# Patient Record
Sex: Male | Born: 1950
Health system: Southern US, Community
[De-identification: ages and names within clinical notes are randomized; demographics above are authoritative.]

## PROBLEM LIST (undated history)

## (undated) DIAGNOSIS — J9691 Respiratory failure, unspecified with hypoxia: Secondary | ICD-10-CM

## (undated) DIAGNOSIS — Z942 Lung transplant status: Secondary | ICD-10-CM

## (undated) DIAGNOSIS — IMO0001 Reserved for inherently not codable concepts without codable children: Secondary | ICD-10-CM

## (undated) DIAGNOSIS — E119 Type 2 diabetes mellitus without complications: Secondary | ICD-10-CM

## (undated) DIAGNOSIS — M1990 Unspecified osteoarthritis, unspecified site: Secondary | ICD-10-CM

## (undated) DIAGNOSIS — G4733 Obstructive sleep apnea (adult) (pediatric): Secondary | ICD-10-CM

## (undated) DIAGNOSIS — J189 Pneumonia, unspecified organism: Secondary | ICD-10-CM

## (undated) DIAGNOSIS — E785 Hyperlipidemia, unspecified: Secondary | ICD-10-CM

## (undated) DIAGNOSIS — K449 Diaphragmatic hernia without obstruction or gangrene: Secondary | ICD-10-CM

## (undated) DIAGNOSIS — N186 End stage renal disease: Secondary | ICD-10-CM

## (undated) DIAGNOSIS — I359 Nonrheumatic aortic valve disorder, unspecified: Secondary | ICD-10-CM

## (undated) DIAGNOSIS — I10 Essential (primary) hypertension: Secondary | ICD-10-CM

## (undated) DIAGNOSIS — K219 Gastro-esophageal reflux disease without esophagitis: Secondary | ICD-10-CM

## (undated) DIAGNOSIS — Z992 Dependence on renal dialysis: Secondary | ICD-10-CM

## (undated) DIAGNOSIS — IMO0002 Reserved for concepts with insufficient information to code with codable children: Secondary | ICD-10-CM

## (undated) DIAGNOSIS — N289 Disorder of kidney and ureter, unspecified: Secondary | ICD-10-CM

## (undated) DIAGNOSIS — J8410 Pulmonary fibrosis, unspecified: Secondary | ICD-10-CM

## (undated) DIAGNOSIS — D126 Benign neoplasm of colon, unspecified: Secondary | ICD-10-CM

## (undated) HISTORY — DX: Obstructive sleep apnea (adult) (pediatric): G47.33

## (undated) HISTORY — DX: Benign neoplasm of colon, unspecified: D12.6

## (undated) HISTORY — PX: LUNG TRANSPLANT, SINGLE: SHX705

## (undated) HISTORY — DX: Reserved for concepts with insufficient information to code with codable children: IMO0002

## (undated) HISTORY — DX: Diaphragmatic hernia without obstruction or gangrene: K44.9

## (undated) HISTORY — DX: Nonrheumatic aortic valve disorder, unspecified: I35.9

## (undated) HISTORY — DX: Unspecified osteoarthritis, unspecified site: M19.90

## (undated) HISTORY — DX: Essential (primary) hypertension: I10

## (undated) HISTORY — DX: Hyperlipidemia, unspecified: E78.5

## (undated) HISTORY — DX: Gastro-esophageal reflux disease without esophagitis: K21.9

---

## 2001-02-21 ENCOUNTER — Ambulatory Visit (HOSPITAL_BASED_OUTPATIENT_CLINIC_OR_DEPARTMENT_OTHER): Admission: RE | Admit: 2001-02-21 | Discharge: 2001-02-21 | Payer: Self-pay | Admitting: Family Medicine

## 2001-04-16 ENCOUNTER — Ambulatory Visit (HOSPITAL_BASED_OUTPATIENT_CLINIC_OR_DEPARTMENT_OTHER): Admission: RE | Admit: 2001-04-16 | Discharge: 2001-04-16 | Payer: Self-pay | Admitting: Family Medicine

## 2003-10-28 ENCOUNTER — Emergency Department (HOSPITAL_COMMUNITY): Admission: EM | Admit: 2003-10-28 | Discharge: 2003-10-28 | Payer: Self-pay

## 2005-08-19 ENCOUNTER — Encounter: Payer: Self-pay | Admitting: Pulmonary Disease

## 2008-06-06 HISTORY — PX: LUNG BIOPSY: SHX232

## 2008-09-11 ENCOUNTER — Encounter: Payer: Self-pay | Admitting: Pulmonary Disease

## 2008-09-17 ENCOUNTER — Encounter: Payer: Self-pay | Admitting: Pulmonary Disease

## 2008-09-22 ENCOUNTER — Encounter: Admission: RE | Admit: 2008-09-22 | Discharge: 2008-09-22 | Payer: Self-pay | Admitting: Family Medicine

## 2008-10-09 DIAGNOSIS — M1990 Unspecified osteoarthritis, unspecified site: Secondary | ICD-10-CM | POA: Insufficient documentation

## 2008-10-09 DIAGNOSIS — G4733 Obstructive sleep apnea (adult) (pediatric): Secondary | ICD-10-CM | POA: Insufficient documentation

## 2008-10-09 DIAGNOSIS — I10 Essential (primary) hypertension: Secondary | ICD-10-CM | POA: Insufficient documentation

## 2008-10-09 DIAGNOSIS — K219 Gastro-esophageal reflux disease without esophagitis: Secondary | ICD-10-CM | POA: Insufficient documentation

## 2008-10-09 DIAGNOSIS — D126 Benign neoplasm of colon, unspecified: Secondary | ICD-10-CM

## 2008-10-09 DIAGNOSIS — E785 Hyperlipidemia, unspecified: Secondary | ICD-10-CM

## 2008-10-09 DIAGNOSIS — IMO0002 Reserved for concepts with insufficient information to code with codable children: Secondary | ICD-10-CM | POA: Insufficient documentation

## 2008-10-09 DIAGNOSIS — K449 Diaphragmatic hernia without obstruction or gangrene: Secondary | ICD-10-CM | POA: Insufficient documentation

## 2008-10-09 DIAGNOSIS — I359 Nonrheumatic aortic valve disorder, unspecified: Secondary | ICD-10-CM | POA: Insufficient documentation

## 2008-10-10 ENCOUNTER — Ambulatory Visit: Payer: Self-pay | Admitting: Pulmonary Disease

## 2008-10-10 DIAGNOSIS — J8410 Pulmonary fibrosis, unspecified: Secondary | ICD-10-CM

## 2008-10-29 ENCOUNTER — Encounter: Payer: Self-pay | Admitting: Pulmonary Disease

## 2008-11-06 ENCOUNTER — Ambulatory Visit: Payer: Self-pay | Admitting: Pulmonary Disease

## 2009-02-18 ENCOUNTER — Telehealth: Payer: Self-pay | Admitting: Pulmonary Disease

## 2009-02-25 ENCOUNTER — Ambulatory Visit: Payer: Self-pay | Admitting: Pulmonary Disease

## 2009-02-26 ENCOUNTER — Encounter: Payer: Self-pay | Admitting: Pulmonary Disease

## 2009-03-11 ENCOUNTER — Ambulatory Visit: Payer: Self-pay | Admitting: Thoracic Surgery

## 2009-03-17 ENCOUNTER — Encounter: Admission: RE | Admit: 2009-03-17 | Discharge: 2009-03-17 | Payer: Self-pay | Admitting: Thoracic Surgery

## 2009-04-03 ENCOUNTER — Ambulatory Visit: Payer: Self-pay | Admitting: Thoracic Surgery

## 2009-04-07 ENCOUNTER — Encounter: Payer: Self-pay | Admitting: Thoracic Surgery

## 2009-04-07 ENCOUNTER — Ambulatory Visit: Payer: Self-pay | Admitting: Thoracic Surgery

## 2009-04-07 ENCOUNTER — Inpatient Hospital Stay (HOSPITAL_COMMUNITY): Admission: RE | Admit: 2009-04-07 | Discharge: 2009-04-11 | Payer: Self-pay | Admitting: Thoracic Surgery

## 2009-04-07 ENCOUNTER — Ambulatory Visit: Payer: Self-pay | Admitting: Emergency Medicine

## 2009-04-07 ENCOUNTER — Encounter: Payer: Self-pay | Admitting: Pulmonary Disease

## 2009-04-21 ENCOUNTER — Encounter: Admission: RE | Admit: 2009-04-21 | Discharge: 2009-04-21 | Payer: Self-pay | Admitting: Thoracic Surgery

## 2009-04-21 ENCOUNTER — Ambulatory Visit: Payer: Self-pay | Admitting: Thoracic Surgery

## 2009-04-28 ENCOUNTER — Ambulatory Visit: Payer: Self-pay | Admitting: Pulmonary Disease

## 2009-04-28 LAB — CONVERTED CEMR LAB
Anti Nuclear Antibody(ANA): NEGATIVE
Sed Rate: 43 mm/hr — ABNORMAL HIGH (ref 0–22)

## 2009-06-03 ENCOUNTER — Ambulatory Visit: Payer: Self-pay | Admitting: Thoracic Surgery

## 2009-06-03 ENCOUNTER — Encounter: Admission: RE | Admit: 2009-06-03 | Discharge: 2009-06-03 | Payer: Self-pay | Admitting: Thoracic Surgery

## 2009-10-26 ENCOUNTER — Ambulatory Visit: Payer: Self-pay | Admitting: Pulmonary Disease

## 2009-10-26 DIAGNOSIS — J8410 Pulmonary fibrosis, unspecified: Secondary | ICD-10-CM

## 2009-10-27 ENCOUNTER — Ambulatory Visit: Payer: Self-pay | Admitting: Pulmonary Disease

## 2009-11-06 ENCOUNTER — Telehealth: Payer: Self-pay | Admitting: Pulmonary Disease

## 2010-07-06 NOTE — Progress Notes (Signed)
Summary: pft results  Phone Note Call from Patient Call back at [phone]   Caller: Patient Call For: Latressa Harries Summary of Call: Returning Megan's call. Initial call taken by: Netta Neat,  November 06, 2009 9:18 AM  Follow-up for Phone Call        [facility]. [place] Bing CMA  November 06, 2009 9:34 AM  pt returned call from triage. Cooper Render, CNA  November 06, 2009 11:06 AM  pt advised of pft results per append. [place] Bing CMA  November 06, 2009 11:08 AM

## 2010-07-06 NOTE — Miscellaneous (Signed)
Summary: Orders Update pft charges  Clinical Lists Changes  Orders: Added new Service order of Carbon Monoxide diffusing w/capacity (94720) - Signed Added new Service order of Lung Volumes (94240) - Signed Added new Service order of Spirometry (Pre & Post) (94060) - Signed 

## 2010-07-06 NOTE — Assessment & Plan Note (Signed)
Summary: rov for pulmonary fibrosis   Copy to:  Arlyce Dice Primary Provider/Referring Provider:  Aura Dials  CC:  Pt is here for a 6 month f/u appt.  Pt states breathing is unchanged from last visit.  Pt states he will occ cough up scant amount of clear sputum.  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  He is doing very well, with no change in his exertional tolerance.  He is staying very active.  He has minimal dry cough, and no congestion.  He denies LE edema  Current Medications (verified): 1)  Omeprazole 40 Mg Cpdr (Omeprazole) .... Take 1 Tablet By Mouth Once A Day As Needed 2)  Exforge Hct 5-160-25 Mg Tabs (Amlodipine-Valsartan-Hctz) .... Take 1 Tablet By Mouth Once A Day 3)  Simvastatin 40 Mg Tabs (Simvastatin) .... Take 1 Tablet By Mouth Once A Day 4)  Aspirin Low Dose 81 Mg Tabs (Aspirin) .... Take 1 Tablet By Mouth Once A Day 5)  Multivitamins  Tabs (Multiple Vitamin) .... Take 1 Tablet By Mouth Once A Day 6)  Vitamin D 1000 Unit Tabs (Cholecalciferol) .... Take 1 Tablet By Mouth Once A Day  Allergies (verified): 1)  ! Levaquin  Review of Systems       The patient complains of shortness of breath with activity and non-productive cough.  The patient denies shortness of breath at rest, productive cough, coughing up blood, chest pain, irregular heartbeats, acid heartburn, indigestion, loss of appetite, weight change, abdominal pain, difficulty swallowing, sore throat, tooth/dental problems, headaches, nasal congestion/difficulty breathing through nose, sneezing, itching, ear ache, anxiety, depression, hand/feet swelling, joint stiffness or pain, rash, change in color of mucus, and fever.    Vital Signs:  Patient profile:   60 year old male Height:      63 inches Weight:      171 pounds BMI:     30.40 O2 Sat:      98 % on Room air Temp:     98.0 degrees F oral Pulse rate:   74 / minute BP sitting:   164 / 98   (left arm) Cuff size:   regular  Vitals Entered By: Matthew Folks LPN (May 23, 624THL 579FGE AM)  O2 Flow:  Room air CC: Pt is here for a 6 month f/u appt.  Pt states breathing is unchanged from last visit.  Pt states he will occ cough up scant amount of clear sputum.   Comments Medications reviewed with patient Matthew Folks LPN  May 23, 624THL 579FGE AM    Physical Exam  General:  wd male in nad Lungs:  crackles in bases, no wheezing Heart:  rrr, no mrg Extremities:  no edema or cyanosis Neurologic:  alert and oriented,moves all 4.   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  I have talked with him again about various treatment options, including surveillance, investigational studies, poorly effective treatment with prednisone/imuran, and transplant.  He is doing very well currently, and has not seen a decline in his exertional tolerance.  He would like to continue with surveillance for now, and will continue with yearly cxr/pfts to monitor for clinical worsening.  Medications Added to Medication List This Visit: 1)  Omeprazole 40 Mg Cpdr (Omeprazole) .... Take 1 tablet by mouth once a day as needed 2)  Vitamin D  1000 Unit Tabs (Cholecalciferol) .... Take 1 tablet by mouth once a day  Other Orders: Est. Patient Level III [id number]) T-2 View CXR ([id number]) Pulmonary Referral (Pulmonary)  Patient Instructions: 1)  will check cxr today, and let you know the results. 2)  will schedule for pfts at your convenience 3)  continue to stay active, let me know if symptoms worsen 4)  followup with me in 22mos  Appended Document: rov for pulmonary fibrosis megan, please let pt know there has been only a small decline in lung function from last year...will continue to follow.  Appended Document: rov for pulmonary fibrosis [id number]  Appended Document: rov for pulmonary  fibrosis pt advised.

## 2010-09-08 LAB — CBC
HCT: 40.9 % (ref 39.0–52.0)
HCT: 42.7 % (ref 39.0–52.0)
MCHC: 34.2 g/dL (ref 30.0–36.0)
MCV: 87.3 fL (ref 78.0–100.0)
MCV: 87.8 fL (ref 78.0–100.0)
Platelets: 267 10*3/uL (ref 150–400)
RBC: 4.69 MIL/uL (ref 4.22–5.81)
RDW: 14.6 % (ref 11.5–15.5)
WBC: 10.1 10*3/uL (ref 4.0–10.5)

## 2010-09-08 LAB — FUNGUS CULTURE W SMEAR

## 2010-09-08 LAB — BASIC METABOLIC PANEL
BUN: 12 mg/dL (ref 6–23)
Chloride: 101 mEq/L (ref 96–112)
Glucose, Bld: 183 mg/dL — ABNORMAL HIGH (ref 70–99)
Potassium: 3.4 mEq/L — ABNORMAL LOW (ref 3.5–5.1)

## 2010-09-08 LAB — COMPREHENSIVE METABOLIC PANEL
AST: 55 U/L — ABNORMAL HIGH (ref 0–37)
BUN: 9 mg/dL (ref 6–23)
CO2: 27 mEq/L (ref 19–32)
Calcium: 8.8 mg/dL (ref 8.4–10.5)
Creatinine, Ser: 1.16 mg/dL (ref 0.4–1.5)
GFR calc Af Amer: >60 mL/min (ref >60)
GFR calc non Af Amer: >60 mL/min (ref >60)
Glucose, Bld: 152 mg/dL — ABNORMAL HIGH (ref 70–99)

## 2010-09-08 LAB — BLOOD GAS, ARTERIAL
Acid-Base Excess: 0.5 mmol/L (ref 0.0–2.0)
FIO2: 0.21 %
Patient temperature: 98.6
TCO2: 26.1 mmol/L (ref 0–100)
pCO2 arterial: 41.3 mmHg (ref 35.0–45.0)
pH, Arterial: 7.397 (ref 7.350–7.450)

## 2010-09-08 LAB — AFB CULTURE WITH SMEAR (NOT AT ARMC): Acid Fast Smear: NONE SEEN

## 2010-09-09 LAB — TYPE AND SCREEN: Antibody Screen: NEGATIVE

## 2010-09-09 LAB — CBC
HCT: 47.2 % (ref 39.0–52.0)
Hemoglobin: 15.9 g/dL (ref 13.0–17.0)
MCV: 87.7 fL (ref 78.0–100.0)
Platelets: 283 10*3/uL (ref 150–400)
WBC: 7.3 10*3/uL (ref 4.0–10.5)

## 2010-09-09 LAB — APTT: aPTT: 38 seconds — ABNORMAL HIGH (ref 24–37)

## 2010-09-09 LAB — COMPREHENSIVE METABOLIC PANEL
Albumin: 3.8 g/dL (ref 3.5–5.2)
Alkaline Phosphatase: 78 U/L (ref 39–117)
BUN: 12 mg/dL (ref 6–23)
Chloride: 105 mEq/L (ref 96–112)
Creatinine, Ser: 1.12 mg/dL (ref 0.4–1.5)
GFR calc non Af Amer: >60 mL/min (ref >60)
Glucose, Bld: 97 mg/dL (ref 70–99)
Potassium: 4.3 mEq/L (ref 3.5–5.1)
Total Bilirubin: 0.8 mg/dL (ref 0.3–1.2)

## 2010-09-09 LAB — URINALYSIS, ROUTINE W REFLEX MICROSCOPIC
Bilirubin Urine: NEGATIVE
Hgb urine dipstick: NEGATIVE
Ketones, ur: NEGATIVE mg/dL
Nitrite: NEGATIVE
Protein, ur: NEGATIVE mg/dL
Specific Gravity, Urine: 1.018 (ref 1.005–1.030)
Urobilinogen, UA: 1 mg/dL (ref 0.0–1.0)

## 2010-09-09 LAB — PROTIME-INR
INR: 0.92 (ref 0.00–1.49)
Prothrombin Time: 12.3 seconds (ref 11.6–15.2)

## 2010-10-19 NOTE — Letter (Signed)
April 21, 2009   Kathee Delton, MD, FCCP  [address], [place] [address]   Re:  MIKIE, CAVENY             DOB:  1951-01-26   Dear Lanny Hurst,   I saw the patient back.  We removed his sutures.  His incisions are well  healed.  He is breathing much better than he was previously.  His blood  pressure was up 182/67, pulse was 78, respirations 18, sats were 93%.  Overall, he is was doing satisfactory.  We plan to return him on  April 24, 2009.  I did give him an appointment to see Dr. Gwenette Greet.  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  I did  explain to him that this carried with him a very poor prognosis and that  he would probably need to be referred to [facility] for consideration for  possible transplantation.   Nicanor Alcon, M.D.  Electronically Signed   DPB/MEDQ  D:  04/21/2009  T:  04/22/2009  Job:  [id number]

## 2010-10-19 NOTE — Letter (Signed)
March 11, 2009   Kathee Delton, MD,FCCP  [address], [place] [address]   Re:  [name], [name]             DOB:  [birth year]   Dear Lanny Hurst,   I appreciate the opportunity of seeing the patient.  This 60 year old  African American male who works for [employer] has had progressive shortness  of breath and had a CT scan done in April after abnormal chest x-ray  that showed evidence of interstitial lung disease, right greater than  left.  He was continuing to get shortness of breath with exertion.  Followup chest x-ray showed no major change since April, and he is  referred here for evaluation for evaluation for a VATS lung biopsy,  which we discussed in great detail.   PAST MEDICAL HISTORY:   MEDICATIONS:  Aspirin 81 mg daily, simvastatin 40 mg a day, omeprazole  20 mg daily, and Exforge/HCTZ 5-160-25 daily.   ALLERGIES:  He is allergic to Levaquin.   FAMILY HISTORY:  Noncontributory.  Although, he did have a father who  had some amount of scarring on his lungs.   SOCIAL HISTORY:  She is married, has 1 child.  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  Quit smoking few years ago.  He does not drink alcohol on  a regular basis.   REVIEW OF SYSTEMS:  CONSTITUTIONAL:  He is 170 pounds, he is 5 foot 3.  In general, his weight has been stable.  CARDIAC:  He has shortness of breath with exertion.  No angina or atrial  fibrillation.  PULMONARY:  No hemoptysis, bronchitis, asthma, or wheezing.  GI:  He has GERD and has a lot of some dysphagia.  GU:  No kidney disease, dysuria, or frequent urination.  VASCULAR:  No claudication, DVT, TIA's.  NEUROLOGIC:  No dizziness, headaches, blackouts, or seizures.  MUSCULOSKELETAL:  He has some low back pain and neck pain.  They was  told this was arthritis.  PSYCHIATRIC:  No depression or nervous.  EYE/ENT:  No change in his eyesight or hearing but has some ringing in  his ears.  HEMATOLOGIC:  No problems with bleeding, clotting disorders,  or anemia.   PHYSICAL EXAMINATION:  General:  His blood pressure is 200/100, pulse  76, respirations 18, sats were 94%.  Head, Eyes, Ears, Nose, and Throat:  Unremarkable.  Neck:  Supple without thyromegaly.  There is no  supraclavicular or axillary adenopathy.  Chest:  Clear to auscultation  or percussion.  Heart:  Regular sinus rhythm.  No murmurs.  Abdomen:  Soft.  There is no hepatosplenomegaly.  Extremities:  Pulses 2+.  There  is no clubbing or edema.  Neurologic:  He is oriented x3.  Sensory and  motor intact.  Cranial nerves intact.   I feel that he does have interstitial lung disease.  I will plan to  order another CT scan, so I can have a recent CT scan to decide on which  side to do this biopsies right now.  From his chest x-ray, I will  considering doing a right VATS and lung biopsy.  We will generally  schedule this for later in the month.  I discussed all the risk and  benefits of the biopsies and adequately they understand the risk and the  benefits.   I appreciate the opportunity of seeing the patient.    Sincerely,   Nicanor Alcon, M.D.  Electronically Signed   DPB/MEDQ  D:  03/11/2009  T:  03/12/2009  Job:  [id number]

## 2010-10-25 ENCOUNTER — Encounter: Payer: Self-pay | Admitting: Pulmonary Disease

## 2010-11-02 ENCOUNTER — Encounter: Payer: Self-pay | Admitting: Pulmonary Disease

## 2010-11-02 ENCOUNTER — Ambulatory Visit (INDEPENDENT_AMBULATORY_CARE_PROVIDER_SITE_OTHER): Payer: Managed Care, Other (non HMO) | Admitting: Pulmonary Disease

## 2010-11-02 DIAGNOSIS — J84111 Idiopathic interstitial pneumonia, not otherwise specified: Secondary | ICD-10-CM

## 2010-11-02 NOTE — Patient Instructions (Signed)
pick up a disc with your cxr and bring by for my review. Will setup for breathing studies, and will call you with results.  followup with me in 81mos.

## 2010-11-02 NOTE — Progress Notes (Signed)
  Subjective:    Patient ID: [name], male    DOB: November 16, 1950, 60 y.o.   MRN: [id number]  HPI The pt comes in today for f/u of his known PF.  He feels his exertional tolerance is near his usual baseline, and is trying to stay as active as possible.  He has a mild cough, worse at night, that sounds more due to PND than anything else.  I have asked him to be more consistent with his otc allergy meds.    Review of Systems  Constitutional: Negative for fever and unexpected weight change.  HENT: Positive for congestion, sore throat, sneezing and sinus pressure. Negative for ear pain, nosebleeds, rhinorrhea, trouble swallowing, dental problem and postnasal drip.   Eyes: Negative for redness and itching.  Respiratory: Positive for cough, chest tightness, shortness of breath and wheezing.   Cardiovascular: Positive for palpitations and leg swelling.  Gastrointestinal: Negative for nausea and vomiting.  Genitourinary: Negative for dysuria.  Musculoskeletal: Positive for joint swelling.  Skin: Negative for rash.  Neurological: Positive for headaches.  Hematological: Does not bruise/bleed easily.  Psychiatric/Behavioral: Negative for dysphoric mood. The patient is not nervous/anxious.        Objective:   Physical Exam Wd male in nad Chest with crackles in bases about 1/3 up bilat.  No wheezing noted.  Cor with rrr LE without edema, no cyanosis noted.  Alert, oriented, moves all 4        Assessment & Plan:

## 2010-11-02 NOTE — Assessment & Plan Note (Addendum)
The pt sounds fairly stable from his last visit at least subjectively.  He needs to have his f/u cxr and pfts to compare to last year. If he has further objective decline, would consider repeating his autoimmune panel, and referring to ASCEND trial if he qualifies.

## 2010-11-10 ENCOUNTER — Telehealth: Payer: Self-pay | Admitting: Pulmonary Disease

## 2010-11-10 ENCOUNTER — Encounter: Payer: Self-pay | Admitting: Pulmonary Disease

## 2010-11-10 ENCOUNTER — Ambulatory Visit (INDEPENDENT_AMBULATORY_CARE_PROVIDER_SITE_OTHER): Payer: Managed Care, Other (non HMO) | Admitting: Pulmonary Disease

## 2010-11-10 VITALS — BP 128/86 | HR 80 | Temp 98.3°F | Ht 63.0 in | Wt 176.8 lb

## 2010-11-10 DIAGNOSIS — R05 Cough: Secondary | ICD-10-CM

## 2010-11-10 DIAGNOSIS — R059 Cough, unspecified: Secondary | ICD-10-CM | POA: Insufficient documentation

## 2010-11-10 MED ORDER — LORATADINE 10 MG PO TABS: 10.0000 mg | ORAL_TABLET | Freq: Every day | ORAL | Status: DC

## 2010-11-10 MED ORDER — MONTELUKAST SODIUM 10 MG PO TABS: ORAL_TABLET | ORAL | Status: DC

## 2010-11-10 MED ORDER — FLUTICASONE PROPIONATE 50 MCG/ACT NA SUSP: 1.0000 | Freq: Every day | NASAL | Status: DC

## 2010-11-10 MED ORDER — DIPHENHYDRAMINE HCL 25 MG PO CAPS: ORAL_CAPSULE | ORAL | Status: DC

## 2010-11-10 NOTE — Progress Notes (Signed)
Subjective:    Patient ID: [name], male    DOB: 1951-05-11, 60 y.o.   MRN: [id number]  HPI 60 yo male with worsening cough and rhinitis in setting of pulmonary fibrosis.  He was change from claritin to zyrtec.  He sinuses and cough got worse.  He has post-nasal drip and globus sensation.  He has a cough with clear sputum.  He denies fever, wheeze, hemoptysis, ear pain, nose bleed, or reflux.  He has not had sick exposure.  He has been using nasal rinse and flonase.  He felt that claritin worked better.  CXR from April 2012 reviewed and showed no change in fibrosis.  Past Medical History  Diagnosis Date  . Benign neoplasm of colon   . Aortic valve disorders   . Osteoarthrosis, unspecified whether generalized or localized, unspecified site   . Degeneration of intervertebral disc, site unspecified   . Other and unspecified hyperlipidemia   . Unspecified essential hypertension   . Obstructive sleep apnea (adult) (pediatric)   . Esophageal reflux   . 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 Pancreatic cancer Brother   . Heart disease Father      History   Social History  . Marital Status: Married    Spouse Name: N/A    Number of Children: N/A  . Years of Education: N/A   Occupational History  . [employer]    Social History Main Topics  . Smoking status: Former Smoker -- 0.3 packs/day for 20 years    Types: Cigarettes    Quit date: 06/06/2006  . Smokeless tobacco: Never Used  . Alcohol Use: No  . Drug Use: Not on file  . Sexually Active: Not on file   Other Topics Concern  . Not on file   Social History Narrative  . No narrative on file     Allergies  Allergen Reactions  . Levofloxacin      Outpatient Prescriptions Prior to Visit  Medication Sig Dispense Refill  . amLODipine-valsartan (EXFORGE) 5-160 MG per tablet Take 1 tablet by mouth daily.        Marland Kitchen aspirin 81 MG tablet  Take 81 mg by mouth daily.        . Cholecalciferol (VITAMIN D) 1000 UNITS capsule Take 1,000 Units by mouth daily.        . Multiple Vitamin (MULTIVITAMIN) capsule Take 1 capsule by mouth daily.        Marland Kitchen omeprazole (PRILOSEC) 40 MG capsule Take 40 mg by mouth daily.        . simvastatin (ZOCOR) 40 MG tablet Take 40 mg by mouth at bedtime.         Review of Systems     Objective:   Physical Exam  BP 128/86  Pulse 80  Temp(Src) 98.3 F (36.8 C) (Oral)  Ht 5\' 3"  (1.6 m)  Wt 176 lb 12.8 oz (80.196 kg)  BMI 31.32 kg/m2  SpO2 97%  General - Obese HEENT - clear sinus drainage, no sinus tenderness, mild erythema posterior pharynx, no oral exudate, no LAN Cardiac - s1s2 regular, no murmur Chest - basilar crackles, no wheeze Abd - soft, nontender Ext - no edema Neuro - normal strength     Assessment & Plan:   Cough Much of this is likely related to his sinuses with post-nasal drip.  Will have him resume claritin, and use benadryl at night for  5 days.  He is to continue sinus rinse and flonase.  Will have him also use singulair for one week, then as needed.  I don't think he needs an additional chest xray, antibiotics, or prednisone at this time.  Advised him to keep his appointment for PFT.    Updated Medication List Outpatient Encounter Prescriptions as of 11/10/2010  Medication Sig Dispense Refill  . amLODipine-valsartan (EXFORGE) 5-160 MG per tablet Take 1 tablet by mouth daily.        Marland Kitchen aspirin 81 MG tablet Take 81 mg by mouth daily.        . Cholecalciferol (VITAMIN D) 1000 UNITS capsule Take 1,000 Units by mouth daily.        . Multiple Vitamin (MULTIVITAMIN) capsule Take 1 capsule by mouth daily.        Marland Kitchen omeprazole (PRILOSEC) 40 MG capsule Take 40 mg by mouth daily.        . simvastatin (ZOCOR) 40 MG tablet Take 40 mg by mouth at bedtime.        . diphenhydrAMINE (BENADRYL) 25 mg capsule One pill at night for 5 days, then as needed  24 capsule  2  . fluticasone (FLONASE)  50 MCG/ACT nasal spray Place 1 spray into the nose daily.  16 g  2  . loratadine (CLARITIN) 10 MG tablet Take 1 tablet (10 mg total) by mouth daily.  30 tablet  2  . montelukast (SINGULAIR) 10 MG tablet One pill at night for one week, then as needed  30 tablet  2

## 2010-11-10 NOTE — Patient Instructions (Signed)
Stop zyrtec Claritin 10 mg daily in morning Benadryl 25 mg at night for 5 days, then as needed Singulair 10 mg at night for 7 days, then as needed Continue using sinus rinse and flonase Follow up with Dr. Gwenette Greet as previously scheduled

## 2010-11-10 NOTE — Assessment & Plan Note (Signed)
Much of this is likely related to his sinuses with post-nasal drip.  Will have him resume claritin, and use benadryl at night for 5 days.  He is to continue sinus rinse and flonase.  Will have him also use singulair for one week, then as needed.  I don't think he needs an additional chest xray, antibiotics, or prednisone at this time.  Advised him to keep his appointment for PFT.

## 2010-11-10 NOTE — Telephone Encounter (Signed)
Called, spoke with pt.  States he started zyrtec on 5/29.  The next morning he noticed SOB with exertion.  States he thought this was coming from the zyrtec so he stopped it on 5/31.  Pt feels SOB is getting worse and not improving.  OV scheduled for today with Dr. Halford Chessman at 3pm -- pt aware and advised to call 911 or go to ED if sxs worsen prior to OV -- he verbalized understanding of this.

## 2010-11-15 ENCOUNTER — Ambulatory Visit (INDEPENDENT_AMBULATORY_CARE_PROVIDER_SITE_OTHER): Payer: Managed Care, Other (non HMO) | Admitting: Pulmonary Disease

## 2010-11-15 DIAGNOSIS — J84111 Idiopathic interstitial pneumonia, not otherwise specified: Secondary | ICD-10-CM

## 2010-11-15 LAB — PULMONARY FUNCTION TEST

## 2010-11-15 NOTE — Progress Notes (Signed)
PFT done today. 

## 2010-11-19 ENCOUNTER — Telehealth: Payer: Self-pay | Admitting: Pulmonary Disease

## 2010-11-19 NOTE — Telephone Encounter (Signed)
Called and spoke with pt's wife.  Informed her Happy out of office this week and will return next week.  Wife ok to wait for response from [facility] .

## 2010-11-23 ENCOUNTER — Telehealth: Payer: Self-pay | Admitting: Pulmonary Disease

## 2010-11-23 NOTE — Telephone Encounter (Signed)
See phone note dated 11/23/10

## 2010-11-23 NOTE — Telephone Encounter (Signed)
Spoke with pt's spouse. She is concerned pt has not received his cxr and pft results. I advised will forward msg to [facility] for these results. Pls advise, thanks!

## 2010-11-24 NOTE — Telephone Encounter (Signed)
Let pt know that I have been out of town. Tell him his cxr is completely stable, and his lung size/capacity is actually better than last year.  One test that looks at how the oxygen gets from air sac into bloodstream is a little worse from last year.  Overall, everything is stable.  As long as he is doing ok, will stick with plan. If he feels his breathing is worsening, needs to call me.

## 2010-11-24 NOTE — Telephone Encounter (Signed)
Spoke with pt and notified of results per Moroni. Pt verbalized understanding and denied any questions. Pt aware [facility] was out of town last wk.

## 2011-05-04 ENCOUNTER — Encounter: Payer: Self-pay | Admitting: Pulmonary Disease

## 2011-05-04 ENCOUNTER — Ambulatory Visit (INDEPENDENT_AMBULATORY_CARE_PROVIDER_SITE_OTHER): Payer: Managed Care, Other (non HMO) | Admitting: Pulmonary Disease

## 2011-05-04 DIAGNOSIS — J84111 Idiopathic interstitial pneumonia, not otherwise specified: Secondary | ICD-10-CM

## 2011-05-04 DIAGNOSIS — J019 Acute sinusitis, unspecified: Secondary | ICD-10-CM | POA: Insufficient documentation

## 2011-05-04 MED ORDER — CEFDINIR 300 MG PO CAPS: ORAL_CAPSULE | ORAL | Status: DC

## 2011-05-04 NOTE — Assessment & Plan Note (Signed)
The patient's wife has noticed increasing cough most recently, and the patient is describing a lot of postnasal drip with discolored mucus production.  He is also having a lot of sinus congestion and discharge from his nose.  I suspect he is developing an acute sinusitis, and therefore will treat him with a course of antibiotics and more aggressive nasal hygiene.

## 2011-05-04 NOTE — Patient Instructions (Signed)
Try chlorpheniramine 8mg  at bedtime for your postnasal drip in the place of benedryl and zyrtec. Use salt water spray before bedtime to clean out nose.  Can do this during the day as well Will treat with omnicef for possible sinusitis. Will see you back in 17mos with chest xray and breathing tests the same day.

## 2011-05-04 NOTE — Progress Notes (Signed)
  Subjective:    Patient ID: [name], male    DOB: [birth year], 60 y.o.   MRN: [id number]  HPI The patient comes in today for followup of his known interstitial disease, most likely due to idiopathic pulmonary fibrosis.  His breathing has been at a stable baseline, and his pulmonary function studies overall have been stable.  He denies any significant chest congestion or worsening shortness of breath outside of his usual range.  He has noted increasing cough with discolored mucus and increased postnasal drip.  He has had sinus congestion and blood from his nose with discolored mucus as well.   Review of Systems  Constitutional: Negative for fever and unexpected weight change.  HENT: Positive for nosebleeds, congestion, rhinorrhea, sneezing and sinus pressure. Negative for ear pain, sore throat, trouble swallowing, dental problem and postnasal drip.   Eyes: Positive for itching. Negative for redness.  Respiratory: Positive for cough and shortness of breath. Negative for chest tightness and wheezing.   Cardiovascular: Negative for palpitations and leg swelling.  Gastrointestinal: Negative for nausea and vomiting.  Genitourinary: Negative for dysuria.  Musculoskeletal: Negative for joint swelling.  Skin: Negative for rash.  Neurological: Negative for headaches.  Hematological: Does not bruise/bleed easily.  Psychiatric/Behavioral: Negative for dysphoric mood. The patient is not nervous/anxious.        Objective:   Physical Exam Well-developed male in no acute distress Nose with edematous and erythematous mucosa, bleeding and crusting in the left nostril Oropharynx clear without exudates Chest with crackles one third of the way up bilaterally, no wheezing Cardiac with regular rate and rhythm Lower extremities with no edema, no cyanosis noted Alert and oriented, moves all 4 extremities.       Assessment & Plan:

## 2011-05-04 NOTE — Assessment & Plan Note (Signed)
The pt is stable from a pulmonary standpoint, and will therefore continue to closely monitor.  Will see back in 89mos with cxr and pfts.  He is to call if breathing worsens in the interim.

## 2011-06-03 ENCOUNTER — Ambulatory Visit (INDEPENDENT_AMBULATORY_CARE_PROVIDER_SITE_OTHER): Payer: Managed Care, Other (non HMO)

## 2011-06-03 DIAGNOSIS — R509 Fever, unspecified: Secondary | ICD-10-CM

## 2011-11-01 ENCOUNTER — Other Ambulatory Visit: Payer: Self-pay | Admitting: Pulmonary Disease

## 2011-11-01 DIAGNOSIS — J849 Interstitial pulmonary disease, unspecified: Secondary | ICD-10-CM

## 2011-11-02 ENCOUNTER — Ambulatory Visit (INDEPENDENT_AMBULATORY_CARE_PROVIDER_SITE_OTHER)
Admission: RE | Admit: 2011-11-02 | Discharge: 2011-11-02 | Disposition: A | Discharge status: Home / Self Care | Payer: Managed Care, Other (non HMO) | Source: Ambulatory Visit | Attending: Pulmonary Disease | Admitting: Pulmonary Disease

## 2011-11-02 ENCOUNTER — Ambulatory Visit (INDEPENDENT_AMBULATORY_CARE_PROVIDER_SITE_OTHER): Payer: Managed Care, Other (non HMO) | Admitting: Pulmonary Disease

## 2011-11-02 ENCOUNTER — Encounter: Payer: Self-pay | Admitting: Pulmonary Disease

## 2011-11-02 ENCOUNTER — Other Ambulatory Visit (INDEPENDENT_AMBULATORY_CARE_PROVIDER_SITE_OTHER): Payer: Managed Care, Other (non HMO)

## 2011-11-02 VITALS — BP 114/76 | HR 76 | Temp 97.8°F | Ht 64.0 in | Wt 179.0 lb

## 2011-11-02 DIAGNOSIS — J8410 Pulmonary fibrosis, unspecified: Secondary | ICD-10-CM

## 2011-11-02 DIAGNOSIS — J84111 Idiopathic interstitial pneumonia, not otherwise specified: Secondary | ICD-10-CM

## 2011-11-02 DIAGNOSIS — J849 Interstitial pulmonary disease, unspecified: Secondary | ICD-10-CM

## 2011-11-02 DIAGNOSIS — R05 Cough: Secondary | ICD-10-CM

## 2011-11-02 LAB — SEDIMENTATION RATE: Sed Rate: 27 mm/hr — ABNORMAL HIGH (ref 0–22)

## 2011-11-02 LAB — PULMONARY FUNCTION TEST

## 2011-11-02 LAB — CK TOTAL AND CKMB (NOT AT ARMC)
CK, MB: 1.5 ng/mL (ref 0.3–4.0)
Total CK: 124 U/L (ref 7–232)

## 2011-11-02 NOTE — Progress Notes (Signed)
  Subjective:    Patient ID: [name], male    DOB: 02/05/1951, 61 y.o.   MRN: [id number]  HPI The patient comes in today for followup of his known interstitial lung disease, with a history of UIP on biopsy.  He also has a history of a negative autoimmune panel in 2010.  The patient has had followup PFTs today which showed moderate to severe restriction, and clearly has worsened from his last set of studies.  His diffusion capacity is also severely reduced, and has worsened as well.  I have reviewed the studies with him and his wife, and answered all of their questions.  The patient has also seen an increase in his shortness of breath since the last visit.  He was started on inhalers by his primary physician, but does not feel they have helped.  He has no history of airflow obstruction by PFTs.   Review of Systems  Constitutional: Positive for fatigue. Negative for fever and unexpected weight change.  HENT: Negative.  Negative for ear pain, nosebleeds, congestion, sore throat, rhinorrhea, sneezing, trouble swallowing, dental problem, postnasal drip and sinus pressure.   Eyes: Negative.  Negative for redness and itching.  Respiratory: Positive for cough and shortness of breath. Negative for chest tightness and wheezing.   Cardiovascular: Negative.  Negative for palpitations and leg swelling.  Gastrointestinal: Negative.  Negative for nausea and vomiting.  Genitourinary: Negative.  Negative for dysuria.  Musculoskeletal: Negative.  Negative for joint swelling.  Skin: Negative.  Negative for rash.  Neurological: Negative.  Negative for headaches.  Hematological: Negative.  Does not bruise/bleed easily.  Psychiatric/Behavioral: Negative.  Negative for dysphoric mood. The patient is not nervous/anxious.        Objective:   Physical Exam Well-developed male in no acute distress Nose without purulence or discharge noted Oropharynx clear Chest with crackles one half the way up  bilaterally, but no wheezes Cardiac exam with regular rate and rhythm Lower extremities with no significant edema, no cyanosis Alert and oriented, moves all 4 extremities.       Assessment & Plan:

## 2011-11-02 NOTE — Progress Notes (Signed)
PFT done today. 

## 2011-11-02 NOTE — Assessment & Plan Note (Addendum)
The patient has worsening interstitial lung disease by his chest x-ray, as well as worsening restriction and DLCO on his pulmonary function studies.  He also has seen some clinical worsening over the last one year.  I have discussed with him possible treatment options, including referral to [facility] for participation in investigational trials.  He will think about this.  In the meantime, I have stressed to him the importance of weight loss and also improving his physical condition, if he ever wishes to be considered for lung transplantation if his lung disease worsens.  The patient also had desaturation with exertion today, and would benefit from exertional oxygen.  Will also check overnight oximetry to see if he needs oxygen at night.  The patient's lung biopsy was consistent with UIP, and his autoimmune panel was negative except for a slightly elevated rheumatoid factor.  I think we ought to repeat this again since it has been 3 years.  If his rheumatoid factor is significantly more positive, would consider referral to rheumatology.

## 2011-11-02 NOTE — Assessment & Plan Note (Signed)
The patient's cough sounds more upper airway in origin than anything else, and probably related to postnasal drip.  However, I cannot exclude a component related to his interstitial lung disease.  He is also on inhalers but does not have air flow obstruction, and we'll therefore discontinued these since they can worsen cough.

## 2011-11-02 NOTE — Patient Instructions (Addendum)
Will check autoimmune bloodwork again today, and will call you with results. Will check cxr at end of visit, and will call you with results. Consider investigational studies at [facility] if you are interested. Try chlorpheniramine 8mg  at bedtime in the place of zyrtec to see if helps with postnasal drip Stop all inhalers.  You do not have airways disease.  followup with me in 61mos, but call if having worsening symptoms.  Will need to start you on oxygen with exertional activities, and will also check your level at night while sleeping.

## 2011-11-03 ENCOUNTER — Telehealth: Payer: Self-pay | Admitting: Pulmonary Disease

## 2011-11-03 LAB — ANA: Anti Nuclear Antibody(ANA): NEGATIVE

## 2011-11-03 NOTE — Telephone Encounter (Signed)
I spoke with pt and he states his oxygen was brought out to him yesterday but was not told anything about doing ONO. Also he states the tech that set him up with the oxygen advised him he needed to be on oxygen 24/7. I spoke with Janett Billow from [place] and was told his Joselyn Arrow is scheduled to be done one Friday and he will also be getting the smaller tanks. I spoke with pt again and made him aware of this. He voiced his understanding and needed nothing further

## 2011-11-10 ENCOUNTER — Telehealth: Payer: Self-pay | Admitting: Pulmonary Disease

## 2011-11-10 NOTE — Telephone Encounter (Signed)
Pt is aware of ONO results and understands that he does not need to wear the oxygen with sleep as long as he is wearing the cpap. He will still need to wear the oxygen with exertion during the day.

## 2011-11-10 NOTE — Telephone Encounter (Signed)
Pt called to obtain results from his "O2 testing" and his labwork.  Pt can be reached today at [phone].  Satira Anis

## 2011-11-10 NOTE — Telephone Encounter (Signed)
ONO on cpap but off oxygen shows low sat of 87%.  He only spent 49min less than 89%.    Cecille Rubin, please let pt know he does not need oxygen at night as long as he wears his cpap.

## 2011-11-15 ENCOUNTER — Encounter: Payer: Self-pay | Admitting: Pulmonary Disease

## 2011-11-15 ENCOUNTER — Other Ambulatory Visit: Payer: Self-pay | Admitting: Pulmonary Disease

## 2011-11-15 DIAGNOSIS — R0600 Dyspnea, unspecified: Secondary | ICD-10-CM

## 2011-11-16 ENCOUNTER — Encounter: Payer: Self-pay | Admitting: Pulmonary Disease

## 2012-01-06 ENCOUNTER — Encounter: Payer: Self-pay | Admitting: Pulmonary Disease

## 2012-01-12 ENCOUNTER — Other Ambulatory Visit: Payer: Self-pay | Admitting: Family Medicine

## 2012-01-12 NOTE — Telephone Encounter (Signed)
Deny, needs ov

## 2012-03-16 ENCOUNTER — Encounter: Payer: Self-pay | Admitting: *Deleted

## 2012-05-04 ENCOUNTER — Ambulatory Visit (INDEPENDENT_AMBULATORY_CARE_PROVIDER_SITE_OTHER): Payer: Managed Care, Other (non HMO) | Admitting: Pulmonary Disease

## 2012-05-04 ENCOUNTER — Encounter: Payer: Self-pay | Admitting: Pulmonary Disease

## 2012-05-04 VITALS — BP 140/88 | HR 65 | Temp 97.6°F | Ht 63.0 in | Wt 172.0 lb

## 2012-05-04 DIAGNOSIS — G4733 Obstructive sleep apnea (adult) (pediatric): Secondary | ICD-10-CM

## 2012-05-04 DIAGNOSIS — J84111 Idiopathic interstitial pneumonia, not otherwise specified: Secondary | ICD-10-CM

## 2012-05-04 NOTE — Assessment & Plan Note (Signed)
The patient feels that he is stable since his last visit, but continues to have significant dyspnea on exertion.  I think it is very important that he work aggressively on weight loss, and also participate in a pulmonary rehabilitation program.  I have reminded him this is key to be considered for lung transplantation in the future.  I've also asked him to stay on oxygen with his exertional activities.  I would like to see him back in 6 months where he will be due for his PFTs and chest x-ray, but he is to call if he has worsening symptoms.  I have also reminded him that we can send him to [facility] for consideration of investigational trials.  He would like to hold off on this currently

## 2012-05-04 NOTE — Progress Notes (Signed)
  Subjective:    Patient ID: [name], male    DOB: 1950-11-29, 61 y.o.   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  He has been trying to stay as active as possible, and has not seen a change in his exertional tolerance since last visit.  He is wearing oxygen as needed with exertional activities during the day, and has had very little dry cough.  He has had a lot of postnasal drip which results in a cough with mucus in the morning but clears after an hour or so.  I've asked him to try a more aggressive nasal hygiene regimen.  He is also having issues with his CPAP, and feels that he is smothering when he first puts the device on.  He is not using his ramp button.    Review of Systems  Constitutional: Negative for fever and unexpected weight change.  HENT: Positive for nosebleeds, congestion, rhinorrhea, sneezing, postnasal drip and sinus pressure. Negative for ear pain, sore throat, trouble swallowing and dental problem.   Eyes: Negative for redness and itching.  Respiratory: Positive for cough and shortness of breath. Negative for chest tightness and wheezing.   Cardiovascular: Negative for palpitations and leg swelling.  Gastrointestinal: Negative for nausea and vomiting.  Genitourinary: Negative for dysuria.  Musculoskeletal: Negative for joint swelling.  Skin: Negative for rash.  Neurological: Negative for headaches.  Hematological: Does not bruise/bleed easily.  Psychiatric/Behavioral: Negative for dysphoric mood. The patient is not nervous/anxious.        Objective:   Physical Exam Overweight male in no acute distress Nose without purulence or discharge noted Neck without lymphadenopathy or thyromegaly Chest with basilar crackles one third the way up bilaterally, no wheezing Cardiac exam is regular rate and rhythm Lower extremities without edema, no cyanosis Alert  and oriented, moves all 4 extremities.       Assessment & Plan:

## 2012-05-04 NOTE — Patient Instructions (Addendum)
Will refer to pulmonary rehab at [facility] Will send an order to [employer] to show you how to use the ramp button on your machine Work on weight loss and conditioning. followup with me in 65mos.

## 2012-08-01 ENCOUNTER — Encounter: Payer: Self-pay | Admitting: *Deleted

## 2012-09-04 ENCOUNTER — Telehealth: Payer: Self-pay | Admitting: Pulmonary Disease

## 2012-09-04 NOTE — Telephone Encounter (Signed)
Patient aware that paperwork is complete and ready to be picked up. Patient to come pick up in the morning 09/05/12.

## 2012-09-04 NOTE — Telephone Encounter (Signed)
Form has been placed in [name] folder to address.

## 2012-09-04 NOTE — Telephone Encounter (Signed)
done

## 2012-10-04 ENCOUNTER — Ambulatory Visit (INDEPENDENT_AMBULATORY_CARE_PROVIDER_SITE_OTHER): Payer: Managed Care, Other (non HMO) | Admitting: Pulmonary Disease

## 2012-10-04 ENCOUNTER — Encounter: Payer: Self-pay | Admitting: Pulmonary Disease

## 2012-10-04 ENCOUNTER — Ambulatory Visit (INDEPENDENT_AMBULATORY_CARE_PROVIDER_SITE_OTHER)
Admission: RE | Admit: 2012-10-04 | Discharge: 2012-10-04 | Disposition: A | Discharge status: Home / Self Care | Payer: Managed Care, Other (non HMO) | Source: Ambulatory Visit | Attending: Pulmonary Disease | Admitting: Pulmonary Disease

## 2012-10-04 VITALS — BP 142/80 | HR 93 | Temp 97.3°F | Ht 63.0 in | Wt 177.0 lb

## 2012-10-04 DIAGNOSIS — J84111 Idiopathic interstitial pneumonia, not otherwise specified: Secondary | ICD-10-CM

## 2012-10-04 DIAGNOSIS — G4733 Obstructive sleep apnea (adult) (pediatric): Secondary | ICD-10-CM

## 2012-10-04 NOTE — Assessment & Plan Note (Signed)
The patient has a history of sleep apnea, and his currently having issues with his mask fit and also dryness.  He is not using his heated humidifier appropriately, and I have instructed him on how to do this. He is also having significant mask leak, and I will refer him to the sleep center for better fitting.  I have also encouraged him to work aggressively on weight loss.

## 2012-10-04 NOTE — Progress Notes (Signed)
  Subjective:    Patient ID: [name], male    DOB: 1950-07-10, 62 y.o.   MRN: [id number]  HPI The patient comes in today for followup of his idiopathic pulmonary fibrosis, as well as his obstructive sleep apnea.  He is wearing CPAP compliantly, however most recently began to have issues with mask leaks and also nasal congestion.  He has updated his mask cushions, but this continues to be an issue.  He is not aware of how to adjust the humidity on his humidifier.  He continues to have a lot of nasal symptoms with postnasal drip, but states that chlorpheniramine really helps with this.  He also feels that his breathing is not doing as well from an exertional standpoint, however he is not wearing his oxygen compliantly as I instructed with activities.  He was referred to pulmonary rehabilitation at the last visit, however has not heard from them.  He is due today for his chest x-ray and followup PFTs.   Review of Systems  Constitutional: Negative for fever and unexpected weight change.  HENT: Positive for congestion, rhinorrhea, sneezing, postnasal drip and sinus pressure. Negative for ear pain, nosebleeds, sore throat, trouble swallowing and dental problem.   Eyes: Positive for pain ( burning), redness and itching.       Eyes watering  Respiratory: Positive for cough, chest tightness and shortness of breath. Negative for wheezing.   Cardiovascular: Positive for chest pain and palpitations. Negative for leg swelling.  Gastrointestinal: Negative for nausea and vomiting.  Genitourinary: Negative for dysuria.  Musculoskeletal: Negative for joint swelling.  Skin: Negative for rash.  Neurological: Negative for headaches.  Hematological: Does not bruise/bleed easily.  Psychiatric/Behavioral: Negative for dysphoric mood. The patient is nervous/anxious.        Objective:   Physical Exam Well-developed male in no acute distress Nose without purulence or discharge noted No skin breakdown or  pressure necrosis from the CPAP mask Neck without lymphadenopathy or thyromegaly Chest with crackles one third of the way up bilaterally, no wheezing Cardiac exam with regular rate and rhythm Lower extremities without edema, no cyanosis Alert, does not appear to be sleepy, moves all 4 extremities.       Assessment & Plan:

## 2012-10-04 NOTE — Patient Instructions (Addendum)
Will resend referral to pulmonary rehab.  If you do not hear from them next week, let me know. Turn up heat on your cpap humidifier to get more moisture. Will send you to sleep center during the day for a mask fitting. Can try mucinex dm extra strength one in am and pm for a few weeks to see if helps mucus. Will check cxr today, and call you with results. Will schedule for breathing tests in next few weeks, and call you with results Wear your oxygen more consistently with exertional activities. Will let you know if you qualify for the upcoming drug study we discussed if you are interested followup with me in 33mos.

## 2012-10-04 NOTE — Assessment & Plan Note (Signed)
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  He has seen some decline in his exertional tolerance, but is not wearing his oxygen compliantly as I have discussed with him on numerous occasions.  He never interpreted into pulmonary rehabilitation, and we will send a referral again.  He is due for his followup chest x-ray and pulmonary function studies.  I also discussed with him the possibility of entering into the pirfenidone trial.  He continues to have a cough this sounds mostly upper airway and related to postnasal drip, but I cannot exclude the possibility of his interstitial disease being responsible.

## 2012-10-10 ENCOUNTER — Ambulatory Visit (HOSPITAL_BASED_OUTPATIENT_CLINIC_OR_DEPARTMENT_OTHER): Payer: Managed Care, Other (non HMO) | Attending: Pulmonary Disease | Admitting: Radiology

## 2012-10-10 DIAGNOSIS — G4733 Obstructive sleep apnea (adult) (pediatric): Secondary | ICD-10-CM

## 2012-10-10 DIAGNOSIS — Z9989 Dependence on other enabling machines and devices: Secondary | ICD-10-CM

## 2012-10-11 ENCOUNTER — Telehealth: Payer: Self-pay | Admitting: Pulmonary Disease

## 2012-10-11 NOTE — Telephone Encounter (Signed)
This letter will be given to [facility] in the morning d/t him being out of office this afternoon. Patient aware.

## 2012-10-12 ENCOUNTER — Telehealth: Payer: Self-pay | Admitting: Pulmonary Disease

## 2012-10-12 ENCOUNTER — Telehealth: Payer: Self-pay | Admitting: *Deleted

## 2012-10-12 NOTE — Telephone Encounter (Signed)
Form is on your desk Dr Gwenette Greet. Pt needs back ASAP. Thanks.

## 2012-10-12 NOTE — Telephone Encounter (Signed)
See message dated 10/11/12 This message will be closed and will continue on other message

## 2012-10-12 NOTE — Telephone Encounter (Signed)
Patient needing [employer] paperwork filled out for upcoming trip. LMOM x 1 to [facility]  Need to know: 1- How many batteries that patient has for his machine---must have per form, atleast 2 batteries 2- Airline confirmation number (six digits) 3- Flight number(s) with date of travel 4- Type of machine that patient has-- make, model, liter dose, company

## 2012-10-12 NOTE — Telephone Encounter (Signed)
Patient called back and verified the information needed for the form. States that he has another form at home that he has his portion filled out already on and he plans to just copy it all over to the form that [place] signs. This has been given to [facility] to sign and patient to come pick up on Monday.

## 2012-10-16 ENCOUNTER — Other Ambulatory Visit: Payer: Self-pay | Admitting: Gastroenterology

## 2012-10-17 ENCOUNTER — Telehealth (HOSPITAL_COMMUNITY): Payer: Self-pay | Admitting: *Deleted

## 2012-10-17 NOTE — Telephone Encounter (Signed)
Telephone call placed to patient, message left on answering machine requesting patient to return our call. Leverne Humbles RN

## 2012-10-31 ENCOUNTER — Encounter: Payer: Self-pay | Admitting: Pulmonary Disease

## 2012-11-07 ENCOUNTER — Ambulatory Visit (INDEPENDENT_AMBULATORY_CARE_PROVIDER_SITE_OTHER): Payer: Managed Care, Other (non HMO) | Admitting: Pulmonary Disease

## 2012-11-07 DIAGNOSIS — J84111 Idiopathic interstitial pneumonia, not otherwise specified: Secondary | ICD-10-CM

## 2012-11-07 LAB — PULMONARY FUNCTION TEST

## 2012-11-07 NOTE — Progress Notes (Signed)
PFT done today. 

## 2012-11-11 ENCOUNTER — Encounter (HOSPITAL_COMMUNITY): Payer: Self-pay | Admitting: Emergency Medicine

## 2012-11-11 ENCOUNTER — Inpatient Hospital Stay (HOSPITAL_COMMUNITY)
Admission: EM | Admit: 2012-11-11 | Discharge: 2012-11-13 | DRG: 195 | Disposition: A | Discharge status: Home / Self Care | Payer: Managed Care, Other (non HMO) | Attending: Pulmonary Disease | Admitting: Pulmonary Disease

## 2012-11-11 ENCOUNTER — Emergency Department (HOSPITAL_COMMUNITY): Payer: Managed Care, Other (non HMO)

## 2012-11-11 ENCOUNTER — Inpatient Hospital Stay (HOSPITAL_COMMUNITY): Payer: Managed Care, Other (non HMO)

## 2012-11-11 DIAGNOSIS — J849 Interstitial pulmonary disease, unspecified: Secondary | ICD-10-CM

## 2012-11-11 DIAGNOSIS — K219 Gastro-esophageal reflux disease without esophagitis: Secondary | ICD-10-CM | POA: Diagnosis present

## 2012-11-11 DIAGNOSIS — IMO0002 Reserved for concepts with insufficient information to code with codable children: Secondary | ICD-10-CM | POA: Diagnosis present

## 2012-11-11 DIAGNOSIS — M1990 Unspecified osteoarthritis, unspecified site: Secondary | ICD-10-CM | POA: Diagnosis present

## 2012-11-11 DIAGNOSIS — Z87891 Personal history of nicotine dependence: Secondary | ICD-10-CM

## 2012-11-11 DIAGNOSIS — I10 Essential (primary) hypertension: Secondary | ICD-10-CM | POA: Diagnosis present

## 2012-11-11 DIAGNOSIS — E119 Type 2 diabetes mellitus without complications: Secondary | ICD-10-CM | POA: Diagnosis present

## 2012-11-11 DIAGNOSIS — R05 Cough: Secondary | ICD-10-CM

## 2012-11-11 DIAGNOSIS — G4733 Obstructive sleep apnea (adult) (pediatric): Secondary | ICD-10-CM | POA: Diagnosis present

## 2012-11-11 DIAGNOSIS — K449 Diaphragmatic hernia without obstruction or gangrene: Secondary | ICD-10-CM

## 2012-11-11 DIAGNOSIS — J84111 Idiopathic interstitial pneumonia, not otherwise specified: Secondary | ICD-10-CM

## 2012-11-11 DIAGNOSIS — J8410 Pulmonary fibrosis, unspecified: Secondary | ICD-10-CM

## 2012-11-11 DIAGNOSIS — Z79899 Other long term (current) drug therapy: Secondary | ICD-10-CM

## 2012-11-11 DIAGNOSIS — E785 Hyperlipidemia, unspecified: Secondary | ICD-10-CM | POA: Diagnosis present

## 2012-11-11 DIAGNOSIS — J189 Pneumonia, unspecified organism: Secondary | ICD-10-CM | POA: Diagnosis present

## 2012-11-11 DIAGNOSIS — R0600 Dyspnea, unspecified: Secondary | ICD-10-CM

## 2012-11-11 DIAGNOSIS — I359 Nonrheumatic aortic valve disorder, unspecified: Secondary | ICD-10-CM | POA: Diagnosis present

## 2012-11-11 HISTORY — DX: Pneumonia, unspecified organism: J18.9

## 2012-11-11 HISTORY — DX: Type 2 diabetes mellitus without complications: E11.9

## 2012-11-11 LAB — CBC WITH DIFFERENTIAL/PLATELET
Basophils Absolute: 0 10*3/uL (ref 0.0–0.1)
Basophils Relative: 0 % (ref 0–1)
Eosinophils Absolute: 0.4 10*3/uL (ref 0.0–0.7)
Eosinophils Relative: 4 % (ref 0–5)
HCT: 46.8 % (ref 39.0–52.0)
Hemoglobin: 16.2 g/dL (ref 13.0–17.0)
Lymphocytes Relative: 28 % (ref 12–46)
Lymphs Abs: 2.5 10*3/uL (ref 0.7–4.0)
MCH: 28.9 pg (ref 26.0–34.0)
MCHC: 34.6 g/dL (ref 30.0–36.0)
MCV: 83.4 fL (ref 78.0–100.0)
Monocytes Absolute: 0.8 10*3/uL (ref 0.1–1.0)
Monocytes Relative: 9 % (ref 3–12)
Neutro Abs: 5.3 10*3/uL (ref 1.7–7.7)
Neutrophils Relative %: 59 % (ref 43–77)
Platelets: 314 10*3/uL (ref 150–400)
RBC: 5.61 MIL/uL (ref 4.22–5.81)
RDW: 14.6 % (ref 11.5–15.5)
WBC: 8.9 10*3/uL (ref 4.0–10.5)

## 2012-11-11 LAB — TROPONIN I: Troponin I: <0.3 ng/mL (ref <0.30)

## 2012-11-11 LAB — BASIC METABOLIC PANEL
BUN: 14 mg/dL (ref 6–23)
CO2: 24 mEq/L (ref 19–32)
Calcium: 9.5 mg/dL (ref 8.4–10.5)
Chloride: 103 mEq/L (ref 96–112)
Creatinine, Ser: 1.3 mg/dL (ref 0.50–1.35)
GFR calc Af Amer: 66 mL/min — ABNORMAL LOW (ref >90)
GFR calc non Af Amer: 57 mL/min — ABNORMAL LOW (ref >90)
Glucose, Bld: 70 mg/dL (ref 70–99)
Potassium: 3.5 mEq/L (ref 3.5–5.1)
Sodium: 137 mEq/L (ref 135–145)

## 2012-11-11 MED ORDER — DM-GUAIFENESIN ER 30-600 MG PO TB12
1.0000 | ORAL_TABLET | Freq: Every day | ORAL | Status: DC | PRN
  Filled 2012-11-11: qty 1

## 2012-11-11 MED ORDER — ASPIRIN 81 MG PO CHEW
81.0000 mg | CHEWABLE_TABLET | Freq: Every day | ORAL | Status: DC
  Administered 2012-11-12 – 2012-11-13 (×2): 81 mg via ORAL
  Filled 2012-11-11 (×2): qty 1

## 2012-11-11 MED ORDER — SODIUM CHLORIDE 0.9 % IJ SOLN
3.0000 mL | Freq: Two times a day (BID) | INTRAMUSCULAR | Status: DC
  Administered 2012-11-12 – 2012-11-13 (×4): 3 mL via INTRAVENOUS

## 2012-11-11 MED ORDER — ADULT MULTIVITAMIN W/MINERALS CH
1.0000 | ORAL_TABLET | Freq: Every day | ORAL | Status: DC
  Administered 2012-11-12 – 2012-11-13 (×2): 1 via ORAL
  Filled 2012-11-11 (×2): qty 1

## 2012-11-11 MED ORDER — PREDNISONE 20 MG PO TABS
60.0000 mg | ORAL_TABLET | Freq: Once | ORAL | Status: AC
  Administered 2012-11-11: 60 mg via ORAL
  Filled 2012-11-11: qty 3

## 2012-11-11 MED ORDER — SODIUM CHLORIDE 0.9 % IJ SOLN: 3.0000 mL | INTRAMUSCULAR | Status: DC | PRN

## 2012-11-11 MED ORDER — ATORVASTATIN CALCIUM 20 MG PO TABS
20.0000 mg | ORAL_TABLET | Freq: Every day | ORAL | Status: DC
  Administered 2012-11-12 – 2012-11-13 (×2): 20 mg via ORAL
  Filled 2012-11-11 (×2): qty 1

## 2012-11-11 MED ORDER — ASPIRIN 81 MG PO CHEW
324.0000 mg | CHEWABLE_TABLET | Freq: Once | ORAL | Status: AC
  Administered 2012-11-11: 324 mg via ORAL
  Filled 2012-11-11: qty 4

## 2012-11-11 MED ORDER — VITAMIN D 1000 UNITS PO TABS
1000.0000 [IU] | ORAL_TABLET | Freq: Every day | ORAL | Status: DC
  Administered 2012-11-12 – 2012-11-13 (×2): 1000 [IU] via ORAL
  Filled 2012-11-11 (×2): qty 1

## 2012-11-11 MED ORDER — PREDNISONE 20 MG PO TABS: 40.0000 mg | ORAL_TABLET | Freq: Every day | ORAL | Status: DC

## 2012-11-11 MED ORDER — AMLODIPINE BESYLATE-VALSARTAN 5-160 MG PO TABS: 1.0000 | ORAL_TABLET | Freq: Every day | ORAL | Status: DC

## 2012-11-11 MED ORDER — ALBUTEROL SULFATE (5 MG/ML) 0.5% IN NEBU
5.0000 mg | INHALATION_SOLUTION | Freq: Once | RESPIRATORY_TRACT | Status: AC
  Administered 2012-11-11: 5 mg via RESPIRATORY_TRACT
  Filled 2012-11-11: qty 1

## 2012-11-11 MED ORDER — PREDNISONE 50 MG PO TABS
60.0000 mg | ORAL_TABLET | Freq: Every day | ORAL | Status: DC
  Administered 2012-11-12 – 2012-11-13 (×2): 60 mg via ORAL
  Filled 2012-11-11 (×3): qty 1

## 2012-11-11 MED ORDER — DEXTROSE 5 % IV SOLN
1.0000 g | INTRAVENOUS | Status: DC
  Administered 2012-11-12 (×2): 1 g via INTRAVENOUS
  Filled 2012-11-11 (×2): qty 10

## 2012-11-11 MED ORDER — AMLODIPINE BESYLATE 5 MG PO TABS
5.0000 mg | ORAL_TABLET | Freq: Every day | ORAL | Status: DC
  Administered 2012-11-12 – 2012-11-13 (×2): 5 mg via ORAL
  Filled 2012-11-11 (×2): qty 1

## 2012-11-11 MED ORDER — AZITHROMYCIN 250 MG PO TABS: 250.0000 mg | ORAL_TABLET | Freq: Every day | ORAL | Status: DC

## 2012-11-11 MED ORDER — MULTIVITAMINS PO CAPS: 1.0000 | ORAL_CAPSULE | Freq: Every day | ORAL | Status: DC

## 2012-11-11 MED ORDER — GLIPIZIDE ER 5 MG PO TB24
5.0000 mg | ORAL_TABLET | Freq: Every day | ORAL | Status: DC
  Administered 2012-11-12 – 2012-11-13 (×2): 5 mg via ORAL
  Filled 2012-11-11 (×2): qty 1

## 2012-11-11 MED ORDER — PREDNISONE 20 MG PO TABS: 60.0000 mg | ORAL_TABLET | Freq: Once | ORAL | Status: DC

## 2012-11-11 MED ORDER — IRBESARTAN 150 MG PO TABS
150.0000 mg | ORAL_TABLET | Freq: Every day | ORAL | Status: DC
  Administered 2012-11-12 – 2012-11-13 (×2): 150 mg via ORAL
  Filled 2012-11-11 (×2): qty 1

## 2012-11-11 MED ORDER — ENOXAPARIN SODIUM 80 MG/0.8ML ~~LOC~~ SOLN
80.0000 mg | Freq: Once | SUBCUTANEOUS | Status: AC
  Administered 2012-11-11: 80 mg via SUBCUTANEOUS
  Filled 2012-11-11: qty 1

## 2012-11-11 MED ORDER — HEPARIN SODIUM (PORCINE) 5000 UNIT/ML IJ SOLN
5000.0000 [IU] | Freq: Three times a day (TID) | INTRAMUSCULAR | Status: DC
  Administered 2012-11-12 – 2012-11-13 (×4): 5000 [IU] via SUBCUTANEOUS
  Filled 2012-11-11 (×7): qty 1

## 2012-11-11 MED ORDER — SODIUM CHLORIDE 0.9 % IV SOLN: 250.0000 mL | INTRAVENOUS | Status: DC | PRN

## 2012-11-11 MED ORDER — IOHEXOL 350 MG/ML SOLN
100.0000 mL | Freq: Once | INTRAVENOUS | Status: AC | PRN
  Administered 2012-11-11: 100 mL via INTRAVENOUS

## 2012-11-11 MED ORDER — IPRATROPIUM BROMIDE 0.02 % IN SOLN
0.5000 mg | Freq: Once | RESPIRATORY_TRACT | Status: AC
  Administered 2012-11-11: 0.5 mg via RESPIRATORY_TRACT
  Filled 2012-11-11: qty 2.5

## 2012-11-11 MED ORDER — PANTOPRAZOLE SODIUM 40 MG PO TBEC
40.0000 mg | DELAYED_RELEASE_TABLET | Freq: Every day | ORAL | Status: DC
  Administered 2012-11-12 – 2012-11-13 (×2): 40 mg via ORAL
  Filled 2012-11-11: qty 1

## 2012-11-11 MED ORDER — DEXTROSE 5 % IV SOLN
500.0000 mg | INTRAVENOUS | Status: DC
  Administered 2012-11-12: 500 mg via INTRAVENOUS
  Filled 2012-11-11: qty 500

## 2012-11-11 NOTE — ED Notes (Signed)
Ppt. Urinated via urinal

## 2012-11-11 NOTE — ED Notes (Signed)
Pt. Returned from CT.

## 2012-11-11 NOTE — ED Provider Notes (Signed)
Medical screening examination/treatment/procedure(s) were conducted as a shared visit with non-physician practitioner(s) and myself.  I personally evaluated the patient during the encounter.  61yM with dyspnea. Hx of idiopathic pulmonary fibrosis, but this is an acute change. Pt typically only uses oxygen PRN such when exerting self when doing yard work. Has been using for past week all the time. Pt ambulated in ED on 3L [place] and desaturated to 81%. Etiology not completely clear. Recent plane rides but it sounds like symptoms started on first part of travel home before had done much sitting. PE consideration though. Unfortunately initial CT not angio. Will repeat as PE protocol. Doubt infectious. Consider anginal euquivalent but doubt. Not anemic.  Pt sounds clear on my exam. Not sure how much may benefit with steroids, but dose ordered. Regardless of results of CT, will admit given degree of symptoms/hypoxia. Will discuss with pulmonology. Medicine admit if not.   Virgel Manifold, MD 11/11/12 2013

## 2012-11-11 NOTE — ED Provider Notes (Signed)
History     CSN: [id number]  Arrival date & time 11/11/12  4   First MD Initiated Contact with Patient 11/11/12 1310      Chief Complaint  Patient presents with  . Shortness of Breath    (Consider location/radiation/quality/duration/timing/severity/associated sxs/prior treatment) HPI Pt is 62yo male with hx of interstitial pneumonia and home oxygen use as needed presenting with worsening SOB since Monday.  Reports being an 14 day land and sea cruise in [place], returned to [place] on Monday.  Pt was seen by Dr. Gwenette Greet for previously scheduled pulmonary testing, still awaiting results.  Pt reports yellow phlegm with productive cough that also started Monday.  Denies fever, n/v/d.    Past Medical History  Diagnosis Date  . Benign neoplasm of colon   . Aortic valve disorders   . Osteoarthrosis, unspecified whether generalized or localized, unspecified site   . Degeneration of intervertebral disc, site unspecified   . Other and unspecified hyperlipidemia   . Unspecified essential hypertension   . Obstructive sleep apnea (adult) (pediatric)   . Esophageal reflux   . Diaphragmatic hernia without mention of obstruction or gangrene     History reviewed. No pertinent past surgical history.  Family History  Problem Relation Age of Onset  . Pancreatic cancer Brother   . Heart disease Father     History  Substance Use Topics  . Smoking status: Former Smoker -- 0.30 packs/day for 20 years    Types: Cigarettes    Quit date: 06/06/2006  . Smokeless tobacco: Never Used  . Alcohol Use: No      Review of Systems  Constitutional: Positive for diaphoresis and fatigue. Negative for fever and chills.  Respiratory: Positive for cough, chest tightness and shortness of breath. Negative for choking.   Cardiovascular: Positive for chest pain ( pressure).  Gastrointestinal: Negative for nausea, vomiting and diarrhea.  All other systems reviewed and are negative.    Allergies   Levofloxacin  Home Medications   Current Outpatient Rx  Name  Route  Sig  Dispense  Refill  . amLODipine-valsartan (EXFORGE) 5-160 MG per tablet   Oral   Take 1 tablet by mouth daily.          Marland Kitchen aspirin 81 MG tablet   Oral   Take 81 mg by mouth daily.          Marland Kitchen atorvastatin (LIPITOR) 20 MG tablet   Oral   Take 20 mg by mouth daily. 1 by mouth daily         . chlorpheniramine (CHLOR-TRIMETON) 4 MG tablet   Oral   Take 4 mg by mouth 2 (two) times daily as needed.         . Cholecalciferol (VITAMIN D) 1000 UNITS capsule   Oral   Take 1,000 Units by mouth daily.          Marland Kitchen dextromethorphan-guaiFENesin (MUCINEX DM) 30-600 MG per 12 hr tablet   Oral   Take 1 tablet by mouth daily as needed (for lung clearance).         Marland Kitchen GLIPIZIDE XL 5 MG 24 hr tablet   Oral   Take 5 mg by mouth daily. 1 by mouth daily         . Multiple Vitamin (MULTIVITAMIN) capsule   Oral   Take 1 capsule by mouth daily.          Marland Kitchen omeprazole (PRILOSEC) 40 MG capsule   Oral   Take 40 mg by mouth daily.  BP 106/85  Pulse 88  Temp(Src) 98.1 F (36.7 C) (Oral)  Resp 22  SpO2 92%  Physical Exam  Nursing note and vitals reviewed. Constitutional: He appears well-developed and well-nourished. No distress.  Pt lying in exam bed, taking rapid and shallow breathes.  Able to speak in broken sentences, appears very anxious and tearful with family and friends in the room.   HENT:  Head: Normocephalic and atraumatic.  Eyes: Conjunctivae are normal. No scleral icterus.  Neck: Normal range of motion.  Cardiovascular: Normal rate, regular rhythm and normal heart sounds.   Pulmonary/Chest: He is in respiratory distress ( mild). He has no wheezes. He has no rales. He exhibits no tenderness.  Pt takes rapid shallow breathes, able speak in broken sentences, insists on speaking when advised to take deep slow breaths   Abdominal: Soft. Bowel sounds are normal. He exhibits no  distension and no mass. There is no tenderness. There is no rebound and no guarding.  Neurological: He is alert.  Skin: Skin is warm. He is not diaphoretic.  Psychiatric: His mood appears anxious.    ED Course  Procedures (including critical care time)  Labs Reviewed  BASIC METABOLIC PANEL - Abnormal; Notable for the following:    GFR calc non Af Amer 57 (*)    GFR calc Af Amer 66 (*)    All other components within normal limits  CBC WITH DIFFERENTIAL  TROPONIN I   Dg Chest 2 View  11/11/2012   *RADIOLOGY REPORT*  Clinical Data: Shortness of breath.  CHEST - 2 VIEW  Comparison: Multiple priors, most recently 10/04/2012.  Findings: As with prior examinations, lung volumes are slightly low and there is a very coarse diffuse reticulonodular pattern throughout the lungs bilaterally, favored to represent a chronic interstitial lung disease.  The amount of apparent nodularity in this pattern does appear increased, however, particularly throughout the lung bases bilaterally.  The possibility of the malignant nodules from metastatic disease is not excluded.  No definite acute consolidative airspace disease.  No definite evidence of pulmonary edema.  Heart size appears upper limits of normal. The patient is rotated to the right on today's exam, resulting in distortion of the mediastinal contours and reduced diagnostic sensitivity and specificity for mediastinal pathology.  IMPRESSION: 1.  The appearance of the chest is strongly suggestive of an underlying interstitial lung disease, which appears worse than the prior examination.  In particular, there are increasing nodular opacities throughout the lower lungs bilaterally.  While this may simply be a manifestation of the underlying interstitial lung disease, superimposed nodularity from metastases or infection is difficult to entirely exclude.  For better characterization of these findings, a high-resolution noncontrast chest CT is suggested.   Original  Report Authenticated By: Vinnie Langton, M.D.   Ct Chest Wo Contrast  11/11/2012   *RADIOLOGY REPORT*  Clinical Data: Sudden onset short of breath.  No chest pain.  CT CHEST WITHOUT CONTRAST  Technique:  Multidetector CT imaging of the chest was performed following the standard protocol without IV contrast.  Comparison: Chest radiograph today.  Findings: Chronic interstitial lung disease is present.  The fibrosis is relatively uniform although there is some sparing of the left base.  Borderline prevascular lymph node measures 1 cm short axis.  Subcarinal lymph node measures 13 mm short axis. Coronary artery atherosclerosis is present. If office based assessment of coronary risk factors has not been performed, it is now recommended.  No pericardial or pleural effusion.  No definite superimposed  pneumonia.  There appear to be lungs staples in the right lower lobe, suggesting prior wedge resection or biopsy. Emphysematous changes are present at the lung apices.  Incidental imaging of the upper abdomen is within normal limits.  Thoracic spondylosis is present.  Sternomanubrial degenerative disease.  No aggressive osseous lesions.  Noncontrast appearance of the aorta is within normal limits.  Small hiatal hernia and patulous gastroesophageal junction.  IMPRESSION: Findings compatible with chronic interstitial lung disease.  The appearance is nonspecific.  Differential considerations include IPF/UIP, combined pulmonary fibrosis and emphysema (CPFE), fibrotic nonspecific interstitial pneumonia and pulmonary sarcoidosis. Borderline lymph nodes are probably reactive to chronic inflammatory changes.  No convincing evidence of bronchogenic carcinoma or metastatic disease to the chest.   Original Report Authenticated By: Dereck Ligas, M.D.    Date: 11/11/2012  Rate: 83  Rhythm: normal sinus rhythm  QRS Axis: normal  Intervals: normal  ST/T Wave abnormalities: normal  Conduction Disutrbances:none  Narrative  Interpretation:   Old EKG Reviewed: unchanged    1. Dyspnea   2. Interstitial lung disease       MDM  Concern for exacerbation of interstitial pneumonia, CAD, PE.  Will obtain CBC, BMP, CXR   CBC: WNL BMP: consistent with pt's previous labs, unremarkable.  Troponin neg.  CXR: findings consistent with interestitial lung disease, however appears worse than prior exam (no date given by radiologist of comparison).  Recommended high-resolution non-contrast CT CT chest: findings compatible w/ chronic interstitial lung disease.   Tx in ED: albuterol and atrovent neb, prednisone.  Pt improved siginficantly in appearance since initial arrival to ED. Pt no longer tachypneic, stating he would like some dinner now and ready to go home.  Pt is still on 2-3L nasal cannula with O2 hovering around 88-90%. Will obtain ambulatory O2 but likely need to admit pt for observation.  PE could not be completely r/o by non-contrast CT.  May need further evaluation upon admittance.  Dr. Wilson Singer consulted medicine team, Dr. Elsworth Soho, internal medicine, who agreed to come see pt and admit to tele bed.         Noland Fordyce, PA-C 11/11/12 1945

## 2012-11-11 NOTE — ED Notes (Signed)
92% on RA , and put on 3-4 L O2  Up to 97%.

## 2012-11-11 NOTE — ED Notes (Signed)
At 1750 pt ate 100% of his sandwich. Ordered a regular dinner tray

## 2012-11-11 NOTE — ED Notes (Signed)
EKG NSR per EMS. Pt. Stated, I feel exhausted.

## 2012-11-11 NOTE — ED Notes (Signed)
Pt. Stated, that feels better. I feel better breathing.

## 2012-11-11 NOTE — ED Notes (Signed)
Dr. Wilson Singer in with pt.

## 2012-11-11 NOTE — ED Notes (Signed)
Delay in EKG because pt needed to use restroom

## 2012-11-11 NOTE — H&P (Signed)
PULMONARY  / CRITICAL CARE MEDICINE  Name: [name] MRN: [id number] DOB: Jan 04, 1951    ADMISSION DATE:  11/11/2012  PRIMARY SERVICE: PCCM  CHIEF COMPLAINT:  SOB, cough, chills  BRIEF PATIENT DESCRIPTION:  62 years old male with known IPF. Presents with increased SOB, cough and chills.  SIGNIFICANT EVENTS / STUDIES:  -CT angiogram negative for PE.  LINES / TUBES: - Peripheral IV's  CULTURES: Sputum cultures ordered  ANTIBIOTICS: - Rocephin - Zithromax  HISTORY OF PRESENT ILLNESS:   62 years old with PMH relevant for HTN, GERD, OSA, DM and IPF on PRN oxygen at home. Presents with about a week of progressive SOB, increased new cough with sputum production and occasional chills. No fever documented. Of note. The patient traveled to [place] for a cruise and spent a significant amount of time on airplanes. His symptoms started on his way back from [place]. At the time of my exam the patient is awake, alert, oriented x 3, hemodynamically stable and feeling much better. He is saturating 94% on 2 L of O2 via [place].  PAST MEDICAL HISTORY :  Past Medical History  Diagnosis Date  . Benign neoplasm of colon   . Aortic valve disorders   . Osteoarthrosis, unspecified whether generalized or localized, unspecified site   . Degeneration of intervertebral disc, site unspecified   . Other and unspecified hyperlipidemia   . Unspecified essential hypertension   . Obstructive sleep apnea (adult) (pediatric)   . Esophageal reflux   . 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 Pneumonia     interstitial pneumonia  . Diabetes mellitus without complication    Past Surgical History  Procedure Laterality Date  . Lung biopsy  2010   Prior to Admission medications   Medication Sig Start Date End Date Taking? Authorizing Provider  amLODipine-valsartan (EXFORGE) 5-160 MG per tablet Take 1 tablet by mouth daily.    Yes Historical Provider, MD  aspirin 81 MG tablet  Take 81 mg by mouth daily.    Yes Historical Provider, MD  atorvastatin (LIPITOR) 20 MG tablet Take 20 mg by mouth daily. 1 by mouth daily 10/04/11  Yes Historical Provider, MD  chlorpheniramine (CHLOR-TRIMETON) 4 MG tablet Take 4 mg by mouth 2 (two) times daily as needed.   Yes Historical Provider, MD  Cholecalciferol (VITAMIN D) 1000 UNITS capsule Take 1,000 Units by mouth daily.    Yes Historical Provider, MD  dextromethorphan-guaiFENesin (MUCINEX DM) 30-600 MG per 12 hr tablet Take 1 tablet by mouth daily as needed (for lung clearance).   Yes Historical Provider, MD  GLIPIZIDE XL 5 MG 24 hr tablet Take 5 mg by mouth daily. 1 by mouth daily 09/19/11  Yes Historical Provider, MD  Multiple Vitamin (MULTIVITAMIN) capsule Take 1 capsule by mouth daily.    Yes Historical Provider, MD  omeprazole (PRILOSEC) 40 MG capsule Take 40 mg by mouth daily.    Yes Historical Provider, MD   Allergies  Allergen Reactions  . Levofloxacin Other (See Comments)    FAMILY HISTORY:  Family History  Problem Relation Age of Onset  . Pancreatic cancer Brother   . Heart disease Father    SOCIAL HISTORY:  reports that he quit smoking about 6 years ago. His smoking use included Cigarettes. He has a 6 pack-year smoking history. He has never used smokeless tobacco. He reports that he does not drink alcohol or use illicit drugs.  REVIEW OF SYSTEMS:   All systems reviewed and found  negative except for what I mentioned in the HPI.  SUBJECTIVE:   VITAL SIGNS: Temp:  [97.6 F (36.4 C)-98.1 F (36.7 C)] 97.6 F (36.4 C) (06/08 2214) Pulse Rate:  [63-88] 87 (06/08 2214) Resp:  [17-42] 22 (06/08 2214) BP: (88-146)/(59-113) 146/83 mmHg (06/08 2214) SpO2:  [86 %-96 %] 94 % (06/08 2214) Weight:  [168 lb 14.4 oz (76.613 kg)] 168 lb 14.4 oz (76.613 kg) (06/08 2214)     INTAKE / OUTPUT: Intake/Output     06/08 0701 - 06/09 0700   Urine (mL/kg/hr) 275   Total Output 275   Net -275         PHYSICAL  EXAMINATION: General: No apparent distress. Eyes: Anicteric sclerae. ENT: Oropharynx clear. Moist mucous membranes. No thrush Lymph: No cervical, supraclavicular, or axillary lymphadenopathy. Heart: Normal S1, S2. No murmurs, rubs, or gallops appreciated. No bruits, equal pulses. Lungs: Good air movement bilaterally, no wheezes. Bilateral diffuse fine crackles more pronounced to the bases. Normal upper airway sounds without evidence of stridor. Abdomen: Abdomen soft, non-tender and not distended, normoactive bowel sounds. No hepatosplenomegaly or masses. Musculoskeletal: No clubbing or synovitis. Skin: No rashes or lesions Neuro: No focal neurologic deficits.  LABS:  Recent Labs Lab 11/11/12 1402  HGB 16.2  WBC 8.9  PLT 314  NA 137  K 3.5  CL 103  CO2 24  GLUCOSE 70  BUN 14  CREATININE 1.30  CALCIUM 9.5  TROPONINI <0.30   No results found for this basename: GLUCAP,  in the last 168 hours  Ct angiogram: IMPRESSION:  No evidence of significant pulmonary embolus. Diffuse changes of  chronic interstitial lung disease. Suggestion of a acute  alveolitis in the lung bases. Prominent bilateral mediastinal and  hilar lymph nodes, likely reactive.   ASSESSMENT / PLAN:  PULMONARY A: 1) Community acquired pneumonia in the setting of underlying IPF. Other possibility includes progression of disease, but this is likely infectious since he had acute onset of productive cough and was likely at risk since he spent significant amounts of time in crowded places (cruise, airplanes).  P:   - Will admit to floor - Supplemental oxygen via [place] to keep O2 sat > 92% - Rocephin / Zithromax - Prednisone 60 mg daily  - Will consider pulsed steroids if no improvement with antibiotics - Duonebs q 6 hrs - Airway clearance - Sputum cultures ordered. - Will follow urine legionella, urine strep, respiratory viral panel  CARDIOVASCULAR A:  1) Hemodynamically stable 2) Hypertension P:  -  Continue home antihypertensives  RENAL A:   1) No issues   GASTROINTESTINAL A:   1) GERD P:   - PO protonix  HEMATOLOGIC A:   1) No issues   INFECTIOUS A:   1) No issues   ENDOCRINE A:   1) DM2 P:   - Continue Glipizide  NEUROLOGIC A:   1) No issues    I have personally obtained a history, examined the patient, evaluated laboratory and imaging results, formulated the assessment and plan and placed orders. Time devoted to patient care services described in this note is 45 minutes.   Waynetta Pean, MD Pulmonary and [place] Pager: [phone]  11/11/2012, 11:20 PM

## 2012-11-11 NOTE — ED Notes (Signed)
Patient transported to CT 

## 2012-11-11 NOTE — ED Notes (Signed)
Spoke with CT sts scan to be done in appx 15 minutes.

## 2012-11-11 NOTE — ED Notes (Signed)
EMS stated, pt had sudden onset of SOB while singing at church today.  Denies CP, or any other symptoms. Had 3 air flights within one week.  Pt.l had a breathing test on Tuesday for interstitial pneumonia.

## 2012-11-11 NOTE — ED Notes (Signed)
Pt. Up to walk with pulse ox with 3 L of oxygen around nurses station.  O2 sats stayed at 81%.  Dr. Wilson Singer aware of pt's O2 sats.  . Pt. To be admitted.

## 2012-11-11 NOTE — ED Notes (Signed)
Patient transported to X-ray 

## 2012-11-12 ENCOUNTER — Inpatient Hospital Stay (HOSPITAL_COMMUNITY): Admission: RE | Admit: 2012-11-12 | Payer: Managed Care, Other (non HMO) | Source: Ambulatory Visit

## 2012-11-12 DIAGNOSIS — R0989 Other specified symptoms and signs involving the circulatory and respiratory systems: Secondary | ICD-10-CM

## 2012-11-12 DIAGNOSIS — G4733 Obstructive sleep apnea (adult) (pediatric): Secondary | ICD-10-CM

## 2012-11-12 DIAGNOSIS — J189 Pneumonia, unspecified organism: Secondary | ICD-10-CM | POA: Diagnosis present

## 2012-11-12 DIAGNOSIS — J84111 Idiopathic interstitial pneumonia, not otherwise specified: Secondary | ICD-10-CM

## 2012-11-12 LAB — CBC
HCT: 45.6 % (ref 39.0–52.0)
Hemoglobin: 16 g/dL (ref 13.0–17.0)
MCV: 82.9 fL (ref 78.0–100.0)
Platelets: 302 10*3/uL (ref 150–400)
RBC: 5.5 MIL/uL (ref 4.22–5.81)
WBC: 5.9 10*3/uL (ref 4.0–10.5)

## 2012-11-12 LAB — BASIC METABOLIC PANEL
BUN: 14 mg/dL (ref 6–23)
CO2: 20 mEq/L (ref 19–32)
Chloride: 103 mEq/L (ref 96–112)
Creatinine, Ser: 1.05 mg/dL (ref 0.50–1.35)

## 2012-11-12 LAB — GLUCOSE, CAPILLARY
Glucose-Capillary: 135 mg/dL — ABNORMAL HIGH (ref 70–99)
Glucose-Capillary: 266 mg/dL — ABNORMAL HIGH (ref 70–99)

## 2012-11-12 LAB — HEMOGLOBIN A1C: Mean Plasma Glucose: 157 mg/dL — ABNORMAL HIGH (ref <117)

## 2012-11-12 LAB — LEGIONELLA ANTIGEN, URINE

## 2012-11-12 LAB — EXPECTORATED SPUTUM ASSESSMENT W GRAM STAIN, RFLX TO RESP C

## 2012-11-12 MED ORDER — INSULIN ASPART 100 UNIT/ML ~~LOC~~ SOLN
0.0000 [IU] | Freq: Three times a day (TID) | SUBCUTANEOUS | Status: DC
  Administered 2012-11-12 (×2): 2 [IU] via SUBCUTANEOUS
  Administered 2012-11-12: 1 [IU] via SUBCUTANEOUS

## 2012-11-12 MED ORDER — INSULIN ASPART 100 UNIT/ML ~~LOC~~ SOLN: 0.0000 [IU] | Freq: Every day | SUBCUTANEOUS | Status: DC

## 2012-11-12 MED ORDER — AZITHROMYCIN 500 MG PO TABS
500.0000 mg | ORAL_TABLET | ORAL | Status: DC
  Administered 2012-11-12: 500 mg via ORAL
  Filled 2012-11-12 (×2): qty 1

## 2012-11-12 NOTE — Progress Notes (Signed)
Patient says he feels better.  No SOB at rest. Eating breakfast.  SSI given for CBG of 145.

## 2012-11-12 NOTE — Progress Notes (Signed)
PULMONARY  / CRITICAL CARE MEDICINE  Name: [name] MRN: [id number] DOB: [birth year]    ADMISSION DATE:  11/11/2012  PRIMARY SERVICE: PCCM  CHIEF COMPLAINT:  SOB, cough, chills  BRIEF PATIENT DESCRIPTION:  62 years old male with known IPF. Presents with increased SOB, cough and chills.  SIGNIFICANT EVENTS / STUDIES:  CT angiogram 6/8 >> negative for PE, extensive ILD changes with superimposed GG pattern in the bases  LINES / TUBES: - Peripheral IV's  CULTURES: Sputum cultures 6/8 >>   ANTIBIOTICS: Rocephin 6/8 >>  Zithromax 6/8 >>   HISTORY OF PRESENT ILLNESS:   62 years old with PMH relevant for HTN, GERD, OSA, DM and IPF on PRN oxygen at home. Presents with about a week of progressive SOB, increased new cough with sputum production and occasional chills. No fever documented. Of note. The patient traveled to [place] for a cruise and spent a significant amount of time on airplanes. His symptoms started on his way back from [place]. At the time of my exam the patient is awake, alert, oriented x 3, hemodynamically stable and feeling much better. He is saturating 94% on 2 L of O2 via [place].  SUBJECTIVE:  Feels better, less phlegm, less cough  VITAL SIGNS: Temp:  [97.4 F (36.3 C)-98.1 F (36.7 C)] 97.4 F (36.3 C) (06/09 0535) Pulse Rate:  [63-88] 76 (06/09 0535) Resp:  [17-42] 20 (06/09 0535) BP: (88-146)/(59-113) 143/90 mmHg (06/09 0535) SpO2:  [86 %-96 %] 92 % (06/09 0535) Weight:  [76 kg (167 lb 8.8 oz)-76.613 kg (168 lb 14.4 oz)] 76 kg (167 lb 8.8 oz) (06/09 0535)     INTAKE / OUTPUT: Intake/Output     06/08 0701 - 06/09 0700 06/09 0701 - 06/10 0700   P.O.  600   Total Intake(mL/kg)  600 (7.9)   Urine (mL/kg/hr) 650    Total Output 650     Net -650 +600          PHYSICAL EXAMINATION: General: No apparent distress. Eyes: Anicteric sclerae. ENT: Oropharynx clear. Moist mucous membranes. No thrush Lymph: No cervical, supraclavicular, or axillary  lymphadenopathy. Heart: Normal S1, S2. No murmurs, rubs, or gallops appreciated. No bruits, equal pulses. Lungs: Good air movement bilaterally, no wheezes. Bilateral diffuse fine crackles more pronounced to the bases. Normal upper airway sounds without evidence of stridor. Abdomen: Abdomen soft, non-tender and not distended, normoactive bowel sounds. No hepatosplenomegaly or masses. Musculoskeletal: No clubbing or synovitis. Skin: No rashes or lesions Neuro: No focal neurologic deficits.  LABS:  Recent Labs Lab 11/11/12 1402 11/12/12 0520  HGB 16.2 16.0  WBC 8.9 5.9  PLT 314 302  NA 137 135  K 3.5 4.2  CL 103 103  CO2 24 20  GLUCOSE 70 174*  BUN 14 14  CREATININE 1.30 1.05  CALCIUM 9.5 9.3  TROPONINI <0.30  --     Recent Labs Lab 11/12/12 0042 11/12/12 0638  GLUCAP 266* 145*    Ct angiogram: IMPRESSION:  No evidence of significant pulmonary embolus. Diffuse changes of  chronic interstitial lung disease. Suggestion of a acute  alveolitis in the lung bases. Prominent bilateral mediastinal and  hilar lymph nodes, likely reactive.   ASSESSMENT / PLAN:  PULMONARY A: 1) Possible community acquired pneumonia vs viral pneumonitis in the setting of underlying IPF. Other possibility includes flare or progression of disease, but this is likely infectious since he had acute onset of productive cough and was likely at risk since he spent significant amounts  of time in crowded places (cruise, airplanes).  P:   - Supplemental oxygen via [place] to keep O2 sat > 92% - empiric Rocephin / Zithromax - Prednisone 60 mg daily, taper depending on clinical and radiographical progress - Duonebs q 6 hrs, change to prn on 6/10 - Airway clearance - Sputum cultures ordered. - Will follow urine legionella, urine strep, respiratory viral panel  CARDIOVASCULAR A:  1) Hemodynamically stable 2) Hypertension P:  - Continue home antihypertensives  RENAL A:   1) No  issues   GASTROINTESTINAL A:   1) GERD P:   - PO protonix  HEMATOLOGIC A:   1) No issues   INFECTIOUS A:   1) No issues   ENDOCRINE A:   1) DM2 P:   - Continue Glipizide  NEUROLOGIC A:   1) No issues   I have personally obtained a history, examined the patient, evaluated laboratory and imaging results, formulated the assessment and plan and placed orders.  Baltazar Apo, MD, PhD 11/12/2012, 11:17 AM [facility] [phone] or if no answer [phone]

## 2012-11-12 NOTE — Progress Notes (Signed)
Hyperglycemia   SSI ordered 

## 2012-11-12 NOTE — Progress Notes (Signed)
Utilization Review Completed.   Daryan Cagley, RN, BSN Nurse Case Manager  [phone]  

## 2012-11-13 DIAGNOSIS — J8410 Pulmonary fibrosis, unspecified: Secondary | ICD-10-CM

## 2012-11-13 LAB — RESPIRATORY VIRUS PANEL
Influenza A: NOT DETECTED
Influenza B: DETECTED — AB
Parainfluenza 1: NOT DETECTED
Parainfluenza 2: NOT DETECTED
Respiratory Syncytial Virus B: NOT DETECTED

## 2012-11-13 LAB — GLUCOSE, CAPILLARY
Glucose-Capillary: 70 mg/dL (ref 70–99)
Glucose-Capillary: 131 mg/dL — ABNORMAL HIGH (ref 70–99)

## 2012-11-13 LAB — BORDETELLA PERTUSSIS PCR: B pertussis, DNA: NOT DETECTED

## 2012-11-13 MED ORDER — AMOXICILLIN-POT CLAVULANATE 875-125 MG PO TABS: 1.0000 | ORAL_TABLET | Freq: Two times a day (BID) | ORAL | Status: DC

## 2012-11-13 MED ORDER — PREDNISONE 20 MG PO TABS: ORAL_TABLET | ORAL | Status: DC

## 2012-11-13 NOTE — Discharge Summary (Signed)
Pt seen and examined.  See my full note earlier in the day.  Agree with above.

## 2012-11-13 NOTE — Plan of Care (Signed)
Problem: Phase I Progression Outcomes Goal: OOB with assistance Outcome: Not Applicable Date Met:  [id number] Wrong careplan for pt  Problem: Phase II Progression Outcomes Goal: No evidence of ICD malfunction Outcome: Not Applicable Date Met:  [id number] Wrong care plan

## 2012-11-13 NOTE — Discharge Summary (Signed)
Physician Discharge Summary     Patient ID: [name] MRN: [id number] DOB/AGE: Nov 01, 1950 62 y.o.  Admit date: 11/11/2012 Discharge date: 11/13/2012  Admission Diagnoses: Cough, fever and chills: presumed CAP superimposed on underlying ILD   Discharge Diagnoses:  Active Problems:   Pneumonia   ILD  Significant Hospital tests/ studies/ interventions and procedures   SIGNIFICANT EVENTS / STUDIES:  CT angiogram 6/8 >> negative for PE, extensive ILD changes with superimposed GG pattern in the bases  Hospital Course/ discharge diagnoses:  62 years old with PMH relevant for HTN, GERD, OSA, DM and IPF on PRN oxygen at home. Presents with about a week of progressive SOB, increased new cough with sputum production and occasional chills. No fever documented. Of note. The patient traveled to [place] for a cruise and spent a significant amount of time on airplanes. His symptoms started on his way back from [place]. At the time of my exam the patient is awake, alert, oriented x 3, hemodynamically stable and feeling much better. He is saturating 94% on 2 L of O2 via [place].  Possible community acquired pneumonia in the setting of underlying IPF.  Other possibility includes flare or progression of disease, but this is likely infectious since he had acute onset of productive cough and was likely at risk since he spent significant amounts of time in crowded places (cruise, airplanes).  He is significantly improved since being treated with abx and prednisone. Suspect this is CAP. He feels he is nearly back to baseline, and oxygen requirements have greatly decreased.  plan -will discharge home today on prednisone 40mg  each day, as well as augmentin 875mg  bid for next 7days.  -continue usual home oxygen  -followup in 2 weeks.  Discharge Exam: BP 129/85  Pulse 70  Temp(Src) 98.3 F (36.8 C) (Oral)  Resp 20  Ht 5\' 4"  (1.626 m)  Wt 76.476 kg (168 lb 9.6 oz)  BMI 28.93 kg/m2  SpO2  100% PHYSICAL EXAMINATION:  Well developed male in nad  Nose without purulence or discharge noted.  Neck without LN or tmg  Chest with basilar crackles, right > left, no wheezing  Cor with rrr  LE without significant edema, no cyanosis  Alert and oriented, moves all 4.    Labs at discharge Lab Results  Component Value Date   CREATININE 1.05 11/12/2012   BUN 14 11/12/2012   NA 135 11/12/2012   K 4.2 11/12/2012   CL 103 11/12/2012   CO2 20 11/12/2012   Lab Results  Component Value Date   WBC 5.9 11/12/2012   HGB 16.0 11/12/2012   HCT 45.6 11/12/2012   MCV 82.9 11/12/2012   PLT 302 11/12/2012   Lab Results  Component Value Date   ALT 37 04/09/2009   AST 55* 04/09/2009   ALKPHOS 75 04/09/2009   BILITOT 0.9 04/09/2009   Lab Results  Component Value Date   INR 0.92 04/03/2009    Current radiology studies Dg Chest 2 View  11/11/2012   *RADIOLOGY REPORT*  Clinical Data: Shortness of breath.  CHEST - 2 VIEW  Comparison: Multiple priors, most recently 10/04/2012.  Findings: As with prior examinations, lung volumes are slightly low and there is a very coarse diffuse reticulonodular pattern throughout the lungs bilaterally, favored to represent a chronic interstitial lung disease.  The amount of apparent nodularity in this pattern does appear increased, however, particularly throughout the lung bases bilaterally.  The possibility of the malignant nodules from metastatic disease is not excluded.  No definite acute consolidative airspace disease.  No definite evidence of pulmonary edema.  Heart size appears upper limits of normal. The patient is rotated to the right on today's exam, resulting in distortion of the mediastinal contours and reduced diagnostic sensitivity and specificity for mediastinal pathology.  IMPRESSION: 1.  The appearance of the chest is strongly suggestive of an underlying interstitial lung disease, which appears worse than the prior examination.  In particular, there are increasing nodular  opacities throughout the lower lungs bilaterally.  While this may simply be a manifestation of the underlying interstitial lung disease, superimposed nodularity from metastases or infection is difficult to entirely exclude.  For better characterization of these findings, a high-resolution noncontrast chest CT is suggested.   Original Report Authenticated By: Vinnie Langton, M.D.   Ct Chest Wo Contrast  11/11/2012   *RADIOLOGY REPORT*  Clinical Data: Sudden onset short of breath.  No chest pain.  CT CHEST WITHOUT CONTRAST  Technique:  Multidetector CT imaging of the chest was performed following the standard protocol without IV contrast.  Comparison: Chest radiograph today.  Findings: Chronic interstitial lung disease is present.  The fibrosis is relatively uniform although there is some sparing of the left base.  Borderline prevascular lymph node measures 1 cm short axis.  Subcarinal lymph node measures 13 mm short axis. Coronary artery atherosclerosis is present. If office based assessment of coronary risk factors has not been performed, it is now recommended.  No pericardial or pleural effusion.  No definite superimposed pneumonia.  There appear to be lungs staples in the right lower lobe, suggesting prior wedge resection or biopsy. Emphysematous changes are present at the lung apices.  Incidental imaging of the upper abdomen is within normal limits.  Thoracic spondylosis is present.  Sternomanubrial degenerative disease.  No aggressive osseous lesions.  Noncontrast appearance of the aorta is within normal limits.  Small hiatal hernia and patulous gastroesophageal junction.  IMPRESSION: Findings compatible with chronic interstitial lung disease.  The appearance is nonspecific.  Differential considerations include IPF/UIP, combined pulmonary fibrosis and emphysema (CPFE), fibrotic nonspecific interstitial pneumonia and pulmonary sarcoidosis. Borderline lymph nodes are probably reactive to chronic inflammatory  changes.  No convincing evidence of bronchogenic carcinoma or metastatic disease to the chest.   Original Report Authenticated By: Dereck Ligas, M.D.   Ct Angio Chest W/cm &/or Wo Cm  11/11/2012   *RADIOLOGY REPORT*  Clinical Data: Hypoxia after recent airplane travel.  Shortness of breath.  Chest pressure.  Weakness.  History of interstitial pneumonia since 2010.  CT ANGIOGRAPHY CHEST  Technique:  Multidetector CT imaging of the chest using the standard protocol during bolus administration of intravenous contrast. Multiplanar reconstructed images including MIPs were obtained and reviewed to evaluate the vascular anatomy.  Contrast: 158mL OMNIPAQUE IOHEXOL 350 MG/ML SOLN  Comparison: 11/11/2012  Findings: Technically adequate study with good opacification of the central and segmental pulmonary arteries.  No focal filling defects are identified.  No evidence of significant pulmonary embolus.  Mild cardiac enlargement.  Normal caliber thoracic aorta without dissection.  Mild aortic calcification.  Bilateral mediastinal and hilar lymph nodes diffusely are enlarged, similar to previous study.  Aortopulmonic window lymph node measures 11 mm diameter. Changes are likely reactive.  Esophagus is decompressed.  No abnormal mediastinal fluid collection.  Visualization of the lungs is limited due to respiratory motion artifact but there is again demonstrated diffuse bilateral interstitial infiltration with focal bullous emphysematous changes and honeycombing change.  Changes are consistent with chronic interstitial lung disease  and may represent usual interstitial pneumonitis or interstitial fibrosis associated emphysema.  No significant bronchiectasis.  Since the previous study, there appears to be some interval development of mild ground-glass infiltration in the lung bases.  This could be due to respiratory motion artifact but acute alveolitis is not excluded.  Visualized portions of the upper abdominal organs are  grossly unremarkable.  Degenerative changes in the thoracic spine.  IMPRESSION: No evidence of significant pulmonary embolus.  Diffuse changes of chronic interstitial lung disease.  Suggestion of a acute alveolitis in the lung bases.  Prominent bilateral mediastinal and hilar lymph nodes, likely reactive.   Original Report Authenticated By: Lucienne Capers, M.D.    Disposition:  home       Discharge Orders   Future Appointments Provider Department Dept Phone   11/28/2012 1:30 PM Kathee Delton, MD [facility] [phone]   04/09/2013 10:00 AM Kathee Delton, MD [facility] [phone]   Future Orders Complete By Expires     Diet - low sodium heart healthy  As directed     For home use only DME oxygen  As directed     Questions:      Mode or (Route):  Nasal cannula    Liters per Minute:  2    Frequency:      Oxygen conserving device:      Increase activity slowly  As directed         Medication List    TAKE these medications       amLODipine-valsartan 5-160 MG per tablet  Commonly known as:  EXFORGE  Take 1 tablet by mouth daily.     amoxicillin-clavulanate 875-125 MG per tablet  Commonly known as:  AUGMENTIN  Take 1 tablet by mouth 2 (two) times daily.     aspirin 81 MG tablet  Take 81 mg by mouth daily.     atorvastatin 20 MG tablet  Commonly known as:  LIPITOR  Take 20 mg by mouth daily. 1 by mouth daily     chlorpheniramine 4 MG tablet  Commonly known as:  CHLOR-TRIMETON  Take 4 mg by mouth 2 (two) times daily as needed.     dextromethorphan-guaiFENesin 30-600 MG per 12 hr tablet  Commonly known as:  MUCINEX DM  Take 1 tablet by mouth daily as needed (for lung clearance).     GLIPIZIDE XL 5 MG 24 hr tablet  Generic drug:  glipiZIDE  Take 5 mg by mouth daily. 1 by mouth daily     multivitamin capsule  Take 1 capsule by mouth daily.     omeprazole 40 MG capsule  Commonly known as:  PRILOSEC  Take 40 mg by mouth daily.     predniSONE  20 MG tablet  Commonly known as:  DELTASONE  Take 2 tabs daily until instructed otherwise by Dr Gwenette Greet     Vitamin D 1000 UNITS capsule  Take 1,000 Units by mouth daily.       Follow-up Information   Follow up with Kathee Delton, MD On 11/28/2012. (130pm)    Contact information:   [place] [address] [phone]       Discharged Condition: good  Physician Statement:   The Patient was personally examined, the discharge assessment and plan has been personally reviewed and I agree with ACNP Donelle Baba's assessment and plan. > 30 minutes of time have been dedicated to discharge assessment, planning and discharge instructions.   Signed: Cambreigh Dearing,PETE 11/13/2012, 11:39 AM

## 2012-11-13 NOTE — Progress Notes (Signed)
PULMONARY  / CRITICAL CARE MEDICINE  Name: [name] MRN: [id number] DOB: August 27, 1950    ADMISSION DATE:  11/11/2012  PRIMARY SERVICE: PCCM  CHIEF COMPLAINT:  SOB, cough, chills  BRIEF PATIENT DESCRIPTION:  62 years old male with known IPF. Presents with increased SOB, cough and chills.  SIGNIFICANT EVENTS / STUDIES:  CT angiogram 6/8 >> negative for PE, extensive ILD changes with superimposed GG pattern in the bases  LINES / TUBES: - Peripheral IV's  CULTURES: Sputum cultures 6/8 >>   ANTIBIOTICS: Rocephin 6/8 >>  Zithromax 6/8 >>   HISTORY OF PRESENT ILLNESS:   62 years old with PMH relevant for HTN, GERD, OSA, DM and IPF on PRN oxygen at home. Presents with about a week of progressive SOB, increased new cough with sputum production and occasional chills. No fever documented. Of note. The patient traveled to [place] for a cruise and spent a significant amount of time on airplanes. His symptoms started on his way back from [place]. At the time of my exam the patient is awake, alert, oriented x 3, hemodynamically stable and feeling much better. He is saturating 94% on 2 L of O2 via [place].  SUBJECTIVE:  Feels much improved and wants to go home.  Decreased congestion and mucus.   VITAL SIGNS: Temp:  [97.3 F (36.3 C)-98.3 F (36.8 C)] 98.3 F (36.8 C) (06/10 0620) Pulse Rate:  [70-89] 70 (06/10 0620) Resp:  [20-21] 20 (06/10 0620) BP: (116-133)/(67-96) 129/85 mmHg (06/10 0620) SpO2:  [92 %-100 %] 100 % (06/10 0620) Weight:  [76.476 kg (168 lb 9.6 oz)] 76.476 kg (168 lb 9.6 oz) (06/10 0620)     INTAKE / OUTPUT: Intake/Output     06/09 0701 - 06/10 0700 06/10 0701 - 06/11 0700   P.O. 1200    I.V. (mL/kg) 3 (0)    Total Intake(mL/kg) 1203 (15.7)    Urine (mL/kg/hr) 1100 (0.6)    Total Output 1100     Net +103            PHYSICAL EXAMINATION: Well developed male in nad Nose without purulence or discharge noted. Neck without LN or tmg Chest with basilar  crackles, right > left, no wheezing Cor with rrr LE without significant edema, no cyanosis Alert and oriented, moves all 4.   LABS:  Recent Labs Lab 11/11/12 1402 11/12/12 0520  HGB 16.2 16.0  WBC 8.9 5.9  PLT 314 302  NA 137 135  K 3.5 4.2  CL 103 103  CO2 24 20  GLUCOSE 70 174*  BUN 14 14  CREATININE 1.30 1.05  CALCIUM 9.5 9.3  TROPONINI <0.30  --     Recent Labs Lab 11/12/12 0638 11/12/12 1104 11/12/12 1647 11/12/12 2134 11/13/12 0554  GLUCAP 145* 135* 171* 116* 70    Ct angiogram: IMPRESSION:  No evidence of significant pulmonary embolus. Diffuse changes of  chronic interstitial lung disease. Suggestion of a acute  alveolitis in the lung bases. Prominent bilateral mediastinal and  hilar lymph nodes, likely reactive.   ASSESSMENT / PLAN:  PULMONARY A: 1) Possible community acquired pneumonia in the setting of underlying IPF. Other possibility includes flare or progression of disease, but this is likely infectious since he had acute onset of productive cough and was likely at risk since he spent significant amounts of time in crowded places (cruise, airplanes).  He is significantly improved since being treated with abx and prednisone.  I suspect this is CAP.  He feels he is  nearly back to baseline, and oxygen requirements have greatly decreased. -will discharge home today on prednisone 40mg  each day, as well as augmentin 875mg  bid for next 7days. -continue usual home oxygen -followup with me in 2 weeks.

## 2012-11-14 LAB — CULTURE, RESPIRATORY W GRAM STAIN

## 2012-11-15 NOTE — ED Provider Notes (Signed)
Medical screening examination/treatment/procedure(s) were conducted as a shared visit with non-physician practitioner(s) and myself.  I personally evaluated the patient during the encounter  Virgel Manifold, MD 11/15/12 [phone]

## 2012-11-16 LAB — ADENOVIRUS ANTIBODIES: Adenovirus Antibody: <1:8 {titer}

## 2012-11-27 ENCOUNTER — Inpatient Hospital Stay: Payer: Managed Care, Other (non HMO) | Admitting: Adult Health

## 2012-11-28 ENCOUNTER — Ambulatory Visit (INDEPENDENT_AMBULATORY_CARE_PROVIDER_SITE_OTHER)
Admission: RE | Admit: 2012-11-28 | Discharge: 2012-11-28 | Disposition: A | Discharge status: Home / Self Care | Payer: Managed Care, Other (non HMO) | Source: Ambulatory Visit | Attending: Pulmonary Disease | Admitting: Pulmonary Disease

## 2012-11-28 ENCOUNTER — Ambulatory Visit (INDEPENDENT_AMBULATORY_CARE_PROVIDER_SITE_OTHER): Payer: Managed Care, Other (non HMO) | Admitting: Pulmonary Disease

## 2012-11-28 ENCOUNTER — Encounter: Payer: Self-pay | Admitting: Pulmonary Disease

## 2012-11-28 VITALS — BP 140/80 | HR 71 | Temp 96.8°F | Ht 63.0 in | Wt 174.6 lb

## 2012-11-28 DIAGNOSIS — J84111 Idiopathic interstitial pneumonia, not otherwise specified: Secondary | ICD-10-CM

## 2012-11-28 NOTE — Progress Notes (Signed)
  Subjective:    Patient ID: [name], male    DOB: March 22, 1951, 62 y.o.   MRN: [id number]  HPI Patient comes in today for followup of his idiopathic pulmonary fibrosis.  He was recently in the hospital with pulmonary infiltrates and worsening hypoxemia.  He just returned from a trip to [place], and his history was most consistent with either a viral or bacterial pneumonia.  However, we could not totally exclude a flare of his initial lung disease.  He was treated with antibiotics, as well as prednisone.  He comes in today where he is doing very well from a pulmonary standpoint, and feels that he is returned to his usual baseline.  He denies any significant cough or mucus production.  He had pulmonary function studies while ill which were worse than his usual baseline, however I do not think these represent his true function.   Review of Systems  Constitutional: Negative for fever and unexpected weight change.  HENT: Negative for ear pain, nosebleeds, congestion, sore throat, rhinorrhea, sneezing, trouble swallowing, dental problem, postnasal drip and sinus pressure.   Eyes: Negative for redness and itching.  Respiratory: Positive for shortness of breath. Negative for cough, chest tightness and wheezing.   Cardiovascular: Negative for palpitations and leg swelling.  Gastrointestinal: Negative for nausea and vomiting.  Genitourinary: Negative for dysuria.  Musculoskeletal: Negative for joint swelling.  Skin: Negative for rash.  Neurological: Negative for headaches.  Hematological: Does not bruise/bleed easily.  Psychiatric/Behavioral: Negative for dysphoric mood. The patient is not nervous/anxious.        Objective:   Physical Exam Well-developed male in no acute distress Nose without purulence or discharge noted Neck without lymphadenopathy or thyromegaly Chest with crackles one third the way up bilaterally, no wheezing Cardiac exam with regular rate and rhythm Lower extremities  without edema, cyanosis Alert and oriented, moves all 4 extremities.       Assessment & Plan:

## 2012-11-28 NOTE — Patient Instructions (Addendum)
Would like you to decrease your prednisone to 30mg  each day for one week, then 20mg  each day for one week, then 10mg  each day for one week, then stop. Please call pulmonary rehab, and arrange a time to start back. Will check cxr today, and will call you with results. Will reschedule you for pulmonary function tests to see if you qualify for the drug study we discussed.  Will get this in 2 weeks.

## 2012-11-28 NOTE — Assessment & Plan Note (Signed)
The patient is back to his usual baseline after a viral or bacterial pneumonia, although I cannot exclude a flare of his IPF.  He is currently on high-dose steroids, and I would like to get him off this medication.  He will also need to get back in pulmonary rehabilitation, and will need to have repeat pulmonary function studies to see if he qualifies for the pirfenidone trial

## 2012-12-03 ENCOUNTER — Encounter (HOSPITAL_COMMUNITY): Payer: Self-pay

## 2012-12-03 ENCOUNTER — Encounter (HOSPITAL_COMMUNITY)
Admission: RE | Admit: 2012-12-03 | Discharge: 2012-12-03 | Disposition: A | Discharge status: Home / Self Care | Payer: Managed Care, Other (non HMO) | Source: Ambulatory Visit | Attending: Pulmonary Disease | Admitting: Pulmonary Disease

## 2012-12-03 NOTE — Progress Notes (Addendum)
  Mr Esteves arrived today for orientation to Pulmonary Rehab.  He is accompanied by his wife and grandson.  He brought his portable oxygen, however he only uses when he feel the need.  He is very neatly dressed, very pleasant and talkative, skin warm and dry, heart rate regular. Oriented x4.  Very positive about his prognosis and anxious to start rehab so that he may do more yard work.  Nurse assessment done, review of QOL forms and medications.  Demonstration and practice of PLB using pulse oximeter.  Patient able to return demonstration satisfactorily.  Safety and hand hygiene in the exercise area reviewed with patient.  Patient voices understanding.  He will be here on 12/04/12 for walk test and plans to start exercise on 12/11/12.  WE look forward to assisting this very nice gentleman.  Philip Aspen RN  4:15 to4:55 pm.

## 2012-12-04 ENCOUNTER — Ambulatory Visit (HOSPITAL_COMMUNITY): Payer: Managed Care, Other (non HMO)

## 2012-12-06 ENCOUNTER — Ambulatory Visit (HOSPITAL_COMMUNITY)
Admission: RE | Admit: 2012-12-06 | Discharge: 2012-12-06 | Disposition: A | Discharge status: Home / Self Care | Payer: Managed Care, Other (non HMO) | Source: Ambulatory Visit | Attending: Pulmonary Disease | Admitting: Pulmonary Disease

## 2012-12-06 DIAGNOSIS — J84111 Idiopathic interstitial pneumonia, not otherwise specified: Secondary | ICD-10-CM

## 2012-12-06 DIAGNOSIS — R0602 Shortness of breath: Secondary | ICD-10-CM | POA: Insufficient documentation

## 2012-12-06 LAB — PULMONARY FUNCTION TEST

## 2012-12-06 MED ORDER — ALBUTEROL SULFATE (5 MG/ML) 0.5% IN NEBU
2.5000 mg | INHALATION_SOLUTION | Freq: Once | RESPIRATORY_TRACT | Status: AC
  Administered 2012-12-06: 2.5 mg via RESPIRATORY_TRACT

## 2012-12-11 ENCOUNTER — Encounter (HOSPITAL_COMMUNITY)
Admission: RE | Admit: 2012-12-11 | Discharge: 2012-12-11 | Disposition: A | Discharge status: Home / Self Care | Payer: Managed Care, Other (non HMO) | Source: Ambulatory Visit | Attending: Pulmonary Disease | Admitting: Pulmonary Disease

## 2012-12-11 DIAGNOSIS — I10 Essential (primary) hypertension: Secondary | ICD-10-CM | POA: Insufficient documentation

## 2012-12-11 DIAGNOSIS — G4733 Obstructive sleep apnea (adult) (pediatric): Secondary | ICD-10-CM | POA: Insufficient documentation

## 2012-12-11 DIAGNOSIS — I359 Nonrheumatic aortic valve disorder, unspecified: Secondary | ICD-10-CM | POA: Insufficient documentation

## 2012-12-11 DIAGNOSIS — Z5189 Encounter for other specified aftercare: Secondary | ICD-10-CM | POA: Insufficient documentation

## 2012-12-11 DIAGNOSIS — J8410 Pulmonary fibrosis, unspecified: Secondary | ICD-10-CM | POA: Insufficient documentation

## 2012-12-11 LAB — GLUCOSE, CAPILLARY: Glucose-Capillary: 131 mg/dL — ABNORMAL HIGH (ref 70–99)

## 2012-12-13 ENCOUNTER — Encounter (HOSPITAL_COMMUNITY)
Admission: RE | Admit: 2012-12-13 | Discharge: 2012-12-13 | Disposition: A | Discharge status: Home / Self Care | Payer: Managed Care, Other (non HMO) | Source: Ambulatory Visit | Attending: Pulmonary Disease | Admitting: Pulmonary Disease

## 2012-12-18 ENCOUNTER — Encounter (HOSPITAL_COMMUNITY): Payer: Managed Care, Other (non HMO)

## 2012-12-20 ENCOUNTER — Encounter (HOSPITAL_COMMUNITY)
Admission: RE | Admit: 2012-12-20 | Discharge: 2012-12-20 | Disposition: A | Discharge status: Home / Self Care | Payer: Managed Care, Other (non HMO) | Source: Ambulatory Visit | Attending: Pulmonary Disease | Admitting: Pulmonary Disease

## 2012-12-20 NOTE — Progress Notes (Signed)
[name] 62 y.o. male Nutrition Note Spoke with pt. Pt is overweight. Pt wants to lose wt and has been decreasing portions eaten to assist with wt loss. Pt eats 2 meals a day, which are generally "breakfast and dinner." Pt reports he "forgets to eat lunch" when he is busy. Techniques to eat more frequently discussed. Pt is making healthy food choices the majority of the time.  Pt reports he and his wife have been eating healthier over the last year since he was diagnosed with DM. Pt's Rate Your Plate results reviewed with pt.  Pt avoids many salty foods.  Pt does not add salt to food.  The role of sodium in lung disease reviewed with pt.  Pt is diabetic.  Pt checks fasting CBG's daily.  CBG's reportedly 109-119 mg/dL.  Pt's last A1c indicates blood glucose not optimally controlled. Hemoglobin A1c, action of DM medication, and hypoglycemia signs, symptoms and treatment discussed. Per pt, he saw an RD at his MD office 2 times after his dx of DM. Pt encouraged to attend DM class offered. Pt expressed understanding of the information reviewed.  Nutrition Diagnosis   Food-and nutrition-related knowledge deficit related to lack of exposure to information as related to diagnosis of pulmonary disease   Overweight related to excessive energy intake as evidenced by a BMI of 28.6 Nutrition Rx/Est. Daily Nutrition Needs for: ? wt loss 1250-1750 Kcal  80-105 gm protein   1500 mg or less sodium     150-175 gm CHO Nutrition Intervention   Pt's individual nutrition plan and goals reviewed with pt.   Benefits of adopting healthy eating habits discussed when pt's Rate Your Plate reviewed.   Pt to attend the Nutrition and Lung Disease class   Continual client-centered nutrition education by RD, as part of interdisciplinary care. Goal(s) 1. Identify food quantities necessary to achieve wt loss of  -2# per week to a goal wt of 65.8-74.0 kg (144-162 lb) at graduation from pulmonary rehab. 2. CBG's in the normal  range or as close to normal as is safely possible. Monitor and Evaluate progress toward nutrition goal with team.   Derek Mound, M.Ed, RD, LDN, CDE 12/20/2012 1:28 PM

## 2012-12-25 ENCOUNTER — Encounter (HOSPITAL_COMMUNITY)
Admission: RE | Admit: 2012-12-25 | Discharge: 2012-12-25 | Disposition: A | Discharge status: Home / Self Care | Payer: Managed Care, Other (non HMO) | Source: Ambulatory Visit | Attending: Pulmonary Disease | Admitting: Pulmonary Disease

## 2012-12-27 ENCOUNTER — Encounter (HOSPITAL_COMMUNITY)
Admission: RE | Admit: 2012-12-27 | Discharge: 2012-12-27 | Disposition: A | Discharge status: Home / Self Care | Payer: Managed Care, Other (non HMO) | Source: Ambulatory Visit | Attending: Pulmonary Disease | Admitting: Pulmonary Disease

## 2013-01-01 ENCOUNTER — Ambulatory Visit (INDEPENDENT_AMBULATORY_CARE_PROVIDER_SITE_OTHER): Payer: Managed Care, Other (non HMO) | Admitting: Internal Medicine

## 2013-01-01 ENCOUNTER — Encounter (HOSPITAL_COMMUNITY)
Admission: RE | Admit: 2013-01-01 | Discharge: 2013-01-01 | Disposition: A | Discharge status: Home / Self Care | Payer: Managed Care, Other (non HMO) | Source: Ambulatory Visit | Attending: Pulmonary Disease | Admitting: Pulmonary Disease

## 2013-01-01 ENCOUNTER — Encounter: Payer: Self-pay | Admitting: Internal Medicine

## 2013-01-01 VITALS — BP 104/70 | HR 77 | Temp 97.7°F | Ht 63.5 in | Wt 173.4 lb

## 2013-01-01 DIAGNOSIS — J84111 Idiopathic interstitial pneumonia, not otherwise specified: Secondary | ICD-10-CM

## 2013-01-01 MED ORDER — AMOXICILLIN-POT CLAVULANATE 875-125 MG PO TABS: 1.0000 | ORAL_TABLET | Freq: Two times a day (BID) | ORAL | Status: DC

## 2013-01-01 MED ORDER — PREDNISONE 10 MG PO TABS: ORAL_TABLET | ORAL | Status: DC

## 2013-01-01 NOTE — Patient Instructions (Addendum)
augmentin one twice daily x 10 days  mucinex dm 1200mg  every 12 hours as needed for cough  Prednisone 10 mg 2 daily until better, then 1 daily for a week, then one half daily

## 2013-01-01 NOTE — Assessment & Plan Note (Addendum)
Flared off prednisone with some green sputum raising the issue of assoc sinusitis or bronchiectasis  The goal with a chronic steroid dependent illness is always arriving at the lowest effective dose that controls the disease/symptoms and not accepting a set "formula" which is based on statistics or guidelines that don't always take into account patient  variability or the natural hx of the dz in every individual patient, which may well vary over time.  For now therefore I recommend the patient maintain  A ceiling of 20 mg per day and a floor of 5 mg daily until he regroups with Dr Gwenette Greet  In meantime rx empirically with augmentin x 10 days

## 2013-01-01 NOTE — Progress Notes (Signed)
Subjective:     Patient ID: [name], male   DOB: February 02, 1951 MRN: [id number]  HPI  73 yobm quit smoking around 2004 followed by [facility] for PF  01/01/2013 baseline = able to walk [employer] s 02 and back to baseline on 40 mg per day  And [facility] rec taper off by 10 mg per week effective 6/25 so off about a week prior to onset of acute symptoms  Chief Complaint  Patient presents with  . ACUTE OFFICE VISIT    [place] pt. Pt c/o increased sob, prod cough with green mucus, low grade fever, low O2 sats 74%-84% (sunday). Pt states that has not been taking anything OTC for these symptoms.  89% O2 upon arrival.   on prilosec 40 before bfast. Exp to sick child prior to gradually worsening doe and cough with some sinus and ear congestion not better with mucinex.  No obvious daytime variabilty or assoc   cp or chest tightness, subjective wheeze overt   hb symptoms. No unusual exp hx or h/o childhood pna/ asthma or knowledge of premature birth.    Sleeping ok without nocturnal  or early am exacerbation  of respiratory  c/o's or need for noct saba. Also denies any obvious fluctuation of symptoms with weather or environmental changes or other aggravating or alleviating factors except as outlined above   Current Medications, Allergies, Past Medical History, Past Surgical History, Family History, and Social History were reviewed in [employer] record.  ROS  The following are not active complaints unless bolded sore throat, dysphagia, dental problems, itching, sneezing,  nasal congestion or excess/ purulent secretions, ear ache,   fever, chills, sweats, unintended wt loss, pleuritic or exertional cp, hemoptysis,  orthopnea pnd or leg swelling, presyncope, palpitations, heartburn, abdominal pain, anorexia, nausea, vomiting, diarrhea  or change in bowel or urinary habits, change in stools or urine, dysuria,hematuria,  rash, arthralgias, visual complaints, headache, numbness weakness or ataxia or  problems with walking or coordination,  change in mood/affect or memory.      Review of Systems     Objective:   Physical Exam    pleasant amb bm nad Wt Readings from Last 3 Encounters:  01/01/13 173 lb 6.4 oz (78.654 kg)  11/28/12 174 lb 9.6 oz (79.198 kg)  11/13/12 168 lb 9.6 oz (76.476 kg)     HEENT: nl dentition, turbinates, and orophanx. Nl external ear canals without cough reflex   NECK :  without JVD/Nodes/TM/ nl carotid upstrokes bilaterally   LUNGS: no acc muscle use, clear to A and P bilaterally without cough on insp or exp maneuvers with bilateral insp crackles bases to 1/3 up posteriorly   CV:  RRR  no s3 or murmur or increase in P2, no edema   ABD:  soft and nontender with nl excursion in the supine position. No bruits or organomegaly, bowel sounds nl  MS:  warm without deformities, calf tenderness, cyanosis .  Mod clubbing  SKIN: warm and dry without lesions    NEURO:  alert, approp, no deficits    cxr 11/28/12 Chronic lung disease. No superimposed acute findings are  identified.    Assessment:

## 2013-01-01 NOTE — Progress Notes (Signed)
Pt arrived at pulmonary rehab c/o productive cough with green sputum, low grade fever 99degrees last night, oxygen desaturation down to 74% on Sunday evening with increased dyspnea on exertion.  Pt notes these symptoms have been progressively increasing over past few days.  Pt did not exercise.  appt scheduled for pt to be seen today by Dr. Melvyn Novas at 1:30pm. Written and verbal instructions given. Pt and wife verbalized understanding

## 2013-01-03 ENCOUNTER — Encounter (HOSPITAL_COMMUNITY)
Admission: RE | Admit: 2013-01-03 | Discharge: 2013-01-03 | Disposition: A | Discharge status: Home / Self Care | Payer: Managed Care, Other (non HMO) | Source: Ambulatory Visit | Attending: Pulmonary Disease | Admitting: Pulmonary Disease

## 2013-01-08 ENCOUNTER — Encounter (HOSPITAL_COMMUNITY)
Admission: RE | Admit: 2013-01-08 | Discharge: 2013-01-08 | Disposition: A | Discharge status: Home / Self Care | Payer: Managed Care, Other (non HMO) | Source: Ambulatory Visit | Attending: Pulmonary Disease | Admitting: Pulmonary Disease

## 2013-01-08 DIAGNOSIS — I10 Essential (primary) hypertension: Secondary | ICD-10-CM | POA: Insufficient documentation

## 2013-01-08 DIAGNOSIS — J8410 Pulmonary fibrosis, unspecified: Secondary | ICD-10-CM | POA: Insufficient documentation

## 2013-01-08 DIAGNOSIS — I359 Nonrheumatic aortic valve disorder, unspecified: Secondary | ICD-10-CM | POA: Insufficient documentation

## 2013-01-08 DIAGNOSIS — G4733 Obstructive sleep apnea (adult) (pediatric): Secondary | ICD-10-CM | POA: Insufficient documentation

## 2013-01-08 DIAGNOSIS — Z5189 Encounter for other specified aftercare: Secondary | ICD-10-CM | POA: Insufficient documentation

## 2013-01-10 ENCOUNTER — Encounter (HOSPITAL_COMMUNITY)
Admission: RE | Admit: 2013-01-10 | Discharge: 2013-01-10 | Disposition: A | Discharge status: Home / Self Care | Payer: Managed Care, Other (non HMO) | Source: Ambulatory Visit | Attending: Pulmonary Disease | Admitting: Pulmonary Disease

## 2013-01-15 ENCOUNTER — Encounter (HOSPITAL_COMMUNITY)
Admission: RE | Admit: 2013-01-15 | Discharge: 2013-01-15 | Disposition: A | Discharge status: Home / Self Care | Payer: Managed Care, Other (non HMO) | Source: Ambulatory Visit | Attending: Pulmonary Disease | Admitting: Pulmonary Disease

## 2013-01-17 ENCOUNTER — Encounter (HOSPITAL_COMMUNITY): Payer: Managed Care, Other (non HMO)

## 2013-01-21 ENCOUNTER — Ambulatory Visit (INDEPENDENT_AMBULATORY_CARE_PROVIDER_SITE_OTHER)
Admission: RE | Admit: 2013-01-21 | Discharge: 2013-01-21 | Disposition: A | Discharge status: Home / Self Care | Payer: Managed Care, Other (non HMO) | Source: Ambulatory Visit | Attending: Adult Health | Admitting: Adult Health

## 2013-01-21 ENCOUNTER — Ambulatory Visit (INDEPENDENT_AMBULATORY_CARE_PROVIDER_SITE_OTHER): Payer: Managed Care, Other (non HMO) | Admitting: Adult Health

## 2013-01-21 ENCOUNTER — Encounter: Payer: Self-pay | Admitting: Adult Health

## 2013-01-21 VITALS — BP 122/72 | HR 81 | Temp 97.5°F | Ht 63.5 in | Wt 172.0 lb

## 2013-01-21 DIAGNOSIS — J84111 Idiopathic interstitial pneumonia, not otherwise specified: Secondary | ICD-10-CM

## 2013-01-21 MED ORDER — PREDNISONE 10 MG PO TABS: ORAL_TABLET | ORAL | Status: DC

## 2013-01-21 NOTE — Patient Instructions (Addendum)
Prednisone 10 mg 2 daily 1 week  then 1 daily for a week, then one half daily for 1 week and stop .  Chest xray today .  Wear Oxygen 2 l/m with walking  Please contact office for sooner follow up if symptoms do not improve or worsen or seek emergency care  follow up Dr. Gwenette Greet in 4 weeks and As needed

## 2013-01-22 ENCOUNTER — Encounter (HOSPITAL_COMMUNITY): Payer: Self-pay

## 2013-01-22 ENCOUNTER — Encounter (HOSPITAL_COMMUNITY)
Admission: RE | Admit: 2013-01-22 | Discharge: 2013-01-22 | Disposition: A | Discharge status: Home / Self Care | Payer: Managed Care, Other (non HMO) | Source: Ambulatory Visit | Attending: Pulmonary Disease | Admitting: Pulmonary Disease

## 2013-01-22 NOTE — Progress Notes (Signed)
Mr. [name] was educated today on weather conditions and how it affects breathing.  He was absent from class on Thursday.   Emphasis on understanding how his breathing is affected and modifiying his activity on those days.  We also discussed his oxygen usage when he is doing activity.  He still is not compliant with use for exertional activity at home.  We will concentrate on this issue and provide information for him regarding the physiological aspects of low oxygen saturations.  Leverne Humbles RN

## 2013-01-22 NOTE — Assessment & Plan Note (Addendum)
Slow to resolve flare with recent bronchitis Check chest xray today   Plan  Prednisone 10 mg 2 daily 1 week  then 1 daily for a week, then one half daily for 1 week and stop .  Chest xray today .  Wear Oxygen 2 l/m with walking  Please contact office for sooner follow up if symptoms do not improve or worsen or seek emergency care  follow up Dr. Gwenette Greet in 4 weeks and As needed

## 2013-01-22 NOTE — Progress Notes (Signed)
I have reviewed a Home Exercise Program with the patient.  [name]'s main mode of exercise will be walking.  We reviewed exercise guidelines, target heart rate during exercise, oxygen use, weather, home pulse oximeter, endpoints for exercise, and goals.  Patient is already walking 3 days a week for 45 minutes to an hour with wife.  Patient was educatde on how to progress walking intensity while self monitoring.  Patient has not been doing bands at home and was given a packet of different stretches and band exercises that he should be doing 2-3 days a week.  Patient has stated that they understand and are encouraged to come to me with any questions.  Will follow up with patient to progress exercise.

## 2013-01-22 NOTE — Progress Notes (Signed)
  Subjective:    Patient ID: [name], male    DOB: July 15, 1950, 62 y.o.   MRN: [id number]  HPI 62 yo male with known hx of idiopathic pulmonary fibrosis.    01/21/13 Follow up  Seen 2 weeks with acute bronchitis , tx w/ augmentin and steroid taper. He is feeling better but when he stopped the prednisone cough and dypsnea are slowly returning . Misunderstood the instructions and is only  Took prednisone for few days at 20mg  daily.  Did not wear Oxgyen in office , admits to wearing unless he feels dypsnea. Advised he does desat with walking and should wear Oxygen with walking.  No fever, discolored mucus, chest pain , orthopnea, edema.    Review of Systems Constitutional:   No  weight loss, night sweats,  Fevers, chills, fatigue, or  lassitude.  HEENT:   No headaches,  Difficulty swallowing,  Tooth/dental problems, or  Sore throat,                No sneezing, itching, ear ache, nasal congestion, post nasal drip,   CV:  No chest pain,  Orthopnea, PND, swelling in lower extremities, anasarca, dizziness, palpitations, syncope.   GI  No heartburn, indigestion, abdominal pain, nausea, vomiting, diarrhea, change in bowel habits, loss of appetite, bloody stools.   Resp:  .  No wheezing.  No chest wall deformity  Skin: no rash or lesions.  GU: no dysuria, change in color of urine, no urgency or frequency.  No flank pain, no hematuria   MS:  No joint pain or swelling.  No decreased range of motion.  No back pain.  Psych:  No change in mood or affect. No depression or anxiety.  No memory loss.         Objective:   Physical Exam  GEN: A/Ox3; pleasant , NAD, elderly  HEENT:  [place]/AT,  EACs-clear, TMs-wnl, NOSE-clear, THROAT-clear, no lesions, no postnasal drip or exudate noted.   NECK:  Supple w/ fair ROM; no JVD; normal carotid impulses w/o bruits; no thyromegaly or nodules palpated; no lymphadenopathy.  RESP  Bibasilar crackles no accessory muscle use, no dullness to  percussion  CARD:  RRR, no m/r/g  ,tr  peripheral edema, pulses intact, no cyanosis or clubbing.  GI:   Soft & nt; nml bowel sounds; no organomegaly or masses detected.  Musco: Warm bil, no deformities or joint swelling noted.   Neuro: alert, no focal deficits noted.    Skin: Warm, no lesions or rashes        Assessment & Plan:

## 2013-01-22 NOTE — Progress Notes (Signed)
Ov reviewed, and agree with plan as outlined.  

## 2013-01-24 ENCOUNTER — Encounter (HOSPITAL_COMMUNITY)
Admission: RE | Admit: 2013-01-24 | Discharge: 2013-01-24 | Disposition: A | Discharge status: Home / Self Care | Payer: Managed Care, Other (non HMO) | Source: Ambulatory Visit | Attending: Pulmonary Disease | Admitting: Pulmonary Disease

## 2013-01-29 ENCOUNTER — Encounter (HOSPITAL_COMMUNITY)
Admission: RE | Admit: 2013-01-29 | Discharge: 2013-01-29 | Disposition: A | Discharge status: Home / Self Care | Payer: Managed Care, Other (non HMO) | Source: Ambulatory Visit | Attending: Pulmonary Disease | Admitting: Pulmonary Disease

## 2013-01-31 ENCOUNTER — Encounter (HOSPITAL_COMMUNITY): Payer: Managed Care, Other (non HMO)

## 2013-02-05 ENCOUNTER — Encounter (HOSPITAL_COMMUNITY)
Admission: RE | Admit: 2013-02-05 | Discharge: 2013-02-05 | Disposition: A | Discharge status: Home / Self Care | Payer: Managed Care, Other (non HMO) | Source: Ambulatory Visit | Attending: Pulmonary Disease | Admitting: Pulmonary Disease

## 2013-02-05 DIAGNOSIS — G4733 Obstructive sleep apnea (adult) (pediatric): Secondary | ICD-10-CM | POA: Insufficient documentation

## 2013-02-05 DIAGNOSIS — J8410 Pulmonary fibrosis, unspecified: Secondary | ICD-10-CM | POA: Insufficient documentation

## 2013-02-05 DIAGNOSIS — I359 Nonrheumatic aortic valve disorder, unspecified: Secondary | ICD-10-CM | POA: Insufficient documentation

## 2013-02-05 DIAGNOSIS — Z5189 Encounter for other specified aftercare: Secondary | ICD-10-CM | POA: Insufficient documentation

## 2013-02-05 DIAGNOSIS — I10 Essential (primary) hypertension: Secondary | ICD-10-CM | POA: Insufficient documentation

## 2013-02-05 NOTE — Progress Notes (Signed)
Patient's weight is up 2.8 kg, does exhibit mild leg edema, lungs with slight crackles in right base, otherwise clear.  Did not exhibit shortness of breath until walking on track.  Instructed to weigh daily at home and record.  If weight increases any more to contact his physician.  Stated he ate [place] food  2 days ago, but nothing else salty.

## 2013-02-07 ENCOUNTER — Encounter (HOSPITAL_COMMUNITY)
Admission: RE | Admit: 2013-02-07 | Discharge: 2013-02-07 | Disposition: A | Discharge status: Home / Self Care | Payer: Managed Care, Other (non HMO) | Source: Ambulatory Visit | Attending: Pulmonary Disease | Admitting: Pulmonary Disease

## 2013-02-12 ENCOUNTER — Encounter (HOSPITAL_COMMUNITY)
Admission: RE | Admit: 2013-02-12 | Discharge: 2013-02-12 | Disposition: A | Discharge status: Home / Self Care | Payer: Managed Care, Other (non HMO) | Source: Ambulatory Visit | Attending: Pulmonary Disease | Admitting: Pulmonary Disease

## 2013-02-14 ENCOUNTER — Encounter (HOSPITAL_COMMUNITY)
Admission: RE | Admit: 2013-02-14 | Discharge: 2013-02-14 | Disposition: A | Discharge status: Home / Self Care | Payer: Managed Care, Other (non HMO) | Source: Ambulatory Visit | Attending: Pulmonary Disease | Admitting: Pulmonary Disease

## 2013-02-19 ENCOUNTER — Encounter (HOSPITAL_COMMUNITY)
Admission: RE | Admit: 2013-02-19 | Discharge: 2013-02-19 | Disposition: A | Discharge status: Home / Self Care | Payer: Managed Care, Other (non HMO) | Source: Ambulatory Visit | Attending: Pulmonary Disease | Admitting: Pulmonary Disease

## 2013-02-20 ENCOUNTER — Encounter: Payer: Self-pay | Admitting: Pulmonary Disease

## 2013-02-20 ENCOUNTER — Ambulatory Visit (INDEPENDENT_AMBULATORY_CARE_PROVIDER_SITE_OTHER): Payer: Managed Care, Other (non HMO) | Admitting: Pulmonary Disease

## 2013-02-20 VITALS — BP 108/72 | HR 88 | Temp 97.0°F | Ht 63.5 in | Wt 174.0 lb

## 2013-02-20 DIAGNOSIS — J84111 Idiopathic interstitial pneumonia, not otherwise specified: Secondary | ICD-10-CM

## 2013-02-20 NOTE — Progress Notes (Signed)
  Subjective:    Patient ID: [name], male    DOB: January 19, 1951, 62 y.o.   MRN: [id number]  HPI The patient comes in today for followup of his known idiopathic pulmonary fibrosis.  I last saw him in June after a hospitalization for possible pneumonia and flare.  He was supposed to have followup PFTs to try and get him enrolled in the pirfenidone study, but he began to have increasing shortness of breath and was seen on 2 occasions for acute visits in the office by my partners.  He was felt to have possible acute bronchitis, and was also treated with a round of prednisone for a possible flare.  The patient states the antibiotic helped his cough and purulent mucus, but he does not think prednisone made a difference to his breathing.  Currently, he feels that his exertional tolerance is not near his usual baseline from the spring.  He did have his pulmonary function studies in July, but they were never sent to [place] either by mail or computer.   Review of Systems  Constitutional: Negative for fever and unexpected weight change.  HENT: Negative for ear pain, nosebleeds, congestion, sore throat, rhinorrhea, sneezing, trouble swallowing, dental problem, postnasal drip and sinus pressure.   Eyes: Negative for redness and itching.  Respiratory: Positive for shortness of breath. Negative for cough, chest tightness and wheezing.   Cardiovascular: Negative for palpitations and leg swelling.  Gastrointestinal: Negative for nausea and vomiting.  Genitourinary: Negative for dysuria.  Musculoskeletal: Negative for joint swelling.  Skin: Negative for rash.  Neurological: Negative for headaches.  Hematological: Does not bruise/bleed easily.  Psychiatric/Behavioral: Negative for dysphoric mood. The patient is not nervous/anxious.        Objective:   Physical Exam Overweight male in no acute distress Nose without purulence or discharge noted Neck without lymphadenopathy or thyromegaly Chest with  crackles 1/3-1/2 up the chest bilaterally, no wheezing Cardiac exam with regular rate and rhythm Lower extremities with mild edema, no cyanosis Alert and oriented, moves all 4 extremities.       Assessment & Plan:

## 2013-02-20 NOTE — Assessment & Plan Note (Signed)
The patient has had some worsening shortness of breath this summer, and continues to be an issue currently.  It primarily manifest itself with heavier exertional activities.  His chest x-ray has not shown any worsening of his fibrosis, and his PFTs from July actually show a slight increase in his total lung capacity.  More than likely this is not specifically from his underlying lung disease.  Some of this may be related to the high heat and humidity this summer, but I also think we need to keep in mind volume overload.  He has had an echocardiogram that shows moderate elevation of his pulmonary pressures, but it is unclear how much this is related to diastolic heart failure versus pulmonary hypertension from his fibrosis.  If he continues to have shortness of breath issues, would consider a right heart catheterization to better define his physiology.  Unfortunately he does not qualify for the pirfenidone to study given his decreased DLCO, but I would certainly treat him with the medication once it arrives on the open market.

## 2013-02-20 NOTE — Patient Instructions (Addendum)
Please contact your primary about adjusting your fluid medication. Continue to work in pulmonary rehab. Keep your apptm in November, but call if you think there is a change in your breathing.

## 2013-02-21 ENCOUNTER — Encounter (HOSPITAL_COMMUNITY)
Admission: RE | Admit: 2013-02-21 | Discharge: 2013-02-21 | Disposition: A | Discharge status: Home / Self Care | Payer: Managed Care, Other (non HMO) | Source: Ambulatory Visit | Attending: Pulmonary Disease | Admitting: Pulmonary Disease

## 2013-02-26 ENCOUNTER — Encounter (HOSPITAL_COMMUNITY)
Admission: RE | Admit: 2013-02-26 | Discharge: 2013-02-26 | Disposition: A | Discharge status: Home / Self Care | Payer: Managed Care, Other (non HMO) | Source: Ambulatory Visit | Attending: Pulmonary Disease | Admitting: Pulmonary Disease

## 2013-02-28 ENCOUNTER — Encounter: Payer: Self-pay | Admitting: Pulmonary Disease

## 2013-02-28 ENCOUNTER — Encounter (HOSPITAL_COMMUNITY): Admission: RE | Admit: 2013-02-28 | Payer: Managed Care, Other (non HMO) | Source: Ambulatory Visit

## 2013-03-01 MED ORDER — DOXYCYCLINE HYCLATE 100 MG PO TABS: ORAL_TABLET | ORAL | Status: DC

## 2013-03-01 NOTE — Telephone Encounter (Addendum)
Spoke with patient-- Pt c/o increased cough with yellow mucus, increased SOB, fatigue, body aches and "spotty fever".  Pt requesting something be called into the pharmacy for him or if he needs an appt.   Aware that [facility] out of office and that another physician will be addressing this message. Allergies  Allergen Reactions  . Levofloxacin Other (See Comments)  [facility]  [address]  Dr Annamaria Boots please advise thanks.

## 2013-03-01 NOTE — Telephone Encounter (Signed)
Per CY-give Doxycycline 100 mg #8 take 2 today then 1 daily until gone no refills.

## 2013-03-01 NOTE — Telephone Encounter (Signed)
I spoke with pt and is aware of recs. Nothing further needed 

## 2013-03-02 ENCOUNTER — Encounter: Payer: Self-pay | Admitting: Pulmonary Disease

## 2013-03-03 ENCOUNTER — Inpatient Hospital Stay (HOSPITAL_COMMUNITY)
Admission: EM | Admit: 2013-03-03 | Discharge: 2013-03-05 | DRG: 196 | Disposition: A | Discharge status: Home / Self Care | Payer: Managed Care, Other (non HMO) | Attending: Internal Medicine | Admitting: Internal Medicine

## 2013-03-03 ENCOUNTER — Encounter (HOSPITAL_COMMUNITY): Payer: Self-pay | Admitting: Emergency Medicine

## 2013-03-03 ENCOUNTER — Emergency Department (HOSPITAL_COMMUNITY): Payer: Managed Care, Other (non HMO)

## 2013-03-03 ENCOUNTER — Telehealth: Payer: Self-pay | Admitting: Pulmonary Disease

## 2013-03-03 DIAGNOSIS — J9610 Chronic respiratory failure, unspecified whether with hypoxia or hypercapnia: Secondary | ICD-10-CM | POA: Diagnosis present

## 2013-03-03 DIAGNOSIS — G4733 Obstructive sleep apnea (adult) (pediatric): Secondary | ICD-10-CM | POA: Diagnosis present

## 2013-03-03 DIAGNOSIS — Z79899 Other long term (current) drug therapy: Secondary | ICD-10-CM

## 2013-03-03 DIAGNOSIS — J84112 Idiopathic pulmonary fibrosis: Principal | ICD-10-CM | POA: Diagnosis present

## 2013-03-03 DIAGNOSIS — J329 Chronic sinusitis, unspecified: Secondary | ICD-10-CM | POA: Diagnosis present

## 2013-03-03 DIAGNOSIS — E785 Hyperlipidemia, unspecified: Secondary | ICD-10-CM | POA: Diagnosis present

## 2013-03-03 DIAGNOSIS — I519 Heart disease, unspecified: Secondary | ICD-10-CM | POA: Diagnosis present

## 2013-03-03 DIAGNOSIS — I359 Nonrheumatic aortic valve disorder, unspecified: Secondary | ICD-10-CM | POA: Diagnosis present

## 2013-03-03 DIAGNOSIS — K219 Gastro-esophageal reflux disease without esophagitis: Secondary | ICD-10-CM | POA: Diagnosis present

## 2013-03-03 DIAGNOSIS — Z23 Encounter for immunization: Secondary | ICD-10-CM

## 2013-03-03 DIAGNOSIS — J8410 Pulmonary fibrosis, unspecified: Secondary | ICD-10-CM | POA: Diagnosis present

## 2013-03-03 DIAGNOSIS — Z9981 Dependence on supplemental oxygen: Secondary | ICD-10-CM

## 2013-03-03 DIAGNOSIS — Z87891 Personal history of nicotine dependence: Secondary | ICD-10-CM

## 2013-03-03 DIAGNOSIS — R0602 Shortness of breath: Secondary | ICD-10-CM | POA: Diagnosis present

## 2013-03-03 DIAGNOSIS — E119 Type 2 diabetes mellitus without complications: Secondary | ICD-10-CM

## 2013-03-03 DIAGNOSIS — E118 Type 2 diabetes mellitus with unspecified complications: Secondary | ICD-10-CM | POA: Diagnosis present

## 2013-03-03 DIAGNOSIS — J45909 Unspecified asthma, uncomplicated: Secondary | ICD-10-CM | POA: Diagnosis present

## 2013-03-03 DIAGNOSIS — J9620 Acute and chronic respiratory failure, unspecified whether with hypoxia or hypercapnia: Secondary | ICD-10-CM | POA: Diagnosis present

## 2013-03-03 DIAGNOSIS — I10 Essential (primary) hypertension: Secondary | ICD-10-CM | POA: Diagnosis present

## 2013-03-03 DIAGNOSIS — R0902 Hypoxemia: Secondary | ICD-10-CM

## 2013-03-03 DIAGNOSIS — I2789 Other specified pulmonary heart diseases: Secondary | ICD-10-CM | POA: Diagnosis present

## 2013-03-03 LAB — COMPREHENSIVE METABOLIC PANEL
ALT: 13 U/L (ref 0–53)
AST: 46 U/L — ABNORMAL HIGH (ref 0–37)
Calcium: 8.8 mg/dL (ref 8.4–10.5)
Creatinine, Ser: 1.08 mg/dL (ref 0.50–1.35)
GFR calc non Af Amer: 71 mL/min — ABNORMAL LOW (ref >90)
Sodium: 134 mEq/L — ABNORMAL LOW (ref 135–145)
Total Protein: 7.5 g/dL (ref 6.0–8.3)

## 2013-03-03 LAB — CBC WITH DIFFERENTIAL/PLATELET
Basophils Absolute: 0.1 10*3/uL (ref 0.0–0.1)
Basophils Relative: 1 % (ref 0–1)
Eosinophils Absolute: 0.5 10*3/uL (ref 0.0–0.7)
Eosinophils Relative: 5 % (ref 0–5)
HCT: 45.5 % (ref 39.0–52.0)
MCH: 29.6 pg (ref 26.0–34.0)
MCHC: 35.2 g/dL (ref 30.0–36.0)
MCV: 84.3 fL (ref 78.0–100.0)
Monocytes Absolute: 1.1 10*3/uL — ABNORMAL HIGH (ref 0.1–1.0)
Platelets: 341 10*3/uL (ref 150–400)
RDW: 15.5 % (ref 11.5–15.5)
WBC: 10.2 10*3/uL (ref 4.0–10.5)

## 2013-03-03 MED ORDER — DIPHENHYDRAMINE HCL 25 MG PO TABS: 25.0000 mg | ORAL_TABLET | Freq: Four times a day (QID) | ORAL | Status: DC

## 2013-03-03 MED ORDER — VITAMIN D 1000 UNITS PO TABS
1000.0000 [IU] | ORAL_TABLET | Freq: Every day | ORAL | Status: DC
  Administered 2013-03-04: 1000 [IU] via ORAL
  Filled 2013-03-03 (×2): qty 1

## 2013-03-03 MED ORDER — DEXTROSE 5 % IV SOLN
500.0000 mg | INTRAVENOUS | Status: DC
  Administered 2013-03-04 – 2013-03-05 (×2): 500 mg via INTRAVENOUS
  Filled 2013-03-03 (×2): qty 500

## 2013-03-03 MED ORDER — PANTOPRAZOLE SODIUM 40 MG PO TBEC
40.0000 mg | DELAYED_RELEASE_TABLET | Freq: Every day | ORAL | Status: DC
  Administered 2013-03-04: 40 mg via ORAL
  Filled 2013-03-03 (×2): qty 1

## 2013-03-03 MED ORDER — ALBUTEROL SULFATE (5 MG/ML) 0.5% IN NEBU: 5.0000 mg | INHALATION_SOLUTION | Freq: Four times a day (QID) | RESPIRATORY_TRACT | Status: DC | PRN

## 2013-03-03 MED ORDER — DIPHENHYDRAMINE HCL 25 MG PO CAPS
25.0000 mg | ORAL_CAPSULE | Freq: Four times a day (QID) | ORAL | Status: DC
  Administered 2013-03-04 – 2013-03-05 (×6): 25 mg via ORAL
  Filled 2013-03-03 (×9): qty 1

## 2013-03-03 MED ORDER — DEXTROSE 5 % IV SOLN
1.0000 g | INTRAVENOUS | Status: DC
  Administered 2013-03-04 (×2): 1 g via INTRAVENOUS
  Filled 2013-03-03 (×2): qty 10

## 2013-03-03 MED ORDER — METHYLPREDNISOLONE SODIUM SUCC 125 MG IJ SOLR
80.0000 mg | Freq: Three times a day (TID) | INTRAMUSCULAR | Status: DC
  Administered 2013-03-04 – 2013-03-05 (×4): 80 mg via INTRAVENOUS
  Filled 2013-03-03 (×7): qty 1.28

## 2013-03-03 MED ORDER — HYDROCHLOROTHIAZIDE 12.5 MG PO CAPS
12.5000 mg | ORAL_CAPSULE | Freq: Every day | ORAL | Status: DC
  Filled 2013-03-03 (×2): qty 1

## 2013-03-03 MED ORDER — METHYLPREDNISOLONE SODIUM SUCC 125 MG IJ SOLR
250.0000 mg | Freq: Once | INTRAMUSCULAR | Status: AC
  Administered 2013-03-03: 125 mg via INTRAVENOUS
  Filled 2013-03-03: qty 4

## 2013-03-03 MED ORDER — MULTIVITAMINS PO CAPS: 1.0000 | ORAL_CAPSULE | Freq: Every day | ORAL | Status: DC

## 2013-03-03 MED ORDER — HEPARIN SODIUM (PORCINE) 5000 UNIT/ML IJ SOLN
5000.0000 [IU] | Freq: Three times a day (TID) | INTRAMUSCULAR | Status: DC
  Administered 2013-03-04 – 2013-03-05 (×5): 5000 [IU] via SUBCUTANEOUS
  Filled 2013-03-03 (×8): qty 1

## 2013-03-03 MED ORDER — ASPIRIN 81 MG PO CHEW
81.0000 mg | CHEWABLE_TABLET | Freq: Every day | ORAL | Status: DC
  Administered 2013-03-04: 81 mg via ORAL
  Filled 2013-03-03 (×2): qty 1

## 2013-03-03 MED ORDER — INSULIN ASPART 100 UNIT/ML ~~LOC~~ SOLN
0.0000 [IU] | Freq: Three times a day (TID) | SUBCUTANEOUS | Status: DC
  Administered 2013-03-04 (×2): 7 [IU] via SUBCUTANEOUS
  Administered 2013-03-04 – 2013-03-05 (×2): 11 [IU] via SUBCUTANEOUS

## 2013-03-03 MED ORDER — GLIPIZIDE ER 5 MG PO TB24
5.0000 mg | ORAL_TABLET | Freq: Every day | ORAL | Status: DC
  Administered 2013-03-04 – 2013-03-05 (×2): 5 mg via ORAL
  Filled 2013-03-03 (×3): qty 1

## 2013-03-03 MED ORDER — ATORVASTATIN CALCIUM 20 MG PO TABS
20.0000 mg | ORAL_TABLET | Freq: Every day | ORAL | Status: DC
  Administered 2013-03-04: 20 mg via ORAL
  Filled 2013-03-03 (×2): qty 1

## 2013-03-03 MED ORDER — AMLODIPINE-VALSARTAN-HCTZ 5-160-12.5 MG PO TABS: 1.0000 | ORAL_TABLET | Freq: Every day | ORAL | Status: DC

## 2013-03-03 MED ORDER — ADULT MULTIVITAMIN W/MINERALS CH
1.0000 | ORAL_TABLET | Freq: Every day | ORAL | Status: DC
  Administered 2013-03-04 – 2013-03-05 (×2): 1 via ORAL
  Filled 2013-03-03 (×3): qty 1

## 2013-03-03 MED ORDER — DEXAMETHASONE SODIUM PHOSPHATE 10 MG/ML IJ SOLN: 5.0000 mg | Freq: Once | INTRAMUSCULAR | Status: DC

## 2013-03-03 MED ORDER — ALBUTEROL SULFATE (5 MG/ML) 0.5% IN NEBU
5.0000 mg | INHALATION_SOLUTION | Freq: Once | RESPIRATORY_TRACT | Status: AC
  Administered 2013-03-03: 5 mg via RESPIRATORY_TRACT
  Filled 2013-03-03: qty 1

## 2013-03-03 MED ORDER — DM-GUAIFENESIN ER 30-600 MG PO TB12
1.0000 | ORAL_TABLET | Freq: Two times a day (BID) | ORAL | Status: DC
  Administered 2013-03-04: 1 via ORAL
  Filled 2013-03-03 (×3): qty 1

## 2013-03-03 MED ORDER — IRBESARTAN 150 MG PO TABS
150.0000 mg | ORAL_TABLET | Freq: Every day | ORAL | Status: DC
  Administered 2013-03-04: 150 mg via ORAL
  Filled 2013-03-03 (×2): qty 1

## 2013-03-03 MED ORDER — INSULIN ASPART 100 UNIT/ML ~~LOC~~ SOLN
0.0000 [IU] | Freq: Every day | SUBCUTANEOUS | Status: DC
  Administered 2013-03-04: 3 [IU] via SUBCUTANEOUS

## 2013-03-03 MED ORDER — IPRATROPIUM BROMIDE 0.02 % IN SOLN
0.5000 mg | Freq: Once | RESPIRATORY_TRACT | Status: AC
  Administered 2013-03-03: 0.5 mg via RESPIRATORY_TRACT
  Filled 2013-03-03: qty 2.5

## 2013-03-03 MED ORDER — AMLODIPINE BESYLATE 5 MG PO TABS
5.0000 mg | ORAL_TABLET | Freq: Every day | ORAL | Status: DC
  Administered 2013-03-04: 5 mg via ORAL
  Filled 2013-03-03 (×2): qty 1

## 2013-03-03 NOTE — H&P (Signed)
[facility] History and Physical  [name] [id number] DOB: 1950-07-24 DOA: 03/03/2013  Referring physician: Dr. Audie Pinto PCP: Wenda Low, MD  Specialists: Dr.Clance (pulmonologist)  Chief Complaint: Increase shortness of breath and hypoxia  HPI: [name] is a 62 y.o. male with past medical history significant for hypertension, hyperlipidemia, diabetes mellitus without complications (treatment with the use of glipizide), GERD, interstitial lung disease (with the use of chronic oxygen at home, 2 L at baseline) and history of osteoarthritis; came to the hospital secondary to worsening shortness of breath and cough. Patient reports that for the last 3 days or so he has been having worsening shortness of breath and associated productive cough. On 03/01/2013 patient call his pulmonologist with ongoing complaints and received prescription for doxycycline; patient reports that despite the use of antibiotics he continue to have worsening of his shortness of breath due to point oxygen saturation at home drop into the 70s, at that moment patient came to the major department for further evaluation and treatment. In the ED patient received high-dose Solu-Medrol, nebulizer treatment given his shortness of breath and nonrebreathing mask was applied; patient symptoms improved on [facility] has been called to admit the patient for further evaluation and treatment. Patient denies fever/chills, chest pain, abdominal pain, nausea/vomiting, dysuria or any other acute complaints.  Review of Systems:  Negative except as otherwise mentioned on history of present illness.  Past Medical History  Diagnosis Date  . Benign neoplasm of colon   . Aortic valve disorders   . Osteoarthrosis, unspecified whether generalized or localized, unspecified site   . Degeneration of intervertebral disc, site unspecified   . Other and unspecified hyperlipidemia   . Unspecified essential hypertension    . Obstructive sleep apnea (adult) (pediatric)   . Esophageal reflux   . 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 Pneumonia     interstitial pneumonia  . Diabetes mellitus without complication    Past Surgical History  Procedure Laterality Date  . Lung biopsy  2010   Social History:  reports that he quit smoking about 6 years ago. His smoking use included Cigarettes. He has a 6 pack-year smoking history. He has never used smokeless tobacco. He reports that he does not drink alcohol or use illicit drugs.  Allergies  Allergen Reactions  . Levofloxacin Other (See Comments)    Family History  Problem Relation Age of Onset  . Pancreatic cancer Brother   . Heart disease Father    Prior to Admission medications   Medication Sig Start Date End Date Taking? Authorizing Provider  aspirin 81 MG tablet Take 81 mg by mouth daily.    Yes Historical Provider, MD  atorvastatin (LIPITOR) 20 MG tablet Take 20 mg by mouth daily. 1 by mouth daily 10/04/11  Yes Historical Provider, MD  chlorpheniramine (CHLOR-TRIMETON) 4 MG tablet Take 4 mg by mouth 2 (two) times daily as needed.   Yes Historical Provider, MD  Cholecalciferol (VITAMIN D) 1000 UNITS capsule Take 1,000 Units by mouth daily.    Yes Historical Provider, MD  dextromethorphan-guaiFENesin (MUCINEX DM) 30-600 MG per 12 hr tablet Take 1 tablet by mouth daily as needed (for lung clearance).   Yes Historical Provider, MD  doxycycline (VIBRA-TABS) 100 MG tablet Take 2 today then 1 daily until gone 03/01/13  Yes Clinton D Young, MD  EXFORGE HCT 5-160-12.5 MG TABS Take 1 tablet by mouth daily. 01/04/13  Yes Historical Provider, MD  GLIPIZIDE XL 5 MG 24 hr  tablet Take 5 mg by mouth daily. 1 by mouth daily 09/19/11  Yes Historical Provider, MD  Multiple Vitamin (MULTIVITAMIN) capsule Take 1 capsule by mouth daily.    Yes Historical Provider, MD  omeprazole (PRILOSEC) 40 MG capsule Take 40 mg by mouth daily.    Yes Historical  Provider, MD  diphenhydrAMINE (BENADRYL) 25 MG tablet Take 1 tablet (25 mg total) by mouth every 6 (six) hours. 03/03/13   Dot Lanes, MD   Physical Exam: Filed Vitals:   03/03/13 2031  BP: 141/98  Pulse: 97  Temp: 97.5 F (36.4 C)  Resp: 30     General:  Afebrile, alert, awake and oriented x3, Tachypneic and unable to speak in full sentences secondary to shortness of breath.  Eyes: PERRLA, extraocular muscles intact, no icterus, no nystagmus  ENT: Good dentition, no erythema or exudates inside his mouth, no drainage out of his ears or nostrils  Neck: Supple, no thyromegaly, no bruits  Cardiovascular: S1 and S2, no rubs, no gallops, no murmurs  Respiratory: Good air movement, no wheezing and positive diffuse rhonchi  Abdomen: soft, nontender, nondistended, positive bowel sounds; no guarding  Skin: No rash, no petechiae  Musculoskeletal: no edema, no cyanosis or clubbing, full range of motion, no swelling or erythema of his joints.  Psychiatric: stable mood. No suicidal ideation, no hallucinations.  Neurologic: alert, awake and oriented x3, CN grossly intact, no focal motor or sensory deficit; muscle strength 5 out of 5 bilaterally and symmetrically.  Labs on Admission:  Basic Metabolic Panel:  Recent Labs Lab 03/03/13 2026  NA 134*  K 4.5  CL 99  CO2 19  GLUCOSE 203*  BUN 12  CREATININE 1.08  CALCIUM 8.8   Liver Function Tests:  Recent Labs Lab 03/03/13 2026  AST 46*  ALT 13  ALKPHOS 99  BILITOT 0.5  PROT 7.5  ALBUMIN 3.0*   CBC:  Recent Labs Lab 03/03/13 2026  WBC 10.2  NEUTROABS 6.1  HGB 16.0  HCT 45.5  MCV 84.3  PLT 341   BNP (last 3 results)  Recent Labs  03/03/13 2026  PROBNP 243.8*   Radiological Exams on Admission: Dg Chest 2 View  03/03/2013   CLINICAL DATA:  Increase shortness of breath. Recent diagnosis of pneumonia.  EXAM: CHEST  2 VIEW  COMPARISON:  01/21/2013  FINDINGS: Extensive interstitial fibrosis is stable. No  convincing acute infiltrate. No pleural effusion or pneumothorax.  The cardiac silhouette is mildly enlarged. The mediastinum is normal in contour.  The bony thorax is demineralized.  IMPRESSION: No convincing acute cardiopulmonary disease.  Extensive interstitial fibrosis, stable from the prior study.   Electronically Signed   By: Lajean Manes   On: 03/03/2013 21:12    Assessment/Plan 1-Acute-on-chronic respiratory failure with hypoxia: Secondary to exacerbation of his underlying interstitial lung disease and most likely community acquired pneumonia. -Will admit to step down -Start treatment with Zithromax and Rocephin -IV Solu Medrol -As needed nebulizer treatment -Mucinex twice a day and also the use of flutter valve -continue oxygen supplementation (now at 4 L) -pulmonologist has been consulted and will follow any further recommendations.  2-ACID REFLUX DISEASE: Continue PPI  3-Diabetes mellitus: Will check hemoglobin A1c. Will hold glipizide while the patient is in the hospital and use sliding scale insulin.  4-hypertension: Will continue amlodipine and by that time. Blood pressure stable  5-hyperlipidemia: Continue statins.  DVT: Lovenox   Pulmonologist (Dr. Oliver Pila)  Code Status: Full code Family Communication: Wife at bedside  Disposition Plan: Inpatient, step down, LOS > 2 midnights  Time spent: 50 minutes  Deidre Carino [facility] Pager [phone]  If 7PM-7AM, please contact night-coverage [email or web address] Password [facility] 03/03/2013, 10:53 PM

## 2013-03-03 NOTE — ED Notes (Signed)
Pt was recently dx with pneumonia. Last night he began feeling short of breath , which has gotten progressively worse.

## 2013-03-03 NOTE — Telephone Encounter (Signed)
Patient's wife called as patient is too short of breath. Reports no improvement on Doxycycline, cough, progressive dyspnea. Last SpO2 = 79-80 on 2 liters.  Will be going to [facility] ED for evaluation.

## 2013-03-04 ENCOUNTER — Encounter: Payer: Self-pay | Admitting: Pulmonary Disease

## 2013-03-04 DIAGNOSIS — R0602 Shortness of breath: Secondary | ICD-10-CM | POA: Diagnosis present

## 2013-03-04 LAB — CBC
HCT: 44.1 % (ref 39.0–52.0)
Hemoglobin: 15.2 g/dL (ref 13.0–17.0)
MCH: 29.2 pg (ref 26.0–34.0)
MCHC: 34.5 g/dL (ref 30.0–36.0)
MCV: 84.6 fL (ref 78.0–100.0)
RBC: 5.21 MIL/uL (ref 4.22–5.81)

## 2013-03-04 LAB — EXPECTORATED SPUTUM ASSESSMENT W GRAM STAIN, RFLX TO RESP C

## 2013-03-04 LAB — BASIC METABOLIC PANEL
BUN: 13 mg/dL (ref 6–23)
CO2: 21 mEq/L (ref 19–32)
GFR calc Af Amer: 89 mL/min — ABNORMAL LOW (ref >90)
GFR calc non Af Amer: 77 mL/min — ABNORMAL LOW (ref >90)
Glucose, Bld: 310 mg/dL — ABNORMAL HIGH (ref 70–99)
Potassium: 3.8 mEq/L (ref 3.5–5.1)
Sodium: 133 mEq/L — ABNORMAL LOW (ref 135–145)

## 2013-03-04 LAB — GLUCOSE, CAPILLARY
Glucose-Capillary: 163 mg/dL — ABNORMAL HIGH (ref 70–99)
Glucose-Capillary: 259 mg/dL — ABNORMAL HIGH (ref 70–99)
Glucose-Capillary: 178 mg/dL — ABNORMAL HIGH (ref 70–99)

## 2013-03-04 LAB — HEMOGLOBIN A1C: Hgb A1c MFr Bld: 8.4 % — ABNORMAL HIGH (ref <5.7)

## 2013-03-04 LAB — LEGIONELLA ANTIGEN, URINE

## 2013-03-04 LAB — MRSA PCR SCREENING: MRSA by PCR: NEGATIVE

## 2013-03-04 MED ORDER — VITAMIN D3 25 MCG (1000 UNIT) PO TABS
1000.0000 [IU] | ORAL_TABLET | Freq: Every day | ORAL | Status: DC
  Administered 2013-03-05: 1000 [IU] via ORAL
  Filled 2013-03-04: qty 1

## 2013-03-04 MED ORDER — HYDROCHLOROTHIAZIDE 12.5 MG PO CAPS
12.5000 mg | ORAL_CAPSULE | Freq: Every day | ORAL | Status: DC
  Administered 2013-03-04: 12.5 mg via ORAL
  Filled 2013-03-04 (×2): qty 1

## 2013-03-04 MED ORDER — PANTOPRAZOLE SODIUM 40 MG PO TBEC
40.0000 mg | DELAYED_RELEASE_TABLET | Freq: Every day | ORAL | Status: DC
  Administered 2013-03-05: 40 mg via ORAL
  Filled 2013-03-04: qty 1

## 2013-03-04 MED ORDER — FLUTICASONE PROPIONATE 50 MCG/ACT NA SUSP
2.0000 | Freq: Every day | NASAL | Status: DC
  Administered 2013-03-04 – 2013-03-05 (×2): 2 via NASAL
  Filled 2013-03-04: qty 16

## 2013-03-04 MED ORDER — INFLUENZA VAC SPLIT QUAD 0.5 ML IM SUSP
0.5000 mL | INTRAMUSCULAR | Status: AC
  Administered 2013-03-05: 0.5 mL via INTRAMUSCULAR
  Filled 2013-03-04 (×2): qty 0.5

## 2013-03-04 MED ORDER — AMLODIPINE BESYLATE 5 MG PO TABS
5.0000 mg | ORAL_TABLET | Freq: Every day | ORAL | Status: DC
  Administered 2013-03-05: 5 mg via ORAL
  Filled 2013-03-04: qty 1

## 2013-03-04 MED ORDER — ASPIRIN 81 MG PO CHEW
81.0000 mg | CHEWABLE_TABLET | Freq: Every day | ORAL | Status: DC
  Administered 2013-03-05: 81 mg via ORAL
  Filled 2013-03-04: qty 1

## 2013-03-04 MED ORDER — ATORVASTATIN CALCIUM 20 MG PO TABS
20.0000 mg | ORAL_TABLET | Freq: Every day | ORAL | Status: DC
Start: 2013-03-05 — End: 2013-03-05
  Administered 2013-03-05: 20 mg via ORAL
  Filled 2013-03-04: qty 1

## 2013-03-04 MED ORDER — IRBESARTAN 150 MG PO TABS
150.0000 mg | ORAL_TABLET | Freq: Every day | ORAL | Status: DC
  Administered 2013-03-05: 150 mg via ORAL
  Filled 2013-03-04: qty 1

## 2013-03-04 MED ORDER — SALINE SPRAY 0.65 % NA SOLN
2.0000 | Freq: Four times a day (QID) | NASAL | Status: DC
  Administered 2013-03-04 (×2): 2 via NASAL
  Filled 2013-03-04: qty 44

## 2013-03-04 MED ORDER — DM-GUAIFENESIN ER 30-600 MG PO TB12
1.0000 | ORAL_TABLET | Freq: Two times a day (BID) | ORAL | Status: DC
  Administered 2013-03-04 – 2013-03-05 (×2): 1 via ORAL
  Filled 2013-03-04 (×3): qty 1

## 2013-03-04 NOTE — Progress Notes (Signed)
Inpatient Diabetes Program Recommendations  AACE/ADA: New Consensus Statement on Inpatient Glycemic Control (2013)  Target Ranges:  Prepandial:   less than 140 mg/dL      Peak postprandial:   less than 180 mg/dL (1-2 hours)      Critically ill patients:  140 - 180 mg/dL   Reason for Assessment: Sub-optimal glycemic control on SoluMedrol  Inpatient Diabetes Program Recommendations Correction (SSI): Continue Novolog correction with resistant scale tid with meals and HS scale at bedtime. Insulin - Meal Coverage: Request MD consider starting Novolog 4 to 6 units tid with meals while on SoluMedrol 80 mg tid-- and taper down as steroids are tapered.  (In addition to correction scale) Oral Agents: Currently taking Glucotrol 5 mg daily as he takes at home.  Request MD consider stopping Glucotrol while in hospital and use insulin to treat diabetes-- less risk of hypoglycemia HgbA1C: Last Hgb A1C was 7.04 November 2012.  Request MD repeat Hgb A1C. Diet: Current diet order is Heart Healthy.  Request MD add CHO modified restriction to diet order.  Patient eating 100%.  Note:  Results for [name], [name] (MRN [id number]) as of 03/04/2013 14:09  Ref. Range 03/04/2013 00:43 03/04/2013 08:22 03/04/2013 12:35  Glucose-Capillary Latest Range: 70-99 mg/dL 163 (H) 259 (H) 230 (H)   Thank you.  [name] S. Marcelline Mates, RN, CNS, CDE Inpatient Diabetes Program, team pager [phone]

## 2013-03-04 NOTE — Consult Note (Addendum)
PULMONARY  / CRITICAL CARE MEDICINE  Name: [name] MRN: [id number] DOB: 19-Jun-1950    ADMISSION DATE:  03/03/2013 CONSULTATION DATE:  9/29  REFERRING MD :  Lysle Rubens  PRIMARY SERVICE:  same  CHIEF COMPLAINT:  Dyspnea   BRIEF PATIENT DESCRIPTION:  62 year old AAM w/ known h/o IPF last seen in our office 9/17 (a couple d after stopping pred), essentially at baseline. On 9/22 was noted to have some increased oxygen requirements at pulm rehab.  Admitted on 9/28 w/ ~ 1 week h/o progressive nasal d/c, worsening cough and myalgias which did not respond to doxy. PCCM asked to see as pulm consult 9/29   SIGNIFICANT EVENTS / STUDIES:    LINES / TUBES:   CULTURES: mrsa screen 9/28: neg   ANTIBIOTICS: azithro 9/28>>> Rocephin 9/28>>>  HISTORY OF PRESENT ILLNESS:   62 year old male f/b [place] w/ known IPF. Last seen in our office 9/17 for follow up IPF flare. OF note was hospitalized in June, but has returned to the office with acute visits both in July and August after pred taper completed. At time of his visit on 9/17 with Clance he endorsed some activity limitations but felt to be more chronic change. At this point he had just come off prednisone approx 2d prior. On the following week 9/22 he was told in rehab that his sats were lower than usual (80% on 2 liters). He was instructed to watch this at home and notify our office if it worsened or persisted. On 9/23 he began to notice generalized body aches, some increase in nasal congestion and nasal discharge with his sputum changing color to yellow. His dyspnea became more symptomatic and he was unable to tolerate pulm rehab on 9/25, the following day 9/26 he called our office. At that point he had been titrating his oxygen up to 5 liters and his cough was worse. He was started on Doxy for concern about sinusitis. He initially felt better, but then later in the day on 27th he could not longer do any activity and he then reported to the ER  PAST  MEDICAL HISTORY :  Past Medical History  Diagnosis Date  . Benign neoplasm of colon   . Aortic valve disorders   . Osteoarthrosis, unspecified whether generalized or localized, unspecified site   . Degeneration of intervertebral disc, site unspecified   . Other and unspecified hyperlipidemia   . Unspecified essential hypertension   . Obstructive sleep apnea (adult) (pediatric)   . Esophageal reflux   . 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 Pneumonia     interstitial pneumonia  . Diabetes mellitus without complication    Past Surgical History  Procedure Laterality Date  . Lung biopsy  2010   Prior to Admission medications   Medication Sig Start Date End Date Taking? Authorizing Provider  aspirin 81 MG tablet Take 81 mg by mouth daily.    Yes Historical Provider, MD  atorvastatin (LIPITOR) 20 MG tablet Take 20 mg by mouth daily. 1 by mouth daily 10/04/11  Yes Historical Provider, MD  chlorpheniramine (CHLOR-TRIMETON) 4 MG tablet Take 4 mg by mouth 2 (two) times daily as needed.   Yes Historical Provider, MD  Cholecalciferol (VITAMIN D) 1000 UNITS capsule Take 1,000 Units by mouth daily.    Yes Historical Provider, MD  dextromethorphan-guaiFENesin (MUCINEX DM) 30-600 MG per 12 hr tablet Take 1 tablet by mouth daily as needed (for lung clearance).   Yes  Historical Provider, MD  doxycycline (VIBRA-TABS) 100 MG tablet Take 2 today then 1 daily until gone 03/01/13  Yes Clinton D Young, MD  EXFORGE HCT 5-160-12.5 MG TABS Take 1 tablet by mouth daily. 01/04/13  Yes Historical Provider, MD  GLIPIZIDE XL 5 MG 24 hr tablet Take 5 mg by mouth daily. 1 by mouth daily 09/19/11  Yes Historical Provider, MD  Multiple Vitamin (MULTIVITAMIN) capsule Take 1 capsule by mouth daily.    Yes Historical Provider, MD  omeprazole (PRILOSEC) 40 MG capsule Take 40 mg by mouth daily.    Yes Historical Provider, MD  diphenhydrAMINE (BENADRYL) 25 MG tablet Take 1 tablet (25 mg total) by  mouth every 6 (six) hours. 03/03/13   Dot Lanes, MD   Allergies  Allergen Reactions  . Levofloxacin Other (See Comments)    FAMILY HISTORY:  Family History  Problem Relation Age of Onset  . Pancreatic cancer Brother   . Heart disease Father    SOCIAL HISTORY:  reports that he quit smoking about 6 years ago. His smoking use included Cigarettes. He has a 6 pack-year smoking history. He has never used smokeless tobacco. He reports that he does not drink alcohol or use illicit drugs.  REVIEW OF SYSTEMS bolds will be positive :   Constitutional: Negative for fever, chills, weight loss, malaise/fatigue, first noted 9/25, and diaphoresis.  HENT: Negative for hearing loss, ear pain, nosebleeds, congestion, sore throat, increased nasal discharge and cough first noted 02/28/24, neck pain, tinnitus and ear discharge.   Eyes: Negative for blurred vision, double vision, photophobia, pain, discharge and redness. Runny eyes started 9/25 Respiratory: Negative for cough, chronic, but had increased significantly in association with nasal discharge, noted it was thicker in nature and turned yellow.  hemoptysis, shortness of breath, actually noted asymptomatic hypoxia during pulmonary rehab on 9/22, sats as low as 80% on two liters, started to note cough worse 23rd and 24th, unable to participate in pulm rehab 25th, sats stayed in 80s, in spite of titration of oxygen. On 28th finally decided to come in as even on 8 liters could not tolerate activity   wheezing and stridor.   Cardiovascular: Negative for chest pain, palpitations, orthopnea, claudication, leg swelling and PND.  Gastrointestinal: Negative for heartburn, nausea, vomiting, abdominal pain, diarrhea, constipation, blood in stool and melena.  Genitourinary: Negative for dysuria, urgency, frequency, hematuria and flank pain.  Musculoskeletal: Negative for myalgias, back pain, joint pain and falls.  Skin: Negative for itching and rash.   Neurological: Negative for dizziness, tingling, tremors, sensory change, speech change, focal weakness, seizures, loss of consciousness, weakness and headaches.  Endo/Heme/Allergies: Negative for environmental allergies and polydipsia. Does not bruise/bleed easily.  SUBJECTIVE:  Feels better  VITAL SIGNS: Temp:  [97.4 F (36.3 C)-98.3 F (36.8 C)] 97.8 F (36.6 C) (09/29 0400) Pulse Rate:  [78-97] 86 (09/29 0800) Resp:  [10-35] 21 (09/29 0800) BP: (106-157)/(69-98) 133/90 mmHg (09/29 0800) SpO2:  [77 %-100 %] 93 % (09/29 0800) FiO2 (%):  [100 %] 100 % (09/28 2031) Weight:  [76.9 kg (169 lb 8.5 oz)-77.111 kg (170 lb)] 76.9 kg (169 lb 8.5 oz) (09/29 0000)  PHYSICAL EXAMINATION: General:  Well appearing 62 year old male, no acute distress  Neuro:  Awake, alert, no focal def  HEENT:  NO JVD, nasal congestion noted, white/ creamy exudate noted on posterior pharynx  Cardiovascular:  rrr Lungs:  Dry velcro crackles posterior Abdomen:  Soft, non-tender  Musculoskeletal:  Intact  Skin:  Intact  Recent Labs Lab 03/03/13 2026 03/04/13 0338  NA 134* 133*  K 4.5 3.8  CL 99 99  CO2 19 21  BUN 12 13  CREATININE 1.08 1.01  GLUCOSE 203* 310*    Recent Labs Lab 03/03/13 2026 03/04/13 0338  HGB 16.0 15.2  HCT 45.5 44.1  WBC 10.2 9.5  PLT 341 302   Dg Chest 2 View  03/03/2013   CLINICAL DATA:  Increase shortness of breath. Recent diagnosis of pneumonia.  EXAM: CHEST  2 VIEW  COMPARISON:  01/21/2013  FINDINGS: Extensive interstitial fibrosis is stable. No convincing acute infiltrate. No pleural effusion or pneumothorax.  The cardiac silhouette is mildly enlarged. The mediastinum is normal in contour.  The bony thorax is demineralized.  IMPRESSION: No convincing acute cardiopulmonary disease.  Extensive interstitial fibrosis, stable from the prior study.   Electronically Signed   By: Lajean Manes   On: 03/03/2013 21:12  agree don't see much change   ASSESSMENT / PLAN:  Acute on  Chronic resp failure: think that this is likely Flare of his IPF, +/- sinusitis/ acute bronchitis.  This is now his third event since June where he has had acute dyspnea re-occur not long after completing a pred taper. Interestingly his auto-immune work ups have been negative to date. Spoke w/ Dr Gwenette Greet re: his case. He also voiced concern about progressive IPF and possibly diastolic dysfxn playing a role.  DLCO quite low  - 28% , not a candidate for perfenidone PFTs do suggest some airway obstruction Recommendation -continue slow pred taper -complete abx course for sinusitis -add nasal hygiene regimen -get echo: need to re-screen pulm pressures and eval diastolic fxn -consider diuresis -we will consider f/u repeat auto-immune eval of steroids in future  -will need walking oximetry prior to d/c -he Does NOT need bronchodilators  -will set him up for f/u our office Monday Oct 6 at 1115 w/ Tammy & Tues Nov 4 at 10am w/ [facility] during the described time interval was provided by me and/or other providers on the critical care team.  I have reviewed this patient's available data, including medical history, events of note, physical examination and test results as part of my evaluation   [facility] Pager: [phone]   03/04/2013, 9:02 AM

## 2013-03-04 NOTE — Progress Notes (Signed)
Nutrition Brief Note  Patient identified on the Malnutrition Screening Tool (MST) Report. Pt denies poor appetite. Weight has appeared stable per chart review. Pt has intentionally tried to lose weight, per chart review; has been seen by Pulmonary Rehab RD this past summer.  Wt Readings from Last 15 Encounters:  03/04/13 169 lb 8.5 oz (76.9 kg)  02/20/13 174 lb (78.926 kg)  01/21/13 172 lb (78.019 kg)  01/01/13 173 lb 6.4 oz (78.654 kg)  11/28/12 174 lb 9.6 oz (79.198 kg)  11/13/12 168 lb 9.6 oz (76.476 kg)  10/04/12 177 lb (80.287 kg)  05/04/12 172 lb (78.019 kg)  11/02/11 179 lb (81.194 kg)  05/04/11 181 lb (82.101 kg)  11/10/10 176 lb 12.8 oz (80.196 kg)  11/02/10 175 lb 6.4 oz (79.561 kg)  10/26/09 171 lb (77.565 kg)  04/28/09 178 lb 4 oz (80.854 kg)  11/06/08 171 lb (77.565 kg)    Body mass index is 30.04 kg/(m^2). Patient meets criteria for Obese Class I based on current BMI.   Current diet order is Heart Healthy, patient is consuming approximately 100% of meals at this time. Labs and medications reviewed.   No nutrition interventions warranted at this time. If nutrition issues arise, please consult RD.   Inda Coke MS, RD, LDN Pager: [phone] After-hours pager: [phone]

## 2013-03-04 NOTE — Progress Notes (Signed)
Pt ambulated in hallway on [place] 4 LPM. After 20  feet, pt began to desat to low 80's. Pt rested standing in place for 1 minute and then resumed walking at a moderate pace with intermittent drops in o2sats to low 80s,. Mild increase noted in WOB. Jonasia Coiner, Beverly Gust, RN

## 2013-03-04 NOTE — Plan of Care (Signed)
Problem: ICU Phase Progression Outcomes Goal: Hemodynamically stable Outcome: Not Progressing Pt not able to maintain sats at baseline of 2-3L O2 with sats >90%

## 2013-03-04 NOTE — Progress Notes (Signed)
CARE MANAGEMENT NOTE 03/04/2013  Patient:  [name], [name]   Account Number:  [id number]  Date Initiated:  03/04/2013  Documentation initiated by:  Noriko Macari  Subjective/Objective Assessment:   pt with resp distress and failed outpt treatment.     Action/Plan:   home when stable   Anticipated DC Date:  03/07/2013   Anticipated DC Plan:  HOME/SELF CARE  In-house referral  NA      DC Planning Services  NA      [facility]  NA   Choice offered to / List presented to:  NA   DME arranged  NA      DME agency  NA     [place] arranged  NA      [place] agency  NA   Status of service:  In process, will continue to follow Medicare Important Message given?  NA - LOS <3 / Initial given by admissions (If response is "NO", the following Medicare IM given date fields will be blank) Date Medicare IM given:   Date Additional Medicare IM given:    Discharge Disposition:    Per UR Regulation:  Reviewed for med. necessity/level of care/duration of stay  If discussed at [place] of Stay Meetings, dates discussed:    Comments:  [id number] Eldridge Dace, [place], [place] [phone] Chart Reviewed for discharge and hospital needs. Discharge needs at time of review:  None Review of patient progress due on [id number].

## 2013-03-04 NOTE — Progress Notes (Signed)
Subjective: Breathing better Still with cough  Objective: Vital signs in last 24 hours: Temp:  [97.4 F (36.3 C)-98.3 F (36.8 C)] 97.8 F (36.6 C) (09/29 0400) Pulse Rate:  [78-97] 78 (09/29 0700) Resp:  [10-35] 21 (09/29 0700) BP: (106-157)/(69-98) 106/69 mmHg (09/29 0600) SpO2:  [77 %-100 %] 93 % (09/29 0700) FiO2 (%):  [100 %] 100 % (09/28 2031) Weight:  [76.9 kg (169 lb 8.5 oz)-77.111 kg (170 lb)] 76.9 kg (169 lb 8.5 oz) (09/29 0000) Weight change:     Intake/Output from previous day: 09/28 0701 - 09/29 0700 In: 540 [P.O.:240; IV Piggyback:300] Out: 700 [Urine:700] Intake/Output this shift:    General appearance: alert Resp: rales bibasilar and bilaterally Cardio: regular rate and rhythm  Lab Results:  Recent Labs  03/03/13 2026 03/04/13 0338  WBC 10.2 9.5  HGB 16.0 15.2  HCT 45.5 44.1  PLT 341 302   BMET  Recent Labs  03/03/13 2026 03/04/13 0338  NA 134* 133*  K 4.5 3.8  CL 99 99  CO2 19 21  GLUCOSE 203* 310*  BUN 12 13  CREATININE 1.08 1.01  CALCIUM 8.8 8.6    Studies/Results: Dg Chest 2 View  03/03/2013   CLINICAL DATA:  Increase shortness of breath. Recent diagnosis of pneumonia.  EXAM: CHEST  2 VIEW  COMPARISON:  01/21/2013  FINDINGS: Extensive interstitial fibrosis is stable. No convincing acute infiltrate. No pleural effusion or pneumothorax.  The cardiac silhouette is mildly enlarged. The mediastinum is normal in contour.  The bony thorax is demineralized.  IMPRESSION: No convincing acute cardiopulmonary disease.  Extensive interstitial fibrosis, stable from the prior study.   Electronically Signed   By: Lajean Manes   On: 03/03/2013 21:12    Medications: I have reviewed the patient's current medications.  Assessment/Plan:   Acute on chronic Respitory failure; ILD/ UIP- recent finish Doxy and prednisone taper with Pulmonary CXR- no PNA/ Chronic ILD changes- normal WBC- ? Need ABX Continue ABX and Solumedrol Abl Neb prn/O2- Pulmonary  f/u DM- continue Oral and SS insulin HTN- BP meds     LOS: 1 day   Terrall Bley 03/04/2013, 7:59 AM

## 2013-03-04 NOTE — Telephone Encounter (Signed)
Called and spoke with pt's wife. Pt has since been admitted since this message was sent to [place].

## 2013-03-05 ENCOUNTER — Encounter (HOSPITAL_COMMUNITY): Payer: Managed Care, Other (non HMO)

## 2013-03-05 MED ORDER — PREDNISONE 10 MG PO TABS
10.0000 mg | ORAL_TABLET | Freq: Every day | ORAL | Status: DC
Start: 2013-03-05 — End: 2013-03-05
  Administered 2013-03-05: 10 mg via ORAL
  Filled 2013-03-05 (×2): qty 1

## 2013-03-05 MED ORDER — POTASSIUM CHLORIDE CRYS ER 10 MEQ PO TBCR: 10.0000 meq | EXTENDED_RELEASE_TABLET | Freq: Every day | ORAL | Status: DC

## 2013-03-05 MED ORDER — AMOXICILLIN-POT CLAVULANATE 875-125 MG PO TABS
1.0000 | ORAL_TABLET | Freq: Two times a day (BID) | ORAL | Status: DC
  Administered 2013-03-05: 1 via ORAL
  Filled 2013-03-05 (×3): qty 1

## 2013-03-05 MED ORDER — FUROSEMIDE 20 MG PO TABS: 20.0000 mg | ORAL_TABLET | Freq: Every day | ORAL | Status: DC

## 2013-03-05 MED ORDER — AMOXICILLIN-POT CLAVULANATE 875-125 MG PO TABS: 1.0000 | ORAL_TABLET | Freq: Two times a day (BID) | ORAL | Status: DC

## 2013-03-05 MED ORDER — FUROSEMIDE 20 MG PO TABS
20.0000 mg | ORAL_TABLET | Freq: Every day | ORAL | Status: DC
  Administered 2013-03-05: 20 mg via ORAL
  Filled 2013-03-05: qty 1

## 2013-03-05 MED ORDER — PREDNISONE 10 MG PO TABS: 10.0000 mg | ORAL_TABLET | Freq: Every day | ORAL | Status: DC

## 2013-03-05 MED ORDER — POTASSIUM CHLORIDE CRYS ER 10 MEQ PO TBCR
10.0000 meq | EXTENDED_RELEASE_TABLET | Freq: Every day | ORAL | Status: DC
  Administered 2013-03-05: 10 meq via ORAL
  Filled 2013-03-05: qty 1

## 2013-03-05 MED ORDER — AMLODIPINE BESYLATE-VALSARTAN 5-160 MG PO TABS: 1.0000 | ORAL_TABLET | Freq: Every day | ORAL | Status: DC

## 2013-03-05 MED ORDER — FLUTICASONE PROPIONATE 50 MCG/ACT NA SUSP: 2.0000 | Freq: Every day | NASAL | Status: DC

## 2013-03-05 MED ORDER — BECLOMETHASONE DIPROPIONATE 80 MCG/ACT IN AERS: 2.0000 | INHALATION_SPRAY | Freq: Two times a day (BID) | RESPIRATORY_TRACT | Status: DC

## 2013-03-05 NOTE — Discharge Summary (Signed)
Physician Discharge Summary  Patient ID: [name] MRN: [id number] DOB/AGE: [birth year] 64 y.o.  Admit date: 03/03/2013 Discharge date: 03/05/2013  Admission Diagnoses:  Discharge Diagnoses:  Principal Problem:   Acute-on-chronic respiratory failure ILD Sinusitis Grade 1 diastolic dysfunction component of asthma/RAD Pulmonary hypertesioion Allergic rhinitis Active Problems:   ACID REFLUX DISEASE   Diabetes mellitus   SOB (shortness of breath) Hypertension    Discharged Condition: good  Hospital Course: 62 yea old male admitted With of breath.Uderlying history of ILD; recently treated for possible bronchitis, outpatient. Problem #1: shortness of breath: acute and chronic Respiratory problem: admitted to the step down Pulmonary consulted; chest x-ray showed- chrnic scarring with ILD- no evidence of pneumonia H was started on antibiotics and Solu-Medrol; within 24 hours he felt  better to his Baseline. Oxygen between 3-4 L nasal cannula. He had 2-D echo- showed grade 1 diastolic dysn as well as moderate  pulmonary hypertension. Also component of allergic Rhinitis, sinusitis His antibiotics change to Augmentin-For snusitis. Prednisone taper over 10 day. Started on Flonase and Qvar- ( possible asthma)  He had PFTs outpatient- did not show any component Obstructive lug disease He Has restrictive  lung disease. Problem #2: 2-D echo showed- grade 1 diastolic dysfunction; moderate pulmonary hypertension- statred on lasix 20 mg. Problem #3: Hypertension: medication  Changed- discontinue Exforge HCT;  Change to Exforge  With lasix  Problems #4: DM- continue on oral agents  Consults: pulmonary/intensive care  Significant Diagnostic Studies: labs: Normal WBC count with normal chemistries and radiology: CXR: Chronic ILD/ fibrosis  Treatments: antibiotics: ceftriaxone and steroids: solu-medrol  Discharge Exam: Blood pressure 150/97, pulse 79, temperature 97.5 F (36.4 C),  temperature source Oral, resp. rate 20, height 5\' 3"  (1.6 m), weight 78.6 kg (173 lb 4.5 oz), SpO2 97.00%. General appearance: alert Resp: rhonchi bibasilar Cardio: regular rate and rhythm Extremities: extremities normal, atraumatic, no cyanosis or edema  Disposition: 01-Home or Self Care  Discharge Orders   Future Appointments Provider Department Dept Phone   03/05/2013 10:30 AM [facility] [place] [phone]   03/07/2013 10:30 AM [facility] [place] [phone]   03/11/2013 11:15 AM Melvenia Needles, NP [facility] [phone]   03/12/2013 10:30 AM [facility] [place] [phone]   03/14/2013 10:30 AM [facility] [place] [phone]   03/19/2013 10:30 AM [facility] [place] [phone]   03/21/2013 10:30 AM [facility] [place] [phone]   03/26/2013 10:30 AM [facility] [place] [phone]   03/28/2013 10:30 AM [facility] [place] [phone]   04/09/2013 10:00 AM Kathee Delton, MD [facility] [phone]   09/12/2013 10:30 AM Casandra Doffing, MD [facility] [phone]   Future Orders Complete By Expires   Diet - low sodium heart healthy  As directed    Increase activity slowly  As directed        Medication List    STOP taking these medications       chlorpheniramine 4 MG tablet  Commonly known as:  CHLOR-TRIMETON     doxycycline 100 MG tablet  Commonly known as:  VIBRA-TABS     EXFORGE HCT 5-160-12.5 MG Tabs  Generic drug:  Amlodipine-Valsartan-HCTZ      TAKE these medications  amLODipine-valsartan 5-160 MG per  tablet  Commonly known as:  EXFORGE  Take 1 tablet by mouth daily.     amoxicillin-clavulanate 875-125 MG per tablet  Commonly known as:  AUGMENTIN  Take 1 tablet by mouth every 12 (twelve) hours.     aspirin 81 MG tablet  Take 81 mg by mouth daily.     atorvastatin 20 MG tablet  Commonly known as:  LIPITOR  Take 20 mg by mouth daily. 1 by mouth daily     beclomethasone 80 MCG/ACT inhaler  Commonly known as:  QVAR  Inhale 2 puffs into the lungs 2 (two) times daily.     dextromethorphan-guaiFENesin 30-600 MG per 12 hr tablet  Commonly known as:  MUCINEX DM  Take 1 tablet by mouth daily as needed (for lung clearance).     diphenhydrAMINE 25 MG tablet  Commonly known as:  BENADRYL  Take 1 tablet (25 mg total) by mouth every 6 (six) hours.     fluticasone 50 MCG/ACT nasal spray  Commonly known as:  FLONASE  Place 2 sprays into the nose daily.     furosemide 20 MG tablet  Commonly known as:  LASIX  Take 1 tablet (20 mg total) by mouth daily.     GLIPIZIDE XL 5 MG 24 hr tablet  Generic drug:  glipiZIDE  Take 5 mg by mouth daily. 1 by mouth daily     multivitamin capsule  Take 1 capsule by mouth daily.     omeprazole 40 MG capsule  Commonly known as:  PRILOSEC  Take 40 mg by mouth daily.     potassium chloride 10 MEQ tablet  Commonly known as:  K-DUR,KLOR-CON  Take 1 tablet (10 mEq total) by mouth daily.     predniSONE 10 MG tablet  Commonly known as:  DELTASONE  Take 1 tablet (10 mg total) by mouth daily with breakfast. 4 tablet for 2 days; 3 tab for 2 days, 2 tab for 2 days, 1 tab for 2 day, 1/2 tab for 2 days taper     Vitamin D 1000 UNITS capsule  Take 1,000 Units by mouth daily.           Follow-up Information   Follow up with [facility]. (If symptoms worsen)    Specialty:  Emergency Medicine   Contact information:   [id number] [place] [address] [phone]      Follow up with  [facility], NP On 03/11/2013. (at 1115)    Specialty:  Nurse Practitioner   Contact information:   [address] [place] [address] [phone]       Follow up with Kathee Delton, MD On 04/09/2013. (10am )    Specialty:  Pulmonary Disease   Contact information:   [address]  [place] [address] [phone]       Discharge time  Total 40 min. SignedWenda Low 03/05/2013, 8:23 AM

## 2013-03-05 NOTE — Progress Notes (Signed)
Subjective: Much improved with IV steroids.  Feels he can go home.   Objective: Vital signs in last 24 hours: Blood pressure 150/97, pulse 79, temperature 97.5 F (36.4 C), temperature source Oral, resp. rate 20, height 5\' 3"  (1.6 m), weight 78.6 kg (173 lb 4.5 oz), SpO2 97.00%.  Intake/Output from previous day: 09/29 0701 - 09/30 0700 In: 1380 [P.O.:1080; IV Piggyback:300] Out: [id number] [Urine:1775]   Physical Exam:   wd male in nad Nose without purulence or d/c noted Neck without LN or TMG Chest with crackles 2/3 up bilat, no wheezing Cor with rrr LE with no significant edema, no cyanosis Alert and oriented, moves all 4.     Lab Results:  Recent Labs  03/03/13 2026 03/04/13 0338  WBC 10.2 9.5  HGB 16.0 15.2  HCT 45.5 44.1  PLT 341 302   BMET  Recent Labs  03/03/13 2026 03/04/13 0338  NA 134* 133*  K 4.5 3.8  CL 99 99  CO2 19 21  GLUCOSE 203* 310*  BUN 12 13  CREATININE 1.08 1.01  CALCIUM 8.8 8.6    Studies/Results: Dg Chest 2 View  03/03/2013   CLINICAL DATA:  Increase shortness of breath. Recent diagnosis of pneumonia.  EXAM: CHEST  2 VIEW  COMPARISON:  01/21/2013  FINDINGS: Extensive interstitial fibrosis is stable. No convincing acute infiltrate. No pleural effusion or pneumothorax.  The cardiac silhouette is mildly enlarged. The mediastinum is normal in contour.  The bony thorax is demineralized.  IMPRESSION: No convincing acute cardiopulmonary disease.  Extensive interstitial fibrosis, stable from the prior study.   Electronically Signed   By: Lajean Manes   On: 03/03/2013 21:12    Assessment/Plan:  1) acute on chronic respiratory failure in a pt with known IPF Based on his repeated history of admission with rapid response to steroids, chronic sinus and allergy symptoms, airflow obstruction on pfts when ill but normal flows when well, I suspect he may have asthma.  I would like to keep him on ICS as outpt to see if this will help  -agree with d/c  home -taper prednisone over 8-10 days -qvar 80  2 inhalations am and pm -will check RAST as outpt. -followup with me in 1-2 weeks.  2) pulmonary htn Echo shows DD with PAP of 69.  Unclear how much is related to DD, and he did have edema at last visit with me.  On the otherhand, he probably has pulmonary ARTERIAL htn as well with his IPF.  His primary is changing his diuretics, and we will see how he does.   Kathee Delton, M.D. 03/05/2013, 8:27 AM

## 2013-03-06 LAB — CULTURE, RESPIRATORY W GRAM STAIN: Culture: NORMAL

## 2013-03-07 ENCOUNTER — Encounter (HOSPITAL_COMMUNITY): Payer: Managed Care, Other (non HMO)

## 2013-03-11 ENCOUNTER — Ambulatory Visit (INDEPENDENT_AMBULATORY_CARE_PROVIDER_SITE_OTHER): Payer: Managed Care, Other (non HMO) | Admitting: Adult Health

## 2013-03-11 ENCOUNTER — Encounter: Payer: Self-pay | Admitting: Adult Health

## 2013-03-11 VITALS — BP 130/80 | HR 77 | Temp 97.9°F | Ht 63.0 in | Wt 173.8 lb

## 2013-03-11 DIAGNOSIS — J84111 Idiopathic interstitial pneumonia, not otherwise specified: Secondary | ICD-10-CM

## 2013-03-11 DIAGNOSIS — G4733 Obstructive sleep apnea (adult) (pediatric): Secondary | ICD-10-CM

## 2013-03-11 NOTE — Patient Instructions (Addendum)
Finish Augmentin and Prednisone as directed.  Please Wear Oxygen 2 l/m with activity .  Restart CPAP At bedtime  .  Follow up Dr. Gwenette Greet in 4 weeks and As needed   .Please contact office for sooner follow up if symptoms do not improve or worsen or seek emergency care

## 2013-03-11 NOTE — Progress Notes (Signed)
  Subjective:    Patient ID: [name], male    DOB: 09/17/1950, 62 y.o.   MRN: [id number]  HPI  62 yo male with known hx of idiopathic pulmonary fibrosis.     03/11/2013 [facility] follow up  Patient returns for a post hospital followup. Patient was admitted September 28 through September 30 for acute on chronic respiratory failure complicated by sinusitis and decompensated diastolic dysfunction Patient was treated with empiric antibiotics, IV steroids. A 2-D echo showed grade 1 diastolic dysfunction, with moderate pulmonary hypertension. He was discharged on a prednisone taper, and Augmentin. Was started on Lasix 20 mg daily. Exforge was stopped.  Since discharge. Patient is feeling much better. Decreased dyspnea. Was able to sing in church on Sunday.  He has few days left of augmentin .  Has a few days left of prednisone. Flu shot is up-to-date. Has not restarted CPAP since discharge .  Did not wear O2 in today with walking, Sats ~88% on RA .  We discussed importance of O2 .  Patient denies any hemoptysis, orthopnea, PND, or leg swelling. Cough and congestion have improved.     Review of Systems  Constitutional:   No  weight loss, night sweats,  Fevers, chills,  +fatigue, or  lassitude.  HEENT:   No headaches,  Difficulty swallowing,  Tooth/dental problems, or  Sore throat,                No sneezing, itching, ear ache,  +nasal congestion, post nasal drip,   CV:  No chest pain,  Orthopnea, PND, swelling in lower extremities, anasarca, dizziness, palpitations, syncope.   GI  No heartburn, indigestion, abdominal pain, nausea, vomiting, diarrhea, change in bowel habits, loss of appetite, bloody stools.   Resp:  .  No wheezing.  No chest wall deformity  Skin: no rash or lesions.  GU: no dysuria, change in color of urine, no urgency or frequency.  No flank pain, no hematuria   MS:  No joint pain or swelling.  No decreased range of motion.  No back pain.  Psych:  No  change in mood or affect. No depression or anxiety.  No memory loss.         Objective:   Physical Exam   GEN: A/Ox3; pleasant , NAD, elderly  HEENT:  /AT,  EACs-clear, TMs-wnl, NOSE-clear, THROAT-clear, no lesions, no postnasal drip or exudate noted.   NECK:  Supple w/ fair ROM; no JVD; normal carotid impulses w/o bruits; no thyromegaly or nodules palpated; no lymphadenopathy.  RESP  Bibasilar crackles no accessory muscle use, no dullness to percussion  CARD:  RRR, no m/r/g  ,tr  peripheral edema, pulses intact, no cyanosis or clubbing.  GI:   Soft & nt; nml bowel sounds; no organomegaly or masses detected.  Musco: Warm bil, no deformities or joint swelling noted.   Neuro: alert, no focal deficits noted.    Skin: Warm, no lesions or rashes        Assessment & Plan:

## 2013-03-11 NOTE — Assessment & Plan Note (Addendum)
Recent exacerbation, associated with diastolic dysfunction, and sinusitis. Patient is improving on antibiotics, and steroid taper. Lower extremity edema. Is improved. The Lasix and discontinuation of Norvasc. Patient was counseled on the importance of oxygen with activity Also educated on importance of CPAP compliance  Plan  Finish Augmentin and Prednisone as directed.  Please Wear Oxygen 2 l/m with activity .  Restart CPAP At bedtime  .  Follow up Dr. Gwenette Greet in 4 weeks and As needed   .Please contact office for sooner follow up if symptoms do not improve or worsen or seek emergency care

## 2013-03-11 NOTE — Progress Notes (Signed)
Ov reviewed and agree with plan as outlined.  

## 2013-03-11 NOTE — Assessment & Plan Note (Signed)
Encouraged to restart CPAP at bedtime. Weight loss, is encouraged.

## 2013-03-12 ENCOUNTER — Encounter (HOSPITAL_COMMUNITY)
Admission: RE | Admit: 2013-03-12 | Discharge: 2013-03-12 | Disposition: A | Discharge status: Home / Self Care | Payer: Managed Care, Other (non HMO) | Source: Ambulatory Visit | Attending: Pulmonary Disease | Admitting: Pulmonary Disease

## 2013-03-12 DIAGNOSIS — Z5189 Encounter for other specified aftercare: Secondary | ICD-10-CM | POA: Insufficient documentation

## 2013-03-12 DIAGNOSIS — J8410 Pulmonary fibrosis, unspecified: Secondary | ICD-10-CM | POA: Insufficient documentation

## 2013-03-12 DIAGNOSIS — I359 Nonrheumatic aortic valve disorder, unspecified: Secondary | ICD-10-CM | POA: Insufficient documentation

## 2013-03-12 DIAGNOSIS — G4733 Obstructive sleep apnea (adult) (pediatric): Secondary | ICD-10-CM | POA: Insufficient documentation

## 2013-03-12 DIAGNOSIS — I10 Essential (primary) hypertension: Secondary | ICD-10-CM | POA: Insufficient documentation

## 2013-03-14 ENCOUNTER — Encounter (HOSPITAL_COMMUNITY)
Admission: RE | Admit: 2013-03-14 | Discharge: 2013-03-14 | Disposition: A | Discharge status: Home / Self Care | Payer: Managed Care, Other (non HMO) | Source: Ambulatory Visit | Attending: Pulmonary Disease | Admitting: Pulmonary Disease

## 2013-03-19 ENCOUNTER — Encounter (HOSPITAL_COMMUNITY)
Admission: RE | Admit: 2013-03-19 | Discharge: 2013-03-19 | Disposition: A | Discharge status: Home / Self Care | Payer: Managed Care, Other (non HMO) | Source: Ambulatory Visit | Attending: Pulmonary Disease | Admitting: Pulmonary Disease

## 2013-03-21 ENCOUNTER — Encounter (HOSPITAL_COMMUNITY)
Admission: RE | Admit: 2013-03-21 | Discharge: 2013-03-21 | Disposition: A | Discharge status: Home / Self Care | Payer: Managed Care, Other (non HMO) | Source: Ambulatory Visit | Attending: Pulmonary Disease | Admitting: Pulmonary Disease

## 2013-03-23 NOTE — ED Provider Notes (Signed)
CSN: [id number]     Arrival date & time 03/03/13  2011 History   First MD Initiated Contact with Patient 03/03/13 2021     Chief Complaint  Patient presents with  . Pneumonia    HPI Pt was recently dx with pneumonia. Last night he began feeling short of breath , which has gotten progressively worse.   Past Medical History  Diagnosis Date  . Benign neoplasm of colon   . Aortic valve disorders   . Osteoarthrosis, unspecified whether generalized or localized, unspecified site   . Degeneration of intervertebral disc, site unspecified   . Other and unspecified hyperlipidemia   . Unspecified essential hypertension   . Obstructive sleep apnea (adult) (pediatric)   . Esophageal reflux   . 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 Pneumonia     interstitial pneumonia  . Diabetes mellitus without complication    Past Surgical History  Procedure Laterality Date  . Lung biopsy  2010   Family History  Problem Relation Age of Onset  . Pancreatic cancer Brother   . Heart disease Father    History  Substance Use Topics  . Smoking status: Former Smoker -- 0.30 packs/day for 20 years    Types: Cigarettes    Quit date: 06/06/2006  . Smokeless tobacco: Never Used  . Alcohol Use: No    Review of Systems  All other systems reviewed and are negative Allergies  Levofloxacin  Home Medications   Current Outpatient Rx  Name  Route  Sig  Dispense  Refill  . aspirin 81 MG tablet   Oral   Take 81 mg by mouth daily.          Marland Kitchen atorvastatin (LIPITOR) 20 MG tablet   Oral   Take 20 mg by mouth daily. 1 by mouth daily         . Cholecalciferol (VITAMIN D) 1000 UNITS capsule   Oral   Take 1,000 Units by mouth daily.          Marland Kitchen dextromethorphan-guaiFENesin (MUCINEX DM) 30-600 MG per 12 hr tablet   Oral   Take 1 tablet by mouth daily as needed (for lung clearance).         Marland Kitchen GLIPIZIDE XL 5 MG 24 hr tablet   Oral   Take 5 mg by mouth daily. 1 by mouth  daily         . Multiple Vitamin (MULTIVITAMIN) capsule   Oral   Take 1 capsule by mouth daily.          Marland Kitchen omeprazole (PRILOSEC) 40 MG capsule   Oral   Take 40 mg by mouth daily.          Marland Kitchen amLODipine-valsartan (EXFORGE) 5-160 MG per tablet   Oral   Take 1 tablet by mouth daily.   30 tablet   6   . amoxicillin-clavulanate (AUGMENTIN) 875-125 MG per tablet   Oral   Take 1 tablet by mouth every 12 (twelve) hours.   20 tablet   0   . beclomethasone (QVAR) 80 MCG/ACT inhaler   Inhalation   Inhale 2 puffs into the lungs 2 (two) times daily.   1 Inhaler   12   . diphenhydrAMINE (BENADRYL) 25 MG tablet   Oral   Take 1 tablet (25 mg total) by mouth every 6 (six) hours.   20 tablet   0   . fluticasone (FLONASE) 50 MCG/ACT nasal spray   Nasal   Place 2  sprays into the nose daily.   30 g   2   . furosemide (LASIX) 20 MG tablet   Oral   Take 1 tablet (20 mg total) by mouth daily.   30 tablet   3   . potassium chloride (K-DUR,KLOR-CON) 10 MEQ tablet   Oral   Take 1 tablet (10 mEq total) by mouth daily.   30 tablet   3   . predniSONE (DELTASONE) 10 MG tablet   Oral   Take 1 tablet (10 mg total) by mouth daily with breakfast. 4 tablet for 2 days; 3 tab for 2 days, 2 tab for 2 days, 1 tab for 2 day, 1/2 tab for 2 days taper   22 tablet   0    BP 150/97  Pulse 79  Temp(Src) 96.3 F (35.7 C) (Axillary)  Resp 20  Ht 5\' 3"  (1.6 m)  Wt 173 lb 4.5 oz (78.6 kg)  BMI 30.7 kg/m2  SpO2 97% Physical Exam  Nursing note and vitals reviewed. Constitutional: He is oriented to person, place, and time. He appears well-developed and well-nourished. No distress.  HENT:  Head: Normocephalic and atraumatic.  Eyes: Pupils are equal, round, and reactive to light.  Neck: Normal range of motion.  Cardiovascular: Normal rate and intact distal pulses.   Pulmonary/Chest: Tachypnea noted. He is in respiratory distress. He has wheezes. He has no rales.  Abdominal: Normal  appearance. He exhibits no distension.  Musculoskeletal: Normal range of motion.  Neurological: He is alert and oriented to person, place, and time. No cranial nerve deficit.  Skin: Skin is warm and dry. No rash noted.  Psychiatric: He has a normal mood and affect. His behavior is normal.    ED Course  Procedures (including critical care time)  Medications  albuterol (PROVENTIL) (5 MG/ML) 0.5% nebulizer solution 5 mg (5 mg Nebulization Given 03/03/13 2043)  ipratropium (ATROVENT) nebulizer solution 0.5 mg (0.5 mg Nebulization Given 03/03/13 2042)  methylPREDNISolone sodium succinate (SOLU-MEDROL) 125 mg/2 mL injection 250 mg (125 mg Intravenous Given 03/03/13 2223)  influenza vac split quadrivalent PF (FLUARIX) injection 0.5 mL (0.5 mLs Intramuscular Given 03/05/13 0930)    Labs Review Labs Reviewed  CBC WITH DIFFERENTIAL - Abnormal; Notable for the following:    Monocytes Absolute 1.1 (*)    All other components within normal limits  COMPREHENSIVE METABOLIC PANEL - Abnormal; Notable for the following:    Sodium 134 (*)    Glucose, Bld 203 (*)    Albumin 3.0 (*)    AST 46 (*)    GFR calc non Af Amer 71 (*)    GFR calc Af Amer 82 (*)    All other components within normal limits  PRO B NATRIURETIC PEPTIDE - Abnormal; Notable for the following:    Pro B Natriuretic peptide (BNP) 243.8 (*)    All other components within normal limits  BASIC METABOLIC PANEL - Abnormal; Notable for the following:    Sodium 133 (*)    Glucose, Bld 310 (*)    GFR calc non Af Amer 77 (*)    GFR calc Af Amer 89 (*)    All other components within normal limits  HEMOGLOBIN A1C - Abnormal; Notable for the following:    Hemoglobin A1C 8.4 (*)    Mean Plasma Glucose 194 (*)    All other components within normal limits  GLUCOSE, CAPILLARY - Abnormal; Notable for the following:    Glucose-Capillary 163 (*)    All other components within  normal limits  GLUCOSE, CAPILLARY - Abnormal; Notable for the following:     Glucose-Capillary 259 (*)    All other components within normal limits  GLUCOSE, CAPILLARY - Abnormal; Notable for the following:    Glucose-Capillary 230 (*)    All other components within normal limits  GLUCOSE, CAPILLARY - Abnormal; Notable for the following:    Glucose-Capillary 178 (*)    All other components within normal limits  GLUCOSE, CAPILLARY - Abnormal; Notable for the following:    Glucose-Capillary 291 (*)    All other components within normal limits  GLUCOSE, CAPILLARY - Abnormal; Notable for the following:    Glucose-Capillary 261 (*)    All other components within normal limits  CULTURE, EXPECTORATED SPUTUM-ASSESSMENT  MRSA PCR SCREENING  CULTURE, RESPIRATORY (NON-EXPECTORATED)  LEGIONELLA ANTIGEN, URINE  STREP PNEUMONIAE URINARY ANTIGEN  CBC   Imaging Review No results found.      DG Chest 2 View (Final result)  Result time: 03/03/13 21:12:12    Final result by Rad Results In Interface (03/03/13 21:12:12)    Narrative:   CLINICAL DATA: Increase shortness of breath. Recent diagnosis of pneumonia.  EXAM: CHEST 2 VIEW  COMPARISON: 01/21/2013  FINDINGS: Extensive interstitial fibrosis is stable. No convincing acute infiltrate. No pleural effusion or pneumothorax.  The cardiac silhouette is mildly enlarged. The mediastinum is normal in contour.  The bony thorax is demineralized.  IMPRESSION: No convincing acute cardiopulmonary disease.  Extensive interstitial fibrosis, stable from the prior study.      MDM   1. Pulmonary fibrosis   2. Hypoxia   3. Acute-on-chronic respiratory failure   4. Diabetes mellitus   5. Esophageal reflux   6. HTN (hypertension)   7. HLD (hyperlipidemia)   8. SOB (shortness of breath)        Dot Lanes, MD 03/23/13 [phone]

## 2013-03-26 ENCOUNTER — Encounter (HOSPITAL_COMMUNITY)
Admission: RE | Admit: 2013-03-26 | Discharge: 2013-03-26 | Disposition: A | Discharge status: Home / Self Care | Payer: Managed Care, Other (non HMO) | Source: Ambulatory Visit | Attending: Pulmonary Disease | Admitting: Pulmonary Disease

## 2013-03-28 ENCOUNTER — Encounter (HOSPITAL_COMMUNITY)
Admission: RE | Admit: 2013-03-28 | Discharge: 2013-03-28 | Disposition: A | Discharge status: Home / Self Care | Payer: Managed Care, Other (non HMO) | Source: Ambulatory Visit | Attending: Pulmonary Disease | Admitting: Pulmonary Disease

## 2013-04-02 ENCOUNTER — Encounter (HOSPITAL_COMMUNITY)
Admission: RE | Admit: 2013-04-02 | Discharge: 2013-04-02 | Disposition: A | Discharge status: Home / Self Care | Payer: Managed Care, Other (non HMO) | Source: Ambulatory Visit | Attending: Pulmonary Disease | Admitting: Pulmonary Disease

## 2013-04-02 ENCOUNTER — Encounter (HOSPITAL_COMMUNITY): Payer: Self-pay

## 2013-04-02 NOTE — Progress Notes (Signed)
Patient completed a post 6 minute walk test today.  On 12/03/12 [name] walked 1050 feet in 6 minutes.  On 04/02/13 [name] walked 937 feet in 6 minutes.  Details of the walk test and outcomes of the program were faxed to Dr. Gwenette Greet.

## 2013-04-09 ENCOUNTER — Encounter: Payer: Self-pay | Admitting: Pulmonary Disease

## 2013-04-09 ENCOUNTER — Ambulatory Visit (INDEPENDENT_AMBULATORY_CARE_PROVIDER_SITE_OTHER): Payer: Managed Care, Other (non HMO)

## 2013-04-09 ENCOUNTER — Other Ambulatory Visit: Payer: Self-pay | Admitting: Pulmonary Disease

## 2013-04-09 ENCOUNTER — Ambulatory Visit (INDEPENDENT_AMBULATORY_CARE_PROVIDER_SITE_OTHER): Payer: Managed Care, Other (non HMO) | Admitting: Pulmonary Disease

## 2013-04-09 VITALS — BP 162/100 | HR 86 | Temp 97.7°F | Ht 63.5 in | Wt 178.8 lb

## 2013-04-09 DIAGNOSIS — J84111 Idiopathic interstitial pneumonia, not otherwise specified: Secondary | ICD-10-CM

## 2013-04-09 DIAGNOSIS — J8410 Pulmonary fibrosis, unspecified: Secondary | ICD-10-CM

## 2013-04-09 LAB — C-REACTIVE PROTEIN: CRP: 1.4 mg/dL (ref 0.5–20.0)

## 2013-04-09 MED ORDER — PREDNISONE 10 MG PO TABS: ORAL_TABLET | ORAL | Status: DC

## 2013-04-09 NOTE — Patient Instructions (Addendum)
Stop qvar Will start prednisone 40mg  a day for a week, then 30mg  a day for a week, then to 20mg  and stay there until next visit. Stay in pulmonary rehab unless you can do on your own at the gym. Stay on your fluid medication for your edema in your ankles. Will send autoimmune panel today to recheck.  Will check oxygen level overnight on room air to re-evaluate your oxygen needs at night.  followup with me in 4 weeks.

## 2013-04-09 NOTE — Assessment & Plan Note (Signed)
The patient is not doing as well since being off prednisone.  He does not feel that Qvar has helped him.  I suspect his current symptoms have nothing to do with asthma, and that his lung disease appears to be steroid responsive.  This can only occur under two circumstances, the first being that 12% of idiopathic pulmonary fibrosis patients can have a response to steroids.  The other being that he has unrecognized autoimmune disease that is responsible for his usual interstitial pneumonitis.  He has had an elevated rheumatoid factor, but an evaluation with rheumatology was not overly impressive.  Given his subjective decline, will restart on prednisone and tried to get him quickly to a maintenance dose of 10-20 mg a day.  We'll also resend his autoimmune panel for completeness.

## 2013-04-09 NOTE — Progress Notes (Signed)
  Subjective:    Patient ID: [name], male    DOB: [birth year], 62 y.o.   MRN: [id number]  HPI The patient comes in today for followup of his known interstitial lung disease, with UIP on biopsy.  The working diagnosis has been idiopathic pulmonary fibrosis, however he has had an elevated rheumatoid factor but a negative evaluation with rheumatology.  He has had recurrent episodes of worsening shortness of breath that appeared to be prednisone responsive, and the question has been raised whether he may have asthma although his PFTs typically showed no airflow obstruction.  He did have spirometry which showed mild obstruction when he was sick with an episode of acute bronchitis.  He has been maintained on Qvar since discharge from the hospital in September, but has really not seen a response to this.  He feels that his breathing has gotten worse since discharge, but denies any significant cough or mucus production.  He does have some postnasal drip noted.   Review of Systems  Constitutional: Negative for fever and unexpected weight change.  HENT: Negative for congestion, dental problem, ear pain, nosebleeds, postnasal drip, rhinorrhea, sinus pressure, sneezing, sore throat and trouble swallowing.   Eyes: Negative for redness and itching.  Respiratory: Positive for cough and shortness of breath. Negative for chest tightness and wheezing.   Cardiovascular: Negative for palpitations and leg swelling.  Gastrointestinal: Negative for nausea and vomiting.  Genitourinary: Negative for dysuria.  Musculoskeletal: Negative for joint swelling.  Skin: Negative for rash.  Neurological: Negative for headaches.  Hematological: Does not bruise/bleed easily.  Psychiatric/Behavioral: Negative for dysphoric mood. The patient is not nervous/anxious.        Objective:   Physical Exam Well-developed male in no acute distress Nose without purulence or discharge noted Oropharynx clear Neck without  lymphadenopathy or thyromegaly Chest with crackles one third the way up bilaterally, no wheezing Cardiac exam with regular rate and rhythm Lower extremities with mild ankle edema, no cyanosis Alert and oriented, moves all 4 extremities.       Assessment & Plan:

## 2013-04-10 LAB — CK TOTAL AND CKMB (NOT AT ARMC): Total CK: 85 U/L (ref 7–232)

## 2013-04-10 LAB — RHEUMATOID FACTOR: Rhuematoid fact SerPl-aCnc: 107 IU/mL — ABNORMAL HIGH (ref <=14)

## 2013-04-10 LAB — CYCLIC CITRUL PEPTIDE ANTIBODY, IGG: Cyclic Citrullin Peptide Ab: <2 U/mL (ref 0.0–5.0)

## 2013-04-10 LAB — ANA: Anti Nuclear Antibody(ANA): NEGATIVE

## 2013-04-10 LAB — ANTI-SCLERODERMA ANTIBODY: Scleroderma (Scl-70) (ENA) Antibody, IgG: 1 AU/mL (ref <30)

## 2013-04-13 LAB — RNP ANTIBODY: Ribonucleic Protein(ENA) Antibody, IgG: <1 AI

## 2013-04-15 ENCOUNTER — Telehealth: Payer: Self-pay | Admitting: Pulmonary Disease

## 2013-04-15 NOTE — Telephone Encounter (Signed)
Spoke with him about his very elevated RF.  Will see back in 3-4 weeks after a trial of prednisone.

## 2013-04-15 NOTE — Telephone Encounter (Signed)
Pt states he received a call from Dr. Gwenette Greet. He states this is the best number to reach him at today # [phone].  Bing, CMA

## 2013-05-08 ENCOUNTER — Ambulatory Visit (INDEPENDENT_AMBULATORY_CARE_PROVIDER_SITE_OTHER): Payer: Managed Care, Other (non HMO) | Admitting: Pulmonary Disease

## 2013-05-08 ENCOUNTER — Encounter: Payer: Self-pay | Admitting: Pulmonary Disease

## 2013-05-08 VITALS — BP 122/82 | HR 80 | Temp 97.4°F | Ht 63.0 in | Wt 177.2 lb

## 2013-05-08 DIAGNOSIS — J8410 Pulmonary fibrosis, unspecified: Secondary | ICD-10-CM

## 2013-05-08 MED ORDER — PREDNISONE 5 MG PO TABS: 5.0000 mg | ORAL_TABLET | ORAL | Status: DC

## 2013-05-08 NOTE — Patient Instructions (Addendum)
Stay on prednisone 20mg  each day for another 2 weeks, then decrease to 17.5mg  each day for 2 weeks, then 15mg  each day and stay there indefinitely.  Please call me if your breathing truly worsens. Will check oxygen level while sleeping, but you must be wearing your cpap as usual.  followup with me again in 4 weeks.

## 2013-05-08 NOTE — Progress Notes (Signed)
   Subjective:    Patient ID: [name], male    DOB: 1950/12/26, 62 y.o.   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 He was started on prednisone at the last visit, and has had a significant improvement in his symptoms. Currently he is down to 20 mg a day.   Review of Systems  Constitutional: Negative for fever and unexpected weight change.  HENT: Negative for congestion, dental problem, ear pain, nosebleeds, postnasal drip, rhinorrhea, sinus pressure, sneezing, sore throat and trouble swallowing.   Eyes: Negative for redness and itching.  Respiratory: Negative for cough, chest tightness, shortness of breath and wheezing.   Cardiovascular: Negative for palpitations and leg swelling.  Gastrointestinal: Negative for nausea and vomiting.  Genitourinary: Negative for dysuria.  Musculoskeletal: Negative for joint swelling.  Skin: Negative for rash.  Neurological: Negative for headaches.  Hematological: Does not bruise/bleed easily.  Psychiatric/Behavioral: Negative for dysphoric mood. The patient is not nervous/anxious.        Objective:   Physical Exam Overweight male in no acute distress Nose without purulence or discharge noted Neck without lymphadenopathy or thyromegaly Chest with crackles one third of the way up bilaterally, no wheezing. Cardiac exam with regular rate and rhythm Lower extremities with trace edema, no cyanosis Alert and oriented, moves all 4 extremities.       Assessment & Plan:

## 2013-05-08 NOTE — Assessment & Plan Note (Signed)
The patient has had improvement in his breathing with chronic prednisone, and we have been able to taper him to 20 mg a day. I will try and get him to 15 mg a day, but if he has worsening of his symptoms, we will need to add Imuran or CellCept to his prednisone. I have also stressed to the patient the importance of a conditioning program and weight loss.

## 2013-05-13 ENCOUNTER — Telehealth: Payer: Self-pay | Admitting: *Deleted

## 2013-05-13 DIAGNOSIS — J8410 Pulmonary fibrosis, unspecified: Secondary | ICD-10-CM

## 2013-05-13 NOTE — Telephone Encounter (Signed)
Let pt know that his overnight study shows that his oxygen level drops to 78%, and was below 88% enough that he should wear oxygen at night at 2 liters.  If he has concentrator, start using at night.  If he doesn't, please send order to DME for 2liters at hs. Thanks.

## 2013-05-13 NOTE — Telephone Encounter (Signed)
ONO results have been received and placed in [facility]sel[facility] folder for review. Please advise, thanks.   

## 2013-05-14 NOTE — Telephone Encounter (Signed)
LMOM x 1 

## 2013-05-17 NOTE — Telephone Encounter (Signed)
Results have been explained to patient, pt expressed understanding. Nothing further needed. Order placed.

## 2013-05-17 NOTE — Addendum Note (Signed)
Addended by: Virl Cagey on: 05/17/2013 10:39 AM   Modules accepted: Orders

## 2013-05-20 ENCOUNTER — Telehealth: Payer: Self-pay | Admitting: Pulmonary Disease

## 2013-05-20 DIAGNOSIS — G4733 Obstructive sleep apnea (adult) (pediatric): Secondary | ICD-10-CM

## 2013-05-20 NOTE — Telephone Encounter (Signed)
I spoke with carol. She reports they need clarification regarding O2 order they received. Pt has CPAP but they only received order for pt to use 2 liters at bedtime of O2. We need to send an order stating pt needs the O2 2 liters blended into CPAP if this is what [facility] is wanting. I advised will get clarification. Please advise [place] thanks

## 2013-05-20 NOTE — Telephone Encounter (Signed)
Order has been sent to [facility]. Nothing further needed.

## 2013-05-20 NOTE — Telephone Encounter (Signed)
It doesn't take a rocket scientist to know that if he is on oxygen at night, is on cpap for sleep apnea, then the 2 need to go together like PB and J.

## 2013-06-04 ENCOUNTER — Encounter: Payer: Self-pay | Admitting: Pulmonary Disease

## 2013-06-12 ENCOUNTER — Telehealth: Payer: Self-pay | Admitting: Pulmonary Disease

## 2013-06-12 ENCOUNTER — Ambulatory Visit (INDEPENDENT_AMBULATORY_CARE_PROVIDER_SITE_OTHER): Payer: BC Managed Care – PPO | Admitting: Pulmonary Disease

## 2013-06-12 ENCOUNTER — Encounter: Payer: Self-pay | Admitting: Pulmonary Disease

## 2013-06-12 VITALS — BP 132/78 | HR 86 | Temp 97.4°F | Ht 63.0 in | Wt 179.2 lb

## 2013-06-12 DIAGNOSIS — G4733 Obstructive sleep apnea (adult) (pediatric): Secondary | ICD-10-CM

## 2013-06-12 DIAGNOSIS — J8410 Pulmonary fibrosis, unspecified: Secondary | ICD-10-CM

## 2013-06-12 DIAGNOSIS — J9620 Acute and chronic respiratory failure, unspecified whether with hypoxia or hypercapnia: Secondary | ICD-10-CM

## 2013-06-12 MED ORDER — PREDNISONE 10 MG PO TABS
ORAL_TABLET | ORAL | Status: DC
Start: 2013-06-12 — End: 2013-08-15

## 2013-06-12 NOTE — Patient Instructions (Signed)
Increase prednisone to 40mg  a day for a week, then decrease to 30mg  a day for a week, then decrease to 25mg  a day and stay there. Will refer you back to rheumatology Will send order to [facility] to check into lightweight portable concentrators.  Will arrange followup once you see the rheumatologist.

## 2013-06-12 NOTE — Progress Notes (Signed)
   Subjective:    Patient ID: [name], male    DOB: 06-25-1950, 63 y.o.   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 This was initially felt to be secondary to idiopathic pulmonary fibrosis, but it became clear that he was a very significant steroid responder. Because of this, his autoimmune panel was repeated, and he has had a significant increase in his rheumatoid factor. The patient has been treated with high-dose prednisone with a significant clinical response, and at the last visit the plan was to taper him to a low dose between 5 and 15 mg a day. He comes in today where he has had worsening shortness of breath and falling oxygen saturations since his prednisone has been tapered. He has a mild cough that is dry, but no chest discomfort.   Review of Systems  Constitutional: Negative for fever and unexpected weight change.  HENT: Negative for congestion, dental problem, ear pain, nosebleeds, postnasal drip, rhinorrhea, sinus pressure, sneezing, sore throat and trouble swallowing.   Eyes: Negative for redness and itching.  Respiratory: Negative for cough, chest tightness, shortness of breath and wheezing.   Cardiovascular: Negative for palpitations and leg swelling.  Gastrointestinal: Negative for nausea and vomiting.  Genitourinary: Negative for dysuria.  Musculoskeletal: Negative for joint swelling.  Skin: Negative for rash.  Neurological: Negative for headaches.  Hematological: Does not bruise/bleed easily.  Psychiatric/Behavioral: Negative for dysphoric mood. The patient is not nervous/anxious.        Objective:   Physical Exam Well-developed male in no acute distress Nose without purulence or discharge noted Oropharynx clear Neck without JVD or lymphadenopathy Chest with crackles at least half the way up bilaterally, no wheezing Cardiac exam with regular rate and rhythm Lower extremities with minimal edema, no  cyanosis Alert and oriented, moves all 4 extremities.       Assessment & Plan:

## 2013-06-12 NOTE — Telephone Encounter (Signed)
Patient Instructions      Increase prednisone to 40mg  a day for a week, then decrease to 30mg  a day for a week, then decrease to 25mg  a day and stay there. Will refer you back to rheumatology Will send order to [facility] to check into lightweight portable concentrators.   Will arrange followup once you see the rheumatologist.  ---  I called and spoke with pt. He is needing a refill on his prednisone. I advised pt will refill this. Nothing further needed

## 2013-06-12 NOTE — Assessment & Plan Note (Signed)
The patient has had significant worsening in his breathing and oxygenation since being on a lower dose of prednisone. I think we need to increase his dose back to 40 mg a day, and should consider trying him on another immunosuppressive such as CellCept or Imuran in order to minimize his prednisone dose. I would like to get some input from rheumatology, since they have not seen him since his rheumatoid factor has increased significantly. I would like their opinion about a more aggressive immunosuppressive regimen.

## 2013-07-05 ENCOUNTER — Ambulatory Visit (INDEPENDENT_AMBULATORY_CARE_PROVIDER_SITE_OTHER): Payer: BC Managed Care – PPO | Admitting: Pulmonary Disease

## 2013-07-05 ENCOUNTER — Encounter: Payer: Self-pay | Admitting: Pulmonary Disease

## 2013-07-05 ENCOUNTER — Ambulatory Visit (HOSPITAL_COMMUNITY): Payer: BC Managed Care – PPO | Attending: Pulmonary Disease

## 2013-07-05 VITALS — BP 118/80 | HR 80 | Temp 97.9°F | Ht 63.5 in | Wt 179.2 lb

## 2013-07-05 DIAGNOSIS — J8410 Pulmonary fibrosis, unspecified: Secondary | ICD-10-CM

## 2013-07-05 DIAGNOSIS — R609 Edema, unspecified: Secondary | ICD-10-CM | POA: Insufficient documentation

## 2013-07-05 DIAGNOSIS — M7989 Other specified soft tissue disorders: Secondary | ICD-10-CM | POA: Insufficient documentation

## 2013-07-05 DIAGNOSIS — Z87891 Personal history of nicotine dependence: Secondary | ICD-10-CM | POA: Insufficient documentation

## 2013-07-05 DIAGNOSIS — E119 Type 2 diabetes mellitus without complications: Secondary | ICD-10-CM | POA: Insufficient documentation

## 2013-07-05 DIAGNOSIS — R0602 Shortness of breath: Secondary | ICD-10-CM | POA: Insufficient documentation

## 2013-07-05 MED ORDER — SULFAMETHOXAZOLE-TMP DS 800-160 MG PO TABS: 1.0000 | ORAL_TABLET | Freq: Every day | ORAL | Status: DC

## 2013-07-05 MED ORDER — SULFAMETHOXAZOLE-TMP DS 800-160 MG PO TABS: 1.0000 | ORAL_TABLET | Freq: Two times a day (BID) | ORAL | Status: DC

## 2013-07-05 MED ORDER — AZATHIOPRINE 50 MG PO TABS: 50.0000 mg | ORAL_TABLET | Freq: Every day | ORAL | Status: DC

## 2013-07-05 NOTE — Patient Instructions (Signed)
Will start on imuran 50mg , one each am on full stomach.  After one week, increase to 2 each am. Stay on prednisone 20mg  a day, and will discuss taper at next visit. Start bactrim DS one each am to help prevent infection You will need to come by the lab in 2 weeks to get BMET, CBC, and hepatic profile. Would like to see you back in 4 weeks.

## 2013-07-05 NOTE — Progress Notes (Signed)
   Subjective:    Patient ID: [name], male    DOB: 17-Sep-1950, 63 y.o.   MRN: [id number]  HPI The patient comes in today for followup of his pulmonary fibrosis. He has been responsive to high-dose prednisone, but has worsening below 20 mg a day. He has been found to have a very elevated rheumatoid factor, although the rheumatologist has not found any skeletal evidence for rheumatoid arthritis.  The patient is currently on 25 mg of prednisone a day, and feels that he has both good and bad days from a breathing standpoint. He is here today to discuss possible intensification of his immunosuppressive regimen for his lung disease.   Review of Systems  Constitutional: Negative for fever and unexpected weight change.  HENT: Negative for congestion, dental problem, ear pain, nosebleeds, postnasal drip, rhinorrhea, sinus pressure, sneezing, sore throat and trouble swallowing.   Eyes: Negative for redness and itching.  Respiratory: Positive for shortness of breath. Negative for cough, chest tightness and wheezing.   Cardiovascular: Positive for leg swelling (left ankle). Negative for palpitations.  Gastrointestinal: Negative for nausea and vomiting.  Genitourinary: Negative for dysuria.  Musculoskeletal: Negative for joint swelling.  Skin: Negative for rash.  Neurological: Negative for headaches.  Hematological: Does not bruise/bleed easily.  Psychiatric/Behavioral: Negative for dysphoric mood. The patient is not nervous/anxious.        Objective:   Physical Exam Well-developed male in no acute distress Nose without purulence or discharge noted Neck without lymphadenopathy or thyromegaly Chest with crackles one third the way up bilaterally, no wheezing Cardiac exam with regular rate and rhythm Lower extremities with left greater than right edema, no cyanosis Alert and oriented, moves all 4 extremities.       Assessment & Plan:

## 2013-07-05 NOTE — Assessment & Plan Note (Signed)
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 He has had a steadily rising rheumatoid factor which is now quite high, and has significant response to high-dose prednisone with worsening symptoms below 20 mg a day. It had a long discussion with him about trying more aggressive immunosuppressive therapy. We have discussed trying Imuran and CellCept, and we have decided to start with Imuran. Would hope that we will be able to taper his prednisone to very low doses. It had a long discussion with he and his wife about the side effects of Imuran, and that we will need to monitor his blood work closely for complications. They also understand that he is going to be at risk for opportunistic infections, and will add Bactrim for PCP prophylaxis.

## 2013-07-05 NOTE — Addendum Note (Signed)
Addended by: Virl Cagey on: 07/05/2013 11:39 AM   Modules accepted: Orders

## 2013-07-05 NOTE — Addendum Note (Signed)
Addended by: Virl Cagey on: 07/05/2013 02:02 PM   Modules accepted: Orders

## 2013-07-08 ENCOUNTER — Encounter (HOSPITAL_COMMUNITY): Payer: BC Managed Care – PPO

## 2013-07-10 ENCOUNTER — Encounter: Payer: Self-pay | Admitting: Pulmonary Disease

## 2013-07-11 NOTE — Telephone Encounter (Signed)
If the pt feels he is back to baseline, and his oxygen needs are back to his usual, would not change anything for right now.  Let us know if worsens.

## 2013-07-11 NOTE — Telephone Encounter (Signed)
Copied from [employer] message sent 2.4.15 @ 11:15pm: Message     experiencing increased breathing rate, gone from 3 liters to 5 or continuous flow. constant cough that are hard and dry which increases my breathing rate   Called spoke with patient who reported that he had excessively dry throat, burning in his chest, constant dry cough, increased SOB yesterday; had to increase his O2 from 3lpm to 5lpm.  This morning pt does feel better and is back to baseline >> he has decreased his O2 back down to 3lpm, cough is improved.  Denies any mucus production, wheezing, chest tightness.    Last ov was 1.30.15 w/ [facility]: Patient Instructions     Will start on imuran 50mg , one each am on full stomach. After one week, increase to 2 each am.  Stay on prednisone 20mg  a day, and will discuss taper at next visit.  Start bactrim DS one each am to help prevent infection  You will need to come by the lab in 2 weeks to get BMET, CBC, and hepatic profile.  Would like to see you back in 4 weeks.    Will forward to [facility] for any recommendations.

## 2013-07-29 ENCOUNTER — Other Ambulatory Visit (INDEPENDENT_AMBULATORY_CARE_PROVIDER_SITE_OTHER): Payer: BC Managed Care – PPO

## 2013-07-29 ENCOUNTER — Other Ambulatory Visit: Payer: BC Managed Care – PPO

## 2013-07-29 DIAGNOSIS — J8410 Pulmonary fibrosis, unspecified: Secondary | ICD-10-CM

## 2013-07-29 DIAGNOSIS — R609 Edema, unspecified: Secondary | ICD-10-CM

## 2013-07-29 LAB — HEPATIC FUNCTION PANEL
ALT: 21 U/L (ref 0–53)
AST: 19 U/L (ref 0–37)
Albumin: 3.4 g/dL — ABNORMAL LOW (ref 3.5–5.2)
Alkaline Phosphatase: 72 U/L (ref 39–117)
BILIRUBIN DIRECT: 0.1 mg/dL (ref 0.0–0.3)
BILIRUBIN TOTAL: 1.1 mg/dL (ref 0.3–1.2)
Total Protein: 6.6 g/dL (ref 6.0–8.3)

## 2013-07-29 LAB — CBC
HCT: 49.9 % (ref 39.0–52.0)
Hemoglobin: 16 g/dL (ref 13.0–17.0)
MCHC: 32.1 g/dL (ref 30.0–36.0)
MCV: 90.7 fl (ref 78.0–100.0)
PLATELETS: 238 10*3/uL (ref 150.0–400.0)
RBC: 5.5 Mil/uL (ref 4.22–5.81)
RDW: 16 % — AB (ref 11.5–14.6)
WBC: 11.4 10*3/uL — ABNORMAL HIGH (ref 4.5–10.5)

## 2013-07-29 LAB — BASIC METABOLIC PANEL
BUN: 15 mg/dL (ref 6–23)
CO2: 28 mEq/L (ref 19–32)
Calcium: 9.1 mg/dL (ref 8.4–10.5)
Chloride: 102 mEq/L (ref 96–112)
Creatinine, Ser: 1.3 mg/dL (ref 0.4–1.5)
GFR: 72.68 mL/min (ref >60.00)
Glucose, Bld: 284 mg/dL — ABNORMAL HIGH (ref 70–99)
POTASSIUM: 3.9 meq/L (ref 3.5–5.1)
SODIUM: 137 meq/L (ref 135–145)

## 2013-08-02 ENCOUNTER — Ambulatory Visit (INDEPENDENT_AMBULATORY_CARE_PROVIDER_SITE_OTHER)
Admission: RE | Admit: 2013-08-02 | Discharge: 2013-08-02 | Disposition: A | Discharge status: Home / Self Care | Payer: BC Managed Care – PPO | Source: Ambulatory Visit | Attending: Pulmonary Disease | Admitting: Pulmonary Disease

## 2013-08-02 ENCOUNTER — Ambulatory Visit (INDEPENDENT_AMBULATORY_CARE_PROVIDER_SITE_OTHER): Payer: BC Managed Care – PPO | Admitting: Pulmonary Disease

## 2013-08-02 ENCOUNTER — Encounter: Payer: Self-pay | Admitting: Pulmonary Disease

## 2013-08-02 VITALS — BP 116/82 | HR 80 | Temp 97.3°F | Ht 63.5 in | Wt 180.6 lb

## 2013-08-02 DIAGNOSIS — J8410 Pulmonary fibrosis, unspecified: Secondary | ICD-10-CM

## 2013-08-02 DIAGNOSIS — J9610 Chronic respiratory failure, unspecified whether with hypoxia or hypercapnia: Secondary | ICD-10-CM

## 2013-08-02 MED ORDER — AMOXICILLIN-POT CLAVULANATE 875-125 MG PO TABS: 1.0000 | ORAL_TABLET | Freq: Two times a day (BID) | ORAL | Status: DC

## 2013-08-02 NOTE — Assessment & Plan Note (Signed)
The patient was doing well on his current therapy, and the beginning of this week when he began to have worsening shortness of breath. He has had some increased cough, along with congestion and some yellow tinged mucus. He has also had worsening lower extremity edema, but his weight is similar to last visit. His chest x-ray shows increased density in the right hemithorax, and I have explained to him this could be fluid, worsening fibrosis, or infection. He has remained compliant on his Bactrim prophylaxis. At this point, I would like to increase his diuretic for a few days, and also treat with an antibiotic for possible infection. He is also due very soon for his blood work to monitor his Imuran therapy.

## 2013-08-02 NOTE — Progress Notes (Signed)
   Subjective:    Patient ID: [name], male    DOB: Feb 01, 1951, 63 y.o.   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 He was started on Imuran at the last visit, and we have been trying to slowly taper his prednisone. He feels that he has tolerated the medication well, and had no issues. This past weekend he went to [place] and was quite active, but did well until approximately 4 days ago. Then he began to notice increasing shortness of breath with some decrease in his oxygen level with exertion. He has noticed increased lower extremity edema, and he is bringing up some yellow tinged mucus with increased cough. He has not had any fever. His recent blood work was normal.   Review of Systems  Constitutional: Negative for fever and unexpected weight change.  HENT: Negative for congestion, dental problem, ear pain, nosebleeds, postnasal drip, rhinorrhea, sinus pressure, sneezing, sore throat and trouble swallowing.   Eyes: Negative for redness and itching.  Respiratory: Positive for chest tightness and shortness of breath. Negative for cough and wheezing.   Cardiovascular: Negative for palpitations and leg swelling.  Gastrointestinal: Negative for nausea and vomiting.  Genitourinary: Negative for dysuria.  Musculoskeletal: Negative for joint swelling.  Skin: Negative for rash.  Neurological: Negative for headaches.  Hematological: Does not bruise/bleed easily.  Psychiatric/Behavioral: Negative for dysphoric mood. The patient is not nervous/anxious.        Objective:   Physical Exam Overweight male in no acute distress Nose without purulence or discharge noted Neck without lymphadenopathy or thyromegaly Chest with crackles one half the way up bilaterally, no wheezing Cardiac exam with regular rate and rhythm Lower extremities with 1+ edema bilaterally, no cyanosis Alert and  oriented, moves all 4 extremities.       Assessment & Plan:

## 2013-08-02 NOTE — Addendum Note (Signed)
Addended by: Virl Cagey on: 08/02/2013 11:21 AM   Modules accepted: Orders

## 2013-08-02 NOTE — Patient Instructions (Signed)
Increase lasix to am AND pm dosing for next 4 days. Will treat with augmentin 875mg  one in am and pm for 7 days.  Take on full stomach and with large glass of water. Will add bubble bottle to your oxygen concentrator. Goal oxygen saturation is 90% or greater.  If you are at rest and have high numbers, decrease your oxygen accordingly.  Will find out why you are not eligible for a portable concentrator. Please call me middle of next week to give update on progress, but call if you have worsening symptoms.  followup with me again in 4 weeks.

## 2013-08-07 ENCOUNTER — Encounter: Payer: Self-pay | Admitting: Interventional Cardiology

## 2013-08-08 ENCOUNTER — Encounter: Payer: Self-pay | Admitting: Interventional Cardiology

## 2013-08-14 ENCOUNTER — Telehealth: Payer: Self-pay | Admitting: Pulmonary Disease

## 2013-08-14 ENCOUNTER — Other Ambulatory Visit (INDEPENDENT_AMBULATORY_CARE_PROVIDER_SITE_OTHER): Payer: BC Managed Care – PPO

## 2013-08-14 DIAGNOSIS — J9610 Chronic respiratory failure, unspecified whether with hypoxia or hypercapnia: Secondary | ICD-10-CM

## 2013-08-14 DIAGNOSIS — J8410 Pulmonary fibrosis, unspecified: Secondary | ICD-10-CM

## 2013-08-14 LAB — BASIC METABOLIC PANEL
BUN: 10 mg/dL (ref 6–23)
CO2: 26 meq/L (ref 19–32)
Calcium: 8.8 mg/dL (ref 8.4–10.5)
Chloride: 103 mEq/L (ref 96–112)
Creatinine, Ser: 1.2 mg/dL (ref 0.4–1.5)
GFR: 75.38 mL/min (ref >60.00)
GLUCOSE: 150 mg/dL — AB (ref 70–99)
Potassium: 3.9 mEq/L (ref 3.5–5.1)
SODIUM: 135 meq/L (ref 135–145)

## 2013-08-14 LAB — CBC
HCT: 43.9 % (ref 39.0–52.0)
HEMOGLOBIN: 14.4 g/dL (ref 13.0–17.0)
MCHC: 32.8 g/dL (ref 30.0–36.0)
MCV: 89.5 fl (ref 78.0–100.0)
PLATELETS: 252 10*3/uL (ref 150.0–400.0)
RBC: 4.9 Mil/uL (ref 4.22–5.81)
RDW: 16.2 % — ABNORMAL HIGH (ref 11.5–14.6)
WBC: 10.1 10*3/uL (ref 4.5–10.5)

## 2013-08-14 NOTE — Telephone Encounter (Signed)
Spoke with pt and gave Dr Gwenette Greet recommendations.  Pt scheduled with Dr Gwenette Greet 08/15/13 at 12:00

## 2013-08-14 NOTE — Telephone Encounter (Signed)
Called and spoke w/ pt. He reports he finished prednisone Sunday. Since he has noticed increase in SOB w/ rest/activity/talking. No wheezing, no chest tx. Just SOB getting worse. Sitting on RA he has been 92-95% 02 levels. W/ exertion on RA he has been dropping as low as 78% RA. But today he is having to use his O2 at 5 liters and at rest he is 92%. Pt was to call with an update. Any recs? Please advise [facility] thanks  Allergies  Allergen Reactions  . Levofloxacin     LOSS OF CONSCIOUSNESS

## 2013-08-14 NOTE — Telephone Encounter (Signed)
The only thing to do is to arrange for ov to be seen.

## 2013-08-15 ENCOUNTER — Ambulatory Visit (INDEPENDENT_AMBULATORY_CARE_PROVIDER_SITE_OTHER): Payer: BC Managed Care – PPO | Admitting: Pulmonary Disease

## 2013-08-15 ENCOUNTER — Encounter (HOSPITAL_COMMUNITY): Payer: Self-pay | Admitting: *Deleted

## 2013-08-15 ENCOUNTER — Encounter: Payer: Self-pay | Admitting: Pulmonary Disease

## 2013-08-15 ENCOUNTER — Inpatient Hospital Stay (HOSPITAL_COMMUNITY): Payer: BC Managed Care – PPO

## 2013-08-15 ENCOUNTER — Inpatient Hospital Stay (HOSPITAL_COMMUNITY)
Admission: AD | Admit: 2013-08-15 | Discharge: 2013-08-19 | DRG: 196 | Disposition: A | Discharge status: Home / Self Care | Payer: BC Managed Care – PPO | Source: Ambulatory Visit | Attending: Pulmonary Disease | Admitting: Pulmonary Disease

## 2013-08-15 VITALS — BP 108/70 | HR 74 | Temp 97.5°F | Ht 63.0 in | Wt 181.4 lb

## 2013-08-15 DIAGNOSIS — I10 Essential (primary) hypertension: Secondary | ICD-10-CM | POA: Diagnosis present

## 2013-08-15 DIAGNOSIS — J8410 Pulmonary fibrosis, unspecified: Secondary | ICD-10-CM

## 2013-08-15 DIAGNOSIS — Z800 Family history of malignant neoplasm of digestive organs: Secondary | ICD-10-CM

## 2013-08-15 DIAGNOSIS — R059 Cough, unspecified: Secondary | ICD-10-CM

## 2013-08-15 DIAGNOSIS — Z79899 Other long term (current) drug therapy: Secondary | ICD-10-CM

## 2013-08-15 DIAGNOSIS — J9621 Acute and chronic respiratory failure with hypoxia: Secondary | ICD-10-CM | POA: Diagnosis present

## 2013-08-15 DIAGNOSIS — I359 Nonrheumatic aortic valve disorder, unspecified: Secondary | ICD-10-CM | POA: Diagnosis present

## 2013-08-15 DIAGNOSIS — K449 Diaphragmatic hernia without obstruction or gangrene: Secondary | ICD-10-CM

## 2013-08-15 DIAGNOSIS — M1990 Unspecified osteoarthritis, unspecified site: Secondary | ICD-10-CM

## 2013-08-15 DIAGNOSIS — R05 Cough: Secondary | ICD-10-CM

## 2013-08-15 DIAGNOSIS — J9620 Acute and chronic respiratory failure, unspecified whether with hypoxia or hypercapnia: Secondary | ICD-10-CM | POA: Diagnosis present

## 2013-08-15 DIAGNOSIS — R609 Edema, unspecified: Secondary | ICD-10-CM

## 2013-08-15 DIAGNOSIS — K219 Gastro-esophageal reflux disease without esophagitis: Secondary | ICD-10-CM

## 2013-08-15 DIAGNOSIS — J9610 Chronic respiratory failure, unspecified whether with hypoxia or hypercapnia: Secondary | ICD-10-CM

## 2013-08-15 DIAGNOSIS — Z860100 Personal history of colon polyps, unspecified: Secondary | ICD-10-CM

## 2013-08-15 DIAGNOSIS — E785 Hyperlipidemia, unspecified: Secondary | ICD-10-CM | POA: Diagnosis present

## 2013-08-15 DIAGNOSIS — Z7982 Long term (current) use of aspirin: Secondary | ICD-10-CM

## 2013-08-15 DIAGNOSIS — Z9981 Dependence on supplemental oxygen: Secondary | ICD-10-CM

## 2013-08-15 DIAGNOSIS — Z87891 Personal history of nicotine dependence: Secondary | ICD-10-CM

## 2013-08-15 DIAGNOSIS — Z8249 Family history of ischemic heart disease and other diseases of the circulatory system: Secondary | ICD-10-CM

## 2013-08-15 DIAGNOSIS — J84112 Idiopathic pulmonary fibrosis: Principal | ICD-10-CM | POA: Diagnosis present

## 2013-08-15 DIAGNOSIS — G4733 Obstructive sleep apnea (adult) (pediatric): Secondary | ICD-10-CM

## 2013-08-15 DIAGNOSIS — E119 Type 2 diabetes mellitus without complications: Secondary | ICD-10-CM | POA: Diagnosis present

## 2013-08-15 LAB — CBC WITH DIFFERENTIAL/PLATELET
Basophils Absolute: 0 10*3/uL (ref 0.0–0.1)
Basophils Relative: 0 % (ref 0–1)
EOS PCT: 3 % (ref 0–5)
Eosinophils Absolute: 0.3 10*3/uL (ref 0.0–0.7)
HEMATOCRIT: 41 % (ref 39.0–52.0)
Hemoglobin: 13.9 g/dL (ref 13.0–17.0)
LYMPHS ABS: 1.8 10*3/uL (ref 0.7–4.0)
Lymphocytes Relative: 21 % (ref 12–46)
MCH: 29.7 pg (ref 26.0–34.0)
MCHC: 33.9 g/dL (ref 30.0–36.0)
MCV: 87.6 fL (ref 78.0–100.0)
MONOS PCT: 11 % (ref 3–12)
Monocytes Absolute: 1 10*3/uL (ref 0.1–1.0)
Neutro Abs: 5.5 10*3/uL (ref 1.7–7.7)
Neutrophils Relative %: 64 % (ref 43–77)
Platelets: 237 10*3/uL (ref 150–400)
RBC: 4.68 MIL/uL (ref 4.22–5.81)
RDW: 15.3 % (ref 11.5–15.5)
WBC: 8.6 10*3/uL (ref 4.0–10.5)

## 2013-08-15 LAB — MAGNESIUM: MAGNESIUM: 2 mg/dL (ref 1.5–2.5)

## 2013-08-15 LAB — URINALYSIS, ROUTINE W REFLEX MICROSCOPIC
BILIRUBIN URINE: NEGATIVE
GLUCOSE, UA: 100 mg/dL — AB
Hgb urine dipstick: NEGATIVE
Ketones, ur: NEGATIVE mg/dL
LEUKOCYTES UA: NEGATIVE
Nitrite: NEGATIVE
PH: 6.5 (ref 5.0–8.0)
Protein, ur: 30 mg/dL — AB
Specific Gravity, Urine: 1.018 (ref 1.005–1.030)
Urobilinogen, UA: 2 mg/dL — ABNORMAL HIGH (ref 0.0–1.0)

## 2013-08-15 LAB — COMPREHENSIVE METABOLIC PANEL
ALT: 20 U/L (ref 0–53)
AST: 23 U/L (ref 0–37)
Albumin: 3 g/dL — ABNORMAL LOW (ref 3.5–5.2)
Alkaline Phosphatase: 106 U/L (ref 39–117)
BILIRUBIN TOTAL: 0.9 mg/dL (ref 0.3–1.2)
BUN: 8 mg/dL (ref 6–23)
CO2: 25 meq/L (ref 19–32)
CREATININE: 1.09 mg/dL (ref 0.50–1.35)
Calcium: 9.1 mg/dL (ref 8.4–10.5)
Chloride: 99 mEq/L (ref 96–112)
GFR calc Af Amer: 81 mL/min — ABNORMAL LOW (ref >90)
GFR calc non Af Amer: 70 mL/min — ABNORMAL LOW (ref >90)
Glucose, Bld: 165 mg/dL — ABNORMAL HIGH (ref 70–99)
Potassium: 3.9 mEq/L (ref 3.7–5.3)
Sodium: 137 mEq/L (ref 137–147)
Total Protein: 6.6 g/dL (ref 6.0–8.3)

## 2013-08-15 LAB — D-DIMER, QUANTITATIVE: D-Dimer, Quant: 0.31 ug/mL-FEU (ref 0.00–0.48)

## 2013-08-15 LAB — PRO B NATRIURETIC PEPTIDE: Pro B Natriuretic peptide (BNP): 106.3 pg/mL (ref 0–125)

## 2013-08-15 LAB — MRSA PCR SCREENING: MRSA by PCR: NEGATIVE

## 2013-08-15 LAB — GLUCOSE, CAPILLARY: Glucose-Capillary: 210 mg/dL — ABNORMAL HIGH (ref 70–99)

## 2013-08-15 LAB — TROPONIN I: Troponin I: <0.3 ng/mL (ref <0.30)

## 2013-08-15 LAB — URINE MICROSCOPIC-ADD ON: URINE-OTHER: NONE SEEN

## 2013-08-15 LAB — PHOSPHORUS: PHOSPHORUS: 2.4 mg/dL (ref 2.3–4.6)

## 2013-08-15 LAB — LACTIC ACID, PLASMA: Lactic Acid, Venous: 1.1 mmol/L (ref 0.5–2.2)

## 2013-08-15 LAB — PROCALCITONIN: Procalcitonin: <0.1 ng/mL

## 2013-08-15 MED ORDER — INSULIN ASPART 100 UNIT/ML ~~LOC~~ SOLN
0.0000 [IU] | Freq: Three times a day (TID) | SUBCUTANEOUS | Status: DC
  Administered 2013-08-15: 5 [IU] via SUBCUTANEOUS
  Administered 2013-08-16: 15 [IU] via SUBCUTANEOUS
  Administered 2013-08-16: 8 [IU] via SUBCUTANEOUS
  Administered 2013-08-16 – 2013-08-17 (×2): 5 [IU] via SUBCUTANEOUS
  Administered 2013-08-17: 8 [IU] via SUBCUTANEOUS
  Administered 2013-08-17: 5 [IU] via SUBCUTANEOUS
  Administered 2013-08-18: 8 [IU] via SUBCUTANEOUS
  Administered 2013-08-18: 5 [IU] via SUBCUTANEOUS
  Administered 2013-08-18: 11 [IU] via SUBCUTANEOUS
  Administered 2013-08-19: 3 [IU] via SUBCUTANEOUS

## 2013-08-15 MED ORDER — AMLODIPINE BESYLATE 5 MG PO TABS
5.0000 mg | ORAL_TABLET | Freq: Every day | ORAL | Status: DC
  Administered 2013-08-15 – 2013-08-19 (×4): 5 mg via ORAL
  Filled 2013-08-15 (×5): qty 1

## 2013-08-15 MED ORDER — PANTOPRAZOLE SODIUM 40 MG PO TBEC
40.0000 mg | DELAYED_RELEASE_TABLET | Freq: Every day | ORAL | Status: DC
  Administered 2013-08-16 – 2013-08-19 (×4): 40 mg via ORAL
  Filled 2013-08-15 (×4): qty 1

## 2013-08-15 MED ORDER — SODIUM CHLORIDE 0.9 % IV SOLN: 250.0000 mL | INTRAVENOUS | Status: DC | PRN

## 2013-08-15 MED ORDER — SULFAMETHOXAZOLE-TMP DS 800-160 MG PO TABS
1.0000 | ORAL_TABLET | Freq: Every day | ORAL | Status: DC
  Administered 2013-08-16 – 2013-08-19 (×4): 1 via ORAL
  Filled 2013-08-15 (×4): qty 1

## 2013-08-15 MED ORDER — HEPARIN SODIUM (PORCINE) 5000 UNIT/ML IJ SOLN
5000.0000 [IU] | Freq: Three times a day (TID) | INTRAMUSCULAR | Status: DC
  Administered 2013-08-15 – 2013-08-18 (×10): 5000 [IU] via SUBCUTANEOUS
  Filled 2013-08-15 (×14): qty 1

## 2013-08-15 MED ORDER — ALBUTEROL SULFATE (2.5 MG/3ML) 0.083% IN NEBU: 2.5000 mg | INHALATION_SOLUTION | RESPIRATORY_TRACT | Status: DC | PRN

## 2013-08-15 MED ORDER — SODIUM CHLORIDE 0.9 % IJ SOLN
3.0000 mL | Freq: Two times a day (BID) | INTRAMUSCULAR | Status: DC
  Administered 2013-08-15 – 2013-08-18 (×7): 3 mL via INTRAVENOUS

## 2013-08-15 MED ORDER — AMLODIPINE BESYLATE-VALSARTAN 5-160 MG PO TABS: 1.0000 | ORAL_TABLET | Freq: Every day | ORAL | Status: DC

## 2013-08-15 MED ORDER — ASPIRIN 81 MG PO TABS: 81.0000 mg | ORAL_TABLET | Freq: Every day | ORAL | Status: DC

## 2013-08-15 MED ORDER — AZATHIOPRINE 50 MG PO TABS
100.0000 mg | ORAL_TABLET | Freq: Every day | ORAL | Status: DC
  Administered 2013-08-15 – 2013-08-19 (×5): 100 mg via ORAL
  Filled 2013-08-15 (×5): qty 2

## 2013-08-15 MED ORDER — FUROSEMIDE 20 MG PO TABS
20.0000 mg | ORAL_TABLET | Freq: Two times a day (BID) | ORAL | Status: DC
  Administered 2013-08-15 – 2013-08-19 (×8): 20 mg via ORAL
  Filled 2013-08-15 (×11): qty 1

## 2013-08-15 MED ORDER — SULFAMETHOXAZOLE-TRIMETHOPRIM 800-160 MG PO TABS: 1.0000 | ORAL_TABLET | Freq: Every day | ORAL | Status: DC

## 2013-08-15 MED ORDER — METHYLPREDNISOLONE SODIUM SUCC 125 MG IJ SOLR
60.0000 mg | Freq: Four times a day (QID) | INTRAMUSCULAR | Status: DC
  Administered 2013-08-15 – 2013-08-16 (×3): 60 mg via INTRAVENOUS
  Filled 2013-08-15 (×7): qty 0.96

## 2013-08-15 MED ORDER — ASPIRIN EC 81 MG PO TBEC
81.0000 mg | DELAYED_RELEASE_TABLET | Freq: Every day | ORAL | Status: DC
  Administered 2013-08-16 – 2013-08-19 (×4): 81 mg via ORAL
  Filled 2013-08-15 (×4): qty 1

## 2013-08-15 MED ORDER — FLUTICASONE PROPIONATE 50 MCG/ACT NA SUSP
2.0000 | Freq: Every day | NASAL | Status: DC
  Administered 2013-08-16 – 2013-08-19 (×4): 2 via NASAL
  Filled 2013-08-15: qty 16

## 2013-08-15 MED ORDER — SODIUM CHLORIDE 0.9 % IJ SOLN
3.0000 mL | INTRAMUSCULAR | Status: DC | PRN
  Administered 2013-08-16 (×2): 3 mL via INTRAVENOUS

## 2013-08-15 MED ORDER — IRBESARTAN 150 MG PO TABS
150.0000 mg | ORAL_TABLET | Freq: Every day | ORAL | Status: DC
  Administered 2013-08-15 – 2013-08-19 (×5): 150 mg via ORAL
  Filled 2013-08-15 (×5): qty 1

## 2013-08-15 MED ORDER — ATORVASTATIN CALCIUM 20 MG PO TABS
20.0000 mg | ORAL_TABLET | Freq: Every day | ORAL | Status: DC
  Administered 2013-08-16 – 2013-08-19 (×4): 20 mg via ORAL
  Filled 2013-08-15 (×4): qty 1

## 2013-08-15 NOTE — Assessment & Plan Note (Signed)
The patient has had an acute worsening of his symptoms that I suspect is related to a flare of his interstitial lung disease. He mistakenly discontinued his steroids, and I suspect this is the cause for his exacerbation. He is clearly in respiratory distress with increased work of breathing and worsening hypoxemia, and we'll therefore need admission to the hospital for further treatment. Hopefully he will respond to IV steroids, and will continue him on the Imuran. We'll also be looking carefully for any superimposed process such as heart failure, infection, or thromboembolic disease.

## 2013-08-15 NOTE — Patient Instructions (Signed)
You will need to be admitted to the hospital for high dose IV steroids Will continue imuran for now, along with your bactrim.

## 2013-08-15 NOTE — H&P (Signed)
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   SIGNIFICANT EVENTS / STUDIES:    LINES / TUBES:   CULTURES:   ANTIBIOTICS:   HISTORY OF PRESENT ILLNESS:   The patient is a 63 year old male who I've followed for pulmonary fibrosis with chronic respiratory failure. His lung biopsy is consistent with usual interstitial pneumonitis, however he has had a rising rheumatoid factor over the years. 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 We were unable to wean his prednisone below 20 mg a day, and therefore was started on Imuran as a steroid sparing agent. He has been on the medication for some time now, however he is taken away discontinued his prednisone. He has seen a significant worsening of his breathing, to the point that he desaturates into the 70s despite 5-6 L of oxygen with any type of activity. He is clearly breathless today even at rest with increased work of breathing and accessory muscle use. He will need to be admitted for intravenous steroid there appeared and close monitoring in the ICU stepdown setting.  PAST MEDICAL HISTORY :  Past Medical History  Diagnosis Date  . Benign neoplasm of colon   . Aortic valve disorders   . Osteoarthrosis, unspecified whether generalized or localized, unspecified site   . Degeneration of intervertebral disc, site unspecified   . Other and unspecified hyperlipidemia   . Unspecified essential hypertension   . Obstructive sleep apnea (adult) (pediatric)   . Esophageal reflux   . Diaphragmatic hernia without mention of obstruction  or gangrene   . Pneumonia     interstitial pneumonia  . Diabetes mellitus without complication    Past Surgical History  Procedure Laterality Date  . Lung biopsy  2010   Prior to Admission medications   Medication Sig Start Date End Date Taking? Authorizing Provider  amLODipine-valsartan (EXFORGE) 5-160 MG per tablet Take 1 tablet by mouth daily. 03/05/13   Wenda Low, MD  aspirin 81 MG tablet Take 81 mg by mouth daily.     Historical Provider, MD  atorvastatin (LIPITOR) 20 MG tablet Take 20 mg by mouth daily. 1 by mouth daily 10/04/11   Historical Provider, MD  azaTHIOprine (IMURAN) 50 MG tablet Take 1 tablet (50 mg total) by mouth daily. After one week, increase to 2 every morning. 07/05/13   Kathee Delton, MD  Cholecalciferol (VITAMIN D) 1000 UNITS capsule Take 1,000 Units by mouth daily.     Historical Provider, MD  dextromethorphan-guaiFENesin (MUCINEX DM) 30-600 MG per 12 hr tablet Take 1 tablet by mouth daily as needed (for lung clearance).    Historical Provider, MD  fluticasone (FLONASE) 50 MCG/ACT nasal spray Place 2 sprays into the nose daily. 03/05/13   Wenda Low, MD  furosemide (LASIX) 20 MG tablet Take 20 mg by mouth 2 (two) times daily.  03/05/13   Wenda Low, MD  GLIPIZIDE XL 5 MG 24 hr tablet Take 5 mg by mouth daily. 1 by mouth daily 09/19/11   Historical Provider, MD  Multiple Vitamin (MULTIVITAMIN) capsule Take 1 capsule by mouth daily.  Historical Provider, MD  omeprazole (PRILOSEC) 40 MG capsule Take 40 mg by mouth daily.     Historical Provider, MD  potassium chloride (K-DUR,KLOR-CON) 10 MEQ tablet Take 1 tablet (10 mEq total) by mouth daily. 03/05/13   Wenda Low, MD  sulfamethoxazole-trimethoprim (BACTRIM DS,SEPTRA DS) 800-160 MG per tablet Take 1 tablet by mouth daily.    Historical Provider, MD   Allergies  Allergen Reactions  . Levofloxacin     LOSS OF CONSCIOUSNESS    FAMILY HISTORY:  Family History  Problem Relation Age of Onset  . Pancreatic  cancer Brother   . Heart disease Father    SOCIAL HISTORY:  reports that he quit smoking about 7 years ago. His smoking use included Cigarettes. He has a 6 pack-year smoking history. He has never used smokeless tobacco. He reports that he does not drink alcohol or use illicit drugs.  REVIEW OF SYSTEMS:   Constitutional: Negative for fever and unexpected weight change.  HENT: Negative for congestion, dental problem, ear pain, nosebleeds, postnasal drip, rhinorrhea, sinus pressure, sneezing, sore throat and trouble swallowing.  Eyes: Negative for redness and itching.  Respiratory: Positive for cough, chest tightness and shortness of breath. Negative for wheezing.  Cardiovascular: Negative for palpitations and leg swelling.  Gastrointestinal: Negative for nausea and vomiting.  Genitourinary: Negative for dysuria.  Musculoskeletal: Negative for joint swelling.  Skin: Negative for rash.  Neurological: Negative for headaches.  Hematological: Does not bruise/bleed easily.  Psychiatric/Behavioral: Negative for dysphoric mood. The patient is nervous/anxious.    SUBJECTIVE:  Very short of breath  VITAL SIGNS: Temp:  [97.5 F (36.4 C)] 97.5 F (36.4 C) (03/12 1218) Pulse Rate:  [74-78] 74 (03/12 1219) BP: (108)/(70) 108/70 mmHg (03/12 1218) SpO2:  [79 %-95 %] 95 % (03/12 1219) Weight:  [181 lb 6.4 oz (82.283 kg)] 181 lb 6.4 oz (82.283 kg) (03/12 1218) HEMODYNAMICS:   VENTILATOR SETTINGS:   INTAKE / OUTPUT: Intake/Output   None     PHYSICAL EXAMINATION: Overweight male in moderate respiratory distress even at rest  Nose without purulence or discharge noted  PERRLA, extraocular most are intact  Oropharynx clear without thrush  Neck without lymphadenopathy or thyromegaly  Chest with crackles one half the way up bilaterally, no wheezing  Cardiac exam with mildly tachycardic but regular rhythm  Abdomen soft nontender nondistended bowel sound  Genital exam rectal exam breast exam was  not done not indicated  Lower extremities with mild edema, no cyanosis pulses are intact distally  Alert and oriented, moves all 4 extremities without obvious deficit.  LABS:  CBC  Recent Labs Lab 08/14/13 0738  WBC 10.1  HGB 14.4  HCT 43.9  PLT 252.0   Coag's No results found for this basename: APTT, INR,  in the last 168 hours BMET  Recent Labs Lab 08/14/13 0738  NA 135  K 3.9  CL 103  CO2 26  BUN 10  CREATININE 1.2  GLUCOSE 150*   Electrolytes  Recent Labs Lab 08/14/13 0738  CALCIUM 8.8   Sepsis Markers No results found for this basename: LATICACIDVEN, PROCALCITON, O2SATVEN,  in the last 168 hours ABG No results found for this basename: PHART, PCO2ART, PO2ART,  in the last 168 hours Liver Enzymes No results found for this basename: AST, ALT, ALKPHOS, BILITOT, ALBUMIN,  in the last 168 hours Cardiac Enzymes No results found for this basename: TROPONINI, PROBNP,  in the last 168 hours Glucose No results found for this basename: GLUCAP,  in the last  168 hours  Imaging No results found.   CXR:   ASSESSMENT / PLAN:  PULMONARY A: Acute on Chronic hypoxic resp failure in setting of  Pulmonary Fibrosis -UIP on bx  >The patient has had an acute worsening of his symptoms that I suspect is related to a flare of his interstitial lung disease. He mistakingly discontinued his steroids, and I suspect this is the cause for his exacerbation. He is clearly in respiratory distress with increased work of breathing and worsening hypoxemia, and we'll therefore need admission to the hospital for further treatment. Hopefully he will respond to IV steroids, and will continue him on the Imuran. We'll also be looking carefully for any superimposed process such as heart failure, infection, or thromboembolic disease. OSA on nocturnal CPAP   P:   Begin IV Solumedrol 80 q6  Check cxr  CPAP  Cont imuran and bactrim  Labs pending  Titrate O2 to keep  sat  >90  GASTROINTESTINAL A:  GERD  P:   PPI     ENDOCRINE A:  DM  P:   SSI  Hold glipizide   Best practices : DVT proph  I have personally obtained a history, examined the patient, evaluated laboratory and imaging results, formulated the assessment and plan and placed orders.   [facility] NP- C Pulmonary and [place] Pager: [phone]  08/15/2013, 12:55 PM

## 2013-08-15 NOTE — Progress Notes (Signed)
   Subjective:    Patient ID: [name], male    DOB: 21-Mar-1951, 63 y.o.   MRN: [id number]  HPI The patient is a 63 year old male who I've followed for pulmonary fibrosis with chronic respiratory failure. His lung biopsy is consistent with usual interstitial pneumonitis, however he has had a rising rheumatoid factor over the years. 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 We were unable to wean his prednisone below 20 mg a day, and therefore was started on Imuran as a steroid sparing agent. He has been on the medication for some time now, however he is taken away discontinued his prednisone. He has seen a significant worsening of his breathing, to the point that he desaturates into the 70s despite 5-6 L of oxygen with any type of activity. He is clearly breathless today even at rest with increased work of breathing and accessory muscle use. He will need to be admitted for intravenous steroid there appeared and close monitoring in the ICU stepdown setting.   Review of Systems  Constitutional: Negative for fever and unexpected weight change.  HENT: Negative for congestion, dental problem, ear pain, nosebleeds, postnasal drip, rhinorrhea, sinus pressure, sneezing, sore throat and trouble swallowing.   Eyes: Negative for redness and itching.  Respiratory: Positive for cough, chest tightness and shortness of breath. Negative for wheezing.   Cardiovascular: Negative for palpitations and leg swelling.  Gastrointestinal: Negative for nausea and vomiting.  Genitourinary: Negative for dysuria.  Musculoskeletal: Negative for joint swelling.  Skin: Negative for rash.  Neurological: Negative for headaches.  Hematological: Does not bruise/bleed easily.  Psychiatric/Behavioral: Negative for dysphoric mood. The patient is nervous/anxious.        Objective:   Physical Exam Overweight male in moderate  respiratory distress even at rest Nose without purulence or discharge noted PERRLA, extraocular most are intact Oropharynx clear without thrush Neck without lymphadenopathy or thyromegaly Chest with crackles one half the way up bilaterally, no wheezing Cardiac exam with mildly tachycardic but regular rhythm Abdomen soft nontender nondistended bowel sound Genital exam rectal exam breast exam was not done not indicated Lower extremities with mild edema, no cyanosis pulses are intact distally Alert and oriented, moves all 4 extremities without obvious deficit.        Assessment & Plan:

## 2013-08-16 DIAGNOSIS — E119 Type 2 diabetes mellitus without complications: Secondary | ICD-10-CM

## 2013-08-16 DIAGNOSIS — R0902 Hypoxemia: Secondary | ICD-10-CM

## 2013-08-16 DIAGNOSIS — G4733 Obstructive sleep apnea (adult) (pediatric): Secondary | ICD-10-CM

## 2013-08-16 LAB — GLUCOSE, CAPILLARY
GLUCOSE-CAPILLARY: 261 mg/dL — AB (ref 70–99)
GLUCOSE-CAPILLARY: 223 mg/dL — AB (ref 70–99)
GLUCOSE-CAPILLARY: 265 mg/dL — AB (ref 70–99)
Glucose-Capillary: 318 mg/dL — ABNORMAL HIGH (ref 70–99)

## 2013-08-16 LAB — BASIC METABOLIC PANEL
BUN: 14 mg/dL (ref 6–23)
CALCIUM: 9.5 mg/dL (ref 8.4–10.5)
CHLORIDE: 98 meq/L (ref 96–112)
CO2: 22 mEq/L (ref 19–32)
Creatinine, Ser: 1.09 mg/dL (ref 0.50–1.35)
GFR calc Af Amer: 81 mL/min — ABNORMAL LOW (ref >90)
GFR calc non Af Amer: 70 mL/min — ABNORMAL LOW (ref >90)
GLUCOSE: 352 mg/dL — AB (ref 70–99)
Potassium: 5 mEq/L (ref 3.7–5.3)
SODIUM: 134 meq/L — AB (ref 137–147)

## 2013-08-16 LAB — CBC
HCT: 41.7 % (ref 39.0–52.0)
HEMOGLOBIN: 14.1 g/dL (ref 13.0–17.0)
MCH: 29.5 pg (ref 26.0–34.0)
MCHC: 33.8 g/dL (ref 30.0–36.0)
MCV: 87.2 fL (ref 78.0–100.0)
Platelets: 269 10*3/uL (ref 150–400)
RBC: 4.78 MIL/uL (ref 4.22–5.81)
RDW: 14.9 % (ref 11.5–15.5)
WBC: 5.9 10*3/uL (ref 4.0–10.5)

## 2013-08-16 LAB — CORTISOL: Cortisol, Plasma: 15 ug/dL

## 2013-08-16 MED ORDER — METHYLPREDNISOLONE SODIUM SUCC 125 MG IJ SOLR
80.0000 mg | Freq: Four times a day (QID) | INTRAMUSCULAR | Status: DC
  Administered 2013-08-16 – 2013-08-19 (×13): 80 mg via INTRAVENOUS
  Filled 2013-08-16 (×16): qty 1.28

## 2013-08-16 MED ORDER — GLIPIZIDE ER 5 MG PO TB24
5.0000 mg | ORAL_TABLET | Freq: Every day | ORAL | Status: DC
  Administered 2013-08-16 – 2013-08-19 (×4): 5 mg via ORAL
  Filled 2013-08-16 (×7): qty 1

## 2013-08-16 MED ORDER — INSULIN GLARGINE 100 UNIT/ML ~~LOC~~ SOLN
15.0000 [IU] | Freq: Two times a day (BID) | SUBCUTANEOUS | Status: DC
  Administered 2013-08-16 – 2013-08-18 (×5): 15 [IU] via SUBCUTANEOUS
  Filled 2013-08-16 (×7): qty 0.15

## 2013-08-16 NOTE — Progress Notes (Signed)
CARE MANAGEMENT NOTE 08/16/2013  Patient:  [name], [name]   Account Number:  [id number]  Date Initiated:  08/16/2013  Documentation initiated by:  DAVIS,RHONDA  Subjective/Objective Assessment:   hx of severe pumonary fibrosis and admits to stopping po predinsone on his own, excerbation of copd.     Action/Plan:   home when stable/from home has been indep. in his adl's.   Anticipated DC Date:  08/17/2013   Anticipated DC Plan:  HOME/SELF CARE  In-house referral  NA      DC Planning Services  NA      [facility] Choice  NA   Choice offered to / List presented to:  NA   DME arranged  NA      DME agency  NA     [place] arranged  NA      [place] agency  NA   Status of service:  In process, will continue to follow Medicare Important Message given?  NA - LOS <3 / Initial given by admissions (If response is "NO", the following Medicare IM given date fields will be blank) Date Medicare IM given:   Date Additional Medicare IM given:    Discharge Disposition:    Per UR Regulation:  Reviewed for med. necessity/level of care/duration of stay  If discussed at [place] of Stay Meetings, dates discussed:    Comments:  [id number]/Rhonda Eldridge Dace, [place], [place] [phone] Chart Reviewed for discharge and hospital needs. Discharge needs at time of review:  None present will follow for needs. Patient scheduled to be transferred to med surg floor [id number]. Review of patient progress due on [id number].

## 2013-08-16 NOTE — Progress Notes (Signed)
Pt wife did not bring in his home CPAP machine today.  Pt is still refusing one of the hospital machines.  Pt states that a lot of times he doesn't even wear it at home.  Pt to call if he changes his mind about using one of the hospital provided machines.  RT to monitor and assess as needed.

## 2013-08-16 NOTE — Progress Notes (Signed)
Subjective: Feels better this am with more stable sats, but still requiring high flow oxygen.  Is staying in bed.  Minimal cough.  Objective: Vital signs in last 24 hours: Blood pressure 120/84, pulse 76, temperature 97.6 F (36.4 C), temperature source Axillary, resp. rate 19, height 5\' 3"  (1.6 m), weight 79.1 kg (174 lb 6.1 oz), SpO2 95.00%.  Intake/Output from previous day: 03/12 0701 - 03/13 0700 In: 363 [P.O.:360; I.V.:3] Out: 1275 [Urine:1275]   Physical Exam:   wd male in nad Nose without purulence or d/c noted OP clear, no thrush Neck without LN or TMG Chest with crackles 1/3 up bilat, no wheezing Cor with rrr LE without edema, no cyanosis Alert and oriented, moves all 4.    Lab Results:  Recent Labs  08/14/13 0738 08/15/13 1528 08/16/13 0315  WBC 10.1 8.6 5.9  HGB 14.4 13.9 14.1  HCT 43.9 41.0 41.7  PLT 252.0 237 269   BMET  Recent Labs  08/14/13 0738 08/15/13 1528 08/16/13 0315  NA 135 137 134*  K 3.9 3.9 5.0  CL 103 99 98  CO2 26 25 22   GLUCOSE 150* 165* 352*  BUN 10 8 14   CREATININE 1.2 1.09 1.09  CALCIUM 8.8 9.1 9.5    Studies/Results: Dg Chest Port 1 View  08/15/2013   CLINICAL DATA:  Dyspnea, hypoxia, fibrosis.  EXAM: PORTABLE CHEST - 1 VIEW  COMPARISON:  06/01/2014  FINDINGS: Fibrotic interstitial lung disease with basilar predominance. Lung volumes remain chronically low. There is new obscuration of the lateral right diaphragm. No cardiomegaly. Unchanged hilar adenopathy. No effusion or pneumothorax.  IMPRESSION: Increased density at the right base could reflect pneumonia, superimposed on the patient's severe pulmonary fibrosis.   Electronically Signed   By: Jorje Guild M.D.   On: 08/15/2013 15:30    Assessment/Plan:  PULMONARY  A: Acute on Chronic hypoxic resp failure in setting of Pulmonary Fibrosis -UIP on bx, increasing RF on labs.  Treating with imuran, and trying to keep prednisone dose as low as possible.  Pt stopped prednisone  at home on his own by mistake.  >The patient has had an acute worsening of his symptoms that I suspect is related to a flare of his interstitial lung disease. He mistakingly discontinued his steroids, and I suspect this is the cause for his exacerbation. His CXR shows a little more density in RLL, but he has nothing by history to support an infectious process.  He feels better overnight on IV steroids.  P:  -keep on IV steroids with current dose thru sat/sun.  Anticipate d/c home Monday if doing better. -keep on imuran. -transfer to floor bed, and ambulate  ENDOCRINE  A: DM  P:  SSI  Restart glipizide along with lantus insulin  OSA Continue cpap.     Kathee Delton, M.D. 08/16/2013, 8:42 AM

## 2013-08-16 NOTE — Progress Notes (Signed)
Inpatient Diabetes Program Recommendations  AACE/ADA: New Consensus Statement on Inpatient Glycemic Control (2013)  Target Ranges:  Prepandial:   less than 140 mg/dL      Peak postprandial:   less than 180 mg/dL (1-2 hours)      Critically ill patients:  140 - 180 mg/dL   Reason for Visit: Results for MILBURN, FREENEY (MRN [id number]) as of 08/16/2013 10:22  Ref. Range 08/15/2013 17:55 08/15/2013 22:13 08/16/2013 07:02  Glucose-Capillary Latest Range: 70-99 mg/dL 210 (H) 261 (H) 318 (H)    Diabetes history: type 2 DM Outpatient Diabetes medications: Glipizide XL 5 mg daily Current orders for Inpatient glycemic control: Lantus 15 units BID, Novolog MODERATE correction scale TID, Glipizide XL 5 mg daily   Note: Admitted with shortness of breath, pulmonary fibrosis.  Takes Glipizide 5 mg daily at home.  CBGs on admission greater than 180 mg/dl.  Fasting CBG today is 197 mg/dl.   May only need Lantus 15 units DAILY in hospital. Will continue to follow.  Harvel Ricks RN BSN CDE

## 2013-08-16 NOTE — Progress Notes (Signed)
Pt has order for CPAP but doesn't want to use one of the hospital machines.  Patients family will bring his machine in the am, pt wanting to use the O2 for tonight.  RT to monitor and assess as needed.

## 2013-08-16 NOTE — Plan of Care (Signed)
Problem: Food- and Nutrition-Related Knowledge Deficit (NB-1.1) Goal: Nutrition education Formal process to instruct or train a patient/client in a skill or to impart knowledge to help patients/clients voluntarily manage or modify food choices and eating behavior to maintain or improve health. Outcome: Completed/Met Date Met:  08/16/13 Nutrition Education Note  Dietetic intern consulted for nutrition education regarding diabetes diet re-education.   Dietetic intern provided "Carbohydrate Counting for People with Diabetes" from the Academy of Nutrition and Dietetics. Reviewed patient's dietary recall. Patient reported not drinking sugar sweetened beverages and avoiding concentrated sweets. Dietetic intern reviewed sources of carbohydrates and emphasized that carbohydrates are safe to consume and the focus should be on choosing nutrient dense options and watching portion sizes. Also, emphasized pairing carbohydrate with proteins during meals and snacks.   Dietetic intern discussed why it is important for patient to follow dietary recommendations and emphasized the role of carbohydrates on blood glucose control. Patient was receptive to diet education. Teach back method used.   Expect good compliance.  Body mass index is 30.9 kg/(m^2). Patient meets criteria for obese class I based on current BMI.     Lab Results  Component Value Date    HGBA1C 8.4* 03/03/2013   CBG (last 3)   Recent Labs   08/15/13 2213 08/16/13 0702 08/16/13 1158  GLUCAP 261* 318* 223*    Current diet order is heart healthy/carb modified. Patient is consuming approximately 100% of meals at this time. Labs and medications reviewed. No further nutrition interventions warranted at this time. If additional nutrition issues arise, please re-consult RD.  Claudell Kyle, Dietetic Intern Pager: [phone]

## 2013-08-16 NOTE — Plan of Care (Signed)
I agree with dietetic intern's education note.   Mikey College MS, [place], [place] Pager [phone] After Hours Pager

## 2013-08-17 LAB — GLUCOSE, CAPILLARY
GLUCOSE-CAPILLARY: 284 mg/dL — AB (ref 70–99)
Glucose-Capillary: 267 mg/dL — ABNORMAL HIGH (ref 70–99)
Glucose-Capillary: 342 mg/dL — ABNORMAL HIGH (ref 70–99)
Glucose-Capillary: 286 mg/dL — ABNORMAL HIGH (ref 70–99)
Glucose-Capillary: 265 mg/dL — ABNORMAL HIGH (ref 70–99)
Glucose-Capillary: 222 mg/dL — ABNORMAL HIGH (ref 70–99)
Glucose-Capillary: 248 mg/dL — ABNORMAL HIGH (ref 70–99)

## 2013-08-17 LAB — BASIC METABOLIC PANEL
BUN: 18 mg/dL (ref 6–23)
CO2: 23 mEq/L (ref 19–32)
Calcium: 9.5 mg/dL (ref 8.4–10.5)
Chloride: 98 mEq/L (ref 96–112)
Creatinine, Ser: 0.96 mg/dL (ref 0.50–1.35)
GFR calc Af Amer: >90 mL/min (ref >90)
GFR, EST NON AFRICAN AMERICAN: 86 mL/min — AB (ref >90)
Glucose, Bld: 337 mg/dL — ABNORMAL HIGH (ref 70–99)
Potassium: 4.2 mEq/L (ref 3.7–5.3)
SODIUM: 135 meq/L — AB (ref 137–147)

## 2013-08-17 NOTE — Progress Notes (Signed)
Patient has home CPAP machine at bedside. He uses water from the tap for his humidifier (as he does at home). Education provided. He understands the importance of using distilled/sterile water but states he has no plans to change his habit due to his son drinking his distilled water at home.

## 2013-08-18 ENCOUNTER — Inpatient Hospital Stay (HOSPITAL_COMMUNITY): Payer: BC Managed Care – PPO

## 2013-08-18 LAB — GLUCOSE, CAPILLARY
GLUCOSE-CAPILLARY: 216 mg/dL — AB (ref 70–99)
Glucose-Capillary: 239 mg/dL — ABNORMAL HIGH (ref 70–99)
Glucose-Capillary: 311 mg/dL — ABNORMAL HIGH (ref 70–99)
Glucose-Capillary: 257 mg/dL — ABNORMAL HIGH (ref 70–99)

## 2013-08-18 MED ORDER — INSULIN GLARGINE 100 UNIT/ML ~~LOC~~ SOLN
20.0000 [IU] | Freq: Every day | SUBCUTANEOUS | Status: DC
  Filled 2013-08-18: qty 0.2

## 2013-08-18 MED ORDER — INSULIN GLARGINE 100 UNIT/ML ~~LOC~~ SOLN
5.0000 [IU] | Freq: Once | SUBCUTANEOUS | Status: AC
  Administered 2013-08-18: 5 [IU] via SUBCUTANEOUS
  Filled 2013-08-18: qty 0.05

## 2013-08-18 MED ORDER — INSULIN GLARGINE 100 UNIT/ML ~~LOC~~ SOLN
20.0000 [IU] | Freq: Two times a day (BID) | SUBCUTANEOUS | Status: DC
  Administered 2013-08-18 – 2013-08-19 (×2): 20 [IU] via SUBCUTANEOUS
  Filled 2013-08-18 (×3): qty 0.2

## 2013-08-18 NOTE — Progress Notes (Signed)
77 yoM with IPF admitted for exacerbation of dyspnea after stopping steroids  Subjective: Feels nearly at baseline. Wants to ambulate.  Objective: Vital signs in last 24 hours: Blood pressure 99/62, pulse 71, temperature 97.6 F (36.4 C), temperature source Oral, resp. rate 16, height 5\' 3"  (1.6 m), weight 78.608 kg (173 lb 4.8 oz), SpO2 92.00%.  Intake/Output from previous day: 03/14 0701 - 03/15 0700 In: 843 [P.O.:840; I.V.:3] Out: 1025 [Urine:1025]   Physical Exam:   wd male in nad Nose clear w/ nasal prongs OP clear, no thrush Neck without LN or TMG Chest with crackles 1/3 up bilat, no wheezing, unlabored at rest. On 4L O2 (3 at home) Cor with rrr LE without edema, no cyanosis Alert and oriented, moves all 4.    Lab Results:  Recent Labs  08/15/13 1528 08/16/13 0315  WBC 8.6 5.9  HGB 13.9 14.1  HCT 41.0 41.7  PLT 237 269   BMET  Recent Labs  08/15/13 1528 08/16/13 0315 08/17/13 0345  NA 137 134* 135*  K 3.9 5.0 4.2  CL 99 98 98  CO2 25 22 23   GLUCOSE 165* 352* 337*  BUN 8 14 18   CREATININE 1.09 1.09 0.96  CALCIUM 9.1 9.5 9.5    Studies/Results: No results found.  Assessment/Plan:  PULMONARY  A: Acute on Chronic hypoxic resp failure in setting of Pulmonary Fibrosis -UIP on bx, increasing RF on labs.  Treating with imuran, and trying to keep prednisone dose as low as possible.  Pt stopped prednisone at home on his own by mistake.  >The patient has had an acute worsening of his symptoms that I suspect is related to a flare of his interstitial lung disease. He mistakingly discontinued his steroids, and I suspect this is the cause for his exacerbation. His CXR shows a little more density in RLL, but he has nothing by history to support an infectious process.  He feels better overnight on IV steroids.  P:  -keep on IV steroids with current dose thru sat/sun.  Anticipate d/c home Monday if doing better. -keep on imuran. - ambulate on O2 - downstairs  CXR  ENDOCRINE  A: DM  Glucose 3/15- 352, 337 P:  SSI Increase lantus insulin to 20  OSA Continue cpap.     CD Annamaria Boots, M.D. PCCM p [phone] Alt P [phone] 08/18/2013, 10:33 AM

## 2013-08-19 DIAGNOSIS — J9610 Chronic respiratory failure, unspecified whether with hypoxia or hypercapnia: Secondary | ICD-10-CM

## 2013-08-19 LAB — GLUCOSE, CAPILLARY: Glucose-Capillary: 161 mg/dL — ABNORMAL HIGH (ref 70–99)

## 2013-08-19 MED ORDER — PREDNISONE 20 MG PO TABS: ORAL_TABLET | ORAL | Status: DC

## 2013-08-19 NOTE — Care Management Note (Signed)
Cm spoke with patient prior to discharge. Patient uses home oxygen therapy supplied by [employer]. Pt resides with spouse. Pt offered home health services. Patient declines resource at this time. Pulmonologist Dr. Danton Sewer.   Venita Lick Khyren Hing,MSN,RN [phone]

## 2013-08-19 NOTE — Progress Notes (Signed)
Patient discharged home, all discharge medications and instructions reviewed and questions answered. Patient assisted to vehicle by wheelchair.

## 2013-08-19 NOTE — Progress Notes (Signed)
18 yoM with UIP secondary to RA admitted for exacerbation of dyspnea after stopping steroids  Subjective: Much improved on IV steroids, walking all over the hospital with oxygen  Objective: Vital signs in last 24 hours: Blood pressure 122/68, pulse 63, temperature 96.4 F (35.8 C), temperature source Axillary, resp. rate 20, height 5\' 3"  (1.6 m), weight 79.561 kg (175 lb 6.4 oz), SpO2 100.00%.  Intake/Output from previous day: 03/15 0701 - 03/16 0700 In: 1203 [P.O.:1200; I.V.:3] Out: 1200 [Urine:1200]   Physical Exam:   wd male in nad Nose without purulence or d/c OP clear, no thrush Neck without LN or TMG Chest with crackles 1/3 up bilat, no wheezing, Cor with rrr LE without edema, no cyanosis Alert and oriented, moves all 4.    Lab Results: No results found for this basename: WBC, HGB, HCT, PLT,  in the last 72 hours BMET  Recent Labs  08/17/13 0345  NA 135*  K 4.2  CL 98  CO2 23  GLUCOSE 337*  BUN 18  CREATININE 0.96  CALCIUM 9.5    Studies/Results: Dg Chest 2 View  08/18/2013   CLINICAL DATA:  Acute on chronic respiratory failure. Hypoxia. Pulmonary fibrosis.  EXAM: CHEST  2 VIEW  COMPARISON:  08/15/2013 and 03/03/2013  FINDINGS: Diffuse coarse interstitial infiltrate show no significant change, consistent with chronic interstitial fibrosis. No acute or superimposed areas of pulmonary opacity identified on today's study. No evidence of pleural effusion. Heart size remains stable and within normal limits.  IMPRESSION: Chronic pulmonary interstitial fibrosis. No acute findings identified.   Electronically Signed   By: Earle Gell M.D.   On: 08/18/2013 13:00    Assessment/Plan:  PULMONARY  A: Acute on Chronic hypoxic resp failure in setting of Pulmonary Fibrosis -UIP on bx, increasing RF on labs.  Treating with imuran, and trying to keep prednisone dose as low as possible.  Pt stopped prednisone at home on his own by mistake.  >The patient has had an acute  worsening of his symptoms that I suspect is related to a flare of his interstitial lung disease. He mistakingly discontinued his steroids, and I suspect this is the cause for his exacerbation. His CXR shows a little more density in RLL, but he has nothing by history to support an infectious process.  He feels better with IV steroids, with improvement in sats and doe.  He is stable to go home.   P:  -d/c home on prednisone 40mg  a day.  -keep on imuran with bactrim prophylaxis -f/u with me in 3 weeks.    ENDOCRINE  A: DM  Glucose 3/15- 352, 337 P:  -d/c home on usual diabetic medication.  He is to call his primary if BS remain elevated.  OSA Continue cpap.     CD Annamaria Boots, M.D. PCCM p [phone] Alt P [phone] 08/19/2013, 8:32 AM

## 2013-08-19 NOTE — Discharge Summary (Signed)
Physician Discharge Summary  Patient ID: [name] MRN: [id number] DOB/AGE: [birth year] 63 y.o.  Admit date: 08/15/2013 Discharge date: 08/19/2013    Discharge Diagnoses:  Acute on Chronic Hypoxic Respiratory Failure Pulmonary Fibrosis / UIP OSA Diabetes HTN HLD                                                                      DISCHARGE PLAN BY DIAGNOSIS    Acute on Chronic Hypoxic Respiratory Failure Pulmonary Fibrosis / UIP OSA  Acute on chronic hypoxic resp failure in setting of Pulmonary Fibrosis - UIP on bx, increasing RF on labs. Treating with imuran, and trying to keep prednisone dose as low as possible. Pt stopped prednisone at home on his own by mistake. On admission, he had an acute worsening of his symptoms thought related to a flare of his interstitial lung disease. He mistakingly discontinued his steroids, and this is likely the cause for his exacerbation. His CXR shows a little more density in RLL, but he has nothing by history to support an infectious process.  He clinically improved with IV steroids -both saturations and DOE.   Discharge Plan: -d/c home on prednisone 53m a day until seen by Dr. CGwenette Greet  -continue imuran with bactrim prophylaxis  -f/u with Dr. CGwenette Greet3 weeks.  -continue nocturnal CPAP  -resume mucinex PRN, fluticasone -PPI daily   DM  Hyperglycemia  Discharge Plan: -d/c home previous home regimen: glipixide -patient instructed to call PCP if blood glucose consistently > 300 for adjustment in regimen   HTN  HLD  Discharge Plan: -continue Exforge, ASA, Lipitor, Lasix                  DISCHARGE SUMMARY   Jonathan WEITZELis a 63y.o. y/o male with a PMH of pulmonary fibrosis & chronic 3L oxygen dependent respiratory failure (followed by Dr. CGwenette Greet.  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  However, he has had a rising rheumatoid factor over the years.  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  He has been unable to wean below 20 mg of Prednisone and was started on Imuran as a steroid sparring agent.   Prior to admission, he mistakenly discontinued his prednisone.  He presented to the pulmonary office on 3/12 with significant worsening of shortness of breath, noted desaturations into the 70's on 5-6L oxygen with activity.  In the office, he was breathless at rest with accessory muscle use and decision was made for direct admission for treatment. Patient was admitted to SDU and treated with IV steroids, CPAP & PRN nebulized bronchodilators.  Initial CXR had a right basilar increased density.  However, he did not have a clinical history to support infectious etiology.  CXR findings in R base cleared prior to discharge.  Patient made significant improvement in regards to DOE & oxygen saturations.  See above for discharge plan.     SIGNIFICANT DIAGNOSTIC STUDIES 3/12 - CXR >> increased density R base superimposed on severe pulmonanry fibrosis 3/15 - CXR >> chronic fibrosis changes, no acute findings   Discharge Exam: General: wdwn male in NAD Neuro: AAOx4, speech clear CV: s1s2  rrr, no m/r/g PULM: resp's even/non-labored, lungs with crackles 1/3 way up bilaterally, no wheeze GI: round/soft, bsx4 active, tol PO's Extremities: warm/dry, no edema or cyanosis   Filed Vitals:   08/18/13 0954 08/18/13 1335 08/18/13 2031 08/19/13 0523  BP: 99/62 107/61 101/71 122/68  Pulse:  76 71 63  Temp:  97.9 F (36.6 C) 97.5 F (36.4 C) 96.4 F (35.8 C)  TempSrc:  Oral Oral Axillary  Resp:  '18 22 20  ' Height:      Weight:    175 lb 6.4 oz (79.561 kg)  SpO2:  98% 99% 100%     Discharge Labs  BMET  Recent Labs Lab 08/14/13 0738 08/15/13 1528 08/16/13 0315 08/17/13 0345  NA 135 137 134* 135*  K 3.9 3.9 5.0 4.2  CL 103 99 98 98  CO2 '26 25 22 23  ' GLUCOSE 150* 165* 352* 337*  BUN '10 8 14 18   ' CREATININE 1.2 1.09 1.09 0.96  CALCIUM 8.8 9.1 9.5 9.5  MG  --  2.0  --   --   PHOS  --  2.4  --   --    CBC  Recent Labs Lab 08/14/13 0738 08/15/13 1528 08/16/13 0315  HGB 14.4 13.9 14.1  HCT 43.9 41.0 41.7  WBC 10.1 8.6 5.9  PLT 252.0 237 269   Anti-Coagulation No results found for this basename: INR,  in the last 168 hours      Discharge Orders   Future Appointments Provider Department Dept Phone   09/09/2013 2:30 PM Kathee Delton, MD [facility] [phone]   09/11/2013 10:15 AM Kathee Delton, MD [facility] [phone]   09/12/2013 10:30 AM Casandra Doffing, MD [facility] [phone]   Future Orders Complete By Expires   Call MD for:  difficulty breathing, headache or visual disturbances  As directed    Call MD for:  hives  As directed    Call MD for:  persistant dizziness or light-headedness  As directed    Call MD for:  temperature >100.4  As directed    Diet - low sodium heart healthy  As directed    Discharge instructions  As directed    Comments:     1. Stay on prednisone 40 mg daily until seen by Dr. Gwenette Greet 2. Monitor your blood sugars closely.  Call your PCP to be seen if consistently greater than 300 3. Oxygen at 3L / [place] continuously 4. Wear your CPAP at night & during daytime napping.   Increase activity slowly  As directed         Medication List         amLODipine-valsartan 5-160 MG per tablet  Commonly known as:  EXFORGE  Take 1 tablet by mouth daily.     aspirin 81 MG tablet  Take 81 mg by mouth daily.     atorvastatin 20 MG tablet  Commonly known as:  LIPITOR  Take 20 mg by mouth daily. 1 by mouth daily     azaTHIOprine 50 MG tablet  Commonly known as:  IMURAN  Take 1 tablet (50 mg total) by mouth daily. After one week, increase to 2 every morning.     dextromethorphan-guaiFENesin 30-600 MG per 12 hr tablet  Commonly known as:  MUCINEX DM  Take 1 tablet by mouth daily as needed (for lung  clearance).     fluticasone 50 MCG/ACT nasal spray  Commonly known as:  FLONASE  Place 2 sprays into  the nose daily.     furosemide 20 MG tablet  Commonly known as:  LASIX  Take 20 mg by mouth 2 (two) times daily.     GLIPIZIDE XL 5 MG 24 hr tablet  Generic drug:  glipiZIDE  Take 5 mg by mouth daily. 1 by mouth daily     multivitamin capsule  Take 1 capsule by mouth daily.     omeprazole 40 MG capsule  Commonly known as:  PRILOSEC  Take 40 mg by mouth daily.     potassium chloride 10 MEQ tablet  Commonly known as:  K-DUR,KLOR-CON  Take 1 tablet (10 mEq total) by mouth daily.     predniSONE 20 MG tablet  Commonly known as:  DELTASONE  2 tabs by mouth daily     sulfamethoxazole-trimethoprim 800-160 MG per tablet  Commonly known as:  BACTRIM DS,SEPTRA DS  Take 1 tablet by mouth daily.     Vitamin D 1000 UNITS capsule  Take 1,000 Units by mouth daily.        Disposition: Home.  Continue home oxygen at 3L continuously & nocturnal CPAP.   Discharged Condition: ARK AGRUSA has met maximum benefit of inpatient care and is medically stable and cleared for discharge.  Patient is pending follow up as above.      Time spent on disposition:  Greater than 35 minutes.   Signed: Noe Gens, NP-C [facility] Pgr: [phone] Office: [phone]

## 2013-08-19 NOTE — Discharge Summary (Signed)
Pt seen and examine this am.  Please see my note for detail.  Agree with documentation above per NP Ollis.

## 2013-08-30 ENCOUNTER — Ambulatory Visit: Payer: BC Managed Care – PPO | Admitting: Pulmonary Disease

## 2013-09-09 ENCOUNTER — Ambulatory Visit (INDEPENDENT_AMBULATORY_CARE_PROVIDER_SITE_OTHER): Payer: BC Managed Care – PPO | Admitting: Pulmonary Disease

## 2013-09-09 ENCOUNTER — Encounter: Payer: Self-pay | Admitting: Pulmonary Disease

## 2013-09-09 VITALS — BP 98/62 | HR 77 | Temp 97.5°F | Ht 63.0 in | Wt 177.6 lb

## 2013-09-09 DIAGNOSIS — J9610 Chronic respiratory failure, unspecified whether with hypoxia or hypercapnia: Secondary | ICD-10-CM

## 2013-09-09 DIAGNOSIS — J8410 Pulmonary fibrosis, unspecified: Secondary | ICD-10-CM

## 2013-09-09 NOTE — Assessment & Plan Note (Signed)
The patient is much better since being back on prednisone along with his Imuran. At this point, I would like to start tapering his prednisone slowly, with the goal of getting him less than 20 mg a day. We may have to increase his Imuran dose to 150 mg a day.  He will be due for his blood work in another 2 weeks.

## 2013-09-09 NOTE — Patient Instructions (Signed)
Decrease prednisone to 30mg  each day for 2 weeks, then decrease to 25mg  each day and stay there.   Will need to check bloodwork in 2 weeks.  Will call you with results. followup with me again in 4 weeks.

## 2013-09-09 NOTE — Progress Notes (Signed)
   Subjective:    Patient ID: [name], male    DOB: Jun 26, 1950, 63 y.o.   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 He is been maintained on Imuran and prednisone, with the hope of getting his prednisone down to 20 mg a day or less.  He recently had to be hospitalized for a flare, associated with the patient discontinuing his prednisone in error. Since being back on prednisone he has done much better, and overall feels he is doing well. He denies any significant cough or congestion, and his saturations today are much improved. He is currently on 40 mg a day.   Review of Systems  Constitutional: Positive for fatigue. Negative for fever and unexpected weight change.  HENT: Negative for congestion, dental problem, ear pain, nosebleeds, postnasal drip, rhinorrhea, sinus pressure, sneezing, sore throat and trouble swallowing.        Dry mouth/throat  Eyes: Negative for redness and itching.  Respiratory: Positive for cough ( with yellow mucus in AM) and shortness of breath (increased x 1 week). Negative for chest tightness and wheezing.   Cardiovascular: Negative for palpitations and leg swelling ( L ankle swelling).  Gastrointestinal: Negative for nausea and vomiting.  Genitourinary: Negative for dysuria.  Musculoskeletal: Negative for joint swelling.  Skin: Negative for rash.  Neurological: Negative for headaches.  Hematological: Does not bruise/bleed easily.  Psychiatric/Behavioral: Negative for dysphoric mood. The patient is not nervous/anxious.        Objective:   Physical Exam Well-developed male in no acute distress Nose without purulence or discharge noted Neck without lymphadenopathy or thyromegaly Chest with crackles one third of the way up bilaterally, but no wheezing Cardiac exam with regular rate and rhythm Lower extremities with 1+ edema, no cyanosis Alert and oriented,  moves all 4 extremities.       Assessment & Plan:

## 2013-09-11 ENCOUNTER — Inpatient Hospital Stay: Payer: BC Managed Care – PPO | Admitting: Pulmonary Disease

## 2013-09-12 ENCOUNTER — Ambulatory Visit: Payer: Managed Care, Other (non HMO) | Admitting: Interventional Cardiology

## 2013-09-23 ENCOUNTER — Other Ambulatory Visit (INDEPENDENT_AMBULATORY_CARE_PROVIDER_SITE_OTHER): Payer: BC Managed Care – PPO

## 2013-09-23 DIAGNOSIS — J8410 Pulmonary fibrosis, unspecified: Secondary | ICD-10-CM

## 2013-09-23 LAB — CBC WITH DIFFERENTIAL/PLATELET
Basophils Absolute: 0.2 10*3/uL — ABNORMAL HIGH (ref 0.0–0.1)
Basophils Relative: 2 % (ref 0.0–3.0)
EOS ABS: 0.1 10*3/uL (ref 0.0–0.7)
Eosinophils Relative: 1.3 % (ref 0.0–5.0)
HCT: 42.3 % (ref 39.0–52.0)
Hemoglobin: 14.2 g/dL (ref 13.0–17.0)
Lymphocytes Relative: 30.6 % (ref 12.0–46.0)
Lymphs Abs: 3.1 10*3/uL (ref 0.7–4.0)
MCHC: 33.7 g/dL (ref 30.0–36.0)
MCV: 89.6 fl (ref 78.0–100.0)
MONO ABS: 0.6 10*3/uL (ref 0.1–1.0)
Monocytes Relative: 5.8 % (ref 3.0–12.0)
NEUTROS ABS: 6 10*3/uL (ref 1.4–7.7)
NEUTROS PCT: 60.3 % (ref 43.0–77.0)
Platelets: 264 10*3/uL (ref 150.0–400.0)
RBC: 4.72 Mil/uL (ref 4.22–5.81)
RDW: 16.8 % — ABNORMAL HIGH (ref 11.5–14.6)
WBC: 10 10*3/uL (ref 4.5–10.5)

## 2013-09-23 LAB — HEPATIC FUNCTION PANEL
ALK PHOS: 64 U/L (ref 39–117)
ALT: 21 U/L (ref 0–53)
AST: 17 U/L (ref 0–37)
Albumin: 3.2 g/dL — ABNORMAL LOW (ref 3.5–5.2)
BILIRUBIN DIRECT: 0.1 mg/dL (ref 0.0–0.3)
Total Bilirubin: 0.8 mg/dL (ref 0.3–1.2)
Total Protein: 6.1 g/dL (ref 6.0–8.3)

## 2013-09-26 ENCOUNTER — Telehealth: Payer: Self-pay | Admitting: Pulmonary Disease

## 2013-09-26 NOTE — Telephone Encounter (Signed)
Does he have enough prednisone to go back on 40 mg daily through the weekend, then call office Monday for further instruction?

## 2013-09-26 NOTE — Telephone Encounter (Signed)
Spoke with the pt and notified of recs per CDY  He reports that he does have plenty of prednisone  He will take 40 mg daily through the w/e and then call on Monday 4/27 for further instructions  He will call sooner if needed

## 2013-09-26 NOTE — Telephone Encounter (Signed)
Spoke with patient about breathing. Pt having difficulty with SOB with tapering dose of Prednisone. Pt started on 40mg  x 2 weeks, then decrease to 30mg  and then down to 25mg .  Pt states that while on 30mg  his breathing was doing fine, but once he decreased his dose down to 25mg  his SOB returned and is worsening. Pt was seen 08/15/13 and was admitted to [facility] for severe SOB Seen 09/09/13 back in office by Dr Gwenette Greet for [facility] and was much improved. Pt states that he does not feel as though his breathing is as bad as it was when he was admitted to the hospital but he feels that he cannot catch his breath. Pt was very short winded on the phone and had a lot of gasping and difficulty talking. Stated he did not feel is was as bad as when he was admitted. Allergies  Allergen Reactions  . Levofloxacin     LOSS OF CONSCIOUSNESS  [facility]  Dr Annamaria Boots, please advise as Dr Gwenette Greet is out of the office this afternoon. Thanks.     Medication List       This list is accurate as of: 09/26/13  2:25 PM.  Always use your most recent med list.               amLODipine-valsartan 5-160 MG per tablet  Commonly known as:  EXFORGE  Take 1 tablet by mouth daily.     aspirin 81 MG tablet  Take 81 mg by mouth daily.     atorvastatin 20 MG tablet  Commonly known as:  LIPITOR  Take 20 mg by mouth daily. 1 by mouth daily     azaTHIOprine 50 MG tablet  Commonly known as:  IMURAN  Take 100 mg by mouth daily.     dextromethorphan-guaiFENesin 30-600 MG per 12 hr tablet  Commonly known as:  MUCINEX DM  Take 1 tablet by mouth daily as needed (for lung clearance).     fluticasone 50 MCG/ACT nasal spray  Commonly known as:  FLONASE  Place 2 sprays into the nose daily.     furosemide 20 MG tablet  Commonly known as:  LASIX  Take 20 mg by mouth 2 (two) times daily.     GLIPIZIDE XL 5 MG 24 hr tablet  Generic drug:  glipiZIDE  Take 5 mg by mouth daily. 1 by mouth daily     multivitamin capsule  Take 1  capsule by mouth daily.     omeprazole 40 MG capsule  Commonly known as:  PRILOSEC  Take 40 mg by mouth daily.     potassium chloride 10 MEQ tablet  Commonly known as:  K-DUR,KLOR-CON  Take 1 tablet (10 mEq total) by mouth daily.     predniSONE 20 MG tablet  Commonly known as:  DELTASONE  2 tabs by mouth daily     sulfamethoxazole-trimethoprim 800-160 MG per tablet  Commonly known as:  BACTRIM DS,SEPTRA DS  Take 1 tablet by mouth daily.     Vitamin D 1000 UNITS capsule  Take 1,000 Units by mouth daily.

## 2013-10-02 LAB — THIOPURINE METHYLTRANSFERASE (TPMT), RBC: Thiopurine Methyltransferase, RBC: 17 (ref >12)

## 2013-10-07 ENCOUNTER — Encounter: Payer: Self-pay | Admitting: Pulmonary Disease

## 2013-10-07 ENCOUNTER — Ambulatory Visit (INDEPENDENT_AMBULATORY_CARE_PROVIDER_SITE_OTHER): Payer: BC Managed Care – PPO | Admitting: Pulmonary Disease

## 2013-10-07 VITALS — BP 110/68 | HR 74 | Temp 96.9°F | Ht 63.5 in | Wt 176.8 lb

## 2013-10-07 DIAGNOSIS — G4733 Obstructive sleep apnea (adult) (pediatric): Secondary | ICD-10-CM

## 2013-10-07 DIAGNOSIS — J9610 Chronic respiratory failure, unspecified whether with hypoxia or hypercapnia: Secondary | ICD-10-CM

## 2013-10-07 DIAGNOSIS — J8410 Pulmonary fibrosis, unspecified: Secondary | ICD-10-CM

## 2013-10-07 MED ORDER — AZATHIOPRINE 100 MG PO TABS: ORAL_TABLET | ORAL | Status: DC

## 2013-10-07 NOTE — Progress Notes (Signed)
   Subjective:    Patient ID: [name], male    DOB: [birth year], 63 y.o.   MRN: [id number]  HPI The patient comes in today for followup of his known interstitial disease with chronic respiratory failure. 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 He has been on Imuran with prednisone since March, and we have been unable to get his prednisone dose below 20 mg. He recently had another flareup that required increasing his prednisone to 40 mg a day, however his Imuran dose is not up to an adequate level as of yet. The patient's other complaint is related to daytime sleepiness. He is wearing his CPAP compliantly, and does not think there is any issue with his device.   Review of Systems  Constitutional: Negative for fever and unexpected weight change.  HENT: Negative for congestion, dental problem, ear pain, nosebleeds, postnasal drip, rhinorrhea, sinus pressure, sneezing, sore throat and trouble swallowing.   Eyes: Negative for redness and itching.  Respiratory: Positive for shortness of breath. Negative for cough, chest tightness and wheezing.   Cardiovascular: Negative for palpitations and leg swelling.  Gastrointestinal: Negative for nausea and vomiting.  Genitourinary: Negative for dysuria.  Musculoskeletal: Negative for joint swelling.  Skin: Negative for rash.  Neurological: Negative for headaches.  Hematological: Does not bruise/bleed easily.  Psychiatric/Behavioral: Negative for dysphoric mood. The patient is not nervous/anxious.        Objective:   Physical Exam Overweight male in no acute distress Nose without purulence or discharge noted Neck without lymphadenopathy or thyromegaly Chest with crackles one third the way up bilaterally, no wheezing Cardiac exam with regular rate and rhythm Lower extremities with 1+ ankle and pedal edema, no cyanosis Alert and oriented, moves all 4 extremities.       Assessment & Plan:

## 2013-10-07 NOTE — Patient Instructions (Signed)
Increase imuran to 150mg  each day.  We can send in a new prescription. Don't forget blood work at the end of each month. (cbc with diff, hepatic profile) Decrease prednisone to 35mg  a day for one week, then 30mg  a day for 2 weeks, then 25mg  a day for 2 weeks, then to 20mg  and stay there.  Will get a download off your cpap machine in light of your increased sleepiness. followup with me again in 8 weeks

## 2013-10-07 NOTE — Assessment & Plan Note (Signed)
The patient is having issues with increased sleepiness during the day, but tells me that he is wearing CPAP compliantly.  I will get a download off his current device, and make sure that he is not having mask leaks or increased pressure needs.

## 2013-10-07 NOTE — Assessment & Plan Note (Signed)
The patient had increasing pulmonary symptoms that required increasing prednisone dose. He feels that he is almost back to baseline at 40 mg a day. The patient's TPMT level was adequate, and therefore will increase his Imuran to 150 mg a day to try and get his prednisone dose minimized. If he continues to require high-dose prednisone, will change over to CellCept.

## 2013-10-07 NOTE — Addendum Note (Signed)
Addended by: Lilli Few on: 10/07/2013 10:22 AM   Modules accepted: Orders

## 2013-11-04 ENCOUNTER — Other Ambulatory Visit (INDEPENDENT_AMBULATORY_CARE_PROVIDER_SITE_OTHER): Payer: BC Managed Care – PPO

## 2013-11-04 DIAGNOSIS — J8410 Pulmonary fibrosis, unspecified: Secondary | ICD-10-CM

## 2013-11-04 LAB — HEPATIC FUNCTION PANEL
ALT: 20 U/L (ref 0–53)
AST: 16 U/L (ref 0–37)
Albumin: 3.8 g/dL (ref 3.5–5.2)
Alkaline Phosphatase: 76 U/L (ref 39–117)
BILIRUBIN DIRECT: 0.1 mg/dL (ref 0.0–0.3)
Total Bilirubin: 0.9 mg/dL (ref 0.2–1.2)
Total Protein: 6.9 g/dL (ref 6.0–8.3)

## 2013-11-04 LAB — CBC WITH DIFFERENTIAL/PLATELET
Basophils Absolute: 0 10*3/uL (ref 0.0–0.1)
Basophils Relative: 0 % (ref 0.0–3.0)
EOS ABS: 0 10*3/uL (ref 0.0–0.7)
Eosinophils Relative: 0 % (ref 0.0–5.0)
HCT: 45.3 % (ref 39.0–52.0)
Hemoglobin: 14.9 g/dL (ref 13.0–17.0)
LYMPHS PCT: 8.3 % — AB (ref 12.0–46.0)
Lymphs Abs: 0.8 10*3/uL (ref 0.7–4.0)
MCHC: 32.8 g/dL (ref 30.0–36.0)
MCV: 94 fl (ref 78.0–100.0)
Monocytes Absolute: 0.2 10*3/uL (ref 0.1–1.0)
Monocytes Relative: 1.5 % — ABNORMAL LOW (ref 3.0–12.0)
Neutro Abs: 8.8 10*3/uL — ABNORMAL HIGH (ref 1.4–7.7)
Platelets: 342 10*3/uL (ref 150.0–400.0)
RBC: 4.82 Mil/uL (ref 4.22–5.81)
RDW: 18 % — AB (ref 11.5–15.5)
WBC: 9.8 10*3/uL (ref 4.0–10.5)

## 2013-11-26 ENCOUNTER — Other Ambulatory Visit: Payer: Self-pay | Admitting: Pulmonary Disease

## 2013-11-29 ENCOUNTER — Telehealth: Payer: Self-pay | Admitting: Pulmonary Disease

## 2013-11-29 MED ORDER — PREDNISONE 20 MG PO TABS: 20.0000 mg | ORAL_TABLET | Freq: Every day | ORAL | Status: DC

## 2013-11-29 NOTE — Telephone Encounter (Signed)
Spoke with the pt  He is requesting refill on pred  Rx was sent to pharm  Nothing further needed

## 2013-12-02 ENCOUNTER — Encounter: Payer: Self-pay | Admitting: Pulmonary Disease

## 2013-12-02 ENCOUNTER — Ambulatory Visit (INDEPENDENT_AMBULATORY_CARE_PROVIDER_SITE_OTHER): Payer: BC Managed Care – PPO | Admitting: Pulmonary Disease

## 2013-12-02 VITALS — BP 130/80 | HR 73 | Temp 96.9°F | Ht 63.5 in | Wt 180.6 lb

## 2013-12-02 DIAGNOSIS — G4733 Obstructive sleep apnea (adult) (pediatric): Secondary | ICD-10-CM

## 2013-12-02 DIAGNOSIS — J8410 Pulmonary fibrosis, unspecified: Secondary | ICD-10-CM

## 2013-12-02 MED ORDER — PREDNISONE 5 MG PO TABS: ORAL_TABLET | ORAL | Status: DC

## 2013-12-02 NOTE — Assessment & Plan Note (Signed)
The patient went a few weeks without prednisone, and I have told him again that he cannot do this. I stressed to him how dangerous this is, and he is to call us immediately if he misses even one day. I have asked him to continue on 20 mg a day of prednisone for now, along with his Imuran. He can decrease to 17.5 mg in the next few weeks if doing well. He will need to have his followup yearly PFTs done in the next month or so, and he needs to keep up with his monthly blood work.

## 2013-12-02 NOTE — Progress Notes (Signed)
   Subjective:    Patient ID: [name], male    DOB: 11-07-1950, 63 y.o.   MRN: [id number]  HPI The patient comes in today for followup of his known idiopathic pulmonary fibrosis with chronic respiratory failure. He also has obstructive sleep apnea for which he has been on CPAP, and his download shows excellent control. The patient has been on prednisone with increasing dose of Imuran, but unfortunately had 2 weeks this month where he did not take prednisone. His prescription ran out, and he did not call us for refills. He knows that his breathing definitely worsen during that time, but now he is back on his 20 mg a day and doing better. He denies any significant cough or mucus production. He has been getting monthly blood work since he is on Imuran, and continues on his Bactrim prophylaxis.   Review of Systems  Constitutional: Negative for fever and unexpected weight change.  HENT: Negative for congestion, dental problem, ear pain, nosebleeds, postnasal drip, rhinorrhea, sinus pressure, sneezing, sore throat and trouble swallowing.   Eyes: Negative for redness and itching.  Respiratory: Positive for shortness of breath. Negative for cough, chest tightness and wheezing.   Cardiovascular: Negative for palpitations and leg swelling.  Gastrointestinal: Negative for nausea and vomiting.  Genitourinary: Negative for dysuria.  Musculoskeletal: Negative for joint swelling.  Skin: Negative for rash.  Neurological: Negative for headaches.  Hematological: Does not bruise/bleed easily.  Psychiatric/Behavioral: Negative for dysphoric mood. The patient is not nervous/anxious.        Objective:   Physical Exam Developed male in no acute distress Nose without purulence or discharge noted Neck without lymphadenopathy or thyromegaly Chest with crackles one third the way up bilaterally, no wheezing Cardiac exam with regular rate and rhythm Lower extremities with 1+ edema, no cyanosis Alert and  oriented, moves all 4 extremities.       Assessment & Plan:

## 2013-12-02 NOTE — Patient Instructions (Signed)
Stay on prednisone 20mg  a day for 2 weeks, then try 17.5 mg a day thereafter. You will need to have breathing studies within next 1-2 mos at your convenience. Will send an order to [facility] to keep up with your oxygen deviveries. You are due for your blood work next 1-2 weeks. followup with me again in 70mos.

## 2013-12-02 NOTE — Assessment & Plan Note (Signed)
The patient is doing well with CPAP by his download, and I have asked him to continue on this.

## 2013-12-17 ENCOUNTER — Other Ambulatory Visit (INDEPENDENT_AMBULATORY_CARE_PROVIDER_SITE_OTHER): Payer: BC Managed Care – PPO

## 2013-12-17 ENCOUNTER — Other Ambulatory Visit: Payer: Self-pay | Admitting: Pulmonary Disease

## 2013-12-17 DIAGNOSIS — J8410 Pulmonary fibrosis, unspecified: Secondary | ICD-10-CM

## 2013-12-17 LAB — CBC WITH DIFFERENTIAL/PLATELET
BASOS ABS: 0.1 10*3/uL (ref 0.0–0.1)
Basophils Relative: 1.1 % (ref 0.0–3.0)
Eosinophils Absolute: 0.5 10*3/uL (ref 0.0–0.7)
Eosinophils Relative: 5.7 % — ABNORMAL HIGH (ref 0.0–5.0)
HEMATOCRIT: 41.6 % (ref 39.0–52.0)
HEMOGLOBIN: 13.7 g/dL (ref 13.0–17.0)
LYMPHS ABS: 1.8 10*3/uL (ref 0.7–4.0)
Lymphocytes Relative: 21.9 % (ref 12.0–46.0)
MCHC: 33 g/dL (ref 30.0–36.0)
MCV: 95.9 fl (ref 78.0–100.0)
Monocytes Absolute: 0.8 10*3/uL (ref 0.1–1.0)
Monocytes Relative: 9.6 % (ref 3.0–12.0)
Neutro Abs: 5.1 10*3/uL (ref 1.4–7.7)
Neutrophils Relative %: 61.7 % (ref 43.0–77.0)
Platelets: 250 10*3/uL (ref 150.0–400.0)
RBC: 4.34 Mil/uL (ref 4.22–5.81)
RDW: 16.6 % — ABNORMAL HIGH (ref 11.5–15.5)
WBC: 8.3 10*3/uL (ref 4.0–10.5)

## 2013-12-17 LAB — HEPATIC FUNCTION PANEL
ALBUMIN: 3.7 g/dL (ref 3.5–5.2)
ALT: 14 U/L (ref 0–53)
AST: 17 U/L (ref 0–37)
Alkaline Phosphatase: 72 U/L (ref 39–117)
Bilirubin, Direct: 0.1 mg/dL (ref 0.0–0.3)
Total Bilirubin: 0.6 mg/dL (ref 0.2–1.2)
Total Protein: 6.4 g/dL (ref 6.0–8.3)

## 2013-12-18 ENCOUNTER — Telehealth: Payer: Self-pay | Admitting: Pulmonary Disease

## 2013-12-18 NOTE — Telephone Encounter (Signed)
Result Note     Please let pt know that his bloodwork all looks good  --  I spoke with patient about results and he verbalized understanding and had no questions

## 2014-01-15 ENCOUNTER — Ambulatory Visit (INDEPENDENT_AMBULATORY_CARE_PROVIDER_SITE_OTHER): Payer: BC Managed Care – PPO | Admitting: Pulmonary Disease

## 2014-01-15 DIAGNOSIS — J8410 Pulmonary fibrosis, unspecified: Secondary | ICD-10-CM

## 2014-01-15 NOTE — Progress Notes (Signed)
PFT done today. 

## 2014-01-16 LAB — PULMONARY FUNCTION TEST
DL/VA % pred: 68 %
DL/VA: 2.82 ml/min/mmHg/L
DLCO UNC: 6.71 ml/min/mmHg
DLCO unc % pred: 28 %
FEF 25-75 Post: 1.97 L/sec
FEF 25-75 Pre: 1.69 L/sec
FEF2575-%Change-Post: 16 %
FEF2575-%Pred-Post: 92 %
FEF2575-%Pred-Pre: 79 %
FEV1-%Change-Post: 1 %
FEV1-%Pred-Post: 65 %
FEV1-%Pred-Pre: 64 %
FEV1-POST: 1.49 L
FEV1-Pre: 1.47 L
FEV1FVC-%CHANGE-POST: 3 %
FEV1FVC-%Pred-Pre: 107 %
FEV6-%Change-Post: -3 %
FEV6-%PRED-PRE: 62 %
FEV6-%Pred-Post: 60 %
FEV6-PRE: 1.77 L
FEV6-Post: 1.72 L
FEV6FVC-%Change-Post: -1 %
FEV6FVC-%PRED-POST: 103 %
FEV6FVC-%PRED-PRE: 105 %
FVC-%Change-Post: -2 %
FVC-%PRED-POST: 57 %
FVC-%Pred-Pre: 59 %
FVC-PRE: 1.77 L
FVC-Post: 1.74 L
POST FEV6/FVC RATIO: 99 %
PRE FEV6/FVC RATIO: 100 %
Post FEV1/FVC ratio: 86 %
Pre FEV1/FVC ratio: 83 %
RV % PRED: 51 %
RV: 1.02 L
TLC % PRED: 50 %
TLC: 2.9 L

## 2014-01-20 ENCOUNTER — Telehealth: Payer: Self-pay | Admitting: Pulmonary Disease

## 2014-01-20 NOTE — Telephone Encounter (Signed)
Notes Recorded by Kathee Delton, MD on 01/17/2014 at 11:45 AM Let pt know that his breathing studies are completely stable. His total lung capacity is almost identical to last year, and his diffusion capacity is a tiny bit better. It is really good that he has not lost ground in one year. Very positive, and the best that we can hope for. Will discuss further at his Ov next month. ----  I spoke with patient about results and he verbalized understanding and had no questions

## 2014-02-02 ENCOUNTER — Encounter: Payer: Self-pay | Admitting: *Deleted

## 2014-03-04 ENCOUNTER — Ambulatory Visit (INDEPENDENT_AMBULATORY_CARE_PROVIDER_SITE_OTHER): Payer: BC Managed Care – PPO | Admitting: Pulmonary Disease

## 2014-03-04 ENCOUNTER — Encounter: Payer: Self-pay | Admitting: Pulmonary Disease

## 2014-03-04 VITALS — BP 130/74 | HR 75 | Ht 63.5 in | Wt 176.0 lb

## 2014-03-04 DIAGNOSIS — J8410 Pulmonary fibrosis, unspecified: Secondary | ICD-10-CM

## 2014-03-04 NOTE — Assessment & Plan Note (Addendum)
VATS bx 2010:  UIP (sent to [facility] for review) Autoimmune 2010: ESR 43, DSDNA < 1, ANA neg, RF 24 (minimally elevated) PFT's 10/2009:  No obstruction, TLC 3.21 (61%), DLCO 9.3 (44%) PFT"s 11/2010:  No obstruction, TLC 3.36 (64%), DLCO 35% pred.  Pt chose watchful waiting for treatment.  PFT"s 2013:  No obstruction, TLC 2.65 (51%), DLCO 29% pred. CXR 2013:  Worsening ISLD. RF now higher than 2010 at 44. Echo 2013: nl LV fxn, impaired relaxation, moderate elevation RVSP estimated. Referred to Rheum for increased RF:  Not felt to have RA or other autoimmune disease.  Referred to pulmonary rehab 04/2012 PFT"s 12/2012:  FVC 1.92 (63%), No obstruction, TLC 2.91 (51%), DLCO 26%.   Autoimmune 2014:  RF 107!!, others negative. Prednisone started 04/2013:  ++response, but could not get prednisone below 79m/day (and pt has DM).  Imuran started 06/2013 TPMT:  Adequate metabolizer.  PFT's 01/2014:  FVC 1.77 (59%), TLC 2.90 (50%), DLCO 6.71 (28%)  The patient has known usual interstitial pneumonitis on thoracoscopic biopsy, and this is felt more likely due to to autoimmune disease than IPF. He has clearly been steroid responsive in the past, and has an abnormal rheumatoid factor that has increased significantly over time. He has been on Imuran and prednisone since January of this year, and his PFTs this summer were actually stable from last year.  He has noticed over the last 4 weeks a decline in his exertional tolerance, and his prednisone had been decreased from 20 mg a day to 17.5 mg at the last visit. There is nothing by his history to suggest an acute exacerbation, although he has had some increase in his oxygen requirement. I will increase his prednisone back to 20 mg a day, and we'll monitor him closely going forward.  I [facility] with [facility], and he is willing to do this.

## 2014-03-04 NOTE — Progress Notes (Signed)
   Subjective:    Patient ID: [name], male    DOB: 1950-07-31, 63 y.o.   MRN: [id number]  HPI The patient comes in today for followup of his known pulmonary fibrosis, possibly secondary to occult rheumatoid arthritis. He has been treated with moderate doses of prednisone as well as Imuran, and his chest x-ray and pulmonary function studies this summer were stable from one year ago. At the last visit, I asked him to decrease his prednisone to 17.5 mg, and he did well with this until about 4 weeks ago when he began to notice increasing dyspnea on exertion. It is not overly significant, but he has had to increase his exertional oxygen by 1 L per minute. He has a cough with minimally purulent mucus first thing in the morning, but then clears and has no issues the rest of the day.  He continues on Bactrim prophylaxis since he is on Imuran, and we have been monitoring his blood work consistently.   Review of Systems  Constitutional: Negative for fever and unexpected weight change.  HENT: Negative for congestion, dental problem, ear pain, nosebleeds, postnasal drip, rhinorrhea, sinus pressure, sneezing, sore throat and trouble swallowing.   Eyes: Negative for redness and itching.  Respiratory: Positive for cough, chest tightness and shortness of breath. Negative for wheezing.   Cardiovascular: Negative for palpitations and leg swelling.  Gastrointestinal: Negative for nausea and vomiting.  Genitourinary: Negative for dysuria.  Musculoskeletal: Negative for joint swelling.  Skin: Negative for rash.  Neurological: Negative for headaches.  Hematological: Does not bruise/bleed easily.  Psychiatric/Behavioral: Negative for dysphoric mood. The patient is not nervous/anxious.        Objective:   Physical Exam Well-developed male in no acute distress Nose without purulence or discharge noted Neck without lymphadenopathy or thyromegaly Chest with crackles three quarters of the way up  bilaterally, no wheezing Cardiac exam with regular rate and rhythm Lower extremities without edema, no cyanosis Alert and oriented, moves all 4 extremities.       Assessment & Plan:

## 2014-03-04 NOTE — Patient Instructions (Signed)
Increase prednisone to 20mg  a day for 2 weeks, then try to get back to 17.5mg  a day. Will send an order to your home care company to get you a new flow regulator if possible. Will need bloodwork next month. Will refer to [facility] for transplant evaluation. Think about going back to pulmonary rehab and let me know. followup with me again in 64mos.

## 2014-03-06 ENCOUNTER — Telehealth: Payer: Self-pay | Admitting: Pulmonary Disease

## 2014-03-06 MED ORDER — PREDNISONE 20 MG PO TABS: 20.0000 mg | ORAL_TABLET | Freq: Every day | ORAL | Status: DC

## 2014-03-06 NOTE — Telephone Encounter (Signed)
Spoke with pt and advised that rx for Prednisone 20mg  was sent to pharmacy.

## 2014-04-03 ENCOUNTER — Telehealth: Payer: Self-pay | Admitting: Pulmonary Disease

## 2014-04-03 NOTE — Telephone Encounter (Signed)
[id number] x1 

## 2014-04-04 NOTE — Telephone Encounter (Signed)
Pt wanted to let [facility] know that he has not had great improvement after completing therapy-prednisone. Pt aware that [facility] will address this once back in the office on Monday-no acute issues at the time of my call to patient.

## 2014-04-07 NOTE — Telephone Encounter (Signed)
Called and discussed treatment with him.  He is not having a decline in his breathing, but rather describes the usual up and down days often associated with lung disease.  He is willing to stay on his 17.5mg , and has an appt with [facility] transplant on 11/13 this month.  He will call if he has deterioration .

## 2014-04-07 NOTE — Telephone Encounter (Signed)
So is he saying increasing his prednisone to 20mg  a day did not have any difference??

## 2014-04-07 NOTE — Telephone Encounter (Signed)
Called pt. He reports he did try pred 20 mg x 2 weeks then go back 17.5 mg and did not see any difference. He went back to pred 17.5 now and was just letting us know that increase to 20 mg did not help. Please advise thanks

## 2014-04-18 DIAGNOSIS — Z7682 Awaiting organ transplant status: Secondary | ICD-10-CM | POA: Diagnosis not present

## 2014-04-18 DIAGNOSIS — J849 Interstitial pulmonary disease, unspecified: Secondary | ICD-10-CM | POA: Diagnosis not present

## 2014-04-18 DIAGNOSIS — K449 Diaphragmatic hernia without obstruction or gangrene: Secondary | ICD-10-CM | POA: Diagnosis not present

## 2014-04-18 DIAGNOSIS — R918 Other nonspecific abnormal finding of lung field: Secondary | ICD-10-CM | POA: Diagnosis not present

## 2014-04-18 DIAGNOSIS — E099 Drug or chemical induced diabetes mellitus without complications: Secondary | ICD-10-CM | POA: Diagnosis not present

## 2014-04-18 DIAGNOSIS — R911 Solitary pulmonary nodule: Secondary | ICD-10-CM | POA: Diagnosis not present

## 2014-04-18 DIAGNOSIS — R0600 Dyspnea, unspecified: Secondary | ICD-10-CM | POA: Diagnosis not present

## 2014-04-18 DIAGNOSIS — K219 Gastro-esophageal reflux disease without esophagitis: Secondary | ICD-10-CM | POA: Diagnosis not present

## 2014-04-18 DIAGNOSIS — Z01818 Encounter for other preprocedural examination: Secondary | ICD-10-CM | POA: Diagnosis not present

## 2014-04-18 LAB — PULMONARY FUNCTION TEST

## 2014-04-21 ENCOUNTER — Telehealth: Payer: Self-pay | Admitting: Pulmonary Disease

## 2014-04-21 NOTE — Telephone Encounter (Signed)
No message needed °

## 2014-04-22 ENCOUNTER — Ambulatory Visit (INDEPENDENT_AMBULATORY_CARE_PROVIDER_SITE_OTHER): Payer: Medicare Other | Admitting: Pulmonary Disease

## 2014-04-22 ENCOUNTER — Encounter: Payer: Self-pay | Admitting: Pulmonary Disease

## 2014-04-22 VITALS — BP 110/60 | HR 72 | Temp 97.0°F | Ht 63.5 in | Wt 178.0 lb

## 2014-04-22 DIAGNOSIS — J8410 Pulmonary fibrosis, unspecified: Secondary | ICD-10-CM

## 2014-04-22 DIAGNOSIS — J9611 Chronic respiratory failure with hypoxia: Secondary | ICD-10-CM | POA: Diagnosis not present

## 2014-04-22 NOTE — Patient Instructions (Addendum)
[facility] Continue on your current medications. Would like to see you back in 54mos, and can check bloodwork at that time.  Please call us if you hear from [facility].  We are usually the last to know.

## 2014-04-22 NOTE — Progress Notes (Signed)
   Subjective:    Patient ID: [name], male    DOB: 1950-11-26, 63 y.o.   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 He is currently on Imuran with moderate dose prednisone, and feels that he has been maintaining a stable baseline. He has been evaluated at [facility] transplant, and they have recommended enrollment in pulmonary rehabilitation and weight reduction. Further evaluation is pending review at their transplant conference. He was felt to have an acute bronchitis during their visit, and they treated him with antibiotics.   Review of Systems  Constitutional: Negative for fever and unexpected weight change.  HENT: Positive for congestion and postnasal drip. Negative for dental problem, ear pain, nosebleeds, rhinorrhea, sinus pressure, sneezing, sore throat and trouble swallowing.   Eyes: Negative for redness and itching.  Respiratory: Positive for shortness of breath. Negative for cough, chest tightness and wheezing.   Cardiovascular: Negative for palpitations and leg swelling.  Gastrointestinal: Negative for nausea and vomiting.  Genitourinary: Negative for dysuria.  Musculoskeletal: Negative for joint swelling.  Skin: Negative for rash.  Neurological: Negative for headaches.  Hematological: Does not bruise/bleed easily.  Psychiatric/Behavioral: Negative for dysphoric mood. The patient is not nervous/anxious.        Objective:   Physical Exam Overweight male in no acute distress Nose without purulence or discharge noted Neck without lymphadenopathy or thyromegaly Chest with crackles one half the way up bilaterally, no wheezing Cardiac exam with regular rate and rhythm Lower extremities with minimal edema, no cyanosis Alert and oriented, moves all 4 extremities.       Assessment &  Plan:

## 2014-04-22 NOTE — Assessment & Plan Note (Signed)
The patient continues on Imuran and moderate dose prednisone for his usual interstitial pneumonitis that is felt secondary to some type of autoimmune process rather than IPF. He has been referred to do for a transplant evaluation, and the initial visit has been completed. They have asked him to work on a 10 pound weight loss, and also to enroll in pulmonary rehabilitation. The patient feels that he is at a stable baseline currently, and therefore no changes to his regimen will be made. The goal will be to improve his conditioning and also get his weight down so that he will be a good candidate for possible transplant.

## 2014-04-24 DIAGNOSIS — H1013 Acute atopic conjunctivitis, bilateral: Secondary | ICD-10-CM | POA: Diagnosis not present

## 2014-04-24 DIAGNOSIS — H2513 Age-related nuclear cataract, bilateral: Secondary | ICD-10-CM | POA: Diagnosis not present

## 2014-04-24 DIAGNOSIS — E119 Type 2 diabetes mellitus without complications: Secondary | ICD-10-CM | POA: Diagnosis not present

## 2014-04-24 DIAGNOSIS — H25013 Cortical age-related cataract, bilateral: Secondary | ICD-10-CM | POA: Diagnosis not present

## 2014-04-25 ENCOUNTER — Telehealth (HOSPITAL_COMMUNITY): Payer: Self-pay

## 2014-04-25 NOTE — Telephone Encounter (Signed)
Called patient regarding entrance to Pulmonary Rehab.  Patient states that they are interested in attending the program.  [name] is going to verify insurance coverage and follow up.

## 2014-05-05 ENCOUNTER — Encounter: Payer: Self-pay | Admitting: Pulmonary Disease

## 2014-05-05 ENCOUNTER — Telehealth: Payer: Self-pay | Admitting: Pulmonary Disease

## 2014-05-05 ENCOUNTER — Encounter: Payer: Self-pay | Admitting: *Deleted

## 2014-05-05 NOTE — Telephone Encounter (Signed)
I spoke with the pt and he states that [employer] advised him that they need some information from [place] but I could not understand what was needed so I advised I will call [employer]. I called [facility] and was advised that they need oxygen saturation information. They state the records they have do not show this. The pt states that he had a 98mw at [facility] and this will have the info needed. I copied and pasted this from care everywhere and faxed it to [facility]. Nothing further needed. Lahaina Bing, CMA

## 2014-05-09 ENCOUNTER — Encounter (HOSPITAL_COMMUNITY): Payer: Self-pay

## 2014-05-09 ENCOUNTER — Encounter (HOSPITAL_COMMUNITY)
Admission: RE | Admit: 2014-05-09 | Discharge: 2014-05-09 | Disposition: A | Discharge status: Home / Self Care | Payer: Medicare Other | Source: Ambulatory Visit | Attending: Pulmonary Disease | Admitting: Pulmonary Disease

## 2014-05-09 VITALS — BP 106/68 | HR 81 | Resp 20 | Ht 64.0 in | Wt 176.6 lb

## 2014-05-09 DIAGNOSIS — J8410 Pulmonary fibrosis, unspecified: Secondary | ICD-10-CM | POA: Diagnosis not present

## 2014-05-09 DIAGNOSIS — E119 Type 2 diabetes mellitus without complications: Secondary | ICD-10-CM | POA: Diagnosis not present

## 2014-05-09 DIAGNOSIS — J849 Interstitial pulmonary disease, unspecified: Secondary | ICD-10-CM | POA: Insufficient documentation

## 2014-05-09 DIAGNOSIS — I10 Essential (primary) hypertension: Secondary | ICD-10-CM | POA: Insufficient documentation

## 2014-05-09 DIAGNOSIS — Z5189 Encounter for other specified aftercare: Secondary | ICD-10-CM | POA: Diagnosis not present

## 2014-05-09 DIAGNOSIS — R768 Other specified abnormal immunological findings in serum: Secondary | ICD-10-CM | POA: Insufficient documentation

## 2014-05-09 DIAGNOSIS — G4733 Obstructive sleep apnea (adult) (pediatric): Secondary | ICD-10-CM | POA: Insufficient documentation

## 2014-05-09 NOTE — Progress Notes (Signed)
[name] 63 y.o. male Pulmonary Rehab Orientation Note Patient arrived today in Cardiac and Pulmonary Rehab for orientation to Pulmonary Rehab. He was transported from [employer] via wheel chair. He does carry portable oxygen. Per pt, he uses oxygen continuously; 3 liters continuous at rest and 6 liters pulse with activity. Color good, skin warm and dry. Patient is oriented to time and place. Patient's medical history and medications reviewed. Heart rate is normal, breath sounds clear to auscultation upper lobes and course crackles in the bases. Grip strength equal, strong. Distal pulses palpable. Patient reports he does take medications as prescribed. Patient states he follows a Low Sodium, diabetic diet. The patient has been trying to loose weight through healthy dieting, but has instead gained a couple of pounds. He is being evaluated by [facility] for lung transplant and he weight has to be down to 165 to qualify. He was 178 today. He currently weights He states that he weighs himself daily and his weight gain has been gradual and nothing indicates signs of heart failure. Patient's weight will be monitored closely. Demonstration and practice of PLB using pulse oximeter. Patient able to return demonstration satisfactorily. Safety and hand hygiene in the exercise area reviewed with patient. Patient voices understanding of the information reviewed. Department expectations discussed with patient and achievable goals were set. The patient shows enthusiasm about attending the program and we look forward to working with this nice gentleman. The patient is scheduled for a 6 min walk test on Tuesday 12/8 t 3:30 and to begin exercise on Thursday 12/10 at 10:30.   45 minutes was spent on a variety of activities such as assessment of the patient, obtaining baseline data including height, weight, BMI, and grip strength, verifying medical history, allergies, and current medications, and teaching patient strategies  for performing tasks with less respiratory effort with emphasis on pursed lip breathing.

## 2014-05-13 ENCOUNTER — Encounter (HOSPITAL_COMMUNITY)
Admission: RE | Admit: 2014-05-13 | Discharge: 2014-05-13 | Disposition: A | Discharge status: Home / Self Care | Payer: Medicare Other | Source: Ambulatory Visit | Attending: Pulmonary Disease | Admitting: Pulmonary Disease

## 2014-05-13 ENCOUNTER — Telehealth: Payer: Self-pay | Admitting: Pulmonary Disease

## 2014-05-13 DIAGNOSIS — Z5189 Encounter for other specified aftercare: Secondary | ICD-10-CM | POA: Diagnosis not present

## 2014-05-13 DIAGNOSIS — E119 Type 2 diabetes mellitus without complications: Secondary | ICD-10-CM | POA: Diagnosis not present

## 2014-05-13 DIAGNOSIS — J9611 Chronic respiratory failure with hypoxia: Secondary | ICD-10-CM

## 2014-05-13 DIAGNOSIS — J8410 Pulmonary fibrosis, unspecified: Secondary | ICD-10-CM | POA: Diagnosis not present

## 2014-05-13 DIAGNOSIS — J849 Interstitial pulmonary disease, unspecified: Secondary | ICD-10-CM

## 2014-05-13 NOTE — Telephone Encounter (Signed)
Ok with me 

## 2014-05-13 NOTE — Telephone Encounter (Signed)
LM for Molly to return call. 

## 2014-05-13 NOTE — Progress Notes (Signed)
[name] completed a Six-Minute Walk Test on 05/13/14 . [name] walked 960 feet with 0 breaks.  The patient's lowest oxygen saturation was 86% , highest heart rate was 97 , and highest blood pressure was 140/64. The patient was on 8 liters of oxygen with a nasal cannula. [name] stated that nothing hindered his walk test.

## 2014-05-13 NOTE — Telephone Encounter (Signed)
Molly at Pulmonary Rehab called to let us know that patient was there for 6MW test and started out on 6L/M and they had to increase him to 8L/M due to drop in sats (went to 86%); pt c/o of irritation in nasal passages and Cloyde Reams is wondering if [facility] will give order and we provide oximizer pendant to help with this. We do have some here in office through [facility] if you would like patient to have one.   Molly requests we contact patient with response.   [place] please advise. Thanks.

## 2014-05-14 NOTE — Telephone Encounter (Signed)
Pt came by office and was shown how to wear pendant. Rx and signed form placed in [facility] folder. Nothing more needed at this time.

## 2014-05-14 NOTE — Telephone Encounter (Signed)
Spoke with patient-he is aware that [facility] is okay with him having an oximizer pendant; patient will come by this afternoon to pick one up and sign form for [facility]. Pt will ask for Triage so a nurse can sign papers with patient and show him how to use pendant.   Pendant and paper in triage with patients name on it.

## 2014-05-15 ENCOUNTER — Encounter (HOSPITAL_COMMUNITY)
Admission: RE | Admit: 2014-05-15 | Discharge: 2014-05-15 | Disposition: A | Discharge status: Home / Self Care | Payer: Medicare Other | Source: Ambulatory Visit | Attending: Pulmonary Disease | Admitting: Pulmonary Disease

## 2014-05-15 ENCOUNTER — Telehealth: Payer: Self-pay | Admitting: Pulmonary Disease

## 2014-05-15 DIAGNOSIS — J8410 Pulmonary fibrosis, unspecified: Secondary | ICD-10-CM | POA: Diagnosis not present

## 2014-05-15 DIAGNOSIS — E119 Type 2 diabetes mellitus without complications: Secondary | ICD-10-CM | POA: Diagnosis not present

## 2014-05-15 DIAGNOSIS — Z5189 Encounter for other specified aftercare: Secondary | ICD-10-CM | POA: Diagnosis not present

## 2014-05-15 NOTE — Progress Notes (Signed)
Today, Aviraj exercised at [employer]. Service time was from 10:30am to 12:20pm.  The patient exercised for more than 31 minutes performing aerobic, strengthening, and stretching exercises. Oxygen saturation, heart rate, blood pressure, rate of perceived exertion, and shortness of breath were all monitored before, during, and after exercise. Shadd presented with no problems at today's exercise session. The patient attended education with Jeanella Craze on Advanced Directives.  There was no workload change during today's exercise session.  Pre-exercise vitals: . Weight kg: 80.2 . Liters of O2: 8 . SpO2: 99 . HR: 75 . BP: 96/54 . CBG: 147  Exercise vitals: . Highest heartrate:  85 . Lowest oxygen saturation: 86% increased to 90% with pursed lip breathing instruction . Highest blood pressure: 138/80 . Liters of 02: 8  Post-exercise vitals: . SpO2: 96 . HR: 81 . BP: 126/60 . Liters of O2: 6 . CBG: 118  Dr. Brand Males, Medical Director Dr. Coralyn Pear is immediately available during today's Pulmonary Rehab session for CARDIN NITSCHKE on 05/15/14 at 10:30am class time.

## 2014-05-15 NOTE — Telephone Encounter (Signed)
Returning a call again [phone] [facility]

## 2014-05-15 NOTE — Telephone Encounter (Signed)
Called and spoke to [place]. Cloyde Reams stated the pt desat into 80's with 8lpm on 05/13/14, pt came and got oximizer on 05/14/14 from our office. Cloyde Reams stated the pt is doing better since having oximizer. Cloyde Reams stated they will work on pacing and pursed lip breathing to help keep pt's sats above 90%. Pt is to call if he needs another oximizer. Nothing further needed at this time.

## 2014-05-15 NOTE — Telephone Encounter (Signed)
LMTC x 1 for [facility]

## 2014-05-20 ENCOUNTER — Encounter (HOSPITAL_COMMUNITY)
Admission: RE | Admit: 2014-05-20 | Discharge: 2014-05-20 | Disposition: A | Discharge status: Home / Self Care | Payer: Medicare Other | Source: Ambulatory Visit | Attending: Pulmonary Disease | Admitting: Pulmonary Disease

## 2014-05-20 DIAGNOSIS — J8410 Pulmonary fibrosis, unspecified: Secondary | ICD-10-CM | POA: Diagnosis not present

## 2014-05-20 DIAGNOSIS — E119 Type 2 diabetes mellitus without complications: Secondary | ICD-10-CM | POA: Diagnosis not present

## 2014-05-20 DIAGNOSIS — Z5189 Encounter for other specified aftercare: Secondary | ICD-10-CM | POA: Diagnosis not present

## 2014-05-20 NOTE — Progress Notes (Signed)
Today, [name] exercised at [employer]. Service time was from 10:30am to 12:15pm.  The patient exercised for more than 31 minutes performing aerobic, strengthening, and stretching exercises. Oxygen saturation, heart rate, blood pressure, rate of perceived exertion, and shortness of breath were all monitored before, during, and after exercise. [name] presented with no problems at today's exercise session.   There was no workload change during today's exercise session.  Pre-exercise vitals: . Weight kg: 80.4 . Liters of O2: 8 . SpO2: 92 . HR: 84 . BP: 126/66 . CBG: 128  Exercise vitals: . Highest heartrate:  86 . Lowest oxygen saturation: 94 . Highest blood pressure: 120/70 . Liters of 02: 8  Post-exercise vitals: . SpO2: 99 . HR: 83 . BP: 100/62 . Liters of O2: 8 . CBG: 113  Dr. Brand Males, Medical Director Dr. Coralyn Pear is immediately available during today's Pulmonary Rehab session for [name] on 05/20/14 at 10:30am class time.

## 2014-05-22 ENCOUNTER — Encounter (HOSPITAL_COMMUNITY)
Admission: RE | Admit: 2014-05-22 | Discharge: 2014-05-22 | Disposition: A | Discharge status: Home / Self Care | Payer: Medicare Other | Source: Ambulatory Visit | Attending: Pulmonary Disease | Admitting: Pulmonary Disease

## 2014-05-22 NOTE — Progress Notes (Signed)
[name] did not exercise in pulmonary rehab today due to CBG of 71 on arrival to pulmonary rehab.  CBG increased to 83 15 minutes after drinking 12 ounces of ginger ale.  Then he was given another ginger ale 8 ounces and graham cracker and peanut butter snack.  His CBG increased to 117.  At this time he was allowed to go home.  Counselled on eating a higher protein breakfast before coming to exercise.  He voiced verbal understanding.  Upon leaving blood pressure 112/60, heart rate 73, spo2 96%.

## 2014-05-24 ENCOUNTER — Other Ambulatory Visit: Payer: Self-pay | Admitting: Pulmonary Disease

## 2014-05-26 ENCOUNTER — Telehealth: Payer: Self-pay | Admitting: Pulmonary Disease

## 2014-05-26 MED ORDER — SULFAMETHOXAZOLE-TRIMETHOPRIM 800-160 MG PO TABS: 1.0000 | ORAL_TABLET | Freq: Every day | ORAL | Status: DC

## 2014-05-26 NOTE — Telephone Encounter (Signed)
Spoke with the pt  Rx for bactrim was sent to pharm  Nothing further needed

## 2014-05-27 ENCOUNTER — Encounter (HOSPITAL_COMMUNITY)
Admission: RE | Admit: 2014-05-27 | Discharge: 2014-05-27 | Disposition: A | Discharge status: Home / Self Care | Payer: Medicare Other | Source: Ambulatory Visit | Attending: Pulmonary Disease | Admitting: Pulmonary Disease

## 2014-05-27 DIAGNOSIS — E119 Type 2 diabetes mellitus without complications: Secondary | ICD-10-CM | POA: Diagnosis not present

## 2014-05-27 DIAGNOSIS — J8410 Pulmonary fibrosis, unspecified: Secondary | ICD-10-CM | POA: Diagnosis not present

## 2014-05-27 DIAGNOSIS — Z5189 Encounter for other specified aftercare: Secondary | ICD-10-CM | POA: Diagnosis not present

## 2014-05-27 LAB — GLUCOSE, CAPILLARY
Glucose-Capillary: 185 mg/dL — ABNORMAL HIGH (ref 70–99)
Glucose-Capillary: 112 mg/dL — ABNORMAL HIGH (ref 70–99)

## 2014-05-27 NOTE — Progress Notes (Signed)
Today, [name] exercised at [employer]. Service time was from 1030 to 1200.  The patient exercised for more than 31 minutes performing aerobic, strengthening, and stretching exercises. Oxygen saturation, heart rate, blood pressure, rate of perceived exertion, and shortness of breath were all monitored before, during, and after exercise. [name] presented with no problems at today's exercise session.   There was no workload change during today's exercise session.  Pre-exercise vitals: . Weight kg: 81.7 . Liters of O2: 8 . SpO2: 93 . HR: 94 . BP: 114/66 . CBG: 185  Exercise vitals: . Highest heartrate:  96 . Lowest oxygen saturation: 88 which increased to 92 with purse lip breathing . Highest blood pressure: 130/74 . Liters of 02: 8  Post-exercise vitals: . SpO2: 96 . HR: 78 . BP: 110/56 . Liters of O2: 8 . CBG: 112 Dr. Brand Males, Medical Director Dr. Tana Coast is immediately available during today's Pulmonary Rehab session for [name] on 05/27/2014 at 1030 class time.  Marland Kitchen

## 2014-05-28 DIAGNOSIS — I10 Essential (primary) hypertension: Secondary | ICD-10-CM | POA: Diagnosis not present

## 2014-05-28 DIAGNOSIS — K219 Gastro-esophageal reflux disease without esophagitis: Secondary | ICD-10-CM | POA: Diagnosis not present

## 2014-05-28 DIAGNOSIS — J849 Interstitial pulmonary disease, unspecified: Secondary | ICD-10-CM | POA: Diagnosis not present

## 2014-05-28 DIAGNOSIS — E1122 Type 2 diabetes mellitus with diabetic chronic kidney disease: Secondary | ICD-10-CM | POA: Diagnosis not present

## 2014-05-28 DIAGNOSIS — Z7952 Long term (current) use of systemic steroids: Secondary | ICD-10-CM | POA: Diagnosis not present

## 2014-05-28 DIAGNOSIS — Z23 Encounter for immunization: Secondary | ICD-10-CM | POA: Diagnosis not present

## 2014-05-28 DIAGNOSIS — E782 Mixed hyperlipidemia: Secondary | ICD-10-CM | POA: Diagnosis not present

## 2014-05-29 ENCOUNTER — Encounter (HOSPITAL_COMMUNITY)
Admission: RE | Admit: 2014-05-29 | Discharge: 2014-05-29 | Disposition: A | Discharge status: Home / Self Care | Payer: Medicare Other | Source: Ambulatory Visit | Attending: Pulmonary Disease | Admitting: Pulmonary Disease

## 2014-05-29 DIAGNOSIS — J8410 Pulmonary fibrosis, unspecified: Secondary | ICD-10-CM | POA: Diagnosis not present

## 2014-05-29 DIAGNOSIS — Z5189 Encounter for other specified aftercare: Secondary | ICD-10-CM | POA: Diagnosis not present

## 2014-05-29 DIAGNOSIS — E119 Type 2 diabetes mellitus without complications: Secondary | ICD-10-CM | POA: Diagnosis not present

## 2014-05-29 NOTE — Progress Notes (Addendum)
Today, [name] exercised at [employer]. Service time was from 1030 to 1230.  The patient exercised for more than 31 minutes performing aerobic, strengthening, and stretching exercises. Oxygen saturation, heart rate, blood pressure, rate of perceived exertion, and shortness of breath were all monitored before, during, and after exercise. [name] presented with no problems at today's exercise session, however his CBG was 77 upon discharge. He was given gingerale and peanut butter crackers and had a recheck of 77. He was then given tang and after 15 min his CBG increased to 82. Clint remained asymptomatic. He and his wife were going to eat lunch immediately post discharge.  There was a workload change during today's exercise session.  Pre-exercise vitals: . Weight kg: 80.6 . Liters of O2: 8L . SpO2: 94 . HR: 81 . BP: 114/60 . CBG: 134  Exercise vitals: . Highest heartrate:  90 . Lowest oxygen saturation: 86 increased to 91 with rest break and PLB . Highest blood pressure: 126/74 . Liters of 02: 8L  Post-exercise vitals: . SpO2: 95 . HR: 80 . BP: 120/64 . Liters of O2: 8L . CBG: 77 see note above  Dr. Brand Males, Medical Director Dr. Candiss Norse is immediately available during today's Pulmonary session for [name] on 05/29/14 at 1030 am class time.

## 2014-06-03 ENCOUNTER — Encounter (HOSPITAL_COMMUNITY)
Admission: RE | Admit: 2014-06-03 | Discharge: 2014-06-03 | Disposition: A | Discharge status: Home / Self Care | Payer: Medicare Other | Source: Ambulatory Visit | Attending: Pulmonary Disease | Admitting: Pulmonary Disease

## 2014-06-03 ENCOUNTER — Ambulatory Visit: Payer: BC Managed Care – PPO | Admitting: Pulmonary Disease

## 2014-06-03 DIAGNOSIS — Z5189 Encounter for other specified aftercare: Secondary | ICD-10-CM | POA: Diagnosis not present

## 2014-06-03 DIAGNOSIS — J8410 Pulmonary fibrosis, unspecified: Secondary | ICD-10-CM | POA: Diagnosis not present

## 2014-06-03 DIAGNOSIS — E119 Type 2 diabetes mellitus without complications: Secondary | ICD-10-CM | POA: Diagnosis not present

## 2014-06-03 NOTE — Progress Notes (Signed)
Today, [name] exercised at [employer]. Service time was from 1030 to 1150.  The patient exercised for more than 31 minutes performing aerobic, strengthening, and stretching exercises. Oxygen saturation, heart rate, blood pressure, rate of perceived exertion, and shortness of breath were all monitored before, during, and after exercise. [name] presented with no problems at today's exercise session.   There was one workload change during today's exercise session.  Pre-exercise vitals: . Weight kg: 78.9 . Liters of O2: 8 . SpO2: 98 . HR: 88 . BP: 132/62 . CBG: 247  Exercise vitals: . Highest heartrate:  96 . Lowest oxygen saturation: 91 . Highest blood pressure: 120/58 . Liters of 02: 8  Post-exercise vitals: . SpO2: 93 . HR: 90 . BP: 124/62 . Liters of O2: 8 . CBG: 131 Dr. Brand Males, Medical Director Dr. Candiss Norse is immediately available during today's Pulmonary Rehab session for [name] on 06/03/2014 at 1030 class time.  Marland Kitchen

## 2014-06-05 ENCOUNTER — Encounter (HOSPITAL_COMMUNITY)
Admission: RE | Admit: 2014-06-05 | Discharge: 2014-06-05 | Disposition: A | Discharge status: Home / Self Care | Payer: Medicare Other | Source: Ambulatory Visit | Attending: Pulmonary Disease | Admitting: Pulmonary Disease

## 2014-06-05 DIAGNOSIS — Z5189 Encounter for other specified aftercare: Secondary | ICD-10-CM | POA: Diagnosis not present

## 2014-06-05 DIAGNOSIS — J8410 Pulmonary fibrosis, unspecified: Secondary | ICD-10-CM | POA: Diagnosis not present

## 2014-06-05 DIAGNOSIS — E119 Type 2 diabetes mellitus without complications: Secondary | ICD-10-CM | POA: Diagnosis not present

## 2014-06-05 NOTE — Progress Notes (Signed)
Today, [name] exercised at [name]. Service time was from 1030 to 1230.  The patient exercised for more than 31 minutes performing aerobic, strengthening, and stretching exercises. Oxygen saturation, heart rate, blood pressure, rate of perceived exertion, and shortness of breath were all monitored before, during, and after exercise. [name] presented with no problems at today's exercise session. [name] also attended an education session on warning s/s of pulmonary and cardiac illnesses.  There was no workload change during today's exercise session.  Pre-exercise vitals: . Weight kg: 79.7 . Liters of O2: 8L . SpO2: 100 . HR: 80 . BP: 106/58 . CBG: 143  Exercise vitals: . Highest heartrate:  110 . Lowest oxygen saturation: 93 . Highest blood pressure: 100/50 . Liters of 02: 8L  Post-exercise vitals: . SpO2: 97 . HR: 68 . BP: 108/60 . Liters of O2: 8L . CBG: 117  Dr. Brand Males, Medical Director Dr. Candiss Norse is immediately available during today's Pulmonary Rehab session for [name] on 06/05/2014 at 1030 class time.

## 2014-06-09 DIAGNOSIS — Z01810 Encounter for preprocedural cardiovascular examination: Secondary | ICD-10-CM | POA: Diagnosis not present

## 2014-06-09 DIAGNOSIS — I700 Atherosclerosis of aorta: Secondary | ICD-10-CM | POA: Diagnosis not present

## 2014-06-09 DIAGNOSIS — J432 Centrilobular emphysema: Secondary | ICD-10-CM | POA: Diagnosis not present

## 2014-06-09 DIAGNOSIS — E119 Type 2 diabetes mellitus without complications: Secondary | ICD-10-CM | POA: Diagnosis not present

## 2014-06-09 DIAGNOSIS — I509 Heart failure, unspecified: Secondary | ICD-10-CM | POA: Diagnosis not present

## 2014-06-09 DIAGNOSIS — Z7682 Awaiting organ transplant status: Secondary | ICD-10-CM | POA: Diagnosis not present

## 2014-06-10 ENCOUNTER — Encounter (HOSPITAL_COMMUNITY): Payer: Medicare Other

## 2014-06-12 ENCOUNTER — Encounter (HOSPITAL_COMMUNITY): Payer: Medicare Other

## 2014-06-12 DIAGNOSIS — Z7682 Awaiting organ transplant status: Secondary | ICD-10-CM | POA: Diagnosis not present

## 2014-06-17 ENCOUNTER — Encounter (HOSPITAL_COMMUNITY)
Admission: RE | Admit: 2014-06-17 | Discharge: 2014-06-17 | Disposition: A | Discharge status: Home / Self Care | Payer: Medicare Other | Source: Ambulatory Visit | Attending: Pulmonary Disease | Admitting: Pulmonary Disease

## 2014-06-17 DIAGNOSIS — J8410 Pulmonary fibrosis, unspecified: Secondary | ICD-10-CM | POA: Insufficient documentation

## 2014-06-17 DIAGNOSIS — Z5189 Encounter for other specified aftercare: Secondary | ICD-10-CM | POA: Insufficient documentation

## 2014-06-17 DIAGNOSIS — E119 Type 2 diabetes mellitus without complications: Secondary | ICD-10-CM | POA: Insufficient documentation

## 2014-06-17 NOTE — Progress Notes (Signed)
Today, [name] exercised at [employer]. Service time was from 1030 to 1155.  The patient exercised for more than 31 minutes performing aerobic, strengthening, and stretching exercises. Oxygen saturation, heart rate, blood pressure, rate of perceived exertion, and shortness of breath were all monitored before, during, and after exercise. [name] presented with no problems at today's exercise session.   There was one workload change during today's exercise session.  Pre-exercise vitals: . Weight kg: 79.4 . Liters of O2: 8 . SpO2: 99 . HR: 79 . BP: 132/74 . CBG: 224  Exercise vitals: . Highest heartrate:  89 . Lowest oxygen saturation: 90 . Highest blood pressure: 100/60 . Liters of 02: 8  Post-exercise vitals: . SpO2: 98 . HR: 74 . BP: 104/72 . Liters of O2: 8 . CBG: 117 Dr. Brand Males, Medical Director Dr. Coralyn Pear is immediately available during today's Pulmonary Rehab session for [name] on 06/17/2014 at 1030 class time.  Marland Kitchen

## 2014-06-19 ENCOUNTER — Encounter (HOSPITAL_COMMUNITY)
Admission: RE | Admit: 2014-06-19 | Discharge: 2014-06-19 | Disposition: A | Discharge status: Home / Self Care | Payer: Medicare Other | Source: Ambulatory Visit | Attending: Pulmonary Disease | Admitting: Pulmonary Disease

## 2014-06-19 DIAGNOSIS — Z5189 Encounter for other specified aftercare: Secondary | ICD-10-CM | POA: Diagnosis not present

## 2014-06-19 NOTE — Progress Notes (Signed)
Today, [name] exercised at [employer]. Service time was from 11:00am to 1:00pm.  The patient exercised for more than 31 minutes performing aerobic, strengthening, and stretching exercises. Oxygen saturation, heart rate, blood pressure, rate of perceived exertion, and shortness of breath were all monitored before, during, and after exercise. [name] presented with no problems at today's exercise session. The patient attended education class today with Dr. Nelda Marseille.  There was no workload change during today's exercise session.  Pre-exercise vitals: . Weight kg: 80.0 . Liters of O2: 8 . SpO2: 99 . HR: 82 . BP: 126/62 . CBG: 250  Exercise vitals: . Highest heartrate:  104 . Lowest oxygen saturation: 88% . Highest blood pressure: 104/60 . Liters of 02: 8  Post-exercise vitals: . SpO2: 96 . HR: 75 . BP: 126/82 . Liters of O2: 8 . CBG: 131  Dr. Brand Males, Medical Director Dr. Algis Liming is immediately available during today's Pulmonary Rehab session for FINTAN GRATER on 06/19/14 at 10:30am class time.

## 2014-06-24 ENCOUNTER — Telehealth: Payer: Self-pay | Admitting: Pulmonary Disease

## 2014-06-24 ENCOUNTER — Encounter (HOSPITAL_COMMUNITY)
Admission: RE | Admit: 2014-06-24 | Discharge: 2014-06-24 | Disposition: A | Discharge status: Home / Self Care | Payer: Medicare Other | Source: Ambulatory Visit | Attending: Pulmonary Disease | Admitting: Pulmonary Disease

## 2014-06-24 DIAGNOSIS — J8410 Pulmonary fibrosis, unspecified: Secondary | ICD-10-CM

## 2014-06-24 DIAGNOSIS — J9611 Chronic respiratory failure with hypoxia: Secondary | ICD-10-CM

## 2014-06-24 DIAGNOSIS — Z5189 Encounter for other specified aftercare: Secondary | ICD-10-CM | POA: Diagnosis not present

## 2014-06-24 DIAGNOSIS — Z7952 Long term (current) use of systemic steroids: Secondary | ICD-10-CM | POA: Diagnosis not present

## 2014-06-24 NOTE — Telephone Encounter (Signed)
pulm rehab now closed. WCB in AM

## 2014-06-24 NOTE — Progress Notes (Signed)
I have reviewed a Home Exercise Prescription with [name] . [name] is currently exercising by utilizing his resistance bands and performing his stretches at home.  The patient was advised to walk 3 days a week for 30-45 minutes.  Nox and I discussed how to progress their exercise prescription.  The patient stated that their goals were to get a lunch transplant, watch his grandson graduate from high school, and be able to sing without getting so short of breath.  The patient stated that they understand the exercise prescription.  We reviewed exercise guidelines, target heart rate during exercise, oxygen use, weather, home pulse oximeter, endpoints for exercise, and goals.  Patient is encouraged to come to me with any questions. I will continue to follow up with the patient to assist them with progression and safety.

## 2014-06-24 NOTE — Progress Notes (Signed)
Today, [name] exercised at [employer]. Service time was from 1030 to 1140.  The patient exercised for more than 31 minutes performing aerobic, strengthening, and stretching exercises. Oxygen saturation, heart rate, blood pressure, rate of perceived exertion, and shortness of breath were all monitored before, during, and after exercise. [name] presented with no problems at today's exercise session.   There was no workload change during today's exercise session.  Pre-exercise vitals: . Weight kg: 79.8 . Liters of O2: 8 . SpO2: 99 . HR: 81 . BP: 106/60 . CBG: 267  Exercise vitals: . Highest heartrate:  99 . Lowest oxygen saturation: 84 . Highest blood pressure: 122/70 . Liters of 02: 10  Post-exercise vitals: . SpO2: 100 . HR: 91 . BP: 122/76 . Liters of O2: 8 . CBG: 197 Dr. Brand Males, Medical Director Dr. Algis Liming is immediately available during today's Pulmonary Rehab session for RAYMOND BHARDWAJ on 06/24/2014 at 1030 class time.  Marland Kitchen

## 2014-06-25 NOTE — Telephone Encounter (Signed)
Called to speak to [facility] and was told that she does not work on Wednesdays. Will have to call back tomorrow.

## 2014-06-26 ENCOUNTER — Encounter (HOSPITAL_COMMUNITY)
Admission: RE | Admit: 2014-06-26 | Discharge: 2014-06-26 | Disposition: A | Discharge status: Home / Self Care | Payer: Medicare Other | Source: Ambulatory Visit | Attending: Pulmonary Disease | Admitting: Pulmonary Disease

## 2014-06-26 DIAGNOSIS — Z5189 Encounter for other specified aftercare: Secondary | ICD-10-CM | POA: Diagnosis not present

## 2014-06-26 NOTE — Progress Notes (Signed)
Today, [name] exercised at [employer] [facility]. Service time was from 10:30am to 12:30pm.  The patient exercised for more than 31 minutes performing aerobic, strengthening, and stretching exercises. Oxygen saturation, heart rate, blood pressure, rate of perceived exertion, and shortness of breath were all monitored before, during, and after exercise. Clee presented with no problems at today's exercise session. The patient attended education today with Trish Fountain on "Oxygen Use and Safety."  There was no workload change during today's exercise session.  Pre-exercise vitals: . Weight kg: 79.9 . Liters of O2: 10 . SpO2: 98 . HR: 79 . BP: 116/52 . CBG: 287  Exercise vitals: . Highest heartrate:  99 . Lowest oxygen saturation: 90 . Highest blood pressure: 118/64 . Liters of 02: 10  Post-exercise vitals: . SpO2: 100 . HR: 79 . BP: 112/60 . Liters of O2: 8 . CBG: 163  Dr. Brand Males, Medical Director Dr. Daleen Bo is immediately available during today's Pulmonary Rehab session for [name] on 06/26/14 at 10:30am class time.

## 2014-06-26 NOTE — Telephone Encounter (Signed)
Spoke with [name] at [employer].  Pt has been approved for transplant at [facility] but they want him to lose 20 lbs before putting him on the list.  Pt has comes to rehab 2 x  A week and sats are dropping to 84% on 8L while walking the track.  They bumped him to 10L with a pendant and sats came up to 86% while walking.  The portable tank pt uses at home only goes up to 6L.  If he needs to walk to lose the weight he needs a portable tank at home that will go up to 10L continuous and also the pendant is uncomfortable and they thought a mustache may work better for him.  Please advise if ok to order through DME

## 2014-06-30 ENCOUNTER — Telehealth: Payer: Self-pay | Admitting: Pulmonary Disease

## 2014-06-30 DIAGNOSIS — J8410 Pulmonary fibrosis, unspecified: Secondary | ICD-10-CM

## 2014-06-30 NOTE — Telephone Encounter (Signed)
Spoke with Arbie Cookey at [facility] to make her aware.  Will put order in for "mustache" oximizer.  She states that she will explain the portable 02 dilemma to the patient.  Also notes that with the "mustache" oximizer, sometimes the liter flow is brought down, which might make a difference in the patient's portable 02 use.  The respiratory therapists will test patient with the oximizer at patient's home.  Placing order for oximizer.  Nothing further needed at this time.

## 2014-06-30 NOTE — Telephone Encounter (Signed)
I see no reason in doing that.  I think we have to make due the best we can with the tank he currently has.  Explain the situation to him.  We can order the oximizer that goes under his nose rather than a pendant if he thinks will be more comfortable.

## 2014-06-30 NOTE — Telephone Encounter (Signed)
All of that is ok, although they may not have a portable tank that goes to 10 liters.

## 2014-06-30 NOTE — Telephone Encounter (Signed)
[name] is aware and order placed. Nothing further needed

## 2014-06-30 NOTE — Telephone Encounter (Signed)
Spoke with Arbie Cookey at [facility], states that they can accomodate portable 02 order for pt but it would have to be an eTank that could accomodate up to 15 lpm and this would only last him 10-20 minutes.  Pt also states that his "pendant is uncomfortable", but Arbie Cookey states that they do not provide patient with oximizer and wants to make sure that he receives that through [place].    Dr. Gwenette Greet please advise.  Thank you!

## 2014-07-01 ENCOUNTER — Encounter (HOSPITAL_COMMUNITY)
Admission: RE | Admit: 2014-07-01 | Discharge: 2014-07-01 | Disposition: A | Discharge status: Home / Self Care | Payer: Medicare Other | Source: Ambulatory Visit | Attending: Pulmonary Disease | Admitting: Pulmonary Disease

## 2014-07-01 DIAGNOSIS — Z5189 Encounter for other specified aftercare: Secondary | ICD-10-CM | POA: Diagnosis not present

## 2014-07-01 NOTE — Progress Notes (Signed)
Today, [name] exercised at [employer]. Service time was from 10:30am to 12:00pm.  The patient exercised for more than 31 minutes performing aerobic, strengthening, and stretching exercises. Oxygen saturation, heart rate, blood pressure, rate of perceived exertion, and shortness of breath were all monitored before, during, and after exercise. [name] presented with no problems at today's exercise session.   There was no workload change during today's exercise session.  Pre-exercise vitals: . Weight kg: 78.8 . Liters of O2: 10 . SpO2: 100 . HR: 88 . BP: 104/60 . CBG: 200  Exercise vitals: . Highest heartrate:  104 . Lowest oxygen saturation: 85% . Highest blood pressure: 110/60 . Liters of 02: 10  Post-exercise vitals: . SpO2: 100 . HR: 86 . BP: 126/68 . Liters of O2: 10 . CBG: 157  Dr. Brand Males, Medical Director Dr. Tana Coast is immediately available during today's Pulmonary Rehab session for [name] on 07/01/14 at 10:30a class time.

## 2014-07-03 ENCOUNTER — Encounter (HOSPITAL_COMMUNITY)
Admission: RE | Admit: 2014-07-03 | Discharge: 2014-07-03 | Disposition: A | Discharge status: Home / Self Care | Payer: Medicare Other | Source: Ambulatory Visit | Attending: Pulmonary Disease | Admitting: Pulmonary Disease

## 2014-07-03 DIAGNOSIS — Z5189 Encounter for other specified aftercare: Secondary | ICD-10-CM | POA: Diagnosis not present

## 2014-07-03 NOTE — Progress Notes (Signed)
Today, Steel exercised at [employer]. Service time was from 1030 to 1230.  The patient exercised for more than 31 minutes performing aerobic, strengthening, and stretching exercises. Oxygen saturation, heart rate, blood pressure, rate of perceived exertion, and shortness of breath were all monitored before, during, and after exercise. [name] presented with no problems at today's exercise session. Patient attended the advance directives class today.    There was no workload change during today's exercise session.  Pre-exercise vitals: . Weight kg: 79.6 . Liters of O2: 10 . SpO2: 99 . HR: 88 . BP: 106/64 . CBG: 215  Exercise vitals: . Highest heartrate:  92 . Lowest oxygen saturation: 91 . Highest blood pressure: 114/64 . Liters of 02: 10  Post-exercise vitals: . SpO2: 100 . HR: 79 . BP: 102/54 . Liters of O2: 10 . CBG: 169 Dr. Brand Males, Medical Director Dr. Tana Coast is immediately available during today's Pulmonary Rehab session for [name] on 07/03/2014 at 1030 class time.  Marland Kitchen

## 2014-07-04 DIAGNOSIS — Z23 Encounter for immunization: Secondary | ICD-10-CM | POA: Diagnosis not present

## 2014-07-08 ENCOUNTER — Encounter (HOSPITAL_COMMUNITY)
Admission: RE | Admit: 2014-07-08 | Discharge: 2014-07-08 | Disposition: A | Discharge status: Home / Self Care | Payer: Medicare Other | Source: Ambulatory Visit | Attending: Pulmonary Disease | Admitting: Pulmonary Disease

## 2014-07-08 DIAGNOSIS — E119 Type 2 diabetes mellitus without complications: Secondary | ICD-10-CM | POA: Insufficient documentation

## 2014-07-08 DIAGNOSIS — Z5189 Encounter for other specified aftercare: Secondary | ICD-10-CM | POA: Diagnosis not present

## 2014-07-08 DIAGNOSIS — J8410 Pulmonary fibrosis, unspecified: Secondary | ICD-10-CM | POA: Insufficient documentation

## 2014-07-08 NOTE — Progress Notes (Signed)
Today, [name] exercised at [employer]. Service time was from 1030 to 1200.  The patient exercised for more than 31 minutes performing aerobic, strengthening, and stretching exercises. Oxygen saturation, heart rate, blood pressure, rate of perceived exertion, and shortness of breath were all monitored before, during, and after exercise. [name] presented with no problems at today's exercise session.   There was no workload change during today's exercise session.  Pre-exercise vitals: . Weight kg: 78.8 . Liters of O2: 10L . SpO2: 99 . HR: 90 . BP: 110/64 . CBG: 271  Exercise vitals: . Highest heartrate:  105 . Lowest oxygen saturation: 85 increased to 90 with PLB . Highest blood pressure: 130/62 . Liters of 02: 15L  Post-exercise vitals: . SpO2: 100 . HR: 90 . BP: 90/58, 88/60 recheck, 110/63 with Gatorade  . Liters of O2: 10L . CBG: 230  Dr. Brand Males, Medical Director Dr. Tana Coast is immediately available during today's Pulmonary Rehab session for [name] on 07/08/2014 at 1330 class time.

## 2014-07-10 ENCOUNTER — Encounter (HOSPITAL_COMMUNITY)
Admission: RE | Admit: 2014-07-10 | Discharge: 2014-07-10 | Disposition: A | Discharge status: Home / Self Care | Payer: Medicare Other | Source: Ambulatory Visit | Attending: Pulmonary Disease | Admitting: Pulmonary Disease

## 2014-07-10 DIAGNOSIS — J8410 Pulmonary fibrosis, unspecified: Secondary | ICD-10-CM | POA: Diagnosis not present

## 2014-07-10 DIAGNOSIS — E119 Type 2 diabetes mellitus without complications: Secondary | ICD-10-CM | POA: Diagnosis not present

## 2014-07-10 DIAGNOSIS — Z5189 Encounter for other specified aftercare: Secondary | ICD-10-CM | POA: Diagnosis not present

## 2014-07-10 NOTE — Progress Notes (Signed)
Today, [name] exercised at [employer]. Service time was from 1030 to 1215no.  The patient exercised for more than 31 minutes performing aerobic, strengthening, and stretching exercises. Oxygen saturation, heart rate, blood pressure, rate of perceived exertion, and shortness of breath were all monitored before, during, and after exercise. [name] presented with no problems at today's exercise session. Patient attended the respiratory therapy class today.  There was no workload change during today's exercise session.  Pre-exercise vitals: . Weight kg: 80.2 . Liters of O2: 10 . SpO2: 95 . HR: 79 . BP: 104/56 . CBG: 217  Exercise vitals: . Highest heartrate:  114/64 . Lowest oxygen saturation: 85 which increased to 92 after resting 2-3 minutes. . Highest blood pressure: 114/64 . Liters of 02: 10  Post-exercise vitals: . SpO2: 100 . HR: 76 . BP: 110/70 . Liters of O2: 10 . CBG: 117 Dr. Brand Males, Medical Director Dr. Wyline Copas is immediately available during today's Pulmonary Rehab session for [name] on 07/10/2014 at 1030 class time.  Marland Kitchen

## 2014-07-14 ENCOUNTER — Telehealth: Payer: Self-pay | Admitting: Pulmonary Disease

## 2014-07-14 NOTE — Telephone Encounter (Signed)
LMTCB

## 2014-07-14 NOTE — Telephone Encounter (Signed)
Pt is needing a different meds [phone]

## 2014-07-14 NOTE — Telephone Encounter (Signed)
Called and spoke to pt. Pt stated the imuran is not covered by insurance. Pt stated he would call insurance for covered alternatives and call back tomorrow morning, 07/15/14.

## 2014-07-15 ENCOUNTER — Encounter (HOSPITAL_COMMUNITY)
Admission: RE | Admit: 2014-07-15 | Discharge: 2014-07-15 | Disposition: A | Discharge status: Home / Self Care | Payer: Medicare Other | Source: Ambulatory Visit | Attending: Pulmonary Disease | Admitting: Pulmonary Disease

## 2014-07-15 DIAGNOSIS — Z5189 Encounter for other specified aftercare: Secondary | ICD-10-CM | POA: Diagnosis not present

## 2014-07-15 DIAGNOSIS — E119 Type 2 diabetes mellitus without complications: Secondary | ICD-10-CM | POA: Diagnosis not present

## 2014-07-15 DIAGNOSIS — J8410 Pulmonary fibrosis, unspecified: Secondary | ICD-10-CM | POA: Diagnosis not present

## 2014-07-15 NOTE — Telephone Encounter (Signed)
[employer] needs dx for the imuran  [phone] [facility]

## 2014-07-15 NOTE — Progress Notes (Signed)
Today, [name] exercised at [employer]. Service time was from 10:30a to 12:10p.  The patient exercised for more than 31 minutes performing aerobic, strengthening, and stretching exercises. Oxygen saturation, heart rate, blood pressure, rate of perceived exertion, and shortness of breath were all monitored before, during, and after exercise. [name] presented with no problems at today's exercise session.   There was no workload change during today's exercise session.  Pre-exercise vitals: . Weight kg: 80.7 . Liters of O2: 10 . SpO2: 97 . HR: 90 . BP: 110/60 . CBG: 287  Exercise vitals: . Highest heartrate:  99 . Lowest oxygen saturation: 85% . Highest blood pressure: 112/66 . Liters of 02: 10  Post-exercise vitals: . SpO2: 98 . HR: 81 . BP: 108/72 . Liters of O2: 10 . CBG: 251  Dr. Brand Males, Medical Director Dr. Wyline Copas is immediately available during today's Pulmonary Rehab session for [name] on 07/15/14 at 10:30am class time.

## 2014-07-15 NOTE — Telephone Encounter (Signed)
Spoke with Colletta [place] to give the ICD-10 code for pt's imuran.

## 2014-07-17 ENCOUNTER — Encounter (HOSPITAL_COMMUNITY)
Admission: RE | Admit: 2014-07-17 | Discharge: 2014-07-17 | Disposition: A | Discharge status: Home / Self Care | Payer: Medicare Other | Source: Ambulatory Visit | Attending: Pulmonary Disease | Admitting: Pulmonary Disease

## 2014-07-17 DIAGNOSIS — E119 Type 2 diabetes mellitus without complications: Secondary | ICD-10-CM | POA: Diagnosis not present

## 2014-07-17 DIAGNOSIS — Z5189 Encounter for other specified aftercare: Secondary | ICD-10-CM | POA: Diagnosis not present

## 2014-07-17 DIAGNOSIS — J8410 Pulmonary fibrosis, unspecified: Secondary | ICD-10-CM | POA: Diagnosis not present

## 2014-07-17 NOTE — Progress Notes (Signed)
Today, [name] exercised at [employer]. Service time was from 1030 to 1230.  The patient exercised for more than 31 minutes performing aerobic, strengthening, and stretching exercises. Oxygen saturation, heart rate, blood pressure, rate of perceived exertion, and shortness of breath were all monitored before, during, and after exercise. [name] presented with no problems at today's exercise session. Patient attended the [facility] inservice today.  There was one workload change during today's exercise session.  Pre-exercise vitals: . Weight kg: 80.4 . Liters of O2: 6 . SpO2: 99 . HR: 85 . BP: 108/60 . CBG: 229  Exercise vitals: . Highest heartrate:  99 . Lowest oxygen saturation: 84 . Highest blood pressure: 120/80 . Liters of 02: 10  Post-exercise vitals: . SpO2: 100 . HR: 89 . BP: 120/74 . Liters of O2: 10 . CBG: 179 Dr. Brand Males, Medical Director Dr. Candiss Norse is immediately available during today's Pulmonary Rehab session for [name] on 07/17/2014 at 1030 class time.  Marland Kitchen

## 2014-07-22 ENCOUNTER — Ambulatory Visit (INDEPENDENT_AMBULATORY_CARE_PROVIDER_SITE_OTHER): Payer: Medicare Other | Admitting: Pulmonary Disease

## 2014-07-22 ENCOUNTER — Encounter: Payer: Self-pay | Admitting: Pulmonary Disease

## 2014-07-22 ENCOUNTER — Encounter (HOSPITAL_COMMUNITY)
Admission: RE | Admit: 2014-07-22 | Discharge: 2014-07-22 | Disposition: A | Discharge status: Home / Self Care | Payer: Medicare Other | Source: Ambulatory Visit | Attending: Pulmonary Disease | Admitting: Pulmonary Disease

## 2014-07-22 VITALS — BP 126/78 | HR 76 | Temp 97.0°F | Ht 63.5 in | Wt 174.8 lb

## 2014-07-22 DIAGNOSIS — Z5189 Encounter for other specified aftercare: Secondary | ICD-10-CM | POA: Diagnosis not present

## 2014-07-22 DIAGNOSIS — J9611 Chronic respiratory failure with hypoxia: Secondary | ICD-10-CM | POA: Diagnosis not present

## 2014-07-22 DIAGNOSIS — E119 Type 2 diabetes mellitus without complications: Secondary | ICD-10-CM | POA: Diagnosis not present

## 2014-07-22 DIAGNOSIS — J8410 Pulmonary fibrosis, unspecified: Secondary | ICD-10-CM | POA: Diagnosis not present

## 2014-07-22 NOTE — Patient Instructions (Signed)
Continue with rehab program thru [facility] No change in medications for now followup with me in 27mos.

## 2014-07-22 NOTE — Assessment & Plan Note (Signed)
The patient continues to stay at his usual baseline with both good and bad days. He is still working very hard in pulmonary rehabilitation, and apparently has completed his transplant evaluation. He has an appointment with [facility] today, and is enrolling in their rehabilitation program. I have asked him to continue on his current medications since he seems to be maintaining a reasonable baseline. Hopefully, he will be able to get his transplant soon.

## 2014-07-22 NOTE — Progress Notes (Signed)
[name] completed a Six-Minute Walk Test on 07/22/14 . [name] walked 891 feet with 0 breaks.  The patient's lowest oxygen saturation was 87% , highest heart rate was 93 , and highest blood pressure was 160/80. The patient was on 10 liters of oxygen with a nasal cannula. [name] stated that nothing hindered their walk test.

## 2014-07-22 NOTE — Progress Notes (Signed)
   Subjective:    Patient ID: [name], male    DOB: 30-Sep-1950, 64 y.o.   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 He has maintained on Imuran with low-dose prednisone in light of his very elevated rheumatoid factor that has increased over the years. He appears to have responded to this with a period of stability from a symptom standpoint, although he has continued to have progressive disease by PFTs. He is still participating in pulmonary rehabilitation, and has done well. He is using high flow oxygen with his exercise program, and requiring 3-5 L at rest and sleep. He has minimal cough primarily in the morning, but no chest congestion. He does not feel that he has a chest cold at this time. He has both good and bad days as expected. He has an appointment today with [facility], and tells me that he has completed the transplant evaluation with a large number of studies. They are asking him to enroll in the rehabilitation program at [facility], and the patient is planning on doing this.   Review of Systems  Constitutional: Negative for fever and unexpected weight change.  HENT: Negative for congestion, dental problem, ear pain, nosebleeds, postnasal drip, rhinorrhea, sinus pressure, sneezing, sore throat and trouble swallowing.   Eyes: Negative for redness and itching.  Respiratory: Positive for cough and shortness of breath. Negative for chest tightness and wheezing.   Cardiovascular: Negative for palpitations and leg swelling.  Gastrointestinal: Negative for nausea and vomiting.  Genitourinary: Negative for dysuria.  Musculoskeletal: Negative for joint swelling.  Skin: Negative for rash.  Neurological: Negative for headaches.  Hematological: Does not bruise/bleed easily.  Psychiatric/Behavioral: Negative for dysphoric mood. The patient is not nervous/anxious.          Objective:   Physical Exam Well-developed male in no acute distress Nose without purulence or discharge noted Neck without lymphadenopathy or thyromegaly Chest with crackles half the way up bilaterally, no wheezing Cardiac exam with regular rate and rhythm Lower extremities without edema, no cyanosis Alert and oriented, moves all 4 extremities.       Assessment & Plan:

## 2014-07-24 ENCOUNTER — Encounter (HOSPITAL_COMMUNITY): Admission: RE | Admit: 2014-07-24 | Payer: Medicare Other | Source: Ambulatory Visit

## 2014-07-29 ENCOUNTER — Encounter (HOSPITAL_COMMUNITY): Payer: Medicare Other

## 2014-07-29 NOTE — Progress Notes (Signed)
Pulmonary Rehabilitation Discharge Note: [name] has been discharged from pulmonary rehab after successfully completing 18 exercise/education sessions. [name] had to be discharged from the program early because of his relocation to the [facility] pulmonary rehab program. [name] is pursuing a lung transplant and is required to attend the transplanting centers rehab. This was one of [name]'s personal goals that he successfully met while here in pulmonary rehab. [name] increased his stamina and strength as evidenced by his tolerance of work load increases, however he was unable to increase his footage walk on a 6 min walk test. He actually decreased his footage walked by 69 feet as compared to his entry walk test. [name] oxygen had to be increased from 8 liters to 10 liters while exercising and over the last week in the program, he began to desaturate to the low 80s with walking.

## 2014-07-31 ENCOUNTER — Encounter (HOSPITAL_COMMUNITY): Payer: Medicare Other

## 2014-08-05 ENCOUNTER — Encounter (HOSPITAL_COMMUNITY): Payer: Medicare Other

## 2014-08-05 DIAGNOSIS — J84112 Idiopathic pulmonary fibrosis: Secondary | ICD-10-CM | POA: Diagnosis not present

## 2014-08-05 DIAGNOSIS — Z7682 Awaiting organ transplant status: Secondary | ICD-10-CM | POA: Diagnosis not present

## 2014-08-05 DIAGNOSIS — J849 Interstitial pulmonary disease, unspecified: Secondary | ICD-10-CM | POA: Diagnosis not present

## 2014-08-07 ENCOUNTER — Encounter (HOSPITAL_COMMUNITY): Payer: Medicare Other

## 2014-08-07 DIAGNOSIS — Z7682 Awaiting organ transplant status: Secondary | ICD-10-CM | POA: Diagnosis not present

## 2014-08-07 DIAGNOSIS — R262 Difficulty in walking, not elsewhere classified: Secondary | ICD-10-CM | POA: Diagnosis not present

## 2014-08-12 ENCOUNTER — Telehealth: Payer: Self-pay | Admitting: Pulmonary Disease

## 2014-08-12 ENCOUNTER — Encounter (HOSPITAL_COMMUNITY): Payer: Medicare Other

## 2014-08-12 DIAGNOSIS — E1165 Type 2 diabetes mellitus with hyperglycemia: Secondary | ICD-10-CM | POA: Diagnosis not present

## 2014-08-12 DIAGNOSIS — G4733 Obstructive sleep apnea (adult) (pediatric): Secondary | ICD-10-CM

## 2014-08-12 NOTE — Telephone Encounter (Signed)
Called and spoke with pt and he stated that [facility] stated that we need to send order over for the pt to continue to get his cpap supplies through them.  They need this documented per medicare guidelines.  [place] please advise. thanks

## 2014-08-12 NOTE — Telephone Encounter (Signed)
Ok with me 

## 2014-08-13 NOTE — Telephone Encounter (Signed)
Order has been placed. Pt has been made aware by message. Advised him to call if he had any questions.

## 2014-08-14 ENCOUNTER — Encounter (HOSPITAL_COMMUNITY): Payer: Medicare Other

## 2014-08-19 ENCOUNTER — Encounter (HOSPITAL_COMMUNITY): Payer: Medicare Other

## 2014-08-21 ENCOUNTER — Encounter (HOSPITAL_COMMUNITY): Payer: Medicare Other

## 2014-08-25 ENCOUNTER — Encounter: Payer: Self-pay | Admitting: Critical Care Medicine

## 2014-08-26 ENCOUNTER — Encounter (HOSPITAL_COMMUNITY): Payer: Medicare Other

## 2014-09-08 DIAGNOSIS — R262 Difficulty in walking, not elsewhere classified: Secondary | ICD-10-CM | POA: Diagnosis not present

## 2014-09-09 DIAGNOSIS — R262 Difficulty in walking, not elsewhere classified: Secondary | ICD-10-CM | POA: Diagnosis not present

## 2014-09-10 DIAGNOSIS — R262 Difficulty in walking, not elsewhere classified: Secondary | ICD-10-CM | POA: Diagnosis not present

## 2014-09-11 DIAGNOSIS — R262 Difficulty in walking, not elsewhere classified: Secondary | ICD-10-CM | POA: Diagnosis not present

## 2014-09-15 DIAGNOSIS — J849 Interstitial pulmonary disease, unspecified: Secondary | ICD-10-CM | POA: Diagnosis not present

## 2014-09-15 DIAGNOSIS — Z7682 Awaiting organ transplant status: Secondary | ICD-10-CM | POA: Diagnosis not present

## 2014-09-16 DIAGNOSIS — R262 Difficulty in walking, not elsewhere classified: Secondary | ICD-10-CM | POA: Diagnosis not present

## 2014-09-17 DIAGNOSIS — R262 Difficulty in walking, not elsewhere classified: Secondary | ICD-10-CM | POA: Diagnosis not present

## 2014-09-22 DIAGNOSIS — R262 Difficulty in walking, not elsewhere classified: Secondary | ICD-10-CM | POA: Diagnosis not present

## 2014-09-23 DIAGNOSIS — R262 Difficulty in walking, not elsewhere classified: Secondary | ICD-10-CM | POA: Diagnosis not present

## 2014-09-24 DIAGNOSIS — R262 Difficulty in walking, not elsewhere classified: Secondary | ICD-10-CM | POA: Diagnosis not present

## 2014-09-25 DIAGNOSIS — R262 Difficulty in walking, not elsewhere classified: Secondary | ICD-10-CM | POA: Diagnosis not present

## 2014-09-26 DIAGNOSIS — R262 Difficulty in walking, not elsewhere classified: Secondary | ICD-10-CM | POA: Diagnosis not present

## 2014-09-29 DIAGNOSIS — R262 Difficulty in walking, not elsewhere classified: Secondary | ICD-10-CM | POA: Diagnosis not present

## 2014-09-30 DIAGNOSIS — Z7952 Long term (current) use of systemic steroids: Secondary | ICD-10-CM | POA: Diagnosis not present

## 2014-09-30 DIAGNOSIS — R262 Difficulty in walking, not elsewhere classified: Secondary | ICD-10-CM | POA: Diagnosis not present

## 2014-09-30 DIAGNOSIS — E1165 Type 2 diabetes mellitus with hyperglycemia: Secondary | ICD-10-CM | POA: Diagnosis not present

## 2014-10-01 DIAGNOSIS — R911 Solitary pulmonary nodule: Secondary | ICD-10-CM | POA: Diagnosis not present

## 2014-10-01 DIAGNOSIS — Z7682 Awaiting organ transplant status: Secondary | ICD-10-CM | POA: Diagnosis not present

## 2014-10-01 DIAGNOSIS — J849 Interstitial pulmonary disease, unspecified: Secondary | ICD-10-CM | POA: Diagnosis not present

## 2014-10-01 DIAGNOSIS — Z008 Encounter for other general examination: Secondary | ICD-10-CM | POA: Diagnosis not present

## 2014-10-02 DIAGNOSIS — R262 Difficulty in walking, not elsewhere classified: Secondary | ICD-10-CM | POA: Diagnosis not present

## 2014-10-03 DIAGNOSIS — R262 Difficulty in walking, not elsewhere classified: Secondary | ICD-10-CM | POA: Diagnosis not present

## 2014-10-06 DIAGNOSIS — Z1389 Encounter for screening for other disorder: Secondary | ICD-10-CM | POA: Diagnosis not present

## 2014-10-06 DIAGNOSIS — E1122 Type 2 diabetes mellitus with diabetic chronic kidney disease: Secondary | ICD-10-CM | POA: Diagnosis not present

## 2014-10-06 DIAGNOSIS — I5032 Chronic diastolic (congestive) heart failure: Secondary | ICD-10-CM | POA: Diagnosis not present

## 2014-10-06 DIAGNOSIS — I739 Peripheral vascular disease, unspecified: Secondary | ICD-10-CM | POA: Diagnosis not present

## 2014-10-06 DIAGNOSIS — G4733 Obstructive sleep apnea (adult) (pediatric): Secondary | ICD-10-CM | POA: Diagnosis not present

## 2014-10-06 DIAGNOSIS — J9611 Chronic respiratory failure with hypoxia: Secondary | ICD-10-CM | POA: Diagnosis not present

## 2014-10-06 DIAGNOSIS — R262 Difficulty in walking, not elsewhere classified: Secondary | ICD-10-CM | POA: Diagnosis not present

## 2014-10-06 DIAGNOSIS — I270 Primary pulmonary hypertension: Secondary | ICD-10-CM | POA: Diagnosis not present

## 2014-10-06 DIAGNOSIS — J849 Interstitial pulmonary disease, unspecified: Secondary | ICD-10-CM | POA: Diagnosis not present

## 2014-10-06 DIAGNOSIS — Z0000 Encounter for general adult medical examination without abnormal findings: Secondary | ICD-10-CM | POA: Diagnosis not present

## 2014-10-06 DIAGNOSIS — E782 Mixed hyperlipidemia: Secondary | ICD-10-CM | POA: Diagnosis not present

## 2014-10-06 DIAGNOSIS — I10 Essential (primary) hypertension: Secondary | ICD-10-CM | POA: Diagnosis not present

## 2014-10-07 DIAGNOSIS — R262 Difficulty in walking, not elsewhere classified: Secondary | ICD-10-CM | POA: Diagnosis not present

## 2014-10-08 DIAGNOSIS — R262 Difficulty in walking, not elsewhere classified: Secondary | ICD-10-CM | POA: Diagnosis not present

## 2014-10-09 DIAGNOSIS — R262 Difficulty in walking, not elsewhere classified: Secondary | ICD-10-CM | POA: Diagnosis not present

## 2014-10-10 DIAGNOSIS — R262 Difficulty in walking, not elsewhere classified: Secondary | ICD-10-CM | POA: Diagnosis not present

## 2014-10-13 DIAGNOSIS — R262 Difficulty in walking, not elsewhere classified: Secondary | ICD-10-CM | POA: Diagnosis not present

## 2014-10-14 DIAGNOSIS — R262 Difficulty in walking, not elsewhere classified: Secondary | ICD-10-CM | POA: Diagnosis not present

## 2014-10-15 ENCOUNTER — Telehealth: Payer: Self-pay | Admitting: Pulmonary Disease

## 2014-10-15 DIAGNOSIS — R262 Difficulty in walking, not elsewhere classified: Secondary | ICD-10-CM | POA: Diagnosis not present

## 2014-10-15 NOTE — Telephone Encounter (Signed)
[name], please address this asap and tell them to get the pt his supplies.  This is ridiculous.  There are many notes over the last one year that do address his sleep apnea and should be more than enough.  If they aren't willing to help a very sick pt, will get him to someone who will.

## 2014-10-15 NOTE — Telephone Encounter (Signed)
DME [facility].- They have sent a cpap supplies request 3x and they have not heard anything. He needs this expedited.  [phone]

## 2014-10-15 NOTE — Telephone Encounter (Signed)
lmtcb for pt. Do not see where anyone attempted to call him.  Perhaps calling to confirm 5/16 appt with [facility]?

## 2014-10-15 NOTE — Telephone Encounter (Signed)
Called and spoke to pt. Pt stated he is still waiting on CPAP supplies since 07/2014. Called [employer] and spoke to a rep that stated pt will need the most recent face to face addressing pt is compliant with his CPAP. Last OV with [place] was on 07/22/14 but pt's OSA was not addressed. Pt has an upcoming appt on 5/16 for 3 month ROV.   Will forward to [facility] as FYI.

## 2014-10-16 DIAGNOSIS — R262 Difficulty in walking, not elsewhere classified: Secondary | ICD-10-CM | POA: Diagnosis not present

## 2014-10-16 NOTE — Telephone Encounter (Signed)
[place], has this been taken care of?  (SEE KC's note)

## 2014-10-16 NOTE — Telephone Encounter (Signed)
Spoke to [email or web address]  and faxed over last ov noter pt aware and will be getting cpap supplies soon Joellen [place]

## 2014-10-17 DIAGNOSIS — R262 Difficulty in walking, not elsewhere classified: Secondary | ICD-10-CM | POA: Diagnosis not present

## 2014-10-20 ENCOUNTER — Encounter: Payer: Self-pay | Admitting: Pulmonary Disease

## 2014-10-20 ENCOUNTER — Ambulatory Visit (INDEPENDENT_AMBULATORY_CARE_PROVIDER_SITE_OTHER): Payer: Medicare Other | Admitting: Pulmonary Disease

## 2014-10-20 VITALS — BP 118/70 | HR 67 | Temp 97.8°F | Ht 63.5 in | Wt 171.6 lb

## 2014-10-20 DIAGNOSIS — G4733 Obstructive sleep apnea (adult) (pediatric): Secondary | ICD-10-CM | POA: Diagnosis not present

## 2014-10-20 DIAGNOSIS — J8410 Pulmonary fibrosis, unspecified: Secondary | ICD-10-CM | POA: Diagnosis not present

## 2014-10-20 NOTE — Assessment & Plan Note (Signed)
Continues on cpap, and is doing well. He reports no issues with his mask fit or pressure, but is overdue for new supplies.

## 2014-10-20 NOTE — Progress Notes (Signed)
   Subjective:    Patient ID: [name], male    DOB: 08-29-1950, 64 y.o.   MRN: [id number]  HPI The patient comes in today for follow-up of his known interstitial lung disease with chronic respiratory failure. This was initially felt to be idiopathic pulmonary fibrosis, and then he was noted to be extremely sensitive to prednisone with a rapidly escalating rheumatoid factor. He has been on Imuran and prednisone along with PCP prophylaxis, and this seems to have resulted in stability. He has been working aggressively in pulmonary rehabilitation, and is in the transplant program at [facility]. He apparently is getting very close to being listed for transplantation. He also has a history of obstructive sleep apnea for which he is staying on C Pap compliantly. He is overdue for supplies.   Review of Systems  Constitutional: Negative for fever and unexpected weight change.  HENT: Positive for postnasal drip. Negative for congestion, dental problem, ear pain, nosebleeds, rhinorrhea, sinus pressure, sneezing, sore throat and trouble swallowing.   Eyes: Negative for redness and itching.  Respiratory: Positive for shortness of breath. Negative for cough, chest tightness and wheezing.   Cardiovascular: Positive for leg swelling. Negative for palpitations.  Gastrointestinal: Negative for nausea and vomiting.  Genitourinary: Negative for dysuria.  Musculoskeletal: Negative for joint swelling.  Skin: Negative for rash.  Neurological: Negative for headaches.  Hematological: Does not bruise/bleed easily.  Psychiatric/Behavioral: Negative for dysphoric mood. The patient is not nervous/anxious.        Objective:   Physical Exam Well-developed male in no acute distress Nose without purulence or discharge noted Neck without lymphadenopathy or thyromegaly Chest with crackles half the way up bilaterally, no wheezing Cardiac exam with regular rate and rhythm Lower extremities without significant edema,  no cyanosis Alert and oriented, moves all 4 extremities.       Assessment & Plan:

## 2014-10-20 NOTE — Patient Instructions (Signed)
Will resolve the issue with your cpap supplies No change in medications Keep up with your bloodwork at [facility]. followup with Dr. Lake Bells in 49mo, but call if you are having worsening symptoms.

## 2014-10-20 NOTE — Assessment & Plan Note (Signed)
The patient is maintaining stability from the last visit, and continues with his pulmonary rehabilitation and ongoing transplant evaluation. He apparently is very close to being accepted. I have asked him to continue on his Imuran and prednisone with his PCP prophylaxis. He is having frequent blood work through [employer], and this will serve to monitor for toxicity of his Imuran.

## 2014-10-21 DIAGNOSIS — R262 Difficulty in walking, not elsewhere classified: Secondary | ICD-10-CM | POA: Diagnosis not present

## 2014-10-22 DIAGNOSIS — R262 Difficulty in walking, not elsewhere classified: Secondary | ICD-10-CM | POA: Diagnosis not present

## 2014-10-23 DIAGNOSIS — Z7682 Awaiting organ transplant status: Secondary | ICD-10-CM | POA: Diagnosis not present

## 2014-10-23 DIAGNOSIS — R262 Difficulty in walking, not elsewhere classified: Secondary | ICD-10-CM | POA: Diagnosis not present

## 2014-10-24 DIAGNOSIS — R262 Difficulty in walking, not elsewhere classified: Secondary | ICD-10-CM | POA: Diagnosis not present

## 2014-10-27 ENCOUNTER — Telehealth: Payer: Self-pay | Admitting: Pulmonary Disease

## 2014-10-28 NOTE — Telephone Encounter (Signed)
Dr. Gwenette Greet, please advise if you are okay with refilling pt prednisone? thanks

## 2014-10-29 DIAGNOSIS — J8489 Other specified interstitial pulmonary diseases: Secondary | ICD-10-CM | POA: Diagnosis not present

## 2014-10-29 DIAGNOSIS — R740 Nonspecific elevation of levels of transaminase and lactic acid dehydrogenase [LDH]: Secondary | ICD-10-CM | POA: Diagnosis not present

## 2014-10-29 DIAGNOSIS — K567 Ileus, unspecified: Secondary | ICD-10-CM | POA: Diagnosis not present

## 2014-10-29 DIAGNOSIS — R34 Anuria and oliguria: Secondary | ICD-10-CM | POA: Diagnosis not present

## 2014-10-29 DIAGNOSIS — K317 Polyp of stomach and duodenum: Secondary | ICD-10-CM | POA: Diagnosis not present

## 2014-10-29 DIAGNOSIS — Z942 Lung transplant status: Secondary | ICD-10-CM | POA: Diagnosis not present

## 2014-10-29 DIAGNOSIS — Z9281 Personal history of extracorporeal membrane oxygenation (ECMO): Secondary | ICD-10-CM | POA: Diagnosis not present

## 2014-10-29 DIAGNOSIS — J948 Other specified pleural conditions: Secondary | ICD-10-CM | POA: Diagnosis not present

## 2014-10-29 DIAGNOSIS — R42 Dizziness and giddiness: Secondary | ICD-10-CM | POA: Diagnosis not present

## 2014-10-29 DIAGNOSIS — Z4824 Encounter for aftercare following lung transplant: Secondary | ICD-10-CM | POA: Diagnosis not present

## 2014-10-29 DIAGNOSIS — Z452 Encounter for adjustment and management of vascular access device: Secondary | ICD-10-CM | POA: Diagnosis not present

## 2014-10-29 DIAGNOSIS — J984 Other disorders of lung: Secondary | ICD-10-CM | POA: Diagnosis not present

## 2014-10-29 DIAGNOSIS — R253 Fasciculation: Secondary | ICD-10-CM | POA: Diagnosis not present

## 2014-10-29 DIAGNOSIS — N170 Acute kidney failure with tubular necrosis: Secondary | ICD-10-CM | POA: Diagnosis not present

## 2014-10-29 DIAGNOSIS — N186 End stage renal disease: Secondary | ICD-10-CM | POA: Diagnosis not present

## 2014-10-29 DIAGNOSIS — G4733 Obstructive sleep apnea (adult) (pediatric): Secondary | ICD-10-CM | POA: Diagnosis present

## 2014-10-29 DIAGNOSIS — Z4682 Encounter for fitting and adjustment of non-vascular catheter: Secondary | ICD-10-CM | POA: Diagnosis not present

## 2014-10-29 DIAGNOSIS — E1165 Type 2 diabetes mellitus with hyperglycemia: Secondary | ICD-10-CM | POA: Diagnosis not present

## 2014-10-29 DIAGNOSIS — J95830 Postprocedural hemorrhage and hematoma of a respiratory system organ or structure following a respiratory system procedure: Secondary | ICD-10-CM | POA: Diagnosis not present

## 2014-10-29 DIAGNOSIS — R509 Fever, unspecified: Secondary | ICD-10-CM | POA: Diagnosis not present

## 2014-10-29 DIAGNOSIS — Z48298 Encounter for aftercare following other organ transplant: Secondary | ICD-10-CM | POA: Diagnosis not present

## 2014-10-29 DIAGNOSIS — J810 Acute pulmonary edema: Secondary | ICD-10-CM | POA: Diagnosis not present

## 2014-10-29 DIAGNOSIS — R262 Difficulty in walking, not elsewhere classified: Secondary | ICD-10-CM | POA: Diagnosis not present

## 2014-10-29 DIAGNOSIS — K264 Chronic or unspecified duodenal ulcer with hemorrhage: Secondary | ICD-10-CM | POA: Diagnosis not present

## 2014-10-29 DIAGNOSIS — G252 Other specified forms of tremor: Secondary | ICD-10-CM | POA: Diagnosis not present

## 2014-10-29 DIAGNOSIS — J9620 Acute and chronic respiratory failure, unspecified whether with hypoxia or hypercapnia: Secondary | ICD-10-CM | POA: Diagnosis not present

## 2014-10-29 DIAGNOSIS — I4891 Unspecified atrial fibrillation: Secondary | ICD-10-CM | POA: Diagnosis present

## 2014-10-29 DIAGNOSIS — I82C11 Acute embolism and thrombosis of right internal jugular vein: Secondary | ICD-10-CM | POA: Diagnosis not present

## 2014-10-29 DIAGNOSIS — Z789 Other specified health status: Secondary | ICD-10-CM | POA: Diagnosis not present

## 2014-10-29 DIAGNOSIS — F329 Major depressive disorder, single episode, unspecified: Secondary | ICD-10-CM | POA: Diagnosis present

## 2014-10-29 DIAGNOSIS — K922 Gastrointestinal hemorrhage, unspecified: Secondary | ICD-10-CM | POA: Diagnosis not present

## 2014-10-29 DIAGNOSIS — R600 Localized edema: Secondary | ICD-10-CM | POA: Diagnosis not present

## 2014-10-29 DIAGNOSIS — R2990 Unspecified symptoms and signs involving the nervous system: Secondary | ICD-10-CM | POA: Diagnosis not present

## 2014-10-29 DIAGNOSIS — Z9911 Dependence on respirator [ventilator] status: Secondary | ICD-10-CM | POA: Diagnosis not present

## 2014-10-29 DIAGNOSIS — T814XXA Infection following a procedure, initial encounter: Secondary | ICD-10-CM | POA: Diagnosis not present

## 2014-10-29 DIAGNOSIS — K921 Melena: Secondary | ICD-10-CM | POA: Diagnosis not present

## 2014-10-29 DIAGNOSIS — J849 Interstitial pulmonary disease, unspecified: Secondary | ICD-10-CM | POA: Diagnosis not present

## 2014-10-29 DIAGNOSIS — J9622 Acute and chronic respiratory failure with hypercapnia: Secondary | ICD-10-CM | POA: Diagnosis not present

## 2014-10-29 DIAGNOSIS — J189 Pneumonia, unspecified organism: Secondary | ICD-10-CM | POA: Diagnosis not present

## 2014-10-29 DIAGNOSIS — D899 Disorder involving the immune mechanism, unspecified: Secondary | ICD-10-CM | POA: Diagnosis not present

## 2014-10-29 DIAGNOSIS — J9601 Acute respiratory failure with hypoxia: Secondary | ICD-10-CM | POA: Diagnosis not present

## 2014-10-29 DIAGNOSIS — R404 Transient alteration of awareness: Secondary | ICD-10-CM | POA: Diagnosis not present

## 2014-10-29 DIAGNOSIS — T86818 Other complications of lung transplant: Secondary | ICD-10-CM | POA: Diagnosis not present

## 2014-10-29 DIAGNOSIS — J939 Pneumothorax, unspecified: Secondary | ICD-10-CM | POA: Diagnosis not present

## 2014-10-29 DIAGNOSIS — K269 Duodenal ulcer, unspecified as acute or chronic, without hemorrhage or perforation: Secondary | ICD-10-CM | POA: Diagnosis not present

## 2014-10-29 DIAGNOSIS — R109 Unspecified abdominal pain: Secondary | ICD-10-CM | POA: Diagnosis not present

## 2014-10-29 DIAGNOSIS — A491 Streptococcal infection, unspecified site: Secondary | ICD-10-CM | POA: Diagnosis not present

## 2014-10-29 DIAGNOSIS — R55 Syncope and collapse: Secondary | ICD-10-CM | POA: Diagnosis not present

## 2014-10-29 DIAGNOSIS — R569 Unspecified convulsions: Secondary | ICD-10-CM | POA: Diagnosis not present

## 2014-10-29 DIAGNOSIS — D62 Acute posthemorrhagic anemia: Secondary | ICD-10-CM | POA: Diagnosis not present

## 2014-10-29 DIAGNOSIS — I120 Hypertensive chronic kidney disease with stage 5 chronic kidney disease or end stage renal disease: Secondary | ICD-10-CM | POA: Diagnosis not present

## 2014-10-29 DIAGNOSIS — F4323 Adjustment disorder with mixed anxiety and depressed mood: Secondary | ICD-10-CM | POA: Diagnosis not present

## 2014-10-29 DIAGNOSIS — Z119 Encounter for screening for infectious and parasitic diseases, unspecified: Secondary | ICD-10-CM | POA: Diagnosis not present

## 2014-10-29 DIAGNOSIS — Z794 Long term (current) use of insulin: Secondary | ICD-10-CM | POA: Diagnosis not present

## 2014-10-29 DIAGNOSIS — Z87891 Personal history of nicotine dependence: Secondary | ICD-10-CM | POA: Diagnosis not present

## 2014-10-29 DIAGNOSIS — T86810 Lung transplant rejection: Secondary | ICD-10-CM | POA: Diagnosis not present

## 2014-10-29 DIAGNOSIS — T86812 Lung transplant infection: Secondary | ICD-10-CM | POA: Diagnosis not present

## 2014-10-29 DIAGNOSIS — R918 Other nonspecific abnormal finding of lung field: Secondary | ICD-10-CM | POA: Diagnosis not present

## 2014-10-29 DIAGNOSIS — R0902 Hypoxemia: Secondary | ICD-10-CM | POA: Diagnosis not present

## 2014-10-29 DIAGNOSIS — J9612 Chronic respiratory failure with hypercapnia: Secondary | ICD-10-CM | POA: Diagnosis not present

## 2014-10-29 DIAGNOSIS — R739 Hyperglycemia, unspecified: Secondary | ICD-10-CM | POA: Diagnosis not present

## 2014-10-29 DIAGNOSIS — R609 Edema, unspecified: Secondary | ICD-10-CM | POA: Diagnosis not present

## 2014-10-29 DIAGNOSIS — I951 Orthostatic hypotension: Secondary | ICD-10-CM | POA: Diagnosis not present

## 2014-10-29 DIAGNOSIS — J9611 Chronic respiratory failure with hypoxia: Secondary | ICD-10-CM | POA: Diagnosis not present

## 2014-10-29 DIAGNOSIS — R491 Aphonia: Secondary | ICD-10-CM | POA: Diagnosis not present

## 2014-10-29 DIAGNOSIS — J95821 Acute postprocedural respiratory failure: Secondary | ICD-10-CM | POA: Diagnosis not present

## 2014-10-29 DIAGNOSIS — T86819 Unspecified complication of lung transplant: Secondary | ICD-10-CM | POA: Diagnosis not present

## 2014-10-29 DIAGNOSIS — K3189 Other diseases of stomach and duodenum: Secondary | ICD-10-CM | POA: Diagnosis not present

## 2014-10-29 DIAGNOSIS — Z5181 Encounter for therapeutic drug level monitoring: Secondary | ICD-10-CM | POA: Diagnosis not present

## 2014-10-29 DIAGNOSIS — Z23 Encounter for immunization: Secondary | ICD-10-CM | POA: Diagnosis not present

## 2014-10-29 DIAGNOSIS — Z931 Gastrostomy status: Secondary | ICD-10-CM | POA: Diagnosis not present

## 2014-10-29 DIAGNOSIS — Z79899 Other long term (current) drug therapy: Secondary | ICD-10-CM | POA: Diagnosis not present

## 2014-10-29 DIAGNOSIS — R58 Hemorrhage, not elsewhere classified: Secondary | ICD-10-CM | POA: Diagnosis not present

## 2014-10-29 DIAGNOSIS — Z4901 Encounter for fitting and adjustment of extracorporeal dialysis catheter: Secondary | ICD-10-CM | POA: Diagnosis not present

## 2014-10-29 DIAGNOSIS — K219 Gastro-esophageal reflux disease without esophagitis: Secondary | ICD-10-CM | POA: Diagnosis present

## 2014-10-29 DIAGNOSIS — G8918 Other acute postprocedural pain: Secondary | ICD-10-CM | POA: Diagnosis not present

## 2014-10-29 DIAGNOSIS — R1312 Dysphagia, oropharyngeal phase: Secondary | ICD-10-CM | POA: Diagnosis not present

## 2014-10-29 DIAGNOSIS — I2510 Atherosclerotic heart disease of native coronary artery without angina pectoris: Secondary | ICD-10-CM | POA: Diagnosis not present

## 2014-10-29 DIAGNOSIS — N183 Chronic kidney disease, stage 3 (moderate): Secondary | ICD-10-CM | POA: Diagnosis not present

## 2014-10-29 DIAGNOSIS — T86811 Lung transplant failure: Secondary | ICD-10-CM | POA: Diagnosis not present

## 2014-10-29 DIAGNOSIS — I82621 Acute embolism and thrombosis of deep veins of right upper extremity: Secondary | ICD-10-CM | POA: Diagnosis not present

## 2014-10-29 DIAGNOSIS — Z792 Long term (current) use of antibiotics: Secondary | ICD-10-CM | POA: Diagnosis not present

## 2014-10-29 DIAGNOSIS — T797XXA Traumatic subcutaneous emphysema, initial encounter: Secondary | ICD-10-CM | POA: Diagnosis not present

## 2014-10-29 DIAGNOSIS — K5730 Diverticulosis of large intestine without perforation or abscess without bleeding: Secondary | ICD-10-CM | POA: Diagnosis not present

## 2014-10-29 DIAGNOSIS — R938 Abnormal findings on diagnostic imaging of other specified body structures: Secondary | ICD-10-CM | POA: Diagnosis not present

## 2014-10-29 DIAGNOSIS — S20211D Contusion of right front wall of thorax, subsequent encounter: Secondary | ICD-10-CM | POA: Diagnosis not present

## 2014-10-29 DIAGNOSIS — J982 Interstitial emphysema: Secondary | ICD-10-CM | POA: Diagnosis not present

## 2014-10-29 DIAGNOSIS — K6389 Other specified diseases of intestine: Secondary | ICD-10-CM | POA: Diagnosis not present

## 2014-10-29 DIAGNOSIS — J9610 Chronic respiratory failure, unspecified whether with hypoxia or hypercapnia: Secondary | ICD-10-CM | POA: Diagnosis present

## 2014-10-29 DIAGNOSIS — L03313 Cellulitis of chest wall: Secondary | ICD-10-CM | POA: Diagnosis not present

## 2014-10-29 DIAGNOSIS — T183XXS Foreign body in small intestine, sequela: Secondary | ICD-10-CM | POA: Diagnosis not present

## 2014-10-29 DIAGNOSIS — E46 Unspecified protein-calorie malnutrition: Secondary | ICD-10-CM | POA: Diagnosis not present

## 2014-10-29 DIAGNOSIS — R633 Feeding difficulties: Secondary | ICD-10-CM | POA: Diagnosis not present

## 2014-10-29 DIAGNOSIS — J811 Chronic pulmonary edema: Secondary | ICD-10-CM | POA: Diagnosis not present

## 2014-10-29 DIAGNOSIS — R531 Weakness: Secondary | ICD-10-CM | POA: Diagnosis not present

## 2014-10-29 DIAGNOSIS — D72829 Elevated white blood cell count, unspecified: Secondary | ICD-10-CM | POA: Diagnosis not present

## 2014-10-29 DIAGNOSIS — T148 Other injury of unspecified body region: Secondary | ICD-10-CM | POA: Diagnosis not present

## 2014-10-29 DIAGNOSIS — J439 Emphysema, unspecified: Secondary | ICD-10-CM | POA: Diagnosis not present

## 2014-10-29 DIAGNOSIS — E785 Hyperlipidemia, unspecified: Secondary | ICD-10-CM | POA: Diagnosis present

## 2014-10-29 DIAGNOSIS — J942 Hemothorax: Secondary | ICD-10-CM | POA: Diagnosis not present

## 2014-10-29 DIAGNOSIS — E162 Hypoglycemia, unspecified: Secondary | ICD-10-CM | POA: Diagnosis not present

## 2014-10-29 DIAGNOSIS — Z9889 Other specified postprocedural states: Secondary | ICD-10-CM | POA: Diagnosis not present

## 2014-10-29 DIAGNOSIS — J9692 Respiratory failure, unspecified with hypercapnia: Secondary | ICD-10-CM | POA: Diagnosis not present

## 2014-10-29 DIAGNOSIS — I70202 Unspecified atherosclerosis of native arteries of extremities, left leg: Secondary | ICD-10-CM | POA: Diagnosis not present

## 2014-10-29 DIAGNOSIS — J9691 Respiratory failure, unspecified with hypoxia: Secondary | ICD-10-CM | POA: Diagnosis not present

## 2014-10-29 DIAGNOSIS — J9811 Atelectasis: Secondary | ICD-10-CM | POA: Diagnosis not present

## 2014-10-29 DIAGNOSIS — N179 Acute kidney failure, unspecified: Secondary | ICD-10-CM | POA: Diagnosis not present

## 2014-10-29 DIAGNOSIS — J84112 Idiopathic pulmonary fibrosis: Secondary | ICD-10-CM | POA: Diagnosis not present

## 2014-10-29 DIAGNOSIS — R579 Shock, unspecified: Secondary | ICD-10-CM | POA: Diagnosis not present

## 2014-10-29 DIAGNOSIS — E118 Type 2 diabetes mellitus with unspecified complications: Secondary | ICD-10-CM | POA: Diagnosis not present

## 2014-10-29 DIAGNOSIS — M79669 Pain in unspecified lower leg: Secondary | ICD-10-CM | POA: Diagnosis not present

## 2014-10-29 DIAGNOSIS — K259 Gastric ulcer, unspecified as acute or chronic, without hemorrhage or perforation: Secondary | ICD-10-CM | POA: Diagnosis not present

## 2014-10-29 DIAGNOSIS — J9621 Acute and chronic respiratory failure with hypoxia: Secondary | ICD-10-CM | POA: Diagnosis not present

## 2014-10-29 DIAGNOSIS — E099 Drug or chemical induced diabetes mellitus without complications: Secondary | ICD-10-CM | POA: Diagnosis not present

## 2014-10-29 DIAGNOSIS — E875 Hyperkalemia: Secondary | ICD-10-CM | POA: Diagnosis not present

## 2014-10-29 DIAGNOSIS — J90 Pleural effusion, not elsewhere classified: Secondary | ICD-10-CM | POA: Diagnosis not present

## 2014-10-29 DIAGNOSIS — E872 Acidosis: Secondary | ICD-10-CM | POA: Diagnosis not present

## 2014-10-29 DIAGNOSIS — J8410 Pulmonary fibrosis, unspecified: Secondary | ICD-10-CM | POA: Diagnosis not present

## 2014-10-29 DIAGNOSIS — D696 Thrombocytopenia, unspecified: Secondary | ICD-10-CM | POA: Diagnosis not present

## 2014-10-29 DIAGNOSIS — Z992 Dependence on renal dialysis: Secondary | ICD-10-CM | POA: Diagnosis not present

## 2014-10-29 DIAGNOSIS — R140 Abdominal distension (gaseous): Secondary | ICD-10-CM | POA: Diagnosis not present

## 2014-10-29 NOTE — Telephone Encounter (Signed)
absolutely

## 2014-11-04 ENCOUNTER — Telehealth: Payer: Self-pay | Admitting: Pulmonary Disease

## 2014-11-04 NOTE — Telephone Encounter (Signed)
Noted and will forward to [facility] as an [place]

## 2014-11-19 DIAGNOSIS — J942 Hemothorax: Secondary | ICD-10-CM | POA: Diagnosis not present

## 2014-12-05 DIAGNOSIS — J95830 Postprocedural hemorrhage and hematoma of a respiratory system organ or structure following a respiratory system procedure: Secondary | ICD-10-CM | POA: Diagnosis not present

## 2014-12-05 DIAGNOSIS — E46 Unspecified protein-calorie malnutrition: Secondary | ICD-10-CM | POA: Diagnosis not present

## 2014-12-05 DIAGNOSIS — Z942 Lung transplant status: Secondary | ICD-10-CM | POA: Diagnosis not present

## 2014-12-05 DIAGNOSIS — J942 Hemothorax: Secondary | ICD-10-CM | POA: Diagnosis not present

## 2015-03-07 DIAGNOSIS — N186 End stage renal disease: Secondary | ICD-10-CM | POA: Diagnosis not present

## 2015-03-10 DIAGNOSIS — R262 Difficulty in walking, not elsewhere classified: Secondary | ICD-10-CM | POA: Diagnosis not present

## 2015-03-11 DIAGNOSIS — I120 Hypertensive chronic kidney disease with stage 5 chronic kidney disease or end stage renal disease: Secondary | ICD-10-CM | POA: Diagnosis not present

## 2015-03-11 DIAGNOSIS — D631 Anemia in chronic kidney disease: Secondary | ICD-10-CM | POA: Diagnosis not present

## 2015-03-11 DIAGNOSIS — Z992 Dependence on renal dialysis: Secondary | ICD-10-CM | POA: Diagnosis not present

## 2015-03-11 DIAGNOSIS — Z79899 Other long term (current) drug therapy: Secondary | ICD-10-CM | POA: Diagnosis not present

## 2015-03-11 DIAGNOSIS — Z5181 Encounter for therapeutic drug level monitoring: Secondary | ICD-10-CM | POA: Diagnosis not present

## 2015-03-11 DIAGNOSIS — N186 End stage renal disease: Secondary | ICD-10-CM | POA: Diagnosis not present

## 2015-03-11 DIAGNOSIS — E1122 Type 2 diabetes mellitus with diabetic chronic kidney disease: Secondary | ICD-10-CM | POA: Diagnosis not present

## 2015-03-12 DIAGNOSIS — D899 Disorder involving the immune mechanism, unspecified: Secondary | ICD-10-CM | POA: Diagnosis not present

## 2015-03-12 DIAGNOSIS — Z79899 Other long term (current) drug therapy: Secondary | ICD-10-CM | POA: Diagnosis not present

## 2015-03-12 DIAGNOSIS — I120 Hypertensive chronic kidney disease with stage 5 chronic kidney disease or end stage renal disease: Secondary | ICD-10-CM | POA: Diagnosis not present

## 2015-03-12 DIAGNOSIS — I951 Orthostatic hypotension: Secondary | ICD-10-CM | POA: Diagnosis not present

## 2015-03-12 DIAGNOSIS — Z4824 Encounter for aftercare following lung transplant: Secondary | ICD-10-CM | POA: Diagnosis not present

## 2015-03-12 DIAGNOSIS — Z95828 Presence of other vascular implants and grafts: Secondary | ICD-10-CM | POA: Diagnosis not present

## 2015-03-12 DIAGNOSIS — N186 End stage renal disease: Secondary | ICD-10-CM | POA: Diagnosis not present

## 2015-03-12 DIAGNOSIS — Z452 Encounter for adjustment and management of vascular access device: Secondary | ICD-10-CM | POA: Diagnosis not present

## 2015-03-12 DIAGNOSIS — J9811 Atelectasis: Secondary | ICD-10-CM | POA: Diagnosis not present

## 2015-03-12 DIAGNOSIS — Z4682 Encounter for fitting and adjustment of non-vascular catheter: Secondary | ICD-10-CM | POA: Diagnosis not present

## 2015-03-12 DIAGNOSIS — E119 Type 2 diabetes mellitus without complications: Secondary | ICD-10-CM | POA: Diagnosis not present

## 2015-03-12 DIAGNOSIS — Z942 Lung transplant status: Secondary | ICD-10-CM | POA: Diagnosis not present

## 2015-03-12 DIAGNOSIS — Z794 Long term (current) use of insulin: Secondary | ICD-10-CM | POA: Diagnosis not present

## 2015-03-12 DIAGNOSIS — R918 Other nonspecific abnormal finding of lung field: Secondary | ICD-10-CM | POA: Diagnosis not present

## 2015-03-13 DIAGNOSIS — D631 Anemia in chronic kidney disease: Secondary | ICD-10-CM | POA: Diagnosis not present

## 2015-03-13 DIAGNOSIS — Z79899 Other long term (current) drug therapy: Secondary | ICD-10-CM | POA: Diagnosis not present

## 2015-03-13 DIAGNOSIS — E1122 Type 2 diabetes mellitus with diabetic chronic kidney disease: Secondary | ICD-10-CM | POA: Diagnosis not present

## 2015-03-13 DIAGNOSIS — N186 End stage renal disease: Secondary | ICD-10-CM | POA: Diagnosis not present

## 2015-03-13 DIAGNOSIS — Z992 Dependence on renal dialysis: Secondary | ICD-10-CM | POA: Diagnosis not present

## 2015-03-13 DIAGNOSIS — R262 Difficulty in walking, not elsewhere classified: Secondary | ICD-10-CM | POA: Diagnosis not present

## 2015-03-13 DIAGNOSIS — I120 Hypertensive chronic kidney disease with stage 5 chronic kidney disease or end stage renal disease: Secondary | ICD-10-CM | POA: Diagnosis not present

## 2015-03-16 DIAGNOSIS — N186 End stage renal disease: Secondary | ICD-10-CM | POA: Diagnosis not present

## 2015-03-16 DIAGNOSIS — Z992 Dependence on renal dialysis: Secondary | ICD-10-CM | POA: Diagnosis not present

## 2015-03-16 DIAGNOSIS — I120 Hypertensive chronic kidney disease with stage 5 chronic kidney disease or end stage renal disease: Secondary | ICD-10-CM | POA: Diagnosis not present

## 2015-03-16 DIAGNOSIS — D631 Anemia in chronic kidney disease: Secondary | ICD-10-CM | POA: Diagnosis not present

## 2015-03-16 DIAGNOSIS — E1122 Type 2 diabetes mellitus with diabetic chronic kidney disease: Secondary | ICD-10-CM | POA: Diagnosis not present

## 2015-03-16 DIAGNOSIS — Z79899 Other long term (current) drug therapy: Secondary | ICD-10-CM | POA: Diagnosis not present

## 2015-03-17 DIAGNOSIS — R262 Difficulty in walking, not elsewhere classified: Secondary | ICD-10-CM | POA: Diagnosis not present

## 2015-03-18 DIAGNOSIS — E1122 Type 2 diabetes mellitus with diabetic chronic kidney disease: Secondary | ICD-10-CM | POA: Diagnosis not present

## 2015-03-18 DIAGNOSIS — Z992 Dependence on renal dialysis: Secondary | ICD-10-CM | POA: Diagnosis not present

## 2015-03-18 DIAGNOSIS — D631 Anemia in chronic kidney disease: Secondary | ICD-10-CM | POA: Diagnosis not present

## 2015-03-18 DIAGNOSIS — I120 Hypertensive chronic kidney disease with stage 5 chronic kidney disease or end stage renal disease: Secondary | ICD-10-CM | POA: Diagnosis not present

## 2015-03-18 DIAGNOSIS — Z79899 Other long term (current) drug therapy: Secondary | ICD-10-CM | POA: Diagnosis not present

## 2015-03-18 DIAGNOSIS — N186 End stage renal disease: Secondary | ICD-10-CM | POA: Diagnosis not present

## 2015-03-19 DIAGNOSIS — Z942 Lung transplant status: Secondary | ICD-10-CM | POA: Diagnosis not present

## 2015-03-19 DIAGNOSIS — Z79899 Other long term (current) drug therapy: Secondary | ICD-10-CM | POA: Diagnosis not present

## 2015-03-19 DIAGNOSIS — Z008 Encounter for other general examination: Secondary | ICD-10-CM | POA: Diagnosis not present

## 2015-03-19 DIAGNOSIS — R918 Other nonspecific abnormal finding of lung field: Secondary | ICD-10-CM | POA: Diagnosis not present

## 2015-03-19 DIAGNOSIS — N186 End stage renal disease: Secondary | ICD-10-CM | POA: Diagnosis not present

## 2015-03-20 DIAGNOSIS — E1122 Type 2 diabetes mellitus with diabetic chronic kidney disease: Secondary | ICD-10-CM | POA: Diagnosis not present

## 2015-03-20 DIAGNOSIS — I120 Hypertensive chronic kidney disease with stage 5 chronic kidney disease or end stage renal disease: Secondary | ICD-10-CM | POA: Diagnosis not present

## 2015-03-20 DIAGNOSIS — N186 End stage renal disease: Secondary | ICD-10-CM | POA: Diagnosis not present

## 2015-03-20 DIAGNOSIS — D631 Anemia in chronic kidney disease: Secondary | ICD-10-CM | POA: Diagnosis not present

## 2015-03-20 DIAGNOSIS — Z79899 Other long term (current) drug therapy: Secondary | ICD-10-CM | POA: Diagnosis not present

## 2015-03-20 DIAGNOSIS — Z992 Dependence on renal dialysis: Secondary | ICD-10-CM | POA: Diagnosis not present

## 2015-03-20 DIAGNOSIS — R262 Difficulty in walking, not elsewhere classified: Secondary | ICD-10-CM | POA: Diagnosis not present

## 2015-03-23 DIAGNOSIS — R262 Difficulty in walking, not elsewhere classified: Secondary | ICD-10-CM | POA: Diagnosis not present

## 2015-03-23 DIAGNOSIS — E1122 Type 2 diabetes mellitus with diabetic chronic kidney disease: Secondary | ICD-10-CM | POA: Diagnosis not present

## 2015-03-23 DIAGNOSIS — D631 Anemia in chronic kidney disease: Secondary | ICD-10-CM | POA: Diagnosis not present

## 2015-03-23 DIAGNOSIS — Z992 Dependence on renal dialysis: Secondary | ICD-10-CM | POA: Diagnosis not present

## 2015-03-23 DIAGNOSIS — Z79899 Other long term (current) drug therapy: Secondary | ICD-10-CM | POA: Diagnosis not present

## 2015-03-23 DIAGNOSIS — I120 Hypertensive chronic kidney disease with stage 5 chronic kidney disease or end stage renal disease: Secondary | ICD-10-CM | POA: Diagnosis not present

## 2015-03-23 DIAGNOSIS — N186 End stage renal disease: Secondary | ICD-10-CM | POA: Diagnosis not present

## 2015-03-24 DIAGNOSIS — R262 Difficulty in walking, not elsewhere classified: Secondary | ICD-10-CM | POA: Diagnosis not present

## 2015-03-24 DIAGNOSIS — Z794 Long term (current) use of insulin: Secondary | ICD-10-CM | POA: Diagnosis not present

## 2015-03-24 DIAGNOSIS — E0822 Diabetes mellitus due to underlying condition with diabetic chronic kidney disease: Secondary | ICD-10-CM | POA: Diagnosis not present

## 2015-03-25 DIAGNOSIS — Z79899 Other long term (current) drug therapy: Secondary | ICD-10-CM | POA: Diagnosis not present

## 2015-03-25 DIAGNOSIS — E1122 Type 2 diabetes mellitus with diabetic chronic kidney disease: Secondary | ICD-10-CM | POA: Diagnosis not present

## 2015-03-25 DIAGNOSIS — N186 End stage renal disease: Secondary | ICD-10-CM | POA: Diagnosis not present

## 2015-03-25 DIAGNOSIS — Z992 Dependence on renal dialysis: Secondary | ICD-10-CM | POA: Diagnosis not present

## 2015-03-25 DIAGNOSIS — D631 Anemia in chronic kidney disease: Secondary | ICD-10-CM | POA: Diagnosis not present

## 2015-03-25 DIAGNOSIS — I120 Hypertensive chronic kidney disease with stage 5 chronic kidney disease or end stage renal disease: Secondary | ICD-10-CM | POA: Diagnosis not present

## 2015-03-26 DIAGNOSIS — R918 Other nonspecific abnormal finding of lung field: Secondary | ICD-10-CM | POA: Diagnosis not present

## 2015-03-26 DIAGNOSIS — N186 End stage renal disease: Secondary | ICD-10-CM | POA: Diagnosis not present

## 2015-03-26 DIAGNOSIS — E119 Type 2 diabetes mellitus without complications: Secondary | ICD-10-CM | POA: Diagnosis not present

## 2015-03-26 DIAGNOSIS — I120 Hypertensive chronic kidney disease with stage 5 chronic kidney disease or end stage renal disease: Secondary | ICD-10-CM | POA: Diagnosis not present

## 2015-03-26 DIAGNOSIS — Z4824 Encounter for aftercare following lung transplant: Secondary | ICD-10-CM | POA: Diagnosis not present

## 2015-03-26 DIAGNOSIS — J8410 Pulmonary fibrosis, unspecified: Secondary | ICD-10-CM | POA: Diagnosis not present

## 2015-03-26 DIAGNOSIS — D899 Disorder involving the immune mechanism, unspecified: Secondary | ICD-10-CM | POA: Diagnosis not present

## 2015-03-26 DIAGNOSIS — I951 Orthostatic hypotension: Secondary | ICD-10-CM | POA: Diagnosis not present

## 2015-03-26 DIAGNOSIS — Z942 Lung transplant status: Secondary | ICD-10-CM | POA: Diagnosis not present

## 2015-03-27 DIAGNOSIS — Z79899 Other long term (current) drug therapy: Secondary | ICD-10-CM | POA: Diagnosis not present

## 2015-03-27 DIAGNOSIS — I120 Hypertensive chronic kidney disease with stage 5 chronic kidney disease or end stage renal disease: Secondary | ICD-10-CM | POA: Diagnosis not present

## 2015-03-27 DIAGNOSIS — E1122 Type 2 diabetes mellitus with diabetic chronic kidney disease: Secondary | ICD-10-CM | POA: Diagnosis not present

## 2015-03-27 DIAGNOSIS — Z934 Other artificial openings of gastrointestinal tract status: Secondary | ICD-10-CM | POA: Diagnosis not present

## 2015-03-27 DIAGNOSIS — Z931 Gastrostomy status: Secondary | ICD-10-CM | POA: Diagnosis not present

## 2015-03-27 DIAGNOSIS — D631 Anemia in chronic kidney disease: Secondary | ICD-10-CM | POA: Diagnosis not present

## 2015-03-27 DIAGNOSIS — N186 End stage renal disease: Secondary | ICD-10-CM | POA: Diagnosis not present

## 2015-03-27 DIAGNOSIS — Z992 Dependence on renal dialysis: Secondary | ICD-10-CM | POA: Diagnosis not present

## 2015-03-27 DIAGNOSIS — R262 Difficulty in walking, not elsewhere classified: Secondary | ICD-10-CM | POA: Diagnosis not present

## 2015-03-30 DIAGNOSIS — R262 Difficulty in walking, not elsewhere classified: Secondary | ICD-10-CM | POA: Diagnosis not present

## 2015-03-30 DIAGNOSIS — I120 Hypertensive chronic kidney disease with stage 5 chronic kidney disease or end stage renal disease: Secondary | ICD-10-CM | POA: Diagnosis not present

## 2015-03-30 DIAGNOSIS — Z79899 Other long term (current) drug therapy: Secondary | ICD-10-CM | POA: Diagnosis not present

## 2015-03-30 DIAGNOSIS — E1122 Type 2 diabetes mellitus with diabetic chronic kidney disease: Secondary | ICD-10-CM | POA: Diagnosis not present

## 2015-03-30 DIAGNOSIS — Z992 Dependence on renal dialysis: Secondary | ICD-10-CM | POA: Diagnosis not present

## 2015-03-30 DIAGNOSIS — D631 Anemia in chronic kidney disease: Secondary | ICD-10-CM | POA: Diagnosis not present

## 2015-03-30 DIAGNOSIS — N186 End stage renal disease: Secondary | ICD-10-CM | POA: Diagnosis not present

## 2015-03-31 DIAGNOSIS — R262 Difficulty in walking, not elsewhere classified: Secondary | ICD-10-CM | POA: Diagnosis not present

## 2015-04-01 DIAGNOSIS — I120 Hypertensive chronic kidney disease with stage 5 chronic kidney disease or end stage renal disease: Secondary | ICD-10-CM | POA: Diagnosis not present

## 2015-04-01 DIAGNOSIS — Z79899 Other long term (current) drug therapy: Secondary | ICD-10-CM | POA: Diagnosis not present

## 2015-04-01 DIAGNOSIS — E1122 Type 2 diabetes mellitus with diabetic chronic kidney disease: Secondary | ICD-10-CM | POA: Diagnosis not present

## 2015-04-01 DIAGNOSIS — D631 Anemia in chronic kidney disease: Secondary | ICD-10-CM | POA: Diagnosis not present

## 2015-04-01 DIAGNOSIS — Z992 Dependence on renal dialysis: Secondary | ICD-10-CM | POA: Diagnosis not present

## 2015-04-01 DIAGNOSIS — N186 End stage renal disease: Secondary | ICD-10-CM | POA: Diagnosis not present

## 2015-04-02 DIAGNOSIS — I120 Hypertensive chronic kidney disease with stage 5 chronic kidney disease or end stage renal disease: Secondary | ICD-10-CM | POA: Diagnosis not present

## 2015-04-02 DIAGNOSIS — E785 Hyperlipidemia, unspecified: Secondary | ICD-10-CM | POA: Diagnosis not present

## 2015-04-02 DIAGNOSIS — Z79899 Other long term (current) drug therapy: Secondary | ICD-10-CM | POA: Diagnosis not present

## 2015-04-02 DIAGNOSIS — Z942 Lung transplant status: Secondary | ICD-10-CM | POA: Diagnosis not present

## 2015-04-02 DIAGNOSIS — Z7982 Long term (current) use of aspirin: Secondary | ICD-10-CM | POA: Diagnosis not present

## 2015-04-02 DIAGNOSIS — G4730 Sleep apnea, unspecified: Secondary | ICD-10-CM | POA: Diagnosis not present

## 2015-04-02 DIAGNOSIS — Z87891 Personal history of nicotine dependence: Secondary | ICD-10-CM | POA: Diagnosis not present

## 2015-04-02 DIAGNOSIS — N186 End stage renal disease: Secondary | ICD-10-CM | POA: Diagnosis not present

## 2015-04-02 DIAGNOSIS — E1122 Type 2 diabetes mellitus with diabetic chronic kidney disease: Secondary | ICD-10-CM | POA: Diagnosis not present

## 2015-04-02 DIAGNOSIS — Z992 Dependence on renal dialysis: Secondary | ICD-10-CM | POA: Diagnosis not present

## 2015-04-02 DIAGNOSIS — K219 Gastro-esophageal reflux disease without esophagitis: Secondary | ICD-10-CM | POA: Diagnosis not present

## 2015-04-02 DIAGNOSIS — Z794 Long term (current) use of insulin: Secondary | ICD-10-CM | POA: Diagnosis not present

## 2015-04-02 DIAGNOSIS — R918 Other nonspecific abnormal finding of lung field: Secondary | ICD-10-CM | POA: Diagnosis not present

## 2015-04-02 DIAGNOSIS — G4733 Obstructive sleep apnea (adult) (pediatric): Secondary | ICD-10-CM | POA: Diagnosis not present

## 2015-04-02 DIAGNOSIS — Z431 Encounter for attention to gastrostomy: Secondary | ICD-10-CM | POA: Diagnosis not present

## 2015-04-03 DIAGNOSIS — D631 Anemia in chronic kidney disease: Secondary | ICD-10-CM | POA: Diagnosis not present

## 2015-04-03 DIAGNOSIS — Z992 Dependence on renal dialysis: Secondary | ICD-10-CM | POA: Diagnosis not present

## 2015-04-03 DIAGNOSIS — E1122 Type 2 diabetes mellitus with diabetic chronic kidney disease: Secondary | ICD-10-CM | POA: Diagnosis not present

## 2015-04-03 DIAGNOSIS — Z79899 Other long term (current) drug therapy: Secondary | ICD-10-CM | POA: Diagnosis not present

## 2015-04-03 DIAGNOSIS — N186 End stage renal disease: Secondary | ICD-10-CM | POA: Diagnosis not present

## 2015-04-03 DIAGNOSIS — I120 Hypertensive chronic kidney disease with stage 5 chronic kidney disease or end stage renal disease: Secondary | ICD-10-CM | POA: Diagnosis not present

## 2015-04-03 DIAGNOSIS — R262 Difficulty in walking, not elsewhere classified: Secondary | ICD-10-CM | POA: Diagnosis not present

## 2015-04-04 DIAGNOSIS — R262 Difficulty in walking, not elsewhere classified: Secondary | ICD-10-CM | POA: Diagnosis not present

## 2015-04-06 DIAGNOSIS — D631 Anemia in chronic kidney disease: Secondary | ICD-10-CM | POA: Diagnosis not present

## 2015-04-06 DIAGNOSIS — Z79899 Other long term (current) drug therapy: Secondary | ICD-10-CM | POA: Diagnosis not present

## 2015-04-06 DIAGNOSIS — E1122 Type 2 diabetes mellitus with diabetic chronic kidney disease: Secondary | ICD-10-CM | POA: Diagnosis not present

## 2015-04-06 DIAGNOSIS — Z942 Lung transplant status: Secondary | ICD-10-CM | POA: Diagnosis not present

## 2015-04-06 DIAGNOSIS — I120 Hypertensive chronic kidney disease with stage 5 chronic kidney disease or end stage renal disease: Secondary | ICD-10-CM | POA: Diagnosis not present

## 2015-04-06 DIAGNOSIS — Z992 Dependence on renal dialysis: Secondary | ICD-10-CM | POA: Diagnosis not present

## 2015-04-06 DIAGNOSIS — R262 Difficulty in walking, not elsewhere classified: Secondary | ICD-10-CM | POA: Diagnosis not present

## 2015-04-06 DIAGNOSIS — N186 End stage renal disease: Secondary | ICD-10-CM | POA: Diagnosis not present

## 2015-04-07 DIAGNOSIS — R262 Difficulty in walking, not elsewhere classified: Secondary | ICD-10-CM | POA: Diagnosis not present

## 2015-04-07 DIAGNOSIS — N186 End stage renal disease: Secondary | ICD-10-CM | POA: Diagnosis not present

## 2015-04-07 DIAGNOSIS — Z942 Lung transplant status: Secondary | ICD-10-CM | POA: Diagnosis not present

## 2015-04-08 DIAGNOSIS — Z992 Dependence on renal dialysis: Secondary | ICD-10-CM | POA: Diagnosis not present

## 2015-04-08 DIAGNOSIS — R600 Localized edema: Secondary | ICD-10-CM | POA: Diagnosis not present

## 2015-04-08 DIAGNOSIS — Z942 Lung transplant status: Secondary | ICD-10-CM | POA: Diagnosis not present

## 2015-04-08 DIAGNOSIS — Z1159 Encounter for screening for other viral diseases: Secondary | ICD-10-CM | POA: Diagnosis not present

## 2015-04-08 DIAGNOSIS — I951 Orthostatic hypotension: Secondary | ICD-10-CM | POA: Diagnosis not present

## 2015-04-08 DIAGNOSIS — I120 Hypertensive chronic kidney disease with stage 5 chronic kidney disease or end stage renal disease: Secondary | ICD-10-CM | POA: Diagnosis not present

## 2015-04-08 DIAGNOSIS — Z79899 Other long term (current) drug therapy: Secondary | ICD-10-CM | POA: Diagnosis not present

## 2015-04-08 DIAGNOSIS — N186 End stage renal disease: Secondary | ICD-10-CM | POA: Diagnosis not present

## 2015-04-08 DIAGNOSIS — Z5181 Encounter for therapeutic drug level monitoring: Secondary | ICD-10-CM | POA: Diagnosis not present

## 2015-04-08 DIAGNOSIS — D631 Anemia in chronic kidney disease: Secondary | ICD-10-CM | POA: Diagnosis not present

## 2015-04-08 DIAGNOSIS — E1122 Type 2 diabetes mellitus with diabetic chronic kidney disease: Secondary | ICD-10-CM | POA: Diagnosis not present

## 2015-04-09 DIAGNOSIS — Z4824 Encounter for aftercare following lung transplant: Secondary | ICD-10-CM | POA: Diagnosis not present

## 2015-04-09 DIAGNOSIS — R918 Other nonspecific abnormal finding of lung field: Secondary | ICD-10-CM | POA: Diagnosis not present

## 2015-04-09 DIAGNOSIS — N186 End stage renal disease: Secondary | ICD-10-CM | POA: Diagnosis not present

## 2015-04-09 DIAGNOSIS — J9811 Atelectasis: Secondary | ICD-10-CM | POA: Diagnosis not present

## 2015-04-09 DIAGNOSIS — E1122 Type 2 diabetes mellitus with diabetic chronic kidney disease: Secondary | ICD-10-CM | POA: Diagnosis not present

## 2015-04-09 DIAGNOSIS — Z794 Long term (current) use of insulin: Secondary | ICD-10-CM | POA: Diagnosis not present

## 2015-04-09 DIAGNOSIS — D899 Disorder involving the immune mechanism, unspecified: Secondary | ICD-10-CM | POA: Diagnosis not present

## 2015-04-09 DIAGNOSIS — J90 Pleural effusion, not elsewhere classified: Secondary | ICD-10-CM | POA: Diagnosis not present

## 2015-04-09 DIAGNOSIS — Z79899 Other long term (current) drug therapy: Secondary | ICD-10-CM | POA: Diagnosis not present

## 2015-04-09 DIAGNOSIS — Z942 Lung transplant status: Secondary | ICD-10-CM | POA: Diagnosis not present

## 2015-04-09 DIAGNOSIS — Z992 Dependence on renal dialysis: Secondary | ICD-10-CM | POA: Diagnosis not present

## 2015-04-10 DIAGNOSIS — Z992 Dependence on renal dialysis: Secondary | ICD-10-CM | POA: Diagnosis not present

## 2015-04-10 DIAGNOSIS — D631 Anemia in chronic kidney disease: Secondary | ICD-10-CM | POA: Diagnosis not present

## 2015-04-10 DIAGNOSIS — R600 Localized edema: Secondary | ICD-10-CM | POA: Diagnosis not present

## 2015-04-10 DIAGNOSIS — I120 Hypertensive chronic kidney disease with stage 5 chronic kidney disease or end stage renal disease: Secondary | ICD-10-CM | POA: Diagnosis not present

## 2015-04-10 DIAGNOSIS — E1122 Type 2 diabetes mellitus with diabetic chronic kidney disease: Secondary | ICD-10-CM | POA: Diagnosis not present

## 2015-04-10 DIAGNOSIS — N186 End stage renal disease: Secondary | ICD-10-CM | POA: Diagnosis not present

## 2015-04-13 DIAGNOSIS — Z992 Dependence on renal dialysis: Secondary | ICD-10-CM | POA: Diagnosis not present

## 2015-04-13 DIAGNOSIS — R262 Difficulty in walking, not elsewhere classified: Secondary | ICD-10-CM | POA: Diagnosis not present

## 2015-04-13 DIAGNOSIS — R600 Localized edema: Secondary | ICD-10-CM | POA: Diagnosis not present

## 2015-04-13 DIAGNOSIS — E1122 Type 2 diabetes mellitus with diabetic chronic kidney disease: Secondary | ICD-10-CM | POA: Diagnosis not present

## 2015-04-13 DIAGNOSIS — I120 Hypertensive chronic kidney disease with stage 5 chronic kidney disease or end stage renal disease: Secondary | ICD-10-CM | POA: Diagnosis not present

## 2015-04-13 DIAGNOSIS — D631 Anemia in chronic kidney disease: Secondary | ICD-10-CM | POA: Diagnosis not present

## 2015-04-13 DIAGNOSIS — N186 End stage renal disease: Secondary | ICD-10-CM | POA: Diagnosis not present

## 2015-04-14 DIAGNOSIS — R262 Difficulty in walking, not elsewhere classified: Secondary | ICD-10-CM | POA: Diagnosis not present

## 2015-04-15 DIAGNOSIS — E1122 Type 2 diabetes mellitus with diabetic chronic kidney disease: Secondary | ICD-10-CM | POA: Diagnosis not present

## 2015-04-15 DIAGNOSIS — Z5181 Encounter for therapeutic drug level monitoring: Secondary | ICD-10-CM | POA: Diagnosis not present

## 2015-04-15 DIAGNOSIS — J9811 Atelectasis: Secondary | ICD-10-CM | POA: Diagnosis not present

## 2015-04-15 DIAGNOSIS — Z992 Dependence on renal dialysis: Secondary | ICD-10-CM | POA: Diagnosis not present

## 2015-04-15 DIAGNOSIS — Z79899 Other long term (current) drug therapy: Secondary | ICD-10-CM | POA: Diagnosis not present

## 2015-04-15 DIAGNOSIS — I120 Hypertensive chronic kidney disease with stage 5 chronic kidney disease or end stage renal disease: Secondary | ICD-10-CM | POA: Diagnosis not present

## 2015-04-15 DIAGNOSIS — N186 End stage renal disease: Secondary | ICD-10-CM | POA: Diagnosis not present

## 2015-04-15 DIAGNOSIS — T86818 Other complications of lung transplant: Secondary | ICD-10-CM | POA: Diagnosis not present

## 2015-04-15 DIAGNOSIS — D631 Anemia in chronic kidney disease: Secondary | ICD-10-CM | POA: Diagnosis not present

## 2015-04-15 DIAGNOSIS — R600 Localized edema: Secondary | ICD-10-CM | POA: Diagnosis not present

## 2015-04-16 DIAGNOSIS — Z942 Lung transplant status: Secondary | ICD-10-CM | POA: Diagnosis not present

## 2015-04-16 DIAGNOSIS — Z4824 Encounter for aftercare following lung transplant: Secondary | ICD-10-CM | POA: Diagnosis not present

## 2015-04-16 DIAGNOSIS — T17808A Unspecified foreign body in other parts of respiratory tract causing other injury, initial encounter: Secondary | ICD-10-CM | POA: Diagnosis not present

## 2015-04-16 DIAGNOSIS — T17308A Unspecified foreign body in larynx causing other injury, initial encounter: Secondary | ICD-10-CM | POA: Diagnosis not present

## 2015-04-17 DIAGNOSIS — N186 End stage renal disease: Secondary | ICD-10-CM | POA: Diagnosis not present

## 2015-04-17 DIAGNOSIS — E1122 Type 2 diabetes mellitus with diabetic chronic kidney disease: Secondary | ICD-10-CM | POA: Diagnosis not present

## 2015-04-17 DIAGNOSIS — I120 Hypertensive chronic kidney disease with stage 5 chronic kidney disease or end stage renal disease: Secondary | ICD-10-CM | POA: Diagnosis not present

## 2015-04-17 DIAGNOSIS — Z992 Dependence on renal dialysis: Secondary | ICD-10-CM | POA: Diagnosis not present

## 2015-04-17 DIAGNOSIS — R600 Localized edema: Secondary | ICD-10-CM | POA: Diagnosis not present

## 2015-04-17 DIAGNOSIS — D631 Anemia in chronic kidney disease: Secondary | ICD-10-CM | POA: Diagnosis not present

## 2015-04-18 ENCOUNTER — Emergency Department (HOSPITAL_COMMUNITY): Payer: Medicare Other

## 2015-04-18 ENCOUNTER — Inpatient Hospital Stay (HOSPITAL_COMMUNITY)
Admission: EM | Admit: 2015-04-18 | Discharge: 2015-04-19 | DRG: 871 | Disposition: A | Discharge status: Other Acute Inpatient Hospital | Payer: Medicare Other | Attending: Pulmonary Disease | Admitting: Pulmonary Disease

## 2015-04-18 ENCOUNTER — Encounter (HOSPITAL_COMMUNITY): Payer: Self-pay | Admitting: Emergency Medicine

## 2015-04-18 DIAGNOSIS — D72819 Decreased white blood cell count, unspecified: Secondary | ICD-10-CM | POA: Diagnosis present

## 2015-04-18 DIAGNOSIS — N186 End stage renal disease: Secondary | ICD-10-CM

## 2015-04-18 DIAGNOSIS — G4733 Obstructive sleep apnea (adult) (pediatric): Secondary | ICD-10-CM | POA: Diagnosis present

## 2015-04-18 DIAGNOSIS — J9621 Acute and chronic respiratory failure with hypoxia: Secondary | ICD-10-CM | POA: Diagnosis not present

## 2015-04-18 DIAGNOSIS — E785 Hyperlipidemia, unspecified: Secondary | ICD-10-CM | POA: Diagnosis present

## 2015-04-18 DIAGNOSIS — J189 Pneumonia, unspecified organism: Secondary | ICD-10-CM | POA: Diagnosis not present

## 2015-04-18 DIAGNOSIS — J9600 Acute respiratory failure, unspecified whether with hypoxia or hypercapnia: Secondary | ICD-10-CM | POA: Diagnosis present

## 2015-04-18 DIAGNOSIS — I120 Hypertensive chronic kidney disease with stage 5 chronic kidney disease or end stage renal disease: Secondary | ICD-10-CM | POA: Diagnosis present

## 2015-04-18 DIAGNOSIS — Z9289 Personal history of other medical treatment: Secondary | ICD-10-CM

## 2015-04-18 DIAGNOSIS — K631 Perforation of intestine (nontraumatic): Secondary | ICD-10-CM

## 2015-04-18 DIAGNOSIS — Z7952 Long term (current) use of systemic steroids: Secondary | ICD-10-CM | POA: Diagnosis not present

## 2015-04-18 DIAGNOSIS — E1122 Type 2 diabetes mellitus with diabetic chronic kidney disease: Secondary | ICD-10-CM | POA: Diagnosis present

## 2015-04-18 DIAGNOSIS — Z87891 Personal history of nicotine dependence: Secondary | ICD-10-CM

## 2015-04-18 DIAGNOSIS — A419 Sepsis, unspecified organism: Secondary | ICD-10-CM | POA: Diagnosis not present

## 2015-04-18 DIAGNOSIS — M1990 Unspecified osteoarthritis, unspecified site: Secondary | ICD-10-CM | POA: Diagnosis present

## 2015-04-18 DIAGNOSIS — Z7982 Long term (current) use of aspirin: Secondary | ICD-10-CM | POA: Diagnosis not present

## 2015-04-18 DIAGNOSIS — R404 Transient alteration of awareness: Secondary | ICD-10-CM | POA: Diagnosis not present

## 2015-04-18 DIAGNOSIS — R6520 Severe sepsis without septic shock: Secondary | ICD-10-CM | POA: Diagnosis not present

## 2015-04-18 DIAGNOSIS — J8410 Pulmonary fibrosis, unspecified: Secondary | ICD-10-CM | POA: Diagnosis present

## 2015-04-18 DIAGNOSIS — Z942 Lung transplant status: Secondary | ICD-10-CM

## 2015-04-18 DIAGNOSIS — I951 Orthostatic hypotension: Secondary | ICD-10-CM | POA: Diagnosis not present

## 2015-04-18 DIAGNOSIS — Y95 Nosocomial condition: Secondary | ICD-10-CM | POA: Diagnosis present

## 2015-04-18 DIAGNOSIS — R918 Other nonspecific abnormal finding of lung field: Secondary | ICD-10-CM | POA: Diagnosis not present

## 2015-04-18 DIAGNOSIS — K219 Gastro-esophageal reflux disease without esophagitis: Secondary | ICD-10-CM | POA: Diagnosis present

## 2015-04-18 DIAGNOSIS — I359 Nonrheumatic aortic valve disorder, unspecified: Secondary | ICD-10-CM | POA: Diagnosis present

## 2015-04-18 DIAGNOSIS — R6521 Severe sepsis with septic shock: Secondary | ICD-10-CM

## 2015-04-18 DIAGNOSIS — J984 Other disorders of lung: Secondary | ICD-10-CM | POA: Diagnosis not present

## 2015-04-18 DIAGNOSIS — G9340 Encephalopathy, unspecified: Secondary | ICD-10-CM

## 2015-04-18 DIAGNOSIS — J9690 Respiratory failure, unspecified, unspecified whether with hypoxia or hypercapnia: Secondary | ICD-10-CM

## 2015-04-18 DIAGNOSIS — E875 Hyperkalemia: Secondary | ICD-10-CM | POA: Diagnosis present

## 2015-04-18 DIAGNOSIS — R531 Weakness: Secondary | ICD-10-CM | POA: Diagnosis present

## 2015-04-18 DIAGNOSIS — E872 Acidosis: Secondary | ICD-10-CM | POA: Diagnosis not present

## 2015-04-18 DIAGNOSIS — Z452 Encounter for adjustment and management of vascular access device: Secondary | ICD-10-CM | POA: Diagnosis not present

## 2015-04-18 DIAGNOSIS — J811 Chronic pulmonary edema: Secondary | ICD-10-CM | POA: Diagnosis not present

## 2015-04-18 DIAGNOSIS — I469 Cardiac arrest, cause unspecified: Secondary | ICD-10-CM | POA: Diagnosis not present

## 2015-04-18 DIAGNOSIS — Z992 Dependence on renal dialysis: Secondary | ICD-10-CM

## 2015-04-18 DIAGNOSIS — R000 Tachycardia, unspecified: Secondary | ICD-10-CM | POA: Diagnosis not present

## 2015-04-18 DIAGNOSIS — T86812 Lung transplant infection: Secondary | ICD-10-CM | POA: Diagnosis not present

## 2015-04-18 DIAGNOSIS — J9811 Atelectasis: Secondary | ICD-10-CM | POA: Diagnosis not present

## 2015-04-18 DIAGNOSIS — Z4682 Encounter for fitting and adjustment of non-vascular catheter: Secondary | ICD-10-CM | POA: Diagnosis not present

## 2015-04-18 DIAGNOSIS — I959 Hypotension, unspecified: Secondary | ICD-10-CM | POA: Diagnosis not present

## 2015-04-18 DIAGNOSIS — J9601 Acute respiratory failure with hypoxia: Secondary | ICD-10-CM | POA: Diagnosis not present

## 2015-04-18 HISTORY — DX: Lung transplant status: Z94.2

## 2015-04-18 HISTORY — DX: Disorder of kidney and ureter, unspecified: N28.9

## 2015-04-18 LAB — COMPREHENSIVE METABOLIC PANEL
ALBUMIN: 3.3 g/dL — AB (ref 3.5–5.0)
ALBUMIN: 2.8 g/dL — AB (ref 3.5–5.0)
ALK PHOS: 60 U/L (ref 38–126)
ALK PHOS: 55 U/L (ref 38–126)
ALT: 14 U/L — ABNORMAL LOW (ref 17–63)
ALT: 20 U/L (ref 17–63)
ANION GAP: 14 (ref 5–15)
ANION GAP: 16 — AB (ref 5–15)
AST: 31 U/L (ref 15–41)
AST: 45 U/L — ABNORMAL HIGH (ref 15–41)
BILIRUBIN TOTAL: 0.5 mg/dL (ref 0.3–1.2)
BUN: 36 mg/dL — ABNORMAL HIGH (ref 6–20)
BUN: 38 mg/dL — ABNORMAL HIGH (ref 6–20)
CALCIUM: 10.5 mg/dL — AB (ref 8.9–10.3)
CALCIUM: 9.4 mg/dL (ref 8.9–10.3)
CHLORIDE: 103 mmol/L (ref 101–111)
CO2: 21 mmol/L — ABNORMAL LOW (ref 22–32)
CO2: 21 mmol/L — AB (ref 22–32)
Chloride: 104 mmol/L (ref 101–111)
Creatinine, Ser: 5.68 mg/dL — ABNORMAL HIGH (ref 0.61–1.24)
Creatinine, Ser: 5.78 mg/dL — ABNORMAL HIGH (ref 0.61–1.24)
GFR calc non Af Amer: 9 mL/min — ABNORMAL LOW (ref >60)
GFR calc non Af Amer: 9 mL/min — ABNORMAL LOW (ref >60)
GFR, EST AFRICAN AMERICAN: 11 mL/min — AB (ref >60)
GFR, EST AFRICAN AMERICAN: 11 mL/min — AB (ref >60)
GLUCOSE: 210 mg/dL — AB (ref 65–99)
Glucose, Bld: 137 mg/dL — ABNORMAL HIGH (ref 65–99)
POTASSIUM: 3.5 mmol/L (ref 3.5–5.1)
POTASSIUM: 3.5 mmol/L (ref 3.5–5.1)
Sodium: 139 mmol/L (ref 135–145)
Sodium: 140 mmol/L (ref 135–145)
TOTAL PROTEIN: 5.5 g/dL — AB (ref 6.5–8.1)
Total Bilirubin: 1 mg/dL (ref 0.3–1.2)
Total Protein: 4.7 g/dL — ABNORMAL LOW (ref 6.5–8.1)

## 2015-04-18 LAB — I-STAT CG4 LACTIC ACID, ED
LACTIC ACID, VENOUS: 6.44 mmol/L — AB (ref 0.5–2.0)
LACTIC ACID, VENOUS: 7.24 mmol/L — AB (ref 0.5–2.0)

## 2015-04-18 LAB — I-STAT ARTERIAL BLOOD GAS, ED
ACID-BASE DEFICIT: 6 mmol/L — AB (ref 0.0–2.0)
ACID-BASE DEFICIT: 6 mmol/L — AB (ref 0.0–2.0)
Acid-base deficit: 6 mmol/L — ABNORMAL HIGH (ref 0.0–2.0)
Bicarbonate: 18.8 mEq/L — ABNORMAL LOW (ref 20.0–24.0)
Bicarbonate: 22.5 mEq/L (ref 20.0–24.0)
Bicarbonate: 19.8 mEq/L — ABNORMAL LOW (ref 20.0–24.0)
O2 SAT: 94 %
O2 SAT: 71 %
O2 SAT: 98 %
PCO2 ART: 35.9 mmHg (ref 35.0–45.0)
PH ART: 7.337 — AB (ref 7.350–7.450)
PH ART: 7.178 — AB (ref 7.350–7.450)
PH ART: 7.265 — AB (ref 7.350–7.450)
Patient temperature: 102.8
Patient temperature: 102.7
TCO2: 20 mmol/L (ref 0–100)
TCO2: 24 mmol/L (ref 0–100)
TCO2: 21 mmol/L (ref 0–100)
pCO2 arterial: 62.5 mmHg (ref 35.0–45.0)
pCO2 arterial: 44.6 mmHg (ref 35.0–45.0)
pO2, Arterial: 85 mmHg (ref 80.0–100.0)
pO2, Arterial: 54 mmHg — ABNORMAL LOW (ref 80.0–100.0)
pO2, Arterial: 133 mmHg — ABNORMAL HIGH (ref 80.0–100.0)

## 2015-04-18 LAB — CBC WITH DIFFERENTIAL/PLATELET
BASOS ABS: 0 10*3/uL (ref 0.0–0.1)
BASOS PCT: 1 %
Basophils Absolute: 0 10*3/uL (ref 0.0–0.1)
Basophils Relative: 0 %
EOS ABS: 0 10*3/uL (ref 0.0–0.7)
EOS PCT: 0 %
Eosinophils Absolute: 0 10*3/uL (ref 0.0–0.7)
Eosinophils Relative: 0 %
HCT: 38 % — ABNORMAL LOW (ref 39.0–52.0)
HEMATOCRIT: 35.6 % — AB (ref 39.0–52.0)
HEMOGLOBIN: 11.9 g/dL — AB (ref 13.0–17.0)
Hemoglobin: 11 g/dL — ABNORMAL LOW (ref 13.0–17.0)
LYMPHS ABS: 0.4 10*3/uL — AB (ref 0.7–4.0)
LYMPHS PCT: 16 %
Lymphocytes Relative: 9 %
Lymphs Abs: 0.4 10*3/uL — ABNORMAL LOW (ref 0.7–4.0)
MCH: 31.3 pg (ref 26.0–34.0)
MCH: 31.2 pg (ref 26.0–34.0)
MCHC: 31.3 g/dL (ref 30.0–36.0)
MCHC: 30.9 g/dL (ref 30.0–36.0)
MCV: 100 fL (ref 78.0–100.0)
MCV: 100.8 fL — AB (ref 78.0–100.0)
MONO ABS: 0.3 10*3/uL (ref 0.1–1.0)
MONOS PCT: 10 %
Monocytes Absolute: 0.3 10*3/uL (ref 0.1–1.0)
Monocytes Relative: 7 %
NEUTROS PCT: 74 %
Neutro Abs: 2 10*3/uL (ref 1.7–7.7)
Neutro Abs: 3.6 10*3/uL (ref 1.7–7.7)
Neutrophils Relative %: 83 %
PLATELETS: 201 10*3/uL (ref 150–400)
Platelets: 214 10*3/uL (ref 150–400)
RBC: 3.8 MIL/uL — AB (ref 4.22–5.81)
RBC: 3.53 MIL/uL — ABNORMAL LOW (ref 4.22–5.81)
RDW: 15.7 % — ABNORMAL HIGH (ref 11.5–15.5)
RDW: 15.9 % — AB (ref 11.5–15.5)
WBC: 2.7 10*3/uL — AB (ref 4.0–10.5)
WBC: 4.3 10*3/uL (ref 4.0–10.5)

## 2015-04-18 LAB — PHOSPHORUS: PHOSPHORUS: 2 mg/dL — AB (ref 2.5–4.6)

## 2015-04-18 LAB — APTT: aPTT: 27 seconds (ref 24–37)

## 2015-04-18 LAB — I-STAT CHEM 8, ED
BUN: 55 mg/dL — ABNORMAL HIGH (ref 6–20)
CALCIUM ION: 1.31 mmol/L — AB (ref 1.13–1.30)
CHLORIDE: 104 mmol/L (ref 101–111)
Creatinine, Ser: 5.1 mg/dL — ABNORMAL HIGH (ref 0.61–1.24)
Glucose, Bld: 122 mg/dL — ABNORMAL HIGH (ref 65–99)
HEMATOCRIT: 40 % (ref 39.0–52.0)
Hemoglobin: 13.6 g/dL (ref 13.0–17.0)
Potassium: 4 mmol/L (ref 3.5–5.1)
SODIUM: 138 mmol/L (ref 135–145)
TCO2: 23 mmol/L (ref 0–100)

## 2015-04-18 LAB — PROCALCITONIN: PROCALCITONIN: 78.8 ng/mL

## 2015-04-18 LAB — CBG MONITORING, ED
GLUCOSE-CAPILLARY: 199 mg/dL — AB (ref 65–99)
GLUCOSE-CAPILLARY: 243 mg/dL — AB (ref 65–99)
GLUCOSE-CAPILLARY: 152 mg/dL — AB (ref 65–99)

## 2015-04-18 LAB — MAGNESIUM: MAGNESIUM: 1.4 mg/dL — AB (ref 1.7–2.4)

## 2015-04-18 LAB — CORTISOL: CORTISOL PLASMA: 57.8 ug/dL

## 2015-04-18 LAB — PROTIME-INR
INR: 1.19 (ref 0.00–1.49)
PROTHROMBIN TIME: 15.3 s — AB (ref 11.6–15.2)

## 2015-04-18 MED ORDER — ETOMIDATE 2 MG/ML IV SOLN
INTRAVENOUS | Status: AC | PRN
  Administered 2015-04-18 (×2): 10 mg via INTRAVENOUS

## 2015-04-18 MED ORDER — HYDROCORTISONE NA SUCCINATE PF 100 MG IJ SOLR
50.0000 mg | Freq: Four times a day (QID) | INTRAMUSCULAR | Status: DC
  Administered 2015-04-18 – 2015-04-19 (×3): 50 mg via INTRAVENOUS
  Filled 2015-04-18 (×3): qty 1
  Filled 2015-04-18 (×2): qty 2
  Filled 2015-04-18: qty 1

## 2015-04-18 MED ORDER — SODIUM CHLORIDE 0.9 % IV SOLN
INTRAVENOUS | Status: DC
  Administered 2015-04-18: 23:00:00 via INTRAVENOUS

## 2015-04-18 MED ORDER — NOREPINEPHRINE BITARTRATE 1 MG/ML IV SOLN: 2.0000 ug/min | INTRAVENOUS | Status: DC

## 2015-04-18 MED ORDER — ANTISEPTIC ORAL RINSE SOLUTION (CORINZ)
7.0000 mL | Freq: Four times a day (QID) | OROMUCOSAL | Status: DC
  Administered 2015-04-19 (×2): 7 mL via OROMUCOSAL

## 2015-04-18 MED ORDER — HYDROCORTISONE NA SUCCINATE PF 100 MG IJ SOLR: 50.0000 mg | Freq: Four times a day (QID) | INTRAMUSCULAR | Status: DC

## 2015-04-18 MED ORDER — AZITHROMYCIN 500 MG IV SOLR: 500.0000 mg | INTRAVENOUS | Status: DC

## 2015-04-18 MED ORDER — ETOMIDATE 2 MG/ML IV SOLN
INTRAVENOUS | Status: AC | PRN
  Administered 2015-04-18: 20 mg via INTRAVENOUS

## 2015-04-18 MED ORDER — LIDOCAINE HCL (CARDIAC) 20 MG/ML IV SOLN
INTRAVENOUS | Status: AC
  Filled 2015-04-18: qty 5

## 2015-04-18 MED ORDER — SODIUM CHLORIDE 0.9 % IV BOLUS (SEPSIS): 500.0000 mL | INTRAVENOUS | Status: DC

## 2015-04-18 MED ORDER — DEXTROSE 5 % IV SOLN
500.0000 mg | INTRAVENOUS | Status: DC
  Administered 2015-04-18 – 2015-04-19 (×2): 500 mg via INTRAVENOUS
  Filled 2015-04-18 (×2): qty 500

## 2015-04-18 MED ORDER — NOREPINEPHRINE BITARTRATE 1 MG/ML IV SOLN
0.0000 ug/min | Freq: Once | INTRAVENOUS | Status: AC
  Administered 2015-04-18: 2 ug/min via INTRAVENOUS
  Filled 2015-04-18: qty 4

## 2015-04-18 MED ORDER — INSULIN ASPART 100 UNIT/ML ~~LOC~~ SOLN
2.0000 [IU] | SUBCUTANEOUS | Status: DC
  Administered 2015-04-18: 6 [IU] via SUBCUTANEOUS
  Administered 2015-04-18 (×3): 4 [IU] via SUBCUTANEOUS
  Filled 2015-04-18 (×3): qty 1

## 2015-04-18 MED ORDER — FENTANYL CITRATE (PF) 100 MCG/2ML IJ SOLN
INTRAMUSCULAR | Status: AC | PRN
  Administered 2015-04-18: 50 ug via INTRAVENOUS

## 2015-04-18 MED ORDER — CHLORHEXIDINE GLUCONATE 0.12% ORAL RINSE (MEDLINE KIT)
15.0000 mL | Freq: Two times a day (BID) | OROMUCOSAL | Status: DC
  Administered 2015-04-18 – 2015-04-19 (×2): 15 mL via OROMUCOSAL

## 2015-04-18 MED ORDER — SODIUM CHLORIDE 0.9 % IV SOLN
100.0000 mg | INTRAVENOUS | Status: DC
  Administered 2015-04-19: 100 mg via INTRAVENOUS
  Filled 2015-04-18: qty 100

## 2015-04-18 MED ORDER — VASOPRESSIN 20 UNIT/ML IV SOLN: 0.0300 [IU]/min | INTRAVENOUS | Status: DC

## 2015-04-18 MED ORDER — PIPERACILLIN-TAZOBACTAM 3.375 G IVPB 30 MIN
3.3750 g | Freq: Three times a day (TID) | INTRAVENOUS | Status: DC
  Administered 2015-04-18: 3.375 g via INTRAVENOUS
  Filled 2015-04-18: qty 50

## 2015-04-18 MED ORDER — ROCURONIUM BROMIDE 50 MG/5ML IV SOLN
INTRAVENOUS | Status: AC | PRN
  Administered 2015-04-18: 50 mg via INTRAVENOUS

## 2015-04-18 MED ORDER — SUCCINYLCHOLINE CHLORIDE 20 MG/ML IJ SOLN
INTRAMUSCULAR | Status: AC
  Filled 2015-04-18: qty 1

## 2015-04-18 MED ORDER — HYDROCORTISONE NA SUCCINATE PF 100 MG IJ SOLR
50.0000 mg | Freq: Four times a day (QID) | INTRAMUSCULAR | Status: DC
  Administered 2015-04-18 (×2): 50 mg via INTRAVENOUS
  Filled 2015-04-18 (×2): qty 2

## 2015-04-18 MED ORDER — FENTANYL CITRATE (PF) 2500 MCG/50ML IJ SOLN: INTRAMUSCULAR | Status: DC

## 2015-04-18 MED ORDER — ETOMIDATE 2 MG/ML IV SOLN
INTRAVENOUS | Status: AC
  Filled 2015-04-18: qty 20

## 2015-04-18 MED ORDER — SODIUM BICARBONATE 8.4 % IV SOLN
INTRAVENOUS | Status: AC | PRN
  Administered 2015-04-18: 50 meq via INTRAVENOUS

## 2015-04-18 MED ORDER — SODIUM CHLORIDE 0.9 % IV SOLN: 100.0000 mg | INTRAVENOUS | Status: DC

## 2015-04-18 MED ORDER — ACETAMINOPHEN 160 MG/5ML PO SOLN: 650.0000 mg | Freq: Four times a day (QID) | ORAL | Status: DC | PRN

## 2015-04-18 MED ORDER — VANCOMYCIN HCL 500 MG IV SOLR
500.0000 mg | Freq: Once | INTRAVENOUS | Status: AC
  Administered 2015-04-18: 500 mg via INTRAVENOUS
  Filled 2015-04-18: qty 500

## 2015-04-18 MED ORDER — MIDAZOLAM HCL 2 MG/2ML IJ SOLN: 1.0000 mg | INTRAMUSCULAR | Status: DC | PRN

## 2015-04-18 MED ORDER — MIDAZOLAM HCL 5 MG/5ML IJ SOLN
INTRAMUSCULAR | Status: AC | PRN
  Administered 2015-04-18: 5 mg via INTRAVENOUS

## 2015-04-18 MED ORDER — SODIUM CHLORIDE 0.9 % IV BOLUS (SEPSIS): 1000.0000 mL | INTRAVENOUS | Status: DC

## 2015-04-18 MED ORDER — SODIUM CHLORIDE 0.9 % IV BOLUS (SEPSIS)
1000.0000 mL | INTRAVENOUS | Status: DC
  Administered 2015-04-18: 1000 mL via INTRAVENOUS

## 2015-04-18 MED ORDER — VASOPRESSIN 20 UNIT/ML IV SOLN
0.0300 [IU]/min | INTRAVENOUS | Status: DC
  Administered 2015-04-18: 0.03 [IU]/min via INTRAVENOUS
  Filled 2015-04-18 (×2): qty 2

## 2015-04-18 MED ORDER — NOREPINEPHRINE BITARTRATE 1 MG/ML IV SOLN
2.0000 ug/min | INTRAVENOUS | Status: DC
  Administered 2015-04-18: 30 ug/min via INTRAVENOUS
  Administered 2015-04-19: 20 ug/min via INTRAVENOUS
  Filled 2015-04-18 (×3): qty 16

## 2015-04-18 MED ORDER — VANCOMYCIN HCL IN DEXTROSE 1-5 GM/200ML-% IV SOLN
1000.0000 mg | Freq: Once | INTRAVENOUS | Status: AC
  Administered 2015-04-18: 1000 mg via INTRAVENOUS
  Filled 2015-04-18: qty 200

## 2015-04-18 MED ORDER — NOREPINEPHRINE BITARTRATE 1 MG/ML IV SOLN
2.0000 ug/min | INTRAVENOUS | Status: DC
  Administered 2015-04-18: 25 ug/min via INTRAVENOUS
  Filled 2015-04-18 (×2): qty 4

## 2015-04-18 MED ORDER — HEPARIN SODIUM (PORCINE) 5000 UNIT/ML IJ SOLN: 5000.0000 [IU] | Freq: Three times a day (TID) | INTRAMUSCULAR | Status: DC

## 2015-04-18 MED ORDER — HEPARIN SODIUM (PORCINE) 5000 UNIT/ML IJ SOLN
5000.0000 [IU] | Freq: Three times a day (TID) | INTRAMUSCULAR | Status: DC
  Administered 2015-04-18 – 2015-04-19 (×4): 5000 [IU] via SUBCUTANEOUS
  Filled 2015-04-18 (×6): qty 1

## 2015-04-18 MED ORDER — MIDAZOLAM HCL 2 MG/2ML IJ SOLN
INTRAMUSCULAR | Status: AC
  Filled 2015-04-18: qty 2

## 2015-04-18 MED ORDER — ACETAMINOPHEN 650 MG RE SUPP
975.0000 mg | Freq: Once | RECTAL | Status: AC
  Administered 2015-04-18: 975 mg via RECTAL
  Filled 2015-04-18 (×2): qty 1

## 2015-04-18 MED ORDER — INSULIN ASPART 100 UNIT/ML ~~LOC~~ SOLN: 2.0000 [IU] | SUBCUTANEOUS | Status: DC

## 2015-04-18 MED ORDER — SODIUM CHLORIDE 0.9 % IV SOLN: 50.0000 mg | INTRAVENOUS | Status: DC

## 2015-04-18 MED ORDER — SODIUM CHLORIDE 0.9 % IV SOLN
200.0000 mg | Freq: Once | INTRAVENOUS | Status: AC
  Administered 2015-04-18: 200 mg via INTRAVENOUS
  Filled 2015-04-18: qty 200

## 2015-04-18 MED ORDER — EPINEPHRINE HCL 0.1 MG/ML IJ SOSY
PREFILLED_SYRINGE | INTRAMUSCULAR | Status: AC | PRN
  Administered 2015-04-18: 1 via INTRAVENOUS

## 2015-04-18 MED ORDER — ROCURONIUM BROMIDE 50 MG/5ML IV SOLN
INTRAVENOUS | Status: AC
  Filled 2015-04-18: qty 2

## 2015-04-18 MED ORDER — FENTANYL CITRATE (PF) 2500 MCG/50ML IJ SOLN
25.0000 ug/h | INTRAMUSCULAR | Status: DC
  Administered 2015-04-18: 25 ug/h via INTRAVENOUS
  Filled 2015-04-18: qty 50

## 2015-04-18 MED ORDER — PIPERACILLIN-TAZOBACTAM IN DEX 2-0.25 GM/50ML IV SOLN
2.2500 g | Freq: Two times a day (BID) | INTRAVENOUS | Status: DC
  Administered 2015-04-18 – 2015-04-19 (×2): 2.25 g via INTRAVENOUS
  Filled 2015-04-18 (×3): qty 50

## 2015-04-18 MED ORDER — PIPERACILLIN-TAZOBACTAM IN DEX 2-0.25 GM/50ML IV SOLN: 2.2500 g | Freq: Two times a day (BID) | INTRAVENOUS | Status: DC

## 2015-04-18 MED ORDER — PANTOPRAZOLE SODIUM 40 MG IV SOLR
40.0000 mg | INTRAVENOUS | Status: DC
  Administered 2015-04-18 – 2015-04-19 (×2): 40 mg via INTRAVENOUS
  Filled 2015-04-18 (×2): qty 40

## 2015-04-18 MED ORDER — ACETAMINOPHEN 650 MG RE SUPP: 650.0000 mg | Freq: Once | RECTAL | Status: DC

## 2015-04-18 MED ORDER — PANTOPRAZOLE SODIUM 40 MG IV SOLR: 40.0000 mg | INTRAVENOUS | Status: DC

## 2015-04-18 MED ORDER — FENTANYL CITRATE (PF) 100 MCG/2ML IJ SOLN
INTRAMUSCULAR | Status: AC
  Filled 2015-04-18: qty 2

## 2015-04-18 NOTE — H&P (Signed)
PULMONARY / CRITICAL CARE MEDICINE   Name: [name] MRN: [id number] DOB: 12/28/1950    ADMISSION DATE:  04/18/2015 CONSULTATION DATE:  04/18/2015  REFERRING MD :  EDP  CHIEF COMPLAINT:  Septic shock and respiratory failure, RLL PNA  INITIAL PRESENTATION: 64 year old male s/p bilateral lung transplant in [facility] earlier this year who was discharged in august 2016.  Also history of ESRD on HD.  Was in normal state of health until 11/11 when he noticed some SOB and fatigue.  He came to the ED where he was diagnosed with RLL PNA associated with septic shock and respiratory failure.  History from wife as patient is altered.  Denies cough but reports fever and chill.  STUDIES:  CXR 11/11 RLL infiltrate.  SIGNIFICANT EVENTS: 11/11 acute respiratory failure requiring intubation.   HISTORY OF PRESENT ILLNESS:  64 year old male s/p bilateral lung transplant in [facility] earlier this year who was discharged in august 2016.  Also history of ESRD on HD.  Was in normal state of health until 11/11 when he noticed some SOB and fatigue.  He came to the ED where he was diagnosed with RLL PNA associated with septic shock and respiratory failure.  History from wife as patient is altered.  Denies cough but reports fever and chill.  PAST MEDICAL HISTORY :   has a past medical history of Benign neoplasm of colon; Aortic valve disorders; Osteoarthrosis, unspecified whether generalized or localized, unspecified site; Degeneration of intervertebral disc, site unspecified; Other and unspecified hyperlipidemia; Unspecified essential hypertension; Obstructive sleep apnea (adult) (pediatric); Esophageal reflux; Diaphragmatic hernia without mention of obstruction or gangrene; Pneumonia; Diabetes mellitus without complication ([place]); and Renal disorder.  has past surgical history that includes Lung biopsy (2010) and Lung transplant, single (Right). Prior to Admission medications   Medication Sig Start Date End Date  Taking? Authorizing Provider  cycloSPORINE modified (NEORAL) 100 MG capsule Take 1 capsule by mouth daily. 03/09/15 04/08/16 Yes Historical Provider, MD  insulin regular (NOVOLIN R,HUMULIN R) 100 units/mL injection Inject 8 Units into the skin 3 (three) times daily before meals. 8 units injected prior to breakfast 6 units injected prior to lunch 10 units injected prior to dinner 03/09/15  Yes Historical Provider, MD  amLODipine-valsartan (EXFORGE) 5-160 MG per tablet Take 1 tablet by mouth daily. 03/05/13   Wenda Low, MD  aspirin 81 MG tablet Take 81 mg by mouth daily.     Historical Provider, MD  azaTHIOprine (IMURAN) 50 MG tablet take 3 tablets by mouth once daily 11/26/13   Kathee Delton, MD  Calcium Carbonate-Vit D-Min (CALTRATE 600+D PLUS PO) Take by mouth daily.    Historical Provider, MD  fluticasone (FLONASE) 50 MCG/ACT nasal spray Place 2 sprays into the nose daily. Patient taking differently: Place 2 sprays into the nose as needed.  03/05/13   Wenda Low, MD  Insulin Detemir (LEVEMIR FLEXPEN [place]) Inject 25-35 units per day per instructions 07/22/14   Historical Provider, MD  Multiple Vitamin (MULTIVITAMIN) capsule Take 1 capsule by mouth daily.     Historical Provider, MD  potassium chloride (K-DUR,KLOR-CON) 10 MEQ tablet Take 1 tablet (10 mEq total) by mouth daily. 03/05/13   Wenda Low, MD  predniSONE (DELTASONE) 20 MG tablet take 1 tablet by mouth once daily 10/29/14   Kathee Delton, MD  predniSONE (DELTASONE) 5 MG tablet Take 3.5 tablets daily as directed Patient taking differently: 17 mg daily 12/02/13   Kathee Delton, MD  sulfamethoxazole-trimethoprim (BACTRIM DS,SEPTRA  DS) 800-160 MG per tablet Take 1 tablet by mouth daily. 05/26/14   Kathee Delton, MD  valGANciclovir (VALCYTE) 450 MG tablet Take 1 tablet by mouth every Monday. Patient takes one tablet every Monday and Friday after dialysis 03/26/15   Historical Provider, MD   Allergies  Allergen Reactions  . Levofloxacin      LOSS OF CONSCIOUSNESS  . Nsaids     Patient instructed not to take NSAID's after his lung transplant    FAMILY HISTORY:  has no family status information on file.  SOCIAL HISTORY:  reports that he quit smoking about 12 years ago. His smoking use included Cigarettes. He has a 6 pack-year smoking history. He has never used smokeless tobacco. He reports that he does not drink alcohol or use illicit drugs.  REVIEW OF SYSTEMS:  Unattainable, patient is altered.  SUBJECTIVE:   VITAL SIGNS: Temp:  [102.8 F (39.3 C)] 102.8 F (39.3 C) (11/12 1021) Pulse Rate:  [36-141] 117 (11/12 1215) Resp:  [23-42] 27 (11/12 1215) BP: (54-83)/(40-61) 73/61 mmHg (11/12 1215) SpO2:  [70 %-100 %] 100 % (11/12 1215) Weight:  [77.837 kg (171 lb 9.6 oz)] 77.837 kg (171 lb 9.6 oz) (11/12 1025) HEMODYNAMICS:   VENTILATOR SETTINGS:   INTAKE / OUTPUT:  Intake/Output Summary (Last 24 hours) at 04/18/15 1225 Last data filed at 04/18/15 1119  Gross per 24 hour  Intake   1250 ml  Output      0 ml  Net   1250 ml    PHYSICAL EXAMINATION: General:  Acute on chronically appearing AAM in significant respiratory distress. Neuro:  Arousable and following commands on all 4 ext. HEENT:  [place]/AT, PERRL, EOM-I and DMM. Cardiovascular:  RRR, Nl S1/S2, -M/R/G.. Lungs:  Diffuse crackles. Abdomen:  Soft, NT, ND and +BS. Musculoskeletal:  -edema and -tenderness. Skin:  Multiple scars on the right upper neck from previous access.  Tunneled R IJ HD catheter.  LABS:  CBC  Recent Labs Lab 04/18/15 1030 04/18/15 1149  WBC 2.7*  --   HGB 11.9* 13.6  HCT 38.0* 40.0  PLT 201  --    Coag's No results for input(s): APTT, INR in the last 168 hours. BMET  Recent Labs Lab 04/18/15 1030 04/18/15 1149  NA 139 138  K 3.5 4.0  CL 104 104  CO2 21*  --   BUN 36* 55*  CREATININE 5.68* 5.10*  GLUCOSE 137* 122*   Electrolytes  Recent Labs Lab 04/18/15 1030  CALCIUM 10.5*   Sepsis Markers  Recent Labs Lab  04/18/15 1044  LATICACIDVEN 6.44*   ABG  Recent Labs Lab 04/18/15 1043  PHART 7.337*  PCO2ART 35.9  PO2ART 85.0   Liver Enzymes  Recent Labs Lab 04/18/15 1030  AST 31  ALT 14*  ALKPHOS 60  BILITOT 0.5  ALBUMIN 3.3*   Cardiac Enzymes No results for input(s): TROPONINI, PROBNP in the last 168 hours. Glucose No results for input(s): GLUCAP in the last 168 hours.  Imaging Dg Chest Port 1 View  04/18/2015  CLINICAL DATA:  Per [facility] patient dialyzed yesterday and woke up this morning feeling weak. On EMS arrival to patient's home respirations in 40's, hypotensive, tachycardia. Patient arrived to ED on NRB, patient is alert and oriented x4. Patient is shivering. EXAM: PORTABLE CHEST 1 VIEW COMPARISON:  08/18/2013 FINDINGS: Right-sided dialysis catheter tip overlies the level of the lower superior vena cava. The heart is normal in size. Again noted are fibrotic changes within the lungs. More  focal opacity at the right lateral lung base is consistent with consolidation and pleural effusion. IMPRESSION: 1. Pulmonary fibrosis. 2. New right lower lobe consolidation and pleural effusion. Electronically Signed   By: Nolon Nations M.D.   On: 04/18/2015 10:42     ASSESSMENT / PLAN:  PULMONARY OETT 11/12>>> A: RLL PNA Bilateral lung transplant patient. P:   - Intubate. - Full vent support. - F/U ABG and CXR. - Abx as below. - Transfer to [facility]. - If staying here will need a bronch with BAL from RLLL.  CARDIOVASCULAR CVL L IJ TLC 11/12>>> A: Septic shock due to RLL pneumonia. P:  - Check CVP - Check BNP. - Levophed for shock. - Stress dose steroids. - Check 2d echo. - Vasopressin.  RENAL A:  ESRD-HD P:   - Renal consult. - KVO IVF. - IVF only if CVP is low. - BMET in AM. - Replace electrolytes as indicated.  GASTROINTESTINAL A:  No active issues. P:   - OGT. - Consult nutrition for TF in AM.  HEMATOLOGIC A:  Leukopenia P:  - CBC in  AM. - No need for reverse isolation at this time. - Check diff for absolute neutropenic time.  INFECTIOUS A:  RLL PNA. P:   BCx2 11/12>>> UC 11/12>>> Sputum 11/12>>>  Abx:  Vancomycin 11/12>>> Zosyn 11/12>>> Zithromax 11/12>>> Eraxis 11/12>>>  ENDOCRINE A:  DM   P:   - Cortisol level. - Stress dose steroids. - ISS. - CBG.  NEUROLOGIC A:  Lethargic due to sepsis. P:   RASS goal: 0 - Fentanyl drip - Versed PRN   FAMILY  - Updates: Wife updated bedside.  Full code status.  The patient is critically ill with multiple organ systems failure and requires high complexity decision making for assessment and support, frequent evaluation and titration of therapies, application of advanced monitoring technologies and extensive interpretation of multiple databases.   Critical Care Time devoted to patient care services described in this note is  45  Minutes. This time reflects time of care of this signee Dr Jennet Maduro. This critical care time does not reflect procedure time, or teaching time or supervisory time of PA/NP/Med student/Med Resident etc but could involve care discussion time.  Rush Farmer, M.D. [facility] Pulmonary/Critical Care Medicine. Pager: [phone]. After hours pager: [phone].  04/18/2015, 12:25 PM

## 2015-04-18 NOTE — ED Notes (Signed)
When In and out cathing this pt, the catheter was met with resistance. Unable to get sample. Marlise Eves RN was at bedside as well

## 2015-04-18 NOTE — Procedures (Signed)
Cardiac Pulmonary Resuscitation  Patient suffered a cardiac arrest during intubation, very difficult airway.  CPR x1 minute and one of epi with ROSC.  Rush Farmer, M.D. [facility] Pulmonary/Critical Care Medicine. Pager: [phone]. After hours pager: [phone].

## 2015-04-18 NOTE — ED Notes (Signed)
Spoke with critical care regarding call from [facility]. [facility] currently unable to accept patient because of maintenance issue which has limited their bed numbers. CCM stated that he will review orders and plan for admission vs transfer.

## 2015-04-18 NOTE — Code Documentation (Signed)
Patient noted to be in PEA.  No pulses present.  CPR initiated.

## 2015-04-18 NOTE — ED Notes (Signed)
Patient is barely shivering now.  Still alert and oriented x4

## 2015-04-18 NOTE — ED Notes (Signed)
Central line ready for use per CCM NP Salvadore Dom.

## 2015-04-18 NOTE — ED Notes (Signed)
Attempted to call report to receiving hospital.  [facility] is unable to accept the patient at this time, but will call back in "a few hours" when they expect that they will have a bed available.  The accepting physician is Georgia Lopes, the patient was going to [phone].  The phone number to the department is [phone].

## 2015-04-18 NOTE — Progress Notes (Addendum)
ANTIBIOTIC CONSULT NOTE - INITIAL  Pharmacy Consult for vancomycin and Zosyn Indication: rule out pneumonia  Allergies  Allergen Reactions  . Levofloxacin     LOSS OF CONSCIOUSNESS  . Nsaids     Patient instructed not to take NSAID's after his lung transplant    Patient Measurements: Weight: 171 lb 9.6 oz (77.837 kg)   Vital Signs: Temp: 102.8 F (39.3 C) (11/12 1021) Temp Source: Rectal (11/12 1021) BP: 66/40 mmHg (11/12 1014) Pulse Rate: 132 (11/12 1025) Intake/Output from previous day:   Intake/Output from this shift:    Labs: No results for input(s): WBC, HGB, PLT, LABCREA, CREATININE in the last 72 hours. CrCl cannot be calculated (Unknown ideal weight.). No results for input(s): VANCOTROUGH, VANCOPEAK, VANCORANDOM, GENTTROUGH, GENTPEAK, GENTRANDOM, TOBRATROUGH, TOBRAPEAK, TOBRARND, AMIKACINPEAK, AMIKACINTROU, AMIKACIN in the last 72 hours.   Microbiology: No results found for this or any previous visit (from the past 720 hour(s)).  Medical History: Past Medical History  Diagnosis Date  . Benign neoplasm of colon   . Aortic valve disorders   . Osteoarthrosis, unspecified whether generalized or localized, unspecified site   . Degeneration of intervertebral disc, site unspecified   . Other and unspecified hyperlipidemia   . Unspecified essential hypertension   . Obstructive sleep apnea (adult) (pediatric)   . Esophageal reflux   . 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 Pneumonia     interstitial pneumonia  . Diabetes mellitus without complication ([place])   . Renal disorder     Medications:   (Not in a hospital admission) Assessment: 64 yo man admitted 04/18/2015 for sepsis possibly d/t pna. Presented with weakness & shivering, RR 40s started on NRB in ED. Tachycardic to 130s, BP 66/40. Of note ESRD pt, last HD session on 11/11.  PMH OA, HLD, HTN, OSA, DM, lung transplant (03/2015), ESRD on HD MWF  ID Day #1 abx for  pna/sepsis Temp 102.8, ST 130s, RR 37. WBC 2.7, Lactate 6.44. Cr  5.68   11/12 vanc >> received 1g load 11/12 Zosyn >>  11/12 blood >> 11/12 urine >>  Goal of Therapy:  Vancomycin trough level 15-20 mcg/ml  Plan:  Give additional vancomycin 500 mg IV to complete 1500 mg load Vancomycin random level prior to next HD session  Zosyn 2.25g IV q12h Measure antibiotic drug levels at steady state Follow up culture results Follow up duration of therapy, clinical s/sx improvement   Heloise Ochoa, [place].D. PGY2 Pharmacy Resident Pager: [phone] 04/18/2015,10:51 AM

## 2015-04-18 NOTE — Progress Notes (Signed)
Pt transported to 2M14, pt was transported on a FiO2 of 100% uneventful transport. RN at bedside. Report given to unit  RT prior to transport.

## 2015-04-18 NOTE — Procedures (Signed)
Central Venous Catheter Insertion Procedure Note [name] [id number] June 24, 1950  Procedure: Insertion of Central Venous Catheter Indications: Assessment of intravascular volume, Drug and/or fluid administration and Frequent blood sampling  Procedure Details Consent: Risks of procedure as well as the alternatives and risks of each were explained to the (patient/caregiver).  Consent for procedure obtained. Time Out: Verified patient identification, verified procedure, site/side was marked, verified correct patient position, special equipment/implants available, medications/allergies/relevent history reviewed, required imaging and test results available.  Performed  Maximum sterile technique was used including antiseptics, cap, gloves, gown, hand hygiene, mask and sheet. Skin prep: Chlorhexidine; local anesthetic administered A antimicrobial bonded/coated triple lumen catheter was placed in the left internal jugular vein using the Seldinger technique.  Evaluation Blood flow good Complications: No apparent complications Patient did tolerate procedure well. Chest X-ray ordered to verify placement.  CXR: pending.  U/S used in placement.Marland Kitchen  YACOUB,WESAM 04/18/2015, 1:11 PM

## 2015-04-18 NOTE — Code Documentation (Signed)
No pulse detected.

## 2015-04-18 NOTE — Discharge Summary (Signed)
Physician Discharge Summary       Patient ID: [name] MRN: [id number] DOB/AGE: 1950/06/18 64 y.o.  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 Also history of ESRD on HD. Was in normal state of health until 11/11 when he noticed some SOB and fatigue. He came to the ED where he was diagnosed with RLL PNA associated with septic shock and respiratory failure w/ O2 sats 70% on room air and SBP in 60s. History from wife as patient is altered. Denied cough but reports fever and chill. [facility] was called w/ plan to transport but clinically unstable w/out secured airway and pressor support. Fluid bolus was limited to 1 liter per request of transplant team, levophed gtt was started and empiric vanc, zosyn and azithro were started.   The decision was made to intubate. Course complicated by brief esophageal intubation w/ subsequent hypoxia lasting no more than 2 minutes, but was further complicated by brief PEA arrest. He underwent 1 cycle of ACLS w/ ROSC. He was then intubated w/ a #7.5 ETT. Following intubation hew underwent FOB of RLL from which cultures, AFB, fungal, pneumocystis and legionella cultures were sent. CXR and decub abd were obtained. ETT was in good position. CVL was  Was re-directed cephalad to to mid subclavian, but ok to use for now; could consider pulling this back. At this point he is critically ill but given his transplant history he will be transferred back to [facility] for further care.   He was held at [facility] for the following 24 hrs while awaiting a bed at [facility]. Over this time his FIO2 requirements have improved. He is awake and  follows commands on the vent. We have been able to wean some of the vasoactive gtts. Unfortunately his potassium continued to rise so this forced [place] to initiate CRRT. [facility] was notified again as an update. We continued supportive care including broad spec abx, pressor support, antifungals, PRN sedation and CRRT up until the bed was available. He remains critically ill but we feel he is ready for transport to [facility] where he can be cared for by his transplant team.    Discharge Plan by active problems  PULMONARY OETT 11/12>>> A:  Acute hypoxic respiratory failure in setting of RLL PNA Bilateral lung transplant patient. P:  - Full vent support. - F/U ABG and CXR - Abx as below. - Transfer to [facility]. - f/u BAL results   CARDIOVASCULAR CVL L IJ TLC 11/12>>> A: Septic shock due to RLL pneumonia.      Brief PEA arrest  P:  - Continue levophed and vasopressin  - Stress dose steroids -->to continue  - f/u 2d echo. - keep even volume status   RENAL A: ESRD-HD P:  - will need renal consult on arrival to [facility] and would consider resuming CRRT on arrival  - [facility] IVF. - BMET in AM. - Replace electrolytes as indicated.  GASTROINTESTINAL A: No active issues. P:  - OGT. - Consult nutrition for TF in AM.  HEMATOLOGIC A: Leukopenia P:  - CBC in AM. - No need for reverse isolation at this time.  INFECTIOUS A: RLL PNA. P:  BCx2 11/12>>>  UC 11/12>>> Sputum 11/12>>>  Abx:  Vancomycin 11/12>>> Zosyn 11/12>>> Zithromax 11/12>>> Eraxis 11/12>>> Defer immunosuppression to [facility]  ENDOCRINE A: DM  P:  - Stress dose steroids. - ISS. - CBG.  NEUROLOGIC A: Lethargic due to sepsis. P:  RASS goal: 0 - Fentanyl drip - Versed PRN -serial neuro exam  FAMILY  - Updates: Wife updated bedside. Full code status.  Significant Hospital tests/ studies  Consults: Renal  See above   Discharge Exam: BP 105/74 mmHg  Pulse 101  Temp(Src) 98.7 F  (37.1 C) (Oral)  Resp 35  Ht '5\' 6"'$  (1.676 m)  Wt 65.5 kg (144 lb 6.4 oz)  BMI 23.32 kg/m2  SpO2 97%  General: Acute on chronically appearing AAM in significant respiratory distress. Neuro: Arousable and following commands on all 4 ext. HEENT: [place]/AT, PERRL, EOM-I and DMM. Cardiovascular: RRR, Nl S1/S2, -M/R/G.. Lungs: Diffuse crackles. Abdomen: Soft, NT, ND and +BS. Musculoskeletal: -edema and -tenderness. Skin: Multiple scars on the right upper neck from previous access. Tunneled R IJ HD catheter  Labs at discharge Lab Results  Component Value Date   CREATININE 6.74* 04/19/2015   BUN 51* 04/19/2015   NA 134* 04/19/2015   K 5.9* 04/19/2015   CL 100* 04/19/2015   CO2 21* 04/19/2015   Lab Results  Component Value Date   WBC 5.5 04/19/2015   HGB 11.0* 04/19/2015   HCT 36.1* 04/19/2015   MCV 99.2 04/19/2015   PLT 157 04/19/2015   Lab Results  Component Value Date   ALT 20 04/18/2015   AST 45* 04/18/2015   ALKPHOS 55 04/18/2015   BILITOT 1.0 04/18/2015   Lab Results  Component Value Date   INR 1.19 04/18/2015   INR 0.92 04/03/2009    Current radiology studies Dg Chest Port 1 View  04/19/2015  CLINICAL DATA:  Respiratory failure.  Shock. EXAM: PORTABLE CHEST 1 VIEW COMPARISON:  Yesterday FINDINGS: Endotracheal tube tip in good position between the clavicular heads and carina. Unchanged positioning of left IJ catheter, tip ascending in the right brachiocephalic vein. Tunneled catheter on the right is in good position with tip at the SVC level. Orogastric tube tip at least reaches the stomach. Partial clearing of right lung opacity, suspected pneumonia given history septic shock. There is chronic blunting of the right lateral costophrenic sulcus with possible superimposed small effusion. The right lung is more lucent than on previous imaging, presumably hyperinflation of the remaining lung given there are postoperative changes at the right hilum. Diffuse  interstitial coarsening of the left lung in this patient of pulmonary fibrosis. Normal heart size. No visible pneumothorax. IMPRESSION: 1. Unchanged positioning of tubes and lines, including left IJ catheter with tip at the right brachiocephalic vein. 2. Partial clearing of right basilar opacity, suspected pneumonia. 3. Pulmonary fibrosis and postoperative changes on the right. Electronically Signed   By: Monte Fantasia M.D.   On: 04/19/2015 06:02   Dg Chest Port 1 View  04/18/2015  ADDENDUM REPORT: 04/18/2015 14:03 ADDENDUM: Critical Value/emergent results were discussed with the patient's provider Jennet Maduro on 04/18/2015 at time 2:03 pm. Electronically Signed   By: Nolon Nations M.D.   On: 04/18/2015 14:03  04/18/2015  CLINICAL DATA:  Pt intubated w/ central line AND OG tube EXAM: PORTABLE CHEST 1 VIEW COMPARISON:  04/18/2015 FINDINGS: Endotracheal tube is in place with tip 3.6 cm above carina. Right dialysis catheter tips overlie the lower pole of the lower superior vena cava. Nasogastric tube tip is off the film  and beyond the gastroesophageal junction. Left IJ central line tip is redirected cephalad likely in the proximal right subclavian vein. Heart is not normal in size. There patchy densities within the lungs. More confluent opacity is noted at the left lung base compared prior study. There is no pneumothorax. IMPRESSION: 1. Interval placement of endotracheal tube, nasogastric tube, and left IJ central line. 2. IJ line crosses midline and courses cephalad.  See above. 3. Increased infiltrate at the left lung base. Critical Value/emergent results will be telephoned to the patient's provider Jennet Maduro at the time of interpretation on date 04/18/2015 at time 1:56 pm. Electronically Signed: By: Nolon Nations M.D. On: 04/18/2015 13:56   Dg Chest Port 1 View  04/18/2015  CLINICAL DATA:  Per [facility] patient dialyzed yesterday and woke up this morning feeling weak. On EMS arrival to patient's  home respirations in 40's, hypotensive, tachycardia. Patient arrived to ED on NRB, patient is alert and oriented x4. Patient is shivering. EXAM: PORTABLE CHEST 1 VIEW COMPARISON:  08/18/2013 FINDINGS: Right-sided dialysis catheter tip overlies the level of the lower superior vena cava. The heart is normal in size. Again noted are fibrotic changes within the lungs. More focal opacity at the right lateral lung base is consistent with consolidation and pleural effusion. IMPRESSION: 1. Pulmonary fibrosis. 2. New right lower lobe consolidation and pleural effusion. Electronically Signed   By: Nolon Nations M.D.   On: 04/18/2015 10:42   Dg Abd Portable 1v  04/18/2015  CLINICAL DATA:  Bowel perforation. EXAM: PORTABLE ABDOMEN - 1 VIEW COMPARISON:  Chest x-ray same day. FINDINGS: There appears to be an enteric tube with tip overlying the stomach. Right-sided decubitus film demonstrates no evidence of free peritoneal air. No dilated small bowel loops. Few air-fluid levels over the left upper quadrant and left abdomen. Remainder of the exam is unchanged. IMPRESSION: Nonobstructive bowel gas pattern.  No free peritoneal air. Electronically Signed   By: Marin Olp M.D.   On: 04/18/2015 14:00    Disposition:  01-Home or Self Care     Medication List    STOP taking these medications        acetaminophen 500 MG tablet  Commonly known as:  TYLENOL  Replaced by:  acetaminophen 160 MG/5ML solution     amLODipine-valsartan 5-160 MG tablet  Commonly known as:  EXFORGE     aspirin 81 MG tablet     calcium acetate 667 MG capsule  Commonly known as:  PHOSLO     CALTRATE 600+D PLUS PO     CITRACAL/VITAMIN D PO     clonazePAM 0.5 MG tablet  Commonly known as:  KLONOPIN     fluticasone 50 MCG/ACT nasal spray  Commonly known as:  FLONASE     gabapentin 100 MG capsule  Commonly known as:  NEURONTIN     insulin regular 100 units/mL injection  Commonly known as:  NOVOLIN R,HUMULIN R     LEVEMIR  FLEXPEN [place]     midodrine 5 MG tablet  Commonly known as:  PROAMATINE     multivitamin capsule     mycophenolate 250 MG capsule  Commonly known as:  CELLCEPT     NEORAL 100 MG capsule  Generic drug:  cycloSPORINE modified     omeprazole 20 MG capsule  Commonly known as:  PRILOSEC     ondansetron 4 MG tablet  Commonly known as:  ZOFRAN     potassium chloride 10 MEQ tablet  Commonly known as:  K-DUR,KLOR-CON  pravastatin 20 MG tablet  Commonly known as:  PRAVACHOL     predniSONE 20 MG tablet  Commonly known as:  DELTASONE     predniSONE 5 MG tablet  Commonly known as:  DELTASONE     sertraline 50 MG tablet  Commonly known as:  ZOLOFT     sulfamethoxazole-trimethoprim 800-160 MG tablet  Commonly known as:  BACTRIM DS,SEPTRA DS      TAKE these medications        acetaminophen 160 MG/5ML solution  Commonly known as:  TYLENOL  Place 20.3 mLs (650 mg total) into feeding tube every 6 (six) hours as needed for mild pain, headache or fever.     anidulafungin 100 mg in sodium chloride 0.9 % 100 mL  Inject 100 mg into the vein daily.     azaTHIOprine 50 MG tablet  Commonly known as:  IMURAN  take 3 tablets by mouth once daily     azithromycin 500 mg in dextrose 5 % 250 mL  Inject 500 mg into the vein daily.     chlorhexidine gluconate 0.12 % solution  Commonly known as:  PERIDEX  15 mLs by Mouth Rinse route 2 (two) times daily.     fentaNYL 2,500 mcg in sodium chloride 0.9 % 200 mL  Titrate as needed     heparin 5000 UNIT/ML injection  Inject 1 mL (5,000 Units total) into the skin every 8 (eight) hours.     hydrocortisone sodium succinate 100 MG Solr injection  Commonly known as:  SOLU-CORTEF  Inject 1 mL (50 mg total) into the vein every 6 (six) hours.     insulin aspart 100 UNIT/ML injection  Commonly known as:  novoLOG  Inject 2-6 Units into the skin every 4 (four) hours.     midazolam 2 MG/2ML Soln injection  Commonly known as:  VERSED  Inject 1-2  mLs (1-2 mg total) into the vein every 2 (two) hours as needed for agitation.     norepinephrine 4 mg in dextrose 5 % 250 mL  Inject 2-50 mcg/min into the vein continuous.     norepinephrine 16 mg in dextrose 5 % 250 mL  Titrate for MAP > 65     pantoprazole 40 MG injection  Commonly known as:  PROTONIX  Inject 40 mg into the vein daily.     piperacillin-tazobactam 2-0.25 GM/50ML IVPB  Commonly known as:  ZOSYN  Inject 50 mLs (2.25 g total) into the vein every 12 (twelve) hours.     piperacillin-tazobactam 3-0.375 GM/50ML IVPB  Commonly known as:  ZOSYN  Inject 50 mLs (3.375 g total) into the vein every 6 (six) hours.     sodium chloride 0.9 %  Inject 1,000 mLs into the vein every hour.     sodium chloride 0.9 % infusion  kvo ivfs     valGANciclovir 450 MG tablet  Commonly known as:  VALCYTE  Take 1 tablet by mouth every Monday. Patient takes one tablet every Monday and Friday after dialysis     Vancomycin 750 MG/150ML Soln  Commonly known as:  VANCOCIN  Inject 150 mLs (750 mg total) into the vein daily.  Start taking on:  04/20/2015     vasopressin 40 Units in sodium chloride 0.9 % 250 mL  Inject 0.03 Units/min into the vein continuous.         Discharged Condition: critical  Physician Statement:   The Patient was personally examined, the discharge assessment and plan has been personally reviewed and  I agree with ACNP Babcock's assessment and plan. > 30 minutes of time have been dedicated to discharge assessment, planning and discharge instructions.   Signed: Clementeen Graham 04/19/2015, 2:07 PM  Patient seen and examined, agree with above note.  I dictated the care and orders written for this patient under my direction.  Rush Farmer, MD [phone]

## 2015-04-18 NOTE — Procedures (Signed)
Intubation Procedure Note TAJI BARRETTO [id number] 1950/10/13  Procedure: Intubation Indications: Respiratory insufficiency  Procedure Details Consent: Risks of procedure as well as the alternatives and risks of each were explained to the (patient/caregiver).  Consent for procedure obtained. Time Out: Verified patient identification, verified procedure, site/side was marked, verified correct patient position, special equipment/implants available, medications/allergies/relevent history reviewed, required imaging and test results available.  Performed  Maximum sterile technique was used including antiseptics, gloves, hand hygiene and mask.  MAC    Evaluation Hemodynamic Status: BP stable throughout; O2 sats: stable throughout Patient's Current Condition: stable Complications: No apparent complications Patient did tolerate procedure well. Chest X-ray ordered to verify placement.  CXR: pending.   YACOUB,WESAM 04/18/2015

## 2015-04-18 NOTE — ED Notes (Signed)
Patient's lung sounds are unchanged from when patient arrived.

## 2015-04-18 NOTE — ED Provider Notes (Signed)
CSN: [id number]     Arrival date & time 04/18/15  0102 History   First MD Initiated Contact with Patient 04/18/15 1001     Chief Complaint  Patient presents with  . Code Sepsis     The history is provided by the patient, the spouse and the EMS personnel. No language interpreter was used.   [name] has a history of lung transplant and end-stage renal failure on dialysis. He presents today for evaluation of generalized weakness. He was in his routine state of health yesterday. This morning he developed shaking chills and gradually collapsed down to the ground with the assistance of his wife. He denies any complaints. Per wife he had his routine dialysis session yesterday without any difficulties. She states he had a long walk a few days ago without any complications. He is followed at [facility] for his lung transplant. Symptoms are severe, constant, worsening.  Past Medical History  Diagnosis Date  . Benign neoplasm of colon   . Aortic valve disorders   . Osteoarthrosis, unspecified whether generalized or localized, unspecified site   . Degeneration of intervertebral disc, site unspecified   . Other and unspecified hyperlipidemia   . Unspecified essential hypertension   . Obstructive sleep apnea (adult) (pediatric)   . Esophageal reflux   . 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 Pneumonia     interstitial pneumonia  . Diabetes mellitus without complication ([place])   . Renal disorder    Past Surgical History  Procedure Laterality Date  . Lung biopsy  2010  . Lung transplant, single Right    Family History  Problem Relation Age of Onset  . Pancreatic cancer Brother   . Heart disease Father    Social History  Substance Use Topics  . Smoking status: Former Smoker -- 0.30 packs/day for 20 years    Types: Cigarettes    Quit date: 06/06/2002  . Smokeless tobacco: Never Used  . Alcohol Use: No    Review of Systems  All other systems reviewed and are  negative.     Allergies  Levofloxacin and Nsaids  Home Medications   Prior to Admission medications   Medication Sig Start Date End Date Taking? Authorizing Provider  amLODipine-valsartan (EXFORGE) 5-160 MG per tablet Take 1 tablet by mouth daily. 03/05/13   Wenda Low, MD  aspirin 81 MG tablet Take 81 mg by mouth daily.     Historical Provider, MD  atorvastatin (LIPITOR) 20 MG tablet Take 20 mg by mouth daily. 1 by mouth daily 10/04/11   Historical Provider, MD  azaTHIOprine (IMURAN) 50 MG tablet take 3 tablets by mouth once daily 11/26/13   Kathee Delton, MD  Calcium Carbonate-Vit D-Min (CALTRATE 600+D PLUS PO) Take by mouth daily.    Historical Provider, MD  dextromethorphan-guaiFENesin (MUCINEX DM) 30-600 MG per 12 hr tablet Take 1 tablet by mouth daily as needed (for lung clearance).    Historical Provider, MD  fluticasone (FLONASE) 50 MCG/ACT nasal spray Place 2 sprays into the nose daily. Patient taking differently: Place 2 sprays into the nose as needed.  03/05/13   Wenda Low, MD  furosemide (LASIX) 20 MG tablet Take 20 mg by mouth daily.  03/05/13   Wenda Low, MD  GLIPIZIDE XL 5 MG 24 hr tablet Take 5 mg by mouth daily. 1 by mouth daily 09/19/11   Historical Provider, MD  Insulin Detemir (LEVEMIR FLEXPEN ) Inject 25-35 units per day per instructions 07/22/14  Historical Provider, MD  Multiple Vitamin (MULTIVITAMIN) capsule Take 1 capsule by mouth daily.     Historical Provider, MD  omeprazole (PRILOSEC) 40 MG capsule Take 40 mg by mouth daily.     Historical Provider, MD  potassium chloride (K-DUR,KLOR-CON) 10 MEQ tablet Take 1 tablet (10 mEq total) by mouth daily. 03/05/13   Wenda Low, MD  predniSONE (DELTASONE) 20 MG tablet take 1 tablet by mouth once daily 10/29/14   Kathee Delton, MD  predniSONE (DELTASONE) 5 MG tablet Take 3.5 tablets daily as directed Patient taking differently: 17 mg daily 12/02/13   Kathee Delton, MD  sulfamethoxazole-trimethoprim (BACTRIM  DS,SEPTRA DS) 800-160 MG per tablet Take 1 tablet by mouth daily. 05/26/14   Kathee Delton, MD   BP 68/50 mmHg  Pulse 118  Temp(Src) 102.8 F (39.3 C) (Rectal)  Resp 34  Wt 171 lb 9.6 oz (77.837 kg)  SpO2 100% Physical Exam  Constitutional: He is oriented to person, place, and time. He appears distressed.  HENT:  Head: Normocephalic and atraumatic.  Cardiovascular: Regular rhythm.   No murmur heard. Tachycardic  Pulmonary/Chest:  Tachypnea with diffuse rhonchi throughout the right lung, scattered rhonchi on the left. Vas-Cath in the right anterior chest wall with dressing intact, clean and dry  Abdominal: Soft. There is no tenderness. There is no rebound and no guarding.  Musculoskeletal: He exhibits no edema or tenderness.  Neurological: He is alert and oriented to person, place, and time.  Generalized weakness  Skin: Skin is warm and dry.  Psychiatric: He has a normal mood and affect. His behavior is normal.  Nursing note and vitals reviewed.   ED Course  Procedures (including critical care time) Labs Review Labs Reviewed  COMPREHENSIVE METABOLIC PANEL - Abnormal; Notable for the following:    CO2 21 (*)    Glucose, Bld 137 (*)    BUN 36 (*)    Creatinine, Ser 5.68 (*)    Calcium 10.5 (*)    Total Protein 5.5 (*)    Albumin 3.3 (*)    ALT 14 (*)    GFR calc non Af Amer 9 (*)    GFR calc Af Amer 11 (*)    All other components within normal limits  CBC WITH DIFFERENTIAL/PLATELET - Abnormal; Notable for the following:    WBC 2.7 (*)    RBC 3.80 (*)    Hemoglobin 11.9 (*)    HCT 38.0 (*)    RDW 15.7 (*)    All other components within normal limits  I-STAT CG4 LACTIC ACID, ED - Abnormal; Notable for the following:    Lactic Acid, Venous 6.44 (*)    All other components within normal limits  I-STAT ARTERIAL BLOOD GAS, ED - Abnormal; Notable for the following:    pH, Arterial 7.337 (*)    Bicarbonate 18.8 (*)    Acid-base deficit 6.0 (*)    All other components  within normal limits  I-STAT CHEM 8, ED - Abnormal; Notable for the following:    BUN 55 (*)    Creatinine, Ser 5.10 (*)    Glucose, Bld 122 (*)    Calcium, Ion 1.31 (*)    All other components within normal limits  CULTURE, BLOOD (ROUTINE X 2)  CULTURE, BLOOD (ROUTINE X 2)  URINE CULTURE  URINALYSIS, ROUTINE W REFLEX MICROSCOPIC (NOT AT [facility])    Imaging Review Dg Chest Port 1 View  04/18/2015  CLINICAL DATA:  Per [facility] patient dialyzed yesterday and woke  up this morning feeling weak. On EMS arrival to patient's home respirations in 40's, hypotensive, tachycardia. Patient arrived to ED on NRB, patient is alert and oriented x4. Patient is shivering. EXAM: PORTABLE CHEST 1 VIEW COMPARISON:  08/18/2013 FINDINGS: Right-sided dialysis catheter tip overlies the level of the lower superior vena cava. The heart is normal in size. Again noted are fibrotic changes within the lungs. More focal opacity at the right lateral lung base is consistent with consolidation and pleural effusion. IMPRESSION: 1. Pulmonary fibrosis. 2. New right lower lobe consolidation and pleural effusion. Electronically Signed   By: Nolon Nations M.D.   On: 04/18/2015 10:42   I have personally reviewed and evaluated these images and lab results as part of my medical decision-making.   EKG Interpretation   Date/Time:  Saturday April 18 2015 10:08:30 EST Ventricular Rate:  142 PR Interval:    QRS Duration: 75 QT Interval:  325 QTC Calculation: 499 R Axis:   64 Text Interpretation:  Sinus tachycardia Ventricular premature complex  Abnormal R-wave progression, early transition Borderline repolarization  abnormality Borderline prolonged QT interval Confirmed by Hazle Coca  [phone]) on 04/18/2015 10:33:19 AM      MDM   Final diagnoses:  HCAP (healthcare-associated pneumonia)   Patient is here with generalized weakness and shaking chills, in septic shock on ED arrival with suspected source of pneumonia. Treating  for HCAP. Patient initially responded to normal saline bolus with improvement in blood pressure to the mid 40H systolic. Discussed with critical care physician at [facility] given concern that patient is too unstable for transfer. Patient care was taken over by critical care unit.  Discussed with Dr. Whitman Hero at [facility] - recommend stabilization, stress dose steroids with 50 mg Solu-Cortef every 6 hours. Recommend holding CellCept, continuing cyclosporine 100 mg twice a day and checking a trough. Recommend continuing single strength Septra Monday Wednesday Friday for PCP prophylaxis. Number for transfer center at [facility] is [place], MD 04/18/15 1721

## 2015-04-18 NOTE — ED Notes (Signed)
While this RN in room with pt, pt opened his eyes, and was looking at respiratory therapist. The RT told the pt that he was going to the suction the pt, and that the pt would cough, and the pt nodded his head like he understood.

## 2015-04-18 NOTE — ED Notes (Signed)
Per [facility] patient dialyzed yesterday and woke up this morning feeling weak.  On EMS arrival to patient's home respirations in 40's, hypotensive, tachycardia.  Patient arrived to ED on NRB, patient is alert and oriented x4.  Patient is shivering.

## 2015-04-18 NOTE — Progress Notes (Signed)
RT called to assess patient and vent settings/alarms. Upon arrival patient initial vent settings 550/28/100/12. Peak pressures from 48-52, plateau pressures from 43-45. Patent airway, no secretions, Clear BBS, pt was not biting ETT, pt calm at the time. Pt was measured at 5'6". 8cc=532ms. Before calling [facility], I dropped VT to 510, inc RR to 30, there was no improvement with PP/Plateau. I called [facility] and suggest to Dr. SHalford Chessmanto drop patient to 6CC=380, increase RR 35. Following this change Peak Pressures dropped to 34, Plateau dropped to 32. Pt responded well to all changes and continues to rest comfortably. RT expressed concern with patient being recent lung transplant and vent settings with larger volumes. Dr. SHalford Chessmanagreed. RT will draw ABG @ 0000 to monitor change. RT will monitor.

## 2015-04-18 NOTE — ED Notes (Signed)
[facility] contacted this RN to notify them that they would not be able to accept the pt due to unexpected plumbing issues.

## 2015-04-19 ENCOUNTER — Encounter (HOSPITAL_COMMUNITY): Payer: Self-pay | Admitting: *Deleted

## 2015-04-19 ENCOUNTER — Inpatient Hospital Stay (HOSPITAL_COMMUNITY): Payer: Medicare Other

## 2015-04-19 DIAGNOSIS — J189 Pneumonia, unspecified organism: Secondary | ICD-10-CM | POA: Diagnosis present

## 2015-04-19 DIAGNOSIS — M1990 Unspecified osteoarthritis, unspecified site: Secondary | ICD-10-CM | POA: Diagnosis present

## 2015-04-19 DIAGNOSIS — N186 End stage renal disease: Secondary | ICD-10-CM | POA: Diagnosis not present

## 2015-04-19 DIAGNOSIS — J90 Pleural effusion, not elsewhere classified: Secondary | ICD-10-CM | POA: Diagnosis not present

## 2015-04-19 DIAGNOSIS — Z794 Long term (current) use of insulin: Secondary | ICD-10-CM | POA: Diagnosis not present

## 2015-04-19 DIAGNOSIS — J8410 Pulmonary fibrosis, unspecified: Secondary | ICD-10-CM | POA: Diagnosis not present

## 2015-04-19 DIAGNOSIS — Z942 Lung transplant status: Secondary | ICD-10-CM | POA: Diagnosis not present

## 2015-04-19 DIAGNOSIS — T86812 Lung transplant infection: Secondary | ICD-10-CM | POA: Diagnosis not present

## 2015-04-19 DIAGNOSIS — Z992 Dependence on renal dialysis: Secondary | ICD-10-CM

## 2015-04-19 DIAGNOSIS — J9622 Acute and chronic respiratory failure with hypercapnia: Secondary | ICD-10-CM | POA: Diagnosis present

## 2015-04-19 DIAGNOSIS — J9601 Acute respiratory failure with hypoxia: Secondary | ICD-10-CM | POA: Diagnosis not present

## 2015-04-19 DIAGNOSIS — J9602 Acute respiratory failure with hypercapnia: Secondary | ICD-10-CM | POA: Diagnosis not present

## 2015-04-19 DIAGNOSIS — F339 Major depressive disorder, recurrent, unspecified: Secondary | ICD-10-CM | POA: Diagnosis not present

## 2015-04-19 DIAGNOSIS — J8489 Other specified interstitial pulmonary diseases: Secondary | ICD-10-CM | POA: Diagnosis present

## 2015-04-19 DIAGNOSIS — Z87891 Personal history of nicotine dependence: Secondary | ICD-10-CM | POA: Diagnosis not present

## 2015-04-19 DIAGNOSIS — E0822 Diabetes mellitus due to underlying condition with diabetic chronic kidney disease: Secondary | ICD-10-CM | POA: Diagnosis not present

## 2015-04-19 DIAGNOSIS — R579 Shock, unspecified: Secondary | ICD-10-CM | POA: Diagnosis not present

## 2015-04-19 DIAGNOSIS — Z8711 Personal history of peptic ulcer disease: Secondary | ICD-10-CM | POA: Diagnosis not present

## 2015-04-19 DIAGNOSIS — I4891 Unspecified atrial fibrillation: Secondary | ICD-10-CM | POA: Diagnosis present

## 2015-04-19 DIAGNOSIS — J984 Other disorders of lung: Secondary | ICD-10-CM | POA: Diagnosis not present

## 2015-04-19 DIAGNOSIS — J209 Acute bronchitis, unspecified: Secondary | ICD-10-CM | POA: Diagnosis not present

## 2015-04-19 DIAGNOSIS — A419 Sepsis, unspecified organism: Secondary | ICD-10-CM | POA: Diagnosis not present

## 2015-04-19 DIAGNOSIS — J849 Interstitial pulmonary disease, unspecified: Secondary | ICD-10-CM | POA: Diagnosis not present

## 2015-04-19 DIAGNOSIS — R471 Dysarthria and anarthria: Secondary | ICD-10-CM | POA: Diagnosis not present

## 2015-04-19 DIAGNOSIS — E785 Hyperlipidemia, unspecified: Secondary | ICD-10-CM | POA: Diagnosis present

## 2015-04-19 DIAGNOSIS — J9621 Acute and chronic respiratory failure with hypoxia: Secondary | ICD-10-CM | POA: Diagnosis not present

## 2015-04-19 DIAGNOSIS — Z452 Encounter for adjustment and management of vascular access device: Secondary | ICD-10-CM | POA: Diagnosis not present

## 2015-04-19 DIAGNOSIS — D899 Disorder involving the immune mechanism, unspecified: Secondary | ICD-10-CM | POA: Diagnosis not present

## 2015-04-19 DIAGNOSIS — J9811 Atelectasis: Secondary | ICD-10-CM | POA: Diagnosis not present

## 2015-04-19 DIAGNOSIS — J811 Chronic pulmonary edema: Secondary | ICD-10-CM | POA: Diagnosis not present

## 2015-04-19 DIAGNOSIS — R918 Other nonspecific abnormal finding of lung field: Secondary | ICD-10-CM | POA: Diagnosis not present

## 2015-04-19 DIAGNOSIS — Z4682 Encounter for fitting and adjustment of non-vascular catheter: Secondary | ICD-10-CM | POA: Diagnosis not present

## 2015-04-19 DIAGNOSIS — Z79899 Other long term (current) drug therapy: Secondary | ICD-10-CM | POA: Diagnosis not present

## 2015-04-19 DIAGNOSIS — R0902 Hypoxemia: Secondary | ICD-10-CM | POA: Diagnosis not present

## 2015-04-19 DIAGNOSIS — R6520 Severe sepsis without septic shock: Secondary | ICD-10-CM | POA: Diagnosis not present

## 2015-04-19 DIAGNOSIS — F329 Major depressive disorder, single episode, unspecified: Secondary | ICD-10-CM | POA: Diagnosis present

## 2015-04-19 DIAGNOSIS — D631 Anemia in chronic kidney disease: Secondary | ICD-10-CM | POA: Diagnosis not present

## 2015-04-19 DIAGNOSIS — E1122 Type 2 diabetes mellitus with diabetic chronic kidney disease: Secondary | ICD-10-CM | POA: Diagnosis not present

## 2015-04-19 DIAGNOSIS — J9611 Chronic respiratory failure with hypoxia: Secondary | ICD-10-CM | POA: Diagnosis not present

## 2015-04-19 DIAGNOSIS — K219 Gastro-esophageal reflux disease without esophagitis: Secondary | ICD-10-CM | POA: Diagnosis present

## 2015-04-19 DIAGNOSIS — I120 Hypertensive chronic kidney disease with stage 5 chronic kidney disease or end stage renal disease: Secondary | ICD-10-CM | POA: Diagnosis present

## 2015-04-19 DIAGNOSIS — G4733 Obstructive sleep apnea (adult) (pediatric): Secondary | ICD-10-CM | POA: Diagnosis present

## 2015-04-19 DIAGNOSIS — Z8674 Personal history of sudden cardiac arrest: Secondary | ICD-10-CM | POA: Diagnosis not present

## 2015-04-19 DIAGNOSIS — E872 Acidosis: Secondary | ICD-10-CM | POA: Diagnosis present

## 2015-04-19 DIAGNOSIS — E875 Hyperkalemia: Secondary | ICD-10-CM | POA: Diagnosis present

## 2015-04-19 LAB — POCT I-STAT 3, ART BLOOD GAS (G3+)
Acid-base deficit: 4 mmol/L — ABNORMAL HIGH (ref 0.0–2.0)
Bicarbonate: 21.4 meq/L (ref 20.0–24.0)
O2 Saturation: 95 %
Patient temperature: 99.3
TCO2: 23 mmol/L (ref 0–100)
pCO2 arterial: 41.6 mmHg (ref 35.0–45.0)
pH, Arterial: 7.32 — ABNORMAL LOW (ref 7.350–7.450)
pO2, Arterial: 84 mmHg (ref 80.0–100.0)

## 2015-04-19 LAB — MRSA PCR SCREENING: MRSA by PCR: NEGATIVE

## 2015-04-19 LAB — BLOOD GAS, ARTERIAL
Acid-base deficit: 6 mmol/L — ABNORMAL HIGH (ref 0.0–2.0)
Bicarbonate: 19.3 mEq/L — ABNORMAL LOW (ref 20.0–24.0)
Drawn by: 36496
FIO2: 0.6
PATIENT TEMPERATURE: 98.4
PCO2 ART: 40.5 mmHg (ref 35.0–45.0)
PEEP: 12 cmH2O
PO2 ART: 99.3 mmHg (ref 80.0–100.0)
RATE: 35 resp/min
TCO2: 20.5 mmol/L (ref 0–100)
VT: 380 mL
pH, Arterial: 7.299 — ABNORMAL LOW (ref 7.350–7.450)

## 2015-04-19 LAB — RENAL FUNCTION PANEL
Albumin: 2.5 g/dL — ABNORMAL LOW (ref 3.5–5.0)
Anion gap: 13 (ref 5–15)
BUN: 51 mg/dL — ABNORMAL HIGH (ref 6–20)
CALCIUM: 8.5 mg/dL — AB (ref 8.9–10.3)
CO2: 21 mmol/L — ABNORMAL LOW (ref 22–32)
CREATININE: 6.74 mg/dL — AB (ref 0.61–1.24)
Chloride: 100 mmol/L — ABNORMAL LOW (ref 101–111)
GFR, EST AFRICAN AMERICAN: 9 mL/min — AB (ref >60)
GFR, EST NON AFRICAN AMERICAN: 8 mL/min — AB (ref >60)
Glucose, Bld: 94 mg/dL (ref 65–99)
Phosphorus: 4.4 mg/dL (ref 2.5–4.6)
Potassium: 5.9 mmol/L — ABNORMAL HIGH (ref 3.5–5.1)
SODIUM: 134 mmol/L — AB (ref 135–145)

## 2015-04-19 LAB — GLUCOSE, CAPILLARY
GLUCOSE-CAPILLARY: 88 mg/dL (ref 65–99)
GLUCOSE-CAPILLARY: 90 mg/dL (ref 65–99)
Glucose-Capillary: 161 mg/dL — ABNORMAL HIGH (ref 65–99)
Glucose-Capillary: 158 mg/dL — ABNORMAL HIGH (ref 65–99)
Glucose-Capillary: 96 mg/dL (ref 65–99)

## 2015-04-19 LAB — BASIC METABOLIC PANEL
ANION GAP: 13 (ref 5–15)
BUN: 48 mg/dL — AB (ref 6–20)
CHLORIDE: 99 mmol/L — AB (ref 101–111)
CO2: 22 mmol/L (ref 22–32)
Calcium: 8.7 mg/dL — ABNORMAL LOW (ref 8.9–10.3)
Creatinine, Ser: 6.32 mg/dL — ABNORMAL HIGH (ref 0.61–1.24)
GFR, EST AFRICAN AMERICAN: 10 mL/min — AB (ref >60)
GFR, EST NON AFRICAN AMERICAN: 8 mL/min — AB (ref >60)
Glucose, Bld: 100 mg/dL — ABNORMAL HIGH (ref 65–99)
POTASSIUM: 5.4 mmol/L — AB (ref 3.5–5.1)
SODIUM: 134 mmol/L — AB (ref 135–145)

## 2015-04-19 LAB — CBC
HCT: 36.1 % — ABNORMAL LOW (ref 39.0–52.0)
HEMOGLOBIN: 11 g/dL — AB (ref 13.0–17.0)
MCH: 30.2 pg (ref 26.0–34.0)
MCHC: 30.5 g/dL (ref 30.0–36.0)
MCV: 99.2 fL (ref 78.0–100.0)
PLATELETS: 157 10*3/uL (ref 150–400)
RBC: 3.64 MIL/uL — AB (ref 4.22–5.81)
RDW: 15.9 % — ABNORMAL HIGH (ref 11.5–15.5)
WBC: 5.5 10*3/uL (ref 4.0–10.5)

## 2015-04-19 LAB — PHOSPHORUS: PHOSPHORUS: 3.7 mg/dL (ref 2.5–4.6)

## 2015-04-19 LAB — PNEUMOCYSTIS JIROVECI SMEAR BY DFA: PNEUMOCYSTIS JIROVECI AG: NEGATIVE

## 2015-04-19 LAB — MAGNESIUM: MAGNESIUM: 1.2 mg/dL — AB (ref 1.7–2.4)

## 2015-04-19 LAB — PROCALCITONIN

## 2015-04-19 MED ORDER — CHLORHEXIDINE GLUCONATE 0.12% ORAL RINSE (MEDLINE KIT): 15.0000 mL | Freq: Two times a day (BID) | OROMUCOSAL | Status: DC

## 2015-04-19 MED ORDER — SODIUM BICARBONATE 8.4 % IV SOLN
50.0000 meq | Freq: Once | INTRAVENOUS | Status: AC
  Administered 2015-04-19: 50 meq via INTRAVENOUS

## 2015-04-19 MED ORDER — VANCOMYCIN HCL IN DEXTROSE 750-5 MG/150ML-% IV SOLN
750.0000 mg | INTRAVENOUS | Status: DC
  Filled 2015-04-19: qty 150

## 2015-04-19 MED ORDER — STERILE WATER FOR INJECTION IV SOLN
INTRAVENOUS | Status: DC
  Filled 2015-04-19 (×3): qty 150

## 2015-04-19 MED ORDER — INSULIN ASPART 100 UNIT/ML ~~LOC~~ SOLN
10.0000 [IU] | Freq: Once | SUBCUTANEOUS | Status: AC
  Administered 2015-04-19: 10 [IU] via INTRAVENOUS

## 2015-04-19 MED ORDER — HEPARIN (PORCINE) 2000 UNITS/L FOR CRRT
INTRAVENOUS_CENTRAL | Status: DC | PRN
  Filled 2015-04-19: qty 1000

## 2015-04-19 MED ORDER — INSULIN ASPART 100 UNIT/ML ~~LOC~~ SOLN: 10.0000 [IU] | Freq: Once | SUBCUTANEOUS | Status: DC

## 2015-04-19 MED ORDER — PIPERACILLIN-TAZOBACTAM 3.375 G IVPB 30 MIN: 3.3750 g | Freq: Four times a day (QID) | INTRAVENOUS | Status: DC

## 2015-04-19 MED ORDER — PIPERACILLIN-TAZOBACTAM 3.375 G IVPB 30 MIN
3.3750 g | Freq: Four times a day (QID) | INTRAVENOUS | Status: DC
  Filled 2015-04-19 (×4): qty 50

## 2015-04-19 MED ORDER — ACETAMINOPHEN 160 MG/5ML PO SOLN: 650.0000 mg | Freq: Four times a day (QID) | ORAL | Status: DC | PRN

## 2015-04-19 MED ORDER — DEXTROSE 50 % IV SOLN
1.0000 | Freq: Once | INTRAVENOUS | Status: AC
  Administered 2015-04-19: 50 mL via INTRAVENOUS

## 2015-04-19 MED ORDER — HEPARIN SODIUM (PORCINE) 1000 UNIT/ML DIALYSIS
1000.0000 [IU] | INTRAMUSCULAR | Status: DC | PRN
  Filled 2015-04-19: qty 3
  Filled 2015-04-19: qty 6
  Filled 2015-04-19: qty 1

## 2015-04-19 MED ORDER — NOREPINEPHRINE BITARTRATE 1 MG/ML IV SOLN: INTRAVENOUS | Status: DC

## 2015-04-19 MED ORDER — CALCIUM CHLORIDE 10 % IV SOLN
1.0000 g | Freq: Once | INTRAVENOUS | Status: AC
  Administered 2015-04-19: 1 g via INTRAVENOUS

## 2015-04-19 MED ORDER — SODIUM BICARBONATE 8.4 % IV SOLN
INTRAVENOUS | Status: AC
  Filled 2015-04-19: qty 50

## 2015-04-19 MED ORDER — VANCOMYCIN HCL IN DEXTROSE 750-5 MG/150ML-% IV SOLN: 750.0000 mg | INTRAVENOUS | Status: DC

## 2015-04-19 MED ORDER — PRISMASOL BGK 4/2.5 32-4-2.5 MEQ/L IV SOLN
INTRAVENOUS | Status: DC
  Filled 2015-04-19 (×7): qty 5000

## 2015-04-19 MED ORDER — PRISMASOL BGK 4/2.5 32-4-2.5 MEQ/L IV SOLN
INTRAVENOUS | Status: DC
  Filled 2015-04-19 (×2): qty 5000

## 2015-04-19 MED ORDER — SODIUM CHLORIDE 0.9 % IV SOLN: INTRAVENOUS | Status: DC

## 2015-04-19 MED ORDER — DEXTROSE 50 % IV SOLN
INTRAVENOUS | Status: AC
  Filled 2015-04-19: qty 50

## 2015-04-19 NOTE — Progress Notes (Signed)
Report called to RN at [place] RN received report. Provided her my phone number in case of any questions once patient arrives at [facility].

## 2015-04-19 NOTE — Consult Note (Signed)
[facility] Renal Consultation Note  Requesting MD: Youcoub Indication for Consultation: ESRD pt from [place]- s/p bilateral lung tx  HPI:  [name] is a 64 y.o. 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  Patient was in [place], was in [place] for the weekend- developed SOB on 11/11- presented to the ER with respiratory failure and sepsis associated with PNA.  He has required intubation and pressors- also had a brief cardiac arrest.  Plans had been for him to transfer to [facility] however his potassium is up this AM- originally thought was hemolyzed and not true but repeat is 5.9.  [facility] is unable to give [place] a timetable on transfer so feel we need to initiate CRRT to get K down prior to transfer.  Wife is at bedside and pt is alert on vent  CREATININE, SER  Date/Time Value Ref Range Status  04/19/2015 08:48 AM 6.74* 0.61 - 1.24 mg/dL Final  04/19/2015 04:26 AM 6.32* 0.61 - 1.24 mg/dL Final  04/18/2015 01:56 PM 5.78* 0.61 - 1.24 mg/dL Final  04/18/2015 11:49 AM 5.10* 0.61 - 1.24 mg/dL Final  04/18/2015 10:30 AM 5.68* 0.61 - 1.24 mg/dL Final  08/17/2013 03:45 AM 0.96 0.50 - 1.35 mg/dL Final  08/16/2013 03:15 AM 1.09 0.50 - 1.35 mg/dL Final  08/15/2013 03:28 PM 1.09 0.50 - 1.35 mg/dL Final  08/14/2013 07:38 AM 1.2 0.4 - 1.5 mg/dL Final  07/29/2013 10:16 AM 1.3 0.4 - 1.5 mg/dL Final  03/04/2013 03:38 AM 1.01 0.50 - 1.35 mg/dL Final  03/03/2013 08:26 PM 1.08 0.50 - 1.35 mg/dL Final  11/12/2012 05:20 AM 1.05 0.50 - 1.35 mg/dL Final  11/11/2012 02:02 PM 1.30 0.50 - 1.35 mg/dL Final  04/09/2009 04:13 AM 1.16 0.4 - 1.5 mg/dL Final  04/08/2009 03:30 AM 1.28 0.4 - 1.5 mg/dL Final  04/03/2009 11:27 AM 1.12 0.4 - 1.5 mg/dL Final     PMHx:    Past Medical History  Diagnosis Date  . Benign neoplasm of colon   . Aortic valve disorders   . Osteoarthrosis, unspecified whether generalized or localized, unspecified site   . Degeneration of intervertebral disc, site unspecified   . Other and unspecified hyperlipidemia   . Unspecified essential hypertension   . Obstructive sleep apnea (adult) (pediatric)   . Esophageal reflux   . 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 Pneumonia     interstitial pneumonia  . Diabetes mellitus without complication ([place])   . Renal disorder   . Transplanted, lung [facility])     Past Surgical History  Procedure Laterality Date  . Lung biopsy  2010  . Lung transplant, single Right     Family Hx:  Family History  Problem Relation Age of Onset  . Pancreatic cancer Brother   . Heart disease Father     Social History:  reports that he quit smoking about 12 years ago. His smoking use included Cigarettes. He has a 6 pack-year smoking history. He has never used smokeless tobacco. He reports that he does not drink alcohol or use illicit drugs.  Allergies:  Allergies  Allergen Reactions  . Levofloxacin     LOSS OF CONSCIOUSNESS  . Nsaids     Patient  instructed not to take NSAID's after his lung transplant    Medications: Prior to Admission medications   Medication Sig Start Date End Date Taking? Authorizing Provider  acetaminophen (TYLENOL) 500 MG tablet Take 500 mg by mouth every 6 (six) hours as needed for fever or headache.   Yes Historical Provider, MD  aspirin 81 MG tablet Take 81 mg by mouth daily.    Yes Historical Provider, MD  calcium acetate (PHOSLO) 667 MG capsule Take 667 mg by mouth 3 (three) times daily with meals.   Yes Historical Provider, MD  Calcium Carbonate-Vit D-Min (CALTRATE 600+D PLUS PO) Take by mouth daily.   Yes Historical Provider, MD  Calcium Citrate-Vitamin D (CITRACAL/VITAMIN D PO) Take 400 mg by mouth 2 (two) times daily.   Yes Historical  Provider, MD  clonazePAM (KLONOPIN) 0.5 MG tablet Take 0.25 mg by mouth as needed for anxiety (shaking).   Yes Historical Provider, MD  cycloSPORINE modified (NEORAL) 100 MG capsule Take 1 capsule by mouth daily. 03/09/15 04/08/16 Yes Historical Provider, MD  fluticasone (FLONASE) 50 MCG/ACT nasal spray Place 2 sprays into the nose daily. Patient taking differently: Place 2 sprays into the nose as needed.  03/05/13  Yes Wenda Low, MD  gabapentin (NEURONTIN) 100 MG capsule Take 100 mg by mouth 2 (two) times daily.   Yes Historical Provider, MD  insulin regular (NOVOLIN R,HUMULIN R) 100 units/mL injection Inject 8 Units into the skin 3 (three) times daily before meals. 8 units injected prior to breakfast 6 units injected prior to lunch 10 units injected prior to dinner 03/09/15  Yes Historical Provider, MD  midodrine (PROAMATINE) 5 MG tablet Take 5 mg by mouth 2 (two) times daily.   Yes Historical Provider, MD  Multiple Vitamin (MULTIVITAMIN) capsule Take 1 capsule by mouth daily.    Yes Historical Provider, MD  mycophenolate (CELLCEPT) 250 MG capsule Take 1,000 mg by mouth 2 (two) times daily.   Yes Historical Provider, MD  omeprazole (PRILOSEC) 20 MG capsule Take 20 mg by mouth daily.   Yes Historical Provider, MD  ondansetron (ZOFRAN) 4 MG tablet Take 4 mg by mouth every 8 (eight) hours as needed for nausea or vomiting.   Yes Historical Provider, MD  pravastatin (PRAVACHOL) 20 MG tablet Take 20 mg by mouth daily.   Yes Historical Provider, MD  predniSONE (DELTASONE) 5 MG tablet Take 3.5 tablets daily as directed Patient taking differently: 15 mg. 3 tablets by mouth daily 12/02/13  Yes Kathee Delton, MD  sertraline (ZOLOFT) 50 MG tablet Take 50 mg by mouth daily.   Yes Historical Provider, MD  sulfamethoxazole-trimethoprim (BACTRIM DS,SEPTRA DS) 800-160 MG per tablet Take 1 tablet by mouth daily. Patient taking differently: Take 1 tablet by mouth daily. Take one tablet by mouth on mondays,  wednesdays and fridays 05/26/14  Yes Kathee Delton, MD  valGANciclovir (VALCYTE) 450 MG tablet Take 1 tablet by mouth every Monday. Patient takes one tablet every Monday and Friday after dialysis 03/26/15  Yes Historical Provider, MD  amLODipine-valsartan (EXFORGE) 5-160 MG per tablet Take 1 tablet by mouth daily. 03/05/13   Wenda Low, MD  anidulafungin 100 mg in sodium chloride 0.9 % 100 mL Inject 100 mg into the vein daily. 04/19/15   Erick Colace, NP  azaTHIOprine (IMURAN) 50 MG tablet take 3 tablets by mouth once daily 11/26/13   Kathee Delton, MD  azithromycin 500 mg in dextrose 5 % 250 mL Inject 500 mg into the vein daily. 04/18/15  Erick Colace, NP  fentaNYL 2,500 mcg in sodium chloride 0.9 % 200 mL Titrate as needed 04/18/15   Erick Colace, NP  heparin 5000 UNIT/ML injection Inject 1 mL (5,000 Units total) into the skin every 8 (eight) hours. 04/18/15   Erick Colace, NP  hydrocortisone sodium succinate (SOLU-CORTEF) 100 MG SOLR injection Inject 1 mL (50 mg total) into the vein every 6 (six) hours. 04/18/15   Erick Colace, NP  insulin aspart (NOVOLOG) 100 UNIT/ML injection Inject 2-6 Units into the skin every 4 (four) hours. 04/18/15   Erick Colace, NP  Insulin Detemir (LEVEMIR FLEXPEN [place]) Inject 25-35 units per day per instructions 07/22/14   Historical Provider, MD  midazolam (VERSED) 2 MG/2ML SOLN injection Inject 1-2 mLs (1-2 mg total) into the vein every 2 (two) hours as needed for agitation. 04/18/15   Erick Colace, NP  norepinephrine 4 mg in dextrose 5 % 250 mL Inject 2-50 mcg/min into the vein continuous. 04/18/15   Erick Colace, NP  pantoprazole (PROTONIX) 40 MG injection Inject 40 mg into the vein daily. 04/18/15   Erick Colace, NP  piperacillin-tazobactam (ZOSYN) 2-0.25 GM/50ML IVPB Inject 50 mLs (2.25 g total) into the vein every 12 (twelve) hours. 04/18/15   Erick Colace, NP  potassium chloride (K-DUR,KLOR-CON) 10 MEQ tablet Take 1 tablet (10 mEq  total) by mouth daily. 03/05/13   Wenda Low, MD  predniSONE (DELTASONE) 20 MG tablet take 1 tablet by mouth once daily 10/29/14   Kathee Delton, MD  sodium chloride 0.9 % Inject 1,000 mLs into the vein every hour. 04/18/15   Erick Colace, NP  vasopressin 40 Units in sodium chloride 0.9 % 250 mL Inject 0.03 Units/min into the vein continuous. 04/18/15   Erick Colace, NP    I have reviewed the patient's current medications.  Labs:  Results for orders placed or performed during the hospital encounter of 04/18/15 (from the past 48 hour(s))  Comprehensive metabolic panel     Status: Abnormal   Collection Time: 04/18/15 10:30 AM  Result Value Ref Range   Sodium 139 135 - 145 mmol/L   Potassium 3.5 3.5 - 5.1 mmol/L   Chloride 104 101 - 111 mmol/L   CO2 21 (L) 22 - 32 mmol/L   Glucose, Bld 137 (H) 65 - 99 mg/dL   BUN 36 (H) 6 - 20 mg/dL   Creatinine, Ser 5.68 (H) 0.61 - 1.24 mg/dL   Calcium 10.5 (H) 8.9 - 10.3 mg/dL   Total Protein 5.5 (L) 6.5 - 8.1 g/dL   Albumin 3.3 (L) 3.5 - 5.0 g/dL   AST 31 15 - 41 U/L   ALT 14 (L) 17 - 63 U/L   Alkaline Phosphatase 60 38 - 126 U/L   Total Bilirubin 0.5 0.3 - 1.2 mg/dL   GFR calc non Af Amer 9 (L) >60 mL/min   GFR calc Af Amer 11 (L) >60 mL/min    Comment: (NOTE) The eGFR has been calculated using the CKD EPI equation. This calculation has not been validated in all clinical situations. eGFR's persistently <60 mL/min signify possible Chronic Kidney Disease.    Anion gap 14 5 - 15  CBC WITH DIFFERENTIAL     Status: Abnormal   Collection Time: 04/18/15 10:30 AM  Result Value Ref Range   WBC 2.7 (L) 4.0 - 10.5 K/uL   RBC 3.80 (L) 4.22 - 5.81 MIL/uL   Hemoglobin 11.9 (L) 13.0 - 17.0  g/dL   HCT 38.0 (L) 39.0 - 52.0 %   MCV 100.0 78.0 - 100.0 fL   MCH 31.3 26.0 - 34.0 pg   MCHC 31.3 30.0 - 36.0 g/dL   RDW 15.7 (H) 11.5 - 15.5 %   Platelets 201 150 - 400 K/uL   Neutrophils Relative % 74 %   Lymphocytes Relative 16 %   Monocytes Relative  10 %   Eosinophils Relative 0 %   Basophils Relative 0 %   Neutro Abs 2.0 1.7 - 7.7 K/uL   Lymphs Abs 0.4 (L) 0.7 - 4.0 K/uL   Monocytes Absolute 0.3 0.1 - 1.0 K/uL   Eosinophils Absolute 0.0 0.0 - 0.7 K/uL   Basophils Absolute 0.0 0.0 - 0.1 K/uL   WBC Morphology ATYPICAL LYMPHOCYTES    Smear Review LARGE PLATELETS PRESENT   I-Stat arterial blood gas, ED ([facility], [facility])     Status: Abnormal   Collection Time: 04/18/15 10:43 AM  Result Value Ref Range   pH, Arterial 7.337 (L) 7.350 - 7.450   pCO2 arterial 35.9 35.0 - 45.0 mmHg   pO2, Arterial 85.0 80.0 - 100.0 mmHg   Bicarbonate 18.8 (L) 20.0 - 24.0 mEq/L   TCO2 20 0 - 100 mmol/L   O2 Saturation 94.0 %   Acid-base deficit 6.0 (H) 0.0 - 2.0 mmol/L   Patient temperature 102.8 F    Collection site RADIAL, ALLEN'S TEST ACCEPTABLE    Drawn by Operator    Sample type ARTERIAL   I-Stat CG4 Lactic Acid, ED  (not at  [facility])     Status: Abnormal   Collection Time: 04/18/15 10:44 AM  Result Value Ref Range   Lactic Acid, Venous 6.44 (HH) 0.5 - 2.0 mmol/L   Comment NOTIFIED PHYSICIAN   I-stat Chem 8, ED     Status: Abnormal   Collection Time: 04/18/15 11:49 AM  Result Value Ref Range   Sodium 138 135 - 145 mmol/L   Potassium 4.0 3.5 - 5.1 mmol/L   Chloride 104 101 - 111 mmol/L   BUN 55 (H) 6 - 20 mg/dL   Creatinine, Ser 5.10 (H) 0.61 - 1.24 mg/dL   Glucose, Bld 122 (H) 65 - 99 mg/dL   Calcium, Ion 1.31 (H) 1.13 - 1.30 mmol/L   TCO2 23 0 - 100 mmol/L   Hemoglobin 13.6 13.0 - 17.0 g/dL   HCT 40.0 39.0 - 52.0 %  CBC with Differential/Platelet     Status: Abnormal   Collection Time: 04/18/15  1:56 PM  Result Value Ref Range   WBC 4.3 4.0 - 10.5 K/uL   RBC 3.53 (L) 4.22 - 5.81 MIL/uL   Hemoglobin 11.0 (L) 13.0 - 17.0 g/dL   HCT 35.6 (L) 39.0 - 52.0 %   MCV 100.8 (H) 78.0 - 100.0 fL   MCH 31.2 26.0 - 34.0 pg   MCHC 30.9 30.0 - 36.0 g/dL   RDW 15.9 (H) 11.5 - 15.5 %   Platelets 214 150 - 400 K/uL   Neutrophils Relative % 83 %    Lymphocytes Relative 9 %   Monocytes Relative 7 %   Eosinophils Relative 0 %   Basophils Relative 1 %   Neutro Abs 3.6 1.7 - 7.7 K/uL   Lymphs Abs 0.4 (L) 0.7 - 4.0 K/uL   Monocytes Absolute 0.3 0.1 - 1.0 K/uL   Eosinophils Absolute 0.0 0.0 - 0.7 K/uL   Basophils Absolute 0.0 0.0 - 0.1 K/uL   WBC Morphology ATYPICAL LYMPHOCYTES  Comment: MILD LEFT SHIFT (1-5% METAS, OCC MYELO, OCC BANDS)   Smear Review LARGE PLATELETS PRESENT   Comprehensive metabolic panel     Status: Abnormal   Collection Time: 04/18/15  1:56 PM  Result Value Ref Range   Sodium 140 135 - 145 mmol/L   Potassium 3.5 3.5 - 5.1 mmol/L   Chloride 103 101 - 111 mmol/L   CO2 21 (L) 22 - 32 mmol/L   Glucose, Bld 210 (H) 65 - 99 mg/dL   BUN 38 (H) 6 - 20 mg/dL   Creatinine, Ser 5.78 (H) 0.61 - 1.24 mg/dL   Calcium 9.4 8.9 - 10.3 mg/dL   Total Protein 4.7 (L) 6.5 - 8.1 g/dL   Albumin 2.8 (L) 3.5 - 5.0 g/dL   AST 45 (H) 15 - 41 U/L   ALT 20 17 - 63 U/L   Alkaline Phosphatase 55 38 - 126 U/L   Total Bilirubin 1.0 0.3 - 1.2 mg/dL   GFR calc non Af Amer 9 (L) >60 mL/min   GFR calc Af Amer 11 (L) >60 mL/min    Comment: (NOTE) The eGFR has been calculated using the CKD EPI equation. This calculation has not been validated in all clinical situations. eGFR's persistently <60 mL/min signify possible Chronic Kidney Disease.    Anion gap 16 (H) 5 - 15  APTT     Status: None   Collection Time: 04/18/15  1:56 PM  Result Value Ref Range   aPTT 27 24 - 37 seconds  Protime-INR     Status: Abnormal   Collection Time: 04/18/15  1:56 PM  Result Value Ref Range   Prothrombin Time 15.3 (H) 11.6 - 15.2 seconds   INR 1.19 0.00 - 1.49  Cortisol     Status: None   Collection Time: 04/18/15  1:56 PM  Result Value Ref Range   Cortisol, Plasma 57.8 ug/dL    Comment: (NOTE) AM    6.7 - 22.6 ug/dL PM   <10.0       ug/dL   Magnesium     Status: Abnormal   Collection Time: 04/18/15  1:56 PM  Result Value Ref Range   Magnesium  1.4 (L) 1.7 - 2.4 mg/dL  Phosphorus     Status: Abnormal   Collection Time: 04/18/15  1:56 PM  Result Value Ref Range   Phosphorus 2.0 (L) 2.5 - 4.6 mg/dL  Procalcitonin - Baseline     Status: None   Collection Time: 04/18/15  1:56 PM  Result Value Ref Range   Procalcitonin 78.80 ng/mL    Comment:        Interpretation: PCT >= 10 ng/mL: Important systemic inflammatory response, almost exclusively due to severe bacterial sepsis or septic shock. (NOTE)         ICU PCT Algorithm               Non ICU PCT Algorithm    ----------------------------     ------------------------------         PCT < 0.25 ng/mL                 PCT < 0.1 ng/mL     Stopping of antibiotics            Stopping of antibiotics       strongly encouraged.               strongly encouraged.    ----------------------------     ------------------------------       PCT  level decrease by               PCT < 0.25 ng/mL       >= 80% from peak PCT       OR PCT 0.25 - 0.5 ng/mL          Stopping of antibiotics                                             encouraged.     Stopping of antibiotics           encouraged.    ----------------------------     ------------------------------       PCT level decrease by              PCT >= 0.25 ng/mL       < 80% from peak PCT        AND PCT >= 0.5 ng/mL             Continuing antibiotics                                              encouraged.       Continuing antibiotics            encouraged.    ----------------------------     ------------------------------     PCT level increase compared          PCT > 0.5 ng/mL         with peak PCT AND          PCT >= 0.5 ng/mL             Escalation of antibiotics                                          strongly encouraged.      Escalation of antibiotics        strongly encouraged.   CBG monitoring, ED     Status: Abnormal   Collection Time: 04/18/15  2:29 PM  Result Value Ref Range   Glucose-Capillary 199 (H) 65 - 99 mg/dL  I-Stat  arterial blood gas, ED     Status: Abnormal   Collection Time: 04/18/15  2:49 PM  Result Value Ref Range   pH, Arterial 7.178 (LL) 7.350 - 7.450   pCO2 arterial 62.5 (HH) 35.0 - 45.0 mmHg   pO2, Arterial 54.0 (L) 80.0 - 100.0 mmHg   Bicarbonate 22.5 20.0 - 24.0 mEq/L   TCO2 24 0 - 100 mmol/L   O2 Saturation 71.0 %   Acid-base deficit 6.0 (H) 0.0 - 2.0 mmol/L   Patient temperature 102.8 F    Collection site BRACHIAL ARTERY    Drawn by Operator    Sample type ARTERIAL    Comment NOTIFIED PHYSICIAN   Pneumocystis smear by DFA     Status: None   Collection Time: 04/18/15  3:25 PM  Result Value Ref Range   Specimen [place] BRONCHIAL ALVEOLAR LAVAGE     Comment: CORRECTED ON 11/13 AT 1005: PREVIOUSLY REPORTED AS NONE   Pneumocystis jiroveci Ag NEGATIVE     Comment: PERFORMED AT [facility]  I-Stat CG4 Lactic Acid, ED  (not at  [facility])     Status: Abnormal   Collection Time: 04/18/15  3:32 PM  Result Value Ref Range   Lactic Acid, Venous 7.24 (HH) 0.5 - 2.0 mmol/L   Comment NOTIFIED PHYSICIAN   CBG monitoring, ED     Status: Abnormal   Collection Time: 04/18/15  3:55 PM  Result Value Ref Range   Glucose-Capillary 243 (H) 65 - 99 mg/dL  I-Stat arterial blood gas, ED     Status: Abnormal   Collection Time: 04/18/15  6:40 PM  Result Value Ref Range   pH, Arterial 7.265 (L) 7.350 - 7.450   pCO2 arterial 44.6 35.0 - 45.0 mmHg   pO2, Arterial 133.0 (H) 80.0 - 100.0 mmHg   Bicarbonate 19.8 (L) 20.0 - 24.0 mEq/L   TCO2 21 0 - 100 mmol/L   O2 Saturation 98.0 %   Acid-base deficit 6.0 (H) 0.0 - 2.0 mmol/L   Patient temperature 102.7 F    Collection site RADIAL, ALLEN'S TEST ACCEPTABLE    Drawn by Operator    Sample type ARTERIAL   CBG monitoring, ED     Status: Abnormal   Collection Time: 04/18/15  8:47 PM  Result Value Ref Range   Glucose-Capillary 152 (H) 65 - 99 mg/dL  Glucose, capillary     Status: Abnormal   Collection Time: 04/18/15 10:12 PM  Result Value Ref Range    Glucose-Capillary 161 (H) 65 - 99 mg/dL   Comment 1 Notify RN   MRSA PCR Screening     Status: None   Collection Time: 04/18/15 10:19 PM  Result Value Ref Range   MRSA by PCR NEGATIVE NEGATIVE    Comment:        The GeneXpert MRSA Assay (FDA approved for NASAL specimens only), is one component of a comprehensive MRSA colonization surveillance program. It is not intended to diagnose MRSA infection nor to guide or monitor treatment for MRSA infections.   Glucose, capillary     Status: Abnormal   Collection Time: 04/18/15 11:24 PM  Result Value Ref Range   Glucose-Capillary 158 (H) 65 - 99 mg/dL  I-STAT 3, arterial blood gas (G3+)     Status: Abnormal   Collection Time: 04/19/15  1:01 AM  Result Value Ref Range   pH, Arterial 7.320 (L) 7.350 - 7.450   pCO2 arterial 41.6 35.0 - 45.0 mmHg   pO2, Arterial 84.0 80.0 - 100.0 mmHg   Bicarbonate 21.4 20.0 - 24.0 mEq/L   TCO2 23 0 - 100 mmol/L   O2 Saturation 95.0 %   Acid-base deficit 4.0 (H) 0.0 - 2.0 mmol/L   Patient temperature 99.3 F    Collection site RADIAL, ALLEN'S TEST ACCEPTABLE    Drawn by RT    Sample type ARTERIAL   Glucose, capillary     Status: None   Collection Time: 04/19/15  4:24 AM  Result Value Ref Range   Glucose-Capillary 96 65 - 99 mg/dL  CBC     Status: Abnormal   Collection Time: 04/19/15  4:26 AM  Result Value Ref Range   WBC 5.5 4.0 - 10.5 K/uL   RBC 3.64 (L) 4.22 - 5.81 MIL/uL   Hemoglobin 11.0 (L) 13.0 - 17.0 g/dL   HCT 36.1 (L) 39.0 - 52.0 %   MCV 99.2 78.0 - 100.0 fL   MCH 30.2 26.0 - 34.0 pg   MCHC 30.5 30.0 - 36.0 g/dL   RDW 15.9 (H) 11.5 -  15.5 %   Platelets 157 150 - 400 K/uL  Basic metabolic panel     Status: Abnormal   Collection Time: 04/19/15  4:26 AM  Result Value Ref Range   Sodium 134 (L) 135 - 145 mmol/L   Potassium 5.4 (H) 3.5 - 5.1 mmol/L    Comment: DELTA CHECK NOTED NO VISIBLE HEMOLYSIS    Chloride 99 (L) 101 - 111 mmol/L   CO2 22 22 - 32 mmol/L   Glucose, Bld 100 (H) 65  - 99 mg/dL   BUN 48 (H) 6 - 20 mg/dL   Creatinine, Ser 6.32 (H) 0.61 - 1.24 mg/dL   Calcium 8.7 (L) 8.9 - 10.3 mg/dL   GFR calc non Af Amer 8 (L) >60 mL/min   GFR calc Af Amer 10 (L) >60 mL/min    Comment: (NOTE) The eGFR has been calculated using the CKD EPI equation. This calculation has not been validated in all clinical situations. eGFR's persistently <60 mL/min signify possible Chronic Kidney Disease.    Anion gap 13 5 - 15  Magnesium     Status: Abnormal   Collection Time: 04/19/15  4:26 AM  Result Value Ref Range   Magnesium 1.2 (L) 1.7 - 2.4 mg/dL  Phosphorus     Status: None   Collection Time: 04/19/15  4:26 AM  Result Value Ref Range   Phosphorus 3.7 2.5 - 4.6 mg/dL  Procalcitonin     Status: None   Collection Time: 04/19/15  4:26 AM  Result Value Ref Range   Procalcitonin >200.00 ng/mL    Comment:        Interpretation: PCT >= 10 ng/mL: Important systemic inflammatory response, almost exclusively due to severe bacterial sepsis or septic shock. (NOTE)         ICU PCT Algorithm               Non ICU PCT Algorithm    ----------------------------     ------------------------------         PCT < 0.25 ng/mL                 PCT < 0.1 ng/mL     Stopping of antibiotics            Stopping of antibiotics       strongly encouraged.               strongly encouraged.    ----------------------------     ------------------------------       PCT level decrease by               PCT < 0.25 ng/mL       >= 80% from peak PCT       OR PCT 0.25 - 0.5 ng/mL          Stopping of antibiotics                                             encouraged.     Stopping of antibiotics           encouraged.    ----------------------------     ------------------------------       PCT level decrease by              PCT >= 0.25 ng/mL       < 80% from peak PCT  AND PCT >= 0.5 ng/mL             Continuing antibiotics                                              encouraged.       Continuing  antibiotics            encouraged.    ----------------------------     ------------------------------     PCT level increase compared          PCT > 0.5 ng/mL         with peak PCT AND          PCT >= 0.5 ng/mL             Escalation of antibiotics                                          strongly encouraged.      Escalation of antibiotics        strongly encouraged.   Blood gas, arterial     Status: Abnormal   Collection Time: 04/19/15  4:50 AM  Result Value Ref Range   FIO2 0.60    Delivery systems VENTILATOR    Mode PRESSURE REGULATED VOLUME CONTROL    VT 380 mL   LHR 35 resp/min   Peep/cpap 12.0 cm H20   pH, Arterial 7.299 (L) 7.350 - 7.450   pCO2 arterial 40.5 35.0 - 45.0 mmHg   pO2, Arterial 99.3 80.0 - 100.0 mmHg   Bicarbonate 19.3 (L) 20.0 - 24.0 mEq/L   TCO2 20.5 0 - 100 mmol/L   Acid-base deficit 6.0 (H) 0.0 - 2.0 mmol/L   Patient temperature 98.4    Collection site RIGHT RADIAL    Drawn by [phone]    Sample type ARTERIAL DRAW    Allens test (pass/fail) PASS PASS  Glucose, capillary     Status: None   Collection Time: 04/19/15  8:14 AM  Result Value Ref Range   Glucose-Capillary 88 65 - 99 mg/dL  Renal function panel     Status: Abnormal   Collection Time: 04/19/15  8:48 AM  Result Value Ref Range   Sodium 134 (L) 135 - 145 mmol/L   Potassium 5.9 (H) 3.5 - 5.1 mmol/L   Chloride 100 (L) 101 - 111 mmol/L   CO2 21 (L) 22 - 32 mmol/L   Glucose, Bld 94 65 - 99 mg/dL   BUN 51 (H) 6 - 20 mg/dL   Creatinine, Ser 6.74 (H) 0.61 - 1.24 mg/dL   Calcium 8.5 (L) 8.9 - 10.3 mg/dL   Phosphorus 4.4 2.5 - 4.6 mg/dL   Albumin 2.5 (L) 3.5 - 5.0 g/dL   GFR calc non Af Amer 8 (L) >60 mL/min   GFR calc Af Amer 9 (L) >60 mL/min    Comment: (NOTE) The eGFR has been calculated using the CKD EPI equation. This calculation has not been validated in all clinical situations. eGFR's persistently <60 mL/min signify possible Chronic Kidney Disease.    Anion gap 13 5 - 15      ROS:  Review of systems not obtained due to patient factors.  Physical Exam: Filed Vitals:   04/19/15 1045  BP: 105/74  Pulse: 101  Temp:   Resp: 35     General: alert on vent HEENT: PERRLA, EOMI, mucous membranes moist Neck: no JVD Heart: tachy Lungs: CBS bilat Abdomen: soft, non tender Extremities: no edema- no perm access Skin: warm and dry Neuro: alert   Assessment/Plan:64 year old BM with many medical issues including s/p bilateral lung transplants and dialysis dependent renal failure who is now admitted for sepsis and respiratory failure with PNA 1.Renal- has been dialysis dependent since June- HD q MWF at [facility], last done Friday.  Now with hyperkalemia and hemodynamic instability so need to initiate CRRT for support.  Would likely need to run overnight to get K down and be ready for transfer to [place] on 11/14 2. Hypertension/volume  - hypotensive on pressors.  Does not seem volume overloaded- run even with CRRT - could consider giving him some volume ?  3. S/p lung tx - at home on cyclosporine and cellcept- these will need to be restarted.  Vent per CCM 4. ID- PNA- on brad spectrum abx with multiple cultures pending.  Would need to consider PC related sepsis as well even though catheter looks OK at this time 5. Hyperkalemia- intiating CRRT with post filter bicarb- should bring k down fairly quickly- likely will need to change to all 4K dialysate after tomorrows labs   Carlus Stay A 04/19/2015, 11:11 AM

## 2015-04-19 NOTE — Progress Notes (Signed)
Patient handed off to [facility] for transport. Report provided. Family left with pleasant disposition, very appreciative of the care received today.

## 2015-04-19 NOTE — Progress Notes (Signed)
PULMONARY / CRITICAL CARE MEDICINE   Name: [name] MRN: [id number] DOB: Sep 10, 1950    ADMISSION DATE:  04/18/2015 CONSULTATION DATE:  04/18/2015  REFERRING MD :  EDP  CHIEF COMPLAINT:  Septic shock and respiratory failure, RLL PNA  INITIAL PRESENTATION: 64 year old male s/p bilateral lung transplant in [facility] earlier this year who was discharged in august 2016.  Also history of ESRD on HD.  Was in normal state of health until 11/11 when he noticed some SOB and fatigue.  He came to the ED where he was diagnosed with RLL PNA associated with septic shock and respiratory failure.  History from wife as patient is altered.  Denies cough but reports fever and chill.  STUDIES:  CXR 11/11 RLL infiltrate.  SIGNIFICANT EVENTS: 11/11 acute respiratory failure requiring intubation.   HISTORY OF PRESENT ILLNESS:  64 year old male s/p bilateral lung transplant in [facility] earlier this year who was discharged in august 2016.  Also history of ESRD on HD.  Was in normal state of health until 11/11 when he noticed some SOB and fatigue.  He came to the ED where he was diagnosed with RLL PNA associated with septic shock and respiratory failure.  History from wife as patient is altered.  Denies cough but reports fever and chill.  PAST MEDICAL HISTORY :   has a past medical history of Benign neoplasm of colon; Aortic valve disorders; Osteoarthrosis, unspecified whether generalized or localized, unspecified site; Degeneration of intervertebral disc, site unspecified; Other and unspecified hyperlipidemia; Unspecified essential hypertension; Obstructive sleep apnea (adult) (pediatric); Esophageal reflux; Diaphragmatic hernia without mention of obstruction or gangrene; Pneumonia; Diabetes mellitus without complication ([place]); Renal disorder; and Transplanted, lung ([place]).  has past surgical history that includes Lung biopsy (2010) and Lung transplant, single (Right). Prior to Admission medications   Medication  Sig Start Date End Date Taking? Authorizing Provider  cycloSPORINE modified (NEORAL) 100 MG capsule Take 1 capsule by mouth daily. 03/09/15 04/08/16 Yes Historical Provider, MD  insulin regular (NOVOLIN R,HUMULIN R) 100 units/mL injection Inject 8 Units into the skin 3 (three) times daily before meals. 8 units injected prior to breakfast 6 units injected prior to lunch 10 units injected prior to dinner 03/09/15  Yes Historical Provider, MD  amLODipine-valsartan (EXFORGE) 5-160 MG per tablet Take 1 tablet by mouth daily. 03/05/13   Wenda Low, MD  aspirin 81 MG tablet Take 81 mg by mouth daily.     Historical Provider, MD  azaTHIOprine (IMURAN) 50 MG tablet take 3 tablets by mouth once daily 11/26/13   Kathee Delton, MD  Calcium Carbonate-Vit D-Min (CALTRATE 600+D PLUS PO) Take by mouth daily.    Historical Provider, MD  fluticasone (FLONASE) 50 MCG/ACT nasal spray Place 2 sprays into the nose daily. Patient taking differently: Place 2 sprays into the nose as needed.  03/05/13   Wenda Low, MD  Insulin Detemir (LEVEMIR FLEXPEN [place]) Inject 25-35 units per day per instructions 07/22/14   Historical Provider, MD  Multiple Vitamin (MULTIVITAMIN) capsule Take 1 capsule by mouth daily.     Historical Provider, MD  potassium chloride (K-DUR,KLOR-CON) 10 MEQ tablet Take 1 tablet (10 mEq total) by mouth daily. 03/05/13   Wenda Low, MD  predniSONE (DELTASONE) 20 MG tablet take 1 tablet by mouth once daily 10/29/14   Kathee Delton, MD  predniSONE (DELTASONE) 5 MG tablet Take 3.5 tablets daily as directed Patient taking differently: 17 mg daily 12/02/13   Kathee Delton, MD  sulfamethoxazole-trimethoprim (BACTRIM DS,SEPTRA DS) 800-160 MG per tablet Take 1 tablet by mouth daily. 05/26/14   Kathee Delton, MD  valGANciclovir (VALCYTE) 450 MG tablet Take 1 tablet by mouth every Monday. Patient takes one tablet every Monday and Friday after dialysis 03/26/15   Historical Provider, MD   Allergies  Allergen  Reactions  . Levofloxacin     LOSS OF CONSCIOUSNESS  . Nsaids     Patient instructed not to take NSAID's after his lung transplant    FAMILY HISTORY:  has no family status information on file.  SOCIAL HISTORY:  reports that he quit smoking about 12 years ago. His smoking use included Cigarettes. He has a 6 pack-year smoking history. He has never used smokeless tobacco. He reports that he does not drink alcohol or use illicit drugs.  REVIEW OF SYSTEMS:  Unattainable, patient is altered.  SUBJECTIVE:   VITAL SIGNS: Temp:  [98.4 F (36.9 C)-99.3 F (37.4 C)] 98.7 F (37.1 C) (11/13 1235) Pulse Rate:  [57-128] 101 (11/13 1045) Resp:  [16-42] 35 (11/13 1045) BP: (57-147)/(40-97) 105/74 mmHg (11/13 1045) SpO2:  [93 %-100 %] 97 % (11/13 1045) FiO2 (%):  [50 %-100 %] 50 % (11/13 1246) Weight:  [65.5 kg (144 lb 6.4 oz)] 65.5 kg (144 lb 6.4 oz) (11/12 2300) HEMODYNAMICS: CVP:  [10 mmHg-14 mmHg] 10 mmHg VENTILATOR SETTINGS: Vent Mode:  [-] PRVC FiO2 (%):  [50 %-100 %] 50 % Set Rate:  [24 bmp-35 bmp] 35 bmp Vt Set:  [380 mL-600 mL] 380 mL PEEP:  [10 [id number] cmH20] 10 cmH20 Plateau Pressure:  [29 JYN82-95 cmH20] 32 cmH20 INTAKE / OUTPUT:  Intake/Output Summary (Last 24 hours) at 04/19/15 1313 Last data filed at 04/19/15 0800  Gross per 24 hour  Intake 1634.03 ml  Output      0 ml  Net 1634.03 ml    PHYSICAL EXAMINATION: General:  Acute on chronically appearing AAM sedated on vent. Awakens and f/c  Neuro:  Arousable and following commands on all 4 ext. Follows commands  HEENT:  [place]/AT, PERRL, EOM-I and DMM. Cardiovascular:  RRR, Nl S1/S2, -M/R/G.. Lungs:  Diffuse crackles. No accessory muscle use  Abdomen:  Soft, NT, ND and +BS. Musculoskeletal:  -edema and -tenderness. Skin:  Multiple scars on the right upper neck from previous access.  Tunneled R IJ HD catheter.  LABS:  CBC  Recent Labs Lab 04/18/15 1030 04/18/15 1149 04/18/15 1356 04/19/15 0426  WBC 2.7*  --   4.3 5.5  HGB 11.9* 13.6 11.0* 11.0*  HCT 38.0* 40.0 35.6* 36.1*  PLT 201  --  214 157   Coag's  Recent Labs Lab 04/18/15 1356  APTT 27  INR 1.19   BMET  Recent Labs Lab 04/18/15 1356 04/19/15 0426 04/19/15 0848  NA 140 134* 134*  K 3.5 5.4* 5.9*  CL 103 99* 100*  CO2 21* 22 21*  BUN 38* 48* 51*  CREATININE 5.78* 6.32* 6.74*  GLUCOSE 210* 100* 94   Electrolytes  Recent Labs Lab 04/18/15 1356 04/19/15 0426 04/19/15 0848  CALCIUM 9.4 8.7* 8.5*  MG 1.4* 1.2*  --   PHOS 2.0* 3.7 4.4   Sepsis Markers  Recent Labs Lab 04/18/15 1044 04/18/15 1356 04/18/15 1532 04/19/15 0426  LATICACIDVEN 6.44*  --  7.24*  --   PROCALCITON  --  78.80  --  >200.00   ABG  Recent Labs Lab 04/18/15 1840 04/19/15 0101 04/19/15 0450  PHART 7.265* 7.320* 7.299*  PCO2ART 44.6 41.6 40.5  PO2ART 133.0* 84.0 99.3   Liver Enzymes  Recent Labs Lab 04/18/15 1030 04/18/15 1356 04/19/15 0848  AST 31 45*  --   ALT 14* 20  --   ALKPHOS 60 55  --   BILITOT 0.5 1.0  --   ALBUMIN 3.3* 2.8* 2.5*   Cardiac Enzymes No results for input(s): TROPONINI, PROBNP in the last 168 hours. Glucose  Recent Labs Lab 04/18/15 2047 04/18/15 2212 04/18/15 2324 04/19/15 0424 04/19/15 0814 04/19/15 1237  GLUCAP 152* 161* 158* 96 88 90    Imaging Dg Chest Port 1 View  04/19/2015  CLINICAL DATA:  Respiratory failure.  Shock. EXAM: PORTABLE CHEST 1 VIEW COMPARISON:  Yesterday FINDINGS: Endotracheal tube tip in good position between the clavicular heads and carina. Unchanged positioning of left IJ catheter, tip ascending in the right brachiocephalic vein. Tunneled catheter on the right is in good position with tip at the SVC level. Orogastric tube tip at least reaches the stomach. Partial clearing of right lung opacity, suspected pneumonia given history septic shock. There is chronic blunting of the right lateral costophrenic sulcus with possible superimposed small effusion. The right lung is  more lucent than on previous imaging, presumably hyperinflation of the remaining lung given there are postoperative changes at the right hilum. Diffuse interstitial coarsening of the left lung in this patient of pulmonary fibrosis. Normal heart size. No visible pneumothorax. IMPRESSION: 1. Unchanged positioning of tubes and lines, including left IJ catheter with tip at the right brachiocephalic vein. 2. Partial clearing of right basilar opacity, suspected pneumonia. 3. Pulmonary fibrosis and postoperative changes on the right. Electronically Signed   By: Monte Fantasia M.D.   On: 04/19/2015 06:02   Dg Chest Port 1 View  04/18/2015  ADDENDUM REPORT: 04/18/2015 14:03 ADDENDUM: Critical Value/emergent results were discussed with the patient's provider Jennet Maduro on 04/18/2015 at time 2:03 pm. Electronically Signed   By: Nolon Nations M.D.   On: 04/18/2015 14:03  04/18/2015  CLINICAL DATA:  Pt intubated w/ central line AND OG tube EXAM: PORTABLE CHEST 1 VIEW COMPARISON:  04/18/2015 FINDINGS: Endotracheal tube is in place with tip 3.6 cm above carina. Right dialysis catheter tips overlie the lower pole of the lower superior vena cava. Nasogastric tube tip is off the film and beyond the gastroesophageal junction. Left IJ central line tip is redirected cephalad likely in the proximal right subclavian vein. Heart is not normal in size. There patchy densities within the lungs. More confluent opacity is noted at the left lung base compared prior study. There is no pneumothorax. IMPRESSION: 1. Interval placement of endotracheal tube, nasogastric tube, and left IJ central line. 2. IJ line crosses midline and courses cephalad.  See above. 3. Increased infiltrate at the left lung base. Critical Value/emergent results will be telephoned to the patient's provider Jennet Maduro at the time of interpretation on date 04/18/2015 at time 1:56 pm. Electronically Signed: By: Nolon Nations M.D. On: 04/18/2015 13:56   Dg Abd  Portable 1v  04/18/2015  CLINICAL DATA:  Bowel perforation. EXAM: PORTABLE ABDOMEN - 1 VIEW COMPARISON:  Chest x-ray same day. FINDINGS: There appears to be an enteric tube with tip overlying the stomach. Right-sided decubitus film demonstrates no evidence of free peritoneal air. No dilated small bowel loops. Few air-fluid levels over the left upper quadrant and left abdomen. Remainder of the exam is unchanged. IMPRESSION: Nonobstructive bowel gas pattern.  No free peritoneal air. Electronically Signed   By: Marin Olp M.D.  On: 04/18/2015 14:00     ASSESSMENT / PLAN:  PULMONARY OETT 11/12>>> A: RLL PNA Bilateral lung transplant patient. P:   - Full vent support. - F/U ABG and CXR. - Abx as below. - Transfer to [facility]. - f/u FOB  CARDIOVASCULAR CVL L IJ TLC 11/12>>> A: Septic shock due to RLL pneumonia. P:  - Levophed and vasopressin for shock  - Stress dose steroids. - f/u 2d echo.  RENAL A:  ESRD-HD       Hyperkalemia  P:    - KVO IVF. - CRRT - Insulin IV  - BMET in AM. - Replace electrolytes as indicated.  GASTROINTESTINAL A:  No active issues. P:   - OGT. - Consult nutrition for TF in AM.  HEMATOLOGIC A:  Leukopenia P:  - CBC in AM. - Check diff for absolute neutropenic time.  INFECTIOUS A:  RLL PNA. P:   BCx2 11/12>>> UC 11/12>>> Sputum 11/12>>>  Abx:  Vancomycin 11/12>>> Zosyn 11/12>>> Zithromax 11/12>>> Eraxis 11/12>>>  ENDOCRINE A:   DM Chronic steroids  P:   . - Stress dose steroids. - ISS. - CBG.  NEUROLOGIC A:  Lethargic due to sepsis. P:   RASS goal: 0 - Fentanyl drip - Versed PRN   FAMILY  - Updates: Wife updated bedside.  Full code status.  Will go to [facility] today.   Erick Colace ACNP-BC [place] Pager # [phone] OR # [phone] if no answer  Attending Note:  64 year old male s/p bilateral lung transplant with ESRD-HD presenting with VDRF due to RLL infiltrate.   Developed septic shock.  With support this AM patient was more stable but K was rising.  CRRT started and patient given the medical therapy for hyperkalemia.  Communicated with [facility] transplant fellow, they are aware that patient has a high K and will start CRRT there.  Continue titration for levophed.  Upon arrival of transport will transport to [place].  The patient is critically ill with multiple organ systems failure and requires high complexity decision making for assessment and support, frequent evaluation and titration of therapies, application of advanced monitoring technologies and extensive interpretation of multiple databases.   Critical Care Time devoted to patient care services described in this note is  35  Minutes. This time reflects time of care of this signee Dr Jennet Maduro. This critical care time does not reflect procedure time, or teaching time or supervisory time of PA/NP/Med student/Med Resident etc but could involve care discussion time.  Rush Farmer, M.D. [facility] Pulmonary/Critical Care Medicine. Pager: [phone]. After hours pager: [phone].  04/19/2015, 1:13 PM

## 2015-04-19 NOTE — Progress Notes (Signed)
I went out to the waiting room and had a lengthy discussion with patient's wife. She expressed deep concern and frustration with her husband's care. At this time we are waiting for a bed to become available at [facility].  I attempted to empathize, console and provide emotional support to her.  I assured her that her husband would receive the best care possible and that as soon as a bed is available at [facility] I would ensure a speedy transfer process.  She seemed relieved at this assurance I provided.  She expressed anger at this time concerning the care and support she has received thus far at [facility]. I again re-assured her that the care her husband is receiving is, and will remain the best I can offer.  Will advise MD of family's deep concern about transferring to [facility].

## 2015-04-19 NOTE — Progress Notes (Signed)
ANTIBIOTIC CONSULT NOTE - INITIAL  Pharmacy Consult for vancomycin and Zosyn Indication: rule out pneumonia  Allergies  Allergen Reactions  . Levofloxacin     LOSS OF CONSCIOUSNESS  . Nsaids     Patient instructed not to take NSAID's after his lung transplant    Patient Measurements: Height: '5\' 6"'$  (167.6 cm) Weight: 144 lb 6.4 oz (65.5 kg) IBW/kg (Calculated) : 63.8   Vital Signs: Temp: 98.7 F (37.1 C) (11/13 1235) Temp Source: Oral (11/13 1235) BP: 105/74 mmHg (11/13 1045) Pulse Rate: 101 (11/13 1045) Intake/Output from previous day: 11/12 0701 - 11/13 0700 In: 2834.3 [I.V.:2784.3; IV Piggyback:50] Out: -  Intake/Output from this shift: Total I/O In: 49.7 [I.V.:49.7] Out: -   Labs:  Recent Labs  04/18/15 1030 04/18/15 1149 04/18/15 1356 04/19/15 0426 04/19/15 0848  WBC 2.7*  --  4.3 5.5  --   HGB 11.9* 13.6 11.0* 11.0*  --   PLT 201  --  214 157  --   CREATININE 5.68* 5.10* 5.78* 6.32* 6.74*   Estimated Creatinine Clearance: 9.7 mL/min (by C-G formula based on Cr of 6.74). No results for input(s): VANCOTROUGH, VANCOPEAK, VANCORANDOM, GENTTROUGH, GENTPEAK, GENTRANDOM, TOBRATROUGH, TOBRAPEAK, TOBRARND, AMIKACINPEAK, AMIKACINTROU, AMIKACIN in the last 72 hours.   Microbiology: Recent Results (from the past 720 hour(s))  Blood Culture (routine x 2)     Status: None (Preliminary result)   Collection Time: 04/18/15 10:30 AM  Result Value Ref Range Status   Specimen Description BLOOD RIGHT HAND  Final   Special Requests BOTTLES DRAWN AEROBIC AND ANAEROBIC 5CC  Final   Culture NO GROWTH 1 DAY  Final   Report Status PENDING  Incomplete  Blood Culture (routine x 2)     Status: None (Preliminary result)   Collection Time: 04/18/15 10:30 AM  Result Value Ref Range Status   Specimen Description BLOOD RIGHT ANTECUBITAL  Final   Special Requests BOTTLES DRAWN AEROBIC AND ANAEROBIC 10CC  Final   Culture NO GROWTH 1 DAY  Final   Report Status PENDING  Incomplete   Pneumocystis smear by DFA     Status: None   Collection Time: 04/18/15  3:25 PM  Result Value Ref Range Status   Specimen Source-PJSRC BRONCHIAL ALVEOLAR LAVAGE  Corrected    Comment: CORRECTED ON 11/13 AT 1005: PREVIOUSLY REPORTED AS NONE   Pneumocystis jiroveci Ag NEGATIVE  Final    Comment: PERFORMED AT [facility]  MRSA PCR Screening     Status: None   Collection Time: 04/18/15 10:19 PM  Result Value Ref Range Status   MRSA by PCR NEGATIVE NEGATIVE Final    Comment:        The GeneXpert MRSA Assay (FDA approved for NASAL specimens only), is one component of a comprehensive MRSA colonization surveillance program. It is not intended to diagnose MRSA infection nor to guide or monitor treatment for MRSA infections.     Medical History: Past Medical History  Diagnosis Date  . Benign neoplasm of colon   . Aortic valve disorders   . Osteoarthrosis, unspecified whether generalized or localized, unspecified site   . Degeneration of intervertebral disc, site unspecified   . Other and unspecified hyperlipidemia   . Unspecified essential hypertension   . Obstructive sleep apnea (adult) (pediatric)   . Esophageal reflux   . 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 Pneumonia     interstitial pneumonia  . Diabetes mellitus without complication ([place])   . Renal disorder   .  Transplanted, lung ([place])     Medications:  Prescriptions prior to admission  Medication Sig Dispense Refill Last Dose  . acetaminophen (TYLENOL) 500 MG tablet Take 500 mg by mouth every 6 (six) hours as needed for fever or headache.   04/17/2015 at Unknown time  . aspirin 81 MG tablet Take 81 mg by mouth daily.    04/17/2015 at Unknown time  . calcium acetate (PHOSLO) 667 MG capsule Take 667 mg by mouth 3 (three) times daily with meals.   04/17/2015 at Unknown time  . Calcium Carbonate-Vit D-Min (CALTRATE 600+D PLUS PO) Take by mouth daily.   04/17/2015 at Unknown time  .  Calcium Citrate-Vitamin D (CITRACAL/VITAMIN D PO) Take 400 mg by mouth 2 (two) times daily.   04/17/2015 at Unknown time  . clonazePAM (KLONOPIN) 0.5 MG tablet Take 0.25 mg by mouth as needed for anxiety (shaking).   04/17/2015 at Unknown time  . cycloSPORINE modified (NEORAL) 100 MG capsule Take 1 capsule by mouth daily.   04/17/2015 at Unknown time  . fluticasone (FLONASE) 50 MCG/ACT nasal spray Place 2 sprays into the nose daily. (Patient taking differently: Place 2 sprays into the nose as needed. ) 30 g 2 Taking  . gabapentin (NEURONTIN) 100 MG capsule Take 100 mg by mouth 2 (two) times daily.   04/17/2015 at Unknown time  . insulin regular (NOVOLIN R,HUMULIN R) 100 units/mL injection Inject 8 Units into the skin 3 (three) times daily before meals. 8 units injected prior to breakfast 6 units injected prior to lunch 10 units injected prior to dinner   04/17/2015 at Unknown time  . midodrine (PROAMATINE) 5 MG tablet Take 5 mg by mouth 2 (two) times daily.   04/17/2015 at Unknown time  . Multiple Vitamin (MULTIVITAMIN) capsule Take 1 capsule by mouth daily.    04/17/2015 at Unknown time  . mycophenolate (CELLCEPT) 250 MG capsule Take 1,000 mg by mouth 2 (two) times daily.   04/17/2015 at Unknown time  . omeprazole (PRILOSEC) 20 MG capsule Take 20 mg by mouth daily.   04/17/2015 at Unknown time  . ondansetron (ZOFRAN) 4 MG tablet Take 4 mg by mouth every 8 (eight) hours as needed for nausea or vomiting.   04/17/2015 at Unknown time  . pravastatin (PRAVACHOL) 20 MG tablet Take 20 mg by mouth daily.   04/17/2015 at Unknown time  . predniSONE (DELTASONE) 5 MG tablet Take 3.5 tablets daily as directed (Patient taking differently: 15 mg. 3 tablets by mouth daily) 90 tablet 0 04/17/2015 at Unknown time  . sertraline (ZOLOFT) 50 MG tablet Take 50 mg by mouth daily.   04/17/2015 at Unknown time  . sulfamethoxazole-trimethoprim (BACTRIM DS,SEPTRA DS) 800-160 MG per tablet Take 1 tablet by mouth daily. (Patient  taking differently: Take 1 tablet by mouth daily. Take one tablet by mouth on mondays, wednesdays and fridays) 30 tablet 5 04/17/2015 at Unknown time  . valGANciclovir (VALCYTE) 450 MG tablet Take 1 tablet by mouth every Monday. Patient takes one tablet every Monday and Friday after dialysis   04/17/2015 at Unknown time  . amLODipine-valsartan (EXFORGE) 5-160 MG per tablet Take 1 tablet by mouth daily. 30 tablet 6 Taking  . azaTHIOprine (IMURAN) 50 MG tablet take 3 tablets by mouth once daily 90 tablet 6 Taking  . Insulin Detemir (LEVEMIR FLEXPEN [place]) Inject 25-35 units per day per instructions   Taking  . potassium chloride (K-DUR,KLOR-CON) 10 MEQ tablet Take 1 tablet (10 mEq total) by mouth daily. 30 tablet  3 Taking  . predniSONE (DELTASONE) 20 MG tablet take 1 tablet by mouth once daily 30 tablet 3    Assessment: 64 yo man admitted 04/18/2015 for sepsis possibly d/t pna. Presented with weakness & shivering, RR 40s started on NRB in ED. Tachycardic to 130s, BP 66/40. Of note ESRD pt, last HD session on 11/11. The plan was to tx to [facility] for further management but that has put on hold for now. CRRT is to be used here to address hemodialysis needs. Going to change vanc/zosyn dose  PMH OA, HLD, HTN, OSA, DM, lung transplant (03/2015), ESRD on HD MWF  ID Day #2 abx for pna/sepsis   11/12 vanc >>  11/12 Zosyn >>  11/12 blood >> 11/12 urine >>  Goal of Therapy:  Vancomycin trough level 15-20 mcg/ml  Plan:   Change vanc to '750mg'$  IV q24 start late tonight Change zosyn to 3.375g IV q6 F/u with CRRT for further dosing  Onnie Boer, PharmD Pager: [phone] 04/19/2015 1:07 PM

## 2015-04-19 NOTE — Progress Notes (Signed)
CRRT therapy began, ran for 10 mins and stopped for transport as per Dr. Nelda Marseille.

## 2015-04-19 NOTE — Progress Notes (Signed)
Wakeup assessment deferred due to patient instability and pending transfer to [facility]. Will continue to monitor very closely.

## 2015-04-20 DIAGNOSIS — J209 Acute bronchitis, unspecified: Secondary | ICD-10-CM | POA: Diagnosis not present

## 2015-04-20 LAB — GLUCOSE, CAPILLARY
Glucose-Capillary: 175 mg/dL — ABNORMAL HIGH (ref 65–99)
Glucose-Capillary: 172 mg/dL — ABNORMAL HIGH (ref 65–99)

## 2015-04-22 LAB — CULTURE, BAL-QUANTITATIVE

## 2015-04-22 LAB — CULTURE, BAL-QUANTITATIVE W GRAM STAIN

## 2015-04-23 LAB — CULTURE, BLOOD (ROUTINE X 2)
CULTURE: NO GROWTH
Culture: NO GROWTH

## 2015-04-29 DIAGNOSIS — Z992 Dependence on renal dialysis: Secondary | ICD-10-CM | POA: Diagnosis not present

## 2015-04-29 DIAGNOSIS — R600 Localized edema: Secondary | ICD-10-CM | POA: Diagnosis not present

## 2015-04-29 DIAGNOSIS — D631 Anemia in chronic kidney disease: Secondary | ICD-10-CM | POA: Diagnosis not present

## 2015-04-29 DIAGNOSIS — E1122 Type 2 diabetes mellitus with diabetic chronic kidney disease: Secondary | ICD-10-CM | POA: Diagnosis not present

## 2015-04-29 DIAGNOSIS — I120 Hypertensive chronic kidney disease with stage 5 chronic kidney disease or end stage renal disease: Secondary | ICD-10-CM | POA: Diagnosis not present

## 2015-04-29 DIAGNOSIS — N186 End stage renal disease: Secondary | ICD-10-CM | POA: Diagnosis not present

## 2015-05-01 DIAGNOSIS — R600 Localized edema: Secondary | ICD-10-CM | POA: Diagnosis not present

## 2015-05-01 DIAGNOSIS — Z992 Dependence on renal dialysis: Secondary | ICD-10-CM | POA: Diagnosis not present

## 2015-05-01 DIAGNOSIS — I120 Hypertensive chronic kidney disease with stage 5 chronic kidney disease or end stage renal disease: Secondary | ICD-10-CM | POA: Diagnosis not present

## 2015-05-01 DIAGNOSIS — E1122 Type 2 diabetes mellitus with diabetic chronic kidney disease: Secondary | ICD-10-CM | POA: Diagnosis not present

## 2015-05-01 DIAGNOSIS — N186 End stage renal disease: Secondary | ICD-10-CM | POA: Diagnosis not present

## 2015-05-01 DIAGNOSIS — D631 Anemia in chronic kidney disease: Secondary | ICD-10-CM | POA: Diagnosis not present

## 2015-05-04 DIAGNOSIS — R600 Localized edema: Secondary | ICD-10-CM | POA: Diagnosis not present

## 2015-05-04 DIAGNOSIS — Z992 Dependence on renal dialysis: Secondary | ICD-10-CM | POA: Diagnosis not present

## 2015-05-04 DIAGNOSIS — E1122 Type 2 diabetes mellitus with diabetic chronic kidney disease: Secondary | ICD-10-CM | POA: Diagnosis not present

## 2015-05-04 DIAGNOSIS — N186 End stage renal disease: Secondary | ICD-10-CM | POA: Diagnosis not present

## 2015-05-04 DIAGNOSIS — D631 Anemia in chronic kidney disease: Secondary | ICD-10-CM | POA: Diagnosis not present

## 2015-05-04 DIAGNOSIS — I120 Hypertensive chronic kidney disease with stage 5 chronic kidney disease or end stage renal disease: Secondary | ICD-10-CM | POA: Diagnosis not present

## 2015-05-05 DIAGNOSIS — R262 Difficulty in walking, not elsewhere classified: Secondary | ICD-10-CM | POA: Diagnosis not present

## 2015-05-06 DIAGNOSIS — D631 Anemia in chronic kidney disease: Secondary | ICD-10-CM | POA: Diagnosis not present

## 2015-05-06 DIAGNOSIS — N186 End stage renal disease: Secondary | ICD-10-CM | POA: Diagnosis not present

## 2015-05-06 DIAGNOSIS — I120 Hypertensive chronic kidney disease with stage 5 chronic kidney disease or end stage renal disease: Secondary | ICD-10-CM | POA: Diagnosis not present

## 2015-05-06 DIAGNOSIS — R600 Localized edema: Secondary | ICD-10-CM | POA: Diagnosis not present

## 2015-05-06 DIAGNOSIS — E1122 Type 2 diabetes mellitus with diabetic chronic kidney disease: Secondary | ICD-10-CM | POA: Diagnosis not present

## 2015-05-06 DIAGNOSIS — Z992 Dependence on renal dialysis: Secondary | ICD-10-CM | POA: Diagnosis not present

## 2015-05-06 LAB — LEGIONELLA CULTURE

## 2015-05-07 DIAGNOSIS — N186 End stage renal disease: Secondary | ICD-10-CM | POA: Diagnosis not present

## 2015-05-07 DIAGNOSIS — D899 Disorder involving the immune mechanism, unspecified: Secondary | ICD-10-CM | POA: Diagnosis not present

## 2015-05-07 DIAGNOSIS — Z4824 Encounter for aftercare following lung transplant: Secondary | ICD-10-CM | POA: Diagnosis not present

## 2015-05-07 DIAGNOSIS — I951 Orthostatic hypotension: Secondary | ICD-10-CM | POA: Diagnosis not present

## 2015-05-07 DIAGNOSIS — E1122 Type 2 diabetes mellitus with diabetic chronic kidney disease: Secondary | ICD-10-CM | POA: Diagnosis not present

## 2015-05-07 DIAGNOSIS — Z992 Dependence on renal dialysis: Secondary | ICD-10-CM | POA: Diagnosis not present

## 2015-05-07 DIAGNOSIS — J8410 Pulmonary fibrosis, unspecified: Secondary | ICD-10-CM | POA: Diagnosis not present

## 2015-05-07 DIAGNOSIS — K219 Gastro-esophageal reflux disease without esophagitis: Secondary | ICD-10-CM | POA: Diagnosis not present

## 2015-05-07 DIAGNOSIS — Z942 Lung transplant status: Secondary | ICD-10-CM | POA: Diagnosis not present

## 2015-05-08 DIAGNOSIS — I951 Orthostatic hypotension: Secondary | ICD-10-CM | POA: Diagnosis not present

## 2015-05-08 DIAGNOSIS — J156 Pneumonia due to other aerobic Gram-negative bacteria: Secondary | ICD-10-CM | POA: Diagnosis not present

## 2015-05-08 DIAGNOSIS — Z992 Dependence on renal dialysis: Secondary | ICD-10-CM | POA: Diagnosis not present

## 2015-05-08 DIAGNOSIS — R262 Difficulty in walking, not elsewhere classified: Secondary | ICD-10-CM | POA: Diagnosis not present

## 2015-05-08 DIAGNOSIS — Z79899 Other long term (current) drug therapy: Secondary | ICD-10-CM | POA: Diagnosis not present

## 2015-05-08 DIAGNOSIS — N186 End stage renal disease: Secondary | ICD-10-CM | POA: Diagnosis not present

## 2015-05-08 DIAGNOSIS — D631 Anemia in chronic kidney disease: Secondary | ICD-10-CM | POA: Diagnosis not present

## 2015-05-09 DIAGNOSIS — R262 Difficulty in walking, not elsewhere classified: Secondary | ICD-10-CM | POA: Diagnosis not present

## 2015-05-11 DIAGNOSIS — I951 Orthostatic hypotension: Secondary | ICD-10-CM | POA: Diagnosis not present

## 2015-05-11 DIAGNOSIS — Z992 Dependence on renal dialysis: Secondary | ICD-10-CM | POA: Diagnosis not present

## 2015-05-11 DIAGNOSIS — Z79899 Other long term (current) drug therapy: Secondary | ICD-10-CM | POA: Diagnosis not present

## 2015-05-11 DIAGNOSIS — D631 Anemia in chronic kidney disease: Secondary | ICD-10-CM | POA: Diagnosis not present

## 2015-05-11 DIAGNOSIS — J156 Pneumonia due to other aerobic Gram-negative bacteria: Secondary | ICD-10-CM | POA: Diagnosis not present

## 2015-05-11 DIAGNOSIS — N186 End stage renal disease: Secondary | ICD-10-CM | POA: Diagnosis not present

## 2015-05-12 DIAGNOSIS — R262 Difficulty in walking, not elsewhere classified: Secondary | ICD-10-CM | POA: Diagnosis not present

## 2015-05-13 DIAGNOSIS — Z79899 Other long term (current) drug therapy: Secondary | ICD-10-CM | POA: Diagnosis not present

## 2015-05-13 DIAGNOSIS — Z992 Dependence on renal dialysis: Secondary | ICD-10-CM | POA: Diagnosis not present

## 2015-05-13 DIAGNOSIS — R262 Difficulty in walking, not elsewhere classified: Secondary | ICD-10-CM | POA: Diagnosis not present

## 2015-05-13 DIAGNOSIS — I951 Orthostatic hypotension: Secondary | ICD-10-CM | POA: Diagnosis not present

## 2015-05-13 DIAGNOSIS — J156 Pneumonia due to other aerobic Gram-negative bacteria: Secondary | ICD-10-CM | POA: Diagnosis not present

## 2015-05-13 DIAGNOSIS — N186 End stage renal disease: Secondary | ICD-10-CM | POA: Diagnosis not present

## 2015-05-13 DIAGNOSIS — D631 Anemia in chronic kidney disease: Secondary | ICD-10-CM | POA: Diagnosis not present

## 2015-05-15 DIAGNOSIS — J156 Pneumonia due to other aerobic Gram-negative bacteria: Secondary | ICD-10-CM | POA: Diagnosis not present

## 2015-05-15 DIAGNOSIS — Z79899 Other long term (current) drug therapy: Secondary | ICD-10-CM | POA: Diagnosis not present

## 2015-05-15 DIAGNOSIS — I951 Orthostatic hypotension: Secondary | ICD-10-CM | POA: Diagnosis not present

## 2015-05-15 DIAGNOSIS — D631 Anemia in chronic kidney disease: Secondary | ICD-10-CM | POA: Diagnosis not present

## 2015-05-15 DIAGNOSIS — Z992 Dependence on renal dialysis: Secondary | ICD-10-CM | POA: Diagnosis not present

## 2015-05-15 DIAGNOSIS — N186 End stage renal disease: Secondary | ICD-10-CM | POA: Diagnosis not present

## 2015-05-15 DIAGNOSIS — R262 Difficulty in walking, not elsewhere classified: Secondary | ICD-10-CM | POA: Diagnosis not present

## 2015-05-16 DIAGNOSIS — R262 Difficulty in walking, not elsewhere classified: Secondary | ICD-10-CM | POA: Diagnosis not present

## 2015-05-16 LAB — FUNGUS CULTURE W SMEAR: FUNGAL SMEAR: NONE SEEN

## 2015-05-18 DIAGNOSIS — R262 Difficulty in walking, not elsewhere classified: Secondary | ICD-10-CM | POA: Diagnosis not present

## 2015-05-18 DIAGNOSIS — N186 End stage renal disease: Secondary | ICD-10-CM | POA: Diagnosis not present

## 2015-05-18 DIAGNOSIS — J156 Pneumonia due to other aerobic Gram-negative bacteria: Secondary | ICD-10-CM | POA: Diagnosis not present

## 2015-05-18 DIAGNOSIS — Z79899 Other long term (current) drug therapy: Secondary | ICD-10-CM | POA: Diagnosis not present

## 2015-05-18 DIAGNOSIS — D631 Anemia in chronic kidney disease: Secondary | ICD-10-CM | POA: Diagnosis not present

## 2015-05-18 DIAGNOSIS — I951 Orthostatic hypotension: Secondary | ICD-10-CM | POA: Diagnosis not present

## 2015-05-18 DIAGNOSIS — Z992 Dependence on renal dialysis: Secondary | ICD-10-CM | POA: Diagnosis not present

## 2015-05-19 DIAGNOSIS — R739 Hyperglycemia, unspecified: Secondary | ICD-10-CM | POA: Diagnosis not present

## 2015-05-19 DIAGNOSIS — T380X5D Adverse effect of glucocorticoids and synthetic analogues, subsequent encounter: Secondary | ICD-10-CM | POA: Diagnosis not present

## 2015-05-19 DIAGNOSIS — R5382 Chronic fatigue, unspecified: Secondary | ICD-10-CM | POA: Diagnosis not present

## 2015-05-19 DIAGNOSIS — D899 Disorder involving the immune mechanism, unspecified: Secondary | ICD-10-CM | POA: Diagnosis not present

## 2015-05-19 DIAGNOSIS — Z4824 Encounter for aftercare following lung transplant: Secondary | ICD-10-CM | POA: Diagnosis not present

## 2015-05-19 DIAGNOSIS — R251 Tremor, unspecified: Secondary | ICD-10-CM | POA: Diagnosis not present

## 2015-05-19 DIAGNOSIS — Z79899 Other long term (current) drug therapy: Secondary | ICD-10-CM | POA: Diagnosis not present

## 2015-05-19 DIAGNOSIS — Z8679 Personal history of other diseases of the circulatory system: Secondary | ICD-10-CM | POA: Diagnosis not present

## 2015-05-19 DIAGNOSIS — I951 Orthostatic hypotension: Secondary | ICD-10-CM | POA: Diagnosis not present

## 2015-05-19 DIAGNOSIS — J90 Pleural effusion, not elsewhere classified: Secondary | ICD-10-CM | POA: Diagnosis not present

## 2015-05-19 DIAGNOSIS — Z7952 Long term (current) use of systemic steroids: Secondary | ICD-10-CM | POA: Diagnosis not present

## 2015-05-19 DIAGNOSIS — J9811 Atelectasis: Secondary | ICD-10-CM | POA: Diagnosis not present

## 2015-05-19 DIAGNOSIS — R918 Other nonspecific abnormal finding of lung field: Secondary | ICD-10-CM | POA: Diagnosis not present

## 2015-05-19 DIAGNOSIS — N185 Chronic kidney disease, stage 5: Secondary | ICD-10-CM | POA: Diagnosis not present

## 2015-05-19 DIAGNOSIS — J988 Other specified respiratory disorders: Secondary | ICD-10-CM | POA: Diagnosis not present

## 2015-05-19 DIAGNOSIS — Z942 Lung transplant status: Secondary | ICD-10-CM | POA: Diagnosis not present

## 2015-05-20 DIAGNOSIS — I951 Orthostatic hypotension: Secondary | ICD-10-CM | POA: Diagnosis not present

## 2015-05-20 DIAGNOSIS — Z992 Dependence on renal dialysis: Secondary | ICD-10-CM | POA: Diagnosis not present

## 2015-05-20 DIAGNOSIS — N186 End stage renal disease: Secondary | ICD-10-CM | POA: Diagnosis not present

## 2015-05-20 DIAGNOSIS — D631 Anemia in chronic kidney disease: Secondary | ICD-10-CM | POA: Diagnosis not present

## 2015-05-20 DIAGNOSIS — R262 Difficulty in walking, not elsewhere classified: Secondary | ICD-10-CM | POA: Diagnosis not present

## 2015-05-20 DIAGNOSIS — Z79899 Other long term (current) drug therapy: Secondary | ICD-10-CM | POA: Diagnosis not present

## 2015-05-20 DIAGNOSIS — J156 Pneumonia due to other aerobic Gram-negative bacteria: Secondary | ICD-10-CM | POA: Diagnosis not present

## 2015-05-21 DIAGNOSIS — N186 End stage renal disease: Secondary | ICD-10-CM | POA: Diagnosis not present

## 2015-05-21 DIAGNOSIS — R262 Difficulty in walking, not elsewhere classified: Secondary | ICD-10-CM | POA: Diagnosis not present

## 2015-05-21 DIAGNOSIS — Z87891 Personal history of nicotine dependence: Secondary | ICD-10-CM | POA: Diagnosis not present

## 2015-05-21 DIAGNOSIS — Z942 Lung transplant status: Secondary | ICD-10-CM | POA: Diagnosis not present

## 2015-05-21 DIAGNOSIS — N189 Chronic kidney disease, unspecified: Secondary | ICD-10-CM | POA: Diagnosis not present

## 2015-05-21 DIAGNOSIS — Z992 Dependence on renal dialysis: Secondary | ICD-10-CM | POA: Diagnosis not present

## 2015-05-21 DIAGNOSIS — E1122 Type 2 diabetes mellitus with diabetic chronic kidney disease: Secondary | ICD-10-CM | POA: Diagnosis not present

## 2015-05-21 DIAGNOSIS — I120 Hypertensive chronic kidney disease with stage 5 chronic kidney disease or end stage renal disease: Secondary | ICD-10-CM | POA: Diagnosis not present

## 2015-05-22 DIAGNOSIS — J156 Pneumonia due to other aerobic Gram-negative bacteria: Secondary | ICD-10-CM | POA: Diagnosis not present

## 2015-05-22 DIAGNOSIS — R262 Difficulty in walking, not elsewhere classified: Secondary | ICD-10-CM | POA: Diagnosis not present

## 2015-05-22 DIAGNOSIS — I951 Orthostatic hypotension: Secondary | ICD-10-CM | POA: Diagnosis not present

## 2015-05-22 DIAGNOSIS — Z992 Dependence on renal dialysis: Secondary | ICD-10-CM | POA: Diagnosis not present

## 2015-05-22 DIAGNOSIS — N186 End stage renal disease: Secondary | ICD-10-CM | POA: Diagnosis not present

## 2015-05-22 DIAGNOSIS — D631 Anemia in chronic kidney disease: Secondary | ICD-10-CM | POA: Diagnosis not present

## 2015-05-22 DIAGNOSIS — Z79899 Other long term (current) drug therapy: Secondary | ICD-10-CM | POA: Diagnosis not present

## 2015-05-23 DIAGNOSIS — R262 Difficulty in walking, not elsewhere classified: Secondary | ICD-10-CM | POA: Diagnosis not present

## 2015-05-25 DIAGNOSIS — Z79899 Other long term (current) drug therapy: Secondary | ICD-10-CM | POA: Diagnosis not present

## 2015-05-25 DIAGNOSIS — D631 Anemia in chronic kidney disease: Secondary | ICD-10-CM | POA: Diagnosis not present

## 2015-05-25 DIAGNOSIS — J156 Pneumonia due to other aerobic Gram-negative bacteria: Secondary | ICD-10-CM | POA: Diagnosis not present

## 2015-05-25 DIAGNOSIS — Z992 Dependence on renal dialysis: Secondary | ICD-10-CM | POA: Diagnosis not present

## 2015-05-25 DIAGNOSIS — I951 Orthostatic hypotension: Secondary | ICD-10-CM | POA: Diagnosis not present

## 2015-05-25 DIAGNOSIS — N186 End stage renal disease: Secondary | ICD-10-CM | POA: Diagnosis not present

## 2015-05-25 DIAGNOSIS — R262 Difficulty in walking, not elsewhere classified: Secondary | ICD-10-CM | POA: Diagnosis not present

## 2015-05-27 DIAGNOSIS — N186 End stage renal disease: Secondary | ICD-10-CM | POA: Diagnosis not present

## 2015-05-27 DIAGNOSIS — I951 Orthostatic hypotension: Secondary | ICD-10-CM | POA: Diagnosis not present

## 2015-05-27 DIAGNOSIS — D631 Anemia in chronic kidney disease: Secondary | ICD-10-CM | POA: Diagnosis not present

## 2015-05-27 DIAGNOSIS — Z992 Dependence on renal dialysis: Secondary | ICD-10-CM | POA: Diagnosis not present

## 2015-05-27 DIAGNOSIS — J156 Pneumonia due to other aerobic Gram-negative bacteria: Secondary | ICD-10-CM | POA: Diagnosis not present

## 2015-05-27 DIAGNOSIS — Z79899 Other long term (current) drug therapy: Secondary | ICD-10-CM | POA: Diagnosis not present

## 2015-05-27 DIAGNOSIS — R262 Difficulty in walking, not elsewhere classified: Secondary | ICD-10-CM | POA: Diagnosis not present

## 2015-05-28 DIAGNOSIS — R262 Difficulty in walking, not elsewhere classified: Secondary | ICD-10-CM | POA: Diagnosis not present

## 2015-05-29 DIAGNOSIS — Z79899 Other long term (current) drug therapy: Secondary | ICD-10-CM | POA: Diagnosis not present

## 2015-05-29 DIAGNOSIS — N186 End stage renal disease: Secondary | ICD-10-CM | POA: Diagnosis not present

## 2015-05-29 DIAGNOSIS — I951 Orthostatic hypotension: Secondary | ICD-10-CM | POA: Diagnosis not present

## 2015-05-29 DIAGNOSIS — Z992 Dependence on renal dialysis: Secondary | ICD-10-CM | POA: Diagnosis not present

## 2015-05-29 DIAGNOSIS — D631 Anemia in chronic kidney disease: Secondary | ICD-10-CM | POA: Diagnosis not present

## 2015-05-29 DIAGNOSIS — J156 Pneumonia due to other aerobic Gram-negative bacteria: Secondary | ICD-10-CM | POA: Diagnosis not present

## 2015-06-01 DIAGNOSIS — Z79899 Other long term (current) drug therapy: Secondary | ICD-10-CM | POA: Diagnosis not present

## 2015-06-01 DIAGNOSIS — I951 Orthostatic hypotension: Secondary | ICD-10-CM | POA: Diagnosis not present

## 2015-06-01 DIAGNOSIS — Z992 Dependence on renal dialysis: Secondary | ICD-10-CM | POA: Diagnosis not present

## 2015-06-01 DIAGNOSIS — N186 End stage renal disease: Secondary | ICD-10-CM | POA: Diagnosis not present

## 2015-06-01 DIAGNOSIS — J156 Pneumonia due to other aerobic Gram-negative bacteria: Secondary | ICD-10-CM | POA: Diagnosis not present

## 2015-06-01 DIAGNOSIS — D631 Anemia in chronic kidney disease: Secondary | ICD-10-CM | POA: Diagnosis not present

## 2015-06-01 LAB — AFB CULTURE WITH SMEAR (NOT AT ARMC): ACID FAST SMEAR: NONE SEEN

## 2015-06-02 DIAGNOSIS — R262 Difficulty in walking, not elsewhere classified: Secondary | ICD-10-CM | POA: Diagnosis not present

## 2015-06-03 DIAGNOSIS — J156 Pneumonia due to other aerobic Gram-negative bacteria: Secondary | ICD-10-CM | POA: Diagnosis not present

## 2015-06-03 DIAGNOSIS — D631 Anemia in chronic kidney disease: Secondary | ICD-10-CM | POA: Diagnosis not present

## 2015-06-03 DIAGNOSIS — I951 Orthostatic hypotension: Secondary | ICD-10-CM | POA: Diagnosis not present

## 2015-06-03 DIAGNOSIS — Z992 Dependence on renal dialysis: Secondary | ICD-10-CM | POA: Diagnosis not present

## 2015-06-03 DIAGNOSIS — N186 End stage renal disease: Secondary | ICD-10-CM | POA: Diagnosis not present

## 2015-06-03 DIAGNOSIS — R262 Difficulty in walking, not elsewhere classified: Secondary | ICD-10-CM | POA: Diagnosis not present

## 2015-06-03 DIAGNOSIS — Z79899 Other long term (current) drug therapy: Secondary | ICD-10-CM | POA: Diagnosis not present

## 2015-06-04 DIAGNOSIS — D899 Disorder involving the immune mechanism, unspecified: Secondary | ICD-10-CM | POA: Diagnosis not present

## 2015-06-04 DIAGNOSIS — Z942 Lung transplant status: Secondary | ICD-10-CM | POA: Diagnosis not present

## 2015-06-04 DIAGNOSIS — R5382 Chronic fatigue, unspecified: Secondary | ICD-10-CM | POA: Diagnosis not present

## 2015-06-04 DIAGNOSIS — Z4824 Encounter for aftercare following lung transplant: Secondary | ICD-10-CM | POA: Diagnosis not present

## 2015-06-04 DIAGNOSIS — N186 End stage renal disease: Secondary | ICD-10-CM | POA: Diagnosis not present

## 2015-06-04 DIAGNOSIS — N185 Chronic kidney disease, stage 5: Secondary | ICD-10-CM | POA: Diagnosis not present

## 2015-06-04 DIAGNOSIS — R918 Other nonspecific abnormal finding of lung field: Secondary | ICD-10-CM | POA: Diagnosis not present

## 2015-06-04 DIAGNOSIS — Z7952 Long term (current) use of systemic steroids: Secondary | ICD-10-CM | POA: Diagnosis not present

## 2015-06-04 DIAGNOSIS — Z79899 Other long term (current) drug therapy: Secondary | ICD-10-CM | POA: Diagnosis not present

## 2015-06-04 DIAGNOSIS — I951 Orthostatic hypotension: Secondary | ICD-10-CM | POA: Diagnosis not present

## 2015-06-05 DIAGNOSIS — N186 End stage renal disease: Secondary | ICD-10-CM | POA: Diagnosis not present

## 2015-06-05 DIAGNOSIS — J156 Pneumonia due to other aerobic Gram-negative bacteria: Secondary | ICD-10-CM | POA: Diagnosis not present

## 2015-06-05 DIAGNOSIS — Z992 Dependence on renal dialysis: Secondary | ICD-10-CM | POA: Diagnosis not present

## 2015-06-05 DIAGNOSIS — D631 Anemia in chronic kidney disease: Secondary | ICD-10-CM | POA: Diagnosis not present

## 2015-06-05 DIAGNOSIS — I951 Orthostatic hypotension: Secondary | ICD-10-CM | POA: Diagnosis not present

## 2015-06-05 DIAGNOSIS — R262 Difficulty in walking, not elsewhere classified: Secondary | ICD-10-CM | POA: Diagnosis not present

## 2015-06-05 DIAGNOSIS — Z79899 Other long term (current) drug therapy: Secondary | ICD-10-CM | POA: Diagnosis not present

## 2015-06-07 DIAGNOSIS — N186 End stage renal disease: Secondary | ICD-10-CM | POA: Diagnosis not present

## 2015-06-08 DIAGNOSIS — D631 Anemia in chronic kidney disease: Secondary | ICD-10-CM | POA: Diagnosis not present

## 2015-06-08 DIAGNOSIS — J156 Pneumonia due to other aerobic Gram-negative bacteria: Secondary | ICD-10-CM | POA: Diagnosis not present

## 2015-06-08 DIAGNOSIS — E1122 Type 2 diabetes mellitus with diabetic chronic kidney disease: Secondary | ICD-10-CM | POA: Diagnosis not present

## 2015-06-08 DIAGNOSIS — Z79899 Other long term (current) drug therapy: Secondary | ICD-10-CM | POA: Diagnosis not present

## 2015-06-08 DIAGNOSIS — Z992 Dependence on renal dialysis: Secondary | ICD-10-CM | POA: Diagnosis not present

## 2015-06-08 DIAGNOSIS — Z1159 Encounter for screening for other viral diseases: Secondary | ICD-10-CM | POA: Diagnosis not present

## 2015-06-08 DIAGNOSIS — I120 Hypertensive chronic kidney disease with stage 5 chronic kidney disease or end stage renal disease: Secondary | ICD-10-CM | POA: Diagnosis not present

## 2015-06-08 DIAGNOSIS — N186 End stage renal disease: Secondary | ICD-10-CM | POA: Diagnosis not present

## 2015-06-08 DIAGNOSIS — I951 Orthostatic hypotension: Secondary | ICD-10-CM | POA: Diagnosis not present

## 2015-06-08 DIAGNOSIS — Z5181 Encounter for therapeutic drug level monitoring: Secondary | ICD-10-CM | POA: Diagnosis not present

## 2015-06-08 DIAGNOSIS — R600 Localized edema: Secondary | ICD-10-CM | POA: Diagnosis not present

## 2015-06-09 DIAGNOSIS — R1030 Lower abdominal pain, unspecified: Secondary | ICD-10-CM | POA: Diagnosis not present

## 2015-06-09 DIAGNOSIS — R262 Difficulty in walking, not elsewhere classified: Secondary | ICD-10-CM | POA: Diagnosis not present

## 2015-06-09 DIAGNOSIS — M79662 Pain in left lower leg: Secondary | ICD-10-CM | POA: Diagnosis not present

## 2015-06-10 DIAGNOSIS — I120 Hypertensive chronic kidney disease with stage 5 chronic kidney disease or end stage renal disease: Secondary | ICD-10-CM | POA: Diagnosis not present

## 2015-06-10 DIAGNOSIS — Z992 Dependence on renal dialysis: Secondary | ICD-10-CM | POA: Diagnosis not present

## 2015-06-10 DIAGNOSIS — D631 Anemia in chronic kidney disease: Secondary | ICD-10-CM | POA: Diagnosis not present

## 2015-06-10 DIAGNOSIS — J156 Pneumonia due to other aerobic Gram-negative bacteria: Secondary | ICD-10-CM | POA: Diagnosis not present

## 2015-06-10 DIAGNOSIS — R262 Difficulty in walking, not elsewhere classified: Secondary | ICD-10-CM | POA: Diagnosis not present

## 2015-06-10 DIAGNOSIS — N186 End stage renal disease: Secondary | ICD-10-CM | POA: Diagnosis not present

## 2015-06-10 DIAGNOSIS — R1030 Lower abdominal pain, unspecified: Secondary | ICD-10-CM | POA: Diagnosis not present

## 2015-06-10 DIAGNOSIS — E1122 Type 2 diabetes mellitus with diabetic chronic kidney disease: Secondary | ICD-10-CM | POA: Diagnosis not present

## 2015-06-10 DIAGNOSIS — M79662 Pain in left lower leg: Secondary | ICD-10-CM | POA: Diagnosis not present

## 2015-06-11 DIAGNOSIS — R262 Difficulty in walking, not elsewhere classified: Secondary | ICD-10-CM | POA: Diagnosis not present

## 2015-06-11 DIAGNOSIS — M79662 Pain in left lower leg: Secondary | ICD-10-CM | POA: Diagnosis not present

## 2015-06-11 DIAGNOSIS — R1030 Lower abdominal pain, unspecified: Secondary | ICD-10-CM | POA: Diagnosis not present

## 2015-06-12 DIAGNOSIS — M79662 Pain in left lower leg: Secondary | ICD-10-CM | POA: Diagnosis not present

## 2015-06-12 DIAGNOSIS — E1122 Type 2 diabetes mellitus with diabetic chronic kidney disease: Secondary | ICD-10-CM | POA: Diagnosis not present

## 2015-06-12 DIAGNOSIS — Z992 Dependence on renal dialysis: Secondary | ICD-10-CM | POA: Diagnosis not present

## 2015-06-12 DIAGNOSIS — R262 Difficulty in walking, not elsewhere classified: Secondary | ICD-10-CM | POA: Diagnosis not present

## 2015-06-12 DIAGNOSIS — I120 Hypertensive chronic kidney disease with stage 5 chronic kidney disease or end stage renal disease: Secondary | ICD-10-CM | POA: Diagnosis not present

## 2015-06-12 DIAGNOSIS — R1030 Lower abdominal pain, unspecified: Secondary | ICD-10-CM | POA: Diagnosis not present

## 2015-06-12 DIAGNOSIS — J156 Pneumonia due to other aerobic Gram-negative bacteria: Secondary | ICD-10-CM | POA: Diagnosis not present

## 2015-06-12 DIAGNOSIS — D631 Anemia in chronic kidney disease: Secondary | ICD-10-CM | POA: Diagnosis not present

## 2015-06-12 DIAGNOSIS — N186 End stage renal disease: Secondary | ICD-10-CM | POA: Diagnosis not present

## 2015-06-15 DIAGNOSIS — N186 End stage renal disease: Secondary | ICD-10-CM | POA: Diagnosis not present

## 2015-06-15 DIAGNOSIS — E1122 Type 2 diabetes mellitus with diabetic chronic kidney disease: Secondary | ICD-10-CM | POA: Diagnosis not present

## 2015-06-15 DIAGNOSIS — J156 Pneumonia due to other aerobic Gram-negative bacteria: Secondary | ICD-10-CM | POA: Diagnosis not present

## 2015-06-15 DIAGNOSIS — D631 Anemia in chronic kidney disease: Secondary | ICD-10-CM | POA: Diagnosis not present

## 2015-06-15 DIAGNOSIS — Z992 Dependence on renal dialysis: Secondary | ICD-10-CM | POA: Diagnosis not present

## 2015-06-15 DIAGNOSIS — I120 Hypertensive chronic kidney disease with stage 5 chronic kidney disease or end stage renal disease: Secondary | ICD-10-CM | POA: Diagnosis not present

## 2015-06-16 DIAGNOSIS — Z7952 Long term (current) use of systemic steroids: Secondary | ICD-10-CM | POA: Diagnosis not present

## 2015-06-16 DIAGNOSIS — E876 Hypokalemia: Secondary | ICD-10-CM | POA: Diagnosis not present

## 2015-06-16 DIAGNOSIS — I150 Renovascular hypertension: Secondary | ICD-10-CM | POA: Diagnosis not present

## 2015-06-16 DIAGNOSIS — G4733 Obstructive sleep apnea (adult) (pediatric): Secondary | ICD-10-CM | POA: Diagnosis not present

## 2015-06-16 DIAGNOSIS — Z01818 Encounter for other preprocedural examination: Secondary | ICD-10-CM | POA: Diagnosis not present

## 2015-06-16 DIAGNOSIS — Z942 Lung transplant status: Secondary | ICD-10-CM | POA: Diagnosis not present

## 2015-06-16 DIAGNOSIS — E0822 Diabetes mellitus due to underlying condition with diabetic chronic kidney disease: Secondary | ICD-10-CM | POA: Diagnosis not present

## 2015-06-16 DIAGNOSIS — K269 Duodenal ulcer, unspecified as acute or chronic, without hemorrhage or perforation: Secondary | ICD-10-CM | POA: Diagnosis not present

## 2015-06-16 DIAGNOSIS — D631 Anemia in chronic kidney disease: Secondary | ICD-10-CM | POA: Diagnosis not present

## 2015-06-16 DIAGNOSIS — R5381 Other malaise: Secondary | ICD-10-CM | POA: Diagnosis not present

## 2015-06-16 DIAGNOSIS — F339 Major depressive disorder, recurrent, unspecified: Secondary | ICD-10-CM | POA: Diagnosis not present

## 2015-06-16 DIAGNOSIS — Z794 Long term (current) use of insulin: Secondary | ICD-10-CM | POA: Diagnosis not present

## 2015-06-16 DIAGNOSIS — I951 Orthostatic hypotension: Secondary | ICD-10-CM | POA: Diagnosis not present

## 2015-06-16 DIAGNOSIS — N186 End stage renal disease: Secondary | ICD-10-CM | POA: Diagnosis not present

## 2015-06-16 DIAGNOSIS — Z79899 Other long term (current) drug therapy: Secondary | ICD-10-CM | POA: Diagnosis not present

## 2015-06-16 DIAGNOSIS — K219 Gastro-esophageal reflux disease without esophagitis: Secondary | ICD-10-CM | POA: Diagnosis not present

## 2015-06-16 DIAGNOSIS — E785 Hyperlipidemia, unspecified: Secondary | ICD-10-CM | POA: Diagnosis not present

## 2015-06-17 DIAGNOSIS — N186 End stage renal disease: Secondary | ICD-10-CM | POA: Diagnosis not present

## 2015-06-17 DIAGNOSIS — R262 Difficulty in walking, not elsewhere classified: Secondary | ICD-10-CM | POA: Diagnosis not present

## 2015-06-17 DIAGNOSIS — M79662 Pain in left lower leg: Secondary | ICD-10-CM | POA: Diagnosis not present

## 2015-06-17 DIAGNOSIS — Z992 Dependence on renal dialysis: Secondary | ICD-10-CM | POA: Diagnosis not present

## 2015-06-17 DIAGNOSIS — D631 Anemia in chronic kidney disease: Secondary | ICD-10-CM | POA: Diagnosis not present

## 2015-06-17 DIAGNOSIS — J156 Pneumonia due to other aerobic Gram-negative bacteria: Secondary | ICD-10-CM | POA: Diagnosis not present

## 2015-06-17 DIAGNOSIS — I120 Hypertensive chronic kidney disease with stage 5 chronic kidney disease or end stage renal disease: Secondary | ICD-10-CM | POA: Diagnosis not present

## 2015-06-17 DIAGNOSIS — R1030 Lower abdominal pain, unspecified: Secondary | ICD-10-CM | POA: Diagnosis not present

## 2015-06-17 DIAGNOSIS — E1122 Type 2 diabetes mellitus with diabetic chronic kidney disease: Secondary | ICD-10-CM | POA: Diagnosis not present

## 2015-06-18 DIAGNOSIS — M79662 Pain in left lower leg: Secondary | ICD-10-CM | POA: Diagnosis not present

## 2015-06-18 DIAGNOSIS — R262 Difficulty in walking, not elsewhere classified: Secondary | ICD-10-CM | POA: Diagnosis not present

## 2015-06-18 DIAGNOSIS — R1030 Lower abdominal pain, unspecified: Secondary | ICD-10-CM | POA: Diagnosis not present

## 2015-06-19 DIAGNOSIS — N186 End stage renal disease: Secondary | ICD-10-CM | POA: Diagnosis not present

## 2015-06-19 DIAGNOSIS — R262 Difficulty in walking, not elsewhere classified: Secondary | ICD-10-CM | POA: Diagnosis not present

## 2015-06-19 DIAGNOSIS — R1030 Lower abdominal pain, unspecified: Secondary | ICD-10-CM | POA: Diagnosis not present

## 2015-06-19 DIAGNOSIS — M79662 Pain in left lower leg: Secondary | ICD-10-CM | POA: Diagnosis not present

## 2015-06-19 DIAGNOSIS — Z992 Dependence on renal dialysis: Secondary | ICD-10-CM | POA: Diagnosis not present

## 2015-06-19 DIAGNOSIS — D631 Anemia in chronic kidney disease: Secondary | ICD-10-CM | POA: Diagnosis not present

## 2015-06-19 DIAGNOSIS — E1122 Type 2 diabetes mellitus with diabetic chronic kidney disease: Secondary | ICD-10-CM | POA: Diagnosis not present

## 2015-06-19 DIAGNOSIS — I120 Hypertensive chronic kidney disease with stage 5 chronic kidney disease or end stage renal disease: Secondary | ICD-10-CM | POA: Diagnosis not present

## 2015-06-19 DIAGNOSIS — J156 Pneumonia due to other aerobic Gram-negative bacteria: Secondary | ICD-10-CM | POA: Diagnosis not present

## 2015-06-22 DIAGNOSIS — J156 Pneumonia due to other aerobic Gram-negative bacteria: Secondary | ICD-10-CM | POA: Diagnosis not present

## 2015-06-22 DIAGNOSIS — I120 Hypertensive chronic kidney disease with stage 5 chronic kidney disease or end stage renal disease: Secondary | ICD-10-CM | POA: Diagnosis not present

## 2015-06-22 DIAGNOSIS — Z992 Dependence on renal dialysis: Secondary | ICD-10-CM | POA: Diagnosis not present

## 2015-06-22 DIAGNOSIS — N186 End stage renal disease: Secondary | ICD-10-CM | POA: Diagnosis not present

## 2015-06-22 DIAGNOSIS — D631 Anemia in chronic kidney disease: Secondary | ICD-10-CM | POA: Diagnosis not present

## 2015-06-22 DIAGNOSIS — E1122 Type 2 diabetes mellitus with diabetic chronic kidney disease: Secondary | ICD-10-CM | POA: Diagnosis not present

## 2015-06-23 DIAGNOSIS — R918 Other nonspecific abnormal finding of lung field: Secondary | ICD-10-CM | POA: Diagnosis not present

## 2015-06-23 DIAGNOSIS — Z4824 Encounter for aftercare following lung transplant: Secondary | ICD-10-CM | POA: Diagnosis not present

## 2015-06-23 DIAGNOSIS — Z942 Lung transplant status: Secondary | ICD-10-CM | POA: Diagnosis not present

## 2015-06-23 DIAGNOSIS — E0822 Diabetes mellitus due to underlying condition with diabetic chronic kidney disease: Secondary | ICD-10-CM | POA: Diagnosis not present

## 2015-06-23 DIAGNOSIS — I951 Orthostatic hypotension: Secondary | ICD-10-CM | POA: Diagnosis not present

## 2015-06-23 DIAGNOSIS — F4321 Adjustment disorder with depressed mood: Secondary | ICD-10-CM | POA: Diagnosis not present

## 2015-06-23 DIAGNOSIS — R5383 Other fatigue: Secondary | ICD-10-CM | POA: Diagnosis not present

## 2015-06-23 DIAGNOSIS — Z794 Long term (current) use of insulin: Secondary | ICD-10-CM | POA: Diagnosis not present

## 2015-06-23 DIAGNOSIS — D899 Disorder involving the immune mechanism, unspecified: Secondary | ICD-10-CM | POA: Diagnosis not present

## 2015-06-23 DIAGNOSIS — N185 Chronic kidney disease, stage 5: Secondary | ICD-10-CM | POA: Diagnosis not present

## 2015-06-24 DIAGNOSIS — Z992 Dependence on renal dialysis: Secondary | ICD-10-CM | POA: Diagnosis not present

## 2015-06-24 DIAGNOSIS — J156 Pneumonia due to other aerobic Gram-negative bacteria: Secondary | ICD-10-CM | POA: Diagnosis not present

## 2015-06-24 DIAGNOSIS — D631 Anemia in chronic kidney disease: Secondary | ICD-10-CM | POA: Diagnosis not present

## 2015-06-24 DIAGNOSIS — M79662 Pain in left lower leg: Secondary | ICD-10-CM | POA: Diagnosis not present

## 2015-06-24 DIAGNOSIS — I120 Hypertensive chronic kidney disease with stage 5 chronic kidney disease or end stage renal disease: Secondary | ICD-10-CM | POA: Diagnosis not present

## 2015-06-24 DIAGNOSIS — R262 Difficulty in walking, not elsewhere classified: Secondary | ICD-10-CM | POA: Diagnosis not present

## 2015-06-24 DIAGNOSIS — R1030 Lower abdominal pain, unspecified: Secondary | ICD-10-CM | POA: Diagnosis not present

## 2015-06-24 DIAGNOSIS — E1122 Type 2 diabetes mellitus with diabetic chronic kidney disease: Secondary | ICD-10-CM | POA: Diagnosis not present

## 2015-06-24 DIAGNOSIS — N186 End stage renal disease: Secondary | ICD-10-CM | POA: Diagnosis not present

## 2015-06-25 DIAGNOSIS — Z942 Lung transplant status: Secondary | ICD-10-CM | POA: Diagnosis not present

## 2015-06-25 DIAGNOSIS — Z7951 Long term (current) use of inhaled steroids: Secondary | ICD-10-CM | POA: Diagnosis not present

## 2015-06-25 DIAGNOSIS — Z794 Long term (current) use of insulin: Secondary | ICD-10-CM | POA: Diagnosis not present

## 2015-06-25 DIAGNOSIS — E1122 Type 2 diabetes mellitus with diabetic chronic kidney disease: Secondary | ICD-10-CM | POA: Diagnosis not present

## 2015-06-25 DIAGNOSIS — Z7982 Long term (current) use of aspirin: Secondary | ICD-10-CM | POA: Diagnosis not present

## 2015-06-25 DIAGNOSIS — N186 End stage renal disease: Secondary | ICD-10-CM | POA: Diagnosis not present

## 2015-06-25 DIAGNOSIS — G4733 Obstructive sleep apnea (adult) (pediatric): Secondary | ICD-10-CM | POA: Diagnosis not present

## 2015-06-25 DIAGNOSIS — I120 Hypertensive chronic kidney disease with stage 5 chronic kidney disease or end stage renal disease: Secondary | ICD-10-CM | POA: Diagnosis not present

## 2015-06-25 DIAGNOSIS — Z7952 Long term (current) use of systemic steroids: Secondary | ICD-10-CM | POA: Diagnosis not present

## 2015-06-25 DIAGNOSIS — Z79899 Other long term (current) drug therapy: Secondary | ICD-10-CM | POA: Diagnosis not present

## 2015-06-25 DIAGNOSIS — Z87891 Personal history of nicotine dependence: Secondary | ICD-10-CM | POA: Diagnosis not present

## 2015-06-25 DIAGNOSIS — Z992 Dependence on renal dialysis: Secondary | ICD-10-CM | POA: Diagnosis not present

## 2015-06-26 DIAGNOSIS — I120 Hypertensive chronic kidney disease with stage 5 chronic kidney disease or end stage renal disease: Secondary | ICD-10-CM | POA: Diagnosis not present

## 2015-06-26 DIAGNOSIS — E1122 Type 2 diabetes mellitus with diabetic chronic kidney disease: Secondary | ICD-10-CM | POA: Diagnosis not present

## 2015-06-26 DIAGNOSIS — D631 Anemia in chronic kidney disease: Secondary | ICD-10-CM | POA: Diagnosis not present

## 2015-06-26 DIAGNOSIS — Z992 Dependence on renal dialysis: Secondary | ICD-10-CM | POA: Diagnosis not present

## 2015-06-26 DIAGNOSIS — N186 End stage renal disease: Secondary | ICD-10-CM | POA: Diagnosis not present

## 2015-06-26 DIAGNOSIS — R262 Difficulty in walking, not elsewhere classified: Secondary | ICD-10-CM | POA: Diagnosis not present

## 2015-06-26 DIAGNOSIS — J156 Pneumonia due to other aerobic Gram-negative bacteria: Secondary | ICD-10-CM | POA: Diagnosis not present

## 2015-06-29 DIAGNOSIS — I120 Hypertensive chronic kidney disease with stage 5 chronic kidney disease or end stage renal disease: Secondary | ICD-10-CM | POA: Diagnosis not present

## 2015-06-29 DIAGNOSIS — Z992 Dependence on renal dialysis: Secondary | ICD-10-CM | POA: Diagnosis not present

## 2015-06-29 DIAGNOSIS — E1122 Type 2 diabetes mellitus with diabetic chronic kidney disease: Secondary | ICD-10-CM | POA: Diagnosis not present

## 2015-06-29 DIAGNOSIS — J156 Pneumonia due to other aerobic Gram-negative bacteria: Secondary | ICD-10-CM | POA: Diagnosis not present

## 2015-06-29 DIAGNOSIS — N186 End stage renal disease: Secondary | ICD-10-CM | POA: Diagnosis not present

## 2015-06-29 DIAGNOSIS — D631 Anemia in chronic kidney disease: Secondary | ICD-10-CM | POA: Diagnosis not present

## 2015-06-30 DIAGNOSIS — Z8679 Personal history of other diseases of the circulatory system: Secondary | ICD-10-CM | POA: Diagnosis not present

## 2015-06-30 DIAGNOSIS — N9989 Other postprocedural complications and disorders of genitourinary system: Secondary | ICD-10-CM | POA: Diagnosis not present

## 2015-06-30 DIAGNOSIS — R1030 Lower abdominal pain, unspecified: Secondary | ICD-10-CM | POA: Diagnosis not present

## 2015-06-30 DIAGNOSIS — R262 Difficulty in walking, not elsewhere classified: Secondary | ICD-10-CM | POA: Diagnosis not present

## 2015-06-30 DIAGNOSIS — I951 Orthostatic hypotension: Secondary | ICD-10-CM | POA: Diagnosis not present

## 2015-06-30 DIAGNOSIS — N186 End stage renal disease: Secondary | ICD-10-CM | POA: Diagnosis not present

## 2015-06-30 DIAGNOSIS — Z942 Lung transplant status: Secondary | ICD-10-CM | POA: Diagnosis not present

## 2015-06-30 DIAGNOSIS — Z87898 Personal history of other specified conditions: Secondary | ICD-10-CM | POA: Diagnosis not present

## 2015-06-30 DIAGNOSIS — M79662 Pain in left lower leg: Secondary | ICD-10-CM | POA: Diagnosis not present

## 2015-06-30 DIAGNOSIS — J849 Interstitial pulmonary disease, unspecified: Secondary | ICD-10-CM | POA: Diagnosis not present

## 2015-06-30 DIAGNOSIS — I517 Cardiomegaly: Secondary | ICD-10-CM | POA: Diagnosis not present

## 2015-07-01 DIAGNOSIS — Z452 Encounter for adjustment and management of vascular access device: Secondary | ICD-10-CM | POA: Diagnosis not present

## 2015-07-01 DIAGNOSIS — I120 Hypertensive chronic kidney disease with stage 5 chronic kidney disease or end stage renal disease: Secondary | ICD-10-CM | POA: Diagnosis not present

## 2015-07-01 DIAGNOSIS — D631 Anemia in chronic kidney disease: Secondary | ICD-10-CM | POA: Diagnosis not present

## 2015-07-01 DIAGNOSIS — J9811 Atelectasis: Secondary | ICD-10-CM | POA: Diagnosis not present

## 2015-07-01 DIAGNOSIS — N186 End stage renal disease: Secondary | ICD-10-CM | POA: Diagnosis not present

## 2015-07-01 DIAGNOSIS — J156 Pneumonia due to other aerobic Gram-negative bacteria: Secondary | ICD-10-CM | POA: Diagnosis not present

## 2015-07-01 DIAGNOSIS — Z4901 Encounter for fitting and adjustment of extracorporeal dialysis catheter: Secondary | ICD-10-CM | POA: Diagnosis not present

## 2015-07-01 DIAGNOSIS — Z794 Long term (current) use of insulin: Secondary | ICD-10-CM | POA: Diagnosis not present

## 2015-07-01 DIAGNOSIS — T8242XA Displacement of vascular dialysis catheter, initial encounter: Secondary | ICD-10-CM | POA: Diagnosis not present

## 2015-07-01 DIAGNOSIS — Z992 Dependence on renal dialysis: Secondary | ICD-10-CM | POA: Diagnosis not present

## 2015-07-01 DIAGNOSIS — Z87891 Personal history of nicotine dependence: Secondary | ICD-10-CM | POA: Diagnosis not present

## 2015-07-01 DIAGNOSIS — E1122 Type 2 diabetes mellitus with diabetic chronic kidney disease: Secondary | ICD-10-CM | POA: Diagnosis not present

## 2015-07-01 DIAGNOSIS — Z942 Lung transplant status: Secondary | ICD-10-CM | POA: Diagnosis not present

## 2015-07-01 DIAGNOSIS — I82C11 Acute embolism and thrombosis of right internal jugular vein: Secondary | ICD-10-CM | POA: Diagnosis not present

## 2015-07-02 DIAGNOSIS — M79662 Pain in left lower leg: Secondary | ICD-10-CM | POA: Diagnosis not present

## 2015-07-02 DIAGNOSIS — R1030 Lower abdominal pain, unspecified: Secondary | ICD-10-CM | POA: Diagnosis not present

## 2015-07-02 DIAGNOSIS — R262 Difficulty in walking, not elsewhere classified: Secondary | ICD-10-CM | POA: Diagnosis not present

## 2015-07-03 DIAGNOSIS — D631 Anemia in chronic kidney disease: Secondary | ICD-10-CM | POA: Diagnosis not present

## 2015-07-03 DIAGNOSIS — J156 Pneumonia due to other aerobic Gram-negative bacteria: Secondary | ICD-10-CM | POA: Diagnosis not present

## 2015-07-03 DIAGNOSIS — N186 End stage renal disease: Secondary | ICD-10-CM | POA: Diagnosis not present

## 2015-07-03 DIAGNOSIS — M79662 Pain in left lower leg: Secondary | ICD-10-CM | POA: Diagnosis not present

## 2015-07-03 DIAGNOSIS — R1030 Lower abdominal pain, unspecified: Secondary | ICD-10-CM | POA: Diagnosis not present

## 2015-07-03 DIAGNOSIS — Z992 Dependence on renal dialysis: Secondary | ICD-10-CM | POA: Diagnosis not present

## 2015-07-03 DIAGNOSIS — R262 Difficulty in walking, not elsewhere classified: Secondary | ICD-10-CM | POA: Diagnosis not present

## 2015-07-03 DIAGNOSIS — I120 Hypertensive chronic kidney disease with stage 5 chronic kidney disease or end stage renal disease: Secondary | ICD-10-CM | POA: Diagnosis not present

## 2015-07-03 DIAGNOSIS — E1122 Type 2 diabetes mellitus with diabetic chronic kidney disease: Secondary | ICD-10-CM | POA: Diagnosis not present

## 2015-07-06 DIAGNOSIS — Z992 Dependence on renal dialysis: Secondary | ICD-10-CM | POA: Diagnosis not present

## 2015-07-06 DIAGNOSIS — D631 Anemia in chronic kidney disease: Secondary | ICD-10-CM | POA: Diagnosis not present

## 2015-07-06 DIAGNOSIS — R1030 Lower abdominal pain, unspecified: Secondary | ICD-10-CM | POA: Diagnosis not present

## 2015-07-06 DIAGNOSIS — J156 Pneumonia due to other aerobic Gram-negative bacteria: Secondary | ICD-10-CM | POA: Diagnosis not present

## 2015-07-06 DIAGNOSIS — R262 Difficulty in walking, not elsewhere classified: Secondary | ICD-10-CM | POA: Diagnosis not present

## 2015-07-06 DIAGNOSIS — E1122 Type 2 diabetes mellitus with diabetic chronic kidney disease: Secondary | ICD-10-CM | POA: Diagnosis not present

## 2015-07-06 DIAGNOSIS — N186 End stage renal disease: Secondary | ICD-10-CM | POA: Diagnosis not present

## 2015-07-06 DIAGNOSIS — I120 Hypertensive chronic kidney disease with stage 5 chronic kidney disease or end stage renal disease: Secondary | ICD-10-CM | POA: Diagnosis not present

## 2015-07-06 DIAGNOSIS — M79662 Pain in left lower leg: Secondary | ICD-10-CM | POA: Diagnosis not present

## 2015-07-07 DIAGNOSIS — Z942 Lung transplant status: Secondary | ICD-10-CM | POA: Diagnosis not present

## 2015-07-07 DIAGNOSIS — R6881 Early satiety: Secondary | ICD-10-CM | POA: Diagnosis not present

## 2015-07-08 DIAGNOSIS — Z992 Dependence on renal dialysis: Secondary | ICD-10-CM | POA: Diagnosis not present

## 2015-07-08 DIAGNOSIS — D899 Disorder involving the immune mechanism, unspecified: Secondary | ICD-10-CM | POA: Diagnosis not present

## 2015-07-08 DIAGNOSIS — R6881 Early satiety: Secondary | ICD-10-CM | POA: Diagnosis not present

## 2015-07-08 DIAGNOSIS — E1122 Type 2 diabetes mellitus with diabetic chronic kidney disease: Secondary | ICD-10-CM | POA: Diagnosis not present

## 2015-07-08 DIAGNOSIS — T380X5A Adverse effect of glucocorticoids and synthetic analogues, initial encounter: Secondary | ICD-10-CM | POA: Diagnosis not present

## 2015-07-08 DIAGNOSIS — R600 Localized edema: Secondary | ICD-10-CM | POA: Diagnosis not present

## 2015-07-08 DIAGNOSIS — I120 Hypertensive chronic kidney disease with stage 5 chronic kidney disease or end stage renal disease: Secondary | ICD-10-CM | POA: Diagnosis not present

## 2015-07-08 DIAGNOSIS — Z5181 Encounter for therapeutic drug level monitoring: Secondary | ICD-10-CM | POA: Diagnosis not present

## 2015-07-08 DIAGNOSIS — N185 Chronic kidney disease, stage 5: Secondary | ICD-10-CM | POA: Diagnosis not present

## 2015-07-08 DIAGNOSIS — R0689 Other abnormalities of breathing: Secondary | ICD-10-CM | POA: Diagnosis not present

## 2015-07-08 DIAGNOSIS — D631 Anemia in chronic kidney disease: Secondary | ICD-10-CM | POA: Diagnosis not present

## 2015-07-08 DIAGNOSIS — E0965 Drug or chemical induced diabetes mellitus with hyperglycemia: Secondary | ICD-10-CM | POA: Diagnosis not present

## 2015-07-08 DIAGNOSIS — I951 Orthostatic hypotension: Secondary | ICD-10-CM | POA: Diagnosis not present

## 2015-07-08 DIAGNOSIS — Z4824 Encounter for aftercare following lung transplant: Secondary | ICD-10-CM | POA: Diagnosis not present

## 2015-07-08 DIAGNOSIS — R918 Other nonspecific abnormal finding of lung field: Secondary | ICD-10-CM | POA: Diagnosis not present

## 2015-07-08 DIAGNOSIS — J156 Pneumonia due to other aerobic Gram-negative bacteria: Secondary | ICD-10-CM | POA: Diagnosis not present

## 2015-07-08 DIAGNOSIS — Z79899 Other long term (current) drug therapy: Secondary | ICD-10-CM | POA: Diagnosis not present

## 2015-07-08 DIAGNOSIS — N186 End stage renal disease: Secondary | ICD-10-CM | POA: Diagnosis not present

## 2015-07-08 DIAGNOSIS — J8410 Pulmonary fibrosis, unspecified: Secondary | ICD-10-CM | POA: Diagnosis not present

## 2015-07-08 DIAGNOSIS — Z942 Lung transplant status: Secondary | ICD-10-CM | POA: Diagnosis not present

## 2015-07-08 DIAGNOSIS — G250 Essential tremor: Secondary | ICD-10-CM | POA: Diagnosis not present

## 2015-07-09 DIAGNOSIS — H35032 Hypertensive retinopathy, left eye: Secondary | ICD-10-CM | POA: Diagnosis not present

## 2015-07-09 DIAGNOSIS — E119 Type 2 diabetes mellitus without complications: Secondary | ICD-10-CM | POA: Diagnosis not present

## 2015-07-09 DIAGNOSIS — H40013 Open angle with borderline findings, low risk, bilateral: Secondary | ICD-10-CM | POA: Diagnosis not present

## 2015-07-09 DIAGNOSIS — H25043 Posterior subcapsular polar age-related cataract, bilateral: Secondary | ICD-10-CM | POA: Diagnosis not present

## 2015-07-09 DIAGNOSIS — H35031 Hypertensive retinopathy, right eye: Secondary | ICD-10-CM | POA: Diagnosis not present

## 2015-07-09 DIAGNOSIS — H04123 Dry eye syndrome of bilateral lacrimal glands: Secondary | ICD-10-CM | POA: Diagnosis not present

## 2015-07-10 DIAGNOSIS — I120 Hypertensive chronic kidney disease with stage 5 chronic kidney disease or end stage renal disease: Secondary | ICD-10-CM | POA: Diagnosis not present

## 2015-07-10 DIAGNOSIS — Z992 Dependence on renal dialysis: Secondary | ICD-10-CM | POA: Diagnosis not present

## 2015-07-10 DIAGNOSIS — N186 End stage renal disease: Secondary | ICD-10-CM | POA: Diagnosis not present

## 2015-07-10 DIAGNOSIS — Z4824 Encounter for aftercare following lung transplant: Secondary | ICD-10-CM | POA: Diagnosis not present

## 2015-07-10 DIAGNOSIS — J156 Pneumonia due to other aerobic Gram-negative bacteria: Secondary | ICD-10-CM | POA: Diagnosis not present

## 2015-07-10 DIAGNOSIS — E1122 Type 2 diabetes mellitus with diabetic chronic kidney disease: Secondary | ICD-10-CM | POA: Diagnosis not present

## 2015-07-10 DIAGNOSIS — R262 Difficulty in walking, not elsewhere classified: Secondary | ICD-10-CM | POA: Diagnosis not present

## 2015-07-10 DIAGNOSIS — D631 Anemia in chronic kidney disease: Secondary | ICD-10-CM | POA: Diagnosis not present

## 2015-07-13 DIAGNOSIS — Z4824 Encounter for aftercare following lung transplant: Secondary | ICD-10-CM | POA: Diagnosis not present

## 2015-07-13 DIAGNOSIS — D631 Anemia in chronic kidney disease: Secondary | ICD-10-CM | POA: Diagnosis not present

## 2015-07-13 DIAGNOSIS — N186 End stage renal disease: Secondary | ICD-10-CM | POA: Diagnosis not present

## 2015-07-13 DIAGNOSIS — E1122 Type 2 diabetes mellitus with diabetic chronic kidney disease: Secondary | ICD-10-CM | POA: Diagnosis not present

## 2015-07-13 DIAGNOSIS — R262 Difficulty in walking, not elsewhere classified: Secondary | ICD-10-CM | POA: Diagnosis not present

## 2015-07-13 DIAGNOSIS — I120 Hypertensive chronic kidney disease with stage 5 chronic kidney disease or end stage renal disease: Secondary | ICD-10-CM | POA: Diagnosis not present

## 2015-07-13 DIAGNOSIS — J156 Pneumonia due to other aerobic Gram-negative bacteria: Secondary | ICD-10-CM | POA: Diagnosis not present

## 2015-07-13 DIAGNOSIS — Z992 Dependence on renal dialysis: Secondary | ICD-10-CM | POA: Diagnosis not present

## 2015-07-14 DIAGNOSIS — Z4824 Encounter for aftercare following lung transplant: Secondary | ICD-10-CM | POA: Diagnosis not present

## 2015-07-14 DIAGNOSIS — R262 Difficulty in walking, not elsewhere classified: Secondary | ICD-10-CM | POA: Diagnosis not present

## 2015-07-15 DIAGNOSIS — J156 Pneumonia due to other aerobic Gram-negative bacteria: Secondary | ICD-10-CM | POA: Diagnosis not present

## 2015-07-15 DIAGNOSIS — R262 Difficulty in walking, not elsewhere classified: Secondary | ICD-10-CM | POA: Diagnosis not present

## 2015-07-15 DIAGNOSIS — Z4824 Encounter for aftercare following lung transplant: Secondary | ICD-10-CM | POA: Diagnosis not present

## 2015-07-15 DIAGNOSIS — Z992 Dependence on renal dialysis: Secondary | ICD-10-CM | POA: Diagnosis not present

## 2015-07-15 DIAGNOSIS — E1122 Type 2 diabetes mellitus with diabetic chronic kidney disease: Secondary | ICD-10-CM | POA: Diagnosis not present

## 2015-07-15 DIAGNOSIS — D631 Anemia in chronic kidney disease: Secondary | ICD-10-CM | POA: Diagnosis not present

## 2015-07-15 DIAGNOSIS — I120 Hypertensive chronic kidney disease with stage 5 chronic kidney disease or end stage renal disease: Secondary | ICD-10-CM | POA: Diagnosis not present

## 2015-07-15 DIAGNOSIS — N186 End stage renal disease: Secondary | ICD-10-CM | POA: Diagnosis not present

## 2015-07-16 DIAGNOSIS — N186 End stage renal disease: Secondary | ICD-10-CM | POA: Diagnosis not present

## 2015-07-16 DIAGNOSIS — R262 Difficulty in walking, not elsewhere classified: Secondary | ICD-10-CM | POA: Diagnosis not present

## 2015-07-16 DIAGNOSIS — Z4824 Encounter for aftercare following lung transplant: Secondary | ICD-10-CM | POA: Diagnosis not present

## 2015-07-17 DIAGNOSIS — N186 End stage renal disease: Secondary | ICD-10-CM | POA: Diagnosis not present

## 2015-07-17 DIAGNOSIS — Z992 Dependence on renal dialysis: Secondary | ICD-10-CM | POA: Diagnosis not present

## 2015-07-17 DIAGNOSIS — J156 Pneumonia due to other aerobic Gram-negative bacteria: Secondary | ICD-10-CM | POA: Diagnosis not present

## 2015-07-17 DIAGNOSIS — R262 Difficulty in walking, not elsewhere classified: Secondary | ICD-10-CM | POA: Diagnosis not present

## 2015-07-17 DIAGNOSIS — D631 Anemia in chronic kidney disease: Secondary | ICD-10-CM | POA: Diagnosis not present

## 2015-07-17 DIAGNOSIS — E1122 Type 2 diabetes mellitus with diabetic chronic kidney disease: Secondary | ICD-10-CM | POA: Diagnosis not present

## 2015-07-17 DIAGNOSIS — Z4824 Encounter for aftercare following lung transplant: Secondary | ICD-10-CM | POA: Diagnosis not present

## 2015-07-17 DIAGNOSIS — I120 Hypertensive chronic kidney disease with stage 5 chronic kidney disease or end stage renal disease: Secondary | ICD-10-CM | POA: Diagnosis not present

## 2015-07-20 DIAGNOSIS — N186 End stage renal disease: Secondary | ICD-10-CM | POA: Diagnosis not present

## 2015-07-20 DIAGNOSIS — I120 Hypertensive chronic kidney disease with stage 5 chronic kidney disease or end stage renal disease: Secondary | ICD-10-CM | POA: Diagnosis not present

## 2015-07-20 DIAGNOSIS — D631 Anemia in chronic kidney disease: Secondary | ICD-10-CM | POA: Diagnosis not present

## 2015-07-20 DIAGNOSIS — E1122 Type 2 diabetes mellitus with diabetic chronic kidney disease: Secondary | ICD-10-CM | POA: Diagnosis not present

## 2015-07-20 DIAGNOSIS — Z992 Dependence on renal dialysis: Secondary | ICD-10-CM | POA: Diagnosis not present

## 2015-07-20 DIAGNOSIS — J156 Pneumonia due to other aerobic Gram-negative bacteria: Secondary | ICD-10-CM | POA: Diagnosis not present

## 2015-07-21 DIAGNOSIS — Z942 Lung transplant status: Secondary | ICD-10-CM | POA: Diagnosis not present

## 2015-07-21 DIAGNOSIS — Z136 Encounter for screening for cardiovascular disorders: Secondary | ICD-10-CM | POA: Diagnosis not present

## 2015-07-22 DIAGNOSIS — N186 End stage renal disease: Secondary | ICD-10-CM | POA: Diagnosis not present

## 2015-07-22 DIAGNOSIS — J156 Pneumonia due to other aerobic Gram-negative bacteria: Secondary | ICD-10-CM | POA: Diagnosis not present

## 2015-07-22 DIAGNOSIS — E1122 Type 2 diabetes mellitus with diabetic chronic kidney disease: Secondary | ICD-10-CM | POA: Diagnosis not present

## 2015-07-22 DIAGNOSIS — D631 Anemia in chronic kidney disease: Secondary | ICD-10-CM | POA: Diagnosis not present

## 2015-07-22 DIAGNOSIS — I120 Hypertensive chronic kidney disease with stage 5 chronic kidney disease or end stage renal disease: Secondary | ICD-10-CM | POA: Diagnosis not present

## 2015-07-22 DIAGNOSIS — Z992 Dependence on renal dialysis: Secondary | ICD-10-CM | POA: Diagnosis not present

## 2015-07-24 DIAGNOSIS — I120 Hypertensive chronic kidney disease with stage 5 chronic kidney disease or end stage renal disease: Secondary | ICD-10-CM | POA: Diagnosis not present

## 2015-07-24 DIAGNOSIS — D631 Anemia in chronic kidney disease: Secondary | ICD-10-CM | POA: Diagnosis not present

## 2015-07-24 DIAGNOSIS — E1122 Type 2 diabetes mellitus with diabetic chronic kidney disease: Secondary | ICD-10-CM | POA: Diagnosis not present

## 2015-07-24 DIAGNOSIS — J156 Pneumonia due to other aerobic Gram-negative bacteria: Secondary | ICD-10-CM | POA: Diagnosis not present

## 2015-07-24 DIAGNOSIS — Z992 Dependence on renal dialysis: Secondary | ICD-10-CM | POA: Diagnosis not present

## 2015-07-24 DIAGNOSIS — N186 End stage renal disease: Secondary | ICD-10-CM | POA: Diagnosis not present

## 2015-07-27 DIAGNOSIS — J156 Pneumonia due to other aerobic Gram-negative bacteria: Secondary | ICD-10-CM | POA: Diagnosis not present

## 2015-07-27 DIAGNOSIS — I120 Hypertensive chronic kidney disease with stage 5 chronic kidney disease or end stage renal disease: Secondary | ICD-10-CM | POA: Diagnosis not present

## 2015-07-27 DIAGNOSIS — E1122 Type 2 diabetes mellitus with diabetic chronic kidney disease: Secondary | ICD-10-CM | POA: Diagnosis not present

## 2015-07-27 DIAGNOSIS — N186 End stage renal disease: Secondary | ICD-10-CM | POA: Diagnosis not present

## 2015-07-27 DIAGNOSIS — Z992 Dependence on renal dialysis: Secondary | ICD-10-CM | POA: Diagnosis not present

## 2015-07-27 DIAGNOSIS — D631 Anemia in chronic kidney disease: Secondary | ICD-10-CM | POA: Diagnosis not present

## 2015-07-29 DIAGNOSIS — J156 Pneumonia due to other aerobic Gram-negative bacteria: Secondary | ICD-10-CM | POA: Diagnosis not present

## 2015-07-29 DIAGNOSIS — K219 Gastro-esophageal reflux disease without esophagitis: Secondary | ICD-10-CM | POA: Diagnosis not present

## 2015-07-29 DIAGNOSIS — Z992 Dependence on renal dialysis: Secondary | ICD-10-CM | POA: Diagnosis not present

## 2015-07-29 DIAGNOSIS — N185 Chronic kidney disease, stage 5: Secondary | ICD-10-CM | POA: Diagnosis not present

## 2015-07-29 DIAGNOSIS — Z942 Lung transplant status: Secondary | ICD-10-CM | POA: Diagnosis not present

## 2015-07-29 DIAGNOSIS — D631 Anemia in chronic kidney disease: Secondary | ICD-10-CM | POA: Diagnosis not present

## 2015-07-29 DIAGNOSIS — N186 End stage renal disease: Secondary | ICD-10-CM | POA: Diagnosis not present

## 2015-07-29 DIAGNOSIS — D899 Disorder involving the immune mechanism, unspecified: Secondary | ICD-10-CM | POA: Diagnosis not present

## 2015-07-29 DIAGNOSIS — I120 Hypertensive chronic kidney disease with stage 5 chronic kidney disease or end stage renal disease: Secondary | ICD-10-CM | POA: Diagnosis not present

## 2015-07-29 DIAGNOSIS — E1122 Type 2 diabetes mellitus with diabetic chronic kidney disease: Secondary | ICD-10-CM | POA: Diagnosis not present

## 2015-07-29 DIAGNOSIS — Z4824 Encounter for aftercare following lung transplant: Secondary | ICD-10-CM | POA: Diagnosis not present

## 2015-07-29 DIAGNOSIS — R739 Hyperglycemia, unspecified: Secondary | ICD-10-CM | POA: Diagnosis not present

## 2015-07-30 DIAGNOSIS — Z79899 Other long term (current) drug therapy: Secondary | ICD-10-CM | POA: Diagnosis not present

## 2015-07-30 DIAGNOSIS — Z9889 Other specified postprocedural states: Secondary | ICD-10-CM | POA: Diagnosis not present

## 2015-07-30 DIAGNOSIS — E1122 Type 2 diabetes mellitus with diabetic chronic kidney disease: Secondary | ICD-10-CM | POA: Diagnosis not present

## 2015-07-30 DIAGNOSIS — Z4824 Encounter for aftercare following lung transplant: Secondary | ICD-10-CM | POA: Diagnosis not present

## 2015-07-30 DIAGNOSIS — D631 Anemia in chronic kidney disease: Secondary | ICD-10-CM | POA: Diagnosis not present

## 2015-07-30 DIAGNOSIS — Z7982 Long term (current) use of aspirin: Secondary | ICD-10-CM | POA: Diagnosis not present

## 2015-07-30 DIAGNOSIS — Z881 Allergy status to other antibiotic agents status: Secondary | ICD-10-CM | POA: Diagnosis not present

## 2015-07-30 DIAGNOSIS — E785 Hyperlipidemia, unspecified: Secondary | ICD-10-CM | POA: Diagnosis not present

## 2015-07-30 DIAGNOSIS — T86818 Other complications of lung transplant: Secondary | ICD-10-CM | POA: Diagnosis not present

## 2015-07-30 DIAGNOSIS — R0989 Other specified symptoms and signs involving the circulatory and respiratory systems: Secondary | ICD-10-CM | POA: Diagnosis not present

## 2015-07-30 DIAGNOSIS — G4733 Obstructive sleep apnea (adult) (pediatric): Secondary | ICD-10-CM | POA: Diagnosis not present

## 2015-07-30 DIAGNOSIS — Z8709 Personal history of other diseases of the respiratory system: Secondary | ICD-10-CM | POA: Diagnosis not present

## 2015-07-30 DIAGNOSIS — J9809 Other diseases of bronchus, not elsewhere classified: Secondary | ICD-10-CM | POA: Diagnosis not present

## 2015-07-30 DIAGNOSIS — K219 Gastro-esophageal reflux disease without esophagitis: Secondary | ICD-10-CM | POA: Diagnosis not present

## 2015-07-30 DIAGNOSIS — Z7952 Long term (current) use of systemic steroids: Secondary | ICD-10-CM | POA: Diagnosis not present

## 2015-07-30 DIAGNOSIS — Z886 Allergy status to analgesic agent status: Secondary | ICD-10-CM | POA: Diagnosis not present

## 2015-07-30 DIAGNOSIS — Z792 Long term (current) use of antibiotics: Secondary | ICD-10-CM | POA: Diagnosis not present

## 2015-07-30 DIAGNOSIS — N186 End stage renal disease: Secondary | ICD-10-CM | POA: Diagnosis not present

## 2015-07-30 DIAGNOSIS — Z942 Lung transplant status: Secondary | ICD-10-CM | POA: Diagnosis not present

## 2015-07-30 DIAGNOSIS — I120 Hypertensive chronic kidney disease with stage 5 chronic kidney disease or end stage renal disease: Secondary | ICD-10-CM | POA: Diagnosis not present

## 2015-07-30 DIAGNOSIS — Z794 Long term (current) use of insulin: Secondary | ICD-10-CM | POA: Diagnosis not present

## 2015-07-31 DIAGNOSIS — J156 Pneumonia due to other aerobic Gram-negative bacteria: Secondary | ICD-10-CM | POA: Diagnosis not present

## 2015-07-31 DIAGNOSIS — Z992 Dependence on renal dialysis: Secondary | ICD-10-CM | POA: Diagnosis not present

## 2015-07-31 DIAGNOSIS — N186 End stage renal disease: Secondary | ICD-10-CM | POA: Diagnosis not present

## 2015-07-31 DIAGNOSIS — I120 Hypertensive chronic kidney disease with stage 5 chronic kidney disease or end stage renal disease: Secondary | ICD-10-CM | POA: Diagnosis not present

## 2015-07-31 DIAGNOSIS — D631 Anemia in chronic kidney disease: Secondary | ICD-10-CM | POA: Diagnosis not present

## 2015-07-31 DIAGNOSIS — E1122 Type 2 diabetes mellitus with diabetic chronic kidney disease: Secondary | ICD-10-CM | POA: Diagnosis not present

## 2015-08-03 DIAGNOSIS — E1122 Type 2 diabetes mellitus with diabetic chronic kidney disease: Secondary | ICD-10-CM | POA: Diagnosis not present

## 2015-08-03 DIAGNOSIS — Z992 Dependence on renal dialysis: Secondary | ICD-10-CM | POA: Diagnosis not present

## 2015-08-03 DIAGNOSIS — N186 End stage renal disease: Secondary | ICD-10-CM | POA: Diagnosis not present

## 2015-08-03 DIAGNOSIS — J156 Pneumonia due to other aerobic Gram-negative bacteria: Secondary | ICD-10-CM | POA: Diagnosis not present

## 2015-08-03 DIAGNOSIS — D631 Anemia in chronic kidney disease: Secondary | ICD-10-CM | POA: Diagnosis not present

## 2015-08-03 DIAGNOSIS — I120 Hypertensive chronic kidney disease with stage 5 chronic kidney disease or end stage renal disease: Secondary | ICD-10-CM | POA: Diagnosis not present

## 2015-08-05 DIAGNOSIS — N186 End stage renal disease: Secondary | ICD-10-CM | POA: Diagnosis not present

## 2015-08-05 DIAGNOSIS — D631 Anemia in chronic kidney disease: Secondary | ICD-10-CM | POA: Diagnosis not present

## 2015-08-05 DIAGNOSIS — I951 Orthostatic hypotension: Secondary | ICD-10-CM | POA: Diagnosis not present

## 2015-08-05 DIAGNOSIS — J156 Pneumonia due to other aerobic Gram-negative bacteria: Secondary | ICD-10-CM | POA: Diagnosis not present

## 2015-08-06 DIAGNOSIS — H4063X1 Glaucoma secondary to drugs, bilateral, mild stage: Secondary | ICD-10-CM | POA: Diagnosis not present

## 2015-08-06 DIAGNOSIS — H40043 Steroid responder, bilateral: Secondary | ICD-10-CM | POA: Diagnosis not present

## 2015-08-07 DIAGNOSIS — J156 Pneumonia due to other aerobic Gram-negative bacteria: Secondary | ICD-10-CM | POA: Diagnosis not present

## 2015-08-07 DIAGNOSIS — D631 Anemia in chronic kidney disease: Secondary | ICD-10-CM | POA: Diagnosis not present

## 2015-08-07 DIAGNOSIS — N186 End stage renal disease: Secondary | ICD-10-CM | POA: Diagnosis not present

## 2015-08-07 DIAGNOSIS — I951 Orthostatic hypotension: Secondary | ICD-10-CM | POA: Diagnosis not present

## 2015-08-10 DIAGNOSIS — D631 Anemia in chronic kidney disease: Secondary | ICD-10-CM | POA: Diagnosis not present

## 2015-08-10 DIAGNOSIS — D509 Iron deficiency anemia, unspecified: Secondary | ICD-10-CM | POA: Diagnosis not present

## 2015-08-10 DIAGNOSIS — N2581 Secondary hyperparathyroidism of renal origin: Secondary | ICD-10-CM | POA: Diagnosis not present

## 2015-08-10 DIAGNOSIS — N186 End stage renal disease: Secondary | ICD-10-CM | POA: Diagnosis not present

## 2015-08-10 DIAGNOSIS — R6883 Chills (without fever): Secondary | ICD-10-CM | POA: Diagnosis not present

## 2015-08-12 DIAGNOSIS — N2581 Secondary hyperparathyroidism of renal origin: Secondary | ICD-10-CM | POA: Diagnosis not present

## 2015-08-12 DIAGNOSIS — R6883 Chills (without fever): Secondary | ICD-10-CM | POA: Diagnosis not present

## 2015-08-12 DIAGNOSIS — N186 End stage renal disease: Secondary | ICD-10-CM | POA: Diagnosis not present

## 2015-08-12 DIAGNOSIS — D509 Iron deficiency anemia, unspecified: Secondary | ICD-10-CM | POA: Diagnosis not present

## 2015-08-12 DIAGNOSIS — D631 Anemia in chronic kidney disease: Secondary | ICD-10-CM | POA: Diagnosis not present

## 2015-08-13 DIAGNOSIS — N186 End stage renal disease: Secondary | ICD-10-CM | POA: Diagnosis not present

## 2015-08-14 DIAGNOSIS — N2581 Secondary hyperparathyroidism of renal origin: Secondary | ICD-10-CM | POA: Diagnosis not present

## 2015-08-14 DIAGNOSIS — D509 Iron deficiency anemia, unspecified: Secondary | ICD-10-CM | POA: Diagnosis not present

## 2015-08-14 DIAGNOSIS — D631 Anemia in chronic kidney disease: Secondary | ICD-10-CM | POA: Diagnosis not present

## 2015-08-14 DIAGNOSIS — R6883 Chills (without fever): Secondary | ICD-10-CM | POA: Diagnosis not present

## 2015-08-14 DIAGNOSIS — N186 End stage renal disease: Secondary | ICD-10-CM | POA: Diagnosis not present

## 2015-08-17 DIAGNOSIS — N186 End stage renal disease: Secondary | ICD-10-CM | POA: Diagnosis not present

## 2015-08-17 DIAGNOSIS — D631 Anemia in chronic kidney disease: Secondary | ICD-10-CM | POA: Diagnosis not present

## 2015-08-17 DIAGNOSIS — R6883 Chills (without fever): Secondary | ICD-10-CM | POA: Diagnosis not present

## 2015-08-17 DIAGNOSIS — D509 Iron deficiency anemia, unspecified: Secondary | ICD-10-CM | POA: Diagnosis not present

## 2015-08-17 DIAGNOSIS — N2581 Secondary hyperparathyroidism of renal origin: Secondary | ICD-10-CM | POA: Diagnosis not present

## 2015-08-19 DIAGNOSIS — N2581 Secondary hyperparathyroidism of renal origin: Secondary | ICD-10-CM | POA: Diagnosis not present

## 2015-08-19 DIAGNOSIS — N186 End stage renal disease: Secondary | ICD-10-CM | POA: Diagnosis not present

## 2015-08-19 DIAGNOSIS — D509 Iron deficiency anemia, unspecified: Secondary | ICD-10-CM | POA: Diagnosis not present

## 2015-08-19 DIAGNOSIS — D631 Anemia in chronic kidney disease: Secondary | ICD-10-CM | POA: Diagnosis not present

## 2015-08-19 DIAGNOSIS — R6883 Chills (without fever): Secondary | ICD-10-CM | POA: Diagnosis not present

## 2015-08-20 DIAGNOSIS — N186 End stage renal disease: Secondary | ICD-10-CM | POA: Diagnosis not present

## 2015-08-20 DIAGNOSIS — Z992 Dependence on renal dialysis: Secondary | ICD-10-CM | POA: Diagnosis not present

## 2015-08-20 DIAGNOSIS — T86819 Unspecified complication of lung transplant: Secondary | ICD-10-CM | POA: Diagnosis not present

## 2015-08-21 ENCOUNTER — Telehealth: Payer: Self-pay | Admitting: *Deleted

## 2015-08-21 ENCOUNTER — Ambulatory Visit (INDEPENDENT_AMBULATORY_CARE_PROVIDER_SITE_OTHER): Payer: Medicare Other

## 2015-08-21 ENCOUNTER — Ambulatory Visit (INDEPENDENT_AMBULATORY_CARE_PROVIDER_SITE_OTHER): Payer: Medicare Other | Admitting: Internal Medicine

## 2015-08-21 VITALS — BP 118/76 | HR 89 | Temp 98.0°F | Resp 17

## 2015-08-21 DIAGNOSIS — J3489 Other specified disorders of nose and nasal sinuses: Secondary | ICD-10-CM | POA: Diagnosis not present

## 2015-08-21 DIAGNOSIS — R6883 Chills (without fever): Secondary | ICD-10-CM | POA: Diagnosis not present

## 2015-08-21 DIAGNOSIS — D631 Anemia in chronic kidney disease: Secondary | ICD-10-CM | POA: Diagnosis not present

## 2015-08-21 DIAGNOSIS — N186 End stage renal disease: Secondary | ICD-10-CM | POA: Diagnosis not present

## 2015-08-21 DIAGNOSIS — N2581 Secondary hyperparathyroidism of renal origin: Secondary | ICD-10-CM | POA: Diagnosis not present

## 2015-08-21 DIAGNOSIS — D509 Iron deficiency anemia, unspecified: Secondary | ICD-10-CM | POA: Diagnosis not present

## 2015-08-21 DIAGNOSIS — R918 Other nonspecific abnormal finding of lung field: Secondary | ICD-10-CM | POA: Diagnosis not present

## 2015-08-21 LAB — POCT INFLUENZA A/B
Influenza A, POC: NEGATIVE
Influenza B, POC: NEGATIVE

## 2015-08-21 NOTE — Patient Instructions (Signed)
FINDINGS: Normal heart size, mediastinal contours and pulmonary vascularity.  Chronic interstitial changes throughout LEFT lung consistent with fibrosis.  Well expanded RIGHT lung with minimal residual RIGHT basilar atelectasis versus scarring. xray= Improved RIGHT basilar infiltrate.  Question tiny RIGHT pleural effusion.  No pneumothorax.  Bones demineralized.  New RIGHT and RIGHT diaphragmatic dual-lumen catheter with distal tip projecting over superior cavoatrial junction  IMPRESSION: Improved RIGHT basilar infiltrate since previous exam with residual RIGHT basilar atelectasis and questionable small pleural effusion.  Chronic interstitial lung disease LEFT lung with prior RIGHT lung transplant.

## 2015-08-21 NOTE — Progress Notes (Signed)
Subjective:  By signing my name below, I, Raven Small, attest that this documentation has been prepared under the direction and in the presence of Tami Lin, MD.  Electronically Signed: Thea Alken, ED Scribe. 08/21/2015. 5:38 PM.   Patient ID: [name], male    DOB: 05-Jun-1951, 65 y.o.   MRN: [id number]  HPIcalled to see urgently because of ref from dialysis center and pulm complaint plus possible sepsis 1st [place]  Chief Complaint  Patient presents with  . Chills during dialysis today  . Shortness of Breath    HPI Comments: [name] is a 65 y.o. male with who presents to the [facility] complaining of chills. Pt has hx of right lung transplant and is followed by [facility]. 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   Also on D 3x wk ren fail. During dialysis today he was experiencing shivers/chills. His temperature at that time was 98.7 which his wife states was higher than what it was this morning which was 96-97. Pt states he usually feels cold after dialysis but this worse. Has been happening last 6-7 times at dialysis.  His coordinator would like pt to be swabbed for flu and obtain a CXR to rule out pneumonia. Sends  todays info plus past [facility] notes but no notes re exam or vitals today and no CBC on todays,s labs.  Pt reports nasal drainage started this morning  But no other change in pulm condition  Pt also feels a pulling sensation in right chest 3d- Pt states he may have strained a muscle while carrying heavy objects.   Patient Active Problem List   Diagnosis Date Noted  . Acute respiratory failure with hypoxemia ([place]) 04/18/2015  . Septic shock ([place]) 04/18/2015  . Acute encephalopathy 04/18/2015  . Acute respiratory failure ([place]) 04/18/2015  . Cardiac arrest ([place])   . HCAP (healthcare-associated pneumonia)   . Elevated rheumatoid  factor 05/09/2014  . Unspecified essential hypertension 05/09/2014  . ILD (interstitial lung disease) ([place]) 05/09/2014  . Obstructive apnea 05/09/2014  . Lung nodule, solitary 04/18/2014  . Awaiting organ transplant 04/18/2014  . Acute on chronic respiratory failure with hypoxia ([place]) 08/15/2013  . Edema 07/05/2013  . Chronic respiratory failure ([place]) 03/03/2013  . Diabetes mellitus ([place]) 03/03/2013  . Acute sinusitis 05/04/2011  . Cough 11/10/2010  . 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 Obstructive sleep apnea 10/09/2008  . ACID REFLUX DISEASE 10/09/2008  . HIATAL HERNIA 10/09/2008  . OSTEOARTHRITIS 10/09/2008   Past Surgical History  Procedure Laterality Date  . Lung biopsy  2010  . Lung transplant, single Right    Past Medical History  Diagnosis Date  . Benign neoplasm of colon   . Aortic valve disorders   . Osteoarthrosis, unspecified whether generalized or localized, unspecified site   . Degeneration of intervertebral disc, site unspecified   . Other and unspecified hyperlipidemia   . Unspecified essential hypertension   . Obstructive sleep apnea (adult) (pediatric)   . Esophageal reflux   . 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 Pneumonia     interstitial pneumonia  . Diabetes mellitus without complication ([place])   . Renal disorder   . Transplanted, lung ([place])    Prior to Admission medications   Medication Sig Start Date End Date Taking? Authorizing Provider  acetaminophen (TYLENOL) 160 MG/5ML solution Place  20.3 mLs (650 mg total) into feeding tube every 6 (six) hours as needed for mild pain, headache or fever. 04/19/15  Yes Erick Colace, NP  acetaminophen (TYLENOL) 500 MG tablet Take 500 mg by mouth every 6 (six) hours as needed for fever or headache.   Yes Historical Provider, MD  aspirin 81 MG tablet Take 81 mg by mouth daily.    Yes Historical Provider, MD  azaTHIOprine (IMURAN) 50 MG tablet take 3 tablets by  mouth once daily 11/26/13  Yes Kathee Delton, MD  chlorhexidine gluconate (PERIDEX) 0.12 % solution 15 mLs by Mouth Rinse route 2 (two) times daily. 04/19/15  Yes Erick Colace, NP  cycloSPORINE modified (NEORAL) 100 MG capsule Take 1 capsule by mouth daily. 03/09/15 04/08/16 Yes Historical Provider, MD  fentaNYL 2,500 mcg in sodium chloride 0.9 % 200 mL Titrate as needed 04/18/15  Yes Erick Colace, NP  fluticasone [facility]) 50 MCG/ACT nasal spray Place 2 sprays into the nose daily. Patient taking differently: Place 2 sprays into the nose as needed.  03/05/13  Yes Wenda Low, MD  insulin aspart (NOVOLOG) 100 UNIT/ML injection Inject 2-6 Units into the skin every 4 (four) hours. 04/18/15  Yes Erick Colace, NP  Multiple Vitamin (MULTIVITAMIN) capsule Take 1 capsule by mouth daily.    Yes Historical Provider, MD  omeprazole (PRILOSEC) 20 MG capsule Take 20 mg by mouth daily.   Yes Historical Provider, MD  pravastatin (PRAVACHOL) 20 MG tablet Take 20 mg by mouth daily.   Yes Historical Provider, MD  predniSONE (DELTASONE) 5 MG tablet Take 3.5 tablets daily as directed Patient taking differently: 15 mg. 3 tablets by mouth daily 12/02/13  Yes Kathee Delton, MD  sertraline (ZOLOFT) 50 MG tablet Take 50 mg by mouth daily.   Yes Historical Provider, MD  sulfamethoxazole-trimethoprim (BACTRIM DS,SEPTRA DS) 800-160 MG per tablet Take 1 tablet by mouth daily. Patient taking differently: Take 1 tablet by mouth daily. Take one tablet by mouth on mondays, wednesdays and fridays 05/26/14  Yes Kathee Delton, MD  valGANciclovir (VALCYTE) 450 MG tablet Take 1 tablet by mouth every Monday. Patient takes one tablet every Monday and Friday after dialysis 03/26/15  Yes Historical Provider, MD  anidulafungin 100 mg in sodium chloride 0.9 % 100 mL Inject 100 mg into the vein daily. Patient not taking: Reported on 08/21/2015 04/19/15   Erick Colace, NP  azithromycin 500 mg in dextrose 5 % 250 mL Inject 500 mg  into the vein daily. Patient not taking: Reported on 08/21/2015 04/18/15   Erick Colace, NP  heparin 5000 UNIT/ML injection Inject 1 mL (5,000 Units total) into the skin every 8 (eight) hours. Patient not taking: Reported on 08/21/2015 04/18/15   Erick Colace, NP  Insulin Detemir (LEVEMIR FLEXPEN [place]) Reported on 08/21/2015 07/22/14   Historical Provider, MD  insulin regular (NOVOLIN R,HUMULIN R) 100 units/mL injection Inject 8 Units into the skin 3 (three) times daily before meals. Reported on 08/21/2015 03/09/15   Historical Provider, MD  midazolam (VERSED) 2 MG/2ML SOLN injection Inject 1-2 mLs (1-2 mg total) into the vein every 2 (two) hours as needed for agitation. Patient not taking: Reported on 08/21/2015 04/18/15   Erick Colace, NP  norepinephrine 16 mg in dextrose 5 % 250 mL Titrate for MAP > 65 Patient not taking: Reported on 08/21/2015 04/19/15   Erick Colace, NP  norepinephrine 4 mg in dextrose 5 % 250 mL Inject 2-50 mcg/min  into the vein continuous. Patient not taking: Reported on 08/21/2015 04/18/15   Erick Colace, NP  pantoprazole (PROTONIX) 40 MG injection Inject 40 mg into the vein daily. Patient not taking: Reported on 08/21/2015 04/18/15   Erick Colace, NP  piperacillin-tazobactam (ZOSYN) 2-0.25 GM/50ML IVPB Inject 50 mLs (2.25 g total) into the vein every 12 (twelve) hours. Patient not taking: Reported on 08/21/2015 04/18/15   Erick Colace, NP  piperacillin-tazobactam (ZOSYN) 3-0.375 GM/50ML IVPB Inject 50 mLs (3.375 g total) into the vein every 6 (six) hours. Patient not taking: Reported on 08/21/2015 04/19/15   Erick Colace, NP  sodium chloride 0.9 % infusion kvo ivfs Patient not taking: Reported on 08/21/2015 04/19/15   Erick Colace, NP  sodium chloride 0.9 % Inject 1,000 mLs into the vein every hour. Patient not taking: Reported on 08/21/2015 04/18/15   Erick Colace, NP  Vancomycin (VANCOCIN) 750 MG/150ML SOLN Inject 150 mLs (750 mg total) into the vein  daily. Patient not taking: Reported on 08/21/2015 04/20/15   Erick Colace, NP  vasopressin 40 Units in sodium chloride 0.9 % 250 mL Inject 0.03 Units/min into the vein continuous. Patient not taking: Reported on 08/21/2015 04/18/15   Erick Colace, NP    Review of Systems  Constitutional: Positive for chills. Negative for fever.  HENT: Positive for rhinorrhea.   Musculoskeletal: Positive for myalgias.       Objective:   Physical Exam  Constitutional: He is oriented to person, place, and time. He appears well-developed and well-nourished. No distress.  HENT:  Head: Normocephalic and atraumatic.  Nose: Nose normal.  Mouth/Throat: Oropharynx is clear and moist. No oropharyngeal exudate.  Eyes: Conjunctivae and EOM are normal. Pupils are equal, round, and reactive to light.  Neck: Neck supple. No thyromegaly present.  Cardiovascular: Normal rate, regular rhythm, normal heart sounds and intact distal pulses.   No murmur heard. Pulmonary/Chest: Effort normal and breath sounds normal. No respiratory distress.  Moves air on both sides but has very distant breath sounds.  Abdominal: Soft. There is no tenderness.  Genitourinary:  No cellulitis at vascular access point groin  Musculoskeletal: Normal range of motion. He exhibits no edema.  Lymphadenopathy:    He has no cervical adenopathy.  Neurological: He is alert and oriented to person, place, and time. No cranial nerve deficit.  Skin: Skin is warm and dry.  Psychiatric: He has a normal mood and affect. His behavior is normal. Thought content normal.  Nursing note and vitals reviewed.  Filed Vitals:   08/21/15 1650  BP: 118/76  Pulse: 89  Temp: 98 F (36.7 C)  TempSrc: Oral  Resp: 17  SpO2: 94%   Results for orders placed or performed in visit on 08/21/15  POCT Influenza A/B  Result Value Ref Range   Influenza A, POC Negative Negative   Influenza B, POC Negative Negative   [facility] reading (PRIMARY) by Dr. Laney Pastor No  active pneumonia on right side only chronic changes   Dg Chest 2 View  08/21/2015  CLINICAL DATA:  Chills following dialysis treatment, happens with a each treatment and last 1-2 days; history end-stage renal disease, prior single lung transplant, diabetes mellitus, essential hypertension EXAM: CHEST  2 VIEW COMPARISON:  04/19/2015 FINDINGS: Normal heart size, mediastinal contours and pulmonary vascularity. Chronic interstitial changes throughout LEFT lung consistent with fibrosis. Well expanded RIGHT lung with minimal residual RIGHT basilar atelectasis versus scarring. Improved RIGHT basilar infiltrate. Question tiny RIGHT pleural effusion. No pneumothorax.  Bones demineralized. New RIGHT and RIGHT diaphragmatic dual-lumen catheter with distal tip projecting over superior cavoatrial junction IMPRESSION: Improved RIGHT basilar infiltrate since previous exam with residual RIGHT basilar atelectasis and questionable small pleural effusion. Chronic interstitial lung disease LEFT lung with prior RIGHT lung transplant. Tip of RIGHT infra diaphragmatic line projects over the superior cavoatrial junction ; this would need withdrawn 6 cm if positioning of the tip at the inferior cardiac margin was desired. These results will be called to the ordering clinician or representative by the Radiologist Assistant, and communication documented in the PACS or zVision Dashboard. Electronically Signed   By: Lavonia Dana M.D.   On: 08/21/2015 17:34  NO REAL CHANGE!  Assessment & Plan:    1. Chills during dialysis  2. Other chr probs as noted   No evidence for bacteremia/underlying infection that might warrant usual protocol of Vancomycin IV after blood cults(which were done at daily today) so will refrain from any intervention other ythan observation for fever/other sxt  Seems than environmental cause like temp of machine or room is possible considering number of occurreces. He has recent nl TSH. Anemia not  pronounced. Orders Placed This Encounter  Procedures  . DG Chest 2 View    Standing Status: Future     Number of Occurrences: 1     Standing Expiration Date: 08/20/2016    Order Specific Question:  Reason for Exam (SYMPTOM  OR DIAGNOSIS REQUIRED)    Answer:  chills--s/p lung transplant    Order Specific Question:  Preferred imaging location?    Answer:  External  . POCT Influenza A/B    I have completed the patient encounter in its entirety as documented by the scribe, with editing by me where necessary. Domnic Vantol P. Laney Pastor, M.D.

## 2015-08-21 NOTE — Telephone Encounter (Signed)
Received 20 pages from [facility] today

## 2015-08-24 ENCOUNTER — Telehealth: Payer: Self-pay

## 2015-08-24 DIAGNOSIS — N2581 Secondary hyperparathyroidism of renal origin: Secondary | ICD-10-CM | POA: Diagnosis not present

## 2015-08-24 DIAGNOSIS — R6883 Chills (without fever): Secondary | ICD-10-CM | POA: Diagnosis not present

## 2015-08-24 DIAGNOSIS — D509 Iron deficiency anemia, unspecified: Secondary | ICD-10-CM | POA: Diagnosis not present

## 2015-08-24 DIAGNOSIS — D631 Anemia in chronic kidney disease: Secondary | ICD-10-CM | POA: Diagnosis not present

## 2015-08-24 DIAGNOSIS — N186 End stage renal disease: Secondary | ICD-10-CM | POA: Diagnosis not present

## 2015-08-24 NOTE — Telephone Encounter (Signed)
We received 21 pages of records from [facility]. I have labeled them and sent them to the scan center on 08/24/15

## 2015-08-26 DIAGNOSIS — R6883 Chills (without fever): Secondary | ICD-10-CM | POA: Diagnosis not present

## 2015-08-26 DIAGNOSIS — D509 Iron deficiency anemia, unspecified: Secondary | ICD-10-CM | POA: Diagnosis not present

## 2015-08-26 DIAGNOSIS — N186 End stage renal disease: Secondary | ICD-10-CM | POA: Diagnosis not present

## 2015-08-26 DIAGNOSIS — D631 Anemia in chronic kidney disease: Secondary | ICD-10-CM | POA: Diagnosis not present

## 2015-08-26 DIAGNOSIS — N2581 Secondary hyperparathyroidism of renal origin: Secondary | ICD-10-CM | POA: Diagnosis not present

## 2015-08-28 DIAGNOSIS — D509 Iron deficiency anemia, unspecified: Secondary | ICD-10-CM | POA: Diagnosis not present

## 2015-08-28 DIAGNOSIS — R6883 Chills (without fever): Secondary | ICD-10-CM | POA: Diagnosis not present

## 2015-08-28 DIAGNOSIS — D631 Anemia in chronic kidney disease: Secondary | ICD-10-CM | POA: Diagnosis not present

## 2015-08-28 DIAGNOSIS — N2581 Secondary hyperparathyroidism of renal origin: Secondary | ICD-10-CM | POA: Diagnosis not present

## 2015-08-28 DIAGNOSIS — N186 End stage renal disease: Secondary | ICD-10-CM | POA: Diagnosis not present

## 2015-08-31 ENCOUNTER — Encounter (HOSPITAL_COMMUNITY): Payer: Self-pay | Admitting: *Deleted

## 2015-08-31 DIAGNOSIS — Z8701 Personal history of pneumonia (recurrent): Secondary | ICD-10-CM | POA: Diagnosis not present

## 2015-08-31 DIAGNOSIS — R51 Headache: Secondary | ICD-10-CM | POA: Diagnosis not present

## 2015-08-31 DIAGNOSIS — M549 Dorsalgia, unspecified: Secondary | ICD-10-CM | POA: Diagnosis not present

## 2015-08-31 DIAGNOSIS — R110 Nausea: Secondary | ICD-10-CM | POA: Diagnosis present

## 2015-08-31 DIAGNOSIS — Z992 Dependence on renal dialysis: Secondary | ICD-10-CM | POA: Diagnosis not present

## 2015-08-31 DIAGNOSIS — M1990 Unspecified osteoarthritis, unspecified site: Secondary | ICD-10-CM | POA: Diagnosis not present

## 2015-08-31 DIAGNOSIS — E785 Hyperlipidemia, unspecified: Secondary | ICD-10-CM | POA: Diagnosis not present

## 2015-08-31 DIAGNOSIS — M791 Myalgia: Secondary | ICD-10-CM | POA: Diagnosis not present

## 2015-08-31 DIAGNOSIS — Z87442 Personal history of urinary calculi: Secondary | ICD-10-CM | POA: Diagnosis not present

## 2015-08-31 DIAGNOSIS — K219 Gastro-esophageal reflux disease without esophagitis: Secondary | ICD-10-CM | POA: Insufficient documentation

## 2015-08-31 DIAGNOSIS — Z7982 Long term (current) use of aspirin: Secondary | ICD-10-CM | POA: Insufficient documentation

## 2015-08-31 DIAGNOSIS — I10 Essential (primary) hypertension: Secondary | ICD-10-CM | POA: Insufficient documentation

## 2015-08-31 DIAGNOSIS — R6883 Chills (without fever): Secondary | ICD-10-CM | POA: Diagnosis not present

## 2015-08-31 DIAGNOSIS — N2581 Secondary hyperparathyroidism of renal origin: Secondary | ICD-10-CM | POA: Diagnosis not present

## 2015-08-31 DIAGNOSIS — D631 Anemia in chronic kidney disease: Secondary | ICD-10-CM | POA: Diagnosis not present

## 2015-08-31 DIAGNOSIS — Z792 Long term (current) use of antibiotics: Secondary | ICD-10-CM | POA: Insufficient documentation

## 2015-08-31 DIAGNOSIS — N186 End stage renal disease: Secondary | ICD-10-CM | POA: Diagnosis not present

## 2015-08-31 DIAGNOSIS — E119 Type 2 diabetes mellitus without complications: Secondary | ICD-10-CM | POA: Diagnosis not present

## 2015-08-31 DIAGNOSIS — Z7952 Long term (current) use of systemic steroids: Secondary | ICD-10-CM | POA: Insufficient documentation

## 2015-08-31 DIAGNOSIS — Z87891 Personal history of nicotine dependence: Secondary | ICD-10-CM | POA: Insufficient documentation

## 2015-08-31 DIAGNOSIS — Z86018 Personal history of other benign neoplasm: Secondary | ICD-10-CM | POA: Insufficient documentation

## 2015-08-31 DIAGNOSIS — Z8669 Personal history of other diseases of the nervous system and sense organs: Secondary | ICD-10-CM | POA: Diagnosis not present

## 2015-08-31 DIAGNOSIS — Z794 Long term (current) use of insulin: Secondary | ICD-10-CM | POA: Insufficient documentation

## 2015-08-31 DIAGNOSIS — Z942 Lung transplant status: Secondary | ICD-10-CM | POA: Insufficient documentation

## 2015-08-31 DIAGNOSIS — Z79899 Other long term (current) drug therapy: Secondary | ICD-10-CM | POA: Insufficient documentation

## 2015-08-31 DIAGNOSIS — D509 Iron deficiency anemia, unspecified: Secondary | ICD-10-CM | POA: Diagnosis not present

## 2015-08-31 LAB — COMPREHENSIVE METABOLIC PANEL
ALK PHOS: 88 U/L (ref 38–126)
ALT: 14 U/L — AB (ref 17–63)
AST: 25 U/L (ref 15–41)
Albumin: 3 g/dL — ABNORMAL LOW (ref 3.5–5.0)
Anion gap: 15 (ref 5–15)
BUN: 18 mg/dL (ref 6–20)
CALCIUM: 8.8 mg/dL — AB (ref 8.9–10.3)
CO2: 25 mmol/L (ref 22–32)
CREATININE: 4.8 mg/dL — AB (ref 0.61–1.24)
Chloride: 95 mmol/L — ABNORMAL LOW (ref 101–111)
GFR, EST AFRICAN AMERICAN: 13 mL/min — AB (ref >60)
GFR, EST NON AFRICAN AMERICAN: 11 mL/min — AB (ref >60)
Glucose, Bld: 97 mg/dL (ref 65–99)
Potassium: 3.6 mmol/L (ref 3.5–5.1)
Sodium: 135 mmol/L (ref 135–145)
Total Bilirubin: 1 mg/dL (ref 0.3–1.2)
Total Protein: 6.6 g/dL (ref 6.5–8.1)

## 2015-08-31 LAB — CBC
HCT: 29.5 % — ABNORMAL LOW (ref 39.0–52.0)
Hemoglobin: 9.6 g/dL — ABNORMAL LOW (ref 13.0–17.0)
MCH: 32.5 pg (ref 26.0–34.0)
MCHC: 32.5 g/dL (ref 30.0–36.0)
MCV: 100 fL (ref 78.0–100.0)
PLATELETS: 517 10*3/uL — AB (ref 150–400)
RBC: 2.95 MIL/uL — AB (ref 4.22–5.81)
RDW: 15.6 % — AB (ref 11.5–15.5)
WBC: 6 10*3/uL (ref 4.0–10.5)

## 2015-08-31 LAB — LIPASE, BLOOD: Lipase: 26 U/L (ref 11–51)

## 2015-08-31 LAB — CBG MONITORING, ED: GLUCOSE-CAPILLARY: 94 mg/dL (ref 65–99)

## 2015-08-31 MED ORDER — ONDANSETRON 4 MG PO TBDP
4.0000 mg | ORAL_TABLET | Freq: Once | ORAL | Status: AC | PRN
  Administered 2015-08-31: 4 mg via ORAL

## 2015-08-31 MED ORDER — ONDANSETRON 4 MG PO TBDP
ORAL_TABLET | ORAL | Status: AC
  Filled 2015-08-31: qty 1

## 2015-08-31 NOTE — ED Notes (Signed)
Pt c/o chills that started around 1700 today during his dialysis treatment. Pt had full treatment done. No fever noted at dialysis or in ED. Pt reports some nausea, denies v/d.

## 2015-08-31 NOTE — ED Notes (Signed)
CBG 98  

## 2015-08-31 NOTE — ED Notes (Signed)
C/O being SOB, chaecked Sats level, Sats 96%. Placed patient on O2 @ 2LPM.

## 2015-09-01 ENCOUNTER — Emergency Department (HOSPITAL_COMMUNITY)
Admission: EM | Admit: 2015-09-01 | Discharge: 2015-09-01 | Disposition: A | Discharge status: Home / Self Care | Payer: Medicare Other | Attending: Physician Assistant | Admitting: Physician Assistant

## 2015-09-01 DIAGNOSIS — E0822 Diabetes mellitus due to underlying condition with diabetic chronic kidney disease: Secondary | ICD-10-CM | POA: Diagnosis not present

## 2015-09-01 DIAGNOSIS — J849 Interstitial pulmonary disease, unspecified: Secondary | ICD-10-CM | POA: Diagnosis not present

## 2015-09-01 DIAGNOSIS — M791 Myalgia, unspecified site: Secondary | ICD-10-CM

## 2015-09-01 DIAGNOSIS — J811 Chronic pulmonary edema: Secondary | ICD-10-CM | POA: Diagnosis not present

## 2015-09-01 DIAGNOSIS — Z942 Lung transplant status: Secondary | ICD-10-CM | POA: Diagnosis not present

## 2015-09-01 DIAGNOSIS — N186 End stage renal disease: Secondary | ICD-10-CM | POA: Diagnosis not present

## 2015-09-01 DIAGNOSIS — Z794 Long term (current) use of insulin: Secondary | ICD-10-CM | POA: Diagnosis not present

## 2015-09-01 DIAGNOSIS — J9811 Atelectasis: Secondary | ICD-10-CM | POA: Diagnosis not present

## 2015-09-01 DIAGNOSIS — D631 Anemia in chronic kidney disease: Secondary | ICD-10-CM | POA: Diagnosis not present

## 2015-09-01 DIAGNOSIS — M549 Dorsalgia, unspecified: Secondary | ICD-10-CM | POA: Diagnosis not present

## 2015-09-01 DIAGNOSIS — R918 Other nonspecific abnormal finding of lung field: Secondary | ICD-10-CM | POA: Diagnosis not present

## 2015-09-01 DIAGNOSIS — J90 Pleural effusion, not elsewhere classified: Secondary | ICD-10-CM | POA: Diagnosis not present

## 2015-09-01 LAB — CBG MONITORING, ED: GLUCOSE-CAPILLARY: 100 mg/dL — AB (ref 65–99)

## 2015-09-01 MED ORDER — ACETAMINOPHEN 325 MG PO TABS
650.0000 mg | ORAL_TABLET | Freq: Once | ORAL | Status: AC
  Administered 2015-09-01: 650 mg via ORAL
  Filled 2015-09-01: qty 2

## 2015-09-01 NOTE — ED Notes (Signed)
CBG 100, Rolene Arbour Aware.

## 2015-09-01 NOTE — ED Notes (Signed)
MD at bedside. 

## 2015-09-01 NOTE — Discharge Instructions (Signed)
Please follow-up if you have a fever, cough, shortness of breath, chest pain, nausea vomiting or diarrhea. Musculoskeletal Pain Musculoskeletal pain is muscle and boney aches and pains. These pains can occur in any part of the body. Your caregiver may treat you without knowing the cause of the pain. They may treat you if blood or urine tests, X-rays, and other tests were normal.  CAUSES There is often not a definite cause or reason for these pains. These pains may be caused by a type of germ (virus). The discomfort may also come from overuse. Overuse includes working out too hard when your body is not fit. Boney aches also come from weather changes. Bone is sensitive to atmospheric pressure changes. HOME CARE INSTRUCTIONS   Ask when your test results will be ready. Make sure you get your test results.  Only take over-the-counter or prescription medicines for pain, discomfort, or fever as directed by your caregiver. If you were given medications for your condition, do not drive, operate machinery or power tools, or sign legal documents for 24 hours. Do not drink alcohol. Do not take sleeping pills or other medications that may interfere with treatment.  Continue all activities unless the activities cause more pain. When the pain lessens, slowly resume normal activities. Gradually increase the intensity and duration of the activities or exercise.  During periods of severe pain, bed rest may be helpful. Lay or sit in any position that is comfortable.  Putting ice on the injured area.  Put ice in a bag.  Place a towel between your skin and the bag.  Leave the ice on for 15 to 20 minutes, 3 to 4 times a day.  Follow up with your caregiver for continued problems and no reason can be found for the pain. If the pain becomes worse or does not go away, it may be necessary to repeat tests or do additional testing. Your caregiver may need to look further for a possible cause. SEEK IMMEDIATE MEDICAL CARE  IF:  You have pain that is getting worse and is not relieved by medications.  You develop chest pain that is associated with shortness or breath, sweating, feeling sick to your stomach (nauseous), or throw up (vomit).  Your pain becomes localized to the abdomen.  You develop any new symptoms that seem different or that concern you. MAKE SURE YOU:   Understand these instructions.  Will watch your condition.  Will get help right away if you are not doing well or get worse.   This information is not intended to replace advice given to you by your health care provider. Make sure you discuss any questions you have with your health care provider.   Document Released: 05/23/2005 Document Revised: 08/15/2011 Document Reviewed: 01/25/2013 Elsevier Interactive Patient Education [employer].

## 2015-09-01 NOTE — ED Provider Notes (Signed)
CSN: [id number]     Arrival date & time 08/31/15  2005 History  By signing my name below, I, Helane Gunther, attest that this documentation has been prepared under the direction and in the presence of Lexxus Underhill Julio Alm, MD. Electronically Signed: Helane Gunther, ED Scribe. 09/01/2015. 2:28 AM.    Chief Complaint  Patient presents with  . Chills  . Nausea   The history is provided by the patient and the spouse. No language interpreter was used.   HPI Comments: [name] is a 65 y.o. male former smoker with a PMHx of renal disorder, DM, HTN, and aortic valve disorders who presents to the Emergency Department complaining of chills and nausea onset immediatly after having dialysis this afternoon. He reports associated headache and generalized myalgias. He reports some SOB earlier today, which has since resolved. 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 Per wife, pt has an appointment at [facility] this morning. Pt denies cough and vomiting.   He also c/o back pain, for which his PCP has given him a heating pad. Per wife, pt is only able to take tylenol.   Past Medical History  Diagnosis Date  . Benign neoplasm of colon   . Aortic valve disorders   . Osteoarthrosis, unspecified whether generalized or localized, unspecified site   . Degeneration of intervertebral disc, site unspecified   . Other and unspecified hyperlipidemia   . Unspecified essential hypertension   . Obstructive sleep apnea (adult) (pediatric)   . Esophageal reflux   . 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 Pneumonia     interstitial pneumonia  . Diabetes mellitus without complication ([place])   . Renal disorder   . Transplanted, lung [facility])    Past Surgical History  Procedure Laterality Date  . Lung biopsy  2010  . Lung transplant, single Right    Family History  Problem Relation Age of Onset  . Pancreatic cancer Brother   . Heart disease Father     Social History  Substance Use Topics  . Smoking status: Former Smoker -- 0.30 packs/day for 20 years    Types: Cigarettes    Quit date: 06/06/2002  . Smokeless tobacco: Never Used  . Alcohol Use: No    Review of Systems  Constitutional: Positive for chills. Negative for fever.  Respiratory: Negative for cough.   Gastrointestinal: Positive for nausea. Negative for vomiting.  Musculoskeletal: Positive for myalgias and back pain.  Neurological: Positive for headaches.  All other systems reviewed and are negative.   Allergies  Levofloxacin and Nsaids  Home Medications   Prior to Admission medications   Medication Sig Start Date End Date Taking? Authorizing Provider  acetaminophen (TYLENOL) 500 MG tablet Take 500 mg by mouth every 6 (six) hours as needed for fever or headache.   Yes Historical Provider, MD  aspirin 81 MG tablet Take 81 mg by mouth daily.    Yes Historical Provider, MD  azaTHIOprine (IMURAN) 50 MG tablet take 3 tablets by mouth once daily 11/26/13  Yes Kathee Delton, MD  cycloSPORINE modified (NEORAL) 100 MG capsule Take 1 capsule by mouth at bedtime.  03/09/15 04/08/16 Yes Historical Provider, MD  cycloSPORINE modified (NEORAL) 25 MG capsule Take 75 mg by mouth every morning.   Yes Historical Provider, MD  insulin aspart (NOVOLOG) 100 UNIT/ML injection Inject 2-6 Units into the skin every 4 (four) hours. 04/18/15  Yes Erick Colace, NP  Multiple  Vitamin (MULTIVITAMIN WITH MINERALS) TABS tablet Take 1 tablet by mouth daily.   Yes Historical Provider, MD  omeprazole (PRILOSEC) 20 MG capsule Take 20 mg by mouth daily.   Yes Historical Provider, MD  pravastatin (PRAVACHOL) 20 MG tablet Take 20 mg by mouth daily.   Yes Historical Provider, MD  predniSONE (DELTASONE) 5 MG tablet Take 3.5 tablets daily as directed Patient taking differently: Take 5 mg by mouth daily with breakfast.  12/02/13  Yes Kathee Delton, MD  sertraline (ZOLOFT) 50 MG tablet Take 50 mg by mouth  daily.   Yes Historical Provider, MD  sulfamethoxazole-trimethoprim (BACTRIM DS,SEPTRA DS) 800-160 MG per tablet Take 1 tablet by mouth daily. Patient taking differently: Take 1 tablet by mouth daily. Take one tablet by mouth on mondays, wednesdays and fridays 05/26/14  Yes Kathee Delton, MD  valGANciclovir (VALCYTE) 450 MG tablet Take 1 tablet by mouth every Monday. Patient takes one tablet every Monday and Friday after dialysis 03/26/15  Yes Historical Provider, MD  acetaminophen (TYLENOL) 160 MG/5ML solution Place 20.3 mLs (650 mg total) into feeding tube every 6 (six) hours as needed for mild pain, headache or fever. Patient not taking: Reported on 09/01/2015 04/19/15   Erick Colace, NP  anidulafungin 100 mg in sodium chloride 0.9 % 100 mL Inject 100 mg into the vein daily. Patient not taking: Reported on 08/21/2015 04/19/15   Erick Colace, NP  azithromycin 500 mg in dextrose 5 % 250 mL Inject 500 mg into the vein daily. Patient not taking: Reported on 08/21/2015 04/18/15   Erick Colace, NP  chlorhexidine gluconate (PERIDEX) 0.12 % solution 15 mLs by Mouth Rinse route 2 (two) times daily. Patient not taking: Reported on 09/01/2015 04/19/15   Erick Colace, NP  fentaNYL 2,500 mcg in sodium chloride 0.9 % 200 mL Titrate as needed Patient not taking: Reported on 09/01/2015 04/18/15   Erick Colace, NP  fluticasone [facility]) 50 MCG/ACT nasal spray Place 2 sprays into the nose daily. Patient not taking: Reported on 09/01/2015 03/05/13   Wenda Low, MD  heparin 5000 UNIT/ML injection Inject 1 mL (5,000 Units total) into the skin every 8 (eight) hours. Patient not taking: Reported on 09/01/2015 04/18/15   Erick Colace, NP  midazolam (VERSED) 2 MG/2ML SOLN injection Inject 1-2 mLs (1-2 mg total) into the vein every 2 (two) hours as needed for agitation. Patient not taking: Reported on 08/21/2015 04/18/15   Erick Colace, NP  norepinephrine 16 mg in dextrose 5 % 250 mL Titrate for MAP >  65 Patient not taking: Reported on 08/21/2015 04/19/15   Erick Colace, NP  norepinephrine 4 mg in dextrose 5 % 250 mL Inject 2-50 mcg/min into the vein continuous. Patient not taking: Reported on 08/21/2015 04/18/15   Erick Colace, NP  pantoprazole (PROTONIX) 40 MG injection Inject 40 mg into the vein daily. Patient not taking: Reported on 08/21/2015 04/18/15   Erick Colace, NP  piperacillin-tazobactam (ZOSYN) 2-0.25 GM/50ML IVPB Inject 50 mLs (2.25 g total) into the vein every 12 (twelve) hours. Patient not taking: Reported on 08/21/2015 04/18/15   Erick Colace, NP  piperacillin-tazobactam (ZOSYN) 3-0.375 GM/50ML IVPB Inject 50 mLs (3.375 g total) into the vein every 6 (six) hours. Patient not taking: Reported on 08/21/2015 04/19/15   Erick Colace, NP  sodium chloride 0.9 % infusion kvo ivfs Patient not taking: Reported on 08/21/2015 04/19/15   Erick Colace, NP  sodium chloride 0.9 %  Inject 1,000 mLs into the vein every hour. Patient not taking: Reported on 08/21/2015 04/18/15   Erick Colace, NP  Vancomycin (VANCOCIN) 750 MG/150ML SOLN Inject 150 mLs (750 mg total) into the vein daily. Patient not taking: Reported on 08/21/2015 04/20/15   Erick Colace, NP  vasopressin 40 Units in sodium chloride 0.9 % 250 mL Inject 0.03 Units/min into the vein continuous. Patient not taking: Reported on 08/21/2015 04/18/15   Erick Colace, NP   BP 116/78 mmHg  Pulse 107  Temp(Src) 99.5 F (37.5 C) (Oral)  Resp 12  SpO2 100% Physical Exam  Constitutional: He is oriented to person, place, and time. He appears well-developed and well-nourished.  HENT:  Head: Normocephalic and atraumatic.  Eyes: Conjunctivae are normal. Right eye exhibits no discharge. Left eye exhibits no discharge.  Pulmonary/Chest: Effort normal and breath sounds normal. No respiratory distress. He has no wheezes.  Abdominal: Soft. Bowel sounds are normal. There is no tenderness.  Neurological: He is alert and  oriented to person, place, and time. Coordination normal.  Skin: Skin is warm and dry. No rash noted. He is not diaphoretic. No erythema.  Psychiatric: He has a normal mood and affect.  Nursing note and vitals reviewed.   ED Course  Procedures  DIAGNOSTIC STUDIES: Oxygen Saturation is 100% on RA, normal by my interpretation.    COORDINATION OF CARE: 2:27 AM - Discussed probable viral infection and plans to order a dose of tylenol. Advised pt to return to the ED if he develops a fever. Pt advised of plan for treatment and pt agrees.  Labs Review Labs Reviewed  COMPREHENSIVE METABOLIC PANEL - Abnormal; Notable for the following:    Chloride 95 (*)    Creatinine, Ser 4.80 (*)    Calcium 8.8 (*)    Albumin 3.0 (*)    ALT 14 (*)    GFR calc non Af Amer 11 (*)    GFR calc Af Amer 13 (*)    All other components within normal limits  CBC - Abnormal; Notable for the following:    RBC 2.95 (*)    Hemoglobin 9.6 (*)    HCT 29.5 (*)    RDW 15.6 (*)    Platelets 517 (*)    All other components within normal limits  CBG MONITORING, ED - Abnormal; Notable for the following:    Glucose-Capillary 100 (*)    All other components within normal limits  LIPASE, BLOOD  CBG MONITORING, ED    Imaging Review No results found. I have personally reviewed and evaluated these images and lab results as part of my medical decision-making.   EKG Interpretation None      MDM   Final diagnoses:  None    Patient is a pleasant 65 year old male presenting with chills and nausea that has since resolved. Patient felt chills and a little bit of nausea during dialysis this evening. He and wife report that he's been feeling better since he's been waiting to be seen by the physician. Patient denies any cough or shortness of breath currently. We offered chest x-ray but patient does not want at this time. He was 100% on room air. Patient asking for Tylenol and heat pack for his back pain. Patient like to  follow up with his [place] tomorrow morning at 9 AM. Currently given his normal labs and physical exam I would not pursue any advanced imaging or other testing at this time.  We discussed this could be early  signs of flu however patient has no symptoms right now so we will have him follow-up if the symptoms return or if he develops a fever.   I personally performed the services described in this documentation, which was scribed in my presence. The recorded information has been reviewed and is accurate.     Gargi Berch Julio Alm, MD 09/01/15 [phone]

## 2015-09-02 ENCOUNTER — Emergency Department (HOSPITAL_COMMUNITY): Payer: Medicare Other

## 2015-09-02 ENCOUNTER — Emergency Department (HOSPITAL_COMMUNITY)
Admission: EM | Admit: 2015-09-02 | Discharge: 2015-09-03 | Disposition: A | Discharge status: Other Acute Inpatient Hospital | Payer: Medicare Other | Attending: Emergency Medicine | Admitting: Emergency Medicine

## 2015-09-02 ENCOUNTER — Encounter (HOSPITAL_COMMUNITY): Payer: Self-pay | Admitting: Emergency Medicine

## 2015-09-02 DIAGNOSIS — M1990 Unspecified osteoarthritis, unspecified site: Secondary | ICD-10-CM | POA: Insufficient documentation

## 2015-09-02 DIAGNOSIS — E785 Hyperlipidemia, unspecified: Secondary | ICD-10-CM | POA: Insufficient documentation

## 2015-09-02 DIAGNOSIS — Z794 Long term (current) use of insulin: Secondary | ICD-10-CM | POA: Insufficient documentation

## 2015-09-02 DIAGNOSIS — I120 Hypertensive chronic kidney disease with stage 5 chronic kidney disease or end stage renal disease: Secondary | ICD-10-CM | POA: Diagnosis not present

## 2015-09-02 DIAGNOSIS — I959 Hypotension, unspecified: Secondary | ICD-10-CM | POA: Diagnosis not present

## 2015-09-02 DIAGNOSIS — Z7982 Long term (current) use of aspirin: Secondary | ICD-10-CM | POA: Diagnosis not present

## 2015-09-02 DIAGNOSIS — Z87891 Personal history of nicotine dependence: Secondary | ICD-10-CM | POA: Insufficient documentation

## 2015-09-02 DIAGNOSIS — R51 Headache: Secondary | ICD-10-CM | POA: Diagnosis not present

## 2015-09-02 DIAGNOSIS — A419 Sepsis, unspecified organism: Secondary | ICD-10-CM | POA: Insufficient documentation

## 2015-09-02 DIAGNOSIS — R0682 Tachypnea, not elsewhere classified: Secondary | ICD-10-CM | POA: Insufficient documentation

## 2015-09-02 DIAGNOSIS — Z992 Dependence on renal dialysis: Secondary | ICD-10-CM | POA: Diagnosis not present

## 2015-09-02 DIAGNOSIS — Z79899 Other long term (current) drug therapy: Secondary | ICD-10-CM | POA: Diagnosis not present

## 2015-09-02 DIAGNOSIS — E119 Type 2 diabetes mellitus without complications: Secondary | ICD-10-CM | POA: Insufficient documentation

## 2015-09-02 DIAGNOSIS — Z85038 Personal history of other malignant neoplasm of large intestine: Secondary | ICD-10-CM | POA: Insufficient documentation

## 2015-09-02 DIAGNOSIS — M545 Low back pain: Secondary | ICD-10-CM | POA: Insufficient documentation

## 2015-09-02 DIAGNOSIS — R509 Fever, unspecified: Secondary | ICD-10-CM | POA: Diagnosis not present

## 2015-09-02 DIAGNOSIS — N186 End stage renal disease: Secondary | ICD-10-CM | POA: Diagnosis not present

## 2015-09-02 DIAGNOSIS — K219 Gastro-esophageal reflux disease without esophagitis: Secondary | ICD-10-CM | POA: Insufficient documentation

## 2015-09-02 DIAGNOSIS — R0989 Other specified symptoms and signs involving the circulatory and respiratory systems: Secondary | ICD-10-CM | POA: Insufficient documentation

## 2015-09-02 DIAGNOSIS — N2581 Secondary hyperparathyroidism of renal origin: Secondary | ICD-10-CM | POA: Diagnosis not present

## 2015-09-02 DIAGNOSIS — D631 Anemia in chronic kidney disease: Secondary | ICD-10-CM | POA: Diagnosis not present

## 2015-09-02 DIAGNOSIS — Z8701 Personal history of pneumonia (recurrent): Secondary | ICD-10-CM | POA: Diagnosis not present

## 2015-09-02 DIAGNOSIS — D509 Iron deficiency anemia, unspecified: Secondary | ICD-10-CM | POA: Diagnosis not present

## 2015-09-02 DIAGNOSIS — R6883 Chills (without fever): Secondary | ICD-10-CM | POA: Diagnosis not present

## 2015-09-02 DIAGNOSIS — K5730 Diverticulosis of large intestine without perforation or abscess without bleeding: Secondary | ICD-10-CM | POA: Diagnosis not present

## 2015-09-02 DIAGNOSIS — J90 Pleural effusion, not elsewhere classified: Secondary | ICD-10-CM | POA: Diagnosis not present

## 2015-09-02 DIAGNOSIS — R109 Unspecified abdominal pain: Secondary | ICD-10-CM | POA: Insufficient documentation

## 2015-09-02 LAB — COMPREHENSIVE METABOLIC PANEL
ALK PHOS: 93 U/L (ref 38–126)
ALT: 16 U/L — AB (ref 17–63)
ANION GAP: 14 (ref 5–15)
AST: 38 U/L (ref 15–41)
Albumin: 2.6 g/dL — ABNORMAL LOW (ref 3.5–5.0)
BILIRUBIN TOTAL: 1 mg/dL (ref 0.3–1.2)
BUN: 13 mg/dL (ref 6–20)
CALCIUM: 8.2 mg/dL — AB (ref 8.9–10.3)
CO2: 24 mmol/L (ref 22–32)
CREATININE: 3.49 mg/dL — AB (ref 0.61–1.24)
Chloride: 99 mmol/L — ABNORMAL LOW (ref 101–111)
GFR, EST AFRICAN AMERICAN: 20 mL/min — AB (ref >60)
GFR, EST NON AFRICAN AMERICAN: 17 mL/min — AB (ref >60)
Glucose, Bld: 120 mg/dL — ABNORMAL HIGH (ref 65–99)
Potassium: 3.1 mmol/L — ABNORMAL LOW (ref 3.5–5.1)
SODIUM: 137 mmol/L (ref 135–145)
TOTAL PROTEIN: 5.8 g/dL — AB (ref 6.5–8.1)

## 2015-09-02 LAB — CBC WITH DIFFERENTIAL/PLATELET
BASOS ABS: 0 10*3/uL (ref 0.0–0.1)
BASOS PCT: 0 %
EOS ABS: 0 10*3/uL (ref 0.0–0.7)
Eosinophils Relative: 0 %
HCT: 29.6 % — ABNORMAL LOW (ref 39.0–52.0)
Hemoglobin: 9.6 g/dL — ABNORMAL LOW (ref 13.0–17.0)
LYMPHS PCT: 4 %
Lymphs Abs: 0.2 10*3/uL — ABNORMAL LOW (ref 0.7–4.0)
MCH: 32.3 pg (ref 26.0–34.0)
MCHC: 32.4 g/dL (ref 30.0–36.0)
MCV: 99.7 fL (ref 78.0–100.0)
MONO ABS: 0.8 10*3/uL (ref 0.1–1.0)
Monocytes Relative: 16 %
NEUTROS PCT: 80 %
Neutro Abs: 3.8 10*3/uL (ref 1.7–7.7)
PLATELETS: 388 10*3/uL (ref 150–400)
RBC: 2.97 MIL/uL — AB (ref 4.22–5.81)
RDW: 16 % — AB (ref 11.5–15.5)
WBC MORPHOLOGY: INCREASED
WBC: 4.8 10*3/uL (ref 4.0–10.5)

## 2015-09-02 LAB — LIPASE, BLOOD: Lipase: 22 U/L (ref 11–51)

## 2015-09-02 LAB — I-STAT CG4 LACTIC ACID, ED: Lactic Acid, Venous: 1.87 mmol/L (ref 0.5–2.0)

## 2015-09-02 LAB — CBG MONITORING, ED: GLUCOSE-CAPILLARY: 108 mg/dL — AB (ref 65–99)

## 2015-09-02 MED ORDER — SODIUM CHLORIDE 0.9 % IV BOLUS (SEPSIS)
500.0000 mL | Freq: Once | INTRAVENOUS | Status: AC
  Administered 2015-09-02: 500 mL via INTRAVENOUS

## 2015-09-02 MED ORDER — SODIUM CHLORIDE 0.9 % IV BOLUS (SEPSIS)
1000.0000 mL | INTRAVENOUS | Status: AC
  Administered 2015-09-02 (×2): 1000 mL via INTRAVENOUS

## 2015-09-02 MED ORDER — ACETAMINOPHEN 500 MG PO TABS
1000.0000 mg | ORAL_TABLET | Freq: Once | ORAL | Status: AC
  Administered 2015-09-02: 1000 mg via ORAL
  Filled 2015-09-02: qty 2

## 2015-09-02 MED ORDER — DEXTROSE 5 % IV SOLN
500.0000 mg | Freq: Once | INTRAVENOUS | Status: AC
  Administered 2015-09-02: 500 mg via INTRAVENOUS
  Filled 2015-09-02: qty 500

## 2015-09-02 MED ORDER — PIPERACILLIN-TAZOBACTAM 3.375 G IVPB 30 MIN
3.3750 g | Freq: Once | INTRAVENOUS | Status: AC
  Administered 2015-09-02: 3.375 g via INTRAVENOUS
  Filled 2015-09-02: qty 50

## 2015-09-02 NOTE — ED Notes (Signed)
PO challenge started

## 2015-09-02 NOTE — ED Notes (Signed)
Per pt he doesn't make any urine, pt is a HD M, W, F.

## 2015-09-02 NOTE — Progress Notes (Addendum)
Pharmacy Antibiotic Note  [name] is a 65 y.o. male w/ ESRD admitted on 09/02/2015 with sepsis.  Pharmacy has been consulted for Vancomycin dosing. Pt also receiving azithromycin and zosyn. Last HD session 3/29. Patient states he has not been receiving any antibiotics during outpatient HD.   Plan:  Vancomycin '1500mg'$  IV x1 in the ED F/U HD plans and future vanc dosing  F/U c/s, LOT, HD plans, vanc levels prn   Height: 5' 3.5" (161.3 cm) Weight: 144 lb (65.318 kg) IBW/kg (Calculated) : 58.05  Temp (24hrs), Avg:99.8 F (37.7 C), Min:99.8 F (37.7 C), Max:99.8 F (37.7 C)   Recent Labs Lab 08/31/15 2147  WBC 6.0  CREATININE 4.80*    Estimated Creatinine Clearance: 12.4 mL/min (by C-G formula based on Cr of 4.8).    Allergies  Allergen Reactions  . Levofloxacin     LOSS OF CONSCIOUSNESS  . Nsaids     Patient instructed not to take NSAID's after his lung transplant    Antimicrobials this admission: 3/29 Vanc>> 3/29 Zosyn>> 3/29 Azithromycin>>  Thank you for allowing pharmacy to be a part of this patient's care.  Conrad Whitewater 09/02/2015 8:20 PM  Pharmacy Code Sepsis Protocol  Time of code sepsis page: 1954 '[x]'$  Antibiotics delivered at 2020    Were antibiotics ordered at the time of the code sepsis page? Yes Was it required to contact the physician? '[x]'$  Physician not contacted '[]'$  Physician contacted to order antibiotics for code sepsis '[]'$  Physician contacted to recommend changing antibiotics  Pharmacy consulted for: Vancomycin   Anti-infectives    Start     Dose/Rate Route Frequency Ordered Stop   09/02/15 2030  piperacillin-tazobactam (ZOSYN) IVPB 3.375 g     3.375 g 100 mL/hr over 30 Minutes Intravenous  Once 09/02/15 2015     09/02/15 2030  azithromycin (ZITHROMAX) 500 mg in dextrose 5 % 250 mL IVPB     500 mg 250 mL/hr over 60 Minutes Intravenous  Once 09/02/15 2015          Nurse education provided: '[x]'$  Minutes left to administer  antibiotics to achieve 1 hour goal '[x]'$  Correct order of antibiotic administration '[x]'$  Antibiotic Y-site compatibilities     Alyaan Budzynski C. Lennox Grumbles, PharmD Pharmacy Resident  Pager: [phone] 09/02/2015 8:39 PM

## 2015-09-02 NOTE — ED Notes (Signed)
Patient transported to CT 

## 2015-09-02 NOTE — ED Provider Notes (Signed)
CSN: [id number]     Arrival date & time 09/02/15  1927 History   First MD Initiated Contact with Patient 09/02/15 1934     Chief Complaint  Patient presents with  . Hypotension     (Consider location/radiation/quality/duration/timing/severity/associated sxs/prior Treatment) HPI  65 year old male with a history of ESRD on dialysis and lung transplant presents with fever and hypotension from dialysis. EMS reports a temp of 101, patient states his temperature was 100. Blood pressure dropped to 60 systolic. Patient states typically his blood pressure does not drop in dialysis. Started feeling bad 2 days ago. Has had chills, myalgias, nausea without vomiting, headache, and low back pain. Denies chest pain, cough, or shortness of breath. Saw his lung transplant team yesterday and had a CT scan performed. 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 Has not started on antifungals due to insurance issues. [facility] transplant team is planning on doing a bronch tomorrow.  Past Medical History  Diagnosis Date  . Benign neoplasm of colon   . Aortic valve disorders   . Osteoarthrosis, unspecified whether generalized or localized, unspecified site   . Degeneration of intervertebral disc, site unspecified   . Other and unspecified hyperlipidemia   . Unspecified essential hypertension   . Obstructive sleep apnea (adult) (pediatric)   . Esophageal reflux   . 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 Pneumonia     interstitial pneumonia  . Diabetes mellitus without complication ([place])   . Renal disorder   . Transplanted, lung [facility])    Past Surgical History  Procedure Laterality Date  . Lung biopsy  2010  . Lung transplant, single Right    Family History  Problem Relation Age of Onset  . Pancreatic cancer Brother   . Heart disease Father    Social History  Substance Use Topics  . Smoking status: Former Smoker -- 0.30 packs/day for 20 years     Types: Cigarettes    Quit date: 06/06/2002  . Smokeless tobacco: Never Used  . Alcohol Use: No    Review of Systems  Constitutional: Positive for fever and chills.  Respiratory: Negative for cough.   Cardiovascular: Negative for chest pain.  Gastrointestinal: Positive for abdominal pain. Negative for vomiting.  Genitourinary:       Does not urinate  Musculoskeletal: Positive for myalgias and back pain.  Neurological: Positive for headaches.  All other systems reviewed and are negative.     Allergies  Levofloxacin and Nsaids  Home Medications   Prior to Admission medications   Medication Sig Start Date End Date Taking? Authorizing Provider  acetaminophen (TYLENOL) 160 MG/5ML solution Place 20.3 mLs (650 mg total) into feeding tube every 6 (six) hours as needed for mild pain, headache or fever. Patient not taking: Reported on 09/01/2015 04/19/15   Erick Colace, NP  acetaminophen (TYLENOL) 500 MG tablet Take 500 mg by mouth every 6 (six) hours as needed for fever or headache.    Historical Provider, MD  anidulafungin 100 mg in sodium chloride 0.9 % 100 mL Inject 100 mg into the vein daily. Patient not taking: Reported on 08/21/2015 04/19/15   Erick Colace, NP  aspirin 81 MG tablet Take 81 mg by mouth daily.     Historical Provider, MD  azaTHIOprine (IMURAN) 50 MG tablet take 3 tablets by mouth once daily 11/26/13   Kathee Delton, MD  azithromycin 500 mg in dextrose 5 % 250 mL  Inject 500 mg into the vein daily. Patient not taking: Reported on 08/21/2015 04/18/15   Erick Colace, NP  chlorhexidine gluconate (PERIDEX) 0.12 % solution 15 mLs by Mouth Rinse route 2 (two) times daily. Patient not taking: Reported on 09/01/2015 04/19/15   Erick Colace, NP  cycloSPORINE modified (NEORAL) 100 MG capsule Take 1 capsule by mouth at bedtime.  03/09/15 04/08/16  Historical Provider, MD  cycloSPORINE modified (NEORAL) 25 MG capsule Take 75 mg by mouth every morning.    Historical  Provider, MD  fentaNYL 2,500 mcg in sodium chloride 0.9 % 200 mL Titrate as needed Patient not taking: Reported on 09/01/2015 04/18/15   Erick Colace, NP  fluticasone [facility]) 50 MCG/ACT nasal spray Place 2 sprays into the nose daily. Patient not taking: Reported on 09/01/2015 03/05/13   Wenda Low, MD  heparin 5000 UNIT/ML injection Inject 1 mL (5,000 Units total) into the skin every 8 (eight) hours. Patient not taking: Reported on 09/01/2015 04/18/15   Erick Colace, NP  insulin aspart (NOVOLOG) 100 UNIT/ML injection Inject 2-6 Units into the skin every 4 (four) hours. 04/18/15   Erick Colace, NP  midazolam (VERSED) 2 MG/2ML SOLN injection Inject 1-2 mLs (1-2 mg total) into the vein every 2 (two) hours as needed for agitation. Patient not taking: Reported on 08/21/2015 04/18/15   Erick Colace, NP  Multiple Vitamin (MULTIVITAMIN WITH MINERALS) TABS tablet Take 1 tablet by mouth daily.    Historical Provider, MD  norepinephrine 16 mg in dextrose 5 % 250 mL Titrate for MAP > 65 Patient not taking: Reported on 08/21/2015 04/19/15   Erick Colace, NP  norepinephrine 4 mg in dextrose 5 % 250 mL Inject 2-50 mcg/min into the vein continuous. Patient not taking: Reported on 08/21/2015 04/18/15   Erick Colace, NP  omeprazole (PRILOSEC) 20 MG capsule Take 20 mg by mouth daily.    Historical Provider, MD  pantoprazole (PROTONIX) 40 MG injection Inject 40 mg into the vein daily. Patient not taking: Reported on 08/21/2015 04/18/15   Erick Colace, NP  piperacillin-tazobactam (ZOSYN) 2-0.25 GM/50ML IVPB Inject 50 mLs (2.25 g total) into the vein every 12 (twelve) hours. Patient not taking: Reported on 08/21/2015 04/18/15   Erick Colace, NP  piperacillin-tazobactam (ZOSYN) 3-0.375 GM/50ML IVPB Inject 50 mLs (3.375 g total) into the vein every 6 (six) hours. Patient not taking: Reported on 08/21/2015 04/19/15   Erick Colace, NP  pravastatin (PRAVACHOL) 20 MG tablet Take 20 mg by mouth daily.     Historical Provider, MD  predniSONE (DELTASONE) 5 MG tablet Take 3.5 tablets daily as directed Patient taking differently: Take 5 mg by mouth daily with breakfast.  12/02/13   Kathee Delton, MD  sertraline (ZOLOFT) 50 MG tablet Take 50 mg by mouth daily.    Historical Provider, MD  sodium chloride 0.9 % infusion kvo ivfs Patient not taking: Reported on 08/21/2015 04/19/15   Erick Colace, NP  sodium chloride 0.9 % Inject 1,000 mLs into the vein every hour. Patient not taking: Reported on 08/21/2015 04/18/15   Erick Colace, NP  sulfamethoxazole-trimethoprim (BACTRIM DS,SEPTRA DS) 800-160 MG per tablet Take 1 tablet by mouth daily. Patient taking differently: Take 1 tablet by mouth daily. Take one tablet by mouth on mondays, wednesdays and fridays 05/26/14   Kathee Delton, MD  valGANciclovir (VALCYTE) 450 MG tablet Take 1 tablet by mouth every Monday. Patient takes one tablet every Monday and Friday  after dialysis 03/26/15   Historical Provider, MD  Vancomycin (VANCOCIN) 750 MG/150ML SOLN Inject 150 mLs (750 mg total) into the vein daily. Patient not taking: Reported on 08/21/2015 04/20/15   Erick Colace, NP  vasopressin 40 Units in sodium chloride 0.9 % 250 mL Inject 0.03 Units/min into the vein continuous. Patient not taking: Reported on 08/21/2015 04/18/15   Erick Colace, NP   BP 83/40 mmHg  Pulse 75  Temp(Src) 99.8 F (37.7 C) (Oral)  Resp 18  Ht 5' 3.5" (1.613 m)  Wt 144 lb (65.318 kg)  BMI 25.11 kg/m2  SpO2 96% Physical Exam  Constitutional: He is oriented to person, place, and time. He appears well-developed and well-nourished.  HENT:  Head: Normocephalic and atraumatic.  Right Ear: External ear normal.  Left Ear: External ear normal.  Nose: Nose normal.  Eyes: Right eye exhibits no discharge. Left eye exhibits no discharge.  Neck: Neck supple.  Cardiovascular: Normal rate, regular rhythm, normal heart sounds and intact distal pulses.   Pulmonary/Chest: Effort  normal. Tachypnea noted. He has rales (left sided).  Abdominal: Soft. There is tenderness (lower).  Musculoskeletal: He exhibits no edema.  Neurological: He is alert and oriented to person, place, and time.  Skin: Skin is warm and dry. He is not diaphoretic.  Nursing note and vitals reviewed.   ED Course  Procedures (including critical care time) Labs Review Labs Reviewed  COMPREHENSIVE METABOLIC PANEL - Abnormal; Notable for the following:    Potassium 3.1 (*)    Chloride 99 (*)    Glucose, Bld 120 (*)    Creatinine, Ser 3.49 (*)    Calcium 8.2 (*)    Total Protein 5.8 (*)    Albumin 2.6 (*)    ALT 16 (*)    GFR calc non Af Amer 17 (*)    GFR calc Af Amer 20 (*)    All other components within normal limits  CBC WITH DIFFERENTIAL/PLATELET - Abnormal; Notable for the following:    RBC 2.97 (*)    Hemoglobin 9.6 (*)    HCT 29.6 (*)    RDW 16.0 (*)    Lymphs Abs 0.2 (*)    All other components within normal limits  CULTURE, BLOOD (ROUTINE X 2)  CULTURE, BLOOD (ROUTINE X 2)  LIPASE, BLOOD  INFLUENZA PANEL BY PCR (TYPE A & B, H1N1)  I-STAT CG4 LACTIC ACID, ED  I-STAT CG4 LACTIC ACID, ED    Imaging Review Ct Abdomen Pelvis Wo Contrast  09/02/2015  CLINICAL DATA:  Acute onset of generalized abdominal pain during dialysis. Nausea, vomiting, headache and sepsis. Initial encounter. EXAM: CT ABDOMEN AND PELVIS WITHOUT CONTRAST TECHNIQUE: Multidetector CT imaging of the abdomen and pelvis was performed following the standard protocol without IV contrast. COMPARISON:  Abdominal radiograph performed 04/18/2015, and CTA of the chest performed 11/11/2012 FINDINGS: Underlying diffuse interstitial changes and fibrotic change are noted at the left lung base, reflecting the patient's known chronic interstitial lung disease. Mild scarring is also noted at the right lung base. Diffuse coronary artery calcifications are seen. The patient's right femoral central line is noted ending within the right  atrium. The liver and spleen are unremarkable in appearance. The gallbladder is within normal limits. The pancreas and adrenal glands are unremarkable. Scattered nonobstructing bilateral renal stones are seen, measuring up to 9 mm in size. There is no evidence of hydronephrosis. No obstructing ureteral stones are seen. No significant perinephric stranding is appreciated. No free fluid is identified. The  small bowel is unremarkable in appearance. The stomach is within normal limits. No acute vascular abnormalities are seen. Mild scattered calcification is noted along the abdominal aorta and its branches. The appendix is normal in caliber, without evidence of appendicitis. Scattered diverticulosis is noted along the distal descending colon, without evidence of diverticulitis. The bladder is decompressed. Mild apparent bladder wall thickening may reflect cystitis or chronic inflammation. Would correlate with the patient's symptoms. The prostate remains normal in size. No inguinal lymphadenopathy is seen. No acute osseous abnormalities are identified. IMPRESSION: 1. No acute abnormality seen to explain the patient's symptoms. 2. Mild apparent bladder wall thickening may reflect chronic inflammation or possibly cystitis. Would correlate with the patient's symptoms. 3. Scattered diverticulosis along the distal descending colon, without evidence of diverticulitis. 4. Chronic interstitial lung disease again noted, with underlying fibrosis. Mild scarring at the right lung base. 5. Diffuse coronary artery calcifications seen. 6. Right femoral central line noted ending at the right atrium. 7. Scattered bilateral nonobstructing renal stones, measuring up to 9 mm in size. 8. Mild scattered calcification along the abdominal aorta and its branches. Electronically Signed   By: Garald Balding M.D.   On: 09/02/2015 22:44   Dg Chest Port 1 View  09/02/2015  CLINICAL DATA:  Sepsis. EXAM: PORTABLE CHEST 1 VIEW COMPARISON:  08/21/2015  chest radiograph. FINDINGS: Inferior approach central venous catheter terminates near the upper cavoatrial junction. Median sternotomy wire appears intact. Surgical clips overlie the lower midchest. Stable cardiomediastinal silhouette with normal heart size. No pneumothorax. Small right pleural effusion. No left pleural effusion. Volume loss and advanced interstitial lung disease in the native left lung. Mild scarring versus atelectasis at the right lung base. No acute consolidative airspace disease. IMPRESSION: 1. Small right pleural effusion. 2. Mild scarring versus atelectasis at the transplant right lung base. No acute consolidative airspace disease. 3. Stable advanced interstitial lung disease and volume loss in the native left lung. Electronically Signed   By: Ilona Sorrel M.D.   On: 09/02/2015 20:11   I have personally reviewed and evaluated these images and lab results as part of my medical decision-making.   EKG Interpretation None      MDM   Final diagnoses:  Sepsis, due to unspecified organism [facility])    Concern for sepsis given fever and hypotension. Given fluids per sepsis protocol. D/w Dr. 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 Recommends vanc, zosyn, and azithromycin. Does not recommend fungal coverage at this time. They want patient transferred to [facility]. Last BP prior to transfer 100/53. Feels well. Does not make urine. Given abd pain a CT obtained but is unremarkable. Flu swab pending   Sherwood Gambler, MD 09/02/15 [phone]

## 2015-09-02 NOTE — ED Notes (Signed)
Unable to get SPO2 from the monitor, pt is in a portable monitor reading 98% on 2 L [place].

## 2015-09-02 NOTE — ED Notes (Signed)
Pt brought to ED by [facility] after HD for pt been Hypotensive during HD today, HD completed, BP 98/54 fever on HD 101.0, 650 mg Tylenol given during HD, 600 ml fluid bolus given by [facility] 4 mg Zofran, HR 98, CBG 89. Pt denies any pain or discomfort at this time.

## 2015-09-03 DIAGNOSIS — G8929 Other chronic pain: Secondary | ICD-10-CM | POA: Diagnosis present

## 2015-09-03 DIAGNOSIS — I959 Hypotension, unspecified: Secondary | ICD-10-CM | POA: Diagnosis present

## 2015-09-03 DIAGNOSIS — J9621 Acute and chronic respiratory failure with hypoxia: Secondary | ICD-10-CM | POA: Diagnosis not present

## 2015-09-03 DIAGNOSIS — F329 Major depressive disorder, single episode, unspecified: Secondary | ICD-10-CM | POA: Diagnosis present

## 2015-09-03 DIAGNOSIS — B449 Aspergillosis, unspecified: Secondary | ICD-10-CM | POA: Diagnosis not present

## 2015-09-03 DIAGNOSIS — R509 Fever, unspecified: Secondary | ICD-10-CM | POA: Diagnosis not present

## 2015-09-03 DIAGNOSIS — M545 Low back pain: Secondary | ICD-10-CM | POA: Diagnosis present

## 2015-09-03 DIAGNOSIS — T80211A Bloodstream infection due to central venous catheter, initial encounter: Secondary | ICD-10-CM | POA: Diagnosis present

## 2015-09-03 DIAGNOSIS — T86819 Unspecified complication of lung transplant: Secondary | ICD-10-CM | POA: Diagnosis not present

## 2015-09-03 DIAGNOSIS — Z992 Dependence on renal dialysis: Secondary | ICD-10-CM | POA: Diagnosis not present

## 2015-09-03 DIAGNOSIS — Z794 Long term (current) use of insulin: Secondary | ICD-10-CM | POA: Diagnosis not present

## 2015-09-03 DIAGNOSIS — N186 End stage renal disease: Secondary | ICD-10-CM | POA: Diagnosis present

## 2015-09-03 DIAGNOSIS — E1122 Type 2 diabetes mellitus with diabetic chronic kidney disease: Secondary | ICD-10-CM | POA: Diagnosis present

## 2015-09-03 DIAGNOSIS — J9811 Atelectasis: Secondary | ICD-10-CM | POA: Diagnosis not present

## 2015-09-03 DIAGNOSIS — Z7952 Long term (current) use of systemic steroids: Secondary | ICD-10-CM | POA: Diagnosis not present

## 2015-09-03 DIAGNOSIS — D631 Anemia in chronic kidney disease: Secondary | ICD-10-CM | POA: Diagnosis present

## 2015-09-03 DIAGNOSIS — F419 Anxiety disorder, unspecified: Secondary | ICD-10-CM | POA: Diagnosis present

## 2015-09-03 DIAGNOSIS — J849 Interstitial pulmonary disease, unspecified: Secondary | ICD-10-CM | POA: Diagnosis not present

## 2015-09-03 DIAGNOSIS — I120 Hypertensive chronic kidney disease with stage 5 chronic kidney disease or end stage renal disease: Secondary | ICD-10-CM | POA: Diagnosis present

## 2015-09-03 DIAGNOSIS — K219 Gastro-esophageal reflux disease without esophagitis: Secondary | ICD-10-CM | POA: Diagnosis present

## 2015-09-03 DIAGNOSIS — J8489 Other specified interstitial pulmonary diseases: Secondary | ICD-10-CM | POA: Diagnosis present

## 2015-09-03 DIAGNOSIS — E0801 Diabetes mellitus due to underlying condition with hyperosmolarity with coma: Secondary | ICD-10-CM | POA: Diagnosis not present

## 2015-09-03 DIAGNOSIS — D899 Disorder involving the immune mechanism, unspecified: Secondary | ICD-10-CM | POA: Diagnosis not present

## 2015-09-03 DIAGNOSIS — Z79899 Other long term (current) drug therapy: Secondary | ICD-10-CM | POA: Diagnosis not present

## 2015-09-03 DIAGNOSIS — I951 Orthostatic hypotension: Secondary | ICD-10-CM | POA: Diagnosis not present

## 2015-09-03 DIAGNOSIS — Z87891 Personal history of nicotine dependence: Secondary | ICD-10-CM | POA: Diagnosis not present

## 2015-09-03 DIAGNOSIS — E11649 Type 2 diabetes mellitus with hypoglycemia without coma: Secondary | ICD-10-CM | POA: Diagnosis not present

## 2015-09-03 DIAGNOSIS — Z942 Lung transplant status: Secondary | ICD-10-CM | POA: Diagnosis not present

## 2015-09-03 DIAGNOSIS — Z136 Encounter for screening for cardiovascular disorders: Secondary | ICD-10-CM | POA: Diagnosis not present

## 2015-09-03 DIAGNOSIS — E8770 Fluid overload, unspecified: Secondary | ICD-10-CM | POA: Diagnosis not present

## 2015-09-03 DIAGNOSIS — T86818 Other complications of lung transplant: Secondary | ICD-10-CM | POA: Diagnosis not present

## 2015-09-03 DIAGNOSIS — Z8711 Personal history of peptic ulcer disease: Secondary | ICD-10-CM | POA: Diagnosis not present

## 2015-09-03 DIAGNOSIS — E785 Hyperlipidemia, unspecified: Secondary | ICD-10-CM | POA: Diagnosis present

## 2015-09-03 DIAGNOSIS — M79605 Pain in left leg: Secondary | ICD-10-CM | POA: Diagnosis not present

## 2015-09-03 DIAGNOSIS — J90 Pleural effusion, not elsewhere classified: Secondary | ICD-10-CM | POA: Diagnosis not present

## 2015-09-03 DIAGNOSIS — B441 Other pulmonary aspergillosis: Secondary | ICD-10-CM | POA: Diagnosis present

## 2015-09-03 DIAGNOSIS — G4733 Obstructive sleep apnea (adult) (pediatric): Secondary | ICD-10-CM | POA: Diagnosis present

## 2015-09-03 DIAGNOSIS — Z86718 Personal history of other venous thrombosis and embolism: Secondary | ICD-10-CM | POA: Diagnosis not present

## 2015-09-03 DIAGNOSIS — E0822 Diabetes mellitus due to underlying condition with diabetic chronic kidney disease: Secondary | ICD-10-CM | POA: Diagnosis not present

## 2015-09-03 LAB — INFLUENZA PANEL BY PCR (TYPE A & B)
H1N1FLUPCR: NOT DETECTED
INFLAPCR: NEGATIVE
Influenza B By PCR: NEGATIVE

## 2015-09-07 DIAGNOSIS — T8579XA Infection and inflammatory reaction due to other internal prosthetic devices, implants and grafts, initial encounter: Secondary | ICD-10-CM | POA: Diagnosis not present

## 2015-09-07 DIAGNOSIS — E1129 Type 2 diabetes mellitus with other diabetic kidney complication: Secondary | ICD-10-CM | POA: Diagnosis not present

## 2015-09-07 DIAGNOSIS — N2581 Secondary hyperparathyroidism of renal origin: Secondary | ICD-10-CM | POA: Diagnosis not present

## 2015-09-07 DIAGNOSIS — D631 Anemia in chronic kidney disease: Secondary | ICD-10-CM | POA: Diagnosis not present

## 2015-09-07 DIAGNOSIS — D509 Iron deficiency anemia, unspecified: Secondary | ICD-10-CM | POA: Diagnosis not present

## 2015-09-07 DIAGNOSIS — R6883 Chills (without fever): Secondary | ICD-10-CM | POA: Diagnosis not present

## 2015-09-07 DIAGNOSIS — N186 End stage renal disease: Secondary | ICD-10-CM | POA: Diagnosis not present

## 2015-09-07 LAB — CULTURE, BLOOD (ROUTINE X 2)
CULTURE: NO GROWTH
Culture: NO GROWTH

## 2015-09-09 DIAGNOSIS — D509 Iron deficiency anemia, unspecified: Secondary | ICD-10-CM | POA: Diagnosis not present

## 2015-09-09 DIAGNOSIS — N2581 Secondary hyperparathyroidism of renal origin: Secondary | ICD-10-CM | POA: Diagnosis not present

## 2015-09-09 DIAGNOSIS — R6883 Chills (without fever): Secondary | ICD-10-CM | POA: Diagnosis not present

## 2015-09-09 DIAGNOSIS — E1129 Type 2 diabetes mellitus with other diabetic kidney complication: Secondary | ICD-10-CM | POA: Diagnosis not present

## 2015-09-09 DIAGNOSIS — T8579XA Infection and inflammatory reaction due to other internal prosthetic devices, implants and grafts, initial encounter: Secondary | ICD-10-CM | POA: Diagnosis not present

## 2015-09-09 DIAGNOSIS — N186 End stage renal disease: Secondary | ICD-10-CM | POA: Diagnosis not present

## 2015-09-11 DIAGNOSIS — N2581 Secondary hyperparathyroidism of renal origin: Secondary | ICD-10-CM | POA: Diagnosis not present

## 2015-09-11 DIAGNOSIS — D509 Iron deficiency anemia, unspecified: Secondary | ICD-10-CM | POA: Diagnosis not present

## 2015-09-11 DIAGNOSIS — T8579XA Infection and inflammatory reaction due to other internal prosthetic devices, implants and grafts, initial encounter: Secondary | ICD-10-CM | POA: Diagnosis not present

## 2015-09-11 DIAGNOSIS — R6883 Chills (without fever): Secondary | ICD-10-CM | POA: Diagnosis not present

## 2015-09-11 DIAGNOSIS — E1129 Type 2 diabetes mellitus with other diabetic kidney complication: Secondary | ICD-10-CM | POA: Diagnosis not present

## 2015-09-11 DIAGNOSIS — N186 End stage renal disease: Secondary | ICD-10-CM | POA: Diagnosis not present

## 2015-09-14 DIAGNOSIS — N186 End stage renal disease: Secondary | ICD-10-CM | POA: Diagnosis not present

## 2015-09-14 DIAGNOSIS — N2581 Secondary hyperparathyroidism of renal origin: Secondary | ICD-10-CM | POA: Diagnosis not present

## 2015-09-14 DIAGNOSIS — R6883 Chills (without fever): Secondary | ICD-10-CM | POA: Diagnosis not present

## 2015-09-14 DIAGNOSIS — E1129 Type 2 diabetes mellitus with other diabetic kidney complication: Secondary | ICD-10-CM | POA: Diagnosis not present

## 2015-09-14 DIAGNOSIS — D509 Iron deficiency anemia, unspecified: Secondary | ICD-10-CM | POA: Diagnosis not present

## 2015-09-14 DIAGNOSIS — T8579XA Infection and inflammatory reaction due to other internal prosthetic devices, implants and grafts, initial encounter: Secondary | ICD-10-CM | POA: Diagnosis not present

## 2015-09-15 DIAGNOSIS — J90 Pleural effusion, not elsewhere classified: Secondary | ICD-10-CM | POA: Diagnosis not present

## 2015-09-15 DIAGNOSIS — G4700 Insomnia, unspecified: Secondary | ICD-10-CM | POA: Diagnosis not present

## 2015-09-15 DIAGNOSIS — F339 Major depressive disorder, recurrent, unspecified: Secondary | ICD-10-CM | POA: Diagnosis not present

## 2015-09-15 DIAGNOSIS — R918 Other nonspecific abnormal finding of lung field: Secondary | ICD-10-CM | POA: Diagnosis not present

## 2015-09-15 DIAGNOSIS — R5381 Other malaise: Secondary | ICD-10-CM | POA: Diagnosis not present

## 2015-09-15 DIAGNOSIS — Z942 Lung transplant status: Secondary | ICD-10-CM | POA: Diagnosis not present

## 2015-09-15 DIAGNOSIS — D899 Disorder involving the immune mechanism, unspecified: Secondary | ICD-10-CM | POA: Diagnosis not present

## 2015-09-15 DIAGNOSIS — Z992 Dependence on renal dialysis: Secondary | ICD-10-CM | POA: Diagnosis not present

## 2015-09-15 DIAGNOSIS — R251 Tremor, unspecified: Secondary | ICD-10-CM | POA: Diagnosis not present

## 2015-09-15 DIAGNOSIS — N186 End stage renal disease: Secondary | ICD-10-CM | POA: Diagnosis not present

## 2015-09-15 DIAGNOSIS — Z79899 Other long term (current) drug therapy: Secondary | ICD-10-CM | POA: Diagnosis not present

## 2015-09-15 DIAGNOSIS — Z4824 Encounter for aftercare following lung transplant: Secondary | ICD-10-CM | POA: Diagnosis not present

## 2015-09-16 DIAGNOSIS — D509 Iron deficiency anemia, unspecified: Secondary | ICD-10-CM | POA: Diagnosis not present

## 2015-09-16 DIAGNOSIS — N186 End stage renal disease: Secondary | ICD-10-CM | POA: Diagnosis not present

## 2015-09-16 DIAGNOSIS — N2581 Secondary hyperparathyroidism of renal origin: Secondary | ICD-10-CM | POA: Diagnosis not present

## 2015-09-16 DIAGNOSIS — T8579XA Infection and inflammatory reaction due to other internal prosthetic devices, implants and grafts, initial encounter: Secondary | ICD-10-CM | POA: Diagnosis not present

## 2015-09-16 DIAGNOSIS — R6883 Chills (without fever): Secondary | ICD-10-CM | POA: Diagnosis not present

## 2015-09-16 DIAGNOSIS — E1129 Type 2 diabetes mellitus with other diabetic kidney complication: Secondary | ICD-10-CM | POA: Diagnosis not present

## 2015-09-18 DIAGNOSIS — N2581 Secondary hyperparathyroidism of renal origin: Secondary | ICD-10-CM | POA: Diagnosis not present

## 2015-09-18 DIAGNOSIS — E1129 Type 2 diabetes mellitus with other diabetic kidney complication: Secondary | ICD-10-CM | POA: Diagnosis not present

## 2015-09-18 DIAGNOSIS — D509 Iron deficiency anemia, unspecified: Secondary | ICD-10-CM | POA: Diagnosis not present

## 2015-09-18 DIAGNOSIS — T8579XA Infection and inflammatory reaction due to other internal prosthetic devices, implants and grafts, initial encounter: Secondary | ICD-10-CM | POA: Diagnosis not present

## 2015-09-18 DIAGNOSIS — N186 End stage renal disease: Secondary | ICD-10-CM | POA: Diagnosis not present

## 2015-09-18 DIAGNOSIS — R6883 Chills (without fever): Secondary | ICD-10-CM | POA: Diagnosis not present

## 2015-09-21 DIAGNOSIS — E1129 Type 2 diabetes mellitus with other diabetic kidney complication: Secondary | ICD-10-CM | POA: Diagnosis not present

## 2015-09-21 DIAGNOSIS — N2581 Secondary hyperparathyroidism of renal origin: Secondary | ICD-10-CM | POA: Diagnosis not present

## 2015-09-21 DIAGNOSIS — T8579XA Infection and inflammatory reaction due to other internal prosthetic devices, implants and grafts, initial encounter: Secondary | ICD-10-CM | POA: Diagnosis not present

## 2015-09-21 DIAGNOSIS — R6883 Chills (without fever): Secondary | ICD-10-CM | POA: Diagnosis not present

## 2015-09-21 DIAGNOSIS — N186 End stage renal disease: Secondary | ICD-10-CM | POA: Diagnosis not present

## 2015-09-21 DIAGNOSIS — D509 Iron deficiency anemia, unspecified: Secondary | ICD-10-CM | POA: Diagnosis not present

## 2015-09-23 DIAGNOSIS — D509 Iron deficiency anemia, unspecified: Secondary | ICD-10-CM | POA: Diagnosis not present

## 2015-09-23 DIAGNOSIS — T8579XA Infection and inflammatory reaction due to other internal prosthetic devices, implants and grafts, initial encounter: Secondary | ICD-10-CM | POA: Diagnosis not present

## 2015-09-23 DIAGNOSIS — N2581 Secondary hyperparathyroidism of renal origin: Secondary | ICD-10-CM | POA: Diagnosis not present

## 2015-09-23 DIAGNOSIS — E1129 Type 2 diabetes mellitus with other diabetic kidney complication: Secondary | ICD-10-CM | POA: Diagnosis not present

## 2015-09-23 DIAGNOSIS — R6883 Chills (without fever): Secondary | ICD-10-CM | POA: Diagnosis not present

## 2015-09-23 DIAGNOSIS — N186 End stage renal disease: Secondary | ICD-10-CM | POA: Diagnosis not present

## 2015-09-25 DIAGNOSIS — D509 Iron deficiency anemia, unspecified: Secondary | ICD-10-CM | POA: Diagnosis not present

## 2015-09-25 DIAGNOSIS — N2581 Secondary hyperparathyroidism of renal origin: Secondary | ICD-10-CM | POA: Diagnosis not present

## 2015-09-25 DIAGNOSIS — E1129 Type 2 diabetes mellitus with other diabetic kidney complication: Secondary | ICD-10-CM | POA: Diagnosis not present

## 2015-09-25 DIAGNOSIS — T8579XA Infection and inflammatory reaction due to other internal prosthetic devices, implants and grafts, initial encounter: Secondary | ICD-10-CM | POA: Diagnosis not present

## 2015-09-25 DIAGNOSIS — N186 End stage renal disease: Secondary | ICD-10-CM | POA: Diagnosis not present

## 2015-09-25 DIAGNOSIS — R6883 Chills (without fever): Secondary | ICD-10-CM | POA: Diagnosis not present

## 2015-09-28 DIAGNOSIS — N2581 Secondary hyperparathyroidism of renal origin: Secondary | ICD-10-CM | POA: Diagnosis not present

## 2015-09-28 DIAGNOSIS — N186 End stage renal disease: Secondary | ICD-10-CM | POA: Diagnosis not present

## 2015-09-28 DIAGNOSIS — D509 Iron deficiency anemia, unspecified: Secondary | ICD-10-CM | POA: Diagnosis not present

## 2015-09-28 DIAGNOSIS — T8579XA Infection and inflammatory reaction due to other internal prosthetic devices, implants and grafts, initial encounter: Secondary | ICD-10-CM | POA: Diagnosis not present

## 2015-09-28 DIAGNOSIS — R6883 Chills (without fever): Secondary | ICD-10-CM | POA: Diagnosis not present

## 2015-09-28 DIAGNOSIS — E1129 Type 2 diabetes mellitus with other diabetic kidney complication: Secondary | ICD-10-CM | POA: Diagnosis not present

## 2015-09-30 DIAGNOSIS — T8579XA Infection and inflammatory reaction due to other internal prosthetic devices, implants and grafts, initial encounter: Secondary | ICD-10-CM | POA: Diagnosis not present

## 2015-09-30 DIAGNOSIS — N2581 Secondary hyperparathyroidism of renal origin: Secondary | ICD-10-CM | POA: Diagnosis not present

## 2015-09-30 DIAGNOSIS — N186 End stage renal disease: Secondary | ICD-10-CM | POA: Diagnosis not present

## 2015-09-30 DIAGNOSIS — E1129 Type 2 diabetes mellitus with other diabetic kidney complication: Secondary | ICD-10-CM | POA: Diagnosis not present

## 2015-09-30 DIAGNOSIS — R6883 Chills (without fever): Secondary | ICD-10-CM | POA: Diagnosis not present

## 2015-09-30 DIAGNOSIS — Z4824 Encounter for aftercare following lung transplant: Secondary | ICD-10-CM | POA: Diagnosis not present

## 2015-09-30 DIAGNOSIS — D509 Iron deficiency anemia, unspecified: Secondary | ICD-10-CM | POA: Diagnosis not present

## 2015-10-02 DIAGNOSIS — E1129 Type 2 diabetes mellitus with other diabetic kidney complication: Secondary | ICD-10-CM | POA: Diagnosis not present

## 2015-10-02 DIAGNOSIS — N186 End stage renal disease: Secondary | ICD-10-CM | POA: Diagnosis not present

## 2015-10-02 DIAGNOSIS — T8579XA Infection and inflammatory reaction due to other internal prosthetic devices, implants and grafts, initial encounter: Secondary | ICD-10-CM | POA: Diagnosis not present

## 2015-10-02 DIAGNOSIS — N2581 Secondary hyperparathyroidism of renal origin: Secondary | ICD-10-CM | POA: Diagnosis not present

## 2015-10-02 DIAGNOSIS — R6883 Chills (without fever): Secondary | ICD-10-CM | POA: Diagnosis not present

## 2015-10-02 DIAGNOSIS — D509 Iron deficiency anemia, unspecified: Secondary | ICD-10-CM | POA: Diagnosis not present

## 2015-10-04 DIAGNOSIS — Z992 Dependence on renal dialysis: Secondary | ICD-10-CM | POA: Diagnosis not present

## 2015-10-04 DIAGNOSIS — N186 End stage renal disease: Secondary | ICD-10-CM | POA: Diagnosis not present

## 2015-10-04 DIAGNOSIS — T86819 Unspecified complication of lung transplant: Secondary | ICD-10-CM | POA: Diagnosis not present

## 2015-10-05 DIAGNOSIS — D631 Anemia in chronic kidney disease: Secondary | ICD-10-CM | POA: Diagnosis not present

## 2015-10-05 DIAGNOSIS — E1129 Type 2 diabetes mellitus with other diabetic kidney complication: Secondary | ICD-10-CM | POA: Diagnosis not present

## 2015-10-05 DIAGNOSIS — N2581 Secondary hyperparathyroidism of renal origin: Secondary | ICD-10-CM | POA: Diagnosis not present

## 2015-10-05 DIAGNOSIS — N390 Urinary tract infection, site not specified: Secondary | ICD-10-CM | POA: Diagnosis not present

## 2015-10-05 DIAGNOSIS — Z23 Encounter for immunization: Secondary | ICD-10-CM | POA: Diagnosis not present

## 2015-10-05 DIAGNOSIS — N186 End stage renal disease: Secondary | ICD-10-CM | POA: Diagnosis not present

## 2015-10-05 DIAGNOSIS — T8579XA Infection and inflammatory reaction due to other internal prosthetic devices, implants and grafts, initial encounter: Secondary | ICD-10-CM | POA: Diagnosis not present

## 2015-10-05 DIAGNOSIS — D509 Iron deficiency anemia, unspecified: Secondary | ICD-10-CM | POA: Diagnosis not present

## 2015-10-07 DIAGNOSIS — T8579XA Infection and inflammatory reaction due to other internal prosthetic devices, implants and grafts, initial encounter: Secondary | ICD-10-CM | POA: Diagnosis not present

## 2015-10-07 DIAGNOSIS — N390 Urinary tract infection, site not specified: Secondary | ICD-10-CM | POA: Diagnosis not present

## 2015-10-07 DIAGNOSIS — D509 Iron deficiency anemia, unspecified: Secondary | ICD-10-CM | POA: Diagnosis not present

## 2015-10-07 DIAGNOSIS — N186 End stage renal disease: Secondary | ICD-10-CM | POA: Diagnosis not present

## 2015-10-07 DIAGNOSIS — Z23 Encounter for immunization: Secondary | ICD-10-CM | POA: Diagnosis not present

## 2015-10-07 DIAGNOSIS — N2581 Secondary hyperparathyroidism of renal origin: Secondary | ICD-10-CM | POA: Diagnosis not present

## 2015-10-08 DIAGNOSIS — Z942 Lung transplant status: Secondary | ICD-10-CM | POA: Diagnosis not present

## 2015-10-08 DIAGNOSIS — Z4824 Encounter for aftercare following lung transplant: Secondary | ICD-10-CM | POA: Diagnosis not present

## 2015-10-08 DIAGNOSIS — N185 Chronic kidney disease, stage 5: Secondary | ICD-10-CM | POA: Diagnosis not present

## 2015-10-08 DIAGNOSIS — T380X5A Adverse effect of glucocorticoids and synthetic analogues, initial encounter: Secondary | ICD-10-CM | POA: Diagnosis not present

## 2015-10-08 DIAGNOSIS — R739 Hyperglycemia, unspecified: Secondary | ICD-10-CM | POA: Diagnosis not present

## 2015-10-08 DIAGNOSIS — E782 Mixed hyperlipidemia: Secondary | ICD-10-CM | POA: Diagnosis not present

## 2015-10-08 DIAGNOSIS — R6881 Early satiety: Secondary | ICD-10-CM | POA: Diagnosis not present

## 2015-10-08 DIAGNOSIS — Z8719 Personal history of other diseases of the digestive system: Secondary | ICD-10-CM | POA: Diagnosis not present

## 2015-10-08 DIAGNOSIS — J9611 Chronic respiratory failure with hypoxia: Secondary | ICD-10-CM | POA: Diagnosis not present

## 2015-10-08 DIAGNOSIS — G4700 Insomnia, unspecified: Secondary | ICD-10-CM | POA: Diagnosis not present

## 2015-10-08 DIAGNOSIS — T86818 Other complications of lung transplant: Secondary | ICD-10-CM | POA: Diagnosis not present

## 2015-10-08 DIAGNOSIS — D899 Disorder involving the immune mechanism, unspecified: Secondary | ICD-10-CM | POA: Diagnosis not present

## 2015-10-09 DIAGNOSIS — N390 Urinary tract infection, site not specified: Secondary | ICD-10-CM | POA: Diagnosis not present

## 2015-10-09 DIAGNOSIS — Z23 Encounter for immunization: Secondary | ICD-10-CM | POA: Diagnosis not present

## 2015-10-09 DIAGNOSIS — N2581 Secondary hyperparathyroidism of renal origin: Secondary | ICD-10-CM | POA: Diagnosis not present

## 2015-10-09 DIAGNOSIS — T8579XA Infection and inflammatory reaction due to other internal prosthetic devices, implants and grafts, initial encounter: Secondary | ICD-10-CM | POA: Diagnosis not present

## 2015-10-09 DIAGNOSIS — N186 End stage renal disease: Secondary | ICD-10-CM | POA: Diagnosis not present

## 2015-10-09 DIAGNOSIS — D509 Iron deficiency anemia, unspecified: Secondary | ICD-10-CM | POA: Diagnosis not present

## 2015-10-12 DIAGNOSIS — N390 Urinary tract infection, site not specified: Secondary | ICD-10-CM | POA: Diagnosis not present

## 2015-10-12 DIAGNOSIS — T8579XA Infection and inflammatory reaction due to other internal prosthetic devices, implants and grafts, initial encounter: Secondary | ICD-10-CM | POA: Diagnosis not present

## 2015-10-12 DIAGNOSIS — D509 Iron deficiency anemia, unspecified: Secondary | ICD-10-CM | POA: Diagnosis not present

## 2015-10-12 DIAGNOSIS — N2581 Secondary hyperparathyroidism of renal origin: Secondary | ICD-10-CM | POA: Diagnosis not present

## 2015-10-12 DIAGNOSIS — N186 End stage renal disease: Secondary | ICD-10-CM | POA: Diagnosis not present

## 2015-10-12 DIAGNOSIS — Z23 Encounter for immunization: Secondary | ICD-10-CM | POA: Diagnosis not present

## 2015-10-14 DIAGNOSIS — N186 End stage renal disease: Secondary | ICD-10-CM | POA: Diagnosis not present

## 2015-10-14 DIAGNOSIS — D509 Iron deficiency anemia, unspecified: Secondary | ICD-10-CM | POA: Diagnosis not present

## 2015-10-14 DIAGNOSIS — T8579XA Infection and inflammatory reaction due to other internal prosthetic devices, implants and grafts, initial encounter: Secondary | ICD-10-CM | POA: Diagnosis not present

## 2015-10-14 DIAGNOSIS — Z23 Encounter for immunization: Secondary | ICD-10-CM | POA: Diagnosis not present

## 2015-10-14 DIAGNOSIS — N390 Urinary tract infection, site not specified: Secondary | ICD-10-CM | POA: Diagnosis not present

## 2015-10-14 DIAGNOSIS — N2581 Secondary hyperparathyroidism of renal origin: Secondary | ICD-10-CM | POA: Diagnosis not present

## 2015-10-16 DIAGNOSIS — D509 Iron deficiency anemia, unspecified: Secondary | ICD-10-CM | POA: Diagnosis not present

## 2015-10-16 DIAGNOSIS — N2581 Secondary hyperparathyroidism of renal origin: Secondary | ICD-10-CM | POA: Diagnosis not present

## 2015-10-16 DIAGNOSIS — T8579XA Infection and inflammatory reaction due to other internal prosthetic devices, implants and grafts, initial encounter: Secondary | ICD-10-CM | POA: Diagnosis not present

## 2015-10-16 DIAGNOSIS — Z23 Encounter for immunization: Secondary | ICD-10-CM | POA: Diagnosis not present

## 2015-10-16 DIAGNOSIS — N186 End stage renal disease: Secondary | ICD-10-CM | POA: Diagnosis not present

## 2015-10-16 DIAGNOSIS — N390 Urinary tract infection, site not specified: Secondary | ICD-10-CM | POA: Diagnosis not present

## 2015-10-19 DIAGNOSIS — D509 Iron deficiency anemia, unspecified: Secondary | ICD-10-CM | POA: Diagnosis not present

## 2015-10-19 DIAGNOSIS — Z23 Encounter for immunization: Secondary | ICD-10-CM | POA: Diagnosis not present

## 2015-10-19 DIAGNOSIS — N390 Urinary tract infection, site not specified: Secondary | ICD-10-CM | POA: Diagnosis not present

## 2015-10-19 DIAGNOSIS — N2581 Secondary hyperparathyroidism of renal origin: Secondary | ICD-10-CM | POA: Diagnosis not present

## 2015-10-19 DIAGNOSIS — N186 End stage renal disease: Secondary | ICD-10-CM | POA: Diagnosis not present

## 2015-10-19 DIAGNOSIS — T8579XA Infection and inflammatory reaction due to other internal prosthetic devices, implants and grafts, initial encounter: Secondary | ICD-10-CM | POA: Diagnosis not present

## 2015-10-21 DIAGNOSIS — N390 Urinary tract infection, site not specified: Secondary | ICD-10-CM | POA: Diagnosis not present

## 2015-10-21 DIAGNOSIS — D509 Iron deficiency anemia, unspecified: Secondary | ICD-10-CM | POA: Diagnosis not present

## 2015-10-21 DIAGNOSIS — N2581 Secondary hyperparathyroidism of renal origin: Secondary | ICD-10-CM | POA: Diagnosis not present

## 2015-10-21 DIAGNOSIS — T8579XA Infection and inflammatory reaction due to other internal prosthetic devices, implants and grafts, initial encounter: Secondary | ICD-10-CM | POA: Diagnosis not present

## 2015-10-21 DIAGNOSIS — Z23 Encounter for immunization: Secondary | ICD-10-CM | POA: Diagnosis not present

## 2015-10-21 DIAGNOSIS — N186 End stage renal disease: Secondary | ICD-10-CM | POA: Diagnosis not present

## 2015-10-23 DIAGNOSIS — N186 End stage renal disease: Secondary | ICD-10-CM | POA: Diagnosis not present

## 2015-10-23 DIAGNOSIS — N2581 Secondary hyperparathyroidism of renal origin: Secondary | ICD-10-CM | POA: Diagnosis not present

## 2015-10-23 DIAGNOSIS — T8579XA Infection and inflammatory reaction due to other internal prosthetic devices, implants and grafts, initial encounter: Secondary | ICD-10-CM | POA: Diagnosis not present

## 2015-10-23 DIAGNOSIS — D509 Iron deficiency anemia, unspecified: Secondary | ICD-10-CM | POA: Diagnosis not present

## 2015-10-23 DIAGNOSIS — Z23 Encounter for immunization: Secondary | ICD-10-CM | POA: Diagnosis not present

## 2015-10-23 DIAGNOSIS — N390 Urinary tract infection, site not specified: Secondary | ICD-10-CM | POA: Diagnosis not present

## 2015-10-26 DIAGNOSIS — T8579XA Infection and inflammatory reaction due to other internal prosthetic devices, implants and grafts, initial encounter: Secondary | ICD-10-CM | POA: Diagnosis not present

## 2015-10-26 DIAGNOSIS — Z23 Encounter for immunization: Secondary | ICD-10-CM | POA: Diagnosis not present

## 2015-10-26 DIAGNOSIS — D509 Iron deficiency anemia, unspecified: Secondary | ICD-10-CM | POA: Diagnosis not present

## 2015-10-26 DIAGNOSIS — N186 End stage renal disease: Secondary | ICD-10-CM | POA: Diagnosis not present

## 2015-10-26 DIAGNOSIS — N390 Urinary tract infection, site not specified: Secondary | ICD-10-CM | POA: Diagnosis not present

## 2015-10-26 DIAGNOSIS — N2581 Secondary hyperparathyroidism of renal origin: Secondary | ICD-10-CM | POA: Diagnosis not present

## 2015-10-28 DIAGNOSIS — N390 Urinary tract infection, site not specified: Secondary | ICD-10-CM | POA: Diagnosis not present

## 2015-10-28 DIAGNOSIS — T8579XA Infection and inflammatory reaction due to other internal prosthetic devices, implants and grafts, initial encounter: Secondary | ICD-10-CM | POA: Diagnosis not present

## 2015-10-28 DIAGNOSIS — N186 End stage renal disease: Secondary | ICD-10-CM | POA: Diagnosis not present

## 2015-10-28 DIAGNOSIS — Z4824 Encounter for aftercare following lung transplant: Secondary | ICD-10-CM | POA: Diagnosis not present

## 2015-10-28 DIAGNOSIS — Z23 Encounter for immunization: Secondary | ICD-10-CM | POA: Diagnosis not present

## 2015-10-28 DIAGNOSIS — D509 Iron deficiency anemia, unspecified: Secondary | ICD-10-CM | POA: Diagnosis not present

## 2015-10-28 DIAGNOSIS — N2581 Secondary hyperparathyroidism of renal origin: Secondary | ICD-10-CM | POA: Diagnosis not present

## 2015-10-30 DIAGNOSIS — T8579XA Infection and inflammatory reaction due to other internal prosthetic devices, implants and grafts, initial encounter: Secondary | ICD-10-CM | POA: Diagnosis not present

## 2015-10-30 DIAGNOSIS — N186 End stage renal disease: Secondary | ICD-10-CM | POA: Diagnosis not present

## 2015-10-30 DIAGNOSIS — D509 Iron deficiency anemia, unspecified: Secondary | ICD-10-CM | POA: Diagnosis not present

## 2015-10-30 DIAGNOSIS — N2581 Secondary hyperparathyroidism of renal origin: Secondary | ICD-10-CM | POA: Diagnosis not present

## 2015-10-30 DIAGNOSIS — N390 Urinary tract infection, site not specified: Secondary | ICD-10-CM | POA: Diagnosis not present

## 2015-10-30 DIAGNOSIS — Z23 Encounter for immunization: Secondary | ICD-10-CM | POA: Diagnosis not present

## 2015-11-02 DIAGNOSIS — T8579XA Infection and inflammatory reaction due to other internal prosthetic devices, implants and grafts, initial encounter: Secondary | ICD-10-CM | POA: Diagnosis not present

## 2015-11-02 DIAGNOSIS — N2581 Secondary hyperparathyroidism of renal origin: Secondary | ICD-10-CM | POA: Diagnosis not present

## 2015-11-02 DIAGNOSIS — N390 Urinary tract infection, site not specified: Secondary | ICD-10-CM | POA: Diagnosis not present

## 2015-11-02 DIAGNOSIS — Z23 Encounter for immunization: Secondary | ICD-10-CM | POA: Diagnosis not present

## 2015-11-02 DIAGNOSIS — N186 End stage renal disease: Secondary | ICD-10-CM | POA: Diagnosis not present

## 2015-11-02 DIAGNOSIS — D509 Iron deficiency anemia, unspecified: Secondary | ICD-10-CM | POA: Diagnosis not present

## 2015-11-03 DIAGNOSIS — D509 Iron deficiency anemia, unspecified: Secondary | ICD-10-CM | POA: Diagnosis not present

## 2015-11-03 DIAGNOSIS — T8579XA Infection and inflammatory reaction due to other internal prosthetic devices, implants and grafts, initial encounter: Secondary | ICD-10-CM | POA: Diagnosis not present

## 2015-11-03 DIAGNOSIS — N186 End stage renal disease: Secondary | ICD-10-CM | POA: Diagnosis not present

## 2015-11-03 DIAGNOSIS — Z23 Encounter for immunization: Secondary | ICD-10-CM | POA: Diagnosis not present

## 2015-11-03 DIAGNOSIS — N2581 Secondary hyperparathyroidism of renal origin: Secondary | ICD-10-CM | POA: Diagnosis not present

## 2015-11-03 DIAGNOSIS — N390 Urinary tract infection, site not specified: Secondary | ICD-10-CM | POA: Diagnosis not present

## 2015-11-04 DIAGNOSIS — N186 End stage renal disease: Secondary | ICD-10-CM | POA: Diagnosis not present

## 2015-11-04 DIAGNOSIS — Z992 Dependence on renal dialysis: Secondary | ICD-10-CM | POA: Diagnosis not present

## 2015-11-04 DIAGNOSIS — T86819 Unspecified complication of lung transplant: Secondary | ICD-10-CM | POA: Diagnosis not present

## 2015-11-06 DIAGNOSIS — N186 End stage renal disease: Secondary | ICD-10-CM | POA: Diagnosis not present

## 2015-11-06 DIAGNOSIS — I159 Secondary hypertension, unspecified: Secondary | ICD-10-CM | POA: Diagnosis not present

## 2015-11-09 DIAGNOSIS — E1129 Type 2 diabetes mellitus with other diabetic kidney complication: Secondary | ICD-10-CM | POA: Diagnosis not present

## 2015-11-09 DIAGNOSIS — D509 Iron deficiency anemia, unspecified: Secondary | ICD-10-CM | POA: Diagnosis not present

## 2015-11-09 DIAGNOSIS — N2581 Secondary hyperparathyroidism of renal origin: Secondary | ICD-10-CM | POA: Diagnosis not present

## 2015-11-09 DIAGNOSIS — T8579XA Infection and inflammatory reaction due to other internal prosthetic devices, implants and grafts, initial encounter: Secondary | ICD-10-CM | POA: Diagnosis not present

## 2015-11-09 DIAGNOSIS — Z23 Encounter for immunization: Secondary | ICD-10-CM | POA: Diagnosis not present

## 2015-11-09 DIAGNOSIS — N186 End stage renal disease: Secondary | ICD-10-CM | POA: Diagnosis not present

## 2015-11-11 DIAGNOSIS — E1129 Type 2 diabetes mellitus with other diabetic kidney complication: Secondary | ICD-10-CM | POA: Diagnosis not present

## 2015-11-11 DIAGNOSIS — D509 Iron deficiency anemia, unspecified: Secondary | ICD-10-CM | POA: Diagnosis not present

## 2015-11-11 DIAGNOSIS — T8579XA Infection and inflammatory reaction due to other internal prosthetic devices, implants and grafts, initial encounter: Secondary | ICD-10-CM | POA: Diagnosis not present

## 2015-11-11 DIAGNOSIS — N186 End stage renal disease: Secondary | ICD-10-CM | POA: Diagnosis not present

## 2015-11-11 DIAGNOSIS — Z23 Encounter for immunization: Secondary | ICD-10-CM | POA: Diagnosis not present

## 2015-11-11 DIAGNOSIS — N2581 Secondary hyperparathyroidism of renal origin: Secondary | ICD-10-CM | POA: Diagnosis not present

## 2015-11-13 DIAGNOSIS — T8579XA Infection and inflammatory reaction due to other internal prosthetic devices, implants and grafts, initial encounter: Secondary | ICD-10-CM | POA: Diagnosis not present

## 2015-11-13 DIAGNOSIS — N2581 Secondary hyperparathyroidism of renal origin: Secondary | ICD-10-CM | POA: Diagnosis not present

## 2015-11-13 DIAGNOSIS — N186 End stage renal disease: Secondary | ICD-10-CM | POA: Diagnosis not present

## 2015-11-13 DIAGNOSIS — Z23 Encounter for immunization: Secondary | ICD-10-CM | POA: Diagnosis not present

## 2015-11-13 DIAGNOSIS — E1129 Type 2 diabetes mellitus with other diabetic kidney complication: Secondary | ICD-10-CM | POA: Diagnosis not present

## 2015-11-13 DIAGNOSIS — D509 Iron deficiency anemia, unspecified: Secondary | ICD-10-CM | POA: Diagnosis not present

## 2015-11-16 DIAGNOSIS — D509 Iron deficiency anemia, unspecified: Secondary | ICD-10-CM | POA: Diagnosis not present

## 2015-11-16 DIAGNOSIS — N186 End stage renal disease: Secondary | ICD-10-CM | POA: Diagnosis not present

## 2015-11-16 DIAGNOSIS — E1129 Type 2 diabetes mellitus with other diabetic kidney complication: Secondary | ICD-10-CM | POA: Diagnosis not present

## 2015-11-16 DIAGNOSIS — N2581 Secondary hyperparathyroidism of renal origin: Secondary | ICD-10-CM | POA: Diagnosis not present

## 2015-11-16 DIAGNOSIS — T8579XA Infection and inflammatory reaction due to other internal prosthetic devices, implants and grafts, initial encounter: Secondary | ICD-10-CM | POA: Diagnosis not present

## 2015-11-16 DIAGNOSIS — Z23 Encounter for immunization: Secondary | ICD-10-CM | POA: Diagnosis not present

## 2015-11-18 DIAGNOSIS — Z23 Encounter for immunization: Secondary | ICD-10-CM | POA: Diagnosis not present

## 2015-11-18 DIAGNOSIS — N186 End stage renal disease: Secondary | ICD-10-CM | POA: Diagnosis not present

## 2015-11-18 DIAGNOSIS — N2581 Secondary hyperparathyroidism of renal origin: Secondary | ICD-10-CM | POA: Diagnosis not present

## 2015-11-18 DIAGNOSIS — E1129 Type 2 diabetes mellitus with other diabetic kidney complication: Secondary | ICD-10-CM | POA: Diagnosis not present

## 2015-11-18 DIAGNOSIS — D509 Iron deficiency anemia, unspecified: Secondary | ICD-10-CM | POA: Diagnosis not present

## 2015-11-18 DIAGNOSIS — T8579XA Infection and inflammatory reaction due to other internal prosthetic devices, implants and grafts, initial encounter: Secondary | ICD-10-CM | POA: Diagnosis not present

## 2015-11-19 DIAGNOSIS — Z992 Dependence on renal dialysis: Secondary | ICD-10-CM | POA: Diagnosis not present

## 2015-11-19 DIAGNOSIS — Z942 Lung transplant status: Secondary | ICD-10-CM | POA: Diagnosis not present

## 2015-11-19 DIAGNOSIS — Z4824 Encounter for aftercare following lung transplant: Secondary | ICD-10-CM | POA: Diagnosis not present

## 2015-11-19 DIAGNOSIS — Z794 Long term (current) use of insulin: Secondary | ICD-10-CM | POA: Diagnosis not present

## 2015-11-19 DIAGNOSIS — G4700 Insomnia, unspecified: Secondary | ICD-10-CM | POA: Diagnosis not present

## 2015-11-19 DIAGNOSIS — R739 Hyperglycemia, unspecified: Secondary | ICD-10-CM | POA: Diagnosis not present

## 2015-11-19 DIAGNOSIS — R6881 Early satiety: Secondary | ICD-10-CM | POA: Diagnosis not present

## 2015-11-19 DIAGNOSIS — N186 End stage renal disease: Secondary | ICD-10-CM | POA: Diagnosis not present

## 2015-11-19 DIAGNOSIS — E785 Hyperlipidemia, unspecified: Secondary | ICD-10-CM | POA: Diagnosis not present

## 2015-11-19 DIAGNOSIS — T380X5D Adverse effect of glucocorticoids and synthetic analogues, subsequent encounter: Secondary | ICD-10-CM | POA: Diagnosis not present

## 2015-11-19 DIAGNOSIS — R319 Hematuria, unspecified: Secondary | ICD-10-CM | POA: Diagnosis not present

## 2015-11-19 DIAGNOSIS — Z87440 Personal history of urinary (tract) infections: Secondary | ICD-10-CM | POA: Diagnosis not present

## 2015-11-19 DIAGNOSIS — R251 Tremor, unspecified: Secondary | ICD-10-CM | POA: Diagnosis not present

## 2015-11-19 DIAGNOSIS — R918 Other nonspecific abnormal finding of lung field: Secondary | ICD-10-CM | POA: Diagnosis not present

## 2015-11-19 DIAGNOSIS — J90 Pleural effusion, not elsewhere classified: Secondary | ICD-10-CM | POA: Diagnosis not present

## 2015-11-20 DIAGNOSIS — N2581 Secondary hyperparathyroidism of renal origin: Secondary | ICD-10-CM | POA: Diagnosis not present

## 2015-11-20 DIAGNOSIS — D509 Iron deficiency anemia, unspecified: Secondary | ICD-10-CM | POA: Diagnosis not present

## 2015-11-20 DIAGNOSIS — E1129 Type 2 diabetes mellitus with other diabetic kidney complication: Secondary | ICD-10-CM | POA: Diagnosis not present

## 2015-11-20 DIAGNOSIS — N186 End stage renal disease: Secondary | ICD-10-CM | POA: Diagnosis not present

## 2015-11-20 DIAGNOSIS — Z23 Encounter for immunization: Secondary | ICD-10-CM | POA: Diagnosis not present

## 2015-11-20 DIAGNOSIS — T8579XA Infection and inflammatory reaction due to other internal prosthetic devices, implants and grafts, initial encounter: Secondary | ICD-10-CM | POA: Diagnosis not present

## 2015-11-23 DIAGNOSIS — N2581 Secondary hyperparathyroidism of renal origin: Secondary | ICD-10-CM | POA: Diagnosis not present

## 2015-11-23 DIAGNOSIS — Z23 Encounter for immunization: Secondary | ICD-10-CM | POA: Diagnosis not present

## 2015-11-23 DIAGNOSIS — E1129 Type 2 diabetes mellitus with other diabetic kidney complication: Secondary | ICD-10-CM | POA: Diagnosis not present

## 2015-11-23 DIAGNOSIS — D509 Iron deficiency anemia, unspecified: Secondary | ICD-10-CM | POA: Diagnosis not present

## 2015-11-23 DIAGNOSIS — T8579XA Infection and inflammatory reaction due to other internal prosthetic devices, implants and grafts, initial encounter: Secondary | ICD-10-CM | POA: Diagnosis not present

## 2015-11-23 DIAGNOSIS — N186 End stage renal disease: Secondary | ICD-10-CM | POA: Diagnosis not present

## 2015-11-25 DIAGNOSIS — T8579XA Infection and inflammatory reaction due to other internal prosthetic devices, implants and grafts, initial encounter: Secondary | ICD-10-CM | POA: Diagnosis not present

## 2015-11-25 DIAGNOSIS — E1129 Type 2 diabetes mellitus with other diabetic kidney complication: Secondary | ICD-10-CM | POA: Diagnosis not present

## 2015-11-25 DIAGNOSIS — D509 Iron deficiency anemia, unspecified: Secondary | ICD-10-CM | POA: Diagnosis not present

## 2015-11-25 DIAGNOSIS — N186 End stage renal disease: Secondary | ICD-10-CM | POA: Diagnosis not present

## 2015-11-25 DIAGNOSIS — Z23 Encounter for immunization: Secondary | ICD-10-CM | POA: Diagnosis not present

## 2015-11-25 DIAGNOSIS — N2581 Secondary hyperparathyroidism of renal origin: Secondary | ICD-10-CM | POA: Diagnosis not present

## 2015-11-28 DIAGNOSIS — E1129 Type 2 diabetes mellitus with other diabetic kidney complication: Secondary | ICD-10-CM | POA: Diagnosis not present

## 2015-11-28 DIAGNOSIS — N2581 Secondary hyperparathyroidism of renal origin: Secondary | ICD-10-CM | POA: Diagnosis not present

## 2015-11-28 DIAGNOSIS — N186 End stage renal disease: Secondary | ICD-10-CM | POA: Diagnosis not present

## 2015-11-28 DIAGNOSIS — D509 Iron deficiency anemia, unspecified: Secondary | ICD-10-CM | POA: Diagnosis not present

## 2015-11-28 DIAGNOSIS — T8579XA Infection and inflammatory reaction due to other internal prosthetic devices, implants and grafts, initial encounter: Secondary | ICD-10-CM | POA: Diagnosis not present

## 2015-11-28 DIAGNOSIS — Z23 Encounter for immunization: Secondary | ICD-10-CM | POA: Diagnosis not present

## 2015-11-30 DIAGNOSIS — D509 Iron deficiency anemia, unspecified: Secondary | ICD-10-CM | POA: Diagnosis not present

## 2015-11-30 DIAGNOSIS — E1129 Type 2 diabetes mellitus with other diabetic kidney complication: Secondary | ICD-10-CM | POA: Diagnosis not present

## 2015-11-30 DIAGNOSIS — N2581 Secondary hyperparathyroidism of renal origin: Secondary | ICD-10-CM | POA: Diagnosis not present

## 2015-11-30 DIAGNOSIS — N186 End stage renal disease: Secondary | ICD-10-CM | POA: Diagnosis not present

## 2015-11-30 DIAGNOSIS — T8579XA Infection and inflammatory reaction due to other internal prosthetic devices, implants and grafts, initial encounter: Secondary | ICD-10-CM | POA: Diagnosis not present

## 2015-11-30 DIAGNOSIS — Z23 Encounter for immunization: Secondary | ICD-10-CM | POA: Diagnosis not present

## 2015-12-01 DIAGNOSIS — E0822 Diabetes mellitus due to underlying condition with diabetic chronic kidney disease: Secondary | ICD-10-CM | POA: Diagnosis not present

## 2015-12-01 DIAGNOSIS — Z794 Long term (current) use of insulin: Secondary | ICD-10-CM | POA: Diagnosis not present

## 2015-12-02 DIAGNOSIS — N186 End stage renal disease: Secondary | ICD-10-CM | POA: Diagnosis not present

## 2015-12-02 DIAGNOSIS — N2581 Secondary hyperparathyroidism of renal origin: Secondary | ICD-10-CM | POA: Diagnosis not present

## 2015-12-02 DIAGNOSIS — E1129 Type 2 diabetes mellitus with other diabetic kidney complication: Secondary | ICD-10-CM | POA: Diagnosis not present

## 2015-12-02 DIAGNOSIS — Z23 Encounter for immunization: Secondary | ICD-10-CM | POA: Diagnosis not present

## 2015-12-02 DIAGNOSIS — Z4824 Encounter for aftercare following lung transplant: Secondary | ICD-10-CM | POA: Diagnosis not present

## 2015-12-02 DIAGNOSIS — T8579XA Infection and inflammatory reaction due to other internal prosthetic devices, implants and grafts, initial encounter: Secondary | ICD-10-CM | POA: Diagnosis not present

## 2015-12-02 DIAGNOSIS — D509 Iron deficiency anemia, unspecified: Secondary | ICD-10-CM | POA: Diagnosis not present

## 2015-12-04 DIAGNOSIS — N186 End stage renal disease: Secondary | ICD-10-CM | POA: Diagnosis not present

## 2015-12-04 DIAGNOSIS — T86819 Unspecified complication of lung transplant: Secondary | ICD-10-CM | POA: Diagnosis not present

## 2015-12-04 DIAGNOSIS — Z992 Dependence on renal dialysis: Secondary | ICD-10-CM | POA: Diagnosis not present

## 2015-12-05 DIAGNOSIS — J9691 Respiratory failure, unspecified with hypoxia: Secondary | ICD-10-CM

## 2015-12-05 DIAGNOSIS — D631 Anemia in chronic kidney disease: Secondary | ICD-10-CM | POA: Diagnosis not present

## 2015-12-05 DIAGNOSIS — N2581 Secondary hyperparathyroidism of renal origin: Secondary | ICD-10-CM | POA: Diagnosis not present

## 2015-12-05 DIAGNOSIS — N186 End stage renal disease: Secondary | ICD-10-CM | POA: Diagnosis not present

## 2015-12-05 DIAGNOSIS — T8579XA Infection and inflammatory reaction due to other internal prosthetic devices, implants and grafts, initial encounter: Secondary | ICD-10-CM | POA: Diagnosis not present

## 2015-12-05 DIAGNOSIS — N390 Urinary tract infection, site not specified: Secondary | ICD-10-CM | POA: Diagnosis not present

## 2015-12-05 HISTORY — DX: Respiratory failure, unspecified with hypoxia: J96.91

## 2015-12-07 DIAGNOSIS — T8579XA Infection and inflammatory reaction due to other internal prosthetic devices, implants and grafts, initial encounter: Secondary | ICD-10-CM | POA: Diagnosis not present

## 2015-12-07 DIAGNOSIS — D631 Anemia in chronic kidney disease: Secondary | ICD-10-CM | POA: Diagnosis not present

## 2015-12-07 DIAGNOSIS — N2581 Secondary hyperparathyroidism of renal origin: Secondary | ICD-10-CM | POA: Diagnosis not present

## 2015-12-07 DIAGNOSIS — N186 End stage renal disease: Secondary | ICD-10-CM | POA: Diagnosis not present

## 2015-12-07 DIAGNOSIS — N390 Urinary tract infection, site not specified: Secondary | ICD-10-CM | POA: Diagnosis not present

## 2015-12-09 DIAGNOSIS — N2581 Secondary hyperparathyroidism of renal origin: Secondary | ICD-10-CM | POA: Diagnosis not present

## 2015-12-09 DIAGNOSIS — D899 Disorder involving the immune mechanism, unspecified: Secondary | ICD-10-CM | POA: Diagnosis not present

## 2015-12-09 DIAGNOSIS — N186 End stage renal disease: Secondary | ICD-10-CM | POA: Diagnosis not present

## 2015-12-09 DIAGNOSIS — D631 Anemia in chronic kidney disease: Secondary | ICD-10-CM | POA: Diagnosis not present

## 2015-12-09 DIAGNOSIS — Z942 Lung transplant status: Secondary | ICD-10-CM | POA: Diagnosis not present

## 2015-12-09 DIAGNOSIS — T86819 Unspecified complication of lung transplant: Secondary | ICD-10-CM | POA: Diagnosis not present

## 2015-12-09 DIAGNOSIS — Z79899 Other long term (current) drug therapy: Secondary | ICD-10-CM | POA: Diagnosis not present

## 2015-12-09 DIAGNOSIS — T8579XA Infection and inflammatory reaction due to other internal prosthetic devices, implants and grafts, initial encounter: Secondary | ICD-10-CM | POA: Diagnosis not present

## 2015-12-09 DIAGNOSIS — N390 Urinary tract infection, site not specified: Secondary | ICD-10-CM | POA: Diagnosis not present

## 2015-12-11 DIAGNOSIS — H4063X1 Glaucoma secondary to drugs, bilateral, mild stage: Secondary | ICD-10-CM | POA: Diagnosis not present

## 2015-12-11 DIAGNOSIS — T8579XA Infection and inflammatory reaction due to other internal prosthetic devices, implants and grafts, initial encounter: Secondary | ICD-10-CM | POA: Diagnosis not present

## 2015-12-11 DIAGNOSIS — D631 Anemia in chronic kidney disease: Secondary | ICD-10-CM | POA: Diagnosis not present

## 2015-12-11 DIAGNOSIS — N390 Urinary tract infection, site not specified: Secondary | ICD-10-CM | POA: Diagnosis not present

## 2015-12-11 DIAGNOSIS — N2581 Secondary hyperparathyroidism of renal origin: Secondary | ICD-10-CM | POA: Diagnosis not present

## 2015-12-11 DIAGNOSIS — N186 End stage renal disease: Secondary | ICD-10-CM | POA: Diagnosis not present

## 2015-12-11 DIAGNOSIS — H40043 Steroid responder, bilateral: Secondary | ICD-10-CM | POA: Diagnosis not present

## 2015-12-14 DIAGNOSIS — N390 Urinary tract infection, site not specified: Secondary | ICD-10-CM | POA: Diagnosis not present

## 2015-12-14 DIAGNOSIS — D631 Anemia in chronic kidney disease: Secondary | ICD-10-CM | POA: Diagnosis not present

## 2015-12-14 DIAGNOSIS — N186 End stage renal disease: Secondary | ICD-10-CM | POA: Diagnosis not present

## 2015-12-14 DIAGNOSIS — N2581 Secondary hyperparathyroidism of renal origin: Secondary | ICD-10-CM | POA: Diagnosis not present

## 2015-12-14 DIAGNOSIS — T8579XA Infection and inflammatory reaction due to other internal prosthetic devices, implants and grafts, initial encounter: Secondary | ICD-10-CM | POA: Diagnosis not present

## 2015-12-16 DIAGNOSIS — D631 Anemia in chronic kidney disease: Secondary | ICD-10-CM | POA: Diagnosis not present

## 2015-12-16 DIAGNOSIS — N186 End stage renal disease: Secondary | ICD-10-CM | POA: Diagnosis not present

## 2015-12-16 DIAGNOSIS — N390 Urinary tract infection, site not specified: Secondary | ICD-10-CM | POA: Diagnosis not present

## 2015-12-16 DIAGNOSIS — N2581 Secondary hyperparathyroidism of renal origin: Secondary | ICD-10-CM | POA: Diagnosis not present

## 2015-12-16 DIAGNOSIS — T8579XA Infection and inflammatory reaction due to other internal prosthetic devices, implants and grafts, initial encounter: Secondary | ICD-10-CM | POA: Diagnosis not present

## 2015-12-18 DIAGNOSIS — T8579XA Infection and inflammatory reaction due to other internal prosthetic devices, implants and grafts, initial encounter: Secondary | ICD-10-CM | POA: Diagnosis not present

## 2015-12-18 DIAGNOSIS — N2581 Secondary hyperparathyroidism of renal origin: Secondary | ICD-10-CM | POA: Diagnosis not present

## 2015-12-18 DIAGNOSIS — D631 Anemia in chronic kidney disease: Secondary | ICD-10-CM | POA: Diagnosis not present

## 2015-12-18 DIAGNOSIS — N186 End stage renal disease: Secondary | ICD-10-CM | POA: Diagnosis not present

## 2015-12-18 DIAGNOSIS — N390 Urinary tract infection, site not specified: Secondary | ICD-10-CM | POA: Diagnosis not present

## 2015-12-21 DIAGNOSIS — D631 Anemia in chronic kidney disease: Secondary | ICD-10-CM | POA: Diagnosis not present

## 2015-12-21 DIAGNOSIS — N186 End stage renal disease: Secondary | ICD-10-CM | POA: Diagnosis not present

## 2015-12-21 DIAGNOSIS — N2581 Secondary hyperparathyroidism of renal origin: Secondary | ICD-10-CM | POA: Diagnosis not present

## 2015-12-23 DIAGNOSIS — N2581 Secondary hyperparathyroidism of renal origin: Secondary | ICD-10-CM | POA: Diagnosis not present

## 2015-12-23 DIAGNOSIS — N186 End stage renal disease: Secondary | ICD-10-CM | POA: Diagnosis not present

## 2015-12-23 DIAGNOSIS — D631 Anemia in chronic kidney disease: Secondary | ICD-10-CM | POA: Diagnosis not present

## 2015-12-25 DIAGNOSIS — N2581 Secondary hyperparathyroidism of renal origin: Secondary | ICD-10-CM | POA: Diagnosis not present

## 2015-12-25 DIAGNOSIS — T86819 Unspecified complication of lung transplant: Secondary | ICD-10-CM | POA: Diagnosis not present

## 2015-12-25 DIAGNOSIS — N390 Urinary tract infection, site not specified: Secondary | ICD-10-CM | POA: Diagnosis not present

## 2015-12-25 DIAGNOSIS — T8579XA Infection and inflammatory reaction due to other internal prosthetic devices, implants and grafts, initial encounter: Secondary | ICD-10-CM | POA: Diagnosis not present

## 2015-12-25 DIAGNOSIS — Z942 Lung transplant status: Secondary | ICD-10-CM | POA: Diagnosis not present

## 2015-12-25 DIAGNOSIS — D631 Anemia in chronic kidney disease: Secondary | ICD-10-CM | POA: Diagnosis not present

## 2015-12-25 DIAGNOSIS — D899 Disorder involving the immune mechanism, unspecified: Secondary | ICD-10-CM | POA: Diagnosis not present

## 2015-12-25 DIAGNOSIS — Z79899 Other long term (current) drug therapy: Secondary | ICD-10-CM | POA: Diagnosis not present

## 2015-12-25 DIAGNOSIS — N186 End stage renal disease: Secondary | ICD-10-CM | POA: Diagnosis not present

## 2015-12-28 ENCOUNTER — Emergency Department (HOSPITAL_COMMUNITY)
Admission: EM | Admit: 2015-12-28 | Discharge: 2015-12-29 | Disposition: A | Discharge status: Home / Self Care | Payer: Medicare Other | Source: Home / Self Care | Attending: Emergency Medicine | Admitting: Emergency Medicine

## 2015-12-28 ENCOUNTER — Emergency Department (HOSPITAL_COMMUNITY): Payer: Medicare Other

## 2015-12-28 ENCOUNTER — Encounter (HOSPITAL_COMMUNITY): Payer: Self-pay | Admitting: Emergency Medicine

## 2015-12-28 DIAGNOSIS — I120 Hypertensive chronic kidney disease with stage 5 chronic kidney disease or end stage renal disease: Secondary | ICD-10-CM | POA: Insufficient documentation

## 2015-12-28 DIAGNOSIS — R05 Cough: Secondary | ICD-10-CM | POA: Diagnosis not present

## 2015-12-28 DIAGNOSIS — J9600 Acute respiratory failure, unspecified whether with hypoxia or hypercapnia: Secondary | ICD-10-CM | POA: Diagnosis not present

## 2015-12-28 DIAGNOSIS — Z87891 Personal history of nicotine dependence: Secondary | ICD-10-CM

## 2015-12-28 DIAGNOSIS — N186 End stage renal disease: Secondary | ICD-10-CM

## 2015-12-28 DIAGNOSIS — R112 Nausea with vomiting, unspecified: Secondary | ICD-10-CM | POA: Insufficient documentation

## 2015-12-28 DIAGNOSIS — R531 Weakness: Secondary | ICD-10-CM | POA: Diagnosis not present

## 2015-12-28 DIAGNOSIS — Z942 Lung transplant status: Secondary | ICD-10-CM | POA: Diagnosis not present

## 2015-12-28 DIAGNOSIS — Z7901 Long term (current) use of anticoagulants: Secondary | ICD-10-CM

## 2015-12-28 DIAGNOSIS — E119 Type 2 diabetes mellitus without complications: Secondary | ICD-10-CM

## 2015-12-28 DIAGNOSIS — R509 Fever, unspecified: Secondary | ICD-10-CM | POA: Diagnosis not present

## 2015-12-28 DIAGNOSIS — Z992 Dependence on renal dialysis: Secondary | ICD-10-CM

## 2015-12-28 DIAGNOSIS — Z7982 Long term (current) use of aspirin: Secondary | ICD-10-CM

## 2015-12-28 DIAGNOSIS — A419 Sepsis, unspecified organism: Secondary | ICD-10-CM | POA: Diagnosis not present

## 2015-12-28 DIAGNOSIS — Z794 Long term (current) use of insulin: Secondary | ICD-10-CM

## 2015-12-28 DIAGNOSIS — Z86018 Personal history of other benign neoplasm: Secondary | ICD-10-CM | POA: Insufficient documentation

## 2015-12-28 LAB — COMPREHENSIVE METABOLIC PANEL
ALK PHOS: 123 U/L (ref 38–126)
ALT: 41 U/L (ref 17–63)
ANION GAP: 15 (ref 5–15)
AST: 60 U/L — AB (ref 15–41)
Albumin: 3.4 g/dL — ABNORMAL LOW (ref 3.5–5.0)
BILIRUBIN TOTAL: 1.1 mg/dL (ref 0.3–1.2)
BUN: 66 mg/dL — AB (ref 6–20)
CHLORIDE: 97 mmol/L — AB (ref 101–111)
CO2: 24 mmol/L (ref 22–32)
Calcium: 9.2 mg/dL (ref 8.9–10.3)
Creatinine, Ser: 8.07 mg/dL — ABNORMAL HIGH (ref 0.61–1.24)
GFR calc Af Amer: 7 mL/min — ABNORMAL LOW (ref >60)
GFR, EST NON AFRICAN AMERICAN: 6 mL/min — AB (ref >60)
GLUCOSE: 149 mg/dL — AB (ref 65–99)
POTASSIUM: 4.6 mmol/L (ref 3.5–5.1)
Sodium: 136 mmol/L (ref 135–145)
Total Protein: 6.5 g/dL (ref 6.5–8.1)

## 2015-12-28 LAB — CBC WITH DIFFERENTIAL/PLATELET
BASOS ABS: 0 10*3/uL (ref 0.0–0.1)
Basophils Relative: 1 %
EOS ABS: 0 10*3/uL (ref 0.0–0.7)
Eosinophils Relative: 1 %
HCT: 37.8 % — ABNORMAL LOW (ref 39.0–52.0)
HEMOGLOBIN: 12.5 g/dL — AB (ref 13.0–17.0)
LYMPHS ABS: 0.7 10*3/uL (ref 0.7–4.0)
Lymphocytes Relative: 14 %
MCH: 34.8 pg — AB (ref 26.0–34.0)
MCHC: 33.1 g/dL (ref 30.0–36.0)
MCV: 105.3 fL — ABNORMAL HIGH (ref 78.0–100.0)
Monocytes Absolute: 0.4 10*3/uL (ref 0.1–1.0)
Monocytes Relative: 8 %
NEUTROS PCT: 76 %
Neutro Abs: 3.8 10*3/uL (ref 1.7–7.7)
PLATELETS: 204 10*3/uL (ref 150–400)
RBC: 3.59 MIL/uL — AB (ref 4.22–5.81)
RDW: 17.8 % — ABNORMAL HIGH (ref 11.5–15.5)
WBC: 5 10*3/uL (ref 4.0–10.5)

## 2015-12-28 LAB — I-STAT BETA HCG BLOOD, ED (MC, WL, AP ONLY): HCG, QUANTITATIVE: 6.4 m[IU]/mL — AB (ref <5)

## 2015-12-28 LAB — I-STAT CG4 LACTIC ACID, ED: LACTIC ACID, VENOUS: 1.3 mmol/L (ref 0.5–1.9)

## 2015-12-28 MED ORDER — SODIUM CHLORIDE 0.9 % IV BOLUS (SEPSIS)
500.0000 mL | Freq: Once | INTRAVENOUS | Status: AC
  Administered 2015-12-29: 500 mL via INTRAVENOUS

## 2015-12-28 MED ORDER — ONDANSETRON HCL 4 MG/2ML IJ SOLN
4.0000 mg | Freq: Once | INTRAMUSCULAR | Status: AC
  Administered 2015-12-29: 4 mg via INTRAVENOUS
  Filled 2015-12-28: qty 2

## 2015-12-28 NOTE — ED Triage Notes (Addendum)
Lung transplant pt in 20 16 staRTED ON Friday WITH CHILLS   NAUSEA VOMITING  Shaking   H/a  dialyis is M W F missed today because he felt bad

## 2015-12-28 NOTE — ED Provider Notes (Signed)
[facility] DEPT Provider Note   CSN: [id number] Arrival date & time: 12/28/15  1751  First Provider Contact:  11:14 PM      By signing my name below, I, [name], attest that this documentation has been prepared under the direction and in the presence of [name], MD. Electronically Signed: Altamease [name], ED Scribe. 12/28/15. 11:47 PM  History   Chief Complaint Chief Complaint  Patient presents with  . Chills  . Weakness  . Vomiting    HPI  The history is provided by the patient. No language interpreter was used.   [name] is a 65 y.o. male with past medical history of ESRD on M/W/F dialysis and lung transplant  who presents to the Emergency Department complaining of nausea and vomiting with onset 3 days ago. He has had 5 episodes of emesis today. Associated symptoms include chills,  generalized weakness and muscle aches. Pt states that he missed dialysis today because he felt poorly but has has an appointment in just over 12 hours. Pt denies fever,sweating, diarrhea, and abdominal pain.   Past Medical History:  Diagnosis Date  . Aortic valve disorders   . Benign neoplasm of colon   . Degeneration of intervertebral disc, site unspecified   . Diabetes mellitus without complication ([place])   . 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 Esophageal reflux   . Obstructive sleep apnea (adult) (pediatric)   . Osteoarthrosis, unspecified whether generalized or localized, unspecified site   . Other and unspecified hyperlipidemia   . Pneumonia    interstitial pneumonia  . Renal disorder   . Transplanted, lung ([place])   . Unspecified essential hypertension     Patient Active Problem List   Diagnosis Date Noted  . Acute respiratory failure with hypoxemia ([place]) 04/18/2015  . Septic shock ([place]) 04/18/2015  . Acute encephalopathy 04/18/2015  . Acute respiratory failure ([place]) 04/18/2015  . Cardiac arrest ([place])   . HCAP  (healthcare-associated pneumonia)   . Elevated rheumatoid factor 05/09/2014  . Unspecified essential hypertension 05/09/2014  . ILD (interstitial lung disease) ([place]) 05/09/2014  . Obstructive apnea 05/09/2014  . Lung nodule, solitary 04/18/2014  . Awaiting organ transplant 04/18/2014  . Acute on chronic respiratory failure with hypoxia ([place]) 08/15/2013  . Edema 07/05/2013  . Chronic respiratory failure ([place]) 03/03/2013  . Diabetes mellitus ([place]) 03/03/2013  . Acute sinusitis 05/04/2011  . Cough 11/10/2010  . 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 Obstructive sleep apnea 10/09/2008  . ACID REFLUX DISEASE 10/09/2008  . HIATAL HERNIA 10/09/2008  . OSTEOARTHRITIS 10/09/2008    Past Surgical History:  Procedure Laterality Date  . LUNG BIOPSY  2010  . LUNG TRANSPLANT, SINGLE Right        Home Medications    Prior to Admission medications   Medication Sig Start Date End Date Taking? Authorizing Provider  acetaminophen (TYLENOL) 160 MG/5ML solution Place 20.3 mLs (650 mg total) into feeding tube every 6 (six) hours as needed for mild pain, headache or fever. 04/19/15   Erick Colace, NP  acetaminophen (TYLENOL) 500 MG tablet Take 500 mg by mouth every 6 (six) hours as needed for fever or headache.    Historical Provider, MD  anidulafungin 100 mg in sodium chloride 0.9 % 100 mL Inject 100 mg into the vein daily. Patient not taking: Reported on 08/21/2015 04/19/15   Erick Colace, NP  aspirin 81 MG tablet Take 81 mg by mouth daily.  Historical Provider, MD  azaTHIOprine (IMURAN) 50 MG tablet take 3 tablets by mouth once daily Patient taking differently: take 1 tablet by mouth once daily 11/26/13   Kathee Delton, MD  azithromycin 500 mg in dextrose 5 % 250 mL Inject 500 mg into the vein daily. Patient not taking: Reported on 08/21/2015 04/18/15   Erick Colace, NP  chlorhexidine gluconate (PERIDEX) 0.12 % solution 15 mLs by Mouth Rinse route 2 (two) times  daily. Patient not taking: Reported on 09/01/2015 04/19/15   Erick Colace, NP  cycloSPORINE modified (NEORAL) 100 MG capsule Take 1 capsule by mouth at bedtime.  03/09/15 04/08/16  Historical Provider, MD  cycloSPORINE modified (NEORAL) 25 MG capsule Take 75 mg by mouth every morning.    Historical Provider, MD  fentaNYL 2,500 mcg in sodium chloride 0.9 % 200 mL Titrate as needed Patient not taking: Reported on 09/01/2015 04/18/15   Erick Colace, NP  fluticasone [facility]) 50 MCG/ACT nasal spray Place 2 sprays into the nose daily. Patient taking differently: Place 2 sprays into the nose daily as needed for allergies.  03/05/13   Wenda Low, MD  heparin 5000 UNIT/ML injection Inject 1 mL (5,000 Units total) into the skin every 8 (eight) hours. Patient not taking: Reported on 09/01/2015 04/18/15   Erick Colace, NP  insulin aspart (NOVOLOG) 100 UNIT/ML injection Inject 2-6 Units into the skin every 4 (four) hours. 04/18/15   Erick Colace, NP  midazolam (VERSED) 2 MG/2ML SOLN injection Inject 1-2 mLs (1-2 mg total) into the vein every 2 (two) hours as needed for agitation. Patient not taking: Reported on 08/21/2015 04/18/15   Erick Colace, NP  Multiple Vitamin (MULTIVITAMIN WITH MINERALS) TABS tablet Take 1 tablet by mouth daily.    Historical Provider, MD  norepinephrine 16 mg in dextrose 5 % 250 mL Titrate for MAP > 65 Patient not taking: Reported on 08/21/2015 04/19/15   Erick Colace, NP  norepinephrine 4 mg in dextrose 5 % 250 mL Inject 2-50 mcg/min into the vein continuous. Patient not taking: Reported on 08/21/2015 04/18/15   Erick Colace, NP  omeprazole (PRILOSEC) 20 MG capsule Take 20 mg by mouth daily.    Historical Provider, MD  pantoprazole (PROTONIX) 40 MG injection Inject 40 mg into the vein daily. Patient not taking: Reported on 08/21/2015 04/18/15   Erick Colace, NP  piperacillin-tazobactam (ZOSYN) 2-0.25 GM/50ML IVPB Inject 50 mLs (2.25 g total) into the vein every 12  (twelve) hours. Patient not taking: Reported on 08/21/2015 04/18/15   Erick Colace, NP  piperacillin-tazobactam (ZOSYN) 3-0.375 GM/50ML IVPB Inject 50 mLs (3.375 g total) into the vein every 6 (six) hours. Patient not taking: Reported on 08/21/2015 04/19/15   Erick Colace, NP  pravastatin (PRAVACHOL) 20 MG tablet Take 20 mg by mouth every evening.     Historical Provider, MD  predniSONE (DELTASONE) 5 MG tablet Take 3.5 tablets daily as directed Patient taking differently: Take 5 mg by mouth daily with breakfast.  12/02/13   Kathee Delton, MD  sertraline (ZOLOFT) 50 MG tablet Take 50 mg by mouth daily.    Historical Provider, MD  sodium chloride 0.9 % infusion kvo ivfs Patient not taking: Reported on 08/21/2015 04/19/15   Erick Colace, NP  sodium chloride 0.9 % Inject 1,000 mLs into the vein every hour. Patient not taking: Reported on 08/21/2015 04/18/15   Erick Colace, NP  sulfamethoxazole-trimethoprim (BACTRIM DS,SEPTRA DS) 800-160 MG per  tablet Take 1 tablet by mouth daily. Patient taking differently: Take 1 tablet by mouth daily. Take one tablet by mouth on mondays, wednesdays and fridays 05/26/14   Kathee Delton, MD  valGANciclovir (VALCYTE) 450 MG tablet Take 1 tablet by mouth See admin instructions. Patient takes one tablet every Monday and Friday after dialysis 03/26/15   Historical Provider, MD  Vancomycin (VANCOCIN) 750 MG/150ML SOLN Inject 150 mLs (750 mg total) into the vein daily. Patient not taking: Reported on 08/21/2015 04/20/15   Erick Colace, NP  vasopressin 40 Units in sodium chloride 0.9 % 250 mL Inject 0.03 Units/min into the vein continuous. Patient not taking: Reported on 08/21/2015 04/18/15   Erick Colace, NP    Family History Family History  Problem Relation Age of Onset  . Pancreatic cancer Brother   . Heart disease Father     Social History Social History  Substance Use Topics  . Smoking status: Former Smoker    Packs/day: 0.30    Years: 20.00      Types: Cigarettes    Quit date: 06/06/2002  . Smokeless tobacco: Never Used  . Alcohol use No     Allergies   Levofloxacin and Nsaids   Review of Systems Review of Systems  Constitutional: Positive for chills. Negative for diaphoresis and fever.  Cardiovascular: Negative for chest pain and leg swelling.  Gastrointestinal: Positive for nausea and vomiting. Negative for abdominal pain and diarrhea.  Musculoskeletal: Positive for myalgias.  Neurological: Positive for weakness.  All other systems reviewed and are negative.    Physical Exam Updated Vital Signs BP (!) 198/112 (BP Location: Right Arm)   Pulse 81   Temp 98.4 F (36.9 C) (Oral)   Resp 26   Ht 5' 3.5" (1.613 m)   Wt 124 lb (56.2 kg)   SpO2 95%   BMI 21.62 kg/m   Physical Exam  Constitutional: He is oriented to person, place, and time. He appears well-developed and well-nourished.  HENT:  Head: Normocephalic and atraumatic.  Eyes: EOM are normal. Pupils are equal, round, and reactive to light.  Neck: Normal range of motion. Neck supple. No JVD present.  Cardiovascular: Normal rate, regular rhythm, normal heart sounds and intact distal pulses.   Pulmonary/Chest: Effort normal and breath sounds normal. He has no wheezes. He has no rales. He exhibits no tenderness.  Abdominal: Soft. He exhibits no distension and no mass. There is no tenderness.  Bowel sounds decreased   Musculoskeletal: Normal range of motion. He exhibits no edema.  AV fistula in left upper arm with thrill present No edema   Lymphadenopathy:    He has no cervical adenopathy.  Neurological: He is alert and oriented to person, place, and time. No cranial nerve deficit. He exhibits normal muscle tone. Coordination normal.  Skin: Skin is warm and dry. No rash noted.  Psychiatric: He has a normal mood and affect. His behavior is normal. Judgment and thought content normal.  Nursing note and vitals reviewed.    ED Treatments / Results   Labs (all labs ordered are listed, but only abnormal results are displayed) Labs Reviewed  COMPREHENSIVE METABOLIC PANEL - Abnormal; Notable for the following:       Result Value   Chloride 97 (*)    Glucose, Bld 149 (*)    BUN 66 (*)    Creatinine, Ser 8.07 (*)    Albumin 3.4 (*)    AST 60 (*)    GFR calc non Af Wyvonnia Lora  6 (*)    GFR calc Af Amer 7 (*)    All other components within normal limits  CBC WITH DIFFERENTIAL/PLATELET - Abnormal; Notable for the following:    RBC 3.59 (*)    Hemoglobin 12.5 (*)    HCT 37.8 (*)    MCV 105.3 (*)    MCH 34.8 (*)    RDW 17.8 (*)    All other components within normal limits  I-STAT BETA HCG BLOOD, ED ([facility], [facility], [facility] ONLY) - Abnormal; Notable for the following:    I-stat hCG, quantitative 6.4 (*)    All other components within normal limits  CULTURE, BLOOD (ROUTINE X 2)  CULTURE, BLOOD (ROUTINE X 2)  URINE CULTURE  URINALYSIS, ROUTINE W REFLEX MICROSCOPIC (NOT AT [facility])  I-STAT CG4 LACTIC ACID, ED  I-STAT CG4 LACTIC ACID, ED    EKG  EKG Interpretation None       Radiology Dg Chest 2 View  Result Date: 12/28/2015 CLINICAL DATA:  Fever, nausea, vomiting and chills today. EXAM: CHEST  2 VIEW COMPARISON:  09/02/2015 FINDINGS: The cardiac silhouette, mediastinal and hilar contours are within normal limits and stable. There is moderate tortuosity and calcification of the thoracic aorta. Stable surgical changes related to a right-sided lung transplant. Right basilar pleural thickening and parenchymal scarring changes appears stable. Stable hypoaeration of the left line with fairly significant interstitial lung disease. No superimposed process. IMPRESSION: Surgical changes from a right lung transplant with chronic right basilar scarring and pleural thickening. Hypoaeration of the left lung and chronic interstitial lung disease/ pulmonary fibrosis. Electronically Signed   By: Marijo Sanes M.D.   On: 12/28/2015 19:27   Procedures Procedures  (including critical care time)  Medications Ordered in ED Medications - No data to display   Initial Impression / Assessment and Plan / ED Course  I have reviewed the triage vital signs and the nursing notes.    COORDINATION OF CARE: 11:20 PM Discussed treatment plan which includes lab work, CXR, Zofran, and IVF  with pt at bedside and pt agreed to plan.   Pertinent labs & imaging results that were available during my care of the patient were reviewed by me and considered in my medical decision making (see chart for details).  Clinical Course    65 year old male who is status post lung transplant presents with nausea and vomiting and chills. No red flags to suggest serious illness. WBC is normal. He does not appear overtly dehydrated but is clearly uncomfortable. Old records are reviewed confirming lung transplant in 2016 and he is [facility] at [facility] for that. He is on dialysis every Monday/Wednesday/Friday, but shows no sign of fluid overload and is not showing any evidence of hyperkalemia. He will be given gentle IV hydration and ondansetron for nausea. Once nausea is adequately controlled, he will be discharged so that he can have dialysis tomorrow.  Following above noted treatment, he is feeling much better. He has ondansetron at home, and is advised to take it as needed. He is to go for his dialysis later today , as scheduled.  Final Clinical Impressions(s) / ED Diagnoses   Final diagnoses:  Non-intractable vomiting with nausea, vomiting of unspecified type  End-stage renal disease on hemodialysis [facility])    New Prescriptions New Prescriptions   No medications on file  I personally performed the services described in this documentation, which was scribed in my presence. The recorded information has been reviewed and is accurate.  [name], MD 79/72/82 [id number]

## 2015-12-29 DIAGNOSIS — N2581 Secondary hyperparathyroidism of renal origin: Secondary | ICD-10-CM | POA: Diagnosis not present

## 2015-12-29 DIAGNOSIS — D631 Anemia in chronic kidney disease: Secondary | ICD-10-CM | POA: Diagnosis not present

## 2015-12-29 DIAGNOSIS — N186 End stage renal disease: Secondary | ICD-10-CM | POA: Diagnosis not present

## 2015-12-29 DIAGNOSIS — N390 Urinary tract infection, site not specified: Secondary | ICD-10-CM | POA: Diagnosis not present

## 2015-12-29 DIAGNOSIS — T8579XA Infection and inflammatory reaction due to other internal prosthetic devices, implants and grafts, initial encounter: Secondary | ICD-10-CM | POA: Diagnosis not present

## 2015-12-29 NOTE — Discharge Instructions (Signed)
Go for your dialysis today as scheduled. Take your ondansetron (Zofran) every six hours as needed for nausea.

## 2015-12-29 NOTE — ED Notes (Signed)
Pt was again made aware of need for urine specimen. Family advised that we "probably won't be able to get a urine sample on him since he only urinates about one time a day and he already did today".

## 2015-12-29 NOTE — ED Notes (Signed)
Pt stated he was unable to void

## 2015-12-30 ENCOUNTER — Inpatient Hospital Stay (HOSPITAL_COMMUNITY)
Admission: EM | Admit: 2015-12-30 | Discharge: 2016-01-01 | DRG: 853 | Disposition: A | Discharge status: Other Acute Inpatient Hospital | Payer: Medicare Other | Attending: Pulmonary Disease | Admitting: Pulmonary Disease

## 2015-12-30 ENCOUNTER — Encounter (HOSPITAL_COMMUNITY): Payer: Self-pay

## 2015-12-30 ENCOUNTER — Emergency Department (HOSPITAL_COMMUNITY): Payer: Medicare Other

## 2015-12-30 ENCOUNTER — Inpatient Hospital Stay (HOSPITAL_COMMUNITY): Payer: Medicare Other

## 2015-12-30 DIAGNOSIS — R404 Transient alteration of awareness: Secondary | ICD-10-CM | POA: Diagnosis not present

## 2015-12-30 DIAGNOSIS — D638 Anemia in other chronic diseases classified elsewhere: Secondary | ICD-10-CM | POA: Diagnosis present

## 2015-12-30 DIAGNOSIS — Z87891 Personal history of nicotine dependence: Secondary | ICD-10-CM

## 2015-12-30 DIAGNOSIS — I120 Hypertensive chronic kidney disease with stage 5 chronic kidney disease or end stage renal disease: Secondary | ICD-10-CM | POA: Diagnosis present

## 2015-12-30 DIAGNOSIS — Z9981 Dependence on supplemental oxygen: Secondary | ICD-10-CM | POA: Diagnosis not present

## 2015-12-30 DIAGNOSIS — E785 Hyperlipidemia, unspecified: Secondary | ICD-10-CM | POA: Diagnosis present

## 2015-12-30 DIAGNOSIS — B488 Other specified mycoses: Secondary | ICD-10-CM | POA: Diagnosis not present

## 2015-12-30 DIAGNOSIS — G9341 Metabolic encephalopathy: Secondary | ICD-10-CM | POA: Diagnosis present

## 2015-12-30 DIAGNOSIS — K219 Gastro-esophageal reflux disease without esophagitis: Secondary | ICD-10-CM | POA: Diagnosis present

## 2015-12-30 DIAGNOSIS — J84112 Idiopathic pulmonary fibrosis: Secondary | ICD-10-CM | POA: Diagnosis present

## 2015-12-30 DIAGNOSIS — Z01818 Encounter for other preprocedural examination: Secondary | ICD-10-CM

## 2015-12-30 DIAGNOSIS — J189 Pneumonia, unspecified organism: Secondary | ICD-10-CM | POA: Diagnosis not present

## 2015-12-30 DIAGNOSIS — A419 Sepsis, unspecified organism: Secondary | ICD-10-CM | POA: Diagnosis not present

## 2015-12-30 DIAGNOSIS — J9621 Acute and chronic respiratory failure with hypoxia: Secondary | ICD-10-CM | POA: Diagnosis present

## 2015-12-30 DIAGNOSIS — R531 Weakness: Secondary | ICD-10-CM | POA: Diagnosis not present

## 2015-12-30 DIAGNOSIS — I10 Essential (primary) hypertension: Secondary | ICD-10-CM | POA: Diagnosis present

## 2015-12-30 DIAGNOSIS — Y95 Nosocomial condition: Secondary | ICD-10-CM | POA: Diagnosis not present

## 2015-12-30 DIAGNOSIS — I950 Idiopathic hypotension: Secondary | ICD-10-CM | POA: Diagnosis not present

## 2015-12-30 DIAGNOSIS — Z8249 Family history of ischemic heart disease and other diseases of the circulatory system: Secondary | ICD-10-CM | POA: Diagnosis not present

## 2015-12-30 DIAGNOSIS — J9600 Acute respiratory failure, unspecified whether with hypoxia or hypercapnia: Secondary | ICD-10-CM

## 2015-12-30 DIAGNOSIS — Z4682 Encounter for fitting and adjustment of non-vascular catheter: Secondary | ICD-10-CM | POA: Diagnosis not present

## 2015-12-30 DIAGNOSIS — Z794 Long term (current) use of insulin: Secondary | ICD-10-CM | POA: Diagnosis not present

## 2015-12-30 DIAGNOSIS — N185 Chronic kidney disease, stage 5: Secondary | ICD-10-CM | POA: Diagnosis not present

## 2015-12-30 DIAGNOSIS — Z886 Allergy status to analgesic agent status: Secondary | ICD-10-CM

## 2015-12-30 DIAGNOSIS — N2581 Secondary hyperparathyroidism of renal origin: Secondary | ICD-10-CM | POA: Diagnosis present

## 2015-12-30 DIAGNOSIS — J9601 Acute respiratory failure with hypoxia: Secondary | ICD-10-CM | POA: Diagnosis not present

## 2015-12-30 DIAGNOSIS — N186 End stage renal disease: Secondary | ICD-10-CM | POA: Diagnosis not present

## 2015-12-30 DIAGNOSIS — G4733 Obstructive sleep apnea (adult) (pediatric): Secondary | ICD-10-CM | POA: Diagnosis present

## 2015-12-30 DIAGNOSIS — E1022 Type 1 diabetes mellitus with diabetic chronic kidney disease: Secondary | ICD-10-CM | POA: Diagnosis present

## 2015-12-30 DIAGNOSIS — Z7982 Long term (current) use of aspirin: Secondary | ICD-10-CM | POA: Diagnosis not present

## 2015-12-30 DIAGNOSIS — Z7952 Long term (current) use of systemic steroids: Secondary | ICD-10-CM

## 2015-12-30 DIAGNOSIS — T86812 Lung transplant infection: Secondary | ICD-10-CM | POA: Diagnosis not present

## 2015-12-30 DIAGNOSIS — E10649 Type 1 diabetes mellitus with hypoglycemia without coma: Secondary | ICD-10-CM | POA: Diagnosis present

## 2015-12-30 DIAGNOSIS — I359 Nonrheumatic aortic valve disorder, unspecified: Secondary | ICD-10-CM | POA: Diagnosis present

## 2015-12-30 DIAGNOSIS — R0600 Dyspnea, unspecified: Secondary | ICD-10-CM

## 2015-12-30 DIAGNOSIS — J8410 Pulmonary fibrosis, unspecified: Secondary | ICD-10-CM | POA: Diagnosis present

## 2015-12-30 DIAGNOSIS — J849 Interstitial pulmonary disease, unspecified: Secondary | ICD-10-CM | POA: Diagnosis not present

## 2015-12-30 DIAGNOSIS — Z942 Lung transplant status: Secondary | ICD-10-CM | POA: Diagnosis not present

## 2015-12-30 DIAGNOSIS — Z992 Dependence on renal dialysis: Secondary | ICD-10-CM | POA: Diagnosis not present

## 2015-12-30 DIAGNOSIS — Z79899 Other long term (current) drug therapy: Secondary | ICD-10-CM

## 2015-12-30 DIAGNOSIS — B259 Cytomegaloviral disease, unspecified: Secondary | ICD-10-CM | POA: Diagnosis not present

## 2015-12-30 DIAGNOSIS — J80 Acute respiratory distress syndrome: Secondary | ICD-10-CM | POA: Diagnosis not present

## 2015-12-30 DIAGNOSIS — Z4659 Encounter for fitting and adjustment of other gastrointestinal appliance and device: Secondary | ICD-10-CM

## 2015-12-30 DIAGNOSIS — Z881 Allergy status to other antibiotic agents status: Secondary | ICD-10-CM

## 2015-12-30 DIAGNOSIS — E118 Type 2 diabetes mellitus with unspecified complications: Secondary | ICD-10-CM | POA: Diagnosis not present

## 2015-12-30 DIAGNOSIS — N170 Acute kidney failure with tubular necrosis: Secondary | ICD-10-CM | POA: Diagnosis not present

## 2015-12-30 DIAGNOSIS — R079 Chest pain, unspecified: Secondary | ICD-10-CM | POA: Diagnosis not present

## 2015-12-30 DIAGNOSIS — N189 Chronic kidney disease, unspecified: Secondary | ICD-10-CM

## 2015-12-30 DIAGNOSIS — R05 Cough: Secondary | ICD-10-CM | POA: Diagnosis not present

## 2015-12-30 HISTORY — DX: End stage renal disease: N18.6

## 2015-12-30 HISTORY — DX: Essential (primary) hypertension: I10

## 2015-12-30 HISTORY — DX: Pulmonary fibrosis, unspecified: J84.10

## 2015-12-30 HISTORY — DX: Respiratory failure, unspecified with hypoxia: J96.91

## 2015-12-30 HISTORY — DX: Dependence on renal dialysis: Z99.2

## 2015-12-30 HISTORY — DX: Reserved for inherently not codable concepts without codable children: IMO0001

## 2015-12-30 LAB — I-STAT CG4 LACTIC ACID, ED
Lactic Acid, Venous: 2.09 mmol/L (ref 0.5–1.9)
Lactic Acid, Venous: 1.67 mmol/L (ref 0.5–1.9)

## 2015-12-30 LAB — CREATININE, SERUM
CREATININE: 5.57 mg/dL — AB (ref 0.61–1.24)
GFR, EST AFRICAN AMERICAN: 11 mL/min — AB (ref >60)
GFR, EST NON AFRICAN AMERICAN: 10 mL/min — AB (ref >60)

## 2015-12-30 LAB — CBC WITH DIFFERENTIAL/PLATELET
Basophils Absolute: 0.1 10*3/uL (ref 0.0–0.1)
Basophils Relative: 1 %
Eosinophils Absolute: 0.1 10*3/uL (ref 0.0–0.7)
Eosinophils Relative: 2 %
HCT: 39.6 % (ref 39.0–52.0)
Hemoglobin: 12.8 g/dL — ABNORMAL LOW (ref 13.0–17.0)
Lymphocytes Relative: 11 %
Lymphs Abs: 0.7 10*3/uL (ref 0.7–4.0)
MCH: 34.7 pg — ABNORMAL HIGH (ref 26.0–34.0)
MCHC: 32.3 g/dL (ref 30.0–36.0)
MCV: 107.3 fL — ABNORMAL HIGH (ref 78.0–100.0)
Monocytes Absolute: 0.5 10*3/uL (ref 0.1–1.0)
Monocytes Relative: 9 %
Neutro Abs: 4.6 10*3/uL (ref 1.7–7.7)
Neutrophils Relative %: 77 %
Platelets: 134 10*3/uL — ABNORMAL LOW (ref 150–400)
RBC: 3.69 MIL/uL — ABNORMAL LOW (ref 4.22–5.81)
RDW: 17.7 % — ABNORMAL HIGH (ref 11.5–15.5)
WBC: 6 10*3/uL (ref 4.0–10.5)

## 2015-12-30 LAB — COMPREHENSIVE METABOLIC PANEL WITH GFR
ALT: 31 U/L (ref 17–63)
AST: 51 U/L — ABNORMAL HIGH (ref 15–41)
Albumin: 3.1 g/dL — ABNORMAL LOW (ref 3.5–5.0)
Alkaline Phosphatase: 116 U/L (ref 38–126)
Anion gap: 16 — ABNORMAL HIGH (ref 5–15)
BUN: 32 mg/dL — ABNORMAL HIGH (ref 6–20)
CO2: 22 mmol/L (ref 22–32)
Calcium: 8.2 mg/dL — ABNORMAL LOW (ref 8.9–10.3)
Chloride: 98 mmol/L — ABNORMAL LOW (ref 101–111)
Creatinine, Ser: 5.19 mg/dL — ABNORMAL HIGH (ref 0.61–1.24)
GFR calc Af Amer: 12 mL/min — ABNORMAL LOW
GFR calc non Af Amer: 10 mL/min — ABNORMAL LOW
Glucose, Bld: 74 mg/dL (ref 65–99)
Potassium: 5 mmol/L (ref 3.5–5.1)
Sodium: 136 mmol/L (ref 135–145)
Total Bilirubin: 1.3 mg/dL — ABNORMAL HIGH (ref 0.3–1.2)
Total Protein: 6 g/dL — ABNORMAL LOW (ref 6.5–8.1)

## 2015-12-30 LAB — BLOOD CULTURE ID PANEL (REFLEXED)
ACINETOBACTER BAUMANNII: NOT DETECTED
CANDIDA ALBICANS: NOT DETECTED
CANDIDA GLABRATA: NOT DETECTED
CANDIDA TROPICALIS: NOT DETECTED
Candida krusei: NOT DETECTED
Candida parapsilosis: NOT DETECTED
Carbapenem resistance: NOT DETECTED
ENTEROBACTER CLOACAE COMPLEX: NOT DETECTED
ENTEROCOCCUS SPECIES: NOT DETECTED
ESCHERICHIA COLI: NOT DETECTED
Enterobacteriaceae species: NOT DETECTED
HAEMOPHILUS INFLUENZAE: NOT DETECTED
Klebsiella oxytoca: NOT DETECTED
Klebsiella pneumoniae: NOT DETECTED
LISTERIA MONOCYTOGENES: NOT DETECTED
METHICILLIN RESISTANCE: NOT DETECTED
NEISSERIA MENINGITIDIS: NOT DETECTED
PROTEUS SPECIES: NOT DETECTED
Pseudomonas aeruginosa: NOT DETECTED
STAPHYLOCOCCUS AUREUS BCID: NOT DETECTED
STREPTOCOCCUS PNEUMONIAE: NOT DETECTED
STREPTOCOCCUS PYOGENES: NOT DETECTED
STREPTOCOCCUS SPECIES: NOT DETECTED
Serratia marcescens: NOT DETECTED
Staphylococcus species: NOT DETECTED
Streptococcus agalactiae: NOT DETECTED
VANCOMYCIN RESISTANCE: NOT DETECTED

## 2015-12-30 LAB — GLUCOSE, CAPILLARY
GLUCOSE-CAPILLARY: 75 mg/dL (ref 65–99)
GLUCOSE-CAPILLARY: 129 mg/dL — AB (ref 65–99)
GLUCOSE-CAPILLARY: 95 mg/dL (ref 65–99)
Glucose-Capillary: 83 mg/dL (ref 65–99)

## 2015-12-30 LAB — I-STAT ARTERIAL BLOOD GAS, ED
Acid-base deficit: 2 mmol/L (ref 0.0–2.0)
Bicarbonate: 22.7 mEq/L (ref 20.0–24.0)
O2 Saturation: 91 %
PCO2 ART: 39.6 mmHg (ref 35.0–45.0)
TCO2: 24 mmol/L (ref 0–100)
pH, Arterial: 7.368 (ref 7.350–7.450)
pO2, Arterial: 65 mmHg — ABNORMAL LOW (ref 80.0–100.0)

## 2015-12-30 LAB — PHOSPHORUS: Phosphorus: 5.5 mg/dL — ABNORMAL HIGH (ref 2.5–4.6)

## 2015-12-30 LAB — PROTIME-INR
INR: 1.04
Prothrombin Time: 13.8 s (ref 11.4–15.2)

## 2015-12-30 MED ORDER — INSULIN ASPART 100 UNIT/ML ~~LOC~~ SOLN: 0.0000 [IU] | Freq: Every day | SUBCUTANEOUS | Status: DC

## 2015-12-30 MED ORDER — PIPERACILLIN-TAZOBACTAM 3.375 G IVPB 30 MIN
3.3750 g | Freq: Once | INTRAVENOUS | Status: AC
  Administered 2015-12-30: 3.375 g via INTRAVENOUS
  Filled 2015-12-30: qty 50

## 2015-12-30 MED ORDER — SODIUM CHLORIDE 0.9% FLUSH: 3.0000 mL | INTRAVENOUS | Status: DC | PRN

## 2015-12-30 MED ORDER — SODIUM CHLORIDE 0.9 % IV SOLN: INTRAVENOUS | Status: DC

## 2015-12-30 MED ORDER — VANCOMYCIN HCL IN DEXTROSE 500-5 MG/100ML-% IV SOLN
INTRAVENOUS | Status: AC
  Filled 2015-12-30: qty 100

## 2015-12-30 MED ORDER — VANCOMYCIN HCL IN DEXTROSE 1-5 GM/200ML-% IV SOLN
1000.0000 mg | Freq: Once | INTRAVENOUS | Status: AC
  Administered 2015-12-30: 1000 mg via INTRAVENOUS
  Filled 2015-12-30: qty 200

## 2015-12-30 MED ORDER — HYDRALAZINE HCL 20 MG/ML IJ SOLN: 5.0000 mg | INTRAMUSCULAR | Status: DC | PRN

## 2015-12-30 MED ORDER — CYCLOSPORINE MODIFIED (NEORAL) 25 MG PO CAPS
75.0000 mg | ORAL_CAPSULE | Freq: Every morning | ORAL | Status: DC
  Administered 2015-12-30 – 2016-01-01 (×3): 75 mg via ORAL
  Filled 2015-12-30 (×3): qty 3

## 2015-12-30 MED ORDER — OXYCODONE-ACETAMINOPHEN 5-325 MG PO TABS
ORAL_TABLET | ORAL | Status: AC
  Filled 2015-12-30: qty 1

## 2015-12-30 MED ORDER — HEPARIN SODIUM (PORCINE) 5000 UNIT/ML IJ SOLN
5000.0000 [IU] | Freq: Three times a day (TID) | INTRAMUSCULAR | Status: DC
  Administered 2015-12-30 – 2016-01-01 (×8): 5000 [IU] via SUBCUTANEOUS
  Filled 2015-12-30 (×8): qty 1

## 2015-12-30 MED ORDER — HEPARIN SODIUM (PORCINE) 1000 UNIT/ML DIALYSIS
1800.0000 [IU] | INTRAMUSCULAR | Status: DC | PRN
  Filled 2015-12-30: qty 2

## 2015-12-30 MED ORDER — DEXTROSE 5 % IV SOLN
2.0000 g | Freq: Once | INTRAVENOUS | Status: AC
  Administered 2015-12-30: 2 g via INTRAVENOUS
  Filled 2015-12-30: qty 2

## 2015-12-30 MED ORDER — DEXTROSE 5 % IV SOLN: 2.0000 g | INTRAVENOUS | Status: DC

## 2015-12-30 MED ORDER — CYCLOSPORINE MODIFIED (NEORAL) 100 MG PO CAPS
100.0000 mg | ORAL_CAPSULE | Freq: Every day | ORAL | Status: DC
  Administered 2015-12-30 – 2015-12-31 (×2): 100 mg via ORAL
  Filled 2015-12-30 (×3): qty 1

## 2015-12-30 MED ORDER — SODIUM CHLORIDE 0.9 % IV SOLN: 250.0000 mL | INTRAVENOUS | Status: DC | PRN

## 2015-12-30 MED ORDER — SODIUM CHLORIDE 0.9 % IV BOLUS (SEPSIS)
1000.0000 mL | Freq: Once | INTRAVENOUS | Status: AC
  Administered 2015-12-30: 1000 mL via INTRAVENOUS

## 2015-12-30 MED ORDER — LIDOCAINE HCL (PF) 1 % IJ SOLN
5.0000 mL | INTRAMUSCULAR | Status: DC | PRN
  Filled 2015-12-30: qty 5

## 2015-12-30 MED ORDER — OXYCODONE-ACETAMINOPHEN 5-325 MG PO TABS
1.0000 | ORAL_TABLET | Freq: Once | ORAL | Status: AC
  Administered 2015-12-30: 1 via ORAL

## 2015-12-30 MED ORDER — DEXTROSE 5 % IV SOLN
2.0000 g | INTRAVENOUS | Status: DC
  Administered 2015-12-30: 2 g via INTRAVENOUS
  Filled 2015-12-30 (×2): qty 2

## 2015-12-30 MED ORDER — FAMOTIDINE IN NACL 20-0.9 MG/50ML-% IV SOLN
20.0000 mg | INTRAVENOUS | Status: DC
  Administered 2015-12-31: 20 mg via INTRAVENOUS
  Filled 2015-12-30: qty 50

## 2015-12-30 MED ORDER — SODIUM CHLORIDE 0.9 % IV SOLN: 100.0000 mL | INTRAVENOUS | Status: DC | PRN

## 2015-12-30 MED ORDER — ACETAMINOPHEN 325 MG PO TABS
650.0000 mg | ORAL_TABLET | Freq: Four times a day (QID) | ORAL | Status: DC | PRN
Start: 2015-12-30 — End: 2016-01-02
  Administered 2015-12-30 – 2016-01-01 (×3): 650 mg via ORAL
  Filled 2015-12-30: qty 2

## 2015-12-30 MED ORDER — INSULIN ASPART 100 UNIT/ML ~~LOC~~ SOLN: 0.0000 [IU] | Freq: Three times a day (TID) | SUBCUTANEOUS | Status: DC

## 2015-12-30 MED ORDER — LIDOCAINE-PRILOCAINE 2.5-2.5 % EX CREA
1.0000 "application " | TOPICAL_CREAM | CUTANEOUS | Status: DC | PRN
  Filled 2015-12-30: qty 5

## 2015-12-30 MED ORDER — HEPARIN SODIUM (PORCINE) 1000 UNIT/ML DIALYSIS
1000.0000 [IU] | INTRAMUSCULAR | Status: DC | PRN
  Filled 2015-12-30: qty 1

## 2015-12-30 MED ORDER — ONDANSETRON HCL 4 MG PO TABS: 4.0000 mg | ORAL_TABLET | Freq: Four times a day (QID) | ORAL | Status: DC | PRN

## 2015-12-30 MED ORDER — SODIUM CHLORIDE 0.9% FLUSH
3.0000 mL | Freq: Two times a day (BID) | INTRAVENOUS | Status: DC
  Administered 2015-12-30 – 2016-01-01 (×5): 3 mL via INTRAVENOUS

## 2015-12-30 MED ORDER — FAMOTIDINE IN NACL 20-0.9 MG/50ML-% IV SOLN
20.0000 mg | Freq: Two times a day (BID) | INTRAVENOUS | Status: DC
  Administered 2015-12-30: 20 mg via INTRAVENOUS
  Filled 2015-12-30 (×2): qty 50

## 2015-12-30 MED ORDER — ONDANSETRON HCL 4 MG/2ML IJ SOLN
4.0000 mg | Freq: Four times a day (QID) | INTRAMUSCULAR | Status: DC | PRN
  Administered 2015-12-30: 4 mg via INTRAVENOUS
  Filled 2015-12-30: qty 2

## 2015-12-30 MED ORDER — SODIUM CHLORIDE 0.9% FLUSH: 3.0000 mL | Freq: Two times a day (BID) | INTRAVENOUS | Status: DC

## 2015-12-30 MED ORDER — ACETAMINOPHEN 325 MG PO TABS
ORAL_TABLET | ORAL | Status: AC
  Filled 2015-12-30: qty 2

## 2015-12-30 MED ORDER — VANCOMYCIN HCL 500 MG IV SOLR
500.0000 mg | INTRAVENOUS | Status: DC
  Filled 2015-12-30: qty 500

## 2015-12-30 MED ORDER — ACETAMINOPHEN 650 MG RE SUPP: 650.0000 mg | Freq: Four times a day (QID) | RECTAL | Status: DC | PRN

## 2015-12-30 MED ORDER — ALTEPLASE 2 MG IJ SOLR: 2.0000 mg | Freq: Once | INTRAMUSCULAR | Status: DC | PRN

## 2015-12-30 MED ORDER — PENTAFLUOROPROP-TETRAFLUOROETH EX AERO: 1.0000 "application " | INHALATION_SPRAY | CUTANEOUS | Status: DC | PRN

## 2015-12-30 MED ORDER — VANCOMYCIN HCL 500 MG IV SOLR: 500.0000 mg | INTRAVENOUS | Status: DC

## 2015-12-30 MED ORDER — VANCOMYCIN HCL 500 MG IV SOLR
500.0000 mg | INTRAVENOUS | Status: DC
  Administered 2015-12-30: 500 mg via INTRAVENOUS
  Filled 2015-12-30: qty 500

## 2015-12-30 MED ORDER — PIPERACILLIN-TAZOBACTAM IN DEX 2-0.25 GM/50ML IV SOLN
2.2500 g | Freq: Three times a day (TID) | INTRAVENOUS | Status: DC
  Filled 2015-12-30 (×2): qty 50

## 2015-12-30 NOTE — Care Management Note (Signed)
Case Management Note  Patient Details  Name: [name] MRN: [id number] Date of Birth: 1950/09/01  Subjective/Objective:  65 y.o. M admitted 12/30/2015 for fever and chills. Pt is s/p Lung Transplant 2016. BCx2. Hx DM2 and ESRD. Primarily managed at [facility] but diverted to [facility] as census at [facility]. Receiving IV Vanc and Zosyn.                   Action/Plan: Awaiting Transfer to [facility]. Lives at home with Wife.    Expected Discharge Date:                  Expected Discharge Plan:     In-House Referral:     Discharge planning Services  CM Consult  Post Acute Care Choice:    Choice offered to:     DME Arranged:    DME Agency:     HH Arranged:    HH Agency:     Status of Service:     If discussed at [employer], dates discussed:    Additional Comments:  Delrae Sawyers, RN 12/30/2015, 2:45 PM

## 2015-12-30 NOTE — ED Triage Notes (Signed)
Patient comes from home after having continuous weakness.  Patient had dialysis on 12-29-15.  COMPLETED ALL OF IT.  Patient says he's fighting a fungal infection in the right lung.  Patient A&Ox4

## 2015-12-30 NOTE — Progress Notes (Signed)
Patient trasfered from ED to [phone] via stretcher; alert and oriented x 4; no complaints of pain; IV saline locked in RAC and ; skin intact. Orient patient to room and unit;  gave patient care guide; instructed how to use the call bell and  fall risk precautions. Will continue to monitor the patient.

## 2015-12-30 NOTE — Procedures (Signed)
  I was present at this dialysis session, have reviewed the session itself and made  appropriate changes Kelly Splinter MD [place] pager [phone]    cell [phone] 12/30/2015, 3:34 PM

## 2015-12-30 NOTE — ED Notes (Signed)
Call pt's wife for updates/questions. Lovett Coffin [phone]

## 2015-12-30 NOTE — H&P (Addendum)
History and Physical    [name] QAS:[id number] DOB: Jan 06, 1951 DOA: 12/30/2015  PCP: Wenda Low, MD Patient coming from: *home  Chief Complaint: sob  HPI: [name] is a 65 y.o. male with medical history significant of DM, ESRD on dialysis, lung transplant, HTN, presenting w/ vague complaints. Level V caveat applies as patient is very sleepy and speaks in short sentences and only able to give brief answers at this point time. Patient quickly falls asleep during initial exam and is minimally participatory. Patient endorses symptoms of weakness and overall feeling tired. This started on Friday. Symptoms are fairly constant and getting worse. Only other complaint is intermittent right-sided headache. Last dialysis was on 12/28/2015. Denies fevers, cough, shortness of breath above baseline, nausea, vomiting, abdominal pain, dysuria, frequency, rash, neck stiffness, chest pain, palpitations.   ED Course: At time of initial presentation patient had initial lab work and was labeled as code sepsis. Sepsis protocol initiated patient was started on vancomycin and Zosyn was given a 1 L normal saline bolus. Attempts are made for admission to [place] as this is where patient had his lung transplant receives a predominance of his medical care. [facility] was unable to accept patient at this time due to high census and [facility]  Review of Systems: As per HPI otherwise 10 point review of systems negative.   Ambulatory Status: slow but no restrictions  Past Medical History:  Diagnosis Date  . Aortic valve disorders   . Benign neoplasm of colon   . Degeneration of intervertebral disc, site unspecified   . Diabetes mellitus without complication ([place])   . 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 Esophageal reflux   . ESRD (end stage renal disease) on dialysis ([place])   . Obstructive sleep apnea (adult) (pediatric)   . Osteoarthrosis, unspecified  whether generalized or localized, unspecified site   . Other and unspecified hyperlipidemia   . Pneumonia    interstitial pneumonia  . Renal disorder   . Transplanted, lung ([place])   . Unspecified essential hypertension     Past Surgical History:  Procedure Laterality Date  . LUNG BIOPSY  2010  . LUNG TRANSPLANT, SINGLE Right     Social History   Social History  . Marital status: Married    Spouse name: N/A  . Number of children: N/A  . Years of education: N/A   Occupational History  . [employer]    Social History Main Topics  . Smoking status: Former Smoker    Packs/day: 0.30    Years: 20.00    Types: Cigarettes    Quit date: 06/06/2002  . Smokeless tobacco: Never Used  . Alcohol use No  . Drug use: No  . Sexual activity: Not on file   Other Topics Concern  . Not on file   Social History Narrative  . No narrative on file    Allergies  Allergen Reactions  . Levofloxacin Other (See Comments)    LOSS OF CONSCIOUSNESS  . Nsaids Other (See Comments)    Patient instructed not to take NSAID's after his lung transplant    Family History  Problem Relation Age of Onset  . Pancreatic cancer Brother   . Heart disease Father     Prior to Admission medications   Medication Sig Start Date End Date Taking? Authorizing Provider  acetaminophen (TYLENOL) 160 MG/5ML solution Place 20.3 mLs (650 mg total) into feeding tube every 6 (six) hours as needed for mild  pain, headache or fever. 04/19/15   Erick Colace, NP  acetaminophen (TYLENOL) 500 MG tablet Take 500 mg by mouth every 6 (six) hours as needed for fever or headache.    Historical Provider, MD  aspirin 81 MG tablet Take 81 mg by mouth daily.     Historical Provider, MD  azaTHIOprine (IMURAN) 50 MG tablet take 3 tablets by mouth once daily Patient taking differently: take 1 tablet by mouth once daily 11/26/13   Kathee Delton, MD  azithromycin 500 mg in dextrose 5 % 250 mL Inject 500 mg into the vein daily. Patient not  taking: Reported on 08/21/2015 04/18/15   Erick Colace, NP  cycloSPORINE modified (NEORAL) 100 MG capsule Take 1 capsule by mouth at bedtime.  03/09/15 04/08/16  Historical Provider, MD  cycloSPORINE modified (NEORAL) 25 MG capsule Take 75 mg by mouth every morning.    Historical Provider, MD  fluticasone (FLONASE) 50 MCG/ACT nasal spray Place 2 sprays into the nose daily. Patient taking differently: Place 2 sprays into the nose daily as needed for allergies.  03/05/13   Wenda Low, MD  insulin aspart (NOVOLOG) 100 UNIT/ML injection Inject 2-6 Units into the skin every 4 (four) hours. 04/18/15   Erick Colace, NP  Multiple Vitamin (MULTIVITAMIN WITH MINERALS) TABS tablet Take 1 tablet by mouth daily.    Historical Provider, MD  omeprazole (PRILOSEC) 20 MG capsule Take 20 mg by mouth daily.    Historical Provider, MD  pravastatin (PRAVACHOL) 20 MG tablet Take 20 mg by mouth every evening.     Historical Provider, MD  predniSONE (DELTASONE) 5 MG tablet Take 3.5 tablets daily as directed Patient taking differently: Take 5 mg by mouth daily with breakfast.  12/02/13   Kathee Delton, MD  sertraline (ZOLOFT) 50 MG tablet Take 50 mg by mouth daily.    Historical Provider, MD  sulfamethoxazole-trimethoprim (BACTRIM DS,SEPTRA DS) 800-160 MG per tablet Take 1 tablet by mouth daily. Patient taking differently: Take 1 tablet by mouth daily. Take one tablet by mouth on mondays, wednesdays and fridays 05/26/14   Kathee Delton, MD  valGANciclovir (VALCYTE) 450 MG tablet Take 1 tablet by mouth See admin instructions. Patient takes one tablet every Monday and Friday after dialysis 03/26/15   Historical Provider, MD    Physical Exam: Vitals:   12/30/15 0630 12/30/15 0642 12/30/15 0645 12/30/15 0700  BP: 155/93  152/88 154/89  Pulse: 86  84 84  Resp: '18  11 20  '$ Temp:  99.8 F (37.7 C)    TempSrc:  Oral    SpO2: 95%  95% 96%  Weight:      Height:          General: Ill-appearing resting in  bed Eyes:  PERRL, EOMI, ENT: Dry mucous membranes, grossly normal hearing, Neck:  no LAD, masses or thyromegaly Cardiovascular:  RRR, III/VI systolic murmur. No LE edema.  Respiratory: Diminished breath sounds in the bases. Speaks in short sentences. Abdomen:  soft, ntnd, NABS Skin:  no rash or induration seen on limited exam Musculoskeletal:  grossly normal tone BUE/BLE, good ROM, no bony abnormality Psychiatric: Participatory in exam. Normal affect. Answers questions appropriately. Neurologic:  CN 2-12 grossly intact, moves all extremities in coordinated fashion, sensation intact  Labs on Admission: I have personally reviewed following labs and imaging studies  CBC:  Recent Labs Lab 12/28/15 1813 12/30/15 0435  WBC 5.0 6.0  NEUTROABS 3.8 4.6  HGB 12.5* 12.8*  HCT 37.8* 39.6  MCV 105.3* 107.3*  PLT 204 263*   Basic Metabolic Panel:  Recent Labs Lab 12/28/15 1813 12/30/15 0435  NA 136 136  K 4.6 5.0  CL 97* 98*  CO2 24 22  GLUCOSE 149* 74  BUN 66* 32*  CREATININE 8.07* 5.19*  CALCIUM 9.2 8.2*   GFR: Estimated Creatinine Clearance: 11.1 mL/min (by C-G formula based on SCr of 5.19 mg/dL). Liver Function Tests:  Recent Labs Lab 12/28/15 1813 12/30/15 0435  AST 60* 51*  ALT 41 31  ALKPHOS 123 116  BILITOT 1.1 1.3*  PROT 6.5 6.0*  ALBUMIN 3.4* 3.1*   No results for input(s): LIPASE, AMYLASE in the last 168 hours. No results for input(s): AMMONIA in the last 168 hours. Coagulation Profile:  Recent Labs Lab 12/30/15 0652  INR 1.04   Cardiac Enzymes: No results for input(s): CKTOTAL, CKMB, CKMBINDEX, TROPONINI in the last 168 hours. BNP (last 3 results) No results for input(s): PROBNP in the last 8760 hours. HbA1C: No results for input(s): HGBA1C in the last 72 hours. CBG: No results for input(s): GLUCAP in the last 168 hours. Lipid Profile: No results for input(s): CHOL, HDL, LDLCALC, TRIG, CHOLHDL, LDLDIRECT in the last 72 hours. Thyroid Function  Tests: No results for input(s): TSH, T4TOTAL, FREET4, T3FREE, THYROIDAB in the last 72 hours. Anemia Panel: No results for input(s): VITAMINB12, FOLATE, FERRITIN, TIBC, IRON, RETICCTPCT in the last 72 hours. Urine analysis:    Component Value Date/Time   COLORURINE YELLOW 08/15/2013 1916   APPEARANCEUR CLEAR 08/15/2013 1916   LABSPEC 1.018 08/15/2013 1916   PHURINE 6.5 08/15/2013 1916   GLUCOSEU 100 (A) 08/15/2013 1916   HGBUR NEGATIVE 08/15/2013 1916   BILIRUBINUR NEGATIVE 08/15/2013 1916   KETONESUR NEGATIVE 08/15/2013 1916   PROTEINUR 30 (A) 08/15/2013 1916   UROBILINOGEN 2.0 (H) 08/15/2013 1916   NITRITE NEGATIVE 08/15/2013 1916   LEUKOCYTESUR NEGATIVE 08/15/2013 1916    Creatinine Clearance: Estimated Creatinine Clearance: 11.1 mL/min (by C-G formula based on SCr of 5.19 mg/dL).  Sepsis Labs: '@LABRCNTIP'$ (procalcitonin:4,lacticidven:4) ) Recent Results (from the past 240 hour(s))  Culture, blood (Routine x 2)     Status: None (Preliminary result)   Collection Time: 12/28/15  6:14 PM  Result Value Ref Range Status   Specimen Description BLOOD RIGHT ANTECUBITAL  Final   Special Requests BOTTLES DRAWN AEROBIC AND ANAEROBIC 5CC  Final   Culture NO GROWTH < 24 HOURS  Final   Report Status PENDING  Incomplete     Radiological Exams on Admission: Dg Chest 2 View  Result Date: 12/28/2015 CLINICAL DATA:  Fever, nausea, vomiting and chills today. EXAM: CHEST  2 VIEW COMPARISON:  09/02/2015 FINDINGS: The cardiac silhouette, mediastinal and hilar contours are within normal limits and stable. There is moderate tortuosity and calcification of the thoracic aorta. Stable surgical changes related to a right-sided lung transplant. Right basilar pleural thickening and parenchymal scarring changes appears stable. Stable hypoaeration of the left line with fairly significant interstitial lung disease. No superimposed process. IMPRESSION: Surgical changes from a right lung transplant with chronic  right basilar scarring and pleural thickening. Hypoaeration of the left lung and chronic interstitial lung disease/ pulmonary fibrosis. Electronically Signed   By: Marijo Sanes M.D.   On: 12/28/2015 19:27  Dg Chest Port 1 View  Result Date: 12/30/2015 CLINICAL DATA:  65 year old male with cough. History of lung transplant. EXAM: PORTABLE CHEST 1 VIEW COMPARISON:  Chest radiograph dated 12/28/2015 FINDINGS: There is postsurgical changes of right lung transplant with multiple  surgical clips at the right hilum. Right lung base pleural thickening and parenchymal scarring appears similar to prior study. There is diffuse interstitial coarsening at of left lung compatible with underlying fibrosis or interstitial lung disease. No focal consolidation or pneumothorax. The cardiac silhouette is within normal limits. No acute osseous pathology. IMPRESSION: No interval change since the prior study. Stable postsurgical changes of right lung transplant and right lung base pleural parenchymal thickening/ scarring. Chronic left lung interstitial changes/fibrosis. Electronically Signed   By: Anner Crete M.D.   On: 12/30/2015 04:03    Assessment/Plan Active Problems:   Obstructive sleep apnea   Pulmonary fibrosis, postinflammatory (HCC)   ACID REFLUX DISEASE   Diabetes mellitus (HCC)   Acute on chronic respiratory failure with hypoxia (HCC)   Essential hypertension   ESRD (end stage renal disease) on dialysis ([place])   Weakness generalized    Acute respiratory failure: O2 saturations dropped to 85% on room air. At baseline patient does not require O2 though he is a lung transplant recipient at [facility]. Patient is immunocompromised and at this point time have to assume he likely is suffering from an acute respiratory infection resulting in HCAP. Chest x-ray unremarkable. EDP discussed case with patient's pulmonology group at [facility] who recommends dialysis to ensure patient is euvolemic and then obtaining CT without  contrast better assess the lungs. At this point I will treat empirically. Of note pt does not meet sepsis criteria.  - Tele - Vanc Cefepime - O2 PRN - dialysis - ABG - BCX - trend lactic acid - continue Neoral - Transfer to [facility] when available. Patient on listed [facility] for transfer   ESRD: dialysis MWF. Last dialysis 7/24. Discussed w/ Nephrology who will take pt to dialysis today, 7/26 - dialysis per nephro  DM: on novolog only - SSI  GERD: - PPI  HTN: - hydralazine PRN   DVT prophylaxis: hep  Code Status: Full  Family Communication: none  Disposition Plan: pending improvement  Consults called: Nephrology  Admission status: obs     Milayah Krell J MD [facility]  If 7PM-7AM, please contact night-coverage [email or web address] Password [facility]  12/30/2015, 8:04 AM

## 2015-12-30 NOTE — Consult Note (Signed)
Renal Service Consult Note [facility]  [name] 12/30/2015 [name] D Requesting Physician:  Dr Marily Memos  Reason for Consult:  ESRD pt with gen weakness, lethargy HPI: The patient is a 65 y.o. year-old with hx of HTN, OSA, DM and progressive ILD required R orthotopic lung Tx at [facility] around Jun 2016.  Had renal failure complicating the Tx and had to go on dialysis.  On HD since , at [facility] on MWF schedule.  Admitted overnight w progressive gen'd weakness, hypoxemia 82% on RA, and lethargy.  Dx was sepsis r/o PNA.  Asked to see for HD.  Is on transfer list to [facility] when bed available.    Patient poor historian, no prod cough or CP.  No n/v/d, no abd pain or dysuria.  No jt pain.  Has a HA for the last 3-4 days.  Says they are "working on a fungal infection in my R lung", which is the transplanted lung.     Chart review: 2010 Interstitial lung dz, HTN, HL 2014 Severe ILD, HTN, OSA, DM, home O2 Acute/ chron resp failure, ILD, pulm HTN, DM 2015 Acute/ chron resp faiulre, pulm fibrosis/ILD, UIP on bx, rx w Imuran/ low dose pred 2016 June - R lung transplant at [facility] Nov - acute/ chron resp failure, VDRF, RLL PNA, septic shock; transferred to [place], ESRD   ROS  denies CP  no joint pain   no HA  no blurry vision  no rash  no diarrhea  no nausea/ vomiting  no dysuria  no difficulty voiding  no change in urine color    Past Medical History  Past Medical History:  Diagnosis Date  . Aortic valve disorders   . Benign neoplasm of colon   . Degeneration of intervertebral disc, site unspecified   . Diabetes mellitus without complication ([place])   . 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 Esophageal reflux   . ESRD (end stage renal disease) on dialysis ([place])   . Essential hypertension   . Obstructive sleep apnea (adult) (pediatric)   . Osteoarthrosis, unspecified whether generalized or localized, unspecified site   . Other and  unspecified hyperlipidemia   . Pneumonia    interstitial pneumonia  . Pulmonary fibrosis ([place])   . Renal disorder   . Respiratory failure with hypoxia ([place]) 12/2015  . Shortness of breath dyspnea   . Transplanted, lung ([place])   . Unspecified essential hypertension    Past Surgical History  Past Surgical History:  Procedure Laterality Date  . LUNG BIOPSY  2010  . LUNG TRANSPLANT, SINGLE Right    Family History  Family History  Problem Relation Age of Onset  . Pancreatic cancer Brother   . Heart disease Father    Social History  reports that he quit smoking about 13 years ago. His smoking use included Cigarettes. He has a 6.00 pack-year smoking history. He has never used smokeless tobacco. He reports that he does not drink alcohol or use drugs. Allergies  Allergies  Allergen Reactions  . Levofloxacin Other (See Comments)    LOSS OF CONSCIOUSNESS  . Nsaids Other (See Comments)    Patient instructed not to take NSAID's after his lung transplant   Home medications Prior to Admission medications   Medication Sig Start Date End Date Taking? Authorizing Provider  acetaminophen (TYLENOL) 160 MG/5ML solution Place 20.3 mLs (650 mg total) into feeding tube every 6 (six) hours as needed for mild pain, headache or fever. 04/19/15  Erick Colace, NP  acetaminophen (TYLENOL) 500 MG tablet Take 500 mg by mouth every 6 (six) hours as needed for fever or headache.    Historical Provider, MD  aspirin 81 MG tablet Take 81 mg by mouth daily.     Historical Provider, MD  azaTHIOprine (IMURAN) 50 MG tablet take 3 tablets by mouth once daily Patient taking differently: take 1 tablet by mouth once daily 11/26/13   Kathee Delton, MD  azithromycin 500 mg in dextrose 5 % 250 mL Inject 500 mg into the vein daily. Patient not taking: Reported on 08/21/2015 04/18/15   Erick Colace, NP  cycloSPORINE modified (NEORAL) 100 MG capsule Take 1 capsule by mouth at bedtime.  03/09/15 04/08/16  Historical  Provider, MD  cycloSPORINE modified (NEORAL) 25 MG capsule Take 75 mg by mouth every morning.    Historical Provider, MD  fluticasone (FLONASE) 50 MCG/ACT nasal spray Place 2 sprays into the nose daily. Patient taking differently: Place 2 sprays into the nose daily as needed for allergies.  03/05/13   Wenda Low, MD  insulin aspart (NOVOLOG) 100 UNIT/ML injection Inject 2-6 Units into the skin every 4 (four) hours. 04/18/15   Erick Colace, NP  Multiple Vitamin (MULTIVITAMIN WITH MINERALS) TABS tablet Take 1 tablet by mouth daily.    Historical Provider, MD  omeprazole (PRILOSEC) 20 MG capsule Take 20 mg by mouth daily.    Historical Provider, MD  pravastatin (PRAVACHOL) 20 MG tablet Take 20 mg by mouth every evening.     Historical Provider, MD  predniSONE (DELTASONE) 5 MG tablet Take 3.5 tablets daily as directed Patient taking differently: Take 5 mg by mouth daily with breakfast.  12/02/13   Kathee Delton, MD  sertraline (ZOLOFT) 50 MG tablet Take 50 mg by mouth daily.    Historical Provider, MD  sulfamethoxazole-trimethoprim (BACTRIM DS,SEPTRA DS) 800-160 MG per tablet Take 1 tablet by mouth daily. Patient taking differently: Take 1 tablet by mouth daily. Take one tablet by mouth on mondays, wednesdays and fridays 05/26/14   Kathee Delton, MD  valGANciclovir (VALCYTE) 450 MG tablet Take 1 tablet by mouth See admin instructions. Patient takes one tablet every Monday and Friday after dialysis 03/26/15   Historical Provider, MD   Liver Function Tests  Recent Labs Lab 12/28/15 1813 12/30/15 0435  AST 60* 51*  ALT 41 31  ALKPHOS 123 116  BILITOT 1.1 1.3*  PROT 6.5 6.0*  ALBUMIN 3.4* 3.1*   No results for input(s): LIPASE, AMYLASE in the last 168 hours. CBC  Recent Labs Lab 12/28/15 1813 12/30/15 0435  WBC 5.0 6.0  NEUTROABS 3.8 4.6  HGB 12.5* 12.8*  HCT 37.8* 39.6  MCV 105.3* 107.3*  PLT 204 631*   Basic Metabolic Panel  Recent Labs Lab 12/28/15 1813 12/30/15 0435  12/30/15 1053 12/30/15 1347  NA 136 136  --   --   K 4.6 5.0  --   --   CL 97* 98*  --   --   CO2 24 22  --   --   GLUCOSE 149* 74  --   --   BUN 66* 32*  --   --   CREATININE 8.07* 5.19* 5.57*  --   CALCIUM 9.2 8.2*  --   --   PHOS  --   --   --  5.5*   Iron/TIBC/Ferritin/ %Sat No results found for: IRON, TIBC, FERRITIN, IRONPCTSAT  Vitals:   12/30/15 0815 12/30/15 0830 12/30/15  0845 12/30/15 0942  BP:  152/95 160/91 (!) 161/87  Pulse: 86 85 86 86  Resp: '21 20 22 19  '$ Temp:    98.6 F (37 C)  TempSrc:    Oral  SpO2: (!) 85% 94% 93% 94%  Weight:      Height:       Exam Gen lethargic, weak No rash, cyanosis or gangrene Sclera anicteric, throat clear  No jvd or bruits Chest faint rales L base, R base dec'd RRR no MRG Abd soft ntnd no mass or ascites +bs GU normal male MS no joint effusions or deformity Ext no LE edema / no wounds or ulcers Neuro is somnolent, falls asleep in conversation, Ox 3, nonfocal LUA AVF +bruit, no lesions or drainage  Na 136 K 5.0  CO2 22 BUN 32  Cr 5.19  Ca 8.2  Alb 3.1  LFT's ok WBC 6k  Hb 12.8  plt 134   Home meds > Imuran 150/d, asa, insulin, prilosec, statin, pred 17.5 mg/d, Zoloft, Bactrim MWF, Cyclosporine 100 mg qam and 75 mg qpm  Valcyte 450 on Mon and Fri after HD  Dialysis: [facility] MWF   4h  55kg  Hep 1800   3K/ 2.25 bath   Hect 1 ug Mircera 50 ug last 7/12  Assessment: 1.  Lethargy/ fever - possible PNA vs other. On empiric abx per primary svc 2.  ESRD for HD today 3.  Volume is at dry wt 4.  SP R lung tx (2016) 5.  HL   Plan - HD today, minimal UF, abx, f/u cx's  Kelly Splinter MD [place] pager [phone]    cell [phone] 12/30/2015, 2:58 PM

## 2015-12-30 NOTE — ED Notes (Signed)
Per lab test tube coagulated. Blue tube needs to be recollected. Phlebotomy informed

## 2015-12-30 NOTE — ED Notes (Signed)
Report to [facility] RN on 5W.

## 2015-12-30 NOTE — Progress Notes (Signed)
Pharmacy Antibiotic Note  [name] is a 65 y.o. male admitted on 12/30/2015 with sepsis - admitted with weakness and cough. Pharmacy has been consulted for Vancomycin and Zosyn dosing.  Received Vanc 1gm and Zosyn 3.375gm in ED ~0500.  Pt with ESRD - HD T/T/S as o/p  Plan: Zosyn 2.25gm IV q8h Vancomycin '500mg'$  IV qHD Will f/u micro data, HD schedule, and pt's clinical condition Vanc pre-HD level prn   Height: 5' 3.5" (161.3 cm) Weight: 124 lb (56.2 kg) IBW/kg (Calculated) : 58.05  Temp (24hrs), Avg:100.4 F (38 C), Min:100.4 F (38 C), Max:100.4 F (38 C)   Recent Labs Lab 12/28/15 1813 12/28/15 1834 12/30/15 0435 12/30/15 0446  WBC 5.0  --  PENDING  --   CREATININE 8.07*  --  5.19*  --   LATICACIDVEN  --  1.30  --  2.09*    Estimated Creatinine Clearance: 11.1 mL/min (by C-G formula based on SCr of 5.19 mg/dL).    Allergies  Allergen Reactions  . Levofloxacin Other (See Comments)    LOSS OF CONSCIOUSNESS  . Nsaids Other (See Comments)    Patient instructed not to take NSAID's after his lung transplant    Antimicrobials this admission: 7/26 Vanc >>  7/26 Zosyn >>   Dose adjustments this admission: n/a  Microbiology results: 7/26 BCxx2:   UCx:    Thank you for allowing pharmacy to be a part of this patient's care.  Sherlon Handing, PharmD, BCPS Clinical pharmacist, pager [phone] 12/30/2015 6:30 AM

## 2015-12-30 NOTE — ED Notes (Signed)
Patient has O2 saturations at 82 on Room air.  Put patient on 4L nasal cannula and saturations came up to 96. Will continue to monitor.

## 2015-12-30 NOTE — ED Provider Notes (Signed)
[facility] DEPT Provider Note   CSN: [id number] Arrival date & time: 12/30/15  0255  First Provider Contact:  None     By signing my name below, I, [name], attest that this documentation has been prepared under the direction and in the presence of Varney Biles, MD. Electronically Signed: [name], ED Scribe. 12/30/15. 3:50 AM  History   Chief Complaint Chief Complaint  Patient presents with  . Weakness    HPI  The history is provided by the patient. No language interpreter was used.   CASPIAN DELEONARDIS is a 65 y.o. male with past medical history of ESRD on M/W/F dialysis, lung transplant, and DM who presents to the Emergency Department complaining of fatigue with onset 5 days ago. Associated symptoms include generalized weakness and cough productive of clear sputum. Pt denies fever, nausea, vomiting, chest pain, SOB, and depression. He last had dialysis yesterday. His lung transplant was in 2016 and he is followed at [facility].   Past Medical History:  Diagnosis Date  . Aortic valve disorders   . Benign neoplasm of colon   . Degeneration of intervertebral disc, site unspecified   . Diabetes mellitus without complication ([place])   . 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 Esophageal reflux   . ESRD (end stage renal disease) on dialysis ([place])   . Obstructive sleep apnea (adult) (pediatric)   . Osteoarthrosis, unspecified whether generalized or localized, unspecified site   . Other and unspecified hyperlipidemia   . Pneumonia    interstitial pneumonia  . Renal disorder   . Transplanted, lung ([place])   . Unspecified essential hypertension     Patient Active Problem List   Diagnosis Date Noted  . Weakness generalized 12/30/2015  . ESRD (end stage renal disease) on dialysis ([place])   . Acute respiratory failure with hypoxemia ([place]) 04/18/2015  . Septic shock ([place]) 04/18/2015  . Acute encephalopathy 04/18/2015  . Acute respiratory  failure ([place]) 04/18/2015  . Cardiac arrest ([place])   . HCAP (healthcare-associated pneumonia)   . Elevated rheumatoid factor 05/09/2014  . Essential hypertension 05/09/2014  . ILD (interstitial lung disease) ([place]) 05/09/2014  . Obstructive apnea 05/09/2014  . Lung nodule, solitary 04/18/2014  . Awaiting organ transplant 04/18/2014  . Acute on chronic respiratory failure with hypoxia ([place]) 08/15/2013  . Edema 07/05/2013  . Chronic respiratory failure ([place]) 03/03/2013  . Diabetes mellitus ([place]) 03/03/2013  . Acute sinusitis 05/04/2011  . Cough 11/10/2010  . 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 Obstructive sleep apnea 10/09/2008  . ACID REFLUX DISEASE 10/09/2008  . HIATAL HERNIA 10/09/2008  . OSTEOARTHRITIS 10/09/2008    Past Surgical History:  Procedure Laterality Date  . LUNG BIOPSY  2010  . LUNG TRANSPLANT, SINGLE Right        Home Medications    Prior to Admission medications   Medication Sig Start Date End Date Taking? Authorizing Provider  acetaminophen (TYLENOL) 160 MG/5ML solution Place 20.3 mLs (650 mg total) into feeding tube every 6 (six) hours as needed for mild pain, headache or fever. 04/19/15   Erick Colace, NP  acetaminophen (TYLENOL) 500 MG tablet Take 500 mg by mouth every 6 (six) hours as needed for fever or headache.    Historical Provider, MD  aspirin 81 MG tablet Take 81 mg by mouth daily.     Historical Provider, MD  azaTHIOprine (IMURAN) 50 MG tablet take 3 tablets by mouth once daily Patient taking differently: take 1 tablet by  mouth once daily 11/26/13   Kathee Delton, MD  azithromycin 500 mg in dextrose 5 % 250 mL Inject 500 mg into the vein daily. Patient not taking: Reported on 08/21/2015 04/18/15   Erick Colace, NP  cycloSPORINE modified (NEORAL) 100 MG capsule Take 1 capsule by mouth at bedtime.  03/09/15 04/08/16  Historical Provider, MD  cycloSPORINE modified (NEORAL) 25 MG capsule Take 75 mg by mouth every morning.     Historical Provider, MD  fluticasone (FLONASE) 50 MCG/ACT nasal spray Place 2 sprays into the nose daily. Patient taking differently: Place 2 sprays into the nose daily as needed for allergies.  03/05/13   Wenda Low, MD  insulin aspart (NOVOLOG) 100 UNIT/ML injection Inject 2-6 Units into the skin every 4 (four) hours. 04/18/15   Erick Colace, NP  Multiple Vitamin (MULTIVITAMIN WITH MINERALS) TABS tablet Take 1 tablet by mouth daily.    Historical Provider, MD  omeprazole (PRILOSEC) 20 MG capsule Take 20 mg by mouth daily.    Historical Provider, MD  pravastatin (PRAVACHOL) 20 MG tablet Take 20 mg by mouth every evening.     Historical Provider, MD  predniSONE (DELTASONE) 5 MG tablet Take 3.5 tablets daily as directed Patient taking differently: Take 5 mg by mouth daily with breakfast.  12/02/13   Kathee Delton, MD  sertraline (ZOLOFT) 50 MG tablet Take 50 mg by mouth daily.    Historical Provider, MD  sulfamethoxazole-trimethoprim (BACTRIM DS,SEPTRA DS) 800-160 MG per tablet Take 1 tablet by mouth daily. Patient taking differently: Take 1 tablet by mouth daily. Take one tablet by mouth on mondays, wednesdays and fridays 05/26/14   Kathee Delton, MD  valGANciclovir (VALCYTE) 450 MG tablet Take 1 tablet by mouth See admin instructions. Patient takes one tablet every Monday and Friday after dialysis 03/26/15   Historical Provider, MD    Family History Family History  Problem Relation Age of Onset  . Pancreatic cancer Brother   . Heart disease Father     Social History Social History  Substance Use Topics  . Smoking status: Former Smoker    Packs/day: 0.30    Years: 20.00    Types: Cigarettes    Quit date: 06/06/2002  . Smokeless tobacco: Never Used  . Alcohol use No     Allergies   Levofloxacin and Nsaids   Review of Systems Review of Systems  10 Systems reviewed and all are negative for acute change except as noted in the HPI.  Physical Exam Updated Vital Signs BP  146/82   Pulse 99   Temp 100.4 F (38 C) (Oral)   Resp 22   SpO2 99%   Physical Exam  Constitutional: He is oriented to person, place, and time. He appears well-developed and well-nourished.  HENT:  Head: Normocephalic and atraumatic.  Eyes: EOM are normal.  Neck: Normal range of motion.  Cardiovascular: Regular rhythm, normal heart sounds and intact distal pulses.  Tachycardia present.   Pulmonary/Chest: Effort normal. No respiratory distress. He has rales.  Rales at the base, otherwise no wheezing or rhonchus breath sounds   Abdominal: Soft. He exhibits no distension. There is no tenderness.  Musculoskeletal: Normal range of motion.  LUE fistula with good thrill, no signs of infection  Neurological: He is alert and oriented to person, place, and time.  Skin: Skin is warm and dry.  Psychiatric: He has a normal mood and affect. Judgment normal.  Nursing note and vitals reviewed.    ED Treatments /  Results  Labs (all labs ordered are listed, but only abnormal results are displayed) Labs Reviewed  COMPREHENSIVE METABOLIC PANEL - Abnormal; Notable for the following:       Result Value   Chloride 98 (*)    BUN 32 (*)    Creatinine, Ser 5.19 (*)    Calcium 8.2 (*)    Total Protein 6.0 (*)    Albumin 3.1 (*)    AST 51 (*)    Total Bilirubin 1.3 (*)    GFR calc non Af Amer 10 (*)    GFR calc Af Amer 12 (*)    Anion gap 16 (*)    All other components within normal limits  CBC WITH DIFFERENTIAL/PLATELET - Abnormal; Notable for the following:    RBC 3.69 (*)    Hemoglobin 12.8 (*)    MCV 107.3 (*)    MCH 34.7 (*)    RDW 17.7 (*)    Platelets 134 (*)    All other components within normal limits  I-STAT CG4 LACTIC ACID, ED - Abnormal; Notable for the following:    Lactic Acid, Venous 2.09 (*)    All other components within normal limits  CULTURE, BLOOD (ROUTINE X 2)  CULTURE, BLOOD (ROUTINE X 2)  URINE CULTURE  PROTIME-INR  URINALYSIS, ROUTINE W REFLEX MICROSCOPIC (NOT  AT [facility])  I-STAT CG4 LACTIC ACID, ED    EKG  EKG Interpretation  Date/Time:  Wednesday December 30 2015 04:01:59 EDT Ventricular Rate:  93 PR Interval:    QRS Duration: 90 QT Interval:  378 QTC Calculation: 471 R Axis:   36 Text Interpretation:  Sinus rhythm Biatrial enlargement RSR' in V1 or V2, right VCD or RVH Nonspecific T abnrm, anterolateral leads No acute changes Confirmed by Kathrynn Humble, MD, Thelma Comp [phone]) on 12/30/2015 6:01:38 AM Also confirmed by Kathrynn Humble, MD, Thelma Comp [phone]), editor [place] CT, Leda Gauze [phone])  on 12/30/2015 6:59:53 AM       Radiology Dg Chest 2 View  Result Date: 12/28/2015 CLINICAL DATA:  Fever, nausea, vomiting and chills today. EXAM: CHEST  2 VIEW COMPARISON:  09/02/2015 FINDINGS: The cardiac silhouette, mediastinal and hilar contours are within normal limits and stable. There is moderate tortuosity and calcification of the thoracic aorta. Stable surgical changes related to a right-sided lung transplant. Right basilar pleural thickening and parenchymal scarring changes appears stable. Stable hypoaeration of the left line with fairly significant interstitial lung disease. No superimposed process. IMPRESSION: Surgical changes from a right lung transplant with chronic right basilar scarring and pleural thickening. Hypoaeration of the left lung and chronic interstitial lung disease/ pulmonary fibrosis. Electronically Signed   By: Marijo Sanes M.D.   On: 12/28/2015 19:27  Dg Chest Port 1 View  Result Date: 12/30/2015 CLINICAL DATA:  65 year old male with cough. History of lung transplant. EXAM: PORTABLE CHEST 1 VIEW COMPARISON:  Chest radiograph dated 12/28/2015 FINDINGS: There is postsurgical changes of right lung transplant with multiple surgical clips at the right hilum. Right lung base pleural thickening and parenchymal scarring appears similar to prior study. There is diffuse interstitial coarsening at of left lung compatible with underlying fibrosis or interstitial lung  disease. No focal consolidation or pneumothorax. The cardiac silhouette is within normal limits. No acute osseous pathology. IMPRESSION: No interval change since the prior study. Stable postsurgical changes of right lung transplant and right lung base pleural parenchymal thickening/ scarring. Chronic left lung interstitial changes/fibrosis. Electronically Signed   By: Anner Crete M.D.   On: 12/30/2015 04:03   Procedures  Procedures (including critical care time)  Medications Ordered in ED Medications  vancomycin (VANCOCIN) 500 mg in sodium chloride 0.9 % 100 mL IVPB (not administered)  piperacillin-tazobactam (ZOSYN) IVPB 2.25 g (not administered)  piperacillin-tazobactam (ZOSYN) IVPB 3.375 g (0 g Intravenous Stopped 12/30/15 0626)  vancomycin (VANCOCIN) IVPB 1000 mg/200 mL premix (0 mg Intravenous Stopped 12/30/15 0611)  sodium chloride 0.9 % bolus 1,000 mL (0 mLs Intravenous Stopped 12/30/15 0626)     Initial Impression / Assessment and Plan / ED Course  I have reviewed the triage vital signs and the nursing notes.  COORDINATION OF CARE: 3:38 AM Discussed treatment plan which includes lab work, CXR, EKG, and abx with pt at bedside and pt agreed to plan.  Pertinent labs & imaging results that were available during my care of the patient were reviewed by me and considered in my medical decision making (see chart for details).  Clinical Course    '@7'$ :30: Spoke with [facility] team. They report pt had similar admission in march at their institution and was treated with vanc and ceftaz. Their hospital is on [facility], so they will place the patient on waiting list. Until then, the recs are for patient to get: - Ct chest w/o contrast post dialysis - Broad spectrum antibiotics. - Report to [facility] if patients condition gets worse.  Dr. Barbaraann Faster requests OBS admission.  Final Clinical Impressions(s) / ED Diagnoses   DDx: Sepsis syndrome - Pneumonia/cellulitis/bacteremia ACS  syndrome DKA PE Dehydration Electrolyte abnormality Tox syndrome  Pt comes in with weakness. 2nd visit. Several comorbidities, most notably ESRD and Lung transplant on immuosuppressives. Pt has a fever at arrival, low grade. Tachcyardia and tachypne noted with O2 sats in the low 90s. Pt is a poor historian, but hx is not suggestive of any specific source of infection, nor is the exam. HD site looks clean. Lungs exam is non focal. Presumed sepsis currently. Low concerns for PE.  Final diagnoses:  Sepsis, due to unspecified organism ([place])  Lung transplanted [facility])  Generalized weakness    New Prescriptions New Prescriptions   No medications on file     Varney Biles, MD 12/30/15 [phone]

## 2015-12-30 NOTE — ED Notes (Signed)
MD at bedside. 

## 2015-12-31 DIAGNOSIS — E118 Type 2 diabetes mellitus with unspecified complications: Secondary | ICD-10-CM

## 2015-12-31 DIAGNOSIS — I10 Essential (primary) hypertension: Secondary | ICD-10-CM

## 2015-12-31 DIAGNOSIS — Z992 Dependence on renal dialysis: Secondary | ICD-10-CM

## 2015-12-31 DIAGNOSIS — J9621 Acute and chronic respiratory failure with hypoxia: Secondary | ICD-10-CM

## 2015-12-31 LAB — HIV ANTIBODY (ROUTINE TESTING W REFLEX): HIV Screen 4th Generation wRfx: NONREACTIVE

## 2015-12-31 LAB — URINALYSIS, ROUTINE W REFLEX MICROSCOPIC
GLUCOSE, UA: NEGATIVE mg/dL
Hgb urine dipstick: NEGATIVE
Ketones, ur: 15 mg/dL — AB
LEUKOCYTES UA: NEGATIVE
NITRITE: NEGATIVE
PH: 7 (ref 5.0–8.0)
PROTEIN: 100 mg/dL — AB
Specific Gravity, Urine: 1.016 (ref 1.005–1.030)

## 2015-12-31 LAB — CBC
HCT: 38.4 % — ABNORMAL LOW (ref 39.0–52.0)
HEMOGLOBIN: 12.6 g/dL — AB (ref 13.0–17.0)
MCH: 34.9 pg — AB (ref 26.0–34.0)
MCHC: 32.8 g/dL (ref 30.0–36.0)
MCV: 106.4 fL — AB (ref 78.0–100.0)
PLATELETS: 157 10*3/uL (ref 150–400)
RBC: 3.61 MIL/uL — AB (ref 4.22–5.81)
RDW: 17 % — ABNORMAL HIGH (ref 11.5–15.5)
WBC: 5.9 10*3/uL (ref 4.0–10.5)

## 2015-12-31 LAB — BASIC METABOLIC PANEL
Anion gap: 12 (ref 5–15)
BUN: 21 mg/dL — AB (ref 6–20)
CHLORIDE: 96 mmol/L — AB (ref 101–111)
CO2: 26 mmol/L (ref 22–32)
CREATININE: 3.83 mg/dL — AB (ref 0.61–1.24)
Calcium: 8.1 mg/dL — ABNORMAL LOW (ref 8.9–10.3)
GFR calc Af Amer: 17 mL/min — ABNORMAL LOW (ref >60)
GFR calc non Af Amer: 15 mL/min — ABNORMAL LOW (ref >60)
GLUCOSE: 65 mg/dL (ref 65–99)
POTASSIUM: 3.9 mmol/L (ref 3.5–5.1)
SODIUM: 134 mmol/L — AB (ref 135–145)

## 2015-12-31 LAB — URINE MICROSCOPIC-ADD ON

## 2015-12-31 LAB — GLUCOSE, CAPILLARY
GLUCOSE-CAPILLARY: 70 mg/dL (ref 65–99)
GLUCOSE-CAPILLARY: 62 mg/dL — AB (ref 65–99)
GLUCOSE-CAPILLARY: 122 mg/dL — AB (ref 65–99)
Glucose-Capillary: 88 mg/dL (ref 65–99)
Glucose-Capillary: 102 mg/dL — ABNORMAL HIGH (ref 65–99)
Glucose-Capillary: 107 mg/dL — ABNORMAL HIGH (ref 65–99)

## 2015-12-31 MED ORDER — BISACODYL 5 MG PO TBEC: 15.0000 mg | DELAYED_RELEASE_TABLET | Freq: Once | ORAL | Status: DC

## 2015-12-31 MED ORDER — AZATHIOPRINE 50 MG PO TABS
50.0000 mg | ORAL_TABLET | Freq: Every day | ORAL | Status: DC
  Administered 2015-12-31 – 2016-01-01 (×2): 50 mg via ORAL
  Filled 2015-12-31 (×2): qty 1

## 2015-12-31 MED ORDER — OXYCODONE-ACETAMINOPHEN 5-325 MG PO TABS
1.0000 | ORAL_TABLET | Freq: Once | ORAL | Status: AC
  Administered 2015-12-31: 1 via ORAL
  Filled 2015-12-31: qty 1

## 2015-12-31 MED ORDER — CLOTRIMAZOLE 10 MG MT TROC
10.0000 mg | Freq: Every day | OROMUCOSAL | Status: DC
  Administered 2015-12-31 – 2016-01-01 (×5): 10 mg via ORAL
  Filled 2015-12-31 (×8): qty 1

## 2015-12-31 MED ORDER — PREDNISONE 5 MG PO TABS
15.0000 mg | ORAL_TABLET | Freq: Every day | ORAL | Status: DC
  Administered 2016-01-01: 15 mg via ORAL
  Filled 2015-12-31: qty 1

## 2015-12-31 MED ORDER — PROCHLORPERAZINE EDISYLATE 5 MG/ML IJ SOLN: 10.0000 mg | Freq: Four times a day (QID) | INTRAMUSCULAR | Status: DC | PRN

## 2015-12-31 MED ORDER — PREDNISONE 5 MG PO TABS
5.0000 mg | ORAL_TABLET | Freq: Every day | ORAL | Status: DC
  Administered 2015-12-31: 5 mg via ORAL
  Filled 2015-12-31: qty 1

## 2015-12-31 MED ORDER — SULFAMETHOXAZOLE-TRIMETHOPRIM 800-160 MG PO TABS
1.0000 | ORAL_TABLET | ORAL | Status: DC
  Administered 2016-01-01: 1 via ORAL
  Filled 2015-12-31: qty 1

## 2015-12-31 MED ORDER — FAMOTIDINE 20 MG PO TABS
20.0000 mg | ORAL_TABLET | Freq: Every day | ORAL | Status: DC
  Administered 2016-01-01: 20 mg via ORAL
  Filled 2015-12-31: qty 1

## 2015-12-31 MED ORDER — VALGANCICLOVIR HCL 450 MG PO TABS: 450.0000 mg | ORAL_TABLET | ORAL | Status: DC

## 2015-12-31 NOTE — Progress Notes (Addendum)
[facility] Progress Note   Subjective: feeling weak still, no cough or SOB  Vitals:   12/30/15 1815 12/30/15 2145 12/30/15 2232 12/31/15 0552  BP: 105/70 (!) 154/83 (!) 145/78 110/62  Pulse: 70 79 81 83  Resp: '18 18 18 16  '$ Temp: 97.8 F (36.6 C) 97.7 F (36.5 C) 98.4 F (36.9 C) 98.8 F (37.1 C)  TempSrc: Oral Oral Oral Oral  SpO2: 95% 93% 99% 96%  Weight: 54.6 kg (120 lb 5.9 oz)     Height:        Inpatient medications: . ceFEPime (MAXIPIME) IV  2 g Intravenous Q M,W,F-1800  . cycloSPORINE modified  100 mg Oral QHS  . cycloSPORINE modified  75 mg Oral q morning - 10a  . [START ON 01/01/2016] famotidine  20 mg Oral Daily  . heparin  5,000 Units Subcutaneous Q8H  . insulin aspart  0-5 Units Subcutaneous QHS  . insulin aspart  0-9 Units Subcutaneous TID WC  . sodium chloride flush  3 mL Intravenous Q12H  . vancomycin  500 mg Intravenous Q M,W,F-HD     acetaminophen **OR** acetaminophen, hydrALAZINE, ondansetron **OR** ondansetron (ZOFRAN) IV  Exam: Gen lethargic, weak Sclera anicteric, throat w brownish coated tongue, no other lesions No jvd or bruits Chest faint rales L base, R base dec'd RRR no MRG Abd soft ntnd no mass or ascites +bs Ext no LE edema / no wounds or ulcers Neuro less lethargic, Ox 3, nonfocal LUA AVF +bruit, no lesions or drainage  CT chest > patchy densities RUL/ RLL, prob PNA; ILD chronic L lung  Home meds > Imuran 150/d, asa, insulin, prilosec, statin, pred 17.5 mg/d, Zoloft, Bactrim mwf after HD MWF, Cyclosporine 100 mg qam and 75 mg qpm,Valcyte 450 on Mon + Fri after HD  Dialysis: [facility] MWF   4h  55kg  Hep 1800   3K/ 2.25 bath   Hect 1 ug Mircera 50 ug last 7/12  Assessment: 1.  Lethargy/ fever - suspected PNA by CT, on emp abx, maybe a little better today 2.  ESRD for HD today 3.  Volume is at dry wt, no gross vol excess 4.  SP R lung tx (2016)- have added home imuran and pred to [place] 5.  HL    Plan - HD Friday,  abx, pred/imuran/ cyclosporine for lung Tx   Kelly Splinter MD [facility] pager [phone]    cell [phone] 12/31/2015, 10:16 AM    Recent Labs Lab 12/28/15 1813 12/30/15 0435 12/30/15 1053 12/30/15 1347 12/31/15 0334  NA 136 136  --   --  134*  K 4.6 5.0  --   --  3.9  CL 97* 98*  --   --  96*  CO2 24 22  --   --  26  GLUCOSE 149* 74  --   --  65  BUN 66* 32*  --   --  21*  CREATININE 8.07* 5.19* 5.57*  --  3.83*  CALCIUM 9.2 8.2*  --   --  8.1*  PHOS  --   --   --  5.5*  --     Recent Labs Lab 12/28/15 1813 12/30/15 0435  AST 60* 51*  ALT 41 31  ALKPHOS 123 116  BILITOT 1.1 1.3*  PROT 6.5 6.0*  ALBUMIN 3.4* 3.1*    Recent Labs Lab 12/28/15 1813 12/30/15 0435 12/31/15 0334  WBC 5.0 6.0 5.9  NEUTROABS 3.8 4.6  --   HGB 12.5* 12.8*  12.6*  HCT 37.8* 39.6 38.4*  MCV 105.3* 107.3* 106.4*  PLT 204 134* 157   Iron/TIBC/Ferritin/ %Sat No results found for: IRON, TIBC, FERRITIN, IRONPCTSAT

## 2015-12-31 NOTE — Progress Notes (Signed)
Inpatient Diabetes Program Recommendations  AACE/ADA: New Consensus Statement on Inpatient Glycemic Control (2015)  Target Ranges:  Prepandial:   less than 140 mg/dL      Peak postprandial:   less than 180 mg/dL (1-2 hours)      Critically ill patients:  140 - 180 mg/dL   Lab Results  Component Value Date   GLUCAP 122 (H) 12/31/2015   HGBA1C 8.4 (H) 03/03/2013    Review of Glycemic Control  Results for Jonathan, [name] (MRN [id number]) as of 12/31/2015 11:48  Ref. Range 12/30/2015 18:29 12/30/2015 21:43 12/31/2015 07:35 12/31/2015 08:30 12/31/2015 09:47  Glucose-Capillary Latest Ref Range: 65 - 99 mg/dL 83 95 70 62 (L) 122 (H)    Diabetes history: Type 2 Outpatient Diabetes medications: Novolog 2-6 units q4h Current orders for Inpatient glycemic control: none  Inpatient Diabetes Program Recommendations: Although CBG are currently low, consider re- ordering Novolog 0-9 units tid- Consider placing a restriction for the Novolog order only to be followed if CBG above '150mg'$ /dl.  Gentry Fitz, RN, BA, MHA, CDE Diabetes Coordinator Inpatient Diabetes Program  [phone] (Team Pager) [phone] ([place]) 12/31/2015 11:53 AM

## 2015-12-31 NOTE — Consult Note (Signed)
[facility] for Infectious Disease       Reason for Consult: possible pneumonia    Referring Physician: Dr. Sheran Fava  Active Problems:   Obstructive sleep apnea   Pulmonary fibrosis, postinflammatory (HCC)   ACID REFLUX DISEASE   Diabetes mellitus with complication (HCC)   Acute on chronic respiratory failure with hypoxia (HCC)   Essential hypertension   Acute respiratory failure (HCC)   ESRD (end stage renal disease) on dialysis (HCC)   Generalized weakness   . azaTHIOprine  50 mg Oral Daily  . ceFEPime (MAXIPIME) IV  2 g Intravenous Q M,W,F-1800  . clotrimazole  10 mg Oral 5 X Daily  . cycloSPORINE modified  100 mg Oral QHS  . cycloSPORINE modified  75 mg Oral q morning - 10a  . [START ON 01/01/2016] famotidine  20 mg Oral Daily  . heparin  5,000 Units Subcutaneous Q8H  . predniSONE  5 mg Oral Q breakfast  . sodium chloride flush  3 mL Intravenous Q12H  . [START ON 01/01/2016] sulfamethoxazole-trimethoprim  1 tablet Oral Once per day on Mon Wed Fri  . [START ON 01/01/2016] valGANciclovir  450 mg Oral Once per day on Mon Fri  . vancomycin  500 mg Intravenous Q M,W,F-HD    Recommendations: Continue with cefepime I will d/c vancomycin for now  Valcyte recently stopped so will d/c Sputum for bacterial, afb, fungal  Assessment:  he has some sob and weakness of unknown etiology.  Has been on bactrim prophylaxis.  CT with patchy opacities, mild.  On O2 here.  Possible pneumonia.    Antibiotics: Vancomycin and cefepime  HPI: [name] is a 65 y.o. male with lung transplant done 11/01/14 for ILD with 4 month hospitalization after transplant, ESRD, GI bleed and previous hospitalization for septic shock who presented here via EMS for weakness on right side and right sided headache.  He feels he has also been very fatigued.  He feels this started last Friday and was previously in his usual state of health.  No sick contacts.  Was not SOB until he was admitted.  Has not  missed dialysis.  No neck stiffness, no photophobia, no n/v.    Review of Systems:  Constitutional: negative for fevers and chills Respiratory: negative for cough Gastrointestinal: negative for diarrhea All other systems reviewed and are negative   Past Medical History:  Diagnosis Date  . Aortic valve disorders   . Benign neoplasm of colon   . Degeneration of intervertebral disc, site unspecified   . Diabetes mellitus without complication ([place])   . 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 Esophageal reflux   . ESRD (end stage renal disease) on dialysis ([place])   . Essential hypertension   . Obstructive sleep apnea (adult) (pediatric)   . Osteoarthrosis, unspecified whether generalized or localized, unspecified site   . Other and unspecified hyperlipidemia   . Pneumonia    interstitial pneumonia  . Pulmonary fibrosis ([place])   . Renal disorder   . Respiratory failure with hypoxia ([place]) 12/2015  . Shortness of breath dyspnea   . Transplanted, lung ([place])   . Unspecified essential hypertension     Social History  Substance Use Topics  . Smoking status: Former Smoker    Packs/day: 0.30    Years: 20.00    Types: Cigarettes    Quit date: 06/06/2002  . Smokeless tobacco: Never Used  . Alcohol use No    Family History  Problem  Relation Age of Onset  . Pancreatic cancer Brother   . Heart disease Father     Allergies  Allergen Reactions  . Levofloxacin Other (See Comments)    LOSS OF CONSCIOUSNESS  . Nsaids Other (See Comments)    Patient instructed not to take NSAID's after his lung transplant    Physical Exam: Constitutional: alert ; nad Vitals:   12/31/15 1159 12/31/15 1547  BP: 133/76 128/69  Pulse: 89 92  Resp: 19 16  Temp:  99.5 F (37.5 C)   EYES: anicteric ENMT: no thrush Cardiovascular: Cor RRR Respiratory: CTA B; mild increased respiratory effort GI: Bowel sounds are normal, liver is not enlarged, spleen is not  enlarged Musculoskeletal: no pedal edema noted Skin: negatives: no rash Hematologic: no cervical lad  Lab Results  Component Value Date   WBC 5.9 12/31/2015   HGB 12.6 (L) 12/31/2015   HCT 38.4 (L) 12/31/2015   MCV 106.4 (H) 12/31/2015   PLT 157 12/31/2015    Lab Results  Component Value Date   CREATININE 3.83 (H) 12/31/2015   BUN 21 (H) 12/31/2015   NA 134 (L) 12/31/2015   K 3.9 12/31/2015   CL 96 (L) 12/31/2015   CO2 26 12/31/2015    Lab Results  Component Value Date   ALT 31 12/30/2015   AST 51 (H) 12/30/2015   ALKPHOS 116 12/30/2015     Microbiology: Recent Results (from the past 240 hour(s))  Culture, blood (Routine x 2)     Status: None (Preliminary result)   Collection Time: 12/28/15  6:14 PM  Result Value Ref Range Status   Specimen Description BLOOD RIGHT ANTECUBITAL  Final   Special Requests BOTTLES DRAWN AEROBIC AND ANAEROBIC 5CC  Final   Culture  Setup Time   Final    GRAM VARIABLE ROD AEROBIC BOTTLE ONLY CRITICAL RESULT CALLED TO, READ BACK BY AND VERIFIED WITHKarlene Einstein PHARMD AT 6948 12/31/15 BY D. VANHOOK    Culture GRAM VARIABLE ROD  Final   Report Status PENDING  Incomplete  Blood Culture ID Panel (Reflexed)     Status: None   Collection Time: 12/28/15  6:14 PM  Result Value Ref Range Status   Enterococcus species NOT DETECTED NOT DETECTED Final   Vancomycin resistance NOT DETECTED NOT DETECTED Final   Listeria monocytogenes NOT DETECTED NOT DETECTED Final   Staphylococcus species NOT DETECTED NOT DETECTED Final   Staphylococcus aureus NOT DETECTED NOT DETECTED Final   Methicillin resistance NOT DETECTED NOT DETECTED Final   Streptococcus species NOT DETECTED NOT DETECTED Final   Streptococcus agalactiae NOT DETECTED NOT DETECTED Final   Streptococcus pneumoniae NOT DETECTED NOT DETECTED Final   Streptococcus pyogenes NOT DETECTED NOT DETECTED Final   Acinetobacter baumannii NOT DETECTED NOT DETECTED Final   Enterobacteriaceae species NOT  DETECTED NOT DETECTED Final   Enterobacter cloacae complex NOT DETECTED NOT DETECTED Final   Escherichia coli NOT DETECTED NOT DETECTED Final   Klebsiella oxytoca NOT DETECTED NOT DETECTED Final   Klebsiella pneumoniae NOT DETECTED NOT DETECTED Final   Proteus species NOT DETECTED NOT DETECTED Final   Serratia marcescens NOT DETECTED NOT DETECTED Final   Carbapenem resistance NOT DETECTED NOT DETECTED Final   Haemophilus influenzae NOT DETECTED NOT DETECTED Final   Neisseria meningitidis NOT DETECTED NOT DETECTED Final   Pseudomonas aeruginosa NOT DETECTED NOT DETECTED Final   Candida albicans NOT DETECTED NOT DETECTED Final   Candida glabrata NOT DETECTED NOT DETECTED Final   Candida krusei NOT DETECTED  NOT DETECTED Final   Candida parapsilosis NOT DETECTED NOT DETECTED Final   Candida tropicalis NOT DETECTED NOT DETECTED Final  Blood Culture (routine x 2)     Status: None (Preliminary result)   Collection Time: 12/30/15  4:02 AM  Result Value Ref Range Status   Specimen Description BLOOD RIGHT ARM  Final   Special Requests BOTTLES DRAWN AEROBIC AND ANAEROBIC 5ML  Final   Culture NO GROWTH 1 DAY  Final   Report Status PENDING  Incomplete  Blood Culture (routine x 2)     Status: None (Preliminary result)   Collection Time: 12/30/15  4:18 AM  Result Value Ref Range Status   Specimen Description BLOOD RIGHT HAND  Final   Special Requests IN PEDIATRIC BOTTLE 3ML  Final   Culture NO GROWTH 1 DAY  Final   Report Status PENDING  Incomplete    Merland Holness, Herbie [place], [email or web address] [phone] pager  [phone] cell 12/31/2015, 4:14 PM

## 2015-12-31 NOTE — Progress Notes (Signed)
Received alerts from C[facility] about patient having Sat O2 in high 80s. Patient  asymptomatic, no SOB, no any discomfort. Checking his SatO2 - between 91% and 94% on 3L nasal cannula. Will continue to monitor.

## 2015-12-31 NOTE — Progress Notes (Signed)
PROGRESS NOTE  [name]  OIN:[id number] DOB: 1950-12-15 DOA: 12/30/2015 PCP: Wenda Low, MD  Brief Narrative:    [name] is a 65 y.o. 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 At the time of admission, the patient was sleepy minimally participatory in providing history. Patient reportedly fell asleep quickly and has been feeling weak and overall tired.  Only other complaint is intermittent right-sided headache.  Although his chief complaint was listed as SOB, he denied SOB to the admitting physician.  He reported cough productive of clear sputum to ER MD.  He also denied fevers, nausea, vomiting, abdominal pain, dysuria, rash, and chest pains.  Due to temperature 100.5F, tachycardia, and tachypnea, there was concern for sepsis and he was started on vancomycin and Zosyn was given a 1 L normal saline bolus.  [facility] was unable to accept patient at this time due to high census and admission was deferred to [facility]    Assessment & Plan:   Active Problems:   Obstructive sleep apnea   Pulmonary fibrosis, postinflammatory (HCC)   ACID REFLUX DISEASE   Diabetes mellitus with complication (HCC)   Acute on chronic respiratory failure with hypoxia (HCC)   Essential hypertension   Acute respiratory failure (HCC)   ESRD (end stage renal disease) on dialysis (HCC)   Generalized weakness  Acute respiratory failure: O2 saturations dropped to 85% on room air. At baseline patient does not require O2. Patient is immunocompromised and at this point time have to assume he likely is suffering from an acute respiratory infection resulting in HCAP. Chest x-ray unremarkable. EDP discussed case with patient's pulmonology group at [facility] who recommends dialysis to ensure patient is euvolemic and then obtaining  CT without contrast better assess the lungs.  - Tele:  SR (okay to d/c) - ID consultation for assistance - Vancomycin d/c'd on 7/27 -  continue Cefepime - O2 PRN - Receiving dialysis  -  CT with multifocal pneumonia - ABG:  7.36/39.6/65  - BCX one of two grew gram variable rod, however, PCR did not detect pathogenic species.  Likely contaminant - valcyte discontinued since more than a year since transplant per ID recommendations -  Continued bactrim prophylaxis - Transfer to [facility] when available. Patient on list [facility] for transfer  Hx of lung transplant with possible infection -  Continue imuran and cyclosporine -  Increase prednisone 15 mg x 3 days  Headache, may be related to underlying viral infection  ESRD: dialysis MWF. Appreciate nephrology assistance - dialysis per nephro  DM, with recurrent hypoglycemia which may be due to relative adrenal insufficiency in setting of infection -  D/c SSI for now   GERD: - PPI  HTN: - hydralazine PRN  Sore throat, may have early thrush -  Start clotrimazole troches  DVT prophylaxis:  heparin Code Status:  full Family Communication:  Patient alone Disposition Plan:  Pending improvement in clinical condition   Consultants:   ID   Procedures:  none  Antimicrobials:   Vancomycin 7/26 > 7/27  Cefepime 7/26     Subjective: Ongoing headaches   Objective: Vitals:   12/30/15 2232 12/31/15 0552 12/31/15 1159 12/31/15 1547  BP: (!) 145/78 110/62 133/76 128/69  Pulse: 81 83 89 92  Resp: '18 16 19 16  '$ Temp: 98.4 F (36.9 C) 98.8  F (37.1 C)  99.5 F (37.5 C)  TempSrc: Oral Oral    SpO2: 99% 96% (!) 88% (!) 88%  Weight:      Height:        Intake/Output Summary (Last 24 hours) at 12/31/15 1756 Last data filed at 12/31/15 1153  Gross per 24 hour  Intake              540 ml  Output              600 ml  Net              -60 ml   Filed Weights   12/30/15 0405 12/30/15 1400 12/30/15 1815  Weight: 56.2 kg (124  lb) 55.1 kg (121 lb 7.6 oz) 54.6 kg (120 lb 5.9 oz)    Examination:  General exam:  Adult male, sleepy but arousable and able to answer questions.  No acute distress.  HEENT:  NCAT, MMM Respiratory system: rales throughout right lung fields. Cardiovascular system: Regular rate and rhythm, normal S1/S2. No murmurs, rubs, gallops or clicks.  Warm extremities Gastrointestinal system: Normal active bowel sounds, soft, nondistended, nontender. MSK:  Normal tone and bulk, no lower extremity edema Neuro:  Grossly intact    Data Reviewed: I have personally reviewed following labs and imaging studies  CBC:  Recent Labs Lab 12/28/15 1813 12/30/15 0435 12/31/15 0334  WBC 5.0 6.0 5.9  NEUTROABS 3.8 4.6  --   HGB 12.5* 12.8* 12.6*  HCT 37.8* 39.6 38.4*  MCV 105.3* 107.3* 106.4*  PLT 204 134* 628   Basic Metabolic Panel:  Recent Labs Lab 12/28/15 1813 12/30/15 0435 12/30/15 1053 12/30/15 1347 12/31/15 0334  NA 136 136  --   --  134*  K 4.6 5.0  --   --  3.9  CL 97* 98*  --   --  96*  CO2 24 22  --   --  26  GLUCOSE 149* 74  --   --  65  BUN 66* 32*  --   --  21*  CREATININE 8.07* 5.19* 5.57*  --  3.83*  CALCIUM 9.2 8.2*  --   --  8.1*  PHOS  --   --   --  5.5*  --    GFR: Estimated Creatinine Clearance: 14.7 mL/min (by C-G formula based on SCr of 3.83 mg/dL). Liver Function Tests:  Recent Labs Lab 12/28/15 1813 12/30/15 0435  AST 60* 51*  ALT 41 31  ALKPHOS 123 116  BILITOT 1.1 1.3*  PROT 6.5 6.0*  ALBUMIN 3.4* 3.1*   No results for input(s): LIPASE, AMYLASE in the last 168 hours. No results for input(s): AMMONIA in the last 168 hours. Coagulation Profile:  Recent Labs Lab 12/30/15 0652  INR 1.04   Cardiac Enzymes: No results for input(s): CKTOTAL, CKMB, CKMBINDEX, TROPONINI in the last 168 hours. BNP (last 3 results) No results for input(s): PROBNP in the last 8760 hours. HbA1C: No results for input(s): HGBA1C in the last 72 hours. CBG:  Recent  Labs Lab 12/31/15 0735 12/31/15 0830 12/31/15 0947 12/31/15 1157 12/31/15 1724  GLUCAP 70 62* 122* 88 102*   Lipid Profile: No results for input(s): CHOL, HDL, LDLCALC, TRIG, CHOLHDL, LDLDIRECT in the last 72 hours. Thyroid Function Tests: No results for input(s): TSH, T4TOTAL, FREET4, T3FREE, THYROIDAB in the last 72 hours. Anemia Panel: No results for input(s): VITAMINB12, FOLATE, FERRITIN, TIBC, IRON, RETICCTPCT in the last 72 hours. Urine analysis:    Component  Value Date/Time   COLORURINE AMBER (A) 12/31/2015 0515   APPEARANCEUR CLOUDY (A) 12/31/2015 0515   LABSPEC 1.016 12/31/2015 0515   PHURINE 7.0 12/31/2015 0515   GLUCOSEU NEGATIVE 12/31/2015 0515   HGBUR NEGATIVE 12/31/2015 0515   BILIRUBINUR SMALL (A) 12/31/2015 0515   KETONESUR 15 (A) 12/31/2015 0515   PROTEINUR 100 (A) 12/31/2015 0515   UROBILINOGEN 2.0 (H) 08/15/2013 1916   NITRITE NEGATIVE 12/31/2015 0515   LEUKOCYTESUR NEGATIVE 12/31/2015 0515   Sepsis Labs: '@LABRCNTIP'$ (procalcitonin:4,lacticidven:4)  ) Recent Results (from the past 240 hour(s))  Culture, blood (Routine x 2)     Status: None (Preliminary result)   Collection Time: 12/28/15  6:14 PM  Result Value Ref Range Status   Specimen Description BLOOD RIGHT ANTECUBITAL  Final   Special Requests BOTTLES DRAWN AEROBIC AND ANAEROBIC 5CC  Final   Culture  Setup Time   Final    GRAM VARIABLE ROD AEROBIC BOTTLE ONLY CRITICAL RESULT CALLED TO, READ BACK BY AND VERIFIED WITHKarlene Einstein PHARMD AT 9476 12/31/15 BY D. VANHOOK    Culture GRAM VARIABLE ROD  Final   Report Status PENDING  Incomplete  Blood Culture ID Panel (Reflexed)     Status: None   Collection Time: 12/28/15  6:14 PM  Result Value Ref Range Status   Enterococcus species NOT DETECTED NOT DETECTED Final   Vancomycin resistance NOT DETECTED NOT DETECTED Final   Listeria monocytogenes NOT DETECTED NOT DETECTED Final   Staphylococcus species NOT DETECTED NOT DETECTED Final    Staphylococcus aureus NOT DETECTED NOT DETECTED Final   Methicillin resistance NOT DETECTED NOT DETECTED Final   Streptococcus species NOT DETECTED NOT DETECTED Final   Streptococcus agalactiae NOT DETECTED NOT DETECTED Final   Streptococcus pneumoniae NOT DETECTED NOT DETECTED Final   Streptococcus pyogenes NOT DETECTED NOT DETECTED Final   Acinetobacter baumannii NOT DETECTED NOT DETECTED Final   Enterobacteriaceae species NOT DETECTED NOT DETECTED Final   Enterobacter cloacae complex NOT DETECTED NOT DETECTED Final   Escherichia coli NOT DETECTED NOT DETECTED Final   Klebsiella oxytoca NOT DETECTED NOT DETECTED Final   Klebsiella pneumoniae NOT DETECTED NOT DETECTED Final   Proteus species NOT DETECTED NOT DETECTED Final   Serratia marcescens NOT DETECTED NOT DETECTED Final   Carbapenem resistance NOT DETECTED NOT DETECTED Final   Haemophilus influenzae NOT DETECTED NOT DETECTED Final   Neisseria meningitidis NOT DETECTED NOT DETECTED Final   Pseudomonas aeruginosa NOT DETECTED NOT DETECTED Final   Candida albicans NOT DETECTED NOT DETECTED Final   Candida glabrata NOT DETECTED NOT DETECTED Final   Candida krusei NOT DETECTED NOT DETECTED Final   Candida parapsilosis NOT DETECTED NOT DETECTED Final   Candida tropicalis NOT DETECTED NOT DETECTED Final  Blood Culture (routine x 2)     Status: None (Preliminary result)   Collection Time: 12/30/15  4:02 AM  Result Value Ref Range Status   Specimen Description BLOOD RIGHT ARM  Final   Special Requests BOTTLES DRAWN AEROBIC AND ANAEROBIC 5ML  Final   Culture NO GROWTH 1 DAY  Final   Report Status PENDING  Incomplete  Blood Culture (routine x 2)     Status: None (Preliminary result)   Collection Time: 12/30/15  4:18 AM  Result Value Ref Range Status   Specimen Description BLOOD RIGHT HAND  Final   Special Requests IN PEDIATRIC BOTTLE 3ML  Final   Culture NO GROWTH 1 DAY  Final   Report Status PENDING  Incomplete  Radiology  Studies: Ct Chest Wo Contrast  Result Date: 12/30/2015 CLINICAL DATA:  Status post right lung transplant, lethargy, sepsis, acute respiratory failure, evaluate for pneumonia EXAM: CT CHEST WITHOUT CONTRAST TECHNIQUE: Multidetector CT imaging of the chest was performed following the standard protocol without IV contrast. COMPARISON:  CTA chest dated 11/11/2012 FINDINGS: Cardiovascular: Heart is normal in size. No pericardial effusion. Leftward cardiomediastinal shift. Three vessel coronary atherosclerosis. Atherosclerotic calcifications of the aortic arch. Mediastinum/Nodes: Small mediastinal lymph nodes which do not meet pathologic CT size criteria. Visualized thyroid is unremarkable. Lungs/Pleura: Chronic interstitial lung disease/ fibrosis with volume loss in the left lung. Status post right lung transplant. Mild patchy opacities in the right lower lobe and to a lesser extent the posterior right upper lobe, suspicious for pneumonia. Small right pleural effusion. No pneumothorax. Upper Abdomen: Visualized upper abdomen is notable for two nonobstructing upper pole calculi measuring up to 5 mm. No hydronephrosis. Musculoskeletal: Mild degenerative changes of the visualized thoracolumbar spine. IMPRESSION: Status post right lung transplant. Mild patchy opacities in the posterior right upper lobe and right lower lobe, suspicious for pneumonia. Small right pleural effusion. Chronic interstitial lung disease/fibrosis with volume loss in the left lung. Electronically Signed   By: Julian Hy M.D.   On: 12/30/2015 20:03  Dg Chest Port 1 View  Result Date: 12/30/2015 CLINICAL DATA:  65 year old male with cough. History of lung transplant. EXAM: PORTABLE CHEST 1 VIEW COMPARISON:  Chest radiograph dated 12/28/2015 FINDINGS: There is postsurgical changes of right lung transplant with multiple surgical clips at the right hilum. Right lung base pleural thickening and parenchymal scarring appears similar to prior  study. There is diffuse interstitial coarsening at of left lung compatible with underlying fibrosis or interstitial lung disease. No focal consolidation or pneumothorax. The cardiac silhouette is within normal limits. No acute osseous pathology. IMPRESSION: No interval change since the prior study. Stable postsurgical changes of right lung transplant and right lung base pleural parenchymal thickening/ scarring. Chronic left lung interstitial changes/fibrosis. Electronically Signed   By: Anner Crete M.D.   On: 12/30/2015 04:03    Scheduled Meds: . azaTHIOprine  50 mg Oral Daily  . ceFEPime (MAXIPIME) IV  2 g Intravenous Q M,W,F-1800  . clotrimazole  10 mg Oral 5 X Daily  . cycloSPORINE modified  100 mg Oral QHS  . cycloSPORINE modified  75 mg Oral q morning - 10a  . [START ON 01/01/2016] famotidine  20 mg Oral Daily  . heparin  5,000 Units Subcutaneous Q8H  . predniSONE  5 mg Oral Q breakfast  . sodium chloride flush  3 mL Intravenous Q12H  . [START ON 01/01/2016] sulfamethoxazole-trimethoprim  1 tablet Oral Once per day on Mon Wed Fri   Continuous Infusions:    LOS: 1 day    Time spent: 30 min    Janece Canterbury, MD [facility] Pager [phone]  If 7PM-7AM, please contact night-coverage [email or web address] Password [facility] 12/31/2015, 5:56 PM

## 2015-12-31 NOTE — Progress Notes (Signed)
PHARMACY - PHYSICIAN COMMUNICATION CRITICAL VALUE ALERT - BLOOD CULTURE IDENTIFICATION (BCID)  Results for orders placed or performed during the hospital encounter of 12/28/15  Blood Culture ID Panel (Reflexed) (Collected: 12/28/2015  6:14 PM)  Result Value Ref Range   Enterococcus species NOT DETECTED NOT DETECTED   Vancomycin resistance NOT DETECTED NOT DETECTED   Listeria monocytogenes NOT DETECTED NOT DETECTED   Staphylococcus species NOT DETECTED NOT DETECTED   Staphylococcus aureus NOT DETECTED NOT DETECTED   Methicillin resistance NOT DETECTED NOT DETECTED   Streptococcus species NOT DETECTED NOT DETECTED   Streptococcus agalactiae NOT DETECTED NOT DETECTED   Streptococcus pneumoniae NOT DETECTED NOT DETECTED   Streptococcus pyogenes NOT DETECTED NOT DETECTED   Acinetobacter baumannii NOT DETECTED NOT DETECTED   Enterobacteriaceae species NOT DETECTED NOT DETECTED   Enterobacter cloacae complex NOT DETECTED NOT DETECTED   Escherichia coli NOT DETECTED NOT DETECTED   Klebsiella oxytoca NOT DETECTED NOT DETECTED   Klebsiella pneumoniae NOT DETECTED NOT DETECTED   Proteus species NOT DETECTED NOT DETECTED   Serratia marcescens NOT DETECTED NOT DETECTED   Carbapenem resistance NOT DETECTED NOT DETECTED   Haemophilus influenzae NOT DETECTED NOT DETECTED   Neisseria meningitidis NOT DETECTED NOT DETECTED   Pseudomonas aeruginosa NOT DETECTED NOT DETECTED   Candida albicans NOT DETECTED NOT DETECTED   Candida glabrata NOT DETECTED NOT DETECTED   Candida krusei NOT DETECTED NOT DETECTED   Candida parapsilosis NOT DETECTED NOT DETECTED   Candida tropicalis NOT DETECTED NOT DETECTED    Name of physician (or Provider) Contacted: M. Short  GVRs in 1/2 aerobic bottles. Could be possible contamination. Currently on vancomycin and cefepime for possible HAP. After discussion with MD, will continue current abx for treatment of HAP.   Reginia Naas 12/31/2015  10:03 AM

## 2016-01-01 ENCOUNTER — Inpatient Hospital Stay (HOSPITAL_COMMUNITY): Payer: Medicare Other

## 2016-01-01 DIAGNOSIS — N186 End stage renal disease: Secondary | ICD-10-CM

## 2016-01-01 DIAGNOSIS — N185 Chronic kidney disease, stage 5: Secondary | ICD-10-CM

## 2016-01-01 DIAGNOSIS — R4189 Other symptoms and signs involving cognitive functions and awareness: Secondary | ICD-10-CM | POA: Diagnosis not present

## 2016-01-01 DIAGNOSIS — D631 Anemia in chronic kidney disease: Secondary | ICD-10-CM | POA: Diagnosis present

## 2016-01-01 DIAGNOSIS — Z79899 Other long term (current) drug therapy: Secondary | ICD-10-CM | POA: Diagnosis not present

## 2016-01-01 DIAGNOSIS — T86819 Unspecified complication of lung transplant: Secondary | ICD-10-CM | POA: Diagnosis not present

## 2016-01-01 DIAGNOSIS — Z4682 Encounter for fitting and adjustment of non-vascular catheter: Secondary | ICD-10-CM | POA: Diagnosis not present

## 2016-01-01 DIAGNOSIS — E2740 Unspecified adrenocortical insufficiency: Secondary | ICD-10-CM | POA: Diagnosis not present

## 2016-01-01 DIAGNOSIS — I10 Essential (primary) hypertension: Secondary | ICD-10-CM | POA: Diagnosis not present

## 2016-01-01 DIAGNOSIS — N170 Acute kidney failure with tubular necrosis: Secondary | ICD-10-CM | POA: Diagnosis present

## 2016-01-01 DIAGNOSIS — Z992 Dependence on renal dialysis: Secondary | ICD-10-CM | POA: Diagnosis not present

## 2016-01-01 DIAGNOSIS — Z8711 Personal history of peptic ulcer disease: Secondary | ICD-10-CM | POA: Diagnosis not present

## 2016-01-01 DIAGNOSIS — E785 Hyperlipidemia, unspecified: Secondary | ICD-10-CM | POA: Diagnosis present

## 2016-01-01 DIAGNOSIS — B259 Cytomegaloviral disease, unspecified: Secondary | ICD-10-CM

## 2016-01-01 DIAGNOSIS — R918 Other nonspecific abnormal finding of lung field: Secondary | ICD-10-CM | POA: Diagnosis not present

## 2016-01-01 DIAGNOSIS — B488 Other specified mycoses: Secondary | ICD-10-CM

## 2016-01-01 DIAGNOSIS — E8770 Fluid overload, unspecified: Secondary | ICD-10-CM | POA: Diagnosis present

## 2016-01-01 DIAGNOSIS — Z794 Long term (current) use of insulin: Secondary | ICD-10-CM | POA: Diagnosis not present

## 2016-01-01 DIAGNOSIS — Z4824 Encounter for aftercare following lung transplant: Secondary | ICD-10-CM | POA: Diagnosis not present

## 2016-01-01 DIAGNOSIS — T86812 Lung transplant infection: Secondary | ICD-10-CM | POA: Diagnosis present

## 2016-01-01 DIAGNOSIS — T86810 Lung transplant rejection: Secondary | ICD-10-CM | POA: Diagnosis not present

## 2016-01-01 DIAGNOSIS — J849 Interstitial pulmonary disease, unspecified: Secondary | ICD-10-CM | POA: Diagnosis not present

## 2016-01-01 DIAGNOSIS — E1122 Type 2 diabetes mellitus with diabetic chronic kidney disease: Secondary | ICD-10-CM | POA: Diagnosis present

## 2016-01-01 DIAGNOSIS — J189 Pneumonia, unspecified organism: Secondary | ICD-10-CM

## 2016-01-01 DIAGNOSIS — I517 Cardiomegaly: Secondary | ICD-10-CM | POA: Diagnosis not present

## 2016-01-01 DIAGNOSIS — N179 Acute kidney failure, unspecified: Secondary | ICD-10-CM | POA: Diagnosis not present

## 2016-01-01 DIAGNOSIS — J80 Acute respiratory distress syndrome: Secondary | ICD-10-CM

## 2016-01-01 DIAGNOSIS — R0902 Hypoxemia: Secondary | ICD-10-CM | POA: Diagnosis not present

## 2016-01-01 DIAGNOSIS — J9601 Acute respiratory failure with hypoxia: Secondary | ICD-10-CM | POA: Diagnosis not present

## 2016-01-01 DIAGNOSIS — Z942 Lung transplant status: Secondary | ICD-10-CM | POA: Diagnosis not present

## 2016-01-01 DIAGNOSIS — E119 Type 2 diabetes mellitus without complications: Secondary | ICD-10-CM | POA: Diagnosis not present

## 2016-01-01 DIAGNOSIS — G4733 Obstructive sleep apnea (adult) (pediatric): Secondary | ICD-10-CM | POA: Diagnosis not present

## 2016-01-01 DIAGNOSIS — R1312 Dysphagia, oropharyngeal phase: Secondary | ICD-10-CM | POA: Diagnosis present

## 2016-01-01 DIAGNOSIS — D899 Disorder involving the immune mechanism, unspecified: Secondary | ICD-10-CM | POA: Diagnosis not present

## 2016-01-01 DIAGNOSIS — A419 Sepsis, unspecified organism: Principal | ICD-10-CM

## 2016-01-01 DIAGNOSIS — N189 Chronic kidney disease, unspecified: Secondary | ICD-10-CM

## 2016-01-01 DIAGNOSIS — K219 Gastro-esophageal reflux disease without esophagitis: Secondary | ICD-10-CM | POA: Diagnosis present

## 2016-01-01 DIAGNOSIS — J90 Pleural effusion, not elsewhere classified: Secondary | ICD-10-CM | POA: Diagnosis not present

## 2016-01-01 DIAGNOSIS — I120 Hypertensive chronic kidney disease with stage 5 chronic kidney disease or end stage renal disease: Secondary | ICD-10-CM | POA: Diagnosis present

## 2016-01-01 LAB — CBC WITH DIFFERENTIAL/PLATELET
BASOS PCT: 1 %
Basophils Absolute: 0 10*3/uL (ref 0.0–0.1)
Eosinophils Absolute: 0.1 10*3/uL (ref 0.0–0.7)
Eosinophils Relative: 1 %
HEMATOCRIT: 36.9 % — AB (ref 39.0–52.0)
HEMOGLOBIN: 12.4 g/dL — AB (ref 13.0–17.0)
LYMPHS ABS: 0.8 10*3/uL (ref 0.7–4.0)
Lymphocytes Relative: 11 %
MCH: 35.4 pg — AB (ref 26.0–34.0)
MCHC: 33.6 g/dL (ref 30.0–36.0)
MCV: 105.4 fL — ABNORMAL HIGH (ref 78.0–100.0)
MONO ABS: 0.4 10*3/uL (ref 0.1–1.0)
MONOS PCT: 6 %
NEUTROS ABS: 6.3 10*3/uL (ref 1.7–7.7)
NEUTROS PCT: 81 %
Platelets: 188 10*3/uL (ref 150–400)
RBC: 3.5 MIL/uL — ABNORMAL LOW (ref 4.22–5.81)
RDW: 17.1 % — AB (ref 11.5–15.5)
WBC: 7.7 10*3/uL (ref 4.0–10.5)

## 2016-01-01 LAB — CULTURE, BLOOD (ROUTINE X 2)

## 2016-01-01 LAB — POCT I-STAT 3, ART BLOOD GAS (G3+)
Acid-Base Excess: 2 mmol/L (ref 0.0–2.0)
BICARBONATE: 27.1 meq/L — AB (ref 20.0–24.0)
BICARBONATE: 24.7 meq/L — AB (ref 20.0–24.0)
O2 SAT: 94 %
O2 Saturation: 100 %
PH ART: 7.381 (ref 7.350–7.450)
TCO2: 28 mmol/L (ref 0–100)
TCO2: 26 mmol/L (ref 0–100)
pCO2 arterial: 45.2 mmHg — ABNORMAL HIGH (ref 35.0–45.0)
pCO2 arterial: 42.4 mmHg (ref 35.0–45.0)
pH, Arterial: 7.393 (ref 7.350–7.450)
pO2, Arterial: 215 mmHg — ABNORMAL HIGH (ref 80.0–100.0)
pO2, Arterial: 78 mmHg — ABNORMAL LOW (ref 80.0–100.0)

## 2016-01-01 LAB — CBC
HCT: 36.6 % — ABNORMAL LOW (ref 39.0–52.0)
Hemoglobin: 12.5 g/dL — ABNORMAL LOW (ref 13.0–17.0)
MCH: 35.5 pg — AB (ref 26.0–34.0)
MCHC: 34.2 g/dL (ref 30.0–36.0)
MCV: 104 fL — AB (ref 78.0–100.0)
PLATELETS: 186 10*3/uL (ref 150–400)
RBC: 3.52 MIL/uL — AB (ref 4.22–5.81)
RDW: 17.2 % — ABNORMAL HIGH (ref 11.5–15.5)
WBC: 6.9 10*3/uL (ref 4.0–10.5)

## 2016-01-01 LAB — LACTIC ACID, PLASMA
Lactic Acid, Venous: 1.4 mmol/L (ref 0.5–1.9)
Lactic Acid, Venous: 1.4 mmol/L (ref 0.5–1.9)

## 2016-01-01 LAB — HEPATITIS B SURFACE ANTIGEN: HEP B S AG: NEGATIVE

## 2016-01-01 LAB — BLOOD GAS, ARTERIAL
Acid-Base Excess: 2.4 mmol/L — ABNORMAL HIGH (ref 0.0–2.0)
Bicarbonate: 26 mEq/L — ABNORMAL HIGH (ref 20.0–24.0)
Drawn by: 280981
FIO2: 1
O2 Saturation: 91.1 %
Patient temperature: 98.6
TCO2: 27.1 mmol/L (ref 0–100)
pCO2 arterial: 37.2 mmHg (ref 35.0–45.0)
pH, Arterial: 7.458 — ABNORMAL HIGH (ref 7.350–7.450)
pO2, Arterial: 62.4 mmHg — ABNORMAL LOW (ref 80.0–100.0)

## 2016-01-01 LAB — BASIC METABOLIC PANEL
Anion gap: 14 (ref 5–15)
BUN: 41 mg/dL — ABNORMAL HIGH (ref 6–20)
CHLORIDE: 94 mmol/L — AB (ref 101–111)
CO2: 25 mmol/L (ref 22–32)
CREATININE: 5.98 mg/dL — AB (ref 0.61–1.24)
Calcium: 8.2 mg/dL — ABNORMAL LOW (ref 8.9–10.3)
GFR calc non Af Amer: 9 mL/min — ABNORMAL LOW (ref >60)
GFR, EST AFRICAN AMERICAN: 10 mL/min — AB (ref >60)
Glucose, Bld: 80 mg/dL (ref 65–99)
Potassium: 4.4 mmol/L (ref 3.5–5.1)
Sodium: 133 mmol/L — ABNORMAL LOW (ref 135–145)

## 2016-01-01 LAB — GLUCOSE, CAPILLARY
GLUCOSE-CAPILLARY: 82 mg/dL (ref 65–99)
Glucose-Capillary: 82 mg/dL (ref 65–99)
Glucose-Capillary: 83 mg/dL (ref 65–99)
Glucose-Capillary: 122 mg/dL — ABNORMAL HIGH (ref 65–99)

## 2016-01-01 LAB — TROPONIN I
Troponin I: 0.04 ng/mL (ref <0.03)
Troponin I: <0.03 ng/mL (ref <0.03)

## 2016-01-01 LAB — MRSA PCR SCREENING: MRSA by PCR: NEGATIVE

## 2016-01-01 LAB — CRYPTOCOCCAL ANTIGEN: Crypto Ag: NEGATIVE

## 2016-01-01 MED ORDER — SULFAMETHOXAZOLE-TRIMETHOPRIM 400-80 MG/5ML IV SOLN
7.5000 mg/kg/d | INTRAVENOUS | Status: DC
  Filled 2016-01-01 (×2): qty 26

## 2016-01-01 MED ORDER — LIDOCAINE HCL (PF) 1 % IJ SOLN
5.0000 mL | INTRAMUSCULAR | Status: DC | PRN
  Filled 2016-01-01: qty 5

## 2016-01-01 MED ORDER — MIDAZOLAM HCL 2 MG/2ML IJ SOLN
INTRAMUSCULAR | Status: AC
  Administered 2016-01-01: 2 mg
  Filled 2016-01-01: qty 2

## 2016-01-01 MED ORDER — CHLORHEXIDINE GLUCONATE 0.12% ORAL RINSE (MEDLINE KIT)
15.0000 mL | Freq: Two times a day (BID) | OROMUCOSAL | Status: DC
  Administered 2016-01-01: 15 mL via OROMUCOSAL

## 2016-01-01 MED ORDER — SODIUM CHLORIDE 0.9 % IV SOLN
25.0000 ug/h | INTRAVENOUS | Status: DC
  Filled 2016-01-01: qty 50

## 2016-01-01 MED ORDER — ANTISEPTIC ORAL RINSE SOLUTION (CORINZ)
7.0000 mL | Freq: Four times a day (QID) | OROMUCOSAL | Status: DC
  Administered 2016-01-01: 7 mL via OROMUCOSAL

## 2016-01-01 MED ORDER — CALCIUM ACETATE (PHOS BINDER) 667 MG PO CAPS
667.0000 mg | ORAL_CAPSULE | Freq: Three times a day (TID) | ORAL | Status: DC
  Administered 2016-01-01: 667 mg via ORAL
  Filled 2016-01-01 (×3): qty 1

## 2016-01-01 MED ORDER — VANCOMYCIN HCL 500 MG IV SOLR
500.0000 mg | Freq: Once | INTRAVENOUS | Status: AC
  Administered 2016-01-01: 500 mg via INTRAVENOUS
  Filled 2016-01-01: qty 500

## 2016-01-01 MED ORDER — DEXTROSE 5 % IV SOLN
7.5000 mg/kg/d | INTRAVENOUS | Status: DC
  Administered 2016-01-01: 416 mg via INTRAVENOUS
  Filled 2016-01-01: qty 26

## 2016-01-01 MED ORDER — HEPARIN SODIUM (PORCINE) 1000 UNIT/ML DIALYSIS
1000.0000 [IU] | INTRAMUSCULAR | Status: DC | PRN
  Filled 2016-01-01: qty 1

## 2016-01-01 MED ORDER — RENA-VITE PO TABS
1.0000 | ORAL_TABLET | Freq: Every day | ORAL | Status: DC
  Filled 2016-01-01: qty 1

## 2016-01-01 MED ORDER — ALTEPLASE 2 MG IJ SOLR: 2.0000 mg | Freq: Once | INTRAMUSCULAR | Status: DC | PRN

## 2016-01-01 MED ORDER — SODIUM CHLORIDE 0.9 % IV SOLN: 100.0000 mL | INTRAVENOUS | Status: DC | PRN

## 2016-01-01 MED ORDER — ACETAMINOPHEN 325 MG PO TABS
ORAL_TABLET | ORAL | Status: AC
  Filled 2016-01-01: qty 1

## 2016-01-01 MED ORDER — HEPARIN SODIUM (PORCINE) 1000 UNIT/ML DIALYSIS: 1800.0000 [IU] | Freq: Once | INTRAMUSCULAR | Status: DC

## 2016-01-01 MED ORDER — SODIUM CHLORIDE 0.9 % IV SOLN
6.0000 mg/kg | Freq: Two times a day (BID) | INTRAVENOUS | Status: DC
  Administered 2016-01-01: 330 mg via INTRAVENOUS
  Filled 2016-01-01 (×2): qty 330

## 2016-01-01 MED ORDER — VANCOMYCIN HCL 500 MG IV SOLR: 500.0000 mg | INTRAVENOUS | Status: DC

## 2016-01-01 MED ORDER — ACETAMINOPHEN 325 MG PO TABS
ORAL_TABLET | ORAL | Status: AC
  Filled 2016-01-01: qty 2

## 2016-01-01 MED ORDER — MIDAZOLAM HCL 2 MG/2ML IJ SOLN: 1.0000 mg | INTRAMUSCULAR | Status: DC | PRN

## 2016-01-01 MED ORDER — MIDAZOLAM HCL 2 MG/2ML IJ SOLN
1.0000 mg | INTRAMUSCULAR | Status: DC | PRN
  Administered 2016-01-01: 1 mg via INTRAVENOUS
  Filled 2016-01-01: qty 2

## 2016-01-01 MED ORDER — CYCLOSPORINE MODIFIED (NEORAL) 25 MG PO CAPS
25.0000 mg | ORAL_CAPSULE | Freq: Every day | ORAL | Status: DC
  Filled 2016-01-01: qty 1

## 2016-01-01 MED ORDER — VANCOMYCIN HCL 500 MG IV SOLR
500.0000 mg | Freq: Once | INTRAVENOUS | Status: DC
  Filled 2016-01-01 (×2): qty 500

## 2016-01-01 MED ORDER — FENTANYL CITRATE (PF) 100 MCG/2ML IJ SOLN: 50.0000 ug | Freq: Once | INTRAMUSCULAR | Status: AC

## 2016-01-01 MED ORDER — METHYLPREDNISOLONE SODIUM SUCC 125 MG IJ SOLR
60.0000 mg | Freq: Two times a day (BID) | INTRAMUSCULAR | Status: DC
  Administered 2016-01-01 (×2): 60 mg via INTRAVENOUS
  Filled 2016-01-01 (×2): qty 2

## 2016-01-01 MED ORDER — VORICONAZOLE 200 MG IV SOLR: 4.0000 mg/kg | Freq: Two times a day (BID) | INTRAVENOUS | Status: DC

## 2016-01-01 MED ORDER — PENTAFLUOROPROP-TETRAFLUOROETH EX AERO: 1.0000 "application " | INHALATION_SPRAY | CUTANEOUS | Status: DC | PRN

## 2016-01-01 MED ORDER — CYCLOSPORINE MODIFIED (NEORAL) 25 MG PO CAPS: 50.0000 mg | ORAL_CAPSULE | Freq: Every morning | ORAL | Status: DC

## 2016-01-01 MED ORDER — SODIUM CHLORIDE 0.9 % IV SOLN: 300.0000 mg | INTRAVENOUS | Status: DC

## 2016-01-01 MED ORDER — FENTANYL BOLUS VIA INFUSION
25.0000 ug | INTRAVENOUS | Status: DC | PRN
  Filled 2016-01-01: qty 25

## 2016-01-01 MED ORDER — DOXERCALCIFEROL 4 MCG/2ML IV SOLN
INTRAVENOUS | Status: AC
  Filled 2016-01-01: qty 2

## 2016-01-01 MED ORDER — PHENYLEPHRINE HCL 10 MG/ML IJ SOLN
0.0000 ug/min | INTRAVENOUS | Status: DC
  Filled 2016-01-01: qty 1

## 2016-01-01 MED ORDER — DOXERCALCIFEROL 4 MCG/2ML IV SOLN
1.0000 ug | INTRAVENOUS | Status: DC
  Administered 2016-01-01: 1 ug via INTRAVENOUS

## 2016-01-01 MED ORDER — SODIUM CHLORIDE 0.9 % IV SOLN
250.0000 mg | Freq: Two times a day (BID) | INTRAVENOUS | Status: DC
  Administered 2016-01-01: 250 mg via INTRAVENOUS
  Filled 2016-01-01 (×2): qty 250

## 2016-01-01 MED ORDER — CYCLOSPORINE MODIFIED (NEORAL) 25 MG PO CAPS: 25.0000 mg | ORAL_CAPSULE | Freq: Every morning | ORAL | Status: DC

## 2016-01-01 MED ORDER — VORICONAZOLE 200 MG IV SOLR
6.0000 mg/kg | Freq: Two times a day (BID) | INTRAVENOUS | Status: DC
  Filled 2016-01-01 (×2): qty 330

## 2016-01-01 MED ORDER — LIDOCAINE-PRILOCAINE 2.5-2.5 % EX CREA
1.0000 "application " | TOPICAL_CREAM | CUTANEOUS | Status: DC | PRN
  Filled 2016-01-01: qty 5

## 2016-01-01 MED ORDER — FENTANYL CITRATE (PF) 100 MCG/2ML IJ SOLN
INTRAMUSCULAR | Status: AC
  Administered 2016-01-01: 50 ug
  Filled 2016-01-01: qty 2

## 2016-01-01 MED ORDER — DEXTROSE 5 % IV SOLN: 500.0000 mg | INTRAVENOUS | Status: DC

## 2016-01-01 MED ORDER — AZITHROMYCIN 500 MG PO TABS
500.0000 mg | ORAL_TABLET | Freq: Every day | ORAL | Status: DC
  Administered 2016-01-01: 500 mg via ORAL
  Filled 2016-01-01: qty 1

## 2016-01-01 NOTE — Care Management Important Message (Signed)
Important Message  Patient Details  Name: [name] MRN: [id number] Date of Birth: [birth year]   Medicare Important Message Given:  Yes    Loann Quill 01/01/2016, 10:57 AM

## 2016-01-01 NOTE — Progress Notes (Signed)
[facility] for Infectious Disease   Reason for visit: Follow up on hypoxia  Interval History: currently has been dypsneic, on NRB; wife at bedside   Physical Exam: Constitutional:  Vitals:   01/01/16 1418 01/01/16 1420  BP: (!) 152/73 139/81  Pulse: (!) 114 (!) 111  Resp: (!) 34 (!) 30  Temp:     patient appears in respiratory distress Eyes: anicteric HENT: no thrush Respiratory: respiratory distress Cardiovascular: Tachy RR GI: soft, nt, nd  Review of Systems: Constitutional: negative for fevers and chills Respiratory: positive for dyspnea on exertion, negative for cough Gastrointestinal: negative for diarrhea  Lab Results  Component Value Date   WBC 6.9 01/01/2016   HGB 12.5 (L) 01/01/2016   HCT 36.6 (L) 01/01/2016   MCV 104.0 (H) 01/01/2016   PLT 186 01/01/2016    Lab Results  Component Value Date   CREATININE 5.98 (H) 01/01/2016   BUN 41 (H) 01/01/2016   NA 133 (L) 01/01/2016   K 4.4 01/01/2016   CL 94 (L) 01/01/2016   CO2 25 01/01/2016    Lab Results  Component Value Date   ALT 31 12/30/2015   AST 51 (H) 12/30/2015   ALKPHOS 116 12/30/2015     Microbiology: Recent Results (from the past 240 hour(s))  Culture, blood (Routine x 2)     Status: Abnormal   Collection Time: 12/28/15  6:14 PM  Result Value Ref Range Status   Specimen Description BLOOD RIGHT ANTECUBITAL  Final   Special Requests BOTTLES DRAWN AEROBIC AND ANAEROBIC 5CC  Final   Culture  Setup Time   Final    GRAM VARIABLE ROD AEROBIC BOTTLE ONLY CRITICAL RESULT CALLED TO, READ BACK BY AND VERIFIED WITH: Karlene Einstein PHARMD AT 7902 12/31/15 BY D. VANHOOK    Culture (A)  Final    DIPHTHEROIDS(CORYNEBACTERIUM SPECIES) Standardized susceptibility testing for this organism is not available.    Report Status 01/01/2016 FINAL  Final  Blood Culture ID Panel (Reflexed)     Status: None   Collection Time: 12/28/15  6:14 PM  Result Value Ref Range Status   Enterococcus species NOT  DETECTED NOT DETECTED Final   Vancomycin resistance NOT DETECTED NOT DETECTED Final   Listeria monocytogenes NOT DETECTED NOT DETECTED Final   Staphylococcus species NOT DETECTED NOT DETECTED Final   Staphylococcus aureus NOT DETECTED NOT DETECTED Final   Methicillin resistance NOT DETECTED NOT DETECTED Final   Streptococcus species NOT DETECTED NOT DETECTED Final   Streptococcus agalactiae NOT DETECTED NOT DETECTED Final   Streptococcus pneumoniae NOT DETECTED NOT DETECTED Final   Streptococcus pyogenes NOT DETECTED NOT DETECTED Final   Acinetobacter baumannii NOT DETECTED NOT DETECTED Final   Enterobacteriaceae species NOT DETECTED NOT DETECTED Final   Enterobacter cloacae complex NOT DETECTED NOT DETECTED Final   Escherichia coli NOT DETECTED NOT DETECTED Final   Klebsiella oxytoca NOT DETECTED NOT DETECTED Final   Klebsiella pneumoniae NOT DETECTED NOT DETECTED Final   Proteus species NOT DETECTED NOT DETECTED Final   Serratia marcescens NOT DETECTED NOT DETECTED Final   Carbapenem resistance NOT DETECTED NOT DETECTED Final   Haemophilus influenzae NOT DETECTED NOT DETECTED Final   Neisseria meningitidis NOT DETECTED NOT DETECTED Final   Pseudomonas aeruginosa NOT DETECTED NOT DETECTED Final   Candida albicans NOT DETECTED NOT DETECTED Final   Candida glabrata NOT DETECTED NOT DETECTED Final   Candida krusei NOT DETECTED NOT DETECTED Final   Candida parapsilosis NOT DETECTED NOT DETECTED Final  Candida tropicalis NOT DETECTED NOT DETECTED Final  Blood Culture (routine x 2)     Status: None (Preliminary result)   Collection Time: 12/30/15  4:02 AM  Result Value Ref Range Status   Specimen Description BLOOD RIGHT ARM  Final   Special Requests BOTTLES DRAWN AEROBIC AND ANAEROBIC 5ML  Final   Culture NO GROWTH 1 DAY  Final   Report Status PENDING  Incomplete  Blood Culture (routine x 2)     Status: None (Preliminary result)   Collection Time: 12/30/15  4:18 AM  Result Value Ref  Range Status   Specimen Description BLOOD RIGHT HAND  Final   Special Requests IN PEDIATRIC BOTTLE 3ML  Final   Culture NO GROWTH 1 DAY  Final   Report Status PENDING  Incomplete  MRSA PCR Screening     Status: None   Collection Time: 01/01/16 11:49 AM  Result Value Ref Range Status   MRSA by PCR NEGATIVE NEGATIVE Final    Comment:        The GeneXpert MRSA Assay (FDA approved for NASAL specimens only), is one component of a comprehensive MRSA colonization surveillance program. It is not intended to diagnose MRSA infection nor to guide or monitor treatment for MRSA infections.     Impression/Plan:  1. Respiratory distress - unknown etiology.  Concern for infection.  Azithromycin started for possible atypicals.  Possible PCP pneumonia, though was on prophylaxis.  Will start Bactrim.   Broaden to imipenem May need BAL if possible for fungal, AFB, bacterial, nocardia, PCP, cmv, aspergillus.   Respiratory panel Sputums ordered but not obtained  2. ESRD - on dialysis.    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  Will defer this for now  4.  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  Will continue it here.

## 2016-01-01 NOTE — Progress Notes (Signed)
[facility] Progress Note  Assessment/Plan: 1. Lethargy fever- Tmax 99.3 multifocal PNA per CT -1/2 BC gram var rods - nothing on PCR; now on bactrim, azithro and Maxipime fever curve improving, WBC stable wnl 2. ESRD - MWF - HD today K 4.4 - changed to 2 K bath 3. Anemia - no ESA hgb >12 4. Secondary hyperparathyroidism - continue hectorol P 5.5 - resume phoslo 1 ac 5. HTN/volume - controlled- BP lower here than outpt - has been leaving a little below EDW in outpt unit- unable to UF volume today due to BP drop; at EDW by bed weight; Net UF Wed was 500 with post weight 54.6 6. Nutrition - renal diet/ added vitamins 7. S/p right lung transplant - awaiting transfer to [facility] - no beds available yet (home Tx meds are imuran 50/ d, CyA 100am and 75 pm and pred 5/d per pulm Tx visit on Jun 15/17 at [facility]) 8. Volume - is at dry wt  Myriam Jacobson, PA-C [place] 01/01/2016,9:52 AM  LOS: 2 days   Pt seen, examined and agree w A/P as above.  Kelly Splinter MD [facility] pager [phone]    cell [phone] 01/01/2016, 12:18 PM    Subjective:   Vomited breakfast yesterday but kept down some lunch  Objective Vitals:   01/01/16 0825 01/01/16 0830 01/01/16 0856 01/01/16 0925  BP: 116/74 137/77 120/68 118/77  Pulse: 80 82 83 85  Resp: 18 (!) '22 18 19  '$ Temp:      TempSrc:      SpO2: 91% 91% 92% 92%  Weight:      Height:       Physical Exam pre HD wt 55.2 General: ill appearing, denies SOB but seems sob when talking on HD, feet elevated Heart: RRR Lungs: dim BS on left, right soft scattered short exp wheeze Abdomen: soft NTND + BS Extremities:no LE edema Dialysis Access: left upper AVF  Dialysis Orders: [facility] MWF 4h 55kg Hep 1800 3K/ 2.25 bath  Hect 1 ug Mircera 50 ug last 7/12 Home meds > Imuran 150/d, asa, insulin, prilosec, statin, pred 17.5 mg/d, Zoloft, Bactrim mwf after HD MWF, Cyclosporine 100 mg qam and 75 mg  qpm,Valcyte 450 on Mon + Fri after HD  Additional Objective Labs: Basic Metabolic Panel:  Recent Labs Lab 12/30/15 0435 12/30/15 1053 12/30/15 1347 12/31/15 0334 01/01/16 0525  NA 136  --   --  134* 133*  K 5.0  --   --  3.9 4.4  CL 98*  --   --  96* 94*  CO2 22  --   --  26 25  GLUCOSE 74  --   --  65 80  BUN 32*  --   --  21* 41*  CREATININE 5.19* 5.57*  --  3.83* 5.98*  CALCIUM 8.2*  --   --  8.1* 8.2*  PHOS  --   --  5.5*  --   --    Liver Function Tests:  Recent Labs Lab 12/28/15 1813 12/30/15 0435  AST 60* 51*  ALT 41 31  ALKPHOS 123 116  BILITOT 1.1 1.3*  PROT 6.5 6.0*  ALBUMIN 3.4* 3.1*   CBC:  Recent Labs Lab 12/28/15 1813 12/30/15 0435 12/31/15 0334 01/01/16 0525  WBC 5.0 6.0 5.9 6.9  NEUTROABS 3.8 4.6  --   --   HGB 12.5* 12.8* 12.6* 12.5*  HCT 37.8* 39.6 38.4* 36.6*  MCV 105.3* 107.3* 106.4* 104.0*  PLT 204 134* 157 186   Blood Culture    Component Value Date/Time   SDES BLOOD RIGHT HAND 12/30/2015 0418   SPECREQUEST IN PEDIATRIC BOTTLE 3ML 12/30/2015 0418   CULT NO GROWTH 1 DAY 12/30/2015 0418   REPTSTATUS PENDING 12/30/2015 0418    CBG:  Recent Labs Lab 12/31/15 0830 12/31/15 0947 12/31/15 1157 12/31/15 1724 12/31/15 2319  GLUCAP 62* 122* 88 102* 107*   Iron Studies: No results for input(s): IRON, TIBC, TRANSFERRIN, FERRITIN in the last 72 hours. Lab Results  Component Value Date   INR 1.04 12/30/2015   INR 1.19 04/18/2015   INR 0.92 04/03/2009   Studies/Results: Ct Chest Wo Contrast  Result Date: 12/30/2015 CLINICAL DATA:  Status post right lung transplant, lethargy, sepsis, acute respiratory failure, evaluate for pneumonia EXAM: CT CHEST WITHOUT CONTRAST TECHNIQUE: Multidetector CT imaging of the chest was performed following the standard protocol without IV contrast. COMPARISON:  CTA chest dated 11/11/2012 FINDINGS: Cardiovascular: Heart is normal in size. No pericardial effusion. Leftward cardiomediastinal shift. Three  vessel coronary atherosclerosis. Atherosclerotic calcifications of the aortic arch. Mediastinum/Nodes: Small mediastinal lymph nodes which do not meet pathologic CT size criteria. Visualized thyroid is unremarkable. Lungs/Pleura: Chronic interstitial lung disease/ fibrosis with volume loss in the left lung. Status post right lung transplant. Mild patchy opacities in the right lower lobe and to a lesser extent the posterior right upper lobe, suspicious for pneumonia. Small right pleural effusion. No pneumothorax. Upper Abdomen: Visualized upper abdomen is notable for two nonobstructing upper pole calculi measuring up to 5 mm. No hydronephrosis. Musculoskeletal: Mild degenerative changes of the visualized thoracolumbar spine. IMPRESSION: Status post right lung transplant. Mild patchy opacities in the posterior right upper lobe and right lower lobe, suspicious for pneumonia. Small right pleural effusion. Chronic interstitial lung disease/fibrosis with volume loss in the left lung. Electronically Signed   By: Julian Hy M.D.   On: 12/30/2015 20:03  Medications:   . acetaminophen      . azaTHIOprine  50 mg Oral Daily  . azithromycin  500 mg Oral Daily  . ceFEPime (MAXIPIME) IV  2 g Intravenous Q M,W,F-1800  . clotrimazole  10 mg Oral 5 X Daily  . cycloSPORINE modified  100 mg Oral QHS  . cycloSPORINE modified  75 mg Oral q morning - 10a  . famotidine  20 mg Oral Daily  . [START ON 01/02/2016] heparin  1,800 Units Dialysis Once in dialysis  . heparin  5,000 Units Subcutaneous Q8H  . predniSONE  15 mg Oral Q breakfast  . sodium chloride flush  3 mL Intravenous Q12H  . sulfamethoxazole-trimethoprim  1 tablet Oral Once per day on Mon Wed Fri

## 2016-01-01 NOTE — Consult Note (Signed)
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 At the time of admission, the patient was sleepy minimally participatory in providing history. Patient reportedly fell asleep quickly and has been feeling weak and overall tired.  Only other complaint is intermittent right-sided headache.   He reported cough productive of clear sputum to ER MD.  He also denied fevers, nausea, vomiting, abdominal pain, dysuria, rash, and chest pains.  Due to temperature 100.45F, tachycardia, and tachypnea, there was concern for sepsis and he was started on vancomycin and Zosyn was given a 1 L normal saline bolus.  [facility] was unable to accept patient at this time due to high census and admission was deferred to [facility]. He was admitted to the medical ward, treated w/ abx, ID and nephro were consulted for supportive care. Since admit his O2 requirements have been progressively increased and we were operating on the working dx of presumed pulmonary infection in an immunocompromised host. On 7/28 he completed HD, already requiring escalating O2 during the course of the day. PCCM was consulted on 7/28 for progressive hypoxic respiratory failure.    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  PAST SURGICAL HISTORY: He  has a past surgical history that includes Lung biopsy (2010) and Lung transplant, single (Right).  Allergies  Allergen Reactions  . Levofloxacin Other (See Comments)    LOSS OF CONSCIOUSNESS  . Nsaids Other (See Comments)    Patient instructed not to take NSAID's after his lung transplant    No current facility-administered medications on file prior to encounter.    Current Outpatient Prescriptions on File Prior to Encounter  Medication Sig  . aspirin 81 MG tablet Take 81 mg by mouth daily.   Marland Kitchen azaTHIOprine (IMURAN) 50 MG tablet take 3 tablets by mouth once daily (Patient taking differently: Take 50 mg by mouth once daily)  . cycloSPORINE modified (NEORAL) 25 MG capsule Take 25-50 mg by mouth See admin instructions. 50 mg in the morning and 25 mg at bedtime  . fluticasone (FLONASE) 50 MCG/ACT nasal spray Place 2 sprays into the nose daily. (Patient taking differently: Place 2 sprays into the nose daily as needed for allergies. )  . Multiple Vitamin (MULTIVITAMIN WITH MINERALS) TABS tablet Take 1 tablet by mouth daily.  Marland Kitchen omeprazole (PRILOSEC) 20 MG capsule Take 20 mg by mouth daily.  . pravastatin (PRAVACHOL) 20 MG tablet Take 20 mg by mouth every evening.   . predniSONE (DELTASONE) 5 MG tablet Take 3.5 tablets daily as directed (Patient taking differently: Take 5 mg by mouth daily  with breakfast. )  . sertraline (ZOLOFT) 50 MG tablet Take 50 mg by mouth daily.  Marland Kitchen sulfamethoxazole-trimethoprim (BACTRIM DS,SEPTRA DS) 800-160 MG per tablet Take 1 tablet by mouth daily. (Patient  taking differently: Take 1 tablet by mouth every Monday, Wednesday, and Friday. )  . acetaminophen (TYLENOL) 160 MG/5ML solution Place 20.3 mLs (650 mg total) into feeding tube every 6 (six) hours as needed for mild pain, headache or fever. (Patient not taking: Reported on 12/31/2015)  . azithromycin 500 mg in dextrose 5 % 250 mL Inject 500 mg into the vein daily. (Patient not taking: Reported on 08/21/2015)  . insulin aspart (NOVOLOG) 100 UNIT/ML injection Inject 2-6 Units into the skin every 4 (four) hours. (Patient not taking: Reported on 12/31/2015)  . valGANciclovir (VALCYTE) 450 MG tablet Take 1 tablet by mouth See admin instructions. Every Monday and Friday AFTER DIALYSIS    FAMILY HISTORY:  His indicated that the status of his father is unknown. He indicated that the status of his brother is unknown.    SOCIAL HISTORY: He  reports that he quit smoking about 13 years ago. His smoking use included Cigarettes. He has a 6.00 pack-year smoking history. He has never used smokeless tobacco. He reports that he does not drink alcohol or use drugs.  REVIEW OF SYSTEMS:   Unable   SUBJECTIVE:  Lethargic and hypoxic   VITAL SIGNS: BP 139/81   Pulse (!) 111   Temp 97.6 F (36.4 C) (Oral)   Resp (!) 30   Ht 5' 3.5" (1.613 m)   Wt 122 lb 2.2 oz (55.4 kg)   SpO2 100%   BMI 21.30 kg/m   HEMODYNAMICS:    VENTILATOR SETTINGS:    INTAKE / OUTPUT: I/O last 3 completed shifts: In: 540 [P.O.:540] Out: 100 [Urine:100]  PHYSICAL EXAMINATION: General:  Chronically ill appearing AAM, currently on 100% NRB + accessory use  Neuro:  Opens eyes to speech, oriented x 2, moves all extremities  HEENT:  NCAT, no JVD.  Cardiovascular:  RRR, no MRG  Lungs:  Diffuse rhonchi, + tactile frem. + accessory use  Abdomen:  Soft, not tender + bowel sounds  Musculoskeletal:  Equal st and bulk Skin:   Warm and dry brisk CR   LABS:  BMET  Recent Labs Lab 12/30/15 0435 12/30/15 1053 12/31/15 0334  01/01/16 0525  NA 136  --  134* 133*  K 5.0  --  3.9 4.4  CL 98*  --  96* 94*  CO2 22  --  26 25  BUN 32*  --  21* 41*  CREATININE 5.19* 5.57* 3.83* 5.98*  GLUCOSE 74  --  65 80    Electrolytes  Recent Labs Lab 12/30/15 0435 12/30/15 1347 12/31/15 0334 01/01/16 0525  CALCIUM 8.2*  --  8.1* 8.2*  PHOS  --  5.5*  --   --     CBC  Recent Labs Lab 12/30/15 0435 12/31/15 0334 01/01/16 0525  WBC 6.0 5.9 6.9  HGB 12.8* 12.6* 12.5*  HCT 39.6 38.4* 36.6*  PLT 134* 157 186    Coag's  Recent Labs Lab 12/30/15 0652  INR 1.04    Sepsis Markers  Recent Labs Lab 12/28/15 1834 12/30/15 0446 12/30/15 0642  LATICACIDVEN 1.30 2.09* 1.67    ABG  Recent Labs Lab 12/30/15 0841 01/01/16 1428  PHART 7.368 7.458*  PCO2ART 39.6 37.2  PO2ART 65.0* 62.4*    Liver Enzymes  Recent Labs Lab 12/28/15 1813 12/30/15 0435  AST 60*  51*  ALT 41 31  ALKPHOS 123 116  BILITOT 1.1 1.3*  ALBUMIN 3.4* 3.1*    Cardiac Enzymes No results for input(s): TROPONINI, PROBNP in the last 168 hours.  Glucose  Recent Labs Lab 12/31/15 0947 12/31/15 1157 12/31/15 1724 12/31/15 2319 01/01/16 1139 01/01/16 1401  GLUCAP 122* 88 102* 107* 82 83    Imaging No results found.   STUDIES:  CT chest 7/26: Status post right lung transplant. Mild patchy opacities in the posterior right upper lobe and right lower lobe, suspicious for pneumonia. Small right pleural effusion. Chronic interstitial lung disease/fibrosis with volume loss in the left lung. ECHO 7/28>>>  CULTURES: mrsa 7/28: negative  BCX2 7/26>>> BAL 7/28>>> Fungal 7/28>>> pcp 7/28>>> Cytology 7/28>>> afb 7/28>>> Histo 7/28>>> aspergillous 7/28>>> CMV 7/28>>>   ANTIBIOTICS: azithro (chronic) Imipenem 7/28>>> Bactrim 7/26 vanc 7/25>>> VFEND 7/28>>>  SIGNIFICANT EVENTS:   LINES/TUBES:   DISCUSSION: 65 year old male w/ prior lung transplant 2016 for ILD. Co-morbids complicated by ESRD and DM.  Admitted 7/26 by IM service w/ working dx of presumed PNA in an immunosuppressed host. PCCM asked to see on 7/28 when he developed progressive respiratory failure and worsening pulmonary infiltrates. He was transferred to the ICU where the plan is now to intubate and get FOB, also r/o the potential for cardiac etiology as a potential reason for his clinical decline.   ASSESSMENT / PLAN:  PULMONARY A: Acute Hypoxic Respiratory failure in setting of bilateral pulmonary infiltrates-->likely infectious  Prior lung transplant for ILD  P:   Intubate/ventilate Needs FOB See ID section  Start PAD protocol  Cont solumedrol '60mg'$  bid  CARDIOVASCULAR A:  At risk for decompensation   P:  Tele Cycle CEs ECHo Ck lactic acid   RENAL A:   ESRD (MWF) P:   Renal following.   GASTROINTESTINAL A:   GERD P:   Cont PPI  HEMATOLOGIC A:   Anemia of chronic illness  P:  [place] heparin  Serial cbc Transfuse per protocol   INFECTIOUS A:   Chronic Immunosuppression  Possible opportunistic infection vs PNA   P:   ID following; abx per above Will need bronch   ENDOCRINE A:   secondary hyperparathyroidism  Chronic steroids  P:   Trend chemistry  Steroids as above   NEUROLOGIC A:   Acute Encephalopathy-->? Hypoxia vs infection vs metabolic  P:   RASS goal: -1 to -2 PAD protocol    FAMILY  - Updates:   - Inter-disciplinary family meet or Palliative Care meeting due by: 8/4    Erick Colace ACNP-BC [place] Pager # [phone] OR # [phone] if no answer   01/01/2016, 2:52 PM  ATTENDING NOTE / ATTESTATION NOTE :   I have discussed the case with the resident/APP  Salvadore Dom  I agree with the resident/APP's  history, physical examination, assessment, and plans.    I have edited the above note and modified it according to our agreed history, physical examination, assessment and plan.   Briefly, pt is a lung transplant pt in 10/2014 (R lung) at  [facility] for ILD  Had complicated course post transplant, although no rejection has been documented. Had AKI/CKD post transplant.  Was admitted for 4 mos then eventually discharged.  Since that time, he has been admitted 2x for infection related to temporary HD cath.  Current admission is related to fever, letahrgy, feeling unwell. Pts chest ct scan with possible new RML and RLL infiltrate (new from previous at  [facility] I think).  ID service is involved. TH has been in touch with [facility] Dr. Sheffield Slider re: the case and transfer but [facility] has been on diversion.   Pt was seen in resp distress, with accessory muscle use. BP 140 sys, 120, rr30, 100% on 110%. Able to lay flat. (-) NVD. (-) oral thrush. Wheeze on BLF, more on the right, Rhonchi R mid-lower LF. Tachycardic. (-) edema.   Main consideration is PNA in Max.  Less likely volume overload casuing SOB.  Differential is rejection but not the main consideration now.   Abx have been broadened to include atypicals and PCP. 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 Cont medrol at current dose. Cont other meds. Cont HD.   I discussed the case with [place] ICU attending today. He was OK with current management.  [facility] is on diversion and may have an ICU bed in am. Told our RN to follow up on beds at [facility].    Bronchoscopy done (pls see sep report) with possible mass in R mainstem. Not clear. May need a rpt bronch in am.   I have spent 30  minutes of critical care time with this patient today.  Family : Wife updated at bedside.   Monica Becton, MD 01/01/2016, 7:28 PM [facility] Pager [phone] After 3 pm or if no answer, call [phone]

## 2016-01-01 NOTE — Progress Notes (Addendum)
PROGRESS NOTE  [name]  OVZ:[id number] DOB: 1950-12-19 DOA: 12/30/2015 PCP: Wenda Low, MD  Brief Narrative:    [name] is a 65 y.o. 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 At the time of admission, the patient was sleepy minimally participatory in providing history. Patient reportedly fell asleep quickly and has been feeling weak and overall tired.  Only other complaint is intermittent right-sided headache.  Although his chief complaint was listed as SOB, he denied SOB to the admitting physician.  He reported cough productive of clear sputum to ER MD.  He also denied fevers, nausea, vomiting, abdominal pain, dysuria, rash, and chest pains.  Due to temperature 100.10F, tachycardia, and tachypnea, there was concern for sepsis and he was started on vancomycin and Zosyn was given a 1 L normal saline bolus.  [facility] was unable to accept patient at this time due to high census and admission was deferred to [facility].    7/28:  Rapid decline and hypoxia post HD.  Transfer to ICU for further care.    Assessment & Plan:   Active Problems:   Obstructive sleep apnea   Pulmonary fibrosis, postinflammatory (HCC)   ACID REFLUX DISEASE   Diabetes mellitus with complication (HCC)   Acute on chronic respiratory failure with hypoxia (HCC)   Essential hypertension   Acute respiratory failure (HCC)   ESRD (end stage renal disease) on dialysis (HCC)   Generalized weakness  Acute respiratory failure likely due to HCAP of the right lung.  CT of the chest demonstrated a stable small right pleural effusion and a multifocal pneumonia of the right lung which was new compared to prior.  He has been on prophylactic Bactrim, Valcyte, and treatment doses of posicanazole prior to admission.  At baseline patient  does not require O2, but he required 2-3L on 7/27 and was increased to NRB acutely after hemodialysis on 7/28.  PCCM was consulted.  DDX includes resistant bacterial infection, PCP despite compliance with bactrim, pulmonary edema due to weight loss without change in dry weight.  Screening for CMV infection.  Consider work up for ischemia, PE.  May need bronchoscopy and biopsy for tissue diagnosis.    - ID and PCCM assistance apprecaited - Vancomycin resumed per PCCM request on 7/28 -  D/c Cefepime and start primaxin on 7/28 -  D/c oral bactrim and start IV treatment dose of bactrim on 7/28 -  Start IV posiconazole 7/28 -  CMV PCR ordered on 7/28 -  Azithromycin started on 7/28 per [facility] request -  Unable to remove volume at dialysis due to hypotension -  BCX 1 of 2 on admission contaminated, others no growth to date -  Transfer to ICU on 7/28 -  Patient is on wait list at [facility] for transfer to transplant team when bed available:  [phone]  Hx of lung transplant with possible infection -  Continue imuran and cyclosporine -  Cyclosporine dose on 7/28 reflects HIGH dose of cyclosporine and would anticipate level will be somewhat elevated.  Dose has already been reduced to home dose of '50mg'$  qAM and '25mg'$  qPM per pharmacy -  D/c oral prednisone and start IV solumedrol for now  Headache, may be related to underlying viral infection  ESRD: dialysis MWF. Appreciate nephrology assistance - dialysis per nephro  DM, with  recurrent hypoglycemia which may be due to relative adrenal insufficiency in setting of infection -  D/c SSI due to hypoglycemia -  Increased steroids  GERD, stable.  PPI  HTN:  hydralazine PRN  Sore throat, may have early thrush.  D/c troches since resuming systemic antifungal  DVT prophylaxis:  heparin Code Status:  full Family Communication:  Patient alone Disposition Plan:  Pending improvement in clinical condition   Consultants:   ID   PCCM  Procedures:    none  Antimicrobials:   Vancomycin 7/26 > 7/27, 7/28 >  Cefepime 7/26 > 7/28  Primaxin 7/28 >  IV bactrim 7/28 >   IV posiconazole 7/28 >    Subjective: Ongoing headaches   Objective: Vitals:   01/01/16 1416 01/01/16 1417 01/01/16 1418 01/01/16 1420  BP:   (!) 152/73 139/81  Pulse:   (!) 114 (!) 111  Resp:   (!) 34 (!) 30  Temp:      TempSrc:      SpO2: (!) 82% (!) 78% 96% 100%  Weight:      Height:        Intake/Output Summary (Last 24 hours) at 01/01/16 1457 Last data filed at 01/01/16 1030  Gross per 24 hour  Intake                0 ml  Output             -394 ml  Net              394 ml   Filed Weights   12/30/15 1815 01/01/16 0650 01/01/16 1030  Weight: 54.6 kg (120 lb 5.9 oz) 55.2 kg (121 lb 11.1 oz) 55.4 kg (122 lb 2.2 oz)    Examination:  General exam:  Adult male, sleepier than yesterday, tachypneic HEENT:  NCAT, MMM Respiratory system: rales throughout right lung fields, wheezes throughout, no rhonchi Cardiovascular system:  Tachycardic, regular rhythm, no rubs, gallops or clicks.  Warm extremities Gastrointestinal system:  Hyperactive bowel sounds, soft, nondistended, nontender. MSK:  Normal tone and bulk, no lower extremity edema Neuro:  Grossly moves all extremities, occasional twitching of extremities    Data Reviewed: I have personally reviewed following labs and imaging studies  CBC:  Recent Labs Lab 12/28/15 1813 12/30/15 0435 12/31/15 0334 01/01/16 0525  WBC 5.0 6.0 5.9 6.9  NEUTROABS 3.8 4.6  --   --   HGB 12.5* 12.8* 12.6* 12.5*  HCT 37.8* 39.6 38.4* 36.6*  MCV 105.3* 107.3* 106.4* 104.0*  PLT 204 134* 157 297   Basic Metabolic Panel:  Recent Labs Lab 12/28/15 1813 12/30/15 0435 12/30/15 1053 12/30/15 1347 12/31/15 0334 01/01/16 0525  NA 136 136  --   --  134* 133*  K 4.6 5.0  --   --  3.9 4.4  CL 97* 98*  --   --  96* 94*  CO2 24 22  --   --  26 25  GLUCOSE 149* 74  --   --  65 80  BUN 66* 32*  --   --  21*  41*  CREATININE 8.07* 5.19* 5.57*  --  3.83* 5.98*  CALCIUM 9.2 8.2*  --   --  8.1* 8.2*  PHOS  --   --   --  5.5*  --   --    GFR: Estimated Creatinine Clearance: 9.5 mL/min (by C-G formula based on SCr of 5.98 mg/dL). Liver Function Tests:  Recent Labs Lab 12/28/15 1813 12/30/15 0435  AST 60* 51*  ALT 41 31  ALKPHOS 123 116  BILITOT 1.1 1.3*  PROT 6.5 6.0*  ALBUMIN 3.4* 3.1*   No results for input(s): LIPASE, AMYLASE in the last 168 hours. No results for input(s): AMMONIA in the last 168 hours. Coagulation Profile:  Recent Labs Lab 12/30/15 0652  INR 1.04   Cardiac Enzymes: No results for input(s): CKTOTAL, CKMB, CKMBINDEX, TROPONINI in the last 168 hours. BNP (last 3 results) No results for input(s): PROBNP in the last 8760 hours. HbA1C: No results for input(s): HGBA1C in the last 72 hours. CBG:  Recent Labs Lab 12/31/15 1157 12/31/15 1724 12/31/15 2319 01/01/16 1139 01/01/16 1401  GLUCAP 88 102* 107* 82 83   Lipid Profile: No results for input(s): CHOL, HDL, LDLCALC, TRIG, CHOLHDL, LDLDIRECT in the last 72 hours. Thyroid Function Tests: No results for input(s): TSH, T4TOTAL, FREET4, T3FREE, THYROIDAB in the last 72 hours. Anemia Panel: No results for input(s): VITAMINB12, FOLATE, FERRITIN, TIBC, IRON, RETICCTPCT in the last 72 hours. Urine analysis:    Component Value Date/Time   COLORURINE AMBER (A) 12/31/2015 0515   APPEARANCEUR CLOUDY (A) 12/31/2015 0515   LABSPEC 1.016 12/31/2015 0515   PHURINE 7.0 12/31/2015 0515   GLUCOSEU NEGATIVE 12/31/2015 0515   HGBUR NEGATIVE 12/31/2015 0515   BILIRUBINUR SMALL (A) 12/31/2015 0515   KETONESUR 15 (A) 12/31/2015 0515   PROTEINUR 100 (A) 12/31/2015 0515   UROBILINOGEN 2.0 (H) 08/15/2013 1916   NITRITE NEGATIVE 12/31/2015 0515   LEUKOCYTESUR NEGATIVE 12/31/2015 0515   Sepsis Labs: '@LABRCNTIP'$ (procalcitonin:4,lacticidven:4)  ) Recent Results (from the past 240 hour(s))  Culture, blood (Routine x 2)      Status: Abnormal   Collection Time: 12/28/15  6:14 PM  Result Value Ref Range Status   Specimen Description BLOOD RIGHT ANTECUBITAL  Final   Special Requests BOTTLES DRAWN AEROBIC AND ANAEROBIC 5CC  Final   Culture  Setup Time   Final    GRAM VARIABLE ROD AEROBIC BOTTLE ONLY CRITICAL RESULT CALLED TO, READ BACK BY AND VERIFIED WITH: Karlene Einstein PHARMD AT 9562 12/31/15 BY D. VANHOOK    Culture (A)  Final    DIPHTHEROIDS(CORYNEBACTERIUM SPECIES) Standardized susceptibility testing for this organism is not available.    Report Status 01/01/2016 FINAL  Final  Blood Culture ID Panel (Reflexed)     Status: None   Collection Time: 12/28/15  6:14 PM  Result Value Ref Range Status   Enterococcus species NOT DETECTED NOT DETECTED Final   Vancomycin resistance NOT DETECTED NOT DETECTED Final   Listeria monocytogenes NOT DETECTED NOT DETECTED Final   Staphylococcus species NOT DETECTED NOT DETECTED Final   Staphylococcus aureus NOT DETECTED NOT DETECTED Final   Methicillin resistance NOT DETECTED NOT DETECTED Final   Streptococcus species NOT DETECTED NOT DETECTED Final   Streptococcus agalactiae NOT DETECTED NOT DETECTED Final   Streptococcus pneumoniae NOT DETECTED NOT DETECTED Final   Streptococcus pyogenes NOT DETECTED NOT DETECTED Final   Acinetobacter baumannii NOT DETECTED NOT DETECTED Final   Enterobacteriaceae species NOT DETECTED NOT DETECTED Final   Enterobacter cloacae complex NOT DETECTED NOT DETECTED Final   Escherichia coli NOT DETECTED NOT DETECTED Final   Klebsiella oxytoca NOT DETECTED NOT DETECTED Final   Klebsiella pneumoniae NOT DETECTED NOT DETECTED Final   Proteus species NOT DETECTED NOT DETECTED Final   Serratia marcescens NOT DETECTED NOT DETECTED Final   Carbapenem resistance NOT DETECTED NOT DETECTED Final   Haemophilus influenzae NOT DETECTED NOT DETECTED Final   Neisseria meningitidis  NOT DETECTED NOT DETECTED Final   Pseudomonas aeruginosa NOT DETECTED NOT  DETECTED Final   Candida albicans NOT DETECTED NOT DETECTED Final   Candida glabrata NOT DETECTED NOT DETECTED Final   Candida krusei NOT DETECTED NOT DETECTED Final   Candida parapsilosis NOT DETECTED NOT DETECTED Final   Candida tropicalis NOT DETECTED NOT DETECTED Final  Blood Culture (routine x 2)     Status: None (Preliminary result)   Collection Time: 12/30/15  4:02 AM  Result Value Ref Range Status   Specimen Description BLOOD RIGHT ARM  Final   Special Requests BOTTLES DRAWN AEROBIC AND ANAEROBIC 5ML  Final   Culture NO GROWTH 1 DAY  Final   Report Status PENDING  Incomplete  Blood Culture (routine x 2)     Status: None (Preliminary result)   Collection Time: 12/30/15  4:18 AM  Result Value Ref Range Status   Specimen Description BLOOD RIGHT HAND  Final   Special Requests IN PEDIATRIC BOTTLE 3ML  Final   Culture NO GROWTH 1 DAY  Final   Report Status PENDING  Incomplete  MRSA PCR Screening     Status: None   Collection Time: 01/01/16 11:49 AM  Result Value Ref Range Status   MRSA by PCR NEGATIVE NEGATIVE Final    Comment:        The GeneXpert MRSA Assay (FDA approved for NASAL specimens only), is one component of a comprehensive MRSA colonization surveillance program. It is not intended to diagnose MRSA infection nor to guide or monitor treatment for MRSA infections.       Radiology Studies: Ct Chest Wo Contrast  Result Date: 12/30/2015 CLINICAL DATA:  Status post right lung transplant, lethargy, sepsis, acute respiratory failure, evaluate for pneumonia EXAM: CT CHEST WITHOUT CONTRAST TECHNIQUE: Multidetector CT imaging of the chest was performed following the standard protocol without IV contrast. COMPARISON:  CTA chest dated 11/11/2012 FINDINGS: Cardiovascular: Heart is normal in size. No pericardial effusion. Leftward cardiomediastinal shift. Three vessel coronary atherosclerosis. Atherosclerotic calcifications of the aortic arch. Mediastinum/Nodes: Small  mediastinal lymph nodes which do not meet pathologic CT size criteria. Visualized thyroid is unremarkable. Lungs/Pleura: Chronic interstitial lung disease/ fibrosis with volume loss in the left lung. Status post right lung transplant. Mild patchy opacities in the right lower lobe and to a lesser extent the posterior right upper lobe, suspicious for pneumonia. Small right pleural effusion. No pneumothorax. Upper Abdomen: Visualized upper abdomen is notable for two nonobstructing upper pole calculi measuring up to 5 mm. No hydronephrosis. Musculoskeletal: Mild degenerative changes of the visualized thoracolumbar spine. IMPRESSION: Status post right lung transplant. Mild patchy opacities in the posterior right upper lobe and right lower lobe, suspicious for pneumonia. Small right pleural effusion. Chronic interstitial lung disease/fibrosis with volume loss in the left lung. Electronically Signed   By: Julian Hy M.D.   On: 12/30/2015 20:03    Scheduled Meds: . acetaminophen      . azaTHIOprine  50 mg Oral Daily  . [START ON 01/02/2016] azithromycin  500 mg Intravenous Q24H  . calcium acetate  667 mg Oral TID WC  . cycloSPORINE modified  25 mg Oral QHS  . [START ON 01/02/2016] cycloSPORINE modified  50 mg Oral q morning - 10a  . doxercalciferol  1 mcg Intravenous Q M,W,F-HD  . heparin  5,000 Units Subcutaneous Q8H  . imipenem-cilastatin  250 mg Intravenous Q12H  . methylPREDNISolone (SOLU-MEDROL) injection  60 mg Intravenous BID  . multivitamin  1 tablet Oral QHS  .  posaconazole (NOXAFIL) IV  300 mg Intravenous Q24H  . sodium chloride flush  3 mL Intravenous Q12H  . sulfamethoxazole-trimethoprim  7.5 mg/kg/day Intravenous Q24H  . vancomycin  500 mg Intravenous Once  . [START ON 01/04/2016] vancomycin  500 mg Intravenous Q M,W,F-HD   Continuous Infusions:    LOS: 2 days    Time spent: 30 min    Janece Canterbury, MD [facility] Pager [phone]  If 7PM-7AM, please contact  night-coverage [email or web address] Password [facility] 01/01/2016, 2:57 PM

## 2016-01-01 NOTE — Progress Notes (Addendum)
Pt transferred via [facility] to [facility].  Vitals and assessment stable at transfer.  [facility] notified.  Jannette Spanner, RN  at [facility] receiving report.  Pts wife, Molli Posey, notified

## 2016-01-01 NOTE — Progress Notes (Signed)
Infectious Diseases Pharmacy Note:  Posaconazole not kept in stock so called ID and will change to voriconazole.  Recommendation is to also decrease the cyclosporine dose by 50% when using voriconazole. I decreased the morning dose from 50 mg to 25 mg in anticipation of drug interaction.  Pt is also ESRD on HD - not much data on using voriconazole in these patients.  It is recommended to switch to oral as soon as possible.  We will follow.  Plan: - Voriconazole 330 mg IV q12h x 2 - Then voriconazole 220 mg IV q12h - Change to oral as soon as clinically possible - Decrease AM cyclosporine dose to 25 mg daily  Cassie L. Nicole Kindred, PharmD Infectious Diseases Clinical Pharmacist Pager: [phone] 01/01/2016 3:18 PM

## 2016-01-01 NOTE — Progress Notes (Signed)
CRITICAL VALUE ALERT  Critical value received:  Trop 0.04  Date of notification:  01/01/16  Time of notification:  6184  Critical value read back:Yes.    Nurse who received alert:  Nettie Elm, RN   MD notified (1st page):  Dr. Larkin Ina  Time of first page:  53  MD notified (2nd page):  Time of second page:  Responding MD:  Dr. Larkin Ina  Time MD responded:  [phone]

## 2016-01-01 NOTE — Progress Notes (Signed)
Patient showed rapid decline in level of consciousness around 1:45. Vitals showed tachypnea and tachycardia. Patients oxygenation was 82 with 4L of O2. Patient continued to destat even after being place on 6L of O2 and a venturi mask. Short MD made aware. Patient placed on non-rebreather and finally got up 100%. Orders for transfer placed. RN called report to [facility] in 67M. Patient and wife transferred to 67M 05

## 2016-01-01 NOTE — Procedures (Signed)
Bronchoscopy Procedure Note [name] [id number] 1951-04-07  Procedure: Bronchoscopy Indications: Diagnostic evaluation of the airways and Obtain specimens for culture and/or other diagnostic studies  Procedure Details Consent: Risks of procedure as well as the alternatives and risks of each were explained to the (patient/caregiver).  Consent for procedure obtained. Time Out: Verified patient identification, verified procedure, site/side was marked, verified correct patient position, special equipment/implants available, medications/allergies/relevent history reviewed, required imaging and test results available.  Performed  In preparation for procedure, patient was given 100% FiO2 and bronchoscope lubricated. Sedation: Benzodiazepines 1 mg Versed,  100 mcg Fentanyl, 20 mg Etomidate, 50 mg Succinylcholine > these meds were given prior to intubation.   Airway entered and the following bronchi were examined: RUL, LUL, LLL and Bronchi.   On examining the R mainstem, I saw the wire/staple at 2 oclock position (S/P R LT). He had purulent secretions in R mainstem which I tried to suction.  There is a fleshy mass (?) at the R mainstem which was erythematous and a bit friable.  It was obstructing 80% of the airway. I tried passing through the mass but it was difficult and he started to bleed. I did not pursue, I then did BAL in R mainstem.  Left lung was examined. LUL/lingula looked N. Erythematous mucosa and some purulent secretions in LLL. Secretions were suctioned out. BAL done in LLL.   Procedures performed: BAL R mainstem and LLL Bronchoscope removed.  , Patient placed back on 100% FiO2 at conclusion of procedure.    Evaluation Hemodynamic Status: Transient hypotension treated with fluid; O2 sats: stable throughout Patient's Current Condition: stable Specimens:  Sent serosanguinous fluid Complications: No apparent complications Patient did tolerate procedure well.   Eudora 01/01/2016

## 2016-01-01 NOTE — Progress Notes (Signed)
Pharmacy Antibiotic Note  [name] is a 65 y.o. male admitted on 12/30/2015 with pneumonia.  Pharmacy has been consulted for primaxin and bactrim dosing.  Plan: Primaxin 250 mg q12h Bactrim 7.5 mg/kg q24h Vancomycin 500 mg iv once today, then 500 mg wHD Posaconazole per MD  Height: 5' 3.5" (161.3 cm) Weight: 122 lb 2.2 oz (55.4 kg) IBW/kg (Calculated) : 58.05  Temp (24hrs), Avg:98.6 F (37 C), Min:97.6 F (36.4 C), Max:99.5 F (37.5 C)   Recent Labs Lab 12/28/15 1813 12/28/15 1834 12/30/15 0435 12/30/15 0446 12/30/15 0642 12/30/15 1053 12/31/15 0334 01/01/16 0525  WBC 5.0  --  6.0  --   --   --  5.9 6.9  CREATININE 8.07*  --  5.19*  --   --  5.57* 3.83* 5.98*  LATICACIDVEN  --  1.30  --  2.09* 1.67  --   --   --     Estimated Creatinine Clearance: 9.5 mL/min (by C-G formula based on SCr of 5.98 mg/dL).    Allergies  Allergen Reactions  . Levofloxacin Other (See Comments)    LOSS OF CONSCIOUSNESS  . Nsaids Other (See Comments)    Patient instructed not to take NSAID's after his lung transplant    Antimicrobials this admission: Cefepime 7/26>>7/28 Vanc 7/26>> 7/27; 7/28 *loaded with 1g in ED, received 500 after HD 7/26 Zosyn 7/26 x1 Primaxin 7/28>> Bactrim 7/28>> Posaconazole 7/28>>  Levester Fresh, PharmD, BCPS, [facility] Clinical Pharmacist Pager [phone] 01/01/2016 2:49 PM

## 2016-01-01 NOTE — Discharge Summary (Signed)
Name: [name] Date of birth: 1950-07-22 Medical record number: [id number]  Admission date: 12/30/2015 Discharge date:   Admitting Diagnoses: Acute on chronic respiratory failure with hypoxia Acute metabolic encephalopathy Sepsis HCAP Diabetes mellitus End stage renal disease on hemodialysis Hypertension Pulmonary fibrosis S/p right lung transplant 2016 Gastroesophageal reflux disease  Discharge Diagnoses: Acute on chronic respiratory failure with hypoxia Acute metabolic encephalopathy Sepsis HCAP Diabetes mellitus End stage renal disease on hemodialysis Secondary hyperparathyroidism Anemia of chronic disease Hypertension Pulmonary fibrosis S/p right lung transplant 2016 Gastroesophageal reflux disease Thrush  Consults: Renal - Dr. Roney Jaffe Infectious disease - Dr. Scharlene Gloss  Procedures: Bronchoscopy 01/01/16  Past medical history: He  has a past medical history of Aortic valve disorders; Benign neoplasm of colon; Degeneration of intervertebral disc, site unspecified; Diabetes mellitus without complication ([place]); Diaphragmatic hernia without mention of obstruction or gangrene; Esophageal reflux; ESRD (end stage renal disease) on dialysis ([place]); Essential hypertension; Obstructive sleep apnea (adult) (pediatric); Osteoarthrosis, unspecified whether generalized or localized, unspecified site; Other and unspecified hyperlipidemia; Pneumonia; Pulmonary fibrosis ([place]); Renal disorder; Respiratory failure with hypoxia ([place]) (12/2015); Shortness of breath dyspnea; Transplanted, lung ([place]); and Unspecified essential hypertension.  Past surgical history: He  has a past surgical history that includes Lung biopsy (2010) and Lung transplant, single (Right).  Family history: His family history includes Heart disease in his father; Pancreatic cancer in his brother.  Social history: He  reports that he quit smoking about 13 years ago. His smoking use included  Cigarettes. He has a 6.00 pack-year smoking history. He has never used smokeless tobacco. He reports that he does not drink alcohol or use drugs.    Allergies  Allergen Reactions  . Levofloxacin Other (See Comments)    LOSS OF CONSCIOUSNESS  . Nsaids Other (See Comments)    Patient instructed not to take NSAID's after his lung transplant    Hospital course: 65 yr old male presented to ER with somnolence, headache, weakness, cough with clear sputum and fatigue for one week.  He had hypoxia.  There was concern for sepsis with pneumonia, and he was started on antibiotics.  Nephrology was consulted.  He received dialysis on 12/30/15.  He was also seen by infectious disease.  He had progressive dyspnea and hypoxia.  He was transferred to ICU on 01/01/16 and required intubation.  He had bronchoscopy performed.  Vital signs: BP 91/69   Pulse 97   Temp 97.6 F (36.4 C) (Oral)   Resp (!) 23   Ht 5' 3.5" (1.613 m)   Wt 55.4 kg (122 lb 2.2 oz)   SpO2 97%   BMI 21.30 kg/m   Physical exam: General:  Chronically ill appearing AAM, currently on 100% NRB + accessory use  Neuro:  Opens eyes to speech, oriented x 2, moves all extremities  HEENT:  NCAT, no JVD.  Cardiovascular:  RRR, no MRG  Lungs:  Diffuse rhonchi, + tactile frem. + accessory use  Abdomen:  Soft, not tender + bowel sounds  Musculoskeletal:  Equal st and bulk Skin:   Warm and dry brisk CR     Current Facility-Administered Medications:  .  acetaminophen (TYLENOL) tablet 650 mg, 650 mg, Oral, Q6H PRN, 650 mg at 01/01/16 0703 **OR** acetaminophen (TYLENOL) suppository 650 mg, 650 mg, Rectal, Q6H PRN, Waldemar Dickens, MD .  antiseptic oral rinse solution (CORINZ), 7 mL, Mouth Rinse, QID, Erick Colace, NP, 7 mL at 01/01/16 1755 .  azaTHIOprine (IMURAN) tablet 50 mg, 50 mg, Oral,  Daily, Roney Jaffe, MD, 50 mg at 01/01/16 1142 .  [START ON 01/02/2016] azithromycin (ZITHROMAX) 500 mg in dextrose 5 % 250 mL IVPB, 500 mg,  Intravenous, Q24H, Janece Canterbury, MD .  calcium acetate (PHOSLO) capsule 667 mg, 667 mg, Oral, TID WC, Alric Seton, PA-C, 667 mg at 01/01/16 1204 .  chlorhexidine gluconate (SAGE KIT) (PERIDEX) 0.12 % solution 15 mL, 15 mL, Mouth Rinse, BID, Erick Colace, NP, 15 mL at 01/01/16 2006 .  cycloSPORINE modified (NEORAL) capsule 25 mg, 25 mg, Oral, QHS, Janece Canterbury, MD .  Derrill Memo ON 01/02/2016] cycloSPORINE modified (NEORAL) capsule 25 mg, 25 mg, Oral, q morning - 10a, Thayer Headings, MD .  doxercalciferol (HECTOROL) injection 1 mcg, 1 mcg, Intravenous, Q M,W,F-HD, Alric Seton, PA-C, 1 mcg at 01/01/16 1003 .  fentaNYL (SUBLIMAZE) 2,500 mcg in sodium chloride 0.9 % 250 mL (10 mcg/mL) infusion, 25-400 mcg/hr, Intravenous, Continuous, Erick Colace, NP .  fentaNYL (SUBLIMAZE) bolus via infusion 25 mcg, 25 mcg, Intravenous, Q1H PRN, Erick Colace, NP .  heparin injection 5,000 Units, 5,000 Units, Subcutaneous, Q8H, Waldemar Dickens, MD, 5,000 Units at 01/01/16 1340 .  hydrALAZINE (APRESOLINE) injection 5-10 mg, 5-10 mg, Intravenous, Q4H PRN, Waldemar Dickens, MD .  imipenem-cilastatin (PRIMAXIN) 250 mg in sodium chloride 0.9 % 100 mL IVPB, 250 mg, Intravenous, Q12H, Wynell Balloon, RPH, 250 mg at 01/01/16 1833 .  methylPREDNISolone sodium succinate (SOLU-MEDROL) 125 mg/2 mL injection 60 mg, 60 mg, Intravenous, BID, Janece Canterbury, MD, 60 mg at 01/01/16 1708 .  midazolam (VERSED) injection 1 mg, 1 mg, Intravenous, Q15 min PRN, Erick Colace, NP, 1 mg at 01/01/16 2006 .  midazolam (VERSED) injection 1 mg, 1 mg, Intravenous, Q2H PRN, Erick Colace, NP .  multivitamin (RENA-VIT) tablet 1 tablet, 1 tablet, Oral, QHS, Alric Seton, PA-C .  ondansetron (ZOFRAN) tablet 4 mg, 4 mg, Oral, Q6H PRN **OR** ondansetron (ZOFRAN) injection 4 mg, 4 mg, Intravenous, Q6H PRN, Waldemar Dickens, MD, 4 mg at 12/30/15 2338 .  phenylephrine (NEO-SYNEPHRINE) 10 mg in dextrose 5 % 250 mL (0.04 mg/mL) infusion,  0-400 mcg/min, Intravenous, Titrated, Jose Angelo A [place], MD .  sodium chloride flush (NS) 0.9 % injection 3 mL, 3 mL, Intravenous, Q12H, Waldemar Dickens, MD, 3 mL at 12/31/15 2302 .  sulfamethoxazole-trimethoprim (BACTRIM) 416 mg in dextrose 5 % 500 mL IVPB, 7.5 mg/kg/day, Intravenous, Q24H, Jose Angelo A [place], MD .  Derrill Memo ON 01/04/2016] vancomycin (VANCOCIN) 500 mg in sodium chloride 0.9 % 100 mL IVPB, 500 mg, Intravenous, Q M,W,F-HD, Wynell Balloon, RPH .  vancomycin (VANCOCIN) 500 mg in sodium chloride 0.9 % 100 mL IVPB, 500 mg, Intravenous, Once, [employer], MD, 500 mg at 01/01/16 2030 .  voriconazole (VFEND) 330 mg in sodium chloride 0.9 % 100 mL IVPB, 6 mg/kg, Intravenous, Q12H, 330 mg at 01/01/16 1708 **FOLLOWED BY** [START ON 01/02/2016] voriconazole (VFEND) 220 mg in sodium chloride 0.9 % 100 mL IVPB, 4 mg/kg, Intravenous, Q12H, Janece Canterbury, MD   CMP Latest Ref Rng & Units 01/01/2016 12/31/2015 12/30/2015  Glucose 65 - 99 mg/dL 80 65 -  BUN 6 - 20 mg/dL 41(H) 21(H) -  Creatinine 0.61 - 1.24 mg/dL 5.98(H) 3.83(H) 5.57(H)  Sodium 135 - 145 mmol/L 133(L) 134(L) -  Potassium 3.5 - 5.1 mmol/L 4.4 3.9 -  Chloride 101 - 111 mmol/L 94(L) 96(L) -  CO2 22 - 32 mmol/L 25 26 -  Calcium  8.9 - 10.3 mg/dL 8.2(L) 8.1(L) -  Total Protein 6.5 - 8.1 g/dL - - -  Total Bilirubin 0.3 - 1.2 mg/dL - - -  Alkaline Phos 38 - 126 U/L - - -  AST 15 - 41 U/L - - -  ALT 17 - 63 U/L - - -   CBC Latest Ref Rng & Units 01/01/2016 01/01/2016 12/31/2015  WBC 4.0 - 10.5 K/uL 7.7 6.9 5.9  Hemoglobin 13.0 - 17.0 g/dL 12.4(L) 12.5(L) 12.6(L)  Hematocrit 39.0 - 52.0 % 36.9(L) 36.6(L) 38.4(L)  Platelets 150 - 400 K/uL 188 186 157    ABG    Component Value Date/Time   PHART 7.381 01/01/2016 1843   PCO2ART 42.4 01/01/2016 1843   PO2ART 78.0 (L) 01/01/2016 1843   HCO3 24.7 (H) 01/01/2016 1843   TCO2 26 01/01/2016 1843   ACIDBASEDEF 2.0 12/30/2015 0841   O2SAT 94.0 01/01/2016 1843    Dg Chest  Port 1 View  Result Date: 01/01/2016 CLINICAL DATA:  Intubation EXAM: PORTABLE CHEST 1 VIEW COMPARISON:  01/01/2016 at 1441 hours FINDINGS: Endotracheal tube terminates 3 cm above the carina. Otherwise unchanged. Pulmonary fibrosis in the left lung with volume loss. Status post right lung transplant. Small right pleural effusion. Mild right lower lobe opacity, better visualized on CT. IMPRESSION: Endotracheal tube terminates 3 cm above the carina. Otherwise unchanged. Electronically Signed   By: Julian Hy M.D.   On: 01/01/2016 17:41  Dg Chest Port 1 View  Result Date: 01/01/2016 CLINICAL DATA:  Dyspnea, chest pain, pulmonary fibrosis, status post lung transplant EXAM: PORTABLE CHEST 1 VIEW COMPARISON:  CT chest dated 12/30/2015 FINDINGS: Status post right lung transplant. Chronic interstitial markings/ fibrosis with volume loss in the left lung. Small right pleural effusion. Mild right lower lobe opacity, atelectasis versus pneumonia. Mild interstitial edema is possible. The heart is normal in size.  Leftward cardiomediastinal shift. IMPRESSION: Status post right lung transplant. Pulmonary fibrosis in the left lung. Mild interstitial edema is possible.  Small right pleural effusion. Mild right lower lobe opacity, atelectasis versus pneumonia. Electronically Signed   By: Julian Hy M.D.   On: 01/01/2016 15:35  Dg Abd Portable 1v  Result Date: 01/01/2016 CLINICAL DATA:  OG tube placement EXAM: PORTABLE ABDOMEN - 1 VIEW COMPARISON:  CT 09/02/2015 FINDINGS: OG tube tip is in the proximal to mid stomach. Nonobstructive bowel gas pattern. No organomegaly or visible free air. IMPRESSION: OG tube tip in the proximal to mid stomach. Electronically Signed   By: Rolm Baptise M.D.   On: 01/01/2016 19:04

## 2016-01-01 NOTE — Progress Notes (Signed)
Hemodialysis- Treatment completed. Total UF +394 cc d/t bp drop and pt remaining symptomatic for greater than 15 minutes. PA aware. Pt currently has no complaints other than "Im cold." Given warm blankets. BP improved, now 152/78, HR 88, T 97.6 orally. Report called to primary RN on 5W.

## 2016-01-02 ENCOUNTER — Telehealth (HOSPITAL_BASED_OUTPATIENT_CLINIC_OR_DEPARTMENT_OTHER): Payer: Self-pay

## 2016-01-02 LAB — RESPIRATORY PANEL BY PCR
Adenovirus: NOT DETECTED
BORDETELLA PERTUSSIS-RVPCR: NOT DETECTED
CORONAVIRUS HKU1-RVPPCR: NOT DETECTED
CORONAVIRUS NL63-RVPPCR: NOT DETECTED
Chlamydophila pneumoniae: NOT DETECTED
Coronavirus 229E: NOT DETECTED
Coronavirus OC43: NOT DETECTED
INFLUENZA A H1 2009-RVPPR: NOT DETECTED
INFLUENZA A H1-RVPPCR: NOT DETECTED
INFLUENZA A H3-RVPPCR: NOT DETECTED
INFLUENZA A-RVPPCR: NOT DETECTED
INFLUENZA B-RVPPCR: NOT DETECTED
METAPNEUMOVIRUS-RVPPCR: NOT DETECTED
MYCOPLASMA PNEUMONIAE-RVPPCR: NOT DETECTED
PARAINFLUENZA VIRUS 2-RVPPCR: NOT DETECTED
PARAINFLUENZA VIRUS 3-RVPPCR: NOT DETECTED
Parainfluenza Virus 1: NOT DETECTED
Parainfluenza Virus 4: NOT DETECTED
Respiratory Syncytial Virus: NOT DETECTED
Rhinovirus / Enterovirus: NOT DETECTED

## 2016-01-02 NOTE — Telephone Encounter (Signed)
Pt with + blood culture transferred to [facility] on board spectrum abx.

## 2016-01-03 LAB — CYCLOSPORINE: CYCLOSPORINE, LABCORP: 402 ng/mL — AB (ref 100–400)

## 2016-01-04 DIAGNOSIS — T86819 Unspecified complication of lung transplant: Secondary | ICD-10-CM | POA: Diagnosis not present

## 2016-01-04 DIAGNOSIS — Z992 Dependence on renal dialysis: Secondary | ICD-10-CM | POA: Diagnosis not present

## 2016-01-04 DIAGNOSIS — N186 End stage renal disease: Secondary | ICD-10-CM | POA: Diagnosis not present

## 2016-01-04 LAB — CULTURE, RESPIRATORY W GRAM STAIN: Culture: NORMAL

## 2016-01-04 LAB — CULTURE, RESPIRATORY: CULTURE: NORMAL

## 2016-01-04 LAB — ACID FAST SMEAR (AFB, MYCOBACTERIA)
Acid Fast Smear: NEGATIVE
Acid Fast Smear: NEGATIVE

## 2016-01-04 LAB — PNEUMOCYSTIS JIROVECI SMEAR BY DFA: Pneumocystis jiroveci Ag: NEGATIVE

## 2016-01-04 LAB — HISTOPLASMA ANTIBODIES: Histoplasma Ab Yeast CF: <1:8 {titer}

## 2016-01-04 LAB — ACID FAST SMEAR (AFB)

## 2016-01-04 LAB — CULTURE, BLOOD (ROUTINE X 2)
CULTURE: NO GROWTH
Culture: NO GROWTH

## 2016-01-05 LAB — CMV DNA, QUANTITATIVE, PCR
CMV DNA Quant: 9450 IU/mL
LOG10 CMV QN DNA PL: 3.975 {Log_IU}/mL

## 2016-01-05 LAB — ASPERGILLUS ANTIGEN, BAL/SERUM: Aspergillus Ag, BAL/Serum: 0.18 Index (ref 0.00–0.49)

## 2016-01-06 ENCOUNTER — Telehealth: Payer: Self-pay | Admitting: Pulmonary Disease

## 2016-01-06 NOTE — Telephone Encounter (Signed)
   I saw this pt as a consult last Friday.  He is S/P lung transplant and went into resp fx.  He was transferred to [facility] ICU. Results of bronchoscopy CMV DNA PCR came back elevated (results in EPIC).  Can we fax results to [facility] ICU ?  Or maybe send/fax results to Dr. Lacey Jensen, his regular lung transplant pulmonologist.  Bottom line, [facility] needs to know results of cmv are (+).   Thanks.   Monica Becton, MD 01/06/2016, 1:18 AM [facility] Pager [phone] After 3 pm or if no answer, call [phone]

## 2016-01-06 NOTE — Telephone Encounter (Signed)
Please advise where these docs are in the chart? Thanks

## 2016-01-07 NOTE — Telephone Encounter (Signed)
   I took care of telling [facility] re: CMV results being positive. I spoke with the nurse taking care of the pt.  Monica Becton, MD 01/07/2016, 9:08 AM [facility] Pager [phone] After 3 pm or if no answer, call [phone]

## 2016-01-08 LAB — FUNGUS CULTURE, BLOOD: Culture: NO GROWTH

## 2016-01-12 LAB — CYTOMEGALOVIRUS (CMV) CULTURE - CMVCUL

## 2016-01-14 ENCOUNTER — Ambulatory Visit: Payer: 59 | Admitting: Neurology

## 2016-01-16 DIAGNOSIS — B258 Other cytomegaloviral diseases: Secondary | ICD-10-CM | POA: Diagnosis not present

## 2016-01-16 DIAGNOSIS — M1990 Unspecified osteoarthritis, unspecified site: Secondary | ICD-10-CM | POA: Diagnosis not present

## 2016-01-16 DIAGNOSIS — J849 Interstitial pulmonary disease, unspecified: Secondary | ICD-10-CM | POA: Diagnosis not present

## 2016-01-16 DIAGNOSIS — N186 End stage renal disease: Secondary | ICD-10-CM | POA: Diagnosis not present

## 2016-01-16 DIAGNOSIS — I129 Hypertensive chronic kidney disease with stage 1 through stage 4 chronic kidney disease, or unspecified chronic kidney disease: Secondary | ICD-10-CM | POA: Diagnosis not present

## 2016-01-16 DIAGNOSIS — D631 Anemia in chronic kidney disease: Secondary | ICD-10-CM | POA: Diagnosis not present

## 2016-01-16 DIAGNOSIS — M059 Rheumatoid arthritis with rheumatoid factor, unspecified: Secondary | ICD-10-CM | POA: Diagnosis not present

## 2016-01-16 DIAGNOSIS — E1122 Type 2 diabetes mellitus with diabetic chronic kidney disease: Secondary | ICD-10-CM | POA: Diagnosis not present

## 2016-01-17 DIAGNOSIS — J849 Interstitial pulmonary disease, unspecified: Secondary | ICD-10-CM | POA: Diagnosis not present

## 2016-01-17 DIAGNOSIS — B258 Other cytomegaloviral diseases: Secondary | ICD-10-CM | POA: Diagnosis not present

## 2016-01-18 DIAGNOSIS — J9601 Acute respiratory failure with hypoxia: Secondary | ICD-10-CM | POA: Diagnosis not present

## 2016-01-18 DIAGNOSIS — B258 Other cytomegaloviral diseases: Secondary | ICD-10-CM | POA: Diagnosis not present

## 2016-01-18 DIAGNOSIS — E1129 Type 2 diabetes mellitus with other diabetic kidney complication: Secondary | ICD-10-CM | POA: Diagnosis not present

## 2016-01-18 DIAGNOSIS — D631 Anemia in chronic kidney disease: Secondary | ICD-10-CM | POA: Diagnosis not present

## 2016-01-18 DIAGNOSIS — N186 End stage renal disease: Secondary | ICD-10-CM | POA: Diagnosis not present

## 2016-01-18 DIAGNOSIS — J849 Interstitial pulmonary disease, unspecified: Secondary | ICD-10-CM | POA: Diagnosis not present

## 2016-01-18 DIAGNOSIS — N2581 Secondary hyperparathyroidism of renal origin: Secondary | ICD-10-CM | POA: Diagnosis not present

## 2016-01-18 DIAGNOSIS — T8579XA Infection and inflammatory reaction due to other internal prosthetic devices, implants and grafts, initial encounter: Secondary | ICD-10-CM | POA: Diagnosis not present

## 2016-01-19 DIAGNOSIS — J849 Interstitial pulmonary disease, unspecified: Secondary | ICD-10-CM | POA: Diagnosis not present

## 2016-01-19 DIAGNOSIS — B258 Other cytomegaloviral diseases: Secondary | ICD-10-CM | POA: Diagnosis not present

## 2016-01-19 DIAGNOSIS — T86810 Lung transplant rejection: Secondary | ICD-10-CM | POA: Diagnosis not present

## 2016-01-20 DIAGNOSIS — E1129 Type 2 diabetes mellitus with other diabetic kidney complication: Secondary | ICD-10-CM | POA: Diagnosis not present

## 2016-01-20 DIAGNOSIS — T8579XA Infection and inflammatory reaction due to other internal prosthetic devices, implants and grafts, initial encounter: Secondary | ICD-10-CM | POA: Diagnosis not present

## 2016-01-20 DIAGNOSIS — D631 Anemia in chronic kidney disease: Secondary | ICD-10-CM | POA: Diagnosis not present

## 2016-01-20 DIAGNOSIS — N2581 Secondary hyperparathyroidism of renal origin: Secondary | ICD-10-CM | POA: Diagnosis not present

## 2016-01-20 DIAGNOSIS — N186 End stage renal disease: Secondary | ICD-10-CM | POA: Diagnosis not present

## 2016-01-21 DIAGNOSIS — B258 Other cytomegaloviral diseases: Secondary | ICD-10-CM | POA: Diagnosis not present

## 2016-01-21 DIAGNOSIS — J849 Interstitial pulmonary disease, unspecified: Secondary | ICD-10-CM | POA: Diagnosis not present

## 2016-01-22 DIAGNOSIS — D631 Anemia in chronic kidney disease: Secondary | ICD-10-CM | POA: Diagnosis not present

## 2016-01-22 DIAGNOSIS — N2581 Secondary hyperparathyroidism of renal origin: Secondary | ICD-10-CM | POA: Diagnosis not present

## 2016-01-22 DIAGNOSIS — N186 End stage renal disease: Secondary | ICD-10-CM | POA: Diagnosis not present

## 2016-01-22 DIAGNOSIS — T8579XA Infection and inflammatory reaction due to other internal prosthetic devices, implants and grafts, initial encounter: Secondary | ICD-10-CM | POA: Diagnosis not present

## 2016-01-22 DIAGNOSIS — E1129 Type 2 diabetes mellitus with other diabetic kidney complication: Secondary | ICD-10-CM | POA: Diagnosis not present

## 2016-01-25 DIAGNOSIS — D631 Anemia in chronic kidney disease: Secondary | ICD-10-CM | POA: Diagnosis not present

## 2016-01-25 DIAGNOSIS — T8579XA Infection and inflammatory reaction due to other internal prosthetic devices, implants and grafts, initial encounter: Secondary | ICD-10-CM | POA: Diagnosis not present

## 2016-01-25 DIAGNOSIS — N186 End stage renal disease: Secondary | ICD-10-CM | POA: Diagnosis not present

## 2016-01-25 DIAGNOSIS — E1129 Type 2 diabetes mellitus with other diabetic kidney complication: Secondary | ICD-10-CM | POA: Diagnosis not present

## 2016-01-25 DIAGNOSIS — J849 Interstitial pulmonary disease, unspecified: Secondary | ICD-10-CM | POA: Diagnosis not present

## 2016-01-25 DIAGNOSIS — B258 Other cytomegaloviral diseases: Secondary | ICD-10-CM | POA: Diagnosis not present

## 2016-01-25 DIAGNOSIS — N2581 Secondary hyperparathyroidism of renal origin: Secondary | ICD-10-CM | POA: Diagnosis not present

## 2016-01-26 DIAGNOSIS — J849 Interstitial pulmonary disease, unspecified: Secondary | ICD-10-CM | POA: Diagnosis not present

## 2016-01-26 DIAGNOSIS — T86810 Lung transplant rejection: Secondary | ICD-10-CM | POA: Diagnosis not present

## 2016-01-26 DIAGNOSIS — Z79899 Other long term (current) drug therapy: Secondary | ICD-10-CM | POA: Diagnosis not present

## 2016-01-26 DIAGNOSIS — B258 Other cytomegaloviral diseases: Secondary | ICD-10-CM | POA: Diagnosis not present

## 2016-01-27 DIAGNOSIS — T8579XA Infection and inflammatory reaction due to other internal prosthetic devices, implants and grafts, initial encounter: Secondary | ICD-10-CM | POA: Diagnosis not present

## 2016-01-27 DIAGNOSIS — N186 End stage renal disease: Secondary | ICD-10-CM | POA: Diagnosis not present

## 2016-01-27 DIAGNOSIS — E1129 Type 2 diabetes mellitus with other diabetic kidney complication: Secondary | ICD-10-CM | POA: Diagnosis not present

## 2016-01-27 DIAGNOSIS — D631 Anemia in chronic kidney disease: Secondary | ICD-10-CM | POA: Diagnosis not present

## 2016-01-27 DIAGNOSIS — N2581 Secondary hyperparathyroidism of renal origin: Secondary | ICD-10-CM | POA: Diagnosis not present

## 2016-01-28 DIAGNOSIS — J849 Interstitial pulmonary disease, unspecified: Secondary | ICD-10-CM | POA: Diagnosis not present

## 2016-01-28 DIAGNOSIS — B258 Other cytomegaloviral diseases: Secondary | ICD-10-CM | POA: Diagnosis not present

## 2016-01-29 DIAGNOSIS — D631 Anemia in chronic kidney disease: Secondary | ICD-10-CM | POA: Diagnosis not present

## 2016-01-29 DIAGNOSIS — N2581 Secondary hyperparathyroidism of renal origin: Secondary | ICD-10-CM | POA: Diagnosis not present

## 2016-01-29 DIAGNOSIS — N186 End stage renal disease: Secondary | ICD-10-CM | POA: Diagnosis not present

## 2016-01-29 DIAGNOSIS — T8579XA Infection and inflammatory reaction due to other internal prosthetic devices, implants and grafts, initial encounter: Secondary | ICD-10-CM | POA: Diagnosis not present

## 2016-01-29 DIAGNOSIS — E1129 Type 2 diabetes mellitus with other diabetic kidney complication: Secondary | ICD-10-CM | POA: Diagnosis not present

## 2016-01-31 DIAGNOSIS — A419 Sepsis, unspecified organism: Secondary | ICD-10-CM

## 2016-02-01 DIAGNOSIS — Z992 Dependence on renal dialysis: Secondary | ICD-10-CM | POA: Diagnosis not present

## 2016-02-01 DIAGNOSIS — B258 Other cytomegaloviral diseases: Secondary | ICD-10-CM | POA: Diagnosis not present

## 2016-02-01 DIAGNOSIS — T8579XA Infection and inflammatory reaction due to other internal prosthetic devices, implants and grafts, initial encounter: Secondary | ICD-10-CM | POA: Diagnosis not present

## 2016-02-01 DIAGNOSIS — E1129 Type 2 diabetes mellitus with other diabetic kidney complication: Secondary | ICD-10-CM | POA: Diagnosis not present

## 2016-02-01 DIAGNOSIS — I871 Compression of vein: Secondary | ICD-10-CM | POA: Diagnosis not present

## 2016-02-01 DIAGNOSIS — I771 Stricture of artery: Secondary | ICD-10-CM | POA: Diagnosis not present

## 2016-02-01 DIAGNOSIS — N186 End stage renal disease: Secondary | ICD-10-CM | POA: Diagnosis not present

## 2016-02-01 DIAGNOSIS — J849 Interstitial pulmonary disease, unspecified: Secondary | ICD-10-CM | POA: Diagnosis not present

## 2016-02-01 DIAGNOSIS — T82858A Stenosis of vascular prosthetic devices, implants and grafts, initial encounter: Secondary | ICD-10-CM | POA: Diagnosis not present

## 2016-02-01 DIAGNOSIS — D631 Anemia in chronic kidney disease: Secondary | ICD-10-CM | POA: Diagnosis not present

## 2016-02-01 DIAGNOSIS — N2581 Secondary hyperparathyroidism of renal origin: Secondary | ICD-10-CM | POA: Diagnosis not present

## 2016-02-02 DIAGNOSIS — Z4824 Encounter for aftercare following lung transplant: Secondary | ICD-10-CM | POA: Diagnosis not present

## 2016-02-02 LAB — FUNGAL ORGANISM REFLEX

## 2016-02-02 LAB — FUNGUS CULTURE WITH STAIN

## 2016-02-02 LAB — FUNGUS CULTURE RESULT

## 2016-02-03 DIAGNOSIS — N186 End stage renal disease: Secondary | ICD-10-CM | POA: Diagnosis not present

## 2016-02-03 DIAGNOSIS — B258 Other cytomegaloviral diseases: Secondary | ICD-10-CM | POA: Diagnosis not present

## 2016-02-03 DIAGNOSIS — D631 Anemia in chronic kidney disease: Secondary | ICD-10-CM | POA: Diagnosis not present

## 2016-02-03 DIAGNOSIS — E1129 Type 2 diabetes mellitus with other diabetic kidney complication: Secondary | ICD-10-CM | POA: Diagnosis not present

## 2016-02-03 DIAGNOSIS — N2581 Secondary hyperparathyroidism of renal origin: Secondary | ICD-10-CM | POA: Diagnosis not present

## 2016-02-03 DIAGNOSIS — J849 Interstitial pulmonary disease, unspecified: Secondary | ICD-10-CM | POA: Diagnosis not present

## 2016-02-03 DIAGNOSIS — T8579XA Infection and inflammatory reaction due to other internal prosthetic devices, implants and grafts, initial encounter: Secondary | ICD-10-CM | POA: Diagnosis not present

## 2016-02-04 DIAGNOSIS — T86819 Unspecified complication of lung transplant: Secondary | ICD-10-CM | POA: Diagnosis not present

## 2016-02-04 DIAGNOSIS — B258 Other cytomegaloviral diseases: Secondary | ICD-10-CM | POA: Diagnosis not present

## 2016-02-04 DIAGNOSIS — Z992 Dependence on renal dialysis: Secondary | ICD-10-CM | POA: Diagnosis not present

## 2016-02-04 DIAGNOSIS — J849 Interstitial pulmonary disease, unspecified: Secondary | ICD-10-CM | POA: Diagnosis not present

## 2016-02-04 DIAGNOSIS — N186 End stage renal disease: Secondary | ICD-10-CM | POA: Diagnosis not present

## 2016-02-05 DIAGNOSIS — D631 Anemia in chronic kidney disease: Secondary | ICD-10-CM | POA: Diagnosis not present

## 2016-02-05 DIAGNOSIS — N2581 Secondary hyperparathyroidism of renal origin: Secondary | ICD-10-CM | POA: Diagnosis not present

## 2016-02-05 DIAGNOSIS — E1129 Type 2 diabetes mellitus with other diabetic kidney complication: Secondary | ICD-10-CM | POA: Diagnosis not present

## 2016-02-05 DIAGNOSIS — T8579XA Infection and inflammatory reaction due to other internal prosthetic devices, implants and grafts, initial encounter: Secondary | ICD-10-CM | POA: Diagnosis not present

## 2016-02-05 DIAGNOSIS — N186 End stage renal disease: Secondary | ICD-10-CM | POA: Diagnosis not present

## 2016-02-08 DIAGNOSIS — I129 Hypertensive chronic kidney disease with stage 1 through stage 4 chronic kidney disease, or unspecified chronic kidney disease: Secondary | ICD-10-CM | POA: Diagnosis not present

## 2016-02-08 DIAGNOSIS — J849 Interstitial pulmonary disease, unspecified: Secondary | ICD-10-CM | POA: Diagnosis not present

## 2016-02-08 DIAGNOSIS — B258 Other cytomegaloviral diseases: Secondary | ICD-10-CM | POA: Diagnosis not present

## 2016-02-09 DIAGNOSIS — Z79899 Other long term (current) drug therapy: Secondary | ICD-10-CM | POA: Diagnosis not present

## 2016-02-09 DIAGNOSIS — Z7982 Long term (current) use of aspirin: Secondary | ICD-10-CM | POA: Diagnosis not present

## 2016-02-09 DIAGNOSIS — T86810 Lung transplant rejection: Secondary | ICD-10-CM | POA: Diagnosis not present

## 2016-02-09 DIAGNOSIS — D899 Disorder involving the immune mechanism, unspecified: Secondary | ICD-10-CM | POA: Diagnosis not present

## 2016-02-09 LAB — FUNGUS CULTURE RESULT

## 2016-02-09 LAB — FUNGUS CULTURE WITH STAIN

## 2016-02-09 LAB — FUNGAL ORGANISM REFLEX

## 2016-02-11 ENCOUNTER — Other Ambulatory Visit: Payer: Self-pay

## 2016-02-11 DIAGNOSIS — B258 Other cytomegaloviral diseases: Secondary | ICD-10-CM | POA: Diagnosis not present

## 2016-02-11 DIAGNOSIS — J849 Interstitial pulmonary disease, unspecified: Secondary | ICD-10-CM | POA: Diagnosis not present

## 2016-02-15 DIAGNOSIS — E1122 Type 2 diabetes mellitus with diabetic chronic kidney disease: Secondary | ICD-10-CM | POA: Diagnosis not present

## 2016-02-15 DIAGNOSIS — N186 End stage renal disease: Secondary | ICD-10-CM | POA: Diagnosis not present

## 2016-02-15 DIAGNOSIS — B258 Other cytomegaloviral diseases: Secondary | ICD-10-CM | POA: Diagnosis not present

## 2016-02-15 DIAGNOSIS — D631 Anemia in chronic kidney disease: Secondary | ICD-10-CM | POA: Diagnosis not present

## 2016-02-15 DIAGNOSIS — I129 Hypertensive chronic kidney disease with stage 1 through stage 4 chronic kidney disease, or unspecified chronic kidney disease: Secondary | ICD-10-CM | POA: Diagnosis not present

## 2016-02-15 DIAGNOSIS — J849 Interstitial pulmonary disease, unspecified: Secondary | ICD-10-CM | POA: Diagnosis not present

## 2016-02-16 DIAGNOSIS — Z4824 Encounter for aftercare following lung transplant: Secondary | ICD-10-CM | POA: Diagnosis not present

## 2016-02-16 LAB — ACID FAST CULTURE WITH REFLEXED SENSITIVITIES: ACID FAST CULTURE - AFSCU3: NEGATIVE

## 2016-02-16 LAB — ACID FAST CULTURE WITH REFLEXED SENSITIVITIES (MYCOBACTERIA): Acid Fast Culture: NEGATIVE

## 2016-02-18 DIAGNOSIS — B258 Other cytomegaloviral diseases: Secondary | ICD-10-CM | POA: Diagnosis not present

## 2016-02-18 DIAGNOSIS — J849 Interstitial pulmonary disease, unspecified: Secondary | ICD-10-CM | POA: Diagnosis not present

## 2016-02-22 DIAGNOSIS — B258 Other cytomegaloviral diseases: Secondary | ICD-10-CM | POA: Diagnosis not present

## 2016-02-22 DIAGNOSIS — D631 Anemia in chronic kidney disease: Secondary | ICD-10-CM | POA: Diagnosis not present

## 2016-02-22 DIAGNOSIS — N186 End stage renal disease: Secondary | ICD-10-CM | POA: Diagnosis not present

## 2016-02-22 DIAGNOSIS — E1122 Type 2 diabetes mellitus with diabetic chronic kidney disease: Secondary | ICD-10-CM | POA: Diagnosis not present

## 2016-02-22 DIAGNOSIS — I129 Hypertensive chronic kidney disease with stage 1 through stage 4 chronic kidney disease, or unspecified chronic kidney disease: Secondary | ICD-10-CM | POA: Diagnosis not present

## 2016-02-22 DIAGNOSIS — J849 Interstitial pulmonary disease, unspecified: Secondary | ICD-10-CM | POA: Diagnosis not present

## 2016-02-23 DIAGNOSIS — Z942 Lung transplant status: Secondary | ICD-10-CM | POA: Diagnosis not present

## 2016-02-23 DIAGNOSIS — R319 Hematuria, unspecified: Secondary | ICD-10-CM | POA: Diagnosis not present

## 2016-02-23 DIAGNOSIS — Z79899 Other long term (current) drug therapy: Secondary | ICD-10-CM | POA: Diagnosis not present

## 2016-02-23 DIAGNOSIS — D899 Disorder involving the immune mechanism, unspecified: Secondary | ICD-10-CM | POA: Diagnosis not present

## 2016-02-23 DIAGNOSIS — R251 Tremor, unspecified: Secondary | ICD-10-CM | POA: Diagnosis not present

## 2016-02-23 DIAGNOSIS — B349 Viral infection, unspecified: Secondary | ICD-10-CM | POA: Diagnosis not present

## 2016-02-23 DIAGNOSIS — Z4824 Encounter for aftercare following lung transplant: Secondary | ICD-10-CM | POA: Diagnosis not present

## 2016-02-23 DIAGNOSIS — Z7189 Other specified counseling: Secondary | ICD-10-CM | POA: Diagnosis not present

## 2016-02-23 DIAGNOSIS — Z86718 Personal history of other venous thrombosis and embolism: Secondary | ICD-10-CM | POA: Diagnosis not present

## 2016-02-23 DIAGNOSIS — R1013 Epigastric pain: Secondary | ICD-10-CM | POA: Diagnosis not present

## 2016-02-23 DIAGNOSIS — N186 End stage renal disease: Secondary | ICD-10-CM | POA: Diagnosis not present

## 2016-02-23 DIAGNOSIS — Z23 Encounter for immunization: Secondary | ICD-10-CM | POA: Diagnosis not present

## 2016-02-23 DIAGNOSIS — Z8709 Personal history of other diseases of the respiratory system: Secondary | ICD-10-CM | POA: Diagnosis not present

## 2016-02-23 DIAGNOSIS — T380X5D Adverse effect of glucocorticoids and synthetic analogues, subsequent encounter: Secondary | ICD-10-CM | POA: Diagnosis not present

## 2016-02-23 DIAGNOSIS — Z9889 Other specified postprocedural states: Secondary | ICD-10-CM | POA: Diagnosis not present

## 2016-02-23 DIAGNOSIS — Z7982 Long term (current) use of aspirin: Secondary | ICD-10-CM | POA: Diagnosis not present

## 2016-02-23 DIAGNOSIS — Z992 Dependence on renal dialysis: Secondary | ICD-10-CM | POA: Diagnosis not present

## 2016-02-23 DIAGNOSIS — R739 Hyperglycemia, unspecified: Secondary | ICD-10-CM | POA: Diagnosis not present

## 2016-02-23 DIAGNOSIS — K228 Other specified diseases of esophagus: Secondary | ICD-10-CM | POA: Diagnosis not present

## 2016-02-23 DIAGNOSIS — G4700 Insomnia, unspecified: Secondary | ICD-10-CM | POA: Diagnosis not present

## 2016-02-23 DIAGNOSIS — R6881 Early satiety: Secondary | ICD-10-CM | POA: Diagnosis not present

## 2016-02-23 DIAGNOSIS — I120 Hypertensive chronic kidney disease with stage 5 chronic kidney disease or end stage renal disease: Secondary | ICD-10-CM | POA: Diagnosis not present

## 2016-02-23 DIAGNOSIS — Z5309 Procedure and treatment not carried out because of other contraindication: Secondary | ICD-10-CM | POA: Diagnosis not present

## 2016-02-23 DIAGNOSIS — Z794 Long term (current) use of insulin: Secondary | ICD-10-CM | POA: Diagnosis not present

## 2016-02-23 DIAGNOSIS — Z7952 Long term (current) use of systemic steroids: Secondary | ICD-10-CM | POA: Diagnosis not present

## 2016-02-23 DIAGNOSIS — I151 Hypertension secondary to other renal disorders: Secondary | ICD-10-CM | POA: Diagnosis not present

## 2016-02-23 DIAGNOSIS — Z792 Long term (current) use of antibiotics: Secondary | ICD-10-CM | POA: Diagnosis not present

## 2016-02-29 DIAGNOSIS — I129 Hypertensive chronic kidney disease with stage 1 through stage 4 chronic kidney disease, or unspecified chronic kidney disease: Secondary | ICD-10-CM | POA: Diagnosis not present

## 2016-02-29 DIAGNOSIS — D631 Anemia in chronic kidney disease: Secondary | ICD-10-CM | POA: Diagnosis not present

## 2016-02-29 DIAGNOSIS — E1122 Type 2 diabetes mellitus with diabetic chronic kidney disease: Secondary | ICD-10-CM | POA: Diagnosis not present

## 2016-02-29 DIAGNOSIS — B258 Other cytomegaloviral diseases: Secondary | ICD-10-CM | POA: Diagnosis not present

## 2016-02-29 DIAGNOSIS — J849 Interstitial pulmonary disease, unspecified: Secondary | ICD-10-CM | POA: Diagnosis not present

## 2016-02-29 DIAGNOSIS — N186 End stage renal disease: Secondary | ICD-10-CM | POA: Diagnosis not present

## 2016-03-01 DIAGNOSIS — I120 Hypertensive chronic kidney disease with stage 5 chronic kidney disease or end stage renal disease: Secondary | ICD-10-CM | POA: Diagnosis not present

## 2016-03-01 DIAGNOSIS — N186 End stage renal disease: Secondary | ICD-10-CM | POA: Diagnosis not present

## 2016-03-01 DIAGNOSIS — Z992 Dependence on renal dialysis: Secondary | ICD-10-CM | POA: Diagnosis not present

## 2016-03-01 DIAGNOSIS — T86819 Unspecified complication of lung transplant: Secondary | ICD-10-CM | POA: Diagnosis not present

## 2016-03-01 DIAGNOSIS — Z794 Long term (current) use of insulin: Secondary | ICD-10-CM | POA: Diagnosis not present

## 2016-03-01 DIAGNOSIS — T86810 Lung transplant rejection: Secondary | ICD-10-CM | POA: Diagnosis not present

## 2016-03-01 DIAGNOSIS — Z942 Lung transplant status: Secondary | ICD-10-CM | POA: Diagnosis not present

## 2016-03-01 DIAGNOSIS — E1122 Type 2 diabetes mellitus with diabetic chronic kidney disease: Secondary | ICD-10-CM | POA: Diagnosis not present

## 2016-03-01 DIAGNOSIS — R942 Abnormal results of pulmonary function studies: Secondary | ICD-10-CM | POA: Diagnosis not present

## 2016-03-05 DIAGNOSIS — N186 End stage renal disease: Secondary | ICD-10-CM | POA: Diagnosis not present

## 2016-03-05 DIAGNOSIS — T86819 Unspecified complication of lung transplant: Secondary | ICD-10-CM | POA: Diagnosis not present

## 2016-03-05 DIAGNOSIS — Z992 Dependence on renal dialysis: Secondary | ICD-10-CM | POA: Diagnosis not present

## 2016-03-07 DIAGNOSIS — J849 Interstitial pulmonary disease, unspecified: Secondary | ICD-10-CM | POA: Diagnosis not present

## 2016-03-07 DIAGNOSIS — B258 Other cytomegaloviral diseases: Secondary | ICD-10-CM | POA: Diagnosis not present

## 2016-03-07 DIAGNOSIS — N2581 Secondary hyperparathyroidism of renal origin: Secondary | ICD-10-CM | POA: Diagnosis not present

## 2016-03-07 DIAGNOSIS — I129 Hypertensive chronic kidney disease with stage 1 through stage 4 chronic kidney disease, or unspecified chronic kidney disease: Secondary | ICD-10-CM | POA: Diagnosis not present

## 2016-03-07 DIAGNOSIS — N186 End stage renal disease: Secondary | ICD-10-CM | POA: Diagnosis not present

## 2016-03-07 DIAGNOSIS — D631 Anemia in chronic kidney disease: Secondary | ICD-10-CM | POA: Diagnosis not present

## 2016-03-07 DIAGNOSIS — T8579XA Infection and inflammatory reaction due to other internal prosthetic devices, implants and grafts, initial encounter: Secondary | ICD-10-CM | POA: Diagnosis not present

## 2016-03-07 DIAGNOSIS — E1122 Type 2 diabetes mellitus with diabetic chronic kidney disease: Secondary | ICD-10-CM | POA: Diagnosis not present

## 2016-03-07 DIAGNOSIS — R6883 Chills (without fever): Secondary | ICD-10-CM | POA: Diagnosis not present

## 2016-03-08 DIAGNOSIS — Z794 Long term (current) use of insulin: Secondary | ICD-10-CM | POA: Diagnosis not present

## 2016-03-08 DIAGNOSIS — E0822 Diabetes mellitus due to underlying condition with diabetic chronic kidney disease: Secondary | ICD-10-CM | POA: Diagnosis not present

## 2016-03-09 DIAGNOSIS — N186 End stage renal disease: Secondary | ICD-10-CM | POA: Diagnosis not present

## 2016-03-09 DIAGNOSIS — R6883 Chills (without fever): Secondary | ICD-10-CM | POA: Diagnosis not present

## 2016-03-09 DIAGNOSIS — N2581 Secondary hyperparathyroidism of renal origin: Secondary | ICD-10-CM | POA: Diagnosis not present

## 2016-03-09 DIAGNOSIS — T8579XA Infection and inflammatory reaction due to other internal prosthetic devices, implants and grafts, initial encounter: Secondary | ICD-10-CM | POA: Diagnosis not present

## 2016-03-11 DIAGNOSIS — T8579XA Infection and inflammatory reaction due to other internal prosthetic devices, implants and grafts, initial encounter: Secondary | ICD-10-CM | POA: Diagnosis not present

## 2016-03-11 DIAGNOSIS — R6883 Chills (without fever): Secondary | ICD-10-CM | POA: Diagnosis not present

## 2016-03-11 DIAGNOSIS — N186 End stage renal disease: Secondary | ICD-10-CM | POA: Diagnosis not present

## 2016-03-11 DIAGNOSIS — N2581 Secondary hyperparathyroidism of renal origin: Secondary | ICD-10-CM | POA: Diagnosis not present

## 2016-03-15 DIAGNOSIS — T8579XA Infection and inflammatory reaction due to other internal prosthetic devices, implants and grafts, initial encounter: Secondary | ICD-10-CM | POA: Diagnosis not present

## 2016-03-15 DIAGNOSIS — N186 End stage renal disease: Secondary | ICD-10-CM | POA: Diagnosis not present

## 2016-03-15 DIAGNOSIS — R6883 Chills (without fever): Secondary | ICD-10-CM | POA: Diagnosis not present

## 2016-03-15 DIAGNOSIS — N2581 Secondary hyperparathyroidism of renal origin: Secondary | ICD-10-CM | POA: Diagnosis not present

## 2016-03-15 DIAGNOSIS — B258 Other cytomegaloviral diseases: Secondary | ICD-10-CM | POA: Diagnosis not present

## 2016-03-15 DIAGNOSIS — J849 Interstitial pulmonary disease, unspecified: Secondary | ICD-10-CM | POA: Diagnosis not present

## 2016-03-16 DIAGNOSIS — B258 Other cytomegaloviral diseases: Secondary | ICD-10-CM | POA: Diagnosis not present

## 2016-03-16 DIAGNOSIS — T8579XA Infection and inflammatory reaction due to other internal prosthetic devices, implants and grafts, initial encounter: Secondary | ICD-10-CM | POA: Diagnosis not present

## 2016-03-16 DIAGNOSIS — J849 Interstitial pulmonary disease, unspecified: Secondary | ICD-10-CM | POA: Diagnosis not present

## 2016-03-16 DIAGNOSIS — D631 Anemia in chronic kidney disease: Secondary | ICD-10-CM | POA: Diagnosis not present

## 2016-03-16 DIAGNOSIS — N2581 Secondary hyperparathyroidism of renal origin: Secondary | ICD-10-CM | POA: Diagnosis not present

## 2016-03-16 DIAGNOSIS — N186 End stage renal disease: Secondary | ICD-10-CM | POA: Diagnosis not present

## 2016-03-16 DIAGNOSIS — E1122 Type 2 diabetes mellitus with diabetic chronic kidney disease: Secondary | ICD-10-CM | POA: Diagnosis not present

## 2016-03-16 DIAGNOSIS — I129 Hypertensive chronic kidney disease with stage 1 through stage 4 chronic kidney disease, or unspecified chronic kidney disease: Secondary | ICD-10-CM | POA: Diagnosis not present

## 2016-03-16 DIAGNOSIS — R6883 Chills (without fever): Secondary | ICD-10-CM | POA: Diagnosis not present

## 2016-03-16 DIAGNOSIS — M059 Rheumatoid arthritis with rheumatoid factor, unspecified: Secondary | ICD-10-CM | POA: Diagnosis not present

## 2016-03-16 DIAGNOSIS — M1991 Primary osteoarthritis, unspecified site: Secondary | ICD-10-CM | POA: Diagnosis not present

## 2016-03-17 DIAGNOSIS — B259 Cytomegaloviral disease, unspecified: Secondary | ICD-10-CM | POA: Diagnosis not present

## 2016-03-17 DIAGNOSIS — T86810 Lung transplant rejection: Secondary | ICD-10-CM | POA: Diagnosis not present

## 2016-03-17 DIAGNOSIS — Z79899 Other long term (current) drug therapy: Secondary | ICD-10-CM | POA: Diagnosis not present

## 2016-03-18 DIAGNOSIS — T8579XA Infection and inflammatory reaction due to other internal prosthetic devices, implants and grafts, initial encounter: Secondary | ICD-10-CM | POA: Diagnosis not present

## 2016-03-18 DIAGNOSIS — N186 End stage renal disease: Secondary | ICD-10-CM | POA: Diagnosis not present

## 2016-03-18 DIAGNOSIS — R6883 Chills (without fever): Secondary | ICD-10-CM | POA: Diagnosis not present

## 2016-03-18 DIAGNOSIS — N2581 Secondary hyperparathyroidism of renal origin: Secondary | ICD-10-CM | POA: Diagnosis not present

## 2016-03-21 ENCOUNTER — Emergency Department (HOSPITAL_COMMUNITY)
Admission: EM | Admit: 2016-03-21 | Discharge: 2016-03-21 | Disposition: A | Discharge status: Home / Self Care | Payer: Medicare Other | Attending: Emergency Medicine | Admitting: Emergency Medicine

## 2016-03-21 ENCOUNTER — Encounter (HOSPITAL_COMMUNITY): Payer: Self-pay

## 2016-03-21 ENCOUNTER — Emergency Department (HOSPITAL_COMMUNITY): Payer: Medicare Other

## 2016-03-21 DIAGNOSIS — Z87891 Personal history of nicotine dependence: Secondary | ICD-10-CM | POA: Insufficient documentation

## 2016-03-21 DIAGNOSIS — R55 Syncope and collapse: Secondary | ICD-10-CM | POA: Diagnosis not present

## 2016-03-21 DIAGNOSIS — N186 End stage renal disease: Secondary | ICD-10-CM | POA: Diagnosis not present

## 2016-03-21 DIAGNOSIS — E1122 Type 2 diabetes mellitus with diabetic chronic kidney disease: Secondary | ICD-10-CM | POA: Diagnosis not present

## 2016-03-21 DIAGNOSIS — I120 Hypertensive chronic kidney disease with stage 5 chronic kidney disease or end stage renal disease: Secondary | ICD-10-CM | POA: Insufficient documentation

## 2016-03-21 DIAGNOSIS — R6883 Chills (without fever): Secondary | ICD-10-CM | POA: Diagnosis not present

## 2016-03-21 DIAGNOSIS — Z992 Dependence on renal dialysis: Secondary | ICD-10-CM | POA: Insufficient documentation

## 2016-03-21 DIAGNOSIS — T8579XA Infection and inflammatory reaction due to other internal prosthetic devices, implants and grafts, initial encounter: Secondary | ICD-10-CM | POA: Diagnosis not present

## 2016-03-21 DIAGNOSIS — Z86018 Personal history of other benign neoplasm: Secondary | ICD-10-CM | POA: Diagnosis not present

## 2016-03-21 DIAGNOSIS — N2581 Secondary hyperparathyroidism of renal origin: Secondary | ICD-10-CM | POA: Diagnosis not present

## 2016-03-21 DIAGNOSIS — B258 Other cytomegaloviral diseases: Secondary | ICD-10-CM | POA: Diagnosis not present

## 2016-03-21 DIAGNOSIS — Z942 Lung transplant status: Secondary | ICD-10-CM | POA: Diagnosis not present

## 2016-03-21 DIAGNOSIS — J849 Interstitial pulmonary disease, unspecified: Secondary | ICD-10-CM | POA: Diagnosis not present

## 2016-03-21 DIAGNOSIS — I129 Hypertensive chronic kidney disease with stage 1 through stage 4 chronic kidney disease, or unspecified chronic kidney disease: Secondary | ICD-10-CM | POA: Diagnosis not present

## 2016-03-21 DIAGNOSIS — R031 Nonspecific low blood-pressure reading: Secondary | ICD-10-CM | POA: Diagnosis not present

## 2016-03-21 DIAGNOSIS — R197 Diarrhea, unspecified: Secondary | ICD-10-CM | POA: Diagnosis not present

## 2016-03-21 DIAGNOSIS — R079 Chest pain, unspecified: Secondary | ICD-10-CM | POA: Diagnosis not present

## 2016-03-21 LAB — LIPASE, BLOOD: LIPASE: 22 U/L (ref 11–51)

## 2016-03-21 LAB — COMPREHENSIVE METABOLIC PANEL
ALK PHOS: 188 U/L — AB (ref 38–126)
ALT: 63 U/L (ref 17–63)
AST: 76 U/L — AB (ref 15–41)
Albumin: 2.8 g/dL — ABNORMAL LOW (ref 3.5–5.0)
Anion gap: 14 (ref 5–15)
BILIRUBIN TOTAL: 1.1 mg/dL (ref 0.3–1.2)
BUN: 21 mg/dL — AB (ref 6–20)
CALCIUM: 8.6 mg/dL — AB (ref 8.9–10.3)
CO2: 26 mmol/L (ref 22–32)
Chloride: 94 mmol/L — ABNORMAL LOW (ref 101–111)
Creatinine, Ser: 3.96 mg/dL — ABNORMAL HIGH (ref 0.61–1.24)
GFR calc Af Amer: 17 mL/min — ABNORMAL LOW (ref >60)
GFR, EST NON AFRICAN AMERICAN: 14 mL/min — AB (ref >60)
GLUCOSE: 87 mg/dL (ref 65–99)
POTASSIUM: 3.4 mmol/L — AB (ref 3.5–5.1)
Sodium: 134 mmol/L — ABNORMAL LOW (ref 135–145)
TOTAL PROTEIN: 7.5 g/dL (ref 6.5–8.1)

## 2016-03-21 LAB — CBC WITH DIFFERENTIAL/PLATELET
Basophils Absolute: 0 10*3/uL (ref 0.0–0.1)
Basophils Relative: 0 %
EOS PCT: 0 %
Eosinophils Absolute: 0 10*3/uL (ref 0.0–0.7)
HEMATOCRIT: 37.1 % — AB (ref 39.0–52.0)
HEMOGLOBIN: 13.2 g/dL (ref 13.0–17.0)
LYMPHS PCT: 6 %
Lymphs Abs: 0.6 10*3/uL — ABNORMAL LOW (ref 0.7–4.0)
MCH: 35.8 pg — ABNORMAL HIGH (ref 26.0–34.0)
MCHC: 35.6 g/dL (ref 30.0–36.0)
MCV: 100.5 fL — AB (ref 78.0–100.0)
MONOS PCT: 12 %
Monocytes Absolute: 1.2 10*3/uL — ABNORMAL HIGH (ref 0.1–1.0)
Neutro Abs: 8.3 10*3/uL — ABNORMAL HIGH (ref 1.7–7.7)
Neutrophils Relative %: 82 %
Platelets: 184 10*3/uL (ref 150–400)
RBC: 3.69 MIL/uL — AB (ref 4.22–5.81)
RDW: 16.3 % — ABNORMAL HIGH (ref 11.5–15.5)
WBC: 10.1 10*3/uL (ref 4.0–10.5)

## 2016-03-21 LAB — I-STAT CG4 LACTIC ACID, ED: Lactic Acid, Venous: 1.49 mmol/L (ref 0.5–1.9)

## 2016-03-21 LAB — I-STAT TROPONIN, ED: TROPONIN I, POC: 0.01 ng/mL (ref 0.00–0.08)

## 2016-03-21 LAB — PROTIME-INR
INR: 1
Prothrombin Time: 13.2 seconds (ref 11.4–15.2)

## 2016-03-21 MED ORDER — GELATIN ABSORBABLE 12-7 MM EX MISC
1.0000 | Freq: Once | CUTANEOUS | Status: DC
Start: 2016-03-21 — End: 2016-03-22
  Filled 2016-03-21: qty 1

## 2016-03-21 NOTE — ED Notes (Signed)
Pt requesting update on plan of care. Dr. Laverta Baltimore at bedside

## 2016-03-21 NOTE — Discharge Instructions (Signed)

## 2016-03-21 NOTE — ED Provider Notes (Signed)
Emergency Department Provider Note   I have reviewed the triage vital signs and the nursing notes.   HISTORY  Chief Complaint Near Syncope   HPI [name] is a 65 y.o. male with PMH of aortic valve disorder, DM, ESRD on HD, pulmonary fibrosis s/p lung transplant presents to the emergency department for evaluation of near syncope and hypotension toward the end of dialysis today. Patient states that the dialysis had gone well and toward the end he felt acutely lightheaded and slightly warm. He does not believe he lost consciousness completely but did feel acutely bad. He states he's had some mild symptoms similar to this toward the end of other dialysis periods. He notes a PICC line in the right arm since July.    Past Medical History:  Diagnosis Date  . Aortic valve disorders   . Benign neoplasm of colon   . Degeneration of intervertebral disc, site unspecified   . Diabetes mellitus without complication ([place])   . 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 Esophageal reflux   . ESRD (end stage renal disease) on dialysis ([place])   . Essential hypertension   . Obstructive sleep apnea (adult) (pediatric)   . Osteoarthrosis, unspecified whether generalized or localized, unspecified site   . Other and unspecified hyperlipidemia   . Pneumonia    interstitial pneumonia  . Pulmonary fibrosis ([place])   . Renal disorder   . Respiratory failure with hypoxia ([place]) 12/2015  . Shortness of breath dyspnea   . Transplanted, lung ([place])   . Unspecified essential hypertension     Patient Active Problem List   Diagnosis Date Noted  . Sepsis ([place])   . CKD (chronic kidney disease)   . Generalized weakness 12/30/2015  . ESRD (end stage renal disease) on dialysis ([place])   . 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[place])   . Acute respiratory failure with hypoxemia ([place]) 04/18/2015  . Septic shock ([place]) 04/18/2015  . Acute encephalopathy 04/18/2015  . Acute respiratory failure ([place])  04/18/2015  . Cardiac arrest ([place])   . HCAP (healthcare-associated pneumonia)   . Elevated rheumatoid factor 05/09/2014  . Essential hypertension 05/09/2014  . ILD (interstitial lung disease) ([place]) 05/09/2014  . Obstructive apnea 05/09/2014  . Lung nodule, solitary 04/18/2014  . Awaiting organ transplant 04/18/2014  . Acute on chronic respiratory failure with hypoxia ([place]) 08/15/2013  . Edema 07/05/2013  . Chronic respiratory failure ([place]) 03/03/2013  . Diabetes mellitus with complication ([place]) 93/71/6967  . Acute sinusitis 05/04/2011  . Cough 11/10/2010  . Pulmonary fibrosis, postinflammatory () 10/26/2009  . Obstructive sleep apnea 10/09/2008  . ACID REFLUX DISEASE 10/09/2008  . HIATAL HERNIA 10/09/2008  . OSTEOARTHRITIS 10/09/2008    Past Surgical History:  Procedure Laterality Date  . LUNG BIOPSY  2010  . LUNG TRANSPLANT, SINGLE Right     Current Outpatient Rx  . Order #: [id number] Class: Historical Med  . Order #: [id number] Class: Normal  . Order #: [id number] Class: Historical Med  . Order #: [id number] Class: Historical Med  . Order #: [id number] Class: Normal  . Order #: [id number] Class: No Print  . Order #: [id number] Class: Historical Med  . Order #: [id number] Class: Historical Med  . Order #: [id number] Class: Historical Med  . Order #: [id number] Class: Print  . Order #: [id number] Class: Historical Med  . Order #: [id number] Class: No Print  . Order #: [id number] Class: Historical Med  . Order #: [id number] Class: Historical Med  . Order #: [id number] Class: Historical Med  . Order #: [id number] Class: Historical Med  .  Order #: [id number] Class: Historical Med  . Order #: [id number] Class: Historical Med  . Order #: [id number] Class: Historical Med  . Order #: [id number] Class: Historical Med  . Order #: [id number] Class: Historical Med  . Order #: [id number] Class: Normal  . Order #: [id number] Class: Historical Med  . Order #: [id number] Class: Normal  . Order #: [id number] Class: Historical  Med    Allergies Levofloxacin and Nsaids  Family History  Problem Relation Age of Onset  . Pancreatic cancer Brother   . Heart disease Father     Social History Social History  Substance Use Topics  . Smoking status: Former Smoker    Packs/day: 0.30    Years: 20.00    Types: Cigarettes    Quit date: 06/06/2002  . Smokeless tobacco: Never Used  . Alcohol use No    Review of Systems  Constitutional: No fever/chills Eyes: No visual changes. ENT: No sore throat. Cardiovascular: Denies chest pain. Positive near-syncope.  Respiratory: Denies shortness of breath. Gastrointestinal: No abdominal pain.  No nausea, no vomiting.  No diarrhea.  No constipation. Genitourinary: Negative for dysuria. Musculoskeletal: Negative for back pain. Skin: Negative for rash. Neurological: Negative for headaches, focal weakness or numbness.  10-point ROS otherwise negative.  ____________________________________________   PHYSICAL EXAM:  VITAL SIGNS: ED Triage Vitals [03/21/16 1810]  Enc Vitals Group     BP 132/74     Pulse Rate 78     Resp 19     Temp 98.4 F (36.9 C)     Temp src      SpO2 99 %   Constitutional: Alert and oriented. Well appearing and in no acute distress. Eyes: Conjunctivae are normal.  Head: Atraumatic. Nose: No congestion/rhinnorhea. Mouth/Throat: Mucous membranes are moist.  Oropharynx non-erythematous. Neck: No stridor.  Cardiovascular: Normal rate, regular rhythm. Good peripheral circulation. Grossly normal heart sounds. PICC line in left arm. Accessed fistula on the left upper arm.  Respiratory: Normal respiratory effort.  No retractions. Lungs CTAB. Gastrointestinal: Soft with mild diffuse tenderness. No rebound or guarding. No distention.  Musculoskeletal: No lower extremity tenderness nor edema. No gross deformities of extremities. Neurologic:  Normal speech and language. No gross focal neurologic deficits are appreciated.  Skin:  Skin is warm, dry and  intact. No rash noted. Psychiatric: Mood and affect are normal. Speech and behavior are normal  ____________________________________________   LABS (all labs ordered are listed, but only abnormal results are displayed)  Labs Reviewed  COMPREHENSIVE METABOLIC PANEL - Abnormal; Notable for the following:       Result Value   Sodium 134 (*)    Potassium 3.4 (*)    Chloride 94 (*)    BUN 21 (*)    Creatinine, Ser 3.96 (*)    Calcium 8.6 (*)    Albumin 2.8 (*)    AST 76 (*)    Alkaline Phosphatase 188 (*)    GFR calc non Af Amer 14 (*)    GFR calc Af Amer 17 (*)    All other components within normal limits  CBC WITH DIFFERENTIAL/PLATELET - Abnormal; Notable for the following:    RBC 3.69 (*)    HCT 37.1 (*)    MCV 100.5 (*)    MCH 35.8 (*)    RDW 16.3 (*)    Neutro Abs 8.3 (*)    Lymphs Abs 0.6 (*)    Monocytes Absolute 1.2 (*)    All other components within normal limits  CULTURE, BLOOD (ROUTINE X 2)  CULTURE, BLOOD (ROUTINE X  2)  LIPASE, BLOOD  PROTIME-INR  I-STAT TROPOININ, ED  I-STAT CG4 LACTIC ACID, ED   ____________________________________________  EKG   EKG Interpretation  Date/Time:  Monday March 21 2016 19:23:40 EDT Ventricular Rate:  75 PR Interval:    QRS Duration: 86 QT Interval:  418 QTC Calculation: 467 R Axis:   44 Text Interpretation:  Sinus rhythm Left atrial enlargement RSR' in V1 or V2, right VCD or RVH Abnrm T, consider ischemia, anterolateral lds No STEMI. Unchanged from prior.  Confirmed by LONG MD, JOSHUA [phone]) on 03/21/2016 7:27:09 PM Also confirmed by LONG MD, JOSHUA [phone]), editor [place] CT, Leda Gauze [phone])  on 03/22/2016 9:18:08 AM       ____________________________________________  RADIOLOGY  Dg Chest Portable 1 View  Result Date: 03/21/2016 CLINICAL DATA:  Syncope and chest pain.  Post dialysis. EXAM: PORTABLE CHEST 1 VIEW COMPARISON:  01/01/2016 FINDINGS: Right arm PICC tip in the lower SVC. Hyperinflation of the right lung  with scarring in the bases. Negative for edema or infiltrate on the right. There is blunting of the right costophrenic angle which appears chronic Prominent reticular markings in the left lung were present on prior studies consistent with scarring. No effusion on the left. Surgical clips in the right hilum.  No mass lesion. IMPRESSION: Chronic lung disease. Increased reticular markings in the left lung similar to prior studies. No acute infiltrate. Electronically Signed   By: Franchot Gallo M.D.   On: 03/21/2016 19:17    ____________________________________________   PROCEDURES  Procedure(s) performed:   Procedures  None ____________________________________________   INITIAL IMPRESSION / ASSESSMENT AND PLAN / ED COURSE  Pertinent labs & imaging results that were available during my care of the patient were reviewed by me and considered in my medical decision making (see chart for details).  Patient with past medical history of pulmonary fibrosis status post lung transplant and end-stage renal disease presents to the emergency department for evaluation of syncope after dialysis. The patient was 8 minutes until the end of his session when he suddenly felt lightheaded and had reported low blood pressures in the 80s. No complete syncope. More mild episodes like this in the recent past. No fevers or obvious source for underlying infectious process. No associated chest pain. The patient does have an indwelling line and a recent urinary tract infection. Plan for blood cultures and broad-spectrum lab work including lactate and troponin. Will also obtain chest x-ray. Patient blood pressure is normalized in the emergency department.  Labs, imaging, EKG, and vital signs reviewed. Patient unable to provide urine sample in the ED. No UTI symptoms currently. Suspect that hypotension is 2/2 HD complication rather than underlying infection or cardiogenic etiology. Discussed my impression with the patient and  family. I offered observational admission for further monitoring but the patient and family at bedside would prefer to return home and call their PCP and [facility] team in the AM. Given results today and ED clinical course I feel that this time also reasonable. Discussed return precautions in detail.  ____________________________________________  FINAL CLINICAL IMPRESSION(S) / ED DIAGNOSES  Final diagnoses:  Near syncope     MEDICATIONS GIVEN DURING THIS VISIT:  None  NEW OUTPATIENT MEDICATIONS STARTED DURING THIS VISIT:  None   Note:  This document was prepared using Dragon voice recognition software and may include unintentional dictation errors.  Nanda Quinton, MD Emergency Medicine   Margette Fast, MD 03/22/16 1011

## 2016-03-21 NOTE — Progress Notes (Signed)
Asked to deaccess LT upper arm fistula.   Both HD needles intact.  I removed both needles and held pressure for 24 minutes due to bleeding.  He let me know they have a hard time getting him stopped bleeding after HDl   This was normal for him.   Pressure drsgs applied after hemostasis achieved.

## 2016-03-21 NOTE — ED Notes (Signed)
Pt attempted to provide urine sample without success.

## 2016-03-21 NOTE — ED Notes (Signed)
Pt fistula still accessed upon arrival to ED, paged IV team and placed order for deaccessing of fistula.

## 2016-03-21 NOTE — ED Triage Notes (Signed)
Per EMS - pt from dialysis, received all except 8 minutes of treatment. Pt became hypotensive (SBP 80), vomited 1x, denies LOC. Did report chills and diaphoresis. BP 133/86 with EMS. A&O x 4, VSS, NAD. Pt had right lung transplant last year and has been taking fungal meds for the last 3 months, wears [place] O2 as needed.

## 2016-03-23 DIAGNOSIS — N186 End stage renal disease: Secondary | ICD-10-CM | POA: Diagnosis not present

## 2016-03-23 DIAGNOSIS — N2581 Secondary hyperparathyroidism of renal origin: Secondary | ICD-10-CM | POA: Diagnosis not present

## 2016-03-23 DIAGNOSIS — R6883 Chills (without fever): Secondary | ICD-10-CM | POA: Diagnosis not present

## 2016-03-23 DIAGNOSIS — T8579XA Infection and inflammatory reaction due to other internal prosthetic devices, implants and grafts, initial encounter: Secondary | ICD-10-CM | POA: Diagnosis not present

## 2016-03-24 DIAGNOSIS — Z942 Lung transplant status: Secondary | ICD-10-CM | POA: Diagnosis not present

## 2016-03-24 DIAGNOSIS — B259 Cytomegaloviral disease, unspecified: Secondary | ICD-10-CM | POA: Diagnosis not present

## 2016-03-25 DIAGNOSIS — T8579XA Infection and inflammatory reaction due to other internal prosthetic devices, implants and grafts, initial encounter: Secondary | ICD-10-CM | POA: Diagnosis not present

## 2016-03-25 DIAGNOSIS — N186 End stage renal disease: Secondary | ICD-10-CM | POA: Diagnosis not present

## 2016-03-25 DIAGNOSIS — N2581 Secondary hyperparathyroidism of renal origin: Secondary | ICD-10-CM | POA: Diagnosis not present

## 2016-03-25 DIAGNOSIS — R6883 Chills (without fever): Secondary | ICD-10-CM | POA: Diagnosis not present

## 2016-03-26 LAB — CULTURE, BLOOD (ROUTINE X 2)
CULTURE: NO GROWTH
Culture: NO GROWTH

## 2016-03-28 DIAGNOSIS — J849 Interstitial pulmonary disease, unspecified: Secondary | ICD-10-CM | POA: Diagnosis not present

## 2016-03-28 DIAGNOSIS — B259 Cytomegaloviral disease, unspecified: Secondary | ICD-10-CM | POA: Diagnosis not present

## 2016-03-28 DIAGNOSIS — B258 Other cytomegaloviral diseases: Secondary | ICD-10-CM | POA: Diagnosis not present

## 2016-03-28 DIAGNOSIS — R6883 Chills (without fever): Secondary | ICD-10-CM | POA: Diagnosis not present

## 2016-03-28 DIAGNOSIS — T8579XA Infection and inflammatory reaction due to other internal prosthetic devices, implants and grafts, initial encounter: Secondary | ICD-10-CM | POA: Diagnosis not present

## 2016-03-28 DIAGNOSIS — I129 Hypertensive chronic kidney disease with stage 1 through stage 4 chronic kidney disease, or unspecified chronic kidney disease: Secondary | ICD-10-CM | POA: Diagnosis not present

## 2016-03-28 DIAGNOSIS — N186 End stage renal disease: Secondary | ICD-10-CM | POA: Diagnosis not present

## 2016-03-28 DIAGNOSIS — N2581 Secondary hyperparathyroidism of renal origin: Secondary | ICD-10-CM | POA: Diagnosis not present

## 2016-03-30 DIAGNOSIS — R6883 Chills (without fever): Secondary | ICD-10-CM | POA: Diagnosis not present

## 2016-03-30 DIAGNOSIS — T8579XA Infection and inflammatory reaction due to other internal prosthetic devices, implants and grafts, initial encounter: Secondary | ICD-10-CM | POA: Diagnosis not present

## 2016-03-30 DIAGNOSIS — N2581 Secondary hyperparathyroidism of renal origin: Secondary | ICD-10-CM | POA: Diagnosis not present

## 2016-03-30 DIAGNOSIS — N186 End stage renal disease: Secondary | ICD-10-CM | POA: Diagnosis not present

## 2016-04-01 DIAGNOSIS — R6883 Chills (without fever): Secondary | ICD-10-CM | POA: Diagnosis not present

## 2016-04-01 DIAGNOSIS — N186 End stage renal disease: Secondary | ICD-10-CM | POA: Diagnosis not present

## 2016-04-01 DIAGNOSIS — N2581 Secondary hyperparathyroidism of renal origin: Secondary | ICD-10-CM | POA: Diagnosis not present

## 2016-04-01 DIAGNOSIS — T8579XA Infection and inflammatory reaction due to other internal prosthetic devices, implants and grafts, initial encounter: Secondary | ICD-10-CM | POA: Diagnosis not present

## 2016-04-04 DIAGNOSIS — R6883 Chills (without fever): Secondary | ICD-10-CM | POA: Diagnosis not present

## 2016-04-04 DIAGNOSIS — T8579XA Infection and inflammatory reaction due to other internal prosthetic devices, implants and grafts, initial encounter: Secondary | ICD-10-CM | POA: Diagnosis not present

## 2016-04-04 DIAGNOSIS — I129 Hypertensive chronic kidney disease with stage 1 through stage 4 chronic kidney disease, or unspecified chronic kidney disease: Secondary | ICD-10-CM | POA: Diagnosis not present

## 2016-04-04 DIAGNOSIS — N2581 Secondary hyperparathyroidism of renal origin: Secondary | ICD-10-CM | POA: Diagnosis not present

## 2016-04-04 DIAGNOSIS — J849 Interstitial pulmonary disease, unspecified: Secondary | ICD-10-CM | POA: Diagnosis not present

## 2016-04-04 DIAGNOSIS — R258 Other abnormal involuntary movements: Secondary | ICD-10-CM | POA: Diagnosis not present

## 2016-04-04 DIAGNOSIS — B258 Other cytomegaloviral diseases: Secondary | ICD-10-CM | POA: Diagnosis not present

## 2016-04-04 DIAGNOSIS — N186 End stage renal disease: Secondary | ICD-10-CM | POA: Diagnosis not present

## 2016-04-05 DIAGNOSIS — E0922 Drug or chemical induced diabetes mellitus with diabetic chronic kidney disease: Secondary | ICD-10-CM | POA: Diagnosis not present

## 2016-04-05 DIAGNOSIS — T86819 Unspecified complication of lung transplant: Secondary | ICD-10-CM | POA: Diagnosis not present

## 2016-04-05 DIAGNOSIS — N185 Chronic kidney disease, stage 5: Secondary | ICD-10-CM | POA: Diagnosis not present

## 2016-04-05 DIAGNOSIS — Z992 Dependence on renal dialysis: Secondary | ICD-10-CM | POA: Diagnosis not present

## 2016-04-05 DIAGNOSIS — I151 Hypertension secondary to other renal disorders: Secondary | ICD-10-CM | POA: Diagnosis not present

## 2016-04-05 DIAGNOSIS — E0801 Diabetes mellitus due to underlying condition with hyperosmolarity with coma: Secondary | ICD-10-CM | POA: Diagnosis not present

## 2016-04-05 DIAGNOSIS — Z79899 Other long term (current) drug therapy: Secondary | ICD-10-CM | POA: Diagnosis not present

## 2016-04-05 DIAGNOSIS — T86810 Lung transplant rejection: Secondary | ICD-10-CM | POA: Diagnosis not present

## 2016-04-05 DIAGNOSIS — Z942 Lung transplant status: Secondary | ICD-10-CM | POA: Diagnosis not present

## 2016-04-05 DIAGNOSIS — B259 Cytomegaloviral disease, unspecified: Secondary | ICD-10-CM | POA: Diagnosis not present

## 2016-04-05 DIAGNOSIS — I120 Hypertensive chronic kidney disease with stage 5 chronic kidney disease or end stage renal disease: Secondary | ICD-10-CM | POA: Diagnosis not present

## 2016-04-05 DIAGNOSIS — D899 Disorder involving the immune mechanism, unspecified: Secondary | ICD-10-CM | POA: Diagnosis not present

## 2016-04-05 DIAGNOSIS — J8410 Pulmonary fibrosis, unspecified: Secondary | ICD-10-CM | POA: Diagnosis not present

## 2016-04-05 DIAGNOSIS — K279 Peptic ulcer, site unspecified, unspecified as acute or chronic, without hemorrhage or perforation: Secondary | ICD-10-CM | POA: Diagnosis not present

## 2016-04-05 DIAGNOSIS — N2889 Other specified disorders of kidney and ureter: Secondary | ICD-10-CM | POA: Diagnosis not present

## 2016-04-05 DIAGNOSIS — G4700 Insomnia, unspecified: Secondary | ICD-10-CM | POA: Diagnosis not present

## 2016-04-05 DIAGNOSIS — N186 End stage renal disease: Secondary | ICD-10-CM | POA: Diagnosis not present

## 2016-04-05 DIAGNOSIS — Z794 Long term (current) use of insulin: Secondary | ICD-10-CM | POA: Diagnosis not present

## 2016-04-05 DIAGNOSIS — T380X5A Adverse effect of glucocorticoids and synthetic analogues, initial encounter: Secondary | ICD-10-CM | POA: Diagnosis not present

## 2016-04-06 DIAGNOSIS — D631 Anemia in chronic kidney disease: Secondary | ICD-10-CM | POA: Diagnosis not present

## 2016-04-06 DIAGNOSIS — N186 End stage renal disease: Secondary | ICD-10-CM | POA: Diagnosis not present

## 2016-04-06 DIAGNOSIS — Z23 Encounter for immunization: Secondary | ICD-10-CM | POA: Diagnosis not present

## 2016-04-06 DIAGNOSIS — T8579XA Infection and inflammatory reaction due to other internal prosthetic devices, implants and grafts, initial encounter: Secondary | ICD-10-CM | POA: Diagnosis not present

## 2016-04-06 DIAGNOSIS — E1129 Type 2 diabetes mellitus with other diabetic kidney complication: Secondary | ICD-10-CM | POA: Diagnosis not present

## 2016-04-06 DIAGNOSIS — N2581 Secondary hyperparathyroidism of renal origin: Secondary | ICD-10-CM | POA: Diagnosis not present

## 2016-04-08 DIAGNOSIS — N2581 Secondary hyperparathyroidism of renal origin: Secondary | ICD-10-CM | POA: Diagnosis not present

## 2016-04-08 DIAGNOSIS — Z23 Encounter for immunization: Secondary | ICD-10-CM | POA: Diagnosis not present

## 2016-04-08 DIAGNOSIS — N186 End stage renal disease: Secondary | ICD-10-CM | POA: Diagnosis not present

## 2016-04-08 DIAGNOSIS — D631 Anemia in chronic kidney disease: Secondary | ICD-10-CM | POA: Diagnosis not present

## 2016-04-08 DIAGNOSIS — T8579XA Infection and inflammatory reaction due to other internal prosthetic devices, implants and grafts, initial encounter: Secondary | ICD-10-CM | POA: Diagnosis not present

## 2016-04-08 DIAGNOSIS — E1129 Type 2 diabetes mellitus with other diabetic kidney complication: Secondary | ICD-10-CM | POA: Diagnosis not present

## 2016-04-11 DIAGNOSIS — I129 Hypertensive chronic kidney disease with stage 1 through stage 4 chronic kidney disease, or unspecified chronic kidney disease: Secondary | ICD-10-CM | POA: Diagnosis not present

## 2016-04-11 DIAGNOSIS — N186 End stage renal disease: Secondary | ICD-10-CM | POA: Diagnosis not present

## 2016-04-11 DIAGNOSIS — N2581 Secondary hyperparathyroidism of renal origin: Secondary | ICD-10-CM | POA: Diagnosis not present

## 2016-04-11 DIAGNOSIS — B259 Cytomegaloviral disease, unspecified: Secondary | ICD-10-CM | POA: Diagnosis not present

## 2016-04-11 DIAGNOSIS — J849 Interstitial pulmonary disease, unspecified: Secondary | ICD-10-CM | POA: Diagnosis not present

## 2016-04-11 DIAGNOSIS — B258 Other cytomegaloviral diseases: Secondary | ICD-10-CM | POA: Diagnosis not present

## 2016-04-11 DIAGNOSIS — E1129 Type 2 diabetes mellitus with other diabetic kidney complication: Secondary | ICD-10-CM | POA: Diagnosis not present

## 2016-04-11 DIAGNOSIS — T8579XA Infection and inflammatory reaction due to other internal prosthetic devices, implants and grafts, initial encounter: Secondary | ICD-10-CM | POA: Diagnosis not present

## 2016-04-11 DIAGNOSIS — D631 Anemia in chronic kidney disease: Secondary | ICD-10-CM | POA: Diagnosis not present

## 2016-04-11 DIAGNOSIS — Z23 Encounter for immunization: Secondary | ICD-10-CM | POA: Diagnosis not present

## 2016-04-12 DIAGNOSIS — H5010 Unspecified exotropia: Secondary | ICD-10-CM | POA: Diagnosis not present

## 2016-04-12 DIAGNOSIS — H35033 Hypertensive retinopathy, bilateral: Secondary | ICD-10-CM | POA: Diagnosis not present

## 2016-04-12 DIAGNOSIS — H4063X1 Glaucoma secondary to drugs, bilateral, mild stage: Secondary | ICD-10-CM | POA: Diagnosis not present

## 2016-04-13 DIAGNOSIS — N2581 Secondary hyperparathyroidism of renal origin: Secondary | ICD-10-CM | POA: Diagnosis not present

## 2016-04-13 DIAGNOSIS — D631 Anemia in chronic kidney disease: Secondary | ICD-10-CM | POA: Diagnosis not present

## 2016-04-13 DIAGNOSIS — T8579XA Infection and inflammatory reaction due to other internal prosthetic devices, implants and grafts, initial encounter: Secondary | ICD-10-CM | POA: Diagnosis not present

## 2016-04-13 DIAGNOSIS — E1129 Type 2 diabetes mellitus with other diabetic kidney complication: Secondary | ICD-10-CM | POA: Diagnosis not present

## 2016-04-13 DIAGNOSIS — Z23 Encounter for immunization: Secondary | ICD-10-CM | POA: Diagnosis not present

## 2016-04-13 DIAGNOSIS — N186 End stage renal disease: Secondary | ICD-10-CM | POA: Diagnosis not present

## 2016-04-15 DIAGNOSIS — D631 Anemia in chronic kidney disease: Secondary | ICD-10-CM | POA: Diagnosis not present

## 2016-04-15 DIAGNOSIS — N186 End stage renal disease: Secondary | ICD-10-CM | POA: Diagnosis not present

## 2016-04-15 DIAGNOSIS — T8579XA Infection and inflammatory reaction due to other internal prosthetic devices, implants and grafts, initial encounter: Secondary | ICD-10-CM | POA: Diagnosis not present

## 2016-04-15 DIAGNOSIS — E1129 Type 2 diabetes mellitus with other diabetic kidney complication: Secondary | ICD-10-CM | POA: Diagnosis not present

## 2016-04-15 DIAGNOSIS — N2581 Secondary hyperparathyroidism of renal origin: Secondary | ICD-10-CM | POA: Diagnosis not present

## 2016-04-15 DIAGNOSIS — Z23 Encounter for immunization: Secondary | ICD-10-CM | POA: Diagnosis not present

## 2016-04-18 DIAGNOSIS — E1129 Type 2 diabetes mellitus with other diabetic kidney complication: Secondary | ICD-10-CM | POA: Diagnosis not present

## 2016-04-18 DIAGNOSIS — N186 End stage renal disease: Secondary | ICD-10-CM | POA: Diagnosis not present

## 2016-04-18 DIAGNOSIS — D631 Anemia in chronic kidney disease: Secondary | ICD-10-CM | POA: Diagnosis not present

## 2016-04-18 DIAGNOSIS — T8579XA Infection and inflammatory reaction due to other internal prosthetic devices, implants and grafts, initial encounter: Secondary | ICD-10-CM | POA: Diagnosis not present

## 2016-04-18 DIAGNOSIS — Z23 Encounter for immunization: Secondary | ICD-10-CM | POA: Diagnosis not present

## 2016-04-18 DIAGNOSIS — N2581 Secondary hyperparathyroidism of renal origin: Secondary | ICD-10-CM | POA: Diagnosis not present

## 2016-04-19 DIAGNOSIS — T86810 Lung transplant rejection: Secondary | ICD-10-CM | POA: Diagnosis not present

## 2016-04-20 DIAGNOSIS — N2581 Secondary hyperparathyroidism of renal origin: Secondary | ICD-10-CM | POA: Diagnosis not present

## 2016-04-20 DIAGNOSIS — T8579XA Infection and inflammatory reaction due to other internal prosthetic devices, implants and grafts, initial encounter: Secondary | ICD-10-CM | POA: Diagnosis not present

## 2016-04-20 DIAGNOSIS — E1129 Type 2 diabetes mellitus with other diabetic kidney complication: Secondary | ICD-10-CM | POA: Diagnosis not present

## 2016-04-20 DIAGNOSIS — Z23 Encounter for immunization: Secondary | ICD-10-CM | POA: Diagnosis not present

## 2016-04-20 DIAGNOSIS — N186 End stage renal disease: Secondary | ICD-10-CM | POA: Diagnosis not present

## 2016-04-20 DIAGNOSIS — D631 Anemia in chronic kidney disease: Secondary | ICD-10-CM | POA: Diagnosis not present

## 2016-04-22 DIAGNOSIS — N186 End stage renal disease: Secondary | ICD-10-CM | POA: Diagnosis not present

## 2016-04-22 DIAGNOSIS — Z23 Encounter for immunization: Secondary | ICD-10-CM | POA: Diagnosis not present

## 2016-04-22 DIAGNOSIS — T8579XA Infection and inflammatory reaction due to other internal prosthetic devices, implants and grafts, initial encounter: Secondary | ICD-10-CM | POA: Diagnosis not present

## 2016-04-22 DIAGNOSIS — E1129 Type 2 diabetes mellitus with other diabetic kidney complication: Secondary | ICD-10-CM | POA: Diagnosis not present

## 2016-04-22 DIAGNOSIS — D631 Anemia in chronic kidney disease: Secondary | ICD-10-CM | POA: Diagnosis not present

## 2016-04-22 DIAGNOSIS — N2581 Secondary hyperparathyroidism of renal origin: Secondary | ICD-10-CM | POA: Diagnosis not present

## 2016-04-24 DIAGNOSIS — E1129 Type 2 diabetes mellitus with other diabetic kidney complication: Secondary | ICD-10-CM | POA: Diagnosis not present

## 2016-04-24 DIAGNOSIS — D631 Anemia in chronic kidney disease: Secondary | ICD-10-CM | POA: Diagnosis not present

## 2016-04-24 DIAGNOSIS — N2581 Secondary hyperparathyroidism of renal origin: Secondary | ICD-10-CM | POA: Diagnosis not present

## 2016-04-24 DIAGNOSIS — N186 End stage renal disease: Secondary | ICD-10-CM | POA: Diagnosis not present

## 2016-04-24 DIAGNOSIS — Z23 Encounter for immunization: Secondary | ICD-10-CM | POA: Diagnosis not present

## 2016-04-24 DIAGNOSIS — T8579XA Infection and inflammatory reaction due to other internal prosthetic devices, implants and grafts, initial encounter: Secondary | ICD-10-CM | POA: Diagnosis not present

## 2016-04-26 DIAGNOSIS — I871 Compression of vein: Secondary | ICD-10-CM | POA: Diagnosis not present

## 2016-04-26 DIAGNOSIS — T82858A Stenosis of vascular prosthetic devices, implants and grafts, initial encounter: Secondary | ICD-10-CM | POA: Diagnosis not present

## 2016-04-26 DIAGNOSIS — E1129 Type 2 diabetes mellitus with other diabetic kidney complication: Secondary | ICD-10-CM | POA: Diagnosis not present

## 2016-04-26 DIAGNOSIS — Z23 Encounter for immunization: Secondary | ICD-10-CM | POA: Diagnosis not present

## 2016-04-26 DIAGNOSIS — N2581 Secondary hyperparathyroidism of renal origin: Secondary | ICD-10-CM | POA: Diagnosis not present

## 2016-04-26 DIAGNOSIS — N186 End stage renal disease: Secondary | ICD-10-CM | POA: Diagnosis not present

## 2016-04-26 DIAGNOSIS — Z992 Dependence on renal dialysis: Secondary | ICD-10-CM | POA: Diagnosis not present

## 2016-04-26 DIAGNOSIS — T8579XA Infection and inflammatory reaction due to other internal prosthetic devices, implants and grafts, initial encounter: Secondary | ICD-10-CM | POA: Diagnosis not present

## 2016-04-26 DIAGNOSIS — D631 Anemia in chronic kidney disease: Secondary | ICD-10-CM | POA: Diagnosis not present

## 2016-04-29 DIAGNOSIS — E1129 Type 2 diabetes mellitus with other diabetic kidney complication: Secondary | ICD-10-CM | POA: Diagnosis not present

## 2016-04-29 DIAGNOSIS — N2581 Secondary hyperparathyroidism of renal origin: Secondary | ICD-10-CM | POA: Diagnosis not present

## 2016-04-29 DIAGNOSIS — D631 Anemia in chronic kidney disease: Secondary | ICD-10-CM | POA: Diagnosis not present

## 2016-04-29 DIAGNOSIS — Z23 Encounter for immunization: Secondary | ICD-10-CM | POA: Diagnosis not present

## 2016-04-29 DIAGNOSIS — T8579XA Infection and inflammatory reaction due to other internal prosthetic devices, implants and grafts, initial encounter: Secondary | ICD-10-CM | POA: Diagnosis not present

## 2016-04-29 DIAGNOSIS — N186 End stage renal disease: Secondary | ICD-10-CM | POA: Diagnosis not present

## 2016-05-02 DIAGNOSIS — D631 Anemia in chronic kidney disease: Secondary | ICD-10-CM | POA: Diagnosis not present

## 2016-05-02 DIAGNOSIS — N186 End stage renal disease: Secondary | ICD-10-CM | POA: Diagnosis not present

## 2016-05-02 DIAGNOSIS — E1129 Type 2 diabetes mellitus with other diabetic kidney complication: Secondary | ICD-10-CM | POA: Diagnosis not present

## 2016-05-02 DIAGNOSIS — Z23 Encounter for immunization: Secondary | ICD-10-CM | POA: Diagnosis not present

## 2016-05-02 DIAGNOSIS — N2581 Secondary hyperparathyroidism of renal origin: Secondary | ICD-10-CM | POA: Diagnosis not present

## 2016-05-02 DIAGNOSIS — T8579XA Infection and inflammatory reaction due to other internal prosthetic devices, implants and grafts, initial encounter: Secondary | ICD-10-CM | POA: Diagnosis not present

## 2016-05-03 DIAGNOSIS — J849 Interstitial pulmonary disease, unspecified: Secondary | ICD-10-CM | POA: Diagnosis not present

## 2016-05-03 DIAGNOSIS — Z4824 Encounter for aftercare following lung transplant: Secondary | ICD-10-CM | POA: Diagnosis not present

## 2016-05-03 DIAGNOSIS — R251 Tremor, unspecified: Secondary | ICD-10-CM | POA: Diagnosis not present

## 2016-05-03 DIAGNOSIS — K219 Gastro-esophageal reflux disease without esophagitis: Secondary | ICD-10-CM | POA: Diagnosis not present

## 2016-05-03 DIAGNOSIS — J8410 Pulmonary fibrosis, unspecified: Secondary | ICD-10-CM | POA: Diagnosis not present

## 2016-05-03 DIAGNOSIS — J329 Chronic sinusitis, unspecified: Secondary | ICD-10-CM | POA: Diagnosis not present

## 2016-05-03 DIAGNOSIS — Z5181 Encounter for therapeutic drug level monitoring: Secondary | ICD-10-CM | POA: Diagnosis not present

## 2016-05-03 DIAGNOSIS — T86810 Lung transplant rejection: Secondary | ICD-10-CM | POA: Diagnosis not present

## 2016-05-03 DIAGNOSIS — N186 End stage renal disease: Secondary | ICD-10-CM | POA: Diagnosis not present

## 2016-05-03 DIAGNOSIS — G4700 Insomnia, unspecified: Secondary | ICD-10-CM | POA: Diagnosis not present

## 2016-05-03 DIAGNOSIS — J069 Acute upper respiratory infection, unspecified: Secondary | ICD-10-CM | POA: Diagnosis not present

## 2016-05-03 DIAGNOSIS — Z992 Dependence on renal dialysis: Secondary | ICD-10-CM | POA: Diagnosis not present

## 2016-05-03 DIAGNOSIS — Z7901 Long term (current) use of anticoagulants: Secondary | ICD-10-CM | POA: Diagnosis not present

## 2016-05-03 DIAGNOSIS — Z794 Long term (current) use of insulin: Secondary | ICD-10-CM | POA: Diagnosis not present

## 2016-05-03 DIAGNOSIS — Z942 Lung transplant status: Secondary | ICD-10-CM | POA: Diagnosis not present

## 2016-05-03 DIAGNOSIS — R739 Hyperglycemia, unspecified: Secondary | ICD-10-CM | POA: Diagnosis not present

## 2016-05-03 DIAGNOSIS — Z79899 Other long term (current) drug therapy: Secondary | ICD-10-CM | POA: Diagnosis not present

## 2016-05-03 DIAGNOSIS — R6881 Early satiety: Secondary | ICD-10-CM | POA: Diagnosis not present

## 2016-05-03 DIAGNOSIS — I151 Hypertension secondary to other renal disorders: Secondary | ICD-10-CM | POA: Diagnosis not present

## 2016-05-03 DIAGNOSIS — I120 Hypertensive chronic kidney disease with stage 5 chronic kidney disease or end stage renal disease: Secondary | ICD-10-CM | POA: Diagnosis not present

## 2016-05-03 DIAGNOSIS — N2889 Other specified disorders of kidney and ureter: Secondary | ICD-10-CM | POA: Diagnosis not present

## 2016-05-03 DIAGNOSIS — D899 Disorder involving the immune mechanism, unspecified: Secondary | ICD-10-CM | POA: Diagnosis not present

## 2016-05-03 DIAGNOSIS — T380X5S Adverse effect of glucocorticoids and synthetic analogues, sequela: Secondary | ICD-10-CM | POA: Diagnosis not present

## 2016-05-03 DIAGNOSIS — T85868D Thrombosis due to other internal prosthetic devices, implants and grafts, subsequent encounter: Secondary | ICD-10-CM | POA: Diagnosis not present

## 2016-05-04 DIAGNOSIS — E1129 Type 2 diabetes mellitus with other diabetic kidney complication: Secondary | ICD-10-CM | POA: Diagnosis not present

## 2016-05-04 DIAGNOSIS — D631 Anemia in chronic kidney disease: Secondary | ICD-10-CM | POA: Diagnosis not present

## 2016-05-04 DIAGNOSIS — N186 End stage renal disease: Secondary | ICD-10-CM | POA: Diagnosis not present

## 2016-05-04 DIAGNOSIS — N2581 Secondary hyperparathyroidism of renal origin: Secondary | ICD-10-CM | POA: Diagnosis not present

## 2016-05-04 DIAGNOSIS — Z23 Encounter for immunization: Secondary | ICD-10-CM | POA: Diagnosis not present

## 2016-05-04 DIAGNOSIS — T8579XA Infection and inflammatory reaction due to other internal prosthetic devices, implants and grafts, initial encounter: Secondary | ICD-10-CM | POA: Diagnosis not present

## 2016-05-05 DIAGNOSIS — N186 End stage renal disease: Secondary | ICD-10-CM | POA: Diagnosis not present

## 2016-05-05 DIAGNOSIS — T86819 Unspecified complication of lung transplant: Secondary | ICD-10-CM | POA: Diagnosis not present

## 2016-05-05 DIAGNOSIS — E8770 Fluid overload, unspecified: Secondary | ICD-10-CM | POA: Diagnosis not present

## 2016-05-05 DIAGNOSIS — Z992 Dependence on renal dialysis: Secondary | ICD-10-CM | POA: Diagnosis not present

## 2016-05-06 DIAGNOSIS — E1129 Type 2 diabetes mellitus with other diabetic kidney complication: Secondary | ICD-10-CM | POA: Diagnosis not present

## 2016-05-06 DIAGNOSIS — T8579XA Infection and inflammatory reaction due to other internal prosthetic devices, implants and grafts, initial encounter: Secondary | ICD-10-CM | POA: Diagnosis not present

## 2016-05-06 DIAGNOSIS — Z23 Encounter for immunization: Secondary | ICD-10-CM | POA: Diagnosis not present

## 2016-05-06 DIAGNOSIS — N186 End stage renal disease: Secondary | ICD-10-CM | POA: Diagnosis not present

## 2016-05-06 DIAGNOSIS — N2581 Secondary hyperparathyroidism of renal origin: Secondary | ICD-10-CM | POA: Diagnosis not present

## 2016-05-09 DIAGNOSIS — T8579XA Infection and inflammatory reaction due to other internal prosthetic devices, implants and grafts, initial encounter: Secondary | ICD-10-CM | POA: Diagnosis not present

## 2016-05-09 DIAGNOSIS — Z23 Encounter for immunization: Secondary | ICD-10-CM | POA: Diagnosis not present

## 2016-05-09 DIAGNOSIS — E1129 Type 2 diabetes mellitus with other diabetic kidney complication: Secondary | ICD-10-CM | POA: Diagnosis not present

## 2016-05-09 DIAGNOSIS — N186 End stage renal disease: Secondary | ICD-10-CM | POA: Diagnosis not present

## 2016-05-09 DIAGNOSIS — I82622 Acute embolism and thrombosis of deep veins of left upper extremity: Secondary | ICD-10-CM | POA: Diagnosis not present

## 2016-05-09 DIAGNOSIS — N2581 Secondary hyperparathyroidism of renal origin: Secondary | ICD-10-CM | POA: Diagnosis not present

## 2016-05-10 DIAGNOSIS — Z992 Dependence on renal dialysis: Secondary | ICD-10-CM | POA: Diagnosis not present

## 2016-05-10 DIAGNOSIS — N186 End stage renal disease: Secondary | ICD-10-CM | POA: Diagnosis not present

## 2016-05-10 DIAGNOSIS — I871 Compression of vein: Secondary | ICD-10-CM | POA: Diagnosis not present

## 2016-05-11 DIAGNOSIS — Z23 Encounter for immunization: Secondary | ICD-10-CM | POA: Diagnosis not present

## 2016-05-11 DIAGNOSIS — N186 End stage renal disease: Secondary | ICD-10-CM | POA: Diagnosis not present

## 2016-05-11 DIAGNOSIS — E1129 Type 2 diabetes mellitus with other diabetic kidney complication: Secondary | ICD-10-CM | POA: Diagnosis not present

## 2016-05-11 DIAGNOSIS — T8579XA Infection and inflammatory reaction due to other internal prosthetic devices, implants and grafts, initial encounter: Secondary | ICD-10-CM | POA: Diagnosis not present

## 2016-05-11 DIAGNOSIS — N2581 Secondary hyperparathyroidism of renal origin: Secondary | ICD-10-CM | POA: Diagnosis not present

## 2016-05-13 DIAGNOSIS — N2581 Secondary hyperparathyroidism of renal origin: Secondary | ICD-10-CM | POA: Diagnosis not present

## 2016-05-13 DIAGNOSIS — E1129 Type 2 diabetes mellitus with other diabetic kidney complication: Secondary | ICD-10-CM | POA: Diagnosis not present

## 2016-05-13 DIAGNOSIS — Z23 Encounter for immunization: Secondary | ICD-10-CM | POA: Diagnosis not present

## 2016-05-13 DIAGNOSIS — N186 End stage renal disease: Secondary | ICD-10-CM | POA: Diagnosis not present

## 2016-05-13 DIAGNOSIS — T8579XA Infection and inflammatory reaction due to other internal prosthetic devices, implants and grafts, initial encounter: Secondary | ICD-10-CM | POA: Diagnosis not present

## 2016-05-16 DIAGNOSIS — N186 End stage renal disease: Secondary | ICD-10-CM | POA: Diagnosis not present

## 2016-05-16 DIAGNOSIS — Z23 Encounter for immunization: Secondary | ICD-10-CM | POA: Diagnosis not present

## 2016-05-16 DIAGNOSIS — E1129 Type 2 diabetes mellitus with other diabetic kidney complication: Secondary | ICD-10-CM | POA: Diagnosis not present

## 2016-05-16 DIAGNOSIS — T8579XA Infection and inflammatory reaction due to other internal prosthetic devices, implants and grafts, initial encounter: Secondary | ICD-10-CM | POA: Diagnosis not present

## 2016-05-16 DIAGNOSIS — N2581 Secondary hyperparathyroidism of renal origin: Secondary | ICD-10-CM | POA: Diagnosis not present

## 2016-05-16 DIAGNOSIS — I82622 Acute embolism and thrombosis of deep veins of left upper extremity: Secondary | ICD-10-CM | POA: Diagnosis not present

## 2016-05-17 DIAGNOSIS — T86810 Lung transplant rejection: Secondary | ICD-10-CM | POA: Diagnosis not present

## 2016-05-18 DIAGNOSIS — N186 End stage renal disease: Secondary | ICD-10-CM | POA: Diagnosis not present

## 2016-05-18 DIAGNOSIS — Z23 Encounter for immunization: Secondary | ICD-10-CM | POA: Diagnosis not present

## 2016-05-18 DIAGNOSIS — T8579XA Infection and inflammatory reaction due to other internal prosthetic devices, implants and grafts, initial encounter: Secondary | ICD-10-CM | POA: Diagnosis not present

## 2016-05-18 DIAGNOSIS — E1129 Type 2 diabetes mellitus with other diabetic kidney complication: Secondary | ICD-10-CM | POA: Diagnosis not present

## 2016-05-18 DIAGNOSIS — I82622 Acute embolism and thrombosis of deep veins of left upper extremity: Secondary | ICD-10-CM | POA: Diagnosis not present

## 2016-05-18 DIAGNOSIS — N2581 Secondary hyperparathyroidism of renal origin: Secondary | ICD-10-CM | POA: Diagnosis not present

## 2016-05-19 DIAGNOSIS — I871 Compression of vein: Secondary | ICD-10-CM | POA: Diagnosis not present

## 2016-05-19 DIAGNOSIS — Z992 Dependence on renal dialysis: Secondary | ICD-10-CM | POA: Diagnosis not present

## 2016-05-19 DIAGNOSIS — N186 End stage renal disease: Secondary | ICD-10-CM | POA: Diagnosis not present

## 2016-05-19 DIAGNOSIS — T82858A Stenosis of vascular prosthetic devices, implants and grafts, initial encounter: Secondary | ICD-10-CM | POA: Diagnosis not present

## 2016-05-20 DIAGNOSIS — Z23 Encounter for immunization: Secondary | ICD-10-CM | POA: Diagnosis not present

## 2016-05-20 DIAGNOSIS — I82622 Acute embolism and thrombosis of deep veins of left upper extremity: Secondary | ICD-10-CM | POA: Diagnosis not present

## 2016-05-20 DIAGNOSIS — N186 End stage renal disease: Secondary | ICD-10-CM | POA: Diagnosis not present

## 2016-05-20 DIAGNOSIS — E1129 Type 2 diabetes mellitus with other diabetic kidney complication: Secondary | ICD-10-CM | POA: Diagnosis not present

## 2016-05-20 DIAGNOSIS — N2581 Secondary hyperparathyroidism of renal origin: Secondary | ICD-10-CM | POA: Diagnosis not present

## 2016-05-20 DIAGNOSIS — T8579XA Infection and inflammatory reaction due to other internal prosthetic devices, implants and grafts, initial encounter: Secondary | ICD-10-CM | POA: Diagnosis not present

## 2016-05-23 DIAGNOSIS — N2581 Secondary hyperparathyroidism of renal origin: Secondary | ICD-10-CM | POA: Diagnosis not present

## 2016-05-23 DIAGNOSIS — I82622 Acute embolism and thrombosis of deep veins of left upper extremity: Secondary | ICD-10-CM | POA: Diagnosis not present

## 2016-05-23 DIAGNOSIS — N186 End stage renal disease: Secondary | ICD-10-CM | POA: Diagnosis not present

## 2016-05-23 DIAGNOSIS — Z23 Encounter for immunization: Secondary | ICD-10-CM | POA: Diagnosis not present

## 2016-05-23 DIAGNOSIS — T8579XA Infection and inflammatory reaction due to other internal prosthetic devices, implants and grafts, initial encounter: Secondary | ICD-10-CM | POA: Diagnosis not present

## 2016-05-23 DIAGNOSIS — E1129 Type 2 diabetes mellitus with other diabetic kidney complication: Secondary | ICD-10-CM | POA: Diagnosis not present

## 2016-05-25 DIAGNOSIS — T8579XA Infection and inflammatory reaction due to other internal prosthetic devices, implants and grafts, initial encounter: Secondary | ICD-10-CM | POA: Diagnosis not present

## 2016-05-25 DIAGNOSIS — N186 End stage renal disease: Secondary | ICD-10-CM | POA: Diagnosis not present

## 2016-05-25 DIAGNOSIS — E1129 Type 2 diabetes mellitus with other diabetic kidney complication: Secondary | ICD-10-CM | POA: Diagnosis not present

## 2016-05-25 DIAGNOSIS — Z23 Encounter for immunization: Secondary | ICD-10-CM | POA: Diagnosis not present

## 2016-05-25 DIAGNOSIS — N2581 Secondary hyperparathyroidism of renal origin: Secondary | ICD-10-CM | POA: Diagnosis not present

## 2016-05-27 DIAGNOSIS — Z23 Encounter for immunization: Secondary | ICD-10-CM | POA: Diagnosis not present

## 2016-05-27 DIAGNOSIS — T8579XA Infection and inflammatory reaction due to other internal prosthetic devices, implants and grafts, initial encounter: Secondary | ICD-10-CM | POA: Diagnosis not present

## 2016-05-27 DIAGNOSIS — N186 End stage renal disease: Secondary | ICD-10-CM | POA: Diagnosis not present

## 2016-05-27 DIAGNOSIS — E1129 Type 2 diabetes mellitus with other diabetic kidney complication: Secondary | ICD-10-CM | POA: Diagnosis not present

## 2016-05-27 DIAGNOSIS — N2581 Secondary hyperparathyroidism of renal origin: Secondary | ICD-10-CM | POA: Diagnosis not present

## 2016-05-29 DIAGNOSIS — T8579XA Infection and inflammatory reaction due to other internal prosthetic devices, implants and grafts, initial encounter: Secondary | ICD-10-CM | POA: Diagnosis not present

## 2016-05-29 DIAGNOSIS — Z23 Encounter for immunization: Secondary | ICD-10-CM | POA: Diagnosis not present

## 2016-05-29 DIAGNOSIS — E1129 Type 2 diabetes mellitus with other diabetic kidney complication: Secondary | ICD-10-CM | POA: Diagnosis not present

## 2016-05-29 DIAGNOSIS — N2581 Secondary hyperparathyroidism of renal origin: Secondary | ICD-10-CM | POA: Diagnosis not present

## 2016-05-29 DIAGNOSIS — N186 End stage renal disease: Secondary | ICD-10-CM | POA: Diagnosis not present

## 2016-06-01 DIAGNOSIS — N2581 Secondary hyperparathyroidism of renal origin: Secondary | ICD-10-CM | POA: Diagnosis not present

## 2016-06-01 DIAGNOSIS — Z23 Encounter for immunization: Secondary | ICD-10-CM | POA: Diagnosis not present

## 2016-06-01 DIAGNOSIS — E1129 Type 2 diabetes mellitus with other diabetic kidney complication: Secondary | ICD-10-CM | POA: Diagnosis not present

## 2016-06-01 DIAGNOSIS — N186 End stage renal disease: Secondary | ICD-10-CM | POA: Diagnosis not present

## 2016-06-01 DIAGNOSIS — T8579XA Infection and inflammatory reaction due to other internal prosthetic devices, implants and grafts, initial encounter: Secondary | ICD-10-CM | POA: Diagnosis not present

## 2016-06-02 DIAGNOSIS — T86819 Unspecified complication of lung transplant: Secondary | ICD-10-CM | POA: Diagnosis not present

## 2016-06-02 DIAGNOSIS — N186 End stage renal disease: Secondary | ICD-10-CM | POA: Diagnosis not present

## 2016-06-02 DIAGNOSIS — Z79899 Other long term (current) drug therapy: Secondary | ICD-10-CM | POA: Diagnosis not present

## 2016-06-02 DIAGNOSIS — D899 Disorder involving the immune mechanism, unspecified: Secondary | ICD-10-CM | POA: Diagnosis not present

## 2016-06-02 DIAGNOSIS — Z942 Lung transplant status: Secondary | ICD-10-CM | POA: Diagnosis not present

## 2016-06-03 DIAGNOSIS — Z23 Encounter for immunization: Secondary | ICD-10-CM | POA: Diagnosis not present

## 2016-06-03 DIAGNOSIS — N2581 Secondary hyperparathyroidism of renal origin: Secondary | ICD-10-CM | POA: Diagnosis not present

## 2016-06-03 DIAGNOSIS — E1129 Type 2 diabetes mellitus with other diabetic kidney complication: Secondary | ICD-10-CM | POA: Diagnosis not present

## 2016-06-03 DIAGNOSIS — T8579XA Infection and inflammatory reaction due to other internal prosthetic devices, implants and grafts, initial encounter: Secondary | ICD-10-CM | POA: Diagnosis not present

## 2016-06-03 DIAGNOSIS — N186 End stage renal disease: Secondary | ICD-10-CM | POA: Diagnosis not present

## 2016-06-05 DIAGNOSIS — T8579XA Infection and inflammatory reaction due to other internal prosthetic devices, implants and grafts, initial encounter: Secondary | ICD-10-CM | POA: Diagnosis not present

## 2016-06-05 DIAGNOSIS — Z992 Dependence on renal dialysis: Secondary | ICD-10-CM | POA: Diagnosis not present

## 2016-06-05 DIAGNOSIS — N2581 Secondary hyperparathyroidism of renal origin: Secondary | ICD-10-CM | POA: Diagnosis not present

## 2016-06-05 DIAGNOSIS — E1129 Type 2 diabetes mellitus with other diabetic kidney complication: Secondary | ICD-10-CM | POA: Diagnosis not present

## 2016-06-05 DIAGNOSIS — N186 End stage renal disease: Secondary | ICD-10-CM | POA: Diagnosis not present

## 2016-06-05 DIAGNOSIS — T86819 Unspecified complication of lung transplant: Secondary | ICD-10-CM | POA: Diagnosis not present

## 2016-06-05 DIAGNOSIS — Z23 Encounter for immunization: Secondary | ICD-10-CM | POA: Diagnosis not present

## 2016-06-08 DIAGNOSIS — D631 Anemia in chronic kidney disease: Secondary | ICD-10-CM | POA: Diagnosis not present

## 2016-06-08 DIAGNOSIS — N186 End stage renal disease: Secondary | ICD-10-CM | POA: Diagnosis not present

## 2016-06-08 DIAGNOSIS — N2581 Secondary hyperparathyroidism of renal origin: Secondary | ICD-10-CM | POA: Diagnosis not present

## 2016-06-08 DIAGNOSIS — T8579XA Infection and inflammatory reaction due to other internal prosthetic devices, implants and grafts, initial encounter: Secondary | ICD-10-CM | POA: Diagnosis not present

## 2016-06-08 DIAGNOSIS — E1129 Type 2 diabetes mellitus with other diabetic kidney complication: Secondary | ICD-10-CM | POA: Diagnosis not present

## 2016-06-10 DIAGNOSIS — N2581 Secondary hyperparathyroidism of renal origin: Secondary | ICD-10-CM | POA: Diagnosis not present

## 2016-06-10 DIAGNOSIS — E1129 Type 2 diabetes mellitus with other diabetic kidney complication: Secondary | ICD-10-CM | POA: Diagnosis not present

## 2016-06-10 DIAGNOSIS — D631 Anemia in chronic kidney disease: Secondary | ICD-10-CM | POA: Diagnosis not present

## 2016-06-10 DIAGNOSIS — T8579XA Infection and inflammatory reaction due to other internal prosthetic devices, implants and grafts, initial encounter: Secondary | ICD-10-CM | POA: Diagnosis not present

## 2016-06-10 DIAGNOSIS — N186 End stage renal disease: Secondary | ICD-10-CM | POA: Diagnosis not present

## 2016-06-13 DIAGNOSIS — N186 End stage renal disease: Secondary | ICD-10-CM | POA: Diagnosis not present

## 2016-06-13 DIAGNOSIS — T8579XA Infection and inflammatory reaction due to other internal prosthetic devices, implants and grafts, initial encounter: Secondary | ICD-10-CM | POA: Diagnosis not present

## 2016-06-13 DIAGNOSIS — E1129 Type 2 diabetes mellitus with other diabetic kidney complication: Secondary | ICD-10-CM | POA: Diagnosis not present

## 2016-06-13 DIAGNOSIS — N2581 Secondary hyperparathyroidism of renal origin: Secondary | ICD-10-CM | POA: Diagnosis not present

## 2016-06-13 DIAGNOSIS — D631 Anemia in chronic kidney disease: Secondary | ICD-10-CM | POA: Diagnosis not present

## 2016-06-15 DIAGNOSIS — T8579XA Infection and inflammatory reaction due to other internal prosthetic devices, implants and grafts, initial encounter: Secondary | ICD-10-CM | POA: Diagnosis not present

## 2016-06-15 DIAGNOSIS — E1129 Type 2 diabetes mellitus with other diabetic kidney complication: Secondary | ICD-10-CM | POA: Diagnosis not present

## 2016-06-15 DIAGNOSIS — N186 End stage renal disease: Secondary | ICD-10-CM | POA: Diagnosis not present

## 2016-06-15 DIAGNOSIS — N2581 Secondary hyperparathyroidism of renal origin: Secondary | ICD-10-CM | POA: Diagnosis not present

## 2016-06-15 DIAGNOSIS — D631 Anemia in chronic kidney disease: Secondary | ICD-10-CM | POA: Diagnosis not present

## 2016-06-16 DIAGNOSIS — Z7952 Long term (current) use of systemic steroids: Secondary | ICD-10-CM | POA: Diagnosis not present

## 2016-06-16 DIAGNOSIS — T86819 Unspecified complication of lung transplant: Secondary | ICD-10-CM | POA: Diagnosis not present

## 2016-06-16 DIAGNOSIS — Z298 Encounter for other specified prophylactic measures: Secondary | ICD-10-CM | POA: Diagnosis not present

## 2016-06-16 DIAGNOSIS — N186 End stage renal disease: Secondary | ICD-10-CM | POA: Diagnosis not present

## 2016-06-16 DIAGNOSIS — Z942 Lung transplant status: Secondary | ICD-10-CM | POA: Diagnosis not present

## 2016-06-17 DIAGNOSIS — T8579XA Infection and inflammatory reaction due to other internal prosthetic devices, implants and grafts, initial encounter: Secondary | ICD-10-CM | POA: Diagnosis not present

## 2016-06-17 DIAGNOSIS — E1129 Type 2 diabetes mellitus with other diabetic kidney complication: Secondary | ICD-10-CM | POA: Diagnosis not present

## 2016-06-17 DIAGNOSIS — D631 Anemia in chronic kidney disease: Secondary | ICD-10-CM | POA: Diagnosis not present

## 2016-06-17 DIAGNOSIS — N186 End stage renal disease: Secondary | ICD-10-CM | POA: Diagnosis not present

## 2016-06-17 DIAGNOSIS — N2581 Secondary hyperparathyroidism of renal origin: Secondary | ICD-10-CM | POA: Diagnosis not present

## 2016-06-20 DIAGNOSIS — D631 Anemia in chronic kidney disease: Secondary | ICD-10-CM | POA: Diagnosis not present

## 2016-06-20 DIAGNOSIS — I82622 Acute embolism and thrombosis of deep veins of left upper extremity: Secondary | ICD-10-CM | POA: Diagnosis not present

## 2016-06-20 DIAGNOSIS — E1129 Type 2 diabetes mellitus with other diabetic kidney complication: Secondary | ICD-10-CM | POA: Diagnosis not present

## 2016-06-20 DIAGNOSIS — N186 End stage renal disease: Secondary | ICD-10-CM | POA: Diagnosis not present

## 2016-06-20 DIAGNOSIS — T8579XA Infection and inflammatory reaction due to other internal prosthetic devices, implants and grafts, initial encounter: Secondary | ICD-10-CM | POA: Diagnosis not present

## 2016-06-20 DIAGNOSIS — N2581 Secondary hyperparathyroidism of renal origin: Secondary | ICD-10-CM | POA: Diagnosis not present

## 2016-06-21 DIAGNOSIS — T86810 Lung transplant rejection: Secondary | ICD-10-CM | POA: Diagnosis not present

## 2016-06-23 DIAGNOSIS — N2581 Secondary hyperparathyroidism of renal origin: Secondary | ICD-10-CM | POA: Diagnosis not present

## 2016-06-23 DIAGNOSIS — T8579XA Infection and inflammatory reaction due to other internal prosthetic devices, implants and grafts, initial encounter: Secondary | ICD-10-CM | POA: Diagnosis not present

## 2016-06-23 DIAGNOSIS — D631 Anemia in chronic kidney disease: Secondary | ICD-10-CM | POA: Diagnosis not present

## 2016-06-23 DIAGNOSIS — E1129 Type 2 diabetes mellitus with other diabetic kidney complication: Secondary | ICD-10-CM | POA: Diagnosis not present

## 2016-06-23 DIAGNOSIS — N186 End stage renal disease: Secondary | ICD-10-CM | POA: Diagnosis not present

## 2016-06-24 ENCOUNTER — Emergency Department (HOSPITAL_COMMUNITY): Payer: Medicare Other

## 2016-06-24 ENCOUNTER — Emergency Department (HOSPITAL_COMMUNITY)
Admission: EM | Admit: 2016-06-24 | Discharge: 2016-06-24 | Disposition: A | Discharge status: Home Health | Payer: Medicare Other | Attending: Emergency Medicine | Admitting: Emergency Medicine

## 2016-06-24 ENCOUNTER — Encounter (HOSPITAL_COMMUNITY): Payer: Self-pay

## 2016-06-24 DIAGNOSIS — Z992 Dependence on renal dialysis: Secondary | ICD-10-CM | POA: Diagnosis not present

## 2016-06-24 DIAGNOSIS — I120 Hypertensive chronic kidney disease with stage 5 chronic kidney disease or end stage renal disease: Secondary | ICD-10-CM | POA: Insufficient documentation

## 2016-06-24 DIAGNOSIS — S299XXA Unspecified injury of thorax, initial encounter: Secondary | ICD-10-CM | POA: Diagnosis not present

## 2016-06-24 DIAGNOSIS — Z794 Long term (current) use of insulin: Secondary | ICD-10-CM | POA: Diagnosis not present

## 2016-06-24 DIAGNOSIS — Z79899 Other long term (current) drug therapy: Secondary | ICD-10-CM | POA: Diagnosis not present

## 2016-06-24 DIAGNOSIS — N186 End stage renal disease: Secondary | ICD-10-CM | POA: Diagnosis not present

## 2016-06-24 DIAGNOSIS — Z7982 Long term (current) use of aspirin: Secondary | ICD-10-CM | POA: Insufficient documentation

## 2016-06-24 DIAGNOSIS — R42 Dizziness and giddiness: Secondary | ICD-10-CM

## 2016-06-24 DIAGNOSIS — R55 Syncope and collapse: Secondary | ICD-10-CM | POA: Diagnosis not present

## 2016-06-24 DIAGNOSIS — Z87891 Personal history of nicotine dependence: Secondary | ICD-10-CM | POA: Insufficient documentation

## 2016-06-24 DIAGNOSIS — E1122 Type 2 diabetes mellitus with diabetic chronic kidney disease: Secondary | ICD-10-CM | POA: Insufficient documentation

## 2016-06-24 DIAGNOSIS — R404 Transient alteration of awareness: Secondary | ICD-10-CM | POA: Diagnosis not present

## 2016-06-24 LAB — CBC WITH DIFFERENTIAL/PLATELET
BASOS ABS: 0.1 10*3/uL (ref 0.0–0.1)
Basophils Relative: 1 %
Eosinophils Absolute: 0.1 10*3/uL (ref 0.0–0.7)
Eosinophils Relative: 1 %
HEMATOCRIT: 34 % — AB (ref 39.0–52.0)
Hemoglobin: 11.6 g/dL — ABNORMAL LOW (ref 13.0–17.0)
LYMPHS ABS: 1.2 10*3/uL (ref 0.7–4.0)
Lymphocytes Relative: 21 %
MCH: 37.9 pg — ABNORMAL HIGH (ref 26.0–34.0)
MCHC: 34.1 g/dL (ref 30.0–36.0)
MCV: 111.1 fL — AB (ref 78.0–100.0)
MONO ABS: 0.5 10*3/uL (ref 0.1–1.0)
MONOS PCT: 8 %
NEUTROS PCT: 69 %
Neutro Abs: 3.9 10*3/uL (ref 1.7–7.7)
PLATELETS: 105 10*3/uL — AB (ref 150–400)
RBC: 3.06 MIL/uL — AB (ref 4.22–5.81)
RDW: 17.8 % — ABNORMAL HIGH (ref 11.5–15.5)
WBC: 5.8 10*3/uL (ref 4.0–10.5)

## 2016-06-24 LAB — COMPREHENSIVE METABOLIC PANEL
ALBUMIN: 2.6 g/dL — AB (ref 3.5–5.0)
ALT: 37 U/L (ref 17–63)
AST: 70 U/L — AB (ref 15–41)
Alkaline Phosphatase: 170 U/L — ABNORMAL HIGH (ref 38–126)
Anion gap: 14 (ref 5–15)
BUN: 43 mg/dL — AB (ref 6–20)
CHLORIDE: 104 mmol/L (ref 101–111)
CO2: 23 mmol/L (ref 22–32)
CREATININE: 7.48 mg/dL — AB (ref 0.61–1.24)
Calcium: 8.4 mg/dL — ABNORMAL LOW (ref 8.9–10.3)
GFR calc Af Amer: 8 mL/min — ABNORMAL LOW (ref >60)
GFR, EST NON AFRICAN AMERICAN: 7 mL/min — AB (ref >60)
GLUCOSE: 59 mg/dL — AB (ref 65–99)
Potassium: 3.2 mmol/L — ABNORMAL LOW (ref 3.5–5.1)
Sodium: 141 mmol/L (ref 135–145)
Total Bilirubin: 1.4 mg/dL — ABNORMAL HIGH (ref 0.3–1.2)
Total Protein: 6.2 g/dL — ABNORMAL LOW (ref 6.5–8.1)

## 2016-06-24 MED ORDER — HYDROCOD POLST-CPM POLST ER 10-8 MG/5ML PO SUER: 5.0000 mL | Freq: Two times a day (BID) | ORAL | 0 refills | Status: DC

## 2016-06-24 MED ORDER — PREDNISONE 20 MG PO TABS: 20.0000 mg | ORAL_TABLET | Freq: Two times a day (BID) | ORAL | 0 refills | Status: DC

## 2016-06-24 MED ORDER — ALBUTEROL SULFATE HFA 108 (90 BASE) MCG/ACT IN AERS: 1.0000 | INHALATION_SPRAY | Freq: Four times a day (QID) | RESPIRATORY_TRACT | 0 refills | Status: DC | PRN

## 2016-06-24 MED ORDER — ALBUTEROL (5 MG/ML) CONTINUOUS INHALATION SOLN: 10.0000 mg/h | INHALATION_SOLUTION | Freq: Once | RESPIRATORY_TRACT | Status: DC

## 2016-06-24 NOTE — ED Triage Notes (Signed)
PT went in for dialysis today when he fell back a little and felt as though he were going to pass out today was his second day in a row for dialysis due to missing dialysis during the week

## 2016-06-24 NOTE — Discharge Instructions (Addendum)
Follow-up with your primary care physician.  Return to ER with any worsening symptoms

## 2016-06-24 NOTE — ED Provider Notes (Signed)
[facility] DEPT Provider Note   CSN: [id number] Arrival date & time: 06/24/16  1407     History   Chief Complaint Chief Complaint  Patient presents with  . Near Syncope    near syncope while walking in to Dialysis pt had dialysis yesterday     HPI [name] is a 66 y.o. male. Chief complaint is dizzy episode.  HPI:  Patient presents for evaluation after a dizzy episode at dialysis today.    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 Is getting Monday, Wednesday, Friday maintenance hemodialysis for end-stage renal disease. Due the snow on Wednesday he missed Wednesday. He went yesterday Thursday and had a normal run. He was back today. He states that yesterday he was 4 kg up on his weight. He was not overdiuresis to his knowledge. He felt like it was "just the usual".  I called his name and he stood up to go to the back states when he stood he felt dizzy and lightheaded and had a hold onto the doorway. They became concerned and called 911 and he was brought here. He states he feels normal here. He states he feels like he's been "breathing shallowly" today but otherwise no complaints.  Past Medical History:  Diagnosis Date  . Aortic valve disorders   . Benign neoplasm of colon   . Degeneration of intervertebral disc, site unspecified   . Diabetes mellitus without complication ([place])   . 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 Esophageal reflux   . ESRD (end stage renal disease) on dialysis ([place])   . Essential hypertension   . Obstructive sleep apnea (adult) (pediatric)   . Osteoarthrosis, unspecified whether generalized or localized, unspecified site   . Other and unspecified hyperlipidemia   . Pneumonia    interstitial pneumonia  . Pulmonary fibrosis ([place])   . Renal disorder   . Respiratory failure with hypoxia ([place]) 12/2015  . Shortness of breath dyspnea   . Transplanted, lung ([place])   . Unspecified  essential hypertension     Patient Active Problem List   Diagnosis Date Noted  . Sepsis ([place])   . CKD (chronic kidney disease)   . Generalized weakness 12/30/2015  . ESRD (end stage renal disease) on dialysis ([place])   . 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[place])   . Acute respiratory failure with hypoxemia ([place]) 04/18/2015  . Septic shock ([place]) 04/18/2015  . Acute encephalopathy 04/18/2015  . Acute respiratory failure ([place]) 04/18/2015  . Cardiac arrest ([place])   . HCAP (healthcare-associated pneumonia)   . Elevated rheumatoid factor 05/09/2014  . Essential hypertension 05/09/2014  . ILD (interstitial lung disease) ([place]) 05/09/2014  . Obstructive apnea 05/09/2014  . Lung nodule, solitary 04/18/2014  . Awaiting organ transplant 04/18/2014  . Acute on chronic respiratory failure with hypoxia ([place]) 08/15/2013  . Edema 07/05/2013  . Chronic respiratory failure ([place]) 03/03/2013  . Diabetes mellitus with complication ([place]) 42/35/3614  . Acute sinusitis 05/04/2011  . Cough 11/10/2010  . 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 Obstructive sleep apnea 10/09/2008  . ACID REFLUX DISEASE 10/09/2008  . HIATAL HERNIA 10/09/2008  . OSTEOARTHRITIS 10/09/2008    Past Surgical History:  Procedure Laterality Date  . LUNG BIOPSY  2010  . LUNG TRANSPLANT, SINGLE Right        Home Medications    Prior to Admission medications   Medication Sig Start Date End Date Taking? Authorizing Provider  ACCU-CHEK FASTCLIX LANCETS MISC As  directed up to 4 times daily 12/16/15  Yes Historical Provider, MD  acetaminophen (TYLENOL) 160 MG/5ML solution Place 20.3 mLs (650 mg total) into feeding tube every 6 (six) hours as needed for mild pain, headache or fever. Patient taking differently: Take 650 mg by mouth every 6 (six) hours as needed for mild pain, headache or fever.  04/19/15  Yes Erick Colace, NP  acetaminophen (TYLENOL) 500 MG tablet Take 500-1,000 mg by mouth every 6 (six) hours as needed for  headache.   Yes Historical Provider, MD  amLODipine (NORVASC) 5 MG tablet Take 2.5 mg by mouth daily. 05/03/16 05/03/17 Yes Historical Provider, MD  aspirin 81 MG tablet Take 81 mg by mouth daily.    Yes Historical Provider, MD  azaTHIOprine (IMURAN) 50 MG tablet Take 25 mg by mouth daily.   Yes Historical Provider, MD  calcium acetate (PHOSLO) 667 MG capsule Take 1,334 mg by mouth 3 (three) times daily before meals.    Yes Historical Provider, MD  clonazePAM (KLONOPIN) 0.5 MG tablet Take 0.25 mg by mouth at bedtime as needed.   Yes Historical Provider, MD  cycloSPORINE modified (NEORAL) 25 MG capsule Take 75 mg by mouth 2 (two) times daily.    Yes Historical Provider, MD  fluticasone (FLONASE) 50 MCG/ACT nasal spray Place 2 sprays into the nose daily. Patient taking differently: Place 2 sprays into the nose daily as needed for allergies.  03/05/13  Yes Wenda Low, MD  insulin regular (NOVOLIN R) 100 units/mL injection Inject 5 Units into the skin 3 (three) times daily before meals. Additional units (per sliding scale): BGL 201-250 = 1 unit; 251-300 = 2 units; 301-350 = 3 units; 351-400 = units; >401 = 5 units + CALL LUNG COORDINATOR @ [facility]   Yes Historical Provider, MD  latanoprost (XALATAN) 0.005 % ophthalmic solution Place 1 drop into both eyes at bedtime.   Yes Historical Provider, MD  midodrine (PROAMATINE) 5 MG tablet Take 5 mg by mouth every Monday, Wednesday, and Friday. Only prior to dialysis ( Monday Wednesday Friday )   Yes Historical Provider, MD  Multiple Vitamin (MULTIVITAMIN WITH MINERALS) TABS tablet Take 1 tablet by mouth daily.   Yes Historical Provider, MD  omeprazole (PRILOSEC) 20 MG capsule Take 20 mg by mouth daily.   Yes Historical Provider, MD  ondansetron (ZOFRAN) 4 MG tablet Take 4 mg by mouth every 8 (eight) hours as needed for nausea or vomiting.  09/04/15  Yes Historical Provider, MD  polyvinyl alcohol-povidone (REFRESH) 1.4-0.6 % ophthalmic solution Place 1-2 drops into  both eyes daily as needed (for dryness).   Yes Historical Provider, MD  pravastatin (PRAVACHOL) 20 MG tablet Take 20 mg by mouth every evening.    Yes Historical Provider, MD  sertraline (ZOLOFT) 50 MG tablet Take 50 mg by mouth daily.   Yes Historical Provider, MD  sulfamethoxazole-trimethoprim (BACTRIM DS,SEPTRA DS) 800-160 MG per tablet Take 1 tablet by mouth daily. Patient taking differently: Take 1 tablet by mouth every Monday, Wednesday, and Friday.  05/26/14  Yes Kathee Delton, MD  valGANciclovir (VALCYTE) 450 MG tablet Take 1 tablet by mouth See admin instructions. Every Monday and Friday AFTER DIALYSIS 03/26/15  Yes Historical Provider, MD  azaTHIOprine (IMURAN) 50 MG tablet take 3 tablets by mouth once daily Patient not taking: Reported on 06/24/2016 11/26/13   Kathee Delton, MD  glucose blood test strip As directed 12/16/15   Historical Provider, MD  insulin aspart (NOVOLOG) 100 UNIT/ML injection Inject 2-6  Units into the skin every 4 (four) hours. Patient not taking: Reported on 12/31/2015 04/18/15   Erick Colace, NP  Lancets MISC As directed 12/16/15   Historical Provider, MD  posaconazole (NOXAFIL) 100 MG TBEC delayed-release tablet Take 300 mg by mouth every evening. 09/05/15   Historical Provider, MD    Family History Family History  Problem Relation Age of Onset  . Pancreatic cancer Brother   . Heart disease Father     Social History Social History  Substance Use Topics  . Smoking status: Former Smoker    Packs/day: 0.30    Years: 20.00    Types: Cigarettes    Quit date: 06/06/2002  . Smokeless tobacco: Never Used  . Alcohol use No     Allergies   Levofloxacin and Nsaids   Review of Systems Review of Systems  Constitutional: Negative for appetite change, chills, diaphoresis, fatigue and fever.  HENT: Negative for mouth sores, sore throat and trouble swallowing.   Eyes: Negative for visual disturbance.  Respiratory: Negative for cough, chest tightness,  shortness of breath and wheezing.   Cardiovascular: Negative for chest pain.  Gastrointestinal: Negative for abdominal distention, abdominal pain, diarrhea, nausea and vomiting.  Endocrine: Negative for polydipsia, polyphagia and polyuria.  Genitourinary: Negative for dysuria, frequency and hematuria.  Musculoskeletal: Negative for gait problem.  Skin: Negative for color change, pallor and rash.  Neurological: Positive for dizziness. Negative for light-headedness and headaches.  Hematological: Does not bruise/bleed easily.  Psychiatric/Behavioral: Negative for behavioral problems and confusion.     Physical Exam Updated Vital Signs BP 161/89 (BP Location: Right Arm)   Pulse 76   Temp 97.6 F (36.4 C)   Resp 16   Ht '5\' 4"'$  (1.626 m)   Wt 123 lb (55.8 kg)   SpO2 100%   BMI 21.11 kg/m   Physical Exam  Constitutional: He is oriented to person, place, and time. He appears well-developed and well-nourished. No distress.  HENT:  Head: Normocephalic.  Eyes: Conjunctivae are normal. Pupils are equal, round, and reactive to light. No scleral icterus.  Neck: Normal range of motion. Neck supple. No thyromegaly present.  Cardiovascular: Normal rate and regular rhythm.  Exam reveals no gallop and no friction rub.   No murmur heard. Sinus rhythm. Not tachycardic. 100% saturations. BP 140/90. No peripheral edema.  Pulmonary/Chest: Effort normal and breath sounds normal. No respiratory distress. He has no wheezes. He has no rales.  Shallow respirations. Clear breath sounds.  Abdominal: Soft. Bowel sounds are normal. He exhibits no distension. There is no tenderness. There is no rebound.  Musculoskeletal: Normal range of motion.  Neurological: He is alert and oriented to person, place, and time.  Skin: Skin is warm and dry. No rash noted.  Psychiatric: He has a normal mood and affect. His behavior is normal.     ED Treatments / Results  Labs (all labs ordered are listed, but only abnormal  results are displayed) Labs Reviewed  CBC WITH DIFFERENTIAL/PLATELET - Abnormal; Notable for the following:       Result Value   RBC 3.06 (*)    Hemoglobin 11.6 (*)    HCT 34.0 (*)    MCV 111.1 (*)    MCH 37.9 (*)    RDW 17.8 (*)    All other components within normal limits  COMPREHENSIVE METABOLIC PANEL    EKG  EKG Interpretation  Date/Time:  Friday June 24 2016 14:14:24 EST Ventricular Rate:  76 PR Interval:    QRS  Duration: 109 QT Interval:  438 QTC Calculation: 493 R Axis:   12 Text Interpretation:  Sinus rhythm Probable left atrial enlargement RSR' in V1 or V2, right VCD or RVH Borderline prolonged QT interval Confirmed by Jeneen Rinks  MD, [place] ([id number]) on 06/24/2016 2:18:01 PM       Radiology Dg Chest 2 View  Result Date: 06/24/2016 CLINICAL DATA:  Status post fall.  History of lung transplant. EXAM: CHEST  2 VIEW COMPARISON:  03/21/2016 FINDINGS: There is evidence of prior right lung transplant with surgical clips in the right hilum. Extensive interstitial pulmonary fibrosis of the left lung with volume loss. Compensatory hyperinflation is noted of fifth right lung. No pleural effusion or pneumothorax. Stable cardiomediastinal silhouette. Mediastinal shift towards the left. The osseous structures are unremarkable. IMPRESSION: 1. No active cardiopulmonary disease. 2. Chronic interstitial pulmonary fibrosis of the left lung. Electronically Signed   By: Kathreen Devoid   On: 06/24/2016 15:19    Procedures Procedures (including critical care time)  Medications Ordered in ED Medications - No data to display   Initial Impression / Assessment and Plan / ED Course  I have reviewed the triage vital signs and the nursing notes.  Pertinent labs & imaging results that were available during my care of the patient were reviewed by me and considered in my medical decision making (see chart for details).     Will check chest x-ray for fluid overload although clinically does not sound  like it. No edema. Evaluate potassium. EKG shows sinus rhythm without ectopy. We'll reevaluate after labs, x-ray.  Final Clinical Impressions(s) / ED Diagnoses   Final diagnoses:  Dizziness    X-ray shows no CHF. EKG normal. Labs pending. Patient feeling well. Is eaten and feels normal. If potassium is normal would be appropriate for discharge.  New Prescriptions Current Discharge Medication List       Tanna Furry, MD 06/24/16 [phone]

## 2016-06-24 NOTE — ED Notes (Signed)
Patient transported to X-ray 

## 2016-06-24 NOTE — ED Notes (Signed)
Patient transported back from X-ray 

## 2016-06-25 DIAGNOSIS — T8579XA Infection and inflammatory reaction due to other internal prosthetic devices, implants and grafts, initial encounter: Secondary | ICD-10-CM | POA: Diagnosis not present

## 2016-06-25 DIAGNOSIS — D631 Anemia in chronic kidney disease: Secondary | ICD-10-CM | POA: Diagnosis not present

## 2016-06-25 DIAGNOSIS — E1129 Type 2 diabetes mellitus with other diabetic kidney complication: Secondary | ICD-10-CM | POA: Diagnosis not present

## 2016-06-25 DIAGNOSIS — N186 End stage renal disease: Secondary | ICD-10-CM | POA: Diagnosis not present

## 2016-06-25 DIAGNOSIS — N2581 Secondary hyperparathyroidism of renal origin: Secondary | ICD-10-CM | POA: Diagnosis not present

## 2016-06-27 DIAGNOSIS — D631 Anemia in chronic kidney disease: Secondary | ICD-10-CM | POA: Diagnosis not present

## 2016-06-27 DIAGNOSIS — E1129 Type 2 diabetes mellitus with other diabetic kidney complication: Secondary | ICD-10-CM | POA: Diagnosis not present

## 2016-06-27 DIAGNOSIS — N2581 Secondary hyperparathyroidism of renal origin: Secondary | ICD-10-CM | POA: Diagnosis not present

## 2016-06-27 DIAGNOSIS — N186 End stage renal disease: Secondary | ICD-10-CM | POA: Diagnosis not present

## 2016-06-27 DIAGNOSIS — T8579XA Infection and inflammatory reaction due to other internal prosthetic devices, implants and grafts, initial encounter: Secondary | ICD-10-CM | POA: Diagnosis not present

## 2016-06-29 DIAGNOSIS — T8579XA Infection and inflammatory reaction due to other internal prosthetic devices, implants and grafts, initial encounter: Secondary | ICD-10-CM | POA: Diagnosis not present

## 2016-06-29 DIAGNOSIS — E1129 Type 2 diabetes mellitus with other diabetic kidney complication: Secondary | ICD-10-CM | POA: Diagnosis not present

## 2016-06-29 DIAGNOSIS — N2581 Secondary hyperparathyroidism of renal origin: Secondary | ICD-10-CM | POA: Diagnosis not present

## 2016-06-29 DIAGNOSIS — N186 End stage renal disease: Secondary | ICD-10-CM | POA: Diagnosis not present

## 2016-06-29 DIAGNOSIS — D631 Anemia in chronic kidney disease: Secondary | ICD-10-CM | POA: Diagnosis not present

## 2016-07-01 DIAGNOSIS — T8579XA Infection and inflammatory reaction due to other internal prosthetic devices, implants and grafts, initial encounter: Secondary | ICD-10-CM | POA: Diagnosis not present

## 2016-07-01 DIAGNOSIS — N2581 Secondary hyperparathyroidism of renal origin: Secondary | ICD-10-CM | POA: Diagnosis not present

## 2016-07-01 DIAGNOSIS — N186 End stage renal disease: Secondary | ICD-10-CM | POA: Diagnosis not present

## 2016-07-01 DIAGNOSIS — E1129 Type 2 diabetes mellitus with other diabetic kidney complication: Secondary | ICD-10-CM | POA: Diagnosis not present

## 2016-07-01 DIAGNOSIS — D631 Anemia in chronic kidney disease: Secondary | ICD-10-CM | POA: Diagnosis not present

## 2016-07-04 DIAGNOSIS — D631 Anemia in chronic kidney disease: Secondary | ICD-10-CM | POA: Diagnosis not present

## 2016-07-04 DIAGNOSIS — I82622 Acute embolism and thrombosis of deep veins of left upper extremity: Secondary | ICD-10-CM | POA: Diagnosis not present

## 2016-07-04 DIAGNOSIS — N2581 Secondary hyperparathyroidism of renal origin: Secondary | ICD-10-CM | POA: Diagnosis not present

## 2016-07-04 DIAGNOSIS — E1129 Type 2 diabetes mellitus with other diabetic kidney complication: Secondary | ICD-10-CM | POA: Diagnosis not present

## 2016-07-04 DIAGNOSIS — T8579XA Infection and inflammatory reaction due to other internal prosthetic devices, implants and grafts, initial encounter: Secondary | ICD-10-CM | POA: Diagnosis not present

## 2016-07-04 DIAGNOSIS — N186 End stage renal disease: Secondary | ICD-10-CM | POA: Diagnosis not present

## 2016-07-05 DIAGNOSIS — Z942 Lung transplant status: Secondary | ICD-10-CM | POA: Diagnosis not present

## 2016-07-05 DIAGNOSIS — I120 Hypertensive chronic kidney disease with stage 5 chronic kidney disease or end stage renal disease: Secondary | ICD-10-CM | POA: Diagnosis not present

## 2016-07-05 DIAGNOSIS — G4700 Insomnia, unspecified: Secondary | ICD-10-CM | POA: Diagnosis not present

## 2016-07-05 DIAGNOSIS — J849 Interstitial pulmonary disease, unspecified: Secondary | ICD-10-CM | POA: Diagnosis not present

## 2016-07-05 DIAGNOSIS — D72819 Decreased white blood cell count, unspecified: Secondary | ICD-10-CM | POA: Diagnosis not present

## 2016-07-05 DIAGNOSIS — R251 Tremor, unspecified: Secondary | ICD-10-CM | POA: Diagnosis not present

## 2016-07-05 DIAGNOSIS — N186 End stage renal disease: Secondary | ICD-10-CM | POA: Diagnosis not present

## 2016-07-05 DIAGNOSIS — T82868D Thrombosis of vascular prosthetic devices, implants and grafts, subsequent encounter: Secondary | ICD-10-CM | POA: Diagnosis not present

## 2016-07-05 DIAGNOSIS — D899 Disorder involving the immune mechanism, unspecified: Secondary | ICD-10-CM | POA: Diagnosis not present

## 2016-07-05 DIAGNOSIS — N185 Chronic kidney disease, stage 5: Secondary | ICD-10-CM | POA: Diagnosis not present

## 2016-07-05 DIAGNOSIS — Z4824 Encounter for aftercare following lung transplant: Secondary | ICD-10-CM | POA: Diagnosis not present

## 2016-07-05 DIAGNOSIS — D631 Anemia in chronic kidney disease: Secondary | ICD-10-CM | POA: Diagnosis not present

## 2016-07-05 DIAGNOSIS — R6881 Early satiety: Secondary | ICD-10-CM | POA: Diagnosis not present

## 2016-07-05 DIAGNOSIS — Z992 Dependence on renal dialysis: Secondary | ICD-10-CM | POA: Diagnosis not present

## 2016-07-05 DIAGNOSIS — K274 Chronic or unspecified peptic ulcer, site unspecified, with hemorrhage: Secondary | ICD-10-CM | POA: Diagnosis not present

## 2016-07-06 DIAGNOSIS — N186 End stage renal disease: Secondary | ICD-10-CM | POA: Diagnosis not present

## 2016-07-06 DIAGNOSIS — D631 Anemia in chronic kidney disease: Secondary | ICD-10-CM | POA: Diagnosis not present

## 2016-07-06 DIAGNOSIS — T8579XA Infection and inflammatory reaction due to other internal prosthetic devices, implants and grafts, initial encounter: Secondary | ICD-10-CM | POA: Diagnosis not present

## 2016-07-06 DIAGNOSIS — N2581 Secondary hyperparathyroidism of renal origin: Secondary | ICD-10-CM | POA: Diagnosis not present

## 2016-07-06 DIAGNOSIS — T86819 Unspecified complication of lung transplant: Secondary | ICD-10-CM | POA: Diagnosis not present

## 2016-07-06 DIAGNOSIS — Z992 Dependence on renal dialysis: Secondary | ICD-10-CM | POA: Diagnosis not present

## 2016-07-06 DIAGNOSIS — E1129 Type 2 diabetes mellitus with other diabetic kidney complication: Secondary | ICD-10-CM | POA: Diagnosis not present

## 2016-07-08 DIAGNOSIS — T8579XA Infection and inflammatory reaction due to other internal prosthetic devices, implants and grafts, initial encounter: Secondary | ICD-10-CM | POA: Diagnosis not present

## 2016-07-08 DIAGNOSIS — E1129 Type 2 diabetes mellitus with other diabetic kidney complication: Secondary | ICD-10-CM | POA: Diagnosis not present

## 2016-07-08 DIAGNOSIS — E876 Hypokalemia: Secondary | ICD-10-CM | POA: Diagnosis not present

## 2016-07-08 DIAGNOSIS — D631 Anemia in chronic kidney disease: Secondary | ICD-10-CM | POA: Diagnosis not present

## 2016-07-08 DIAGNOSIS — N186 End stage renal disease: Secondary | ICD-10-CM | POA: Diagnosis not present

## 2016-07-08 DIAGNOSIS — N2581 Secondary hyperparathyroidism of renal origin: Secondary | ICD-10-CM | POA: Diagnosis not present

## 2016-07-11 DIAGNOSIS — N186 End stage renal disease: Secondary | ICD-10-CM | POA: Diagnosis not present

## 2016-07-11 DIAGNOSIS — E1129 Type 2 diabetes mellitus with other diabetic kidney complication: Secondary | ICD-10-CM | POA: Diagnosis not present

## 2016-07-11 DIAGNOSIS — N2581 Secondary hyperparathyroidism of renal origin: Secondary | ICD-10-CM | POA: Diagnosis not present

## 2016-07-11 DIAGNOSIS — D631 Anemia in chronic kidney disease: Secondary | ICD-10-CM | POA: Diagnosis not present

## 2016-07-11 DIAGNOSIS — E876 Hypokalemia: Secondary | ICD-10-CM | POA: Diagnosis not present

## 2016-07-11 DIAGNOSIS — I82622 Acute embolism and thrombosis of deep veins of left upper extremity: Secondary | ICD-10-CM | POA: Diagnosis not present

## 2016-07-11 DIAGNOSIS — T8579XA Infection and inflammatory reaction due to other internal prosthetic devices, implants and grafts, initial encounter: Secondary | ICD-10-CM | POA: Diagnosis not present

## 2016-07-13 DIAGNOSIS — Z79899 Other long term (current) drug therapy: Secondary | ICD-10-CM | POA: Diagnosis not present

## 2016-07-13 DIAGNOSIS — D631 Anemia in chronic kidney disease: Secondary | ICD-10-CM | POA: Diagnosis not present

## 2016-07-13 DIAGNOSIS — Z942 Lung transplant status: Secondary | ICD-10-CM | POA: Diagnosis not present

## 2016-07-13 DIAGNOSIS — N186 End stage renal disease: Secondary | ICD-10-CM | POA: Diagnosis not present

## 2016-07-13 DIAGNOSIS — N2581 Secondary hyperparathyroidism of renal origin: Secondary | ICD-10-CM | POA: Diagnosis not present

## 2016-07-13 DIAGNOSIS — E1129 Type 2 diabetes mellitus with other diabetic kidney complication: Secondary | ICD-10-CM | POA: Diagnosis not present

## 2016-07-13 DIAGNOSIS — T86819 Unspecified complication of lung transplant: Secondary | ICD-10-CM | POA: Diagnosis not present

## 2016-07-13 DIAGNOSIS — Z418 Encounter for other procedures for purposes other than remedying health state: Secondary | ICD-10-CM | POA: Diagnosis not present

## 2016-07-13 DIAGNOSIS — N189 Chronic kidney disease, unspecified: Secondary | ICD-10-CM | POA: Diagnosis not present

## 2016-07-13 DIAGNOSIS — T8579XA Infection and inflammatory reaction due to other internal prosthetic devices, implants and grafts, initial encounter: Secondary | ICD-10-CM | POA: Diagnosis not present

## 2016-07-13 DIAGNOSIS — E876 Hypokalemia: Secondary | ICD-10-CM | POA: Diagnosis not present

## 2016-07-15 DIAGNOSIS — E1129 Type 2 diabetes mellitus with other diabetic kidney complication: Secondary | ICD-10-CM | POA: Diagnosis not present

## 2016-07-15 DIAGNOSIS — T8579XA Infection and inflammatory reaction due to other internal prosthetic devices, implants and grafts, initial encounter: Secondary | ICD-10-CM | POA: Diagnosis not present

## 2016-07-15 DIAGNOSIS — N2581 Secondary hyperparathyroidism of renal origin: Secondary | ICD-10-CM | POA: Diagnosis not present

## 2016-07-15 DIAGNOSIS — E876 Hypokalemia: Secondary | ICD-10-CM | POA: Diagnosis not present

## 2016-07-15 DIAGNOSIS — N186 End stage renal disease: Secondary | ICD-10-CM | POA: Diagnosis not present

## 2016-07-15 DIAGNOSIS — D631 Anemia in chronic kidney disease: Secondary | ICD-10-CM | POA: Diagnosis not present

## 2016-07-18 DIAGNOSIS — D631 Anemia in chronic kidney disease: Secondary | ICD-10-CM | POA: Diagnosis not present

## 2016-07-18 DIAGNOSIS — N2581 Secondary hyperparathyroidism of renal origin: Secondary | ICD-10-CM | POA: Diagnosis not present

## 2016-07-18 DIAGNOSIS — T8579XA Infection and inflammatory reaction due to other internal prosthetic devices, implants and grafts, initial encounter: Secondary | ICD-10-CM | POA: Diagnosis not present

## 2016-07-18 DIAGNOSIS — N186 End stage renal disease: Secondary | ICD-10-CM | POA: Diagnosis not present

## 2016-07-18 DIAGNOSIS — E1129 Type 2 diabetes mellitus with other diabetic kidney complication: Secondary | ICD-10-CM | POA: Diagnosis not present

## 2016-07-18 DIAGNOSIS — E876 Hypokalemia: Secondary | ICD-10-CM | POA: Diagnosis not present

## 2016-07-19 DIAGNOSIS — T86810 Lung transplant rejection: Secondary | ICD-10-CM | POA: Diagnosis not present

## 2016-07-19 DIAGNOSIS — Z79899 Other long term (current) drug therapy: Secondary | ICD-10-CM | POA: Diagnosis not present

## 2016-07-20 DIAGNOSIS — N2581 Secondary hyperparathyroidism of renal origin: Secondary | ICD-10-CM | POA: Diagnosis not present

## 2016-07-20 DIAGNOSIS — E876 Hypokalemia: Secondary | ICD-10-CM | POA: Diagnosis not present

## 2016-07-20 DIAGNOSIS — T8579XA Infection and inflammatory reaction due to other internal prosthetic devices, implants and grafts, initial encounter: Secondary | ICD-10-CM | POA: Diagnosis not present

## 2016-07-20 DIAGNOSIS — D631 Anemia in chronic kidney disease: Secondary | ICD-10-CM | POA: Diagnosis not present

## 2016-07-20 DIAGNOSIS — E1129 Type 2 diabetes mellitus with other diabetic kidney complication: Secondary | ICD-10-CM | POA: Diagnosis not present

## 2016-07-20 DIAGNOSIS — N186 End stage renal disease: Secondary | ICD-10-CM | POA: Diagnosis not present

## 2016-07-22 DIAGNOSIS — N2581 Secondary hyperparathyroidism of renal origin: Secondary | ICD-10-CM | POA: Diagnosis not present

## 2016-07-22 DIAGNOSIS — T8579XA Infection and inflammatory reaction due to other internal prosthetic devices, implants and grafts, initial encounter: Secondary | ICD-10-CM | POA: Diagnosis not present

## 2016-07-22 DIAGNOSIS — E876 Hypokalemia: Secondary | ICD-10-CM | POA: Diagnosis not present

## 2016-07-22 DIAGNOSIS — E1129 Type 2 diabetes mellitus with other diabetic kidney complication: Secondary | ICD-10-CM | POA: Diagnosis not present

## 2016-07-22 DIAGNOSIS — D631 Anemia in chronic kidney disease: Secondary | ICD-10-CM | POA: Diagnosis not present

## 2016-07-22 DIAGNOSIS — N186 End stage renal disease: Secondary | ICD-10-CM | POA: Diagnosis not present

## 2016-07-25 DIAGNOSIS — N186 End stage renal disease: Secondary | ICD-10-CM | POA: Diagnosis not present

## 2016-07-25 DIAGNOSIS — D631 Anemia in chronic kidney disease: Secondary | ICD-10-CM | POA: Diagnosis not present

## 2016-07-25 DIAGNOSIS — N2581 Secondary hyperparathyroidism of renal origin: Secondary | ICD-10-CM | POA: Diagnosis not present

## 2016-07-25 DIAGNOSIS — T8579XA Infection and inflammatory reaction due to other internal prosthetic devices, implants and grafts, initial encounter: Secondary | ICD-10-CM | POA: Diagnosis not present

## 2016-07-25 DIAGNOSIS — E1129 Type 2 diabetes mellitus with other diabetic kidney complication: Secondary | ICD-10-CM | POA: Diagnosis not present

## 2016-07-25 DIAGNOSIS — E876 Hypokalemia: Secondary | ICD-10-CM | POA: Diagnosis not present

## 2016-07-26 DIAGNOSIS — E1122 Type 2 diabetes mellitus with diabetic chronic kidney disease: Secondary | ICD-10-CM | POA: Diagnosis not present

## 2016-07-26 DIAGNOSIS — N186 End stage renal disease: Secondary | ICD-10-CM | POA: Diagnosis not present

## 2016-07-26 DIAGNOSIS — Z942 Lung transplant status: Secondary | ICD-10-CM | POA: Diagnosis not present

## 2016-07-26 DIAGNOSIS — I10 Essential (primary) hypertension: Secondary | ICD-10-CM | POA: Diagnosis not present

## 2016-07-26 DIAGNOSIS — I270 Primary pulmonary hypertension: Secondary | ICD-10-CM | POA: Diagnosis not present

## 2016-07-26 DIAGNOSIS — Z992 Dependence on renal dialysis: Secondary | ICD-10-CM | POA: Diagnosis not present

## 2016-07-26 DIAGNOSIS — Z794 Long term (current) use of insulin: Secondary | ICD-10-CM | POA: Diagnosis not present

## 2016-07-26 DIAGNOSIS — K219 Gastro-esophageal reflux disease without esophagitis: Secondary | ICD-10-CM | POA: Diagnosis not present

## 2016-07-26 DIAGNOSIS — J849 Interstitial pulmonary disease, unspecified: Secondary | ICD-10-CM | POA: Diagnosis not present

## 2016-07-27 DIAGNOSIS — T8579XA Infection and inflammatory reaction due to other internal prosthetic devices, implants and grafts, initial encounter: Secondary | ICD-10-CM | POA: Diagnosis not present

## 2016-07-27 DIAGNOSIS — N2581 Secondary hyperparathyroidism of renal origin: Secondary | ICD-10-CM | POA: Diagnosis not present

## 2016-07-27 DIAGNOSIS — N186 End stage renal disease: Secondary | ICD-10-CM | POA: Diagnosis not present

## 2016-07-27 DIAGNOSIS — E1129 Type 2 diabetes mellitus with other diabetic kidney complication: Secondary | ICD-10-CM | POA: Diagnosis not present

## 2016-07-27 DIAGNOSIS — E876 Hypokalemia: Secondary | ICD-10-CM | POA: Diagnosis not present

## 2016-07-27 DIAGNOSIS — D631 Anemia in chronic kidney disease: Secondary | ICD-10-CM | POA: Diagnosis not present

## 2016-07-29 DIAGNOSIS — N186 End stage renal disease: Secondary | ICD-10-CM | POA: Diagnosis not present

## 2016-07-29 DIAGNOSIS — N2581 Secondary hyperparathyroidism of renal origin: Secondary | ICD-10-CM | POA: Diagnosis not present

## 2016-07-29 DIAGNOSIS — T8579XA Infection and inflammatory reaction due to other internal prosthetic devices, implants and grafts, initial encounter: Secondary | ICD-10-CM | POA: Diagnosis not present

## 2016-07-29 DIAGNOSIS — D631 Anemia in chronic kidney disease: Secondary | ICD-10-CM | POA: Diagnosis not present

## 2016-07-29 DIAGNOSIS — E1129 Type 2 diabetes mellitus with other diabetic kidney complication: Secondary | ICD-10-CM | POA: Diagnosis not present

## 2016-07-29 DIAGNOSIS — E876 Hypokalemia: Secondary | ICD-10-CM | POA: Diagnosis not present

## 2016-08-01 DIAGNOSIS — D631 Anemia in chronic kidney disease: Secondary | ICD-10-CM | POA: Diagnosis not present

## 2016-08-01 DIAGNOSIS — E876 Hypokalemia: Secondary | ICD-10-CM | POA: Diagnosis not present

## 2016-08-01 DIAGNOSIS — N186 End stage renal disease: Secondary | ICD-10-CM | POA: Diagnosis not present

## 2016-08-01 DIAGNOSIS — T8579XA Infection and inflammatory reaction due to other internal prosthetic devices, implants and grafts, initial encounter: Secondary | ICD-10-CM | POA: Diagnosis not present

## 2016-08-01 DIAGNOSIS — E1129 Type 2 diabetes mellitus with other diabetic kidney complication: Secondary | ICD-10-CM | POA: Diagnosis not present

## 2016-08-01 DIAGNOSIS — N2581 Secondary hyperparathyroidism of renal origin: Secondary | ICD-10-CM | POA: Diagnosis not present

## 2016-08-02 DIAGNOSIS — N186 End stage renal disease: Secondary | ICD-10-CM | POA: Diagnosis not present

## 2016-08-02 DIAGNOSIS — Z992 Dependence on renal dialysis: Secondary | ICD-10-CM | POA: Diagnosis not present

## 2016-08-02 DIAGNOSIS — I871 Compression of vein: Secondary | ICD-10-CM | POA: Diagnosis not present

## 2016-08-03 DIAGNOSIS — N2581 Secondary hyperparathyroidism of renal origin: Secondary | ICD-10-CM | POA: Diagnosis not present

## 2016-08-03 DIAGNOSIS — N186 End stage renal disease: Secondary | ICD-10-CM | POA: Diagnosis not present

## 2016-08-03 DIAGNOSIS — D631 Anemia in chronic kidney disease: Secondary | ICD-10-CM | POA: Diagnosis not present

## 2016-08-03 DIAGNOSIS — I871 Compression of vein: Secondary | ICD-10-CM | POA: Diagnosis not present

## 2016-08-03 DIAGNOSIS — E876 Hypokalemia: Secondary | ICD-10-CM | POA: Diagnosis not present

## 2016-08-03 DIAGNOSIS — Z992 Dependence on renal dialysis: Secondary | ICD-10-CM | POA: Diagnosis not present

## 2016-08-03 DIAGNOSIS — E1129 Type 2 diabetes mellitus with other diabetic kidney complication: Secondary | ICD-10-CM | POA: Diagnosis not present

## 2016-08-03 DIAGNOSIS — T8579XA Infection and inflammatory reaction due to other internal prosthetic devices, implants and grafts, initial encounter: Secondary | ICD-10-CM | POA: Diagnosis not present

## 2016-08-04 DIAGNOSIS — Z418 Encounter for other procedures for purposes other than remedying health state: Secondary | ICD-10-CM | POA: Diagnosis not present

## 2016-08-04 DIAGNOSIS — D899 Disorder involving the immune mechanism, unspecified: Secondary | ICD-10-CM | POA: Diagnosis not present

## 2016-08-04 DIAGNOSIS — N189 Chronic kidney disease, unspecified: Secondary | ICD-10-CM | POA: Diagnosis not present

## 2016-08-04 DIAGNOSIS — Z942 Lung transplant status: Secondary | ICD-10-CM | POA: Diagnosis not present

## 2016-08-04 DIAGNOSIS — T86819 Unspecified complication of lung transplant: Secondary | ICD-10-CM | POA: Diagnosis not present

## 2016-08-04 DIAGNOSIS — N186 End stage renal disease: Secondary | ICD-10-CM | POA: Diagnosis not present

## 2016-08-04 DIAGNOSIS — Z79899 Other long term (current) drug therapy: Secondary | ICD-10-CM | POA: Diagnosis not present

## 2016-08-05 DIAGNOSIS — E876 Hypokalemia: Secondary | ICD-10-CM | POA: Diagnosis not present

## 2016-08-05 DIAGNOSIS — N186 End stage renal disease: Secondary | ICD-10-CM | POA: Diagnosis not present

## 2016-08-05 DIAGNOSIS — T8579XA Infection and inflammatory reaction due to other internal prosthetic devices, implants and grafts, initial encounter: Secondary | ICD-10-CM | POA: Diagnosis not present

## 2016-08-05 DIAGNOSIS — N2581 Secondary hyperparathyroidism of renal origin: Secondary | ICD-10-CM | POA: Diagnosis not present

## 2016-08-05 DIAGNOSIS — E1129 Type 2 diabetes mellitus with other diabetic kidney complication: Secondary | ICD-10-CM | POA: Diagnosis not present

## 2016-08-08 DIAGNOSIS — E1129 Type 2 diabetes mellitus with other diabetic kidney complication: Secondary | ICD-10-CM | POA: Diagnosis not present

## 2016-08-08 DIAGNOSIS — N2581 Secondary hyperparathyroidism of renal origin: Secondary | ICD-10-CM | POA: Diagnosis not present

## 2016-08-08 DIAGNOSIS — T8579XA Infection and inflammatory reaction due to other internal prosthetic devices, implants and grafts, initial encounter: Secondary | ICD-10-CM | POA: Diagnosis not present

## 2016-08-08 DIAGNOSIS — E876 Hypokalemia: Secondary | ICD-10-CM | POA: Diagnosis not present

## 2016-08-08 DIAGNOSIS — N186 End stage renal disease: Secondary | ICD-10-CM | POA: Diagnosis not present

## 2016-08-10 DIAGNOSIS — E876 Hypokalemia: Secondary | ICD-10-CM | POA: Diagnosis not present

## 2016-08-10 DIAGNOSIS — E1129 Type 2 diabetes mellitus with other diabetic kidney complication: Secondary | ICD-10-CM | POA: Diagnosis not present

## 2016-08-10 DIAGNOSIS — T8579XA Infection and inflammatory reaction due to other internal prosthetic devices, implants and grafts, initial encounter: Secondary | ICD-10-CM | POA: Diagnosis not present

## 2016-08-10 DIAGNOSIS — N2581 Secondary hyperparathyroidism of renal origin: Secondary | ICD-10-CM | POA: Diagnosis not present

## 2016-08-10 DIAGNOSIS — N186 End stage renal disease: Secondary | ICD-10-CM | POA: Diagnosis not present

## 2016-08-12 DIAGNOSIS — T8579XA Infection and inflammatory reaction due to other internal prosthetic devices, implants and grafts, initial encounter: Secondary | ICD-10-CM | POA: Diagnosis not present

## 2016-08-12 DIAGNOSIS — E876 Hypokalemia: Secondary | ICD-10-CM | POA: Diagnosis not present

## 2016-08-12 DIAGNOSIS — N186 End stage renal disease: Secondary | ICD-10-CM | POA: Diagnosis not present

## 2016-08-12 DIAGNOSIS — E1129 Type 2 diabetes mellitus with other diabetic kidney complication: Secondary | ICD-10-CM | POA: Diagnosis not present

## 2016-08-12 DIAGNOSIS — N2581 Secondary hyperparathyroidism of renal origin: Secondary | ICD-10-CM | POA: Diagnosis not present

## 2016-08-15 DIAGNOSIS — N2581 Secondary hyperparathyroidism of renal origin: Secondary | ICD-10-CM | POA: Diagnosis not present

## 2016-08-15 DIAGNOSIS — N186 End stage renal disease: Secondary | ICD-10-CM | POA: Diagnosis not present

## 2016-08-15 DIAGNOSIS — T8579XA Infection and inflammatory reaction due to other internal prosthetic devices, implants and grafts, initial encounter: Secondary | ICD-10-CM | POA: Diagnosis not present

## 2016-08-15 DIAGNOSIS — E876 Hypokalemia: Secondary | ICD-10-CM | POA: Diagnosis not present

## 2016-08-15 DIAGNOSIS — E1129 Type 2 diabetes mellitus with other diabetic kidney complication: Secondary | ICD-10-CM | POA: Diagnosis not present

## 2016-08-16 DIAGNOSIS — Z4824 Encounter for aftercare following lung transplant: Secondary | ICD-10-CM | POA: Diagnosis not present

## 2016-08-16 DIAGNOSIS — T86810 Lung transplant rejection: Secondary | ICD-10-CM | POA: Diagnosis not present

## 2016-08-17 DIAGNOSIS — E1129 Type 2 diabetes mellitus with other diabetic kidney complication: Secondary | ICD-10-CM | POA: Diagnosis not present

## 2016-08-17 DIAGNOSIS — T8579XA Infection and inflammatory reaction due to other internal prosthetic devices, implants and grafts, initial encounter: Secondary | ICD-10-CM | POA: Diagnosis not present

## 2016-08-17 DIAGNOSIS — N186 End stage renal disease: Secondary | ICD-10-CM | POA: Diagnosis not present

## 2016-08-17 DIAGNOSIS — E876 Hypokalemia: Secondary | ICD-10-CM | POA: Diagnosis not present

## 2016-08-17 DIAGNOSIS — N2581 Secondary hyperparathyroidism of renal origin: Secondary | ICD-10-CM | POA: Diagnosis not present

## 2016-08-19 DIAGNOSIS — T8579XA Infection and inflammatory reaction due to other internal prosthetic devices, implants and grafts, initial encounter: Secondary | ICD-10-CM | POA: Diagnosis not present

## 2016-08-19 DIAGNOSIS — E1129 Type 2 diabetes mellitus with other diabetic kidney complication: Secondary | ICD-10-CM | POA: Diagnosis not present

## 2016-08-19 DIAGNOSIS — N2581 Secondary hyperparathyroidism of renal origin: Secondary | ICD-10-CM | POA: Diagnosis not present

## 2016-08-19 DIAGNOSIS — N186 End stage renal disease: Secondary | ICD-10-CM | POA: Diagnosis not present

## 2016-08-19 DIAGNOSIS — E876 Hypokalemia: Secondary | ICD-10-CM | POA: Diagnosis not present

## 2016-08-22 DIAGNOSIS — N2581 Secondary hyperparathyroidism of renal origin: Secondary | ICD-10-CM | POA: Diagnosis not present

## 2016-08-22 DIAGNOSIS — E876 Hypokalemia: Secondary | ICD-10-CM | POA: Diagnosis not present

## 2016-08-22 DIAGNOSIS — E1129 Type 2 diabetes mellitus with other diabetic kidney complication: Secondary | ICD-10-CM | POA: Diagnosis not present

## 2016-08-22 DIAGNOSIS — N186 End stage renal disease: Secondary | ICD-10-CM | POA: Diagnosis not present

## 2016-08-22 DIAGNOSIS — T8579XA Infection and inflammatory reaction due to other internal prosthetic devices, implants and grafts, initial encounter: Secondary | ICD-10-CM | POA: Diagnosis not present

## 2016-08-24 DIAGNOSIS — N186 End stage renal disease: Secondary | ICD-10-CM | POA: Diagnosis not present

## 2016-08-24 DIAGNOSIS — T8579XA Infection and inflammatory reaction due to other internal prosthetic devices, implants and grafts, initial encounter: Secondary | ICD-10-CM | POA: Diagnosis not present

## 2016-08-24 DIAGNOSIS — E876 Hypokalemia: Secondary | ICD-10-CM | POA: Diagnosis not present

## 2016-08-24 DIAGNOSIS — N2581 Secondary hyperparathyroidism of renal origin: Secondary | ICD-10-CM | POA: Diagnosis not present

## 2016-08-24 DIAGNOSIS — E1129 Type 2 diabetes mellitus with other diabetic kidney complication: Secondary | ICD-10-CM | POA: Diagnosis not present

## 2016-08-26 DIAGNOSIS — N186 End stage renal disease: Secondary | ICD-10-CM | POA: Diagnosis not present

## 2016-08-26 DIAGNOSIS — N2581 Secondary hyperparathyroidism of renal origin: Secondary | ICD-10-CM | POA: Diagnosis not present

## 2016-08-26 DIAGNOSIS — E876 Hypokalemia: Secondary | ICD-10-CM | POA: Diagnosis not present

## 2016-08-26 DIAGNOSIS — T8579XA Infection and inflammatory reaction due to other internal prosthetic devices, implants and grafts, initial encounter: Secondary | ICD-10-CM | POA: Diagnosis not present

## 2016-08-26 DIAGNOSIS — E1129 Type 2 diabetes mellitus with other diabetic kidney complication: Secondary | ICD-10-CM | POA: Diagnosis not present

## 2016-08-29 DIAGNOSIS — E1129 Type 2 diabetes mellitus with other diabetic kidney complication: Secondary | ICD-10-CM | POA: Diagnosis not present

## 2016-08-29 DIAGNOSIS — N186 End stage renal disease: Secondary | ICD-10-CM | POA: Diagnosis not present

## 2016-08-29 DIAGNOSIS — E876 Hypokalemia: Secondary | ICD-10-CM | POA: Diagnosis not present

## 2016-08-29 DIAGNOSIS — T8579XA Infection and inflammatory reaction due to other internal prosthetic devices, implants and grafts, initial encounter: Secondary | ICD-10-CM | POA: Diagnosis not present

## 2016-08-29 DIAGNOSIS — N2581 Secondary hyperparathyroidism of renal origin: Secondary | ICD-10-CM | POA: Diagnosis not present

## 2016-08-30 DIAGNOSIS — N186 End stage renal disease: Secondary | ICD-10-CM | POA: Diagnosis not present

## 2016-08-30 DIAGNOSIS — N2581 Secondary hyperparathyroidism of renal origin: Secondary | ICD-10-CM | POA: Diagnosis not present

## 2016-08-30 DIAGNOSIS — E8770 Fluid overload, unspecified: Secondary | ICD-10-CM | POA: Diagnosis not present

## 2016-08-31 DIAGNOSIS — N2581 Secondary hyperparathyroidism of renal origin: Secondary | ICD-10-CM | POA: Diagnosis not present

## 2016-08-31 DIAGNOSIS — N186 End stage renal disease: Secondary | ICD-10-CM | POA: Diagnosis not present

## 2016-08-31 DIAGNOSIS — T8579XA Infection and inflammatory reaction due to other internal prosthetic devices, implants and grafts, initial encounter: Secondary | ICD-10-CM | POA: Diagnosis not present

## 2016-08-31 DIAGNOSIS — E1129 Type 2 diabetes mellitus with other diabetic kidney complication: Secondary | ICD-10-CM | POA: Diagnosis not present

## 2016-08-31 DIAGNOSIS — E876 Hypokalemia: Secondary | ICD-10-CM | POA: Diagnosis not present

## 2016-09-01 DIAGNOSIS — Z418 Encounter for other procedures for purposes other than remedying health state: Secondary | ICD-10-CM | POA: Diagnosis not present

## 2016-09-01 DIAGNOSIS — N186 End stage renal disease: Secondary | ICD-10-CM | POA: Diagnosis not present

## 2016-09-01 DIAGNOSIS — Z942 Lung transplant status: Secondary | ICD-10-CM | POA: Diagnosis not present

## 2016-09-01 DIAGNOSIS — T86819 Unspecified complication of lung transplant: Secondary | ICD-10-CM | POA: Diagnosis not present

## 2016-09-01 DIAGNOSIS — D899 Disorder involving the immune mechanism, unspecified: Secondary | ICD-10-CM | POA: Diagnosis not present

## 2016-09-01 DIAGNOSIS — N189 Chronic kidney disease, unspecified: Secondary | ICD-10-CM | POA: Diagnosis not present

## 2016-09-01 DIAGNOSIS — Z79899 Other long term (current) drug therapy: Secondary | ICD-10-CM | POA: Diagnosis not present

## 2016-09-02 DIAGNOSIS — N186 End stage renal disease: Secondary | ICD-10-CM | POA: Diagnosis not present

## 2016-09-02 DIAGNOSIS — E876 Hypokalemia: Secondary | ICD-10-CM | POA: Diagnosis not present

## 2016-09-02 DIAGNOSIS — N2581 Secondary hyperparathyroidism of renal origin: Secondary | ICD-10-CM | POA: Diagnosis not present

## 2016-09-02 DIAGNOSIS — T8579XA Infection and inflammatory reaction due to other internal prosthetic devices, implants and grafts, initial encounter: Secondary | ICD-10-CM | POA: Diagnosis not present

## 2016-09-02 DIAGNOSIS — E1129 Type 2 diabetes mellitus with other diabetic kidney complication: Secondary | ICD-10-CM | POA: Diagnosis not present

## 2016-09-03 DIAGNOSIS — N186 End stage renal disease: Secondary | ICD-10-CM | POA: Diagnosis not present

## 2016-09-03 DIAGNOSIS — T86819 Unspecified complication of lung transplant: Secondary | ICD-10-CM | POA: Diagnosis not present

## 2016-09-03 DIAGNOSIS — Z992 Dependence on renal dialysis: Secondary | ICD-10-CM | POA: Diagnosis not present

## 2016-09-05 DIAGNOSIS — N186 End stage renal disease: Secondary | ICD-10-CM | POA: Diagnosis not present

## 2016-09-05 DIAGNOSIS — E1129 Type 2 diabetes mellitus with other diabetic kidney complication: Secondary | ICD-10-CM | POA: Diagnosis not present

## 2016-09-05 DIAGNOSIS — T8579XA Infection and inflammatory reaction due to other internal prosthetic devices, implants and grafts, initial encounter: Secondary | ICD-10-CM | POA: Diagnosis not present

## 2016-09-05 DIAGNOSIS — N2581 Secondary hyperparathyroidism of renal origin: Secondary | ICD-10-CM | POA: Diagnosis not present

## 2016-09-05 DIAGNOSIS — E876 Hypokalemia: Secondary | ICD-10-CM | POA: Diagnosis not present

## 2016-09-07 DIAGNOSIS — E876 Hypokalemia: Secondary | ICD-10-CM | POA: Diagnosis not present

## 2016-09-07 DIAGNOSIS — T8579XA Infection and inflammatory reaction due to other internal prosthetic devices, implants and grafts, initial encounter: Secondary | ICD-10-CM | POA: Diagnosis not present

## 2016-09-07 DIAGNOSIS — E1129 Type 2 diabetes mellitus with other diabetic kidney complication: Secondary | ICD-10-CM | POA: Diagnosis not present

## 2016-09-07 DIAGNOSIS — N186 End stage renal disease: Secondary | ICD-10-CM | POA: Diagnosis not present

## 2016-09-07 DIAGNOSIS — N2581 Secondary hyperparathyroidism of renal origin: Secondary | ICD-10-CM | POA: Diagnosis not present

## 2016-09-08 DIAGNOSIS — Z794 Long term (current) use of insulin: Secondary | ICD-10-CM | POA: Diagnosis not present

## 2016-09-08 DIAGNOSIS — E0822 Diabetes mellitus due to underlying condition with diabetic chronic kidney disease: Secondary | ICD-10-CM | POA: Diagnosis not present

## 2016-09-09 DIAGNOSIS — E1129 Type 2 diabetes mellitus with other diabetic kidney complication: Secondary | ICD-10-CM | POA: Diagnosis not present

## 2016-09-09 DIAGNOSIS — N186 End stage renal disease: Secondary | ICD-10-CM | POA: Diagnosis not present

## 2016-09-09 DIAGNOSIS — N2581 Secondary hyperparathyroidism of renal origin: Secondary | ICD-10-CM | POA: Diagnosis not present

## 2016-09-09 DIAGNOSIS — T8579XA Infection and inflammatory reaction due to other internal prosthetic devices, implants and grafts, initial encounter: Secondary | ICD-10-CM | POA: Diagnosis not present

## 2016-09-09 DIAGNOSIS — E876 Hypokalemia: Secondary | ICD-10-CM | POA: Diagnosis not present

## 2016-09-12 DIAGNOSIS — N2581 Secondary hyperparathyroidism of renal origin: Secondary | ICD-10-CM | POA: Diagnosis not present

## 2016-09-12 DIAGNOSIS — N186 End stage renal disease: Secondary | ICD-10-CM | POA: Diagnosis not present

## 2016-09-14 DIAGNOSIS — N2581 Secondary hyperparathyroidism of renal origin: Secondary | ICD-10-CM | POA: Diagnosis not present

## 2016-09-14 DIAGNOSIS — N186 End stage renal disease: Secondary | ICD-10-CM | POA: Diagnosis not present

## 2016-09-16 DIAGNOSIS — N2581 Secondary hyperparathyroidism of renal origin: Secondary | ICD-10-CM | POA: Diagnosis not present

## 2016-09-16 DIAGNOSIS — N186 End stage renal disease: Secondary | ICD-10-CM | POA: Diagnosis not present

## 2016-09-19 DIAGNOSIS — T8579XA Infection and inflammatory reaction due to other internal prosthetic devices, implants and grafts, initial encounter: Secondary | ICD-10-CM | POA: Diagnosis not present

## 2016-09-19 DIAGNOSIS — E1129 Type 2 diabetes mellitus with other diabetic kidney complication: Secondary | ICD-10-CM | POA: Diagnosis not present

## 2016-09-19 DIAGNOSIS — N186 End stage renal disease: Secondary | ICD-10-CM | POA: Diagnosis not present

## 2016-09-19 DIAGNOSIS — N2581 Secondary hyperparathyroidism of renal origin: Secondary | ICD-10-CM | POA: Diagnosis not present

## 2016-09-19 DIAGNOSIS — E876 Hypokalemia: Secondary | ICD-10-CM | POA: Diagnosis not present

## 2016-09-21 DIAGNOSIS — T8579XA Infection and inflammatory reaction due to other internal prosthetic devices, implants and grafts, initial encounter: Secondary | ICD-10-CM | POA: Diagnosis not present

## 2016-09-21 DIAGNOSIS — N2581 Secondary hyperparathyroidism of renal origin: Secondary | ICD-10-CM | POA: Diagnosis not present

## 2016-09-21 DIAGNOSIS — E876 Hypokalemia: Secondary | ICD-10-CM | POA: Diagnosis not present

## 2016-09-21 DIAGNOSIS — E1129 Type 2 diabetes mellitus with other diabetic kidney complication: Secondary | ICD-10-CM | POA: Diagnosis not present

## 2016-09-21 DIAGNOSIS — N186 End stage renal disease: Secondary | ICD-10-CM | POA: Diagnosis not present

## 2016-09-23 DIAGNOSIS — N186 End stage renal disease: Secondary | ICD-10-CM | POA: Diagnosis not present

## 2016-09-23 DIAGNOSIS — E1129 Type 2 diabetes mellitus with other diabetic kidney complication: Secondary | ICD-10-CM | POA: Diagnosis not present

## 2016-09-23 DIAGNOSIS — T8579XA Infection and inflammatory reaction due to other internal prosthetic devices, implants and grafts, initial encounter: Secondary | ICD-10-CM | POA: Diagnosis not present

## 2016-09-23 DIAGNOSIS — E876 Hypokalemia: Secondary | ICD-10-CM | POA: Diagnosis not present

## 2016-09-23 DIAGNOSIS — N2581 Secondary hyperparathyroidism of renal origin: Secondary | ICD-10-CM | POA: Diagnosis not present

## 2016-09-26 DIAGNOSIS — E876 Hypokalemia: Secondary | ICD-10-CM | POA: Diagnosis not present

## 2016-09-26 DIAGNOSIS — N186 End stage renal disease: Secondary | ICD-10-CM | POA: Diagnosis not present

## 2016-09-26 DIAGNOSIS — E1129 Type 2 diabetes mellitus with other diabetic kidney complication: Secondary | ICD-10-CM | POA: Diagnosis not present

## 2016-09-26 DIAGNOSIS — T8579XA Infection and inflammatory reaction due to other internal prosthetic devices, implants and grafts, initial encounter: Secondary | ICD-10-CM | POA: Diagnosis not present

## 2016-09-26 DIAGNOSIS — N2581 Secondary hyperparathyroidism of renal origin: Secondary | ICD-10-CM | POA: Diagnosis not present

## 2016-09-27 DIAGNOSIS — N185 Chronic kidney disease, stage 5: Secondary | ICD-10-CM | POA: Diagnosis not present

## 2016-09-27 DIAGNOSIS — G4700 Insomnia, unspecified: Secondary | ICD-10-CM | POA: Diagnosis not present

## 2016-09-27 DIAGNOSIS — R142 Eructation: Secondary | ICD-10-CM | POA: Diagnosis not present

## 2016-09-27 DIAGNOSIS — Z79899 Other long term (current) drug therapy: Secondary | ICD-10-CM | POA: Diagnosis not present

## 2016-09-27 DIAGNOSIS — D899 Disorder involving the immune mechanism, unspecified: Secondary | ICD-10-CM | POA: Diagnosis not present

## 2016-09-27 DIAGNOSIS — Z8711 Personal history of peptic ulcer disease: Secondary | ICD-10-CM | POA: Diagnosis not present

## 2016-09-27 DIAGNOSIS — T86810 Lung transplant rejection: Secondary | ICD-10-CM | POA: Diagnosis not present

## 2016-09-27 DIAGNOSIS — T82868A Thrombosis of vascular prosthetic devices, implants and grafts, initial encounter: Secondary | ICD-10-CM | POA: Diagnosis not present

## 2016-09-27 DIAGNOSIS — R140 Abdominal distension (gaseous): Secondary | ICD-10-CM | POA: Diagnosis not present

## 2016-09-27 DIAGNOSIS — J8410 Pulmonary fibrosis, unspecified: Secondary | ICD-10-CM | POA: Diagnosis not present

## 2016-09-27 DIAGNOSIS — R6881 Early satiety: Secondary | ICD-10-CM | POA: Diagnosis not present

## 2016-09-27 DIAGNOSIS — Z942 Lung transplant status: Secondary | ICD-10-CM | POA: Diagnosis not present

## 2016-09-27 DIAGNOSIS — Z992 Dependence on renal dialysis: Secondary | ICD-10-CM | POA: Diagnosis not present

## 2016-09-27 DIAGNOSIS — R251 Tremor, unspecified: Secondary | ICD-10-CM | POA: Diagnosis not present

## 2016-09-27 DIAGNOSIS — N186 End stage renal disease: Secondary | ICD-10-CM | POA: Diagnosis not present

## 2016-09-27 DIAGNOSIS — R942 Abnormal results of pulmonary function studies: Secondary | ICD-10-CM | POA: Diagnosis not present

## 2016-09-27 DIAGNOSIS — I953 Hypotension of hemodialysis: Secondary | ICD-10-CM | POA: Diagnosis not present

## 2016-09-28 DIAGNOSIS — N186 End stage renal disease: Secondary | ICD-10-CM | POA: Diagnosis not present

## 2016-09-28 DIAGNOSIS — N2581 Secondary hyperparathyroidism of renal origin: Secondary | ICD-10-CM | POA: Diagnosis not present

## 2016-09-28 DIAGNOSIS — E876 Hypokalemia: Secondary | ICD-10-CM | POA: Diagnosis not present

## 2016-09-28 DIAGNOSIS — E1129 Type 2 diabetes mellitus with other diabetic kidney complication: Secondary | ICD-10-CM | POA: Diagnosis not present

## 2016-09-28 DIAGNOSIS — T8579XA Infection and inflammatory reaction due to other internal prosthetic devices, implants and grafts, initial encounter: Secondary | ICD-10-CM | POA: Diagnosis not present

## 2016-09-29 DIAGNOSIS — N186 End stage renal disease: Secondary | ICD-10-CM | POA: Diagnosis not present

## 2016-09-29 DIAGNOSIS — Z992 Dependence on renal dialysis: Secondary | ICD-10-CM | POA: Diagnosis not present

## 2016-09-29 DIAGNOSIS — T82858A Stenosis of vascular prosthetic devices, implants and grafts, initial encounter: Secondary | ICD-10-CM | POA: Diagnosis not present

## 2016-09-29 DIAGNOSIS — I871 Compression of vein: Secondary | ICD-10-CM | POA: Diagnosis not present

## 2016-09-30 DIAGNOSIS — N2581 Secondary hyperparathyroidism of renal origin: Secondary | ICD-10-CM | POA: Diagnosis not present

## 2016-09-30 DIAGNOSIS — T8579XA Infection and inflammatory reaction due to other internal prosthetic devices, implants and grafts, initial encounter: Secondary | ICD-10-CM | POA: Diagnosis not present

## 2016-09-30 DIAGNOSIS — N186 End stage renal disease: Secondary | ICD-10-CM | POA: Diagnosis not present

## 2016-09-30 DIAGNOSIS — E1129 Type 2 diabetes mellitus with other diabetic kidney complication: Secondary | ICD-10-CM | POA: Diagnosis not present

## 2016-09-30 DIAGNOSIS — E876 Hypokalemia: Secondary | ICD-10-CM | POA: Diagnosis not present

## 2016-10-03 DIAGNOSIS — T8579XA Infection and inflammatory reaction due to other internal prosthetic devices, implants and grafts, initial encounter: Secondary | ICD-10-CM | POA: Diagnosis not present

## 2016-10-03 DIAGNOSIS — N2581 Secondary hyperparathyroidism of renal origin: Secondary | ICD-10-CM | POA: Diagnosis not present

## 2016-10-03 DIAGNOSIS — Z992 Dependence on renal dialysis: Secondary | ICD-10-CM | POA: Diagnosis not present

## 2016-10-03 DIAGNOSIS — T86819 Unspecified complication of lung transplant: Secondary | ICD-10-CM | POA: Diagnosis not present

## 2016-10-03 DIAGNOSIS — E876 Hypokalemia: Secondary | ICD-10-CM | POA: Diagnosis not present

## 2016-10-03 DIAGNOSIS — N186 End stage renal disease: Secondary | ICD-10-CM | POA: Diagnosis not present

## 2016-10-03 DIAGNOSIS — E1129 Type 2 diabetes mellitus with other diabetic kidney complication: Secondary | ICD-10-CM | POA: Diagnosis not present

## 2016-10-05 DIAGNOSIS — D631 Anemia in chronic kidney disease: Secondary | ICD-10-CM | POA: Diagnosis not present

## 2016-10-05 DIAGNOSIS — N2581 Secondary hyperparathyroidism of renal origin: Secondary | ICD-10-CM | POA: Diagnosis not present

## 2016-10-05 DIAGNOSIS — N186 End stage renal disease: Secondary | ICD-10-CM | POA: Diagnosis not present

## 2016-10-05 DIAGNOSIS — T8579XA Infection and inflammatory reaction due to other internal prosthetic devices, implants and grafts, initial encounter: Secondary | ICD-10-CM | POA: Diagnosis not present

## 2016-10-05 DIAGNOSIS — E876 Hypokalemia: Secondary | ICD-10-CM | POA: Diagnosis not present

## 2016-10-07 DIAGNOSIS — N2581 Secondary hyperparathyroidism of renal origin: Secondary | ICD-10-CM | POA: Diagnosis not present

## 2016-10-07 DIAGNOSIS — E876 Hypokalemia: Secondary | ICD-10-CM | POA: Diagnosis not present

## 2016-10-07 DIAGNOSIS — T8579XA Infection and inflammatory reaction due to other internal prosthetic devices, implants and grafts, initial encounter: Secondary | ICD-10-CM | POA: Diagnosis not present

## 2016-10-07 DIAGNOSIS — N186 End stage renal disease: Secondary | ICD-10-CM | POA: Diagnosis not present

## 2016-10-07 DIAGNOSIS — D631 Anemia in chronic kidney disease: Secondary | ICD-10-CM | POA: Diagnosis not present

## 2016-10-10 DIAGNOSIS — D631 Anemia in chronic kidney disease: Secondary | ICD-10-CM | POA: Diagnosis not present

## 2016-10-10 DIAGNOSIS — N2581 Secondary hyperparathyroidism of renal origin: Secondary | ICD-10-CM | POA: Diagnosis not present

## 2016-10-10 DIAGNOSIS — T8579XA Infection and inflammatory reaction due to other internal prosthetic devices, implants and grafts, initial encounter: Secondary | ICD-10-CM | POA: Diagnosis not present

## 2016-10-10 DIAGNOSIS — N186 End stage renal disease: Secondary | ICD-10-CM | POA: Diagnosis not present

## 2016-10-10 DIAGNOSIS — E876 Hypokalemia: Secondary | ICD-10-CM | POA: Diagnosis not present

## 2016-10-11 DIAGNOSIS — H2512 Age-related nuclear cataract, left eye: Secondary | ICD-10-CM | POA: Diagnosis not present

## 2016-10-11 DIAGNOSIS — H25013 Cortical age-related cataract, bilateral: Secondary | ICD-10-CM | POA: Diagnosis not present

## 2016-10-11 DIAGNOSIS — H25042 Posterior subcapsular polar age-related cataract, left eye: Secondary | ICD-10-CM | POA: Diagnosis not present

## 2016-10-11 DIAGNOSIS — H4063X1 Glaucoma secondary to drugs, bilateral, mild stage: Secondary | ICD-10-CM | POA: Diagnosis not present

## 2016-10-11 DIAGNOSIS — H25043 Posterior subcapsular polar age-related cataract, bilateral: Secondary | ICD-10-CM | POA: Diagnosis not present

## 2016-10-11 DIAGNOSIS — H2513 Age-related nuclear cataract, bilateral: Secondary | ICD-10-CM | POA: Diagnosis not present

## 2016-10-11 DIAGNOSIS — H40043 Steroid responder, bilateral: Secondary | ICD-10-CM | POA: Diagnosis not present

## 2016-10-11 DIAGNOSIS — H40053 Ocular hypertension, bilateral: Secondary | ICD-10-CM | POA: Diagnosis not present

## 2016-10-12 DIAGNOSIS — E876 Hypokalemia: Secondary | ICD-10-CM | POA: Diagnosis not present

## 2016-10-12 DIAGNOSIS — T8579XA Infection and inflammatory reaction due to other internal prosthetic devices, implants and grafts, initial encounter: Secondary | ICD-10-CM | POA: Diagnosis not present

## 2016-10-12 DIAGNOSIS — N2581 Secondary hyperparathyroidism of renal origin: Secondary | ICD-10-CM | POA: Diagnosis not present

## 2016-10-12 DIAGNOSIS — D631 Anemia in chronic kidney disease: Secondary | ICD-10-CM | POA: Diagnosis not present

## 2016-10-12 DIAGNOSIS — N186 End stage renal disease: Secondary | ICD-10-CM | POA: Diagnosis not present

## 2016-10-13 DIAGNOSIS — Z942 Lung transplant status: Secondary | ICD-10-CM | POA: Diagnosis not present

## 2016-10-13 DIAGNOSIS — D899 Disorder involving the immune mechanism, unspecified: Secondary | ICD-10-CM | POA: Diagnosis not present

## 2016-10-13 DIAGNOSIS — T86819 Unspecified complication of lung transplant: Secondary | ICD-10-CM | POA: Diagnosis not present

## 2016-10-13 DIAGNOSIS — N189 Chronic kidney disease, unspecified: Secondary | ICD-10-CM | POA: Diagnosis not present

## 2016-10-13 DIAGNOSIS — Z79899 Other long term (current) drug therapy: Secondary | ICD-10-CM | POA: Diagnosis not present

## 2016-10-13 DIAGNOSIS — N186 End stage renal disease: Secondary | ICD-10-CM | POA: Diagnosis not present

## 2016-10-13 DIAGNOSIS — Z418 Encounter for other procedures for purposes other than remedying health state: Secondary | ICD-10-CM | POA: Diagnosis not present

## 2016-10-14 DIAGNOSIS — D631 Anemia in chronic kidney disease: Secondary | ICD-10-CM | POA: Diagnosis not present

## 2016-10-14 DIAGNOSIS — E876 Hypokalemia: Secondary | ICD-10-CM | POA: Diagnosis not present

## 2016-10-14 DIAGNOSIS — N2581 Secondary hyperparathyroidism of renal origin: Secondary | ICD-10-CM | POA: Diagnosis not present

## 2016-10-14 DIAGNOSIS — N186 End stage renal disease: Secondary | ICD-10-CM | POA: Diagnosis not present

## 2016-10-14 DIAGNOSIS — T8579XA Infection and inflammatory reaction due to other internal prosthetic devices, implants and grafts, initial encounter: Secondary | ICD-10-CM | POA: Diagnosis not present

## 2016-10-17 DIAGNOSIS — D631 Anemia in chronic kidney disease: Secondary | ICD-10-CM | POA: Diagnosis not present

## 2016-10-17 DIAGNOSIS — T8579XA Infection and inflammatory reaction due to other internal prosthetic devices, implants and grafts, initial encounter: Secondary | ICD-10-CM | POA: Diagnosis not present

## 2016-10-17 DIAGNOSIS — N186 End stage renal disease: Secondary | ICD-10-CM | POA: Diagnosis not present

## 2016-10-17 DIAGNOSIS — N2581 Secondary hyperparathyroidism of renal origin: Secondary | ICD-10-CM | POA: Diagnosis not present

## 2016-10-17 DIAGNOSIS — E876 Hypokalemia: Secondary | ICD-10-CM | POA: Diagnosis not present

## 2016-10-19 DIAGNOSIS — E876 Hypokalemia: Secondary | ICD-10-CM | POA: Diagnosis not present

## 2016-10-19 DIAGNOSIS — N2581 Secondary hyperparathyroidism of renal origin: Secondary | ICD-10-CM | POA: Diagnosis not present

## 2016-10-19 DIAGNOSIS — T8579XA Infection and inflammatory reaction due to other internal prosthetic devices, implants and grafts, initial encounter: Secondary | ICD-10-CM | POA: Diagnosis not present

## 2016-10-19 DIAGNOSIS — D631 Anemia in chronic kidney disease: Secondary | ICD-10-CM | POA: Diagnosis not present

## 2016-10-19 DIAGNOSIS — N186 End stage renal disease: Secondary | ICD-10-CM | POA: Diagnosis not present

## 2016-10-21 DIAGNOSIS — N186 End stage renal disease: Secondary | ICD-10-CM | POA: Diagnosis not present

## 2016-10-21 DIAGNOSIS — N2581 Secondary hyperparathyroidism of renal origin: Secondary | ICD-10-CM | POA: Diagnosis not present

## 2016-10-24 DIAGNOSIS — T8579XA Infection and inflammatory reaction due to other internal prosthetic devices, implants and grafts, initial encounter: Secondary | ICD-10-CM | POA: Diagnosis not present

## 2016-10-24 DIAGNOSIS — E876 Hypokalemia: Secondary | ICD-10-CM | POA: Diagnosis not present

## 2016-10-24 DIAGNOSIS — N186 End stage renal disease: Secondary | ICD-10-CM | POA: Diagnosis not present

## 2016-10-24 DIAGNOSIS — D631 Anemia in chronic kidney disease: Secondary | ICD-10-CM | POA: Diagnosis not present

## 2016-10-24 DIAGNOSIS — N2581 Secondary hyperparathyroidism of renal origin: Secondary | ICD-10-CM | POA: Diagnosis not present

## 2016-10-26 DIAGNOSIS — T8579XA Infection and inflammatory reaction due to other internal prosthetic devices, implants and grafts, initial encounter: Secondary | ICD-10-CM | POA: Diagnosis not present

## 2016-10-26 DIAGNOSIS — N186 End stage renal disease: Secondary | ICD-10-CM | POA: Diagnosis not present

## 2016-10-26 DIAGNOSIS — N2581 Secondary hyperparathyroidism of renal origin: Secondary | ICD-10-CM | POA: Diagnosis not present

## 2016-10-26 DIAGNOSIS — E876 Hypokalemia: Secondary | ICD-10-CM | POA: Diagnosis not present

## 2016-10-26 DIAGNOSIS — D631 Anemia in chronic kidney disease: Secondary | ICD-10-CM | POA: Diagnosis not present

## 2016-10-27 DIAGNOSIS — N186 End stage renal disease: Secondary | ICD-10-CM | POA: Diagnosis not present

## 2016-10-27 DIAGNOSIS — E8770 Fluid overload, unspecified: Secondary | ICD-10-CM | POA: Diagnosis not present

## 2016-10-27 DIAGNOSIS — N2581 Secondary hyperparathyroidism of renal origin: Secondary | ICD-10-CM | POA: Diagnosis not present

## 2016-10-28 DIAGNOSIS — D631 Anemia in chronic kidney disease: Secondary | ICD-10-CM | POA: Diagnosis not present

## 2016-10-28 DIAGNOSIS — N186 End stage renal disease: Secondary | ICD-10-CM | POA: Diagnosis not present

## 2016-10-28 DIAGNOSIS — N2581 Secondary hyperparathyroidism of renal origin: Secondary | ICD-10-CM | POA: Diagnosis not present

## 2016-10-28 DIAGNOSIS — E876 Hypokalemia: Secondary | ICD-10-CM | POA: Diagnosis not present

## 2016-10-28 DIAGNOSIS — T8579XA Infection and inflammatory reaction due to other internal prosthetic devices, implants and grafts, initial encounter: Secondary | ICD-10-CM | POA: Diagnosis not present

## 2016-10-31 DIAGNOSIS — E876 Hypokalemia: Secondary | ICD-10-CM | POA: Diagnosis not present

## 2016-10-31 DIAGNOSIS — N186 End stage renal disease: Secondary | ICD-10-CM | POA: Diagnosis not present

## 2016-10-31 DIAGNOSIS — D631 Anemia in chronic kidney disease: Secondary | ICD-10-CM | POA: Diagnosis not present

## 2016-10-31 DIAGNOSIS — N2581 Secondary hyperparathyroidism of renal origin: Secondary | ICD-10-CM | POA: Diagnosis not present

## 2016-10-31 DIAGNOSIS — T8579XA Infection and inflammatory reaction due to other internal prosthetic devices, implants and grafts, initial encounter: Secondary | ICD-10-CM | POA: Diagnosis not present

## 2016-11-02 DIAGNOSIS — E876 Hypokalemia: Secondary | ICD-10-CM | POA: Diagnosis not present

## 2016-11-02 DIAGNOSIS — D631 Anemia in chronic kidney disease: Secondary | ICD-10-CM | POA: Diagnosis not present

## 2016-11-02 DIAGNOSIS — N2581 Secondary hyperparathyroidism of renal origin: Secondary | ICD-10-CM | POA: Diagnosis not present

## 2016-11-02 DIAGNOSIS — N186 End stage renal disease: Secondary | ICD-10-CM | POA: Diagnosis not present

## 2016-11-02 DIAGNOSIS — T8579XA Infection and inflammatory reaction due to other internal prosthetic devices, implants and grafts, initial encounter: Secondary | ICD-10-CM | POA: Diagnosis not present

## 2016-11-03 DIAGNOSIS — D899 Disorder involving the immune mechanism, unspecified: Secondary | ICD-10-CM | POA: Diagnosis not present

## 2016-11-03 DIAGNOSIS — Z992 Dependence on renal dialysis: Secondary | ICD-10-CM | POA: Diagnosis not present

## 2016-11-03 DIAGNOSIS — N186 End stage renal disease: Secondary | ICD-10-CM | POA: Diagnosis not present

## 2016-11-03 DIAGNOSIS — N189 Chronic kidney disease, unspecified: Secondary | ICD-10-CM | POA: Diagnosis not present

## 2016-11-03 DIAGNOSIS — T86819 Unspecified complication of lung transplant: Secondary | ICD-10-CM | POA: Diagnosis not present

## 2016-11-03 DIAGNOSIS — Z418 Encounter for other procedures for purposes other than remedying health state: Secondary | ICD-10-CM | POA: Diagnosis not present

## 2016-11-03 DIAGNOSIS — Z79899 Other long term (current) drug therapy: Secondary | ICD-10-CM | POA: Diagnosis not present

## 2016-11-03 DIAGNOSIS — M79606 Pain in leg, unspecified: Secondary | ICD-10-CM | POA: Diagnosis not present

## 2016-11-03 DIAGNOSIS — Z942 Lung transplant status: Secondary | ICD-10-CM | POA: Diagnosis not present

## 2016-11-04 DIAGNOSIS — N2581 Secondary hyperparathyroidism of renal origin: Secondary | ICD-10-CM | POA: Diagnosis not present

## 2016-11-04 DIAGNOSIS — T8579XA Infection and inflammatory reaction due to other internal prosthetic devices, implants and grafts, initial encounter: Secondary | ICD-10-CM | POA: Diagnosis not present

## 2016-11-04 DIAGNOSIS — N186 End stage renal disease: Secondary | ICD-10-CM | POA: Diagnosis not present

## 2016-11-04 DIAGNOSIS — D631 Anemia in chronic kidney disease: Secondary | ICD-10-CM | POA: Diagnosis not present

## 2016-11-04 DIAGNOSIS — E1129 Type 2 diabetes mellitus with other diabetic kidney complication: Secondary | ICD-10-CM | POA: Diagnosis not present

## 2016-11-04 DIAGNOSIS — E876 Hypokalemia: Secondary | ICD-10-CM | POA: Diagnosis not present

## 2016-11-07 DIAGNOSIS — E876 Hypokalemia: Secondary | ICD-10-CM | POA: Diagnosis not present

## 2016-11-07 DIAGNOSIS — E1129 Type 2 diabetes mellitus with other diabetic kidney complication: Secondary | ICD-10-CM | POA: Diagnosis not present

## 2016-11-07 DIAGNOSIS — T8579XA Infection and inflammatory reaction due to other internal prosthetic devices, implants and grafts, initial encounter: Secondary | ICD-10-CM | POA: Diagnosis not present

## 2016-11-07 DIAGNOSIS — D631 Anemia in chronic kidney disease: Secondary | ICD-10-CM | POA: Diagnosis not present

## 2016-11-07 DIAGNOSIS — N2581 Secondary hyperparathyroidism of renal origin: Secondary | ICD-10-CM | POA: Diagnosis not present

## 2016-11-07 DIAGNOSIS — N186 End stage renal disease: Secondary | ICD-10-CM | POA: Diagnosis not present

## 2016-11-08 DIAGNOSIS — Z992 Dependence on renal dialysis: Secondary | ICD-10-CM | POA: Diagnosis not present

## 2016-11-08 DIAGNOSIS — I70213 Atherosclerosis of native arteries of extremities with intermittent claudication, bilateral legs: Secondary | ICD-10-CM | POA: Diagnosis not present

## 2016-11-08 DIAGNOSIS — I739 Peripheral vascular disease, unspecified: Secondary | ICD-10-CM | POA: Diagnosis not present

## 2016-11-08 DIAGNOSIS — N186 End stage renal disease: Secondary | ICD-10-CM | POA: Diagnosis not present

## 2016-11-08 DIAGNOSIS — Z942 Lung transplant status: Secondary | ICD-10-CM | POA: Diagnosis not present

## 2016-11-09 DIAGNOSIS — N2581 Secondary hyperparathyroidism of renal origin: Secondary | ICD-10-CM | POA: Diagnosis not present

## 2016-11-09 DIAGNOSIS — N186 End stage renal disease: Secondary | ICD-10-CM | POA: Diagnosis not present

## 2016-11-09 DIAGNOSIS — E1129 Type 2 diabetes mellitus with other diabetic kidney complication: Secondary | ICD-10-CM | POA: Diagnosis not present

## 2016-11-09 DIAGNOSIS — T8579XA Infection and inflammatory reaction due to other internal prosthetic devices, implants and grafts, initial encounter: Secondary | ICD-10-CM | POA: Diagnosis not present

## 2016-11-09 DIAGNOSIS — D631 Anemia in chronic kidney disease: Secondary | ICD-10-CM | POA: Diagnosis not present

## 2016-11-09 DIAGNOSIS — E876 Hypokalemia: Secondary | ICD-10-CM | POA: Diagnosis not present

## 2016-11-11 DIAGNOSIS — T8579XA Infection and inflammatory reaction due to other internal prosthetic devices, implants and grafts, initial encounter: Secondary | ICD-10-CM | POA: Diagnosis not present

## 2016-11-11 DIAGNOSIS — N186 End stage renal disease: Secondary | ICD-10-CM | POA: Diagnosis not present

## 2016-11-11 DIAGNOSIS — N2581 Secondary hyperparathyroidism of renal origin: Secondary | ICD-10-CM | POA: Diagnosis not present

## 2016-11-11 DIAGNOSIS — E876 Hypokalemia: Secondary | ICD-10-CM | POA: Diagnosis not present

## 2016-11-11 DIAGNOSIS — D631 Anemia in chronic kidney disease: Secondary | ICD-10-CM | POA: Diagnosis not present

## 2016-11-11 DIAGNOSIS — E1129 Type 2 diabetes mellitus with other diabetic kidney complication: Secondary | ICD-10-CM | POA: Diagnosis not present

## 2016-11-14 DIAGNOSIS — D631 Anemia in chronic kidney disease: Secondary | ICD-10-CM | POA: Diagnosis not present

## 2016-11-14 DIAGNOSIS — E876 Hypokalemia: Secondary | ICD-10-CM | POA: Diagnosis not present

## 2016-11-14 DIAGNOSIS — N186 End stage renal disease: Secondary | ICD-10-CM | POA: Diagnosis not present

## 2016-11-14 DIAGNOSIS — T8579XA Infection and inflammatory reaction due to other internal prosthetic devices, implants and grafts, initial encounter: Secondary | ICD-10-CM | POA: Diagnosis not present

## 2016-11-14 DIAGNOSIS — N2581 Secondary hyperparathyroidism of renal origin: Secondary | ICD-10-CM | POA: Diagnosis not present

## 2016-11-14 DIAGNOSIS — E1129 Type 2 diabetes mellitus with other diabetic kidney complication: Secondary | ICD-10-CM | POA: Diagnosis not present

## 2016-11-15 DIAGNOSIS — I953 Hypotension of hemodialysis: Secondary | ICD-10-CM | POA: Diagnosis not present

## 2016-11-15 DIAGNOSIS — E0801 Diabetes mellitus due to underlying condition with hyperosmolarity with coma: Secondary | ICD-10-CM | POA: Diagnosis not present

## 2016-11-15 DIAGNOSIS — Z942 Lung transplant status: Secondary | ICD-10-CM | POA: Diagnosis not present

## 2016-11-15 DIAGNOSIS — Z8711 Personal history of peptic ulcer disease: Secondary | ICD-10-CM | POA: Diagnosis not present

## 2016-11-15 DIAGNOSIS — Z77098 Contact with and (suspected) exposure to other hazardous, chiefly nonmedicinal, chemicals: Secondary | ICD-10-CM | POA: Diagnosis not present

## 2016-11-15 DIAGNOSIS — E1122 Type 2 diabetes mellitus with diabetic chronic kidney disease: Secondary | ICD-10-CM | POA: Diagnosis not present

## 2016-11-15 DIAGNOSIS — N186 End stage renal disease: Secondary | ICD-10-CM | POA: Diagnosis not present

## 2016-11-15 DIAGNOSIS — Z79899 Other long term (current) drug therapy: Secondary | ICD-10-CM | POA: Diagnosis not present

## 2016-11-15 DIAGNOSIS — R251 Tremor, unspecified: Secondary | ICD-10-CM | POA: Diagnosis not present

## 2016-11-15 DIAGNOSIS — Z794 Long term (current) use of insulin: Secondary | ICD-10-CM | POA: Diagnosis not present

## 2016-11-15 DIAGNOSIS — Z992 Dependence on renal dialysis: Secondary | ICD-10-CM | POA: Diagnosis not present

## 2016-11-15 DIAGNOSIS — R918 Other nonspecific abnormal finding of lung field: Secondary | ICD-10-CM | POA: Diagnosis not present

## 2016-11-15 DIAGNOSIS — G4700 Insomnia, unspecified: Secondary | ICD-10-CM | POA: Diagnosis not present

## 2016-11-15 DIAGNOSIS — R6881 Early satiety: Secondary | ICD-10-CM | POA: Diagnosis not present

## 2016-11-15 DIAGNOSIS — D899 Disorder involving the immune mechanism, unspecified: Secondary | ICD-10-CM | POA: Diagnosis not present

## 2016-11-15 DIAGNOSIS — N185 Chronic kidney disease, stage 5: Secondary | ICD-10-CM | POA: Diagnosis not present

## 2016-11-15 DIAGNOSIS — Z4824 Encounter for aftercare following lung transplant: Secondary | ICD-10-CM | POA: Diagnosis not present

## 2016-11-15 DIAGNOSIS — B258 Other cytomegaloviral diseases: Secondary | ICD-10-CM | POA: Diagnosis not present

## 2016-11-15 DIAGNOSIS — K219 Gastro-esophageal reflux disease without esophagitis: Secondary | ICD-10-CM | POA: Diagnosis not present

## 2016-11-15 DIAGNOSIS — R5381 Other malaise: Secondary | ICD-10-CM | POA: Diagnosis not present

## 2016-11-15 DIAGNOSIS — J8410 Pulmonary fibrosis, unspecified: Secondary | ICD-10-CM | POA: Diagnosis not present

## 2016-11-16 DIAGNOSIS — N186 End stage renal disease: Secondary | ICD-10-CM | POA: Diagnosis not present

## 2016-11-16 DIAGNOSIS — N2581 Secondary hyperparathyroidism of renal origin: Secondary | ICD-10-CM | POA: Diagnosis not present

## 2016-11-16 DIAGNOSIS — E876 Hypokalemia: Secondary | ICD-10-CM | POA: Diagnosis not present

## 2016-11-16 DIAGNOSIS — T8579XA Infection and inflammatory reaction due to other internal prosthetic devices, implants and grafts, initial encounter: Secondary | ICD-10-CM | POA: Diagnosis not present

## 2016-11-16 DIAGNOSIS — E1129 Type 2 diabetes mellitus with other diabetic kidney complication: Secondary | ICD-10-CM | POA: Diagnosis not present

## 2016-11-16 DIAGNOSIS — D631 Anemia in chronic kidney disease: Secondary | ICD-10-CM | POA: Diagnosis not present

## 2016-11-18 DIAGNOSIS — D631 Anemia in chronic kidney disease: Secondary | ICD-10-CM | POA: Diagnosis not present

## 2016-11-18 DIAGNOSIS — E1129 Type 2 diabetes mellitus with other diabetic kidney complication: Secondary | ICD-10-CM | POA: Diagnosis not present

## 2016-11-18 DIAGNOSIS — E876 Hypokalemia: Secondary | ICD-10-CM | POA: Diagnosis not present

## 2016-11-18 DIAGNOSIS — N2581 Secondary hyperparathyroidism of renal origin: Secondary | ICD-10-CM | POA: Diagnosis not present

## 2016-11-18 DIAGNOSIS — N186 End stage renal disease: Secondary | ICD-10-CM | POA: Diagnosis not present

## 2016-11-18 DIAGNOSIS — T8579XA Infection and inflammatory reaction due to other internal prosthetic devices, implants and grafts, initial encounter: Secondary | ICD-10-CM | POA: Diagnosis not present

## 2016-11-21 DIAGNOSIS — E1129 Type 2 diabetes mellitus with other diabetic kidney complication: Secondary | ICD-10-CM | POA: Diagnosis not present

## 2016-11-21 DIAGNOSIS — T8579XA Infection and inflammatory reaction due to other internal prosthetic devices, implants and grafts, initial encounter: Secondary | ICD-10-CM | POA: Diagnosis not present

## 2016-11-21 DIAGNOSIS — D631 Anemia in chronic kidney disease: Secondary | ICD-10-CM | POA: Diagnosis not present

## 2016-11-21 DIAGNOSIS — E876 Hypokalemia: Secondary | ICD-10-CM | POA: Diagnosis not present

## 2016-11-21 DIAGNOSIS — N2581 Secondary hyperparathyroidism of renal origin: Secondary | ICD-10-CM | POA: Diagnosis not present

## 2016-11-21 DIAGNOSIS — N186 End stage renal disease: Secondary | ICD-10-CM | POA: Diagnosis not present

## 2016-11-23 DIAGNOSIS — E1129 Type 2 diabetes mellitus with other diabetic kidney complication: Secondary | ICD-10-CM | POA: Diagnosis not present

## 2016-11-23 DIAGNOSIS — H4063X1 Glaucoma secondary to drugs, bilateral, mild stage: Secondary | ICD-10-CM | POA: Diagnosis not present

## 2016-11-23 DIAGNOSIS — H25812 Combined forms of age-related cataract, left eye: Secondary | ICD-10-CM | POA: Diagnosis not present

## 2016-11-23 DIAGNOSIS — N2581 Secondary hyperparathyroidism of renal origin: Secondary | ICD-10-CM | POA: Diagnosis not present

## 2016-11-23 DIAGNOSIS — H2512 Age-related nuclear cataract, left eye: Secondary | ICD-10-CM | POA: Diagnosis not present

## 2016-11-23 DIAGNOSIS — D631 Anemia in chronic kidney disease: Secondary | ICD-10-CM | POA: Diagnosis not present

## 2016-11-23 DIAGNOSIS — T8579XA Infection and inflammatory reaction due to other internal prosthetic devices, implants and grafts, initial encounter: Secondary | ICD-10-CM | POA: Diagnosis not present

## 2016-11-23 DIAGNOSIS — E876 Hypokalemia: Secondary | ICD-10-CM | POA: Diagnosis not present

## 2016-11-23 DIAGNOSIS — N186 End stage renal disease: Secondary | ICD-10-CM | POA: Diagnosis not present

## 2016-11-23 DIAGNOSIS — H401122 Primary open-angle glaucoma, left eye, moderate stage: Secondary | ICD-10-CM | POA: Diagnosis not present

## 2016-11-25 DIAGNOSIS — N186 End stage renal disease: Secondary | ICD-10-CM | POA: Diagnosis not present

## 2016-11-25 DIAGNOSIS — D631 Anemia in chronic kidney disease: Secondary | ICD-10-CM | POA: Diagnosis not present

## 2016-11-25 DIAGNOSIS — T8579XA Infection and inflammatory reaction due to other internal prosthetic devices, implants and grafts, initial encounter: Secondary | ICD-10-CM | POA: Diagnosis not present

## 2016-11-25 DIAGNOSIS — E876 Hypokalemia: Secondary | ICD-10-CM | POA: Diagnosis not present

## 2016-11-25 DIAGNOSIS — N2581 Secondary hyperparathyroidism of renal origin: Secondary | ICD-10-CM | POA: Diagnosis not present

## 2016-11-25 DIAGNOSIS — E1129 Type 2 diabetes mellitus with other diabetic kidney complication: Secondary | ICD-10-CM | POA: Diagnosis not present

## 2016-11-28 DIAGNOSIS — N2581 Secondary hyperparathyroidism of renal origin: Secondary | ICD-10-CM | POA: Diagnosis not present

## 2016-11-28 DIAGNOSIS — T8579XA Infection and inflammatory reaction due to other internal prosthetic devices, implants and grafts, initial encounter: Secondary | ICD-10-CM | POA: Diagnosis not present

## 2016-11-28 DIAGNOSIS — D631 Anemia in chronic kidney disease: Secondary | ICD-10-CM | POA: Diagnosis not present

## 2016-11-28 DIAGNOSIS — E876 Hypokalemia: Secondary | ICD-10-CM | POA: Diagnosis not present

## 2016-11-28 DIAGNOSIS — N186 End stage renal disease: Secondary | ICD-10-CM | POA: Diagnosis not present

## 2016-11-28 DIAGNOSIS — E1129 Type 2 diabetes mellitus with other diabetic kidney complication: Secondary | ICD-10-CM | POA: Diagnosis not present

## 2016-11-30 DIAGNOSIS — E876 Hypokalemia: Secondary | ICD-10-CM | POA: Diagnosis not present

## 2016-11-30 DIAGNOSIS — N2581 Secondary hyperparathyroidism of renal origin: Secondary | ICD-10-CM | POA: Diagnosis not present

## 2016-11-30 DIAGNOSIS — T8579XA Infection and inflammatory reaction due to other internal prosthetic devices, implants and grafts, initial encounter: Secondary | ICD-10-CM | POA: Diagnosis not present

## 2016-11-30 DIAGNOSIS — E1129 Type 2 diabetes mellitus with other diabetic kidney complication: Secondary | ICD-10-CM | POA: Diagnosis not present

## 2016-11-30 DIAGNOSIS — D631 Anemia in chronic kidney disease: Secondary | ICD-10-CM | POA: Diagnosis not present

## 2016-11-30 DIAGNOSIS — N186 End stage renal disease: Secondary | ICD-10-CM | POA: Diagnosis not present

## 2016-12-03 DIAGNOSIS — T86819 Unspecified complication of lung transplant: Secondary | ICD-10-CM | POA: Diagnosis not present

## 2016-12-03 DIAGNOSIS — N186 End stage renal disease: Secondary | ICD-10-CM | POA: Diagnosis not present

## 2016-12-03 DIAGNOSIS — Z992 Dependence on renal dialysis: Secondary | ICD-10-CM | POA: Diagnosis not present

## 2016-12-05 DIAGNOSIS — E876 Hypokalemia: Secondary | ICD-10-CM | POA: Diagnosis not present

## 2016-12-05 DIAGNOSIS — D631 Anemia in chronic kidney disease: Secondary | ICD-10-CM | POA: Diagnosis not present

## 2016-12-05 DIAGNOSIS — E1129 Type 2 diabetes mellitus with other diabetic kidney complication: Secondary | ICD-10-CM | POA: Diagnosis not present

## 2016-12-05 DIAGNOSIS — N186 End stage renal disease: Secondary | ICD-10-CM | POA: Diagnosis not present

## 2016-12-05 DIAGNOSIS — T8579XA Infection and inflammatory reaction due to other internal prosthetic devices, implants and grafts, initial encounter: Secondary | ICD-10-CM | POA: Diagnosis not present

## 2016-12-05 DIAGNOSIS — N2581 Secondary hyperparathyroidism of renal origin: Secondary | ICD-10-CM | POA: Diagnosis not present

## 2016-12-07 DIAGNOSIS — N2581 Secondary hyperparathyroidism of renal origin: Secondary | ICD-10-CM | POA: Diagnosis not present

## 2016-12-07 DIAGNOSIS — T8579XA Infection and inflammatory reaction due to other internal prosthetic devices, implants and grafts, initial encounter: Secondary | ICD-10-CM | POA: Diagnosis not present

## 2016-12-07 DIAGNOSIS — N186 End stage renal disease: Secondary | ICD-10-CM | POA: Diagnosis not present

## 2016-12-07 DIAGNOSIS — E1129 Type 2 diabetes mellitus with other diabetic kidney complication: Secondary | ICD-10-CM | POA: Diagnosis not present

## 2016-12-07 DIAGNOSIS — D631 Anemia in chronic kidney disease: Secondary | ICD-10-CM | POA: Diagnosis not present

## 2016-12-07 DIAGNOSIS — E876 Hypokalemia: Secondary | ICD-10-CM | POA: Diagnosis not present

## 2016-12-09 DIAGNOSIS — T8579XA Infection and inflammatory reaction due to other internal prosthetic devices, implants and grafts, initial encounter: Secondary | ICD-10-CM | POA: Diagnosis not present

## 2016-12-09 DIAGNOSIS — N2581 Secondary hyperparathyroidism of renal origin: Secondary | ICD-10-CM | POA: Diagnosis not present

## 2016-12-09 DIAGNOSIS — E1129 Type 2 diabetes mellitus with other diabetic kidney complication: Secondary | ICD-10-CM | POA: Diagnosis not present

## 2016-12-09 DIAGNOSIS — D631 Anemia in chronic kidney disease: Secondary | ICD-10-CM | POA: Diagnosis not present

## 2016-12-09 DIAGNOSIS — E876 Hypokalemia: Secondary | ICD-10-CM | POA: Diagnosis not present

## 2016-12-09 DIAGNOSIS — N186 End stage renal disease: Secondary | ICD-10-CM | POA: Diagnosis not present

## 2016-12-12 DIAGNOSIS — N2581 Secondary hyperparathyroidism of renal origin: Secondary | ICD-10-CM | POA: Diagnosis not present

## 2016-12-12 DIAGNOSIS — T8579XA Infection and inflammatory reaction due to other internal prosthetic devices, implants and grafts, initial encounter: Secondary | ICD-10-CM | POA: Diagnosis not present

## 2016-12-12 DIAGNOSIS — E1129 Type 2 diabetes mellitus with other diabetic kidney complication: Secondary | ICD-10-CM | POA: Diagnosis not present

## 2016-12-12 DIAGNOSIS — E876 Hypokalemia: Secondary | ICD-10-CM | POA: Diagnosis not present

## 2016-12-12 DIAGNOSIS — N186 End stage renal disease: Secondary | ICD-10-CM | POA: Diagnosis not present

## 2016-12-12 DIAGNOSIS — D631 Anemia in chronic kidney disease: Secondary | ICD-10-CM | POA: Diagnosis not present

## 2016-12-13 DIAGNOSIS — T82858A Stenosis of vascular prosthetic devices, implants and grafts, initial encounter: Secondary | ICD-10-CM | POA: Diagnosis not present

## 2016-12-13 DIAGNOSIS — N186 End stage renal disease: Secondary | ICD-10-CM | POA: Diagnosis not present

## 2016-12-13 DIAGNOSIS — I871 Compression of vein: Secondary | ICD-10-CM | POA: Diagnosis not present

## 2016-12-13 DIAGNOSIS — Z992 Dependence on renal dialysis: Secondary | ICD-10-CM | POA: Diagnosis not present

## 2016-12-14 DIAGNOSIS — E876 Hypokalemia: Secondary | ICD-10-CM | POA: Diagnosis not present

## 2016-12-14 DIAGNOSIS — E1129 Type 2 diabetes mellitus with other diabetic kidney complication: Secondary | ICD-10-CM | POA: Diagnosis not present

## 2016-12-14 DIAGNOSIS — D631 Anemia in chronic kidney disease: Secondary | ICD-10-CM | POA: Diagnosis not present

## 2016-12-14 DIAGNOSIS — N2581 Secondary hyperparathyroidism of renal origin: Secondary | ICD-10-CM | POA: Diagnosis not present

## 2016-12-14 DIAGNOSIS — T8579XA Infection and inflammatory reaction due to other internal prosthetic devices, implants and grafts, initial encounter: Secondary | ICD-10-CM | POA: Diagnosis not present

## 2016-12-14 DIAGNOSIS — N186 End stage renal disease: Secondary | ICD-10-CM | POA: Diagnosis not present

## 2016-12-16 DIAGNOSIS — E1129 Type 2 diabetes mellitus with other diabetic kidney complication: Secondary | ICD-10-CM | POA: Diagnosis not present

## 2016-12-16 DIAGNOSIS — D631 Anemia in chronic kidney disease: Secondary | ICD-10-CM | POA: Diagnosis not present

## 2016-12-16 DIAGNOSIS — T8579XA Infection and inflammatory reaction due to other internal prosthetic devices, implants and grafts, initial encounter: Secondary | ICD-10-CM | POA: Diagnosis not present

## 2016-12-16 DIAGNOSIS — E876 Hypokalemia: Secondary | ICD-10-CM | POA: Diagnosis not present

## 2016-12-16 DIAGNOSIS — N186 End stage renal disease: Secondary | ICD-10-CM | POA: Diagnosis not present

## 2016-12-16 DIAGNOSIS — N2581 Secondary hyperparathyroidism of renal origin: Secondary | ICD-10-CM | POA: Diagnosis not present

## 2016-12-19 DIAGNOSIS — N186 End stage renal disease: Secondary | ICD-10-CM | POA: Diagnosis not present

## 2016-12-19 DIAGNOSIS — N2581 Secondary hyperparathyroidism of renal origin: Secondary | ICD-10-CM | POA: Diagnosis not present

## 2016-12-19 DIAGNOSIS — E876 Hypokalemia: Secondary | ICD-10-CM | POA: Diagnosis not present

## 2016-12-19 DIAGNOSIS — D631 Anemia in chronic kidney disease: Secondary | ICD-10-CM | POA: Diagnosis not present

## 2016-12-19 DIAGNOSIS — T8579XA Infection and inflammatory reaction due to other internal prosthetic devices, implants and grafts, initial encounter: Secondary | ICD-10-CM | POA: Diagnosis not present

## 2016-12-19 DIAGNOSIS — E1129 Type 2 diabetes mellitus with other diabetic kidney complication: Secondary | ICD-10-CM | POA: Diagnosis not present

## 2016-12-20 DIAGNOSIS — H2511 Age-related nuclear cataract, right eye: Secondary | ICD-10-CM | POA: Diagnosis not present

## 2016-12-20 DIAGNOSIS — H25011 Cortical age-related cataract, right eye: Secondary | ICD-10-CM | POA: Diagnosis not present

## 2016-12-20 DIAGNOSIS — H25041 Posterior subcapsular polar age-related cataract, right eye: Secondary | ICD-10-CM | POA: Diagnosis not present

## 2016-12-21 DIAGNOSIS — E876 Hypokalemia: Secondary | ICD-10-CM | POA: Diagnosis not present

## 2016-12-21 DIAGNOSIS — T8579XA Infection and inflammatory reaction due to other internal prosthetic devices, implants and grafts, initial encounter: Secondary | ICD-10-CM | POA: Diagnosis not present

## 2016-12-21 DIAGNOSIS — E1129 Type 2 diabetes mellitus with other diabetic kidney complication: Secondary | ICD-10-CM | POA: Diagnosis not present

## 2016-12-21 DIAGNOSIS — N186 End stage renal disease: Secondary | ICD-10-CM | POA: Diagnosis not present

## 2016-12-21 DIAGNOSIS — N2581 Secondary hyperparathyroidism of renal origin: Secondary | ICD-10-CM | POA: Diagnosis not present

## 2016-12-21 DIAGNOSIS — D631 Anemia in chronic kidney disease: Secondary | ICD-10-CM | POA: Diagnosis not present

## 2016-12-22 DIAGNOSIS — Z418 Encounter for other procedures for purposes other than remedying health state: Secondary | ICD-10-CM | POA: Diagnosis not present

## 2016-12-22 DIAGNOSIS — Z79899 Other long term (current) drug therapy: Secondary | ICD-10-CM | POA: Diagnosis not present

## 2016-12-22 DIAGNOSIS — D899 Disorder involving the immune mechanism, unspecified: Secondary | ICD-10-CM | POA: Diagnosis not present

## 2016-12-22 DIAGNOSIS — Z942 Lung transplant status: Secondary | ICD-10-CM | POA: Diagnosis not present

## 2016-12-22 DIAGNOSIS — N186 End stage renal disease: Secondary | ICD-10-CM | POA: Diagnosis not present

## 2016-12-22 DIAGNOSIS — N189 Chronic kidney disease, unspecified: Secondary | ICD-10-CM | POA: Diagnosis not present

## 2016-12-22 DIAGNOSIS — T86819 Unspecified complication of lung transplant: Secondary | ICD-10-CM | POA: Diagnosis not present

## 2016-12-23 DIAGNOSIS — T8579XA Infection and inflammatory reaction due to other internal prosthetic devices, implants and grafts, initial encounter: Secondary | ICD-10-CM | POA: Diagnosis not present

## 2016-12-23 DIAGNOSIS — E876 Hypokalemia: Secondary | ICD-10-CM | POA: Diagnosis not present

## 2016-12-23 DIAGNOSIS — N186 End stage renal disease: Secondary | ICD-10-CM | POA: Diagnosis not present

## 2016-12-23 DIAGNOSIS — E1129 Type 2 diabetes mellitus with other diabetic kidney complication: Secondary | ICD-10-CM | POA: Diagnosis not present

## 2016-12-23 DIAGNOSIS — N2581 Secondary hyperparathyroidism of renal origin: Secondary | ICD-10-CM | POA: Diagnosis not present

## 2016-12-23 DIAGNOSIS — D631 Anemia in chronic kidney disease: Secondary | ICD-10-CM | POA: Diagnosis not present

## 2016-12-26 DIAGNOSIS — D631 Anemia in chronic kidney disease: Secondary | ICD-10-CM | POA: Diagnosis not present

## 2016-12-26 DIAGNOSIS — N186 End stage renal disease: Secondary | ICD-10-CM | POA: Diagnosis not present

## 2016-12-26 DIAGNOSIS — T8579XA Infection and inflammatory reaction due to other internal prosthetic devices, implants and grafts, initial encounter: Secondary | ICD-10-CM | POA: Diagnosis not present

## 2016-12-26 DIAGNOSIS — E876 Hypokalemia: Secondary | ICD-10-CM | POA: Diagnosis not present

## 2016-12-26 DIAGNOSIS — N2581 Secondary hyperparathyroidism of renal origin: Secondary | ICD-10-CM | POA: Diagnosis not present

## 2016-12-26 DIAGNOSIS — E1129 Type 2 diabetes mellitus with other diabetic kidney complication: Secondary | ICD-10-CM | POA: Diagnosis not present

## 2016-12-28 DIAGNOSIS — T8579XA Infection and inflammatory reaction due to other internal prosthetic devices, implants and grafts, initial encounter: Secondary | ICD-10-CM | POA: Diagnosis not present

## 2016-12-28 DIAGNOSIS — N186 End stage renal disease: Secondary | ICD-10-CM | POA: Diagnosis not present

## 2016-12-28 DIAGNOSIS — N2581 Secondary hyperparathyroidism of renal origin: Secondary | ICD-10-CM | POA: Diagnosis not present

## 2016-12-28 DIAGNOSIS — D631 Anemia in chronic kidney disease: Secondary | ICD-10-CM | POA: Diagnosis not present

## 2016-12-28 DIAGNOSIS — E1129 Type 2 diabetes mellitus with other diabetic kidney complication: Secondary | ICD-10-CM | POA: Diagnosis not present

## 2016-12-28 DIAGNOSIS — E876 Hypokalemia: Secondary | ICD-10-CM | POA: Diagnosis not present

## 2016-12-30 DIAGNOSIS — T8579XA Infection and inflammatory reaction due to other internal prosthetic devices, implants and grafts, initial encounter: Secondary | ICD-10-CM | POA: Diagnosis not present

## 2016-12-30 DIAGNOSIS — D631 Anemia in chronic kidney disease: Secondary | ICD-10-CM | POA: Diagnosis not present

## 2016-12-30 DIAGNOSIS — E876 Hypokalemia: Secondary | ICD-10-CM | POA: Diagnosis not present

## 2016-12-30 DIAGNOSIS — N2581 Secondary hyperparathyroidism of renal origin: Secondary | ICD-10-CM | POA: Diagnosis not present

## 2016-12-30 DIAGNOSIS — N186 End stage renal disease: Secondary | ICD-10-CM | POA: Diagnosis not present

## 2016-12-30 DIAGNOSIS — E1129 Type 2 diabetes mellitus with other diabetic kidney complication: Secondary | ICD-10-CM | POA: Diagnosis not present

## 2017-01-02 DIAGNOSIS — T8579XA Infection and inflammatory reaction due to other internal prosthetic devices, implants and grafts, initial encounter: Secondary | ICD-10-CM | POA: Diagnosis not present

## 2017-01-02 DIAGNOSIS — N2581 Secondary hyperparathyroidism of renal origin: Secondary | ICD-10-CM | POA: Diagnosis not present

## 2017-01-02 DIAGNOSIS — D631 Anemia in chronic kidney disease: Secondary | ICD-10-CM | POA: Diagnosis not present

## 2017-01-02 DIAGNOSIS — E1129 Type 2 diabetes mellitus with other diabetic kidney complication: Secondary | ICD-10-CM | POA: Diagnosis not present

## 2017-01-02 DIAGNOSIS — N186 End stage renal disease: Secondary | ICD-10-CM | POA: Diagnosis not present

## 2017-01-02 DIAGNOSIS — E876 Hypokalemia: Secondary | ICD-10-CM | POA: Diagnosis not present

## 2017-01-03 DIAGNOSIS — Z992 Dependence on renal dialysis: Secondary | ICD-10-CM | POA: Diagnosis not present

## 2017-01-03 DIAGNOSIS — N186 End stage renal disease: Secondary | ICD-10-CM | POA: Diagnosis not present

## 2017-01-03 DIAGNOSIS — T86819 Unspecified complication of lung transplant: Secondary | ICD-10-CM | POA: Diagnosis not present

## 2017-01-04 DIAGNOSIS — N186 End stage renal disease: Secondary | ICD-10-CM | POA: Diagnosis not present

## 2017-01-04 DIAGNOSIS — E1129 Type 2 diabetes mellitus with other diabetic kidney complication: Secondary | ICD-10-CM | POA: Diagnosis not present

## 2017-01-04 DIAGNOSIS — N2581 Secondary hyperparathyroidism of renal origin: Secondary | ICD-10-CM | POA: Diagnosis not present

## 2017-01-04 DIAGNOSIS — E876 Hypokalemia: Secondary | ICD-10-CM | POA: Diagnosis not present

## 2017-01-04 DIAGNOSIS — D631 Anemia in chronic kidney disease: Secondary | ICD-10-CM | POA: Diagnosis not present

## 2017-01-04 DIAGNOSIS — T8579XA Infection and inflammatory reaction due to other internal prosthetic devices, implants and grafts, initial encounter: Secondary | ICD-10-CM | POA: Diagnosis not present

## 2017-01-06 DIAGNOSIS — D631 Anemia in chronic kidney disease: Secondary | ICD-10-CM | POA: Diagnosis not present

## 2017-01-06 DIAGNOSIS — E1129 Type 2 diabetes mellitus with other diabetic kidney complication: Secondary | ICD-10-CM | POA: Diagnosis not present

## 2017-01-06 DIAGNOSIS — N2581 Secondary hyperparathyroidism of renal origin: Secondary | ICD-10-CM | POA: Diagnosis not present

## 2017-01-06 DIAGNOSIS — T8579XA Infection and inflammatory reaction due to other internal prosthetic devices, implants and grafts, initial encounter: Secondary | ICD-10-CM | POA: Diagnosis not present

## 2017-01-06 DIAGNOSIS — N186 End stage renal disease: Secondary | ICD-10-CM | POA: Diagnosis not present

## 2017-01-06 DIAGNOSIS — E876 Hypokalemia: Secondary | ICD-10-CM | POA: Diagnosis not present

## 2017-01-09 DIAGNOSIS — N2581 Secondary hyperparathyroidism of renal origin: Secondary | ICD-10-CM | POA: Diagnosis not present

## 2017-01-09 DIAGNOSIS — D631 Anemia in chronic kidney disease: Secondary | ICD-10-CM | POA: Diagnosis not present

## 2017-01-09 DIAGNOSIS — N186 End stage renal disease: Secondary | ICD-10-CM | POA: Diagnosis not present

## 2017-01-09 DIAGNOSIS — E876 Hypokalemia: Secondary | ICD-10-CM | POA: Diagnosis not present

## 2017-01-09 DIAGNOSIS — E1129 Type 2 diabetes mellitus with other diabetic kidney complication: Secondary | ICD-10-CM | POA: Diagnosis not present

## 2017-01-09 DIAGNOSIS — T8579XA Infection and inflammatory reaction due to other internal prosthetic devices, implants and grafts, initial encounter: Secondary | ICD-10-CM | POA: Diagnosis not present

## 2017-01-10 DIAGNOSIS — Z87891 Personal history of nicotine dependence: Secondary | ICD-10-CM | POA: Diagnosis not present

## 2017-01-10 DIAGNOSIS — E119 Type 2 diabetes mellitus without complications: Secondary | ICD-10-CM | POA: Diagnosis not present

## 2017-01-10 DIAGNOSIS — Z794 Long term (current) use of insulin: Secondary | ICD-10-CM | POA: Diagnosis not present

## 2017-01-11 DIAGNOSIS — E1129 Type 2 diabetes mellitus with other diabetic kidney complication: Secondary | ICD-10-CM | POA: Diagnosis not present

## 2017-01-11 DIAGNOSIS — N2581 Secondary hyperparathyroidism of renal origin: Secondary | ICD-10-CM | POA: Diagnosis not present

## 2017-01-11 DIAGNOSIS — D631 Anemia in chronic kidney disease: Secondary | ICD-10-CM | POA: Diagnosis not present

## 2017-01-11 DIAGNOSIS — T8579XA Infection and inflammatory reaction due to other internal prosthetic devices, implants and grafts, initial encounter: Secondary | ICD-10-CM | POA: Diagnosis not present

## 2017-01-11 DIAGNOSIS — N186 End stage renal disease: Secondary | ICD-10-CM | POA: Diagnosis not present

## 2017-01-11 DIAGNOSIS — E876 Hypokalemia: Secondary | ICD-10-CM | POA: Diagnosis not present

## 2017-01-13 DIAGNOSIS — T8579XA Infection and inflammatory reaction due to other internal prosthetic devices, implants and grafts, initial encounter: Secondary | ICD-10-CM | POA: Diagnosis not present

## 2017-01-13 DIAGNOSIS — E1129 Type 2 diabetes mellitus with other diabetic kidney complication: Secondary | ICD-10-CM | POA: Diagnosis not present

## 2017-01-13 DIAGNOSIS — N186 End stage renal disease: Secondary | ICD-10-CM | POA: Diagnosis not present

## 2017-01-13 DIAGNOSIS — N2581 Secondary hyperparathyroidism of renal origin: Secondary | ICD-10-CM | POA: Diagnosis not present

## 2017-01-13 DIAGNOSIS — E876 Hypokalemia: Secondary | ICD-10-CM | POA: Diagnosis not present

## 2017-01-13 DIAGNOSIS — D631 Anemia in chronic kidney disease: Secondary | ICD-10-CM | POA: Diagnosis not present

## 2017-01-16 DIAGNOSIS — N2581 Secondary hyperparathyroidism of renal origin: Secondary | ICD-10-CM | POA: Diagnosis not present

## 2017-01-16 DIAGNOSIS — E1129 Type 2 diabetes mellitus with other diabetic kidney complication: Secondary | ICD-10-CM | POA: Diagnosis not present

## 2017-01-16 DIAGNOSIS — D631 Anemia in chronic kidney disease: Secondary | ICD-10-CM | POA: Diagnosis not present

## 2017-01-16 DIAGNOSIS — E876 Hypokalemia: Secondary | ICD-10-CM | POA: Diagnosis not present

## 2017-01-16 DIAGNOSIS — N186 End stage renal disease: Secondary | ICD-10-CM | POA: Diagnosis not present

## 2017-01-16 DIAGNOSIS — T8579XA Infection and inflammatory reaction due to other internal prosthetic devices, implants and grafts, initial encounter: Secondary | ICD-10-CM | POA: Diagnosis not present

## 2017-01-18 DIAGNOSIS — E1129 Type 2 diabetes mellitus with other diabetic kidney complication: Secondary | ICD-10-CM | POA: Diagnosis not present

## 2017-01-18 DIAGNOSIS — N186 End stage renal disease: Secondary | ICD-10-CM | POA: Diagnosis not present

## 2017-01-18 DIAGNOSIS — T8579XA Infection and inflammatory reaction due to other internal prosthetic devices, implants and grafts, initial encounter: Secondary | ICD-10-CM | POA: Diagnosis not present

## 2017-01-18 DIAGNOSIS — E876 Hypokalemia: Secondary | ICD-10-CM | POA: Diagnosis not present

## 2017-01-18 DIAGNOSIS — N2581 Secondary hyperparathyroidism of renal origin: Secondary | ICD-10-CM | POA: Diagnosis not present

## 2017-01-18 DIAGNOSIS — D631 Anemia in chronic kidney disease: Secondary | ICD-10-CM | POA: Diagnosis not present

## 2017-01-20 DIAGNOSIS — D631 Anemia in chronic kidney disease: Secondary | ICD-10-CM | POA: Diagnosis not present

## 2017-01-20 DIAGNOSIS — N2581 Secondary hyperparathyroidism of renal origin: Secondary | ICD-10-CM | POA: Diagnosis not present

## 2017-01-20 DIAGNOSIS — E1129 Type 2 diabetes mellitus with other diabetic kidney complication: Secondary | ICD-10-CM | POA: Diagnosis not present

## 2017-01-20 DIAGNOSIS — T8579XA Infection and inflammatory reaction due to other internal prosthetic devices, implants and grafts, initial encounter: Secondary | ICD-10-CM | POA: Diagnosis not present

## 2017-01-20 DIAGNOSIS — N186 End stage renal disease: Secondary | ICD-10-CM | POA: Diagnosis not present

## 2017-01-20 DIAGNOSIS — E876 Hypokalemia: Secondary | ICD-10-CM | POA: Diagnosis not present

## 2017-01-23 DIAGNOSIS — N2581 Secondary hyperparathyroidism of renal origin: Secondary | ICD-10-CM | POA: Diagnosis not present

## 2017-01-23 DIAGNOSIS — N186 End stage renal disease: Secondary | ICD-10-CM | POA: Diagnosis not present

## 2017-01-23 DIAGNOSIS — E1129 Type 2 diabetes mellitus with other diabetic kidney complication: Secondary | ICD-10-CM | POA: Diagnosis not present

## 2017-01-23 DIAGNOSIS — E876 Hypokalemia: Secondary | ICD-10-CM | POA: Diagnosis not present

## 2017-01-23 DIAGNOSIS — D631 Anemia in chronic kidney disease: Secondary | ICD-10-CM | POA: Diagnosis not present

## 2017-01-23 DIAGNOSIS — T8579XA Infection and inflammatory reaction due to other internal prosthetic devices, implants and grafts, initial encounter: Secondary | ICD-10-CM | POA: Diagnosis not present

## 2017-01-25 DIAGNOSIS — N2581 Secondary hyperparathyroidism of renal origin: Secondary | ICD-10-CM | POA: Diagnosis not present

## 2017-01-25 DIAGNOSIS — T8579XA Infection and inflammatory reaction due to other internal prosthetic devices, implants and grafts, initial encounter: Secondary | ICD-10-CM | POA: Diagnosis not present

## 2017-01-25 DIAGNOSIS — E1129 Type 2 diabetes mellitus with other diabetic kidney complication: Secondary | ICD-10-CM | POA: Diagnosis not present

## 2017-01-25 DIAGNOSIS — N186 End stage renal disease: Secondary | ICD-10-CM | POA: Diagnosis not present

## 2017-01-25 DIAGNOSIS — D631 Anemia in chronic kidney disease: Secondary | ICD-10-CM | POA: Diagnosis not present

## 2017-01-25 DIAGNOSIS — E876 Hypokalemia: Secondary | ICD-10-CM | POA: Diagnosis not present

## 2017-01-26 DIAGNOSIS — Z942 Lung transplant status: Secondary | ICD-10-CM | POA: Diagnosis not present

## 2017-01-26 DIAGNOSIS — K219 Gastro-esophageal reflux disease without esophagitis: Secondary | ICD-10-CM | POA: Diagnosis not present

## 2017-01-26 DIAGNOSIS — I10 Essential (primary) hypertension: Secondary | ICD-10-CM | POA: Diagnosis not present

## 2017-01-26 DIAGNOSIS — J849 Interstitial pulmonary disease, unspecified: Secondary | ICD-10-CM | POA: Diagnosis not present

## 2017-01-26 DIAGNOSIS — J9611 Chronic respiratory failure with hypoxia: Secondary | ICD-10-CM | POA: Diagnosis not present

## 2017-01-26 DIAGNOSIS — I270 Primary pulmonary hypertension: Secondary | ICD-10-CM | POA: Diagnosis not present

## 2017-01-26 DIAGNOSIS — Z992 Dependence on renal dialysis: Secondary | ICD-10-CM | POA: Diagnosis not present

## 2017-01-26 DIAGNOSIS — I739 Peripheral vascular disease, unspecified: Secondary | ICD-10-CM | POA: Diagnosis not present

## 2017-01-26 DIAGNOSIS — Z794 Long term (current) use of insulin: Secondary | ICD-10-CM | POA: Diagnosis not present

## 2017-01-26 DIAGNOSIS — N186 End stage renal disease: Secondary | ICD-10-CM | POA: Diagnosis not present

## 2017-01-26 DIAGNOSIS — E1122 Type 2 diabetes mellitus with diabetic chronic kidney disease: Secondary | ICD-10-CM | POA: Diagnosis not present

## 2017-01-27 DIAGNOSIS — E876 Hypokalemia: Secondary | ICD-10-CM | POA: Diagnosis not present

## 2017-01-27 DIAGNOSIS — T8579XA Infection and inflammatory reaction due to other internal prosthetic devices, implants and grafts, initial encounter: Secondary | ICD-10-CM | POA: Diagnosis not present

## 2017-01-27 DIAGNOSIS — N186 End stage renal disease: Secondary | ICD-10-CM | POA: Diagnosis not present

## 2017-01-27 DIAGNOSIS — N2581 Secondary hyperparathyroidism of renal origin: Secondary | ICD-10-CM | POA: Diagnosis not present

## 2017-01-27 DIAGNOSIS — D631 Anemia in chronic kidney disease: Secondary | ICD-10-CM | POA: Diagnosis not present

## 2017-01-27 DIAGNOSIS — E1129 Type 2 diabetes mellitus with other diabetic kidney complication: Secondary | ICD-10-CM | POA: Diagnosis not present

## 2017-01-30 DIAGNOSIS — N2581 Secondary hyperparathyroidism of renal origin: Secondary | ICD-10-CM | POA: Diagnosis not present

## 2017-01-30 DIAGNOSIS — E876 Hypokalemia: Secondary | ICD-10-CM | POA: Diagnosis not present

## 2017-01-30 DIAGNOSIS — D631 Anemia in chronic kidney disease: Secondary | ICD-10-CM | POA: Diagnosis not present

## 2017-01-30 DIAGNOSIS — T8579XA Infection and inflammatory reaction due to other internal prosthetic devices, implants and grafts, initial encounter: Secondary | ICD-10-CM | POA: Diagnosis not present

## 2017-01-30 DIAGNOSIS — E1129 Type 2 diabetes mellitus with other diabetic kidney complication: Secondary | ICD-10-CM | POA: Diagnosis not present

## 2017-01-30 DIAGNOSIS — N186 End stage renal disease: Secondary | ICD-10-CM | POA: Diagnosis not present

## 2017-02-01 DIAGNOSIS — E1129 Type 2 diabetes mellitus with other diabetic kidney complication: Secondary | ICD-10-CM | POA: Diagnosis not present

## 2017-02-01 DIAGNOSIS — N186 End stage renal disease: Secondary | ICD-10-CM | POA: Diagnosis not present

## 2017-02-01 DIAGNOSIS — E876 Hypokalemia: Secondary | ICD-10-CM | POA: Diagnosis not present

## 2017-02-01 DIAGNOSIS — D631 Anemia in chronic kidney disease: Secondary | ICD-10-CM | POA: Diagnosis not present

## 2017-02-01 DIAGNOSIS — T8579XA Infection and inflammatory reaction due to other internal prosthetic devices, implants and grafts, initial encounter: Secondary | ICD-10-CM | POA: Diagnosis not present

## 2017-02-01 DIAGNOSIS — N2581 Secondary hyperparathyroidism of renal origin: Secondary | ICD-10-CM | POA: Diagnosis not present

## 2017-02-03 DIAGNOSIS — E876 Hypokalemia: Secondary | ICD-10-CM | POA: Diagnosis not present

## 2017-02-03 DIAGNOSIS — N2581 Secondary hyperparathyroidism of renal origin: Secondary | ICD-10-CM | POA: Diagnosis not present

## 2017-02-03 DIAGNOSIS — Z992 Dependence on renal dialysis: Secondary | ICD-10-CM | POA: Diagnosis not present

## 2017-02-03 DIAGNOSIS — T8579XA Infection and inflammatory reaction due to other internal prosthetic devices, implants and grafts, initial encounter: Secondary | ICD-10-CM | POA: Diagnosis not present

## 2017-02-03 DIAGNOSIS — E1129 Type 2 diabetes mellitus with other diabetic kidney complication: Secondary | ICD-10-CM | POA: Diagnosis not present

## 2017-02-03 DIAGNOSIS — T86819 Unspecified complication of lung transplant: Secondary | ICD-10-CM | POA: Diagnosis not present

## 2017-02-03 DIAGNOSIS — D631 Anemia in chronic kidney disease: Secondary | ICD-10-CM | POA: Diagnosis not present

## 2017-02-03 DIAGNOSIS — N186 End stage renal disease: Secondary | ICD-10-CM | POA: Diagnosis not present

## 2017-02-06 DIAGNOSIS — Z23 Encounter for immunization: Secondary | ICD-10-CM | POA: Diagnosis not present

## 2017-02-06 DIAGNOSIS — N2581 Secondary hyperparathyroidism of renal origin: Secondary | ICD-10-CM | POA: Diagnosis not present

## 2017-02-06 DIAGNOSIS — E876 Hypokalemia: Secondary | ICD-10-CM | POA: Diagnosis not present

## 2017-02-06 DIAGNOSIS — N186 End stage renal disease: Secondary | ICD-10-CM | POA: Diagnosis not present

## 2017-02-06 DIAGNOSIS — T8579XA Infection and inflammatory reaction due to other internal prosthetic devices, implants and grafts, initial encounter: Secondary | ICD-10-CM | POA: Diagnosis not present

## 2017-02-06 DIAGNOSIS — E1129 Type 2 diabetes mellitus with other diabetic kidney complication: Secondary | ICD-10-CM | POA: Diagnosis not present

## 2017-02-08 DIAGNOSIS — H4061X1 Glaucoma secondary to drugs, right eye, mild stage: Secondary | ICD-10-CM | POA: Diagnosis not present

## 2017-02-08 DIAGNOSIS — N186 End stage renal disease: Secondary | ICD-10-CM | POA: Diagnosis not present

## 2017-02-08 DIAGNOSIS — H25811 Combined forms of age-related cataract, right eye: Secondary | ICD-10-CM | POA: Diagnosis not present

## 2017-02-08 DIAGNOSIS — N2581 Secondary hyperparathyroidism of renal origin: Secondary | ICD-10-CM | POA: Diagnosis not present

## 2017-02-08 DIAGNOSIS — H2511 Age-related nuclear cataract, right eye: Secondary | ICD-10-CM | POA: Diagnosis not present

## 2017-02-08 DIAGNOSIS — E1129 Type 2 diabetes mellitus with other diabetic kidney complication: Secondary | ICD-10-CM | POA: Diagnosis not present

## 2017-02-08 DIAGNOSIS — H401111 Primary open-angle glaucoma, right eye, mild stage: Secondary | ICD-10-CM | POA: Diagnosis not present

## 2017-02-08 DIAGNOSIS — T8579XA Infection and inflammatory reaction due to other internal prosthetic devices, implants and grafts, initial encounter: Secondary | ICD-10-CM | POA: Diagnosis not present

## 2017-02-08 DIAGNOSIS — E876 Hypokalemia: Secondary | ICD-10-CM | POA: Diagnosis not present

## 2017-02-08 DIAGNOSIS — Z23 Encounter for immunization: Secondary | ICD-10-CM | POA: Diagnosis not present

## 2017-02-10 DIAGNOSIS — T8579XA Infection and inflammatory reaction due to other internal prosthetic devices, implants and grafts, initial encounter: Secondary | ICD-10-CM | POA: Diagnosis not present

## 2017-02-10 DIAGNOSIS — Z23 Encounter for immunization: Secondary | ICD-10-CM | POA: Diagnosis not present

## 2017-02-10 DIAGNOSIS — N186 End stage renal disease: Secondary | ICD-10-CM | POA: Diagnosis not present

## 2017-02-10 DIAGNOSIS — E1129 Type 2 diabetes mellitus with other diabetic kidney complication: Secondary | ICD-10-CM | POA: Diagnosis not present

## 2017-02-10 DIAGNOSIS — E876 Hypokalemia: Secondary | ICD-10-CM | POA: Diagnosis not present

## 2017-02-10 DIAGNOSIS — N2581 Secondary hyperparathyroidism of renal origin: Secondary | ICD-10-CM | POA: Diagnosis not present

## 2017-02-13 DIAGNOSIS — N2581 Secondary hyperparathyroidism of renal origin: Secondary | ICD-10-CM | POA: Diagnosis not present

## 2017-02-13 DIAGNOSIS — T8579XA Infection and inflammatory reaction due to other internal prosthetic devices, implants and grafts, initial encounter: Secondary | ICD-10-CM | POA: Diagnosis not present

## 2017-02-13 DIAGNOSIS — E876 Hypokalemia: Secondary | ICD-10-CM | POA: Diagnosis not present

## 2017-02-13 DIAGNOSIS — N186 End stage renal disease: Secondary | ICD-10-CM | POA: Diagnosis not present

## 2017-02-13 DIAGNOSIS — E1129 Type 2 diabetes mellitus with other diabetic kidney complication: Secondary | ICD-10-CM | POA: Diagnosis not present

## 2017-02-13 DIAGNOSIS — Z23 Encounter for immunization: Secondary | ICD-10-CM | POA: Diagnosis not present

## 2017-02-14 DIAGNOSIS — G4700 Insomnia, unspecified: Secondary | ICD-10-CM | POA: Diagnosis not present

## 2017-02-14 DIAGNOSIS — R6881 Early satiety: Secondary | ICD-10-CM | POA: Diagnosis not present

## 2017-02-14 DIAGNOSIS — D899 Disorder involving the immune mechanism, unspecified: Secondary | ICD-10-CM | POA: Diagnosis not present

## 2017-02-14 DIAGNOSIS — Z4824 Encounter for aftercare following lung transplant: Secondary | ICD-10-CM | POA: Diagnosis not present

## 2017-02-14 DIAGNOSIS — R760 Raised antibody titer: Secondary | ICD-10-CM | POA: Diagnosis not present

## 2017-02-14 DIAGNOSIS — Z992 Dependence on renal dialysis: Secondary | ICD-10-CM | POA: Diagnosis not present

## 2017-02-14 DIAGNOSIS — I953 Hypotension of hemodialysis: Secondary | ICD-10-CM | POA: Diagnosis not present

## 2017-02-14 DIAGNOSIS — Z942 Lung transplant status: Secondary | ICD-10-CM | POA: Diagnosis not present

## 2017-02-14 DIAGNOSIS — R531 Weakness: Secondary | ICD-10-CM | POA: Diagnosis not present

## 2017-02-14 DIAGNOSIS — R0609 Other forms of dyspnea: Secondary | ICD-10-CM | POA: Diagnosis not present

## 2017-02-14 DIAGNOSIS — R29898 Other symptoms and signs involving the musculoskeletal system: Secondary | ICD-10-CM | POA: Diagnosis not present

## 2017-02-14 DIAGNOSIS — N185 Chronic kidney disease, stage 5: Secondary | ICD-10-CM | POA: Diagnosis not present

## 2017-02-14 DIAGNOSIS — I120 Hypertensive chronic kidney disease with stage 5 chronic kidney disease or end stage renal disease: Secondary | ICD-10-CM | POA: Diagnosis not present

## 2017-02-14 DIAGNOSIS — R251 Tremor, unspecified: Secondary | ICD-10-CM | POA: Diagnosis not present

## 2017-02-14 DIAGNOSIS — Z8711 Personal history of peptic ulcer disease: Secondary | ICD-10-CM | POA: Diagnosis not present

## 2017-02-15 DIAGNOSIS — E1129 Type 2 diabetes mellitus with other diabetic kidney complication: Secondary | ICD-10-CM | POA: Diagnosis not present

## 2017-02-15 DIAGNOSIS — N186 End stage renal disease: Secondary | ICD-10-CM | POA: Diagnosis not present

## 2017-02-15 DIAGNOSIS — E876 Hypokalemia: Secondary | ICD-10-CM | POA: Diagnosis not present

## 2017-02-15 DIAGNOSIS — T8579XA Infection and inflammatory reaction due to other internal prosthetic devices, implants and grafts, initial encounter: Secondary | ICD-10-CM | POA: Diagnosis not present

## 2017-02-15 DIAGNOSIS — Z23 Encounter for immunization: Secondary | ICD-10-CM | POA: Diagnosis not present

## 2017-02-15 DIAGNOSIS — N2581 Secondary hyperparathyroidism of renal origin: Secondary | ICD-10-CM | POA: Diagnosis not present

## 2017-02-17 DIAGNOSIS — E1129 Type 2 diabetes mellitus with other diabetic kidney complication: Secondary | ICD-10-CM | POA: Diagnosis not present

## 2017-02-17 DIAGNOSIS — E876 Hypokalemia: Secondary | ICD-10-CM | POA: Diagnosis not present

## 2017-02-17 DIAGNOSIS — N2581 Secondary hyperparathyroidism of renal origin: Secondary | ICD-10-CM | POA: Diagnosis not present

## 2017-02-17 DIAGNOSIS — T8579XA Infection and inflammatory reaction due to other internal prosthetic devices, implants and grafts, initial encounter: Secondary | ICD-10-CM | POA: Diagnosis not present

## 2017-02-17 DIAGNOSIS — N186 End stage renal disease: Secondary | ICD-10-CM | POA: Diagnosis not present

## 2017-02-17 DIAGNOSIS — Z23 Encounter for immunization: Secondary | ICD-10-CM | POA: Diagnosis not present

## 2017-02-20 DIAGNOSIS — E876 Hypokalemia: Secondary | ICD-10-CM | POA: Diagnosis not present

## 2017-02-20 DIAGNOSIS — T8579XA Infection and inflammatory reaction due to other internal prosthetic devices, implants and grafts, initial encounter: Secondary | ICD-10-CM | POA: Diagnosis not present

## 2017-02-20 DIAGNOSIS — Z23 Encounter for immunization: Secondary | ICD-10-CM | POA: Diagnosis not present

## 2017-02-20 DIAGNOSIS — N2581 Secondary hyperparathyroidism of renal origin: Secondary | ICD-10-CM | POA: Diagnosis not present

## 2017-02-20 DIAGNOSIS — E1129 Type 2 diabetes mellitus with other diabetic kidney complication: Secondary | ICD-10-CM | POA: Diagnosis not present

## 2017-02-20 DIAGNOSIS — N186 End stage renal disease: Secondary | ICD-10-CM | POA: Diagnosis not present

## 2017-02-22 DIAGNOSIS — T8579XA Infection and inflammatory reaction due to other internal prosthetic devices, implants and grafts, initial encounter: Secondary | ICD-10-CM | POA: Diagnosis not present

## 2017-02-22 DIAGNOSIS — N2581 Secondary hyperparathyroidism of renal origin: Secondary | ICD-10-CM | POA: Diagnosis not present

## 2017-02-22 DIAGNOSIS — E1129 Type 2 diabetes mellitus with other diabetic kidney complication: Secondary | ICD-10-CM | POA: Diagnosis not present

## 2017-02-22 DIAGNOSIS — Z23 Encounter for immunization: Secondary | ICD-10-CM | POA: Diagnosis not present

## 2017-02-22 DIAGNOSIS — E876 Hypokalemia: Secondary | ICD-10-CM | POA: Diagnosis not present

## 2017-02-22 DIAGNOSIS — N186 End stage renal disease: Secondary | ICD-10-CM | POA: Diagnosis not present

## 2017-02-24 DIAGNOSIS — N2581 Secondary hyperparathyroidism of renal origin: Secondary | ICD-10-CM | POA: Diagnosis not present

## 2017-02-24 DIAGNOSIS — E1129 Type 2 diabetes mellitus with other diabetic kidney complication: Secondary | ICD-10-CM | POA: Diagnosis not present

## 2017-02-24 DIAGNOSIS — N186 End stage renal disease: Secondary | ICD-10-CM | POA: Diagnosis not present

## 2017-02-24 DIAGNOSIS — Z23 Encounter for immunization: Secondary | ICD-10-CM | POA: Diagnosis not present

## 2017-02-24 DIAGNOSIS — E876 Hypokalemia: Secondary | ICD-10-CM | POA: Diagnosis not present

## 2017-02-24 DIAGNOSIS — T8579XA Infection and inflammatory reaction due to other internal prosthetic devices, implants and grafts, initial encounter: Secondary | ICD-10-CM | POA: Diagnosis not present

## 2017-02-27 DIAGNOSIS — T8579XA Infection and inflammatory reaction due to other internal prosthetic devices, implants and grafts, initial encounter: Secondary | ICD-10-CM | POA: Diagnosis not present

## 2017-02-27 DIAGNOSIS — N2581 Secondary hyperparathyroidism of renal origin: Secondary | ICD-10-CM | POA: Diagnosis not present

## 2017-02-27 DIAGNOSIS — N186 End stage renal disease: Secondary | ICD-10-CM | POA: Diagnosis not present

## 2017-02-27 DIAGNOSIS — E1129 Type 2 diabetes mellitus with other diabetic kidney complication: Secondary | ICD-10-CM | POA: Diagnosis not present

## 2017-02-27 DIAGNOSIS — E876 Hypokalemia: Secondary | ICD-10-CM | POA: Diagnosis not present

## 2017-02-27 DIAGNOSIS — Z23 Encounter for immunization: Secondary | ICD-10-CM | POA: Diagnosis not present

## 2017-03-01 DIAGNOSIS — N2581 Secondary hyperparathyroidism of renal origin: Secondary | ICD-10-CM | POA: Diagnosis not present

## 2017-03-01 DIAGNOSIS — T8579XA Infection and inflammatory reaction due to other internal prosthetic devices, implants and grafts, initial encounter: Secondary | ICD-10-CM | POA: Diagnosis not present

## 2017-03-01 DIAGNOSIS — Z23 Encounter for immunization: Secondary | ICD-10-CM | POA: Diagnosis not present

## 2017-03-01 DIAGNOSIS — E1129 Type 2 diabetes mellitus with other diabetic kidney complication: Secondary | ICD-10-CM | POA: Diagnosis not present

## 2017-03-01 DIAGNOSIS — N186 End stage renal disease: Secondary | ICD-10-CM | POA: Diagnosis not present

## 2017-03-01 DIAGNOSIS — E876 Hypokalemia: Secondary | ICD-10-CM | POA: Diagnosis not present

## 2017-03-03 DIAGNOSIS — E1129 Type 2 diabetes mellitus with other diabetic kidney complication: Secondary | ICD-10-CM | POA: Diagnosis not present

## 2017-03-03 DIAGNOSIS — N2581 Secondary hyperparathyroidism of renal origin: Secondary | ICD-10-CM | POA: Diagnosis not present

## 2017-03-03 DIAGNOSIS — E876 Hypokalemia: Secondary | ICD-10-CM | POA: Diagnosis not present

## 2017-03-03 DIAGNOSIS — T8579XA Infection and inflammatory reaction due to other internal prosthetic devices, implants and grafts, initial encounter: Secondary | ICD-10-CM | POA: Diagnosis not present

## 2017-03-03 DIAGNOSIS — Z23 Encounter for immunization: Secondary | ICD-10-CM | POA: Diagnosis not present

## 2017-03-03 DIAGNOSIS — N186 End stage renal disease: Secondary | ICD-10-CM | POA: Diagnosis not present

## 2017-03-05 DIAGNOSIS — Z992 Dependence on renal dialysis: Secondary | ICD-10-CM | POA: Diagnosis not present

## 2017-03-05 DIAGNOSIS — N186 End stage renal disease: Secondary | ICD-10-CM | POA: Diagnosis not present

## 2017-03-05 DIAGNOSIS — T86819 Unspecified complication of lung transplant: Secondary | ICD-10-CM | POA: Diagnosis not present

## 2017-03-06 DIAGNOSIS — N186 End stage renal disease: Secondary | ICD-10-CM | POA: Diagnosis not present

## 2017-03-06 DIAGNOSIS — E876 Hypokalemia: Secondary | ICD-10-CM | POA: Diagnosis not present

## 2017-03-06 DIAGNOSIS — E1129 Type 2 diabetes mellitus with other diabetic kidney complication: Secondary | ICD-10-CM | POA: Diagnosis not present

## 2017-03-06 DIAGNOSIS — T8579XA Infection and inflammatory reaction due to other internal prosthetic devices, implants and grafts, initial encounter: Secondary | ICD-10-CM | POA: Diagnosis not present

## 2017-03-06 DIAGNOSIS — D631 Anemia in chronic kidney disease: Secondary | ICD-10-CM | POA: Diagnosis not present

## 2017-03-06 DIAGNOSIS — N2581 Secondary hyperparathyroidism of renal origin: Secondary | ICD-10-CM | POA: Diagnosis not present

## 2017-03-08 DIAGNOSIS — E1129 Type 2 diabetes mellitus with other diabetic kidney complication: Secondary | ICD-10-CM | POA: Diagnosis not present

## 2017-03-08 DIAGNOSIS — E876 Hypokalemia: Secondary | ICD-10-CM | POA: Diagnosis not present

## 2017-03-08 DIAGNOSIS — T8579XA Infection and inflammatory reaction due to other internal prosthetic devices, implants and grafts, initial encounter: Secondary | ICD-10-CM | POA: Diagnosis not present

## 2017-03-08 DIAGNOSIS — N2581 Secondary hyperparathyroidism of renal origin: Secondary | ICD-10-CM | POA: Diagnosis not present

## 2017-03-08 DIAGNOSIS — D631 Anemia in chronic kidney disease: Secondary | ICD-10-CM | POA: Diagnosis not present

## 2017-03-08 DIAGNOSIS — N186 End stage renal disease: Secondary | ICD-10-CM | POA: Diagnosis not present

## 2017-03-09 DIAGNOSIS — Z942 Lung transplant status: Secondary | ICD-10-CM | POA: Diagnosis not present

## 2017-03-09 DIAGNOSIS — Z418 Encounter for other procedures for purposes other than remedying health state: Secondary | ICD-10-CM | POA: Diagnosis not present

## 2017-03-09 DIAGNOSIS — N186 End stage renal disease: Secondary | ICD-10-CM | POA: Diagnosis not present

## 2017-03-09 DIAGNOSIS — D899 Disorder involving the immune mechanism, unspecified: Secondary | ICD-10-CM | POA: Diagnosis not present

## 2017-03-09 DIAGNOSIS — N189 Chronic kidney disease, unspecified: Secondary | ICD-10-CM | POA: Diagnosis not present

## 2017-03-09 DIAGNOSIS — Z79899 Other long term (current) drug therapy: Secondary | ICD-10-CM | POA: Diagnosis not present

## 2017-03-09 DIAGNOSIS — T86819 Unspecified complication of lung transplant: Secondary | ICD-10-CM | POA: Diagnosis not present

## 2017-03-10 DIAGNOSIS — T8579XA Infection and inflammatory reaction due to other internal prosthetic devices, implants and grafts, initial encounter: Secondary | ICD-10-CM | POA: Diagnosis not present

## 2017-03-10 DIAGNOSIS — E1129 Type 2 diabetes mellitus with other diabetic kidney complication: Secondary | ICD-10-CM | POA: Diagnosis not present

## 2017-03-10 DIAGNOSIS — N2581 Secondary hyperparathyroidism of renal origin: Secondary | ICD-10-CM | POA: Diagnosis not present

## 2017-03-10 DIAGNOSIS — E876 Hypokalemia: Secondary | ICD-10-CM | POA: Diagnosis not present

## 2017-03-10 DIAGNOSIS — N186 End stage renal disease: Secondary | ICD-10-CM | POA: Diagnosis not present

## 2017-03-10 DIAGNOSIS — D631 Anemia in chronic kidney disease: Secondary | ICD-10-CM | POA: Diagnosis not present

## 2017-03-13 ENCOUNTER — Encounter (HOSPITAL_COMMUNITY): Payer: Self-pay

## 2017-03-13 ENCOUNTER — Encounter (HOSPITAL_COMMUNITY)
Admission: RE | Admit: 2017-03-13 | Discharge: 2017-03-13 | Disposition: A | Discharge status: Home / Self Care | Payer: Medicare Other | Source: Ambulatory Visit | Attending: Pulmonary Disease | Admitting: Pulmonary Disease

## 2017-03-13 VITALS — BP 114/69 | HR 73 | Resp 18 | Ht 63.5 in | Wt 143.5 lb

## 2017-03-13 DIAGNOSIS — T8579XA Infection and inflammatory reaction due to other internal prosthetic devices, implants and grafts, initial encounter: Secondary | ICD-10-CM | POA: Diagnosis not present

## 2017-03-13 DIAGNOSIS — G4733 Obstructive sleep apnea (adult) (pediatric): Secondary | ICD-10-CM | POA: Diagnosis not present

## 2017-03-13 DIAGNOSIS — Z87891 Personal history of nicotine dependence: Secondary | ICD-10-CM | POA: Diagnosis not present

## 2017-03-13 DIAGNOSIS — N2581 Secondary hyperparathyroidism of renal origin: Secondary | ICD-10-CM | POA: Diagnosis not present

## 2017-03-13 DIAGNOSIS — Z942 Lung transplant status: Secondary | ICD-10-CM | POA: Insufficient documentation

## 2017-03-13 DIAGNOSIS — Z992 Dependence on renal dialysis: Secondary | ICD-10-CM | POA: Insufficient documentation

## 2017-03-13 DIAGNOSIS — Z7952 Long term (current) use of systemic steroids: Secondary | ICD-10-CM | POA: Insufficient documentation

## 2017-03-13 DIAGNOSIS — E1129 Type 2 diabetes mellitus with other diabetic kidney complication: Secondary | ICD-10-CM | POA: Diagnosis not present

## 2017-03-13 DIAGNOSIS — Z794 Long term (current) use of insulin: Secondary | ICD-10-CM | POA: Diagnosis not present

## 2017-03-13 DIAGNOSIS — E876 Hypokalemia: Secondary | ICD-10-CM | POA: Diagnosis not present

## 2017-03-13 DIAGNOSIS — K219 Gastro-esophageal reflux disease without esophagitis: Secondary | ICD-10-CM | POA: Insufficient documentation

## 2017-03-13 DIAGNOSIS — Z79899 Other long term (current) drug therapy: Secondary | ICD-10-CM | POA: Insufficient documentation

## 2017-03-13 DIAGNOSIS — E1122 Type 2 diabetes mellitus with diabetic chronic kidney disease: Secondary | ICD-10-CM | POA: Diagnosis not present

## 2017-03-13 DIAGNOSIS — I120 Hypertensive chronic kidney disease with stage 5 chronic kidney disease or end stage renal disease: Secondary | ICD-10-CM | POA: Insufficient documentation

## 2017-03-13 DIAGNOSIS — Z7982 Long term (current) use of aspirin: Secondary | ICD-10-CM | POA: Diagnosis not present

## 2017-03-13 DIAGNOSIS — N186 End stage renal disease: Secondary | ICD-10-CM | POA: Diagnosis not present

## 2017-03-13 DIAGNOSIS — D631 Anemia in chronic kidney disease: Secondary | ICD-10-CM | POA: Diagnosis not present

## 2017-03-13 NOTE — Progress Notes (Signed)
[name] 66 y.o. male Pulmonary Rehab Orientation Note Patient arrived today in Cardiac and Pulmonary Rehab for orientation to Pulmonary Rehab. He was transported from [employer] via wheel chair. He does not use oxygen since his lung transplant. Color good, skin warm and dry. Patient is oriented to time and place. Patient's medical history, psychosocial health, and medications reviewed. Psychosocial assessment reveals pt lives with their spouse. Pt is currently retired. Pt hobbies include gardening however he is no longer able to do this related to restrictions post transplant. He also loves singing and now that he is no longer breathless he is able to sing in the choir at church. Pt reports his stress level is low. Areas of stress/anxiety include Health, specifically dialysis on M,W, and F. It is very time consuming for he and his wife. He is not yet driving and has guilt that she is neglecting things she wants to do because of all of his medical appointments.  Pt does not  exhibit signs of depression. PHQ2/9 score 0/0. Pt shows good  coping skills with positive outlook. He is offered emotional support and reassurance. Will continue to monitor and evaluate progress toward psychosocial goal(s) of remaining positive that he will regain some independence with daily home chores. Physical assessment reveals heart rate is normal, breath sounds clear to auscultation, no wheezes, rales, or rhonchi in the right. Clear upper left with fine crackles mid to lower left. Grip strength equal, strong. Distal pulses palpable. Non-pitting edema noted to lower legs and ankles. L>R. Patient scheduled for dialysis today at 11 to have fluid pulled off. Patient reports he  does take medications as prescribed. Patient states he follows a very restricted diabetic, transplant, renal failure  diet. The patient reports no specific efforts to gain or lose weight.. Patient's weight will be monitored closely. Demonstration and  practice of PLB using pulse oximeter. Patient able to return demonstration satisfactorily. Safety and hand hygiene in the exercise area reviewed with patient. Patient voices understanding of the information reviewed. Department expectations discussed with patient and achievable goals were set. The patient shows enthusiasm about attending the program and we look forward to working with this nice gentleman. The patient is scheduled for a 6 min walk test on 03/14/17 and to begin exercise on 03/21/17 at 1030.   45 minutes was spent on a variety of activities such as assessment of the patient, obtaining baseline data including height, weight, BMI, and grip strength, verifying medical history, allergies, and current medications, and teaching patient strategies for performing tasks with less respiratory effort with emphasis on pursed lip breathing.

## 2017-03-14 ENCOUNTER — Encounter (HOSPITAL_COMMUNITY)
Admission: RE | Admit: 2017-03-14 | Discharge: 2017-03-14 | Disposition: A | Discharge status: Home / Self Care | Payer: Medicare Other | Source: Ambulatory Visit | Attending: Pulmonary Disease | Admitting: Pulmonary Disease

## 2017-03-14 ENCOUNTER — Encounter (HOSPITAL_COMMUNITY): Payer: Self-pay | Admitting: *Deleted

## 2017-03-14 DIAGNOSIS — Z7952 Long term (current) use of systemic steroids: Secondary | ICD-10-CM | POA: Diagnosis not present

## 2017-03-14 DIAGNOSIS — Z942 Lung transplant status: Secondary | ICD-10-CM

## 2017-03-14 DIAGNOSIS — Z794 Long term (current) use of insulin: Secondary | ICD-10-CM | POA: Diagnosis not present

## 2017-03-14 DIAGNOSIS — Z7982 Long term (current) use of aspirin: Secondary | ICD-10-CM | POA: Diagnosis not present

## 2017-03-14 DIAGNOSIS — K219 Gastro-esophageal reflux disease without esophagitis: Secondary | ICD-10-CM | POA: Diagnosis not present

## 2017-03-14 DIAGNOSIS — Z79899 Other long term (current) drug therapy: Secondary | ICD-10-CM | POA: Diagnosis not present

## 2017-03-15 DIAGNOSIS — T8579XA Infection and inflammatory reaction due to other internal prosthetic devices, implants and grafts, initial encounter: Secondary | ICD-10-CM | POA: Diagnosis not present

## 2017-03-15 DIAGNOSIS — N186 End stage renal disease: Secondary | ICD-10-CM | POA: Diagnosis not present

## 2017-03-15 DIAGNOSIS — N2581 Secondary hyperparathyroidism of renal origin: Secondary | ICD-10-CM | POA: Diagnosis not present

## 2017-03-15 DIAGNOSIS — E876 Hypokalemia: Secondary | ICD-10-CM | POA: Diagnosis not present

## 2017-03-15 DIAGNOSIS — E1129 Type 2 diabetes mellitus with other diabetic kidney complication: Secondary | ICD-10-CM | POA: Diagnosis not present

## 2017-03-15 DIAGNOSIS — D631 Anemia in chronic kidney disease: Secondary | ICD-10-CM | POA: Diagnosis not present

## 2017-03-16 NOTE — Progress Notes (Signed)
[name] 66 y.o. male  DOB: 18-Dec-1950 MRN: [id number]           Nutrition Brief Note 1. Status post lung transplantation [facility])    Past Medical History:  Diagnosis Date  . Aortic valve disorders   . Benign neoplasm of colon   . Degeneration of intervertebral disc, site unspecified   . Diabetes mellitus without complication ([place])   . 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 Esophageal reflux   . ESRD (end stage renal disease) on dialysis ([place])   . Essential hypertension   . Obstructive sleep apnea (adult) (pediatric)   . Osteoarthrosis, unspecified whether generalized or localized, unspecified site   . Other and unspecified hyperlipidemia   . Pneumonia    interstitial pneumonia  . Pulmonary fibrosis ([place])   . Renal disorder   . Respiratory failure with hypoxia ([place]) 12/2015  . Shortness of breath dyspnea   . Transplanted, lung ([place])   . Unspecified essential hypertension    Meds reviewed. Phoslo, Novolin R, Deltasone noted  Ht: Ht Readings from Last 1 Encounters:  03/13/17 5' 3.5" (1.613 m)    Wt:  Wt Readings from Last 3 Encounters:  03/13/17 143 lb 8.3 oz (65.1 kg)  06/24/16 123 lb (55.8 kg)  01/01/16 122 lb 2.2 oz (55.4 kg)    BMI: 25.0 Note: pt wt is down 31 lb over the past 2 years. Per previous RD note, pt was trying to lose wt.     Current tobacco use? No  Labs:  Lipid Panel  No results found for: CHOL, TRIG, HDL, CHOLHDL, VLDL, LDLCALC, LDLDIRECT 11/15/2016 Hemoglobin A1c 5.7 11/15/2016 Total Chol 161   Triglyceride 187   HDL  61   LDL  63   Nutrition Diagnosis ? Food-and nutrition-related knowledge deficit related to lack of exposure to information as related to diagnosis of pulmonary disease  Goal(s) 1. To be determined with pt  Plan:  Pt to attend Pulmonary Nutrition class Will provide client-centered nutrition education as part of interdisciplinary care.   Monitor and evaluate progress toward nutrition goal with  team.  Monitor and Evaluate progress toward nutrition goal with team.   Derek Mound, M.Ed, RD, LDN, CDE 03/16/2017 2:44 PM

## 2017-03-16 NOTE — Progress Notes (Signed)
Pulmonary Individual Treatment Plan  Patient Details  Name: [name] MRN: [id number] Date of Birth: 1950-11-11 Referring Provider:     Pulmonary Rehab Walk Test from 03/14/2017 in [place]  Referring Provider  Dr. Nelda Marseille      Initial Encounter Date:    Pulmonary Rehab Walk Test from 03/14/2017 in [place]  Date  03/16/17  Referring Provider  Dr. Nelda Marseille      Visit Diagnosis: Status post lung transplantation [facility])  Patient's Home Medications on Admission:   Current Outpatient Prescriptions:  .  ACCU-CHEK FASTCLIX LANCETS MISC, As directed up to 4 times daily, Disp: , Rfl: 3 .  acetaminophen (TYLENOL) 500 MG tablet, Take 500-1,000 mg by mouth every 6 (six) hours as needed for headache., Disp: , Rfl:  .  aspirin 81 MG tablet, Take 81 mg by mouth daily. , Disp: , Rfl:  .  azaTHIOprine (IMURAN) 50 MG tablet, Take 1/2 tablet (25 mg) every Monday, Wednesday and Friday after HD., Disp: , Rfl:  .  calcium acetate (PHOSLO) 667 MG capsule, Take 1,334 mg by mouth 3 (three) times daily before meals. , Disp: , Rfl:  .  cycloSPORINE modified (NEORAL) 25 MG capsule, Take 75 mg by mouth 2 (two) times daily. , Disp: , Rfl:  .  cycloSPORINE modified (NEORAL) 25 MG capsule, Take by mouth., Disp: , Rfl:  .  doxercalciferol (HECTOROL) 4 MCG/2ML injection, Inject into the vein., Disp: , Rfl:  .  fluticasone (FLONASE) 50 MCG/ACT nasal spray, Place 2 sprays into the nose daily. (Patient taking differently: Place 2 sprays into the nose daily as needed for allergies. ), Disp: 30 g, Rfl: 2 .  hydrOXYzine (ATARAX/VISTARIL) 25 MG tablet, Take by mouth., Disp: , Rfl:  .  insulin regular (NOVOLIN R) 100 units/mL injection, Inject 5 Units into the skin 3 (three) times daily before meals. Additional units (per sliding scale): BGL 201-250 = 1 unit; 251-300 = 2 units; 301-350 = 3 units; 351-400 = units; >401 = 5 units + CALL LUNG COORDINATOR @  [facility], Disp: , Rfl:  .  insulin regular (NOVOLIN R,HUMULIN R) 100 units/mL injection, 201 - 250 mg/dL 1 units, 251 - 300 mg/dL 2 units, 301 - 350 mg/dL 3 units, >350 mg/dL Notify Provider, Disp: , Rfl:  .  latanoprost (XALATAN) 0.005 % ophthalmic solution, Place 1 drop into both eyes at bedtime., Disp: , Rfl:  .  metoCLOPramide (REGLAN) 5 MG tablet, Take by mouth., Disp: , Rfl:  .  midodrine (PROAMATINE) 10 MG tablet, Take by mouth., Disp: , Rfl:  .  Multiple Vitamin (MULTIVITAMIN WITH MINERALS) TABS tablet, Take 1 tablet by mouth daily., Disp: , Rfl:  .  omeprazole (PRILOSEC) 20 MG capsule, Take by mouth., Disp: , Rfl:  .  ondansetron (ZOFRAN) 4 MG tablet, Take 4 mg by mouth every 8 (eight) hours as needed for nausea or vomiting. , Disp: , Rfl:  .  polyvinyl alcohol-povidone (REFRESH) 1.4-0.6 % ophthalmic solution, Place 1-2 drops into both eyes daily as needed (for dryness)., Disp: , Rfl:  .  pravastatin (PRAVACHOL) 20 MG tablet, Take 20 mg by mouth every evening. , Disp: , Rfl:  .  predniSONE (DELTASONE) 5 MG tablet, Take by mouth., Disp: , Rfl:  .  sertraline (ZOLOFT) 50 MG tablet, Take 50 mg by mouth daily., Disp: , Rfl:  .  sulfamethoxazole-trimethoprim (BACTRIM,SEPTRA) 400-80 MG tablet, Take 1 tablet by mouth 3 (three) times a week. Monday,  Wednesday, Friday, Disp: , Rfl:  .  valGANciclovir (VALCYTE) 450 MG tablet, Take 1 tablet by mouth See admin instructions. Every Monday and Friday AFTER DIALYSIS, Disp: , Rfl:   Past Medical History: Past Medical History:  Diagnosis Date  . Aortic valve disorders   . Benign neoplasm of colon   . Degeneration of intervertebral disc, site unspecified   . Diabetes mellitus without complication ([place])   . 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 Esophageal reflux   . ESRD (end stage renal disease) on dialysis ([place])   . Essential hypertension   . Obstructive sleep apnea (adult) (pediatric)   . Osteoarthrosis, unspecified  whether generalized or localized, unspecified site   . Other and unspecified hyperlipidemia   . Pneumonia    interstitial pneumonia  . Pulmonary fibrosis ([place])   . Renal disorder   . Respiratory failure with hypoxia ([place]) 12/2015  . Shortness of breath dyspnea   . Transplanted, lung ([place])   . Unspecified essential hypertension     Tobacco Use: History  Smoking Status  . Former Smoker  . Packs/day: 0.30  . Years: 20.00  . Types: Cigarettes  . Quit date: 06/06/2002  Smokeless Tobacco  . Never Used    Labs: Recent Review Flowsheet Data    Labs for ITP Cardiac and Pulmonary Rehab Latest Ref Rng & Units 04/19/2015 12/30/2015 01/01/2016 01/01/2016 01/01/2016   Hemoglobin A1c <5.7 % - - - - -   PHART 7.350 - 7.450 7.299(L) 7.368 7.458(H) 7.393 7.381   PCO2ART 35.0 - 45.0 mmHg 40.5 39.6 37.2 45.2(H) 42.4   HCO3 20.0 - 24.0 mEq/L 19.3(L) 22.7 26.0(H) 27.1(H) 24.7(H)   TCO2 0 - 100 mmol/L 20.5 24 27.1 28 26    ACIDBASEDEF 0.0 - 2.0 mmol/L 6.0(H) 2.0 - - -   O2SAT % - 91.0 91.1 100.0 94.0      Capillary Blood Glucose: Lab Results  Component Value Date   GLUCAP 122 (H) 01/01/2016   GLUCAP 82 01/01/2016   GLUCAP 83 01/01/2016   GLUCAP 82 01/01/2016   GLUCAP 107 (H) 12/31/2015     Pulmonary Assessment Scores:     Pulmonary Assessment Scores    Row Name 03/14/17 1354 03/16/17 0724       ADL UCSD   ADL Phase Entry Entry    SOB Score total 30  -      CAT Score   CAT Score 8  Entry  -      mMRC Score   mMRC Score  - 1       Pulmonary Function Assessment:     Pulmonary Function Assessment - 03/13/17 0941      Breath   Bilateral Breath Sounds Other   Other right lung clear but deminished throughout, left upper clear, fine crackles mid to lower base   Shortness of Breath No      Exercise Target Goals: Date: 03/16/17  Exercise Program Goal: Individual exercise prescription set with THRR, safety & activity barriers. Participant demonstrates ability to understand  and report RPE using BORG scale, to self-measure pulse accurately, and to acknowledge the importance of the exercise prescription.  Exercise Prescription Goal: Starting with aerobic activity 30 plus minutes a day, 3 days per week for initial exercise prescription. Provide home exercise prescription and guidelines that participant acknowledges understanding prior to discharge.  Activity Barriers & Risk Stratification:     Activity Barriers & Cardiac Risk Stratification - 03/13/17 0921      Activity  Barriers & Cardiac Risk Stratification   Activity Barriers Deconditioning;Balance Concerns      6 Minute Walk:     6 Minute Walk    Row Name 03/16/17 0716         6 Minute Walk   Phase Initial     Distance 1000 feet     Walk Time 6 minutes     # of Rest Breaks 0     MPH 1.89     METS 2.45     RPE 12     Perceived Dyspnea  0     Symptoms Yes (comment)     Comments 5/10 calf pain     Resting HR 77 bpm     Resting BP 105/62     Resting Oxygen Saturation  93 %     Exercise Oxygen Saturation  during 6 min walk 88 %     Max Ex. HR 91 bpm     Max Ex. BP 182/79     2 Minute Post BP 173/82  159/80       Interval HR   1 Minute HR 77     2 Minute HR 82     3 Minute HR 84     4 Minute HR 89     5 Minute HR 90     6 Minute HR 91     Interval Heart Rate? Yes       Interval Oxygen   Interval Oxygen? Yes     Baseline Oxygen Saturation % 93 %     1 Minute Oxygen Saturation % 93 %     1 Minute Liters of Oxygen 0 L     2 Minute Oxygen Saturation % 89 %     2 Minute Liters of Oxygen 0 L     3 Minute Oxygen Saturation % 89 %     3 Minute Liters of Oxygen 0 L     4 Minute Oxygen Saturation % 90 %     4 Minute Liters of Oxygen 0 L     5 Minute Oxygen Saturation % 88 %     5 Minute Liters of Oxygen 0 L     6 Minute Oxygen Saturation % 90 %     6 Minute Liters of Oxygen 0 L        Oxygen Initial Assessment:     Oxygen Initial Assessment - 03/16/17 0724      Initial 6 min  Walk   Oxygen Used None     Program Oxygen Prescription   Program Oxygen Prescription None      Oxygen Re-Evaluation:   Oxygen Discharge (Final Oxygen Re-Evaluation):   Initial Exercise Prescription:     Initial Exercise Prescription - 03/16/17 0700      Date of Initial Exercise RX and Referring Provider   Date 03/16/17   Referring Provider Dr. Nelda Marseille     Bike   Level 0.4   Minutes 17     NuStep   Level 2   Minutes 17   METs 1.4     Track   Laps 8   Minutes 17     Prescription Details   Frequency (times per week) 2   Duration Progress to 45 minutes of aerobic exercise without signs/symptoms of physical distress     Intensity   THRR 40-80% of Max Heartrate 61-122   Ratings of Perceived Exertion 11-13   Perceived Dyspnea 0-4     Progression  Progression Continue progressive overload as per policy without signs/symptoms or physical distress.     Resistance Training   Training Prescription Yes   Weight orange bands   Reps 10-15      Perform Capillary Blood Glucose checks as needed.  Exercise Prescription Changes:   Exercise Comments:   Exercise Goals and Review:     Exercise Goals    Row Name 03/13/17 0921             Exercise Goals   Increase Physical Activity Yes       Intervention Provide advice, education, support and counseling about physical activity/exercise needs.;Develop an individualized exercise prescription for aerobic and resistive training based on initial evaluation findings, risk stratification, comorbidities and participant's personal goals.       Expected Outcomes Achievement of increased cardiorespiratory fitness and enhanced flexibility, muscular endurance and strength shown through measurements of functional capacity and personal statement of participant.       Increase Strength and Stamina Yes       Intervention Provide advice, education, support and counseling about physical activity/exercise needs.;Develop an  individualized exercise prescription for aerobic and resistive training based on initial evaluation findings, risk stratification, comorbidities and participant's personal goals.       Expected Outcomes Achievement of increased cardiorespiratory fitness and enhanced flexibility, muscular endurance and strength shown through measurements of functional capacity and personal statement of participant.       Able to understand and use rate of perceived exertion (RPE) scale Yes       Intervention Provide education and explanation on how to use RPE scale       Expected Outcomes Short Term: Able to use RPE daily in rehab to express subjective intensity level;Long Term:  Able to use RPE to guide intensity level when exercising independently       Able to understand and use Dyspnea scale Yes       Intervention Provide education and explanation on how to use Dyspnea scale       Expected Outcomes Short Term: Able to use Dyspnea scale daily in rehab to express subjective sense of shortness of breath during exertion;Long Term: Able to use Dyspnea scale to guide intensity level when exercising independently       Knowledge and understanding of Target Heart Rate Range (THRR) Yes       Intervention Provide education and explanation of THRR including how the numbers were predicted and where they are located for reference       Expected Outcomes Short Term: Able to state/look up THRR;Long Term: Able to use THRR to govern intensity when exercising independently;Short Term: Able to use daily as guideline for intensity in rehab       Understanding of Exercise Prescription Yes       Intervention Provide education, explanation, and written materials on patient's individual exercise prescription       Expected Outcomes Short Term: Able to explain program exercise prescription;Long Term: Able to explain home exercise prescription to exercise independently          Exercise Goals Re-Evaluation :   Discharge Exercise  Prescription (Final Exercise Prescription Changes):   Nutrition:  Target Goals: Understanding of nutrition guidelines, daily intake of sodium 1500mg , cholesterol 200mg , calories 30% from fat and 7% or less from saturated fats, daily to have 5 or more servings of fruits and vegetables.  Biometrics:     Pre Biometrics - 03/13/17 0922      Pre Biometrics   Grip  Strength 20 kg       Nutrition Therapy Plan and Nutrition Goals:   Nutrition Discharge: Rate Your Plate Scores:   Nutrition Goals Re-Evaluation:   Nutrition Goals Discharge (Final Nutrition Goals Re-Evaluation):   Psychosocial: Target Goals: Acknowledge presence or absence of significant depression and/or stress, maximize coping skills, provide positive support system. Participant is able to verbalize types and ability to use techniques and skills needed for reducing stress and depression.  Initial Review & Psychosocial Screening:     Initial Psych Review & Screening - 03/13/17 0942      Initial Review   Current issues with None Identified     Family Dynamics   Good Support System? Yes     Barriers   Psychosocial barriers to participate in program There are no identifiable barriers or psychosocial needs.     Screening Interventions   Interventions Encouraged to exercise      Quality of Life Scores:   PHQ-9: Recent Review Flowsheet Data    Depression screen [facility] 2/9 03/13/2017 08/21/2015 07/22/2014 05/09/2014 12/03/2012   Decreased Interest 0 0 0 0 0   Down, Depressed, Hopeless 0 0 0 0 0   PHQ - 2 Score 0 0 0 0 0     Interpretation of Total Score  Total Score Depression Severity:  1-4 = Minimal depression, 5-9 = Mild depression, 10-14 = Moderate depression, 15-19 = Moderately severe depression, 20-27 = Severe depression   Psychosocial Evaluation and Intervention:     Psychosocial Evaluation - 03/13/17 0943      Psychosocial Evaluation & Interventions   Interventions Encouraged to exercise with  the program and follow exercise prescription   Expected Outcomes patient will remain free from psysocial barriers to participate in pulmonary rehab   Continue Psychosocial Services  No Follow up required      Psychosocial Re-Evaluation:   Psychosocial Discharge (Final Psychosocial Re-Evaluation):   Education: Education Goals: Education classes will be provided on a weekly basis, covering required topics. Participant will state understanding/return demonstration of topics presented.  Learning Barriers/Preferences:     Learning Barriers/Preferences - 03/13/17 0940      Learning Barriers/Preferences   Learning Barriers None   Learning Preferences Individual Instruction;Skilled Demonstration;Written Material;Verbal Instruction      Education Topics: Risk Factor Reduction:  -Group instruction that is supported by a PowerPoint presentation. Instructor discusses the definition of a risk factor, different risk factors for pulmonary disease, and how the heart and lungs work together.     Nutrition for Pulmonary Patient:  -Group instruction provided by PowerPoint slides, verbal discussion, and written materials to support subject matter. The instructor gives an explanation and review of healthy diet recommendations, which includes a discussion on weight management, recommendations for fruit and vegetable consumption, as well as protein, fluid, caffeine, fiber, sodium, sugar, and alcohol. Tips for eating when patients are short of breath are discussed.   Pursed Lip Breathing:  -Group instruction that is supported by demonstration and informational handouts. Instructor discusses the benefits of pursed lip and diaphragmatic breathing and detailed demonstration on how to preform both.     Oxygen Safety:  -Group instruction provided by PowerPoint, verbal discussion, and written material to support subject matter. There is an overview of "What is Oxygen" and "Why do we need it".  Instructor  also reviews how to create a safe environment for oxygen use, the importance of using oxygen as prescribed, and the risks of noncompliance. There is a brief discussion on traveling with oxygen and  resources the patient may utilize.   Oxygen Equipment:  -Group instruction provided by [facility] Staff utilizing handouts, written materials, and equipment demonstrations.   Signs and Symptoms:  -Group instruction provided by written material and verbal discussion to support subject matter. Warning signs and symptoms of infection, stroke, and heart attack are reviewed and when to call the physician/911 reinforced. Tips for preventing the spread of infection discussed.   Advanced Directives:  -Group instruction provided by verbal instruction and written material to support subject matter. Instructor reviews Advanced Directive laws and proper instruction for filling out document.   Pulmonary Video:  -Group video education that reviews the importance of medication and oxygen compliance, exercise, good nutrition, pulmonary hygiene, and pursed lip and diaphragmatic breathing for the pulmonary patient.   Exercise for the Pulmonary Patient:  -Group instruction that is supported by a PowerPoint presentation. Instructor discusses benefits of exercise, core components of exercise, frequency, duration, and intensity of an exercise routine, importance of utilizing pulse oximetry during exercise, safety while exercising, and options of places to exercise outside of rehab.     Pulmonary Medications:  -Verbally interactive group education provided by instructor with focus on inhaled medications and proper administration.   Anatomy and Physiology of the Respiratory System and Intimacy:  -Group instruction provided by PowerPoint, verbal discussion, and written material to support subject matter. Instructor reviews respiratory cycle and anatomical components of the respiratory system and their functions.  Instructor also reviews differences in obstructive and restrictive respiratory diseases with examples of each. Intimacy, Sex, and Sexuality differences are reviewed with a discussion on how relationships can change when diagnosed with pulmonary disease. Common sexual concerns are reviewed.   MD DAY -A group question and answer session with a medical doctor that allows participants to ask questions that relate to their pulmonary disease state.   OTHER EDUCATION -Group or individual verbal, written, or video instructions that support the educational goals of the pulmonary rehab program.   Knowledge Questionnaire Score:     Knowledge Questionnaire Score - 03/14/17 1353      Knowledge Questionnaire Score   Pre Score 9/13      Core Components/Risk Factors/Patient Goals at Admission:     Personal Goals and Risk Factors at Admission - 03/13/17 0942      Core Components/Risk Factors/Patient Goals on Admission   Improve shortness of breath with ADL's Yes   Intervention Provide education, individualized exercise plan and daily activity instruction to help decrease symptoms of SOB with activities of daily living.   Expected Outcomes Short Term: Achieves a reduction of symptoms when performing activities of daily living.      Core Components/Risk Factors/Patient Goals Review:    Core Components/Risk Factors/Patient Goals at Discharge (Final Review):    ITP Comments:   Comments:

## 2017-03-17 DIAGNOSIS — T8579XA Infection and inflammatory reaction due to other internal prosthetic devices, implants and grafts, initial encounter: Secondary | ICD-10-CM | POA: Diagnosis not present

## 2017-03-17 DIAGNOSIS — N2581 Secondary hyperparathyroidism of renal origin: Secondary | ICD-10-CM | POA: Diagnosis not present

## 2017-03-17 DIAGNOSIS — E876 Hypokalemia: Secondary | ICD-10-CM | POA: Diagnosis not present

## 2017-03-17 DIAGNOSIS — E1129 Type 2 diabetes mellitus with other diabetic kidney complication: Secondary | ICD-10-CM | POA: Diagnosis not present

## 2017-03-17 DIAGNOSIS — N186 End stage renal disease: Secondary | ICD-10-CM | POA: Diagnosis not present

## 2017-03-17 DIAGNOSIS — D631 Anemia in chronic kidney disease: Secondary | ICD-10-CM | POA: Diagnosis not present

## 2017-03-20 DIAGNOSIS — N186 End stage renal disease: Secondary | ICD-10-CM | POA: Diagnosis not present

## 2017-03-20 DIAGNOSIS — N2581 Secondary hyperparathyroidism of renal origin: Secondary | ICD-10-CM | POA: Diagnosis not present

## 2017-03-20 DIAGNOSIS — D631 Anemia in chronic kidney disease: Secondary | ICD-10-CM | POA: Diagnosis not present

## 2017-03-20 DIAGNOSIS — E1129 Type 2 diabetes mellitus with other diabetic kidney complication: Secondary | ICD-10-CM | POA: Diagnosis not present

## 2017-03-20 DIAGNOSIS — E876 Hypokalemia: Secondary | ICD-10-CM | POA: Diagnosis not present

## 2017-03-20 DIAGNOSIS — T8579XA Infection and inflammatory reaction due to other internal prosthetic devices, implants and grafts, initial encounter: Secondary | ICD-10-CM | POA: Diagnosis not present

## 2017-03-21 ENCOUNTER — Encounter (HOSPITAL_COMMUNITY)
Admission: RE | Admit: 2017-03-21 | Discharge: 2017-03-21 | Disposition: A | Discharge status: Home / Self Care | Payer: Medicare Other | Source: Ambulatory Visit | Attending: Pulmonary Disease | Admitting: Pulmonary Disease

## 2017-03-21 VITALS — Wt 139.6 lb

## 2017-03-21 DIAGNOSIS — Z79899 Other long term (current) drug therapy: Secondary | ICD-10-CM | POA: Diagnosis not present

## 2017-03-21 DIAGNOSIS — Z794 Long term (current) use of insulin: Secondary | ICD-10-CM | POA: Diagnosis not present

## 2017-03-21 DIAGNOSIS — Z942 Lung transplant status: Secondary | ICD-10-CM

## 2017-03-21 DIAGNOSIS — Z7952 Long term (current) use of systemic steroids: Secondary | ICD-10-CM | POA: Diagnosis not present

## 2017-03-21 DIAGNOSIS — K219 Gastro-esophageal reflux disease without esophagitis: Secondary | ICD-10-CM | POA: Diagnosis not present

## 2017-03-21 DIAGNOSIS — Z7982 Long term (current) use of aspirin: Secondary | ICD-10-CM | POA: Diagnosis not present

## 2017-03-21 NOTE — Progress Notes (Signed)
Daily Session Note  Patient Details  Name: JERRYL HOLZHAUER MRN: [id number] Date of Birth: 1950-09-06 Referring Provider:     Pulmonary Rehab Walk Test from 03/14/2017 in [place]  Referring Provider  Dr. Nelda Marseille      Encounter Date: 03/21/2017  Check In:     Session Check In - 03/21/17 1021      Check-In   Location [facility]   Staff Present Su Hilt, MS, ACSM RCEP, Exercise Physiologist;Lisa Ysidro Evert, RN;Kevon Tench Rollene Rotunda, RN, BSN   Supervising physician immediately available to respond to emergencies [facility] immediately available   Physician(s) Dr. Verlon Au   Medication changes reported     No   Fall or balance concerns reported    No   Tobacco Cessation No Change   Warm-up and Cool-down Performed as group-led instruction   Resistance Training Performed Yes   VAD Patient? No     Pain Assessment   Currently in Pain? No/denies   Multiple Pain Sites No      Capillary Blood Glucose: No results found for this or any previous visit (from the past 24 hour(s)).     POCT Glucose - 03/21/17 1226      POCT Blood Glucose   Pre-Exercise 143 mg/dL   Post-Exercise 75 mg/dL   Post-Exercise #2 68 mg/dL   Post-Exercise #3 88 mg/dL         Exercise Prescription Changes - 03/21/17 1228      Response to Exercise   Blood Pressure (Admit) 112/72   Blood Pressure (Exercise) 132/88   Blood Pressure (Exit) 100/66   Heart Rate (Admit) 74 bpm   Heart Rate (Exercise) 80 bpm   Heart Rate (Exit) 76 bpm   Oxygen Saturation (Admit) 95 %   Oxygen Saturation (Exercise) 88 %   Oxygen Saturation (Exit) 76 %   Rating of Perceived Exertion (Exercise) 12   Perceived Dyspnea (Exercise) 0   Duration Progress to 45 minutes of aerobic exercise without signs/symptoms of physical distress   Intensity Other (comment)  40-80% HRR     Progression   Progression Continue to progress workloads to maintain intensity without  signs/symptoms of physical distress.     Resistance Training   Training Prescription Yes   Weight orange bands   Reps 10-15     Oxygen   Oxygen --  Room air     Bike   Level 0.4   Minutes 17     NuStep   Level 2   Minutes 17   METs 1.4      History  Smoking Status  . Former Smoker  . Packs/day: 0.30  . Years: 20.00  . Types: Cigarettes  . Quit date: 06/06/2002  Smokeless Tobacco  . Never Used    Goals Met:  Using PLB without cueing & demonstrates good technique Exercise tolerated well No report of cardiac concerns or symptoms Strength training completed today  Goals Unmet:  blood glucose level dropped after the 2nd station (105mn aerobic exercise). Patient was given 4 oz gingerale and jollyrancher. Patient instructed to have a snack per protocol on Thursday just prior to exercise. If patient continues to have hypoglycemia will contact md and discuss potential for decreasing am insulin on pulmonary rehab mornings.  Comments: Service time is from 1030 to 1210   Dr. WRush Farmeris Medical Director [facility] Pulmonary Re[facility]

## 2017-03-22 DIAGNOSIS — N186 End stage renal disease: Secondary | ICD-10-CM | POA: Diagnosis not present

## 2017-03-22 DIAGNOSIS — N2581 Secondary hyperparathyroidism of renal origin: Secondary | ICD-10-CM | POA: Diagnosis not present

## 2017-03-22 DIAGNOSIS — E876 Hypokalemia: Secondary | ICD-10-CM | POA: Diagnosis not present

## 2017-03-22 DIAGNOSIS — D631 Anemia in chronic kidney disease: Secondary | ICD-10-CM | POA: Diagnosis not present

## 2017-03-22 DIAGNOSIS — T8579XA Infection and inflammatory reaction due to other internal prosthetic devices, implants and grafts, initial encounter: Secondary | ICD-10-CM | POA: Diagnosis not present

## 2017-03-22 DIAGNOSIS — E1129 Type 2 diabetes mellitus with other diabetic kidney complication: Secondary | ICD-10-CM | POA: Diagnosis not present

## 2017-03-23 ENCOUNTER — Encounter (HOSPITAL_COMMUNITY)
Admission: RE | Admit: 2017-03-23 | Discharge: 2017-03-23 | Disposition: A | Discharge status: Home / Self Care | Payer: Medicare Other | Source: Ambulatory Visit | Attending: Pulmonary Disease | Admitting: Pulmonary Disease

## 2017-03-23 VITALS — Wt 138.9 lb

## 2017-03-23 DIAGNOSIS — Z7982 Long term (current) use of aspirin: Secondary | ICD-10-CM | POA: Diagnosis not present

## 2017-03-23 DIAGNOSIS — K219 Gastro-esophageal reflux disease without esophagitis: Secondary | ICD-10-CM | POA: Diagnosis not present

## 2017-03-23 DIAGNOSIS — Z942 Lung transplant status: Secondary | ICD-10-CM

## 2017-03-23 DIAGNOSIS — Z794 Long term (current) use of insulin: Secondary | ICD-10-CM | POA: Diagnosis not present

## 2017-03-23 DIAGNOSIS — Z7952 Long term (current) use of systemic steroids: Secondary | ICD-10-CM | POA: Diagnosis not present

## 2017-03-23 DIAGNOSIS — Z79899 Other long term (current) drug therapy: Secondary | ICD-10-CM | POA: Diagnosis not present

## 2017-03-23 DIAGNOSIS — G4733 Obstructive sleep apnea (adult) (pediatric): Secondary | ICD-10-CM | POA: Diagnosis not present

## 2017-03-23 NOTE — Progress Notes (Signed)
Daily Session Note  Patient Details  Name: [name] MRN: [id number] Date of Birth: 09-20-1950 Referring Provider:     Pulmonary Rehab Walk Test from 03/14/2017 in [place]  Referring Provider  Dr. Nelda Marseille      Encounter Date: 03/23/2017  Check In:     Session Check In - 03/23/17 1026      Check-In   Location [facility]   Staff Present Su Hilt, MS, ACSM RCEP, Exercise Physiologist;Lisa Ysidro Evert, RN;Portia Rollene Rotunda, RN, BSN   Supervising physician immediately available to respond to emergencies [facility] immediately available   Physician(s) Dr. Horris Latino   Medication changes reported     No   Fall or balance concerns reported    No   Tobacco Cessation No Change   Warm-up and Cool-down Performed as group-led instruction   Resistance Training Performed Yes   VAD Patient? No     Pain Assessment   Currently in Pain? No/denies   Multiple Pain Sites No      Capillary Blood Glucose: No results found for this or any previous visit (from the past 24 hour(s)).     POCT Glucose - 03/23/17 1245      POCT Blood Glucose   Pre-Exercise 250 mg/dL   Post-Exercise 179 mg/dL         Exercise Prescription Changes - 03/23/17 1200      Response to Exercise   Blood Pressure (Admit) 110/70   Blood Pressure (Exercise) 100/70   Blood Pressure (Exit) 130/86   Heart Rate (Admit) 77 bpm   Heart Rate (Exercise) 94 bpm   Heart Rate (Exit) 83 bpm   Oxygen Saturation (Admit) 96 %   Oxygen Saturation (Exercise) 89 %   Oxygen Saturation (Exit) 91 %   Rating of Perceived Exertion (Exercise) 9   Perceived Dyspnea (Exercise) 0   Duration Progress to 45 minutes of aerobic exercise without signs/symptoms of physical distress   Intensity Other (comment)  40-80% HRR     Progression   Progression Continue to progress workloads to maintain intensity without signs/symptoms of physical distress.     Resistance Training   Training Prescription Yes   Weight orange bands   Reps 10-15     Oxygen   Oxygen --  Room air     NuStep   Level 2   Minutes 17   METs 1.9     Track   Laps 7.5   Minutes 17      History  Smoking Status  . Former Smoker  . Packs/day: 0.30  . Years: 20.00  . Types: Cigarettes  . Quit date: 06/06/2002  Smokeless Tobacco  . Never Used    Goals Met:  Exercise tolerated well No report of cardiac concerns or symptoms Strength training completed today  Goals Unmet:  Not Applicable  Comments: Service time is from 10:30a to 12:35p    Dr. Rush Farmer is Medical Director for Pulmonary Rehab at [facility].

## 2017-03-24 DIAGNOSIS — D631 Anemia in chronic kidney disease: Secondary | ICD-10-CM | POA: Diagnosis not present

## 2017-03-24 DIAGNOSIS — N186 End stage renal disease: Secondary | ICD-10-CM | POA: Diagnosis not present

## 2017-03-24 DIAGNOSIS — E1129 Type 2 diabetes mellitus with other diabetic kidney complication: Secondary | ICD-10-CM | POA: Diagnosis not present

## 2017-03-24 DIAGNOSIS — N2581 Secondary hyperparathyroidism of renal origin: Secondary | ICD-10-CM | POA: Diagnosis not present

## 2017-03-24 DIAGNOSIS — E876 Hypokalemia: Secondary | ICD-10-CM | POA: Diagnosis not present

## 2017-03-24 DIAGNOSIS — T8579XA Infection and inflammatory reaction due to other internal prosthetic devices, implants and grafts, initial encounter: Secondary | ICD-10-CM | POA: Diagnosis not present

## 2017-03-27 DIAGNOSIS — E1129 Type 2 diabetes mellitus with other diabetic kidney complication: Secondary | ICD-10-CM | POA: Diagnosis not present

## 2017-03-27 DIAGNOSIS — N2581 Secondary hyperparathyroidism of renal origin: Secondary | ICD-10-CM | POA: Diagnosis not present

## 2017-03-27 DIAGNOSIS — E876 Hypokalemia: Secondary | ICD-10-CM | POA: Diagnosis not present

## 2017-03-27 DIAGNOSIS — T8579XA Infection and inflammatory reaction due to other internal prosthetic devices, implants and grafts, initial encounter: Secondary | ICD-10-CM | POA: Diagnosis not present

## 2017-03-27 DIAGNOSIS — D631 Anemia in chronic kidney disease: Secondary | ICD-10-CM | POA: Diagnosis not present

## 2017-03-27 DIAGNOSIS — N186 End stage renal disease: Secondary | ICD-10-CM | POA: Diagnosis not present

## 2017-03-27 NOTE — Progress Notes (Signed)
Pulmonary Individual Treatment Plan  Patient Details  Name: [name] MRN: [id number] Date of Birth: 1950-11-11 Referring Provider:     Pulmonary Rehab Walk Test from 03/14/2017 in [place]  Referring Provider  Dr. Nelda Marseille      Initial Encounter Date:    Pulmonary Rehab Walk Test from 03/14/2017 in [place]  Date  03/16/17  Referring Provider  Dr. Nelda Marseille      Visit Diagnosis: Status post lung transplantation [facility])  Patient's Home Medications on Admission:   Current Outpatient Prescriptions:  .  ACCU-CHEK FASTCLIX LANCETS MISC, As directed up to 4 times daily, Disp: , Rfl: 3 .  acetaminophen (TYLENOL) 500 MG tablet, Take 500-1,000 mg by mouth every 6 (six) hours as needed for headache., Disp: , Rfl:  .  aspirin 81 MG tablet, Take 81 mg by mouth daily. , Disp: , Rfl:  .  azaTHIOprine (IMURAN) 50 MG tablet, Take 1/2 tablet (25 mg) every Monday, Wednesday and Friday after HD., Disp: , Rfl:  .  calcium acetate (PHOSLO) 667 MG capsule, Take 1,334 mg by mouth 3 (three) times daily before meals. , Disp: , Rfl:  .  cycloSPORINE modified (NEORAL) 25 MG capsule, Take 75 mg by mouth 2 (two) times daily. , Disp: , Rfl:  .  cycloSPORINE modified (NEORAL) 25 MG capsule, Take by mouth., Disp: , Rfl:  .  doxercalciferol (HECTOROL) 4 MCG/2ML injection, Inject into the vein., Disp: , Rfl:  .  fluticasone (FLONASE) 50 MCG/ACT nasal spray, Place 2 sprays into the nose daily. (Patient taking differently: Place 2 sprays into the nose daily as needed for allergies. ), Disp: 30 g, Rfl: 2 .  hydrOXYzine (ATARAX/VISTARIL) 25 MG tablet, Take by mouth., Disp: , Rfl:  .  insulin regular (NOVOLIN R) 100 units/mL injection, Inject 5 Units into the skin 3 (three) times daily before meals. Additional units (per sliding scale): BGL 201-250 = 1 unit; 251-300 = 2 units; 301-350 = 3 units; 351-400 = units; >401 = 5 units + CALL LUNG COORDINATOR @  [facility], Disp: , Rfl:  .  insulin regular (NOVOLIN R,HUMULIN R) 100 units/mL injection, 201 - 250 mg/dL 1 units, 251 - 300 mg/dL 2 units, 301 - 350 mg/dL 3 units, >350 mg/dL Notify Provider, Disp: , Rfl:  .  latanoprost (XALATAN) 0.005 % ophthalmic solution, Place 1 drop into both eyes at bedtime., Disp: , Rfl:  .  metoCLOPramide (REGLAN) 5 MG tablet, Take by mouth., Disp: , Rfl:  .  midodrine (PROAMATINE) 10 MG tablet, Take by mouth., Disp: , Rfl:  .  Multiple Vitamin (MULTIVITAMIN WITH MINERALS) TABS tablet, Take 1 tablet by mouth daily., Disp: , Rfl:  .  omeprazole (PRILOSEC) 20 MG capsule, Take by mouth., Disp: , Rfl:  .  ondansetron (ZOFRAN) 4 MG tablet, Take 4 mg by mouth every 8 (eight) hours as needed for nausea or vomiting. , Disp: , Rfl:  .  polyvinyl alcohol-povidone (REFRESH) 1.4-0.6 % ophthalmic solution, Place 1-2 drops into both eyes daily as needed (for dryness)., Disp: , Rfl:  .  pravastatin (PRAVACHOL) 20 MG tablet, Take 20 mg by mouth every evening. , Disp: , Rfl:  .  predniSONE (DELTASONE) 5 MG tablet, Take by mouth., Disp: , Rfl:  .  sertraline (ZOLOFT) 50 MG tablet, Take 50 mg by mouth daily., Disp: , Rfl:  .  sulfamethoxazole-trimethoprim (BACTRIM,SEPTRA) 400-80 MG tablet, Take 1 tablet by mouth 3 (three) times a week. Monday,  Wednesday, Friday, Disp: , Rfl:  .  valGANciclovir (VALCYTE) 450 MG tablet, Take 1 tablet by mouth See admin instructions. Every Monday and Friday AFTER DIALYSIS, Disp: , Rfl:   Past Medical History: Past Medical History:  Diagnosis Date  . Aortic valve disorders   . Benign neoplasm of colon   . Degeneration of intervertebral disc, site unspecified   . Diabetes mellitus without complication ([place])   . 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 Esophageal reflux   . ESRD (end stage renal disease) on dialysis ([place])   . Essential hypertension   . Obstructive sleep apnea (adult) (pediatric)   . Osteoarthrosis, unspecified  whether generalized or localized, unspecified site   . Other and unspecified hyperlipidemia   . Pneumonia    interstitial pneumonia  . Pulmonary fibrosis ([place])   . Renal disorder   . Respiratory failure with hypoxia ([place]) 12/2015  . Shortness of breath dyspnea   . Transplanted, lung ([place])   . Unspecified essential hypertension     Tobacco Use: History  Smoking Status  . Former Smoker  . Packs/day: 0.30  . Years: 20.00  . Types: Cigarettes  . Quit date: 06/06/2002  Smokeless Tobacco  . Never Used    Labs: Recent Review Flowsheet Data    Labs for ITP Cardiac and Pulmonary Rehab Latest Ref Rng & Units 04/19/2015 12/30/2015 01/01/2016 01/01/2016 01/01/2016   Hemoglobin A1c <5.7 % - - - - -   PHART 7.350 - 7.450 7.299(L) 7.368 7.458(H) 7.393 7.381   PCO2ART 35.0 - 45.0 mmHg 40.5 39.6 37.2 45.2(H) 42.4   HCO3 20.0 - 24.0 mEq/L 19.3(L) 22.7 26.0(H) 27.1(H) 24.7(H)   TCO2 0 - 100 mmol/L 20.5 24 27._0 ACIDBASEDEF 0.0 - 2.0 mmol/L 6.0(H) 2.0 - - -   O2SAT % - 91.0 91.1 100.0 94.0      Capillary Blood Glucose: Lab Results  Component Value Date   GLUCAP 122 (H) 01/01/2016   GLUCAP 82 01/01/2016   GLUCAP 83 01/01/2016   GLUCAP 82 01/01/2016   GLUCAP 107 (H) 12/31/2015       POCT Glucose    Row Name 03/21/17 1226 03/23/17 1245           POCT Blood Glucose   Pre-Exercise 143 mg/dL 250 mg/dL      Post-Exercise 75 mg/dL 179 mg/dL      Post-Exercise #2 68 mg/dL  -      Post-Exercise #3 88 mg/dL  -         Pulmonary Assessment Scores:     Pulmonary Assessment Scores    Row Name 03/14/17 1354 03/16/17 0724       ADL UCSD   ADL Phase Entry Entry    SOB Score total 30  -      CAT Score   CAT Score 8  Entry  -      mMRC Score   mMRC Score  - 1       Pulmonary Function Assessment:     Pulmonary Function Assessment - 03/13/17 0941      Breath   Bilateral Breath Sounds Other   Other right lung clear but deminished throughout, left upper clear, fine  crackles mid to lower base   Shortness of Breath No      Exercise Target Goals:    Exercise Program Goal: Individual exercise prescription set with THRR, safety & activity barriers. Participant demonstrates ability to understand and report RPE using BORG  scale, to self-measure pulse accurately, and to acknowledge the importance of the exercise prescription.  Exercise Prescription Goal: Starting with aerobic activity 30 plus minutes a day, 3 days per week for initial exercise prescription. Provide home exercise prescription and guidelines that participant acknowledges understanding prior to discharge.  Activity Barriers & Risk Stratification:     Activity Barriers & Cardiac Risk Stratification - 03/13/17 0921      Activity Barriers & Cardiac Risk Stratification   Activity Barriers Deconditioning;Balance Concerns      6 Minute Walk:     6 Minute Walk    Row Name 03/16/17 0716         6 Minute Walk   Phase Initial     Distance 1000 feet     Walk Time 6 minutes     # of Rest Breaks 0     MPH 1.89     METS 2.45     RPE 12     Perceived Dyspnea  0     Symptoms Yes (comment)     Comments 5/10 calf pain     Resting HR 77 bpm     Resting BP 105/62     Resting Oxygen Saturation  93 %     Exercise Oxygen Saturation  during 6 min walk 88 %     Max Ex. HR 91 bpm     Max Ex. BP 182/79     2 Minute Post BP 173/82  159/80       Interval HR   1 Minute HR 77     2 Minute HR 82     3 Minute HR 84     4 Minute HR 89     5 Minute HR 90     6 Minute HR 91     Interval Heart Rate? Yes       Interval Oxygen   Interval Oxygen? Yes     Baseline Oxygen Saturation % 93 %     1 Minute Oxygen Saturation % 93 %     1 Minute Liters of Oxygen 0 L     2 Minute Oxygen Saturation % 89 %     2 Minute Liters of Oxygen 0 L     3 Minute Oxygen Saturation % 89 %     3 Minute Liters of Oxygen 0 L     4 Minute Oxygen Saturation % 90 %     4 Minute Liters of Oxygen 0 L     5 Minute Oxygen  Saturation % 88 %     5 Minute Liters of Oxygen 0 L     6 Minute Oxygen Saturation % 90 %     6 Minute Liters of Oxygen 0 L        Oxygen Initial Assessment:     Oxygen Initial Assessment - 03/16/17 0724      Initial 6 min Walk   Oxygen Used None     Program Oxygen Prescription   Program Oxygen Prescription None      Oxygen Re-Evaluation:     Oxygen Re-Evaluation    Row Name 03/26/17 2135             Program Oxygen Prescription   Program Oxygen Prescription None         Home Oxygen   Home Oxygen Device None       Sleep Oxygen Prescription CPAP       Home Exercise Oxygen Prescription None  Home at Rest Exercise Oxygen Prescription None       Compliance with Home Oxygen Use No         Goals/Expected Outcomes   Comments MD recently ordered an overnight oximetry to determine if patient still need CPAP since lung transplant and significant weight loss          Oxygen Discharge (Final Oxygen Re-Evaluation):     Oxygen Re-Evaluation - 03/26/17 2135      Program Oxygen Prescription   Program Oxygen Prescription None     Home Oxygen   Home Oxygen Device None   Sleep Oxygen Prescription CPAP   Home Exercise Oxygen Prescription None   Home at Rest Exercise Oxygen Prescription None   Compliance with Home Oxygen Use No     Goals/Expected Outcomes   Comments MD recently ordered an overnight oximetry to determine if patient still need CPAP since lung transplant and significant weight loss      Initial Exercise Prescription:     Initial Exercise Prescription - 03/16/17 0700      Date of Initial Exercise RX and Referring Provider   Date 03/16/17   Referring Provider Dr. Nelda Marseille     Bike   Level 0.4   Minutes 17     NuStep   Level 2   Minutes 17   METs 1.4     Track   Laps 8   Minutes 17     Prescription Details   Frequency (times per week) 2   Duration Progress to 45 minutes of aerobic exercise without signs/symptoms of physical distress      Intensity   THRR 40-80% of Max Heartrate 61-122   Ratings of Perceived Exertion 11-13   Perceived Dyspnea 0-4     Progression   Progression Continue progressive overload as per policy without signs/symptoms or physical distress.     Resistance Training   Training Prescription Yes   Weight orange bands   Reps 10-15      Perform Capillary Blood Glucose checks as needed.  Exercise Prescription Changes:     Exercise Prescription Changes    Row Name 03/21/17 1228 03/23/17 1200           Response to Exercise   Blood Pressure (Admit) 112/72 110/70      Blood Pressure (Exercise) 132/88 100/70      Blood Pressure (Exit) 100/66 130/86      Heart Rate (Admit) 74 bpm 77 bpm      Heart Rate (Exercise) 80 bpm 94 bpm      Heart Rate (Exit) 76 bpm 83 bpm      Oxygen Saturation (Admit) 95 % 96 %      Oxygen Saturation (Exercise) 88 % 89 %      Oxygen Saturation (Exit) 76 % 91 %      Rating of Perceived Exertion (Exercise) 12 9      Perceived Dyspnea (Exercise) 0 0      Duration Progress to 45 minutes of aerobic exercise without signs/symptoms of physical distress Progress to 45 minutes of aerobic exercise without signs/symptoms of physical distress      Intensity Other (comment)  40-80% HRR Other (comment)  40-80% HRR        Progression   Progression Continue to progress workloads to maintain intensity without signs/symptoms of physical distress. Continue to progress workloads to maintain intensity without signs/symptoms of physical distress.        Resistance Training   Training Prescription Yes Yes  Weight orange bands orange bands      Reps 10-15 10-15        Oxygen   Oxygen -  Room air -  Room air        Bike   Level 0.4  -      Minutes 17  -        NuStep   Level 2 2      Minutes 17 17      METs 1.4 1.9        Track   Laps  - 7.5      Minutes  - 17         Exercise Comments:   Exercise Goals and Review:     Exercise Goals    Row Name 03/13/17  0921             Exercise Goals   Increase Physical Activity Yes       Intervention Provide advice, education, support and counseling about physical activity/exercise needs.;Develop an individualized exercise prescription for aerobic and resistive training based on initial evaluation findings, risk stratification, comorbidities and participant's personal goals.       Expected Outcomes Achievement of increased cardiorespiratory fitness and enhanced flexibility, muscular endurance and strength shown through measurements of functional capacity and personal statement of participant.       Increase Strength and Stamina Yes       Intervention Provide advice, education, support and counseling about physical activity/exercise needs.;Develop an individualized exercise prescription for aerobic and resistive training based on initial evaluation findings, risk stratification, comorbidities and participant's personal goals.       Expected Outcomes Achievement of increased cardiorespiratory fitness and enhanced flexibility, muscular endurance and strength shown through measurements of functional capacity and personal statement of participant.       Able to understand and use rate of perceived exertion (RPE) scale Yes       Intervention Provide education and explanation on how to use RPE scale       Expected Outcomes Short Term: Able to use RPE daily in rehab to express subjective intensity level;Long Term:  Able to use RPE to guide intensity level when exercising independently       Able to understand and use Dyspnea scale Yes       Intervention Provide education and explanation on how to use Dyspnea scale       Expected Outcomes Short Term: Able to use Dyspnea scale daily in rehab to express subjective sense of shortness of breath during exertion;Long Term: Able to use Dyspnea scale to guide intensity level when exercising independently       Knowledge and understanding of Target Heart Rate Range (THRR) Yes        Intervention Provide education and explanation of THRR including how the numbers were predicted and where they are located for reference       Expected Outcomes Short Term: Able to state/look up THRR;Long Term: Able to use THRR to govern intensity when exercising independently;Short Term: Able to use daily as guideline for intensity in rehab       Understanding of Exercise Prescription Yes       Intervention Provide education, explanation, and written materials on patient's individual exercise prescription       Expected Outcomes Short Term: Able to explain program exercise prescription;Long Term: Able to explain home exercise prescription to exercise independently          Exercise Goals Re-Evaluation :  Exercise Goals Re-Evaluation    [place] Name 03/27/17 1012             Exercise Goal Re-Evaluation   Exercise Goals Review Increase Physical Activity;Increase Strength and Stamina;Able to understand and use Dyspnea scale;Able to understand and use rate of perceived exertion (RPE) scale;Knowledge and understanding of Target Heart Rate Range (THRR);Understanding of Exercise Prescription       Comments Patient has only attended 2 exercise sessions. Will cont. to monitor and progress as able.        Expected Outcomes Through exercise at rehab and at home, patient will increase strength and stamina and decrease shortness of breath          Discharge Exercise Prescription (Final Exercise Prescription Changes):     Exercise Prescription Changes - 03/23/17 1200      Response to Exercise   Blood Pressure (Admit) 110/70   Blood Pressure (Exercise) 100/70   Blood Pressure (Exit) 130/86   Heart Rate (Admit) 77 bpm   Heart Rate (Exercise) 94 bpm   Heart Rate (Exit) 83 bpm   Oxygen Saturation (Admit) 96 %   Oxygen Saturation (Exercise) 89 %   Oxygen Saturation (Exit) 91 %   Rating of Perceived Exertion (Exercise) 9   Perceived Dyspnea (Exercise) 0   Duration Progress to 45 minutes of  aerobic exercise without signs/symptoms of physical distress   Intensity Other (comment)  40-80% HRR     Progression   Progression Continue to progress workloads to maintain intensity without signs/symptoms of physical distress.     Resistance Training   Training Prescription Yes   Weight orange bands   Reps 10-15     Oxygen   Oxygen --  Room air     NuStep   Level 2   Minutes 17   METs 1.9     Track   Laps 7.5   Minutes 17      Nutrition:  Target Goals: Understanding of nutrition guidelines, daily intake of sodium <1540m, cholesterol <2060m calories 30% from fat and 7% or less from saturated fats, daily to have 5 or more servings of fruits and vegetables.  Biometrics:     Pre Biometrics - 03/13/17 0922      Pre Biometrics   Grip Strength 20 kg       Nutrition Therapy Plan and Nutrition Goals:   Nutrition Discharge: Rate Your Plate Scores:     Nutrition Assessments - 03/16/17 1453      Rate Your Plate Scores   Pre Score 55      Nutrition Goals Re-Evaluation:   Nutrition Goals Discharge (Final Nutrition Goals Re-Evaluation):   Psychosocial: Target Goals: Acknowledge presence or absence of significant depression and/or stress, maximize coping skills, provide positive support system. Participant is able to verbalize types and ability to use techniques and skills needed for reducing stress and depression.  Initial Review & Psychosocial Screening:     Initial Psych Review & Screening - 03/13/17 0942      Initial Review   Current issues with None Identified     Family Dynamics   Good Support System? Yes     Barriers   Psychosocial barriers to participate in program There are no identifiable barriers or psychosocial needs.     Screening Interventions   Interventions Encouraged to exercise      Quality of Life Scores:   PHQ-9: Recent Review Flowsheet Data    [facility] 03/13[facility]2017 08/21/2015 07/22/2014 05/09/2014 12/03/2012  Decreased Interest 0 0 0 0 0   Down, Depressed, Hopeless 0 0 0 0 0   PHQ - 2 Score 0 0 0 0 0     Interpretation of Total Score  Total Score Depression Severity:  1-4 = Minimal depression, 5-9 = Mild depression, 10-14 = Moderate depression, 15-19 = Moderately severe depression, 20-27 = Severe depression   Psychosocial Evaluation and Intervention:     Psychosocial Evaluation - 03/13/17 0943      Psychosocial Evaluation & Interventions   Interventions Encouraged to exercise with the program and follow exercise prescription   Expected Outcomes patient will remain free from psysocial barriers to participate in pulmonary rehab   Continue Psychosocial Services  No Follow up required      Psychosocial Re-Evaluation:     Psychosocial Re-Evaluation    [place] Name 03/26/17 2137             Psychosocial Re-Evaluation   Current issues with None Identified       Expected Outcomes patient will remain free from psychosocial barriers to participation       Interventions Encouraged to attend Pulmonary Rehabilitation for the exercise       Continue Psychosocial Services  No Follow up required          Psychosocial Discharge (Final Psychosocial Re-Evaluation):     Psychosocial Re-Evaluation - 03/26/17 2137      Psychosocial Re-Evaluation   Current issues with None Identified   Expected Outcomes patient will remain free from psychosocial barriers to participation   Interventions Encouraged to attend Pulmonary Rehabilitation for the exercise   Continue Psychosocial Services  No Follow up required      Education: Education Goals: Education classes will be provided on a weekly basis, covering required topics. Participant will state understanding/return demonstration of topics presented.  Learning Barriers/Preferences:     Learning Barriers/Preferences - 03/13/17 0940      Learning Barriers/Preferences   Learning Barriers None   Learning Preferences Individual Instruction;Skilled  Demonstration;Written Material;Verbal Instruction      Education Topics: Risk Factor Reduction:  -Group instruction that is supported by a PowerPoint presentation. Instructor discusses the definition of a risk factor, different risk factors for pulmonary disease, and how the heart and lungs work together.     Nutrition for Pulmonary Patient:  -Group instruction provided by PowerPoint slides, verbal discussion, and written materials to support subject matter. The instructor gives an explanation and review of healthy diet recommendations, which includes a discussion on weight management, recommendations for fruit and vegetable consumption, as well as protein, fluid, caffeine, fiber, sodium, sugar, and alcohol. Tips for eating when patients are short of breath are discussed.   Pursed Lip Breathing:  -Group instruction that is supported by demonstration and informational handouts. Instructor discusses the benefits of pursed lip and diaphragmatic breathing and detailed demonstration on how to preform both.     Oxygen Safety:  -Group instruction provided by PowerPoint, verbal discussion, and written material to support subject matter. There is an overview of "What is Oxygen" and "Why do we need it".  Instructor also reviews how to create a safe environment for oxygen use, the importance of using oxygen as prescribed, and the risks of noncompliance. There is a brief discussion on traveling with oxygen and resources the patient may utilize.   Oxygen Equipment:  -Group instruction provided by [facility] Staff utilizing handouts, written materials, and equipment demonstrations.   Signs and Symptoms:  -Group instruction provided by written material and verbal  discussion to support subject matter. Warning signs and symptoms of infection, stroke, and heart attack are reviewed and when to call the physician/911 reinforced. Tips for preventing the spread of infection discussed.   Advanced Directives:   -Group instruction provided by verbal instruction and written material to support subject matter. Instructor reviews Advanced Directive laws and proper instruction for filling out document.   Pulmonary Video:  -Group video education that reviews the importance of medication and oxygen compliance, exercise, good nutrition, pulmonary hygiene, and pursed lip and diaphragmatic breathing for the pulmonary patient.   Exercise for the Pulmonary Patient:  -Group instruction that is supported by a PowerPoint presentation. Instructor discusses benefits of exercise, core components of exercise, frequency, duration, and intensity of an exercise routine, importance of utilizing pulse oximetry during exercise, safety while exercising, and options of places to exercise outside of rehab.     Pulmonary Medications:  -Verbally interactive group education provided by instructor with focus on inhaled medications and proper administration.   Anatomy and Physiology of the Respiratory System and Intimacy:  -Group instruction provided by PowerPoint, verbal discussion, and written material to support subject matter. Instructor reviews respiratory cycle and anatomical components of the respiratory system and their functions. Instructor also reviews differences in obstructive and restrictive respiratory diseases with examples of each. Intimacy, Sex, and Sexuality differences are reviewed with a discussion on how relationships can change when diagnosed with pulmonary disease. Common sexual concerns are reviewed.   MD DAY -A group question and answer session with a medical doctor that allows participants to ask questions that relate to their pulmonary disease state.   OTHER EDUCATION -Group or individual verbal, written, or video instructions that support the educational goals of the pulmonary rehab program.   Knowledge Questionnaire Score:     Knowledge Questionnaire Score - 03/14/17 1353      Knowledge  Questionnaire Score   Pre Score 9/13      Core Components/Risk Factors/Patient Goals at Admission:     Personal Goals and Risk Factors at Admission - 03/13/17 0942      Core Components/Risk Factors/Patient Goals on Admission   Improve shortness of breath with ADL's Yes   Intervention Provide education, individualized exercise plan and daily activity instruction to help decrease symptoms of SOB with activities of daily living.   Expected Outcomes Short Term: Achieves a reduction of symptoms when performing activities of daily living.      Core Components/Risk Factors/Patient Goals Review:      Goals and Risk Factor Review    Row Name 03/26/17 2136             Core Components/Risk Factors/Patient Goals Review   Personal Goals Review Improve shortness of breath with ADL's       Review patient has only attended 2 exercise sessions since admission. Too early to evaluate progress towards rehab goals. should see progression over the next 30 days       Expected Outcomes see admission goals          Core Components/Risk Factors/Patient Goals at Discharge (Final Review):      Goals and Risk Factor Review - 03/26/17 2136      Core Components/Risk Factors/Patient Goals Review   Personal Goals Review Improve shortness of breath with ADL's   Review patient has only attended 2 exercise sessions since admission. Too early to evaluate progress towards rehab goals. should see progression over the next 30 days   Expected Outcomes see admission goals  ITP Comments:   Comments: ITP REVIEW  Recommend continued exercise, life style modification, education, and utilization of breathing techniques to increase stamina and strength and decrease shortness of breath with exertion. Expect to see progress towards pulmonary goals over the next 30 days

## 2017-03-28 ENCOUNTER — Encounter (HOSPITAL_COMMUNITY)
Admission: RE | Admit: 2017-03-28 | Discharge: 2017-03-28 | Disposition: A | Discharge status: Home / Self Care | Payer: Medicare Other | Source: Ambulatory Visit | Attending: Pulmonary Disease | Admitting: Pulmonary Disease

## 2017-03-28 VITALS — Wt 138.2 lb

## 2017-03-28 DIAGNOSIS — Z794 Long term (current) use of insulin: Secondary | ICD-10-CM | POA: Diagnosis not present

## 2017-03-28 DIAGNOSIS — Z7952 Long term (current) use of systemic steroids: Secondary | ICD-10-CM | POA: Diagnosis not present

## 2017-03-28 DIAGNOSIS — Z7982 Long term (current) use of aspirin: Secondary | ICD-10-CM | POA: Diagnosis not present

## 2017-03-28 DIAGNOSIS — Z79899 Other long term (current) drug therapy: Secondary | ICD-10-CM | POA: Diagnosis not present

## 2017-03-28 DIAGNOSIS — K219 Gastro-esophageal reflux disease without esophagitis: Secondary | ICD-10-CM | POA: Diagnosis not present

## 2017-03-28 DIAGNOSIS — Z942 Lung transplant status: Secondary | ICD-10-CM | POA: Diagnosis not present

## 2017-03-28 NOTE — Progress Notes (Signed)
Daily Session Note  Patient Details  Name: [name] MRN: [id number] Date of Birth: Oct 16, 1950 Referring Provider:     Pulmonary Rehab Walk Test from 03/14/2017 in [place]  Referring Provider  Dr. Nelda Marseille      Encounter Date: 03/28/2017  Check In:     Session Check In - 03/28/17 1030      Check-In   Location [facility]   Staff Present Rosebud Poles, RN, BSN;Roshard Rezabek, MS, ACSM RCEP, Exercise Physiologist;Lisa Ysidro Evert, RN;Portia Rollene Rotunda, RN, BSN   Supervising physician immediately available to respond to emergencies [facility] immediately available   Physician(s) Dr. Horris Latino   Medication changes reported     No   Fall or balance concerns reported    No   Tobacco Cessation No Change   Warm-up and Cool-down Performed as group-led instruction   Resistance Training Performed Yes   VAD Patient? No     Pain Assessment   Currently in Pain? No/denies   Multiple Pain Sites No      Capillary Blood Glucose: No results found for this or any previous visit (from the past 24 hour(s)).     POCT Glucose - 03/28/17 1213      POCT Blood Glucose   Pre-Exercise 143 mg/dL   Post-Exercise 113 mg/dL         Exercise Prescription Changes - 03/28/17 1200      Response to Exercise   Blood Pressure (Admit) 140/60   Blood Pressure (Exercise) 130/70   Blood Pressure (Exit) 112/70   Heart Rate (Admit) 78 bpm   Heart Rate (Exercise) 92 bpm   Heart Rate (Exit) 80 bpm   Oxygen Saturation (Admit) 95 %   Oxygen Saturation (Exercise) 88 %   Oxygen Saturation (Exit) 95 %   Rating of Perceived Exertion (Exercise) 13   Perceived Dyspnea (Exercise) 1   Duration Progress to 45 minutes of aerobic exercise without signs/symptoms of physical distress   Intensity Other (comment)  40-80% HRR     Progression   Progression Continue to progress workloads to maintain intensity without signs/symptoms of physical distress.     Resistance Training   Training Prescription Yes   Weight orange bands   Reps 10-15     Oxygen   Oxygen --  Room air     Bike   Level 0.4   Minutes 17     NuStep   Level 2   Minutes 17   METs 1.8     Track   Laps 8   Minutes 17      History  Smoking Status  . Former Smoker  . Packs/day: 0.30  . Years: 20.00  . Types: Cigarettes  . Quit date: 06/06/2002  Smokeless Tobacco  . Never Used    Goals Met:  Exercise tolerated well No report of cardiac concerns or symptoms Strength training completed today  Goals Unmet:  Not Applicable  Comments: Service time is from 10:30a to 12:05p    Dr. Rush Farmer is Medical Director for Pulmonary Rehab at [facility]

## 2017-03-29 DIAGNOSIS — D631 Anemia in chronic kidney disease: Secondary | ICD-10-CM | POA: Diagnosis not present

## 2017-03-29 DIAGNOSIS — T8579XA Infection and inflammatory reaction due to other internal prosthetic devices, implants and grafts, initial encounter: Secondary | ICD-10-CM | POA: Diagnosis not present

## 2017-03-29 DIAGNOSIS — N186 End stage renal disease: Secondary | ICD-10-CM | POA: Diagnosis not present

## 2017-03-29 DIAGNOSIS — N2581 Secondary hyperparathyroidism of renal origin: Secondary | ICD-10-CM | POA: Diagnosis not present

## 2017-03-29 DIAGNOSIS — E1129 Type 2 diabetes mellitus with other diabetic kidney complication: Secondary | ICD-10-CM | POA: Diagnosis not present

## 2017-03-29 DIAGNOSIS — E876 Hypokalemia: Secondary | ICD-10-CM | POA: Diagnosis not present

## 2017-03-30 ENCOUNTER — Encounter (HOSPITAL_COMMUNITY)
Admission: RE | Admit: 2017-03-30 | Discharge: 2017-03-30 | Disposition: A | Discharge status: Home / Self Care | Payer: Medicare Other | Source: Ambulatory Visit | Attending: Internal Medicine | Admitting: Internal Medicine

## 2017-03-30 VITALS — Wt 139.3 lb

## 2017-03-30 DIAGNOSIS — Z942 Lung transplant status: Secondary | ICD-10-CM

## 2017-03-30 DIAGNOSIS — Z794 Long term (current) use of insulin: Secondary | ICD-10-CM | POA: Diagnosis not present

## 2017-03-30 DIAGNOSIS — Z79899 Other long term (current) drug therapy: Secondary | ICD-10-CM | POA: Diagnosis not present

## 2017-03-30 DIAGNOSIS — K219 Gastro-esophageal reflux disease without esophagitis: Secondary | ICD-10-CM | POA: Diagnosis not present

## 2017-03-30 DIAGNOSIS — Z7952 Long term (current) use of systemic steroids: Secondary | ICD-10-CM | POA: Diagnosis not present

## 2017-03-30 DIAGNOSIS — Z7982 Long term (current) use of aspirin: Secondary | ICD-10-CM | POA: Diagnosis not present

## 2017-03-30 NOTE — Progress Notes (Signed)
Daily Session Note  Patient Details  Name: [name] MRN: [id number] Date of Birth: 11/16/1950 Referring Provider:     Pulmonary Rehab Walk Test from 03/14/2017 in [facility]  Referring Provider  Dr. Yacoub      Encounter Date: 03/30/2017  Check In:     Session Check In - 03/30/17 1103      Check-In   [facility]   Staff Present Lisa Hughes, RN;Portia Payne, RN, BSN; , MS, ACSM RCEP, Exercise Physiologist;Joan Behrens, RN, BSN   Supervising physician immediately available to respond to emergencies [facility] immediately available   Physician(s) Dr. Vega   Medication changes reported     No   Fall or balance concerns reported    No   Tobacco Cessation No Change   Warm-up and Cool-down Performed as group-led instruction   Resistance Training Performed Yes   VAD Patient? No     Pain Assessment   Currently in Pain? No/denies   Multiple Pain Sites No      Capillary Blood Glucose: No results found for this or any previous visit (from the past 24 hour(s)).      Exercise Prescription Changes - 03/30/17 1200      Response to Exercise   Blood Pressure (Admit) 104/54   Blood Pressure (Exercise) 110/60   Blood Pressure (Exit) 100/64   Heart Rate (Admit) 77 bpm   Heart Rate (Exercise) 83 bpm   Heart Rate (Exit) 80 bpm   Oxygen Saturation (Admit) 92 %   Oxygen Saturation (Exercise) 92 %   Oxygen Saturation (Exit) 96 %   Rating of Perceived Exertion (Exercise) 9   Perceived Dyspnea (Exercise) 1   Duration Progress to 45 minutes of aerobic exercise without signs/symptoms of physical distress   Intensity Other (comment)  40-80% HRR     Progression   Progression Continue to progress workloads to maintain intensity without signs/symptoms of physical distress.     Resistance Training   Training Prescription Yes   Weight orange bands   Reps 10-15     Oxygen   Oxygen --  Room air      Bike   Level 0.4   Minutes 17      History  Smoking Status  . Former Smoker  . Packs/day: 0.30  . Years: 20.00  . Types: Cigarettes  . Quit date: 06/06/2002  Smokeless Tobacco  . Never Used    Goals Met:  Exercise tolerated well No report of cardiac concerns or symptoms Strength training completed today  Goals Unmet:  Not Applicable  Comments: Service time is from 10:30a to 12:30p    Dr. Wesam G. Yacoub is Medical Director for Pulmonary Rehab at [facility]. 

## 2017-03-30 NOTE — Progress Notes (Signed)
JOSHAU CODE 66 y.o. male  DOB: 12-05-1950 MRN: [id number]           Nutrition Note 1. 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 Phoslo, Novolin R, Reglan, Deltasone noted  Labs:  Lipid Panel  11/15/2016 Hemoglobin A1c 5.7 11/15/2016 Total Chol 161   Triglyceride 187   HDL  61   LDL  63  Note Spoke with pt. Pt is at a normal wt. Pt eats 3 meals a day; most prepared at home.  Per discussion with pt, feel pt is following his Renal, Diabetic diet well. Pt is on a "32 ounce fluid restriction." Ways to manage feelings of dry mouth/dehyration discussed. Dietary adherence is supported by pt's wife, who is actively involved in pt's care. Pt states he has been to "at least 3 nutrition classes about his diet." Pt avoids most salty food; does not use canned/ convenience food. The role of sodium in lung disease reviewed with pt. Pt is diabetic. Last A1c indicates blood glucose well controlled. Pt expressed understanding of the information reviewed via feedback method.    Nutrition Diagnosis ? Food-and nutrition-related knowledge deficit related to lack of exposure to information as related to diagnosis of pulmonary disease  Nutrition Intervention ? Pt's individual nutrition plan and goals reviewed with pt. ? Benefits of adopting healthy eating habits discussed when pt's Rate Your Plate reviewed.  Goal(s) Pt to maintain his current wt while in Pulmonary Rehab.  Plan:  Pt to attend Pulmonary Nutrition class - met 03/23/17 Will provide client-centered nutrition education as part of interdisciplinary care.   Monitor and evaluate progress toward nutrition goal with team.  Monitor and Evaluate progress toward nutrition goal with team.   Derek Mound, M.Ed, RD, LDN, CDE 03/30/2017 1:26 PM

## 2017-03-31 DIAGNOSIS — N186 End stage renal disease: Secondary | ICD-10-CM | POA: Diagnosis not present

## 2017-03-31 DIAGNOSIS — T8579XA Infection and inflammatory reaction due to other internal prosthetic devices, implants and grafts, initial encounter: Secondary | ICD-10-CM | POA: Diagnosis not present

## 2017-03-31 DIAGNOSIS — N2581 Secondary hyperparathyroidism of renal origin: Secondary | ICD-10-CM | POA: Diagnosis not present

## 2017-03-31 DIAGNOSIS — D631 Anemia in chronic kidney disease: Secondary | ICD-10-CM | POA: Diagnosis not present

## 2017-03-31 DIAGNOSIS — E1129 Type 2 diabetes mellitus with other diabetic kidney complication: Secondary | ICD-10-CM | POA: Diagnosis not present

## 2017-03-31 DIAGNOSIS — E876 Hypokalemia: Secondary | ICD-10-CM | POA: Diagnosis not present

## 2017-04-03 DIAGNOSIS — T8579XA Infection and inflammatory reaction due to other internal prosthetic devices, implants and grafts, initial encounter: Secondary | ICD-10-CM | POA: Diagnosis not present

## 2017-04-03 DIAGNOSIS — E1129 Type 2 diabetes mellitus with other diabetic kidney complication: Secondary | ICD-10-CM | POA: Diagnosis not present

## 2017-04-03 DIAGNOSIS — N2581 Secondary hyperparathyroidism of renal origin: Secondary | ICD-10-CM | POA: Diagnosis not present

## 2017-04-03 DIAGNOSIS — N186 End stage renal disease: Secondary | ICD-10-CM | POA: Diagnosis not present

## 2017-04-03 DIAGNOSIS — E876 Hypokalemia: Secondary | ICD-10-CM | POA: Diagnosis not present

## 2017-04-03 DIAGNOSIS — D631 Anemia in chronic kidney disease: Secondary | ICD-10-CM | POA: Diagnosis not present

## 2017-04-04 ENCOUNTER — Encounter (HOSPITAL_COMMUNITY)
Admission: RE | Admit: 2017-04-04 | Discharge: 2017-04-04 | Disposition: A | Discharge status: Home / Self Care | Payer: Medicare Other | Source: Ambulatory Visit | Attending: Pulmonary Disease | Admitting: Pulmonary Disease

## 2017-04-04 VITALS — Wt 142.2 lb

## 2017-04-04 DIAGNOSIS — Z7982 Long term (current) use of aspirin: Secondary | ICD-10-CM | POA: Diagnosis not present

## 2017-04-04 DIAGNOSIS — Z942 Lung transplant status: Secondary | ICD-10-CM

## 2017-04-04 DIAGNOSIS — Z794 Long term (current) use of insulin: Secondary | ICD-10-CM | POA: Diagnosis not present

## 2017-04-04 DIAGNOSIS — Z79899 Other long term (current) drug therapy: Secondary | ICD-10-CM | POA: Diagnosis not present

## 2017-04-04 DIAGNOSIS — K219 Gastro-esophageal reflux disease without esophagitis: Secondary | ICD-10-CM | POA: Diagnosis not present

## 2017-04-04 DIAGNOSIS — Z7952 Long term (current) use of systemic steroids: Secondary | ICD-10-CM | POA: Diagnosis not present

## 2017-04-04 NOTE — Progress Notes (Signed)
Daily Session Note  Patient Details  Name: [name] MRN: [id number] Date of Birth: [birth year] Referring Provider:     Pulmonary Rehab Walk Test from 03/14/2017 in [place]  Referring Provider  Dr. Nelda Marseille      Encounter Date: 04/04/2017  Check In:     Session Check In - 04/04/17 1030      Check-In   [facility]   Staff Present Rosebud Poles, RN, BSN;Molly diVincenzo, MS, ACSM RCEP, Exercise Physiologist;Lisa Ysidro Evert, RN;Lysbeth Dicola Rollene Rotunda, RN, BSN   Supervising physician immediately available to respond to emergencies [facility] immediately available   Physician(s) Dr. Wendee Beavers   Medication changes reported     No   Fall or balance concerns reported    No   Tobacco Cessation No Change   Warm-up and Cool-down Performed as group-led instruction   Resistance Training Performed Yes   VAD Patient? No     Pain Assessment   Currently in Pain? No/denies   Multiple Pain Sites No      Capillary Blood Glucose: No results found for this or any previous visit (from the past 24 hour(s)).     POCT Glucose - 04/04/17 1156      POCT Blood Glucose   Pre-Exercise 108 mg/dL   Post-Exercise 86 mg/dL         Exercise Prescription Changes - 04/04/17 1154      Response to Exercise   Blood Pressure (Admit) 122/70   Blood Pressure (Exercise) 138/54   Blood Pressure (Exit) 98/56   Heart Rate (Admit) 75 bpm   Heart Rate (Exercise) 82 bpm   Heart Rate (Exit) 79 bpm   Oxygen Saturation (Admit) 95 %   Oxygen Saturation (Exercise) 90 %   Oxygen Saturation (Exit) 94 %   Rating of Perceived Exertion (Exercise) 11   Perceived Dyspnea (Exercise) 1   Duration Progress to 45 minutes of aerobic exercise without signs/symptoms of physical distress   Intensity Other (comment)  40-80% HRR     Progression   Progression Continue to progress workloads to maintain intensity without signs/symptoms of physical distress.     Resistance Training   Training Prescription Yes   Weight orange bands   Reps 10-15     Oxygen   Oxygen --  Room air     Bike   Level 0.4   Minutes 17     NuStep   Level 2   Minutes 17   METs 1.7      History  Smoking Status  . Former Smoker  . Packs/day: 0.30  . Years: 20.00  . Types: Cigarettes  . Quit date: 06/06/2002  Smokeless Tobacco  . Never Used    Goals Met:  Independence with exercise equipment Using PLB without cueing & demonstrates good technique No report of cardiac concerns or symptoms Strength training completed today  Goals Unmet:  CBG. Patient did not complete last 15 min station related to low CBG. Simple carbs given and patient discharged in care of wife to get lunch.  Comments: Service time is from 1030 to 1200   Dr. Rush Farmer is Medical Director for Pulmonary Rehab at [facility].

## 2017-04-05 DIAGNOSIS — N186 End stage renal disease: Secondary | ICD-10-CM | POA: Diagnosis not present

## 2017-04-05 DIAGNOSIS — T8579XA Infection and inflammatory reaction due to other internal prosthetic devices, implants and grafts, initial encounter: Secondary | ICD-10-CM | POA: Diagnosis not present

## 2017-04-05 DIAGNOSIS — D631 Anemia in chronic kidney disease: Secondary | ICD-10-CM | POA: Diagnosis not present

## 2017-04-05 DIAGNOSIS — Z992 Dependence on renal dialysis: Secondary | ICD-10-CM | POA: Diagnosis not present

## 2017-04-05 DIAGNOSIS — E876 Hypokalemia: Secondary | ICD-10-CM | POA: Diagnosis not present

## 2017-04-05 DIAGNOSIS — E1129 Type 2 diabetes mellitus with other diabetic kidney complication: Secondary | ICD-10-CM | POA: Diagnosis not present

## 2017-04-05 DIAGNOSIS — N2581 Secondary hyperparathyroidism of renal origin: Secondary | ICD-10-CM | POA: Diagnosis not present

## 2017-04-05 DIAGNOSIS — T86819 Unspecified complication of lung transplant: Secondary | ICD-10-CM | POA: Diagnosis not present

## 2017-04-06 ENCOUNTER — Encounter (HOSPITAL_COMMUNITY)
Admission: RE | Admit: 2017-04-06 | Discharge: 2017-04-06 | Disposition: A | Discharge status: Home / Self Care | Payer: Medicare Other | Source: Ambulatory Visit | Attending: Pulmonary Disease | Admitting: Pulmonary Disease

## 2017-04-06 VITALS — Wt 140.0 lb

## 2017-04-06 DIAGNOSIS — Z942 Lung transplant status: Secondary | ICD-10-CM

## 2017-04-06 DIAGNOSIS — Z79899 Other long term (current) drug therapy: Secondary | ICD-10-CM | POA: Diagnosis not present

## 2017-04-06 DIAGNOSIS — Z7952 Long term (current) use of systemic steroids: Secondary | ICD-10-CM | POA: Insufficient documentation

## 2017-04-06 DIAGNOSIS — Z794 Long term (current) use of insulin: Secondary | ICD-10-CM | POA: Diagnosis not present

## 2017-04-06 DIAGNOSIS — I120 Hypertensive chronic kidney disease with stage 5 chronic kidney disease or end stage renal disease: Secondary | ICD-10-CM | POA: Insufficient documentation

## 2017-04-06 DIAGNOSIS — E1122 Type 2 diabetes mellitus with diabetic chronic kidney disease: Secondary | ICD-10-CM | POA: Diagnosis not present

## 2017-04-06 DIAGNOSIS — G4733 Obstructive sleep apnea (adult) (pediatric): Secondary | ICD-10-CM | POA: Diagnosis not present

## 2017-04-06 DIAGNOSIS — N186 End stage renal disease: Secondary | ICD-10-CM | POA: Insufficient documentation

## 2017-04-06 DIAGNOSIS — Z87891 Personal history of nicotine dependence: Secondary | ICD-10-CM | POA: Diagnosis not present

## 2017-04-06 DIAGNOSIS — K219 Gastro-esophageal reflux disease without esophagitis: Secondary | ICD-10-CM | POA: Diagnosis not present

## 2017-04-06 DIAGNOSIS — Z7982 Long term (current) use of aspirin: Secondary | ICD-10-CM | POA: Insufficient documentation

## 2017-04-06 DIAGNOSIS — Z992 Dependence on renal dialysis: Secondary | ICD-10-CM | POA: Diagnosis not present

## 2017-04-06 LAB — GLUCOSE, CAPILLARY: Glucose-Capillary: 144 mg/dL — ABNORMAL HIGH (ref 65–99)

## 2017-04-06 NOTE — Progress Notes (Signed)
Daily Session Note  Patient Details  Name: [name] MRN: [id number] Date of Birth: [birth year] Referring Provider:     Pulmonary Rehab Walk Test from 03/14/2017 in [place]  Referring Provider  Dr. Nelda Marseille      Encounter Date: 04/06/2017  Check In:     Session Check In - 04/06/17 1030      Check-In   [facility]   Staff Present Rosebud Poles, RN, BSN;Pascale Maves, MS, ACSM RCEP, Exercise Physiologist;Lisa Ysidro Evert, RN;Portia Rollene Rotunda, RN, BSN   Supervising physician immediately available to respond to emergencies [facility] immediately available   Physician(s) Dr. Wynetta Emery   Medication changes reported     No   Fall or balance concerns reported    No   Tobacco Cessation No Change   Warm-up and Cool-down Performed as group-led instruction   Resistance Training Performed Yes   VAD Patient? No     Pain Assessment   Currently in Pain? No/denies   Multiple Pain Sites No      Capillary Blood Glucose: Results for orders placed or performed during the hospital encounter of 04/06/17 (from the past 24 hour(s))  Glucose, capillary     Status: Abnormal   Collection Time: 04/06/17 12:16 PM  Result Value Ref Range   Glucose-Capillary 144 (H) 65 - 99 mg/dL       POCT Glucose - 04/06/17 1245      POCT Blood Glucose   Pre-Exercise 127 mg/dL   Post-Exercise 144 mg/dL         Exercise Prescription Changes - 04/06/17 1200      Response to Exercise   Blood Pressure (Admit) 108/54   Blood Pressure (Exercise) 154/74   Blood Pressure (Exit) 104/70   Heart Rate (Admit) 75 bpm   Heart Rate (Exercise) 80 bpm   Heart Rate (Exit) 78 bpm   Oxygen Saturation (Admit) 91 %   Oxygen Saturation (Exercise) 94 %   Oxygen Saturation (Exit) 97 %   Rating of Perceived Exertion (Exercise) 9   Perceived Dyspnea (Exercise) 0   Duration Progress to 45 minutes of aerobic exercise without signs/symptoms of physical distress   Intensity Other (comment)  40-80% HRR     Progression   Progression Continue to progress workloads to maintain intensity without signs/symptoms of physical distress.     Resistance Training   Training Prescription Yes   Weight orange bands   Reps 10-15     Oxygen   Oxygen --  Room air     NuStep   Level 3   Minutes 17   METs 1.6     Track   Laps 7   Minutes 17      History  Smoking Status  . Former Smoker  . Packs/day: 0.30  . Years: 20.00  . Types: Cigarettes  . Quit date: 06/06/2002  Smokeless Tobacco  . Never Used    Goals Met:  Exercise tolerated well No report of cardiac concerns or symptoms Strength training completed today  Goals Unmet:  Not Applicable  Comments: Service time is from 10:30a to 12:30p    Dr. Rush Farmer is Medical Director for Pulmonary Rehab at [facility].

## 2017-04-07 DIAGNOSIS — E876 Hypokalemia: Secondary | ICD-10-CM | POA: Diagnosis not present

## 2017-04-07 DIAGNOSIS — E1129 Type 2 diabetes mellitus with other diabetic kidney complication: Secondary | ICD-10-CM | POA: Diagnosis not present

## 2017-04-07 DIAGNOSIS — N2581 Secondary hyperparathyroidism of renal origin: Secondary | ICD-10-CM | POA: Diagnosis not present

## 2017-04-07 DIAGNOSIS — N186 End stage renal disease: Secondary | ICD-10-CM | POA: Diagnosis not present

## 2017-04-07 DIAGNOSIS — T8579XA Infection and inflammatory reaction due to other internal prosthetic devices, implants and grafts, initial encounter: Secondary | ICD-10-CM | POA: Diagnosis not present

## 2017-04-07 DIAGNOSIS — D631 Anemia in chronic kidney disease: Secondary | ICD-10-CM | POA: Diagnosis not present

## 2017-04-10 DIAGNOSIS — N186 End stage renal disease: Secondary | ICD-10-CM | POA: Diagnosis not present

## 2017-04-10 DIAGNOSIS — T8579XA Infection and inflammatory reaction due to other internal prosthetic devices, implants and grafts, initial encounter: Secondary | ICD-10-CM | POA: Diagnosis not present

## 2017-04-10 DIAGNOSIS — D631 Anemia in chronic kidney disease: Secondary | ICD-10-CM | POA: Diagnosis not present

## 2017-04-10 DIAGNOSIS — E876 Hypokalemia: Secondary | ICD-10-CM | POA: Diagnosis not present

## 2017-04-10 DIAGNOSIS — E1129 Type 2 diabetes mellitus with other diabetic kidney complication: Secondary | ICD-10-CM | POA: Diagnosis not present

## 2017-04-10 DIAGNOSIS — N2581 Secondary hyperparathyroidism of renal origin: Secondary | ICD-10-CM | POA: Diagnosis not present

## 2017-04-11 ENCOUNTER — Encounter (HOSPITAL_COMMUNITY)
Admission: RE | Admit: 2017-04-11 | Discharge: 2017-04-11 | Disposition: A | Discharge status: Home / Self Care | Payer: Medicare Other | Source: Ambulatory Visit | Attending: Pulmonary Disease | Admitting: Pulmonary Disease

## 2017-04-11 VITALS — Wt 139.1 lb

## 2017-04-11 DIAGNOSIS — Z7952 Long term (current) use of systemic steroids: Secondary | ICD-10-CM | POA: Diagnosis not present

## 2017-04-11 DIAGNOSIS — Z942 Lung transplant status: Secondary | ICD-10-CM

## 2017-04-11 DIAGNOSIS — Z7982 Long term (current) use of aspirin: Secondary | ICD-10-CM | POA: Diagnosis not present

## 2017-04-11 DIAGNOSIS — K219 Gastro-esophageal reflux disease without esophagitis: Secondary | ICD-10-CM | POA: Diagnosis not present

## 2017-04-11 DIAGNOSIS — Z794 Long term (current) use of insulin: Secondary | ICD-10-CM | POA: Diagnosis not present

## 2017-04-11 DIAGNOSIS — Z79899 Other long term (current) drug therapy: Secondary | ICD-10-CM | POA: Diagnosis not present

## 2017-04-11 LAB — GLUCOSE, CAPILLARY: Glucose-Capillary: 166 mg/dL — ABNORMAL HIGH (ref 65–99)

## 2017-04-11 NOTE — Addendum Note (Signed)
Encounter addended by: Gaspar Bidding on: 04/11/2017 1:06 PM  Actions taken: Visit Navigator Flowsheet section accepted, Flowsheet accepted, Sign clinical note

## 2017-04-11 NOTE — Progress Notes (Signed)
Daily Session Note  Patient Details  Name: [name] MRN: [id number] Date of Birth: [birth year] Referring Provider:     Pulmonary Rehab Walk Test from 03/14/2017 in [place]  Referring Provider  Dr. Nelda Marseille      Encounter Date: 04/11/2017  Check In: Session Check In - 04/11/17 1042      Check-In   Location  [facility]    Staff Present  Su Hilt, MS, ACSM RCEP, Exercise Physiologist;Joan Leonia Reeves, RN, BSN;Naftoli Penny, RN;Portia Rollene Rotunda, RN, BSN    Supervising physician immediately available to respond to emergencies  [facility] immediately available    Physician(s)  Dr. Broadus John    Medication changes reported      No    Fall or balance concerns reported     No    Tobacco Cessation  No Change    Warm-up and Cool-down  Performed as group-led instruction    Resistance Training Performed  Yes    VAD Patient?  No      Pain Assessment   Currently in Pain?  No/denies    Multiple Pain Sites  No       Capillary Blood Glucose: Results for orders placed or performed during the hospital encounter of 04/11/17 (from the past 24 hour(s))  Glucose, capillary     Status: Abnormal   Collection Time: 04/11/17 11:53 AM  Result Value Ref Range   Glucose-Capillary 166 (H) 65 - 99 mg/dL    Exercise Prescription Changes - 04/11/17 1200      Response to Exercise   Blood Pressure (Admit)  110/60    Blood Pressure (Exercise)  150/66    Blood Pressure (Exit)  124/78    Heart Rate (Admit)  80 bpm    Heart Rate (Exercise)  83 bpm    Heart Rate (Exit)  80 bpm    Oxygen Saturation (Admit)  90 %    Oxygen Saturation (Exercise)  92 %    Oxygen Saturation (Exit)  98 %    Rating of Perceived Exertion (Exercise)  11    Perceived Dyspnea (Exercise)  0    Duration  Progress to 45 minutes of aerobic exercise without signs/symptoms of physical distress    Intensity  THRR unchanged      Progression   Progression  Continue to progress  workloads to maintain intensity without signs/symptoms of physical distress.      Resistance Training   Training Prescription  Yes    Weight  orange bands    Reps  10-15    Time  10 Minutes      Bike   Level  0.4    Minutes  17      NuStep   Level  3    Minutes  17    METs  1.7      Track   Laps  7.5    Minutes  17       Social History   Tobacco Use  Smoking Status Former Smoker  . Packs/day: 0.30  . Years: 20.00  . Pack years: 6.00  . Types: Cigarettes  . Last attempt to quit: 06/06/2002  . Years since quitting: 14.8  Smokeless Tobacco Never Used    Goals Met:  Exercise tolerated well No report of cardiac concerns or symptoms Strength training completed today  Goals Unmet:  Not Applicable  Comments: Service time is from 1030 to 1205    Dr. Rush Farmer is Medical Director  for Pulmonary Rehab at [facility].

## 2017-04-11 NOTE — Progress Notes (Signed)
I have reviewed a Home Exercise Prescription with [name] . Masahiro is not currently exercising at home.  The patient was advised to walk 1 days a week for 30 minutes.  Lasean and I discussed how to progress their exercise prescription.  The patient stated that their goals were to increase stability, leg strength, do more things around the house, be able to manage blood sugars before becoming symptomatic.  The patient stated that they understand the exercise prescription.  We reviewed exercise guidelines, target heart rate during exercise, oxygen use, weather, home pulse oximeter, endpoints for exercise, and goals.  Patient is encouraged to come to me with any questions. I will continue to follow up with the patient to assist them with progression and safety.

## 2017-04-12 DIAGNOSIS — Z418 Encounter for other procedures for purposes other than remedying health state: Secondary | ICD-10-CM | POA: Diagnosis not present

## 2017-04-12 DIAGNOSIS — Z942 Lung transplant status: Secondary | ICD-10-CM | POA: Diagnosis not present

## 2017-04-12 DIAGNOSIS — T86819 Unspecified complication of lung transplant: Secondary | ICD-10-CM | POA: Diagnosis not present

## 2017-04-12 DIAGNOSIS — T8579XA Infection and inflammatory reaction due to other internal prosthetic devices, implants and grafts, initial encounter: Secondary | ICD-10-CM | POA: Diagnosis not present

## 2017-04-12 DIAGNOSIS — N2581 Secondary hyperparathyroidism of renal origin: Secondary | ICD-10-CM | POA: Diagnosis not present

## 2017-04-12 DIAGNOSIS — N189 Chronic kidney disease, unspecified: Secondary | ICD-10-CM | POA: Diagnosis not present

## 2017-04-12 DIAGNOSIS — Z79899 Other long term (current) drug therapy: Secondary | ICD-10-CM | POA: Diagnosis not present

## 2017-04-12 DIAGNOSIS — D631 Anemia in chronic kidney disease: Secondary | ICD-10-CM | POA: Diagnosis not present

## 2017-04-12 DIAGNOSIS — E1129 Type 2 diabetes mellitus with other diabetic kidney complication: Secondary | ICD-10-CM | POA: Diagnosis not present

## 2017-04-12 DIAGNOSIS — E876 Hypokalemia: Secondary | ICD-10-CM | POA: Diagnosis not present

## 2017-04-12 DIAGNOSIS — N186 End stage renal disease: Secondary | ICD-10-CM | POA: Diagnosis not present

## 2017-04-12 DIAGNOSIS — D899 Disorder involving the immune mechanism, unspecified: Secondary | ICD-10-CM | POA: Diagnosis not present

## 2017-04-13 ENCOUNTER — Encounter (HOSPITAL_COMMUNITY)
Admission: RE | Admit: 2017-04-13 | Discharge: 2017-04-13 | Disposition: A | Discharge status: Home / Self Care | Payer: Medicare Other | Source: Ambulatory Visit | Attending: Pulmonary Disease | Admitting: Pulmonary Disease

## 2017-04-13 DIAGNOSIS — Z79899 Other long term (current) drug therapy: Secondary | ICD-10-CM | POA: Diagnosis not present

## 2017-04-13 DIAGNOSIS — Z942 Lung transplant status: Secondary | ICD-10-CM | POA: Diagnosis not present

## 2017-04-13 DIAGNOSIS — Z7952 Long term (current) use of systemic steroids: Secondary | ICD-10-CM | POA: Diagnosis not present

## 2017-04-13 DIAGNOSIS — Z794 Long term (current) use of insulin: Secondary | ICD-10-CM | POA: Diagnosis not present

## 2017-04-13 DIAGNOSIS — K219 Gastro-esophageal reflux disease without esophagitis: Secondary | ICD-10-CM | POA: Diagnosis not present

## 2017-04-13 DIAGNOSIS — Z7982 Long term (current) use of aspirin: Secondary | ICD-10-CM | POA: Diagnosis not present

## 2017-04-13 NOTE — Progress Notes (Signed)
Daily Session Note  Patient Details  Name: [name] MRN: [id number] Date of Birth: 07-Jul-1950 Referring Provider:     Pulmonary Rehab Walk Test from 03/14/2017 in [place]  Referring Provider  Dr. Nelda Marseille      Encounter Date: 04/13/2017  Check In:   Capillary Blood Glucose: No results found for this or any previous visit (from the past 24 hour(s)). POCT Glucose - 04/13/17 1317      POCT Blood Glucose   Pre-Exercise  154 mg/dL    Post-Exercise  103 mg/dL      Exercise Prescription Changes - 04/13/17 1300      Response to Exercise   Blood Pressure (Admit)  100/48    Blood Pressure (Exercise)  102/60    Blood Pressure (Exit)  100/60    Heart Rate (Admit)  81 bpm    Heart Rate (Exercise)  86 bpm    Heart Rate (Exit)  88 bpm    Oxygen Saturation (Admit)  95 %    Oxygen Saturation (Exercise)  92 %    Oxygen Saturation (Exit)  94 %    Rating of Perceived Exertion (Exercise)  13    Perceived Dyspnea (Exercise)  0    Duration  Progress to 45 minutes of aerobic exercise without signs/symptoms of physical distress    Intensity  THRR unchanged      Progression   Progression  Continue to progress workloads to maintain intensity without signs/symptoms of physical distress.      Resistance Training   Training Prescription  Yes    Weight  orange bands    Reps  10-15    Time  10 Minutes      Bike   Level  0.4    Minutes  17      Track   Laps  9.5    Minutes  17       Social History   Tobacco Use  Smoking Status Former Smoker  . Packs/day: 0.30  . Years: 20.00  . Pack years: 6.00  . Types: Cigarettes  . Last attempt to quit: 06/06/2002  . Years since quitting: 14.8  Smokeless Tobacco Never Used    Goals Met:  Exercise tolerated well No report of cardiac concerns or symptoms Strength training completed today  Goals Unmet:  Not Applicable  Comments: Service time is from 10:30a to 12:30p    Dr. Rush Farmer is  Medical Director for Pulmonary Rehab at [facility].

## 2017-04-14 DIAGNOSIS — D631 Anemia in chronic kidney disease: Secondary | ICD-10-CM | POA: Diagnosis not present

## 2017-04-14 DIAGNOSIS — N2581 Secondary hyperparathyroidism of renal origin: Secondary | ICD-10-CM | POA: Diagnosis not present

## 2017-04-14 DIAGNOSIS — E876 Hypokalemia: Secondary | ICD-10-CM | POA: Diagnosis not present

## 2017-04-14 DIAGNOSIS — N186 End stage renal disease: Secondary | ICD-10-CM | POA: Diagnosis not present

## 2017-04-14 DIAGNOSIS — E1129 Type 2 diabetes mellitus with other diabetic kidney complication: Secondary | ICD-10-CM | POA: Diagnosis not present

## 2017-04-14 DIAGNOSIS — T8579XA Infection and inflammatory reaction due to other internal prosthetic devices, implants and grafts, initial encounter: Secondary | ICD-10-CM | POA: Diagnosis not present

## 2017-04-17 DIAGNOSIS — D631 Anemia in chronic kidney disease: Secondary | ICD-10-CM | POA: Diagnosis not present

## 2017-04-17 DIAGNOSIS — T8579XA Infection and inflammatory reaction due to other internal prosthetic devices, implants and grafts, initial encounter: Secondary | ICD-10-CM | POA: Diagnosis not present

## 2017-04-17 DIAGNOSIS — N186 End stage renal disease: Secondary | ICD-10-CM | POA: Diagnosis not present

## 2017-04-17 DIAGNOSIS — E1129 Type 2 diabetes mellitus with other diabetic kidney complication: Secondary | ICD-10-CM | POA: Diagnosis not present

## 2017-04-17 DIAGNOSIS — E876 Hypokalemia: Secondary | ICD-10-CM | POA: Diagnosis not present

## 2017-04-17 DIAGNOSIS — N2581 Secondary hyperparathyroidism of renal origin: Secondary | ICD-10-CM | POA: Diagnosis not present

## 2017-04-18 ENCOUNTER — Encounter (HOSPITAL_COMMUNITY)
Admission: RE | Admit: 2017-04-18 | Discharge: 2017-04-18 | Disposition: A | Discharge status: Home / Self Care | Payer: Medicare Other | Source: Ambulatory Visit | Attending: Pulmonary Disease | Admitting: Pulmonary Disease

## 2017-04-18 VITALS — Wt 140.4 lb

## 2017-04-18 DIAGNOSIS — Z942 Lung transplant status: Secondary | ICD-10-CM | POA: Diagnosis not present

## 2017-04-18 DIAGNOSIS — Z7982 Long term (current) use of aspirin: Secondary | ICD-10-CM | POA: Diagnosis not present

## 2017-04-18 DIAGNOSIS — K219 Gastro-esophageal reflux disease without esophagitis: Secondary | ICD-10-CM | POA: Diagnosis not present

## 2017-04-18 DIAGNOSIS — Z79899 Other long term (current) drug therapy: Secondary | ICD-10-CM | POA: Diagnosis not present

## 2017-04-18 DIAGNOSIS — Z7952 Long term (current) use of systemic steroids: Secondary | ICD-10-CM | POA: Diagnosis not present

## 2017-04-18 DIAGNOSIS — Z794 Long term (current) use of insulin: Secondary | ICD-10-CM | POA: Diagnosis not present

## 2017-04-18 NOTE — Progress Notes (Signed)
Daily Session Note  Patient Details  Name: [name] MRN: [id number] Date of Birth: 09-Nov-1950 Referring Provider:     Pulmonary Rehab Walk Test from 03/14/2017 in [place]  Referring Provider  Dr. Nelda Marseille      Encounter Date: 04/18/2017  Check In: Session Check In - 04/18/17 1240      Check-In   Location  [facility]    Staff Present  Su Hilt, MS, ACSM RCEP, Exercise Physiologist;Joan Leonia Reeves, RN, BSN;Lisa Hughes, RN;Portia Rollene Rotunda, RN, BSN    Supervising physician immediately available to respond to emergencies  [facility] immediately available    Physician(s)  Dr. Maylene Roes    Medication changes reported      No    Fall or balance concerns reported     No    Tobacco Cessation  No Change    Warm-up and Cool-down  Performed as group-led instruction    Resistance Training Performed  Yes    VAD Patient?  No      Pain Assessment   Currently in Pain?  No/denies    Multiple Pain Sites  No       Capillary Blood Glucose: No results found for this or any previous visit (from the past 24 hour(s)). POCT Glucose - 04/18/17 1254      POCT Blood Glucose   Pre-Exercise  139 mg/dL    Post-Exercise  119 mg/dL      Exercise Prescription Changes - 04/18/17 1200      Response to Exercise   Blood Pressure (Admit)  104/60    Blood Pressure (Exercise)  126/70    Blood Pressure (Exit)  100/72    Heart Rate (Admit)  74 bpm    Heart Rate (Exercise)  95 bpm    Heart Rate (Exit)  82 bpm    Oxygen Saturation (Admit)  92 %    Oxygen Saturation (Exercise)  89 %    Oxygen Saturation (Exit)  90 %    Rating of Perceived Exertion (Exercise)  11    Perceived Dyspnea (Exercise)  0    Duration  Progress to 45 minutes of aerobic exercise without signs/symptoms of physical distress    Intensity  THRR unchanged      Progression   Progression  Continue to progress workloads to maintain intensity without signs/symptoms of physical  distress.      Resistance Training   Training Prescription  Yes    Weight  orange bands    Reps  10-15    Time  10 Minutes      Bike   Level  0.4    Minutes  17      NuStep   Level  4    Minutes  17    METs  2.6      Track   Laps  9    Minutes  17       Social History   Tobacco Use  Smoking Status Former Smoker  . Packs/day: 0.30  . Years: 20.00  . Pack years: 6.00  . Types: Cigarettes  . Last attempt to quit: 06/06/2002  . Years since quitting: 14.8  Smokeless Tobacco Never Used    Goals Met:  Exercise tolerated well No report of cardiac concerns or symptoms Strength training completed today  Goals Unmet:  Not Applicable  Comments: Service time is from 10:30a to 12:15p    Dr. Rush Farmer is Medical Director for Pulmonary Rehab at [facility].

## 2017-04-19 DIAGNOSIS — E1129 Type 2 diabetes mellitus with other diabetic kidney complication: Secondary | ICD-10-CM | POA: Diagnosis not present

## 2017-04-19 DIAGNOSIS — T8579XA Infection and inflammatory reaction due to other internal prosthetic devices, implants and grafts, initial encounter: Secondary | ICD-10-CM | POA: Diagnosis not present

## 2017-04-19 DIAGNOSIS — N186 End stage renal disease: Secondary | ICD-10-CM | POA: Diagnosis not present

## 2017-04-19 DIAGNOSIS — N2581 Secondary hyperparathyroidism of renal origin: Secondary | ICD-10-CM | POA: Diagnosis not present

## 2017-04-19 DIAGNOSIS — E876 Hypokalemia: Secondary | ICD-10-CM | POA: Diagnosis not present

## 2017-04-19 DIAGNOSIS — D631 Anemia in chronic kidney disease: Secondary | ICD-10-CM | POA: Diagnosis not present

## 2017-04-20 ENCOUNTER — Encounter (HOSPITAL_COMMUNITY)
Admission: RE | Admit: 2017-04-20 | Discharge: 2017-04-20 | Disposition: A | Discharge status: Home / Self Care | Payer: Medicare Other | Source: Ambulatory Visit | Attending: Pulmonary Disease | Admitting: Pulmonary Disease

## 2017-04-20 ENCOUNTER — Encounter (HOSPITAL_COMMUNITY)
Admission: RE | Admit: 2017-04-20 | Discharge: 2017-04-20 | Disposition: A | Discharge status: Home / Self Care | Payer: Medicare Other | Source: Ambulatory Visit

## 2017-04-20 VITALS — Wt 141.3 lb

## 2017-04-20 DIAGNOSIS — Z7982 Long term (current) use of aspirin: Secondary | ICD-10-CM | POA: Diagnosis not present

## 2017-04-20 DIAGNOSIS — K219 Gastro-esophageal reflux disease without esophagitis: Secondary | ICD-10-CM | POA: Diagnosis not present

## 2017-04-20 DIAGNOSIS — Z942 Lung transplant status: Secondary | ICD-10-CM

## 2017-04-20 DIAGNOSIS — Z794 Long term (current) use of insulin: Secondary | ICD-10-CM | POA: Diagnosis not present

## 2017-04-20 DIAGNOSIS — Z7952 Long term (current) use of systemic steroids: Secondary | ICD-10-CM | POA: Diagnosis not present

## 2017-04-20 DIAGNOSIS — Z79899 Other long term (current) drug therapy: Secondary | ICD-10-CM | POA: Diagnosis not present

## 2017-04-20 NOTE — Progress Notes (Signed)
Pulmonary Individual Treatment Plan  Patient Details  Name: [name] MRN: [id number] Date of Birth: May 09, 1951 Referring Provider:     Pulmonary Rehab Walk Test from 03/14/2017 in [place]  Referring Provider  Dr. Nelda Marseille      Initial Encounter Date:    Pulmonary Rehab Walk Test from 03/14/2017 in [place]  Date  03/16/17  Referring Provider  Dr. Nelda Marseille      Visit Diagnosis: Status post lung transplantation [facility])  Patient's Home Medications on Admission:   Current Outpatient Medications:  .  ACCU-CHEK FASTCLIX LANCETS MISC, As directed up to 4 times daily, Disp: , Rfl: 3 .  acetaminophen (TYLENOL) 500 MG tablet, Take 500-1,000 mg by mouth every 6 (six) hours as needed for headache., Disp: , Rfl:  .  aspirin 81 MG tablet, Take 81 mg by mouth daily. , Disp: , Rfl:  .  azaTHIOprine (IMURAN) 50 MG tablet, Take 1/2 tablet (25 mg) every Monday, Wednesday and Friday after HD., Disp: , Rfl:  .  calcium acetate (PHOSLO) 667 MG capsule, Take 1,334 mg by mouth 3 (three) times daily before meals. , Disp: , Rfl:  .  cycloSPORINE modified (NEORAL) 25 MG capsule, Take 75 mg by mouth 2 (two) times daily. , Disp: , Rfl:  .  cycloSPORINE modified (NEORAL) 25 MG capsule, Take by mouth., Disp: , Rfl:  .  doxercalciferol (HECTOROL) 4 MCG/2ML injection, Inject into the vein., Disp: , Rfl:  .  fluticasone (FLONASE) 50 MCG/ACT nasal spray, Place 2 sprays into the nose daily. (Patient taking differently: Place 2 sprays into the nose daily as needed for allergies. ), Disp: 30 g, Rfl: 2 .  hydrOXYzine (ATARAX/VISTARIL) 25 MG tablet, Take by mouth., Disp: , Rfl:  .  insulin regular (NOVOLIN R) 100 units/mL injection, Inject 5 Units into the skin 3 (three) times daily before meals. Additional units (per sliding scale): BGL 201-250 = 1 unit; 251-300 = 2 units; 301-350 = 3 units; 351-400 = units; >401 = 5 units + CALL LUNG COORDINATOR @  [facility], Disp: , Rfl:  .  insulin regular (NOVOLIN R,HUMULIN R) 100 units/mL injection, 201 - 250 mg/dL 1 units, 251 - 300 mg/dL 2 units, 301 - 350 mg/dL 3 units, >350 mg/dL Notify Provider, Disp: , Rfl:  .  latanoprost (XALATAN) 0.005 % ophthalmic solution, Place 1 drop into both eyes at bedtime., Disp: , Rfl:  .  metoCLOPramide (REGLAN) 5 MG tablet, Take by mouth., Disp: , Rfl:  .  midodrine (PROAMATINE) 10 MG tablet, Take by mouth., Disp: , Rfl:  .  Multiple Vitamin (MULTIVITAMIN WITH MINERALS) TABS tablet, Take 1 tablet by mouth daily., Disp: , Rfl:  .  omeprazole (PRILOSEC) 20 MG capsule, Take by mouth., Disp: , Rfl:  .  ondansetron (ZOFRAN) 4 MG tablet, Take 4 mg by mouth every 8 (eight) hours as needed for nausea or vomiting. , Disp: , Rfl:  .  polyvinyl alcohol-povidone (REFRESH) 1.4-0.6 % ophthalmic solution, Place 1-2 drops into both eyes daily as needed (for dryness)., Disp: , Rfl:  .  pravastatin (PRAVACHOL) 20 MG tablet, Take 20 mg by mouth every evening. , Disp: , Rfl:  .  predniSONE (DELTASONE) 5 MG tablet, Take by mouth., Disp: , Rfl:  .  sertraline (ZOLOFT) 50 MG tablet, Take 50 mg by mouth daily., Disp: , Rfl:  .  sulfamethoxazole-trimethoprim (BACTRIM,SEPTRA) 400-80 MG tablet, Take 1 tablet by mouth 3 (three) times a week. Monday,  Wednesday, Friday, Disp: , Rfl:  .  valGANciclovir (VALCYTE) 450 MG tablet, Take 1 tablet by mouth See admin instructions. Every Monday and Friday AFTER DIALYSIS, Disp: , Rfl:   Past Medical History: Past Medical History:  Diagnosis Date  . Aortic valve disorders   . Benign neoplasm of colon   . Degeneration of intervertebral disc, site unspecified   . Diabetes mellitus without complication ([place])   . 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 Esophageal reflux   . ESRD (end stage renal disease) on dialysis ([place])   . Essential hypertension   . Obstructive sleep apnea (adult) (pediatric)   . Osteoarthrosis, unspecified  whether generalized or localized, unspecified site   . Other and unspecified hyperlipidemia   . Pneumonia    interstitial pneumonia  . Pulmonary fibrosis ([place])   . Renal disorder   . Respiratory failure with hypoxia ([place]) 12/2015  . Shortness of breath dyspnea   . Transplanted, lung ([place])   . Unspecified essential hypertension     Tobacco Use: Social History   Tobacco Use  Smoking Status Former Smoker  . Packs/day: 0.30  . Years: 20.00  . Pack years: 6.00  . Types: Cigarettes  . Last attempt to quit: 06/06/2002  . Years since quitting: 14.8  Smokeless Tobacco Never Used    Labs: Recent Chemical engineer    Labs for ITP Cardiac and Pulmonary Rehab Latest Ref Rng & Units 04/19/2015 12/30/2015 01/01/2016 01/01/2016 01/01/2016   Hemoglobin A1c <5.7 % - - - - -   PHART 7.350 - 7.450 7.299(L) 7.368 7.458(H) 7.393 7.381   PCO2ART 35.0 - 45.0 mmHg 40.5 39.6 37.2 45.2(H) 42.4   HCO3 20.0 - 24.0 mEq/L 19.3(L) 22.7 26.0(H) 27.1(H) 24.7(H)   TCO2 0 - 100 mmol/L 20.5 24 27._0 ACIDBASEDEF 0.0 - 2.0 mmol/L 6.0(H) 2.0 - - -   O2SAT % - 91.0 91.1 100.0 94.0      Capillary Blood Glucose: Lab Results  Component Value Date   GLUCAP 166 (H) 04/11/2017   GLUCAP 144 (H) 04/06/2017   GLUCAP 122 (H) 01/01/2016   GLUCAP 82 01/01/2016   GLUCAP 83 01/01/2016   POCT Glucose    Row Name 03/21/17 1226 03/23/17 1245 03/28/17 1213 04/04/17 1156 04/06/17 1245     POCT Blood Glucose   Pre-Exercise  143 mg/dL  250 mg/dL  143 mg/dL  108 mg/dL  127 mg/dL   Post-Exercise  75 mg/dL  179 mg/dL  113 mg/dL  86 mg/dL  144 mg/dL   Post-Exercise #2  68 mg/dL  -  -  -  -   Post-Exercise #3  88 mg/dL  -  -  -  -   Row Name 04/11/17 1222 04/13/17 1317 04/18/17 1254 04/20/17 1233       POCT Blood Glucose   Pre-Exercise  151 mg/dL  154 mg/dL  139 mg/dL  179 mg/dL    Post-Exercise  166 mg/dL  103 mg/dL  119 mg/dL  123 mg/dL       Pulmonary Assessment Scores: Pulmonary Assessment Scores    Row  Name 03/14/17 1354 03/16/17 0724       ADL UCSD   ADL Phase  Entry  Entry    SOB Score total  30  -      CAT Score   CAT Score  8 Entry  -      mMRC Score   mMRC Score  -  1  Pulmonary Function Assessment: Pulmonary Function Assessment - 03/13/17 0941      Breath   Bilateral Breath Sounds  Other    Other  right lung clear but deminished throughout, left upper clear, fine crackles mid to lower base    Shortness of Breath  No       Exercise Target Goals:    Exercise Program Goal: Individual exercise prescription set with THRR, safety & activity barriers. Participant demonstrates ability to understand and report RPE using BORG scale, to self-measure pulse accurately, and to acknowledge the importance of the exercise prescription.  Exercise Prescription Goal: Starting with aerobic activity 30 plus minutes a day, 3 days per week for initial exercise prescription. Provide home exercise prescription and guidelines that participant acknowledges understanding prior to discharge.  Activity Barriers & Risk Stratification: Activity Barriers & Cardiac Risk Stratification - 03/13/17 0921      Activity Barriers & Cardiac Risk Stratification   Activity Barriers  Deconditioning;Balance Concerns       6 Minute Walk: 6 Minute Walk    Row Name 03/16/17 0716         6 Minute Walk   Phase  Initial     Distance  1000 feet     Walk Time  6 minutes     # of Rest Breaks  0     MPH  1.89     METS  2.45     RPE  12     Perceived Dyspnea   0     Symptoms  Yes (comment)     Comments  5/10 calf pain     Resting HR  77 bpm     Resting BP  105/62     Resting Oxygen Saturation   93 %     Exercise Oxygen Saturation  during 6 min walk  88 %     Max Ex. HR  91 bpm     Max Ex. BP  182/79     2 Minute Post BP  173/82 159/80       Interval HR   1 Minute HR  77     2 Minute HR  82     3 Minute HR  84     4 Minute HR  89     5 Minute HR  90     6 Minute HR  91     Interval Heart  Rate?  Yes       Interval Oxygen   Interval Oxygen?  Yes     Baseline Oxygen Saturation %  93 %     1 Minute Oxygen Saturation %  93 %     1 Minute Liters of Oxygen  0 L     2 Minute Oxygen Saturation %  89 %     2 Minute Liters of Oxygen  0 L     3 Minute Oxygen Saturation %  89 %     3 Minute Liters of Oxygen  0 L     4 Minute Oxygen Saturation %  90 %     4 Minute Liters of Oxygen  0 L     5 Minute Oxygen Saturation %  88 %     5 Minute Liters of Oxygen  0 L     6 Minute Oxygen Saturation %  90 %     6 Minute Liters of Oxygen  0 L        Oxygen Initial Assessment: Oxygen Initial Assessment -  03/16/17 0724      Initial 6 min Walk   Oxygen Used  None      Program Oxygen Prescription   Program Oxygen Prescription  None       Oxygen Re-Evaluation: Oxygen Re-Evaluation    [place] Name 03/26/17 2135 04/17/17 1608           Program Oxygen Prescription   Program Oxygen Prescription  None  None        Home Oxygen   Home Oxygen Device  None  None      Sleep Oxygen Prescription  CPAP  CPAP      Home Exercise Oxygen Prescription  None  None      Home at Rest Exercise Oxygen Prescription  None  None      Compliance with Home Oxygen Use  No  No        Goals/Expected Outcomes   Comments  MD recently ordered an overnight oximetry to determine if patient still need CPAP since lung transplant and significant weight loss  patient awaiting CPAP results         Oxygen Discharge (Final Oxygen Re-Evaluation): Oxygen Re-Evaluation - 04/17/17 1608      Program Oxygen Prescription   Program Oxygen Prescription  None      Home Oxygen   Home Oxygen Device  None    Sleep Oxygen Prescription  CPAP    Home Exercise Oxygen Prescription  None    Home at Rest Exercise Oxygen Prescription  None    Compliance with Home Oxygen Use  No      Goals/Expected Outcomes   Comments  patient awaiting CPAP results       Initial Exercise Prescription: Initial Exercise Prescription - 03/16/17  0700      Date of Initial Exercise RX and Referring Provider   Date  03/16/17    Referring Provider  Dr. Nelda Marseille      Bike   Level  0.4    Minutes  17      NuStep   Level  2    Minutes  17    METs  1.4      Track   Laps  8    Minutes  17      Prescription Details   Frequency (times per week)  2    Duration  Progress to 45 minutes of aerobic exercise without signs/symptoms of physical distress      Intensity   THRR 40-80% of Max Heartrate  61-122    Ratings of Perceived Exertion  11-13    Perceived Dyspnea  0-4      Progression   Progression  Continue progressive overload as per policy without signs/symptoms or physical distress.      Resistance Training   Training Prescription  Yes    Weight  orange bands    Reps  10-15       Perform Capillary Blood Glucose checks as needed.  Exercise Prescription Changes: Exercise Prescription Changes    Row Name 03/21/17 1228 03/23/17 1200 03/28/17 1200 03/30/17 1200 04/04/17 1154     Response to Exercise   Blood Pressure (Admit)  112/72  110/70  140/60  104/54  122/70   Blood Pressure (Exercise)  132/88  100/70  130/70  110/60  138/54   Blood Pressure (Exit)  100/66  130/86  112/70  100/64  98/56   Heart Rate (Admit)  74 bpm  77 bpm  78 bpm  77 bpm  75 bpm  Heart Rate (Exercise)  80 bpm  94 bpm  92 bpm  83 bpm  82 bpm   Heart Rate (Exit)  76 bpm  83 bpm  80 bpm  80 bpm  79 bpm   Oxygen Saturation (Admit)  95 %  96 %  95 %  92 %  95 %   Oxygen Saturation (Exercise)  88 %  89 %  88 %  92 %  90 %   Oxygen Saturation (Exit)  76 %  91 %  95 %  96 %  94 %   Rating of Perceived Exertion (Exercise)  _0 Perceived Dyspnea (Exercise)  0  0  _1 Duration  Progress to 45 minutes of aerobic exercise without signs/symptoms of physical distress  Progress to 45 minutes of aerobic exercise without signs/symptoms of physical distress  Progress to 45 minutes of aerobic exercise without signs/symptoms of physical distress   Progress to 45 minutes of aerobic exercise without signs/symptoms of physical distress  Progress to 45 minutes of aerobic exercise without signs/symptoms of physical distress   Intensity  Other (comment) 40-80% HRR  Other (comment) 40-80% HRR  Other (comment) 40-80% HRR  Other (comment) 40-80% HRR  Other (comment) 40-80% HRR     Progression   Progression  Continue to progress workloads to maintain intensity without signs/symptoms of physical distress.  Continue to progress workloads to maintain intensity without signs/symptoms of physical distress.  Continue to progress workloads to maintain intensity without signs/symptoms of physical distress.  Continue to progress workloads to maintain intensity without signs/symptoms of physical distress.  Continue to progress workloads to maintain intensity without signs/symptoms of physical distress.     Resistance Training   Training Prescription  Yes  Yes  Yes  Yes  Yes   Weight  orange bands  orange bands  orange bands  orange bands  orange bands   Reps  10-15  10-15  10-15  10-15  10-15     Oxygen   Oxygen  - Room air  - Room air  - Room air  - Room air  - Room air     Bike   Level  0.4  -  0.4  0.4  0.4   Minutes  17  -  _2 NuStep   Level  _3 -  2   Minutes  _4 -  17   METs  1.4  1.9  1.8  -  1.7     Track   Laps  -  7.5  8  -  -   Minutes  -  17  17  -  -   Row Name 04/06/17 1200 04/11/17 1200 04/13/17 1300 04/18/17 1200 04/20/17 1200     Response to Exercise   Blood Pressure (Admit)  108/54  110/60  100/48  104/60  150/80   Blood Pressure (Exercise)  154/74  150/66  102/60  126/70  144/88   Blood Pressure (Exit)  104/70  124/78  100/60  100/72  154/70   Heart Rate (Admit)  75 bpm  80 bpm  81 bpm  74 bpm  83 bpm   Heart Rate (Exercise)  80 bpm  83 bpm  86 bpm  95 bpm  83 bpm   Heart Rate (Exit)  78 bpm  80 bpm  88 bpm  82 bpm  81 bpm   Oxygen Saturation (Admit)  91 %  90 %  95 %  92 %  93 %   Oxygen  Saturation (Exercise)  94 %  92 %  92 %  89 %  92 %   Oxygen Saturation (Exit)  97 %  98 %  94 %  90 %  93 %   Rating of Perceived Exertion (Exercise)  _0 Perceived Dyspnea (Exercise)  0  0  0  0  0   Duration  Progress to 45 minutes of aerobic exercise without signs/symptoms of physical distress  Progress to 45 minutes of aerobic exercise without signs/symptoms of physical distress  Progress to 45 minutes of aerobic exercise without signs/symptoms of physical distress  Progress to 45 minutes of aerobic exercise without signs/symptoms of physical distress  Progress to 45 minutes of aerobic exercise without signs/symptoms of physical distress   Intensity  Other (comment) 40-80% HRR  THRR unchanged  THRR unchanged  THRR unchanged  THRR unchanged     Progression   Progression  Continue to progress workloads to maintain intensity without signs/symptoms of physical distress.  Continue to progress workloads to maintain intensity without signs/symptoms of physical distress.  Continue to progress workloads to maintain intensity without signs/symptoms of physical distress.  Continue to progress workloads to maintain intensity without signs/symptoms of physical distress.  Continue to progress workloads to maintain intensity without signs/symptoms of physical distress.     Resistance Training   Training Prescription  Yes  Yes  Yes  Yes  Yes   Weight  orange bands  orange bands  orange bands  orange bands  orange bands   Reps  10-15  10-15  10-15  10-15  10-15   Time  -  10 Minutes  10 Minutes  10 Minutes  10 Minutes     Oxygen   Oxygen  - Room air  -  -  -  -     Bike   Level  -  0.4  0.4  0.4  0.4   Minutes  -  _1 NuStep   Level  3  3  -  4  4   Minutes  17  17  -  17  17   METs  1.6  1.7  -  2.6  2.1     Track   Laps  7  7.5  9.5  9  -   Minutes  _2 -     Home Exercise Plan   Plans to continue exercise at  -  Home (comment)  -  -  -   Frequency  -   Add 1 additional day to program exercise sessions.  -  -  -      Exercise Comments: Exercise Comments    Row Name 04/11/17 1259           Exercise Comments  home exercise completed          Exercise Goals and Review: Exercise Goals    Row Name 03/13/17 0921             Exercise Goals   Increase Physical Activity  Yes       Intervention  Provide advice, education, support and counseling about physical activity/exercise needs.;Develop an individualized  exercise prescription for aerobic and resistive training based on initial evaluation findings, risk stratification, comorbidities and participant's personal goals.       Expected Outcomes  Achievement of increased cardiorespiratory fitness and enhanced flexibility, muscular endurance and strength shown through measurements of functional capacity and personal statement of participant.       Increase Strength and Stamina  Yes       Intervention  Provide advice, education, support and counseling about physical activity/exercise needs.;Develop an individualized exercise prescription for aerobic and resistive training based on initial evaluation findings, risk stratification, comorbidities and participant's personal goals.       Expected Outcomes  Achievement of increased cardiorespiratory fitness and enhanced flexibility, muscular endurance and strength shown through measurements of functional capacity and personal statement of participant.       Able to understand and use rate of perceived exertion (RPE) scale  Yes       Intervention  Provide education and explanation on how to use RPE scale       Expected Outcomes  Short Term: Able to use RPE daily in rehab to express subjective intensity level;Long Term:  Able to use RPE to guide intensity level when exercising independently       Able to understand and use Dyspnea scale  Yes       Intervention  Provide education and explanation on how to use Dyspnea scale       Expected Outcomes   Short Term: Able to use Dyspnea scale daily in rehab to express subjective sense of shortness of breath during exertion;Long Term: Able to use Dyspnea scale to guide intensity level when exercising independently       Knowledge and understanding of Target Heart Rate Range (THRR)  Yes       Intervention  Provide education and explanation of THRR including how the numbers were predicted and where they are located for reference       Expected Outcomes  Short Term: Able to state/look up THRR;Long Term: Able to use THRR to govern intensity when exercising independently;Short Term: Able to use daily as guideline for intensity in rehab       Understanding of Exercise Prescription  Yes       Intervention  Provide education, explanation, and written materials on patient's individual exercise prescription       Expected Outcomes  Short Term: Able to explain program exercise prescription;Long Term: Able to explain home exercise prescription to exercise independently          Exercise Goals Re-Evaluation : Exercise Goals Re-Evaluation    Row Name 03/27/17 1012 04/20/17 0952           Exercise Goal Re-Evaluation   Exercise Goals Review  Increase Physical Activity;Increase Strength and Stamina;Able to understand and use Dyspnea scale;Able to understand and use rate of perceived exertion (RPE) scale;Knowledge and understanding of Target Heart Rate Range (THRR);Understanding of Exercise Prescription  Increase Strength and Stamina;Increase Physical Activity;Able to understand and use rate of perceived exertion (RPE) scale;Understanding of Exercise Prescription;Knowledge and understanding of Target Heart Rate Range (THRR);Able to understand and use Dyspnea scale      Comments  Patient has only attended 2 exercise sessions. Will cont. to monitor and progress as able.   Patient is progressing well. Will start to increase intensity now that the patient has been here for a few weeks. Will cont. to monitor and progress  as able.       Expected Outcomes  Through exercise at rehab  and at home, patient will increase strength and stamina and decrease shortness of breath  Through exercise at rehab and at home, patient will increase strength and stamina and also be able to perform ADL's easier.         Discharge Exercise Prescription (Final Exercise Prescription Changes): Exercise Prescription Changes - 04/20/17 1200      Response to Exercise   Blood Pressure (Admit)  150/80    Blood Pressure (Exercise)  144/88    Blood Pressure (Exit)  154/70    Heart Rate (Admit)  83 bpm    Heart Rate (Exercise)  83 bpm    Heart Rate (Exit)  81 bpm    Oxygen Saturation (Admit)  93 %    Oxygen Saturation (Exercise)  92 %    Oxygen Saturation (Exit)  93 %    Rating of Perceived Exertion (Exercise)  9    Perceived Dyspnea (Exercise)  0    Duration  Progress to 45 minutes of aerobic exercise without signs/symptoms of physical distress    Intensity  THRR unchanged      Progression   Progression  Continue to progress workloads to maintain intensity without signs/symptoms of physical distress.      Resistance Training   Training Prescription  Yes    Weight  orange bands    Reps  10-15    Time  10 Minutes      Bike   Level  0.4    Minutes  17      NuStep   Level  4    Minutes  17    METs  2.1       Nutrition:  Target Goals: Understanding of nutrition guidelines, daily intake of sodium <1522m, cholesterol <2085m calories 30% from fat and 7% or less from saturated fats, daily to have 5 or more servings of fruits and vegetables.  Biometrics: Pre Biometrics - 03/13/17 0922      Pre Biometrics   Grip Strength  20 kg        Nutrition Therapy Plan and Nutrition Goals: Nutrition Therapy & Goals - 03/30/17 1347      Nutrition Therapy   Diet  Renal, Diabetic      Personal Nutrition Goals   Nutrition Goal  Pt to maintain his current wt while in Pulmonary Rehab.      Intervention Plan   Intervention   Prescribe, educate and counsel regarding individualized specific dietary modifications aiming towards targeted core components such as weight, hypertension, lipid management, diabetes, heart failure and other comorbidities.    Expected Outcomes  Short Term Goal: Understand basic principles of dietary content, such as calories, fat, sodium, cholesterol and nutrients.;Long Term Goal: Adherence to prescribed nutrition plan.       Nutrition Discharge: Rate Your Plate Scores: Nutrition Assessments - 03/16/17 1453      Rate Your Plate Scores   Pre Score  55       Nutrition Goals Re-Evaluation:   Nutrition Goals Discharge (Final Nutrition Goals Re-Evaluation):   Psychosocial: Target Goals: Acknowledge presence or absence of significant depression and/or stress, maximize coping skills, provide positive support system. Participant is able to verbalize types and ability to use techniques and skills needed for reducing stress and depression.  Initial Review & Psychosocial Screening: Initial Psych Review & Screening - 03/13/17 0942      Initial Review   Current issues with  None Identified      Family Dynamics   Good Support System?  Yes  Barriers   Psychosocial barriers to participate in program  There are no identifiable barriers or psychosocial needs.      Screening Interventions   Interventions  Encouraged to exercise       Quality of Life Scores:   PHQ-9: Recent Review Flowsheet Data    Depression screen [facility] 2/9 03/13/2017 08/21/2015 07/22/2014 05/09/2014 12/03/2012   Decreased Interest 0 0 0 0 0   Down, Depressed, Hopeless 0 0 0 0 0   PHQ - 2 Score 0 0 0 0 0     Interpretation of Total Score  Total Score Depression Severity:  1-4 = Minimal depression, 5-9 = Mild depression, 10-14 = Moderate depression, 15-19 = Moderately severe depression, 20-27 = Severe depression   Psychosocial Evaluation and Intervention: Psychosocial Evaluation - 03/13/17 0943      Psychosocial  Evaluation & Interventions   Interventions  Encouraged to exercise with the program and follow exercise prescription    Expected Outcomes  patient will remain free from psysocial barriers to participate in pulmonary rehab    Continue Psychosocial Services   No Follow up required       Psychosocial Re-Evaluation: Psychosocial Re-Evaluation    [place] Name 03/26/17 2137 04/17/17 1614           Psychosocial Re-Evaluation   Current issues with  None Identified  None Identified      Expected Outcomes  patient will remain free from psychosocial barriers to participation  patient will remain free from psychosocial barriers to participation      Interventions  Encouraged to attend Pulmonary Rehabilitation for the exercise  Encouraged to attend Pulmonary Rehabilitation for the exercise      Continue Psychosocial Services   No Follow up required  No Follow up required         Psychosocial Discharge (Final Psychosocial Re-Evaluation): Psychosocial Re-Evaluation - 04/17/17 1614      Psychosocial Re-Evaluation   Current issues with  None Identified    Expected Outcomes  patient will remain free from psychosocial barriers to participation    Interventions  Encouraged to attend Pulmonary Rehabilitation for the exercise    Continue Psychosocial Services   No Follow up required       Education: Education Goals: Education classes will be provided on a weekly basis, covering required topics. Participant will state understanding/return demonstration of topics presented.  Learning Barriers/Preferences: Learning Barriers/Preferences - 03/13/17 0940      Learning Barriers/Preferences   Learning Barriers  None    Learning Preferences  Individual Instruction;Skilled Demonstration;Written Material;Verbal Instruction       Education Topics: Risk Factor Reduction:  -Group instruction that is supported by a PowerPoint presentation. Instructor discusses the definition of a risk factor, different risk  factors for pulmonary disease, and how the heart and lungs work together.     Nutrition for Pulmonary Patient:  -Group instruction provided by PowerPoint slides, verbal discussion, and written materials to support subject matter. The instructor gives an explanation and review of healthy diet recommendations, which includes a discussion on weight management, recommendations for fruit and vegetable consumption, as well as protein, fluid, caffeine, fiber, sodium, sugar, and alcohol. Tips for eating when patients are short of breath are discussed.   Pursed Lip Breathing:  -Group instruction that is supported by demonstration and informational handouts. Instructor discusses the benefits of pursed lip and diaphragmatic breathing and detailed demonstration on how to preform both.     Oxygen Safety:  -Group instruction provided by PowerPoint, verbal discussion, and  written material to support subject matter. There is an overview of "What is Oxygen" and "Why do we need it".  Instructor also reviews how to create a safe environment for oxygen use, the importance of using oxygen as prescribed, and the risks of noncompliance. There is a brief discussion on traveling with oxygen and resources the patient may utilize.   Oxygen Equipment:  -Group instruction provided by [facility] Staff utilizing handouts, written materials, and equipment demonstrations.   Signs and Symptoms:  -Group instruction provided by written material and verbal discussion to support subject matter. Warning signs and symptoms of infection, stroke, and heart attack are reviewed and when to call the physician/911 reinforced. Tips for preventing the spread of infection discussed.   Advanced Directives:  -Group instruction provided by verbal instruction and written material to support subject matter. Instructor reviews Advanced Directive laws and proper instruction for filling out document.   Pulmonary Video:  -Group video education  that reviews the importance of medication and oxygen compliance, exercise, good nutrition, pulmonary hygiene, and pursed lip and diaphragmatic breathing for the pulmonary patient.   Exercise for the Pulmonary Patient:  -Group instruction that is supported by a PowerPoint presentation. Instructor discusses benefits of exercise, core components of exercise, frequency, duration, and intensity of an exercise routine, importance of utilizing pulse oximetry during exercise, safety while exercising, and options of places to exercise outside of rehab.     PULMONARY REHAB OTHER RESPIRATORY from 04/20/2017 in [place]  Date  04/20/17  Educator  Cloyde Reams  Instruction Review Code  2- meets goals/outcomes      Pulmonary Medications:  -Verbally interactive group education provided by instructor with focus on inhaled medications and proper administration.   PULMONARY REHAB OTHER RESPIRATORY from 04/06/2017 in [place]  Date  04/06/17  Educator  Pharm D  Instruction Review Code  2- meets goals/outcomes      Anatomy and Physiology of the Respiratory System and Intimacy:  -Group instruction provided by PowerPoint, verbal discussion, and written material to support subject matter. Instructor reviews respiratory cycle and anatomical components of the respiratory system and their functions. Instructor also reviews differences in obstructive and restrictive respiratory diseases with examples of each. Intimacy, Sex, and Sexuality differences are reviewed with a discussion on how relationships can change when diagnosed with pulmonary disease. Common sexual concerns are reviewed.   MD DAY -A group question and answer session with a medical doctor that allows participants to ask questions that relate to their pulmonary disease state.   OTHER EDUCATION -Group or individual verbal, written, or video instructions that support the educational goals of  the pulmonary rehab program.   Knowledge Questionnaire Score: Knowledge Questionnaire Score - 03/14/17 1353      Knowledge Questionnaire Score   Pre Score  9/13       Core Components/Risk Factors/Patient Goals at Admission: Personal Goals and Risk Factors at Admission - 03/13/17 0942      Core Components/Risk Factors/Patient Goals on Admission   Improve shortness of breath with ADL's  Yes    Intervention  Provide education, individualized exercise plan and daily activity instruction to help decrease symptoms of SOB with activities of daily living.    Expected Outcomes  Short Term: Achieves a reduction of symptoms when performing activities of daily living.       Core Components/Risk Factors/Patient Goals Review:  Goals and Risk Factor Review    Row Name 03/26/17 2136 04/17/17 1608  Core Components/Risk Factors/Patient Goals Review   Personal Goals Review  Improve shortness of breath with ADL's  Improve shortness of breath with ADL's      Review  patient has only attended 2 exercise sessions since admission. Too early to evaluate progress towards rehab goals. should see progression over the next 30 days  patient is working hard in pulmonary rehab. he is tolerating workload increases and verbalizes and improvement in his shortness of breath. his bloodsugars are an issue most days and we are trying to find a good combination of the right amount of medications and healthy breakfast/snack to keep his bloodsugars up during exercise. He is working with our diabetes educator on this. He continues to go to dialysis M-W-F which limits his ability to exercise at home on those days.       Expected Outcomes  see admission goals  see admission goals         Core Components/Risk Factors/Patient Goals at Discharge (Final Review):  Goals and Risk Factor Review - 04/17/17 1608      Core Components/Risk Factors/Patient Goals Review   Personal Goals Review  Improve shortness of breath  with ADL's    Review  patient is working hard in pulmonary rehab. he is tolerating workload increases and verbalizes and improvement in his shortness of breath. his bloodsugars are an issue most days and we are trying to find a good combination of the right amount of medications and healthy breakfast/snack to keep his bloodsugars up during exercise. He is working with our diabetes educator on this. He continues to go to dialysis M-W-F which limits his ability to exercise at home on those days.     Expected Outcomes  see admission goals       ITP Comments:   Comments: ITP REVIEW Pt is making expected progress toward pulmonary rehab goals after completing 10 sessions. Recommend continued exercise, life style modification, education, and utilization of breathing techniques to increase stamina and strength and decrease shortness of breath with exertion.

## 2017-04-20 NOTE — Progress Notes (Signed)
Daily Session Note  Patient Details  Name: [name] MRN: [id number] Date of Birth: 25-Feb-1951 Referring Provider:     Pulmonary Rehab Walk Test from 03/14/2017 in [place]  Referring Provider  Dr. Nelda Marseille      Encounter Date: 04/20/2017  Check In: Session Check In - 04/20/17 1030      Check-In   Location  [facility]    Staff Present  Rosebud Poles, RN, BSN;Elmyra Banwart, MS, ACSM RCEP, Exercise Physiologist;Lisa Ysidro Evert, RN;Portia Rollene Rotunda, RN, BSN    Supervising physician immediately available to respond to emergencies  [facility] immediately available    Physician(s)  Dr. Broadus John    Medication changes reported      No    Fall or balance concerns reported     No    Tobacco Cessation  No Change    Warm-up and Cool-down  Performed as group-led instruction    Resistance Training Performed  Yes    VAD Patient?  No      Pain Assessment   Currently in Pain?  No/denies    Multiple Pain Sites  No       Capillary Blood Glucose: No results found for this or any previous visit (from the past 24 hour(s)).  Exercise Prescription Changes - 04/20/17 1200      Response to Exercise   Blood Pressure (Admit)  150/80    Blood Pressure (Exercise)  144/88    Blood Pressure (Exit)  154/70    Heart Rate (Admit)  83 bpm    Heart Rate (Exercise)  83 bpm    Heart Rate (Exit)  81 bpm    Oxygen Saturation (Admit)  93 %    Oxygen Saturation (Exercise)  92 %    Oxygen Saturation (Exit)  93 %    Rating of Perceived Exertion (Exercise)  9    Perceived Dyspnea (Exercise)  0    Duration  Progress to 45 minutes of aerobic exercise without signs/symptoms of physical distress    Intensity  THRR unchanged      Progression   Progression  Continue to progress workloads to maintain intensity without signs/symptoms of physical distress.      Resistance Training   Training Prescription  Yes    Weight  orange bands    Reps  10-15    Time   10 Minutes      Bike   Level  0.4    Minutes  17      NuStep   Level  4    Minutes  17    METs  2.1       Social History   Tobacco Use  Smoking Status Former Smoker  . Packs/day: 0.30  . Years: 20.00  . Pack years: 6.00  . Types: Cigarettes  . Last attempt to quit: 06/06/2002  . Years since quitting: 14.8  Smokeless Tobacco Never Used    Goals Met:  Exercise tolerated well No report of cardiac concerns or symptoms Strength training completed today  Goals Unmet:  Not Applicable  Comments: Service time is from 10:30a to 12:30p    Dr. Rush Farmer is Medical Director for Pulmonary Rehab at [facility].

## 2017-04-21 DIAGNOSIS — N2581 Secondary hyperparathyroidism of renal origin: Secondary | ICD-10-CM | POA: Diagnosis not present

## 2017-04-21 DIAGNOSIS — D631 Anemia in chronic kidney disease: Secondary | ICD-10-CM | POA: Diagnosis not present

## 2017-04-21 DIAGNOSIS — T8579XA Infection and inflammatory reaction due to other internal prosthetic devices, implants and grafts, initial encounter: Secondary | ICD-10-CM | POA: Diagnosis not present

## 2017-04-21 DIAGNOSIS — E876 Hypokalemia: Secondary | ICD-10-CM | POA: Diagnosis not present

## 2017-04-21 DIAGNOSIS — E1129 Type 2 diabetes mellitus with other diabetic kidney complication: Secondary | ICD-10-CM | POA: Diagnosis not present

## 2017-04-21 DIAGNOSIS — N186 End stage renal disease: Secondary | ICD-10-CM | POA: Diagnosis not present

## 2017-04-23 DIAGNOSIS — T8579XA Infection and inflammatory reaction due to other internal prosthetic devices, implants and grafts, initial encounter: Secondary | ICD-10-CM | POA: Diagnosis not present

## 2017-04-23 DIAGNOSIS — N2581 Secondary hyperparathyroidism of renal origin: Secondary | ICD-10-CM | POA: Diagnosis not present

## 2017-04-23 DIAGNOSIS — D631 Anemia in chronic kidney disease: Secondary | ICD-10-CM | POA: Diagnosis not present

## 2017-04-23 DIAGNOSIS — E876 Hypokalemia: Secondary | ICD-10-CM | POA: Diagnosis not present

## 2017-04-23 DIAGNOSIS — N186 End stage renal disease: Secondary | ICD-10-CM | POA: Diagnosis not present

## 2017-04-23 DIAGNOSIS — E1129 Type 2 diabetes mellitus with other diabetic kidney complication: Secondary | ICD-10-CM | POA: Diagnosis not present

## 2017-04-25 ENCOUNTER — Encounter (HOSPITAL_COMMUNITY): Payer: Medicare Other

## 2017-04-25 DIAGNOSIS — G4733 Obstructive sleep apnea (adult) (pediatric): Secondary | ICD-10-CM | POA: Diagnosis not present

## 2017-04-25 DIAGNOSIS — N2581 Secondary hyperparathyroidism of renal origin: Secondary | ICD-10-CM | POA: Diagnosis not present

## 2017-04-25 DIAGNOSIS — E876 Hypokalemia: Secondary | ICD-10-CM | POA: Diagnosis not present

## 2017-04-25 DIAGNOSIS — E1129 Type 2 diabetes mellitus with other diabetic kidney complication: Secondary | ICD-10-CM | POA: Diagnosis not present

## 2017-04-25 DIAGNOSIS — D631 Anemia in chronic kidney disease: Secondary | ICD-10-CM | POA: Diagnosis not present

## 2017-04-25 DIAGNOSIS — N186 End stage renal disease: Secondary | ICD-10-CM | POA: Diagnosis not present

## 2017-04-25 DIAGNOSIS — T8579XA Infection and inflammatory reaction due to other internal prosthetic devices, implants and grafts, initial encounter: Secondary | ICD-10-CM | POA: Diagnosis not present

## 2017-04-28 DIAGNOSIS — D631 Anemia in chronic kidney disease: Secondary | ICD-10-CM | POA: Diagnosis not present

## 2017-04-28 DIAGNOSIS — T8579XA Infection and inflammatory reaction due to other internal prosthetic devices, implants and grafts, initial encounter: Secondary | ICD-10-CM | POA: Diagnosis not present

## 2017-04-28 DIAGNOSIS — E876 Hypokalemia: Secondary | ICD-10-CM | POA: Diagnosis not present

## 2017-04-28 DIAGNOSIS — N2581 Secondary hyperparathyroidism of renal origin: Secondary | ICD-10-CM | POA: Diagnosis not present

## 2017-04-28 DIAGNOSIS — E1129 Type 2 diabetes mellitus with other diabetic kidney complication: Secondary | ICD-10-CM | POA: Diagnosis not present

## 2017-04-28 DIAGNOSIS — N186 End stage renal disease: Secondary | ICD-10-CM | POA: Diagnosis not present

## 2017-05-01 DIAGNOSIS — E876 Hypokalemia: Secondary | ICD-10-CM | POA: Diagnosis not present

## 2017-05-01 DIAGNOSIS — E1129 Type 2 diabetes mellitus with other diabetic kidney complication: Secondary | ICD-10-CM | POA: Diagnosis not present

## 2017-05-01 DIAGNOSIS — N186 End stage renal disease: Secondary | ICD-10-CM | POA: Diagnosis not present

## 2017-05-01 DIAGNOSIS — T8579XA Infection and inflammatory reaction due to other internal prosthetic devices, implants and grafts, initial encounter: Secondary | ICD-10-CM | POA: Diagnosis not present

## 2017-05-01 DIAGNOSIS — N2581 Secondary hyperparathyroidism of renal origin: Secondary | ICD-10-CM | POA: Diagnosis not present

## 2017-05-01 DIAGNOSIS — D631 Anemia in chronic kidney disease: Secondary | ICD-10-CM | POA: Diagnosis not present

## 2017-05-02 ENCOUNTER — Encounter (HOSPITAL_COMMUNITY)
Admission: RE | Admit: 2017-05-02 | Discharge: 2017-05-02 | Disposition: A | Discharge status: Home / Self Care | Payer: Medicare Other | Source: Ambulatory Visit | Attending: Pulmonary Disease | Admitting: Pulmonary Disease

## 2017-05-02 VITALS — Wt 140.2 lb

## 2017-05-02 DIAGNOSIS — Z794 Long term (current) use of insulin: Secondary | ICD-10-CM | POA: Diagnosis not present

## 2017-05-02 DIAGNOSIS — Z7982 Long term (current) use of aspirin: Secondary | ICD-10-CM | POA: Diagnosis not present

## 2017-05-02 DIAGNOSIS — Z942 Lung transplant status: Secondary | ICD-10-CM | POA: Diagnosis not present

## 2017-05-02 DIAGNOSIS — K219 Gastro-esophageal reflux disease without esophagitis: Secondary | ICD-10-CM | POA: Diagnosis not present

## 2017-05-02 DIAGNOSIS — Z79899 Other long term (current) drug therapy: Secondary | ICD-10-CM | POA: Diagnosis not present

## 2017-05-02 DIAGNOSIS — Z7952 Long term (current) use of systemic steroids: Secondary | ICD-10-CM | POA: Diagnosis not present

## 2017-05-02 NOTE — Progress Notes (Signed)
Daily Session Note  Patient Details  Name: [name] MRN: [id number] Date of Birth: 07/12/1950 Referring Provider:     Pulmonary Rehab Walk Test from 03/14/2017 in [place]  Referring Provider  Dr. Nelda Marseille      Encounter Date: 05/02/2017  Check In: Session Check In - 05/02/17 1030      Check-In   Location  [facility]    Staff Present  Rosebud Poles, RN, BSN;Molly diVincenzo, MS, ACSM RCEP, Exercise Physiologist;Kaelem Brach Ysidro Evert, RN;Portia Rollene Rotunda, RN, BSN    Supervising physician immediately available to respond to emergencies  [facility] immediately available    Physician(s)  Dr. Maylene Roes    Medication changes reported      No    Fall or balance concerns reported     No    Tobacco Cessation  No Change    Warm-up and Cool-down  Performed as group-led instruction    Resistance Training Performed  Yes    VAD Patient?  No      Pain Assessment   Currently in Pain?  No/denies    Multiple Pain Sites  No       Capillary Blood Glucose: No results found for this or any previous visit (from the past 24 hour(s)).  Exercise Prescription Changes - 05/02/17 1200      Response to Exercise   Blood Pressure (Admit)  126/60    Blood Pressure (Exercise)  114/70    Blood Pressure (Exit)  102/68    Heart Rate (Admit)  78 bpm    Heart Rate (Exercise)  82 bpm    Heart Rate (Exit)  82 bpm    Oxygen Saturation (Admit)  94 %    Oxygen Saturation (Exercise)  92 %    Oxygen Saturation (Exit)  94 %    Rating of Perceived Exertion (Exercise)  11    Perceived Dyspnea (Exercise)  1    Duration  Progress to 45 minutes of aerobic exercise without signs/symptoms of physical distress    Intensity  THRR unchanged      Progression   Progression  Continue to progress workloads to maintain intensity without signs/symptoms of physical distress.      Resistance Training   Training Prescription  Yes    Weight  orange bands    Reps  10-15    Time   10 Minutes      Bike   Level  0.4    Minutes  17      NuStep   Level  4    Minutes  17    METs  2.1      Track   Laps  10    Minutes  17       Social History   Tobacco Use  Smoking Status Former Smoker  . Packs/day: 0.30  . Years: 20.00  . Pack years: 6.00  . Types: Cigarettes  . Last attempt to quit: 06/06/2002  . Years since quitting: 14.9  Smokeless Tobacco Never Used    Goals Met:  Exercise tolerated well No report of cardiac concerns or symptoms Strength training completed today  Goals Unmet:  Not Applicable  Comments: Service time is from 1030 to 1215    Dr. Rush Farmer is Medical Director for Pulmonary Rehab at [facility].

## 2017-05-03 DIAGNOSIS — N2581 Secondary hyperparathyroidism of renal origin: Secondary | ICD-10-CM | POA: Diagnosis not present

## 2017-05-03 DIAGNOSIS — D631 Anemia in chronic kidney disease: Secondary | ICD-10-CM | POA: Diagnosis not present

## 2017-05-03 DIAGNOSIS — E1129 Type 2 diabetes mellitus with other diabetic kidney complication: Secondary | ICD-10-CM | POA: Diagnosis not present

## 2017-05-03 DIAGNOSIS — N186 End stage renal disease: Secondary | ICD-10-CM | POA: Diagnosis not present

## 2017-05-03 DIAGNOSIS — E876 Hypokalemia: Secondary | ICD-10-CM | POA: Diagnosis not present

## 2017-05-03 DIAGNOSIS — T8579XA Infection and inflammatory reaction due to other internal prosthetic devices, implants and grafts, initial encounter: Secondary | ICD-10-CM | POA: Diagnosis not present

## 2017-05-04 ENCOUNTER — Encounter (HOSPITAL_COMMUNITY)
Admission: RE | Admit: 2017-05-04 | Discharge: 2017-05-04 | Disposition: A | Discharge status: Home / Self Care | Payer: Medicare Other | Source: Ambulatory Visit | Attending: Pulmonary Disease | Admitting: Pulmonary Disease

## 2017-05-04 VITALS — Wt 137.8 lb

## 2017-05-04 DIAGNOSIS — Z79899 Other long term (current) drug therapy: Secondary | ICD-10-CM | POA: Diagnosis not present

## 2017-05-04 DIAGNOSIS — K219 Gastro-esophageal reflux disease without esophagitis: Secondary | ICD-10-CM | POA: Diagnosis not present

## 2017-05-04 DIAGNOSIS — Z942 Lung transplant status: Secondary | ICD-10-CM | POA: Diagnosis not present

## 2017-05-04 DIAGNOSIS — Z794 Long term (current) use of insulin: Secondary | ICD-10-CM | POA: Diagnosis not present

## 2017-05-04 DIAGNOSIS — Z7952 Long term (current) use of systemic steroids: Secondary | ICD-10-CM | POA: Diagnosis not present

## 2017-05-04 DIAGNOSIS — Z7982 Long term (current) use of aspirin: Secondary | ICD-10-CM | POA: Diagnosis not present

## 2017-05-04 NOTE — Progress Notes (Signed)
Daily Session Note  Patient Details  Name: [name] MRN: [id number] Date of Birth: 09-19-1950 Referring Provider:     Pulmonary Rehab Walk Test from 03/14/2017 in [place]  Referring Provider  Dr. Nelda Marseille      Encounter Date: 05/04/2017  Check In: Session Check In - 05/04/17 1108      Check-In   Location  [facility]    Staff Present  Rosebud Poles, RN, BSN;Brilynn Biasi, MS, ACSM RCEP, Exercise Physiologist;Lisa Ysidro Evert, RN;Portia Rollene Rotunda, RN, BSN    Supervising physician immediately available to respond to emergencies  [facility] immediately available    Physician(s)  Dr. Nevada Crane    Medication changes reported      No    Fall or balance concerns reported     No    Tobacco Cessation  No Change    Warm-up and Cool-down  Performed as group-led instruction    Resistance Training Performed  Yes    VAD Patient?  No      Pain Assessment   Currently in Pain?  No/denies    Multiple Pain Sites  No       Capillary Blood Glucose: No results found for this or any previous visit (from the past 24 hour(s)).  Exercise Prescription Changes - 05/04/17 1400      Response to Exercise   Blood Pressure (Admit)  106/50    Blood Pressure (Exercise)  122/60    Blood Pressure (Exit)  96/60    Heart Rate (Admit)  76 bpm    Heart Rate (Exercise)  87 bpm    Heart Rate (Exit)  84 bpm    Oxygen Saturation (Admit)  91 %    Oxygen Saturation (Exercise)  91 %    Oxygen Saturation (Exit)  92 %    Rating of Perceived Exertion (Exercise)  11    Perceived Dyspnea (Exercise)  1    Duration  Progress to 45 minutes of aerobic exercise without signs/symptoms of physical distress    Intensity  THRR unchanged      Progression   Progression  Continue to progress workloads to maintain intensity without signs/symptoms of physical distress.      Resistance Training   Training Prescription  Yes    Weight  orange bands    Reps  10-15    Time   10 Minutes      NuStep   Level  5    Minutes  17    METs  2      Track   Laps  12    Minutes  17       Social History   Tobacco Use  Smoking Status Former Smoker  . Packs/day: 0.30  . Years: 20.00  . Pack years: 6.00  . Types: Cigarettes  . Last attempt to quit: 06/06/2002  . Years since quitting: 14.9  Smokeless Tobacco Never Used    Goals Met:  Exercise tolerated well No report of cardiac concerns or symptoms Strength training completed today  Goals Unmet:  Not Applicable  Comments: Service time is from 10:30a to 12:35p    Dr. Rush Farmer is Medical Director for Pulmonary Rehab at [facility].

## 2017-05-05 DIAGNOSIS — F329 Major depressive disorder, single episode, unspecified: Secondary | ICD-10-CM | POA: Diagnosis not present

## 2017-05-05 DIAGNOSIS — E1129 Type 2 diabetes mellitus with other diabetic kidney complication: Secondary | ICD-10-CM | POA: Diagnosis not present

## 2017-05-05 DIAGNOSIS — E1122 Type 2 diabetes mellitus with diabetic chronic kidney disease: Secondary | ICD-10-CM | POA: Diagnosis not present

## 2017-05-05 DIAGNOSIS — N2581 Secondary hyperparathyroidism of renal origin: Secondary | ICD-10-CM | POA: Diagnosis not present

## 2017-05-05 DIAGNOSIS — Z992 Dependence on renal dialysis: Secondary | ICD-10-CM | POA: Diagnosis not present

## 2017-05-05 DIAGNOSIS — N186 End stage renal disease: Secondary | ICD-10-CM | POA: Diagnosis not present

## 2017-05-05 DIAGNOSIS — T8579XA Infection and inflammatory reaction due to other internal prosthetic devices, implants and grafts, initial encounter: Secondary | ICD-10-CM | POA: Diagnosis not present

## 2017-05-05 DIAGNOSIS — E876 Hypokalemia: Secondary | ICD-10-CM | POA: Diagnosis not present

## 2017-05-05 DIAGNOSIS — I5032 Chronic diastolic (congestive) heart failure: Secondary | ICD-10-CM | POA: Diagnosis not present

## 2017-05-05 DIAGNOSIS — I10 Essential (primary) hypertension: Secondary | ICD-10-CM | POA: Diagnosis not present

## 2017-05-05 DIAGNOSIS — E782 Mixed hyperlipidemia: Secondary | ICD-10-CM | POA: Diagnosis not present

## 2017-05-05 DIAGNOSIS — D631 Anemia in chronic kidney disease: Secondary | ICD-10-CM | POA: Diagnosis not present

## 2017-05-05 DIAGNOSIS — H409 Unspecified glaucoma: Secondary | ICD-10-CM | POA: Diagnosis not present

## 2017-05-05 DIAGNOSIS — Z794 Long term (current) use of insulin: Secondary | ICD-10-CM | POA: Diagnosis not present

## 2017-05-05 DIAGNOSIS — T86819 Unspecified complication of lung transplant: Secondary | ICD-10-CM | POA: Diagnosis not present

## 2017-05-08 DIAGNOSIS — N2581 Secondary hyperparathyroidism of renal origin: Secondary | ICD-10-CM | POA: Diagnosis not present

## 2017-05-08 DIAGNOSIS — E1129 Type 2 diabetes mellitus with other diabetic kidney complication: Secondary | ICD-10-CM | POA: Diagnosis not present

## 2017-05-08 DIAGNOSIS — T8579XA Infection and inflammatory reaction due to other internal prosthetic devices, implants and grafts, initial encounter: Secondary | ICD-10-CM | POA: Diagnosis not present

## 2017-05-08 DIAGNOSIS — E876 Hypokalemia: Secondary | ICD-10-CM | POA: Diagnosis not present

## 2017-05-08 DIAGNOSIS — D631 Anemia in chronic kidney disease: Secondary | ICD-10-CM | POA: Diagnosis not present

## 2017-05-08 DIAGNOSIS — N186 End stage renal disease: Secondary | ICD-10-CM | POA: Diagnosis not present

## 2017-05-09 ENCOUNTER — Encounter (HOSPITAL_COMMUNITY)
Admission: RE | Admit: 2017-05-09 | Discharge: 2017-05-09 | Disposition: A | Discharge status: Home / Self Care | Payer: Medicare Other | Source: Ambulatory Visit | Attending: Pulmonary Disease | Admitting: Pulmonary Disease

## 2017-05-09 VITALS — Wt 141.5 lb

## 2017-05-09 DIAGNOSIS — N186 End stage renal disease: Secondary | ICD-10-CM | POA: Diagnosis not present

## 2017-05-09 DIAGNOSIS — Z79899 Other long term (current) drug therapy: Secondary | ICD-10-CM | POA: Insufficient documentation

## 2017-05-09 DIAGNOSIS — Z87891 Personal history of nicotine dependence: Secondary | ICD-10-CM | POA: Diagnosis not present

## 2017-05-09 DIAGNOSIS — Z7982 Long term (current) use of aspirin: Secondary | ICD-10-CM | POA: Insufficient documentation

## 2017-05-09 DIAGNOSIS — E1122 Type 2 diabetes mellitus with diabetic chronic kidney disease: Secondary | ICD-10-CM | POA: Diagnosis not present

## 2017-05-09 DIAGNOSIS — I120 Hypertensive chronic kidney disease with stage 5 chronic kidney disease or end stage renal disease: Secondary | ICD-10-CM | POA: Insufficient documentation

## 2017-05-09 DIAGNOSIS — Z942 Lung transplant status: Secondary | ICD-10-CM | POA: Diagnosis not present

## 2017-05-09 DIAGNOSIS — G4733 Obstructive sleep apnea (adult) (pediatric): Secondary | ICD-10-CM | POA: Insufficient documentation

## 2017-05-09 DIAGNOSIS — K219 Gastro-esophageal reflux disease without esophagitis: Secondary | ICD-10-CM | POA: Diagnosis not present

## 2017-05-09 DIAGNOSIS — Z992 Dependence on renal dialysis: Secondary | ICD-10-CM | POA: Diagnosis not present

## 2017-05-09 DIAGNOSIS — Z794 Long term (current) use of insulin: Secondary | ICD-10-CM | POA: Diagnosis not present

## 2017-05-09 DIAGNOSIS — Z7952 Long term (current) use of systemic steroids: Secondary | ICD-10-CM | POA: Insufficient documentation

## 2017-05-09 NOTE — Progress Notes (Signed)
Daily Session Note  Patient Details  Name: BRAD LIEURANCE MRN: [id number] Date of Birth: [birth year] Referring Provider:     Pulmonary Rehab Walk Test from 03/14/2017 in [place]  Referring Provider  Dr. Nelda Marseille      Encounter Date: 05/09/2017  Check In: Session Check In - 05/09/17 1215      Check-In   Location  [facility]    Staff Present  Rosebud Poles, RN, BSN;Lasondra Hodgkins, MS, ACSM RCEP, Exercise Physiologist;Lisa Ysidro Evert, RN;Portia Rollene Rotunda, RN, BSN    Supervising physician immediately available to respond to emergencies  [facility] immediately available    Physician(s)  Dr. Nevada Crane    Medication changes reported      No    Fall or balance concerns reported     No    Tobacco Cessation  No Change    Warm-up and Cool-down  Performed as group-led instruction    Resistance Training Performed  Yes    VAD Patient?  No      Pain Assessment   Currently in Pain?  No/denies    Multiple Pain Sites  No       Capillary Blood Glucose: No results found for this or any previous visit (from the past 24 hour(s)). POCT Glucose - 05/09/17 1219      POCT Blood Glucose   Pre-Exercise  117 mg/dL    Post-Exercise  124 mg/dL      Exercise Prescription Changes - 05/09/17 1200      Response to Exercise   Blood Pressure (Admit)  104/60    Blood Pressure (Exercise)  124/70    Blood Pressure (Exit)  92/50    Heart Rate (Admit)  75 bpm    Heart Rate (Exercise)  85 bpm    Heart Rate (Exit)  82 bpm    Oxygen Saturation (Admit)  96 %    Oxygen Saturation (Exercise)  90 %    Oxygen Saturation (Exit)  92 %    Rating of Perceived Exertion (Exercise)  11    Perceived Dyspnea (Exercise)  1    Duration  Progress to 45 minutes of aerobic exercise without signs/symptoms of physical distress    Intensity  THRR unchanged      Progression   Progression  Continue to progress workloads to maintain intensity without signs/symptoms of physical  distress.      Resistance Training   Training Prescription  Yes    Weight  orange bands    Reps  10-15    Time  10 Minutes      Bike   Level  0.4    Minutes  17      NuStep   Level  5    Minutes  17    METs  2.1      Track   Laps  9    Minutes  17       Social History   Tobacco Use  Smoking Status Former Smoker  . Packs/day: 0.30  . Years: 20.00  . Pack years: 6.00  . Types: Cigarettes  . Last attempt to quit: 06/06/2002  . Years since quitting: 14.9  Smokeless Tobacco Never Used    Goals Met:  Exercise tolerated well No report of cardiac concerns or symptoms Strength training completed today  Goals Unmet:  Not Applicable  Comments: Service time is from 10:30a to 12:05p    Dr. Rush Farmer is Medical Director for Pulmonary Rehab at [facility].

## 2017-05-10 DIAGNOSIS — N186 End stage renal disease: Secondary | ICD-10-CM | POA: Diagnosis not present

## 2017-05-10 DIAGNOSIS — E876 Hypokalemia: Secondary | ICD-10-CM | POA: Diagnosis not present

## 2017-05-10 DIAGNOSIS — T8579XA Infection and inflammatory reaction due to other internal prosthetic devices, implants and grafts, initial encounter: Secondary | ICD-10-CM | POA: Diagnosis not present

## 2017-05-10 DIAGNOSIS — D631 Anemia in chronic kidney disease: Secondary | ICD-10-CM | POA: Diagnosis not present

## 2017-05-10 DIAGNOSIS — N2581 Secondary hyperparathyroidism of renal origin: Secondary | ICD-10-CM | POA: Diagnosis not present

## 2017-05-10 DIAGNOSIS — E1129 Type 2 diabetes mellitus with other diabetic kidney complication: Secondary | ICD-10-CM | POA: Diagnosis not present

## 2017-05-11 ENCOUNTER — Encounter (HOSPITAL_COMMUNITY)
Admission: RE | Admit: 2017-05-11 | Discharge: 2017-05-11 | Disposition: A | Discharge status: Home / Self Care | Payer: Medicare Other | Source: Ambulatory Visit | Attending: Pulmonary Disease | Admitting: Pulmonary Disease

## 2017-05-11 VITALS — Wt 140.9 lb

## 2017-05-11 DIAGNOSIS — Z794 Long term (current) use of insulin: Secondary | ICD-10-CM | POA: Diagnosis not present

## 2017-05-11 DIAGNOSIS — Z79899 Other long term (current) drug therapy: Secondary | ICD-10-CM | POA: Diagnosis not present

## 2017-05-11 DIAGNOSIS — Z942 Lung transplant status: Secondary | ICD-10-CM | POA: Diagnosis not present

## 2017-05-11 DIAGNOSIS — K219 Gastro-esophageal reflux disease without esophagitis: Secondary | ICD-10-CM | POA: Diagnosis not present

## 2017-05-11 DIAGNOSIS — Z7952 Long term (current) use of systemic steroids: Secondary | ICD-10-CM | POA: Diagnosis not present

## 2017-05-11 DIAGNOSIS — Z7982 Long term (current) use of aspirin: Secondary | ICD-10-CM | POA: Diagnosis not present

## 2017-05-11 NOTE — Progress Notes (Signed)
Daily Session Note  Patient Details  Name: KEYONDRE HEPBURN MRN: [id number] Date of Birth: February 11, 1951 Referring Provider:     Pulmonary Rehab Walk Test from 03/14/2017 in [place]  Referring Provider  Dr. Nelda Marseille      Encounter Date: 05/11/2017  Check In: Session Check In - 05/11/17 1030      Check-In   Location  [facility]    Staff Present  Rosebud Poles, RN, BSN;Shona Pardo, MS, ACSM RCEP, Exercise Physiologist;Lisa Ysidro Evert, RN;Portia Rollene Rotunda, RN, BSN    Supervising physician immediately available to respond to emergencies  [facility] immediately available    Physician(s)  Dr. Starla Link    Medication changes reported      No    Fall or balance concerns reported     No    Tobacco Cessation  No Change    Warm-up and Cool-down  Performed as group-led instruction    Resistance Training Performed  Yes    VAD Patient?  No      Pain Assessment   Currently in Pain?  No/denies    Multiple Pain Sites  No       Capillary Blood Glucose: No results found for this or any previous visit (from the past 24 hour(s)). POCT Glucose - 05/11/17 1249      POCT Blood Glucose   Pre-Exercise  134 mg/dL    Post-Exercise  222 mg/dL      Exercise Prescription Changes - 05/11/17 1200      Response to Exercise   Blood Pressure (Admit)  102/60    Blood Pressure (Exercise)  120/70    Blood Pressure (Exit)  90/50    Heart Rate (Admit)  81 bpm    Heart Rate (Exercise)  86 bpm    Heart Rate (Exit)  86 bpm    Oxygen Saturation (Admit)  94 %    Oxygen Saturation (Exercise)  91 %    Oxygen Saturation (Exit)  99 %    Rating of Perceived Exertion (Exercise)  11    Perceived Dyspnea (Exercise)  0    Duration  Progress to 45 minutes of aerobic exercise without signs/symptoms of physical distress    Intensity  THRR unchanged      Progression   Progression  Continue to progress workloads to maintain intensity without signs/symptoms of physical  distress.      Resistance Training   Training Prescription  Yes    Weight  orange bands    Reps  10-15    Time  10 Minutes      NuStep   Level  5    Minutes  17    METs  1.7      Track   Laps  11    Minutes  17       Social History   Tobacco Use  Smoking Status Former Smoker  . Packs/day: 0.30  . Years: 20.00  . Pack years: 6.00  . Types: Cigarettes  . Last attempt to quit: 06/06/2002  . Years since quitting: 14.9  Smokeless Tobacco Never Used    Goals Met:  Exercise tolerated well No report of cardiac concerns or symptoms Strength training completed today  Goals Unmet:  Not Applicable  Comments: Service time is from 10:30a to 12:30p    Dr. Rush Farmer is Medical Director for Pulmonary Rehab at [facility].

## 2017-05-11 NOTE — Progress Notes (Signed)
Pulmonary Individual Treatment Plan  Patient Details  Name: [name] MRN: [id number] Date of Birth: May 09, 1951 Referring Provider:     Pulmonary Rehab Walk Test from 03/14/2017 in [place]  Referring Provider  Dr. Nelda Marseille      Initial Encounter Date:    Pulmonary Rehab Walk Test from 03/14/2017 in [place]  Date  03/16/17  Referring Provider  Dr. Nelda Marseille      Visit Diagnosis: Status post lung transplantation [facility])  Patient's Home Medications on Admission:   Current Outpatient Medications:  .  ACCU-CHEK FASTCLIX LANCETS MISC, As directed up to 4 times daily, Disp: , Rfl: 3 .  acetaminophen (TYLENOL) 500 MG tablet, Take 500-1,000 mg by mouth every 6 (six) hours as needed for headache., Disp: , Rfl:  .  aspirin 81 MG tablet, Take 81 mg by mouth daily. , Disp: , Rfl:  .  azaTHIOprine (IMURAN) 50 MG tablet, Take 1/2 tablet (25 mg) every Monday, Wednesday and Friday after HD., Disp: , Rfl:  .  calcium acetate (PHOSLO) 667 MG capsule, Take 1,334 mg by mouth 3 (three) times daily before meals. , Disp: , Rfl:  .  cycloSPORINE modified (NEORAL) 25 MG capsule, Take 75 mg by mouth 2 (two) times daily. , Disp: , Rfl:  .  cycloSPORINE modified (NEORAL) 25 MG capsule, Take by mouth., Disp: , Rfl:  .  doxercalciferol (HECTOROL) 4 MCG/2ML injection, Inject into the vein., Disp: , Rfl:  .  fluticasone (FLONASE) 50 MCG/ACT nasal spray, Place 2 sprays into the nose daily. (Patient taking differently: Place 2 sprays into the nose daily as needed for allergies. ), Disp: 30 g, Rfl: 2 .  hydrOXYzine (ATARAX/VISTARIL) 25 MG tablet, Take by mouth., Disp: , Rfl:  .  insulin regular (NOVOLIN R) 100 units/mL injection, Inject 5 Units into the skin 3 (three) times daily before meals. Additional units (per sliding scale): BGL 201-250 = 1 unit; 251-300 = 2 units; 301-350 = 3 units; 351-400 = units; >401 = 5 units + CALL LUNG COORDINATOR @  [facility], Disp: , Rfl:  .  insulin regular (NOVOLIN R,HUMULIN R) 100 units/mL injection, 201 - 250 mg/dL 1 units, 251 - 300 mg/dL 2 units, 301 - 350 mg/dL 3 units, >350 mg/dL Notify Provider, Disp: , Rfl:  .  latanoprost (XALATAN) 0.005 % ophthalmic solution, Place 1 drop into both eyes at bedtime., Disp: , Rfl:  .  metoCLOPramide (REGLAN) 5 MG tablet, Take by mouth., Disp: , Rfl:  .  midodrine (PROAMATINE) 10 MG tablet, Take by mouth., Disp: , Rfl:  .  Multiple Vitamin (MULTIVITAMIN WITH MINERALS) TABS tablet, Take 1 tablet by mouth daily., Disp: , Rfl:  .  omeprazole (PRILOSEC) 20 MG capsule, Take by mouth., Disp: , Rfl:  .  ondansetron (ZOFRAN) 4 MG tablet, Take 4 mg by mouth every 8 (eight) hours as needed for nausea or vomiting. , Disp: , Rfl:  .  polyvinyl alcohol-povidone (REFRESH) 1.4-0.6 % ophthalmic solution, Place 1-2 drops into both eyes daily as needed (for dryness)., Disp: , Rfl:  .  pravastatin (PRAVACHOL) 20 MG tablet, Take 20 mg by mouth every evening. , Disp: , Rfl:  .  predniSONE (DELTASONE) 5 MG tablet, Take by mouth., Disp: , Rfl:  .  sertraline (ZOLOFT) 50 MG tablet, Take 50 mg by mouth daily., Disp: , Rfl:  .  sulfamethoxazole-trimethoprim (BACTRIM,SEPTRA) 400-80 MG tablet, Take 1 tablet by mouth 3 (three) times a week. Monday,  Wednesday, Friday, Disp: , Rfl:  .  valGANciclovir (VALCYTE) 450 MG tablet, Take 1 tablet by mouth See admin instructions. Every Monday and Friday AFTER DIALYSIS, Disp: , Rfl:   Past Medical History: Past Medical History:  Diagnosis Date  . Aortic valve disorders   . Benign neoplasm of colon   . Degeneration of intervertebral disc, site unspecified   . Diabetes mellitus without complication ([place])   . 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 Esophageal reflux   . ESRD (end stage renal disease) on dialysis ([place])   . Essential hypertension   . Obstructive sleep apnea (adult) (pediatric)   . Osteoarthrosis, unspecified  whether generalized or localized, unspecified site   . Other and unspecified hyperlipidemia   . Pneumonia    interstitial pneumonia  . Pulmonary fibrosis ([place])   . Renal disorder   . Respiratory failure with hypoxia ([place]) 12/2015  . Shortness of breath dyspnea   . Transplanted, lung ([place])   . Unspecified essential hypertension     Tobacco Use: Social History   Tobacco Use  Smoking Status Former Smoker  . Packs/day: 0.30  . Years: 20.00  . Pack years: 6.00  . Types: Cigarettes  . Last attempt to quit: 06/06/2002  . Years since quitting: 14.9  Smokeless Tobacco Never Used    Labs: Recent Chemical engineer    Labs for ITP Cardiac and Pulmonary Rehab Latest Ref Rng & Units 04/19/2015 12/30/2015 01/01/2016 01/01/2016 01/01/2016   Hemoglobin A1c <5.7 % - - - - -   PHART 7.350 - 7.450 7.299(L) 7.368 7.458(H) 7.393 7.381   PCO2ART 35.0 - 45.0 mmHg 40.5 39.6 37.2 45.2(H) 42.4   HCO3 20.0 - 24.0 mEq/L 19.3(L) 22.7 26.0(H) 27.1(H) 24.7(H)   TCO2 0 - 100 mmol/L 20.5 24 27._0 ACIDBASEDEF 0.0 - 2.0 mmol/L 6.0(H) 2.0 - - -   O2SAT % - 91.0 91.1 100.0 94.0      Capillary Blood Glucose: Lab Results  Component Value Date   GLUCAP 166 (H) 04/11/2017   GLUCAP 144 (H) 04/06/2017   GLUCAP 122 (H) 01/01/2016   GLUCAP 82 01/01/2016   GLUCAP 83 01/01/2016   POCT Glucose    Row Name 03/21/17 1226 03/23/17 1245 03/28/17 1213 04/04/17 1156 04/06/17 1245     POCT Blood Glucose   Pre-Exercise  143 mg/dL  250 mg/dL  143 mg/dL  108 mg/dL  127 mg/dL   Post-Exercise  75 mg/dL  179 mg/dL  113 mg/dL  86 mg/dL  144 mg/dL   Post-Exercise #2  68 mg/dL  -  -  -  -   Post-Exercise #3  88 mg/dL  -  -  -  -   Row Name 04/11/17 1222 04/13/17 1317 04/18/17 1254 04/20/17 1233 05/02/17 1240     POCT Blood Glucose   Pre-Exercise  151 mg/dL  154 mg/dL  139 mg/dL  179 mg/dL  119 mg/dL snack ate before exercise   Post-Exercise  166 mg/dL  103 mg/dL  119 mg/dL  123 mg/dL  126 mg/dL   Row Name 05/04/17  1402 05/09/17 1219           POCT Blood Glucose   Pre-Exercise  117 mg/dL  117 mg/dL      Post-Exercise  172 mg/dL  124 mg/dL         Pulmonary Assessment Scores: Pulmonary Assessment Scores    Row Name 03/14/17 1354 03/16/17 1308  ADL UCSD   ADL Phase  Entry  Entry    SOB Score total  30  -      CAT Score   CAT Score  8 Entry  -      mMRC Score   mMRC Score  -  1       Pulmonary Function Assessment: Pulmonary Function Assessment - 03/13/17 0941      Breath   Bilateral Breath Sounds  Other    Other  right lung clear but deminished throughout, left upper clear, fine crackles mid to lower base    Shortness of Breath  No       Exercise Target Goals:    Exercise Program Goal: Individual exercise prescription set with THRR, safety & activity barriers. Participant demonstrates ability to understand and report RPE using BORG scale, to self-measure pulse accurately, and to acknowledge the importance of the exercise prescription.  Exercise Prescription Goal: Starting with aerobic activity 30 plus minutes a day, 3 days per week for initial exercise prescription. Provide home exercise prescription and guidelines that participant acknowledges understanding prior to discharge.  Activity Barriers & Risk Stratification: Activity Barriers & Cardiac Risk Stratification - 03/13/17 0921      Activity Barriers & Cardiac Risk Stratification   Activity Barriers  Deconditioning;Balance Concerns       6 Minute Walk: 6 Minute Walk    Row Name 03/16/17 0716         6 Minute Walk   Phase  Initial     Distance  1000 feet     Walk Time  6 minutes     # of Rest Breaks  0     MPH  1.89     METS  2.45     RPE  12     Perceived Dyspnea   0     Symptoms  Yes (comment)     Comments  5/10 calf pain     Resting HR  77 bpm     Resting BP  105/62     Resting Oxygen Saturation   93 %     Exercise Oxygen Saturation  during 6 min walk  88 %     Max Ex. HR  91 bpm     Max Ex.  BP  182/79     2 Minute Post BP  173/82 159/80       Interval HR   1 Minute HR  77     2 Minute HR  82     3 Minute HR  84     4 Minute HR  89     5 Minute HR  90     6 Minute HR  91     Interval Heart Rate?  Yes       Interval Oxygen   Interval Oxygen?  Yes     Baseline Oxygen Saturation %  93 %     1 Minute Oxygen Saturation %  93 %     1 Minute Liters of Oxygen  0 L     2 Minute Oxygen Saturation %  89 %     2 Minute Liters of Oxygen  0 L     3 Minute Oxygen Saturation %  89 %     3 Minute Liters of Oxygen  0 L     4 Minute Oxygen Saturation %  90 %     4 Minute Liters of Oxygen  0 L  5 Minute Oxygen Saturation %  88 %     5 Minute Liters of Oxygen  0 L     6 Minute Oxygen Saturation %  90 %     6 Minute Liters of Oxygen  0 L        Oxygen Initial Assessment: Oxygen Initial Assessment - 03/16/17 0724      Initial 6 min Walk   Oxygen Used  None      Program Oxygen Prescription   Program Oxygen Prescription  None       Oxygen Re-Evaluation: Oxygen Re-Evaluation    Row Name 03/26/17 2135 04/17/17 1608 05/08/17 1725         Program Oxygen Prescription   Program Oxygen Prescription  None  None  None       Home Oxygen   Home Oxygen Device  None  None  None     Sleep Oxygen Prescription  CPAP  CPAP  CPAP     Home Exercise Oxygen Prescription  None  None  None     Home at Rest Exercise Oxygen Prescription  None  None  None     Compliance with Home Oxygen Use  No  No  No       Goals/Expected Outcomes   Comments  MD recently ordered an overnight oximetry to determine if patient still need CPAP since lung transplant and significant weight loss  patient awaiting CPAP results  patient continues to require CPAP at home and is attempting to be compliant with nightly wear        Oxygen Discharge (Final Oxygen Re-Evaluation): Oxygen Re-Evaluation - 05/08/17 1725      Program Oxygen Prescription   Program Oxygen Prescription  None      Home Oxygen   Home  Oxygen Device  None    Sleep Oxygen Prescription  CPAP    Home Exercise Oxygen Prescription  None    Home at Rest Exercise Oxygen Prescription  None    Compliance with Home Oxygen Use  No      Goals/Expected Outcomes   Comments  patient continues to require CPAP at home and is attempting to be compliant with nightly wear       Initial Exercise Prescription: Initial Exercise Prescription - 03/16/17 0700      Date of Initial Exercise RX and Referring Provider   Date  03/16/17    Referring Provider  Dr. Nelda Marseille      Bike   Level  0.4    Minutes  17      NuStep   Level  2    Minutes  17    METs  1.4      Track   Laps  8    Minutes  17      Prescription Details   Frequency (times per week)  2    Duration  Progress to 45 minutes of aerobic exercise without signs/symptoms of physical distress      Intensity   THRR 40-80% of Max Heartrate  61-122    Ratings of Perceived Exertion  11-13    Perceived Dyspnea  0-4      Progression   Progression  Continue progressive overload as per policy without signs/symptoms or physical distress.      Resistance Training   Training Prescription  Yes    Weight  orange bands    Reps  10-15       Perform Capillary Blood Glucose checks as needed.  Exercise Prescription  Changes: Exercise Prescription Changes    Row Name 03/21/17 1228 03/23/17 1200 03/28/17 1200 03/30/17 1200 04/04/17 1154     Response to Exercise   Blood Pressure (Admit)  112/72  110/70  140/60  104/54  122/70   Blood Pressure (Exercise)  132/88  100/70  130/70  110/60  138/54   Blood Pressure (Exit)  100/66  130/86  112/70  100/64  98/56   Heart Rate (Admit)  74 bpm  77 bpm  78 bpm  77 bpm  75 bpm   Heart Rate (Exercise)  80 bpm  94 bpm  92 bpm  83 bpm  82 bpm   Heart Rate (Exit)  76 bpm  83 bpm  80 bpm  80 bpm  79 bpm   Oxygen Saturation (Admit)  95 %  96 %  95 %  92 %  95 %   Oxygen Saturation (Exercise)  88 %  89 %  88 %  92 %  90 %   Oxygen Saturation (Exit)   76 %  91 %  95 %  96 %  94 %   Rating of Perceived Exertion (Exercise)  _0 Perceived Dyspnea (Exercise)  0  0  _1 Duration  Progress to 45 minutes of aerobic exercise without signs/symptoms of physical distress  Progress to 45 minutes of aerobic exercise without signs/symptoms of physical distress  Progress to 45 minutes of aerobic exercise without signs/symptoms of physical distress  Progress to 45 minutes of aerobic exercise without signs/symptoms of physical distress  Progress to 45 minutes of aerobic exercise without signs/symptoms of physical distress   Intensity  Other (comment) 40-80% HRR  Other (comment) 40-80% HRR  Other (comment) 40-80% HRR  Other (comment) 40-80% HRR  Other (comment) 40-80% HRR     Progression   Progression  Continue to progress workloads to maintain intensity without signs/symptoms of physical distress.  Continue to progress workloads to maintain intensity without signs/symptoms of physical distress.  Continue to progress workloads to maintain intensity without signs/symptoms of physical distress.  Continue to progress workloads to maintain intensity without signs/symptoms of physical distress.  Continue to progress workloads to maintain intensity without signs/symptoms of physical distress.     Resistance Training   Training Prescription  Yes  Yes  Yes  Yes  Yes   Weight  orange bands  orange bands  orange bands  orange bands  orange bands   Reps  10-15  10-15  10-15  10-15  10-15     Oxygen   Oxygen  - Room air  - Room air  - Room air  - Room air  - Room air     Bike   Level  0.4  -  0.4  0.4  0.4   Minutes  17  -  _2 NuStep   Level  _3 -  2   Minutes  _4 -  17   METs  1.4  1.9  1.8  -  1.7     Track   Laps  -  7.5  8  -  -   Minutes  -  17  17  -  -   Row Name 04/06/17 1200 04/11/17 1200 04/13/17 1300 04/18/17 1200 04/20/17 1200     Response to Exercise  Blood Pressure (Admit)  108/54  110/60  100/48   104/60  150/80   Blood Pressure (Exercise)  154/74  150/66  102/60  126/70  144/88   Blood Pressure (Exit)  104/70  124/78  100/60  100/72  154/70   Heart Rate (Admit)  75 bpm  80 bpm  81 bpm  74 bpm  83 bpm   Heart Rate (Exercise)  80 bpm  83 bpm  86 bpm  95 bpm  83 bpm   Heart Rate (Exit)  78 bpm  80 bpm  88 bpm  82 bpm  81 bpm   Oxygen Saturation (Admit)  91 %  90 %  95 %  92 %  93 %   Oxygen Saturation (Exercise)  94 %  92 %  92 %  89 %  92 %   Oxygen Saturation (Exit)  97 %  98 %  94 %  90 %  93 %   Rating of Perceived Exertion (Exercise)  _0 Perceived Dyspnea (Exercise)  0  0  0  0  0   Duration  Progress to 45 minutes of aerobic exercise without signs/symptoms of physical distress  Progress to 45 minutes of aerobic exercise without signs/symptoms of physical distress  Progress to 45 minutes of aerobic exercise without signs/symptoms of physical distress  Progress to 45 minutes of aerobic exercise without signs/symptoms of physical distress  Progress to 45 minutes of aerobic exercise without signs/symptoms of physical distress   Intensity  Other (comment) 40-80% HRR  THRR unchanged  THRR unchanged  THRR unchanged  THRR unchanged     Progression   Progression  Continue to progress workloads to maintain intensity without signs/symptoms of physical distress.  Continue to progress workloads to maintain intensity without signs/symptoms of physical distress.  Continue to progress workloads to maintain intensity without signs/symptoms of physical distress.  Continue to progress workloads to maintain intensity without signs/symptoms of physical distress.  Continue to progress workloads to maintain intensity without signs/symptoms of physical distress.     Resistance Training   Training Prescription  Yes  Yes  Yes  Yes  Yes   Weight  orange bands  orange bands  orange bands  orange bands  orange bands   Reps  10-15  10-15  10-15  10-15  10-15   Time  -  10 Minutes  10 Minutes  10  Minutes  10 Minutes     Oxygen   Oxygen  - Room air  -  -  -  -     Bike   Level  -  0.4  0.4  0.4  0.4   Minutes  -  _1 NuStep   Level  3  3  -  4  4   Minutes  17  17  -  17  17   METs  1.6  1.7  -  2.6  2.1     Track   Laps  7  7.5  9.5  9  -   Minutes  _2 -     Home Exercise Plan   Plans to continue exercise at  -  Home (comment)  -  -  -   Frequency  -  Add 1 additional day to program exercise sessions.  -  -  -   Row  Name 05/02/17 1200 05/04/17 1400 05/09/17 1200         Response to Exercise   Blood Pressure (Admit)  126/60  106/50  104/60     Blood Pressure (Exercise)  114/70  122/60  124/70     Blood Pressure (Exit)  102/68  96/60  92/50     Heart Rate (Admit)  78 bpm  76 bpm  75 bpm     Heart Rate (Exercise)  82 bpm  87 bpm  85 bpm     Heart Rate (Exit)  82 bpm  84 bpm  82 bpm     Oxygen Saturation (Admit)  94 %  91 %  96 %     Oxygen Saturation (Exercise)  92 %  91 %  90 %     Oxygen Saturation (Exit)  94 %  92 %  92 %     Rating of Perceived Exertion (Exercise)  _0 Perceived Dyspnea (Exercise)  _1 Duration  Progress to 45 minutes of aerobic exercise without signs/symptoms of physical distress  Progress to 45 minutes of aerobic exercise without signs/symptoms of physical distress  Progress to 45 minutes of aerobic exercise without signs/symptoms of physical distress     Intensity  THRR unchanged  THRR unchanged  THRR unchanged       Progression   Progression  Continue to progress workloads to maintain intensity without signs/symptoms of physical distress.  Continue to progress workloads to maintain intensity without signs/symptoms of physical distress.  Continue to progress workloads to maintain intensity without signs/symptoms of physical distress.       Resistance Training   Training Prescription  Yes  Yes  Yes     Weight  orange bands  orange bands  orange bands     Reps  10-15  10-15  10-15     Time  10  Minutes  10 Minutes  10 Minutes       Bike   Level  0.4  -  0.4     Minutes  17  -  17       NuStep   Level  _2 Minutes  _3 METs  2.1  2  2.1       Track   Laps  _4 Minutes  _5 Exercise Comments: Exercise Comments    Row Name 04/11/17 1259           Exercise Comments  home exercise completed          Exercise Goals and Review: Exercise Goals    Row Name 03/13/17 7062             Exercise Goals   Increase Physical Activity  Yes       Intervention  Provide advice, education, support and counseling about physical activity/exercise needs.;Develop an individualized exercise prescription for aerobic and resistive training based on initial evaluation findings, risk stratification, comorbidities and participant's personal goals.       Expected Outcomes  Achievement of increased cardiorespiratory fitness and enhanced flexibility, muscular endurance and strength shown through measurements of functional capacity and personal statement of participant.       Increase Strength and Stamina  Yes  Intervention  Provide advice, education, support and counseling about physical activity/exercise needs.;Develop an individualized exercise prescription for aerobic and resistive training based on initial evaluation findings, risk stratification, comorbidities and participant's personal goals.       Expected Outcomes  Achievement of increased cardiorespiratory fitness and enhanced flexibility, muscular endurance and strength shown through measurements of functional capacity and personal statement of participant.       Able to understand and use rate of perceived exertion (RPE) scale  Yes       Intervention  Provide education and explanation on how to use RPE scale       Expected Outcomes  Short Term: Able to use RPE daily in rehab to express subjective intensity level;Long Term:  Able to use RPE to guide intensity level when exercising  independently       Able to understand and use Dyspnea scale  Yes       Intervention  Provide education and explanation on how to use Dyspnea scale       Expected Outcomes  Short Term: Able to use Dyspnea scale daily in rehab to express subjective sense of shortness of breath during exertion;Long Term: Able to use Dyspnea scale to guide intensity level when exercising independently       Knowledge and understanding of Target Heart Rate Range (THRR)  Yes       Intervention  Provide education and explanation of THRR including how the numbers were predicted and where they are located for reference       Expected Outcomes  Short Term: Able to state/look up THRR;Long Term: Able to use THRR to govern intensity when exercising independently;Short Term: Able to use daily as guideline for intensity in rehab       Understanding of Exercise Prescription  Yes       Intervention  Provide education, explanation, and written materials on patient's individual exercise prescription       Expected Outcomes  Short Term: Able to explain program exercise prescription;Long Term: Able to explain home exercise prescription to exercise independently          Exercise Goals Re-Evaluation : Exercise Goals Re-Evaluation    Row Name 03/27/17 1012 04/20/17 0952 05/08/17 1639         Exercise Goal Re-Evaluation   Exercise Goals Review  Increase Physical Activity;Increase Strength and Stamina;Able to understand and use Dyspnea scale;Able to understand and use rate of perceived exertion (RPE) scale;Knowledge and understanding of Target Heart Rate Range (THRR);Understanding of Exercise Prescription  Increase Strength and Stamina;Increase Physical Activity;Able to understand and use rate of perceived exertion (RPE) scale;Understanding of Exercise Prescription;Knowledge and understanding of Target Heart Rate Range (THRR);Able to understand and use Dyspnea scale  Increase Strength and Stamina;Increase Physical Activity;Able to  understand and use rate of perceived exertion (RPE) scale;Understanding of Exercise Prescription;Knowledge and understanding of Target Heart Rate Range (THRR);Able to understand and use Dyspnea scale     Comments  Patient has only attended 2 exercise sessions. Will cont. to monitor and progress as able.   Patient is progressing well. Will start to increase intensity now that the patient has been here for a few weeks. Will cont. to monitor and progress as able.   Patient is progressing well. Is up to 12 laps (200 ft each) in 15 minutes. Is open to workload changes. Will cont. to monitor and progress as able.      Expected Outcomes  Through exercise at rehab and at home, patient will increase strength  and stamina and decrease shortness of breath  Through exercise at rehab and at home, patient will increase strength and stamina and also be able to perform ADL's easier.  Through exercise at rehab and at home, patient will increase strength and stamina and also be able to perform ADL's easier.        Discharge Exercise Prescription (Final Exercise Prescription Changes): Exercise Prescription Changes - 05/09/17 1200      Response to Exercise   Blood Pressure (Admit)  104/60    Blood Pressure (Exercise)  124/70    Blood Pressure (Exit)  92/50    Heart Rate (Admit)  75 bpm    Heart Rate (Exercise)  85 bpm    Heart Rate (Exit)  82 bpm    Oxygen Saturation (Admit)  96 %    Oxygen Saturation (Exercise)  90 %    Oxygen Saturation (Exit)  92 %    Rating of Perceived Exertion (Exercise)  11    Perceived Dyspnea (Exercise)  1    Duration  Progress to 45 minutes of aerobic exercise without signs/symptoms of physical distress    Intensity  THRR unchanged      Progression   Progression  Continue to progress workloads to maintain intensity without signs/symptoms of physical distress.      Resistance Training   Training Prescription  Yes    Weight  orange bands    Reps  10-15    Time  10 Minutes       Bike   Level  0.4    Minutes  17      NuStep   Level  5    Minutes  17    METs  2.1      Track   Laps  9    Minutes  17       Nutrition:  Target Goals: Understanding of nutrition guidelines, daily intake of sodium <1526m, cholesterol <2040m calories 30% from fat and 7% or less from saturated fats, daily to have 5 or more servings of fruits and vegetables.  Biometrics: Pre Biometrics - 03/13/17 0922      Pre Biometrics   Grip Strength  20 kg        Nutrition Therapy Plan and Nutrition Goals: Nutrition Therapy & Goals - 03/30/17 1347      Nutrition Therapy   Diet  Renal, Diabetic      Personal Nutrition Goals   Nutrition Goal  Pt to maintain his current wt while in Pulmonary Rehab.      Intervention Plan   Intervention  Prescribe, educate and counsel regarding individualized specific dietary modifications aiming towards targeted core components such as weight, hypertension, lipid management, diabetes, heart failure and other comorbidities.    Expected Outcomes  Short Term Goal: Understand basic principles of dietary content, such as calories, fat, sodium, cholesterol and nutrients.;Long Term Goal: Adherence to prescribed nutrition plan.       Nutrition Discharge: Rate Your Plate Scores: Nutrition Assessments - 03/16/17 1453      Rate Your Plate Scores   Pre Score  55       Nutrition Goals Re-Evaluation:   Nutrition Goals Discharge (Final Nutrition Goals Re-Evaluation):   Psychosocial: Target Goals: Acknowledge presence or absence of significant depression and/or stress, maximize coping skills, provide positive support system. Participant is able to verbalize types and ability to use techniques and skills needed for reducing stress and depression.  Initial Review & Psychosocial Screening: Initial Psych Review & Screening -  03/13/17 0942      Initial Review   Current issues with  None Identified      Family Dynamics   Good Support System?  Yes       Barriers   Psychosocial barriers to participate in program  There are no identifiable barriers or psychosocial needs.      Screening Interventions   Interventions  Encouraged to exercise       Quality of Life Scores:   PHQ-9: Recent Review Flowsheet Data    Depression screen [facility] 2/9 03/13/2017 08/21/2015 07/22/2014 05/09/2014 12/03/2012   Decreased Interest 0 0 0 0 0   Down, Depressed, Hopeless 0 0 0 0 0   PHQ - 2 Score 0 0 0 0 0     Interpretation of Total Score  Total Score Depression Severity:  1-4 = Minimal depression, 5-9 = Mild depression, 10-14 = Moderate depression, 15-19 = Moderately severe depression, 20-27 = Severe depression   Psychosocial Evaluation and Intervention: Psychosocial Evaluation - 03/13/17 0943      Psychosocial Evaluation & Interventions   Interventions  Encouraged to exercise with the program and follow exercise prescription    Expected Outcomes  patient will remain free from psysocial barriers to participate in pulmonary rehab    Continue Psychosocial Services   No Follow up required       Psychosocial Re-Evaluation: Psychosocial Re-Evaluation    Avinger Name 03/26/17 2137 04/17/17 1614 05/08/17 1728         Psychosocial Re-Evaluation   Current issues with  None Identified  None Identified  None Identified     Expected Outcomes  patient will remain free from psychosocial barriers to participation  patient will remain free from psychosocial barriers to participation  patient will remain free from psychosocial barriers to participation     Interventions  Encouraged to attend Pulmonary Rehabilitation for the exercise  Encouraged to attend Pulmonary Rehabilitation for the exercise  Encouraged to attend Pulmonary Rehabilitation for the exercise     Continue Psychosocial Services   No Follow up required  No Follow up required  No Follow up required        Psychosocial Discharge (Final Psychosocial Re-Evaluation): Psychosocial Re-Evaluation - 05/08/17 1728       Psychosocial Re-Evaluation   Current issues with  None Identified    Expected Outcomes  patient will remain free from psychosocial barriers to participation    Interventions  Encouraged to attend Pulmonary Rehabilitation for the exercise    Continue Psychosocial Services   No Follow up required       Education: Education Goals: Education classes will be provided on a weekly basis, covering required topics. Participant will state understanding/return demonstration of topics presented.  Learning Barriers/Preferences: Learning Barriers/Preferences - 03/13/17 0940      Learning Barriers/Preferences   Learning Barriers  None    Learning Preferences  Individual Instruction;Skilled Demonstration;Written Material;Verbal Instruction       Education Topics: Risk Factor Reduction:  -Group instruction that is supported by a PowerPoint presentation. Instructor discusses the definition of a risk factor, different risk factors for pulmonary disease, and how the heart and lungs work together.     Nutrition for Pulmonary Patient:  -Group instruction provided by PowerPoint slides, verbal discussion, and written materials to support subject matter. The instructor gives an explanation and review of healthy diet recommendations, which includes a discussion on weight management, recommendations for fruit and vegetable consumption, as well as protein, fluid, caffeine, fiber, sodium, sugar, and alcohol. Tips  for eating when patients are short of breath are discussed.   Pursed Lip Breathing:  -Group instruction that is supported by demonstration and informational handouts. Instructor discusses the benefits of pursed lip and diaphragmatic breathing and detailed demonstration on how to preform both.     Oxygen Safety:  -Group instruction provided by PowerPoint, verbal discussion, and written material to support subject matter. There is an overview of "What is Oxygen" and "Why do we need it".  Instructor  also reviews how to create a safe environment for oxygen use, the importance of using oxygen as prescribed, and the risks of noncompliance. There is a brief discussion on traveling with oxygen and resources the patient may utilize.   Oxygen Equipment:  -Group instruction provided by [facility] Staff utilizing handouts, written materials, and equipment demonstrations.   PULMONARY REHAB OTHER RESPIRATORY from 05/04/2017 in [place]  Date  05/04/17  Educator  [facility]  Instruction Review Code  2- meets goals/outcomes      Signs and Symptoms:  -Group instruction provided by written material and verbal discussion to support subject matter. Warning signs and symptoms of infection, stroke, and heart attack are reviewed and when to call the physician/911 reinforced. Tips for preventing the spread of infection discussed.   Advanced Directives:  -Group instruction provided by verbal instruction and written material to support subject matter. Instructor reviews Advanced Directive laws and proper instruction for filling out document.   Pulmonary Video:  -Group video education that reviews the importance of medication and oxygen compliance, exercise, good nutrition, pulmonary hygiene, and pursed lip and diaphragmatic breathing for the pulmonary patient.   Exercise for the Pulmonary Patient:  -Group instruction that is supported by a PowerPoint presentation. Instructor discusses benefits of exercise, core components of exercise, frequency, duration, and intensity of an exercise routine, importance of utilizing pulse oximetry during exercise, safety while exercising, and options of places to exercise outside of rehab.     PULMONARY REHAB OTHER RESPIRATORY from 05/04/2017 in [place]  Date  04/20/17  Educator  Cloyde Reams  Instruction Review Code  2- meets goals/outcomes      Pulmonary Medications:  -Verbally interactive group  education provided by instructor with focus on inhaled medications and proper administration.   PULMONARY REHAB OTHER RESPIRATORY from 04/06/2017 in [place]  Date  04/06/17  Educator  Pharm D  Instruction Review Code  2- meets goals/outcomes      Anatomy and Physiology of the Respiratory System and Intimacy:  -Group instruction provided by PowerPoint, verbal discussion, and written material to support subject matter. Instructor reviews respiratory cycle and anatomical components of the respiratory system and their functions. Instructor also reviews differences in obstructive and restrictive respiratory diseases with examples of each. Intimacy, Sex, and Sexuality differences are reviewed with a discussion on how relationships can change when diagnosed with pulmonary disease. Common sexual concerns are reviewed.   MD DAY -A group question and answer session with a medical doctor that allows participants to ask questions that relate to their pulmonary disease state.   OTHER EDUCATION -Group or individual verbal, written, or video instructions that support the educational goals of the pulmonary rehab program.   Knowledge Questionnaire Score: Knowledge Questionnaire Score - 03/14/17 1353      Knowledge Questionnaire Score   Pre Score  9/13       Core Components/Risk Factors/Patient Goals at Admission: Personal Goals and Risk Factors at Admission - 03/13/17  0942      Core Components/Risk Factors/Patient Goals on Admission   Improve shortness of breath with ADL's  Yes    Intervention  Provide education, individualized exercise plan and daily activity instruction to help decrease symptoms of SOB with activities of daily living.    Expected Outcomes  Short Term: Achieves a reduction of symptoms when performing activities of daily living.       Core Components/Risk Factors/Patient Goals Review:  Goals and Risk Factor Review    Row Name 03/26/17 2136  04/17/17 1608 05/08/17 1726         Core Components/Risk Factors/Patient Goals Review   Personal Goals Review  Improve shortness of breath with ADL's  Improve shortness of breath with ADL's  Improve shortness of breath with ADL's     Review  patient has only attended 2 exercise sessions since admission. Too early to evaluate progress towards rehab goals. should see progression over the next 30 days  patient is working hard in pulmonary rehab. he is tolerating workload increases and verbalizes and improvement in his shortness of breath. his bloodsugars are an issue most days and we are trying to find a good combination of the right amount of medications and healthy breakfast/snack to keep his bloodsugars up during exercise. He is working with our diabetes educator on this. He continues to go to dialysis M-W-F which limits his ability to exercise at home on those days.   Patient continues to work hard in pulmonary rehab. He attends 90% of the scheduled sessions. He states he is beginning to see improvement in his physical stamina and strength and his exertional shortness of breath with ADLs and he feels this is helping him tolerate dialysis better.     Expected Outcomes  see admission goals  see admission goals  see admission goals        Core Components/Risk Factors/Patient Goals at Discharge (Final Review):  Goals and Risk Factor Review - 05/08/17 1726      Core Components/Risk Factors/Patient Goals Review   Personal Goals Review  Improve shortness of breath with ADL's    Review  Patient continues to work hard in pulmonary rehab. He attends 90% of the scheduled sessions. He states he is beginning to see improvement in his physical stamina and strength and his exertional shortness of breath with ADLs and he feels this is helping him tolerate dialysis better.    Expected Outcomes  see admission goals       ITP Comments:   Comments: patient has attended 13 sessions since admission

## 2017-05-12 DIAGNOSIS — N186 End stage renal disease: Secondary | ICD-10-CM | POA: Diagnosis not present

## 2017-05-12 DIAGNOSIS — E876 Hypokalemia: Secondary | ICD-10-CM | POA: Diagnosis not present

## 2017-05-12 DIAGNOSIS — N2581 Secondary hyperparathyroidism of renal origin: Secondary | ICD-10-CM | POA: Diagnosis not present

## 2017-05-12 DIAGNOSIS — T8579XA Infection and inflammatory reaction due to other internal prosthetic devices, implants and grafts, initial encounter: Secondary | ICD-10-CM | POA: Diagnosis not present

## 2017-05-12 DIAGNOSIS — D631 Anemia in chronic kidney disease: Secondary | ICD-10-CM | POA: Diagnosis not present

## 2017-05-12 DIAGNOSIS — E1129 Type 2 diabetes mellitus with other diabetic kidney complication: Secondary | ICD-10-CM | POA: Diagnosis not present

## 2017-05-15 DIAGNOSIS — E876 Hypokalemia: Secondary | ICD-10-CM | POA: Diagnosis not present

## 2017-05-15 DIAGNOSIS — T8579XA Infection and inflammatory reaction due to other internal prosthetic devices, implants and grafts, initial encounter: Secondary | ICD-10-CM | POA: Diagnosis not present

## 2017-05-15 DIAGNOSIS — D631 Anemia in chronic kidney disease: Secondary | ICD-10-CM | POA: Diagnosis not present

## 2017-05-15 DIAGNOSIS — N186 End stage renal disease: Secondary | ICD-10-CM | POA: Diagnosis not present

## 2017-05-15 DIAGNOSIS — N2581 Secondary hyperparathyroidism of renal origin: Secondary | ICD-10-CM | POA: Diagnosis not present

## 2017-05-15 DIAGNOSIS — E1129 Type 2 diabetes mellitus with other diabetic kidney complication: Secondary | ICD-10-CM | POA: Diagnosis not present

## 2017-05-16 ENCOUNTER — Encounter (HOSPITAL_COMMUNITY): Payer: Medicare Other

## 2017-05-16 DIAGNOSIS — M6281 Muscle weakness (generalized): Secondary | ICD-10-CM | POA: Diagnosis not present

## 2017-05-16 DIAGNOSIS — Z8619 Personal history of other infectious and parasitic diseases: Secondary | ICD-10-CM | POA: Diagnosis not present

## 2017-05-16 DIAGNOSIS — R5381 Other malaise: Secondary | ICD-10-CM | POA: Diagnosis not present

## 2017-05-16 DIAGNOSIS — E1122 Type 2 diabetes mellitus with diabetic chronic kidney disease: Secondary | ICD-10-CM | POA: Diagnosis not present

## 2017-05-16 DIAGNOSIS — Z7952 Long term (current) use of systemic steroids: Secondary | ICD-10-CM | POA: Diagnosis not present

## 2017-05-16 DIAGNOSIS — T86810 Lung transplant rejection: Secondary | ICD-10-CM | POA: Diagnosis not present

## 2017-05-16 DIAGNOSIS — I70213 Atherosclerosis of native arteries of extremities with intermittent claudication, bilateral legs: Secondary | ICD-10-CM | POA: Diagnosis not present

## 2017-05-16 DIAGNOSIS — I120 Hypertensive chronic kidney disease with stage 5 chronic kidney disease or end stage renal disease: Secondary | ICD-10-CM | POA: Diagnosis not present

## 2017-05-16 DIAGNOSIS — Z794 Long term (current) use of insulin: Secondary | ICD-10-CM | POA: Diagnosis not present

## 2017-05-16 DIAGNOSIS — G4700 Insomnia, unspecified: Secondary | ICD-10-CM | POA: Diagnosis not present

## 2017-05-16 DIAGNOSIS — J8410 Pulmonary fibrosis, unspecified: Secondary | ICD-10-CM | POA: Diagnosis not present

## 2017-05-16 DIAGNOSIS — R6881 Early satiety: Secondary | ICD-10-CM | POA: Diagnosis not present

## 2017-05-16 DIAGNOSIS — Z942 Lung transplant status: Secondary | ICD-10-CM | POA: Diagnosis not present

## 2017-05-16 DIAGNOSIS — Z992 Dependence on renal dialysis: Secondary | ICD-10-CM | POA: Diagnosis not present

## 2017-05-16 DIAGNOSIS — J9811 Atelectasis: Secondary | ICD-10-CM | POA: Diagnosis not present

## 2017-05-16 DIAGNOSIS — E0822 Diabetes mellitus due to underlying condition with diabetic chronic kidney disease: Secondary | ICD-10-CM | POA: Diagnosis not present

## 2017-05-16 DIAGNOSIS — D899 Disorder involving the immune mechanism, unspecified: Secondary | ICD-10-CM | POA: Diagnosis not present

## 2017-05-16 DIAGNOSIS — R760 Raised antibody titer: Secondary | ICD-10-CM | POA: Diagnosis not present

## 2017-05-16 DIAGNOSIS — R0602 Shortness of breath: Secondary | ICD-10-CM | POA: Diagnosis not present

## 2017-05-16 DIAGNOSIS — R251 Tremor, unspecified: Secondary | ICD-10-CM | POA: Diagnosis not present

## 2017-05-16 DIAGNOSIS — Z8711 Personal history of peptic ulcer disease: Secondary | ICD-10-CM | POA: Diagnosis not present

## 2017-05-16 DIAGNOSIS — N185 Chronic kidney disease, stage 5: Secondary | ICD-10-CM | POA: Diagnosis not present

## 2017-05-16 DIAGNOSIS — Z79899 Other long term (current) drug therapy: Secondary | ICD-10-CM | POA: Diagnosis not present

## 2017-05-16 DIAGNOSIS — K228 Other specified diseases of esophagus: Secondary | ICD-10-CM | POA: Diagnosis not present

## 2017-05-16 DIAGNOSIS — Z87891 Personal history of nicotine dependence: Secondary | ICD-10-CM | POA: Diagnosis not present

## 2017-05-17 DIAGNOSIS — E1129 Type 2 diabetes mellitus with other diabetic kidney complication: Secondary | ICD-10-CM | POA: Diagnosis not present

## 2017-05-17 DIAGNOSIS — T8579XA Infection and inflammatory reaction due to other internal prosthetic devices, implants and grafts, initial encounter: Secondary | ICD-10-CM | POA: Diagnosis not present

## 2017-05-17 DIAGNOSIS — N186 End stage renal disease: Secondary | ICD-10-CM | POA: Diagnosis not present

## 2017-05-17 DIAGNOSIS — E876 Hypokalemia: Secondary | ICD-10-CM | POA: Diagnosis not present

## 2017-05-17 DIAGNOSIS — D631 Anemia in chronic kidney disease: Secondary | ICD-10-CM | POA: Diagnosis not present

## 2017-05-17 DIAGNOSIS — N2581 Secondary hyperparathyroidism of renal origin: Secondary | ICD-10-CM | POA: Diagnosis not present

## 2017-05-18 ENCOUNTER — Encounter (HOSPITAL_COMMUNITY)
Admission: RE | Admit: 2017-05-18 | Discharge: 2017-05-18 | Disposition: A | Discharge status: Home / Self Care | Payer: Medicare Other | Source: Ambulatory Visit | Attending: Pulmonary Disease | Admitting: Pulmonary Disease

## 2017-05-18 VITALS — Wt 139.6 lb

## 2017-05-18 DIAGNOSIS — Z79899 Other long term (current) drug therapy: Secondary | ICD-10-CM | POA: Diagnosis not present

## 2017-05-18 DIAGNOSIS — K219 Gastro-esophageal reflux disease without esophagitis: Secondary | ICD-10-CM | POA: Diagnosis not present

## 2017-05-18 DIAGNOSIS — Z942 Lung transplant status: Secondary | ICD-10-CM

## 2017-05-18 DIAGNOSIS — Z7952 Long term (current) use of systemic steroids: Secondary | ICD-10-CM | POA: Diagnosis not present

## 2017-05-18 DIAGNOSIS — Z7982 Long term (current) use of aspirin: Secondary | ICD-10-CM | POA: Diagnosis not present

## 2017-05-18 DIAGNOSIS — Z794 Long term (current) use of insulin: Secondary | ICD-10-CM | POA: Diagnosis not present

## 2017-05-18 NOTE — Progress Notes (Signed)
Daily Session Note  Patient Details  Name: [name] MRN: [id number] Date of Birth: 1950/10/13 Referring Provider:     Pulmonary Rehab Walk Test from 03/14/2017 in [place]  Referring Provider  Dr. Nelda Marseille      Encounter Date: 05/18/2017  Check In: Session Check In - 05/18/17 1226      Check-In   Location  [facility]    Staff Present  Trish Fountain, RN, Maxcine Ham, RN, BSN;Arita Severtson, MS, ACSM RCEP, Exercise Physiologist    Supervising physician immediately available to respond to emergencies  [facility] immediately available    Physician(s)  Dr. Starla Link    Medication changes reported      No       Capillary Blood Glucose: No results found for this or any previous visit (from the past 24 hour(s)). POCT Glucose - 05/18/17 1227      POCT Blood Glucose   Pre-Exercise  124 mg/dL    Post-Exercise  122 mg/dL      Exercise Prescription Changes - 05/18/17 1200      Response to Exercise   Blood Pressure (Admit)  104/60    Blood Pressure (Exercise)  126/60    Blood Pressure (Exit)  100/54    Heart Rate (Admit)  75 bpm    Heart Rate (Exercise)  87 bpm    Heart Rate (Exit)  82 bpm    Oxygen Saturation (Admit)  99 %    Oxygen Saturation (Exercise)  90 %    Oxygen Saturation (Exit)  94 %    Rating of Perceived Exertion (Exercise)  11    Perceived Dyspnea (Exercise)  0    Duration  Progress to 45 minutes of aerobic exercise without signs/symptoms of physical distress    Intensity  THRR unchanged      Progression   Progression  Continue to progress workloads to maintain intensity without signs/symptoms of physical distress.      Resistance Training   Training Prescription  Yes    Weight  orange bands    Reps  10-15    Time  10 Minutes      Bike   Level  0.4    Minutes  17      NuStep   Level  5    Minutes  17    METs  2.5      Track   Laps  12    Minutes  17       Social History    Tobacco Use  Smoking Status Former Smoker  . Packs/day: 0.30  . Years: 20.00  . Pack years: 6.00  . Types: Cigarettes  . Last attempt to quit: 06/06/2002  . Years since quitting: 14.9  Smokeless Tobacco Never Used    Goals Met:  Exercise tolerated well No report of cardiac concerns or symptoms Strength training completed today  Goals Unmet:  Not Applicable  Comments: Service time is from 10:30a to 12:05p    Dr. Rush Farmer is Medical Director for Pulmonary Rehab at [facility].

## 2017-05-19 DIAGNOSIS — N186 End stage renal disease: Secondary | ICD-10-CM | POA: Diagnosis not present

## 2017-05-19 DIAGNOSIS — N2581 Secondary hyperparathyroidism of renal origin: Secondary | ICD-10-CM | POA: Diagnosis not present

## 2017-05-19 DIAGNOSIS — D631 Anemia in chronic kidney disease: Secondary | ICD-10-CM | POA: Diagnosis not present

## 2017-05-19 DIAGNOSIS — E876 Hypokalemia: Secondary | ICD-10-CM | POA: Diagnosis not present

## 2017-05-19 DIAGNOSIS — T8579XA Infection and inflammatory reaction due to other internal prosthetic devices, implants and grafts, initial encounter: Secondary | ICD-10-CM | POA: Diagnosis not present

## 2017-05-19 DIAGNOSIS — E1129 Type 2 diabetes mellitus with other diabetic kidney complication: Secondary | ICD-10-CM | POA: Diagnosis not present

## 2017-05-22 DIAGNOSIS — E1129 Type 2 diabetes mellitus with other diabetic kidney complication: Secondary | ICD-10-CM | POA: Diagnosis not present

## 2017-05-22 DIAGNOSIS — N2581 Secondary hyperparathyroidism of renal origin: Secondary | ICD-10-CM | POA: Diagnosis not present

## 2017-05-22 DIAGNOSIS — D631 Anemia in chronic kidney disease: Secondary | ICD-10-CM | POA: Diagnosis not present

## 2017-05-22 DIAGNOSIS — T8579XA Infection and inflammatory reaction due to other internal prosthetic devices, implants and grafts, initial encounter: Secondary | ICD-10-CM | POA: Diagnosis not present

## 2017-05-22 DIAGNOSIS — E876 Hypokalemia: Secondary | ICD-10-CM | POA: Diagnosis not present

## 2017-05-22 DIAGNOSIS — N186 End stage renal disease: Secondary | ICD-10-CM | POA: Diagnosis not present

## 2017-05-23 ENCOUNTER — Encounter (HOSPITAL_COMMUNITY)
Admission: RE | Admit: 2017-05-23 | Discharge: 2017-05-23 | Disposition: A | Discharge status: Home / Self Care | Payer: Medicare Other | Source: Ambulatory Visit | Attending: Pulmonary Disease | Admitting: Pulmonary Disease

## 2017-05-23 VITALS — Wt 140.2 lb

## 2017-05-23 DIAGNOSIS — Z794 Long term (current) use of insulin: Secondary | ICD-10-CM | POA: Diagnosis not present

## 2017-05-23 DIAGNOSIS — Z7952 Long term (current) use of systemic steroids: Secondary | ICD-10-CM | POA: Diagnosis not present

## 2017-05-23 DIAGNOSIS — Z7982 Long term (current) use of aspirin: Secondary | ICD-10-CM | POA: Diagnosis not present

## 2017-05-23 DIAGNOSIS — Z79899 Other long term (current) drug therapy: Secondary | ICD-10-CM | POA: Diagnosis not present

## 2017-05-23 DIAGNOSIS — K219 Gastro-esophageal reflux disease without esophagitis: Secondary | ICD-10-CM | POA: Diagnosis not present

## 2017-05-23 DIAGNOSIS — Z942 Lung transplant status: Secondary | ICD-10-CM

## 2017-05-23 NOTE — Progress Notes (Signed)
Daily Session Note  Patient Details  Name: [name] MRN: [id number] Date of Birth: [birth year] Referring Provider:     Pulmonary Rehab Walk Test from 03/14/2017 in [place]  Referring Provider  Dr. Nelda Marseille      Encounter Date: 05/23/2017  Check In: Session Check In - 05/23/17 1218      Check-In   Location  [facility]    Staff Present  Trish Fountain, RN, Maxcine Ham, RN, BSN;Lisa Hughes, RN;Elisa Sorlie, MS, ACSM RCEP, Exercise Physiologist    Supervising physician immediately available to respond to emergencies  [facility] immediately available    Physician(s)  Dr. Lucianne Lei    Medication changes reported      No    Fall or balance concerns reported     No    Tobacco Cessation  No Change    Warm-up and Cool-down  Performed as group-led instruction    Resistance Training Performed  Yes    VAD Patient?  No      Pain Assessment   Currently in Pain?  No/denies    Multiple Pain Sites  No       Capillary Blood Glucose: No results found for this or any previous visit (from the past 24 hour(s)).  Exercise Prescription Changes - 05/23/17 1200      Response to Exercise   Blood Pressure (Admit)  118/64    Blood Pressure (Exercise)  138/68    Blood Pressure (Exit)  100/64    Heart Rate (Admit)  75 bpm    Heart Rate (Exercise)  79 bpm    Heart Rate (Exit)  84 bpm    Oxygen Saturation (Admit)  94 %    Oxygen Saturation (Exercise)  93 %    Oxygen Saturation (Exit)  98 %    Rating of Perceived Exertion (Exercise)  11    Perceived Dyspnea (Exercise)  0    Duration  Progress to 45 minutes of aerobic exercise without signs/symptoms of physical distress    Intensity  THRR unchanged      Progression   Progression  Continue to progress workloads to maintain intensity without signs/symptoms of physical distress.      Resistance Training   Training Prescription  Yes    Weight  orange bands    Reps  10-15    Time   10 Minutes      Bike   Level  0.4    Minutes  17      NuStep   Level  5    Minutes  17    METs  2.9      Track   Laps  10    Minutes  17       Social History   Tobacco Use  Smoking Status Former Smoker  . Packs/day: 0.30  . Years: 20.00  . Pack years: 6.00  . Types: Cigarettes  . Last attempt to quit: 06/06/2002  . Years since quitting: 14.9  Smokeless Tobacco Never Used    Goals Met:  Exercise tolerated well No report of cardiac concerns or symptoms Strength training completed today  Goals Unmet:  Not Applicable  Comments: Service time is from 10:30a to 12:15p    Dr. Rush Farmer is Medical Director for Pulmonary Rehab at [facility].

## 2017-05-24 DIAGNOSIS — E876 Hypokalemia: Secondary | ICD-10-CM | POA: Diagnosis not present

## 2017-05-24 DIAGNOSIS — D631 Anemia in chronic kidney disease: Secondary | ICD-10-CM | POA: Diagnosis not present

## 2017-05-24 DIAGNOSIS — N186 End stage renal disease: Secondary | ICD-10-CM | POA: Diagnosis not present

## 2017-05-24 DIAGNOSIS — E1129 Type 2 diabetes mellitus with other diabetic kidney complication: Secondary | ICD-10-CM | POA: Diagnosis not present

## 2017-05-24 DIAGNOSIS — T8579XA Infection and inflammatory reaction due to other internal prosthetic devices, implants and grafts, initial encounter: Secondary | ICD-10-CM | POA: Diagnosis not present

## 2017-05-24 DIAGNOSIS — N2581 Secondary hyperparathyroidism of renal origin: Secondary | ICD-10-CM | POA: Diagnosis not present

## 2017-05-25 ENCOUNTER — Encounter (HOSPITAL_COMMUNITY)
Admission: RE | Admit: 2017-05-25 | Discharge: 2017-05-25 | Disposition: A | Discharge status: Home / Self Care | Payer: Medicare Other | Source: Ambulatory Visit

## 2017-05-25 DIAGNOSIS — R142 Eructation: Secondary | ICD-10-CM | POA: Diagnosis not present

## 2017-05-25 DIAGNOSIS — R066 Hiccough: Secondary | ICD-10-CM | POA: Diagnosis not present

## 2017-05-26 DIAGNOSIS — N186 End stage renal disease: Secondary | ICD-10-CM | POA: Diagnosis not present

## 2017-05-26 DIAGNOSIS — E1129 Type 2 diabetes mellitus with other diabetic kidney complication: Secondary | ICD-10-CM | POA: Diagnosis not present

## 2017-05-26 DIAGNOSIS — D631 Anemia in chronic kidney disease: Secondary | ICD-10-CM | POA: Diagnosis not present

## 2017-05-26 DIAGNOSIS — N2581 Secondary hyperparathyroidism of renal origin: Secondary | ICD-10-CM | POA: Diagnosis not present

## 2017-05-26 DIAGNOSIS — T8579XA Infection and inflammatory reaction due to other internal prosthetic devices, implants and grafts, initial encounter: Secondary | ICD-10-CM | POA: Diagnosis not present

## 2017-05-26 DIAGNOSIS — E876 Hypokalemia: Secondary | ICD-10-CM | POA: Diagnosis not present

## 2017-05-28 DIAGNOSIS — E876 Hypokalemia: Secondary | ICD-10-CM | POA: Diagnosis not present

## 2017-05-28 DIAGNOSIS — D631 Anemia in chronic kidney disease: Secondary | ICD-10-CM | POA: Diagnosis not present

## 2017-05-28 DIAGNOSIS — N2581 Secondary hyperparathyroidism of renal origin: Secondary | ICD-10-CM | POA: Diagnosis not present

## 2017-05-28 DIAGNOSIS — N186 End stage renal disease: Secondary | ICD-10-CM | POA: Diagnosis not present

## 2017-05-28 DIAGNOSIS — E1129 Type 2 diabetes mellitus with other diabetic kidney complication: Secondary | ICD-10-CM | POA: Diagnosis not present

## 2017-05-28 DIAGNOSIS — T8579XA Infection and inflammatory reaction due to other internal prosthetic devices, implants and grafts, initial encounter: Secondary | ICD-10-CM | POA: Diagnosis not present

## 2017-05-31 DIAGNOSIS — E1129 Type 2 diabetes mellitus with other diabetic kidney complication: Secondary | ICD-10-CM | POA: Diagnosis not present

## 2017-05-31 DIAGNOSIS — T8579XA Infection and inflammatory reaction due to other internal prosthetic devices, implants and grafts, initial encounter: Secondary | ICD-10-CM | POA: Diagnosis not present

## 2017-05-31 DIAGNOSIS — N186 End stage renal disease: Secondary | ICD-10-CM | POA: Diagnosis not present

## 2017-05-31 DIAGNOSIS — D631 Anemia in chronic kidney disease: Secondary | ICD-10-CM | POA: Diagnosis not present

## 2017-05-31 DIAGNOSIS — N2581 Secondary hyperparathyroidism of renal origin: Secondary | ICD-10-CM | POA: Diagnosis not present

## 2017-05-31 DIAGNOSIS — E876 Hypokalemia: Secondary | ICD-10-CM | POA: Diagnosis not present

## 2017-06-01 ENCOUNTER — Encounter (HOSPITAL_COMMUNITY)
Admission: RE | Admit: 2017-06-01 | Discharge: 2017-06-01 | Disposition: A | Discharge status: Home / Self Care | Payer: Medicare Other | Source: Ambulatory Visit | Attending: Pulmonary Disease | Admitting: Pulmonary Disease

## 2017-06-01 VITALS — Wt 142.2 lb

## 2017-06-01 DIAGNOSIS — Z942 Lung transplant status: Secondary | ICD-10-CM | POA: Diagnosis not present

## 2017-06-01 DIAGNOSIS — Z794 Long term (current) use of insulin: Secondary | ICD-10-CM | POA: Diagnosis not present

## 2017-06-01 DIAGNOSIS — Z7982 Long term (current) use of aspirin: Secondary | ICD-10-CM | POA: Diagnosis not present

## 2017-06-01 DIAGNOSIS — Z79899 Other long term (current) drug therapy: Secondary | ICD-10-CM | POA: Diagnosis not present

## 2017-06-01 DIAGNOSIS — Z7952 Long term (current) use of systemic steroids: Secondary | ICD-10-CM | POA: Diagnosis not present

## 2017-06-01 DIAGNOSIS — K219 Gastro-esophageal reflux disease without esophagitis: Secondary | ICD-10-CM | POA: Diagnosis not present

## 2017-06-01 NOTE — Progress Notes (Signed)
Daily Session Note  Patient Details  Name: [name] MRN: [id number] Date of Birth: 1951/05/22 Referring Provider:     Pulmonary Rehab Walk Test from 03/14/2017 in [place]  Referring Provider  Dr. Nelda Marseille      Encounter Date: 06/01/2017  Check In: Session Check In - 06/01/17 1030      Check-In   Location  [facility]    Staff Present  Rosebud Poles, RN, BSN;Ramon Dredge, RN, MHA;Portia Rollene Rotunda, RN, Roque Cash, RN    Supervising physician immediately available to respond to emergencies  [facility] immediately available    Physician(s)  Dr. Rockne Menghini    Medication changes reported      No    Fall or balance concerns reported     No    Tobacco Cessation  No Change    Warm-up and Cool-down  Performed as group-led instruction    Resistance Training Performed  Yes    VAD Patient?  No      Pain Assessment   Currently in Pain?  No/denies    Multiple Pain Sites  No       Capillary Blood Glucose: No results found for this or any previous visit (from the past 24 hour(s)). POCT Glucose - 06/01/17 1221      POCT Blood Glucose   Pre-Exercise  128 mg/dL    Post-Exercise  111 mg/dL      Exercise Prescription Changes - 06/01/17 1200      Response to Exercise   Blood Pressure (Admit)  126/64    Blood Pressure (Exercise)  136/70    Blood Pressure (Exit)  110/68    Heart Rate (Admit)  75 bpm    Heart Rate (Exercise)  76 bpm    Heart Rate (Exit)  80 bpm    Oxygen Saturation (Admit)  97 %    Oxygen Saturation (Exercise)  90 %    Oxygen Saturation (Exit)  98 %    Rating of Perceived Exertion (Exercise)  13    Perceived Dyspnea (Exercise)  0    Duration  Progress to 45 minutes of aerobic exercise without signs/symptoms of physical distress    Intensity  THRR unchanged      Progression   Progression  Continue to progress workloads to maintain intensity without signs/symptoms of physical distress.      Resistance Training   Training Prescription  Yes    Weight  orange bands    Reps  10-15    Time  10 Minutes      Bike   Level  0.6    Minutes  17      NuStep   Level  5    Minutes  17    METs  2.2      Track   Laps  10    Minutes  17       Social History   Tobacco Use  Smoking Status Former Smoker  . Packs/day: 0.30  . Years: 20.00  . Pack years: 6.00  . Types: Cigarettes  . Last attempt to quit: 06/06/2002  . Years since quitting: 14.9  Smokeless Tobacco Never Used    Goals Met:  Exercise tolerated well Strength training completed today  Goals Unmet:  Not Applicable  Comments: Service time is from 1030 to 1215    Dr. Rush Farmer is Medical Director for Pulmonary Rehab at [facility].

## 2017-06-01 NOTE — Progress Notes (Signed)
Pulmonary Individual Treatment Plan  Patient Details  Name: [name] MRN: [id number] Date of Birth: May 09, 1951 Referring Provider:     Pulmonary Rehab Walk Test from 03/14/2017 in [place]  Referring Provider  Dr. Nelda Marseille      Initial Encounter Date:    Pulmonary Rehab Walk Test from 03/14/2017 in [place]  Date  03/16/17  Referring Provider  Dr. Nelda Marseille      Visit Diagnosis: Status post lung transplantation [facility])  Patient's Home Medications on Admission:   Current Outpatient Medications:  .  ACCU-CHEK FASTCLIX LANCETS MISC, As directed up to 4 times daily, Disp: , Rfl: 3 .  acetaminophen (TYLENOL) 500 MG tablet, Take 500-1,000 mg by mouth every 6 (six) hours as needed for headache., Disp: , Rfl:  .  aspirin 81 MG tablet, Take 81 mg by mouth daily. , Disp: , Rfl:  .  azaTHIOprine (IMURAN) 50 MG tablet, Take 1/2 tablet (25 mg) every Monday, Wednesday and Friday after HD., Disp: , Rfl:  .  calcium acetate (PHOSLO) 667 MG capsule, Take 1,334 mg by mouth 3 (three) times daily before meals. , Disp: , Rfl:  .  cycloSPORINE modified (NEORAL) 25 MG capsule, Take 75 mg by mouth 2 (two) times daily. , Disp: , Rfl:  .  cycloSPORINE modified (NEORAL) 25 MG capsule, Take by mouth., Disp: , Rfl:  .  doxercalciferol (HECTOROL) 4 MCG/2ML injection, Inject into the vein., Disp: , Rfl:  .  fluticasone (FLONASE) 50 MCG/ACT nasal spray, Place 2 sprays into the nose daily. (Patient taking differently: Place 2 sprays into the nose daily as needed for allergies. ), Disp: 30 g, Rfl: 2 .  hydrOXYzine (ATARAX/VISTARIL) 25 MG tablet, Take by mouth., Disp: , Rfl:  .  insulin regular (NOVOLIN R) 100 units/mL injection, Inject 5 Units into the skin 3 (three) times daily before meals. Additional units (per sliding scale): BGL 201-250 = 1 unit; 251-300 = 2 units; 301-350 = 3 units; 351-400 = units; >401 = 5 units + CALL LUNG COORDINATOR @  [facility], Disp: , Rfl:  .  insulin regular (NOVOLIN R,HUMULIN R) 100 units/mL injection, 201 - 250 mg/dL 1 units, 251 - 300 mg/dL 2 units, 301 - 350 mg/dL 3 units, >350 mg/dL Notify Provider, Disp: , Rfl:  .  latanoprost (XALATAN) 0.005 % ophthalmic solution, Place 1 drop into both eyes at bedtime., Disp: , Rfl:  .  metoCLOPramide (REGLAN) 5 MG tablet, Take by mouth., Disp: , Rfl:  .  midodrine (PROAMATINE) 10 MG tablet, Take by mouth., Disp: , Rfl:  .  Multiple Vitamin (MULTIVITAMIN WITH MINERALS) TABS tablet, Take 1 tablet by mouth daily., Disp: , Rfl:  .  omeprazole (PRILOSEC) 20 MG capsule, Take by mouth., Disp: , Rfl:  .  ondansetron (ZOFRAN) 4 MG tablet, Take 4 mg by mouth every 8 (eight) hours as needed for nausea or vomiting. , Disp: , Rfl:  .  polyvinyl alcohol-povidone (REFRESH) 1.4-0.6 % ophthalmic solution, Place 1-2 drops into both eyes daily as needed (for dryness)., Disp: , Rfl:  .  pravastatin (PRAVACHOL) 20 MG tablet, Take 20 mg by mouth every evening. , Disp: , Rfl:  .  predniSONE (DELTASONE) 5 MG tablet, Take by mouth., Disp: , Rfl:  .  sertraline (ZOLOFT) 50 MG tablet, Take 50 mg by mouth daily., Disp: , Rfl:  .  sulfamethoxazole-trimethoprim (BACTRIM,SEPTRA) 400-80 MG tablet, Take 1 tablet by mouth 3 (three) times a week. Monday,  Wednesday, Friday, Disp: , Rfl:  .  valGANciclovir (VALCYTE) 450 MG tablet, Take 1 tablet by mouth See admin instructions. Every Monday and Friday AFTER DIALYSIS, Disp: , Rfl:   Past Medical History: Past Medical History:  Diagnosis Date  . Aortic valve disorders   . Benign neoplasm of colon   . Degeneration of intervertebral disc, site unspecified   . Diabetes mellitus without complication ([place])   . 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 Esophageal reflux   . ESRD (end stage renal disease) on dialysis ([place])   . Essential hypertension   . Obstructive sleep apnea (adult) (pediatric)   . Osteoarthrosis, unspecified  whether generalized or localized, unspecified site   . Other and unspecified hyperlipidemia   . Pneumonia    interstitial pneumonia  . Pulmonary fibrosis ([place])   . Renal disorder   . Respiratory failure with hypoxia ([place]) 12/2015  . Shortness of breath dyspnea   . Transplanted, lung ([place])   . Unspecified essential hypertension     Tobacco Use: Social History   Tobacco Use  Smoking Status Former Smoker  . Packs/day: 0.30  . Years: 20.00  . Pack years: 6.00  . Types: Cigarettes  . Last attempt to quit: 06/06/2002  . Years since quitting: 14.9  Smokeless Tobacco Never Used    Labs: Recent Chemical engineer    Labs for ITP Cardiac and Pulmonary Rehab Latest Ref Rng & Units 04/19/2015 12/30/2015 01/01/2016 01/01/2016 01/01/2016   Hemoglobin A1c <5.7 % - - - - -   PHART 7.350 - 7.450 7.299(L) 7.368 7.458(H) 7.393 7.381   PCO2ART 35.0 - 45.0 mmHg 40.5 39.6 37.2 45.2(H) 42.4   HCO3 20.0 - 24.0 mEq/L 19.3(L) 22.7 26.0(H) 27.1(H) 24.7(H)   TCO2 0 - 100 mmol/L 20.5 24 27.'1 28 26   '$ ACIDBASEDEF 0.0 - 2.0 mmol/L 6.0(H) 2.0 - - -   O2SAT % - 91.0 91.1 100.0 94.0      Capillary Blood Glucose: Lab Results  Component Value Date   GLUCAP 166 (H) 04/11/2017   GLUCAP 144 (H) 04/06/2017   GLUCAP 122 (H) 01/01/2016   GLUCAP 82 01/01/2016   GLUCAP 83 01/01/2016   POCT Glucose    Row Name 03/21/17 1226 03/23/17 1245 03/28/17 1213 04/04/17 1156 04/06/17 1245     POCT Blood Glucose   Pre-Exercise  143 mg/dL  250 mg/dL  143 mg/dL  108 mg/dL  127 mg/dL   Post-Exercise  75 mg/dL  179 mg/dL  113 mg/dL  86 mg/dL  144 mg/dL   Post-Exercise #2  68 mg/dL  -  -  -  -   Post-Exercise #3  88 mg/dL  -  -  -  -   Row Name 04/11/17 1222 04/13/17 1317 04/18/17 1254 04/20/17 1233 05/02/17 1240     POCT Blood Glucose   Pre-Exercise  151 mg/dL  154 mg/dL  139 mg/dL  179 mg/dL  119 mg/dL snack ate before exercise   Post-Exercise  166 mg/dL  103 mg/dL  119 mg/dL  123 mg/dL  126 mg/dL   Row Name 05/04/17  1402 05/09/17 1219 05/11/17 1249 05/18/17 1227       POCT Blood Glucose   Pre-Exercise  117 mg/dL  117 mg/dL  134 mg/dL  124 mg/dL    Post-Exercise  172 mg/dL  124 mg/dL  222 mg/dL  122 mg/dL       Pulmonary Assessment Scores: Pulmonary Assessment Scores    Row Name 03/16/17  0724         ADL UCSD   ADL Phase  Entry       mMRC Score   mMRC Score  1        Pulmonary Function Assessment: Pulmonary Function Assessment - 03/13/17 0941      Breath   Bilateral Breath Sounds  Other    Other  right lung clear but deminished throughout, left upper clear, fine crackles mid to lower base    Shortness of Breath  No       Exercise Target Goals:    Exercise Program Goal: Individual exercise prescription set with THRR, safety & activity barriers. Participant demonstrates ability to understand and report RPE using BORG scale, to self-measure pulse accurately, and to acknowledge the importance of the exercise prescription.  Exercise Prescription Goal: Starting with aerobic activity 30 plus minutes a day, 3 days per week for initial exercise prescription. Provide home exercise prescription and guidelines that participant acknowledges understanding prior to discharge.  Activity Barriers & Risk Stratification: Activity Barriers & Cardiac Risk Stratification - 03/13/17 0921      Activity Barriers & Cardiac Risk Stratification   Activity Barriers  Deconditioning;Balance Concerns       6 Minute Walk: 6 Minute Walk    Row Name 03/16/17 0716         6 Minute Walk   Phase  Initial     Distance  1000 feet     Walk Time  6 minutes     # of Rest Breaks  0     MPH  1.89     METS  2.45     RPE  12     Perceived Dyspnea   0     Symptoms  Yes (comment)     Comments  5/10 calf pain     Resting HR  77 bpm     Resting BP  105/62     Resting Oxygen Saturation   93 %     Exercise Oxygen Saturation  during 6 min walk  88 %     Max Ex. HR  91 bpm     Max Ex. BP  182/79     2 Minute  Post BP  173/82 159/80       Interval HR   1 Minute HR  77     2 Minute HR  82     3 Minute HR  84     4 Minute HR  89     5 Minute HR  90     6 Minute HR  91     Interval Heart Rate?  Yes       Interval Oxygen   Interval Oxygen?  Yes     Baseline Oxygen Saturation %  93 %     1 Minute Oxygen Saturation %  93 %     1 Minute Liters of Oxygen  0 L     2 Minute Oxygen Saturation %  89 %     2 Minute Liters of Oxygen  0 L     3 Minute Oxygen Saturation %  89 %     3 Minute Liters of Oxygen  0 L     4 Minute Oxygen Saturation %  90 %     4 Minute Liters of Oxygen  0 L     5 Minute Oxygen Saturation %  88 %     5 Minute Liters of Oxygen  0  L     6 Minute Oxygen Saturation %  90 %     6 Minute Liters of Oxygen  0 L        Oxygen Initial Assessment: Oxygen Initial Assessment - 03/16/17 0724      Initial 6 min Walk   Oxygen Used  None      Program Oxygen Prescription   Program Oxygen Prescription  None       Oxygen Re-Evaluation: Oxygen Re-Evaluation    Row Name 03/26/17 2135 04/17/17 1608 05/08/17 1725 06/01/17 0829       Program Oxygen Prescription   Program Oxygen Prescription  None  None  None  None      Home Oxygen   Home Oxygen Device  None  None  None  None    Sleep Oxygen Prescription  CPAP  CPAP  CPAP  CPAP    Home Exercise Oxygen Prescription  None  None  None  None    Home at Rest Exercise Oxygen Prescription  None  None  None  None    Compliance with Home Oxygen Use  No  No  No  No      Goals/Expected Outcomes   Comments  MD recently ordered an overnight oximetry to determine if patient still need CPAP since lung transplant and significant weight loss  patient awaiting CPAP results  patient continues to require CPAP at home and is attempting to be compliant with nightly wear  patient continues to require CPAP at home and is attempting to be compliant with nightly wear       Oxygen Discharge (Final Oxygen Re-Evaluation): Oxygen Re-Evaluation - 06/01/17  0829      Program Oxygen Prescription   Program Oxygen Prescription  None      Home Oxygen   Home Oxygen Device  None    Sleep Oxygen Prescription  CPAP    Home Exercise Oxygen Prescription  None    Home at Rest Exercise Oxygen Prescription  None    Compliance with Home Oxygen Use  No      Goals/Expected Outcomes   Comments  patient continues to require CPAP at home and is attempting to be compliant with nightly wear       Initial Exercise Prescription: Initial Exercise Prescription - 03/16/17 0700      Date of Initial Exercise RX and Referring Provider   Date  03/16/17    Referring Provider  Dr. Nelda Marseille      Bike   Level  0.4    Minutes  17      NuStep   Level  2    Minutes  17    METs  1.4      Track   Laps  8    Minutes  17      Prescription Details   Frequency (times per week)  2    Duration  Progress to 45 minutes of aerobic exercise without signs/symptoms of physical distress      Intensity   THRR 40-80% of Max Heartrate  61-122    Ratings of Perceived Exertion  11-13    Perceived Dyspnea  0-4      Progression   Progression  Continue progressive overload as per policy without signs/symptoms or physical distress.      Resistance Training   Training Prescription  Yes    Weight  orange bands    Reps  10-15       Perform Capillary Blood Glucose checks as needed.  Exercise Prescription Changes: Exercise Prescription Changes    Row Name 03/21/17 1228 03/23/17 1200 03/28/17 1200 03/30/17 1200 04/04/17 1154     Response to Exercise   Blood Pressure (Admit)  112/72  110/70  140/60  104/54  122/70   Blood Pressure (Exercise)  132/88  100/70  130/70  110/60  138/54   Blood Pressure (Exit)  100/66  130/86  112/70  100/64  98/56   Heart Rate (Admit)  74 bpm  77 bpm  78 bpm  77 bpm  75 bpm   Heart Rate (Exercise)  80 bpm  94 bpm  92 bpm  83 bpm  82 bpm   Heart Rate (Exit)  76 bpm  83 bpm  80 bpm  80 bpm  79 bpm   Oxygen Saturation (Admit)  95 %  96 %  95 %   92 %  95 %   Oxygen Saturation (Exercise)  88 %  89 %  88 %  92 %  90 %   Oxygen Saturation (Exit)  76 %  91 %  95 %  96 %  94 %   Rating of Perceived Exertion (Exercise)  '12  9  13  9  11   '$ Perceived Dyspnea (Exercise)  0  0  '1  1  1   '$ Duration  Progress to 45 minutes of aerobic exercise without signs/symptoms of physical distress  Progress to 45 minutes of aerobic exercise without signs/symptoms of physical distress  Progress to 45 minutes of aerobic exercise without signs/symptoms of physical distress  Progress to 45 minutes of aerobic exercise without signs/symptoms of physical distress  Progress to 45 minutes of aerobic exercise without signs/symptoms of physical distress   Intensity  Other (comment) 40-80% HRR  Other (comment) 40-80% HRR  Other (comment) 40-80% HRR  Other (comment) 40-80% HRR  Other (comment) 40-80% HRR     Progression   Progression  Continue to progress workloads to maintain intensity without signs/symptoms of physical distress.  Continue to progress workloads to maintain intensity without signs/symptoms of physical distress.  Continue to progress workloads to maintain intensity without signs/symptoms of physical distress.  Continue to progress workloads to maintain intensity without signs/symptoms of physical distress.  Continue to progress workloads to maintain intensity without signs/symptoms of physical distress.     Resistance Training   Training Prescription  Yes  Yes  Yes  Yes  Yes   Weight  orange bands  orange bands  orange bands  orange bands  orange bands   Reps  10-15  10-15  10-15  10-15  10-15     Oxygen   Oxygen  - Room air  - Room air  - Room air  - Room air  - Room air     Bike   Level  0.4  -  0.4  0.4  0.4   Minutes  17  -  '17  17  17     '$ NuStep   Level  '2  2  2  '$ -  2   Minutes  '17  17  17  '$ -  17   METs  1.4  1.9  1.8  -  1.7     Track   Laps  -  7.5  8  -  -   Minutes  -  17  17  -  -   Row Name 04/06/17 1200 04/11/17 1200 04/13/17 1300  04/18/17 1200 04/20/17 1200     Response  to Exercise   Blood Pressure (Admit)  108/54  110/60  100/48  104/60  150/80   Blood Pressure (Exercise)  154/74  150/66  102/60  126/70  144/88   Blood Pressure (Exit)  104/70  124/78  100/60  100/72  154/70   Heart Rate (Admit)  75 bpm  80 bpm  81 bpm  74 bpm  83 bpm   Heart Rate (Exercise)  80 bpm  83 bpm  86 bpm  95 bpm  83 bpm   Heart Rate (Exit)  78 bpm  80 bpm  88 bpm  82 bpm  81 bpm   Oxygen Saturation (Admit)  91 %  90 %  95 %  92 %  93 %   Oxygen Saturation (Exercise)  94 %  92 %  92 %  89 %  92 %   Oxygen Saturation (Exit)  97 %  98 %  94 %  90 %  93 %   Rating of Perceived Exertion (Exercise)  '9  11  13  11  9   '$ Perceived Dyspnea (Exercise)  0  0  0  0  0   Duration  Progress to 45 minutes of aerobic exercise without signs/symptoms of physical distress  Progress to 45 minutes of aerobic exercise without signs/symptoms of physical distress  Progress to 45 minutes of aerobic exercise without signs/symptoms of physical distress  Progress to 45 minutes of aerobic exercise without signs/symptoms of physical distress  Progress to 45 minutes of aerobic exercise without signs/symptoms of physical distress   Intensity  Other (comment) 40-80% HRR  THRR unchanged  THRR unchanged  THRR unchanged  THRR unchanged     Progression   Progression  Continue to progress workloads to maintain intensity without signs/symptoms of physical distress.  Continue to progress workloads to maintain intensity without signs/symptoms of physical distress.  Continue to progress workloads to maintain intensity without signs/symptoms of physical distress.  Continue to progress workloads to maintain intensity without signs/symptoms of physical distress.  Continue to progress workloads to maintain intensity without signs/symptoms of physical distress.     Resistance Training   Training Prescription  Yes  Yes  Yes  Yes  Yes   Weight  orange bands  orange bands  orange bands   orange bands  orange bands   Reps  10-15  10-15  10-15  10-15  10-15   Time  -  10 Minutes  10 Minutes  10 Minutes  10 Minutes     Oxygen   Oxygen  - Room air  -  -  -  -     Bike   Level  -  0.4  0.4  0.4  0.4   Minutes  -  '17  17  17  17     '$ NuStep   Level  3  3  -  4  4   Minutes  17  17  -  17  17   METs  1.6  1.7  -  2.6  2.1     Track   Laps  7  7.5  9.5  9  -   Minutes  '17  17  17  17  '$ -     Home Exercise Plan   Plans to continue exercise at  -  Home (comment)  -  -  -   Frequency  -  Add 1 additional day to program exercise sessions.  -  -  -  Row Name 05/02/17 1200 05/04/17 1400 05/09/17 1200 05/11/17 1200 05/18/17 1200     Response to Exercise   Blood Pressure (Admit)  126/60  106/50  104/60  102/60  104/60   Blood Pressure (Exercise)  114/70  122/60  124/70  120/70  126/60   Blood Pressure (Exit)  102/68  96/60  92/50  90/50  100/54   Heart Rate (Admit)  78 bpm  76 bpm  75 bpm  81 bpm  75 bpm   Heart Rate (Exercise)  82 bpm  87 bpm  85 bpm  86 bpm  87 bpm   Heart Rate (Exit)  82 bpm  84 bpm  82 bpm  86 bpm  82 bpm   Oxygen Saturation (Admit)  94 %  91 %  96 %  94 %  99 %   Oxygen Saturation (Exercise)  92 %  91 %  90 %  91 %  90 %   Oxygen Saturation (Exit)  94 %  92 %  92 %  99 %  94 %   Rating of Perceived Exertion (Exercise)  '11  11  11  11  11   '$ Perceived Dyspnea (Exercise)  '1  1  1  '$ 0  0   Duration  Progress to 45 minutes of aerobic exercise without signs/symptoms of physical distress  Progress to 45 minutes of aerobic exercise without signs/symptoms of physical distress  Progress to 45 minutes of aerobic exercise without signs/symptoms of physical distress  Progress to 45 minutes of aerobic exercise without signs/symptoms of physical distress  Progress to 45 minutes of aerobic exercise without signs/symptoms of physical distress   Intensity  THRR unchanged  THRR unchanged  THRR unchanged  THRR unchanged  THRR unchanged     Progression   Progression  Continue  to progress workloads to maintain intensity without signs/symptoms of physical distress.  Continue to progress workloads to maintain intensity without signs/symptoms of physical distress.  Continue to progress workloads to maintain intensity without signs/symptoms of physical distress.  Continue to progress workloads to maintain intensity without signs/symptoms of physical distress.  Continue to progress workloads to maintain intensity without signs/symptoms of physical distress.     Resistance Training   Training Prescription  Yes  Yes  Yes  Yes  Yes   Weight  orange bands  orange bands  orange bands  orange bands  orange bands   Reps  10-15  10-15  10-15  10-15  10-15   Time  10 Minutes  10 Minutes  10 Minutes  10 Minutes  10 Minutes     Bike   Level  0.4  -  0.4  -  0.4   Minutes  17  -  17  -  17     NuStep   Level  '4  5  5  5  5   '$ Minutes  '17  17  17  17  17   '$ METs  2.1  2  2.1  1.7  2.5     Track   Laps  '10  12  9  11  12   '$ Minutes  '17  17  17  17  17   '$ Row Name 05/23/17 1200             Response to Exercise   Blood Pressure (Admit)  118/64       Blood Pressure (Exercise)  138/68       Blood Pressure (Exit)  100/64       Heart Rate (Admit)  75 bpm       Heart Rate (Exercise)  79 bpm       Heart Rate (Exit)  84 bpm       Oxygen Saturation (Admit)  94 %       Oxygen Saturation (Exercise)  93 %       Oxygen Saturation (Exit)  98 %       Rating of Perceived Exertion (Exercise)  11       Perceived Dyspnea (Exercise)  0       Duration  Progress to 45 minutes of aerobic exercise without signs/symptoms of physical distress       Intensity  THRR unchanged         Progression   Progression  Continue to progress workloads to maintain intensity without signs/symptoms of physical distress.         Resistance Training   Training Prescription  Yes       Weight  orange bands       Reps  10-15       Time  10 Minutes         Bike   Level  0.4       Minutes  17         NuStep    Level  5       Minutes  17       METs  2.9         Track   Laps  10       Minutes  17          Exercise Comments: Exercise Comments    Row Name 04/11/17 1259           Exercise Comments  home exercise completed          Exercise Goals and Review: Exercise Goals    Row Name 03/13/17 2248             Exercise Goals   Increase Physical Activity  Yes       Intervention  Provide advice, education, support and counseling about physical activity/exercise needs.;Develop an individualized exercise prescription for aerobic and resistive training based on initial evaluation findings, risk stratification, comorbidities and participant's personal goals.       Expected Outcomes  Achievement of increased cardiorespiratory fitness and enhanced flexibility, muscular endurance and strength shown through measurements of functional capacity and personal statement of participant.       Increase Strength and Stamina  Yes       Intervention  Provide advice, education, support and counseling about physical activity/exercise needs.;Develop an individualized exercise prescription for aerobic and resistive training based on initial evaluation findings, risk stratification, comorbidities and participant's personal goals.       Expected Outcomes  Achievement of increased cardiorespiratory fitness and enhanced flexibility, muscular endurance and strength shown through measurements of functional capacity and personal statement of participant.       Able to understand and use rate of perceived exertion (RPE) scale  Yes       Intervention  Provide education and explanation on how to use RPE scale       Expected Outcomes  Short Term: Able to use RPE daily in rehab to express subjective intensity level;Long Term:  Able to use RPE to guide intensity level when exercising independently       Able to understand and use Dyspnea scale  Yes  Intervention  Provide education and explanation on how to use  Dyspnea scale       Expected Outcomes  Short Term: Able to use Dyspnea scale daily in rehab to express subjective sense of shortness of breath during exertion;Long Term: Able to use Dyspnea scale to guide intensity level when exercising independently       Knowledge and understanding of Target Heart Rate Range (THRR)  Yes       Intervention  Provide education and explanation of THRR including how the numbers were predicted and where they are located for reference       Expected Outcomes  Short Term: Able to state/look up THRR;Long Term: Able to use THRR to govern intensity when exercising independently;Short Term: Able to use daily as guideline for intensity in rehab       Understanding of Exercise Prescription  Yes       Intervention  Provide education, explanation, and written materials on patient's individual exercise prescription       Expected Outcomes  Short Term: Able to explain program exercise prescription;Long Term: Able to explain home exercise prescription to exercise independently          Exercise Goals Re-Evaluation : Exercise Goals Re-Evaluation    Row Name 03/27/17 1012 04/20/17 0952 05/08/17 1639 05/25/17 0705       Exercise Goal Re-Evaluation   Exercise Goals Review  Increase Physical Activity;Increase Strength and Stamina;Able to understand and use Dyspnea scale;Able to understand and use rate of perceived exertion (RPE) scale;Knowledge and understanding of Target Heart Rate Range (THRR);Understanding of Exercise Prescription  Increase Strength and Stamina;Increase Physical Activity;Able to understand and use rate of perceived exertion (RPE) scale;Understanding of Exercise Prescription;Knowledge and understanding of Target Heart Rate Range (THRR);Able to understand and use Dyspnea scale  Increase Strength and Stamina;Increase Physical Activity;Able to understand and use rate of perceived exertion (RPE) scale;Understanding of Exercise Prescription;Knowledge and understanding of  Target Heart Rate Range (THRR);Able to understand and use Dyspnea scale  Increase Strength and Stamina;Increase Physical Activity;Able to understand and use rate of perceived exertion (RPE) scale;Knowledge and understanding of Target Heart Rate Range (THRR);Understanding of Exercise Prescription;Able to understand and use Dyspnea scale    Comments  Patient has only attended 2 exercise sessions. Will cont. to monitor and progress as able.   Patient is progressing well. Will start to increase intensity now that the patient has been here for a few weeks. Will cont. to monitor and progress as able.   Patient is progressing well. Is up to 12 laps (200 ft each) in 15 minutes. Is open to workload changes. Will cont. to monitor and progress as able.   Patient is progressing well. Is up to 12 laps (200 ft each) in 15 minutes. Is open to workload changes. Will cont. to monitor and progress as able.     Expected Outcomes  Through exercise at rehab and at home, patient will increase strength and stamina and decrease shortness of breath  Through exercise at rehab and at home, patient will increase strength and stamina and also be able to perform ADL's easier.  Through exercise at rehab and at home, patient will increase strength and stamina and also be able to perform ADL's easier.  Through exercise at rehab and at home, patient will increase physical capacity and be able to preform ADL's at home much easier.       Discharge Exercise Prescription (Final Exercise Prescription Changes): Exercise Prescription Changes - 05/23/17 1200  Response to Exercise   Blood Pressure (Admit)  118/64    Blood Pressure (Exercise)  138/68    Blood Pressure (Exit)  100/64    Heart Rate (Admit)  75 bpm    Heart Rate (Exercise)  79 bpm    Heart Rate (Exit)  84 bpm    Oxygen Saturation (Admit)  94 %    Oxygen Saturation (Exercise)  93 %    Oxygen Saturation (Exit)  98 %    Rating of Perceived Exertion (Exercise)  11     Perceived Dyspnea (Exercise)  0    Duration  Progress to 45 minutes of aerobic exercise without signs/symptoms of physical distress    Intensity  THRR unchanged      Progression   Progression  Continue to progress workloads to maintain intensity without signs/symptoms of physical distress.      Resistance Training   Training Prescription  Yes    Weight  orange bands    Reps  10-15    Time  10 Minutes      Bike   Level  0.4    Minutes  17      NuStep   Level  5    Minutes  17    METs  2.9      Track   Laps  10    Minutes  17       Nutrition:  Target Goals: Understanding of nutrition guidelines, daily intake of sodium '1500mg'$ , cholesterol '200mg'$ , calories 30% from fat and 7% or less from saturated fats, daily to have 5 or more servings of fruits and vegetables.  Biometrics: Pre Biometrics - 03/13/17 0922      Pre Biometrics   Grip Strength  20 kg        Nutrition Therapy Plan and Nutrition Goals: Nutrition Therapy & Goals - 03/30/17 1347      Nutrition Therapy   Diet  Renal, Diabetic      Personal Nutrition Goals   Nutrition Goal  Pt to maintain his current wt while in Pulmonary Rehab.      Intervention Plan   Intervention  Prescribe, educate and counsel regarding individualized specific dietary modifications aiming towards targeted core components such as weight, hypertension, lipid management, diabetes, heart failure and other comorbidities.    Expected Outcomes  Short Term Goal: Understand basic principles of dietary content, such as calories, fat, sodium, cholesterol and nutrients.;Long Term Goal: Adherence to prescribed nutrition plan.       Nutrition Discharge: Rate Your Plate Scores: Nutrition Assessments - 03/16/17 1453      Rate Your Plate Scores   Pre Score  55       Nutrition Goals Re-Evaluation:   Nutrition Goals Discharge (Final Nutrition Goals Re-Evaluation):   Psychosocial: Target Goals: Acknowledge presence or absence of significant  depression and/or stress, maximize coping skills, provide positive support system. Participant is able to verbalize types and ability to use techniques and skills needed for reducing stress and depression.  Initial Review & Psychosocial Screening: Initial Psych Review & Screening - 03/13/17 0942      Initial Review   Current issues with  None Identified      Family Dynamics   Good Support System?  Yes      Barriers   Psychosocial barriers to participate in program  There are no identifiable barriers or psychosocial needs.      Screening Interventions   Interventions  Encouraged to exercise       Quality  of Life Scores:   PHQ-9: Recent Review Flowsheet Data    Depression screen [facility] 2/9 03/13/2017 08/21/2015 07/22/2014 05/09/2014 12/03/2012   Decreased Interest 0 0 0 0 0   Down, Depressed, Hopeless 0 0 0 0 0   PHQ - 2 Score 0 0 0 0 0     Interpretation of Total Score  Total Score Depression Severity:  1-4 = Minimal depression, 5-9 = Mild depression, 10-14 = Moderate depression, 15-19 = Moderately severe depression, 20-27 = Severe depression   Psychosocial Evaluation and Intervention: Psychosocial Evaluation - 03/13/17 0943      Psychosocial Evaluation & Interventions   Interventions  Encouraged to exercise with the program and follow exercise prescription    Expected Outcomes  patient will remain free from psysocial barriers to participate in pulmonary rehab    Continue Psychosocial Services   No Follow up required       Psychosocial Re-Evaluation: Psychosocial Re-Evaluation    [place] Name 03/26/17 2137 04/17/17 1614 05/08/17 1728 06/01/17 0829       Psychosocial Re-Evaluation   Current issues with  None Identified  None Identified  None Identified  None Identified    Expected Outcomes  patient will remain free from psychosocial barriers to participation  patient will remain free from psychosocial barriers to participation  patient will remain free from psychosocial barriers  to participation  patient will remain free from psychosocial barriers to participation    Interventions  Encouraged to attend Pulmonary Rehabilitation for the exercise  Encouraged to attend Pulmonary Rehabilitation for the exercise  Encouraged to attend Pulmonary Rehabilitation for the exercise  Encouraged to attend Pulmonary Rehabilitation for the exercise    Continue Psychosocial Services   No Follow up required  No Follow up required  No Follow up required  No Follow up required       Psychosocial Discharge (Final Psychosocial Re-Evaluation): Psychosocial Re-Evaluation - 06/01/17 0829      Psychosocial Re-Evaluation   Current issues with  None Identified    Expected Outcomes  patient will remain free from psychosocial barriers to participation    Interventions  Encouraged to attend Pulmonary Rehabilitation for the exercise    Continue Psychosocial Services   No Follow up required       Education: Education Goals: Education classes will be provided on a weekly basis, covering required topics. Participant will state understanding/return demonstration of topics presented.  Learning Barriers/Preferences: Learning Barriers/Preferences - 03/13/17 0940      Learning Barriers/Preferences   Learning Barriers  None    Learning Preferences  Individual Instruction;Skilled Demonstration;Written Material;Verbal Instruction       Education Topics: Risk Factor Reduction:  -Group instruction that is supported by a PowerPoint presentation. Instructor discusses the definition of a risk factor, different risk factors for pulmonary disease, and how the heart and lungs work together.     Nutrition for Pulmonary Patient:  -Group instruction provided by PowerPoint slides, verbal discussion, and written materials to support subject matter. The instructor gives an explanation and review of healthy diet recommendations, which includes a discussion on weight management, recommendations for fruit and  vegetable consumption, as well as protein, fluid, caffeine, fiber, sodium, sugar, and alcohol. Tips for eating when patients are short of breath are discussed.   Pursed Lip Breathing:  -Group instruction that is supported by demonstration and informational handouts. Instructor discusses the benefits of pursed lip and diaphragmatic breathing and detailed demonstration on how to preform both.     Oxygen Safety:  -Group  instruction provided by PowerPoint, verbal discussion, and written material to support subject matter. There is an overview of "What is Oxygen" and "Why do we need it".  Instructor also reviews how to create a safe environment for oxygen use, the importance of using oxygen as prescribed, and the risks of noncompliance. There is a brief discussion on traveling with oxygen and resources the patient may utilize.   Oxygen Equipment:  -Group instruction provided by [facility] Staff utilizing handouts, written materials, and equipment demonstrations.   PULMONARY REHAB OTHER RESPIRATORY from 05/11/2017 in [place]  Date  05/04/17  Educator  [facility]  Instruction Review Code  2- meets goals/outcomes      Signs and Symptoms:  -Group instruction provided by written material and verbal discussion to support subject matter. Warning signs and symptoms of infection, stroke, and heart attack are reviewed and when to call the physician/911 reinforced. Tips for preventing the spread of infection discussed.   Advanced Directives:  -Group instruction provided by verbal instruction and written material to support subject matter. Instructor reviews Advanced Directive laws and proper instruction for filling out document.   Pulmonary Video:  -Group video education that reviews the importance of medication and oxygen compliance, exercise, good nutrition, pulmonary hygiene, and pursed lip and diaphragmatic breathing for the pulmonary patient.   Exercise for  the Pulmonary Patient:  -Group instruction that is supported by a PowerPoint presentation. Instructor discusses benefits of exercise, core components of exercise, frequency, duration, and intensity of an exercise routine, importance of utilizing pulse oximetry during exercise, safety while exercising, and options of places to exercise outside of rehab.     PULMONARY REHAB OTHER RESPIRATORY from 05/11/2017 in [place]  Date  04/20/17  Educator  Cloyde Reams  Instruction Review Code  2- meets goals/outcomes      Pulmonary Medications:  -Verbally interactive group education provided by instructor with focus on inhaled medications and proper administration.   PULMONARY REHAB OTHER RESPIRATORY from 04/06/2017 in [place]  Date  04/06/17  Educator  Pharm D  Instruction Review Code  2- meets goals/outcomes      Anatomy and Physiology of the Respiratory System and Intimacy:  -Group instruction provided by PowerPoint, verbal discussion, and written material to support subject matter. Instructor reviews respiratory cycle and anatomical components of the respiratory system and their functions. Instructor also reviews differences in obstructive and restrictive respiratory diseases with examples of each. Intimacy, Sex, and Sexuality differences are reviewed with a discussion on how relationships can change when diagnosed with pulmonary disease. Common sexual concerns are reviewed.   MD DAY -A group question and answer session with a medical doctor that allows participants to ask questions that relate to their pulmonary disease state.   OTHER EDUCATION -Group or individual verbal, written, or video instructions that support the educational goals of the pulmonary rehab program.   Knowledge Questionnaire Score:   Core Components/Risk Factors/Patient Goals at Admission: Personal Goals and Risk Factors at Admission - 03/13/17 0942      Core  Components/Risk Factors/Patient Goals on Admission   Improve shortness of breath with ADL's  Yes    Intervention  Provide education, individualized exercise plan and daily activity instruction to help decrease symptoms of SOB with activities of daily living.    Expected Outcomes  Short Term: Achieves a reduction of symptoms when performing activities of daily living.       Core Components/Risk Factors/Patient Goals  Review:  Goals and Risk Factor Review    Row Name 03/26/17 2136 04/17/17 1608 05/08/17 1726 06/01/17 0829       Core Components/Risk Factors/Patient Goals Review   Personal Goals Review  Improve shortness of breath with ADL's  Improve shortness of breath with ADL's  Improve shortness of breath with ADL's  Improve shortness of breath with ADL's    Review  patient has only attended 2 exercise sessions since admission. Too early to evaluate progress towards rehab goals. should see progression over the next 30 days  patient is working hard in pulmonary rehab. he is tolerating workload increases and verbalizes and improvement in his shortness of breath. his bloodsugars are an issue most days and we are trying to find a good combination of the right amount of medications and healthy breakfast/snack to keep his bloodsugars up during exercise. He is working with our diabetes educator on this. He continues to go to dialysis M-W-F which limits his ability to exercise at home on those days.   Patient continues to work hard in pulmonary rehab. He attends 90% of the scheduled sessions. He states he is beginning to see improvement in his physical stamina and strength and his exertional shortness of breath with ADLs and he feels this is helping him tolerate dialysis better.  Patient continues to work hard in pulmonary rehab. He attends 90% of the scheduled sessions. He states he is beginning to see improvement in his physical stamina and strength and his exertional shortness of breath with ADLs and he  feels this is helping him tolerate dialysis better.    Expected Outcomes  see admission goals  see admission goals  see admission goals  see admission goals       Core Components/Risk Factors/Patient Goals at Discharge (Final Review):  Goals and Risk Factor Review - 06/01/17 0829      Core Components/Risk Factors/Patient Goals Review   Personal Goals Review  Improve shortness of breath with ADL's    Review  Patient continues to work hard in pulmonary rehab. He attends 90% of the scheduled sessions. He states he is beginning to see improvement in his physical stamina and strength and his exertional shortness of breath with ADLs and he feels this is helping him tolerate dialysis better.    Expected Outcomes  see admission goals       ITP Comments:   Comments: Patient has attended 16 sessions since admission to pulmonary rehab.

## 2017-06-02 DIAGNOSIS — T8579XA Infection and inflammatory reaction due to other internal prosthetic devices, implants and grafts, initial encounter: Secondary | ICD-10-CM | POA: Diagnosis not present

## 2017-06-02 DIAGNOSIS — E1129 Type 2 diabetes mellitus with other diabetic kidney complication: Secondary | ICD-10-CM | POA: Diagnosis not present

## 2017-06-02 DIAGNOSIS — N2581 Secondary hyperparathyroidism of renal origin: Secondary | ICD-10-CM | POA: Diagnosis not present

## 2017-06-02 DIAGNOSIS — N186 End stage renal disease: Secondary | ICD-10-CM | POA: Diagnosis not present

## 2017-06-02 DIAGNOSIS — E876 Hypokalemia: Secondary | ICD-10-CM | POA: Diagnosis not present

## 2017-06-02 DIAGNOSIS — D631 Anemia in chronic kidney disease: Secondary | ICD-10-CM | POA: Diagnosis not present

## 2017-06-04 DIAGNOSIS — D631 Anemia in chronic kidney disease: Secondary | ICD-10-CM | POA: Diagnosis not present

## 2017-06-04 DIAGNOSIS — E1129 Type 2 diabetes mellitus with other diabetic kidney complication: Secondary | ICD-10-CM | POA: Diagnosis not present

## 2017-06-04 DIAGNOSIS — N186 End stage renal disease: Secondary | ICD-10-CM | POA: Diagnosis not present

## 2017-06-04 DIAGNOSIS — N2581 Secondary hyperparathyroidism of renal origin: Secondary | ICD-10-CM | POA: Diagnosis not present

## 2017-06-04 DIAGNOSIS — T8579XA Infection and inflammatory reaction due to other internal prosthetic devices, implants and grafts, initial encounter: Secondary | ICD-10-CM | POA: Diagnosis not present

## 2017-06-04 DIAGNOSIS — E876 Hypokalemia: Secondary | ICD-10-CM | POA: Diagnosis not present

## 2017-06-05 DIAGNOSIS — Z992 Dependence on renal dialysis: Secondary | ICD-10-CM | POA: Diagnosis not present

## 2017-06-05 DIAGNOSIS — N186 End stage renal disease: Secondary | ICD-10-CM | POA: Diagnosis not present

## 2017-06-05 DIAGNOSIS — T86819 Unspecified complication of lung transplant: Secondary | ICD-10-CM | POA: Diagnosis not present

## 2017-06-07 DIAGNOSIS — N186 End stage renal disease: Secondary | ICD-10-CM | POA: Diagnosis not present

## 2017-06-07 DIAGNOSIS — E1129 Type 2 diabetes mellitus with other diabetic kidney complication: Secondary | ICD-10-CM | POA: Diagnosis not present

## 2017-06-07 DIAGNOSIS — D631 Anemia in chronic kidney disease: Secondary | ICD-10-CM | POA: Diagnosis not present

## 2017-06-07 DIAGNOSIS — N2581 Secondary hyperparathyroidism of renal origin: Secondary | ICD-10-CM | POA: Diagnosis not present

## 2017-06-07 DIAGNOSIS — T8579XA Infection and inflammatory reaction due to other internal prosthetic devices, implants and grafts, initial encounter: Secondary | ICD-10-CM | POA: Diagnosis not present

## 2017-06-07 DIAGNOSIS — E876 Hypokalemia: Secondary | ICD-10-CM | POA: Diagnosis not present

## 2017-06-08 ENCOUNTER — Telehealth (HOSPITAL_COMMUNITY): Payer: Self-pay | Admitting: Internal Medicine

## 2017-06-08 ENCOUNTER — Encounter (HOSPITAL_COMMUNITY): Payer: Medicare Other

## 2017-06-09 DIAGNOSIS — D631 Anemia in chronic kidney disease: Secondary | ICD-10-CM | POA: Diagnosis not present

## 2017-06-09 DIAGNOSIS — N186 End stage renal disease: Secondary | ICD-10-CM | POA: Diagnosis not present

## 2017-06-09 DIAGNOSIS — E1129 Type 2 diabetes mellitus with other diabetic kidney complication: Secondary | ICD-10-CM | POA: Diagnosis not present

## 2017-06-09 DIAGNOSIS — N2581 Secondary hyperparathyroidism of renal origin: Secondary | ICD-10-CM | POA: Diagnosis not present

## 2017-06-09 DIAGNOSIS — T8579XA Infection and inflammatory reaction due to other internal prosthetic devices, implants and grafts, initial encounter: Secondary | ICD-10-CM | POA: Diagnosis not present

## 2017-06-09 DIAGNOSIS — E876 Hypokalemia: Secondary | ICD-10-CM | POA: Diagnosis not present

## 2017-06-12 DIAGNOSIS — T8579XA Infection and inflammatory reaction due to other internal prosthetic devices, implants and grafts, initial encounter: Secondary | ICD-10-CM | POA: Diagnosis not present

## 2017-06-12 DIAGNOSIS — N186 End stage renal disease: Secondary | ICD-10-CM | POA: Diagnosis not present

## 2017-06-12 DIAGNOSIS — E1129 Type 2 diabetes mellitus with other diabetic kidney complication: Secondary | ICD-10-CM | POA: Diagnosis not present

## 2017-06-12 DIAGNOSIS — N2581 Secondary hyperparathyroidism of renal origin: Secondary | ICD-10-CM | POA: Diagnosis not present

## 2017-06-12 DIAGNOSIS — D631 Anemia in chronic kidney disease: Secondary | ICD-10-CM | POA: Diagnosis not present

## 2017-06-12 DIAGNOSIS — E876 Hypokalemia: Secondary | ICD-10-CM | POA: Diagnosis not present

## 2017-06-13 ENCOUNTER — Encounter (HOSPITAL_COMMUNITY)
Admission: RE | Admit: 2017-06-13 | Discharge: 2017-06-13 | Disposition: A | Discharge status: Home / Self Care | Payer: Medicare Other | Source: Ambulatory Visit | Attending: Pulmonary Disease | Admitting: Pulmonary Disease

## 2017-06-13 VITALS — Wt 144.4 lb

## 2017-06-13 DIAGNOSIS — E1122 Type 2 diabetes mellitus with diabetic chronic kidney disease: Secondary | ICD-10-CM | POA: Insufficient documentation

## 2017-06-13 DIAGNOSIS — N186 End stage renal disease: Secondary | ICD-10-CM | POA: Diagnosis not present

## 2017-06-13 DIAGNOSIS — G4733 Obstructive sleep apnea (adult) (pediatric): Secondary | ICD-10-CM | POA: Diagnosis not present

## 2017-06-13 DIAGNOSIS — Z7952 Long term (current) use of systemic steroids: Secondary | ICD-10-CM | POA: Diagnosis not present

## 2017-06-13 DIAGNOSIS — Z87891 Personal history of nicotine dependence: Secondary | ICD-10-CM | POA: Diagnosis not present

## 2017-06-13 DIAGNOSIS — I120 Hypertensive chronic kidney disease with stage 5 chronic kidney disease or end stage renal disease: Secondary | ICD-10-CM | POA: Diagnosis not present

## 2017-06-13 DIAGNOSIS — Z7982 Long term (current) use of aspirin: Secondary | ICD-10-CM | POA: Diagnosis not present

## 2017-06-13 DIAGNOSIS — Z942 Lung transplant status: Secondary | ICD-10-CM | POA: Diagnosis not present

## 2017-06-13 DIAGNOSIS — Z992 Dependence on renal dialysis: Secondary | ICD-10-CM | POA: Diagnosis not present

## 2017-06-13 DIAGNOSIS — K219 Gastro-esophageal reflux disease without esophagitis: Secondary | ICD-10-CM | POA: Insufficient documentation

## 2017-06-13 DIAGNOSIS — Z79899 Other long term (current) drug therapy: Secondary | ICD-10-CM | POA: Diagnosis not present

## 2017-06-13 DIAGNOSIS — Z794 Long term (current) use of insulin: Secondary | ICD-10-CM | POA: Insufficient documentation

## 2017-06-13 NOTE — Progress Notes (Signed)
Daily Session Note  Patient Details  Name: [name] MRN: [id number] Date of Birth: 05/06/1951 Referring Provider:     Pulmonary Rehab Walk Test from 03/14/2017 in [place]  Referring Provider  Dr. Nelda Marseille      Encounter Date: 06/13/2017  Check In: Session Check In - 06/13/17 1030      Check-In   Location  [facility]    Staff Present  Rosebud Poles, RN, Luisa Hart, RN, BSN;Rheta Hemmelgarn, MS, ACSM RCEP, Exercise Physiologist;Lisa Ysidro Evert, RN    Supervising physician immediately available to respond to emergencies  [facility] immediately available    Physician(s)  Dr. Eliseo Squires    Medication changes reported      No    Fall or balance concerns reported     No    Tobacco Cessation  No Change    Warm-up and Cool-down  Performed as group-led instruction    Resistance Training Performed  Yes    VAD Patient?  No      Pain Assessment   Currently in Pain?  No/denies    Multiple Pain Sites  No       Capillary Blood Glucose: No results found for this or any previous visit (from the past 24 hour(s)).  Exercise Prescription Changes - 06/13/17 1200      Response to Exercise   Blood Pressure (Admit)  120/70    Blood Pressure (Exercise)  114/70    Blood Pressure (Exit)  106/60    Heart Rate (Admit)  82 bpm    Heart Rate (Exercise)  82 bpm    Heart Rate (Exit)  76 bpm    Oxygen Saturation (Admit)  96 %    Oxygen Saturation (Exercise)  94 %    Oxygen Saturation (Exit)  94 %    Rating of Perceived Exertion (Exercise)  13    Perceived Dyspnea (Exercise)  0    Duration  Progress to 45 minutes of aerobic exercise without signs/symptoms of physical distress    Intensity  THRR unchanged      Progression   Progression  Continue to progress workloads to maintain intensity without signs/symptoms of physical distress.      Resistance Training   Training Prescription  Yes    Weight  orange bands    Reps  10-15    Time   10 Minutes      Bike   Level  0.6    Minutes  17      NuStep   Level  6    Minutes  17    METs  2.4      Track   Laps  7    Minutes  17       Social History   Tobacco Use  Smoking Status Former Smoker  . Packs/day: 0.30  . Years: 20.00  . Pack years: 6.00  . Types: Cigarettes  . Last attempt to quit: 06/06/2002  . Years since quitting: 15.0  Smokeless Tobacco Never Used    Goals Met:  Exercise tolerated well No report of cardiac concerns or symptoms Strength training completed today  Goals Unmet:  Not Applicable  Comments: Service time is from 10:30a to 12:00p    Dr. Rush Farmer is Medical Director for Pulmonary Rehab at [facility].

## 2017-06-14 DIAGNOSIS — T8579XA Infection and inflammatory reaction due to other internal prosthetic devices, implants and grafts, initial encounter: Secondary | ICD-10-CM | POA: Diagnosis not present

## 2017-06-14 DIAGNOSIS — E876 Hypokalemia: Secondary | ICD-10-CM | POA: Diagnosis not present

## 2017-06-14 DIAGNOSIS — N2581 Secondary hyperparathyroidism of renal origin: Secondary | ICD-10-CM | POA: Diagnosis not present

## 2017-06-14 DIAGNOSIS — E1129 Type 2 diabetes mellitus with other diabetic kidney complication: Secondary | ICD-10-CM | POA: Diagnosis not present

## 2017-06-14 DIAGNOSIS — D631 Anemia in chronic kidney disease: Secondary | ICD-10-CM | POA: Diagnosis not present

## 2017-06-14 DIAGNOSIS — N186 End stage renal disease: Secondary | ICD-10-CM | POA: Diagnosis not present

## 2017-06-15 ENCOUNTER — Encounter (HOSPITAL_COMMUNITY)
Admission: RE | Admit: 2017-06-15 | Discharge: 2017-06-15 | Disposition: A | Discharge status: Home / Self Care | Payer: Medicare Other | Source: Ambulatory Visit | Attending: Pulmonary Disease | Admitting: Pulmonary Disease

## 2017-06-15 VITALS — Wt 141.3 lb

## 2017-06-15 DIAGNOSIS — Z794 Long term (current) use of insulin: Secondary | ICD-10-CM | POA: Diagnosis not present

## 2017-06-15 DIAGNOSIS — Z7982 Long term (current) use of aspirin: Secondary | ICD-10-CM | POA: Diagnosis not present

## 2017-06-15 DIAGNOSIS — Z7952 Long term (current) use of systemic steroids: Secondary | ICD-10-CM | POA: Diagnosis not present

## 2017-06-15 DIAGNOSIS — Z942 Lung transplant status: Secondary | ICD-10-CM

## 2017-06-15 DIAGNOSIS — K219 Gastro-esophageal reflux disease without esophagitis: Secondary | ICD-10-CM | POA: Diagnosis not present

## 2017-06-15 DIAGNOSIS — Z79899 Other long term (current) drug therapy: Secondary | ICD-10-CM | POA: Diagnosis not present

## 2017-06-15 NOTE — Progress Notes (Signed)
Daily Session Note  Patient Details  Name: [name] MRN: [id number] Date of Birth: [birth year] Referring Provider:     Pulmonary Rehab Walk Test from 03/14/2017 in [place]  Referring Provider  Dr. Nelda Marseille      Encounter Date: 06/15/2017  Check In: Session Check In - 06/15/17 1237      Check-In   Location  [facility]    Staff Present  Rodney Langton, RN;Portia Rollene Rotunda, RN, BSN;Kizer Nobbe, MS, ACSM RCEP, Exercise Physiologist;Joan Leonia Reeves, RN, BSN    Supervising physician immediately available to respond to emergencies  [facility] immediately available       Capillary Blood Glucose: No results found for this or any previous visit (from the past 24 hour(s)). POCT Glucose - 06/15/17 1238      POCT Blood Glucose   Pre-Exercise  144 mg/dL    Post-Exercise  124 mg/dL      Exercise Prescription Changes - 06/15/17 1200      Response to Exercise   Blood Pressure (Admit)  96/60    Blood Pressure (Exercise)  110/66    Blood Pressure (Exit)  100/60    Heart Rate (Admit)  81 bpm    Heart Rate (Exercise)  85 bpm    Heart Rate (Exit)  83 bpm    Oxygen Saturation (Admit)  96 %    Oxygen Saturation (Exercise)  94 %    Oxygen Saturation (Exit)  96 %    Rating of Perceived Exertion (Exercise)  11    Perceived Dyspnea (Exercise)  0    Duration  Progress to 45 minutes of aerobic exercise without signs/symptoms of physical distress    Intensity  THRR unchanged      Progression   Progression  Continue to progress workloads to maintain intensity without signs/symptoms of physical distress.      Resistance Training   Training Prescription  Yes    Weight  orange bands    Reps  10-15    Time  10 Minutes      Bike   Level  0.6    Minutes  17      Track   Laps  9    Minutes  17       Social History   Tobacco Use  Smoking Status Former Smoker  . Packs/day: 0.30  . Years: 20.00  . Pack years: 6.00  . Types:  Cigarettes  . Last attempt to quit: 06/06/2002  . Years since quitting: 15.0  Smokeless Tobacco Never Used    Goals Met:  Exercise tolerated well No report of cardiac concerns or symptoms Strength training completed today  Goals Unmet:  Not Applicable  Comments: Service time is from 10:30a to 12:30p    Dr. Rush Farmer is Medical Director for Pulmonary Rehab at [facility].

## 2017-06-16 DIAGNOSIS — N186 End stage renal disease: Secondary | ICD-10-CM | POA: Diagnosis not present

## 2017-06-16 DIAGNOSIS — N2581 Secondary hyperparathyroidism of renal origin: Secondary | ICD-10-CM | POA: Diagnosis not present

## 2017-06-16 DIAGNOSIS — T8579XA Infection and inflammatory reaction due to other internal prosthetic devices, implants and grafts, initial encounter: Secondary | ICD-10-CM | POA: Diagnosis not present

## 2017-06-16 DIAGNOSIS — E876 Hypokalemia: Secondary | ICD-10-CM | POA: Diagnosis not present

## 2017-06-16 DIAGNOSIS — E1129 Type 2 diabetes mellitus with other diabetic kidney complication: Secondary | ICD-10-CM | POA: Diagnosis not present

## 2017-06-16 DIAGNOSIS — D631 Anemia in chronic kidney disease: Secondary | ICD-10-CM | POA: Diagnosis not present

## 2017-06-19 DIAGNOSIS — N186 End stage renal disease: Secondary | ICD-10-CM | POA: Diagnosis not present

## 2017-06-19 DIAGNOSIS — N2581 Secondary hyperparathyroidism of renal origin: Secondary | ICD-10-CM | POA: Diagnosis not present

## 2017-06-19 DIAGNOSIS — T8579XA Infection and inflammatory reaction due to other internal prosthetic devices, implants and grafts, initial encounter: Secondary | ICD-10-CM | POA: Diagnosis not present

## 2017-06-19 DIAGNOSIS — E876 Hypokalemia: Secondary | ICD-10-CM | POA: Diagnosis not present

## 2017-06-19 DIAGNOSIS — D631 Anemia in chronic kidney disease: Secondary | ICD-10-CM | POA: Diagnosis not present

## 2017-06-19 DIAGNOSIS — E1129 Type 2 diabetes mellitus with other diabetic kidney complication: Secondary | ICD-10-CM | POA: Diagnosis not present

## 2017-06-20 ENCOUNTER — Encounter (HOSPITAL_COMMUNITY)
Admission: RE | Admit: 2017-06-20 | Discharge: 2017-06-20 | Disposition: A | Discharge status: Home / Self Care | Payer: Medicare Other | Source: Ambulatory Visit | Attending: Pulmonary Disease | Admitting: Pulmonary Disease

## 2017-06-20 VITALS — Wt 141.3 lb

## 2017-06-20 DIAGNOSIS — Z942 Lung transplant status: Secondary | ICD-10-CM | POA: Diagnosis not present

## 2017-06-20 DIAGNOSIS — Z7982 Long term (current) use of aspirin: Secondary | ICD-10-CM | POA: Diagnosis not present

## 2017-06-20 DIAGNOSIS — K219 Gastro-esophageal reflux disease without esophagitis: Secondary | ICD-10-CM | POA: Diagnosis not present

## 2017-06-20 DIAGNOSIS — Z79899 Other long term (current) drug therapy: Secondary | ICD-10-CM | POA: Diagnosis not present

## 2017-06-20 DIAGNOSIS — Z7952 Long term (current) use of systemic steroids: Secondary | ICD-10-CM | POA: Diagnosis not present

## 2017-06-20 DIAGNOSIS — Z794 Long term (current) use of insulin: Secondary | ICD-10-CM | POA: Diagnosis not present

## 2017-06-20 NOTE — Progress Notes (Signed)
Daily Session Note  Patient Details  Name: [name] MRN: [id number] Date of Birth: [birth year] Referring Provider:     Pulmonary Rehab Walk Test from 03/14/2017 in [place]  Referring Provider  Dr. Nelda Marseille      Encounter Date: 06/20/2017  Check In: Session Check In - 06/20/17 1030      Check-In   Location  [facility]    Staff Present  Rosebud Poles, RN, Luisa Hart, RN, BSN;Molly diVincenzo, MS, ACSM RCEP, Exercise Physiologist;Areli Jowett Ysidro Evert, RN    Supervising physician immediately available to respond to emergencies  [facility] immediately available    Physician(s)  Dr. Eliseo Squires    Medication changes reported      No    Fall or balance concerns reported     No    Tobacco Cessation  No Change    Warm-up and Cool-down  Performed as group-led instruction    Resistance Training Performed  Yes    VAD Patient?  No      Pain Assessment   Currently in Pain?  No/denies    Multiple Pain Sites  No       Capillary Blood Glucose: No results found for this or any previous visit (from the past 24 hour(s)). POCT Glucose - 06/20/17 1209      POCT Blood Glucose   Pre-Exercise  124 mg/dL    Pre-Exercise #2  124 mg/dL      Exercise Prescription Changes - 06/20/17 1200      Response to Exercise   Blood Pressure (Admit)  110/64    Blood Pressure (Exercise)  130/64    Blood Pressure (Exit)  102/60    Heart Rate (Admit)  76 bpm    Heart Rate (Exercise)  82 bpm    Heart Rate (Exit)  79 bpm    Oxygen Saturation (Admit)  95 %    Oxygen Saturation (Exercise)  92 %    Oxygen Saturation (Exit)  96 %    Rating of Perceived Exertion (Exercise)  11    Perceived Dyspnea (Exercise)  1    Duration  Progress to 45 minutes of aerobic exercise without signs/symptoms of physical distress    Intensity  THRR unchanged      Progression   Progression  Continue to progress workloads to maintain intensity without signs/symptoms of physical  distress.      Resistance Training   Training Prescription  Yes    Weight  orange bands    Reps  10-15    Time  10 Minutes      Bike   Level  0.6    Minutes  17      NuStep   Level  6    Minutes  17    METs  2.4      Track   Laps  11    Minutes  17       Social History   Tobacco Use  Smoking Status Former Smoker  . Packs/day: 0.30  . Years: 20.00  . Pack years: 6.00  . Types: Cigarettes  . Last attempt to quit: 06/06/2002  . Years since quitting: 15.0  Smokeless Tobacco Never Used    Goals Met:  Exercise tolerated well No report of cardiac concerns or symptoms Strength training completed today  Goals Unmet:  Not Applicable  Comments: Service time is from 1030 to 1200    Dr. Rush Farmer is Medical Director for Pulmonary Rehab at  [facility].

## 2017-06-21 DIAGNOSIS — N186 End stage renal disease: Secondary | ICD-10-CM | POA: Diagnosis not present

## 2017-06-21 DIAGNOSIS — D631 Anemia in chronic kidney disease: Secondary | ICD-10-CM | POA: Diagnosis not present

## 2017-06-21 DIAGNOSIS — E1129 Type 2 diabetes mellitus with other diabetic kidney complication: Secondary | ICD-10-CM | POA: Diagnosis not present

## 2017-06-21 DIAGNOSIS — T8579XA Infection and inflammatory reaction due to other internal prosthetic devices, implants and grafts, initial encounter: Secondary | ICD-10-CM | POA: Diagnosis not present

## 2017-06-21 DIAGNOSIS — N2581 Secondary hyperparathyroidism of renal origin: Secondary | ICD-10-CM | POA: Diagnosis not present

## 2017-06-21 DIAGNOSIS — E876 Hypokalemia: Secondary | ICD-10-CM | POA: Diagnosis not present

## 2017-06-22 ENCOUNTER — Encounter (HOSPITAL_COMMUNITY)
Admission: RE | Admit: 2017-06-22 | Discharge: 2017-06-22 | Disposition: A | Discharge status: Home / Self Care | Payer: Medicare Other | Source: Ambulatory Visit | Attending: Pulmonary Disease | Admitting: Pulmonary Disease

## 2017-06-22 VITALS — Wt 142.0 lb

## 2017-06-22 DIAGNOSIS — Z942 Lung transplant status: Secondary | ICD-10-CM | POA: Diagnosis not present

## 2017-06-22 DIAGNOSIS — Z794 Long term (current) use of insulin: Secondary | ICD-10-CM | POA: Diagnosis not present

## 2017-06-22 DIAGNOSIS — H40051 Ocular hypertension, right eye: Secondary | ICD-10-CM | POA: Diagnosis not present

## 2017-06-22 DIAGNOSIS — T8579XA Infection and inflammatory reaction due to other internal prosthetic devices, implants and grafts, initial encounter: Secondary | ICD-10-CM | POA: Diagnosis not present

## 2017-06-22 DIAGNOSIS — Z7982 Long term (current) use of aspirin: Secondary | ICD-10-CM | POA: Diagnosis not present

## 2017-06-22 DIAGNOSIS — K219 Gastro-esophageal reflux disease without esophagitis: Secondary | ICD-10-CM | POA: Diagnosis not present

## 2017-06-22 DIAGNOSIS — Z7952 Long term (current) use of systemic steroids: Secondary | ICD-10-CM | POA: Diagnosis not present

## 2017-06-22 DIAGNOSIS — Z79899 Other long term (current) drug therapy: Secondary | ICD-10-CM | POA: Diagnosis not present

## 2017-06-22 DIAGNOSIS — H40043 Steroid responder, bilateral: Secondary | ICD-10-CM | POA: Diagnosis not present

## 2017-06-22 NOTE — Progress Notes (Signed)
Daily Session Note  Patient Details  Name: [name] MRN: [id number] Date of Birth: 11-14-1950 Referring Provider:     Pulmonary Rehab Walk Test from 03/14/2017 in [place]  Referring Provider  Dr. Nelda Marseille      Encounter Date: 06/22/2017  Check In: Session Check In - 06/22/17 1056      Check-In   Location  [facility]    Staff Present  Trish Fountain, RN, BSN;Daneya Hartgrove Ysidro Evert, RN;Molly diVincenzo, MS, ACSM RCEP, Exercise Physiologist;Joan Leonia Reeves, RN, BSN    Supervising physician immediately available to respond to emergencies  [facility] immediately available    Physician(s)  Dr. Cathlean Sauer    Medication changes reported      No    Fall or balance concerns reported     No    Tobacco Cessation  No Change    Warm-up and Cool-down  Performed as group-led instruction    Resistance Training Performed  Yes    VAD Patient?  No      Pain Assessment   Currently in Pain?  No/denies    Multiple Pain Sites  No       Capillary Blood Glucose: No results found for this or any previous visit (from the past 24 hour(s)). POCT Glucose - 06/22/17 1235      POCT Blood Glucose   Pre-Exercise  172 mg/dL    Post-Exercise  154 mg/dL      Exercise Prescription Changes - 06/22/17 1200      Response to Exercise   Blood Pressure (Admit)  94/50    Blood Pressure (Exercise)  110/60    Blood Pressure (Exit)  98/58    Heart Rate (Admit)  83 bpm    Heart Rate (Exercise)  84 bpm    Heart Rate (Exit)  88 bpm    Oxygen Saturation (Admit)  94 %    Oxygen Saturation (Exercise)  92 %    Oxygen Saturation (Exit)  98 %    Rating of Perceived Exertion (Exercise)  11    Perceived Dyspnea (Exercise)  0    Duration  Progress to 45 minutes of aerobic exercise without signs/symptoms of physical distress    Intensity  THRR unchanged      Progression   Progression  Continue to progress workloads to maintain intensity without signs/symptoms of physical  distress.      Resistance Training   Training Prescription  Yes    Weight  orange bands    Reps  10-15    Time  10 Minutes      NuStep   Level  6    Minutes  17    METs  2.5      Track   Laps  13    Minutes  17       Social History   Tobacco Use  Smoking Status Former Smoker  . Packs/day: 0.30  . Years: 20.00  . Pack years: 6.00  . Types: Cigarettes  . Last attempt to quit: 06/06/2002  . Years since quitting: 15.0  Smokeless Tobacco Never Used    Goals Met:  Exercise tolerated well No report of cardiac concerns or symptoms Strength training completed today  Goals Unmet:  Not Applicable  Comments: Service time is from 1030 to 1215    Dr. Rush Farmer is Medical Director for Pulmonary Rehab at [facility].

## 2017-06-24 DIAGNOSIS — E876 Hypokalemia: Secondary | ICD-10-CM | POA: Diagnosis not present

## 2017-06-24 DIAGNOSIS — T8579XA Infection and inflammatory reaction due to other internal prosthetic devices, implants and grafts, initial encounter: Secondary | ICD-10-CM | POA: Diagnosis not present

## 2017-06-24 DIAGNOSIS — D631 Anemia in chronic kidney disease: Secondary | ICD-10-CM | POA: Diagnosis not present

## 2017-06-24 DIAGNOSIS — N2581 Secondary hyperparathyroidism of renal origin: Secondary | ICD-10-CM | POA: Diagnosis not present

## 2017-06-24 DIAGNOSIS — N186 End stage renal disease: Secondary | ICD-10-CM | POA: Diagnosis not present

## 2017-06-24 DIAGNOSIS — E1129 Type 2 diabetes mellitus with other diabetic kidney complication: Secondary | ICD-10-CM | POA: Diagnosis not present

## 2017-06-26 DIAGNOSIS — E1129 Type 2 diabetes mellitus with other diabetic kidney complication: Secondary | ICD-10-CM | POA: Diagnosis not present

## 2017-06-26 DIAGNOSIS — D631 Anemia in chronic kidney disease: Secondary | ICD-10-CM | POA: Diagnosis not present

## 2017-06-26 DIAGNOSIS — N186 End stage renal disease: Secondary | ICD-10-CM | POA: Diagnosis not present

## 2017-06-26 DIAGNOSIS — T8579XA Infection and inflammatory reaction due to other internal prosthetic devices, implants and grafts, initial encounter: Secondary | ICD-10-CM | POA: Diagnosis not present

## 2017-06-26 DIAGNOSIS — E876 Hypokalemia: Secondary | ICD-10-CM | POA: Diagnosis not present

## 2017-06-26 DIAGNOSIS — N2581 Secondary hyperparathyroidism of renal origin: Secondary | ICD-10-CM | POA: Diagnosis not present

## 2017-06-27 ENCOUNTER — Encounter (HOSPITAL_COMMUNITY)
Admission: RE | Admit: 2017-06-27 | Discharge: 2017-06-27 | Disposition: A | Discharge status: Home / Self Care | Payer: Medicare Other | Source: Ambulatory Visit | Attending: Internal Medicine | Admitting: Internal Medicine

## 2017-06-27 VITALS — Wt 142.4 lb

## 2017-06-27 DIAGNOSIS — Z942 Lung transplant status: Secondary | ICD-10-CM | POA: Diagnosis not present

## 2017-06-27 DIAGNOSIS — Z7952 Long term (current) use of systemic steroids: Secondary | ICD-10-CM | POA: Diagnosis not present

## 2017-06-27 DIAGNOSIS — K219 Gastro-esophageal reflux disease without esophagitis: Secondary | ICD-10-CM | POA: Diagnosis not present

## 2017-06-27 DIAGNOSIS — Z7982 Long term (current) use of aspirin: Secondary | ICD-10-CM | POA: Diagnosis not present

## 2017-06-27 DIAGNOSIS — Z79899 Other long term (current) drug therapy: Secondary | ICD-10-CM | POA: Diagnosis not present

## 2017-06-27 DIAGNOSIS — Z794 Long term (current) use of insulin: Secondary | ICD-10-CM | POA: Diagnosis not present

## 2017-06-27 NOTE — Progress Notes (Signed)
Pulmonary Individual Treatment Plan  Patient Details  Name: [name] MRN: [id number] Date of Birth: May 09, 1951 Referring Provider:     Pulmonary Rehab Walk Test from 03/14/2017 in [place]  Referring Provider  Dr. Nelda Marseille      Initial Encounter Date:    Pulmonary Rehab Walk Test from 03/14/2017 in [place]  Date  03/16/17  Referring Provider  Dr. Nelda Marseille      Visit Diagnosis: Status post lung transplantation [facility])  Patient's Home Medications on Admission:   Current Outpatient Medications:  .  ACCU-CHEK FASTCLIX LANCETS MISC, As directed up to 4 times daily, Disp: , Rfl: 3 .  acetaminophen (TYLENOL) 500 MG tablet, Take 500-1,000 mg by mouth every 6 (six) hours as needed for headache., Disp: , Rfl:  .  aspirin 81 MG tablet, Take 81 mg by mouth daily. , Disp: , Rfl:  .  azaTHIOprine (IMURAN) 50 MG tablet, Take 1/2 tablet (25 mg) every Monday, Wednesday and Friday after HD., Disp: , Rfl:  .  calcium acetate (PHOSLO) 667 MG capsule, Take 1,334 mg by mouth 3 (three) times daily before meals. , Disp: , Rfl:  .  cycloSPORINE modified (NEORAL) 25 MG capsule, Take 75 mg by mouth 2 (two) times daily. , Disp: , Rfl:  .  cycloSPORINE modified (NEORAL) 25 MG capsule, Take by mouth., Disp: , Rfl:  .  doxercalciferol (HECTOROL) 4 MCG/2ML injection, Inject into the vein., Disp: , Rfl:  .  fluticasone (FLONASE) 50 MCG/ACT nasal spray, Place 2 sprays into the nose daily. (Patient taking differently: Place 2 sprays into the nose daily as needed for allergies. ), Disp: 30 g, Rfl: 2 .  hydrOXYzine (ATARAX/VISTARIL) 25 MG tablet, Take by mouth., Disp: , Rfl:  .  insulin regular (NOVOLIN R) 100 units/mL injection, Inject 5 Units into the skin 3 (three) times daily before meals. Additional units (per sliding scale): BGL 201-250 = 1 unit; 251-300 = 2 units; 301-350 = 3 units; 351-400 = units; >401 = 5 units + CALL LUNG COORDINATOR @  [facility], Disp: , Rfl:  .  insulin regular (NOVOLIN R,HUMULIN R) 100 units/mL injection, 201 - 250 mg/dL 1 units, 251 - 300 mg/dL 2 units, 301 - 350 mg/dL 3 units, >350 mg/dL Notify Provider, Disp: , Rfl:  .  latanoprost (XALATAN) 0.005 % ophthalmic solution, Place 1 drop into both eyes at bedtime., Disp: , Rfl:  .  metoCLOPramide (REGLAN) 5 MG tablet, Take by mouth., Disp: , Rfl:  .  midodrine (PROAMATINE) 10 MG tablet, Take by mouth., Disp: , Rfl:  .  Multiple Vitamin (MULTIVITAMIN WITH MINERALS) TABS tablet, Take 1 tablet by mouth daily., Disp: , Rfl:  .  omeprazole (PRILOSEC) 20 MG capsule, Take by mouth., Disp: , Rfl:  .  ondansetron (ZOFRAN) 4 MG tablet, Take 4 mg by mouth every 8 (eight) hours as needed for nausea or vomiting. , Disp: , Rfl:  .  polyvinyl alcohol-povidone (REFRESH) 1.4-0.6 % ophthalmic solution, Place 1-2 drops into both eyes daily as needed (for dryness)., Disp: , Rfl:  .  pravastatin (PRAVACHOL) 20 MG tablet, Take 20 mg by mouth every evening. , Disp: , Rfl:  .  predniSONE (DELTASONE) 5 MG tablet, Take by mouth., Disp: , Rfl:  .  sertraline (ZOLOFT) 50 MG tablet, Take 50 mg by mouth daily., Disp: , Rfl:  .  sulfamethoxazole-trimethoprim (BACTRIM,SEPTRA) 400-80 MG tablet, Take 1 tablet by mouth 3 (three) times a week. Monday,  Wednesday, Friday, Disp: , Rfl:  .  valGANciclovir (VALCYTE) 450 MG tablet, Take 1 tablet by mouth See admin instructions. Every Monday and Friday AFTER DIALYSIS, Disp: , Rfl:   Past Medical History: Past Medical History:  Diagnosis Date  . Aortic valve disorders   . Benign neoplasm of colon   . Degeneration of intervertebral disc, site unspecified   . Diabetes mellitus without complication ([place])   . 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 Esophageal reflux   . ESRD (end stage renal disease) on dialysis ([place])   . Essential hypertension   . Obstructive sleep apnea (adult) (pediatric)   . Osteoarthrosis, unspecified  whether generalized or localized, unspecified site   . Other and unspecified hyperlipidemia   . Pneumonia    interstitial pneumonia  . Pulmonary fibrosis ([place])   . Renal disorder   . Respiratory failure with hypoxia ([place]) 12/2015  . Shortness of breath dyspnea   . Transplanted, lung ([place])   . Unspecified essential hypertension     Tobacco Use: Social History   Tobacco Use  Smoking Status Former Smoker  . Packs/day: 0.30  . Years: 20.00  . Pack years: 6.00  . Types: Cigarettes  . Last attempt to quit: 06/06/2002  . Years since quitting: 15.0  Smokeless Tobacco Never Used    Labs: Recent Chemical engineer    Labs for ITP Cardiac and Pulmonary Rehab Latest Ref Rng & Units 04/19/2015 12/30/2015 01/01/2016 01/01/2016 01/01/2016   Hemoglobin A1c <5.7 % - - - - -   PHART 7.350 - 7.450 7.299(L) 7.368 7.458(H) 7.393 7.381   PCO2ART 35.0 - 45.0 mmHg 40.5 39.6 37.2 45.2(H) 42.4   HCO3 20.0 - 24.0 mEq/L 19.3(L) 22.7 26.0(H) 27.1(H) 24.7(H)   TCO2 0 - 100 mmol/L 20.5 24 27.'1 28 26   '$ ACIDBASEDEF 0.0 - 2.0 mmol/L 6.0(H) 2.0 - - -   O2SAT % - 91.0 91.1 100.0 94.0      Capillary Blood Glucose: Lab Results  Component Value Date   GLUCAP 166 (H) 04/11/2017   GLUCAP 144 (H) 04/06/2017   GLUCAP 122 (H) 01/01/2016   GLUCAP 82 01/01/2016   GLUCAP 83 01/01/2016   POCT Glucose    Row Name 03/21/17 1226 03/23/17 1245 03/28/17 1213 04/04/17 1156 04/06/17 1245     POCT Blood Glucose   Pre-Exercise  143 mg/dL  250 mg/dL  143 mg/dL  108 mg/dL  127 mg/dL   Post-Exercise  75 mg/dL  179 mg/dL  113 mg/dL  86 mg/dL  144 mg/dL   Post-Exercise #2  68 mg/dL  -  -  -  -   Post-Exercise #3  88 mg/dL  -  -  -  -   Row Name 04/11/17 1222 04/13/17 1317 04/18/17 1254 04/20/17 1233 05/02/17 1240     POCT Blood Glucose   Pre-Exercise  151 mg/dL  154 mg/dL  139 mg/dL  179 mg/dL  119 mg/dL snack ate before exercise   Post-Exercise  166 mg/dL  103 mg/dL  119 mg/dL  123 mg/dL  126 mg/dL   Row Name 05/04/17  1402 05/09/17 1219 05/11/17 1249 05/18/17 1227 06/01/17 1221     POCT Blood Glucose   Pre-Exercise  117 mg/dL  117 mg/dL  134 mg/dL  124 mg/dL  128 mg/dL   Post-Exercise  172 mg/dL  124 mg/dL  222 mg/dL  122 mg/dL  111 mg/dL   Row Name 06/13/17 1215 06/15/17 1238 06/20/17 1209 06/22/17 1235  POCT Blood Glucose   Pre-Exercise  200 mg/dL  144 mg/dL  124 mg/dL  172 mg/dL    Post-Exercise  129 mg/dL  124 mg/dL  -  154 mg/dL    Pre-Exercise #2  -  -  124 mg/dL  -       Pulmonary Assessment Scores: Pulmonary Assessment Scores    Row Name 03/16/17 0724         ADL UCSD   ADL Phase  Entry       mMRC Score   mMRC Score  1        Pulmonary Function Assessment: Pulmonary Function Assessment - 03/13/17 0941      Breath   Bilateral Breath Sounds  Other    Other  right lung clear but deminished throughout, left upper clear, fine crackles mid to lower base    Shortness of Breath  No       Exercise Target Goals:    Exercise Program Goal: Individual exercise prescription set using results from initial 6 min walk test and THRR while considering  patient's activity barriers and safety.    Exercise Prescription Goal: Initial exercise prescription builds to 30-45 minutes a day of aerobic activity, 2-3 days per week.  Home exercise guidelines will be given to patient during program as part of exercise prescription that the participant will acknowledge.  Activity Barriers & Risk Stratification: Activity Barriers & Cardiac Risk Stratification - 03/13/17 0921      Activity Barriers & Cardiac Risk Stratification   Activity Barriers  Deconditioning;Balance Concerns       6 Minute Walk: 6 Minute Walk    Row Name 03/16/17 0716         6 Minute Walk   Phase  Initial     Distance  1000 feet     Walk Time  6 minutes     # of Rest Breaks  0     MPH  1.89     METS  2.45     RPE  12     Perceived Dyspnea   0     Symptoms  Yes (comment)     Comments  5/10 calf pain      Resting HR  77 bpm     Resting BP  105/62     Resting Oxygen Saturation   93 %     Exercise Oxygen Saturation  during 6 min walk  88 %     Max Ex. HR  91 bpm     Max Ex. BP  182/79     2 Minute Post BP  173/82 159/80       Interval HR   1 Minute HR  77     2 Minute HR  82     3 Minute HR  84     4 Minute HR  89     5 Minute HR  90     6 Minute HR  91     Interval Heart Rate?  Yes       Interval Oxygen   Interval Oxygen?  Yes     Baseline Oxygen Saturation %  93 %     1 Minute Oxygen Saturation %  93 %     1 Minute Liters of Oxygen  0 L     2 Minute Oxygen Saturation %  89 %     2 Minute Liters of Oxygen  0 L     3 Minute Oxygen Saturation %  89 %     3 Minute Liters of Oxygen  0 L     4 Minute Oxygen Saturation %  90 %     4 Minute Liters of Oxygen  0 L     5 Minute Oxygen Saturation %  88 %     5 Minute Liters of Oxygen  0 L     6 Minute Oxygen Saturation %  90 %     6 Minute Liters of Oxygen  0 L        Oxygen Initial Assessment: Oxygen Initial Assessment - 03/16/17 0724      Initial 6 min Walk   Oxygen Used  None      Program Oxygen Prescription   Program Oxygen Prescription  None       Oxygen Re-Evaluation: Oxygen Re-Evaluation    Row Name 03/26/17 2135 04/17/17 1608 05/08/17 1725 06/01/17 0829 06/26/17 1519     Program Oxygen Prescription   Program Oxygen Prescription  None  None  None  None  None     Home Oxygen   Home Oxygen Device  None  None  None  None  None   Sleep Oxygen Prescription  CPAP  CPAP  CPAP  CPAP  CPAP   Home Exercise Oxygen Prescription  None  None  None  None  None   Home at Rest Exercise Oxygen Prescription  None  None  None  None  None   Compliance with Home Oxygen Use  No  No  No  No  No     Goals/Expected Outcomes   Comments  MD recently ordered an overnight oximetry to determine if patient still need CPAP since lung transplant and significant weight loss  patient awaiting CPAP results  patient continues to require CPAP at home  and is attempting to be compliant with nightly wear  patient continues to require CPAP at home and is attempting to be compliant with nightly wear  patient continues to require CPAP at home and is attempting to be compliant with nightly wear      Oxygen Discharge (Final Oxygen Re-Evaluation): Oxygen Re-Evaluation - 06/26/17 1519      Program Oxygen Prescription   Program Oxygen Prescription  None      Home Oxygen   Home Oxygen Device  None    Sleep Oxygen Prescription  CPAP    Home Exercise Oxygen Prescription  None    Home at Rest Exercise Oxygen Prescription  None    Compliance with Home Oxygen Use  No      Goals/Expected Outcomes   Comments  patient continues to require CPAP at home and is attempting to be compliant with nightly wear       Initial Exercise Prescription: Initial Exercise Prescription - 03/16/17 0700      Date of Initial Exercise RX and Referring Provider   Date  03/16/17    Referring Provider  Dr. Nelda Marseille      Bike   Level  0.4    Minutes  17      NuStep   Level  2    Minutes  17    METs  1.4      Track   Laps  8    Minutes  17      Prescription Details   Frequency (times per week)  2    Duration  Progress to 45 minutes of aerobic exercise without signs/symptoms of physical distress      Intensity  THRR 40-80% of Max Heartrate  61-122    Ratings of Perceived Exertion  11-13    Perceived Dyspnea  0-4      Progression   Progression  Continue progressive overload as per policy without signs/symptoms or physical distress.      Resistance Training   Training Prescription  Yes    Weight  orange bands    Reps  10-15       Perform Capillary Blood Glucose checks as needed.  Exercise Prescription Changes: Exercise Prescription Changes    Row Name 03/21/17 1228 03/23/17 1200 03/28/17 1200 03/30/17 1200 04/04/17 1154     Response to Exercise   Blood Pressure (Admit)  112/72  110/70  140/60  104/54  122/70   Blood Pressure (Exercise)  132/88   100/70  130/70  110/60  138/54   Blood Pressure (Exit)  100/66  130/86  112/70  100/64  98/56   Heart Rate (Admit)  74 bpm  77 bpm  78 bpm  77 bpm  75 bpm   Heart Rate (Exercise)  80 bpm  94 bpm  92 bpm  83 bpm  82 bpm   Heart Rate (Exit)  76 bpm  83 bpm  80 bpm  80 bpm  79 bpm   Oxygen Saturation (Admit)  95 %  96 %  95 %  92 %  95 %   Oxygen Saturation (Exercise)  88 %  89 %  88 %  92 %  90 %   Oxygen Saturation (Exit)  76 %  91 %  95 %  96 %  94 %   Rating of Perceived Exertion (Exercise)  '12  9  13  9  11   '$ Perceived Dyspnea (Exercise)  0  0  '1  1  1   '$ Duration  Progress to 45 minutes of aerobic exercise without signs/symptoms of physical distress  Progress to 45 minutes of aerobic exercise without signs/symptoms of physical distress  Progress to 45 minutes of aerobic exercise without signs/symptoms of physical distress  Progress to 45 minutes of aerobic exercise without signs/symptoms of physical distress  Progress to 45 minutes of aerobic exercise without signs/symptoms of physical distress   Intensity  Other (comment) 40-80% HRR  Other (comment) 40-80% HRR  Other (comment) 40-80% HRR  Other (comment) 40-80% HRR  Other (comment) 40-80% HRR     Progression   Progression  Continue to progress workloads to maintain intensity without signs/symptoms of physical distress.  Continue to progress workloads to maintain intensity without signs/symptoms of physical distress.  Continue to progress workloads to maintain intensity without signs/symptoms of physical distress.  Continue to progress workloads to maintain intensity without signs/symptoms of physical distress.  Continue to progress workloads to maintain intensity without signs/symptoms of physical distress.     Resistance Training   Training Prescription  Yes  Yes  Yes  Yes  Yes   Weight  orange bands  orange bands  orange bands  orange bands  orange bands   Reps  10-15  10-15  10-15  10-15  10-15     Oxygen   Oxygen  - Room air  - Room air   - Room air  - Room air  - Room air     Bike   Level  0.4  -  0.4  0.4  0.4   Minutes  17  -  '17  17  17     '$ NuStep   Level  2  2  2  -  2   Minutes  '17  17  17  '$ -  17   METs  1.4  1.9  1.8  -  1.7     Track   Laps  -  7.5  8  -  -   Minutes  -  17  17  -  -   Row Name 04/06/17 1200 04/11/17 1200 04/13/17 1300 04/18/17 1200 04/20/17 1200     Response to Exercise   Blood Pressure (Admit)  108/54  110/60  100/48  104/60  150/80   Blood Pressure (Exercise)  154/74  150/66  102/60  126/70  144/88   Blood Pressure (Exit)  104/70  124/78  100/60  100/72  154/70   Heart Rate (Admit)  75 bpm  80 bpm  81 bpm  74 bpm  83 bpm   Heart Rate (Exercise)  80 bpm  83 bpm  86 bpm  95 bpm  83 bpm   Heart Rate (Exit)  78 bpm  80 bpm  88 bpm  82 bpm  81 bpm   Oxygen Saturation (Admit)  91 %  90 %  95 %  92 %  93 %   Oxygen Saturation (Exercise)  94 %  92 %  92 %  89 %  92 %   Oxygen Saturation (Exit)  97 %  98 %  94 %  90 %  93 %   Rating of Perceived Exertion (Exercise)  '9  11  13  11  9   '$ Perceived Dyspnea (Exercise)  0  0  0  0  0   Duration  Progress to 45 minutes of aerobic exercise without signs/symptoms of physical distress  Progress to 45 minutes of aerobic exercise without signs/symptoms of physical distress  Progress to 45 minutes of aerobic exercise without signs/symptoms of physical distress  Progress to 45 minutes of aerobic exercise without signs/symptoms of physical distress  Progress to 45 minutes of aerobic exercise without signs/symptoms of physical distress   Intensity  Other (comment) 40-80% HRR  THRR unchanged  THRR unchanged  THRR unchanged  THRR unchanged     Progression   Progression  Continue to progress workloads to maintain intensity without signs/symptoms of physical distress.  Continue to progress workloads to maintain intensity without signs/symptoms of physical distress.  Continue to progress workloads to maintain intensity without signs/symptoms of physical distress.   Continue to progress workloads to maintain intensity without signs/symptoms of physical distress.  Continue to progress workloads to maintain intensity without signs/symptoms of physical distress.     Resistance Training   Training Prescription  Yes  Yes  Yes  Yes  Yes   Weight  orange bands  orange bands  orange bands  orange bands  orange bands   Reps  10-15  10-15  10-15  10-15  10-15   Time  -  10 Minutes  10 Minutes  10 Minutes  10 Minutes     Oxygen   Oxygen  - Room air  -  -  -  -     Bike   Level  -  0.4  0.4  0.4  0.4   Minutes  -  '17  17  17  17     '$ NuStep   Level  3  3  -  4  4   Minutes  17  17  -  17  17   METs  1.6  1.7  -  2.6  2.1     Track   Laps  7  7.5  9.5  9  -   Minutes  '17  17  17  17  '$ -     Home Exercise Plan   Plans to continue exercise at  -  Home (comment)  -  -  -   Frequency  -  Add 1 additional day to program exercise sessions.  -  -  -   Row Name 05/02/17 1200 05/04/17 1400 05/09/17 1200 05/11/17 1200 05/18/17 1200     Response to Exercise   Blood Pressure (Admit)  126/60  106/50  104/60  102/60  104/60   Blood Pressure (Exercise)  114/70  122/60  124/70  120/70  126/60   Blood Pressure (Exit)  102/68  96/60  92/50  90/50  100/54   Heart Rate (Admit)  78 bpm  76 bpm  75 bpm  81 bpm  75 bpm   Heart Rate (Exercise)  82 bpm  87 bpm  85 bpm  86 bpm  87 bpm   Heart Rate (Exit)  82 bpm  84 bpm  82 bpm  86 bpm  82 bpm   Oxygen Saturation (Admit)  94 %  91 %  96 %  94 %  99 %   Oxygen Saturation (Exercise)  92 %  91 %  90 %  91 %  90 %   Oxygen Saturation (Exit)  94 %  92 %  92 %  99 %  94 %   Rating of Perceived Exertion (Exercise)  '11  11  11  11  11   '$ Perceived Dyspnea (Exercise)  '1  1  1  '$ 0  0   Duration  Progress to 45 minutes of aerobic exercise without signs/symptoms of physical distress  Progress to 45 minutes of aerobic exercise without signs/symptoms of physical distress  Progress to 45 minutes of aerobic exercise without signs/symptoms of  physical distress  Progress to 45 minutes of aerobic exercise without signs/symptoms of physical distress  Progress to 45 minutes of aerobic exercise without signs/symptoms of physical distress   Intensity  THRR unchanged  THRR unchanged  THRR unchanged  THRR unchanged  THRR unchanged     Progression   Progression  Continue to progress workloads to maintain intensity without signs/symptoms of physical distress.  Continue to progress workloads to maintain intensity without signs/symptoms of physical distress.  Continue to progress workloads to maintain intensity without signs/symptoms of physical distress.  Continue to progress workloads to maintain intensity without signs/symptoms of physical distress.  Continue to progress workloads to maintain intensity without signs/symptoms of physical distress.     Resistance Training   Training Prescription  Yes  Yes  Yes  Yes  Yes   Weight  orange bands  orange bands  orange bands  orange bands  orange bands   Reps  10-15  10-15  10-15  10-15  10-15   Time  10 Minutes  10 Minutes  10 Minutes  10 Minutes  10 Minutes     Bike   Level  0.4  -  0.4  -  0.4   Minutes  17  -  17  -  17     NuStep   Level  '4  5  5  5  5   '$ Minutes  '17  17  17  17  17   '$ METs  2.1  2  2.1  1.7  2.5     Track   Laps  '10  12  9  11  12   '$ Minutes  '17  17  17  17  17   '$ Row Name 05/23/17 1200 06/01/17 1200 06/13/17 1200 06/15/17 1200 06/20/17 1200     Response to Exercise   Blood Pressure (Admit)  118/64  126/64  120/70  96/60  110/64   Blood Pressure (Exercise)  138/68  136/70  114/70  110/66  130/64   Blood Pressure (Exit)  100/64  110/68  106/60  100/60  102/60   Heart Rate (Admit)  75 bpm  75 bpm  82 bpm  81 bpm  76 bpm   Heart Rate (Exercise)  79 bpm  76 bpm  82 bpm  85 bpm  82 bpm   Heart Rate (Exit)  84 bpm  80 bpm  76 bpm  83 bpm  79 bpm   Oxygen Saturation (Admit)  94 %  97 %  96 %  96 %  95 %   Oxygen Saturation (Exercise)  93 %  90 %  94 %  94 %  92 %   Oxygen  Saturation (Exit)  98 %  98 %  94 %  96 %  96 %   Rating of Perceived Exertion (Exercise)  '11  13  13  11  11   '$ Perceived Dyspnea (Exercise)  0  0  0  0  1   Duration  Progress to 45 minutes of aerobic exercise without signs/symptoms of physical distress  Progress to 45 minutes of aerobic exercise without signs/symptoms of physical distress  Progress to 45 minutes of aerobic exercise without signs/symptoms of physical distress  Progress to 45 minutes of aerobic exercise without signs/symptoms of physical distress  Progress to 45 minutes of aerobic exercise without signs/symptoms of physical distress   Intensity  THRR unchanged  THRR unchanged  THRR unchanged  THRR unchanged  THRR unchanged     Progression   Progression  Continue to progress workloads to maintain intensity without signs/symptoms of physical distress.  Continue to progress workloads to maintain intensity without signs/symptoms of physical distress.  Continue to progress workloads to maintain intensity without signs/symptoms of physical distress.  Continue to progress workloads to maintain intensity without signs/symptoms of physical distress.  Continue to progress workloads to maintain intensity without signs/symptoms of physical distress.     Resistance Training   Training Prescription  Yes  Yes  Yes  Yes  Yes   Weight  orange bands  orange bands  orange bands  orange bands  orange bands   Reps  10-15  10-15  10-15  10-15  10-15   Time  10 Minutes  10 Minutes  10 Minutes  10 Minutes  10 Minutes     Bike   Level  0.4  0.6  0.6  0.6  0.6   Minutes  '17  17  17  17  17     '$ NuStep   Level  '5  5  6  '$ -  6   Minutes  '17  17  17  '$ -  17   METs  2.9  2.2  2.4  -  2.4     Track   Laps  '10  10  7  9  11   '$ Minutes  '17  17  17  17  17   '$ Row Name 06/22/17 1200  Response to Exercise   Blood Pressure (Admit)  94/50       Blood Pressure (Exercise)  110/60       Blood Pressure (Exit)  98/58       Heart Rate (Admit)  83 bpm        Heart Rate (Exercise)  84 bpm       Heart Rate (Exit)  88 bpm       Oxygen Saturation (Admit)  94 %       Oxygen Saturation (Exercise)  92 %       Oxygen Saturation (Exit)  98 %       Rating of Perceived Exertion (Exercise)  11       Perceived Dyspnea (Exercise)  0       Duration  Progress to 45 minutes of aerobic exercise without signs/symptoms of physical distress       Intensity  THRR unchanged         Progression   Progression  Continue to progress workloads to maintain intensity without signs/symptoms of physical distress.         Resistance Training   Training Prescription  Yes       Weight  orange bands       Reps  10-15       Time  10 Minutes         NuStep   Level  6       Minutes  17       METs  2.5         Track   Laps  13       Minutes  17          Exercise Comments: Exercise Comments    Row Name 04/11/17 1259           Exercise Comments  home exercise completed          Exercise Goals and Review: Exercise Goals    Row Name 03/13/17 5885             Exercise Goals   Increase Physical Activity  Yes       Intervention  Provide advice, education, support and counseling about physical activity/exercise needs.;Develop an individualized exercise prescription for aerobic and resistive training based on initial evaluation findings, risk stratification, comorbidities and participant's personal goals.       Expected Outcomes  Achievement of increased cardiorespiratory fitness and enhanced flexibility, muscular endurance and strength shown through measurements of functional capacity and personal statement of participant.       Increase Strength and Stamina  Yes       Intervention  Provide advice, education, support and counseling about physical activity/exercise needs.;Develop an individualized exercise prescription for aerobic and resistive training based on initial evaluation findings, risk stratification, comorbidities and participant's personal goals.        Expected Outcomes  Achievement of increased cardiorespiratory fitness and enhanced flexibility, muscular endurance and strength shown through measurements of functional capacity and personal statement of participant.       Able to understand and use rate of perceived exertion (RPE) scale  Yes       Intervention  Provide education and explanation on how to use RPE scale       Expected Outcomes  Short Term: Able to use RPE daily in rehab to express subjective intensity level;Long Term:  Able to use RPE to guide intensity level when exercising independently       Able to understand  and use Dyspnea scale  Yes       Intervention  Provide education and explanation on how to use Dyspnea scale       Expected Outcomes  Short Term: Able to use Dyspnea scale daily in rehab to express subjective sense of shortness of breath during exertion;Long Term: Able to use Dyspnea scale to guide intensity level when exercising independently       Knowledge and understanding of Target Heart Rate Range (THRR)  Yes       Intervention  Provide education and explanation of THRR including how the numbers were predicted and where they are located for reference       Expected Outcomes  Short Term: Able to state/look up THRR;Long Term: Able to use THRR to govern intensity when exercising independently;Short Term: Able to use daily as guideline for intensity in rehab       Understanding of Exercise Prescription  Yes       Intervention  Provide education, explanation, and written materials on patient's individual exercise prescription       Expected Outcomes  Short Term: Able to explain program exercise prescription;Long Term: Able to explain home exercise prescription to exercise independently          Exercise Goals Re-Evaluation : Exercise Goals Re-Evaluation    Row Name 03/27/17 1012 04/20/17 0952 05/08/17 1639 05/25/17 0705 06/26/17 0745     Exercise Goal Re-Evaluation   Exercise Goals Review  Increase Physical  Activity;Increase Strength and Stamina;Able to understand and use Dyspnea scale;Able to understand and use rate of perceived exertion (RPE) scale;Knowledge and understanding of Target Heart Rate Range (THRR);Understanding of Exercise Prescription  Increase Strength and Stamina;Increase Physical Activity;Able to understand and use rate of perceived exertion (RPE) scale;Understanding of Exercise Prescription;Knowledge and understanding of Target Heart Rate Range (THRR);Able to understand and use Dyspnea scale  Increase Strength and Stamina;Increase Physical Activity;Able to understand and use rate of perceived exertion (RPE) scale;Understanding of Exercise Prescription;Knowledge and understanding of Target Heart Rate Range (THRR);Able to understand and use Dyspnea scale  Increase Strength and Stamina;Increase Physical Activity;Able to understand and use rate of perceived exertion (RPE) scale;Knowledge and understanding of Target Heart Rate Range (THRR);Understanding of Exercise Prescription;Able to understand and use Dyspnea scale  Increase Strength and Stamina;Increase Physical Activity;Able to understand and use rate of perceived exertion (RPE) scale;Knowledge and understanding of Target Heart Rate Range (THRR);Understanding of Exercise Prescription;Able to understand and use Dyspnea scale   Comments  Patient has only attended 2 exercise sessions. Will cont. to monitor and progress as able.   Patient is progressing well. Will start to increase intensity now that the patient has been here for a few weeks. Will cont. to monitor and progress as able.   Patient is progressing well. Is up to 12 laps (200 ft each) in 15 minutes. Is open to workload changes. Will cont. to monitor and progress as able.   Patient is progressing well. Is up to 12 laps (200 ft each) in 15 minutes. Is open to workload changes. Will cont. to monitor and progress as able.   Patient is progressing well. Is up to 13 laps (200 ft each) in 15  minutes. Is open to workload changes. Will cont. to monitor and progress as able. Will encourage him to attend Pulmonary Maintenance upon graduation.    Expected Outcomes  Through exercise at rehab and at home, patient will increase strength and stamina and decrease shortness of breath  Through exercise at rehab and at  home, patient will increase strength and stamina and also be able to perform ADL's easier.  Through exercise at rehab and at home, patient will increase strength and stamina and also be able to perform ADL's easier.  Through exercise at rehab and at home, patient will increase physical capacity and be able to preform ADL's at home much easier.  Through exercise at rehab and at home, patient will increase strength and stamina and be less short of breath with ADL's at home.       Discharge Exercise Prescription (Final Exercise Prescription Changes): Exercise Prescription Changes - 06/22/17 1200      Response to Exercise   Blood Pressure (Admit)  94/50    Blood Pressure (Exercise)  110/60    Blood Pressure (Exit)  98/58    Heart Rate (Admit)  83 bpm    Heart Rate (Exercise)  84 bpm    Heart Rate (Exit)  88 bpm    Oxygen Saturation (Admit)  94 %    Oxygen Saturation (Exercise)  92 %    Oxygen Saturation (Exit)  98 %    Rating of Perceived Exertion (Exercise)  11    Perceived Dyspnea (Exercise)  0    Duration  Progress to 45 minutes of aerobic exercise without signs/symptoms of physical distress    Intensity  THRR unchanged      Progression   Progression  Continue to progress workloads to maintain intensity without signs/symptoms of physical distress.      Resistance Training   Training Prescription  Yes    Weight  orange bands    Reps  10-15    Time  10 Minutes      NuStep   Level  6    Minutes  17    METs  2.5      Track   Laps  13    Minutes  17       Nutrition:  Target Goals: Understanding of nutrition guidelines, daily intake of sodium '1500mg'$ , cholesterol  '200mg'$ , calories 30% from fat and 7% or less from saturated fats, daily to have 5 or more servings of fruits and vegetables.  Biometrics: Pre Biometrics - 03/13/17 0922      Pre Biometrics   Grip Strength  20 kg        Nutrition Therapy Plan and Nutrition Goals: Nutrition Therapy & Goals - 03/30/17 1347      Nutrition Therapy   Diet  Renal, Diabetic      Personal Nutrition Goals   Nutrition Goal  Pt to maintain his current wt while in Pulmonary Rehab.      Intervention Plan   Intervention  Prescribe, educate and counsel regarding individualized specific dietary modifications aiming towards targeted core components such as weight, hypertension, lipid management, diabetes, heart failure and other comorbidities.    Expected Outcomes  Short Term Goal: Understand basic principles of dietary content, such as calories, fat, sodium, cholesterol and nutrients.;Long Term Goal: Adherence to prescribed nutrition plan.       Nutrition Assessments: Nutrition Assessments - 03/16/17 1453      Rate Your Plate Scores   Pre Score  55       Nutrition Goals Re-Evaluation:   Nutrition Goals Discharge (Final Nutrition Goals Re-Evaluation):   Psychosocial: Target Goals: Acknowledge presence or absence of significant depression and/or stress, maximize coping skills, provide positive support system. Participant is able to verbalize types and ability to use techniques and skills needed for reducing stress and depression.  Initial Review & Psychosocial Screening: Initial Psych Review & Screening - 03/13/17 0942      Initial Review   Current issues with  None Identified      Family Dynamics   Good Support System?  Yes      Barriers   Psychosocial barriers to participate in program  There are no identifiable barriers or psychosocial needs.      Screening Interventions   Interventions  Encouraged to exercise       Quality of Life Scores:  Scores of 19 and below usually indicate a  poorer quality of life in these areas.  A difference of  2-3 points is a clinically meaningful difference.  A difference of 2-3 points in the total score of the Quality of Life Index has been associated with significant improvement in overall quality of life, self-image, physical symptoms, and general health in studies assessing change in quality of life.   PHQ-9: Recent Review Flowsheet Data    Depression screen [facility] 2/9 03/13/2017 08/21/2015 07/22/2014 05/09/2014 12/03/2012   Decreased Interest 0 0 0 0 0   Down, Depressed, Hopeless 0 0 0 0 0   PHQ - 2 Score 0 0 0 0 0     Interpretation of Total Score  Total Score Depression Severity:  1-4 = Minimal depression, 5-9 = Mild depression, 10-14 = Moderate depression, 15-19 = Moderately severe depression, 20-27 = Severe depression   Psychosocial Evaluation and Intervention: Psychosocial Evaluation - 03/13/17 0943      Psychosocial Evaluation & Interventions   Interventions  Encouraged to exercise with the program and follow exercise prescription    Expected Outcomes  patient will remain free from psysocial barriers to participate in pulmonary rehab    Continue Psychosocial Services   No Follow up required       Psychosocial Re-Evaluation: Psychosocial Re-Evaluation    [place] Name 03/26/17 2137 04/17/17 1614 05/08/17 1728 06/01/17 0829 06/26/17 1209     Psychosocial Re-Evaluation   Current issues with  None Identified  None Identified  None Identified  None Identified  None Identified   Expected Outcomes  patient will remain free from psychosocial barriers to participation  patient will remain free from psychosocial barriers to participation  patient will remain free from psychosocial barriers to participation  patient will remain free from psychosocial barriers to participation  patient will remain free from psychosocial barriers to participation   Interventions  Encouraged to attend Pulmonary Rehabilitation for the exercise  Encouraged to attend  Pulmonary Rehabilitation for the exercise  Encouraged to attend Pulmonary Rehabilitation for the exercise  Encouraged to attend Pulmonary Rehabilitation for the exercise  Encouraged to attend Pulmonary Rehabilitation for the exercise   Continue Psychosocial Services   No Follow up required  No Follow up required  No Follow up required  No Follow up required  No Follow up required      Psychosocial Discharge (Final Psychosocial Re-Evaluation): Psychosocial Re-Evaluation - 06/26/17 1209      Psychosocial Re-Evaluation   Current issues with  None Identified    Expected Outcomes  patient will remain free from psychosocial barriers to participation    Interventions  Encouraged to attend Pulmonary Rehabilitation for the exercise    Continue Psychosocial Services   No Follow up required       Education: Education Goals: Education classes will be provided on a weekly basis, covering required topics. Participant will state understanding/return demonstration of topics presented.  Learning Barriers/Preferences: Learning Barriers/Preferences - 03/13/17 0940  Learning Barriers/Preferences   Learning Barriers  None    Learning Preferences  Individual Instruction;Skilled Demonstration;Written Material;Verbal Instruction       Education Topics: Risk Factor Reduction:  -Group instruction that is supported by a PowerPoint presentation. Instructor discusses the definition of a risk factor, different risk factors for pulmonary disease, and how the heart and lungs work together.     Nutrition for Pulmonary Patient:  -Group instruction provided by PowerPoint slides, verbal discussion, and written materials to support subject matter. The instructor gives an explanation and review of healthy diet recommendations, which includes a discussion on weight management, recommendations for fruit and vegetable consumption, as well as protein, fluid, caffeine, fiber, sodium, sugar, and alcohol. Tips for eating  when patients are short of breath are discussed.   Pursed Lip Breathing:  -Group instruction that is supported by demonstration and informational handouts. Instructor discusses the benefits of pursed lip and diaphragmatic breathing and detailed demonstration on how to preform both.     Oxygen Safety:  -Group instruction provided by PowerPoint, verbal discussion, and written material to support subject matter. There is an overview of "What is Oxygen" and "Why do we need it".  Instructor also reviews how to create a safe environment for oxygen use, the importance of using oxygen as prescribed, and the risks of noncompliance. There is a brief discussion on traveling with oxygen and resources the patient may utilize.   Oxygen Equipment:  -Group instruction provided by [facility] Staff utilizing handouts, written materials, and equipment demonstrations.   PULMONARY REHAB OTHER RESPIRATORY from 06/22/2017 in [place]  Date  05/04/17  Educator  [facility]  Instruction Review Code  2- meets goals/outcomes      Signs and Symptoms:  -Group instruction provided by written material and verbal discussion to support subject matter. Warning signs and symptoms of infection, stroke, and heart attack are reviewed and when to call the physician/911 reinforced. Tips for preventing the spread of infection discussed.   Advanced Directives:  -Group instruction provided by verbal instruction and written material to support subject matter. Instructor reviews Advanced Directive laws and proper instruction for filling out document.   Pulmonary Video:  -Group video education that reviews the importance of medication and oxygen compliance, exercise, good nutrition, pulmonary hygiene, and pursed lip and diaphragmatic breathing for the pulmonary patient.   Exercise for the Pulmonary Patient:  -Group instruction that is supported by a PowerPoint presentation. Instructor  discusses benefits of exercise, core components of exercise, frequency, duration, and intensity of an exercise routine, importance of utilizing pulse oximetry during exercise, safety while exercising, and options of places to exercise outside of rehab.     PULMONARY REHAB OTHER RESPIRATORY from 06/22/2017 in [place]  Date  04/20/17  Educator  Cloyde Reams  Instruction Review Code  2- meets goals/outcomes      Pulmonary Medications:  -Verbally interactive group education provided by instructor with focus on inhaled medications and proper administration.   PULMONARY REHAB OTHER RESPIRATORY from 06/22/2017 in [place]  Date  06/22/17  Educator  pharm  Instruction Review Code  2- meets goals/outcomes      Anatomy and Physiology of the Respiratory System and Intimacy:  -Group instruction provided by PowerPoint, verbal discussion, and written material to support subject matter. Instructor reviews respiratory cycle and anatomical components of the respiratory system and their functions. Instructor also reviews differences in obstructive and restrictive respiratory diseases with examples of  each. Intimacy, Sex, and Sexuality differences are reviewed with a discussion on how relationships can change when diagnosed with pulmonary disease. Common sexual concerns are reviewed.   MD DAY -A group question and answer session with a medical doctor that allows participants to ask questions that relate to their pulmonary disease state.   OTHER EDUCATION -Group or individual verbal, written, or video instructions that support the educational goals of the pulmonary rehab program.   Knowledge Questionnaire Score:   Core Components/Risk Factors/Patient Goals at Admission: Personal Goals and Risk Factors at Admission - 03/13/17 0942      Core Components/Risk Factors/Patient Goals on Admission   Improve shortness of breath with ADL's  Yes     Intervention  Provide education, individualized exercise plan and daily activity instruction to help decrease symptoms of SOB with activities of daily living.    Expected Outcomes  Short Term: Achieves a reduction of symptoms when performing activities of daily living.       Core Components/Risk Factors/Patient Goals Review:  Goals and Risk Factor Review    Row Name 03/26/17 2136 04/17/17 1608 05/08/17 1726 06/01/17 0829 06/26/17 1206     Core Components/Risk Factors/Patient Goals Review   Personal Goals Review  Improve shortness of breath with ADL's  Improve shortness of breath with ADL's  Improve shortness of breath with ADL's  Improve shortness of breath with ADL's  Improve shortness of breath with ADL's   Review  patient has only attended 2 exercise sessions since admission. Too early to evaluate progress towards rehab goals. should see progression over the next 30 days  patient is working hard in pulmonary rehab. he is tolerating workload increases and verbalizes and improvement in his shortness of breath. his bloodsugars are an issue most days and we are trying to find a good combination of the right amount of medications and healthy breakfast/snack to keep his bloodsugars up during exercise. He is working with our diabetes educator on this. He continues to go to dialysis M-W-F which limits his ability to exercise at home on those days.   Patient continues to work hard in pulmonary rehab. He attends 90% of the scheduled sessions. He states he is beginning to see improvement in his physical stamina and strength and his exertional shortness of breath with ADLs and he feels this is helping him tolerate dialysis better.  Patient continues to work hard in pulmonary rehab. He attends 90% of the scheduled sessions. He states he is beginning to see improvement in his physical stamina and strength and his exertional shortness of breath with ADLs and he feels this is helping him tolerate dialysis better.   Patient continues to progress in pulmonary rehab. He is physically limited by lower leg cramps with riding the bike and walking. This is the same cramping that occurs during dialysis. He is discouraged that his progression is not limited by his SOB, but by these cramps. He states his physician is aware. Kamarii my have hit a plateau. Will continue to follow    Expected Outcomes  see admission goals  see admission goals  see admission goals  see admission goals  see admission goals      Core Components/Risk Factors/Patient Goals at Discharge (Final Review):  Goals and Risk Factor Review - 06/26/17 1206      Core Components/Risk Factors/Patient Goals Review   Personal Goals Review  Improve shortness of breath with ADL's    Review  Patient continues to progress in pulmonary rehab. He is physically  limited by lower leg cramps with riding the bike and walking. This is the same cramping that occurs during dialysis. He is discouraged that his progression is not limited by his SOB, but by these cramps. He states his physician is aware. Jb my have hit a plateau. Will continue to follow     Expected Outcomes  see admission goals       ITP Comments:   Comments: Patient has attended 21 pulmonary rehab sessions since admission

## 2017-06-27 NOTE — Progress Notes (Signed)
Daily Session Note  Patient Details  Name: [name] MRN: [id number] Date of Birth: 17-Aug-1950 Referring Provider:     Pulmonary Rehab Walk Test from 03/14/2017 in [place]  Referring Provider  Dr. Nelda Marseille      Encounter Date: 06/27/2017  Check In: Session Check In - 06/27/17 1051      Check-In   Location  [facility]    Staff Present  Trish Fountain, RN, BSN;Muhamed Luecke Ysidro Evert, RN;Molly diVincenzo, MS, ACSM RCEP, Exercise Physiologist;Joan Leonia Reeves, RN, BSN    Supervising physician immediately available to respond to emergencies  [facility] immediately available    Physician(s)  Dr. Zigmund Daniel    Medication changes reported      No    Fall or balance concerns reported     No    Tobacco Cessation  No Change    Warm-up and Cool-down  Performed as group-led instruction    Resistance Training Performed  Yes    VAD Patient?  No      Pain Assessment   Currently in Pain?  No/denies    Multiple Pain Sites  No       Capillary Blood Glucose: No results found for this or any previous visit (from the past 24 hour(s)). POCT Glucose - 06/27/17 1243      POCT Blood Glucose   Pre-Exercise  168 mg/dL    Post-Exercise  151 mg/dL      Exercise Prescription Changes - 06/27/17 1200      Response to Exercise   Blood Pressure (Admit)  104/60    Blood Pressure (Exercise)  110/64    Blood Pressure (Exit)  110/60    Heart Rate (Admit)  80 bpm    Heart Rate (Exercise)  89 bpm    Heart Rate (Exit)  89 bpm    Oxygen Saturation (Admit)  96 %    Oxygen Saturation (Exercise)  95 %    Oxygen Saturation (Exit)  98 %    Rating of Perceived Exertion (Exercise)  11    Perceived Dyspnea (Exercise)  1    Duration  Progress to 45 minutes of aerobic exercise without signs/symptoms of physical distress    Intensity  THRR unchanged      Progression   Progression  Continue to progress workloads to maintain intensity without signs/symptoms of  physical distress.      Resistance Training   Training Prescription  Yes    Weight  orange bands    Reps  10-15    Time  10 Minutes      NuStep   Level  6    Minutes  17    METs  2.5      Track   Laps  10    Minutes  17       Social History   Tobacco Use  Smoking Status Former Smoker  . Packs/day: 0.30  . Years: 20.00  . Pack years: 6.00  . Types: Cigarettes  . Last attempt to quit: 06/06/2002  . Years since quitting: 15.0  Smokeless Tobacco Never Used    Goals Met:  Exercise tolerated well No report of cardiac concerns or symptoms Strength training completed today  Goals Unmet:  Not Applicable  Comments: Service time is from 1030 to 1205    Dr. Rush Farmer is Medical Director for Pulmonary Rehab at [facility].

## 2017-06-28 DIAGNOSIS — N186 End stage renal disease: Secondary | ICD-10-CM | POA: Diagnosis not present

## 2017-06-28 DIAGNOSIS — E876 Hypokalemia: Secondary | ICD-10-CM | POA: Diagnosis not present

## 2017-06-28 DIAGNOSIS — D631 Anemia in chronic kidney disease: Secondary | ICD-10-CM | POA: Diagnosis not present

## 2017-06-28 DIAGNOSIS — N2581 Secondary hyperparathyroidism of renal origin: Secondary | ICD-10-CM | POA: Diagnosis not present

## 2017-06-28 DIAGNOSIS — E1129 Type 2 diabetes mellitus with other diabetic kidney complication: Secondary | ICD-10-CM | POA: Diagnosis not present

## 2017-06-28 DIAGNOSIS — T8579XA Infection and inflammatory reaction due to other internal prosthetic devices, implants and grafts, initial encounter: Secondary | ICD-10-CM | POA: Diagnosis not present

## 2017-06-29 ENCOUNTER — Encounter (HOSPITAL_COMMUNITY)
Admission: RE | Admit: 2017-06-29 | Discharge: 2017-06-29 | Disposition: A | Discharge status: Home / Self Care | Payer: Medicare Other | Source: Ambulatory Visit | Attending: Pulmonary Disease | Admitting: Pulmonary Disease

## 2017-06-29 VITALS — Wt 142.0 lb

## 2017-06-29 DIAGNOSIS — Z7982 Long term (current) use of aspirin: Secondary | ICD-10-CM | POA: Diagnosis not present

## 2017-06-29 DIAGNOSIS — Z79899 Other long term (current) drug therapy: Secondary | ICD-10-CM | POA: Diagnosis not present

## 2017-06-29 DIAGNOSIS — Z794 Long term (current) use of insulin: Secondary | ICD-10-CM | POA: Diagnosis not present

## 2017-06-29 DIAGNOSIS — Z942 Lung transplant status: Secondary | ICD-10-CM

## 2017-06-29 DIAGNOSIS — K219 Gastro-esophageal reflux disease without esophagitis: Secondary | ICD-10-CM | POA: Diagnosis not present

## 2017-06-29 DIAGNOSIS — Z7952 Long term (current) use of systemic steroids: Secondary | ICD-10-CM | POA: Diagnosis not present

## 2017-06-29 NOTE — Progress Notes (Signed)
Daily Session Note  Patient Details  Name: MAXEY RANSOM MRN: [id number] Date of Birth: 01/17/1951 Referring Provider:     Pulmonary Rehab Walk Test from 03/14/2017 in [place]  Referring Provider  Dr. Nelda Marseille      Encounter Date: 06/29/2017  Check In: Session Check In - 06/29/17 1030      Check-In   Location  [facility]    Staff Present  Trish Fountain, RN, BSN;Molly diVincenzo, MS, ACSM RCEP, Exercise Physiologist;Joan Leonia Reeves, RN, BSN    Supervising physician immediately available to respond to emergencies  [facility] immediately available    Physician(s)  Dr. Bonner Puna    Medication changes reported      No    Fall or balance concerns reported     No    Tobacco Cessation  No Change    Warm-up and Cool-down  Performed as group-led instruction    Resistance Training Performed  Yes    VAD Patient?  No      Pain Assessment   Currently in Pain?  No/denies    Multiple Pain Sites  No       Capillary Blood Glucose: No results found for this or any previous visit (from the past 24 hour(s)). POCT Glucose - 06/29/17 1228      POCT Blood Glucose   Pre-Exercise  143 mg/dL      Exercise Prescription Changes - 06/29/17 1226      Response to Exercise   Blood Pressure (Admit)  110/54    Blood Pressure (Exercise)  140/60    Blood Pressure (Exit)  94/54    Heart Rate (Admit)  74 bpm    Heart Rate (Exercise)  84 bpm    Heart Rate (Exit)  84 bpm    Oxygen Saturation (Admit)  98 %    Oxygen Saturation (Exercise)  83 %    Oxygen Saturation (Exit)  98 %    Rating of Perceived Exertion (Exercise)  11    Perceived Dyspnea (Exercise)  0    Duration  Progress to 45 minutes of aerobic exercise without signs/symptoms of physical distress    Intensity  THRR unchanged      Progression   Progression  Continue to progress workloads to maintain intensity without signs/symptoms of physical distress.      Resistance Training   Training Prescription  Yes    Weight  orange bands    Reps  10-15    Time  10 Minutes      Bike   Level  0.8    Minutes  17      NuStep   Level  6    Minutes  17    METs  2.7       Social History   Tobacco Use  Smoking Status Former Smoker  . Packs/day: 0.30  . Years: 20.00  . Pack years: 6.00  . Types: Cigarettes  . Last attempt to quit: 06/06/2002  . Years since quitting: 15.0  Smokeless Tobacco Never Used    Goals Met:  Independence with exercise equipment Improved SOB with ADL's Using PLB without cueing & demonstrates good technique Exercise tolerated well No report of cardiac concerns or symptoms Strength training completed today  Goals Unmet:  Not Applicable  Comments: Service time is from 1030 to 1210   Dr. Rush Farmer is Medical Director for Pulmonary Rehab at [facility].

## 2017-06-30 DIAGNOSIS — E876 Hypokalemia: Secondary | ICD-10-CM | POA: Diagnosis not present

## 2017-06-30 DIAGNOSIS — T8579XA Infection and inflammatory reaction due to other internal prosthetic devices, implants and grafts, initial encounter: Secondary | ICD-10-CM | POA: Diagnosis not present

## 2017-06-30 DIAGNOSIS — E1129 Type 2 diabetes mellitus with other diabetic kidney complication: Secondary | ICD-10-CM | POA: Diagnosis not present

## 2017-06-30 DIAGNOSIS — N186 End stage renal disease: Secondary | ICD-10-CM | POA: Diagnosis not present

## 2017-06-30 DIAGNOSIS — D631 Anemia in chronic kidney disease: Secondary | ICD-10-CM | POA: Diagnosis not present

## 2017-06-30 DIAGNOSIS — N2581 Secondary hyperparathyroidism of renal origin: Secondary | ICD-10-CM | POA: Diagnosis not present

## 2017-07-03 DIAGNOSIS — N2581 Secondary hyperparathyroidism of renal origin: Secondary | ICD-10-CM | POA: Diagnosis not present

## 2017-07-03 DIAGNOSIS — D631 Anemia in chronic kidney disease: Secondary | ICD-10-CM | POA: Diagnosis not present

## 2017-07-03 DIAGNOSIS — N186 End stage renal disease: Secondary | ICD-10-CM | POA: Diagnosis not present

## 2017-07-03 DIAGNOSIS — T8579XA Infection and inflammatory reaction due to other internal prosthetic devices, implants and grafts, initial encounter: Secondary | ICD-10-CM | POA: Diagnosis not present

## 2017-07-03 DIAGNOSIS — E876 Hypokalemia: Secondary | ICD-10-CM | POA: Diagnosis not present

## 2017-07-03 DIAGNOSIS — E1129 Type 2 diabetes mellitus with other diabetic kidney complication: Secondary | ICD-10-CM | POA: Diagnosis not present

## 2017-07-04 ENCOUNTER — Encounter (HOSPITAL_COMMUNITY)
Admission: RE | Admit: 2017-07-04 | Discharge: 2017-07-04 | Disposition: A | Discharge status: Home / Self Care | Payer: Medicare Other | Source: Ambulatory Visit | Attending: Pulmonary Disease | Admitting: Pulmonary Disease

## 2017-07-04 VITALS — Wt 143.5 lb

## 2017-07-04 DIAGNOSIS — Z79899 Other long term (current) drug therapy: Secondary | ICD-10-CM | POA: Diagnosis not present

## 2017-07-04 DIAGNOSIS — Z942 Lung transplant status: Secondary | ICD-10-CM | POA: Diagnosis not present

## 2017-07-04 DIAGNOSIS — K219 Gastro-esophageal reflux disease without esophagitis: Secondary | ICD-10-CM | POA: Diagnosis not present

## 2017-07-04 DIAGNOSIS — Z7952 Long term (current) use of systemic steroids: Secondary | ICD-10-CM | POA: Diagnosis not present

## 2017-07-04 DIAGNOSIS — Z7982 Long term (current) use of aspirin: Secondary | ICD-10-CM | POA: Diagnosis not present

## 2017-07-04 DIAGNOSIS — Z794 Long term (current) use of insulin: Secondary | ICD-10-CM | POA: Diagnosis not present

## 2017-07-04 NOTE — Progress Notes (Signed)
Daily Session Note  Patient Details  Name: [name] MRN: [id number] Date of Birth: [birth year] Referring Provider:     Pulmonary Rehab Walk Test from 03/14/2017 in [place]  Referring Provider  Dr. Nelda Marseille      Encounter Date: 07/04/2017  Check In: Session Check In - 07/04/17 1218      Check-In   Location  [facility]    Staff Present  Trish Fountain, RN, BSN;Molly diVincenzo, MS, ACSM RCEP, Exercise Physiologist;Joan Leonia Reeves, RN, Roque Cash, RN    Supervising physician immediately available to respond to emergencies  [facility] immediately available    Physician(s)  Dr. Bonner Puna    Medication changes reported      No    Fall or balance concerns reported     No    Tobacco Cessation  No Change    Warm-up and Cool-down  Performed as group-led instruction    Resistance Training Performed  Yes    VAD Patient?  No      Pain Assessment   Currently in Pain?  No/denies    Multiple Pain Sites  No       Capillary Blood Glucose: No results found for this or any previous visit (from the past 24 hour(s)). POCT Glucose - 07/04/17 1223      POCT Blood Glucose   Pre-Exercise  124 mg/dL    Post-Exercise  136 mg/dL      Exercise Prescription Changes - 07/04/17 1221      Response to Exercise   Blood Pressure (Admit)  96/54    Blood Pressure (Exercise)  126/56    Blood Pressure (Exit)  96/50    Heart Rate (Admit)  77 bpm    Heart Rate (Exercise)  84 bpm    Heart Rate (Exit)  84 bpm    Oxygen Saturation (Admit)  97 %    Oxygen Saturation (Exercise)  92 %    Oxygen Saturation (Exit)  96 %    Rating of Perceived Exertion (Exercise)  11    Perceived Dyspnea (Exercise)  0    Duration  Progress to 45 minutes of aerobic exercise without signs/symptoms of physical distress    Intensity  THRR unchanged      Progression   Progression  Continue to progress workloads to maintain intensity without signs/symptoms of physical  distress.      Resistance Training   Training Prescription  Yes    Weight  orange bands    Reps  10-15    Time  10 Minutes      Bike   Level  0.8    Minutes  17      NuStep   Level  6    Minutes  17    METs  2.2      Track   Laps  9    Minutes  17       Social History   Tobacco Use  Smoking Status Former Smoker  . Packs/day: 0.30  . Years: 20.00  . Pack years: 6.00  . Types: Cigarettes  . Last attempt to quit: 06/06/2002  . Years since quitting: 15.0  Smokeless Tobacco Never Used    Goals Met:  Improved SOB with ADL's Using PLB without cueing & demonstrates good technique Exercise tolerated well No report of cardiac concerns or symptoms Strength training completed today  Goals Unmet:  Not Applicable  Comments: Service time is from 1030 to 1200   Dr.  Rush Farmer is Medical Director for Pulmonary Rehab at [facility].

## 2017-07-05 DIAGNOSIS — N2581 Secondary hyperparathyroidism of renal origin: Secondary | ICD-10-CM | POA: Diagnosis not present

## 2017-07-05 DIAGNOSIS — E1129 Type 2 diabetes mellitus with other diabetic kidney complication: Secondary | ICD-10-CM | POA: Diagnosis not present

## 2017-07-05 DIAGNOSIS — E876 Hypokalemia: Secondary | ICD-10-CM | POA: Diagnosis not present

## 2017-07-05 DIAGNOSIS — T8579XA Infection and inflammatory reaction due to other internal prosthetic devices, implants and grafts, initial encounter: Secondary | ICD-10-CM | POA: Diagnosis not present

## 2017-07-05 DIAGNOSIS — N186 End stage renal disease: Secondary | ICD-10-CM | POA: Diagnosis not present

## 2017-07-05 DIAGNOSIS — D631 Anemia in chronic kidney disease: Secondary | ICD-10-CM | POA: Diagnosis not present

## 2017-07-06 ENCOUNTER — Encounter (HOSPITAL_COMMUNITY)
Admission: RE | Admit: 2017-07-06 | Discharge: 2017-07-06 | Disposition: A | Discharge status: Home / Self Care | Payer: Medicare Other | Source: Ambulatory Visit | Attending: Pulmonary Disease | Admitting: Pulmonary Disease

## 2017-07-06 DIAGNOSIS — Z79899 Other long term (current) drug therapy: Secondary | ICD-10-CM | POA: Diagnosis not present

## 2017-07-06 DIAGNOSIS — N186 End stage renal disease: Secondary | ICD-10-CM | POA: Diagnosis not present

## 2017-07-06 DIAGNOSIS — T86819 Unspecified complication of lung transplant: Secondary | ICD-10-CM | POA: Diagnosis not present

## 2017-07-06 DIAGNOSIS — K219 Gastro-esophageal reflux disease without esophagitis: Secondary | ICD-10-CM | POA: Diagnosis not present

## 2017-07-06 DIAGNOSIS — Z942 Lung transplant status: Secondary | ICD-10-CM | POA: Diagnosis not present

## 2017-07-06 DIAGNOSIS — Z7952 Long term (current) use of systemic steroids: Secondary | ICD-10-CM | POA: Diagnosis not present

## 2017-07-06 DIAGNOSIS — Z992 Dependence on renal dialysis: Secondary | ICD-10-CM | POA: Diagnosis not present

## 2017-07-06 DIAGNOSIS — Z7982 Long term (current) use of aspirin: Secondary | ICD-10-CM | POA: Diagnosis not present

## 2017-07-06 DIAGNOSIS — Z794 Long term (current) use of insulin: Secondary | ICD-10-CM | POA: Diagnosis not present

## 2017-07-07 DIAGNOSIS — E876 Hypokalemia: Secondary | ICD-10-CM | POA: Diagnosis not present

## 2017-07-07 DIAGNOSIS — N2581 Secondary hyperparathyroidism of renal origin: Secondary | ICD-10-CM | POA: Diagnosis not present

## 2017-07-07 DIAGNOSIS — Z992 Dependence on renal dialysis: Secondary | ICD-10-CM | POA: Diagnosis not present

## 2017-07-07 DIAGNOSIS — E1129 Type 2 diabetes mellitus with other diabetic kidney complication: Secondary | ICD-10-CM | POA: Diagnosis not present

## 2017-07-07 DIAGNOSIS — N186 End stage renal disease: Secondary | ICD-10-CM | POA: Diagnosis not present

## 2017-07-07 DIAGNOSIS — T86819 Unspecified complication of lung transplant: Secondary | ICD-10-CM | POA: Diagnosis not present

## 2017-07-07 DIAGNOSIS — T8579XA Infection and inflammatory reaction due to other internal prosthetic devices, implants and grafts, initial encounter: Secondary | ICD-10-CM | POA: Diagnosis not present

## 2017-07-10 DIAGNOSIS — T8579XA Infection and inflammatory reaction due to other internal prosthetic devices, implants and grafts, initial encounter: Secondary | ICD-10-CM | POA: Diagnosis not present

## 2017-07-10 DIAGNOSIS — N2581 Secondary hyperparathyroidism of renal origin: Secondary | ICD-10-CM | POA: Diagnosis not present

## 2017-07-10 DIAGNOSIS — E1129 Type 2 diabetes mellitus with other diabetic kidney complication: Secondary | ICD-10-CM | POA: Diagnosis not present

## 2017-07-10 DIAGNOSIS — E876 Hypokalemia: Secondary | ICD-10-CM | POA: Diagnosis not present

## 2017-07-10 DIAGNOSIS — N186 End stage renal disease: Secondary | ICD-10-CM | POA: Diagnosis not present

## 2017-07-12 DIAGNOSIS — N2581 Secondary hyperparathyroidism of renal origin: Secondary | ICD-10-CM | POA: Diagnosis not present

## 2017-07-12 DIAGNOSIS — T8579XA Infection and inflammatory reaction due to other internal prosthetic devices, implants and grafts, initial encounter: Secondary | ICD-10-CM | POA: Diagnosis not present

## 2017-07-12 DIAGNOSIS — E876 Hypokalemia: Secondary | ICD-10-CM | POA: Diagnosis not present

## 2017-07-12 DIAGNOSIS — E1129 Type 2 diabetes mellitus with other diabetic kidney complication: Secondary | ICD-10-CM | POA: Diagnosis not present

## 2017-07-12 DIAGNOSIS — N186 End stage renal disease: Secondary | ICD-10-CM | POA: Diagnosis not present

## 2017-07-13 DIAGNOSIS — R2689 Other abnormalities of gait and mobility: Secondary | ICD-10-CM | POA: Diagnosis not present

## 2017-07-13 DIAGNOSIS — Z0000 Encounter for general adult medical examination without abnormal findings: Secondary | ICD-10-CM | POA: Diagnosis not present

## 2017-07-13 DIAGNOSIS — I700 Atherosclerosis of aorta: Secondary | ICD-10-CM | POA: Diagnosis not present

## 2017-07-13 DIAGNOSIS — R252 Cramp and spasm: Secondary | ICD-10-CM | POA: Diagnosis not present

## 2017-07-13 DIAGNOSIS — N186 End stage renal disease: Secondary | ICD-10-CM | POA: Diagnosis not present

## 2017-07-13 DIAGNOSIS — Z942 Lung transplant status: Secondary | ICD-10-CM | POA: Diagnosis not present

## 2017-07-13 DIAGNOSIS — I5032 Chronic diastolic (congestive) heart failure: Secondary | ICD-10-CM | POA: Diagnosis not present

## 2017-07-13 DIAGNOSIS — G4733 Obstructive sleep apnea (adult) (pediatric): Secondary | ICD-10-CM | POA: Diagnosis not present

## 2017-07-13 DIAGNOSIS — I10 Essential (primary) hypertension: Secondary | ICD-10-CM | POA: Diagnosis not present

## 2017-07-13 DIAGNOSIS — Z125 Encounter for screening for malignant neoplasm of prostate: Secondary | ICD-10-CM | POA: Diagnosis not present

## 2017-07-13 DIAGNOSIS — Z23 Encounter for immunization: Secondary | ICD-10-CM | POA: Diagnosis not present

## 2017-07-13 DIAGNOSIS — F329 Major depressive disorder, single episode, unspecified: Secondary | ICD-10-CM | POA: Diagnosis not present

## 2017-07-13 DIAGNOSIS — Z1389 Encounter for screening for other disorder: Secondary | ICD-10-CM | POA: Diagnosis not present

## 2017-07-13 DIAGNOSIS — E1122 Type 2 diabetes mellitus with diabetic chronic kidney disease: Secondary | ICD-10-CM | POA: Diagnosis not present

## 2017-07-13 DIAGNOSIS — I739 Peripheral vascular disease, unspecified: Secondary | ICD-10-CM | POA: Diagnosis not present

## 2017-07-13 DIAGNOSIS — Z992 Dependence on renal dialysis: Secondary | ICD-10-CM | POA: Diagnosis not present

## 2017-07-13 DIAGNOSIS — J849 Interstitial pulmonary disease, unspecified: Secondary | ICD-10-CM | POA: Diagnosis not present

## 2017-07-14 DIAGNOSIS — E1129 Type 2 diabetes mellitus with other diabetic kidney complication: Secondary | ICD-10-CM | POA: Diagnosis not present

## 2017-07-14 DIAGNOSIS — T8579XA Infection and inflammatory reaction due to other internal prosthetic devices, implants and grafts, initial encounter: Secondary | ICD-10-CM | POA: Diagnosis not present

## 2017-07-14 DIAGNOSIS — E876 Hypokalemia: Secondary | ICD-10-CM | POA: Diagnosis not present

## 2017-07-14 DIAGNOSIS — N2581 Secondary hyperparathyroidism of renal origin: Secondary | ICD-10-CM | POA: Diagnosis not present

## 2017-07-14 DIAGNOSIS — N186 End stage renal disease: Secondary | ICD-10-CM | POA: Diagnosis not present

## 2017-07-17 DIAGNOSIS — T8579XA Infection and inflammatory reaction due to other internal prosthetic devices, implants and grafts, initial encounter: Secondary | ICD-10-CM | POA: Diagnosis not present

## 2017-07-17 DIAGNOSIS — N186 End stage renal disease: Secondary | ICD-10-CM | POA: Diagnosis not present

## 2017-07-17 DIAGNOSIS — H11829 Conjunctivochalasis, unspecified eye: Secondary | ICD-10-CM | POA: Diagnosis not present

## 2017-07-17 DIAGNOSIS — H40043 Steroid responder, bilateral: Secondary | ICD-10-CM | POA: Diagnosis not present

## 2017-07-17 DIAGNOSIS — E876 Hypokalemia: Secondary | ICD-10-CM | POA: Diagnosis not present

## 2017-07-17 DIAGNOSIS — E1129 Type 2 diabetes mellitus with other diabetic kidney complication: Secondary | ICD-10-CM | POA: Diagnosis not present

## 2017-07-17 DIAGNOSIS — N2581 Secondary hyperparathyroidism of renal origin: Secondary | ICD-10-CM | POA: Diagnosis not present

## 2017-07-17 DIAGNOSIS — H40051 Ocular hypertension, right eye: Secondary | ICD-10-CM | POA: Diagnosis not present

## 2017-07-19 ENCOUNTER — Encounter (HOSPITAL_COMMUNITY): Payer: Self-pay | Admitting: *Deleted

## 2017-07-19 DIAGNOSIS — T8579XA Infection and inflammatory reaction due to other internal prosthetic devices, implants and grafts, initial encounter: Secondary | ICD-10-CM | POA: Diagnosis not present

## 2017-07-19 DIAGNOSIS — N186 End stage renal disease: Secondary | ICD-10-CM | POA: Diagnosis not present

## 2017-07-19 DIAGNOSIS — E876 Hypokalemia: Secondary | ICD-10-CM | POA: Diagnosis not present

## 2017-07-19 DIAGNOSIS — N2581 Secondary hyperparathyroidism of renal origin: Secondary | ICD-10-CM | POA: Diagnosis not present

## 2017-07-19 DIAGNOSIS — E1129 Type 2 diabetes mellitus with other diabetic kidney complication: Secondary | ICD-10-CM | POA: Diagnosis not present

## 2017-07-20 DIAGNOSIS — R195 Other fecal abnormalities: Secondary | ICD-10-CM | POA: Diagnosis not present

## 2017-07-20 DIAGNOSIS — Z79899 Other long term (current) drug therapy: Secondary | ICD-10-CM | POA: Diagnosis not present

## 2017-07-20 DIAGNOSIS — Z942 Lung transplant status: Secondary | ICD-10-CM | POA: Diagnosis not present

## 2017-07-20 DIAGNOSIS — R142 Eructation: Secondary | ICD-10-CM | POA: Diagnosis not present

## 2017-07-21 DIAGNOSIS — T8579XA Infection and inflammatory reaction due to other internal prosthetic devices, implants and grafts, initial encounter: Secondary | ICD-10-CM | POA: Diagnosis not present

## 2017-07-21 DIAGNOSIS — E1129 Type 2 diabetes mellitus with other diabetic kidney complication: Secondary | ICD-10-CM | POA: Diagnosis not present

## 2017-07-21 DIAGNOSIS — N2581 Secondary hyperparathyroidism of renal origin: Secondary | ICD-10-CM | POA: Diagnosis not present

## 2017-07-21 DIAGNOSIS — E876 Hypokalemia: Secondary | ICD-10-CM | POA: Diagnosis not present

## 2017-07-21 DIAGNOSIS — N186 End stage renal disease: Secondary | ICD-10-CM | POA: Diagnosis not present

## 2017-07-24 DIAGNOSIS — E1129 Type 2 diabetes mellitus with other diabetic kidney complication: Secondary | ICD-10-CM | POA: Diagnosis not present

## 2017-07-24 DIAGNOSIS — N2581 Secondary hyperparathyroidism of renal origin: Secondary | ICD-10-CM | POA: Diagnosis not present

## 2017-07-24 DIAGNOSIS — N186 End stage renal disease: Secondary | ICD-10-CM | POA: Diagnosis not present

## 2017-07-24 DIAGNOSIS — E876 Hypokalemia: Secondary | ICD-10-CM | POA: Diagnosis not present

## 2017-07-24 DIAGNOSIS — T8579XA Infection and inflammatory reaction due to other internal prosthetic devices, implants and grafts, initial encounter: Secondary | ICD-10-CM | POA: Diagnosis not present

## 2017-07-26 DIAGNOSIS — N186 End stage renal disease: Secondary | ICD-10-CM | POA: Diagnosis not present

## 2017-07-26 DIAGNOSIS — E876 Hypokalemia: Secondary | ICD-10-CM | POA: Diagnosis not present

## 2017-07-26 DIAGNOSIS — N2581 Secondary hyperparathyroidism of renal origin: Secondary | ICD-10-CM | POA: Diagnosis not present

## 2017-07-26 DIAGNOSIS — T8579XA Infection and inflammatory reaction due to other internal prosthetic devices, implants and grafts, initial encounter: Secondary | ICD-10-CM | POA: Diagnosis not present

## 2017-07-26 DIAGNOSIS — E1129 Type 2 diabetes mellitus with other diabetic kidney complication: Secondary | ICD-10-CM | POA: Diagnosis not present

## 2017-07-26 DIAGNOSIS — G4733 Obstructive sleep apnea (adult) (pediatric): Secondary | ICD-10-CM | POA: Diagnosis not present

## 2017-07-28 DIAGNOSIS — T8579XA Infection and inflammatory reaction due to other internal prosthetic devices, implants and grafts, initial encounter: Secondary | ICD-10-CM | POA: Diagnosis not present

## 2017-07-28 DIAGNOSIS — E1129 Type 2 diabetes mellitus with other diabetic kidney complication: Secondary | ICD-10-CM | POA: Diagnosis not present

## 2017-07-28 DIAGNOSIS — N2581 Secondary hyperparathyroidism of renal origin: Secondary | ICD-10-CM | POA: Diagnosis not present

## 2017-07-28 DIAGNOSIS — E876 Hypokalemia: Secondary | ICD-10-CM | POA: Diagnosis not present

## 2017-07-28 DIAGNOSIS — N186 End stage renal disease: Secondary | ICD-10-CM | POA: Diagnosis not present

## 2017-07-31 DIAGNOSIS — N186 End stage renal disease: Secondary | ICD-10-CM | POA: Diagnosis not present

## 2017-07-31 DIAGNOSIS — E876 Hypokalemia: Secondary | ICD-10-CM | POA: Diagnosis not present

## 2017-07-31 DIAGNOSIS — E1129 Type 2 diabetes mellitus with other diabetic kidney complication: Secondary | ICD-10-CM | POA: Diagnosis not present

## 2017-07-31 DIAGNOSIS — N2581 Secondary hyperparathyroidism of renal origin: Secondary | ICD-10-CM | POA: Diagnosis not present

## 2017-07-31 DIAGNOSIS — T8579XA Infection and inflammatory reaction due to other internal prosthetic devices, implants and grafts, initial encounter: Secondary | ICD-10-CM | POA: Diagnosis not present

## 2017-08-02 DIAGNOSIS — E1129 Type 2 diabetes mellitus with other diabetic kidney complication: Secondary | ICD-10-CM | POA: Diagnosis not present

## 2017-08-02 DIAGNOSIS — T8579XA Infection and inflammatory reaction due to other internal prosthetic devices, implants and grafts, initial encounter: Secondary | ICD-10-CM | POA: Diagnosis not present

## 2017-08-02 DIAGNOSIS — N2581 Secondary hyperparathyroidism of renal origin: Secondary | ICD-10-CM | POA: Diagnosis not present

## 2017-08-02 DIAGNOSIS — N186 End stage renal disease: Secondary | ICD-10-CM | POA: Diagnosis not present

## 2017-08-02 DIAGNOSIS — E876 Hypokalemia: Secondary | ICD-10-CM | POA: Diagnosis not present

## 2017-08-02 NOTE — Addendum Note (Signed)
Encounter addended by: Jewel Baize, RD on: 08/02/2017 10:32 AM  Actions taken: Flowsheet data copied forward, Visit Navigator Flowsheet section accepted

## 2017-08-03 ENCOUNTER — Encounter (HOSPITAL_COMMUNITY): Payer: Self-pay

## 2017-08-03 DIAGNOSIS — Z942 Lung transplant status: Secondary | ICD-10-CM

## 2017-08-03 NOTE — Progress Notes (Signed)
Discharge Progress Report  Patient Details  Name: [name] MRN: [id number] Date of Birth: 04/09/1951 Referring Provider:     Pulmonary Rehab Walk Test from 03/14/2017 in [place]  Referring Provider  Dr. Nelda Marseille       Number of Visits: 25  Reason for Discharge:  Patient reached a stable level of exercise. Patient independent in their exercise. Patient has met program and personal goals.  Smoking History:  Social History   Tobacco Use  Smoking Status Former Smoker  . Packs/day: 0.30  . Years: 20.00  . Pack years: 6.00  . Types: Cigarettes  . Last attempt to quit: 06/06/2002  . Years since quitting: 15.1  Smokeless Tobacco Never Used    Diagnosis:  Status post lung transplantation [facility])  ADL UCSD: Pulmonary Assessment Scores    Row Name 07/10/17 0739 07/19/17 1054       ADL UCSD   ADL Phase  Exit  Exit    SOB Score total  -  30      CAT Score   CAT Score  -  11 Exit      mMRC Score   mMRC Score  0  -       Initial Exercise Prescription:   Discharge Exercise Prescription (Final Exercise Prescription Changes): Exercise Prescription Changes - 07/04/17 1221      Response to Exercise   Blood Pressure (Admit)  96/54    Blood Pressure (Exercise)  126/56    Blood Pressure (Exit)  96/50    Heart Rate (Admit)  77 bpm    Heart Rate (Exercise)  84 bpm    Heart Rate (Exit)  84 bpm    Oxygen Saturation (Admit)  97 %    Oxygen Saturation (Exercise)  92 %    Oxygen Saturation (Exit)  96 %    Rating of Perceived Exertion (Exercise)  11    Perceived Dyspnea (Exercise)  0    Duration  Progress to 45 minutes of aerobic exercise without signs/symptoms of physical distress    Intensity  THRR unchanged      Progression   Progression  Continue to progress workloads to maintain intensity without signs/symptoms of physical distress.      Resistance Training   Training Prescription  Yes    Weight  orange bands    Reps  10-15     Time  10 Minutes      Bike   Level  0.8    Minutes  17      NuStep   Level  6    Minutes  17    METs  2.2      Track   Laps  9    Minutes  17       Functional Capacity: 6 Minute Walk    Row Name 07/10/17 0736         6 Minute Walk   Phase  Discharge     Distance  1240 feet     Distance Feet Change  240 ft     Walk Time  6 minutes     # of Rest Breaks  0     MPH  2.34     METS  2.76     RPE  11     Perceived Dyspnea   1     Symptoms  Yes (comment)     Comments  10/10 calf pain      Resting HR  77 bpm  Resting BP  122/74     Resting Oxygen Saturation   98 %     Exercise Oxygen Saturation  during 6 min walk  93 %     Max Ex. HR  89 bpm     Max Ex. BP  180/84     2 Minute Post BP  162/83       Interval HR   1 Minute HR  79     2 Minute HR  81     3 Minute HR  83     4 Minute HR  85     5 Minute HR  86     6 Minute HR  89     2 Minute Post HR  86     Interval Heart Rate?  Yes       Interval Oxygen   Interval Oxygen?  Yes     Baseline Oxygen Saturation %  98 %     1 Minute Oxygen Saturation %  99 %     1 Minute Liters of Oxygen  0 L     2 Minute Oxygen Saturation %  97 %     2 Minute Liters of Oxygen  0 L     3 Minute Oxygen Saturation %  94 %     3 Minute Liters of Oxygen  0 L     4 Minute Oxygen Saturation %  93 %     4 Minute Liters of Oxygen  0 L     5 Minute Oxygen Saturation %  94 %     5 Minute Liters of Oxygen  0 L     6 Minute Oxygen Saturation %  94 %     6 Minute Liters of Oxygen  0 L     2 Minute Post Oxygen Saturation %  94 %     2 Minute Post Liters of Oxygen  0 L        Psychological, QOL, Others - Outcomes: PHQ 2/9: Depression screen [facility] 2/9 03/13/2017 08/21/2015 07/22/2014 05/09/2014 12/03/2012  Decreased Interest 0 0 0 0 0  Down, Depressed, Hopeless 0 0 0 0 0  PHQ - 2 Score 0 0 0 0 0    Quality of Life:   Personal Goals: Goals established at orientation with interventions provided to work toward goal.    Personal  Goals Discharge: Goals and Risk Factor Review    Row Name 06/26/17 1206 08/03/17 0839           Core Components/Risk Factors/Patient Goals Review   Personal Goals Review  Improve shortness of breath with ADL's  Improve shortness of breath with ADL's      Review  Patient continues to progress in pulmonary rehab. He is physically limited by lower leg cramps with riding the bike and walking. This is the same cramping that occurs during dialysis. He is discouraged that his progression is not limited by his SOB, but by these cramps. He states his physician is aware. Jermell my have hit a plateau. Will continue to follow   goal met at discharge      Expected Outcomes  see admission goals  see admission goals         Exercise Goals and Review:   Nutrition & Weight - Outcomes:  Post Biometrics - 07/10/17 0739       Post  Biometrics   Grip Strength  30 kg       Nutrition:   Nutrition  Discharge: Nutrition Assessments - 07/28/17 1038      Rate Your Plate Scores   Pre Score  55    Post Score  51       Education Questionnaire Score: Knowledge Questionnaire Score - 07/19/17 1054      Knowledge Questionnaire Score   Post Score  9/13       Goals reviewed with patient; copy given to patient.

## 2017-08-04 DIAGNOSIS — E1129 Type 2 diabetes mellitus with other diabetic kidney complication: Secondary | ICD-10-CM | POA: Diagnosis not present

## 2017-08-04 DIAGNOSIS — Z992 Dependence on renal dialysis: Secondary | ICD-10-CM | POA: Diagnosis not present

## 2017-08-04 DIAGNOSIS — E876 Hypokalemia: Secondary | ICD-10-CM | POA: Diagnosis not present

## 2017-08-04 DIAGNOSIS — N186 End stage renal disease: Secondary | ICD-10-CM | POA: Diagnosis not present

## 2017-08-04 DIAGNOSIS — T8579XA Infection and inflammatory reaction due to other internal prosthetic devices, implants and grafts, initial encounter: Secondary | ICD-10-CM | POA: Diagnosis not present

## 2017-08-04 DIAGNOSIS — T86819 Unspecified complication of lung transplant: Secondary | ICD-10-CM | POA: Diagnosis not present

## 2017-08-04 DIAGNOSIS — N2581 Secondary hyperparathyroidism of renal origin: Secondary | ICD-10-CM | POA: Diagnosis not present

## 2017-08-07 DIAGNOSIS — N186 End stage renal disease: Secondary | ICD-10-CM | POA: Diagnosis not present

## 2017-08-07 DIAGNOSIS — E876 Hypokalemia: Secondary | ICD-10-CM | POA: Diagnosis not present

## 2017-08-07 DIAGNOSIS — T8579XA Infection and inflammatory reaction due to other internal prosthetic devices, implants and grafts, initial encounter: Secondary | ICD-10-CM | POA: Diagnosis not present

## 2017-08-07 DIAGNOSIS — N2581 Secondary hyperparathyroidism of renal origin: Secondary | ICD-10-CM | POA: Diagnosis not present

## 2017-08-07 DIAGNOSIS — E1129 Type 2 diabetes mellitus with other diabetic kidney complication: Secondary | ICD-10-CM | POA: Diagnosis not present

## 2017-08-09 DIAGNOSIS — N186 End stage renal disease: Secondary | ICD-10-CM | POA: Diagnosis not present

## 2017-08-09 DIAGNOSIS — N2581 Secondary hyperparathyroidism of renal origin: Secondary | ICD-10-CM | POA: Diagnosis not present

## 2017-08-09 DIAGNOSIS — T8579XA Infection and inflammatory reaction due to other internal prosthetic devices, implants and grafts, initial encounter: Secondary | ICD-10-CM | POA: Diagnosis not present

## 2017-08-09 DIAGNOSIS — E1129 Type 2 diabetes mellitus with other diabetic kidney complication: Secondary | ICD-10-CM | POA: Diagnosis not present

## 2017-08-09 DIAGNOSIS — E876 Hypokalemia: Secondary | ICD-10-CM | POA: Diagnosis not present

## 2017-08-11 DIAGNOSIS — N186 End stage renal disease: Secondary | ICD-10-CM | POA: Diagnosis not present

## 2017-08-11 DIAGNOSIS — E1129 Type 2 diabetes mellitus with other diabetic kidney complication: Secondary | ICD-10-CM | POA: Diagnosis not present

## 2017-08-11 DIAGNOSIS — N2581 Secondary hyperparathyroidism of renal origin: Secondary | ICD-10-CM | POA: Diagnosis not present

## 2017-08-11 DIAGNOSIS — T8579XA Infection and inflammatory reaction due to other internal prosthetic devices, implants and grafts, initial encounter: Secondary | ICD-10-CM | POA: Diagnosis not present

## 2017-08-11 DIAGNOSIS — E876 Hypokalemia: Secondary | ICD-10-CM | POA: Diagnosis not present

## 2017-08-14 DIAGNOSIS — N2581 Secondary hyperparathyroidism of renal origin: Secondary | ICD-10-CM | POA: Diagnosis not present

## 2017-08-14 DIAGNOSIS — T8579XA Infection and inflammatory reaction due to other internal prosthetic devices, implants and grafts, initial encounter: Secondary | ICD-10-CM | POA: Diagnosis not present

## 2017-08-14 DIAGNOSIS — E1129 Type 2 diabetes mellitus with other diabetic kidney complication: Secondary | ICD-10-CM | POA: Diagnosis not present

## 2017-08-14 DIAGNOSIS — E876 Hypokalemia: Secondary | ICD-10-CM | POA: Diagnosis not present

## 2017-08-14 DIAGNOSIS — N186 End stage renal disease: Secondary | ICD-10-CM | POA: Diagnosis not present

## 2017-08-15 DIAGNOSIS — R066 Hiccough: Secondary | ICD-10-CM | POA: Diagnosis not present

## 2017-08-15 DIAGNOSIS — G4700 Insomnia, unspecified: Secondary | ICD-10-CM | POA: Diagnosis not present

## 2017-08-15 DIAGNOSIS — R760 Raised antibody titer: Secondary | ICD-10-CM | POA: Diagnosis not present

## 2017-08-15 DIAGNOSIS — D631 Anemia in chronic kidney disease: Secondary | ICD-10-CM | POA: Diagnosis not present

## 2017-08-15 DIAGNOSIS — Z8711 Personal history of peptic ulcer disease: Secondary | ICD-10-CM | POA: Diagnosis not present

## 2017-08-15 DIAGNOSIS — Z7952 Long term (current) use of systemic steroids: Secondary | ICD-10-CM | POA: Diagnosis not present

## 2017-08-15 DIAGNOSIS — Z942 Lung transplant status: Secondary | ICD-10-CM | POA: Diagnosis not present

## 2017-08-15 DIAGNOSIS — Z992 Dependence on renal dialysis: Secondary | ICD-10-CM | POA: Diagnosis not present

## 2017-08-15 DIAGNOSIS — M6281 Muscle weakness (generalized): Secondary | ICD-10-CM | POA: Diagnosis not present

## 2017-08-15 DIAGNOSIS — G629 Polyneuropathy, unspecified: Secondary | ICD-10-CM | POA: Diagnosis not present

## 2017-08-15 DIAGNOSIS — R251 Tremor, unspecified: Secondary | ICD-10-CM | POA: Diagnosis not present

## 2017-08-15 DIAGNOSIS — N185 Chronic kidney disease, stage 5: Secondary | ICD-10-CM | POA: Diagnosis not present

## 2017-08-15 DIAGNOSIS — N186 End stage renal disease: Secondary | ICD-10-CM | POA: Diagnosis not present

## 2017-08-15 DIAGNOSIS — J9811 Atelectasis: Secondary | ICD-10-CM | POA: Diagnosis not present

## 2017-08-15 DIAGNOSIS — E1122 Type 2 diabetes mellitus with diabetic chronic kidney disease: Secondary | ICD-10-CM | POA: Diagnosis not present

## 2017-08-15 DIAGNOSIS — R142 Eructation: Secondary | ICD-10-CM | POA: Diagnosis not present

## 2017-08-15 DIAGNOSIS — R6881 Early satiety: Secondary | ICD-10-CM | POA: Diagnosis not present

## 2017-08-15 DIAGNOSIS — I953 Hypotension of hemodialysis: Secondary | ICD-10-CM | POA: Diagnosis not present

## 2017-08-15 DIAGNOSIS — Z87891 Personal history of nicotine dependence: Secondary | ICD-10-CM | POA: Diagnosis not present

## 2017-08-15 DIAGNOSIS — D899 Disorder involving the immune mechanism, unspecified: Secondary | ICD-10-CM | POA: Diagnosis not present

## 2017-08-15 DIAGNOSIS — R0602 Shortness of breath: Secondary | ICD-10-CM | POA: Diagnosis not present

## 2017-08-15 DIAGNOSIS — Z4824 Encounter for aftercare following lung transplant: Secondary | ICD-10-CM | POA: Diagnosis not present

## 2017-08-15 DIAGNOSIS — Z79899 Other long term (current) drug therapy: Secondary | ICD-10-CM | POA: Diagnosis not present

## 2017-08-15 DIAGNOSIS — Z794 Long term (current) use of insulin: Secondary | ICD-10-CM | POA: Diagnosis not present

## 2017-08-16 DIAGNOSIS — T8579XA Infection and inflammatory reaction due to other internal prosthetic devices, implants and grafts, initial encounter: Secondary | ICD-10-CM | POA: Diagnosis not present

## 2017-08-16 DIAGNOSIS — E876 Hypokalemia: Secondary | ICD-10-CM | POA: Diagnosis not present

## 2017-08-16 DIAGNOSIS — N186 End stage renal disease: Secondary | ICD-10-CM | POA: Diagnosis not present

## 2017-08-16 DIAGNOSIS — E1129 Type 2 diabetes mellitus with other diabetic kidney complication: Secondary | ICD-10-CM | POA: Diagnosis not present

## 2017-08-16 DIAGNOSIS — N2581 Secondary hyperparathyroidism of renal origin: Secondary | ICD-10-CM | POA: Diagnosis not present

## 2017-08-18 DIAGNOSIS — N2581 Secondary hyperparathyroidism of renal origin: Secondary | ICD-10-CM | POA: Diagnosis not present

## 2017-08-18 DIAGNOSIS — T8579XA Infection and inflammatory reaction due to other internal prosthetic devices, implants and grafts, initial encounter: Secondary | ICD-10-CM | POA: Diagnosis not present

## 2017-08-18 DIAGNOSIS — E1129 Type 2 diabetes mellitus with other diabetic kidney complication: Secondary | ICD-10-CM | POA: Diagnosis not present

## 2017-08-18 DIAGNOSIS — N186 End stage renal disease: Secondary | ICD-10-CM | POA: Diagnosis not present

## 2017-08-18 DIAGNOSIS — E876 Hypokalemia: Secondary | ICD-10-CM | POA: Diagnosis not present

## 2017-08-21 DIAGNOSIS — E1129 Type 2 diabetes mellitus with other diabetic kidney complication: Secondary | ICD-10-CM | POA: Diagnosis not present

## 2017-08-21 DIAGNOSIS — E876 Hypokalemia: Secondary | ICD-10-CM | POA: Diagnosis not present

## 2017-08-21 DIAGNOSIS — N186 End stage renal disease: Secondary | ICD-10-CM | POA: Diagnosis not present

## 2017-08-21 DIAGNOSIS — N2581 Secondary hyperparathyroidism of renal origin: Secondary | ICD-10-CM | POA: Diagnosis not present

## 2017-08-21 DIAGNOSIS — T8579XA Infection and inflammatory reaction due to other internal prosthetic devices, implants and grafts, initial encounter: Secondary | ICD-10-CM | POA: Diagnosis not present

## 2017-08-23 DIAGNOSIS — N2581 Secondary hyperparathyroidism of renal origin: Secondary | ICD-10-CM | POA: Diagnosis not present

## 2017-08-23 DIAGNOSIS — E1129 Type 2 diabetes mellitus with other diabetic kidney complication: Secondary | ICD-10-CM | POA: Diagnosis not present

## 2017-08-23 DIAGNOSIS — T8579XA Infection and inflammatory reaction due to other internal prosthetic devices, implants and grafts, initial encounter: Secondary | ICD-10-CM | POA: Diagnosis not present

## 2017-08-23 DIAGNOSIS — N186 End stage renal disease: Secondary | ICD-10-CM | POA: Diagnosis not present

## 2017-08-23 DIAGNOSIS — E876 Hypokalemia: Secondary | ICD-10-CM | POA: Diagnosis not present

## 2017-08-23 DIAGNOSIS — H40051 Ocular hypertension, right eye: Secondary | ICD-10-CM | POA: Diagnosis not present

## 2017-08-23 DIAGNOSIS — H40043 Steroid responder, bilateral: Secondary | ICD-10-CM | POA: Diagnosis not present

## 2017-08-25 DIAGNOSIS — N2581 Secondary hyperparathyroidism of renal origin: Secondary | ICD-10-CM | POA: Diagnosis not present

## 2017-08-25 DIAGNOSIS — E1129 Type 2 diabetes mellitus with other diabetic kidney complication: Secondary | ICD-10-CM | POA: Diagnosis not present

## 2017-08-25 DIAGNOSIS — T8579XA Infection and inflammatory reaction due to other internal prosthetic devices, implants and grafts, initial encounter: Secondary | ICD-10-CM | POA: Diagnosis not present

## 2017-08-25 DIAGNOSIS — N186 End stage renal disease: Secondary | ICD-10-CM | POA: Diagnosis not present

## 2017-08-25 DIAGNOSIS — E876 Hypokalemia: Secondary | ICD-10-CM | POA: Diagnosis not present

## 2017-08-28 DIAGNOSIS — N186 End stage renal disease: Secondary | ICD-10-CM | POA: Diagnosis not present

## 2017-08-28 DIAGNOSIS — T8579XA Infection and inflammatory reaction due to other internal prosthetic devices, implants and grafts, initial encounter: Secondary | ICD-10-CM | POA: Diagnosis not present

## 2017-08-28 DIAGNOSIS — N2581 Secondary hyperparathyroidism of renal origin: Secondary | ICD-10-CM | POA: Diagnosis not present

## 2017-08-28 DIAGNOSIS — E1129 Type 2 diabetes mellitus with other diabetic kidney complication: Secondary | ICD-10-CM | POA: Diagnosis not present

## 2017-08-28 DIAGNOSIS — E876 Hypokalemia: Secondary | ICD-10-CM | POA: Diagnosis not present

## 2017-08-30 DIAGNOSIS — F329 Major depressive disorder, single episode, unspecified: Secondary | ICD-10-CM | POA: Diagnosis not present

## 2017-08-30 DIAGNOSIS — I10 Essential (primary) hypertension: Secondary | ICD-10-CM | POA: Diagnosis not present

## 2017-08-30 DIAGNOSIS — I5032 Chronic diastolic (congestive) heart failure: Secondary | ICD-10-CM | POA: Diagnosis not present

## 2017-08-30 DIAGNOSIS — E1129 Type 2 diabetes mellitus with other diabetic kidney complication: Secondary | ICD-10-CM | POA: Diagnosis not present

## 2017-08-30 DIAGNOSIS — T8579XA Infection and inflammatory reaction due to other internal prosthetic devices, implants and grafts, initial encounter: Secondary | ICD-10-CM | POA: Diagnosis not present

## 2017-08-30 DIAGNOSIS — E876 Hypokalemia: Secondary | ICD-10-CM | POA: Diagnosis not present

## 2017-08-30 DIAGNOSIS — N186 End stage renal disease: Secondary | ICD-10-CM | POA: Diagnosis not present

## 2017-08-30 DIAGNOSIS — E782 Mixed hyperlipidemia: Secondary | ICD-10-CM | POA: Diagnosis not present

## 2017-08-30 DIAGNOSIS — H409 Unspecified glaucoma: Secondary | ICD-10-CM | POA: Diagnosis not present

## 2017-08-30 DIAGNOSIS — E1122 Type 2 diabetes mellitus with diabetic chronic kidney disease: Secondary | ICD-10-CM | POA: Diagnosis not present

## 2017-08-30 DIAGNOSIS — N2581 Secondary hyperparathyroidism of renal origin: Secondary | ICD-10-CM | POA: Diagnosis not present

## 2017-08-30 DIAGNOSIS — Z794 Long term (current) use of insulin: Secondary | ICD-10-CM | POA: Diagnosis not present

## 2017-09-01 DIAGNOSIS — E1129 Type 2 diabetes mellitus with other diabetic kidney complication: Secondary | ICD-10-CM | POA: Diagnosis not present

## 2017-09-01 DIAGNOSIS — E876 Hypokalemia: Secondary | ICD-10-CM | POA: Diagnosis not present

## 2017-09-01 DIAGNOSIS — N186 End stage renal disease: Secondary | ICD-10-CM | POA: Diagnosis not present

## 2017-09-01 DIAGNOSIS — N2581 Secondary hyperparathyroidism of renal origin: Secondary | ICD-10-CM | POA: Diagnosis not present

## 2017-09-01 DIAGNOSIS — T8579XA Infection and inflammatory reaction due to other internal prosthetic devices, implants and grafts, initial encounter: Secondary | ICD-10-CM | POA: Diagnosis not present

## 2017-09-04 DIAGNOSIS — T86819 Unspecified complication of lung transplant: Secondary | ICD-10-CM | POA: Diagnosis not present

## 2017-09-04 DIAGNOSIS — E1129 Type 2 diabetes mellitus with other diabetic kidney complication: Secondary | ICD-10-CM | POA: Diagnosis not present

## 2017-09-04 DIAGNOSIS — E876 Hypokalemia: Secondary | ICD-10-CM | POA: Diagnosis not present

## 2017-09-04 DIAGNOSIS — N2581 Secondary hyperparathyroidism of renal origin: Secondary | ICD-10-CM | POA: Diagnosis not present

## 2017-09-04 DIAGNOSIS — T8579XA Infection and inflammatory reaction due to other internal prosthetic devices, implants and grafts, initial encounter: Secondary | ICD-10-CM | POA: Diagnosis not present

## 2017-09-04 DIAGNOSIS — Z992 Dependence on renal dialysis: Secondary | ICD-10-CM | POA: Diagnosis not present

## 2017-09-04 DIAGNOSIS — R0902 Hypoxemia: Secondary | ICD-10-CM | POA: Diagnosis not present

## 2017-09-04 DIAGNOSIS — N186 End stage renal disease: Secondary | ICD-10-CM | POA: Diagnosis not present

## 2017-09-05 DIAGNOSIS — R0902 Hypoxemia: Secondary | ICD-10-CM | POA: Diagnosis not present

## 2017-09-06 DIAGNOSIS — E876 Hypokalemia: Secondary | ICD-10-CM | POA: Diagnosis not present

## 2017-09-06 DIAGNOSIS — N2581 Secondary hyperparathyroidism of renal origin: Secondary | ICD-10-CM | POA: Diagnosis not present

## 2017-09-06 DIAGNOSIS — N186 End stage renal disease: Secondary | ICD-10-CM | POA: Diagnosis not present

## 2017-09-06 DIAGNOSIS — E1129 Type 2 diabetes mellitus with other diabetic kidney complication: Secondary | ICD-10-CM | POA: Diagnosis not present

## 2017-09-06 DIAGNOSIS — T8579XA Infection and inflammatory reaction due to other internal prosthetic devices, implants and grafts, initial encounter: Secondary | ICD-10-CM | POA: Diagnosis not present

## 2017-09-07 DIAGNOSIS — N186 End stage renal disease: Secondary | ICD-10-CM | POA: Diagnosis not present

## 2017-09-07 DIAGNOSIS — Z942 Lung transplant status: Secondary | ICD-10-CM | POA: Diagnosis not present

## 2017-09-07 DIAGNOSIS — Z5181 Encounter for therapeutic drug level monitoring: Secondary | ICD-10-CM | POA: Diagnosis not present

## 2017-09-07 DIAGNOSIS — T86819 Unspecified complication of lung transplant: Secondary | ICD-10-CM | POA: Diagnosis not present

## 2017-09-07 DIAGNOSIS — Z298 Encounter for other specified prophylactic measures: Secondary | ICD-10-CM | POA: Diagnosis not present

## 2017-09-07 DIAGNOSIS — Z79899 Other long term (current) drug therapy: Secondary | ICD-10-CM | POA: Diagnosis not present

## 2017-09-08 DIAGNOSIS — E876 Hypokalemia: Secondary | ICD-10-CM | POA: Diagnosis not present

## 2017-09-08 DIAGNOSIS — E1129 Type 2 diabetes mellitus with other diabetic kidney complication: Secondary | ICD-10-CM | POA: Diagnosis not present

## 2017-09-08 DIAGNOSIS — T8579XA Infection and inflammatory reaction due to other internal prosthetic devices, implants and grafts, initial encounter: Secondary | ICD-10-CM | POA: Diagnosis not present

## 2017-09-08 DIAGNOSIS — N2581 Secondary hyperparathyroidism of renal origin: Secondary | ICD-10-CM | POA: Diagnosis not present

## 2017-09-08 DIAGNOSIS — N186 End stage renal disease: Secondary | ICD-10-CM | POA: Diagnosis not present

## 2017-09-11 DIAGNOSIS — N186 End stage renal disease: Secondary | ICD-10-CM | POA: Diagnosis not present

## 2017-09-11 DIAGNOSIS — N2581 Secondary hyperparathyroidism of renal origin: Secondary | ICD-10-CM | POA: Diagnosis not present

## 2017-09-11 DIAGNOSIS — E1129 Type 2 diabetes mellitus with other diabetic kidney complication: Secondary | ICD-10-CM | POA: Diagnosis not present

## 2017-09-11 DIAGNOSIS — T8579XA Infection and inflammatory reaction due to other internal prosthetic devices, implants and grafts, initial encounter: Secondary | ICD-10-CM | POA: Diagnosis not present

## 2017-09-11 DIAGNOSIS — E876 Hypokalemia: Secondary | ICD-10-CM | POA: Diagnosis not present

## 2017-09-12 DIAGNOSIS — Z992 Dependence on renal dialysis: Secondary | ICD-10-CM | POA: Diagnosis not present

## 2017-09-12 DIAGNOSIS — I871 Compression of vein: Secondary | ICD-10-CM | POA: Diagnosis not present

## 2017-09-12 DIAGNOSIS — N186 End stage renal disease: Secondary | ICD-10-CM | POA: Diagnosis not present

## 2017-09-12 DIAGNOSIS — T82858A Stenosis of vascular prosthetic devices, implants and grafts, initial encounter: Secondary | ICD-10-CM | POA: Diagnosis not present

## 2017-09-13 DIAGNOSIS — N2581 Secondary hyperparathyroidism of renal origin: Secondary | ICD-10-CM | POA: Diagnosis not present

## 2017-09-13 DIAGNOSIS — E876 Hypokalemia: Secondary | ICD-10-CM | POA: Diagnosis not present

## 2017-09-13 DIAGNOSIS — T8579XA Infection and inflammatory reaction due to other internal prosthetic devices, implants and grafts, initial encounter: Secondary | ICD-10-CM | POA: Diagnosis not present

## 2017-09-13 DIAGNOSIS — N186 End stage renal disease: Secondary | ICD-10-CM | POA: Diagnosis not present

## 2017-09-13 DIAGNOSIS — E1129 Type 2 diabetes mellitus with other diabetic kidney complication: Secondary | ICD-10-CM | POA: Diagnosis not present

## 2017-09-15 DIAGNOSIS — E876 Hypokalemia: Secondary | ICD-10-CM | POA: Diagnosis not present

## 2017-09-15 DIAGNOSIS — T8579XA Infection and inflammatory reaction due to other internal prosthetic devices, implants and grafts, initial encounter: Secondary | ICD-10-CM | POA: Diagnosis not present

## 2017-09-15 DIAGNOSIS — E1129 Type 2 diabetes mellitus with other diabetic kidney complication: Secondary | ICD-10-CM | POA: Diagnosis not present

## 2017-09-15 DIAGNOSIS — N186 End stage renal disease: Secondary | ICD-10-CM | POA: Diagnosis not present

## 2017-09-15 DIAGNOSIS — N2581 Secondary hyperparathyroidism of renal origin: Secondary | ICD-10-CM | POA: Diagnosis not present

## 2017-09-18 DIAGNOSIS — N2581 Secondary hyperparathyroidism of renal origin: Secondary | ICD-10-CM | POA: Diagnosis not present

## 2017-09-18 DIAGNOSIS — T8579XA Infection and inflammatory reaction due to other internal prosthetic devices, implants and grafts, initial encounter: Secondary | ICD-10-CM | POA: Diagnosis not present

## 2017-09-18 DIAGNOSIS — E876 Hypokalemia: Secondary | ICD-10-CM | POA: Diagnosis not present

## 2017-09-18 DIAGNOSIS — N186 End stage renal disease: Secondary | ICD-10-CM | POA: Diagnosis not present

## 2017-09-18 DIAGNOSIS — E1129 Type 2 diabetes mellitus with other diabetic kidney complication: Secondary | ICD-10-CM | POA: Diagnosis not present

## 2017-09-20 DIAGNOSIS — E1129 Type 2 diabetes mellitus with other diabetic kidney complication: Secondary | ICD-10-CM | POA: Diagnosis not present

## 2017-09-20 DIAGNOSIS — E876 Hypokalemia: Secondary | ICD-10-CM | POA: Diagnosis not present

## 2017-09-20 DIAGNOSIS — T8579XA Infection and inflammatory reaction due to other internal prosthetic devices, implants and grafts, initial encounter: Secondary | ICD-10-CM | POA: Diagnosis not present

## 2017-09-20 DIAGNOSIS — N186 End stage renal disease: Secondary | ICD-10-CM | POA: Diagnosis not present

## 2017-09-20 DIAGNOSIS — N2581 Secondary hyperparathyroidism of renal origin: Secondary | ICD-10-CM | POA: Diagnosis not present

## 2017-09-22 DIAGNOSIS — T8579XA Infection and inflammatory reaction due to other internal prosthetic devices, implants and grafts, initial encounter: Secondary | ICD-10-CM | POA: Diagnosis not present

## 2017-09-22 DIAGNOSIS — E876 Hypokalemia: Secondary | ICD-10-CM | POA: Diagnosis not present

## 2017-09-22 DIAGNOSIS — N2581 Secondary hyperparathyroidism of renal origin: Secondary | ICD-10-CM | POA: Diagnosis not present

## 2017-09-22 DIAGNOSIS — E1129 Type 2 diabetes mellitus with other diabetic kidney complication: Secondary | ICD-10-CM | POA: Diagnosis not present

## 2017-09-22 DIAGNOSIS — N186 End stage renal disease: Secondary | ICD-10-CM | POA: Diagnosis not present

## 2017-09-25 DIAGNOSIS — E876 Hypokalemia: Secondary | ICD-10-CM | POA: Diagnosis not present

## 2017-09-25 DIAGNOSIS — T8579XA Infection and inflammatory reaction due to other internal prosthetic devices, implants and grafts, initial encounter: Secondary | ICD-10-CM | POA: Diagnosis not present

## 2017-09-25 DIAGNOSIS — N2581 Secondary hyperparathyroidism of renal origin: Secondary | ICD-10-CM | POA: Diagnosis not present

## 2017-09-25 DIAGNOSIS — N186 End stage renal disease: Secondary | ICD-10-CM | POA: Diagnosis not present

## 2017-09-25 DIAGNOSIS — E1129 Type 2 diabetes mellitus with other diabetic kidney complication: Secondary | ICD-10-CM | POA: Diagnosis not present

## 2017-09-26 DIAGNOSIS — N2581 Secondary hyperparathyroidism of renal origin: Secondary | ICD-10-CM | POA: Diagnosis not present

## 2017-09-26 DIAGNOSIS — E876 Hypokalemia: Secondary | ICD-10-CM | POA: Diagnosis not present

## 2017-09-26 DIAGNOSIS — E1129 Type 2 diabetes mellitus with other diabetic kidney complication: Secondary | ICD-10-CM | POA: Diagnosis not present

## 2017-09-26 DIAGNOSIS — T8579XA Infection and inflammatory reaction due to other internal prosthetic devices, implants and grafts, initial encounter: Secondary | ICD-10-CM | POA: Diagnosis not present

## 2017-09-26 DIAGNOSIS — N186 End stage renal disease: Secondary | ICD-10-CM | POA: Diagnosis not present

## 2017-09-29 DIAGNOSIS — N2581 Secondary hyperparathyroidism of renal origin: Secondary | ICD-10-CM | POA: Diagnosis not present

## 2017-09-29 DIAGNOSIS — E876 Hypokalemia: Secondary | ICD-10-CM | POA: Diagnosis not present

## 2017-09-29 DIAGNOSIS — E1129 Type 2 diabetes mellitus with other diabetic kidney complication: Secondary | ICD-10-CM | POA: Diagnosis not present

## 2017-09-29 DIAGNOSIS — T8579XA Infection and inflammatory reaction due to other internal prosthetic devices, implants and grafts, initial encounter: Secondary | ICD-10-CM | POA: Diagnosis not present

## 2017-09-29 DIAGNOSIS — N186 End stage renal disease: Secondary | ICD-10-CM | POA: Diagnosis not present

## 2017-10-02 DIAGNOSIS — E876 Hypokalemia: Secondary | ICD-10-CM | POA: Diagnosis not present

## 2017-10-02 DIAGNOSIS — T8579XA Infection and inflammatory reaction due to other internal prosthetic devices, implants and grafts, initial encounter: Secondary | ICD-10-CM | POA: Diagnosis not present

## 2017-10-02 DIAGNOSIS — N2581 Secondary hyperparathyroidism of renal origin: Secondary | ICD-10-CM | POA: Diagnosis not present

## 2017-10-02 DIAGNOSIS — E1129 Type 2 diabetes mellitus with other diabetic kidney complication: Secondary | ICD-10-CM | POA: Diagnosis not present

## 2017-10-02 DIAGNOSIS — N186 End stage renal disease: Secondary | ICD-10-CM | POA: Diagnosis not present

## 2017-10-04 DIAGNOSIS — D631 Anemia in chronic kidney disease: Secondary | ICD-10-CM | POA: Diagnosis not present

## 2017-10-04 DIAGNOSIS — N186 End stage renal disease: Secondary | ICD-10-CM | POA: Diagnosis not present

## 2017-10-04 DIAGNOSIS — T86819 Unspecified complication of lung transplant: Secondary | ICD-10-CM | POA: Diagnosis not present

## 2017-10-04 DIAGNOSIS — E876 Hypokalemia: Secondary | ICD-10-CM | POA: Diagnosis not present

## 2017-10-04 DIAGNOSIS — Z992 Dependence on renal dialysis: Secondary | ICD-10-CM | POA: Diagnosis not present

## 2017-10-04 DIAGNOSIS — N2581 Secondary hyperparathyroidism of renal origin: Secondary | ICD-10-CM | POA: Diagnosis not present

## 2017-10-04 DIAGNOSIS — T8579XA Infection and inflammatory reaction due to other internal prosthetic devices, implants and grafts, initial encounter: Secondary | ICD-10-CM | POA: Diagnosis not present

## 2017-10-06 DIAGNOSIS — E876 Hypokalemia: Secondary | ICD-10-CM | POA: Diagnosis not present

## 2017-10-06 DIAGNOSIS — D631 Anemia in chronic kidney disease: Secondary | ICD-10-CM | POA: Diagnosis not present

## 2017-10-06 DIAGNOSIS — N186 End stage renal disease: Secondary | ICD-10-CM | POA: Diagnosis not present

## 2017-10-06 DIAGNOSIS — N2581 Secondary hyperparathyroidism of renal origin: Secondary | ICD-10-CM | POA: Diagnosis not present

## 2017-10-06 DIAGNOSIS — T8579XA Infection and inflammatory reaction due to other internal prosthetic devices, implants and grafts, initial encounter: Secondary | ICD-10-CM | POA: Diagnosis not present

## 2017-10-09 DIAGNOSIS — E876 Hypokalemia: Secondary | ICD-10-CM | POA: Diagnosis not present

## 2017-10-09 DIAGNOSIS — T8579XA Infection and inflammatory reaction due to other internal prosthetic devices, implants and grafts, initial encounter: Secondary | ICD-10-CM | POA: Diagnosis not present

## 2017-10-09 DIAGNOSIS — N186 End stage renal disease: Secondary | ICD-10-CM | POA: Diagnosis not present

## 2017-10-09 DIAGNOSIS — D631 Anemia in chronic kidney disease: Secondary | ICD-10-CM | POA: Diagnosis not present

## 2017-10-09 DIAGNOSIS — N2581 Secondary hyperparathyroidism of renal origin: Secondary | ICD-10-CM | POA: Diagnosis not present

## 2017-10-11 DIAGNOSIS — N186 End stage renal disease: Secondary | ICD-10-CM | POA: Diagnosis not present

## 2017-10-11 DIAGNOSIS — E876 Hypokalemia: Secondary | ICD-10-CM | POA: Diagnosis not present

## 2017-10-11 DIAGNOSIS — T8579XA Infection and inflammatory reaction due to other internal prosthetic devices, implants and grafts, initial encounter: Secondary | ICD-10-CM | POA: Diagnosis not present

## 2017-10-11 DIAGNOSIS — N2581 Secondary hyperparathyroidism of renal origin: Secondary | ICD-10-CM | POA: Diagnosis not present

## 2017-10-11 DIAGNOSIS — D631 Anemia in chronic kidney disease: Secondary | ICD-10-CM | POA: Diagnosis not present

## 2017-10-13 DIAGNOSIS — E876 Hypokalemia: Secondary | ICD-10-CM | POA: Diagnosis not present

## 2017-10-13 DIAGNOSIS — N2581 Secondary hyperparathyroidism of renal origin: Secondary | ICD-10-CM | POA: Diagnosis not present

## 2017-10-13 DIAGNOSIS — T8579XA Infection and inflammatory reaction due to other internal prosthetic devices, implants and grafts, initial encounter: Secondary | ICD-10-CM | POA: Diagnosis not present

## 2017-10-13 DIAGNOSIS — D631 Anemia in chronic kidney disease: Secondary | ICD-10-CM | POA: Diagnosis not present

## 2017-10-13 DIAGNOSIS — N186 End stage renal disease: Secondary | ICD-10-CM | POA: Diagnosis not present

## 2017-10-16 DIAGNOSIS — H40043 Steroid responder, bilateral: Secondary | ICD-10-CM | POA: Diagnosis not present

## 2017-10-16 DIAGNOSIS — T8579XA Infection and inflammatory reaction due to other internal prosthetic devices, implants and grafts, initial encounter: Secondary | ICD-10-CM | POA: Diagnosis not present

## 2017-10-16 DIAGNOSIS — D631 Anemia in chronic kidney disease: Secondary | ICD-10-CM | POA: Diagnosis not present

## 2017-10-16 DIAGNOSIS — N186 End stage renal disease: Secondary | ICD-10-CM | POA: Diagnosis not present

## 2017-10-16 DIAGNOSIS — H04202 Unspecified epiphora, left lacrimal gland: Secondary | ICD-10-CM | POA: Diagnosis not present

## 2017-10-16 DIAGNOSIS — H04203 Unspecified epiphora, bilateral lacrimal glands: Secondary | ICD-10-CM | POA: Diagnosis not present

## 2017-10-16 DIAGNOSIS — E876 Hypokalemia: Secondary | ICD-10-CM | POA: Diagnosis not present

## 2017-10-16 DIAGNOSIS — H26493 Other secondary cataract, bilateral: Secondary | ICD-10-CM | POA: Diagnosis not present

## 2017-10-16 DIAGNOSIS — N2581 Secondary hyperparathyroidism of renal origin: Secondary | ICD-10-CM | POA: Diagnosis not present

## 2017-10-16 DIAGNOSIS — H04201 Unspecified epiphora, right lacrimal gland: Secondary | ICD-10-CM | POA: Diagnosis not present

## 2017-10-16 DIAGNOSIS — E119 Type 2 diabetes mellitus without complications: Secondary | ICD-10-CM | POA: Diagnosis not present

## 2017-10-18 DIAGNOSIS — N2581 Secondary hyperparathyroidism of renal origin: Secondary | ICD-10-CM | POA: Diagnosis not present

## 2017-10-18 DIAGNOSIS — N186 End stage renal disease: Secondary | ICD-10-CM | POA: Diagnosis not present

## 2017-10-18 DIAGNOSIS — D631 Anemia in chronic kidney disease: Secondary | ICD-10-CM | POA: Diagnosis not present

## 2017-10-18 DIAGNOSIS — T8579XA Infection and inflammatory reaction due to other internal prosthetic devices, implants and grafts, initial encounter: Secondary | ICD-10-CM | POA: Diagnosis not present

## 2017-10-18 DIAGNOSIS — E876 Hypokalemia: Secondary | ICD-10-CM | POA: Diagnosis not present

## 2017-10-19 DIAGNOSIS — Z5181 Encounter for therapeutic drug level monitoring: Secondary | ICD-10-CM | POA: Diagnosis not present

## 2017-10-19 DIAGNOSIS — Z942 Lung transplant status: Secondary | ICD-10-CM | POA: Diagnosis not present

## 2017-10-19 DIAGNOSIS — Z298 Encounter for other specified prophylactic measures: Secondary | ICD-10-CM | POA: Diagnosis not present

## 2017-10-19 DIAGNOSIS — T86819 Unspecified complication of lung transplant: Secondary | ICD-10-CM | POA: Diagnosis not present

## 2017-10-19 DIAGNOSIS — Z79899 Other long term (current) drug therapy: Secondary | ICD-10-CM | POA: Diagnosis not present

## 2017-10-20 DIAGNOSIS — E876 Hypokalemia: Secondary | ICD-10-CM | POA: Diagnosis not present

## 2017-10-20 DIAGNOSIS — N2581 Secondary hyperparathyroidism of renal origin: Secondary | ICD-10-CM | POA: Diagnosis not present

## 2017-10-20 DIAGNOSIS — T8579XA Infection and inflammatory reaction due to other internal prosthetic devices, implants and grafts, initial encounter: Secondary | ICD-10-CM | POA: Diagnosis not present

## 2017-10-20 DIAGNOSIS — N186 End stage renal disease: Secondary | ICD-10-CM | POA: Diagnosis not present

## 2017-10-20 DIAGNOSIS — D631 Anemia in chronic kidney disease: Secondary | ICD-10-CM | POA: Diagnosis not present

## 2017-10-23 DIAGNOSIS — T8579XA Infection and inflammatory reaction due to other internal prosthetic devices, implants and grafts, initial encounter: Secondary | ICD-10-CM | POA: Diagnosis not present

## 2017-10-23 DIAGNOSIS — N186 End stage renal disease: Secondary | ICD-10-CM | POA: Diagnosis not present

## 2017-10-23 DIAGNOSIS — D631 Anemia in chronic kidney disease: Secondary | ICD-10-CM | POA: Diagnosis not present

## 2017-10-23 DIAGNOSIS — E876 Hypokalemia: Secondary | ICD-10-CM | POA: Diagnosis not present

## 2017-10-23 DIAGNOSIS — N2581 Secondary hyperparathyroidism of renal origin: Secondary | ICD-10-CM | POA: Diagnosis not present

## 2017-10-24 DIAGNOSIS — H26492 Other secondary cataract, left eye: Secondary | ICD-10-CM | POA: Diagnosis not present

## 2017-10-25 DIAGNOSIS — N2581 Secondary hyperparathyroidism of renal origin: Secondary | ICD-10-CM | POA: Diagnosis not present

## 2017-10-25 DIAGNOSIS — T8579XA Infection and inflammatory reaction due to other internal prosthetic devices, implants and grafts, initial encounter: Secondary | ICD-10-CM | POA: Diagnosis not present

## 2017-10-25 DIAGNOSIS — N186 End stage renal disease: Secondary | ICD-10-CM | POA: Diagnosis not present

## 2017-10-25 DIAGNOSIS — E876 Hypokalemia: Secondary | ICD-10-CM | POA: Diagnosis not present

## 2017-10-25 DIAGNOSIS — D631 Anemia in chronic kidney disease: Secondary | ICD-10-CM | POA: Diagnosis not present

## 2017-10-27 DIAGNOSIS — N2581 Secondary hyperparathyroidism of renal origin: Secondary | ICD-10-CM | POA: Diagnosis not present

## 2017-10-27 DIAGNOSIS — T8579XA Infection and inflammatory reaction due to other internal prosthetic devices, implants and grafts, initial encounter: Secondary | ICD-10-CM | POA: Diagnosis not present

## 2017-10-27 DIAGNOSIS — E876 Hypokalemia: Secondary | ICD-10-CM | POA: Diagnosis not present

## 2017-10-27 DIAGNOSIS — N186 End stage renal disease: Secondary | ICD-10-CM | POA: Diagnosis not present

## 2017-10-27 DIAGNOSIS — D631 Anemia in chronic kidney disease: Secondary | ICD-10-CM | POA: Diagnosis not present

## 2017-10-27 IMAGING — CR DG CHEST 1V PORT
1 series · 1 of 1 positions shown · non-contrast
Comparison: 04/18/2015

ADDENDUM:
Critical Value/emergent results were discussed with the patient's
provider ZED KARGBO on 04/18/2015 at time [DATE].
CLINICAL DATA: Pt intubated w/ central line AND OG tube

EXAM:
PORTABLE CHEST 1 VIEW

[AP]
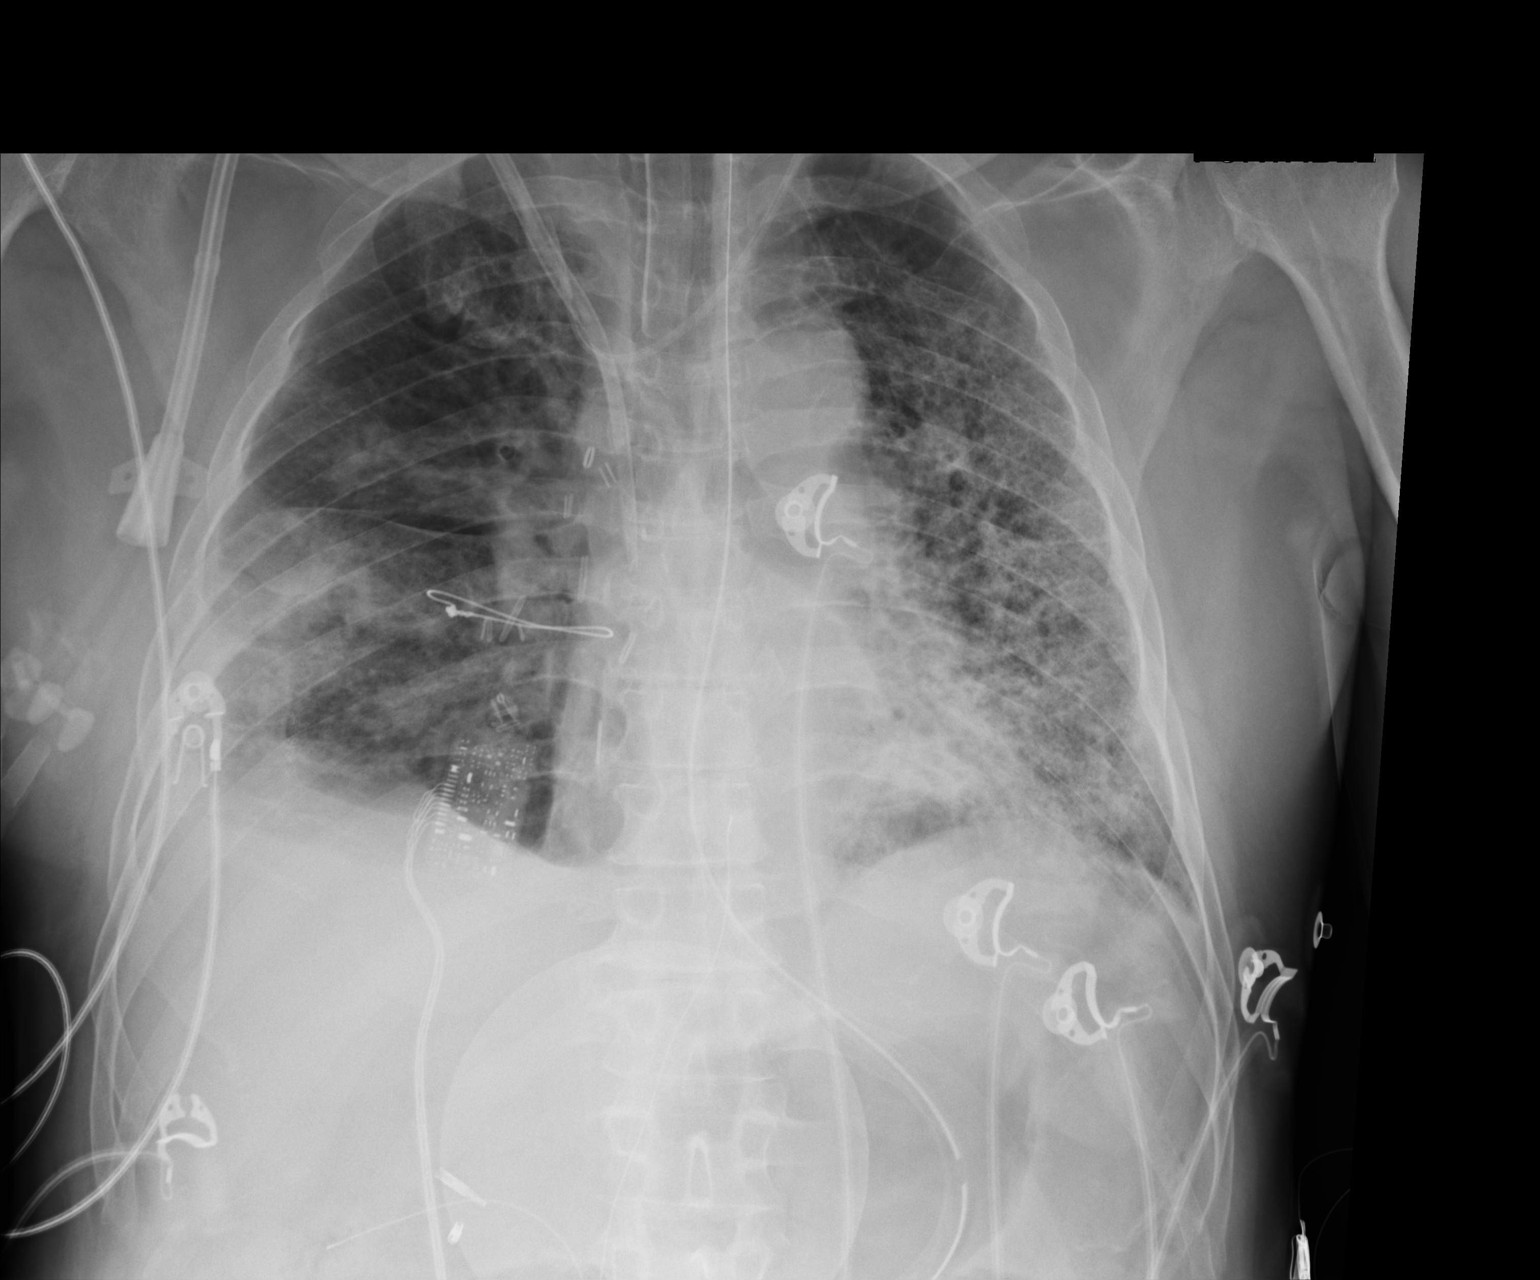

[1 of 1 positions shown; findings below may reference images not displayed]

FINDINGS: Endotracheal tube is in place with tip 3.6 cm above carina. Right
dialysis catheter tips overlie the lower pole of the lower superior
vena cava. Nasogastric tube tip is off the film and beyond the
gastroesophageal junction. Left IJ central line tip is redirected
cephalad likely in the proximal right subclavian vein.

Heart is not normal in size. There patchy densities within the
lungs. More confluent opacity is noted at the left lung base
compared prior study. There is no pneumothorax.
IMPRESSION: 1. Interval placement of endotracheal tube, nasogastric tube, and
left IJ central line.
2. IJ line crosses midline and courses cephalad.  See above.
3. Increased infiltrate at the left lung base.

Critical Value/emergent results will be telephoned to the patient's
provider ZED KARGBO at the time of interpretation on date
04/18/2015 at time [DATE].

## 2017-10-27 IMAGING — CR DG ABD PORTABLE 1V
1 series · 1 of 1 positions shown · non-contrast
Comparison: Chest x-ray same day.

CLINICAL DATA: Bowel perforation.

EXAM:
PORTABLE ABDOMEN - 1 VIEW

[AP]
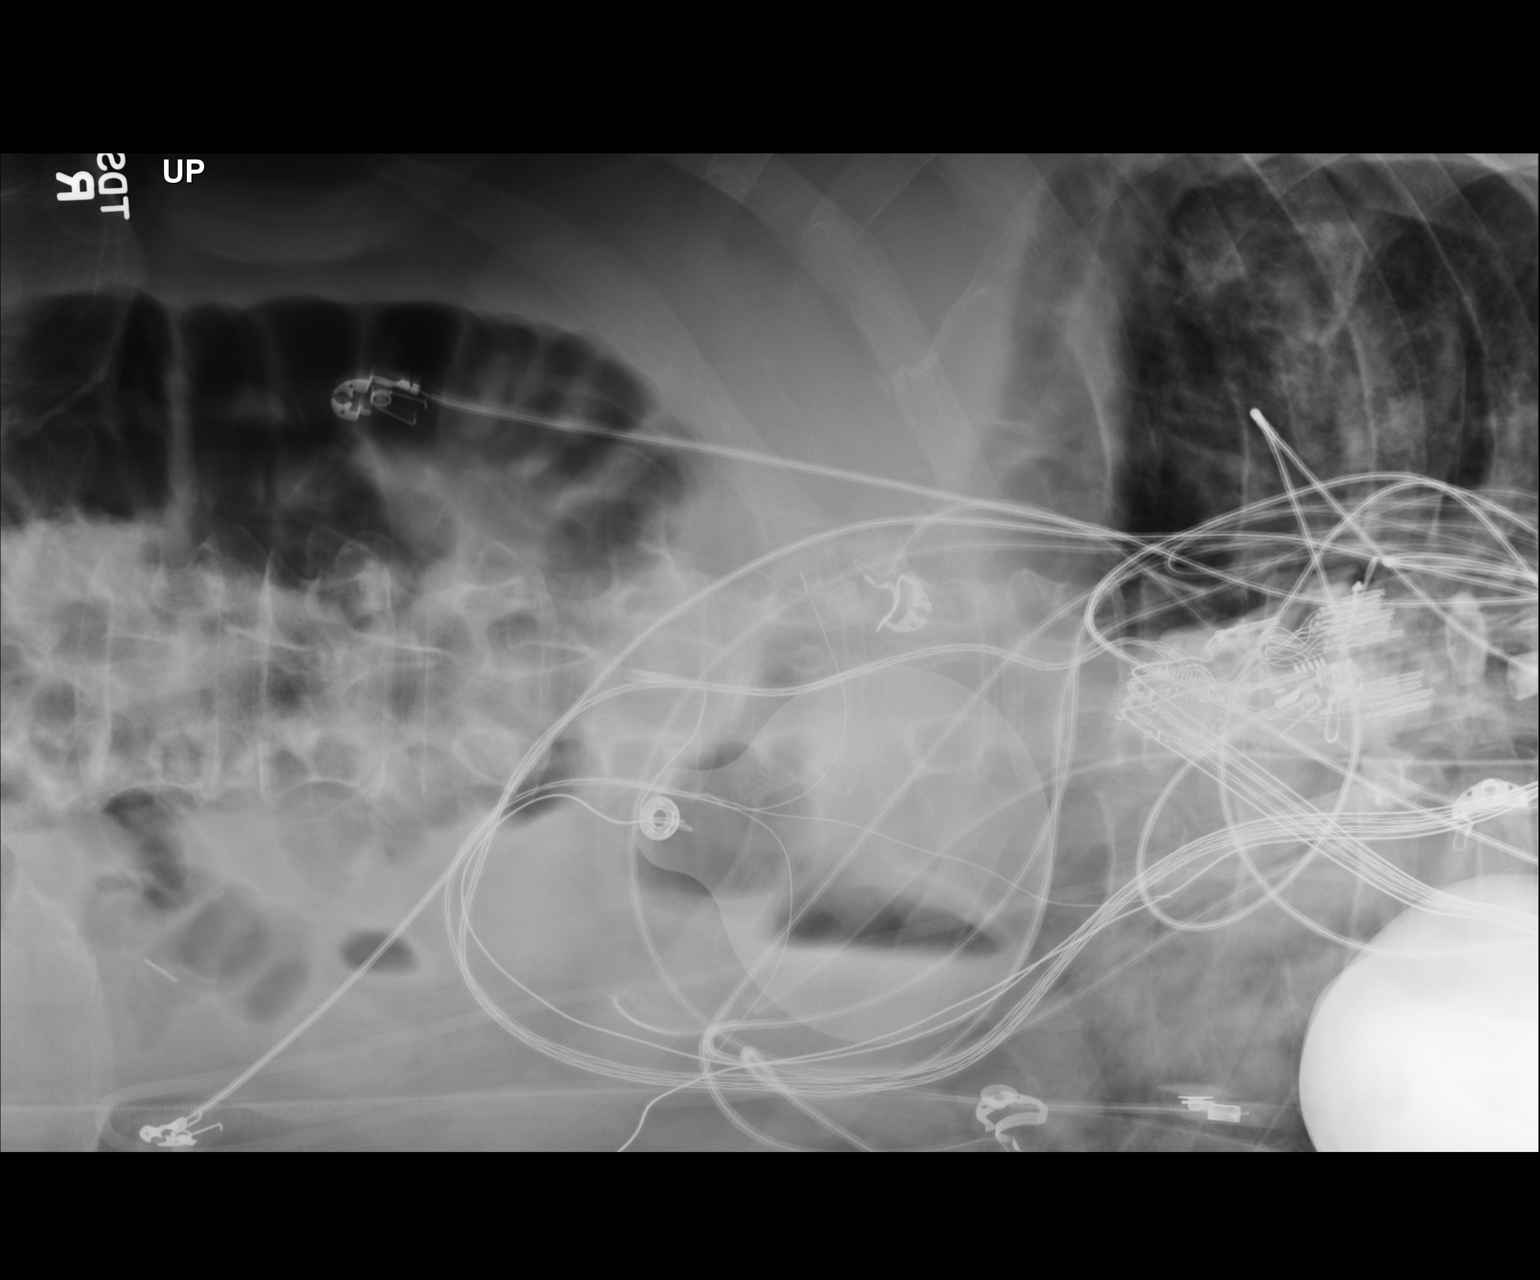

[1 of 1 positions shown; findings below may reference images not displayed]

FINDINGS: There appears to be an enteric tube with tip overlying the stomach.
Right-sided decubitus film demonstrates no evidence of free
peritoneal air. No dilated small bowel loops. Few air-fluid levels
over the left upper quadrant and left abdomen. Remainder of the exam
is unchanged.
IMPRESSION: Nonobstructive bowel gas pattern.  No free peritoneal air.

## 2017-10-27 IMAGING — CR DG CHEST 1V PORT
1 series · 1 of 1 positions shown · non-contrast
Comparison: 08/18/2013

CLINICAL DATA: Per [REDACTED] patient dialyzed yesterday and woke up
this morning feeling weak. On EMS arrival to patient's home
respirations in 40's, hypotensive, tachycardia. Patient arrived to
ED on NRB, patient is alert and oriented x4. Patient is shivering.

EXAM:
PORTABLE CHEST 1 VIEW

[AP]
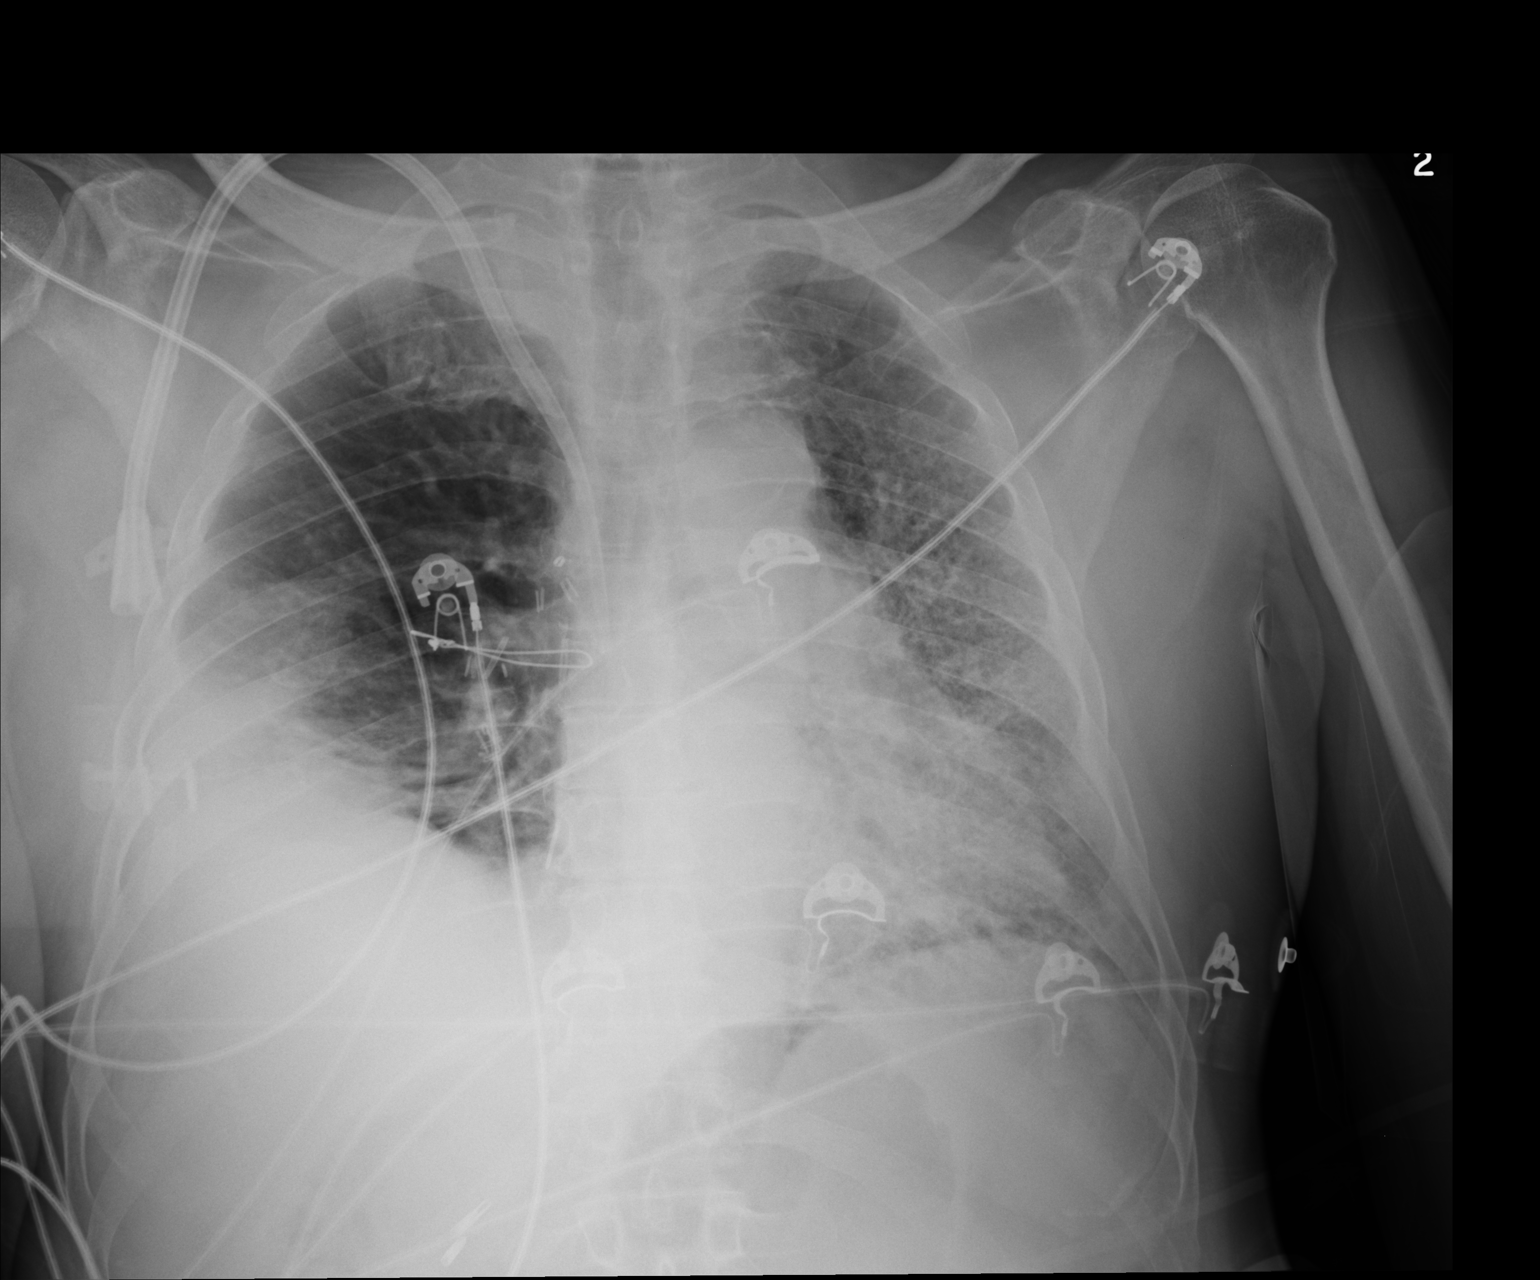

[1 of 1 positions shown; findings below may reference images not displayed]

FINDINGS: Right-sided dialysis catheter tip overlies the level of the lower
superior vena cava. The heart is normal in size. Again noted are
fibrotic changes within the lungs. More focal opacity at the right
lateral lung base is consistent with consolidation and pleural
effusion.
IMPRESSION: 1. Pulmonary fibrosis.
2. New right lower lobe consolidation and pleural effusion.

## 2017-10-28 IMAGING — CR DG CHEST 1V PORT
1 series · 1 of 1 positions shown · non-contrast
Comparison: Yesterday

CLINICAL DATA: Respiratory failure.  Shock.

EXAM:
PORTABLE CHEST 1 VIEW

[AP]
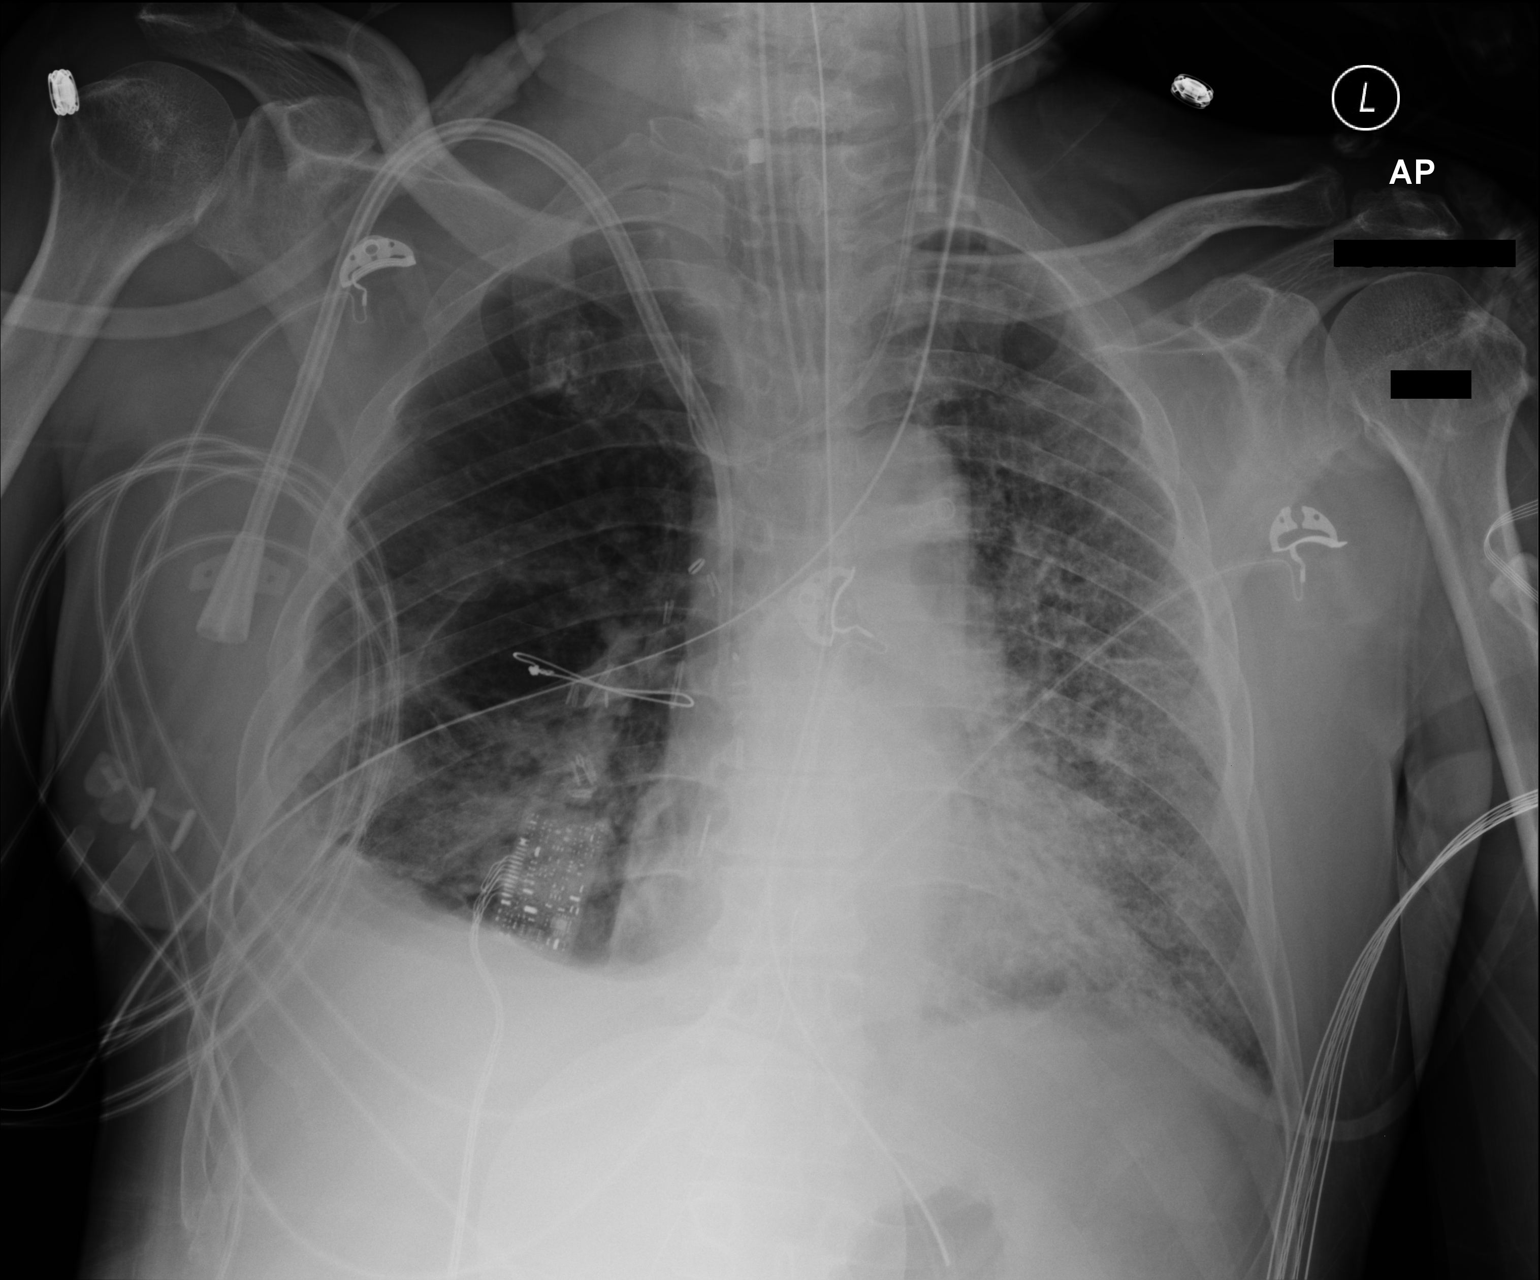

[1 of 1 positions shown; findings below may reference images not displayed]

FINDINGS: Endotracheal tube tip in good position between the clavicular heads
and carina. Unchanged positioning of left IJ catheter, tip ascending
in the right brachiocephalic vein. Tunneled catheter on the right is
in good position with tip at the SVC level. Orogastric tube tip at
least reaches the stomach.

Partial clearing of right lung opacity, suspected pneumonia given
history septic shock. There is chronic blunting of the right lateral
costophrenic sulcus with possible superimposed small effusion. The
right lung is more lucent than on previous imaging, presumably
hyperinflation of the remaining lung given there are postoperative
changes at the right hilum. Diffuse interstitial coarsening of the
left lung in this patient of pulmonary fibrosis. Normal heart size.
No visible pneumothorax.
IMPRESSION: 1. Unchanged positioning of tubes and lines, including left IJ
catheter with tip at the right brachiocephalic vein.
2. Partial clearing of right basilar opacity, suspected pneumonia.
3. Pulmonary fibrosis and postoperative changes on the right.

## 2017-10-30 DIAGNOSIS — D631 Anemia in chronic kidney disease: Secondary | ICD-10-CM | POA: Diagnosis not present

## 2017-10-30 DIAGNOSIS — N186 End stage renal disease: Secondary | ICD-10-CM | POA: Diagnosis not present

## 2017-10-30 DIAGNOSIS — T8579XA Infection and inflammatory reaction due to other internal prosthetic devices, implants and grafts, initial encounter: Secondary | ICD-10-CM | POA: Diagnosis not present

## 2017-10-30 DIAGNOSIS — N2581 Secondary hyperparathyroidism of renal origin: Secondary | ICD-10-CM | POA: Diagnosis not present

## 2017-10-30 DIAGNOSIS — E876 Hypokalemia: Secondary | ICD-10-CM | POA: Diagnosis not present

## 2017-10-31 DIAGNOSIS — R066 Hiccough: Secondary | ICD-10-CM | POA: Diagnosis not present

## 2017-10-31 DIAGNOSIS — R142 Eructation: Secondary | ICD-10-CM | POA: Diagnosis not present

## 2017-11-01 DIAGNOSIS — T8579XA Infection and inflammatory reaction due to other internal prosthetic devices, implants and grafts, initial encounter: Secondary | ICD-10-CM | POA: Diagnosis not present

## 2017-11-01 DIAGNOSIS — N186 End stage renal disease: Secondary | ICD-10-CM | POA: Diagnosis not present

## 2017-11-01 DIAGNOSIS — E876 Hypokalemia: Secondary | ICD-10-CM | POA: Diagnosis not present

## 2017-11-01 DIAGNOSIS — N2581 Secondary hyperparathyroidism of renal origin: Secondary | ICD-10-CM | POA: Diagnosis not present

## 2017-11-01 DIAGNOSIS — D631 Anemia in chronic kidney disease: Secondary | ICD-10-CM | POA: Diagnosis not present

## 2017-11-03 DIAGNOSIS — E876 Hypokalemia: Secondary | ICD-10-CM | POA: Diagnosis not present

## 2017-11-03 DIAGNOSIS — N186 End stage renal disease: Secondary | ICD-10-CM | POA: Diagnosis not present

## 2017-11-03 DIAGNOSIS — T8579XA Infection and inflammatory reaction due to other internal prosthetic devices, implants and grafts, initial encounter: Secondary | ICD-10-CM | POA: Diagnosis not present

## 2017-11-03 DIAGNOSIS — D631 Anemia in chronic kidney disease: Secondary | ICD-10-CM | POA: Diagnosis not present

## 2017-11-03 DIAGNOSIS — N2581 Secondary hyperparathyroidism of renal origin: Secondary | ICD-10-CM | POA: Diagnosis not present

## 2017-11-04 DIAGNOSIS — Z992 Dependence on renal dialysis: Secondary | ICD-10-CM | POA: Diagnosis not present

## 2017-11-04 DIAGNOSIS — N186 End stage renal disease: Secondary | ICD-10-CM | POA: Diagnosis not present

## 2017-11-04 DIAGNOSIS — T86819 Unspecified complication of lung transplant: Secondary | ICD-10-CM | POA: Diagnosis not present

## 2017-11-06 DIAGNOSIS — D631 Anemia in chronic kidney disease: Secondary | ICD-10-CM | POA: Diagnosis not present

## 2017-11-06 DIAGNOSIS — T8579XA Infection and inflammatory reaction due to other internal prosthetic devices, implants and grafts, initial encounter: Secondary | ICD-10-CM | POA: Diagnosis not present

## 2017-11-06 DIAGNOSIS — N2581 Secondary hyperparathyroidism of renal origin: Secondary | ICD-10-CM | POA: Diagnosis not present

## 2017-11-06 DIAGNOSIS — N186 End stage renal disease: Secondary | ICD-10-CM | POA: Diagnosis not present

## 2017-11-06 DIAGNOSIS — E876 Hypokalemia: Secondary | ICD-10-CM | POA: Diagnosis not present

## 2017-11-07 ENCOUNTER — Ambulatory Visit (HOSPITAL_COMMUNITY): Payer: Medicare Other | Admitting: Anesthesiology

## 2017-11-07 ENCOUNTER — Encounter (HOSPITAL_COMMUNITY): Payer: Self-pay | Admitting: Anesthesiology

## 2017-11-07 ENCOUNTER — Ambulatory Visit (HOSPITAL_COMMUNITY)
Admission: RE | Admit: 2017-11-07 | Discharge: 2017-11-07 | Disposition: A | Discharge status: Home / Self Care | Payer: Medicare Other | Source: Ambulatory Visit | Attending: Gastroenterology | Admitting: Gastroenterology

## 2017-11-07 ENCOUNTER — Encounter (HOSPITAL_COMMUNITY)
Admission: RE | Disposition: A | Discharge status: Home / Self Care | Payer: Self-pay | Source: Ambulatory Visit | Attending: Gastroenterology

## 2017-11-07 ENCOUNTER — Other Ambulatory Visit: Payer: Self-pay | Admitting: Gastroenterology

## 2017-11-07 ENCOUNTER — Other Ambulatory Visit: Payer: Self-pay

## 2017-11-07 DIAGNOSIS — J849 Interstitial pulmonary disease, unspecified: Secondary | ICD-10-CM | POA: Insufficient documentation

## 2017-11-07 DIAGNOSIS — M1990 Unspecified osteoarthritis, unspecified site: Secondary | ICD-10-CM | POA: Diagnosis not present

## 2017-11-07 DIAGNOSIS — Z992 Dependence on renal dialysis: Secondary | ICD-10-CM | POA: Insufficient documentation

## 2017-11-07 DIAGNOSIS — E1122 Type 2 diabetes mellitus with diabetic chronic kidney disease: Secondary | ICD-10-CM | POA: Diagnosis not present

## 2017-11-07 DIAGNOSIS — K5730 Diverticulosis of large intestine without perforation or abscess without bleeding: Secondary | ICD-10-CM | POA: Diagnosis not present

## 2017-11-07 DIAGNOSIS — K5520 Angiodysplasia of colon without hemorrhage: Secondary | ICD-10-CM | POA: Diagnosis not present

## 2017-11-07 DIAGNOSIS — Z86010 Personal history of colonic polyps: Secondary | ICD-10-CM | POA: Diagnosis not present

## 2017-11-07 DIAGNOSIS — I120 Hypertensive chronic kidney disease with stage 5 chronic kidney disease or end stage renal disease: Secondary | ICD-10-CM | POA: Diagnosis not present

## 2017-11-07 DIAGNOSIS — Z8249 Family history of ischemic heart disease and other diseases of the circulatory system: Secondary | ICD-10-CM | POA: Insufficient documentation

## 2017-11-07 DIAGNOSIS — Z800 Family history of malignant neoplasm of digestive organs: Secondary | ICD-10-CM | POA: Insufficient documentation

## 2017-11-07 DIAGNOSIS — D124 Benign neoplasm of descending colon: Secondary | ICD-10-CM | POA: Diagnosis not present

## 2017-11-07 DIAGNOSIS — R0602 Shortness of breath: Secondary | ICD-10-CM | POA: Insufficient documentation

## 2017-11-07 DIAGNOSIS — Z978 Presence of other specified devices: Secondary | ICD-10-CM | POA: Diagnosis not present

## 2017-11-07 DIAGNOSIS — N186 End stage renal disease: Secondary | ICD-10-CM | POA: Diagnosis not present

## 2017-11-07 DIAGNOSIS — Z794 Long term (current) use of insulin: Secondary | ICD-10-CM | POA: Diagnosis not present

## 2017-11-07 DIAGNOSIS — D122 Benign neoplasm of ascending colon: Secondary | ICD-10-CM | POA: Diagnosis not present

## 2017-11-07 DIAGNOSIS — Z79899 Other long term (current) drug therapy: Secondary | ICD-10-CM | POA: Diagnosis not present

## 2017-11-07 DIAGNOSIS — Z888 Allergy status to other drugs, medicaments and biological substances status: Secondary | ICD-10-CM | POA: Insufficient documentation

## 2017-11-07 DIAGNOSIS — Z1211 Encounter for screening for malignant neoplasm of colon: Secondary | ICD-10-CM | POA: Insufficient documentation

## 2017-11-07 DIAGNOSIS — K5790 Diverticulosis of intestine, part unspecified, without perforation or abscess without bleeding: Secondary | ICD-10-CM | POA: Insufficient documentation

## 2017-11-07 DIAGNOSIS — K649 Unspecified hemorrhoids: Secondary | ICD-10-CM | POA: Insufficient documentation

## 2017-11-07 DIAGNOSIS — Z87891 Personal history of nicotine dependence: Secondary | ICD-10-CM | POA: Diagnosis not present

## 2017-11-07 DIAGNOSIS — D123 Benign neoplasm of transverse colon: Secondary | ICD-10-CM | POA: Insufficient documentation

## 2017-11-07 DIAGNOSIS — G4730 Sleep apnea, unspecified: Secondary | ICD-10-CM | POA: Diagnosis not present

## 2017-11-07 DIAGNOSIS — H409 Unspecified glaucoma: Secondary | ICD-10-CM | POA: Diagnosis not present

## 2017-11-07 HISTORY — PX: COLONOSCOPY WITH PROPOFOL: SHX5780

## 2017-11-07 HISTORY — PX: POLYPECTOMY: SHX5525

## 2017-11-07 SURGERY — COLONOSCOPY WITH PROPOFOL
Anesthesia: Monitor Anesthesia Care

## 2017-11-07 MED ORDER — SODIUM CHLORIDE 0.9 % IV SOLN
INTRAVENOUS | Status: DC | PRN
  Administered 2017-11-07: 13:00:00 via INTRAVENOUS

## 2017-11-07 MED ORDER — PROPOFOL 10 MG/ML IV BOLUS
INTRAVENOUS | Status: AC
  Filled 2017-11-07: qty 40

## 2017-11-07 MED ORDER — ONDANSETRON HCL 4 MG/2ML IJ SOLN
INTRAMUSCULAR | Status: DC | PRN
  Administered 2017-11-07: 4 mg via INTRAVENOUS

## 2017-11-07 MED ORDER — PROPOFOL 500 MG/50ML IV EMUL
INTRAVENOUS | Status: DC | PRN
  Administered 2017-11-07: 100 ug/kg/min via INTRAVENOUS

## 2017-11-07 MED ORDER — PROPOFOL 500 MG/50ML IV EMUL
INTRAVENOUS | Status: DC | PRN
  Administered 2017-11-07: 40 mg via INTRAVENOUS

## 2017-11-07 SURGICAL SUPPLY — 21 items

## 2017-11-07 NOTE — Anesthesia Preprocedure Evaluation (Addendum)
Anesthesia Evaluation  Patient identified by MRN, date of birth, ID band Patient awake    Reviewed: Allergy & Precautions, NPO status , Patient's Chart, lab work & pertinent test results  Airway Mallampati: II  TM Distance: >3 FB Neck ROM: Full    Dental  (+) Poor Dentition, Missing, Caps   Pulmonary shortness of breath and with exertion, sleep apnea and Continuous Positive Airway Pressure Ventilation , pneumonia, resolved, former smoker,  Interstitial lung disease Hx/o pulmonary fibrosis S/P [facility] lung transplant 12/2015   Pulmonary exam normal breath sounds clear to auscultation       Cardiovascular hypertension, Pt. on medications Normal cardiovascular exam Rhythm:Regular Rate:Normal     Neuro/Psych Glaucoma  Neuromuscular disease negative psych ROS   GI/Hepatic Neg liver ROS, GERD  Medicated and Controlled,  Endo/Other  diabetes, Well Controlled, Type 2, Insulin Dependent  Renal/GU Dialysis and ESRFRenal diseaseLast dialysis yesterday  negative genitourinary   Musculoskeletal  (+) Arthritis , Osteoarthritis,    Abdominal   Peds  Hematology Immune suppressed   Anesthesia Other Findings AV fistula left upper arm  Reproductive/Obstetrics                           Anesthesia Physical Anesthesia Plan  ASA: IV  Anesthesia Plan: MAC   Post-op Pain Management:    Induction: Intravenous  PONV Risk Score and Plan: 1 and Ondansetron and Treatment may vary due to age or medical condition  Airway Management Planned: Natural Airway and Simple Face Mask  Additional Equipment:   Intra-op Plan:   Post-operative Plan:   Informed Consent: I have reviewed the patients History and Physical, chart, labs and discussed the procedure including the risks, benefits and alternatives for the proposed anesthesia with the patient or authorized representative who has indicated his/her understanding and  acceptance.   Dental advisory given  Plan Discussed with: CRNA  Anesthesia Plan Comments:        Anesthesia Quick Evaluation

## 2017-11-07 NOTE — H&P (Addendum)
[name] is an 67 y.o. male.    Chief Complaint: Surveillance colonoscopy for colonic polyp  HPI: In 2014 he had a transverse polyp removed. He has history of lung transplant and has ESRD on HD(M/W/F). ASA 81 mg , last dose 24 hours ago, there is no family history of colon cancer.  Past Medical History:  Diagnosis Date  . Aortic valve disorders   . Benign neoplasm of colon   . Degeneration of intervertebral disc, site unspecified   . Diabetes mellitus without complication ([place])   . 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 Esophageal reflux   . ESRD (end stage renal disease) on dialysis ([place])   . Essential hypertension   . Obstructive sleep apnea (adult) (pediatric)   . Osteoarthrosis, unspecified whether generalized or localized, unspecified site   . Other and unspecified hyperlipidemia   . Pneumonia    interstitial pneumonia  . Pulmonary fibrosis ([place])   . Renal disorder   . Respiratory failure with hypoxia ([place]) 12/2015  . Shortness of breath dyspnea   . Transplanted, lung ([place])   . Unspecified essential hypertension     Past Surgical History:  Procedure Laterality Date  . LUNG BIOPSY  2010  . LUNG TRANSPLANT, SINGLE Right     Family History  Problem Relation Age of Onset  . Pancreatic cancer Brother   . Heart disease Father    Social History:  reports that he quit smoking about 15 years ago. His smoking use included cigarettes. He has a 6.00 pack-year smoking history. He has never used smokeless tobacco. He reports that he does not drink alcohol or use drugs.  Allergies:  Allergies  Allergen Reactions  . Levofloxacin Other (See Comments)    LOSS OF CONSCIOUSNESS  . Nsaids Other (See Comments)    Patient instructed not to take NSAID's after his lung transplant    Medications Prior to Admission  Medication Sig Dispense Refill  . acetaminophen (TYLENOL) 500 MG tablet Take 500-1,000 mg by mouth every 6 (six) hours as needed for  headache.    Marland Kitchen aspirin 81 MG tablet Take 81 mg by mouth daily.     Marland Kitchen azaTHIOprine (IMURAN) 50 MG tablet Take 1/2 tablet (25 mg) every Monday, Wednesday and Friday after HD.    . calcium acetate (PHOSLO) 667 MG capsule Take 1,334 mg by mouth 3 (three) times daily before meals.     . cycloSPORINE modified (NEORAL) 25 MG capsule Take 75 mg by mouth 2 (two) times daily.     . cycloSPORINE modified (NEORAL) 25 MG capsule Take by mouth.    . hydrOXYzine (ATARAX/VISTARIL) 25 MG tablet Take by mouth.    . metoCLOPramide (REGLAN) 5 MG tablet Take by mouth.    . midodrine (PROAMATINE) 10 MG tablet Take by mouth.    . Multiple Vitamin (MULTIVITAMIN WITH MINERALS) TABS tablet Take 1 tablet by mouth daily.    Marland Kitchen omeprazole (PRILOSEC) 20 MG capsule Take by mouth.    . pravastatin (PRAVACHOL) 20 MG tablet Take 20 mg by mouth every evening.     . predniSONE (DELTASONE) 5 MG tablet Take by mouth.    . sertraline (ZOLOFT) 50 MG tablet Take 50 mg by mouth daily.    Marland Kitchen ACCU-CHEK FASTCLIX LANCETS MISC As directed up to 4 times daily  3  . doxercalciferol (HECTOROL) 4 MCG/2ML injection Inject into the vein.    . fluticasone (FLONASE) 50 MCG/ACT nasal spray Place 2 sprays into the  nose daily. (Patient taking differently: Place 2 sprays into the nose daily as needed for allergies. ) 30 g 2  . insulin regular (NOVOLIN R) 100 units/mL injection Inject 5 Units into the skin 3 (three) times daily before meals. Additional units (per sliding scale): BGL 201-250 = 1 unit; 251-300 = 2 units; 301-350 = 3 units; 351-400 = units; >401 = 5 units + CALL LUNG COORDINATOR @ [facility]    . insulin regular (NOVOLIN R,HUMULIN R) 100 units/mL injection 201 - 250 mg/dL 1 units, 251 - 300 mg/dL 2 units, 301 - 350 mg/dL 3 units, >350 mg/dL Notify Provider    . latanoprost (XALATAN) 0.005 % ophthalmic solution Place 1 drop into both eyes at bedtime.    . ondansetron (ZOFRAN) 4 MG tablet Take 4 mg by mouth every 8 (eight) hours as needed for nausea  or vomiting.     . polyvinyl alcohol-povidone (REFRESH) 1.4-0.6 % ophthalmic solution Place 1-2 drops into both eyes daily as needed (for dryness).    Marland Kitchen sulfamethoxazole-trimethoprim (BACTRIM,SEPTRA) 400-80 MG tablet Take 1 tablet by mouth 3 (three) times a week. Monday, Wednesday, Friday    . valGANciclovir (VALCYTE) 450 MG tablet Take 1 tablet by mouth See admin instructions. Every Monday and Friday AFTER DIALYSIS      No results found for this or any previous visit (from the past 48 hour(s)). No results found.     Physical Exam  Alert, awake No abdominal distension Regular breathing Normal Heart rhythm No deformity   Assessment/Plan Colonic polyp removed in 2014, needs a surveillance colonoscopy.  Ronnette Juniper, MD 11/07/2017, 12:35 PM

## 2017-11-07 NOTE — Brief Op Note (Signed)
11/07/2017  1:21 PM  PATIENT:  [name]  67 y.o. male  PRE-OPERATIVE DIAGNOSIS:  personal history of colon polyps  POST-OPERATIVE DIAGNOSIS:  ascending colon polyp, transverse colon polyp, descending polyp biopsies, diverticulosis, hemorrhoids  PROCEDURE:  Procedure(s): COLONOSCOPY WITH PROPOFOL (N/A)  SURGEON:  Surgeon(s) and Role:    Ronnette Juniper, MD - Primary  PHYSICIAN ASSISTANT:   ASSISTANTS:Autumn Sheppard Plumber, RN,  Janie Billups, Tech  ANESTHESIA:   MAC  EBL:  minimal   BLOOD ADMINISTERED:none  DRAINS: none   LOCAL MEDICATIONS USED:  NONE  SPECIMEN:  Biopsy / Limited Resection  DISPOSITION OF SPECIMEN:  PATHOLOGY  COUNTS:  YES  TOURNIQUET:  * No tourniquets in log *  DICTATION: .Dragon Dictation  PLAN OF CARE: Discharge to home after PACU  PATIENT DISPOSITION:  PACU - hemodynamically stable.   Delay start of Pharmacological VTE agent (>24hrs) due to surgical blood loss or risk of bleeding: no

## 2017-11-07 NOTE — Op Note (Signed)
[facility] Patient Name: [name] Procedure Date: 11/07/2017 MRN: [id number] Attending MD: Ronnette Juniper , MD Date of Birth: 30-Oct-1950 CSN: [id number] Age: 67 Admit Type: Inpatient Procedure:                Colonoscopy Indications:              High risk colon cancer surveillance: Personal                            history of colonic polyps, Last colonoscopy: 2014 Providers:                Ronnette Juniper, MD, Cherylynn Ridges, Technician, Rosario Adie, CRNA Referring MD:              Medicines:                Monitored Anesthesia Care Complications:            No immediate complications. Estimated Blood Loss:     Estimated blood loss: none. Procedure:                Pre-Anesthesia Assessment:                           - Prior to the procedure, a History and Physical                            was performed, and patient medications and                            allergies were reviewed. The patient's tolerance of                            previous anesthesia was also reviewed. The risks                            and benefits of the procedure and the sedation                            options and risks were discussed with the patient.                            All questions were answered, and informed consent                            was obtained. Prior Anticoagulants: The patient has                            taken aspirin, last dose was 1 day prior to                            procedure. ASA Grade Assessment: III - A patient  with severe systemic disease. After reviewing the                            risks and benefits, the patient was deemed in                            satisfactory condition to undergo the procedure.                           After obtaining informed consent, the colonoscope                            was passed under direct vision. Throughout the   procedure, the patient's blood pressure, pulse, and                            oxygen saturations were monitored continuously. The                            EC-[id number] ([id number]) scope was introduced through                            the anus and advanced to the the cecum, identified                            by appendiceal orifice and ileocecal valve. The                            colonoscopy was performed without difficulty. The                            patient tolerated the procedure well. The quality                            of the bowel preparation was adequate to identify                            polyps 6 mm and larger in size. Scope In: 12:59:09 PM Scope Out: 1:18:33 PM Scope Withdrawal Time: 0 hours 11 minutes 38 seconds  Total Procedure Duration: 0 hours 19 minutes 24 seconds  Findings:      The perianal and digital rectal examinations were normal.      Three sessile polyps were found in the descending colon, transverse       colon and ascending colon. The polyps were 5 to 8 mm in size. The larger       polyp was removed with a hot snare,smaller ones were removed with cold       forceps. Resection and retrieval were complete.      Two small localized angiodysplastic lesions without bleeding were found       in the ascending colon. The area was cauterized with tip of hot snare.      Multiple small-mouthed diverticula were found in the sigmoid colon and       descending colon.      The retroflexed view of the distal rectum and anal  verge was normal and       showed no anal or rectal abnormalities.      The exam was otherwise without abnormality. Impression:               - Three 5 to 8 mm polyps in the descending colon,                            in the transverse colon and in the ascending colon,                            removed with a hot snare. Resected and retrieved.                           - Two non-bleeding colonic angiodysplastic lesions.                            - Diverticulosis in the sigmoid colon and in the                            descending colon.                           - The distal rectum and anal verge are normal on                            retroflexion view.                           - The examination was otherwise normal. Moderate Sedation:      Patient did not receive moderate sedation for this procedure, but       instead received monitored anesthesia care. Recommendation:           - Patient has a contact number available for                            emergencies. The signs and symptoms of potential                            delayed complications were discussed with the                            patient. Return to normal activities tomorrow.                            Written discharge instructions were provided to the                            patient.                           - Resume regular diet.                           - Continue present medications.                           -  Await pathology results.                           - Repeat colonoscopy in 3 - 5 years for                            surveillance based on pathology results. Procedure Code(s):        --- Professional ---                           [phone], Colonoscopy, flexible; with removal of                            tumor(s), polyp(s), or other lesion(s) by snare                            technique Diagnosis Code(s):        --- Professional ---                           Z86.010, Personal history of colonic polyps                           D12.4, Benign neoplasm of descending colon                           D12.3, Benign neoplasm of transverse colon (hepatic                            flexure or splenic flexure)                           D12.2, Benign neoplasm of ascending colon                           K55.20, Angiodysplasia of colon without hemorrhage                           K57.30, Diverticulosis of large intestine without                             perforation or abscess without bleeding CPT copyright 2017 [employer]. All rights reserved. The codes documented in this report are preliminary and upon coder review may  be revised to meet current compliance requirements. Ronnette Juniper, MD 11/07/2017 1:26:00 PM This report has been signed electronically. Number of Addenda: 0

## 2017-11-07 NOTE — Transfer of Care (Signed)
Immediate Anesthesia Transfer of Care Note  Patient: [name]  Procedure(s) Performed: COLONOSCOPY WITH PROPOFOL (N/A )  Patient Location: PACU  Anesthesia Type:MAC  Level of Consciousness: awake, alert  and oriented  Airway & Oxygen Therapy: Patient Spontanous Breathing and Patient connected to face mask oxygen  Post-op Assessment: Report given to RN and Post -op Vital signs reviewed and stable  Post vital signs: Reviewed and stable  Last Vitals:  Vitals Value Taken Time  BP 164/70 11/07/2017  1:35 PM  Temp    Pulse 89 11/07/2017  1:35 PM  Resp 21 11/07/2017  1:35 PM  SpO2 100 % 11/07/2017  1:35 PM    Last Pain:  Vitals:   11/07/17 1335  TempSrc:   PainSc: 0-No pain         Complications: No apparent anesthesia complications

## 2017-11-07 NOTE — Anesthesia Postprocedure Evaluation (Signed)
Anesthesia Post Note  Patient: EZERIAH LUTY  Procedure(s) Performed: COLONOSCOPY WITH PROPOFOL (N/A )     Patient location during evaluation: PACU Anesthesia Type: MAC Level of consciousness: awake and alert and oriented Pain management: pain level controlled Vital Signs Assessment: post-procedure vital signs reviewed and stable Respiratory status: spontaneous breathing, nonlabored ventilation and respiratory function stable Cardiovascular status: stable and blood pressure returned to baseline Postop Assessment: no apparent nausea or vomiting Anesthetic complications: no    Last Vitals:  Vitals:   11/07/17 1335 11/07/17 1341  BP: (!) 164/70 (!) 194/87  Pulse: 89 80  Resp: (!) 21 17  Temp:    SpO2: 100% 98%    Last Pain:  Vitals:   11/07/17 1341  TempSrc:   PainSc: 0-No pain                 Labella Zahradnik A.

## 2017-11-07 NOTE — Discharge Instructions (Signed)

## 2017-11-08 DIAGNOSIS — E876 Hypokalemia: Secondary | ICD-10-CM | POA: Diagnosis not present

## 2017-11-08 DIAGNOSIS — N2581 Secondary hyperparathyroidism of renal origin: Secondary | ICD-10-CM | POA: Diagnosis not present

## 2017-11-08 DIAGNOSIS — N186 End stage renal disease: Secondary | ICD-10-CM | POA: Diagnosis not present

## 2017-11-08 DIAGNOSIS — D631 Anemia in chronic kidney disease: Secondary | ICD-10-CM | POA: Diagnosis not present

## 2017-11-08 DIAGNOSIS — T8579XA Infection and inflammatory reaction due to other internal prosthetic devices, implants and grafts, initial encounter: Secondary | ICD-10-CM | POA: Diagnosis not present

## 2017-11-09 ENCOUNTER — Encounter (HOSPITAL_COMMUNITY): Payer: Self-pay | Admitting: Gastroenterology

## 2017-11-10 DIAGNOSIS — N186 End stage renal disease: Secondary | ICD-10-CM | POA: Diagnosis not present

## 2017-11-10 DIAGNOSIS — D631 Anemia in chronic kidney disease: Secondary | ICD-10-CM | POA: Diagnosis not present

## 2017-11-10 DIAGNOSIS — N2581 Secondary hyperparathyroidism of renal origin: Secondary | ICD-10-CM | POA: Diagnosis not present

## 2017-11-10 DIAGNOSIS — T8579XA Infection and inflammatory reaction due to other internal prosthetic devices, implants and grafts, initial encounter: Secondary | ICD-10-CM | POA: Diagnosis not present

## 2017-11-10 DIAGNOSIS — E876 Hypokalemia: Secondary | ICD-10-CM | POA: Diagnosis not present

## 2017-11-13 DIAGNOSIS — E876 Hypokalemia: Secondary | ICD-10-CM | POA: Diagnosis not present

## 2017-11-13 DIAGNOSIS — T8579XA Infection and inflammatory reaction due to other internal prosthetic devices, implants and grafts, initial encounter: Secondary | ICD-10-CM | POA: Diagnosis not present

## 2017-11-13 DIAGNOSIS — D631 Anemia in chronic kidney disease: Secondary | ICD-10-CM | POA: Diagnosis not present

## 2017-11-13 DIAGNOSIS — N2581 Secondary hyperparathyroidism of renal origin: Secondary | ICD-10-CM | POA: Diagnosis not present

## 2017-11-13 DIAGNOSIS — N186 End stage renal disease: Secondary | ICD-10-CM | POA: Diagnosis not present

## 2017-11-14 DIAGNOSIS — Z942 Lung transplant status: Secondary | ICD-10-CM | POA: Diagnosis not present

## 2017-11-14 DIAGNOSIS — T86810 Lung transplant rejection: Secondary | ICD-10-CM | POA: Diagnosis not present

## 2017-11-14 DIAGNOSIS — Z992 Dependence on renal dialysis: Secondary | ICD-10-CM | POA: Diagnosis not present

## 2017-11-14 DIAGNOSIS — Z7952 Long term (current) use of systemic steroids: Secondary | ICD-10-CM | POA: Diagnosis not present

## 2017-11-14 DIAGNOSIS — B349 Viral infection, unspecified: Secondary | ICD-10-CM | POA: Diagnosis not present

## 2017-11-14 DIAGNOSIS — B258 Other cytomegaloviral diseases: Secondary | ICD-10-CM | POA: Diagnosis not present

## 2017-11-14 DIAGNOSIS — J8410 Pulmonary fibrosis, unspecified: Secondary | ICD-10-CM | POA: Diagnosis not present

## 2017-11-14 DIAGNOSIS — D899 Disorder involving the immune mechanism, unspecified: Secondary | ICD-10-CM | POA: Diagnosis not present

## 2017-11-14 DIAGNOSIS — I953 Hypotension of hemodialysis: Secondary | ICD-10-CM | POA: Diagnosis not present

## 2017-11-14 DIAGNOSIS — R918 Other nonspecific abnormal finding of lung field: Secondary | ICD-10-CM | POA: Diagnosis not present

## 2017-11-14 DIAGNOSIS — R2681 Unsteadiness on feet: Secondary | ICD-10-CM | POA: Diagnosis not present

## 2017-11-14 DIAGNOSIS — R531 Weakness: Secondary | ICD-10-CM | POA: Diagnosis not present

## 2017-11-14 DIAGNOSIS — R760 Raised antibody titer: Secondary | ICD-10-CM | POA: Diagnosis not present

## 2017-11-15 DIAGNOSIS — N186 End stage renal disease: Secondary | ICD-10-CM | POA: Diagnosis not present

## 2017-11-15 DIAGNOSIS — E876 Hypokalemia: Secondary | ICD-10-CM | POA: Diagnosis not present

## 2017-11-15 DIAGNOSIS — N2581 Secondary hyperparathyroidism of renal origin: Secondary | ICD-10-CM | POA: Diagnosis not present

## 2017-11-15 DIAGNOSIS — T8579XA Infection and inflammatory reaction due to other internal prosthetic devices, implants and grafts, initial encounter: Secondary | ICD-10-CM | POA: Diagnosis not present

## 2017-11-15 DIAGNOSIS — D631 Anemia in chronic kidney disease: Secondary | ICD-10-CM | POA: Diagnosis not present

## 2017-11-17 DIAGNOSIS — N186 End stage renal disease: Secondary | ICD-10-CM | POA: Diagnosis not present

## 2017-11-17 DIAGNOSIS — Z992 Dependence on renal dialysis: Secondary | ICD-10-CM | POA: Diagnosis not present

## 2017-11-20 DIAGNOSIS — N186 End stage renal disease: Secondary | ICD-10-CM | POA: Diagnosis not present

## 2017-11-20 DIAGNOSIS — N2581 Secondary hyperparathyroidism of renal origin: Secondary | ICD-10-CM | POA: Diagnosis not present

## 2017-11-20 DIAGNOSIS — E876 Hypokalemia: Secondary | ICD-10-CM | POA: Diagnosis not present

## 2017-11-20 DIAGNOSIS — T8579XA Infection and inflammatory reaction due to other internal prosthetic devices, implants and grafts, initial encounter: Secondary | ICD-10-CM | POA: Diagnosis not present

## 2017-11-20 DIAGNOSIS — D631 Anemia in chronic kidney disease: Secondary | ICD-10-CM | POA: Diagnosis not present

## 2017-11-22 DIAGNOSIS — N2581 Secondary hyperparathyroidism of renal origin: Secondary | ICD-10-CM | POA: Diagnosis not present

## 2017-11-22 DIAGNOSIS — E876 Hypokalemia: Secondary | ICD-10-CM | POA: Diagnosis not present

## 2017-11-22 DIAGNOSIS — N186 End stage renal disease: Secondary | ICD-10-CM | POA: Diagnosis not present

## 2017-11-22 DIAGNOSIS — D631 Anemia in chronic kidney disease: Secondary | ICD-10-CM | POA: Diagnosis not present

## 2017-11-22 DIAGNOSIS — T8579XA Infection and inflammatory reaction due to other internal prosthetic devices, implants and grafts, initial encounter: Secondary | ICD-10-CM | POA: Diagnosis not present

## 2017-11-23 DIAGNOSIS — Z79899 Other long term (current) drug therapy: Secondary | ICD-10-CM | POA: Diagnosis not present

## 2017-11-23 DIAGNOSIS — Z942 Lung transplant status: Secondary | ICD-10-CM | POA: Diagnosis not present

## 2017-11-23 DIAGNOSIS — Z298 Encounter for other specified prophylactic measures: Secondary | ICD-10-CM | POA: Diagnosis not present

## 2017-11-23 DIAGNOSIS — Z5181 Encounter for therapeutic drug level monitoring: Secondary | ICD-10-CM | POA: Diagnosis not present

## 2017-11-23 DIAGNOSIS — T86819 Unspecified complication of lung transplant: Secondary | ICD-10-CM | POA: Diagnosis not present

## 2017-11-24 DIAGNOSIS — T8579XA Infection and inflammatory reaction due to other internal prosthetic devices, implants and grafts, initial encounter: Secondary | ICD-10-CM | POA: Diagnosis not present

## 2017-11-24 DIAGNOSIS — D631 Anemia in chronic kidney disease: Secondary | ICD-10-CM | POA: Diagnosis not present

## 2017-11-24 DIAGNOSIS — E876 Hypokalemia: Secondary | ICD-10-CM | POA: Diagnosis not present

## 2017-11-24 DIAGNOSIS — N186 End stage renal disease: Secondary | ICD-10-CM | POA: Diagnosis not present

## 2017-11-24 DIAGNOSIS — N2581 Secondary hyperparathyroidism of renal origin: Secondary | ICD-10-CM | POA: Diagnosis not present

## 2017-11-27 DIAGNOSIS — E876 Hypokalemia: Secondary | ICD-10-CM | POA: Diagnosis not present

## 2017-11-27 DIAGNOSIS — T8579XA Infection and inflammatory reaction due to other internal prosthetic devices, implants and grafts, initial encounter: Secondary | ICD-10-CM | POA: Diagnosis not present

## 2017-11-27 DIAGNOSIS — N2581 Secondary hyperparathyroidism of renal origin: Secondary | ICD-10-CM | POA: Diagnosis not present

## 2017-11-27 DIAGNOSIS — D631 Anemia in chronic kidney disease: Secondary | ICD-10-CM | POA: Diagnosis not present

## 2017-11-27 DIAGNOSIS — N186 End stage renal disease: Secondary | ICD-10-CM | POA: Diagnosis not present

## 2017-11-28 DIAGNOSIS — K5900 Constipation, unspecified: Secondary | ICD-10-CM | POA: Diagnosis not present

## 2017-11-28 DIAGNOSIS — R270 Ataxia, unspecified: Secondary | ICD-10-CM | POA: Diagnosis not present

## 2017-11-28 DIAGNOSIS — K229 Disease of esophagus, unspecified: Secondary | ICD-10-CM | POA: Diagnosis not present

## 2017-11-29 DIAGNOSIS — D631 Anemia in chronic kidney disease: Secondary | ICD-10-CM | POA: Diagnosis not present

## 2017-11-29 DIAGNOSIS — E876 Hypokalemia: Secondary | ICD-10-CM | POA: Diagnosis not present

## 2017-11-29 DIAGNOSIS — N186 End stage renal disease: Secondary | ICD-10-CM | POA: Diagnosis not present

## 2017-11-29 DIAGNOSIS — N2581 Secondary hyperparathyroidism of renal origin: Secondary | ICD-10-CM | POA: Diagnosis not present

## 2017-11-29 DIAGNOSIS — T8579XA Infection and inflammatory reaction due to other internal prosthetic devices, implants and grafts, initial encounter: Secondary | ICD-10-CM | POA: Diagnosis not present

## 2017-11-30 DIAGNOSIS — T86819 Unspecified complication of lung transplant: Secondary | ICD-10-CM | POA: Diagnosis not present

## 2017-11-30 DIAGNOSIS — Z5181 Encounter for therapeutic drug level monitoring: Secondary | ICD-10-CM | POA: Diagnosis not present

## 2017-11-30 DIAGNOSIS — Z298 Encounter for other specified prophylactic measures: Secondary | ICD-10-CM | POA: Diagnosis not present

## 2017-11-30 DIAGNOSIS — Z79899 Other long term (current) drug therapy: Secondary | ICD-10-CM | POA: Diagnosis not present

## 2017-11-30 DIAGNOSIS — Z942 Lung transplant status: Secondary | ICD-10-CM | POA: Diagnosis not present

## 2017-12-01 DIAGNOSIS — N186 End stage renal disease: Secondary | ICD-10-CM | POA: Diagnosis not present

## 2017-12-01 DIAGNOSIS — T8579XA Infection and inflammatory reaction due to other internal prosthetic devices, implants and grafts, initial encounter: Secondary | ICD-10-CM | POA: Diagnosis not present

## 2017-12-01 DIAGNOSIS — N2581 Secondary hyperparathyroidism of renal origin: Secondary | ICD-10-CM | POA: Diagnosis not present

## 2017-12-01 DIAGNOSIS — E876 Hypokalemia: Secondary | ICD-10-CM | POA: Diagnosis not present

## 2017-12-01 DIAGNOSIS — D631 Anemia in chronic kidney disease: Secondary | ICD-10-CM | POA: Diagnosis not present

## 2017-12-04 DIAGNOSIS — N186 End stage renal disease: Secondary | ICD-10-CM | POA: Diagnosis not present

## 2017-12-04 DIAGNOSIS — T8579XA Infection and inflammatory reaction due to other internal prosthetic devices, implants and grafts, initial encounter: Secondary | ICD-10-CM | POA: Diagnosis not present

## 2017-12-04 DIAGNOSIS — Z992 Dependence on renal dialysis: Secondary | ICD-10-CM | POA: Diagnosis not present

## 2017-12-04 DIAGNOSIS — N2581 Secondary hyperparathyroidism of renal origin: Secondary | ICD-10-CM | POA: Diagnosis not present

## 2017-12-04 DIAGNOSIS — T86819 Unspecified complication of lung transplant: Secondary | ICD-10-CM | POA: Diagnosis not present

## 2017-12-04 DIAGNOSIS — E876 Hypokalemia: Secondary | ICD-10-CM | POA: Diagnosis not present

## 2017-12-04 DIAGNOSIS — D631 Anemia in chronic kidney disease: Secondary | ICD-10-CM | POA: Diagnosis not present

## 2017-12-05 DIAGNOSIS — H6123 Impacted cerumen, bilateral: Secondary | ICD-10-CM | POA: Diagnosis not present

## 2017-12-06 DIAGNOSIS — D631 Anemia in chronic kidney disease: Secondary | ICD-10-CM | POA: Diagnosis not present

## 2017-12-06 DIAGNOSIS — N186 End stage renal disease: Secondary | ICD-10-CM | POA: Diagnosis not present

## 2017-12-06 DIAGNOSIS — E876 Hypokalemia: Secondary | ICD-10-CM | POA: Diagnosis not present

## 2017-12-06 DIAGNOSIS — T8579XA Infection and inflammatory reaction due to other internal prosthetic devices, implants and grafts, initial encounter: Secondary | ICD-10-CM | POA: Diagnosis not present

## 2017-12-06 DIAGNOSIS — N2581 Secondary hyperparathyroidism of renal origin: Secondary | ICD-10-CM | POA: Diagnosis not present

## 2017-12-08 DIAGNOSIS — E876 Hypokalemia: Secondary | ICD-10-CM | POA: Diagnosis not present

## 2017-12-08 DIAGNOSIS — N2581 Secondary hyperparathyroidism of renal origin: Secondary | ICD-10-CM | POA: Diagnosis not present

## 2017-12-08 DIAGNOSIS — T8579XA Infection and inflammatory reaction due to other internal prosthetic devices, implants and grafts, initial encounter: Secondary | ICD-10-CM | POA: Diagnosis not present

## 2017-12-08 DIAGNOSIS — D631 Anemia in chronic kidney disease: Secondary | ICD-10-CM | POA: Diagnosis not present

## 2017-12-08 DIAGNOSIS — N186 End stage renal disease: Secondary | ICD-10-CM | POA: Diagnosis not present

## 2017-12-11 DIAGNOSIS — T8579XA Infection and inflammatory reaction due to other internal prosthetic devices, implants and grafts, initial encounter: Secondary | ICD-10-CM | POA: Diagnosis not present

## 2017-12-11 DIAGNOSIS — N2581 Secondary hyperparathyroidism of renal origin: Secondary | ICD-10-CM | POA: Diagnosis not present

## 2017-12-11 DIAGNOSIS — N186 End stage renal disease: Secondary | ICD-10-CM | POA: Diagnosis not present

## 2017-12-11 DIAGNOSIS — D631 Anemia in chronic kidney disease: Secondary | ICD-10-CM | POA: Diagnosis not present

## 2017-12-11 DIAGNOSIS — E876 Hypokalemia: Secondary | ICD-10-CM | POA: Diagnosis not present

## 2017-12-12 DIAGNOSIS — M79671 Pain in right foot: Secondary | ICD-10-CM | POA: Diagnosis not present

## 2017-12-12 DIAGNOSIS — M79604 Pain in right leg: Secondary | ICD-10-CM | POA: Diagnosis not present

## 2017-12-12 DIAGNOSIS — M79672 Pain in left foot: Secondary | ICD-10-CM | POA: Diagnosis not present

## 2017-12-12 DIAGNOSIS — M79605 Pain in left leg: Secondary | ICD-10-CM | POA: Diagnosis not present

## 2017-12-13 DIAGNOSIS — T8579XA Infection and inflammatory reaction due to other internal prosthetic devices, implants and grafts, initial encounter: Secondary | ICD-10-CM | POA: Diagnosis not present

## 2017-12-13 DIAGNOSIS — E876 Hypokalemia: Secondary | ICD-10-CM | POA: Diagnosis not present

## 2017-12-13 DIAGNOSIS — D631 Anemia in chronic kidney disease: Secondary | ICD-10-CM | POA: Diagnosis not present

## 2017-12-13 DIAGNOSIS — N2581 Secondary hyperparathyroidism of renal origin: Secondary | ICD-10-CM | POA: Diagnosis not present

## 2017-12-13 DIAGNOSIS — N186 End stage renal disease: Secondary | ICD-10-CM | POA: Diagnosis not present

## 2017-12-15 DIAGNOSIS — T8579XA Infection and inflammatory reaction due to other internal prosthetic devices, implants and grafts, initial encounter: Secondary | ICD-10-CM | POA: Diagnosis not present

## 2017-12-15 DIAGNOSIS — D631 Anemia in chronic kidney disease: Secondary | ICD-10-CM | POA: Diagnosis not present

## 2017-12-15 DIAGNOSIS — N2581 Secondary hyperparathyroidism of renal origin: Secondary | ICD-10-CM | POA: Diagnosis not present

## 2017-12-15 DIAGNOSIS — N186 End stage renal disease: Secondary | ICD-10-CM | POA: Diagnosis not present

## 2017-12-15 DIAGNOSIS — E876 Hypokalemia: Secondary | ICD-10-CM | POA: Diagnosis not present

## 2017-12-18 DIAGNOSIS — N2581 Secondary hyperparathyroidism of renal origin: Secondary | ICD-10-CM | POA: Diagnosis not present

## 2017-12-18 DIAGNOSIS — T8579XA Infection and inflammatory reaction due to other internal prosthetic devices, implants and grafts, initial encounter: Secondary | ICD-10-CM | POA: Diagnosis not present

## 2017-12-18 DIAGNOSIS — E876 Hypokalemia: Secondary | ICD-10-CM | POA: Diagnosis not present

## 2017-12-18 DIAGNOSIS — D631 Anemia in chronic kidney disease: Secondary | ICD-10-CM | POA: Diagnosis not present

## 2017-12-18 DIAGNOSIS — N186 End stage renal disease: Secondary | ICD-10-CM | POA: Diagnosis not present

## 2017-12-20 DIAGNOSIS — D631 Anemia in chronic kidney disease: Secondary | ICD-10-CM | POA: Diagnosis not present

## 2017-12-20 DIAGNOSIS — E876 Hypokalemia: Secondary | ICD-10-CM | POA: Diagnosis not present

## 2017-12-20 DIAGNOSIS — T8579XA Infection and inflammatory reaction due to other internal prosthetic devices, implants and grafts, initial encounter: Secondary | ICD-10-CM | POA: Diagnosis not present

## 2017-12-20 DIAGNOSIS — N186 End stage renal disease: Secondary | ICD-10-CM | POA: Diagnosis not present

## 2017-12-20 DIAGNOSIS — N2581 Secondary hyperparathyroidism of renal origin: Secondary | ICD-10-CM | POA: Diagnosis not present

## 2017-12-22 DIAGNOSIS — E876 Hypokalemia: Secondary | ICD-10-CM | POA: Diagnosis not present

## 2017-12-22 DIAGNOSIS — N2581 Secondary hyperparathyroidism of renal origin: Secondary | ICD-10-CM | POA: Diagnosis not present

## 2017-12-22 DIAGNOSIS — D631 Anemia in chronic kidney disease: Secondary | ICD-10-CM | POA: Diagnosis not present

## 2017-12-22 DIAGNOSIS — N186 End stage renal disease: Secondary | ICD-10-CM | POA: Diagnosis not present

## 2017-12-22 DIAGNOSIS — T8579XA Infection and inflammatory reaction due to other internal prosthetic devices, implants and grafts, initial encounter: Secondary | ICD-10-CM | POA: Diagnosis not present

## 2017-12-25 DIAGNOSIS — T8579XA Infection and inflammatory reaction due to other internal prosthetic devices, implants and grafts, initial encounter: Secondary | ICD-10-CM | POA: Diagnosis not present

## 2017-12-25 DIAGNOSIS — N2581 Secondary hyperparathyroidism of renal origin: Secondary | ICD-10-CM | POA: Diagnosis not present

## 2017-12-25 DIAGNOSIS — D631 Anemia in chronic kidney disease: Secondary | ICD-10-CM | POA: Diagnosis not present

## 2017-12-25 DIAGNOSIS — E876 Hypokalemia: Secondary | ICD-10-CM | POA: Diagnosis not present

## 2017-12-25 DIAGNOSIS — N186 End stage renal disease: Secondary | ICD-10-CM | POA: Diagnosis not present

## 2017-12-26 DIAGNOSIS — K229 Disease of esophagus, unspecified: Secondary | ICD-10-CM | POA: Diagnosis not present

## 2017-12-26 DIAGNOSIS — K5900 Constipation, unspecified: Secondary | ICD-10-CM | POA: Diagnosis not present

## 2017-12-27 DIAGNOSIS — D631 Anemia in chronic kidney disease: Secondary | ICD-10-CM | POA: Diagnosis not present

## 2017-12-27 DIAGNOSIS — E876 Hypokalemia: Secondary | ICD-10-CM | POA: Diagnosis not present

## 2017-12-27 DIAGNOSIS — N186 End stage renal disease: Secondary | ICD-10-CM | POA: Diagnosis not present

## 2017-12-27 DIAGNOSIS — N2581 Secondary hyperparathyroidism of renal origin: Secondary | ICD-10-CM | POA: Diagnosis not present

## 2017-12-27 DIAGNOSIS — T8579XA Infection and inflammatory reaction due to other internal prosthetic devices, implants and grafts, initial encounter: Secondary | ICD-10-CM | POA: Diagnosis not present

## 2017-12-30 DIAGNOSIS — N186 End stage renal disease: Secondary | ICD-10-CM | POA: Diagnosis not present

## 2017-12-30 DIAGNOSIS — D631 Anemia in chronic kidney disease: Secondary | ICD-10-CM | POA: Diagnosis not present

## 2017-12-30 DIAGNOSIS — N2581 Secondary hyperparathyroidism of renal origin: Secondary | ICD-10-CM | POA: Diagnosis not present

## 2017-12-30 DIAGNOSIS — E876 Hypokalemia: Secondary | ICD-10-CM | POA: Diagnosis not present

## 2017-12-30 DIAGNOSIS — T8579XA Infection and inflammatory reaction due to other internal prosthetic devices, implants and grafts, initial encounter: Secondary | ICD-10-CM | POA: Diagnosis not present

## 2018-01-01 DIAGNOSIS — N186 End stage renal disease: Secondary | ICD-10-CM | POA: Diagnosis not present

## 2018-01-01 DIAGNOSIS — E876 Hypokalemia: Secondary | ICD-10-CM | POA: Diagnosis not present

## 2018-01-01 DIAGNOSIS — T8579XA Infection and inflammatory reaction due to other internal prosthetic devices, implants and grafts, initial encounter: Secondary | ICD-10-CM | POA: Diagnosis not present

## 2018-01-01 DIAGNOSIS — D631 Anemia in chronic kidney disease: Secondary | ICD-10-CM | POA: Diagnosis not present

## 2018-01-01 DIAGNOSIS — N2581 Secondary hyperparathyroidism of renal origin: Secondary | ICD-10-CM | POA: Diagnosis not present

## 2018-01-03 DIAGNOSIS — N186 End stage renal disease: Secondary | ICD-10-CM | POA: Diagnosis not present

## 2018-01-03 DIAGNOSIS — D631 Anemia in chronic kidney disease: Secondary | ICD-10-CM | POA: Diagnosis not present

## 2018-01-03 DIAGNOSIS — E876 Hypokalemia: Secondary | ICD-10-CM | POA: Diagnosis not present

## 2018-01-03 DIAGNOSIS — N2581 Secondary hyperparathyroidism of renal origin: Secondary | ICD-10-CM | POA: Diagnosis not present

## 2018-01-03 DIAGNOSIS — T8579XA Infection and inflammatory reaction due to other internal prosthetic devices, implants and grafts, initial encounter: Secondary | ICD-10-CM | POA: Diagnosis not present

## 2018-01-04 DIAGNOSIS — N186 End stage renal disease: Secondary | ICD-10-CM | POA: Diagnosis not present

## 2018-01-04 DIAGNOSIS — Z992 Dependence on renal dialysis: Secondary | ICD-10-CM | POA: Diagnosis not present

## 2018-01-04 DIAGNOSIS — T86819 Unspecified complication of lung transplant: Secondary | ICD-10-CM | POA: Diagnosis not present

## 2018-01-05 DIAGNOSIS — N2581 Secondary hyperparathyroidism of renal origin: Secondary | ICD-10-CM | POA: Diagnosis not present

## 2018-01-05 DIAGNOSIS — N186 End stage renal disease: Secondary | ICD-10-CM | POA: Diagnosis not present

## 2018-01-05 DIAGNOSIS — E876 Hypokalemia: Secondary | ICD-10-CM | POA: Diagnosis not present

## 2018-01-05 DIAGNOSIS — E875 Hyperkalemia: Secondary | ICD-10-CM | POA: Diagnosis not present

## 2018-01-05 DIAGNOSIS — D631 Anemia in chronic kidney disease: Secondary | ICD-10-CM | POA: Diagnosis not present

## 2018-01-05 DIAGNOSIS — T8579XA Infection and inflammatory reaction due to other internal prosthetic devices, implants and grafts, initial encounter: Secondary | ICD-10-CM | POA: Diagnosis not present

## 2018-01-08 DIAGNOSIS — D631 Anemia in chronic kidney disease: Secondary | ICD-10-CM | POA: Diagnosis not present

## 2018-01-08 DIAGNOSIS — E876 Hypokalemia: Secondary | ICD-10-CM | POA: Diagnosis not present

## 2018-01-08 DIAGNOSIS — N186 End stage renal disease: Secondary | ICD-10-CM | POA: Diagnosis not present

## 2018-01-08 DIAGNOSIS — N2581 Secondary hyperparathyroidism of renal origin: Secondary | ICD-10-CM | POA: Diagnosis not present

## 2018-01-08 DIAGNOSIS — T8579XA Infection and inflammatory reaction due to other internal prosthetic devices, implants and grafts, initial encounter: Secondary | ICD-10-CM | POA: Diagnosis not present

## 2018-01-08 DIAGNOSIS — E875 Hyperkalemia: Secondary | ICD-10-CM | POA: Diagnosis not present

## 2018-01-10 DIAGNOSIS — N2581 Secondary hyperparathyroidism of renal origin: Secondary | ICD-10-CM | POA: Diagnosis not present

## 2018-01-10 DIAGNOSIS — E875 Hyperkalemia: Secondary | ICD-10-CM | POA: Diagnosis not present

## 2018-01-10 DIAGNOSIS — E876 Hypokalemia: Secondary | ICD-10-CM | POA: Diagnosis not present

## 2018-01-10 DIAGNOSIS — D631 Anemia in chronic kidney disease: Secondary | ICD-10-CM | POA: Diagnosis not present

## 2018-01-10 DIAGNOSIS — T8579XA Infection and inflammatory reaction due to other internal prosthetic devices, implants and grafts, initial encounter: Secondary | ICD-10-CM | POA: Diagnosis not present

## 2018-01-10 DIAGNOSIS — N186 End stage renal disease: Secondary | ICD-10-CM | POA: Diagnosis not present

## 2018-01-11 DIAGNOSIS — I10 Essential (primary) hypertension: Secondary | ICD-10-CM | POA: Diagnosis not present

## 2018-01-11 DIAGNOSIS — Z942 Lung transplant status: Secondary | ICD-10-CM | POA: Diagnosis not present

## 2018-01-11 DIAGNOSIS — Z298 Encounter for other specified prophylactic measures: Secondary | ICD-10-CM | POA: Diagnosis not present

## 2018-01-11 DIAGNOSIS — N186 End stage renal disease: Secondary | ICD-10-CM | POA: Diagnosis not present

## 2018-01-11 DIAGNOSIS — T86819 Unspecified complication of lung transplant: Secondary | ICD-10-CM | POA: Diagnosis not present

## 2018-01-11 DIAGNOSIS — M549 Dorsalgia, unspecified: Secondary | ICD-10-CM | POA: Diagnosis not present

## 2018-01-11 DIAGNOSIS — Z79899 Other long term (current) drug therapy: Secondary | ICD-10-CM | POA: Diagnosis not present

## 2018-01-11 DIAGNOSIS — I270 Primary pulmonary hypertension: Secondary | ICD-10-CM | POA: Diagnosis not present

## 2018-01-11 DIAGNOSIS — E782 Mixed hyperlipidemia: Secondary | ICD-10-CM | POA: Diagnosis not present

## 2018-01-11 DIAGNOSIS — Z5181 Encounter for therapeutic drug level monitoring: Secondary | ICD-10-CM | POA: Diagnosis not present

## 2018-01-11 DIAGNOSIS — Z992 Dependence on renal dialysis: Secondary | ICD-10-CM | POA: Diagnosis not present

## 2018-01-11 DIAGNOSIS — I739 Peripheral vascular disease, unspecified: Secondary | ICD-10-CM | POA: Diagnosis not present

## 2018-01-11 DIAGNOSIS — E1122 Type 2 diabetes mellitus with diabetic chronic kidney disease: Secondary | ICD-10-CM | POA: Diagnosis not present

## 2018-01-11 DIAGNOSIS — J849 Interstitial pulmonary disease, unspecified: Secondary | ICD-10-CM | POA: Diagnosis not present

## 2018-01-12 DIAGNOSIS — E876 Hypokalemia: Secondary | ICD-10-CM | POA: Diagnosis not present

## 2018-01-12 DIAGNOSIS — D631 Anemia in chronic kidney disease: Secondary | ICD-10-CM | POA: Diagnosis not present

## 2018-01-12 DIAGNOSIS — E875 Hyperkalemia: Secondary | ICD-10-CM | POA: Diagnosis not present

## 2018-01-12 DIAGNOSIS — N186 End stage renal disease: Secondary | ICD-10-CM | POA: Diagnosis not present

## 2018-01-12 DIAGNOSIS — T8579XA Infection and inflammatory reaction due to other internal prosthetic devices, implants and grafts, initial encounter: Secondary | ICD-10-CM | POA: Diagnosis not present

## 2018-01-12 DIAGNOSIS — N2581 Secondary hyperparathyroidism of renal origin: Secondary | ICD-10-CM | POA: Diagnosis not present

## 2018-01-15 DIAGNOSIS — R270 Ataxia, unspecified: Secondary | ICD-10-CM | POA: Diagnosis not present

## 2018-01-15 DIAGNOSIS — M79672 Pain in left foot: Secondary | ICD-10-CM | POA: Diagnosis not present

## 2018-01-15 DIAGNOSIS — D631 Anemia in chronic kidney disease: Secondary | ICD-10-CM | POA: Diagnosis not present

## 2018-01-15 DIAGNOSIS — M79604 Pain in right leg: Secondary | ICD-10-CM | POA: Diagnosis not present

## 2018-01-15 DIAGNOSIS — E875 Hyperkalemia: Secondary | ICD-10-CM | POA: Diagnosis not present

## 2018-01-15 DIAGNOSIS — M79605 Pain in left leg: Secondary | ICD-10-CM | POA: Diagnosis not present

## 2018-01-15 DIAGNOSIS — N2581 Secondary hyperparathyroidism of renal origin: Secondary | ICD-10-CM | POA: Diagnosis not present

## 2018-01-15 DIAGNOSIS — M79671 Pain in right foot: Secondary | ICD-10-CM | POA: Diagnosis not present

## 2018-01-15 DIAGNOSIS — T8579XA Infection and inflammatory reaction due to other internal prosthetic devices, implants and grafts, initial encounter: Secondary | ICD-10-CM | POA: Diagnosis not present

## 2018-01-15 DIAGNOSIS — E876 Hypokalemia: Secondary | ICD-10-CM | POA: Diagnosis not present

## 2018-01-15 DIAGNOSIS — N186 End stage renal disease: Secondary | ICD-10-CM | POA: Diagnosis not present

## 2018-01-17 DIAGNOSIS — D631 Anemia in chronic kidney disease: Secondary | ICD-10-CM | POA: Diagnosis not present

## 2018-01-17 DIAGNOSIS — T8579XA Infection and inflammatory reaction due to other internal prosthetic devices, implants and grafts, initial encounter: Secondary | ICD-10-CM | POA: Diagnosis not present

## 2018-01-17 DIAGNOSIS — E875 Hyperkalemia: Secondary | ICD-10-CM | POA: Diagnosis not present

## 2018-01-17 DIAGNOSIS — N2581 Secondary hyperparathyroidism of renal origin: Secondary | ICD-10-CM | POA: Diagnosis not present

## 2018-01-17 DIAGNOSIS — N186 End stage renal disease: Secondary | ICD-10-CM | POA: Diagnosis not present

## 2018-01-17 DIAGNOSIS — E876 Hypokalemia: Secondary | ICD-10-CM | POA: Diagnosis not present

## 2018-01-19 DIAGNOSIS — E876 Hypokalemia: Secondary | ICD-10-CM | POA: Diagnosis not present

## 2018-01-19 DIAGNOSIS — E875 Hyperkalemia: Secondary | ICD-10-CM | POA: Diagnosis not present

## 2018-01-19 DIAGNOSIS — N186 End stage renal disease: Secondary | ICD-10-CM | POA: Diagnosis not present

## 2018-01-19 DIAGNOSIS — D631 Anemia in chronic kidney disease: Secondary | ICD-10-CM | POA: Diagnosis not present

## 2018-01-19 DIAGNOSIS — T8579XA Infection and inflammatory reaction due to other internal prosthetic devices, implants and grafts, initial encounter: Secondary | ICD-10-CM | POA: Diagnosis not present

## 2018-01-19 DIAGNOSIS — N2581 Secondary hyperparathyroidism of renal origin: Secondary | ICD-10-CM | POA: Diagnosis not present

## 2018-01-22 DIAGNOSIS — D631 Anemia in chronic kidney disease: Secondary | ICD-10-CM | POA: Diagnosis not present

## 2018-01-22 DIAGNOSIS — T8579XA Infection and inflammatory reaction due to other internal prosthetic devices, implants and grafts, initial encounter: Secondary | ICD-10-CM | POA: Diagnosis not present

## 2018-01-22 DIAGNOSIS — E875 Hyperkalemia: Secondary | ICD-10-CM | POA: Diagnosis not present

## 2018-01-22 DIAGNOSIS — N186 End stage renal disease: Secondary | ICD-10-CM | POA: Diagnosis not present

## 2018-01-22 DIAGNOSIS — N2581 Secondary hyperparathyroidism of renal origin: Secondary | ICD-10-CM | POA: Diagnosis not present

## 2018-01-22 DIAGNOSIS — E876 Hypokalemia: Secondary | ICD-10-CM | POA: Diagnosis not present

## 2018-01-24 DIAGNOSIS — N2581 Secondary hyperparathyroidism of renal origin: Secondary | ICD-10-CM | POA: Diagnosis not present

## 2018-01-24 DIAGNOSIS — N186 End stage renal disease: Secondary | ICD-10-CM | POA: Diagnosis not present

## 2018-01-24 DIAGNOSIS — E876 Hypokalemia: Secondary | ICD-10-CM | POA: Diagnosis not present

## 2018-01-24 DIAGNOSIS — D631 Anemia in chronic kidney disease: Secondary | ICD-10-CM | POA: Diagnosis not present

## 2018-01-24 DIAGNOSIS — T8579XA Infection and inflammatory reaction due to other internal prosthetic devices, implants and grafts, initial encounter: Secondary | ICD-10-CM | POA: Diagnosis not present

## 2018-01-24 DIAGNOSIS — E875 Hyperkalemia: Secondary | ICD-10-CM | POA: Diagnosis not present

## 2018-01-26 DIAGNOSIS — N2581 Secondary hyperparathyroidism of renal origin: Secondary | ICD-10-CM | POA: Diagnosis not present

## 2018-01-26 DIAGNOSIS — E875 Hyperkalemia: Secondary | ICD-10-CM | POA: Diagnosis not present

## 2018-01-26 DIAGNOSIS — T8579XA Infection and inflammatory reaction due to other internal prosthetic devices, implants and grafts, initial encounter: Secondary | ICD-10-CM | POA: Diagnosis not present

## 2018-01-26 DIAGNOSIS — N186 End stage renal disease: Secondary | ICD-10-CM | POA: Diagnosis not present

## 2018-01-26 DIAGNOSIS — E876 Hypokalemia: Secondary | ICD-10-CM | POA: Diagnosis not present

## 2018-01-26 DIAGNOSIS — D631 Anemia in chronic kidney disease: Secondary | ICD-10-CM | POA: Diagnosis not present

## 2018-01-29 DIAGNOSIS — N186 End stage renal disease: Secondary | ICD-10-CM | POA: Diagnosis not present

## 2018-01-29 DIAGNOSIS — D631 Anemia in chronic kidney disease: Secondary | ICD-10-CM | POA: Diagnosis not present

## 2018-01-29 DIAGNOSIS — N2581 Secondary hyperparathyroidism of renal origin: Secondary | ICD-10-CM | POA: Diagnosis not present

## 2018-01-29 DIAGNOSIS — E875 Hyperkalemia: Secondary | ICD-10-CM | POA: Diagnosis not present

## 2018-01-29 DIAGNOSIS — T8579XA Infection and inflammatory reaction due to other internal prosthetic devices, implants and grafts, initial encounter: Secondary | ICD-10-CM | POA: Diagnosis not present

## 2018-01-29 DIAGNOSIS — E876 Hypokalemia: Secondary | ICD-10-CM | POA: Diagnosis not present

## 2018-01-30 DIAGNOSIS — Z4824 Encounter for aftercare following lung transplant: Secondary | ICD-10-CM | POA: Diagnosis not present

## 2018-01-30 DIAGNOSIS — N185 Chronic kidney disease, stage 5: Secondary | ICD-10-CM | POA: Diagnosis not present

## 2018-01-30 DIAGNOSIS — Z5181 Encounter for therapeutic drug level monitoring: Secondary | ICD-10-CM | POA: Diagnosis not present

## 2018-01-30 DIAGNOSIS — Z942 Lung transplant status: Secondary | ICD-10-CM | POA: Diagnosis not present

## 2018-01-30 DIAGNOSIS — D899 Disorder involving the immune mechanism, unspecified: Secondary | ICD-10-CM | POA: Diagnosis not present

## 2018-01-30 DIAGNOSIS — B259 Cytomegaloviral disease, unspecified: Secondary | ICD-10-CM | POA: Diagnosis not present

## 2018-01-30 DIAGNOSIS — R251 Tremor, unspecified: Secondary | ICD-10-CM | POA: Diagnosis not present

## 2018-01-30 DIAGNOSIS — R760 Raised antibody titer: Secondary | ICD-10-CM | POA: Diagnosis not present

## 2018-01-30 DIAGNOSIS — Z79899 Other long term (current) drug therapy: Secondary | ICD-10-CM | POA: Diagnosis not present

## 2018-01-30 DIAGNOSIS — Z9189 Other specified personal risk factors, not elsewhere classified: Secondary | ICD-10-CM | POA: Diagnosis not present

## 2018-01-30 DIAGNOSIS — Z8711 Personal history of peptic ulcer disease: Secondary | ICD-10-CM | POA: Diagnosis not present

## 2018-01-30 DIAGNOSIS — Z992 Dependence on renal dialysis: Secondary | ICD-10-CM | POA: Diagnosis not present

## 2018-01-30 DIAGNOSIS — E1122 Type 2 diabetes mellitus with diabetic chronic kidney disease: Secondary | ICD-10-CM | POA: Diagnosis not present

## 2018-01-30 DIAGNOSIS — E1140 Type 2 diabetes mellitus with diabetic neuropathy, unspecified: Secondary | ICD-10-CM | POA: Diagnosis not present

## 2018-01-31 DIAGNOSIS — N186 End stage renal disease: Secondary | ICD-10-CM | POA: Diagnosis not present

## 2018-01-31 DIAGNOSIS — E875 Hyperkalemia: Secondary | ICD-10-CM | POA: Diagnosis not present

## 2018-01-31 DIAGNOSIS — T8579XA Infection and inflammatory reaction due to other internal prosthetic devices, implants and grafts, initial encounter: Secondary | ICD-10-CM | POA: Diagnosis not present

## 2018-01-31 DIAGNOSIS — N2581 Secondary hyperparathyroidism of renal origin: Secondary | ICD-10-CM | POA: Diagnosis not present

## 2018-01-31 DIAGNOSIS — E876 Hypokalemia: Secondary | ICD-10-CM | POA: Diagnosis not present

## 2018-01-31 DIAGNOSIS — D631 Anemia in chronic kidney disease: Secondary | ICD-10-CM | POA: Diagnosis not present

## 2018-02-01 DIAGNOSIS — M7989 Other specified soft tissue disorders: Secondary | ICD-10-CM | POA: Diagnosis not present

## 2018-02-02 DIAGNOSIS — N2581 Secondary hyperparathyroidism of renal origin: Secondary | ICD-10-CM | POA: Diagnosis not present

## 2018-02-02 DIAGNOSIS — E875 Hyperkalemia: Secondary | ICD-10-CM | POA: Diagnosis not present

## 2018-02-02 DIAGNOSIS — T8579XA Infection and inflammatory reaction due to other internal prosthetic devices, implants and grafts, initial encounter: Secondary | ICD-10-CM | POA: Diagnosis not present

## 2018-02-02 DIAGNOSIS — E876 Hypokalemia: Secondary | ICD-10-CM | POA: Diagnosis not present

## 2018-02-02 DIAGNOSIS — D631 Anemia in chronic kidney disease: Secondary | ICD-10-CM | POA: Diagnosis not present

## 2018-02-02 DIAGNOSIS — N186 End stage renal disease: Secondary | ICD-10-CM | POA: Diagnosis not present

## 2018-02-04 DIAGNOSIS — T86819 Unspecified complication of lung transplant: Secondary | ICD-10-CM | POA: Diagnosis not present

## 2018-02-04 DIAGNOSIS — Z992 Dependence on renal dialysis: Secondary | ICD-10-CM | POA: Diagnosis not present

## 2018-02-04 DIAGNOSIS — N186 End stage renal disease: Secondary | ICD-10-CM | POA: Diagnosis not present

## 2018-02-05 DIAGNOSIS — E876 Hypokalemia: Secondary | ICD-10-CM | POA: Diagnosis not present

## 2018-02-05 DIAGNOSIS — N2581 Secondary hyperparathyroidism of renal origin: Secondary | ICD-10-CM | POA: Diagnosis not present

## 2018-02-05 DIAGNOSIS — N186 End stage renal disease: Secondary | ICD-10-CM | POA: Diagnosis not present

## 2018-02-05 DIAGNOSIS — T8579XA Infection and inflammatory reaction due to other internal prosthetic devices, implants and grafts, initial encounter: Secondary | ICD-10-CM | POA: Diagnosis not present

## 2018-02-07 DIAGNOSIS — E876 Hypokalemia: Secondary | ICD-10-CM | POA: Diagnosis not present

## 2018-02-07 DIAGNOSIS — N2581 Secondary hyperparathyroidism of renal origin: Secondary | ICD-10-CM | POA: Diagnosis not present

## 2018-02-07 DIAGNOSIS — N186 End stage renal disease: Secondary | ICD-10-CM | POA: Diagnosis not present

## 2018-02-07 DIAGNOSIS — T8579XA Infection and inflammatory reaction due to other internal prosthetic devices, implants and grafts, initial encounter: Secondary | ICD-10-CM | POA: Diagnosis not present

## 2018-02-09 DIAGNOSIS — T8579XA Infection and inflammatory reaction due to other internal prosthetic devices, implants and grafts, initial encounter: Secondary | ICD-10-CM | POA: Diagnosis not present

## 2018-02-09 DIAGNOSIS — N186 End stage renal disease: Secondary | ICD-10-CM | POA: Diagnosis not present

## 2018-02-09 DIAGNOSIS — N2581 Secondary hyperparathyroidism of renal origin: Secondary | ICD-10-CM | POA: Diagnosis not present

## 2018-02-09 DIAGNOSIS — E876 Hypokalemia: Secondary | ICD-10-CM | POA: Diagnosis not present

## 2018-02-12 DIAGNOSIS — E876 Hypokalemia: Secondary | ICD-10-CM | POA: Diagnosis not present

## 2018-02-12 DIAGNOSIS — T8579XA Infection and inflammatory reaction due to other internal prosthetic devices, implants and grafts, initial encounter: Secondary | ICD-10-CM | POA: Diagnosis not present

## 2018-02-12 DIAGNOSIS — N2581 Secondary hyperparathyroidism of renal origin: Secondary | ICD-10-CM | POA: Diagnosis not present

## 2018-02-12 DIAGNOSIS — N186 End stage renal disease: Secondary | ICD-10-CM | POA: Diagnosis not present

## 2018-02-13 DIAGNOSIS — Z79899 Other long term (current) drug therapy: Secondary | ICD-10-CM | POA: Diagnosis not present

## 2018-02-13 DIAGNOSIS — Z5181 Encounter for therapeutic drug level monitoring: Secondary | ICD-10-CM | POA: Diagnosis not present

## 2018-02-13 DIAGNOSIS — Z87891 Personal history of nicotine dependence: Secondary | ICD-10-CM | POA: Diagnosis not present

## 2018-02-13 DIAGNOSIS — I120 Hypertensive chronic kidney disease with stage 5 chronic kidney disease or end stage renal disease: Secondary | ICD-10-CM | POA: Diagnosis not present

## 2018-02-13 DIAGNOSIS — R760 Raised antibody titer: Secondary | ICD-10-CM | POA: Diagnosis not present

## 2018-02-13 DIAGNOSIS — E1122 Type 2 diabetes mellitus with diabetic chronic kidney disease: Secondary | ICD-10-CM | POA: Diagnosis not present

## 2018-02-13 DIAGNOSIS — E119 Type 2 diabetes mellitus without complications: Secondary | ICD-10-CM | POA: Diagnosis not present

## 2018-02-13 DIAGNOSIS — T86810 Lung transplant rejection: Secondary | ICD-10-CM | POA: Diagnosis not present

## 2018-02-13 DIAGNOSIS — N186 End stage renal disease: Secondary | ICD-10-CM | POA: Diagnosis not present

## 2018-02-13 DIAGNOSIS — Z992 Dependence on renal dialysis: Secondary | ICD-10-CM | POA: Diagnosis not present

## 2018-02-14 DIAGNOSIS — N2581 Secondary hyperparathyroidism of renal origin: Secondary | ICD-10-CM | POA: Diagnosis not present

## 2018-02-14 DIAGNOSIS — N186 End stage renal disease: Secondary | ICD-10-CM | POA: Diagnosis not present

## 2018-02-14 DIAGNOSIS — T8579XA Infection and inflammatory reaction due to other internal prosthetic devices, implants and grafts, initial encounter: Secondary | ICD-10-CM | POA: Diagnosis not present

## 2018-02-14 DIAGNOSIS — E876 Hypokalemia: Secondary | ICD-10-CM | POA: Diagnosis not present

## 2018-02-16 DIAGNOSIS — N2581 Secondary hyperparathyroidism of renal origin: Secondary | ICD-10-CM | POA: Diagnosis not present

## 2018-02-16 DIAGNOSIS — E876 Hypokalemia: Secondary | ICD-10-CM | POA: Diagnosis not present

## 2018-02-16 DIAGNOSIS — N186 End stage renal disease: Secondary | ICD-10-CM | POA: Diagnosis not present

## 2018-02-16 DIAGNOSIS — T8579XA Infection and inflammatory reaction due to other internal prosthetic devices, implants and grafts, initial encounter: Secondary | ICD-10-CM | POA: Diagnosis not present

## 2018-02-19 DIAGNOSIS — N2581 Secondary hyperparathyroidism of renal origin: Secondary | ICD-10-CM | POA: Diagnosis not present

## 2018-02-19 DIAGNOSIS — E876 Hypokalemia: Secondary | ICD-10-CM | POA: Diagnosis not present

## 2018-02-19 DIAGNOSIS — N186 End stage renal disease: Secondary | ICD-10-CM | POA: Diagnosis not present

## 2018-02-19 DIAGNOSIS — T8579XA Infection and inflammatory reaction due to other internal prosthetic devices, implants and grafts, initial encounter: Secondary | ICD-10-CM | POA: Diagnosis not present

## 2018-02-21 DIAGNOSIS — N186 End stage renal disease: Secondary | ICD-10-CM | POA: Diagnosis not present

## 2018-02-21 DIAGNOSIS — E876 Hypokalemia: Secondary | ICD-10-CM | POA: Diagnosis not present

## 2018-02-21 DIAGNOSIS — N2581 Secondary hyperparathyroidism of renal origin: Secondary | ICD-10-CM | POA: Diagnosis not present

## 2018-02-21 DIAGNOSIS — T8579XA Infection and inflammatory reaction due to other internal prosthetic devices, implants and grafts, initial encounter: Secondary | ICD-10-CM | POA: Diagnosis not present

## 2018-02-23 DIAGNOSIS — N186 End stage renal disease: Secondary | ICD-10-CM | POA: Diagnosis not present

## 2018-02-23 DIAGNOSIS — E876 Hypokalemia: Secondary | ICD-10-CM | POA: Diagnosis not present

## 2018-02-23 DIAGNOSIS — N2581 Secondary hyperparathyroidism of renal origin: Secondary | ICD-10-CM | POA: Diagnosis not present

## 2018-02-23 DIAGNOSIS — T8579XA Infection and inflammatory reaction due to other internal prosthetic devices, implants and grafts, initial encounter: Secondary | ICD-10-CM | POA: Diagnosis not present

## 2018-02-26 DIAGNOSIS — N186 End stage renal disease: Secondary | ICD-10-CM | POA: Diagnosis not present

## 2018-02-26 DIAGNOSIS — E876 Hypokalemia: Secondary | ICD-10-CM | POA: Diagnosis not present

## 2018-02-26 DIAGNOSIS — N2581 Secondary hyperparathyroidism of renal origin: Secondary | ICD-10-CM | POA: Diagnosis not present

## 2018-02-26 DIAGNOSIS — T8579XA Infection and inflammatory reaction due to other internal prosthetic devices, implants and grafts, initial encounter: Secondary | ICD-10-CM | POA: Diagnosis not present

## 2018-02-27 DIAGNOSIS — Z23 Encounter for immunization: Secondary | ICD-10-CM | POA: Diagnosis not present

## 2018-02-28 DIAGNOSIS — N186 End stage renal disease: Secondary | ICD-10-CM | POA: Diagnosis not present

## 2018-02-28 DIAGNOSIS — T8579XA Infection and inflammatory reaction due to other internal prosthetic devices, implants and grafts, initial encounter: Secondary | ICD-10-CM | POA: Diagnosis not present

## 2018-02-28 DIAGNOSIS — N2581 Secondary hyperparathyroidism of renal origin: Secondary | ICD-10-CM | POA: Diagnosis not present

## 2018-02-28 DIAGNOSIS — E876 Hypokalemia: Secondary | ICD-10-CM | POA: Diagnosis not present

## 2018-03-03 DIAGNOSIS — N186 End stage renal disease: Secondary | ICD-10-CM | POA: Diagnosis not present

## 2018-03-03 DIAGNOSIS — N2581 Secondary hyperparathyroidism of renal origin: Secondary | ICD-10-CM | POA: Diagnosis not present

## 2018-03-03 DIAGNOSIS — T8579XA Infection and inflammatory reaction due to other internal prosthetic devices, implants and grafts, initial encounter: Secondary | ICD-10-CM | POA: Diagnosis not present

## 2018-03-03 DIAGNOSIS — E876 Hypokalemia: Secondary | ICD-10-CM | POA: Diagnosis not present

## 2018-03-05 DIAGNOSIS — E876 Hypokalemia: Secondary | ICD-10-CM | POA: Diagnosis not present

## 2018-03-05 DIAGNOSIS — N186 End stage renal disease: Secondary | ICD-10-CM | POA: Diagnosis not present

## 2018-03-05 DIAGNOSIS — N2581 Secondary hyperparathyroidism of renal origin: Secondary | ICD-10-CM | POA: Diagnosis not present

## 2018-03-05 DIAGNOSIS — T8579XA Infection and inflammatory reaction due to other internal prosthetic devices, implants and grafts, initial encounter: Secondary | ICD-10-CM | POA: Diagnosis not present

## 2018-03-06 DIAGNOSIS — N186 End stage renal disease: Secondary | ICD-10-CM | POA: Diagnosis not present

## 2018-03-06 DIAGNOSIS — Z992 Dependence on renal dialysis: Secondary | ICD-10-CM | POA: Diagnosis not present

## 2018-03-06 DIAGNOSIS — T86819 Unspecified complication of lung transplant: Secondary | ICD-10-CM | POA: Diagnosis not present

## 2018-03-07 DIAGNOSIS — D509 Iron deficiency anemia, unspecified: Secondary | ICD-10-CM | POA: Diagnosis not present

## 2018-03-07 DIAGNOSIS — T8579XA Infection and inflammatory reaction due to other internal prosthetic devices, implants and grafts, initial encounter: Secondary | ICD-10-CM | POA: Diagnosis not present

## 2018-03-07 DIAGNOSIS — N2581 Secondary hyperparathyroidism of renal origin: Secondary | ICD-10-CM | POA: Diagnosis not present

## 2018-03-07 DIAGNOSIS — E8770 Fluid overload, unspecified: Secondary | ICD-10-CM | POA: Diagnosis not present

## 2018-03-07 DIAGNOSIS — N186 End stage renal disease: Secondary | ICD-10-CM | POA: Diagnosis not present

## 2018-03-07 DIAGNOSIS — E876 Hypokalemia: Secondary | ICD-10-CM | POA: Diagnosis not present

## 2018-03-09 DIAGNOSIS — N2581 Secondary hyperparathyroidism of renal origin: Secondary | ICD-10-CM | POA: Diagnosis not present

## 2018-03-09 DIAGNOSIS — D509 Iron deficiency anemia, unspecified: Secondary | ICD-10-CM | POA: Diagnosis not present

## 2018-03-09 DIAGNOSIS — N186 End stage renal disease: Secondary | ICD-10-CM | POA: Diagnosis not present

## 2018-03-09 DIAGNOSIS — E876 Hypokalemia: Secondary | ICD-10-CM | POA: Diagnosis not present

## 2018-03-09 DIAGNOSIS — E8770 Fluid overload, unspecified: Secondary | ICD-10-CM | POA: Diagnosis not present

## 2018-03-09 DIAGNOSIS — T8579XA Infection and inflammatory reaction due to other internal prosthetic devices, implants and grafts, initial encounter: Secondary | ICD-10-CM | POA: Diagnosis not present

## 2018-03-12 DIAGNOSIS — E876 Hypokalemia: Secondary | ICD-10-CM | POA: Diagnosis not present

## 2018-03-12 DIAGNOSIS — N186 End stage renal disease: Secondary | ICD-10-CM | POA: Diagnosis not present

## 2018-03-12 DIAGNOSIS — D509 Iron deficiency anemia, unspecified: Secondary | ICD-10-CM | POA: Diagnosis not present

## 2018-03-12 DIAGNOSIS — T8579XA Infection and inflammatory reaction due to other internal prosthetic devices, implants and grafts, initial encounter: Secondary | ICD-10-CM | POA: Diagnosis not present

## 2018-03-12 DIAGNOSIS — N2581 Secondary hyperparathyroidism of renal origin: Secondary | ICD-10-CM | POA: Diagnosis not present

## 2018-03-12 DIAGNOSIS — E8770 Fluid overload, unspecified: Secondary | ICD-10-CM | POA: Diagnosis not present

## 2018-03-14 DIAGNOSIS — N186 End stage renal disease: Secondary | ICD-10-CM | POA: Diagnosis not present

## 2018-03-14 DIAGNOSIS — E8770 Fluid overload, unspecified: Secondary | ICD-10-CM | POA: Diagnosis not present

## 2018-03-14 DIAGNOSIS — T8579XA Infection and inflammatory reaction due to other internal prosthetic devices, implants and grafts, initial encounter: Secondary | ICD-10-CM | POA: Diagnosis not present

## 2018-03-14 DIAGNOSIS — D509 Iron deficiency anemia, unspecified: Secondary | ICD-10-CM | POA: Diagnosis not present

## 2018-03-14 DIAGNOSIS — N2581 Secondary hyperparathyroidism of renal origin: Secondary | ICD-10-CM | POA: Diagnosis not present

## 2018-03-14 DIAGNOSIS — E876 Hypokalemia: Secondary | ICD-10-CM | POA: Diagnosis not present

## 2018-03-15 DIAGNOSIS — Z79899 Other long term (current) drug therapy: Secondary | ICD-10-CM | POA: Diagnosis not present

## 2018-03-15 DIAGNOSIS — Z298 Encounter for other specified prophylactic measures: Secondary | ICD-10-CM | POA: Diagnosis not present

## 2018-03-15 DIAGNOSIS — T86819 Unspecified complication of lung transplant: Secondary | ICD-10-CM | POA: Diagnosis not present

## 2018-03-15 DIAGNOSIS — N19 Unspecified kidney failure: Secondary | ICD-10-CM | POA: Diagnosis not present

## 2018-03-15 DIAGNOSIS — Z5181 Encounter for therapeutic drug level monitoring: Secondary | ICD-10-CM | POA: Diagnosis not present

## 2018-03-15 DIAGNOSIS — Z942 Lung transplant status: Secondary | ICD-10-CM | POA: Diagnosis not present

## 2018-03-16 DIAGNOSIS — D509 Iron deficiency anemia, unspecified: Secondary | ICD-10-CM | POA: Diagnosis not present

## 2018-03-16 DIAGNOSIS — E876 Hypokalemia: Secondary | ICD-10-CM | POA: Diagnosis not present

## 2018-03-16 DIAGNOSIS — E8770 Fluid overload, unspecified: Secondary | ICD-10-CM | POA: Diagnosis not present

## 2018-03-16 DIAGNOSIS — N2581 Secondary hyperparathyroidism of renal origin: Secondary | ICD-10-CM | POA: Diagnosis not present

## 2018-03-16 DIAGNOSIS — T8579XA Infection and inflammatory reaction due to other internal prosthetic devices, implants and grafts, initial encounter: Secondary | ICD-10-CM | POA: Diagnosis not present

## 2018-03-16 DIAGNOSIS — N186 End stage renal disease: Secondary | ICD-10-CM | POA: Diagnosis not present

## 2018-03-19 DIAGNOSIS — E8770 Fluid overload, unspecified: Secondary | ICD-10-CM | POA: Diagnosis not present

## 2018-03-19 DIAGNOSIS — D509 Iron deficiency anemia, unspecified: Secondary | ICD-10-CM | POA: Diagnosis not present

## 2018-03-19 DIAGNOSIS — T8579XA Infection and inflammatory reaction due to other internal prosthetic devices, implants and grafts, initial encounter: Secondary | ICD-10-CM | POA: Diagnosis not present

## 2018-03-19 DIAGNOSIS — E876 Hypokalemia: Secondary | ICD-10-CM | POA: Diagnosis not present

## 2018-03-19 DIAGNOSIS — N186 End stage renal disease: Secondary | ICD-10-CM | POA: Diagnosis not present

## 2018-03-19 DIAGNOSIS — N2581 Secondary hyperparathyroidism of renal origin: Secondary | ICD-10-CM | POA: Diagnosis not present

## 2018-03-21 DIAGNOSIS — D509 Iron deficiency anemia, unspecified: Secondary | ICD-10-CM | POA: Diagnosis not present

## 2018-03-21 DIAGNOSIS — N186 End stage renal disease: Secondary | ICD-10-CM | POA: Diagnosis not present

## 2018-03-21 DIAGNOSIS — T8579XA Infection and inflammatory reaction due to other internal prosthetic devices, implants and grafts, initial encounter: Secondary | ICD-10-CM | POA: Diagnosis not present

## 2018-03-21 DIAGNOSIS — E876 Hypokalemia: Secondary | ICD-10-CM | POA: Diagnosis not present

## 2018-03-21 DIAGNOSIS — N2581 Secondary hyperparathyroidism of renal origin: Secondary | ICD-10-CM | POA: Diagnosis not present

## 2018-03-21 DIAGNOSIS — E8770 Fluid overload, unspecified: Secondary | ICD-10-CM | POA: Diagnosis not present

## 2018-03-23 DIAGNOSIS — E8770 Fluid overload, unspecified: Secondary | ICD-10-CM | POA: Diagnosis not present

## 2018-03-23 DIAGNOSIS — H11829 Conjunctivochalasis, unspecified eye: Secondary | ICD-10-CM | POA: Diagnosis not present

## 2018-03-23 DIAGNOSIS — E876 Hypokalemia: Secondary | ICD-10-CM | POA: Diagnosis not present

## 2018-03-23 DIAGNOSIS — N2581 Secondary hyperparathyroidism of renal origin: Secondary | ICD-10-CM | POA: Diagnosis not present

## 2018-03-23 DIAGNOSIS — H04203 Unspecified epiphora, bilateral lacrimal glands: Secondary | ICD-10-CM | POA: Diagnosis not present

## 2018-03-23 DIAGNOSIS — D509 Iron deficiency anemia, unspecified: Secondary | ICD-10-CM | POA: Diagnosis not present

## 2018-03-23 DIAGNOSIS — T8579XA Infection and inflammatory reaction due to other internal prosthetic devices, implants and grafts, initial encounter: Secondary | ICD-10-CM | POA: Diagnosis not present

## 2018-03-23 DIAGNOSIS — N186 End stage renal disease: Secondary | ICD-10-CM | POA: Diagnosis not present

## 2018-03-26 DIAGNOSIS — N2581 Secondary hyperparathyroidism of renal origin: Secondary | ICD-10-CM | POA: Diagnosis not present

## 2018-03-26 DIAGNOSIS — N186 End stage renal disease: Secondary | ICD-10-CM | POA: Diagnosis not present

## 2018-03-26 DIAGNOSIS — E876 Hypokalemia: Secondary | ICD-10-CM | POA: Diagnosis not present

## 2018-03-26 DIAGNOSIS — E8770 Fluid overload, unspecified: Secondary | ICD-10-CM | POA: Diagnosis not present

## 2018-03-26 DIAGNOSIS — T8579XA Infection and inflammatory reaction due to other internal prosthetic devices, implants and grafts, initial encounter: Secondary | ICD-10-CM | POA: Diagnosis not present

## 2018-03-26 DIAGNOSIS — D509 Iron deficiency anemia, unspecified: Secondary | ICD-10-CM | POA: Diagnosis not present

## 2018-03-28 ENCOUNTER — Other Ambulatory Visit: Payer: Self-pay

## 2018-03-28 ENCOUNTER — Encounter (HOSPITAL_COMMUNITY): Payer: Self-pay

## 2018-03-28 ENCOUNTER — Emergency Department (HOSPITAL_COMMUNITY): Payer: Medicare Other

## 2018-03-28 ENCOUNTER — Emergency Department (HOSPITAL_COMMUNITY)
Admission: EM | Admit: 2018-03-28 | Discharge: 2018-03-28 | Disposition: A | Discharge status: Home / Self Care | Payer: Medicare Other | Attending: Emergency Medicine | Admitting: Emergency Medicine

## 2018-03-28 DIAGNOSIS — I120 Hypertensive chronic kidney disease with stage 5 chronic kidney disease or end stage renal disease: Secondary | ICD-10-CM | POA: Insufficient documentation

## 2018-03-28 DIAGNOSIS — Z79899 Other long term (current) drug therapy: Secondary | ICD-10-CM | POA: Diagnosis not present

## 2018-03-28 DIAGNOSIS — T8579XA Infection and inflammatory reaction due to other internal prosthetic devices, implants and grafts, initial encounter: Secondary | ICD-10-CM | POA: Diagnosis not present

## 2018-03-28 DIAGNOSIS — Z87891 Personal history of nicotine dependence: Secondary | ICD-10-CM | POA: Insufficient documentation

## 2018-03-28 DIAGNOSIS — Z942 Lung transplant status: Secondary | ICD-10-CM | POA: Insufficient documentation

## 2018-03-28 DIAGNOSIS — D509 Iron deficiency anemia, unspecified: Secondary | ICD-10-CM | POA: Diagnosis not present

## 2018-03-28 DIAGNOSIS — R000 Tachycardia, unspecified: Secondary | ICD-10-CM | POA: Diagnosis not present

## 2018-03-28 DIAGNOSIS — R410 Disorientation, unspecified: Secondary | ICD-10-CM | POA: Diagnosis not present

## 2018-03-28 DIAGNOSIS — Z794 Long term (current) use of insulin: Secondary | ICD-10-CM | POA: Insufficient documentation

## 2018-03-28 DIAGNOSIS — N2581 Secondary hyperparathyroidism of renal origin: Secondary | ICD-10-CM | POA: Diagnosis not present

## 2018-03-28 DIAGNOSIS — R0602 Shortness of breath: Secondary | ICD-10-CM | POA: Diagnosis not present

## 2018-03-28 DIAGNOSIS — E1122 Type 2 diabetes mellitus with diabetic chronic kidney disease: Secondary | ICD-10-CM | POA: Insufficient documentation

## 2018-03-28 DIAGNOSIS — Z992 Dependence on renal dialysis: Secondary | ICD-10-CM | POA: Diagnosis not present

## 2018-03-28 DIAGNOSIS — Z7982 Long term (current) use of aspirin: Secondary | ICD-10-CM | POA: Diagnosis not present

## 2018-03-28 DIAGNOSIS — E8770 Fluid overload, unspecified: Secondary | ICD-10-CM | POA: Diagnosis not present

## 2018-03-28 DIAGNOSIS — I10 Essential (primary) hypertension: Secondary | ICD-10-CM | POA: Diagnosis not present

## 2018-03-28 DIAGNOSIS — N186 End stage renal disease: Secondary | ICD-10-CM | POA: Insufficient documentation

## 2018-03-28 DIAGNOSIS — R251 Tremor, unspecified: Secondary | ICD-10-CM | POA: Insufficient documentation

## 2018-03-28 DIAGNOSIS — E876 Hypokalemia: Secondary | ICD-10-CM | POA: Diagnosis not present

## 2018-03-28 DIAGNOSIS — R4020 Unspecified coma: Secondary | ICD-10-CM | POA: Diagnosis not present

## 2018-03-28 LAB — I-STAT VENOUS BLOOD GAS, ED
ACID-BASE EXCESS: 4 mmol/L — AB (ref 0.0–2.0)
Bicarbonate: 26.8 mmol/L (ref 20.0–28.0)
O2 SAT: 53 %
PCO2 VEN: 34.6 mmHg — AB (ref 44.0–60.0)
PO2 VEN: 25 mmHg — AB (ref 32.0–45.0)
TCO2: 28 mmol/L (ref 22–32)
pH, Ven: 7.496 — ABNORMAL HIGH (ref 7.250–7.430)

## 2018-03-28 LAB — I-STAT CHEM 8, ED
BUN: 41 mg/dL — ABNORMAL HIGH (ref 8–23)
CREATININE: 7.6 mg/dL — AB (ref 0.61–1.24)
Calcium, Ion: 0.94 mmol/L — ABNORMAL LOW (ref 1.15–1.40)
Chloride: 100 mmol/L (ref 98–111)
Glucose, Bld: 125 mg/dL — ABNORMAL HIGH (ref 70–99)
HEMATOCRIT: 36 % — AB (ref 39.0–52.0)
HEMOGLOBIN: 12.2 g/dL — AB (ref 13.0–17.0)
POTASSIUM: 4.5 mmol/L (ref 3.5–5.1)
Sodium: 134 mmol/L — ABNORMAL LOW (ref 135–145)
TCO2: 27 mmol/L (ref 22–32)

## 2018-03-28 LAB — CBC WITH DIFFERENTIAL/PLATELET
ABS IMMATURE GRANULOCYTES: 0.06 10*3/uL (ref 0.00–0.07)
Basophils Absolute: 0.1 10*3/uL (ref 0.0–0.1)
Basophils Relative: 1 %
Eosinophils Absolute: 0 10*3/uL (ref 0.0–0.5)
Eosinophils Relative: 1 %
HCT: 37.8 % — ABNORMAL LOW (ref 39.0–52.0)
HEMOGLOBIN: 12.2 g/dL — AB (ref 13.0–17.0)
IMMATURE GRANULOCYTES: 1 %
LYMPHS PCT: 13 %
Lymphs Abs: 0.9 10*3/uL (ref 0.7–4.0)
MCH: 34.1 pg — AB (ref 26.0–34.0)
MCHC: 32.3 g/dL (ref 30.0–36.0)
MCV: 105.6 fL — ABNORMAL HIGH (ref 80.0–100.0)
MONO ABS: 0.5 10*3/uL (ref 0.1–1.0)
MONOS PCT: 7 %
NEUTROS ABS: 5.3 10*3/uL (ref 1.7–7.7)
NEUTROS PCT: 77 %
Platelets: 131 10*3/uL — ABNORMAL LOW (ref 150–400)
RBC: 3.58 MIL/uL — AB (ref 4.22–5.81)
RDW: 14.4 % (ref 11.5–15.5)
WBC: 6.8 10*3/uL (ref 4.0–10.5)
nRBC: 0 % (ref 0.0–0.2)

## 2018-03-28 LAB — CBG MONITORING, ED: Glucose-Capillary: 109 mg/dL — ABNORMAL HIGH (ref 70–99)

## 2018-03-28 LAB — I-STAT TROPONIN, ED: TROPONIN I, POC: 0.01 ng/mL (ref 0.00–0.08)

## 2018-03-28 MED ORDER — LORAZEPAM 2 MG/ML IJ SOLN
0.5000 mg | Freq: Once | INTRAMUSCULAR | Status: AC
  Administered 2018-03-28: 0.5 mg via INTRAVENOUS
  Filled 2018-03-28: qty 1

## 2018-03-28 NOTE — ED Provider Notes (Signed)
[facility] EMERGENCY DEPARTMENT Provider Note   CSN: [id number] Arrival date & time: 03/28/18  7829     History   Chief Complaint Chief Complaint  Patient presents with  . Shortness of Breath    HPI [name] is a 67 y.o. male.  HPI   He presents for evaluation of shortness of breath which started during his dialysis treatment today.  He was unable to complete more than about 30% of the treatment.  He presents by EMS for evaluation.  He is unable to give additional history.  Level 5 caveat-altered mental status  Past Medical History:  Diagnosis Date  . Aortic valve disorders   . Benign neoplasm of colon   . Degeneration of intervertebral disc, site unspecified   . Diabetes mellitus without complication ([place])   . 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 Esophageal reflux   . ESRD (end stage renal disease) on dialysis ([place])   . Essential hypertension   . Obstructive sleep apnea (adult) (pediatric)   . Osteoarthrosis, unspecified whether generalized or localized, unspecified site   . Other and unspecified hyperlipidemia   . Pneumonia    interstitial pneumonia  . Pulmonary fibrosis ([place])   . Renal disorder   . Respiratory failure with hypoxia ([place]) 12/2015  . Shortness of breath dyspnea   . Transplanted, lung ([place])   . Unspecified essential hypertension     Patient Active Problem List   Diagnosis Date Noted  . Sepsis ([place])   . CKD (chronic kidney disease)   . Generalized weakness 12/30/2015  . ESRD (end stage renal disease) on dialysis ([place])   . 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 Acute respiratory failure with hypoxemia ([place]) 04/18/2015  . Septic shock ([place]) 04/18/2015  . Acute encephalopathy 04/18/2015  . Acute respiratory failure ([place]) 04/18/2015  . Cardiac arrest ([place])   . HCAP (healthcare-associated pneumonia)   . Elevated rheumatoid factor 05/09/2014  . Essential hypertension 05/09/2014  . ILD  (interstitial lung disease) ([place]) 05/09/2014  . Obstructive apnea 05/09/2014  . Lung nodule, solitary 04/18/2014  . Awaiting organ transplant 04/18/2014  . Acute on chronic respiratory failure with hypoxia ([place]) 08/15/2013  . Edema 07/05/2013  . Chronic respiratory failure ([place]) 03/03/2013  . Diabetes mellitus with complication ([place]) 56/21/3086  . Acute sinusitis 05/04/2011  . Cough 11/10/2010  . 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 Obstructive sleep apnea 10/09/2008  . ACID REFLUX DISEASE 10/09/2008  . HIATAL HERNIA 10/09/2008  . OSTEOARTHRITIS 10/09/2008    Past Surgical History:  Procedure Laterality Date  . COLONOSCOPY WITH PROPOFOL N/A 11/07/2017   Procedure: COLONOSCOPY WITH PROPOFOL;  Surgeon: Ronnette Juniper, MD;  Location: [facility] ENDOSCOPY;  Service: Gastroenterology;  Laterality: N/A;  . LUNG BIOPSY  2010  . LUNG TRANSPLANT, SINGLE Right   . POLYPECTOMY  11/07/2017   Procedure: POLYPECTOMY;  Surgeon: Ronnette Juniper, MD;  Location: [facility];  Service: Gastroenterology;;        Home Medications    Prior to Admission medications   Medication Sig Start Date End Date Taking? Authorizing Provider  ACCU-CHEK FASTCLIX LANCETS MISC As directed up to 4 times daily 12/16/15   [provider]  acetaminophen (TYLENOL) 500 MG tablet Take 500-1,000 mg by mouth every 6 (six) hours as needed for headache.    [provider]  aspirin 81 MG tablet Take 81 mg by mouth daily.     [provider]  azaTHIOprine (IMURAN) 50 MG tablet  Take 1/2 tablet (25 mg) every Monday, Wednesday and Friday after HD. 02/15/17   [provider]  calcium acetate (PHOSLO) 667 MG capsule Take 1,334 mg by mouth 3 (three) times daily before meals.     [provider]  cycloSPORINE modified (NEORAL) 25 MG capsule Take 75 mg by mouth 2 (two) times daily.     [provider]  doxercalciferol (HECTOROL) 4 MCG/2ML injection Inject into the vein.     [provider]  fluticasone (FLONASE) 50 MCG/ACT nasal spray Place 2 sprays into the nose daily. Patient taking differently: Place 2 sprays into the nose daily as needed for allergies.  03/05/13   Wenda Low, MD  hydrOXYzine (ATARAX/VISTARIL) 25 MG tablet Take by mouth.    [provider]  insulin regular (NOVOLIN R) 100 units/mL injection Inject 5 Units into the skin 3 (three) times daily before meals. Additional units (per sliding scale): BGL 201-250 = 1 unit; 251-300 = 2 units; 301-350 = 3 units; 351-400 = units; >401 = 5 units + CALL LUNG COORDINATOR @ [facility]    [provider]  insulin regular (NOVOLIN R,HUMULIN R) 100 units/mL injection 201 - 250 mg/dL 1 units, 251 - 300 mg/dL 2 units, 301 - 350 mg/dL 3 units, >350 mg/dL Notify Provider 12/12/16   [provider]  latanoprost (XALATAN) 0.005 % ophthalmic solution Place 1 drop into both eyes at bedtime.    [provider]  metoCLOPramide (REGLAN) 5 MG tablet Take by mouth. 11/23/16 11/23/17  [provider]  midodrine (PROAMATINE) 10 MG tablet Take by mouth. 12/07/16   [provider]  Multiple Vitamin (MULTIVITAMIN WITH MINERALS) TABS tablet Take 1 tablet by mouth daily.    [provider]  omeprazole (PRILOSEC) 20 MG capsule Take by mouth. 07/05/16   [provider]  ondansetron (ZOFRAN) 4 MG tablet Take 4 mg by mouth every 8 (eight) hours as needed for nausea or vomiting.  09/04/15   [provider]  polyvinyl alcohol-povidone (REFRESH) 1.4-0.6 % ophthalmic solution Place 1-2 drops into both eyes daily as needed (for dryness).    [provider]  pravastatin (PRAVACHOL) 20 MG tablet Take 20 mg by mouth every evening.     [provider]  predniSONE (DELTASONE) 5 MG tablet Take by mouth. 08/15/16   [provider]  sertraline (ZOLOFT) 50 MG tablet Take 50 mg by mouth daily.    [provider]  sulfamethoxazole-trimethoprim  (BACTRIM,SEPTRA) 400-80 MG tablet Take 1 tablet by mouth 3 (three) times a week. Monday, Wednesday, Friday 02/27/17   [provider]  valGANciclovir (VALCYTE) 450 MG tablet Take 1 tablet by mouth See admin instructions. Every Monday and Friday AFTER DIALYSIS 03/26/15   [provider]    Family History Family History  Problem Relation Age of Onset  . Pancreatic cancer Brother   . Heart disease Father     Social History Social History   Tobacco Use  . Smoking status: Former Smoker    Packs/day: 0.30    Years: 20.00    Pack years: 6.00    Types: Cigarettes    Last attempt to quit: 06/06/2002    Years since quitting: 15.8  . Smokeless tobacco: Never Used  Substance Use Topics  . Alcohol use: No    Alcohol/week: 0.0 standard drinks  . Drug use: No     Allergies   Levofloxacin and Nsaids   Review of Systems Review of Systems  Unable to perform ROS: Mental  status change     Physical Exam Updated Vital Signs BP (!) 104/58   Pulse 71   Temp (!) 97.5 F (36.4 C) (Oral)   Resp 15   Ht 5\' 4"  (1.626 m)   Wt 63.5 kg   SpO2 97%   BMI 24.03 kg/m   Physical Exam  Constitutional: He is oriented to person, place, and time. He appears well-developed. He appears ill. He appears distressed (He appears anxious.  He speaks intermittently, in a normal voice.).  Elderly, frail  HENT:  Head: Normocephalic and atraumatic.  Right Ear: External ear normal.  Left Ear: External ear normal.  Eyes: Pupils are equal, round, and reactive to light. EOM are normal.  Bilateral conjunctival injection with mild chemosis.  Neck: Normal range of motion and phonation normal. Neck supple.  Cardiovascular: Normal rate, regular rhythm and normal heart sounds.  Pulmonary/Chest: Effort normal and breath sounds normal. He exhibits no bony tenderness.  Abdominal: Soft. There is no tenderness.  Musculoskeletal: Normal range of motion.  Neurological: He is alert and oriented to person,  place, and time. No cranial nerve deficit or sensory deficit. He exhibits normal muscle tone. Coordination normal.  Skin: Skin is warm, dry and intact.  Psychiatric: He has a normal mood and affect. His behavior is normal. Judgment and thought content normal.  Nursing note and vitals reviewed.    ED Treatments / Results  Labs (all labs ordered are listed, but only abnormal results are displayed) Labs Reviewed  CBC WITH DIFFERENTIAL/PLATELET - Abnormal; Notable for the following components:      Result Value   RBC 3.58 (*)    Hemoglobin 12.2 (*)    HCT 37.8 (*)    MCV 105.6 (*)    MCH 34.1 (*)    Platelets 131 (*)    All other components within normal limits  CBG MONITORING, ED - Abnormal; Notable for the following components:   Glucose-Capillary 109 (*)    All other components within normal limits  I-STAT CHEM 8, ED - Abnormal; Notable for the following components:   Sodium 134 (*)    BUN 41 (*)    Creatinine, Ser 7.60 (*)    Glucose, Bld 125 (*)    Calcium, Ion 0.94 (*)    Hemoglobin 12.2 (*)    HCT 36.0 (*)    All other components within normal limits  I-STAT VENOUS BLOOD GAS, ED - Abnormal; Notable for the following components:   pH, Ven 7.496 (*)    pCO2, Ven 34.6 (*)    pO2, Ven 25.0 (*)    Acid-Base Excess 4.0 (*)    All other components within normal limits  BLOOD GAS, VENOUS  I-STAT TROPONIN, ED    EKG EKG Interpretation  Date/Time:  Wednesday March 28 2018 09:52:02 EDT Ventricular Rate:  80 PR Interval:    QRS Duration: 86 QT Interval:  468 QTC Calculation: 540 R Axis:   9 Text Interpretation:  Sinus rhythm Low voltage, precordial leads Abnormal R-wave progression, early transition Minimal ST depression, inferior leads Prolonged QT interval Since last tracing QT has lengthened Artifact Confirmed by Daleen Bo [phone]) on 03/28/2018 9:57:32 AM   Radiology Dg Chest Port 1 View  Result Date: 03/28/2018 CLINICAL DATA:  67 year old male with  shortness of breath and lethargy during dialysis today. History of a prior right lung transplantation. EXAM: PORTABLE CHEST 1 VIEW COMPARISON:  Prior chest x-ray 06/24/2016 FINDINGS: Stable cardiac and mediastinal contours with right to left shift of the  structures due to volume loss and advanced fibrosis in the left lung. The transplanted right lung remains well aerated. No evidence of pneumothorax, pleural effusion or focal airspace consolidation. Interval placement of a metallic stent in the region the cephalic arch compared to the prior chest x-ray. Stable stent in the left innominate vein. Calcifications again noted in the transverse aorta. No acute osseous abnormality. IMPRESSION: Stable chest x-ray without evidence of acute cardiopulmonary process. Evidence of advanced fibrosis in the native left lung and surgical changes of prior right lung transplant. Aortic Atherosclerosis (ICD10-170.0). Electronically Signed   By: Jacqulynn Cadet M.D.   On: 03/28/2018 10:34    Procedures Procedures (including critical care time)  Medications Ordered in ED Medications  LORazepam (ATIVAN) injection 0.5 mg (0.5 mg Intravenous Given 03/28/18 1058)     Initial Impression / Assessment and Plan / ED Course  I have reviewed the triage vital signs and the nursing notes.  Pertinent labs & imaging results that were available during my care of the patient were reviewed by me and considered in my medical decision making (see chart for details).  Clinical Course as of Mar 28 1349  Wed Mar 28, 2018  1034 Patient was calm at this time then when I began talking to him about what happened this morning he was describing trembling, and began trembling again.  Ativan ordered.   [EW]  1035 Patient's wife currently here and states that he has recovered his baseline, mental status.  Patient remembers eating a sandwich this morning then began to sweat and started trembling while on dialysis.  He denies shortness of breath,  cough or fever at this time.   [EW]  1220 Normal except PHI, CO2 low, PO2 low  I-Stat venous blood gas, ED(!!) [EW]  1220 Mild elevation  CBG monitoring, ED(!) [EW]  1220 Normal except sodium low, BUN high, creatinine high, glucose high, calcium low, hemoglobin low  I-stat Chem 8, ED(!) [EW]  1221 Normal  I-stat troponin, ED [EW]  1221 Normal except hemoglobin low, MCV high, platelets low  CBC with Differential(!) [EW]  1223 Chronic changes, right to left shift, aerated right lung, thrombotic left lung.  Images reviewed by me  DG Chest [facility] [EW]  0768 At this point patient is sleeping, and difficult to arouse.  Wife is with him and states that he is doing better.   [EW]  0881 Case discussed with nephrology, Dr. Servando Salina.  He states that the patient is stable for discharge and can be dialyzed tomorrow at 1 PM at his dialysis facility.   [EW]    Clinical Course User Index [EW] Daleen Bo, MD    Patient Vitals for the past 24 hrs:  BP Temp Temp src Pulse Resp SpO2 Height Weight  03/28/18 1300 (!) 104/58 - - 71 15 97 % - -  03/28/18 1230 (!) 109/58 - - 70 (!) 21 98 % - -  03/28/18 1215 (!) 118/59 - - 73 15 95 % - -  03/28/18 0955 (!) 180/82 (!) 97.5 F (36.4 C) Oral 77 (!) 26 99 % - -  03/28/18 0943 - - - - - - 5\' 4"  (1.626 m) 63.5 kg  03/28/18 0941 - - - - - 100 % - -    1:48 PM Reevaluation with update and discussion. After initial assessment and treatment, an updated evaluation reveals at this time he is sleepy but arousable and states he is comfortable.  Wife is concerned about his tenuous fluid  status, getting dehydrated and each dialysis treatment.  I encouraged her to discuss this with the patient's nephrologist.  Findings discussed with patient and wife, all questions answered. Daleen Bo   Medical Decision Making: Nonspecific altered mental status.  Patient has history of lung transplant.  He follows regularly with the transplant service at [facility] in  [place], [place].  Last had cyclosporine level done, 1 week ago and it was normal.  No changes in medications were initiated at that time.  Today he had respiratory distress while at dialysis.  Room air oxygen on arrival, normal.  CRITICAL CARE-no Performed by: Daleen Bo  Nursing Notes Reviewed/ Care Coordinated Applicable Imaging Reviewed Interpretation of Laboratory Data incorporated into ED treatment  The patient appears reasonably screened and/or stabilized for discharge and I doubt any other medical condition or other [facility] requiring further screening, evaluation, or treatment in the ED at this time prior to discharge.  Plan: Home Medications-continue usual medications; Home Treatments-rest, fluids; return here if the recommended treatment, does not improve the symptoms; Recommended follow up-for dialysis tomorrow, likely to continue the following day.    Final Clinical Impressions(s) / ED Diagnoses   Final diagnoses:  Tremor    ED Discharge Orders    None       Daleen Bo, MD 03/28/18 1350

## 2018-03-28 NOTE — ED Notes (Signed)
IV team consulted to deaccess pt's dialysis fistula

## 2018-03-28 NOTE — Discharge Instructions (Signed)
There was no serious problem found today associated with the tremor which she had while at dialysis.  Make sure that you are getting plenty of rest, and drinking the appropriate amount of fluids and eating 3 meals each day.  Follow-up tomorrow at your dialysis unit for another dialysis session at 1 PM.  They will likely need to re-dialyze you the following day.  Return here, if needed, for problems.

## 2018-03-28 NOTE — ED Triage Notes (Signed)
Pt arrives to ED from dialysis with complaints of shortness of breath since this morning during dialysis, pt got through 1.5 of his 4 hours of dialysis. EMS reports pt has hx of tremors during dialysis and was "normal" during transport, pt became diaphoretic and tachypnic upon arrival to ED. pt alert and oriented, dialysis access still in place on left arm. Pt placed in position of comfort with bed locked and lowered, call bell in reach.

## 2018-03-29 DIAGNOSIS — N186 End stage renal disease: Secondary | ICD-10-CM | POA: Diagnosis not present

## 2018-03-29 DIAGNOSIS — D509 Iron deficiency anemia, unspecified: Secondary | ICD-10-CM | POA: Diagnosis not present

## 2018-03-29 DIAGNOSIS — T8579XA Infection and inflammatory reaction due to other internal prosthetic devices, implants and grafts, initial encounter: Secondary | ICD-10-CM | POA: Diagnosis not present

## 2018-03-29 DIAGNOSIS — E8770 Fluid overload, unspecified: Secondary | ICD-10-CM | POA: Diagnosis not present

## 2018-03-29 DIAGNOSIS — N2581 Secondary hyperparathyroidism of renal origin: Secondary | ICD-10-CM | POA: Diagnosis not present

## 2018-03-29 DIAGNOSIS — E876 Hypokalemia: Secondary | ICD-10-CM | POA: Diagnosis not present

## 2018-03-30 DIAGNOSIS — E876 Hypokalemia: Secondary | ICD-10-CM | POA: Diagnosis not present

## 2018-03-30 DIAGNOSIS — N2581 Secondary hyperparathyroidism of renal origin: Secondary | ICD-10-CM | POA: Diagnosis not present

## 2018-03-30 DIAGNOSIS — E8770 Fluid overload, unspecified: Secondary | ICD-10-CM | POA: Diagnosis not present

## 2018-03-30 DIAGNOSIS — N186 End stage renal disease: Secondary | ICD-10-CM | POA: Diagnosis not present

## 2018-03-30 DIAGNOSIS — D509 Iron deficiency anemia, unspecified: Secondary | ICD-10-CM | POA: Diagnosis not present

## 2018-03-30 DIAGNOSIS — T8579XA Infection and inflammatory reaction due to other internal prosthetic devices, implants and grafts, initial encounter: Secondary | ICD-10-CM | POA: Diagnosis not present

## 2018-04-02 DIAGNOSIS — N186 End stage renal disease: Secondary | ICD-10-CM | POA: Diagnosis not present

## 2018-04-02 DIAGNOSIS — T8579XA Infection and inflammatory reaction due to other internal prosthetic devices, implants and grafts, initial encounter: Secondary | ICD-10-CM | POA: Diagnosis not present

## 2018-04-02 DIAGNOSIS — D509 Iron deficiency anemia, unspecified: Secondary | ICD-10-CM | POA: Diagnosis not present

## 2018-04-02 DIAGNOSIS — E8770 Fluid overload, unspecified: Secondary | ICD-10-CM | POA: Diagnosis not present

## 2018-04-02 DIAGNOSIS — N2581 Secondary hyperparathyroidism of renal origin: Secondary | ICD-10-CM | POA: Diagnosis not present

## 2018-04-02 DIAGNOSIS — E876 Hypokalemia: Secondary | ICD-10-CM | POA: Diagnosis not present

## 2018-04-04 DIAGNOSIS — E876 Hypokalemia: Secondary | ICD-10-CM | POA: Diagnosis not present

## 2018-04-04 DIAGNOSIS — D509 Iron deficiency anemia, unspecified: Secondary | ICD-10-CM | POA: Diagnosis not present

## 2018-04-04 DIAGNOSIS — N2581 Secondary hyperparathyroidism of renal origin: Secondary | ICD-10-CM | POA: Diagnosis not present

## 2018-04-04 DIAGNOSIS — E8770 Fluid overload, unspecified: Secondary | ICD-10-CM | POA: Diagnosis not present

## 2018-04-04 DIAGNOSIS — N186 End stage renal disease: Secondary | ICD-10-CM | POA: Diagnosis not present

## 2018-04-04 DIAGNOSIS — T8579XA Infection and inflammatory reaction due to other internal prosthetic devices, implants and grafts, initial encounter: Secondary | ICD-10-CM | POA: Diagnosis not present

## 2018-04-06 DIAGNOSIS — N2581 Secondary hyperparathyroidism of renal origin: Secondary | ICD-10-CM | POA: Diagnosis not present

## 2018-04-06 DIAGNOSIS — E876 Hypokalemia: Secondary | ICD-10-CM | POA: Diagnosis not present

## 2018-04-06 DIAGNOSIS — N186 End stage renal disease: Secondary | ICD-10-CM | POA: Diagnosis not present

## 2018-04-06 DIAGNOSIS — Z992 Dependence on renal dialysis: Secondary | ICD-10-CM | POA: Diagnosis not present

## 2018-04-06 DIAGNOSIS — T8579XA Infection and inflammatory reaction due to other internal prosthetic devices, implants and grafts, initial encounter: Secondary | ICD-10-CM | POA: Diagnosis not present

## 2018-04-06 DIAGNOSIS — T86819 Unspecified complication of lung transplant: Secondary | ICD-10-CM | POA: Diagnosis not present

## 2018-04-09 DIAGNOSIS — N186 End stage renal disease: Secondary | ICD-10-CM | POA: Diagnosis not present

## 2018-04-09 DIAGNOSIS — T8579XA Infection and inflammatory reaction due to other internal prosthetic devices, implants and grafts, initial encounter: Secondary | ICD-10-CM | POA: Diagnosis not present

## 2018-04-09 DIAGNOSIS — E876 Hypokalemia: Secondary | ICD-10-CM | POA: Diagnosis not present

## 2018-04-09 DIAGNOSIS — N2581 Secondary hyperparathyroidism of renal origin: Secondary | ICD-10-CM | POA: Diagnosis not present

## 2018-04-11 DIAGNOSIS — N186 End stage renal disease: Secondary | ICD-10-CM | POA: Diagnosis not present

## 2018-04-11 DIAGNOSIS — E876 Hypokalemia: Secondary | ICD-10-CM | POA: Diagnosis not present

## 2018-04-11 DIAGNOSIS — T8579XA Infection and inflammatory reaction due to other internal prosthetic devices, implants and grafts, initial encounter: Secondary | ICD-10-CM | POA: Diagnosis not present

## 2018-04-11 DIAGNOSIS — N2581 Secondary hyperparathyroidism of renal origin: Secondary | ICD-10-CM | POA: Diagnosis not present

## 2018-04-13 DIAGNOSIS — N2581 Secondary hyperparathyroidism of renal origin: Secondary | ICD-10-CM | POA: Diagnosis not present

## 2018-04-13 DIAGNOSIS — E876 Hypokalemia: Secondary | ICD-10-CM | POA: Diagnosis not present

## 2018-04-13 DIAGNOSIS — N186 End stage renal disease: Secondary | ICD-10-CM | POA: Diagnosis not present

## 2018-04-13 DIAGNOSIS — T8579XA Infection and inflammatory reaction due to other internal prosthetic devices, implants and grafts, initial encounter: Secondary | ICD-10-CM | POA: Diagnosis not present

## 2018-04-16 DIAGNOSIS — E876 Hypokalemia: Secondary | ICD-10-CM | POA: Diagnosis not present

## 2018-04-16 DIAGNOSIS — N186 End stage renal disease: Secondary | ICD-10-CM | POA: Diagnosis not present

## 2018-04-16 DIAGNOSIS — T8579XA Infection and inflammatory reaction due to other internal prosthetic devices, implants and grafts, initial encounter: Secondary | ICD-10-CM | POA: Diagnosis not present

## 2018-04-16 DIAGNOSIS — N2581 Secondary hyperparathyroidism of renal origin: Secondary | ICD-10-CM | POA: Diagnosis not present

## 2018-04-18 DIAGNOSIS — T8579XA Infection and inflammatory reaction due to other internal prosthetic devices, implants and grafts, initial encounter: Secondary | ICD-10-CM | POA: Diagnosis not present

## 2018-04-18 DIAGNOSIS — N2581 Secondary hyperparathyroidism of renal origin: Secondary | ICD-10-CM | POA: Diagnosis not present

## 2018-04-18 DIAGNOSIS — N186 End stage renal disease: Secondary | ICD-10-CM | POA: Diagnosis not present

## 2018-04-18 DIAGNOSIS — E876 Hypokalemia: Secondary | ICD-10-CM | POA: Diagnosis not present

## 2018-04-19 DIAGNOSIS — Z942 Lung transplant status: Secondary | ICD-10-CM | POA: Diagnosis not present

## 2018-04-19 DIAGNOSIS — N19 Unspecified kidney failure: Secondary | ICD-10-CM | POA: Diagnosis not present

## 2018-04-19 DIAGNOSIS — T86819 Unspecified complication of lung transplant: Secondary | ICD-10-CM | POA: Diagnosis not present

## 2018-04-19 DIAGNOSIS — Z298 Encounter for other specified prophylactic measures: Secondary | ICD-10-CM | POA: Diagnosis not present

## 2018-04-19 DIAGNOSIS — Z5181 Encounter for therapeutic drug level monitoring: Secondary | ICD-10-CM | POA: Diagnosis not present

## 2018-04-19 DIAGNOSIS — Z79899 Other long term (current) drug therapy: Secondary | ICD-10-CM | POA: Diagnosis not present

## 2018-04-20 DIAGNOSIS — T8579XA Infection and inflammatory reaction due to other internal prosthetic devices, implants and grafts, initial encounter: Secondary | ICD-10-CM | POA: Diagnosis not present

## 2018-04-20 DIAGNOSIS — N2581 Secondary hyperparathyroidism of renal origin: Secondary | ICD-10-CM | POA: Diagnosis not present

## 2018-04-20 DIAGNOSIS — N186 End stage renal disease: Secondary | ICD-10-CM | POA: Diagnosis not present

## 2018-04-20 DIAGNOSIS — E876 Hypokalemia: Secondary | ICD-10-CM | POA: Diagnosis not present

## 2018-04-22 ENCOUNTER — Emergency Department (HOSPITAL_COMMUNITY): Payer: Medicare Other

## 2018-04-22 ENCOUNTER — Other Ambulatory Visit: Payer: Self-pay

## 2018-04-22 ENCOUNTER — Emergency Department (HOSPITAL_COMMUNITY)
Admission: EM | Admit: 2018-04-22 | Discharge: 2018-04-22 | Disposition: A | Discharge status: Home / Self Care | Payer: Medicare Other | Attending: Emergency Medicine | Admitting: Emergency Medicine

## 2018-04-22 DIAGNOSIS — Z79899 Other long term (current) drug therapy: Secondary | ICD-10-CM | POA: Diagnosis not present

## 2018-04-22 DIAGNOSIS — R4182 Altered mental status, unspecified: Secondary | ICD-10-CM | POA: Diagnosis not present

## 2018-04-22 DIAGNOSIS — Z794 Long term (current) use of insulin: Secondary | ICD-10-CM | POA: Insufficient documentation

## 2018-04-22 DIAGNOSIS — Z7982 Long term (current) use of aspirin: Secondary | ICD-10-CM | POA: Diagnosis not present

## 2018-04-22 DIAGNOSIS — N186 End stage renal disease: Secondary | ICD-10-CM | POA: Insufficient documentation

## 2018-04-22 DIAGNOSIS — Z992 Dependence on renal dialysis: Secondary | ICD-10-CM | POA: Insufficient documentation

## 2018-04-22 DIAGNOSIS — R251 Tremor, unspecified: Secondary | ICD-10-CM | POA: Diagnosis not present

## 2018-04-22 DIAGNOSIS — I120 Hypertensive chronic kidney disease with stage 5 chronic kidney disease or end stage renal disease: Secondary | ICD-10-CM | POA: Insufficient documentation

## 2018-04-22 DIAGNOSIS — E1122 Type 2 diabetes mellitus with diabetic chronic kidney disease: Secondary | ICD-10-CM | POA: Diagnosis not present

## 2018-04-22 DIAGNOSIS — Z87891 Personal history of nicotine dependence: Secondary | ICD-10-CM | POA: Insufficient documentation

## 2018-04-22 DIAGNOSIS — J40 Bronchitis, not specified as acute or chronic: Secondary | ICD-10-CM | POA: Diagnosis not present

## 2018-04-22 DIAGNOSIS — F419 Anxiety disorder, unspecified: Secondary | ICD-10-CM | POA: Diagnosis present

## 2018-04-22 LAB — CBC WITH DIFFERENTIAL/PLATELET
Abs Immature Granulocytes: 0.03 10*3/uL (ref 0.00–0.07)
Basophils Absolute: 0.1 10*3/uL (ref 0.0–0.1)
Basophils Relative: 1 %
EOS PCT: 1 %
Eosinophils Absolute: 0.1 10*3/uL (ref 0.0–0.5)
HEMATOCRIT: 36.2 % — AB (ref 39.0–52.0)
Hemoglobin: 11.6 g/dL — ABNORMAL LOW (ref 13.0–17.0)
Immature Granulocytes: 1 %
LYMPHS PCT: 10 %
Lymphs Abs: 0.6 10*3/uL — ABNORMAL LOW (ref 0.7–4.0)
MCH: 34.5 pg — AB (ref 26.0–34.0)
MCHC: 32 g/dL (ref 30.0–36.0)
MCV: 107.7 fL — AB (ref 80.0–100.0)
MONO ABS: 0.5 10*3/uL (ref 0.1–1.0)
MONOS PCT: 9 %
Neutro Abs: 4.6 10*3/uL (ref 1.7–7.7)
Neutrophils Relative %: 78 %
Platelets: 161 10*3/uL (ref 150–400)
RBC: 3.36 MIL/uL — AB (ref 4.22–5.81)
RDW: 14.8 % (ref 11.5–15.5)
WBC: 5.9 10*3/uL (ref 4.0–10.5)
nRBC: 0 % (ref 0.0–0.2)

## 2018-04-22 LAB — COMPREHENSIVE METABOLIC PANEL
ALK PHOS: 233 U/L — AB (ref 38–126)
ALT: 38 U/L (ref 0–44)
ANION GAP: 16 — AB (ref 5–15)
AST: 44 U/L — ABNORMAL HIGH (ref 15–41)
Albumin: 3.5 g/dL (ref 3.5–5.0)
BILIRUBIN TOTAL: 1 mg/dL (ref 0.3–1.2)
BUN: 48 mg/dL — ABNORMAL HIGH (ref 8–23)
CALCIUM: 8.8 mg/dL — AB (ref 8.9–10.3)
CO2: 26 mmol/L (ref 22–32)
Chloride: 101 mmol/L (ref 98–111)
Creatinine, Ser: 9.06 mg/dL — ABNORMAL HIGH (ref 0.61–1.24)
GFR, EST AFRICAN AMERICAN: 6 mL/min — AB (ref >60)
GFR, EST NON AFRICAN AMERICAN: 5 mL/min — AB (ref >60)
Glucose, Bld: 143 mg/dL — ABNORMAL HIGH (ref 70–99)
Potassium: 3.5 mmol/L (ref 3.5–5.1)
SODIUM: 143 mmol/L (ref 135–145)
Total Protein: 7 g/dL (ref 6.5–8.1)

## 2018-04-22 LAB — MAGNESIUM: Magnesium: 2.2 mg/dL (ref 1.7–2.4)

## 2018-04-22 LAB — I-STAT CHEM 8, ED
BUN: 42 mg/dL — ABNORMAL HIGH (ref 8–23)
CALCIUM ION: 1 mmol/L — AB (ref 1.15–1.40)
CHLORIDE: 101 mmol/L (ref 98–111)
Creatinine, Ser: 9.8 mg/dL — ABNORMAL HIGH (ref 0.61–1.24)
GLUCOSE: 137 mg/dL — AB (ref 70–99)
HCT: 34 % — ABNORMAL LOW (ref 39.0–52.0)
Hemoglobin: 11.6 g/dL — ABNORMAL LOW (ref 13.0–17.0)
Potassium: 3.4 mmol/L — ABNORMAL LOW (ref 3.5–5.1)
Sodium: 140 mmol/L (ref 135–145)
TCO2: 27 mmol/L (ref 22–32)

## 2018-04-22 LAB — LIPASE, BLOOD: LIPASE: 38 U/L (ref 11–51)

## 2018-04-22 LAB — I-STAT CG4 LACTIC ACID, ED
LACTIC ACID, VENOUS: 2.12 mmol/L — AB (ref 0.5–1.9)
Lactic Acid, Venous: 0.91 mmol/L (ref 0.5–1.9)

## 2018-04-22 LAB — I-STAT TROPONIN, ED: TROPONIN I, POC: 0.01 ng/mL (ref 0.00–0.08)

## 2018-04-22 MED ORDER — LORAZEPAM 2 MG/ML IJ SOLN
0.5000 mg | Freq: Once | INTRAMUSCULAR | Status: AC
  Administered 2018-04-22: 0.5 mg via INTRAVENOUS
  Filled 2018-04-22: qty 1

## 2018-04-22 NOTE — ED Notes (Signed)
Ed provider Tyrone Nine notified patient has a critical lactic acid value of 2.12

## 2018-04-22 NOTE — Discharge Instructions (Signed)
Return to the ED for worsening. Call your transplant doctor and let your kidney doctors know this happened to you.

## 2018-04-22 NOTE — ED Notes (Signed)
Family at bedside. 

## 2018-04-22 NOTE — ED Notes (Signed)
Bed: YI01 Expected date:  Expected time:  Means of arrival:  Comments: Hold for Res B

## 2018-04-22 NOTE — ED Triage Notes (Signed)
We assist him from our waiting room with c/o "seizure". He is awake and flailing his extremities. While this is occurring he is able to answer questions appropriately. He is in no distress. I note a Dexcom cbg reader on his lower abdomen. He tells [place] he is a lung recipient.

## 2018-04-22 NOTE — ED Provider Notes (Signed)
[facility] DEPT Provider Note   CSN: [id number] Arrival date & time: 04/22/18  1209     History   Chief Complaint Chief Complaint  Patient presents with  . Anxiety    HPI [name] is a 67 y.o. male.  67 yo M with a chief complaint of shaking and almost collapsing while at church this morning.  The patient was standing and started shaking and then his wife is able to make it over to him gently lowered him to the ground.  He continued shaking and so was brought to the emergency department.  He had an episode like this happened in the past.  They deny head injury.  Was at his normal state of health prior to the event.  He gets dialysis Monday Wednesday and Friday and has not missed a session.  Denies fevers or chills.  Denies abdominal pain or vomiting.  Denies chest pain or shortness of breath.  The history is provided by the patient and the spouse.  Illness  This is a recurrent problem. The current episode started less than 1 hour ago. The problem occurs constantly. The problem has not changed since onset.Pertinent negatives include no chest pain, no abdominal pain, no headaches and no shortness of breath. Nothing aggravates the symptoms. Nothing relieves the symptoms. He has tried nothing for the symptoms. The treatment provided no relief.    Past Medical History:  Diagnosis Date  . Aortic valve disorders   . Benign neoplasm of colon   . Degeneration of intervertebral disc, site unspecified   . Diabetes mellitus without complication ([place])   . 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 Esophageal reflux   . ESRD (end stage renal disease) on dialysis ([place])   . Essential hypertension   . Obstructive sleep apnea (adult) (pediatric)   . Osteoarthrosis, unspecified whether generalized or localized, unspecified site   . Other and unspecified hyperlipidemia   . Pneumonia    interstitial pneumonia  . Pulmonary fibrosis  ([place])   . Renal disorder   . Respiratory failure with hypoxia ([place]) 12/2015  . Shortness of breath dyspnea   . Transplanted, lung ([place])   . Unspecified essential hypertension     Patient Active Problem List   Diagnosis Date Noted  . Sepsis ([place])   . CKD (chronic kidney disease)   . Generalized weakness 12/30/2015  . ESRD (end stage renal disease) on dialysis ([place])   . 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 Acute respiratory failure with hypoxemia ([place]) 04/18/2015  . Septic shock ([place]) 04/18/2015  . Acute encephalopathy 04/18/2015  . Acute respiratory failure ([place]) 04/18/2015  . Cardiac arrest ([place])   . HCAP (healthcare-associated pneumonia)   . Elevated rheumatoid factor 05/09/2014  . Essential hypertension 05/09/2014  . ILD (interstitial lung disease) ([place]) 05/09/2014  . Obstructive apnea 05/09/2014  . Lung nodule, solitary 04/18/2014  . Awaiting organ transplant 04/18/2014  . Acute on chronic respiratory failure with hypoxia ([place]) 08/15/2013  . Edema 07/05/2013  . Chronic respiratory failure ([place]) 03/03/2013  . Diabetes mellitus with complication ([place]) 01/74/9449  . Acute sinusitis 05/04/2011  . Cough 11/10/2010  . 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 Obstructive sleep apnea 10/09/2008  . ACID REFLUX DISEASE 10/09/2008  . HIATAL HERNIA 10/09/2008  . OSTEOARTHRITIS 10/09/2008    Past Surgical History:  Procedure Laterality Date  . COLONOSCOPY WITH PROPOFOL N/A 11/07/2017   Procedure: COLONOSCOPY WITH PROPOFOL;  Surgeon: Ronnette Juniper, MD;  Location: [facility];  Service: Gastroenterology;  Laterality: N/A;  . LUNG BIOPSY  2010  . LUNG TRANSPLANT, SINGLE Right   . POLYPECTOMY  11/07/2017   Procedure: POLYPECTOMY;  Surgeon: Ronnette Juniper, MD;  Location: [facility];  Service: Gastroenterology;;        Home Medications    Prior to Admission medications   Medication Sig Start Date End Date Taking? Authorizing Provider  ACCU-CHEK FASTCLIX LANCETS MISC As  directed up to 4 times daily 12/16/15  Yes [provider]  acetaminophen (TYLENOL) 500 MG tablet Take 500-1,000 mg by mouth every 6 (six) hours as needed for headache.   Yes [provider]  aspirin 81 MG tablet Take 81 mg by mouth daily.    Yes [provider]  azaTHIOprine (IMURAN) 50 MG tablet Take 1/2 tablet (25 mg) every Monday, Wednesday and Friday after HD. 02/15/17  Yes [provider]  calcium acetate (PHOSLO) 667 MG capsule Take 1,334 mg by mouth 3 (three) times daily before meals.    Yes [provider]  cycloSPORINE modified (NEORAL) 25 MG capsule Take 75 mg by mouth 2 (two) times daily.    Yes [provider]  dorzolamide (TRUSOPT) 2 % ophthalmic solution Place 1 drop into both eyes 2 (two) times daily. 03/31/18  Yes [provider]  fluticasone (FLONASE) 50 MCG/ACT nasal spray Place 2 sprays into the nose daily. Patient taking differently: Place 2 sprays into the nose daily as needed for allergies.  03/05/13  Yes Wenda Low, MD  Immune Globulin 10% (PRIVIGEN) 20 GM/200ML SOLN Inject 0.5 g/kg into the vein every 3 (three) months.    Yes [provider]  insulin regular (NOVOLIN R) 100 units/mL injection Inject 5 Units into the skin 3 (three) times daily before meals. Additional units (per sliding scale): BGL 201-250 = 1 unit; 251-300 = 2 units; 301-350 = 3 units; 351-400 = units; >401 = 5 units + CALL LUNG COORDINATOR @ [facility]   Yes [provider]  Multiple Vitamin (MULTIVITAMIN WITH MINERALS) TABS tablet Take 1 tablet by mouth daily. On Tuesday, Thursday, Saturday and Sunday (non-HD days)   Yes [provider]  omeprazole (PRILOSEC) 20 MG capsule Take 20 mg by mouth 2 (two) times daily before a meal.  07/05/16  Yes [provider]  polyvinyl alcohol-povidone (REFRESH) 1.4-0.6 % ophthalmic solution Place 1-2 drops into both eyes daily as needed (for dryness).   Yes [provider]    pravastatin (PRAVACHOL) 20 MG tablet Take 20 mg by mouth daily.    Yes [provider]  prednisoLONE acetate (PRED FORTE) 1 % ophthalmic suspension Place 1 drop into both eyes 4 (four) times daily. 04/02/18  Yes [provider]  predniSONE (DELTASONE) 5 MG tablet Take 5 mg by mouth daily with breakfast.  08/15/16  Yes [provider]  sertraline (ZOLOFT) 50 MG tablet Take 50 mg by mouth daily.   Yes [provider]  sulfamethoxazole-trimethoprim (BACTRIM,SEPTRA) 400-80 MG tablet Take 1 tablet by mouth 3 (three) times a week. Monday, Wednesday, Friday 02/27/17  Yes [provider]  valGANciclovir (VALCYTE) 450 MG tablet Take 1 tablet by mouth See admin instructions. Every Monday and Friday AFTER DIALYSIS 03/26/15  Yes [provider]    Family History Family History  Problem Relation Age of Onset  . Pancreatic cancer Brother   . Heart disease Father     Social History Social History   Tobacco Use  . Smoking status: Former Smoker  Packs/day: 0.30    Years: 20.00    Pack years: 6.00    Types: Cigarettes    Last attempt to quit: 06/06/2002    Years since quitting: 15.8  . Smokeless tobacco: Never Used  Substance Use Topics  . Alcohol use: No    Alcohol/week: 0.0 standard drinks  . Drug use: No     Allergies   Levofloxacin and Nsaids   Review of Systems Review of Systems  Constitutional: Negative for chills and fever.  HENT: Negative for congestion and facial swelling.   Eyes: Negative for discharge and visual disturbance.  Respiratory: Negative for shortness of breath.   Cardiovascular: Negative for chest pain and palpitations.  Gastrointestinal: Negative for abdominal pain, diarrhea and vomiting.  Musculoskeletal: Negative for arthralgias and myalgias.  Skin: Negative for color change and rash.  Neurological: Positive for tremors. Negative for syncope and headaches.  Psychiatric/Behavioral: Negative for confusion and  dysphoric mood.     Physical Exam Updated Vital Signs BP (!) 141/74   Pulse 75   Temp 98.4 F (36.9 C) (Oral)   Resp (!) 22   Ht 5' 3.5" (1.613 m)   Wt 68 kg   SpO2 98%   BMI 26.15 kg/m   Physical Exam  Constitutional: He is oriented to person, place, and time. He appears well-developed and well-nourished.  Diffuse tremors, will answer questions, slow to respond  HENT:  Head: Normocephalic and atraumatic.  Eyes: Pupils are equal, round, and reactive to light. EOM are normal.  Neck: Normal range of motion. Neck supple. No JVD present.  Cardiovascular: Normal rate and regular rhythm. Exam reveals no gallop and no friction rub.  No murmur heard. Pulmonary/Chest: No respiratory distress. He has no wheezes.  Abdominal: He exhibits no distension. There is no rebound and no guarding.  Musculoskeletal: Normal range of motion.  Neurological: He is alert and oriented to person, place, and time.  Skin: No rash noted. No pallor.  Psychiatric: He has a normal mood and affect. His behavior is normal.  Nursing note and vitals reviewed.    ED Treatments / Results  Labs (all labs ordered are listed, but only abnormal results are displayed) Labs Reviewed  CBC WITH DIFFERENTIAL/PLATELET - Abnormal; Notable for the following components:      Result Value   RBC 3.36 (*)    Hemoglobin 11.6 (*)    HCT 36.2 (*)    MCV 107.7 (*)    MCH 34.5 (*)    Lymphs Abs 0.6 (*)    All other components within normal limits  COMPREHENSIVE METABOLIC PANEL - Abnormal; Notable for the following components:   Glucose, Bld 143 (*)    BUN 48 (*)    Creatinine, Ser 9.06 (*)    Calcium 8.8 (*)    AST 44 (*)    Alkaline Phosphatase 233 (*)    GFR calc non Af Amer 5 (*)    GFR calc Af Amer 6 (*)    Anion gap 16 (*)    All other components within normal limits  I-STAT CHEM 8, ED - Abnormal; Notable for the following components:   Potassium 3.4 (*)    BUN 42 (*)    Creatinine, Ser 9.80 (*)    Glucose,  Bld 137 (*)    Calcium, Ion 1.00 (*)    Hemoglobin 11.6 (*)    HCT 34.0 (*)    All other components within normal limits  I-STAT CG4 LACTIC ACID, ED - Abnormal; Notable for the  following components:   Lactic Acid, Venous 2.12 (*)    All other components within normal limits  LIPASE, BLOOD  MAGNESIUM  I-STAT TROPONIN, ED  I-STAT CG4 LACTIC ACID, ED    EKG EKG Interpretation  Date/Time:  Sunday April 22 2018 12:13:16 EST Ventricular Rate:  82 PR Interval:    QRS Duration: 94 QT Interval:  414 QTC Calculation: 481 R Axis:   5 Text Interpretation:  Sinus rhythm Abnormal R-wave progression, early transition Minimal ST depression, anterolateral leads Borderline prolonged QT interval TECHNICALLY DIFFICULT Confirmed by ,  ([id number]) on 04/22/2018 12:28:10 PM   Radiology Ct Head Wo Contrast  Result Date: 04/22/2018 CLINICAL DATA:  Altered mental status. EXAM: CT HEAD WITHOUT CONTRAST TECHNIQUE: Contiguous axial images were obtained from the base of the skull through the vertex without intravenous contrast. COMPARISON:  None. FINDINGS: Brain: No evidence of acute infarction, hemorrhage, hydrocephalus, extra-axial collection or mass lesion/mass effect. Mild diffuse age related cerebral atrophy. Vascular: Calcified atherosclerosis at the skullbase. No hyperdense vessel. Skull: Normal. Negative for fracture or focal lesion. Sinuses/Orbits: No acute finding. Other: None. IMPRESSION: 1.  No acute intracranial abnormality. Electronically Signed   By: William T Derry M.D.   On: 04/22/2018 13:47   Dg Chest Port 1 View  Result Date: 04/22/2018 CLINICAL DATA:  Weakness EXAM: PORTABLE CHEST 1 VIEW COMPARISON:  03/28/2018 FINDINGS: Cardiac shadow is stable. Aortic calcifications are again seen. Venous stenting on the left is noted. The right lung is clear with changes of prior transplantation. Diffuse chronic fibrosis is noted in the left lung stable from the prior exam. IMPRESSION: Chronic  changes in the left lung.  No acute abnormality noted. Electronically Signed   By: Mark  Lukens M.D.   On: 04/22/2018 12:56    Procedures Procedures (including critical care time)  Medications Ordered in ED Medications  LORazepam (ATIVAN) injection 0.5 mg (0.5 mg Intravenous Given 04/22/18 1244)     Initial Impression / Assessment and Plan / ED Course  I have reviewed the triage vital signs and the nursing notes.  Pertinent labs & imaging results that were available during my care of the patient were reviewed by me and considered in my medical decision making (see chart for details).     67  yo M with a chief complaint of generalized shaking.  This happened while he was at church earlier today.  Family stated he was in his normal state of health prior to.  Patient denies any complaints other than the tremor.  He is not sure what is wrong with him.  Initial laboratory evaluation without concerning etiology, troponins negative lactate is barely over 2.  Potassium is 3.4.  Chest x-ray without focal opacity.  Patient is reassessed and is back to normal.  Family feels that he is at his baseline.  They are requesting discharge home.  We will have them contact their transplant physicians as well as the nephrologist.  3:23 PM:  I have discussed the diagnosis/risks/treatment options with the patient and family and believe the pt to be eligible for discharge home to follow-up with PCP. We also discussed returning to the ED immediately if new or worsening sx occur. We discussed the sx which are most concerning (e.g., sudden worsening pain, fever, inability to tolerate by mouth) that necessitate immediate return. Medications administered to the patient during their visit and any new prescriptions provided to the patient are listed below.  Medications given during this visit Medications  LORazepam (ATIVAN) injection 0.5 mg (0.5 mg  Intravenous Given 04/22/18 1244)      The patient appears reasonably  screen and/or stabilized for discharge and I doubt any other medical condition or other [facility] requiring further screening, evaluation, or treatment in the ED at this time prior to discharge.    Final Clinical Impressions(s) / ED Diagnoses   Final diagnoses:  Tremor    ED Discharge Orders         Ordered    Ambulatory referral to Neurology    Comments:  New tremor   04/22/18 [place], DO 04/22/18 1523

## 2018-04-23 DIAGNOSIS — N186 End stage renal disease: Secondary | ICD-10-CM | POA: Diagnosis not present

## 2018-04-23 DIAGNOSIS — E876 Hypokalemia: Secondary | ICD-10-CM | POA: Diagnosis not present

## 2018-04-23 DIAGNOSIS — N2581 Secondary hyperparathyroidism of renal origin: Secondary | ICD-10-CM | POA: Diagnosis not present

## 2018-04-23 DIAGNOSIS — T8579XA Infection and inflammatory reaction due to other internal prosthetic devices, implants and grafts, initial encounter: Secondary | ICD-10-CM | POA: Diagnosis not present

## 2018-04-23 LAB — CBG MONITORING, ED: Glucose-Capillary: 151 mg/dL — ABNORMAL HIGH (ref 70–99)

## 2018-04-25 DIAGNOSIS — R4182 Altered mental status, unspecified: Secondary | ICD-10-CM | POA: Diagnosis not present

## 2018-04-25 DIAGNOSIS — B259 Cytomegaloviral disease, unspecified: Secondary | ICD-10-CM | POA: Diagnosis not present

## 2018-04-25 DIAGNOSIS — T8579XA Infection and inflammatory reaction due to other internal prosthetic devices, implants and grafts, initial encounter: Secondary | ICD-10-CM | POA: Diagnosis not present

## 2018-04-25 DIAGNOSIS — Z942 Lung transplant status: Secondary | ICD-10-CM | POA: Diagnosis not present

## 2018-04-25 DIAGNOSIS — N186 End stage renal disease: Secondary | ICD-10-CM | POA: Diagnosis not present

## 2018-04-25 DIAGNOSIS — R55 Syncope and collapse: Secondary | ICD-10-CM | POA: Diagnosis not present

## 2018-04-25 DIAGNOSIS — T86810 Lung transplant rejection: Secondary | ICD-10-CM | POA: Diagnosis not present

## 2018-04-25 DIAGNOSIS — E0801 Diabetes mellitus due to underlying condition with hyperosmolarity with coma: Secondary | ICD-10-CM | POA: Diagnosis not present

## 2018-04-25 DIAGNOSIS — R251 Tremor, unspecified: Secondary | ICD-10-CM | POA: Diagnosis not present

## 2018-04-25 DIAGNOSIS — E1100 Type 2 diabetes mellitus with hyperosmolarity without nonketotic hyperglycemic-hyperosmolar coma (NKHHC): Secondary | ICD-10-CM | POA: Diagnosis not present

## 2018-04-25 DIAGNOSIS — E876 Hypokalemia: Secondary | ICD-10-CM | POA: Diagnosis not present

## 2018-04-25 DIAGNOSIS — R200 Anesthesia of skin: Secondary | ICD-10-CM | POA: Diagnosis not present

## 2018-04-25 DIAGNOSIS — G253 Myoclonus: Secondary | ICD-10-CM | POA: Diagnosis not present

## 2018-04-25 DIAGNOSIS — J8410 Pulmonary fibrosis, unspecified: Secondary | ICD-10-CM | POA: Diagnosis not present

## 2018-04-25 DIAGNOSIS — Z992 Dependence on renal dialysis: Secondary | ICD-10-CM | POA: Diagnosis not present

## 2018-04-25 DIAGNOSIS — J9610 Chronic respiratory failure, unspecified whether with hypoxia or hypercapnia: Secondary | ICD-10-CM | POA: Diagnosis not present

## 2018-04-25 DIAGNOSIS — I120 Hypertensive chronic kidney disease with stage 5 chronic kidney disease or end stage renal disease: Secondary | ICD-10-CM | POA: Diagnosis not present

## 2018-04-25 DIAGNOSIS — R918 Other nonspecific abnormal finding of lung field: Secondary | ICD-10-CM | POA: Diagnosis not present

## 2018-04-25 DIAGNOSIS — M549 Dorsalgia, unspecified: Secondary | ICD-10-CM | POA: Diagnosis not present

## 2018-04-25 DIAGNOSIS — N2581 Secondary hyperparathyroidism of renal origin: Secondary | ICD-10-CM | POA: Diagnosis not present

## 2018-04-25 DIAGNOSIS — Z87891 Personal history of nicotine dependence: Secondary | ICD-10-CM | POA: Diagnosis not present

## 2018-04-26 DIAGNOSIS — Z942 Lung transplant status: Secondary | ICD-10-CM | POA: Diagnosis not present

## 2018-04-26 DIAGNOSIS — I951 Orthostatic hypotension: Secondary | ICD-10-CM | POA: Diagnosis present

## 2018-04-26 DIAGNOSIS — D899 Disorder involving the immune mechanism, unspecified: Secondary | ICD-10-CM | POA: Diagnosis not present

## 2018-04-26 DIAGNOSIS — R55 Syncope and collapse: Secondary | ICD-10-CM | POA: Diagnosis not present

## 2018-04-26 DIAGNOSIS — E1142 Type 2 diabetes mellitus with diabetic polyneuropathy: Secondary | ICD-10-CM | POA: Diagnosis present

## 2018-04-26 DIAGNOSIS — Z794 Long term (current) use of insulin: Secondary | ICD-10-CM | POA: Diagnosis not present

## 2018-04-26 DIAGNOSIS — J9610 Chronic respiratory failure, unspecified whether with hypoxia or hypercapnia: Secondary | ICD-10-CM | POA: Diagnosis present

## 2018-04-26 DIAGNOSIS — R270 Ataxia, unspecified: Secondary | ICD-10-CM | POA: Diagnosis present

## 2018-04-26 DIAGNOSIS — K219 Gastro-esophageal reflux disease without esophagitis: Secondary | ICD-10-CM | POA: Diagnosis present

## 2018-04-26 DIAGNOSIS — Z992 Dependence on renal dialysis: Secondary | ICD-10-CM | POA: Diagnosis not present

## 2018-04-26 DIAGNOSIS — Z87891 Personal history of nicotine dependence: Secondary | ICD-10-CM | POA: Diagnosis not present

## 2018-04-26 DIAGNOSIS — N186 End stage renal disease: Secondary | ICD-10-CM | POA: Diagnosis present

## 2018-04-26 DIAGNOSIS — B259 Cytomegaloviral disease, unspecified: Secondary | ICD-10-CM | POA: Diagnosis present

## 2018-04-26 DIAGNOSIS — Z7982 Long term (current) use of aspirin: Secondary | ICD-10-CM | POA: Diagnosis not present

## 2018-04-26 DIAGNOSIS — R251 Tremor, unspecified: Secondary | ICD-10-CM | POA: Diagnosis present

## 2018-04-26 DIAGNOSIS — T86810 Lung transplant rejection: Secondary | ICD-10-CM | POA: Diagnosis present

## 2018-04-26 DIAGNOSIS — D631 Anemia in chronic kidney disease: Secondary | ICD-10-CM | POA: Diagnosis present

## 2018-04-26 DIAGNOSIS — I120 Hypertensive chronic kidney disease with stage 5 chronic kidney disease or end stage renal disease: Secondary | ICD-10-CM | POA: Diagnosis present

## 2018-04-26 DIAGNOSIS — G4733 Obstructive sleep apnea (adult) (pediatric): Secondary | ICD-10-CM | POA: Diagnosis present

## 2018-04-26 DIAGNOSIS — F329 Major depressive disorder, single episode, unspecified: Secondary | ICD-10-CM | POA: Diagnosis present

## 2018-04-26 DIAGNOSIS — G253 Myoclonus: Secondary | ICD-10-CM | POA: Diagnosis not present

## 2018-04-26 DIAGNOSIS — N250 Renal osteodystrophy: Secondary | ICD-10-CM | POA: Diagnosis present

## 2018-04-26 DIAGNOSIS — E0801 Diabetes mellitus due to underlying condition with hyperosmolarity with coma: Secondary | ICD-10-CM | POA: Diagnosis not present

## 2018-04-26 DIAGNOSIS — Z79899 Other long term (current) drug therapy: Secondary | ICD-10-CM | POA: Diagnosis not present

## 2018-04-26 DIAGNOSIS — R531 Weakness: Secondary | ICD-10-CM | POA: Diagnosis present

## 2018-04-26 DIAGNOSIS — E1122 Type 2 diabetes mellitus with diabetic chronic kidney disease: Secondary | ICD-10-CM | POA: Diagnosis present

## 2018-04-26 DIAGNOSIS — E785 Hyperlipidemia, unspecified: Secondary | ICD-10-CM | POA: Diagnosis present

## 2018-05-04 DIAGNOSIS — N186 End stage renal disease: Secondary | ICD-10-CM | POA: Diagnosis not present

## 2018-05-04 DIAGNOSIS — E876 Hypokalemia: Secondary | ICD-10-CM | POA: Diagnosis not present

## 2018-05-04 DIAGNOSIS — N2581 Secondary hyperparathyroidism of renal origin: Secondary | ICD-10-CM | POA: Diagnosis not present

## 2018-05-04 DIAGNOSIS — T8579XA Infection and inflammatory reaction due to other internal prosthetic devices, implants and grafts, initial encounter: Secondary | ICD-10-CM | POA: Diagnosis not present

## 2018-05-06 DIAGNOSIS — N186 End stage renal disease: Secondary | ICD-10-CM | POA: Diagnosis not present

## 2018-05-06 DIAGNOSIS — Z992 Dependence on renal dialysis: Secondary | ICD-10-CM | POA: Diagnosis not present

## 2018-05-06 DIAGNOSIS — T86819 Unspecified complication of lung transplant: Secondary | ICD-10-CM | POA: Diagnosis not present

## 2018-05-07 DIAGNOSIS — N2581 Secondary hyperparathyroidism of renal origin: Secondary | ICD-10-CM | POA: Diagnosis not present

## 2018-05-07 DIAGNOSIS — N186 End stage renal disease: Secondary | ICD-10-CM | POA: Diagnosis not present

## 2018-05-07 DIAGNOSIS — E876 Hypokalemia: Secondary | ICD-10-CM | POA: Diagnosis not present

## 2018-05-07 DIAGNOSIS — T8579XA Infection and inflammatory reaction due to other internal prosthetic devices, implants and grafts, initial encounter: Secondary | ICD-10-CM | POA: Diagnosis not present

## 2018-05-08 ENCOUNTER — Encounter: Payer: 59 | Admitting: Vascular Surgery

## 2018-05-08 DIAGNOSIS — Z8711 Personal history of peptic ulcer disease: Secondary | ICD-10-CM | POA: Diagnosis not present

## 2018-05-08 DIAGNOSIS — E1140 Type 2 diabetes mellitus with diabetic neuropathy, unspecified: Secondary | ICD-10-CM | POA: Diagnosis not present

## 2018-05-08 DIAGNOSIS — G8929 Other chronic pain: Secondary | ICD-10-CM | POA: Diagnosis not present

## 2018-05-08 DIAGNOSIS — N186 End stage renal disease: Secondary | ICD-10-CM | POA: Diagnosis not present

## 2018-05-08 DIAGNOSIS — Z942 Lung transplant status: Secondary | ICD-10-CM | POA: Diagnosis not present

## 2018-05-08 DIAGNOSIS — Z4824 Encounter for aftercare following lung transplant: Secondary | ICD-10-CM | POA: Diagnosis not present

## 2018-05-08 DIAGNOSIS — R0602 Shortness of breath: Secondary | ICD-10-CM | POA: Diagnosis not present

## 2018-05-08 DIAGNOSIS — D899 Disorder involving the immune mechanism, unspecified: Secondary | ICD-10-CM | POA: Diagnosis not present

## 2018-05-08 DIAGNOSIS — E1122 Type 2 diabetes mellitus with diabetic chronic kidney disease: Secondary | ICD-10-CM | POA: Diagnosis not present

## 2018-05-08 DIAGNOSIS — Z794 Long term (current) use of insulin: Secondary | ICD-10-CM | POA: Diagnosis not present

## 2018-05-08 DIAGNOSIS — M5431 Sciatica, right side: Secondary | ICD-10-CM | POA: Diagnosis not present

## 2018-05-08 DIAGNOSIS — I120 Hypertensive chronic kidney disease with stage 5 chronic kidney disease or end stage renal disease: Secondary | ICD-10-CM | POA: Diagnosis not present

## 2018-05-08 DIAGNOSIS — M5416 Radiculopathy, lumbar region: Secondary | ICD-10-CM | POA: Diagnosis not present

## 2018-05-08 DIAGNOSIS — Z5181 Encounter for therapeutic drug level monitoring: Secondary | ICD-10-CM | POA: Diagnosis not present

## 2018-05-08 DIAGNOSIS — Z79899 Other long term (current) drug therapy: Secondary | ICD-10-CM | POA: Diagnosis not present

## 2018-05-08 DIAGNOSIS — Z992 Dependence on renal dialysis: Secondary | ICD-10-CM | POA: Diagnosis not present

## 2018-05-08 DIAGNOSIS — M5441 Lumbago with sciatica, right side: Secondary | ICD-10-CM | POA: Diagnosis not present

## 2018-05-08 DIAGNOSIS — R251 Tremor, unspecified: Secondary | ICD-10-CM | POA: Diagnosis not present

## 2018-05-08 DIAGNOSIS — R918 Other nonspecific abnormal finding of lung field: Secondary | ICD-10-CM | POA: Diagnosis not present

## 2018-05-09 DIAGNOSIS — E876 Hypokalemia: Secondary | ICD-10-CM | POA: Diagnosis not present

## 2018-05-09 DIAGNOSIS — T8579XA Infection and inflammatory reaction due to other internal prosthetic devices, implants and grafts, initial encounter: Secondary | ICD-10-CM | POA: Diagnosis not present

## 2018-05-09 DIAGNOSIS — N2581 Secondary hyperparathyroidism of renal origin: Secondary | ICD-10-CM | POA: Diagnosis not present

## 2018-05-09 DIAGNOSIS — N186 End stage renal disease: Secondary | ICD-10-CM | POA: Diagnosis not present

## 2018-05-10 DIAGNOSIS — R251 Tremor, unspecified: Secondary | ICD-10-CM | POA: Diagnosis not present

## 2018-05-11 DIAGNOSIS — N186 End stage renal disease: Secondary | ICD-10-CM | POA: Diagnosis not present

## 2018-05-11 DIAGNOSIS — T8579XA Infection and inflammatory reaction due to other internal prosthetic devices, implants and grafts, initial encounter: Secondary | ICD-10-CM | POA: Diagnosis not present

## 2018-05-11 DIAGNOSIS — N2581 Secondary hyperparathyroidism of renal origin: Secondary | ICD-10-CM | POA: Diagnosis not present

## 2018-05-11 DIAGNOSIS — E876 Hypokalemia: Secondary | ICD-10-CM | POA: Diagnosis not present

## 2018-05-14 DIAGNOSIS — G8929 Other chronic pain: Secondary | ICD-10-CM | POA: Diagnosis not present

## 2018-05-14 DIAGNOSIS — T8579XA Infection and inflammatory reaction due to other internal prosthetic devices, implants and grafts, initial encounter: Secondary | ICD-10-CM | POA: Diagnosis not present

## 2018-05-14 DIAGNOSIS — E876 Hypokalemia: Secondary | ICD-10-CM | POA: Diagnosis not present

## 2018-05-14 DIAGNOSIS — M5441 Lumbago with sciatica, right side: Secondary | ICD-10-CM | POA: Diagnosis not present

## 2018-05-14 DIAGNOSIS — M5126 Other intervertebral disc displacement, lumbar region: Secondary | ICD-10-CM | POA: Diagnosis not present

## 2018-05-14 DIAGNOSIS — N2581 Secondary hyperparathyroidism of renal origin: Secondary | ICD-10-CM | POA: Diagnosis not present

## 2018-05-14 DIAGNOSIS — N186 End stage renal disease: Secondary | ICD-10-CM | POA: Diagnosis not present

## 2018-05-14 DIAGNOSIS — M47816 Spondylosis without myelopathy or radiculopathy, lumbar region: Secondary | ICD-10-CM | POA: Diagnosis not present

## 2018-05-15 DIAGNOSIS — E1122 Type 2 diabetes mellitus with diabetic chronic kidney disease: Secondary | ICD-10-CM | POA: Diagnosis not present

## 2018-05-15 DIAGNOSIS — E1169 Type 2 diabetes mellitus with other specified complication: Secondary | ICD-10-CM | POA: Diagnosis not present

## 2018-05-15 DIAGNOSIS — Z713 Dietary counseling and surveillance: Secondary | ICD-10-CM | POA: Diagnosis not present

## 2018-05-15 DIAGNOSIS — Z992 Dependence on renal dialysis: Secondary | ICD-10-CM | POA: Diagnosis not present

## 2018-05-15 DIAGNOSIS — Z942 Lung transplant status: Secondary | ICD-10-CM | POA: Diagnosis not present

## 2018-05-15 DIAGNOSIS — Z794 Long term (current) use of insulin: Secondary | ICD-10-CM | POA: Diagnosis not present

## 2018-05-15 DIAGNOSIS — N186 End stage renal disease: Secondary | ICD-10-CM | POA: Diagnosis not present

## 2018-05-15 DIAGNOSIS — Z87891 Personal history of nicotine dependence: Secondary | ICD-10-CM | POA: Diagnosis not present

## 2018-05-15 DIAGNOSIS — Z4824 Encounter for aftercare following lung transplant: Secondary | ICD-10-CM | POA: Diagnosis not present

## 2018-05-15 DIAGNOSIS — E11649 Type 2 diabetes mellitus with hypoglycemia without coma: Secondary | ICD-10-CM | POA: Diagnosis not present

## 2018-05-16 DIAGNOSIS — T8579XA Infection and inflammatory reaction due to other internal prosthetic devices, implants and grafts, initial encounter: Secondary | ICD-10-CM | POA: Diagnosis not present

## 2018-05-16 DIAGNOSIS — N2581 Secondary hyperparathyroidism of renal origin: Secondary | ICD-10-CM | POA: Diagnosis not present

## 2018-05-16 DIAGNOSIS — E876 Hypokalemia: Secondary | ICD-10-CM | POA: Diagnosis not present

## 2018-05-16 DIAGNOSIS — N186 End stage renal disease: Secondary | ICD-10-CM | POA: Diagnosis not present

## 2018-05-17 DIAGNOSIS — Z79899 Other long term (current) drug therapy: Secondary | ICD-10-CM | POA: Diagnosis not present

## 2018-05-17 DIAGNOSIS — N19 Unspecified kidney failure: Secondary | ICD-10-CM | POA: Diagnosis not present

## 2018-05-17 DIAGNOSIS — Z5181 Encounter for therapeutic drug level monitoring: Secondary | ICD-10-CM | POA: Diagnosis not present

## 2018-05-17 DIAGNOSIS — Z298 Encounter for other specified prophylactic measures: Secondary | ICD-10-CM | POA: Diagnosis not present

## 2018-05-17 DIAGNOSIS — T86819 Unspecified complication of lung transplant: Secondary | ICD-10-CM | POA: Diagnosis not present

## 2018-05-17 DIAGNOSIS — Z942 Lung transplant status: Secondary | ICD-10-CM | POA: Diagnosis not present

## 2018-05-18 DIAGNOSIS — N2581 Secondary hyperparathyroidism of renal origin: Secondary | ICD-10-CM | POA: Diagnosis not present

## 2018-05-18 DIAGNOSIS — N186 End stage renal disease: Secondary | ICD-10-CM | POA: Diagnosis not present

## 2018-05-18 DIAGNOSIS — E876 Hypokalemia: Secondary | ICD-10-CM | POA: Diagnosis not present

## 2018-05-18 DIAGNOSIS — T8579XA Infection and inflammatory reaction due to other internal prosthetic devices, implants and grafts, initial encounter: Secondary | ICD-10-CM | POA: Diagnosis not present

## 2018-05-21 DIAGNOSIS — N2581 Secondary hyperparathyroidism of renal origin: Secondary | ICD-10-CM | POA: Diagnosis not present

## 2018-05-21 DIAGNOSIS — N186 End stage renal disease: Secondary | ICD-10-CM | POA: Diagnosis not present

## 2018-05-21 DIAGNOSIS — E876 Hypokalemia: Secondary | ICD-10-CM | POA: Diagnosis not present

## 2018-05-21 DIAGNOSIS — T8579XA Infection and inflammatory reaction due to other internal prosthetic devices, implants and grafts, initial encounter: Secondary | ICD-10-CM | POA: Diagnosis not present

## 2018-05-23 DIAGNOSIS — E876 Hypokalemia: Secondary | ICD-10-CM | POA: Diagnosis not present

## 2018-05-23 DIAGNOSIS — N2581 Secondary hyperparathyroidism of renal origin: Secondary | ICD-10-CM | POA: Diagnosis not present

## 2018-05-23 DIAGNOSIS — T8579XA Infection and inflammatory reaction due to other internal prosthetic devices, implants and grafts, initial encounter: Secondary | ICD-10-CM | POA: Diagnosis not present

## 2018-05-23 DIAGNOSIS — N186 End stage renal disease: Secondary | ICD-10-CM | POA: Diagnosis not present

## 2018-05-24 DIAGNOSIS — M5416 Radiculopathy, lumbar region: Secondary | ICD-10-CM | POA: Diagnosis not present

## 2018-05-24 DIAGNOSIS — M5137 Other intervertebral disc degeneration, lumbosacral region: Secondary | ICD-10-CM | POA: Diagnosis not present

## 2018-05-24 DIAGNOSIS — H11829 Conjunctivochalasis, unspecified eye: Secondary | ICD-10-CM | POA: Diagnosis not present

## 2018-05-24 DIAGNOSIS — H04203 Unspecified epiphora, bilateral lacrimal glands: Secondary | ICD-10-CM | POA: Diagnosis not present

## 2018-05-24 DIAGNOSIS — M545 Low back pain: Secondary | ICD-10-CM | POA: Diagnosis not present

## 2018-05-24 DIAGNOSIS — H40043 Steroid responder, bilateral: Secondary | ICD-10-CM | POA: Diagnosis not present

## 2018-05-24 DIAGNOSIS — M5126 Other intervertebral disc displacement, lumbar region: Secondary | ICD-10-CM | POA: Diagnosis not present

## 2018-05-25 DIAGNOSIS — N2581 Secondary hyperparathyroidism of renal origin: Secondary | ICD-10-CM | POA: Diagnosis not present

## 2018-05-25 DIAGNOSIS — T8579XA Infection and inflammatory reaction due to other internal prosthetic devices, implants and grafts, initial encounter: Secondary | ICD-10-CM | POA: Diagnosis not present

## 2018-05-25 DIAGNOSIS — E876 Hypokalemia: Secondary | ICD-10-CM | POA: Diagnosis not present

## 2018-05-25 DIAGNOSIS — N186 End stage renal disease: Secondary | ICD-10-CM | POA: Diagnosis not present

## 2018-05-27 DIAGNOSIS — E876 Hypokalemia: Secondary | ICD-10-CM | POA: Diagnosis not present

## 2018-05-27 DIAGNOSIS — N2581 Secondary hyperparathyroidism of renal origin: Secondary | ICD-10-CM | POA: Diagnosis not present

## 2018-05-27 DIAGNOSIS — T8579XA Infection and inflammatory reaction due to other internal prosthetic devices, implants and grafts, initial encounter: Secondary | ICD-10-CM | POA: Diagnosis not present

## 2018-05-27 DIAGNOSIS — N186 End stage renal disease: Secondary | ICD-10-CM | POA: Diagnosis not present

## 2018-05-29 DIAGNOSIS — E876 Hypokalemia: Secondary | ICD-10-CM | POA: Diagnosis not present

## 2018-05-29 DIAGNOSIS — N186 End stage renal disease: Secondary | ICD-10-CM | POA: Diagnosis not present

## 2018-05-29 DIAGNOSIS — T8579XA Infection and inflammatory reaction due to other internal prosthetic devices, implants and grafts, initial encounter: Secondary | ICD-10-CM | POA: Diagnosis not present

## 2018-05-29 DIAGNOSIS — N2581 Secondary hyperparathyroidism of renal origin: Secondary | ICD-10-CM | POA: Diagnosis not present

## 2018-06-01 DIAGNOSIS — N2581 Secondary hyperparathyroidism of renal origin: Secondary | ICD-10-CM | POA: Diagnosis not present

## 2018-06-01 DIAGNOSIS — E876 Hypokalemia: Secondary | ICD-10-CM | POA: Diagnosis not present

## 2018-06-01 DIAGNOSIS — N186 End stage renal disease: Secondary | ICD-10-CM | POA: Diagnosis not present

## 2018-06-01 DIAGNOSIS — T8579XA Infection and inflammatory reaction due to other internal prosthetic devices, implants and grafts, initial encounter: Secondary | ICD-10-CM | POA: Diagnosis not present

## 2018-06-03 DIAGNOSIS — N186 End stage renal disease: Secondary | ICD-10-CM | POA: Diagnosis not present

## 2018-06-03 DIAGNOSIS — N2581 Secondary hyperparathyroidism of renal origin: Secondary | ICD-10-CM | POA: Diagnosis not present

## 2018-06-03 DIAGNOSIS — E876 Hypokalemia: Secondary | ICD-10-CM | POA: Diagnosis not present

## 2018-06-03 DIAGNOSIS — T8579XA Infection and inflammatory reaction due to other internal prosthetic devices, implants and grafts, initial encounter: Secondary | ICD-10-CM | POA: Diagnosis not present

## 2018-06-04 DIAGNOSIS — G609 Hereditary and idiopathic neuropathy, unspecified: Secondary | ICD-10-CM | POA: Diagnosis not present

## 2018-06-04 DIAGNOSIS — F445 Conversion disorder with seizures or convulsions: Secondary | ICD-10-CM | POA: Diagnosis not present

## 2018-06-05 DIAGNOSIS — E876 Hypokalemia: Secondary | ICD-10-CM | POA: Diagnosis not present

## 2018-06-05 DIAGNOSIS — N186 End stage renal disease: Secondary | ICD-10-CM | POA: Diagnosis not present

## 2018-06-05 DIAGNOSIS — T8579XA Infection and inflammatory reaction due to other internal prosthetic devices, implants and grafts, initial encounter: Secondary | ICD-10-CM | POA: Diagnosis not present

## 2018-06-05 DIAGNOSIS — N2581 Secondary hyperparathyroidism of renal origin: Secondary | ICD-10-CM | POA: Diagnosis not present

## 2018-06-06 DIAGNOSIS — N186 End stage renal disease: Secondary | ICD-10-CM | POA: Diagnosis not present

## 2018-06-06 DIAGNOSIS — T86819 Unspecified complication of lung transplant: Secondary | ICD-10-CM | POA: Diagnosis not present

## 2018-06-06 DIAGNOSIS — Z992 Dependence on renal dialysis: Secondary | ICD-10-CM | POA: Diagnosis not present

## 2018-06-07 DIAGNOSIS — Z79899 Other long term (current) drug therapy: Secondary | ICD-10-CM | POA: Diagnosis not present

## 2018-06-07 DIAGNOSIS — T86819 Unspecified complication of lung transplant: Secondary | ICD-10-CM | POA: Diagnosis not present

## 2018-06-07 DIAGNOSIS — Z298 Encounter for other specified prophylactic measures: Secondary | ICD-10-CM | POA: Diagnosis not present

## 2018-06-07 DIAGNOSIS — Z942 Lung transplant status: Secondary | ICD-10-CM | POA: Diagnosis not present

## 2018-06-07 DIAGNOSIS — Z5181 Encounter for therapeutic drug level monitoring: Secondary | ICD-10-CM | POA: Diagnosis not present

## 2018-06-07 DIAGNOSIS — N19 Unspecified kidney failure: Secondary | ICD-10-CM | POA: Diagnosis not present

## 2018-06-08 DIAGNOSIS — T8579XA Infection and inflammatory reaction due to other internal prosthetic devices, implants and grafts, initial encounter: Secondary | ICD-10-CM | POA: Diagnosis not present

## 2018-06-08 DIAGNOSIS — N2581 Secondary hyperparathyroidism of renal origin: Secondary | ICD-10-CM | POA: Diagnosis not present

## 2018-06-08 DIAGNOSIS — N186 End stage renal disease: Secondary | ICD-10-CM | POA: Diagnosis not present

## 2018-06-08 DIAGNOSIS — E876 Hypokalemia: Secondary | ICD-10-CM | POA: Diagnosis not present

## 2018-06-11 DIAGNOSIS — N186 End stage renal disease: Secondary | ICD-10-CM | POA: Diagnosis not present

## 2018-06-11 DIAGNOSIS — T8579XA Infection and inflammatory reaction due to other internal prosthetic devices, implants and grafts, initial encounter: Secondary | ICD-10-CM | POA: Diagnosis not present

## 2018-06-11 DIAGNOSIS — E876 Hypokalemia: Secondary | ICD-10-CM | POA: Diagnosis not present

## 2018-06-11 DIAGNOSIS — N2581 Secondary hyperparathyroidism of renal origin: Secondary | ICD-10-CM | POA: Diagnosis not present

## 2018-06-12 ENCOUNTER — Encounter: Payer: Self-pay | Admitting: Vascular Surgery

## 2018-06-12 ENCOUNTER — Other Ambulatory Visit: Payer: Self-pay

## 2018-06-12 ENCOUNTER — Ambulatory Visit (INDEPENDENT_AMBULATORY_CARE_PROVIDER_SITE_OTHER): Payer: Medicare Other | Admitting: Vascular Surgery

## 2018-06-12 VITALS — BP 125/76 | HR 80 | Temp 98.0°F | Resp 20 | Ht 63.5 in | Wt 150.0 lb

## 2018-06-12 DIAGNOSIS — N186 End stage renal disease: Secondary | ICD-10-CM | POA: Diagnosis not present

## 2018-06-12 DIAGNOSIS — Z992 Dependence on renal dialysis: Secondary | ICD-10-CM | POA: Diagnosis not present

## 2018-06-12 NOTE — Progress Notes (Signed)
Vascular and Vein Specialist of [facility]  Patient name: [name] MRN: [id number] DOB: Nov 08, 1950 Sex: male  REASON FOR CONSULT: Evaluation of left upper arm AV fistula  HPI: [name] is a 68 y.o. male, who is here today for evaluation of left upper arm AV fistula.  He is here today with his wife.  He had this fistula creation at [facility] in 2017 by their history.  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  According to his wife he has had 2 episodes of stenting of his fistula at [facility].  He reports no difficulty with cannulation and reports no difficulty with access runs.  He has no pain associated with this.  He has diffuse neuropathy and reports numbness and tingling in both hands and arms and both feet.  Past Medical History:  Diagnosis Date  . Aortic valve disorders   . Benign neoplasm of colon   . Degeneration of intervertebral disc, site unspecified   . Diabetes mellitus without complication ([place])   . 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 Esophageal reflux   . ESRD (end stage renal disease) on dialysis ([place])   . Essential hypertension   . Obstructive sleep apnea (adult) (pediatric)   . Osteoarthrosis, unspecified whether generalized or localized, unspecified site   . Other and unspecified hyperlipidemia   . Pneumonia    interstitial pneumonia  . Pulmonary fibrosis ([place])   . Renal disorder   . Respiratory failure with hypoxia ([place]) 12/2015  . Shortness of breath dyspnea   . Transplanted, lung ([place])   . Unspecified essential hypertension     Family History  Problem Relation Age of Onset  . Pancreatic cancer Brother   . Heart disease Father     SOCIAL HISTORY: Social History   Socioeconomic History  . Marital status: Married    Spouse name: Not on file    . Number of children: Not on file  . Years of education: Not on file  . Highest education level: Not on file  Occupational History  . Occupation: Systems analyst  Social Needs  . Financial resource strain: Not on file  . Food insecurity:    Worry: Not on file    Inability: Not on file  . Transportation needs:    Medical: Not on file    Non-medical: Not on file  Tobacco Use  . Smoking status: Former Smoker    Packs/day: 0.30    Years: 20.00    Pack years: 6.00    Types: Cigarettes    Last attempt to quit: 06/06/2002    Years since quitting: 16.0  . Smokeless tobacco: Never Used  Substance and Sexual Activity  . Alcohol use: No    Alcohol/week: 0.0 standard drinks  . Drug use: No  . Sexual activity: Not on file  Lifestyle  . Physical activity:    Days per week: Not on file    Minutes per session: Not on file  . Stress: Not on file  Relationships  . Social connections:    Talks on phone: Not on file    Gets together: Not on file    Attends religious service: Not on file    Active member of club or organization: Not on file    Attends meetings of clubs or organizations: Not on file  Relationship status: Not on file  . Intimate partner violence:    Fear of current or ex partner: Not on file    Emotionally abused: Not on file    Physically abused: Not on file    Forced sexual activity: Not on file  Other Topics Concern  . Not on file  Social History Narrative  . Not on file    Allergies  Allergen Reactions  . Levofloxacin Other (See Comments)    LOSS OF CONSCIOUSNESS  . Nsaids Other (See Comments)    Patient instructed not to take NSAID's after his lung transplant    Current Outpatient Medications  Medication Sig Dispense Refill  . ACCU-CHEK FASTCLIX LANCETS MISC As directed up to 4 times daily  3  . acetaminophen (TYLENOL) 500 MG tablet Take 500-1,000 mg by mouth every 6 (six) hours as needed for headache.    Marland Kitchen aspirin 81 MG tablet Take 81 mg by mouth daily.     Marland Kitchen  azaTHIOprine (IMURAN) 50 MG tablet Take 1/2 tablet (25 mg) every Monday, Wednesday and Friday after HD.    . calcium acetate (PHOSLO) 667 MG capsule Take 1,334 mg by mouth 3 (three) times daily before meals.     . cycloSPORINE modified (NEORAL) 25 MG capsule Take 75 mg by mouth 2 (two) times daily.     . dorzolamide (TRUSOPT) 2 % ophthalmic solution Place 1 drop into both eyes 2 (two) times daily.  3  . fluticasone (FLONASE) 50 MCG/ACT nasal spray Place 2 sprays into the nose daily. (Patient taking differently: Place 2 sprays into the nose daily as needed for allergies. ) 30 g 2  . Immune Globulin 10% (PRIVIGEN) 20 GM/200ML SOLN Inject 0.5 g/kg into the vein every 3 (three) months.     . insulin regular (NOVOLIN R) 100 units/mL injection Inject 5 Units into the skin 3 (three) times daily before meals. Additional units (per sliding scale): BGL 201-250 = 1 unit; 251-300 = 2 units; 301-350 = 3 units; 351-400 = units; >401 = 5 units + CALL LUNG COORDINATOR @ [facility]    . Multiple Vitamin (MULTIVITAMIN WITH MINERALS) TABS tablet Take 1 tablet by mouth daily. On Tuesday, Thursday, Saturday and Sunday (non-HD days)    . Multiple Vitamin (MULTIVITAMIN) capsule Take by mouth.    Marland Kitchen omeprazole (PRILOSEC) 20 MG capsule Take 20 mg by mouth 2 (two) times daily before a meal.     . polyvinyl alcohol-povidone (REFRESH) 1.4-0.6 % ophthalmic solution Place 1-2 drops into both eyes daily as needed (for dryness).    . pravastatin (PRAVACHOL) 20 MG tablet Take 20 mg by mouth daily.     . prednisoLONE acetate (PRED FORTE) 1 % ophthalmic suspension Place 1 drop into both eyes 4 (four) times daily.  0  . predniSONE (DELTASONE) 5 MG tablet Take 5 mg by mouth daily with breakfast.     . sulfamethoxazole-trimethoprim (BACTRIM,SEPTRA) 400-80 MG tablet Take 1 tablet by mouth 3 (three) times a week. Monday, Wednesday, Friday    . valGANciclovir (VALCYTE) 450 MG tablet Take 1 tablet by mouth See admin instructions. Every Monday and  Friday AFTER DIALYSIS    . sertraline (ZOLOFT) 50 MG tablet Take 50 mg by mouth daily.     No current facility-administered medications for this visit.     REVIEW OF SYSTEMS:  [X]  denotes positive finding, [ ]  denotes negative finding Cardiac  Comments:  Chest pain or chest pressure:    Shortness of breath upon exertion:  Short of breath when lying flat:    Irregular heart rhythm:        Vascular    Pain in calf, thigh, or hip brought on by ambulation:    Pain in feet at night that wakes you up from your sleep:     Blood clot in your veins:    Leg swelling:  x       Pulmonary    Oxygen at home:    Productive cough:     Wheezing:         Neurologic    Sudden weakness in arms or legs:     Sudden numbness in arms or legs:     Sudden onset of difficulty speaking or slurred speech:    Temporary loss of vision in one eye:     Problems with dizziness:         Gastrointestinal    Blood in stool:     Vomited blood:         Genitourinary    Burning when urinating:     Blood in urine:        Psychiatric    Major depression:         Hematologic    Bleeding problems:    Problems with blood clotting too easily:        Skin    Rashes or ulcers:        Constitutional    Fever or chills:      PHYSICAL EXAM: Vitals:   06/12/18 0913  BP: 125/76  Pulse: 80  Resp: 20  Temp: 98 F (36.7 C)  SpO2: 97%  Weight: 150 lb (68 kg)  Height: 5' 3.5" (1.613 m)    GENERAL: The patient is a well-nourished male, in no acute distress. The vital signs are documented above. CARDIOVASCULAR: 2+ radial pulse bilaterally.  He does have a nicely matured upper arm fistula with mild aneurysmal change at several access locations.  He has loss of pigmentation and some several areas over the access sites but no thinning of his skin.  He has no adherence of the skin to the underlying fistula.  He has an excellent thrill PULMONARY: There is good air exchange  ABDOMEN: Soft and non-tender    MUSCULOSKELETAL: There are no major deformities or cyanosis. NEUROLOGIC: No focal weakness or paresthesias are detected. SKIN: There are no ulcers or rashes noted. PSYCHIATRIC: The patient has a normal affect.  DATA:  None  MEDICAL ISSUES: I discussed this with the patient at length.  I do not see any evidence of risk for bleeding.  I did discuss with the patient and his wife the warning signs of ulceration that is adherent to the fistula below the skin.  He will continue his use of the fistula and see [place] on an as-needed basis   Rosetta Posner, MD [facility] Vascular and Vein Specialists of [facility] Tel [phone] Pager [phone]

## 2018-06-13 DIAGNOSIS — E876 Hypokalemia: Secondary | ICD-10-CM | POA: Diagnosis not present

## 2018-06-13 DIAGNOSIS — N186 End stage renal disease: Secondary | ICD-10-CM | POA: Diagnosis not present

## 2018-06-13 DIAGNOSIS — T8579XA Infection and inflammatory reaction due to other internal prosthetic devices, implants and grafts, initial encounter: Secondary | ICD-10-CM | POA: Diagnosis not present

## 2018-06-13 DIAGNOSIS — N2581 Secondary hyperparathyroidism of renal origin: Secondary | ICD-10-CM | POA: Diagnosis not present

## 2018-06-14 DIAGNOSIS — R259 Unspecified abnormal involuntary movements: Secondary | ICD-10-CM | POA: Diagnosis not present

## 2018-06-14 DIAGNOSIS — M5416 Radiculopathy, lumbar region: Secondary | ICD-10-CM | POA: Diagnosis not present

## 2018-06-14 DIAGNOSIS — M545 Low back pain: Secondary | ICD-10-CM | POA: Diagnosis not present

## 2018-06-14 DIAGNOSIS — R260 Ataxic gait: Secondary | ICD-10-CM | POA: Diagnosis not present

## 2018-06-15 DIAGNOSIS — T8579XA Infection and inflammatory reaction due to other internal prosthetic devices, implants and grafts, initial encounter: Secondary | ICD-10-CM | POA: Diagnosis not present

## 2018-06-15 DIAGNOSIS — E876 Hypokalemia: Secondary | ICD-10-CM | POA: Diagnosis not present

## 2018-06-15 DIAGNOSIS — N2581 Secondary hyperparathyroidism of renal origin: Secondary | ICD-10-CM | POA: Diagnosis not present

## 2018-06-15 DIAGNOSIS — N186 End stage renal disease: Secondary | ICD-10-CM | POA: Diagnosis not present

## 2018-06-18 DIAGNOSIS — E876 Hypokalemia: Secondary | ICD-10-CM | POA: Diagnosis not present

## 2018-06-18 DIAGNOSIS — N2581 Secondary hyperparathyroidism of renal origin: Secondary | ICD-10-CM | POA: Diagnosis not present

## 2018-06-18 DIAGNOSIS — N186 End stage renal disease: Secondary | ICD-10-CM | POA: Diagnosis not present

## 2018-06-18 DIAGNOSIS — T8579XA Infection and inflammatory reaction due to other internal prosthetic devices, implants and grafts, initial encounter: Secondary | ICD-10-CM | POA: Diagnosis not present

## 2018-06-20 DIAGNOSIS — E876 Hypokalemia: Secondary | ICD-10-CM | POA: Diagnosis not present

## 2018-06-20 DIAGNOSIS — N2581 Secondary hyperparathyroidism of renal origin: Secondary | ICD-10-CM | POA: Diagnosis not present

## 2018-06-20 DIAGNOSIS — N186 End stage renal disease: Secondary | ICD-10-CM | POA: Diagnosis not present

## 2018-06-20 DIAGNOSIS — T8579XA Infection and inflammatory reaction due to other internal prosthetic devices, implants and grafts, initial encounter: Secondary | ICD-10-CM | POA: Diagnosis not present

## 2018-06-21 DIAGNOSIS — R260 Ataxic gait: Secondary | ICD-10-CM | POA: Diagnosis not present

## 2018-06-21 DIAGNOSIS — M545 Low back pain: Secondary | ICD-10-CM | POA: Diagnosis not present

## 2018-06-22 DIAGNOSIS — E876 Hypokalemia: Secondary | ICD-10-CM | POA: Diagnosis not present

## 2018-06-22 DIAGNOSIS — N186 End stage renal disease: Secondary | ICD-10-CM | POA: Diagnosis not present

## 2018-06-22 DIAGNOSIS — T8579XA Infection and inflammatory reaction due to other internal prosthetic devices, implants and grafts, initial encounter: Secondary | ICD-10-CM | POA: Diagnosis not present

## 2018-06-22 DIAGNOSIS — N2581 Secondary hyperparathyroidism of renal origin: Secondary | ICD-10-CM | POA: Diagnosis not present

## 2018-06-25 DIAGNOSIS — N2581 Secondary hyperparathyroidism of renal origin: Secondary | ICD-10-CM | POA: Diagnosis not present

## 2018-06-25 DIAGNOSIS — T8579XA Infection and inflammatory reaction due to other internal prosthetic devices, implants and grafts, initial encounter: Secondary | ICD-10-CM | POA: Diagnosis not present

## 2018-06-25 DIAGNOSIS — N186 End stage renal disease: Secondary | ICD-10-CM | POA: Diagnosis not present

## 2018-06-25 DIAGNOSIS — E876 Hypokalemia: Secondary | ICD-10-CM | POA: Diagnosis not present

## 2018-06-26 DIAGNOSIS — G4733 Obstructive sleep apnea (adult) (pediatric): Secondary | ICD-10-CM | POA: Diagnosis not present

## 2018-06-27 DIAGNOSIS — N186 End stage renal disease: Secondary | ICD-10-CM | POA: Diagnosis not present

## 2018-06-27 DIAGNOSIS — E876 Hypokalemia: Secondary | ICD-10-CM | POA: Diagnosis not present

## 2018-06-27 DIAGNOSIS — N2581 Secondary hyperparathyroidism of renal origin: Secondary | ICD-10-CM | POA: Diagnosis not present

## 2018-06-27 DIAGNOSIS — T8579XA Infection and inflammatory reaction due to other internal prosthetic devices, implants and grafts, initial encounter: Secondary | ICD-10-CM | POA: Diagnosis not present

## 2018-06-28 DIAGNOSIS — M545 Low back pain: Secondary | ICD-10-CM | POA: Diagnosis not present

## 2018-06-28 DIAGNOSIS — R260 Ataxic gait: Secondary | ICD-10-CM | POA: Diagnosis not present

## 2018-06-29 DIAGNOSIS — T8579XA Infection and inflammatory reaction due to other internal prosthetic devices, implants and grafts, initial encounter: Secondary | ICD-10-CM | POA: Diagnosis not present

## 2018-06-29 DIAGNOSIS — N2581 Secondary hyperparathyroidism of renal origin: Secondary | ICD-10-CM | POA: Diagnosis not present

## 2018-06-29 DIAGNOSIS — N186 End stage renal disease: Secondary | ICD-10-CM | POA: Diagnosis not present

## 2018-06-29 DIAGNOSIS — E876 Hypokalemia: Secondary | ICD-10-CM | POA: Diagnosis not present

## 2018-07-02 DIAGNOSIS — E876 Hypokalemia: Secondary | ICD-10-CM | POA: Diagnosis not present

## 2018-07-02 DIAGNOSIS — N186 End stage renal disease: Secondary | ICD-10-CM | POA: Diagnosis not present

## 2018-07-02 DIAGNOSIS — T8579XA Infection and inflammatory reaction due to other internal prosthetic devices, implants and grafts, initial encounter: Secondary | ICD-10-CM | POA: Diagnosis not present

## 2018-07-02 DIAGNOSIS — N2581 Secondary hyperparathyroidism of renal origin: Secondary | ICD-10-CM | POA: Diagnosis not present

## 2018-07-04 DIAGNOSIS — N2581 Secondary hyperparathyroidism of renal origin: Secondary | ICD-10-CM | POA: Diagnosis not present

## 2018-07-04 DIAGNOSIS — N186 End stage renal disease: Secondary | ICD-10-CM | POA: Diagnosis not present

## 2018-07-04 DIAGNOSIS — T8579XA Infection and inflammatory reaction due to other internal prosthetic devices, implants and grafts, initial encounter: Secondary | ICD-10-CM | POA: Diagnosis not present

## 2018-07-04 DIAGNOSIS — E876 Hypokalemia: Secondary | ICD-10-CM | POA: Diagnosis not present

## 2018-07-05 DIAGNOSIS — R260 Ataxic gait: Secondary | ICD-10-CM | POA: Diagnosis not present

## 2018-07-05 DIAGNOSIS — M545 Low back pain: Secondary | ICD-10-CM | POA: Diagnosis not present

## 2018-07-06 DIAGNOSIS — N2581 Secondary hyperparathyroidism of renal origin: Secondary | ICD-10-CM | POA: Diagnosis not present

## 2018-07-06 DIAGNOSIS — T8579XA Infection and inflammatory reaction due to other internal prosthetic devices, implants and grafts, initial encounter: Secondary | ICD-10-CM | POA: Diagnosis not present

## 2018-07-06 DIAGNOSIS — E876 Hypokalemia: Secondary | ICD-10-CM | POA: Diagnosis not present

## 2018-07-06 DIAGNOSIS — N186 End stage renal disease: Secondary | ICD-10-CM | POA: Diagnosis not present

## 2018-07-07 DIAGNOSIS — Z992 Dependence on renal dialysis: Secondary | ICD-10-CM | POA: Diagnosis not present

## 2018-07-07 DIAGNOSIS — N186 End stage renal disease: Secondary | ICD-10-CM | POA: Diagnosis not present

## 2018-07-07 DIAGNOSIS — T86819 Unspecified complication of lung transplant: Secondary | ICD-10-CM | POA: Diagnosis not present

## 2018-07-09 DIAGNOSIS — N186 End stage renal disease: Secondary | ICD-10-CM | POA: Diagnosis not present

## 2018-07-09 DIAGNOSIS — T8579XA Infection and inflammatory reaction due to other internal prosthetic devices, implants and grafts, initial encounter: Secondary | ICD-10-CM | POA: Diagnosis not present

## 2018-07-09 DIAGNOSIS — N2581 Secondary hyperparathyroidism of renal origin: Secondary | ICD-10-CM | POA: Diagnosis not present

## 2018-07-09 DIAGNOSIS — E876 Hypokalemia: Secondary | ICD-10-CM | POA: Diagnosis not present

## 2018-07-11 DIAGNOSIS — N186 End stage renal disease: Secondary | ICD-10-CM | POA: Diagnosis not present

## 2018-07-11 DIAGNOSIS — T8579XA Infection and inflammatory reaction due to other internal prosthetic devices, implants and grafts, initial encounter: Secondary | ICD-10-CM | POA: Diagnosis not present

## 2018-07-11 DIAGNOSIS — N2581 Secondary hyperparathyroidism of renal origin: Secondary | ICD-10-CM | POA: Diagnosis not present

## 2018-07-11 DIAGNOSIS — E876 Hypokalemia: Secondary | ICD-10-CM | POA: Diagnosis not present

## 2018-07-12 DIAGNOSIS — Z79899 Other long term (current) drug therapy: Secondary | ICD-10-CM | POA: Diagnosis not present

## 2018-07-12 DIAGNOSIS — Z298 Encounter for other specified prophylactic measures: Secondary | ICD-10-CM | POA: Diagnosis not present

## 2018-07-12 DIAGNOSIS — R260 Ataxic gait: Secondary | ICD-10-CM | POA: Diagnosis not present

## 2018-07-12 DIAGNOSIS — Z942 Lung transplant status: Secondary | ICD-10-CM | POA: Diagnosis not present

## 2018-07-12 DIAGNOSIS — N19 Unspecified kidney failure: Secondary | ICD-10-CM | POA: Diagnosis not present

## 2018-07-12 DIAGNOSIS — T86819 Unspecified complication of lung transplant: Secondary | ICD-10-CM | POA: Diagnosis not present

## 2018-07-12 DIAGNOSIS — Z5181 Encounter for therapeutic drug level monitoring: Secondary | ICD-10-CM | POA: Diagnosis not present

## 2018-07-12 DIAGNOSIS — M545 Low back pain: Secondary | ICD-10-CM | POA: Diagnosis not present

## 2018-07-13 DIAGNOSIS — E876 Hypokalemia: Secondary | ICD-10-CM | POA: Diagnosis not present

## 2018-07-13 DIAGNOSIS — N186 End stage renal disease: Secondary | ICD-10-CM | POA: Diagnosis not present

## 2018-07-13 DIAGNOSIS — N2581 Secondary hyperparathyroidism of renal origin: Secondary | ICD-10-CM | POA: Diagnosis not present

## 2018-07-13 DIAGNOSIS — T8579XA Infection and inflammatory reaction due to other internal prosthetic devices, implants and grafts, initial encounter: Secondary | ICD-10-CM | POA: Diagnosis not present

## 2018-07-16 DIAGNOSIS — T8579XA Infection and inflammatory reaction due to other internal prosthetic devices, implants and grafts, initial encounter: Secondary | ICD-10-CM | POA: Diagnosis not present

## 2018-07-16 DIAGNOSIS — N2581 Secondary hyperparathyroidism of renal origin: Secondary | ICD-10-CM | POA: Diagnosis not present

## 2018-07-16 DIAGNOSIS — N186 End stage renal disease: Secondary | ICD-10-CM | POA: Diagnosis not present

## 2018-07-16 DIAGNOSIS — E876 Hypokalemia: Secondary | ICD-10-CM | POA: Diagnosis not present

## 2018-07-17 DIAGNOSIS — I739 Peripheral vascular disease, unspecified: Secondary | ICD-10-CM | POA: Diagnosis not present

## 2018-07-17 DIAGNOSIS — Z0000 Encounter for general adult medical examination without abnormal findings: Secondary | ICD-10-CM | POA: Diagnosis not present

## 2018-07-17 DIAGNOSIS — Z942 Lung transplant status: Secondary | ICD-10-CM | POA: Diagnosis not present

## 2018-07-17 DIAGNOSIS — F329 Major depressive disorder, single episode, unspecified: Secondary | ICD-10-CM | POA: Diagnosis not present

## 2018-07-17 DIAGNOSIS — Z1389 Encounter for screening for other disorder: Secondary | ICD-10-CM | POA: Diagnosis not present

## 2018-07-17 DIAGNOSIS — N186 End stage renal disease: Secondary | ICD-10-CM | POA: Diagnosis not present

## 2018-07-17 DIAGNOSIS — M545 Low back pain: Secondary | ICD-10-CM | POA: Diagnosis not present

## 2018-07-17 DIAGNOSIS — I700 Atherosclerosis of aorta: Secondary | ICD-10-CM | POA: Diagnosis not present

## 2018-07-17 DIAGNOSIS — I10 Essential (primary) hypertension: Secondary | ICD-10-CM | POA: Diagnosis not present

## 2018-07-17 DIAGNOSIS — E1122 Type 2 diabetes mellitus with diabetic chronic kidney disease: Secondary | ICD-10-CM | POA: Diagnosis not present

## 2018-07-17 DIAGNOSIS — Z992 Dependence on renal dialysis: Secondary | ICD-10-CM | POA: Diagnosis not present

## 2018-07-17 DIAGNOSIS — J849 Interstitial pulmonary disease, unspecified: Secondary | ICD-10-CM | POA: Diagnosis not present

## 2018-07-17 DIAGNOSIS — Z125 Encounter for screening for malignant neoplasm of prostate: Secondary | ICD-10-CM | POA: Diagnosis not present

## 2018-07-17 DIAGNOSIS — R260 Ataxic gait: Secondary | ICD-10-CM | POA: Diagnosis not present

## 2018-07-17 DIAGNOSIS — I270 Primary pulmonary hypertension: Secondary | ICD-10-CM | POA: Diagnosis not present

## 2018-07-17 DIAGNOSIS — I5032 Chronic diastolic (congestive) heart failure: Secondary | ICD-10-CM | POA: Diagnosis not present

## 2018-07-17 DIAGNOSIS — E782 Mixed hyperlipidemia: Secondary | ICD-10-CM | POA: Diagnosis not present

## 2018-07-18 DIAGNOSIS — T8579XA Infection and inflammatory reaction due to other internal prosthetic devices, implants and grafts, initial encounter: Secondary | ICD-10-CM | POA: Diagnosis not present

## 2018-07-18 DIAGNOSIS — N186 End stage renal disease: Secondary | ICD-10-CM | POA: Diagnosis not present

## 2018-07-18 DIAGNOSIS — N2581 Secondary hyperparathyroidism of renal origin: Secondary | ICD-10-CM | POA: Diagnosis not present

## 2018-07-18 DIAGNOSIS — E876 Hypokalemia: Secondary | ICD-10-CM | POA: Diagnosis not present

## 2018-07-20 DIAGNOSIS — E876 Hypokalemia: Secondary | ICD-10-CM | POA: Diagnosis not present

## 2018-07-20 DIAGNOSIS — T8579XA Infection and inflammatory reaction due to other internal prosthetic devices, implants and grafts, initial encounter: Secondary | ICD-10-CM | POA: Diagnosis not present

## 2018-07-20 DIAGNOSIS — N186 End stage renal disease: Secondary | ICD-10-CM | POA: Diagnosis not present

## 2018-07-20 DIAGNOSIS — N2581 Secondary hyperparathyroidism of renal origin: Secondary | ICD-10-CM | POA: Diagnosis not present

## 2018-07-23 DIAGNOSIS — N186 End stage renal disease: Secondary | ICD-10-CM | POA: Diagnosis not present

## 2018-07-23 DIAGNOSIS — T8579XA Infection and inflammatory reaction due to other internal prosthetic devices, implants and grafts, initial encounter: Secondary | ICD-10-CM | POA: Diagnosis not present

## 2018-07-23 DIAGNOSIS — N2581 Secondary hyperparathyroidism of renal origin: Secondary | ICD-10-CM | POA: Diagnosis not present

## 2018-07-23 DIAGNOSIS — E876 Hypokalemia: Secondary | ICD-10-CM | POA: Diagnosis not present

## 2018-07-24 DIAGNOSIS — R260 Ataxic gait: Secondary | ICD-10-CM | POA: Diagnosis not present

## 2018-07-24 DIAGNOSIS — M545 Low back pain: Secondary | ICD-10-CM | POA: Diagnosis not present

## 2018-07-25 DIAGNOSIS — E876 Hypokalemia: Secondary | ICD-10-CM | POA: Diagnosis not present

## 2018-07-25 DIAGNOSIS — N2581 Secondary hyperparathyroidism of renal origin: Secondary | ICD-10-CM | POA: Diagnosis not present

## 2018-07-25 DIAGNOSIS — N186 End stage renal disease: Secondary | ICD-10-CM | POA: Diagnosis not present

## 2018-07-25 DIAGNOSIS — T8579XA Infection and inflammatory reaction due to other internal prosthetic devices, implants and grafts, initial encounter: Secondary | ICD-10-CM | POA: Diagnosis not present

## 2018-07-27 DIAGNOSIS — N2581 Secondary hyperparathyroidism of renal origin: Secondary | ICD-10-CM | POA: Diagnosis not present

## 2018-07-27 DIAGNOSIS — N186 End stage renal disease: Secondary | ICD-10-CM | POA: Diagnosis not present

## 2018-07-27 DIAGNOSIS — T8579XA Infection and inflammatory reaction due to other internal prosthetic devices, implants and grafts, initial encounter: Secondary | ICD-10-CM | POA: Diagnosis not present

## 2018-07-27 DIAGNOSIS — E876 Hypokalemia: Secondary | ICD-10-CM | POA: Diagnosis not present

## 2018-07-30 DIAGNOSIS — T8579XA Infection and inflammatory reaction due to other internal prosthetic devices, implants and grafts, initial encounter: Secondary | ICD-10-CM | POA: Diagnosis not present

## 2018-07-30 DIAGNOSIS — N2581 Secondary hyperparathyroidism of renal origin: Secondary | ICD-10-CM | POA: Diagnosis not present

## 2018-07-30 DIAGNOSIS — N186 End stage renal disease: Secondary | ICD-10-CM | POA: Diagnosis not present

## 2018-07-30 DIAGNOSIS — E876 Hypokalemia: Secondary | ICD-10-CM | POA: Diagnosis not present

## 2018-07-31 DIAGNOSIS — H11822 Conjunctivochalasis, left eye: Secondary | ICD-10-CM | POA: Diagnosis not present

## 2018-07-31 DIAGNOSIS — H04123 Dry eye syndrome of bilateral lacrimal glands: Secondary | ICD-10-CM | POA: Diagnosis not present

## 2018-07-31 DIAGNOSIS — M545 Low back pain: Secondary | ICD-10-CM | POA: Diagnosis not present

## 2018-07-31 DIAGNOSIS — M79669 Pain in unspecified lower leg: Secondary | ICD-10-CM | POA: Diagnosis not present

## 2018-07-31 DIAGNOSIS — R260 Ataxic gait: Secondary | ICD-10-CM | POA: Diagnosis not present

## 2018-07-31 DIAGNOSIS — E119 Type 2 diabetes mellitus without complications: Secondary | ICD-10-CM | POA: Diagnosis not present

## 2018-07-31 DIAGNOSIS — G63 Polyneuropathy in diseases classified elsewhere: Secondary | ICD-10-CM | POA: Diagnosis not present

## 2018-08-01 DIAGNOSIS — N186 End stage renal disease: Secondary | ICD-10-CM | POA: Diagnosis not present

## 2018-08-01 DIAGNOSIS — N2581 Secondary hyperparathyroidism of renal origin: Secondary | ICD-10-CM | POA: Diagnosis not present

## 2018-08-01 DIAGNOSIS — T8579XA Infection and inflammatory reaction due to other internal prosthetic devices, implants and grafts, initial encounter: Secondary | ICD-10-CM | POA: Diagnosis not present

## 2018-08-01 DIAGNOSIS — E876 Hypokalemia: Secondary | ICD-10-CM | POA: Diagnosis not present

## 2018-08-02 DIAGNOSIS — N19 Unspecified kidney failure: Secondary | ICD-10-CM | POA: Diagnosis not present

## 2018-08-02 DIAGNOSIS — Z298 Encounter for other specified prophylactic measures: Secondary | ICD-10-CM | POA: Diagnosis not present

## 2018-08-02 DIAGNOSIS — T86819 Unspecified complication of lung transplant: Secondary | ICD-10-CM | POA: Diagnosis not present

## 2018-08-02 DIAGNOSIS — Z5181 Encounter for therapeutic drug level monitoring: Secondary | ICD-10-CM | POA: Diagnosis not present

## 2018-08-02 DIAGNOSIS — Z942 Lung transplant status: Secondary | ICD-10-CM | POA: Diagnosis not present

## 2018-08-02 DIAGNOSIS — Z79899 Other long term (current) drug therapy: Secondary | ICD-10-CM | POA: Diagnosis not present

## 2018-08-02 DIAGNOSIS — G63 Polyneuropathy in diseases classified elsewhere: Secondary | ICD-10-CM | POA: Diagnosis not present

## 2018-08-03 DIAGNOSIS — E876 Hypokalemia: Secondary | ICD-10-CM | POA: Diagnosis not present

## 2018-08-03 DIAGNOSIS — N2581 Secondary hyperparathyroidism of renal origin: Secondary | ICD-10-CM | POA: Diagnosis not present

## 2018-08-03 DIAGNOSIS — T8579XA Infection and inflammatory reaction due to other internal prosthetic devices, implants and grafts, initial encounter: Secondary | ICD-10-CM | POA: Diagnosis not present

## 2018-08-03 DIAGNOSIS — N186 End stage renal disease: Secondary | ICD-10-CM | POA: Diagnosis not present

## 2018-08-05 DIAGNOSIS — N186 End stage renal disease: Secondary | ICD-10-CM | POA: Diagnosis not present

## 2018-08-05 DIAGNOSIS — Z992 Dependence on renal dialysis: Secondary | ICD-10-CM | POA: Diagnosis not present

## 2018-08-05 DIAGNOSIS — T86819 Unspecified complication of lung transplant: Secondary | ICD-10-CM | POA: Diagnosis not present

## 2018-08-06 DIAGNOSIS — N186 End stage renal disease: Secondary | ICD-10-CM | POA: Diagnosis not present

## 2018-08-06 DIAGNOSIS — E876 Hypokalemia: Secondary | ICD-10-CM | POA: Diagnosis not present

## 2018-08-06 DIAGNOSIS — N2581 Secondary hyperparathyroidism of renal origin: Secondary | ICD-10-CM | POA: Diagnosis not present

## 2018-08-06 DIAGNOSIS — T8579XA Infection and inflammatory reaction due to other internal prosthetic devices, implants and grafts, initial encounter: Secondary | ICD-10-CM | POA: Diagnosis not present

## 2018-08-08 DIAGNOSIS — N2581 Secondary hyperparathyroidism of renal origin: Secondary | ICD-10-CM | POA: Diagnosis not present

## 2018-08-08 DIAGNOSIS — T8579XA Infection and inflammatory reaction due to other internal prosthetic devices, implants and grafts, initial encounter: Secondary | ICD-10-CM | POA: Diagnosis not present

## 2018-08-08 DIAGNOSIS — N186 End stage renal disease: Secondary | ICD-10-CM | POA: Diagnosis not present

## 2018-08-08 DIAGNOSIS — E876 Hypokalemia: Secondary | ICD-10-CM | POA: Diagnosis not present

## 2018-08-09 DIAGNOSIS — M545 Low back pain: Secondary | ICD-10-CM | POA: Diagnosis not present

## 2018-08-09 DIAGNOSIS — R260 Ataxic gait: Secondary | ICD-10-CM | POA: Diagnosis not present

## 2018-08-10 DIAGNOSIS — E876 Hypokalemia: Secondary | ICD-10-CM | POA: Diagnosis not present

## 2018-08-10 DIAGNOSIS — N2581 Secondary hyperparathyroidism of renal origin: Secondary | ICD-10-CM | POA: Diagnosis not present

## 2018-08-10 DIAGNOSIS — N186 End stage renal disease: Secondary | ICD-10-CM | POA: Diagnosis not present

## 2018-08-10 DIAGNOSIS — T8579XA Infection and inflammatory reaction due to other internal prosthetic devices, implants and grafts, initial encounter: Secondary | ICD-10-CM | POA: Diagnosis not present

## 2018-08-13 DIAGNOSIS — T8579XA Infection and inflammatory reaction due to other internal prosthetic devices, implants and grafts, initial encounter: Secondary | ICD-10-CM | POA: Diagnosis not present

## 2018-08-13 DIAGNOSIS — N2581 Secondary hyperparathyroidism of renal origin: Secondary | ICD-10-CM | POA: Diagnosis not present

## 2018-08-13 DIAGNOSIS — E876 Hypokalemia: Secondary | ICD-10-CM | POA: Diagnosis not present

## 2018-08-13 DIAGNOSIS — N186 End stage renal disease: Secondary | ICD-10-CM | POA: Diagnosis not present

## 2018-08-14 DIAGNOSIS — D899 Disorder involving the immune mechanism, unspecified: Secondary | ICD-10-CM | POA: Diagnosis not present

## 2018-08-14 DIAGNOSIS — R760 Raised antibody titer: Secondary | ICD-10-CM | POA: Diagnosis not present

## 2018-08-14 DIAGNOSIS — T86810 Lung transplant rejection: Secondary | ICD-10-CM | POA: Diagnosis not present

## 2018-08-14 DIAGNOSIS — R942 Abnormal results of pulmonary function studies: Secondary | ICD-10-CM | POA: Diagnosis not present

## 2018-08-14 DIAGNOSIS — Z5181 Encounter for therapeutic drug level monitoring: Secondary | ICD-10-CM | POA: Diagnosis not present

## 2018-08-14 DIAGNOSIS — J8410 Pulmonary fibrosis, unspecified: Secondary | ICD-10-CM | POA: Diagnosis not present

## 2018-08-14 DIAGNOSIS — Z942 Lung transplant status: Secondary | ICD-10-CM | POA: Diagnosis not present

## 2018-08-14 DIAGNOSIS — N186 End stage renal disease: Secondary | ICD-10-CM | POA: Diagnosis not present

## 2018-08-15 DIAGNOSIS — N186 End stage renal disease: Secondary | ICD-10-CM | POA: Diagnosis not present

## 2018-08-15 DIAGNOSIS — E876 Hypokalemia: Secondary | ICD-10-CM | POA: Diagnosis not present

## 2018-08-15 DIAGNOSIS — N2581 Secondary hyperparathyroidism of renal origin: Secondary | ICD-10-CM | POA: Diagnosis not present

## 2018-08-15 DIAGNOSIS — T8579XA Infection and inflammatory reaction due to other internal prosthetic devices, implants and grafts, initial encounter: Secondary | ICD-10-CM | POA: Diagnosis not present

## 2018-08-16 DIAGNOSIS — R260 Ataxic gait: Secondary | ICD-10-CM | POA: Diagnosis not present

## 2018-08-16 DIAGNOSIS — M545 Low back pain: Secondary | ICD-10-CM | POA: Diagnosis not present

## 2018-08-17 DIAGNOSIS — E876 Hypokalemia: Secondary | ICD-10-CM | POA: Diagnosis not present

## 2018-08-17 DIAGNOSIS — T8579XA Infection and inflammatory reaction due to other internal prosthetic devices, implants and grafts, initial encounter: Secondary | ICD-10-CM | POA: Diagnosis not present

## 2018-08-17 DIAGNOSIS — N2581 Secondary hyperparathyroidism of renal origin: Secondary | ICD-10-CM | POA: Diagnosis not present

## 2018-08-17 DIAGNOSIS — N186 End stage renal disease: Secondary | ICD-10-CM | POA: Diagnosis not present

## 2018-08-20 DIAGNOSIS — N2581 Secondary hyperparathyroidism of renal origin: Secondary | ICD-10-CM | POA: Diagnosis not present

## 2018-08-20 DIAGNOSIS — N186 End stage renal disease: Secondary | ICD-10-CM | POA: Diagnosis not present

## 2018-08-20 DIAGNOSIS — E876 Hypokalemia: Secondary | ICD-10-CM | POA: Diagnosis not present

## 2018-08-20 DIAGNOSIS — T8579XA Infection and inflammatory reaction due to other internal prosthetic devices, implants and grafts, initial encounter: Secondary | ICD-10-CM | POA: Diagnosis not present

## 2018-08-22 DIAGNOSIS — E876 Hypokalemia: Secondary | ICD-10-CM | POA: Diagnosis not present

## 2018-08-22 DIAGNOSIS — T8579XA Infection and inflammatory reaction due to other internal prosthetic devices, implants and grafts, initial encounter: Secondary | ICD-10-CM | POA: Diagnosis not present

## 2018-08-22 DIAGNOSIS — N186 End stage renal disease: Secondary | ICD-10-CM | POA: Diagnosis not present

## 2018-08-22 DIAGNOSIS — N2581 Secondary hyperparathyroidism of renal origin: Secondary | ICD-10-CM | POA: Diagnosis not present

## 2018-08-24 DIAGNOSIS — T8579XA Infection and inflammatory reaction due to other internal prosthetic devices, implants and grafts, initial encounter: Secondary | ICD-10-CM | POA: Diagnosis not present

## 2018-08-24 DIAGNOSIS — N2581 Secondary hyperparathyroidism of renal origin: Secondary | ICD-10-CM | POA: Diagnosis not present

## 2018-08-24 DIAGNOSIS — N186 End stage renal disease: Secondary | ICD-10-CM | POA: Diagnosis not present

## 2018-08-24 DIAGNOSIS — E876 Hypokalemia: Secondary | ICD-10-CM | POA: Diagnosis not present

## 2018-08-27 DIAGNOSIS — N186 End stage renal disease: Secondary | ICD-10-CM | POA: Diagnosis not present

## 2018-08-27 DIAGNOSIS — T8579XA Infection and inflammatory reaction due to other internal prosthetic devices, implants and grafts, initial encounter: Secondary | ICD-10-CM | POA: Diagnosis not present

## 2018-08-27 DIAGNOSIS — N2581 Secondary hyperparathyroidism of renal origin: Secondary | ICD-10-CM | POA: Diagnosis not present

## 2018-08-27 DIAGNOSIS — E876 Hypokalemia: Secondary | ICD-10-CM | POA: Diagnosis not present

## 2018-08-28 DIAGNOSIS — E119 Type 2 diabetes mellitus without complications: Secondary | ICD-10-CM | POA: Diagnosis not present

## 2018-08-29 DIAGNOSIS — N2581 Secondary hyperparathyroidism of renal origin: Secondary | ICD-10-CM | POA: Diagnosis not present

## 2018-08-29 DIAGNOSIS — E876 Hypokalemia: Secondary | ICD-10-CM | POA: Diagnosis not present

## 2018-08-29 DIAGNOSIS — N186 End stage renal disease: Secondary | ICD-10-CM | POA: Diagnosis not present

## 2018-08-29 DIAGNOSIS — T8579XA Infection and inflammatory reaction due to other internal prosthetic devices, implants and grafts, initial encounter: Secondary | ICD-10-CM | POA: Diagnosis not present

## 2018-08-31 DIAGNOSIS — N186 End stage renal disease: Secondary | ICD-10-CM | POA: Diagnosis not present

## 2018-08-31 DIAGNOSIS — N2581 Secondary hyperparathyroidism of renal origin: Secondary | ICD-10-CM | POA: Diagnosis not present

## 2018-08-31 DIAGNOSIS — T8579XA Infection and inflammatory reaction due to other internal prosthetic devices, implants and grafts, initial encounter: Secondary | ICD-10-CM | POA: Diagnosis not present

## 2018-08-31 DIAGNOSIS — E876 Hypokalemia: Secondary | ICD-10-CM | POA: Diagnosis not present

## 2018-09-03 DIAGNOSIS — T8579XA Infection and inflammatory reaction due to other internal prosthetic devices, implants and grafts, initial encounter: Secondary | ICD-10-CM | POA: Diagnosis not present

## 2018-09-03 DIAGNOSIS — N186 End stage renal disease: Secondary | ICD-10-CM | POA: Diagnosis not present

## 2018-09-03 DIAGNOSIS — E876 Hypokalemia: Secondary | ICD-10-CM | POA: Diagnosis not present

## 2018-09-03 DIAGNOSIS — N2581 Secondary hyperparathyroidism of renal origin: Secondary | ICD-10-CM | POA: Diagnosis not present

## 2018-09-05 DIAGNOSIS — T8579XA Infection and inflammatory reaction due to other internal prosthetic devices, implants and grafts, initial encounter: Secondary | ICD-10-CM | POA: Diagnosis not present

## 2018-09-05 DIAGNOSIS — E876 Hypokalemia: Secondary | ICD-10-CM | POA: Diagnosis not present

## 2018-09-05 DIAGNOSIS — N186 End stage renal disease: Secondary | ICD-10-CM | POA: Diagnosis not present

## 2018-09-05 DIAGNOSIS — Z992 Dependence on renal dialysis: Secondary | ICD-10-CM | POA: Diagnosis not present

## 2018-09-05 DIAGNOSIS — N2581 Secondary hyperparathyroidism of renal origin: Secondary | ICD-10-CM | POA: Diagnosis not present

## 2018-09-05 DIAGNOSIS — T86819 Unspecified complication of lung transplant: Secondary | ICD-10-CM | POA: Diagnosis not present

## 2018-09-07 DIAGNOSIS — N186 End stage renal disease: Secondary | ICD-10-CM | POA: Diagnosis not present

## 2018-09-07 DIAGNOSIS — T8579XA Infection and inflammatory reaction due to other internal prosthetic devices, implants and grafts, initial encounter: Secondary | ICD-10-CM | POA: Diagnosis not present

## 2018-09-07 DIAGNOSIS — E876 Hypokalemia: Secondary | ICD-10-CM | POA: Diagnosis not present

## 2018-09-07 DIAGNOSIS — N2581 Secondary hyperparathyroidism of renal origin: Secondary | ICD-10-CM | POA: Diagnosis not present

## 2018-09-10 DIAGNOSIS — T8579XA Infection and inflammatory reaction due to other internal prosthetic devices, implants and grafts, initial encounter: Secondary | ICD-10-CM | POA: Diagnosis not present

## 2018-09-10 DIAGNOSIS — E876 Hypokalemia: Secondary | ICD-10-CM | POA: Diagnosis not present

## 2018-09-10 DIAGNOSIS — N2581 Secondary hyperparathyroidism of renal origin: Secondary | ICD-10-CM | POA: Diagnosis not present

## 2018-09-10 DIAGNOSIS — N186 End stage renal disease: Secondary | ICD-10-CM | POA: Diagnosis not present

## 2018-09-12 DIAGNOSIS — N2581 Secondary hyperparathyroidism of renal origin: Secondary | ICD-10-CM | POA: Diagnosis not present

## 2018-09-12 DIAGNOSIS — N186 End stage renal disease: Secondary | ICD-10-CM | POA: Diagnosis not present

## 2018-09-12 DIAGNOSIS — T8579XA Infection and inflammatory reaction due to other internal prosthetic devices, implants and grafts, initial encounter: Secondary | ICD-10-CM | POA: Diagnosis not present

## 2018-09-12 DIAGNOSIS — E876 Hypokalemia: Secondary | ICD-10-CM | POA: Diagnosis not present

## 2018-09-14 DIAGNOSIS — N186 End stage renal disease: Secondary | ICD-10-CM | POA: Diagnosis not present

## 2018-09-14 DIAGNOSIS — T8579XA Infection and inflammatory reaction due to other internal prosthetic devices, implants and grafts, initial encounter: Secondary | ICD-10-CM | POA: Diagnosis not present

## 2018-09-14 DIAGNOSIS — N2581 Secondary hyperparathyroidism of renal origin: Secondary | ICD-10-CM | POA: Diagnosis not present

## 2018-09-14 DIAGNOSIS — E876 Hypokalemia: Secondary | ICD-10-CM | POA: Diagnosis not present

## 2018-09-17 DIAGNOSIS — N186 End stage renal disease: Secondary | ICD-10-CM | POA: Diagnosis not present

## 2018-09-17 DIAGNOSIS — E876 Hypokalemia: Secondary | ICD-10-CM | POA: Diagnosis not present

## 2018-09-17 DIAGNOSIS — T8579XA Infection and inflammatory reaction due to other internal prosthetic devices, implants and grafts, initial encounter: Secondary | ICD-10-CM | POA: Diagnosis not present

## 2018-09-17 DIAGNOSIS — N2581 Secondary hyperparathyroidism of renal origin: Secondary | ICD-10-CM | POA: Diagnosis not present

## 2018-09-19 DIAGNOSIS — T8579XA Infection and inflammatory reaction due to other internal prosthetic devices, implants and grafts, initial encounter: Secondary | ICD-10-CM | POA: Diagnosis not present

## 2018-09-19 DIAGNOSIS — E876 Hypokalemia: Secondary | ICD-10-CM | POA: Diagnosis not present

## 2018-09-19 DIAGNOSIS — N186 End stage renal disease: Secondary | ICD-10-CM | POA: Diagnosis not present

## 2018-09-19 DIAGNOSIS — N2581 Secondary hyperparathyroidism of renal origin: Secondary | ICD-10-CM | POA: Diagnosis not present

## 2018-09-21 DIAGNOSIS — N186 End stage renal disease: Secondary | ICD-10-CM | POA: Diagnosis not present

## 2018-09-21 DIAGNOSIS — T8579XA Infection and inflammatory reaction due to other internal prosthetic devices, implants and grafts, initial encounter: Secondary | ICD-10-CM | POA: Diagnosis not present

## 2018-09-21 DIAGNOSIS — E876 Hypokalemia: Secondary | ICD-10-CM | POA: Diagnosis not present

## 2018-09-21 DIAGNOSIS — N2581 Secondary hyperparathyroidism of renal origin: Secondary | ICD-10-CM | POA: Diagnosis not present

## 2018-09-24 DIAGNOSIS — E876 Hypokalemia: Secondary | ICD-10-CM | POA: Diagnosis not present

## 2018-09-24 DIAGNOSIS — T8579XA Infection and inflammatory reaction due to other internal prosthetic devices, implants and grafts, initial encounter: Secondary | ICD-10-CM | POA: Diagnosis not present

## 2018-09-24 DIAGNOSIS — N2581 Secondary hyperparathyroidism of renal origin: Secondary | ICD-10-CM | POA: Diagnosis not present

## 2018-09-24 DIAGNOSIS — N186 End stage renal disease: Secondary | ICD-10-CM | POA: Diagnosis not present

## 2018-09-26 DIAGNOSIS — T8579XA Infection and inflammatory reaction due to other internal prosthetic devices, implants and grafts, initial encounter: Secondary | ICD-10-CM | POA: Diagnosis not present

## 2018-09-26 DIAGNOSIS — E876 Hypokalemia: Secondary | ICD-10-CM | POA: Diagnosis not present

## 2018-09-26 DIAGNOSIS — N2581 Secondary hyperparathyroidism of renal origin: Secondary | ICD-10-CM | POA: Diagnosis not present

## 2018-09-26 DIAGNOSIS — N186 End stage renal disease: Secondary | ICD-10-CM | POA: Diagnosis not present

## 2018-09-27 DIAGNOSIS — E1122 Type 2 diabetes mellitus with diabetic chronic kidney disease: Secondary | ICD-10-CM | POA: Diagnosis present

## 2018-09-27 DIAGNOSIS — D899 Disorder involving the immune mechanism, unspecified: Secondary | ICD-10-CM | POA: Diagnosis not present

## 2018-09-27 DIAGNOSIS — Z992 Dependence on renal dialysis: Secondary | ICD-10-CM | POA: Diagnosis not present

## 2018-09-27 DIAGNOSIS — K219 Gastro-esophageal reflux disease without esophagitis: Secondary | ICD-10-CM | POA: Diagnosis present

## 2018-09-27 DIAGNOSIS — R0600 Dyspnea, unspecified: Secondary | ICD-10-CM | POA: Diagnosis not present

## 2018-09-27 DIAGNOSIS — E1142 Type 2 diabetes mellitus with diabetic polyneuropathy: Secondary | ICD-10-CM | POA: Diagnosis present

## 2018-09-27 DIAGNOSIS — J849 Interstitial pulmonary disease, unspecified: Secondary | ICD-10-CM | POA: Diagnosis present

## 2018-09-27 DIAGNOSIS — J8410 Pulmonary fibrosis, unspecified: Secondary | ICD-10-CM | POA: Diagnosis not present

## 2018-09-27 DIAGNOSIS — Z794 Long term (current) use of insulin: Secondary | ICD-10-CM | POA: Diagnosis not present

## 2018-09-27 DIAGNOSIS — Z7982 Long term (current) use of aspirin: Secondary | ICD-10-CM | POA: Diagnosis not present

## 2018-09-27 DIAGNOSIS — G259 Extrapyramidal and movement disorder, unspecified: Secondary | ICD-10-CM | POA: Diagnosis not present

## 2018-09-27 DIAGNOSIS — D631 Anemia in chronic kidney disease: Secondary | ICD-10-CM | POA: Diagnosis present

## 2018-09-27 DIAGNOSIS — Z20828 Contact with and (suspected) exposure to other viral communicable diseases: Secondary | ICD-10-CM | POA: Diagnosis present

## 2018-09-27 DIAGNOSIS — R079 Chest pain, unspecified: Secondary | ICD-10-CM | POA: Diagnosis not present

## 2018-09-27 DIAGNOSIS — M1990 Unspecified osteoarthritis, unspecified site: Secondary | ICD-10-CM | POA: Diagnosis present

## 2018-09-27 DIAGNOSIS — G4733 Obstructive sleep apnea (adult) (pediatric): Secondary | ICD-10-CM | POA: Diagnosis present

## 2018-09-27 DIAGNOSIS — E785 Hyperlipidemia, unspecified: Secondary | ICD-10-CM | POA: Diagnosis present

## 2018-09-27 DIAGNOSIS — R918 Other nonspecific abnormal finding of lung field: Secondary | ICD-10-CM | POA: Diagnosis not present

## 2018-09-27 DIAGNOSIS — R0902 Hypoxemia: Secondary | ICD-10-CM | POA: Diagnosis present

## 2018-09-27 DIAGNOSIS — Z87891 Personal history of nicotine dependence: Secondary | ICD-10-CM | POA: Diagnosis not present

## 2018-09-27 DIAGNOSIS — Z86718 Personal history of other venous thrombosis and embolism: Secondary | ICD-10-CM | POA: Diagnosis not present

## 2018-09-27 DIAGNOSIS — R0602 Shortness of breath: Secondary | ICD-10-CM | POA: Diagnosis present

## 2018-09-27 DIAGNOSIS — Z942 Lung transplant status: Secondary | ICD-10-CM | POA: Diagnosis not present

## 2018-09-27 DIAGNOSIS — R42 Dizziness and giddiness: Secondary | ICD-10-CM | POA: Diagnosis not present

## 2018-09-27 DIAGNOSIS — I120 Hypertensive chronic kidney disease with stage 5 chronic kidney disease or end stage renal disease: Secondary | ICD-10-CM | POA: Diagnosis present

## 2018-09-27 DIAGNOSIS — D7589 Other specified diseases of blood and blood-forming organs: Secondary | ICD-10-CM | POA: Diagnosis present

## 2018-09-27 DIAGNOSIS — T86810 Lung transplant rejection: Secondary | ICD-10-CM | POA: Diagnosis present

## 2018-09-27 DIAGNOSIS — N250 Renal osteodystrophy: Secondary | ICD-10-CM | POA: Diagnosis present

## 2018-09-27 DIAGNOSIS — Z8711 Personal history of peptic ulcer disease: Secondary | ICD-10-CM | POA: Diagnosis not present

## 2018-09-27 DIAGNOSIS — R5381 Other malaise: Secondary | ICD-10-CM | POA: Diagnosis present

## 2018-09-27 DIAGNOSIS — R6883 Chills (without fever): Secondary | ICD-10-CM | POA: Diagnosis present

## 2018-09-27 DIAGNOSIS — N186 End stage renal disease: Secondary | ICD-10-CM | POA: Diagnosis present

## 2018-10-01 DIAGNOSIS — E876 Hypokalemia: Secondary | ICD-10-CM | POA: Diagnosis not present

## 2018-10-01 DIAGNOSIS — N186 End stage renal disease: Secondary | ICD-10-CM | POA: Diagnosis not present

## 2018-10-01 DIAGNOSIS — T8579XA Infection and inflammatory reaction due to other internal prosthetic devices, implants and grafts, initial encounter: Secondary | ICD-10-CM | POA: Diagnosis not present

## 2018-10-01 DIAGNOSIS — N2581 Secondary hyperparathyroidism of renal origin: Secondary | ICD-10-CM | POA: Diagnosis not present

## 2018-10-03 DIAGNOSIS — N2581 Secondary hyperparathyroidism of renal origin: Secondary | ICD-10-CM | POA: Diagnosis not present

## 2018-10-03 DIAGNOSIS — T8579XA Infection and inflammatory reaction due to other internal prosthetic devices, implants and grafts, initial encounter: Secondary | ICD-10-CM | POA: Diagnosis not present

## 2018-10-03 DIAGNOSIS — E876 Hypokalemia: Secondary | ICD-10-CM | POA: Diagnosis not present

## 2018-10-03 DIAGNOSIS — N186 End stage renal disease: Secondary | ICD-10-CM | POA: Diagnosis not present

## 2018-10-05 DIAGNOSIS — T86819 Unspecified complication of lung transplant: Secondary | ICD-10-CM | POA: Diagnosis not present

## 2018-10-05 DIAGNOSIS — D509 Iron deficiency anemia, unspecified: Secondary | ICD-10-CM | POA: Diagnosis not present

## 2018-10-05 DIAGNOSIS — E876 Hypokalemia: Secondary | ICD-10-CM | POA: Diagnosis not present

## 2018-10-05 DIAGNOSIS — N2581 Secondary hyperparathyroidism of renal origin: Secondary | ICD-10-CM | POA: Diagnosis not present

## 2018-10-05 DIAGNOSIS — Z992 Dependence on renal dialysis: Secondary | ICD-10-CM | POA: Diagnosis not present

## 2018-10-05 DIAGNOSIS — N186 End stage renal disease: Secondary | ICD-10-CM | POA: Diagnosis not present

## 2018-10-05 DIAGNOSIS — T8579XA Infection and inflammatory reaction due to other internal prosthetic devices, implants and grafts, initial encounter: Secondary | ICD-10-CM | POA: Diagnosis not present

## 2018-10-08 DIAGNOSIS — E876 Hypokalemia: Secondary | ICD-10-CM | POA: Diagnosis not present

## 2018-10-08 DIAGNOSIS — N186 End stage renal disease: Secondary | ICD-10-CM | POA: Diagnosis not present

## 2018-10-08 DIAGNOSIS — D509 Iron deficiency anemia, unspecified: Secondary | ICD-10-CM | POA: Diagnosis not present

## 2018-10-08 DIAGNOSIS — N2581 Secondary hyperparathyroidism of renal origin: Secondary | ICD-10-CM | POA: Diagnosis not present

## 2018-10-08 DIAGNOSIS — T8579XA Infection and inflammatory reaction due to other internal prosthetic devices, implants and grafts, initial encounter: Secondary | ICD-10-CM | POA: Diagnosis not present

## 2018-10-10 DIAGNOSIS — N2581 Secondary hyperparathyroidism of renal origin: Secondary | ICD-10-CM | POA: Diagnosis not present

## 2018-10-10 DIAGNOSIS — D509 Iron deficiency anemia, unspecified: Secondary | ICD-10-CM | POA: Diagnosis not present

## 2018-10-10 DIAGNOSIS — N186 End stage renal disease: Secondary | ICD-10-CM | POA: Diagnosis not present

## 2018-10-10 DIAGNOSIS — T8579XA Infection and inflammatory reaction due to other internal prosthetic devices, implants and grafts, initial encounter: Secondary | ICD-10-CM | POA: Diagnosis not present

## 2018-10-10 DIAGNOSIS — E876 Hypokalemia: Secondary | ICD-10-CM | POA: Diagnosis not present

## 2018-10-12 DIAGNOSIS — D509 Iron deficiency anemia, unspecified: Secondary | ICD-10-CM | POA: Diagnosis not present

## 2018-10-12 DIAGNOSIS — E876 Hypokalemia: Secondary | ICD-10-CM | POA: Diagnosis not present

## 2018-10-12 DIAGNOSIS — N2581 Secondary hyperparathyroidism of renal origin: Secondary | ICD-10-CM | POA: Diagnosis not present

## 2018-10-12 DIAGNOSIS — N186 End stage renal disease: Secondary | ICD-10-CM | POA: Diagnosis not present

## 2018-10-12 DIAGNOSIS — T8579XA Infection and inflammatory reaction due to other internal prosthetic devices, implants and grafts, initial encounter: Secondary | ICD-10-CM | POA: Diagnosis not present

## 2018-10-15 DIAGNOSIS — N2581 Secondary hyperparathyroidism of renal origin: Secondary | ICD-10-CM | POA: Diagnosis not present

## 2018-10-15 DIAGNOSIS — E876 Hypokalemia: Secondary | ICD-10-CM | POA: Diagnosis not present

## 2018-10-15 DIAGNOSIS — T8579XA Infection and inflammatory reaction due to other internal prosthetic devices, implants and grafts, initial encounter: Secondary | ICD-10-CM | POA: Diagnosis not present

## 2018-10-15 DIAGNOSIS — N186 End stage renal disease: Secondary | ICD-10-CM | POA: Diagnosis not present

## 2018-10-15 DIAGNOSIS — D509 Iron deficiency anemia, unspecified: Secondary | ICD-10-CM | POA: Diagnosis not present

## 2018-10-17 DIAGNOSIS — D509 Iron deficiency anemia, unspecified: Secondary | ICD-10-CM | POA: Diagnosis not present

## 2018-10-17 DIAGNOSIS — T8579XA Infection and inflammatory reaction due to other internal prosthetic devices, implants and grafts, initial encounter: Secondary | ICD-10-CM | POA: Diagnosis not present

## 2018-10-17 DIAGNOSIS — N186 End stage renal disease: Secondary | ICD-10-CM | POA: Diagnosis not present

## 2018-10-17 DIAGNOSIS — N2581 Secondary hyperparathyroidism of renal origin: Secondary | ICD-10-CM | POA: Diagnosis not present

## 2018-10-17 DIAGNOSIS — E876 Hypokalemia: Secondary | ICD-10-CM | POA: Diagnosis not present

## 2018-10-19 DIAGNOSIS — D509 Iron deficiency anemia, unspecified: Secondary | ICD-10-CM | POA: Diagnosis not present

## 2018-10-19 DIAGNOSIS — T8579XA Infection and inflammatory reaction due to other internal prosthetic devices, implants and grafts, initial encounter: Secondary | ICD-10-CM | POA: Diagnosis not present

## 2018-10-19 DIAGNOSIS — N2581 Secondary hyperparathyroidism of renal origin: Secondary | ICD-10-CM | POA: Diagnosis not present

## 2018-10-19 DIAGNOSIS — N186 End stage renal disease: Secondary | ICD-10-CM | POA: Diagnosis not present

## 2018-10-19 DIAGNOSIS — E876 Hypokalemia: Secondary | ICD-10-CM | POA: Diagnosis not present

## 2018-10-22 DIAGNOSIS — T8579XA Infection and inflammatory reaction due to other internal prosthetic devices, implants and grafts, initial encounter: Secondary | ICD-10-CM | POA: Diagnosis not present

## 2018-10-22 DIAGNOSIS — E876 Hypokalemia: Secondary | ICD-10-CM | POA: Diagnosis not present

## 2018-10-22 DIAGNOSIS — D509 Iron deficiency anemia, unspecified: Secondary | ICD-10-CM | POA: Diagnosis not present

## 2018-10-22 DIAGNOSIS — N2581 Secondary hyperparathyroidism of renal origin: Secondary | ICD-10-CM | POA: Diagnosis not present

## 2018-10-22 DIAGNOSIS — N186 End stage renal disease: Secondary | ICD-10-CM | POA: Diagnosis not present

## 2018-10-23 DIAGNOSIS — R972 Elevated prostate specific antigen [PSA]: Secondary | ICD-10-CM | POA: Diagnosis not present

## 2018-10-24 DIAGNOSIS — N2581 Secondary hyperparathyroidism of renal origin: Secondary | ICD-10-CM | POA: Diagnosis not present

## 2018-10-24 DIAGNOSIS — T8579XA Infection and inflammatory reaction due to other internal prosthetic devices, implants and grafts, initial encounter: Secondary | ICD-10-CM | POA: Diagnosis not present

## 2018-10-24 DIAGNOSIS — D509 Iron deficiency anemia, unspecified: Secondary | ICD-10-CM | POA: Diagnosis not present

## 2018-10-24 DIAGNOSIS — N186 End stage renal disease: Secondary | ICD-10-CM | POA: Diagnosis not present

## 2018-10-24 DIAGNOSIS — E876 Hypokalemia: Secondary | ICD-10-CM | POA: Diagnosis not present

## 2018-10-25 DIAGNOSIS — N19 Unspecified kidney failure: Secondary | ICD-10-CM | POA: Diagnosis not present

## 2018-10-25 DIAGNOSIS — Z942 Lung transplant status: Secondary | ICD-10-CM | POA: Diagnosis not present

## 2018-10-25 DIAGNOSIS — Z298 Encounter for other specified prophylactic measures: Secondary | ICD-10-CM | POA: Diagnosis not present

## 2018-10-25 DIAGNOSIS — Z5181 Encounter for therapeutic drug level monitoring: Secondary | ICD-10-CM | POA: Diagnosis not present

## 2018-10-25 DIAGNOSIS — Z79899 Other long term (current) drug therapy: Secondary | ICD-10-CM | POA: Diagnosis not present

## 2018-10-25 DIAGNOSIS — T86819 Unspecified complication of lung transplant: Secondary | ICD-10-CM | POA: Diagnosis not present

## 2018-10-26 DIAGNOSIS — N186 End stage renal disease: Secondary | ICD-10-CM | POA: Diagnosis not present

## 2018-10-26 DIAGNOSIS — N2581 Secondary hyperparathyroidism of renal origin: Secondary | ICD-10-CM | POA: Diagnosis not present

## 2018-10-26 DIAGNOSIS — D509 Iron deficiency anemia, unspecified: Secondary | ICD-10-CM | POA: Diagnosis not present

## 2018-10-26 DIAGNOSIS — E876 Hypokalemia: Secondary | ICD-10-CM | POA: Diagnosis not present

## 2018-10-26 DIAGNOSIS — T8579XA Infection and inflammatory reaction due to other internal prosthetic devices, implants and grafts, initial encounter: Secondary | ICD-10-CM | POA: Diagnosis not present

## 2018-10-29 DIAGNOSIS — E876 Hypokalemia: Secondary | ICD-10-CM | POA: Diagnosis not present

## 2018-10-29 DIAGNOSIS — T8579XA Infection and inflammatory reaction due to other internal prosthetic devices, implants and grafts, initial encounter: Secondary | ICD-10-CM | POA: Diagnosis not present

## 2018-10-29 DIAGNOSIS — D509 Iron deficiency anemia, unspecified: Secondary | ICD-10-CM | POA: Diagnosis not present

## 2018-10-29 DIAGNOSIS — N186 End stage renal disease: Secondary | ICD-10-CM | POA: Diagnosis not present

## 2018-10-29 DIAGNOSIS — N2581 Secondary hyperparathyroidism of renal origin: Secondary | ICD-10-CM | POA: Diagnosis not present

## 2018-10-31 DIAGNOSIS — N2581 Secondary hyperparathyroidism of renal origin: Secondary | ICD-10-CM | POA: Diagnosis not present

## 2018-10-31 DIAGNOSIS — T8579XA Infection and inflammatory reaction due to other internal prosthetic devices, implants and grafts, initial encounter: Secondary | ICD-10-CM | POA: Diagnosis not present

## 2018-10-31 DIAGNOSIS — N186 End stage renal disease: Secondary | ICD-10-CM | POA: Diagnosis not present

## 2018-10-31 DIAGNOSIS — D509 Iron deficiency anemia, unspecified: Secondary | ICD-10-CM | POA: Diagnosis not present

## 2018-10-31 DIAGNOSIS — E876 Hypokalemia: Secondary | ICD-10-CM | POA: Diagnosis not present

## 2018-11-02 DIAGNOSIS — E876 Hypokalemia: Secondary | ICD-10-CM | POA: Diagnosis not present

## 2018-11-02 DIAGNOSIS — N186 End stage renal disease: Secondary | ICD-10-CM | POA: Diagnosis not present

## 2018-11-02 DIAGNOSIS — T8579XA Infection and inflammatory reaction due to other internal prosthetic devices, implants and grafts, initial encounter: Secondary | ICD-10-CM | POA: Diagnosis not present

## 2018-11-02 DIAGNOSIS — D509 Iron deficiency anemia, unspecified: Secondary | ICD-10-CM | POA: Diagnosis not present

## 2018-11-02 DIAGNOSIS — N2581 Secondary hyperparathyroidism of renal origin: Secondary | ICD-10-CM | POA: Diagnosis not present

## 2018-11-05 DIAGNOSIS — E876 Hypokalemia: Secondary | ICD-10-CM | POA: Diagnosis not present

## 2018-11-05 DIAGNOSIS — N186 End stage renal disease: Secondary | ICD-10-CM | POA: Diagnosis not present

## 2018-11-05 DIAGNOSIS — T8579XA Infection and inflammatory reaction due to other internal prosthetic devices, implants and grafts, initial encounter: Secondary | ICD-10-CM | POA: Diagnosis not present

## 2018-11-05 DIAGNOSIS — Z992 Dependence on renal dialysis: Secondary | ICD-10-CM | POA: Diagnosis not present

## 2018-11-05 DIAGNOSIS — N2581 Secondary hyperparathyroidism of renal origin: Secondary | ICD-10-CM | POA: Diagnosis not present

## 2018-11-05 DIAGNOSIS — T86819 Unspecified complication of lung transplant: Secondary | ICD-10-CM | POA: Diagnosis not present

## 2018-11-07 DIAGNOSIS — N186 End stage renal disease: Secondary | ICD-10-CM | POA: Diagnosis not present

## 2018-11-07 DIAGNOSIS — E876 Hypokalemia: Secondary | ICD-10-CM | POA: Diagnosis not present

## 2018-11-07 DIAGNOSIS — T8579XA Infection and inflammatory reaction due to other internal prosthetic devices, implants and grafts, initial encounter: Secondary | ICD-10-CM | POA: Diagnosis not present

## 2018-11-07 DIAGNOSIS — N2581 Secondary hyperparathyroidism of renal origin: Secondary | ICD-10-CM | POA: Diagnosis not present

## 2018-11-09 DIAGNOSIS — T8579XA Infection and inflammatory reaction due to other internal prosthetic devices, implants and grafts, initial encounter: Secondary | ICD-10-CM | POA: Diagnosis not present

## 2018-11-09 DIAGNOSIS — E876 Hypokalemia: Secondary | ICD-10-CM | POA: Diagnosis not present

## 2018-11-09 DIAGNOSIS — N2581 Secondary hyperparathyroidism of renal origin: Secondary | ICD-10-CM | POA: Diagnosis not present

## 2018-11-09 DIAGNOSIS — N186 End stage renal disease: Secondary | ICD-10-CM | POA: Diagnosis not present

## 2018-11-12 DIAGNOSIS — N186 End stage renal disease: Secondary | ICD-10-CM | POA: Diagnosis not present

## 2018-11-12 DIAGNOSIS — T8579XA Infection and inflammatory reaction due to other internal prosthetic devices, implants and grafts, initial encounter: Secondary | ICD-10-CM | POA: Diagnosis not present

## 2018-11-12 DIAGNOSIS — E876 Hypokalemia: Secondary | ICD-10-CM | POA: Diagnosis not present

## 2018-11-12 DIAGNOSIS — N2581 Secondary hyperparathyroidism of renal origin: Secondary | ICD-10-CM | POA: Diagnosis not present

## 2018-11-13 DIAGNOSIS — D899 Disorder involving the immune mechanism, unspecified: Secondary | ICD-10-CM | POA: Diagnosis not present

## 2018-11-13 DIAGNOSIS — Z95828 Presence of other vascular implants and grafts: Secondary | ICD-10-CM | POA: Diagnosis not present

## 2018-11-13 DIAGNOSIS — J8410 Pulmonary fibrosis, unspecified: Secondary | ICD-10-CM | POA: Diagnosis not present

## 2018-11-13 DIAGNOSIS — Z79899 Other long term (current) drug therapy: Secondary | ICD-10-CM | POA: Diagnosis not present

## 2018-11-13 DIAGNOSIS — Z942 Lung transplant status: Secondary | ICD-10-CM | POA: Diagnosis not present

## 2018-11-13 DIAGNOSIS — Z4824 Encounter for aftercare following lung transplant: Secondary | ICD-10-CM | POA: Diagnosis not present

## 2018-11-14 DIAGNOSIS — N2581 Secondary hyperparathyroidism of renal origin: Secondary | ICD-10-CM | POA: Diagnosis not present

## 2018-11-14 DIAGNOSIS — N186 End stage renal disease: Secondary | ICD-10-CM | POA: Diagnosis not present

## 2018-11-14 DIAGNOSIS — T8579XA Infection and inflammatory reaction due to other internal prosthetic devices, implants and grafts, initial encounter: Secondary | ICD-10-CM | POA: Diagnosis not present

## 2018-11-14 DIAGNOSIS — E876 Hypokalemia: Secondary | ICD-10-CM | POA: Diagnosis not present

## 2018-11-16 DIAGNOSIS — N2581 Secondary hyperparathyroidism of renal origin: Secondary | ICD-10-CM | POA: Diagnosis not present

## 2018-11-16 DIAGNOSIS — E876 Hypokalemia: Secondary | ICD-10-CM | POA: Diagnosis not present

## 2018-11-16 DIAGNOSIS — N186 End stage renal disease: Secondary | ICD-10-CM | POA: Diagnosis not present

## 2018-11-16 DIAGNOSIS — T8579XA Infection and inflammatory reaction due to other internal prosthetic devices, implants and grafts, initial encounter: Secondary | ICD-10-CM | POA: Diagnosis not present

## 2018-11-19 DIAGNOSIS — N186 End stage renal disease: Secondary | ICD-10-CM | POA: Diagnosis not present

## 2018-11-19 DIAGNOSIS — T8579XA Infection and inflammatory reaction due to other internal prosthetic devices, implants and grafts, initial encounter: Secondary | ICD-10-CM | POA: Diagnosis not present

## 2018-11-19 DIAGNOSIS — E876 Hypokalemia: Secondary | ICD-10-CM | POA: Diagnosis not present

## 2018-11-19 DIAGNOSIS — N2581 Secondary hyperparathyroidism of renal origin: Secondary | ICD-10-CM | POA: Diagnosis not present

## 2018-11-21 DIAGNOSIS — N186 End stage renal disease: Secondary | ICD-10-CM | POA: Diagnosis not present

## 2018-11-21 DIAGNOSIS — T8579XA Infection and inflammatory reaction due to other internal prosthetic devices, implants and grafts, initial encounter: Secondary | ICD-10-CM | POA: Diagnosis not present

## 2018-11-21 DIAGNOSIS — E876 Hypokalemia: Secondary | ICD-10-CM | POA: Diagnosis not present

## 2018-11-21 DIAGNOSIS — N2581 Secondary hyperparathyroidism of renal origin: Secondary | ICD-10-CM | POA: Diagnosis not present

## 2018-11-23 DIAGNOSIS — T8579XA Infection and inflammatory reaction due to other internal prosthetic devices, implants and grafts, initial encounter: Secondary | ICD-10-CM | POA: Diagnosis not present

## 2018-11-23 DIAGNOSIS — N186 End stage renal disease: Secondary | ICD-10-CM | POA: Diagnosis not present

## 2018-11-23 DIAGNOSIS — E876 Hypokalemia: Secondary | ICD-10-CM | POA: Diagnosis not present

## 2018-11-23 DIAGNOSIS — N2581 Secondary hyperparathyroidism of renal origin: Secondary | ICD-10-CM | POA: Diagnosis not present

## 2018-11-26 DIAGNOSIS — E876 Hypokalemia: Secondary | ICD-10-CM | POA: Diagnosis not present

## 2018-11-26 DIAGNOSIS — N2581 Secondary hyperparathyroidism of renal origin: Secondary | ICD-10-CM | POA: Diagnosis not present

## 2018-11-26 DIAGNOSIS — N186 End stage renal disease: Secondary | ICD-10-CM | POA: Diagnosis not present

## 2018-11-26 DIAGNOSIS — T8579XA Infection and inflammatory reaction due to other internal prosthetic devices, implants and grafts, initial encounter: Secondary | ICD-10-CM | POA: Diagnosis not present

## 2018-11-28 DIAGNOSIS — E876 Hypokalemia: Secondary | ICD-10-CM | POA: Diagnosis not present

## 2018-11-28 DIAGNOSIS — N186 End stage renal disease: Secondary | ICD-10-CM | POA: Diagnosis not present

## 2018-11-28 DIAGNOSIS — N2581 Secondary hyperparathyroidism of renal origin: Secondary | ICD-10-CM | POA: Diagnosis not present

## 2018-11-28 DIAGNOSIS — T8579XA Infection and inflammatory reaction due to other internal prosthetic devices, implants and grafts, initial encounter: Secondary | ICD-10-CM | POA: Diagnosis not present

## 2018-11-30 DIAGNOSIS — E876 Hypokalemia: Secondary | ICD-10-CM | POA: Diagnosis not present

## 2018-11-30 DIAGNOSIS — N2581 Secondary hyperparathyroidism of renal origin: Secondary | ICD-10-CM | POA: Diagnosis not present

## 2018-11-30 DIAGNOSIS — N186 End stage renal disease: Secondary | ICD-10-CM | POA: Diagnosis not present

## 2018-11-30 DIAGNOSIS — T8579XA Infection and inflammatory reaction due to other internal prosthetic devices, implants and grafts, initial encounter: Secondary | ICD-10-CM | POA: Diagnosis not present

## 2018-12-03 DIAGNOSIS — N186 End stage renal disease: Secondary | ICD-10-CM | POA: Diagnosis not present

## 2018-12-03 DIAGNOSIS — E876 Hypokalemia: Secondary | ICD-10-CM | POA: Diagnosis not present

## 2018-12-03 DIAGNOSIS — T8579XA Infection and inflammatory reaction due to other internal prosthetic devices, implants and grafts, initial encounter: Secondary | ICD-10-CM | POA: Diagnosis not present

## 2018-12-03 DIAGNOSIS — N2581 Secondary hyperparathyroidism of renal origin: Secondary | ICD-10-CM | POA: Diagnosis not present

## 2018-12-05 DIAGNOSIS — Z992 Dependence on renal dialysis: Secondary | ICD-10-CM | POA: Diagnosis not present

## 2018-12-05 DIAGNOSIS — T8579XA Infection and inflammatory reaction due to other internal prosthetic devices, implants and grafts, initial encounter: Secondary | ICD-10-CM | POA: Diagnosis not present

## 2018-12-05 DIAGNOSIS — N186 End stage renal disease: Secondary | ICD-10-CM | POA: Diagnosis not present

## 2018-12-05 DIAGNOSIS — E876 Hypokalemia: Secondary | ICD-10-CM | POA: Diagnosis not present

## 2018-12-05 DIAGNOSIS — N2581 Secondary hyperparathyroidism of renal origin: Secondary | ICD-10-CM | POA: Diagnosis not present

## 2018-12-05 DIAGNOSIS — T86819 Unspecified complication of lung transplant: Secondary | ICD-10-CM | POA: Diagnosis not present

## 2018-12-07 DIAGNOSIS — N186 End stage renal disease: Secondary | ICD-10-CM | POA: Diagnosis not present

## 2018-12-07 DIAGNOSIS — T8579XA Infection and inflammatory reaction due to other internal prosthetic devices, implants and grafts, initial encounter: Secondary | ICD-10-CM | POA: Diagnosis not present

## 2018-12-07 DIAGNOSIS — E876 Hypokalemia: Secondary | ICD-10-CM | POA: Diagnosis not present

## 2018-12-07 DIAGNOSIS — N2581 Secondary hyperparathyroidism of renal origin: Secondary | ICD-10-CM | POA: Diagnosis not present

## 2018-12-10 DIAGNOSIS — E876 Hypokalemia: Secondary | ICD-10-CM | POA: Diagnosis not present

## 2018-12-10 DIAGNOSIS — N186 End stage renal disease: Secondary | ICD-10-CM | POA: Diagnosis not present

## 2018-12-10 DIAGNOSIS — N2581 Secondary hyperparathyroidism of renal origin: Secondary | ICD-10-CM | POA: Diagnosis not present

## 2018-12-10 DIAGNOSIS — T8579XA Infection and inflammatory reaction due to other internal prosthetic devices, implants and grafts, initial encounter: Secondary | ICD-10-CM | POA: Diagnosis not present

## 2018-12-12 DIAGNOSIS — T8579XA Infection and inflammatory reaction due to other internal prosthetic devices, implants and grafts, initial encounter: Secondary | ICD-10-CM | POA: Diagnosis not present

## 2018-12-12 DIAGNOSIS — E876 Hypokalemia: Secondary | ICD-10-CM | POA: Diagnosis not present

## 2018-12-12 DIAGNOSIS — N2581 Secondary hyperparathyroidism of renal origin: Secondary | ICD-10-CM | POA: Diagnosis not present

## 2018-12-12 DIAGNOSIS — N186 End stage renal disease: Secondary | ICD-10-CM | POA: Diagnosis not present

## 2018-12-13 DIAGNOSIS — Z79899 Other long term (current) drug therapy: Secondary | ICD-10-CM | POA: Diagnosis not present

## 2018-12-13 DIAGNOSIS — T86819 Unspecified complication of lung transplant: Secondary | ICD-10-CM | POA: Diagnosis not present

## 2018-12-13 DIAGNOSIS — Z942 Lung transplant status: Secondary | ICD-10-CM | POA: Diagnosis not present

## 2018-12-13 DIAGNOSIS — Z298 Encounter for other specified prophylactic measures: Secondary | ICD-10-CM | POA: Diagnosis not present

## 2018-12-13 DIAGNOSIS — N19 Unspecified kidney failure: Secondary | ICD-10-CM | POA: Diagnosis not present

## 2018-12-13 DIAGNOSIS — Z5181 Encounter for therapeutic drug level monitoring: Secondary | ICD-10-CM | POA: Diagnosis not present

## 2018-12-14 DIAGNOSIS — T8579XA Infection and inflammatory reaction due to other internal prosthetic devices, implants and grafts, initial encounter: Secondary | ICD-10-CM | POA: Diagnosis not present

## 2018-12-14 DIAGNOSIS — N186 End stage renal disease: Secondary | ICD-10-CM | POA: Diagnosis not present

## 2018-12-14 DIAGNOSIS — N2581 Secondary hyperparathyroidism of renal origin: Secondary | ICD-10-CM | POA: Diagnosis not present

## 2018-12-14 DIAGNOSIS — E876 Hypokalemia: Secondary | ICD-10-CM | POA: Diagnosis not present

## 2018-12-17 DIAGNOSIS — N2581 Secondary hyperparathyroidism of renal origin: Secondary | ICD-10-CM | POA: Diagnosis not present

## 2018-12-17 DIAGNOSIS — E876 Hypokalemia: Secondary | ICD-10-CM | POA: Diagnosis not present

## 2018-12-17 DIAGNOSIS — T8579XA Infection and inflammatory reaction due to other internal prosthetic devices, implants and grafts, initial encounter: Secondary | ICD-10-CM | POA: Diagnosis not present

## 2018-12-17 DIAGNOSIS — N186 End stage renal disease: Secondary | ICD-10-CM | POA: Diagnosis not present

## 2018-12-19 DIAGNOSIS — T8579XA Infection and inflammatory reaction due to other internal prosthetic devices, implants and grafts, initial encounter: Secondary | ICD-10-CM | POA: Diagnosis not present

## 2018-12-19 DIAGNOSIS — N2581 Secondary hyperparathyroidism of renal origin: Secondary | ICD-10-CM | POA: Diagnosis not present

## 2018-12-19 DIAGNOSIS — E876 Hypokalemia: Secondary | ICD-10-CM | POA: Diagnosis not present

## 2018-12-19 DIAGNOSIS — N186 End stage renal disease: Secondary | ICD-10-CM | POA: Diagnosis not present

## 2018-12-21 DIAGNOSIS — N186 End stage renal disease: Secondary | ICD-10-CM | POA: Diagnosis not present

## 2018-12-21 DIAGNOSIS — N2581 Secondary hyperparathyroidism of renal origin: Secondary | ICD-10-CM | POA: Diagnosis not present

## 2018-12-21 DIAGNOSIS — E876 Hypokalemia: Secondary | ICD-10-CM | POA: Diagnosis not present

## 2018-12-21 DIAGNOSIS — T8579XA Infection and inflammatory reaction due to other internal prosthetic devices, implants and grafts, initial encounter: Secondary | ICD-10-CM | POA: Diagnosis not present

## 2018-12-24 DIAGNOSIS — N186 End stage renal disease: Secondary | ICD-10-CM | POA: Diagnosis not present

## 2018-12-24 DIAGNOSIS — N2581 Secondary hyperparathyroidism of renal origin: Secondary | ICD-10-CM | POA: Diagnosis not present

## 2018-12-24 DIAGNOSIS — E876 Hypokalemia: Secondary | ICD-10-CM | POA: Diagnosis not present

## 2018-12-24 DIAGNOSIS — T8579XA Infection and inflammatory reaction due to other internal prosthetic devices, implants and grafts, initial encounter: Secondary | ICD-10-CM | POA: Diagnosis not present

## 2018-12-26 DIAGNOSIS — N186 End stage renal disease: Secondary | ICD-10-CM | POA: Diagnosis not present

## 2018-12-26 DIAGNOSIS — T8579XA Infection and inflammatory reaction due to other internal prosthetic devices, implants and grafts, initial encounter: Secondary | ICD-10-CM | POA: Diagnosis not present

## 2018-12-26 DIAGNOSIS — E876 Hypokalemia: Secondary | ICD-10-CM | POA: Diagnosis not present

## 2018-12-26 DIAGNOSIS — N2581 Secondary hyperparathyroidism of renal origin: Secondary | ICD-10-CM | POA: Diagnosis not present

## 2018-12-28 DIAGNOSIS — N2581 Secondary hyperparathyroidism of renal origin: Secondary | ICD-10-CM | POA: Diagnosis not present

## 2018-12-28 DIAGNOSIS — T8579XA Infection and inflammatory reaction due to other internal prosthetic devices, implants and grafts, initial encounter: Secondary | ICD-10-CM | POA: Diagnosis not present

## 2018-12-28 DIAGNOSIS — N186 End stage renal disease: Secondary | ICD-10-CM | POA: Diagnosis not present

## 2018-12-28 DIAGNOSIS — E876 Hypokalemia: Secondary | ICD-10-CM | POA: Diagnosis not present

## 2018-12-31 DIAGNOSIS — E876 Hypokalemia: Secondary | ICD-10-CM | POA: Diagnosis not present

## 2018-12-31 DIAGNOSIS — N2581 Secondary hyperparathyroidism of renal origin: Secondary | ICD-10-CM | POA: Diagnosis not present

## 2018-12-31 DIAGNOSIS — N186 End stage renal disease: Secondary | ICD-10-CM | POA: Diagnosis not present

## 2018-12-31 DIAGNOSIS — T8579XA Infection and inflammatory reaction due to other internal prosthetic devices, implants and grafts, initial encounter: Secondary | ICD-10-CM | POA: Diagnosis not present

## 2019-01-01 DIAGNOSIS — H11823 Conjunctivochalasis, bilateral: Secondary | ICD-10-CM | POA: Diagnosis not present

## 2019-01-01 DIAGNOSIS — H4063X1 Glaucoma secondary to drugs, bilateral, mild stage: Secondary | ICD-10-CM | POA: Diagnosis not present

## 2019-01-01 DIAGNOSIS — E119 Type 2 diabetes mellitus without complications: Secondary | ICD-10-CM | POA: Diagnosis not present

## 2019-01-01 DIAGNOSIS — H04223 Epiphora due to insufficient drainage, bilateral lacrimal glands: Secondary | ICD-10-CM | POA: Diagnosis not present

## 2019-01-02 DIAGNOSIS — N2581 Secondary hyperparathyroidism of renal origin: Secondary | ICD-10-CM | POA: Diagnosis not present

## 2019-01-02 DIAGNOSIS — T8579XA Infection and inflammatory reaction due to other internal prosthetic devices, implants and grafts, initial encounter: Secondary | ICD-10-CM | POA: Diagnosis not present

## 2019-01-02 DIAGNOSIS — N186 End stage renal disease: Secondary | ICD-10-CM | POA: Diagnosis not present

## 2019-01-02 DIAGNOSIS — E876 Hypokalemia: Secondary | ICD-10-CM | POA: Diagnosis not present

## 2019-01-04 DIAGNOSIS — E876 Hypokalemia: Secondary | ICD-10-CM | POA: Diagnosis not present

## 2019-01-04 DIAGNOSIS — T8579XA Infection and inflammatory reaction due to other internal prosthetic devices, implants and grafts, initial encounter: Secondary | ICD-10-CM | POA: Diagnosis not present

## 2019-01-04 DIAGNOSIS — N186 End stage renal disease: Secondary | ICD-10-CM | POA: Diagnosis not present

## 2019-01-04 DIAGNOSIS — N2581 Secondary hyperparathyroidism of renal origin: Secondary | ICD-10-CM | POA: Diagnosis not present

## 2019-01-05 DIAGNOSIS — Z992 Dependence on renal dialysis: Secondary | ICD-10-CM | POA: Diagnosis not present

## 2019-01-05 DIAGNOSIS — N186 End stage renal disease: Secondary | ICD-10-CM | POA: Diagnosis not present

## 2019-01-05 DIAGNOSIS — T86819 Unspecified complication of lung transplant: Secondary | ICD-10-CM | POA: Diagnosis not present

## 2019-01-07 DIAGNOSIS — Z992 Dependence on renal dialysis: Secondary | ICD-10-CM | POA: Diagnosis not present

## 2019-01-07 DIAGNOSIS — T8579XA Infection and inflammatory reaction due to other internal prosthetic devices, implants and grafts, initial encounter: Secondary | ICD-10-CM | POA: Diagnosis not present

## 2019-01-07 DIAGNOSIS — N2581 Secondary hyperparathyroidism of renal origin: Secondary | ICD-10-CM | POA: Diagnosis not present

## 2019-01-07 DIAGNOSIS — E876 Hypokalemia: Secondary | ICD-10-CM | POA: Diagnosis not present

## 2019-01-07 DIAGNOSIS — N186 End stage renal disease: Secondary | ICD-10-CM | POA: Diagnosis not present

## 2019-01-07 DIAGNOSIS — D631 Anemia in chronic kidney disease: Secondary | ICD-10-CM | POA: Diagnosis not present

## 2019-01-09 DIAGNOSIS — E876 Hypokalemia: Secondary | ICD-10-CM | POA: Diagnosis not present

## 2019-01-09 DIAGNOSIS — N186 End stage renal disease: Secondary | ICD-10-CM | POA: Diagnosis not present

## 2019-01-09 DIAGNOSIS — T8579XA Infection and inflammatory reaction due to other internal prosthetic devices, implants and grafts, initial encounter: Secondary | ICD-10-CM | POA: Diagnosis not present

## 2019-01-09 DIAGNOSIS — Z992 Dependence on renal dialysis: Secondary | ICD-10-CM | POA: Diagnosis not present

## 2019-01-09 DIAGNOSIS — D631 Anemia in chronic kidney disease: Secondary | ICD-10-CM | POA: Diagnosis not present

## 2019-01-09 DIAGNOSIS — N2581 Secondary hyperparathyroidism of renal origin: Secondary | ICD-10-CM | POA: Diagnosis not present

## 2019-01-11 DIAGNOSIS — D631 Anemia in chronic kidney disease: Secondary | ICD-10-CM | POA: Diagnosis not present

## 2019-01-11 DIAGNOSIS — Z992 Dependence on renal dialysis: Secondary | ICD-10-CM | POA: Diagnosis not present

## 2019-01-11 DIAGNOSIS — E876 Hypokalemia: Secondary | ICD-10-CM | POA: Diagnosis not present

## 2019-01-11 DIAGNOSIS — N186 End stage renal disease: Secondary | ICD-10-CM | POA: Diagnosis not present

## 2019-01-11 DIAGNOSIS — T8579XA Infection and inflammatory reaction due to other internal prosthetic devices, implants and grafts, initial encounter: Secondary | ICD-10-CM | POA: Diagnosis not present

## 2019-01-11 DIAGNOSIS — N2581 Secondary hyperparathyroidism of renal origin: Secondary | ICD-10-CM | POA: Diagnosis not present

## 2019-01-13 ENCOUNTER — Encounter (HOSPITAL_COMMUNITY): Payer: Self-pay | Admitting: Emergency Medicine

## 2019-01-13 ENCOUNTER — Inpatient Hospital Stay (HOSPITAL_COMMUNITY)
Admission: EM | Admit: 2019-01-13 | Discharge: 2019-01-15 | DRG: 270 | Disposition: A | Discharge status: Home / Self Care | Payer: Medicare Other | Attending: Internal Medicine | Admitting: Internal Medicine

## 2019-01-13 ENCOUNTER — Other Ambulatory Visit: Payer: Self-pay

## 2019-01-13 DIAGNOSIS — Z86010 Personal history of colonic polyps: Secondary | ICD-10-CM | POA: Diagnosis not present

## 2019-01-13 DIAGNOSIS — Z87891 Personal history of nicotine dependence: Secondary | ICD-10-CM

## 2019-01-13 DIAGNOSIS — Z79899 Other long term (current) drug therapy: Secondary | ICD-10-CM | POA: Diagnosis not present

## 2019-01-13 DIAGNOSIS — T827XXA Infection and inflammatory reaction due to other cardiac and vascular devices, implants and grafts, initial encounter: Secondary | ICD-10-CM | POA: Diagnosis present

## 2019-01-13 DIAGNOSIS — E1122 Type 2 diabetes mellitus with diabetic chronic kidney disease: Secondary | ICD-10-CM | POA: Diagnosis present

## 2019-01-13 DIAGNOSIS — Y832 Surgical operation with anastomosis, bypass or graft as the cause of abnormal reaction of the patient, or of later complication, without mention of misadventure at the time of the procedure: Secondary | ICD-10-CM | POA: Diagnosis present

## 2019-01-13 DIAGNOSIS — Z992 Dependence on renal dialysis: Secondary | ICD-10-CM

## 2019-01-13 DIAGNOSIS — Z794 Long term (current) use of insulin: Secondary | ICD-10-CM | POA: Diagnosis not present

## 2019-01-13 DIAGNOSIS — Z942 Lung transplant status: Secondary | ICD-10-CM

## 2019-01-13 DIAGNOSIS — T82590A Other mechanical complication of surgically created arteriovenous fistula, initial encounter: Secondary | ICD-10-CM | POA: Diagnosis not present

## 2019-01-13 DIAGNOSIS — Z7982 Long term (current) use of aspirin: Secondary | ICD-10-CM | POA: Diagnosis not present

## 2019-01-13 DIAGNOSIS — I120 Hypertensive chronic kidney disease with stage 5 chronic kidney disease or end stage renal disease: Secondary | ICD-10-CM | POA: Diagnosis present

## 2019-01-13 DIAGNOSIS — Z6827 Body mass index (BMI) 27.0-27.9, adult: Secondary | ICD-10-CM | POA: Diagnosis not present

## 2019-01-13 DIAGNOSIS — E8889 Other specified metabolic disorders: Secondary | ICD-10-CM | POA: Diagnosis present

## 2019-01-13 DIAGNOSIS — K219 Gastro-esophageal reflux disease without esophagitis: Secondary | ICD-10-CM | POA: Diagnosis present

## 2019-01-13 DIAGNOSIS — G4733 Obstructive sleep apnea (adult) (pediatric): Secondary | ICD-10-CM | POA: Diagnosis present

## 2019-01-13 DIAGNOSIS — D638 Anemia in other chronic diseases classified elsewhere: Secondary | ICD-10-CM | POA: Diagnosis present

## 2019-01-13 DIAGNOSIS — Z7951 Long term (current) use of inhaled steroids: Secondary | ICD-10-CM | POA: Diagnosis not present

## 2019-01-13 DIAGNOSIS — T148XXA Other injury of unspecified body region, initial encounter: Secondary | ICD-10-CM | POA: Diagnosis not present

## 2019-01-13 DIAGNOSIS — T82898A Other specified complication of vascular prosthetic devices, implants and grafts, initial encounter: Secondary | ICD-10-CM | POA: Diagnosis not present

## 2019-01-13 DIAGNOSIS — Z881 Allergy status to other antibiotic agents status: Secondary | ICD-10-CM

## 2019-01-13 DIAGNOSIS — J849 Interstitial pulmonary disease, unspecified: Secondary | ICD-10-CM | POA: Diagnosis present

## 2019-01-13 DIAGNOSIS — D849 Immunodeficiency, unspecified: Secondary | ICD-10-CM | POA: Diagnosis present

## 2019-01-13 DIAGNOSIS — Z886 Allergy status to analgesic agent status: Secondary | ICD-10-CM

## 2019-01-13 DIAGNOSIS — N186 End stage renal disease: Secondary | ICD-10-CM | POA: Diagnosis present

## 2019-01-13 DIAGNOSIS — E785 Hyperlipidemia, unspecified: Secondary | ICD-10-CM | POA: Diagnosis present

## 2019-01-13 DIAGNOSIS — E7800 Pure hypercholesterolemia, unspecified: Secondary | ICD-10-CM | POA: Diagnosis present

## 2019-01-13 DIAGNOSIS — L089 Local infection of the skin and subcutaneous tissue, unspecified: Secondary | ICD-10-CM | POA: Diagnosis not present

## 2019-01-13 DIAGNOSIS — E669 Obesity, unspecified: Secondary | ICD-10-CM | POA: Diagnosis present

## 2019-01-13 DIAGNOSIS — D631 Anemia in chronic kidney disease: Secondary | ICD-10-CM | POA: Diagnosis not present

## 2019-01-13 DIAGNOSIS — N2581 Secondary hyperparathyroidism of renal origin: Secondary | ICD-10-CM | POA: Diagnosis present

## 2019-01-13 DIAGNOSIS — Z03818 Encounter for observation for suspected exposure to other biological agents ruled out: Secondary | ICD-10-CM | POA: Diagnosis not present

## 2019-01-13 DIAGNOSIS — Z800 Family history of malignant neoplasm of digestive organs: Secondary | ICD-10-CM

## 2019-01-13 DIAGNOSIS — Z20828 Contact with and (suspected) exposure to other viral communicable diseases: Secondary | ICD-10-CM | POA: Diagnosis present

## 2019-01-13 DIAGNOSIS — I10 Essential (primary) hypertension: Secondary | ICD-10-CM | POA: Diagnosis not present

## 2019-01-13 DIAGNOSIS — Z8249 Family history of ischemic heart disease and other diseases of the circulatory system: Secondary | ICD-10-CM

## 2019-01-13 LAB — COMPREHENSIVE METABOLIC PANEL
ALT: 39 U/L (ref 0–44)
AST: 35 U/L (ref 15–41)
Albumin: 3.9 g/dL (ref 3.5–5.0)
Alkaline Phosphatase: 199 U/L — ABNORMAL HIGH (ref 38–126)
Anion gap: 15 (ref 5–15)
BUN: 56 mg/dL — ABNORMAL HIGH (ref 8–23)
CO2: 24 mmol/L (ref 22–32)
Calcium: 9.5 mg/dL (ref 8.9–10.3)
Chloride: 98 mmol/L (ref 98–111)
Creatinine, Ser: 9.49 mg/dL — ABNORMAL HIGH (ref 0.61–1.24)
GFR calc Af Amer: 6 mL/min — ABNORMAL LOW (ref >60)
GFR calc non Af Amer: 5 mL/min — ABNORMAL LOW (ref >60)
Glucose, Bld: 200 mg/dL — ABNORMAL HIGH (ref 70–99)
Potassium: 4.2 mmol/L (ref 3.5–5.1)
Sodium: 137 mmol/L (ref 135–145)
Total Bilirubin: 1 mg/dL (ref 0.3–1.2)
Total Protein: 7.6 g/dL (ref 6.5–8.1)

## 2019-01-13 LAB — CBC WITH DIFFERENTIAL/PLATELET
Abs Immature Granulocytes: 0.03 10*3/uL (ref 0.00–0.07)
Basophils Absolute: 0 10*3/uL (ref 0.0–0.1)
Basophils Relative: 1 %
Eosinophils Absolute: 0 10*3/uL (ref 0.0–0.5)
Eosinophils Relative: 1 %
HCT: 36.6 % — ABNORMAL LOW (ref 39.0–52.0)
Hemoglobin: 11.9 g/dL — ABNORMAL LOW (ref 13.0–17.0)
Immature Granulocytes: 1 %
Lymphocytes Relative: 9 %
Lymphs Abs: 0.6 10*3/uL — ABNORMAL LOW (ref 0.7–4.0)
MCH: 35.3 pg — ABNORMAL HIGH (ref 26.0–34.0)
MCHC: 32.5 g/dL (ref 30.0–36.0)
MCV: 108.6 fL — ABNORMAL HIGH (ref 80.0–100.0)
Monocytes Absolute: 0.3 10*3/uL (ref 0.1–1.0)
Monocytes Relative: 5 %
Neutro Abs: 5.7 10*3/uL (ref 1.7–7.7)
Neutrophils Relative %: 83 %
Platelets: 179 10*3/uL (ref 150–400)
RBC: 3.37 MIL/uL — ABNORMAL LOW (ref 4.22–5.81)
RDW: 15 % (ref 11.5–15.5)
WBC: 6.7 10*3/uL (ref 4.0–10.5)
nRBC: 0 % (ref 0.0–0.2)

## 2019-01-13 LAB — SARS CORONAVIRUS 2 BY RT PCR (HOSPITAL ORDER, PERFORMED IN ~~LOC~~ HOSPITAL LAB): SARS Coronavirus 2: NEGATIVE

## 2019-01-13 LAB — GLUCOSE, CAPILLARY: Glucose-Capillary: 173 mg/dL — ABNORMAL HIGH (ref 70–99)

## 2019-01-13 MED ORDER — SODIUM CHLORIDE 0.9% FLUSH: 3.0000 mL | INTRAVENOUS | Status: DC | PRN

## 2019-01-13 MED ORDER — POLYVINYL ALCOHOL 1.4 % OP SOLN: 1.0000 [drp] | Freq: Every day | OPHTHALMIC | Status: DC | PRN

## 2019-01-13 MED ORDER — VANCOMYCIN VARIABLE DOSE PER UNSTABLE RENAL FUNCTION (PHARMACIST DOSING): Status: DC

## 2019-01-13 MED ORDER — ASPIRIN 81 MG PO CHEW
81.0000 mg | CHEWABLE_TABLET | Freq: Every day | ORAL | Status: DC
  Administered 2019-01-15: 81 mg via ORAL
  Filled 2019-01-13: qty 1

## 2019-01-13 MED ORDER — HEPARIN SODIUM (PORCINE) 5000 UNIT/ML IJ SOLN
5000.0000 [IU] | Freq: Three times a day (TID) | INTRAMUSCULAR | Status: DC
  Administered 2019-01-13 – 2019-01-15 (×2): 5000 [IU] via SUBCUTANEOUS
  Filled 2019-01-13 (×3): qty 1

## 2019-01-13 MED ORDER — CYCLOSPORINE 25 MG PO CAPS
75.0000 mg | ORAL_CAPSULE | Freq: Every day | ORAL | Status: DC
  Administered 2019-01-13 – 2019-01-14 (×2): 75 mg via ORAL
  Filled 2019-01-13 (×3): qty 3

## 2019-01-13 MED ORDER — INSULIN ASPART 100 UNIT/ML ~~LOC~~ SOLN: 0.0000 [IU] | Freq: Three times a day (TID) | SUBCUTANEOUS | Status: DC

## 2019-01-13 MED ORDER — ACETAMINOPHEN 500 MG PO TABS: 500.0000 mg | ORAL_TABLET | Freq: Four times a day (QID) | ORAL | Status: DC | PRN

## 2019-01-13 MED ORDER — VANCOMYCIN HCL 10 G IV SOLR
1500.0000 mg | Freq: Once | INTRAVENOUS | Status: AC
  Administered 2019-01-13: 1500 mg via INTRAVENOUS
  Filled 2019-01-13: qty 1500

## 2019-01-13 MED ORDER — SODIUM CHLORIDE 0.9% FLUSH
3.0000 mL | Freq: Two times a day (BID) | INTRAVENOUS | Status: DC
  Administered 2019-01-14 – 2019-01-15 (×3): 3 mL via INTRAVENOUS

## 2019-01-13 MED ORDER — CALCIUM ACETATE (PHOS BINDER) 667 MG PO CAPS
1334.0000 mg | ORAL_CAPSULE | Freq: Three times a day (TID) | ORAL | Status: DC
  Administered 2019-01-15: 1334 mg via ORAL
  Filled 2019-01-13 (×2): qty 2

## 2019-01-13 MED ORDER — ONDANSETRON HCL 4 MG PO TABS: 4.0000 mg | ORAL_TABLET | Freq: Four times a day (QID) | ORAL | Status: DC | PRN

## 2019-01-13 MED ORDER — CYCLOSPORINE 100 MG PO CAPS
100.0000 mg | ORAL_CAPSULE | Freq: Every morning | ORAL | Status: DC
  Administered 2019-01-15: 100 mg via ORAL
  Filled 2019-01-13 (×2): qty 1

## 2019-01-13 MED ORDER — PANTOPRAZOLE SODIUM 40 MG PO TBEC
40.0000 mg | DELAYED_RELEASE_TABLET | Freq: Every day | ORAL | Status: DC
  Administered 2019-01-15: 40 mg via ORAL
  Filled 2019-01-13: qty 1

## 2019-01-13 MED ORDER — ONDANSETRON HCL 4 MG/2ML IJ SOLN: 4.0000 mg | Freq: Four times a day (QID) | INTRAMUSCULAR | Status: DC | PRN

## 2019-01-13 MED ORDER — PREDNISOLONE ACETATE 1 % OP SUSP
1.0000 [drp] | Freq: Two times a day (BID) | OPHTHALMIC | Status: DC
  Administered 2019-01-13 – 2019-01-15 (×4): 1 [drp] via OPHTHALMIC
  Filled 2019-01-13: qty 5

## 2019-01-13 MED ORDER — SODIUM CHLORIDE 0.9 % IV SOLN
250.0000 mL | INTRAVENOUS | Status: DC | PRN
  Administered 2019-01-14: 17:00:00 via INTRAVENOUS

## 2019-01-13 MED ORDER — AZATHIOPRINE 50 MG PO TABS
25.0000 mg | ORAL_TABLET | ORAL | Status: DC
  Administered 2019-01-15: 25 mg via ORAL
  Filled 2019-01-13: qty 1

## 2019-01-13 MED ORDER — DORZOLAMIDE HCL 2 % OP SOLN
1.0000 [drp] | Freq: Two times a day (BID) | OPHTHALMIC | Status: DC
  Administered 2019-01-13 – 2019-01-15 (×4): 1 [drp] via OPHTHALMIC
  Filled 2019-01-13: qty 10

## 2019-01-13 MED ORDER — PRAVASTATIN SODIUM 10 MG PO TABS
20.0000 mg | ORAL_TABLET | Freq: Every day | ORAL | Status: DC
  Administered 2019-01-13 – 2019-01-14 (×2): 20 mg via ORAL
  Filled 2019-01-13 (×2): qty 2

## 2019-01-13 MED ORDER — VALGANCICLOVIR HCL 450 MG PO TABS
450.0000 mg | ORAL_TABLET | ORAL | Status: DC
  Administered 2019-01-15: 450 mg via ORAL
  Filled 2019-01-13: qty 1

## 2019-01-13 MED ORDER — SULFAMETHOXAZOLE-TRIMETHOPRIM 400-80 MG PO TABS
1.0000 | ORAL_TABLET | ORAL | Status: DC
  Administered 2019-01-15: 1 via ORAL
  Filled 2019-01-13: qty 1

## 2019-01-13 MED ORDER — CYCLOSPORINE 25 MG PO CAPS: 25.0000 mg | ORAL_CAPSULE | ORAL | Status: DC

## 2019-01-13 NOTE — ED Notes (Signed)
Pt with limited venous access. Charge RN to attempt PIV

## 2019-01-13 NOTE — ED Notes (Signed)
Patient's wife is agitated in the room at the temperature. RN explained that we have no control over the central temperature in the room, but apologized for the chill. RN gave patient 2 warm blankets and a heat pack.  Patient's wife stated "Well I don't want him getting pneumonia from y'all freezing him" RN explained to patient's wife that pneumonia is not caused from being physically cold, but from a virus or bacteria.  Patient's wife became more agitated and stated "That's what you think but I know what they told me at [facility]."

## 2019-01-13 NOTE — Progress Notes (Signed)
Pharmacy Antibiotic Note  [name] is a 67 y.o. male admitted on 01/13/2019 with infected dialysis fistula.  Pharmacy has been consulted for vancomycin dosing.  Plan: Vancomycin 1500mg  IV x 1, current CrCl < 87mls/min, pt on intermittent HD MWF -obtain vanc level in 48 hours or re-dose per HD guidelines  Height: 5\' 3"  (160 cm) Weight: 158 lb (71.7 kg) IBW/kg (Calculated) : 56.9  Temp (24hrs), Avg:98.3 F (36.8 C), Min:98.3 F (36.8 C), Max:98.3 F (36.8 C)  Recent Labs  Lab 01/13/19 1603  WBC 6.7  CREATININE 9.49*    Estimated Creatinine Clearance: 6.5 mL/min (A) (by C-G formula based on SCr of 9.49 mg/dL (H)).    Allergies  Allergen Reactions  . Levofloxacin Other (See Comments)    LOSS OF CONSCIOUSNESS  . Nsaids Other (See Comments)    Patient instructed not to take NSAID's after his lung transplant     Thank you for allowing pharmacy to be a part of this patient's care.  Dolly Rias RPh 01/13/2019, 6:37 PM Pager [phone]

## 2019-01-13 NOTE — H&P (Signed)
TRH H&P    Patient Demographics:    [name], is a 68 y.o. male  MRN: [id number]  DOB - February 20, 1951  Admit Date - 01/13/2019  Referring MD/NP/PA:   Outpatient Primary MD for the patient is Wenda Low, MD  Patient coming from: Home  Chief complaint-swelling of left arm fistula   HPI:    [name]  is a 68 y.o. 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  Patient states that he noted some drainage.  He is followed by vascular surgery as outpatient.  Denies any fever or chills. His last dialysis was on Friday. Denies nausea vomiting or diarrhea. Denies abdominal pain, no chest pain no shortness of breath. Vascular surgery was consulted by ED physician and recommended to transfer patient to [facility] for possible surgery in a.m.    Review of systems:    In addition to the HPI above,    All other systems reviewed and are negative.    Past History of the following :    Past Medical History:  Diagnosis Date  . Aortic valve disorders   . Benign neoplasm of colon   . Degeneration of intervertebral disc, site unspecified   . Diabetes mellitus without complication ([place])   . 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 Esophageal reflux   . ESRD (end stage renal disease) on dialysis ([place])   . Essential hypertension   . Obstructive sleep apnea (adult) (pediatric)   . Osteoarthrosis, unspecified whether generalized or localized, unspecified site   . Other and unspecified hyperlipidemia   . Pneumonia    interstitial pneumonia  . Pulmonary fibrosis ([place])   . Renal disorder   . Respiratory failure with  hypoxia ([place]) 12/2015  . Shortness of breath dyspnea   . Transplanted, lung ([place])   . Unspecified essential hypertension       Past Surgical History:  Procedure Laterality Date  . COLONOSCOPY WITH PROPOFOL N/A 11/07/2017   Procedure: COLONOSCOPY WITH PROPOFOL;  Surgeon: Ronnette Juniper, MD;  Location: [facility];  Service: Gastroenterology;  Laterality: N/A;  . LUNG BIOPSY  2010  . LUNG TRANSPLANT, SINGLE Right   . POLYPECTOMY  11/07/2017   Procedure: POLYPECTOMY;  Surgeon: Ronnette Juniper, MD;  Location: [facility];  Service: Gastroenterology;;      Social History:      Social History   Tobacco Use  . Smoking status: Former Smoker    Packs/day: 0.30    Years: 20.00    Pack years: 6.00    Types: Cigarettes    Quit date: 06/06/2002    Years since quitting: 16.6  . Smokeless tobacco: Never Used  Substance Use Topics  . Alcohol use: No    Alcohol/week: 0.0 standard drinks       Family History :  Family History  Problem Relation Age of Onset  . Pancreatic cancer Brother   . Heart disease Father       Home Medications:   Prior to Admission medications   Medication Sig Start Date End Date Taking? Authorizing Provider  acetaminophen (TYLENOL) 500 MG tablet Take 500-1,000 mg by mouth every 6 (six) hours as needed for headache.   Yes [provider]  aspirin 81 MG tablet Take 81 mg by mouth daily.    Yes [provider]  azaTHIOprine (IMURAN) 50 MG tablet Take 25 mg by mouth See admin instructions. Take 25mg  by mouth on Monday, Wednesday, and Friday after dialysis   Yes [provider]  calcium acetate (PHOSLO) 667 MG capsule Take 1,334 mg by mouth 3 (three) times daily before meals.    Yes [provider]  cycloSPORINE (SANDIMMUNE) 25 MG capsule Take 25 mg by mouth See admin instructions. Take 100mg  by mouth in the morning and 75mg  by mouth at night   Yes [provider]  dorzolamide (TRUSOPT) 2 % ophthalmic solution Place 1 drop  into both eyes 2 (two) times daily. 03/31/18  Yes [provider]  fluticasone (FLONASE) 50 MCG/ACT nasal spray Place 2 sprays into the nose daily. Patient taking differently: Place 2 sprays into the nose daily as needed for allergies.  03/05/13  Yes Wenda Low, MD  Immune Globulin 10% (PRIVIGEN) 20 GM/200ML SOLN Inject 0.5 g/kg into the vein every 3 (three) months.    Yes [provider]  Multiple Vitamin (MULTIVITAMIN) capsule Take 1 capsule by mouth daily.    Yes [provider]  omeprazole (PRILOSEC) 40 MG capsule Take 40 mg by mouth 3 (three) times daily with meals.  11/12/18  Yes [provider]  polyvinyl alcohol-povidone (REFRESH) 1.4-0.6 % ophthalmic solution Place 1-2 drops into both eyes daily as needed (for dryness).   Yes [provider]  pravastatin (PRAVACHOL) 20 MG tablet Take 20 mg by mouth at bedtime.    Yes [provider]  prednisoLONE acetate (PRED FORTE) 1 % ophthalmic suspension Place 1 drop into both eyes 2 (two) times daily.  04/02/18  Yes [provider]  sertraline (ZOLOFT) 50 MG tablet Take 50 mg by mouth daily as needed (depression).    Yes [provider]  sulfamethoxazole-trimethoprim (BACTRIM,SEPTRA) 400-80 MG tablet Take 1 tablet by mouth 3 (three) times a week. Monday, Wednesday, and Friday after dialysis 02/27/17  Yes [provider]  valGANciclovir (VALCYTE) 450 MG tablet Take 1 tablet by mouth See admin instructions. Every Monday and Friday AFTER DIALYSIS 03/26/15  Yes [provider]  [place] As directed up to 4 times daily 12/16/15   [provider]  insulin regular (NOVOLIN R) 100 units/mL injection Inject 5 Units into the skin 3 (three) times daily before meals. Additional units (per sliding scale): BGL 201-250 = 1 unit; 251-300 = 2 units; 301-350 = 3 units; 351-400 = units; >401 = 5 units + CALL LUNG COORDINATOR @ [facility]    [provider]     Allergies:     Allergies  Allergen Reactions  . Levofloxacin Other (See Comments)    LOSS OF CONSCIOUSNESS  . Nsaids Other (See Comments)    Patient instructed not to take NSAID's after his lung transplant     Physical Exam:   Vitals  Blood pressure (!) 148/74, pulse 81, temperature 98.3 F (36.8 C), temperature source Oral, resp. rate 18, height 5\' 3"  (1.6  m), weight 71.7 kg, SpO2 98 %.  1.  General: Appears in no acute distress  2. Psychiatric: Alert, oriented x3, intact insight and judgment  3. Neurologic: Cranial nerves II to XII grossly intact, motor strength 5/5 in all extremities  4. HEENMT:  Atraumatic normocephalic, extraocular muscles are intact  5. Respiratory : Clear to auscultation bilaterally  6. Cardiovascular : S1-S2, regular, no murmur auscultated  7. Gastrointestinal:  Abdomen is soft, nontender, no organomegaly  8. Skin:    Left arm AV fistula, warm to touch, ulcer with scab noted on the fistula nontender to palpation.  No visible discharge noted.  Positive thrill noted.      Data Review:    CBC Recent Labs  Lab 01/13/19 1603  WBC 6.7  HGB 11.9*  HCT 36.6*  PLT 179  MCV 108.6*  MCH 35.3*  MCHC 32.5  RDW 15.0  LYMPHSABS 0.6*  MONOABS 0.3  EOSABS 0.0  BASOSABS 0.0   ------------------------------------------------------------------------------------------------------------------  Results for orders placed or performed during the hospital encounter of 01/13/19 (from the past 48 hour(s))  CBC with Differential     Status: Abnormal   Collection Time: 01/13/19  4:03 PM  Result Value Ref Range   WBC 6.7 4.0 - 10.5 K/uL   RBC 3.37 (L) 4.22 - 5.81 MIL/uL   Hemoglobin 11.9 (L) 13.0 - 17.0 g/dL   HCT 36.6 (L) 39.0 - 52.0 %   MCV 108.6 (H) 80.0 - 100.0 fL   MCH 35.3 (H) 26.0 - 34.0 pg   MCHC 32.5 30.0 - 36.0 g/dL   RDW 15.0 11.5 - 15.5 %   Platelets 179 150 - 400 K/uL   nRBC 0.0 0.0 - 0.2 %   Neutrophils  Relative % 83 %   Neutro Abs 5.7 1.7 - 7.7 K/uL   Lymphocytes Relative 9 %   Lymphs Abs 0.6 (L) 0.7 - 4.0 K/uL   Monocytes Relative 5 %   Monocytes Absolute 0.3 0.1 - 1.0 K/uL   Eosinophils Relative 1 %   Eosinophils Absolute 0.0 0.0 - 0.5 K/uL   Basophils Relative 1 %   Basophils Absolute 0.0 0.0 - 0.1 K/uL   Immature Granulocytes 1 %   Abs Immature Granulocytes 0.03 0.00 - 0.07 K/uL    Comment: Performed at [facility], [place] [address] [place], [place] [address]  Comprehensive metabolic panel     Status: Abnormal   Collection Time: 01/13/19  4:03 PM  Result Value Ref Range   Sodium 137 135 - 145 mmol/L   Potassium 4.2 3.5 - 5.1 mmol/L   Chloride 98 98 - 111 mmol/L   CO2 24 22 - 32 mmol/L   Glucose, Bld 200 (H) 70 - 99 mg/dL   BUN 56 (H) 8 - 23 mg/dL   Creatinine, Ser 9.49 (H) 0.61 - 1.24 mg/dL   Calcium 9.5 8.9 - 10.3 mg/dL   Total Protein 7.6 6.5 - 8.1 g/dL   Albumin 3.9 3.5 - 5.0 g/dL   AST 35 15 - 41 U/L   ALT 39 0 - 44 U/L   Alkaline Phosphatase 199 (H) 38 - 126 U/L   Total Bilirubin 1.0 0.3 - 1.2 mg/dL   GFR calc non Af Amer 5 (L) >60 mL/min   GFR calc Af Amer 6 (L) >60 mL/min   Anion gap 15 5 - 15    Comment: Performed at [facility], [place] [address] [place], [place] [address]    Chemistries  Recent  Labs  Lab 01/13/19 1603  NA 137  K 4.2  CL 98  CO2 24  GLUCOSE 200*  BUN 56*  CREATININE 9.49*  CALCIUM 9.5  AST 35  ALT 39  ALKPHOS 199*  BILITOT 1.0   ------------------------------------------------------------------------------------------------------------------  ------------------------------------------------------------------------------------------------------------------ GFR: Estimated Creatinine Clearance: 6.5 mL/min (A) (by C-G formula based on SCr of 9.49 mg/dL (H)). Liver Function Tests: Recent Labs  Lab 01/13/19 1603  AST 35  ALT 39  ALKPHOS 199*  BILITOT 1.0  PROT 7.6  ALBUMIN 3.9    --------------------------------------------------------------------------------------------------------------- Urine analysis:    Component Value Date/Time   COLORURINE AMBER (A) 12/31/2015 0515   APPEARANCEUR CLOUDY (A) 12/31/2015 0515   LABSPEC 1.016 12/31/2015 0515   PHURINE 7.0 12/31/2015 0515   GLUCOSEU NEGATIVE 12/31/2015 0515   HGBUR NEGATIVE 12/31/2015 0515   BILIRUBINUR SMALL (A) 12/31/2015 0515   KETONESUR 15 (A) 12/31/2015 0515   PROTEINUR 100 (A) 12/31/2015 0515   UROBILINOGEN 2.0 (H) 08/15/2013 1916   NITRITE NEGATIVE 12/31/2015 0515   LEUKOCYTESUR NEGATIVE 12/31/2015 0515      Imaging Results:      Assessment & Plan:    Active Problems:   Wound infection   1. Infected AV fistula-patient was started on vancomycin per pharmacy consultation, transfer to [facility] for exploration of AV fistula by vascular surgery in a.m.  We will keep him n.p.o. after midnight.  2. History of lung transplant-continue azathioprine, cyclosporine, Bactrim, Valcyte  3. ESRD on hemodialysis-patient on hemodialysis Monday Wednesday Friday.  Called and discussed Dr. Jonnie Finner, patient to undergo dialysis in a.m.  4. Diabetes mellitus type 2-start sliding scale insulin with NovoLog.  Check CBG q. before meals and at bedtime.   Lung transplant specialist--Jamie Todd at [facility] can be reached at [phone] if needed.    DVT Prophylaxis-Heparin  AM Labs Ordered, also please review Full Orders  Family Communication: Admission, patients condition and plan of care including tests being ordered have been discussed with the patient and his wife at bedside* who indicate understanding and agree with the plan and Code Status.  Code Status: Full code  Admission status: Inpatient: Based on patients clinical presentation and evaluation of above clinical data, I have made determination that patient meets Inpatient criteria at this time.  Time spent in minutes : 60 minutes   Oswald Hillock M.D on  01/13/2019 at 6:24 PM

## 2019-01-13 NOTE — ED Triage Notes (Addendum)
Patient reports abscess over fistula x2 months. Reports drainage at night. Reports fistula is still being used for dialysis.

## 2019-01-13 NOTE — Progress Notes (Signed)
A consult was received from an ED physician for vanvomycin per pharmacy dosing.  The patient's profile has been reviewed for ht/wt/allergies/indication/available labs.    - pt's currently on intermittent HD MWF  A one time order has been placed for vancomycin 1500 mg IV x1.  Further antibiotics/pharmacy consults should be ordered by admitting physician if indicated.                       Thank you, Lynelle Doctor 01/13/2019  4:01 PM

## 2019-01-13 NOTE — ED Provider Notes (Signed)
[facility] DEPT Provider Note   CSN: [id number] Arrival date & time: 01/13/19  1159    History   Chief Complaint Chief Complaint  Patient presents with  . Wound Infection    HPI [name] is a 68 y.o. male.     HPI Patient presents for potentially infected dialysis fistula.  Over the last couple months patient has had some lesions on the inferior aspect of the fistula.  States he started has some drainage.  States it will be crusty in the morning but does not know if it is purulent or not.  States it has not been blood.  Has an appointment to see vascular surgery in 4 days.  He has a previous lung transplant is on immunosuppression.  The fistula was placed at [facility].  He has seen Dr. early in the past.  No fevers.  No chills.  He has been dialyzed as recently as Friday through these sites. Past Medical History:  Diagnosis Date  . Aortic valve disorders   . Benign neoplasm of colon   . Degeneration of intervertebral disc, site unspecified   . Diabetes mellitus without complication ([place])   . 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 Esophageal reflux   . ESRD (end stage renal disease) on dialysis ([place])   . Essential hypertension   . Obstructive sleep apnea (adult) (pediatric)   . Osteoarthrosis, unspecified whether generalized or localized, unspecified site   . Other and unspecified hyperlipidemia   . Pneumonia    interstitial pneumonia  . Pulmonary fibrosis ([place])   . Renal disorder   . Respiratory failure with hypoxia ([place]) 12/2015  . Shortness of breath dyspnea   . Transplanted, lung ([place])   . Unspecified essential hypertension     Patient Active Problem List   Diagnosis Date Noted  . Sepsis ([place])   . CKD (chronic kidney disease)   . Generalized weakness 12/30/2015  . ESRD (end stage renal disease) on dialysis ([place])   . 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[place])   . Acute respiratory failure with hypoxemia ([place]) 04/18/2015   . Septic shock ([place]) 04/18/2015  . Acute encephalopathy 04/18/2015  . Acute respiratory failure ([place]) 04/18/2015  . Cardiac arrest ([employer])   . HCAP (healthcare-associated pneumonia)   . Elevated rheumatoid factor 05/09/2014  . Essential hypertension 05/09/2014  . ILD (interstitial lung disease) ([place]) 05/09/2014  . Obstructive apnea 05/09/2014  . Lung nodule, solitary 04/18/2014  . Awaiting organ transplant 04/18/2014  . Acute on chronic respiratory failure with hypoxia ([place]) 08/15/2013  . Edema 07/05/2013  . Chronic respiratory failure ([place]) 03/03/2013  . Diabetes mellitus with complication ([place]) 02/40/9735  . Acute sinusitis 05/04/2011  . Cough 11/10/2010  . 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 Obstructive sleep apnea 10/09/2008  . ACID REFLUX DISEASE 10/09/2008  . HIATAL HERNIA 10/09/2008  . OSTEOARTHRITIS 10/09/2008    Past Surgical History:  Procedure Laterality Date  . COLONOSCOPY WITH PROPOFOL N/A 11/07/2017   Procedure: COLONOSCOPY WITH PROPOFOL;  Surgeon: Ronnette Juniper, MD;  Location: [facility];  Service: Gastroenterology;  Laterality: N/A;  . LUNG BIOPSY  2010  . LUNG TRANSPLANT, SINGLE Right   . POLYPECTOMY  11/07/2017   Procedure: POLYPECTOMY;  Surgeon: Ronnette Juniper, MD;  Location: [facility];  Service: Gastroenterology;;        Home Medications    Prior to Admission medications   Medication Sig Start Date End Date Taking? Authorizing Provider  acetaminophen (TYLENOL) 500 MG tablet  Take 500-1,000 mg by mouth every 6 (six) hours as needed for headache.   Yes [provider]  aspirin 81 MG tablet Take 81 mg by mouth daily.    Yes [provider]  azaTHIOprine (IMURAN) 50 MG tablet Take 25 mg by mouth See admin instructions. Take 25mg  by mouth on Monday, Wednesday, and Friday after dialysis   Yes [provider]  calcium acetate (PHOSLO) 667 MG capsule Take 1,334 mg by mouth 3 (three) times daily before meals.    Yes  [provider]  cycloSPORINE (SANDIMMUNE) 25 MG capsule Take 25 mg by mouth See admin instructions. Take 100mg  by mouth in the morning and 75mg  by mouth at night   Yes [provider]  dorzolamide (TRUSOPT) 2 % ophthalmic solution Place 1 drop into both eyes 2 (two) times daily. 03/31/18  Yes [provider]  fluticasone (FLONASE) 50 MCG/ACT nasal spray Place 2 sprays into the nose daily. Patient taking differently: Place 2 sprays into the nose daily as needed for allergies.  03/05/13  Yes Wenda Low, MD  Immune Globulin 10% (PRIVIGEN) 20 GM/200ML SOLN Inject 0.5 g/kg into the vein every 3 (three) months.    Yes [provider]  Multiple Vitamin (MULTIVITAMIN) capsule Take 1 capsule by mouth daily.    Yes [provider]  omeprazole (PRILOSEC) 40 MG capsule Take 40 mg by mouth 3 (three) times daily with meals.  11/12/18  Yes [provider]  polyvinyl alcohol-povidone (REFRESH) 1.4-0.6 % ophthalmic solution Place 1-2 drops into both eyes daily as needed (for dryness).   Yes [provider]  pravastatin (PRAVACHOL) 20 MG tablet Take 20 mg by mouth at bedtime.    Yes [provider]  prednisoLONE acetate (PRED FORTE) 1 % ophthalmic suspension Place 1 drop into both eyes 2 (two) times daily.  04/02/18  Yes [provider]  sertraline (ZOLOFT) 50 MG tablet Take 50 mg by mouth daily as needed (depression).    Yes [provider]  sulfamethoxazole-trimethoprim (BACTRIM,SEPTRA) 400-80 MG tablet Take 1 tablet by mouth 3 (three) times a week. Monday, Wednesday, and Friday after dialysis 02/27/17  Yes [provider]  valGANciclovir (VALCYTE) 450 MG tablet Take 1 tablet by mouth See admin instructions. Every Monday and Friday AFTER DIALYSIS 03/26/15  Yes [provider]  [place] As directed up to 4 times daily 12/16/15   [provider]  insulin regular (NOVOLIN R) 100  units/mL injection Inject 5 Units into the skin 3 (three) times daily before meals. Additional units (per sliding scale): BGL 201-250 = 1 unit; 251-300 = 2 units; 301-350 = 3 units; 351-400 = units; >401 = 5 units + CALL LUNG COORDINATOR @ [facility]    [provider]    Family History Family History  Problem Relation Age of Onset  . Pancreatic cancer Brother   . Heart disease Father     Social History Social History   Tobacco Use  . Smoking status: Former Smoker    Packs/day: 0.30    Years: 20.00    Pack years: 6.00    Types: Cigarettes    Quit date: 06/06/2002    Years since quitting: 16.6  . Smokeless tobacco: Never Used  Substance Use Topics  . Alcohol use: No    Alcohol/week: 0.0 standard drinks  . Drug use: No     Allergies   Levofloxacin and Nsaids   Review of Systems Review of Systems  Constitutional: Negative for  appetite change and fever.  HENT: Negative for congestion.   Respiratory: Negative for shortness of breath.   Cardiovascular: Negative for chest pain.  Genitourinary: Negative for flank pain.  Musculoskeletal: Negative for back pain.  Skin: Positive for wound.  Neurological: Negative for weakness.  Psychiatric/Behavioral: Negative for confusion.     Physical Exam Updated Vital Signs BP (!) 148/74 (BP Location: Right Arm)   Pulse 81   Temp 98.3 F (36.8 C) (Oral)   Resp 18   Ht 5\' 3"  (1.6 m)   Wt 71.7 kg   SpO2 98%   BMI 27.99 kg/m   Physical Exam Vitals signs and nursing note reviewed.  HENT:     Head: Atraumatic.  Neck:     Musculoskeletal: Neck supple.  Cardiovascular:     Rate and Rhythm: Regular rhythm.  Pulmonary:     Breath sounds: No wheezing or rhonchi.  Abdominal:     Tenderness: There is no abdominal tenderness.  Musculoskeletal:     Comments: Dialysis fistula left upper arm.  There is an ulcer with scab over the inferior aspect of the proximal aneurysm.  With palpation there is some mild purulent drainage on the  proximal aspect of this scab.  Good thrill.  Good distal pulses.  Skin:    General: Skin is warm.     Capillary Refill: Capillary refill takes less than 2 seconds.  Neurological:     Mental Status: He is alert and oriented to person, place, and time.        ED Treatments / Results  Labs (all labs ordered are listed, but only abnormal results are displayed) Labs Reviewed  CBC WITH DIFFERENTIAL/PLATELET - Abnormal; Notable for the following components:      Result Value   RBC 3.37 (*)    Hemoglobin 11.9 (*)    HCT 36.6 (*)    MCV 108.6 (*)    MCH 35.3 (*)    Lymphs Abs 0.6 (*)    All other components within normal limits  COMPREHENSIVE METABOLIC PANEL - Abnormal; Notable for the following components:   Glucose, Bld 200 (*)    BUN 56 (*)    Creatinine, Ser 9.49 (*)    Alkaline Phosphatase 199 (*)    GFR calc non Af Amer 5 (*)    GFR calc Af Amer 6 (*)    All other components within normal limits  SARS CORONAVIRUS 2 (HOSPITAL ORDER, [place] [facility])    EKG None  Radiology No results found.  Procedures Procedures (including critical care time)  Medications Ordered in ED Medications  vancomycin (VANCOCIN) 1,500 mg in sodium chloride 0.9 % 500 mL IVPB (1,500 mg Intravenous New Bag/Given 01/13/19 1649)     Initial Impression / Assessment and Plan / ED Course  I have reviewed the triage vital signs and the nursing notes.  Pertinent labs & imaging results that were available during my care of the patient were reviewed by me and considered in my medical decision making (see chart for details).       Patient with infected dialysis fistula.  Discussed with Dr. Trula Slade.  Will require transfer to [facility] and likely have surgery tomorrow.  Also discussed with Dr. Whitman Hero at [facility].  She is the lung transplant specialist.  She can be reached at [phone] if needed.  She feels patient be managed in our system and they can assist if further  help is needed.  She states she is on  for the next 2 weeks. Patient not having systemic symptoms.  Will admit to hospitalist and transfer to [facility]. Final Clinical Impressions(s) / ED Diagnoses   Final diagnoses:  Infection of arteriovenous dialysis fistula, initial encounter [facility])    ED Discharge Orders    None       Davonna Belling, MD 01/13/19 1712

## 2019-01-13 NOTE — ED Notes (Signed)
Wife states that pt can only drink [place] water and so pt has his own grey metal water bottle that will be transported to [facility] with him.

## 2019-01-14 ENCOUNTER — Inpatient Hospital Stay (HOSPITAL_COMMUNITY): Payer: Medicare Other | Admitting: Anesthesiology

## 2019-01-14 ENCOUNTER — Encounter (HOSPITAL_COMMUNITY): Payer: Self-pay

## 2019-01-14 ENCOUNTER — Encounter (HOSPITAL_COMMUNITY)
Admission: EM | Disposition: A | Discharge status: Home / Self Care | Payer: Self-pay | Source: Home / Self Care | Attending: Internal Medicine

## 2019-01-14 DIAGNOSIS — T148XXA Other injury of unspecified body region, initial encounter: Secondary | ICD-10-CM

## 2019-01-14 DIAGNOSIS — D849 Immunodeficiency, unspecified: Secondary | ICD-10-CM | POA: Diagnosis present

## 2019-01-14 DIAGNOSIS — I10 Essential (primary) hypertension: Secondary | ICD-10-CM

## 2019-01-14 DIAGNOSIS — N186 End stage renal disease: Secondary | ICD-10-CM

## 2019-01-14 DIAGNOSIS — T82898A Other specified complication of vascular prosthetic devices, implants and grafts, initial encounter: Secondary | ICD-10-CM

## 2019-01-14 DIAGNOSIS — L089 Local infection of the skin and subcutaneous tissue, unspecified: Secondary | ICD-10-CM

## 2019-01-14 DIAGNOSIS — Z992 Dependence on renal dialysis: Secondary | ICD-10-CM

## 2019-01-14 DIAGNOSIS — T827XXA Infection and inflammatory reaction due to other cardiac and vascular devices, implants and grafts, initial encounter: Secondary | ICD-10-CM | POA: Diagnosis present

## 2019-01-14 HISTORY — PX: LIGATION OF ARTERIOVENOUS  FISTULA: SHX5948

## 2019-01-14 LAB — GLUCOSE, CAPILLARY
Glucose-Capillary: 94 mg/dL (ref 70–99)
Glucose-Capillary: 93 mg/dL (ref 70–99)
Glucose-Capillary: 92 mg/dL (ref 70–99)
Glucose-Capillary: 78 mg/dL (ref 70–99)
Glucose-Capillary: 93 mg/dL (ref 70–99)

## 2019-01-14 LAB — HEMOGLOBIN A1C
Hgb A1c MFr Bld: 7.3 % — ABNORMAL HIGH (ref 4.8–5.6)
Mean Plasma Glucose: 162.81 mg/dL

## 2019-01-14 LAB — CBC
HCT: 32.3 % — ABNORMAL LOW (ref 39.0–52.0)
HCT: 32.3 % — ABNORMAL LOW (ref 39.0–52.0)
Hemoglobin: 10.6 g/dL — ABNORMAL LOW (ref 13.0–17.0)
Hemoglobin: 10.6 g/dL — ABNORMAL LOW (ref 13.0–17.0)
MCH: 34.3 pg — ABNORMAL HIGH (ref 26.0–34.0)
MCH: 34.8 pg — ABNORMAL HIGH (ref 26.0–34.0)
MCHC: 32.8 g/dL (ref 30.0–36.0)
MCHC: 32.8 g/dL (ref 30.0–36.0)
MCV: 104.5 fL — ABNORMAL HIGH (ref 80.0–100.0)
MCV: 105.9 fL — ABNORMAL HIGH (ref 80.0–100.0)
Platelets: 161 10*3/uL (ref 150–400)
Platelets: 159 10*3/uL (ref 150–400)
RBC: 3.09 MIL/uL — ABNORMAL LOW (ref 4.22–5.81)
RBC: 3.05 MIL/uL — ABNORMAL LOW (ref 4.22–5.81)
RDW: 14.8 % (ref 11.5–15.5)
RDW: 14.9 % (ref 11.5–15.5)
WBC: 5 10*3/uL (ref 4.0–10.5)
WBC: 4.6 10*3/uL (ref 4.0–10.5)
nRBC: 0 % (ref 0.0–0.2)
nRBC: 0 % (ref 0.0–0.2)

## 2019-01-14 LAB — PROTIME-INR
INR: 1 (ref 0.8–1.2)
Prothrombin Time: 13.2 seconds (ref 11.4–15.2)

## 2019-01-14 LAB — BASIC METABOLIC PANEL
Anion gap: 15 (ref 5–15)
BUN: 56 mg/dL — ABNORMAL HIGH (ref 8–23)
CO2: 23 mmol/L (ref 22–32)
Calcium: 8.8 mg/dL — ABNORMAL LOW (ref 8.9–10.3)
Chloride: 101 mmol/L (ref 98–111)
Creatinine, Ser: 10.62 mg/dL — ABNORMAL HIGH (ref 0.61–1.24)
GFR calc Af Amer: 5 mL/min — ABNORMAL LOW (ref >60)
GFR calc non Af Amer: 4 mL/min — ABNORMAL LOW (ref >60)
Glucose, Bld: 96 mg/dL (ref 70–99)
Potassium: 4.1 mmol/L (ref 3.5–5.1)
Sodium: 139 mmol/L (ref 135–145)

## 2019-01-14 LAB — RENAL FUNCTION PANEL
Albumin: 2.7 g/dL — ABNORMAL LOW (ref 3.5–5.0)
Anion gap: 17 — ABNORMAL HIGH (ref 5–15)
BUN: 64 mg/dL — ABNORMAL HIGH (ref 8–23)
CO2: 19 mmol/L — ABNORMAL LOW (ref 22–32)
Calcium: 8.2 mg/dL — ABNORMAL LOW (ref 8.9–10.3)
Chloride: 100 mmol/L (ref 98–111)
Creatinine, Ser: 11.95 mg/dL — ABNORMAL HIGH (ref 0.61–1.24)
GFR calc Af Amer: 4 mL/min — ABNORMAL LOW (ref >60)
GFR calc non Af Amer: 4 mL/min — ABNORMAL LOW (ref >60)
Glucose, Bld: 157 mg/dL — ABNORMAL HIGH (ref 70–99)
Phosphorus: 5.5 mg/dL — ABNORMAL HIGH (ref 2.5–4.6)
Potassium: 4.4 mmol/L (ref 3.5–5.1)
Sodium: 136 mmol/L (ref 135–145)

## 2019-01-14 LAB — SURGICAL PCR SCREEN
MRSA, PCR: NEGATIVE
Staphylococcus aureus: NEGATIVE

## 2019-01-14 LAB — HIV ANTIBODY (ROUTINE TESTING W REFLEX): HIV Screen 4th Generation wRfx: NONREACTIVE

## 2019-01-14 SURGERY — LIGATION OF ARTERIOVENOUS  FISTULA
Anesthesia: Monitor Anesthesia Care | Site: Arm Upper | Laterality: Left

## 2019-01-14 MED ORDER — VANCOMYCIN HCL IN DEXTROSE 750-5 MG/150ML-% IV SOLN: 750.0000 mg | INTRAVENOUS | Status: DC

## 2019-01-14 MED ORDER — ROCURONIUM BROMIDE 10 MG/ML (PF) SYRINGE
PREFILLED_SYRINGE | INTRAVENOUS | Status: AC
  Filled 2019-01-14: qty 30

## 2019-01-14 MED ORDER — PROPOFOL 10 MG/ML IV BOLUS
INTRAVENOUS | Status: AC
  Filled 2019-01-14: qty 20

## 2019-01-14 MED ORDER — EPHEDRINE 5 MG/ML INJ
INTRAVENOUS | Status: AC
  Filled 2019-01-14: qty 20

## 2019-01-14 MED ORDER — PIPERACILLIN-TAZOBACTAM 3.375 G IVPB
3.3750 g | Freq: Two times a day (BID) | INTRAVENOUS | Status: DC
  Administered 2019-01-15: 3.375 g via INTRAVENOUS
  Filled 2019-01-14: qty 50

## 2019-01-14 MED ORDER — STERILE WATER FOR IRRIGATION IR SOLN
Status: DC | PRN
  Administered 2019-01-14: 1000 mL

## 2019-01-14 MED ORDER — FENTANYL CITRATE (PF) 100 MCG/2ML IJ SOLN
INTRAMUSCULAR | Status: DC | PRN
  Administered 2019-01-14 (×4): 25 ug via INTRAVENOUS

## 2019-01-14 MED ORDER — SODIUM CHLORIDE 0.9 % IV SOLN
INTRAVENOUS | Status: DC | PRN
  Administered 2019-01-14: 17:00:00 100 ug/min via INTRAVENOUS

## 2019-01-14 MED ORDER — SUCCINYLCHOLINE CHLORIDE 200 MG/10ML IV SOSY
PREFILLED_SYRINGE | INTRAVENOUS | Status: AC
  Filled 2019-01-14: qty 10

## 2019-01-14 MED ORDER — PENTAFLUOROPROP-TETRAFLUOROETH EX AERO: 1.0000 "application " | INHALATION_SPRAY | CUTANEOUS | Status: DC | PRN

## 2019-01-14 MED ORDER — CEFAZOLIN SODIUM 1 G IJ SOLR
INTRAMUSCULAR | Status: AC
  Filled 2019-01-14: qty 20

## 2019-01-14 MED ORDER — PROPOFOL 10 MG/ML IV BOLUS
INTRAVENOUS | Status: DC | PRN
  Administered 2019-01-14: 20 mg via INTRAVENOUS
  Administered 2019-01-14: 50 mg via INTRAVENOUS

## 2019-01-14 MED ORDER — SODIUM CHLORIDE 0.9 % IV SOLN: 100.0000 mL | INTRAVENOUS | Status: DC | PRN

## 2019-01-14 MED ORDER — LIDOCAINE HCL (PF) 1 % IJ SOLN: 5.0000 mL | INTRAMUSCULAR | Status: DC | PRN

## 2019-01-14 MED ORDER — SODIUM CHLORIDE 0.9 % IV SOLN
INTRAVENOUS | Status: AC
  Filled 2019-01-14: qty 1.2

## 2019-01-14 MED ORDER — PHENYLEPHRINE 40 MCG/ML (10ML) SYRINGE FOR IV PUSH (FOR BLOOD PRESSURE SUPPORT)
PREFILLED_SYRINGE | INTRAVENOUS | Status: AC
  Filled 2019-01-14: qty 30

## 2019-01-14 MED ORDER — DOXERCALCIFEROL 4 MCG/2ML IV SOLN: 2.0000 ug | INTRAVENOUS | Status: DC

## 2019-01-14 MED ORDER — FENTANYL CITRATE (PF) 250 MCG/5ML IJ SOLN
INTRAMUSCULAR | Status: AC
  Filled 2019-01-14: qty 5

## 2019-01-14 MED ORDER — PHENYLEPHRINE HCL (PRESSORS) 10 MG/ML IV SOLN
INTRAVENOUS | Status: DC | PRN
  Administered 2019-01-14: 80 ug via INTRAVENOUS

## 2019-01-14 MED ORDER — HEPARIN SODIUM (PORCINE) 1000 UNIT/ML IJ SOLN
INTRAMUSCULAR | Status: DC | PRN
  Administered 2019-01-14: 5000 [IU] via INTRAVENOUS

## 2019-01-14 MED ORDER — PROTAMINE SULFATE 10 MG/ML IV SOLN
INTRAVENOUS | Status: AC
  Filled 2019-01-14: qty 5

## 2019-01-14 MED ORDER — CEFAZOLIN SODIUM-DEXTROSE 2-3 GM-%(50ML) IV SOLR
INTRAVENOUS | Status: DC | PRN
  Administered 2019-01-14: 2 g via INTRAVENOUS

## 2019-01-14 MED ORDER — 0.9 % SODIUM CHLORIDE (POUR BTL) OPTIME
TOPICAL | Status: DC | PRN
  Administered 2019-01-14 (×2): 1000 mL

## 2019-01-14 MED ORDER — HEPARIN SODIUM (PORCINE) 1000 UNIT/ML IJ SOLN
INTRAMUSCULAR | Status: AC
  Filled 2019-01-14: qty 1

## 2019-01-14 MED ORDER — LIDOCAINE HCL (PF) 1 % IJ SOLN
INTRAMUSCULAR | Status: AC
  Filled 2019-01-14: qty 30

## 2019-01-14 MED ORDER — SODIUM CHLORIDE (PF) 0.9 % IJ SOLN
INTRAMUSCULAR | Status: AC
  Filled 2019-01-14: qty 10

## 2019-01-14 MED ORDER — LIDOCAINE-PRILOCAINE 2.5-2.5 % EX CREA
1.0000 "application " | TOPICAL_CREAM | CUTANEOUS | Status: DC | PRN
  Filled 2019-01-14: qty 5

## 2019-01-14 MED ORDER — CHLORHEXIDINE GLUCONATE CLOTH 2 % EX PADS
6.0000 | MEDICATED_PAD | Freq: Every day | CUTANEOUS | Status: DC
  Administered 2019-01-15: 6 via TOPICAL

## 2019-01-14 MED ORDER — FENTANYL CITRATE (PF) 100 MCG/2ML IJ SOLN
INTRAMUSCULAR | Status: AC
  Filled 2019-01-14: qty 2

## 2019-01-14 MED ORDER — PROPOFOL 1000 MG/100ML IV EMUL
INTRAVENOUS | Status: AC
  Filled 2019-01-14: qty 100

## 2019-01-14 MED ORDER — PROTAMINE SULFATE 10 MG/ML IV SOLN
INTRAVENOUS | Status: DC | PRN
  Administered 2019-01-14: 20 mg via INTRAVENOUS

## 2019-01-14 MED ORDER — SODIUM CHLORIDE 0.9 % IV SOLN
INTRAVENOUS | Status: DC | PRN
  Administered 2019-01-14: 500 mL

## 2019-01-14 MED ORDER — GLYCOPYRROLATE PF 0.2 MG/ML IJ SOSY
PREFILLED_SYRINGE | INTRAMUSCULAR | Status: DC | PRN
  Administered 2019-01-14: .1 mg via INTRAVENOUS

## 2019-01-14 MED ORDER — LIDOCAINE 2% (20 MG/ML) 5 ML SYRINGE
INTRAMUSCULAR | Status: DC | PRN
  Administered 2019-01-14: 60 mg via INTRAVENOUS

## 2019-01-14 MED ORDER — GLYCOPYRROLATE PF 0.2 MG/ML IJ SOSY
PREFILLED_SYRINGE | INTRAMUSCULAR | Status: AC
  Filled 2019-01-14: qty 1

## 2019-01-14 MED ORDER — OXYCODONE-ACETAMINOPHEN 5-325 MG PO TABS
1.0000 | ORAL_TABLET | ORAL | Status: DC | PRN
  Administered 2019-01-14: 1 via ORAL
  Filled 2019-01-14: qty 1

## 2019-01-14 MED ORDER — PROPOFOL 500 MG/50ML IV EMUL
INTRAVENOUS | Status: DC | PRN
  Administered 2019-01-14: 75 ug/kg/min via INTRAVENOUS

## 2019-01-14 MED ORDER — LIDOCAINE-EPINEPHRINE (PF) 1 %-1:200000 IJ SOLN
INTRAMUSCULAR | Status: AC
  Filled 2019-01-14: qty 30

## 2019-01-14 MED ORDER — LIDOCAINE 2% (20 MG/ML) 5 ML SYRINGE
INTRAMUSCULAR | Status: AC
  Filled 2019-01-14: qty 5

## 2019-01-14 MED ORDER — LIDOCAINE 5 % EX PTCH
1.0000 | MEDICATED_PATCH | CUTANEOUS | Status: DC
  Administered 2019-01-14 – 2019-01-15 (×2): 1 via TRANSDERMAL
  Filled 2019-01-14 (×2): qty 1

## 2019-01-14 MED ORDER — PIPERACILLIN-TAZOBACTAM 3.375 G IVPB 30 MIN
3.3750 g | Freq: Once | INTRAVENOUS | Status: DC
  Filled 2019-01-14: qty 50

## 2019-01-14 MED ORDER — LIDOCAINE HCL 1 % IJ SOLN
INTRAMUSCULAR | Status: DC | PRN
  Administered 2019-01-14: 10 mL

## 2019-01-14 SURGICAL SUPPLY — 35 items
ARMBAND PINK RESTRICT EXTREMIT (MISCELLANEOUS) ×3 IMPLANT
CANISTER SUCT 3000ML PPV (MISCELLANEOUS) ×3 IMPLANT
COVER PROBE W GEL 5X96 (DRAPES) ×3 IMPLANT
COVER WAND RF STERILE (DRAPES) ×3 IMPLANT
DERMABOND ADVANCED (GAUZE/BANDAGES/DRESSINGS) ×2
DERMABOND ADVANCED .7 DNX12 (GAUZE/BANDAGES/DRESSINGS) ×1 IMPLANT
ELECT REM PT RETURN 9FT ADLT (ELECTROSURGICAL) ×3
ELECTRODE REM PT RTRN 9FT ADLT (ELECTROSURGICAL) ×1 IMPLANT
GAUZE SPONGE 4X4 12PLY STRL (GAUZE/BANDAGES/DRESSINGS) ×3 IMPLANT
GLOVE BIO SURGEON STRL SZ7.5 (GLOVE) ×3 IMPLANT
GLOVE BIOGEL PI IND STRL 7.5 (GLOVE) ×1 IMPLANT
GLOVE BIOGEL PI IND STRL 8 (GLOVE) ×1 IMPLANT
GLOVE BIOGEL PI INDICATOR 7.5 (GLOVE) ×2
GLOVE BIOGEL PI INDICATOR 8 (GLOVE) ×2
GLOVE ECLIPSE 7.0 STRL STRAW (GLOVE) ×3 IMPLANT
GOWN STRL REUS W/ TWL LRG LVL3 (GOWN DISPOSABLE) ×2 IMPLANT
GOWN STRL REUS W/ TWL XL LVL3 (GOWN DISPOSABLE) ×2 IMPLANT
GOWN STRL REUS W/TWL LRG LVL3 (GOWN DISPOSABLE) ×4
GOWN STRL REUS W/TWL XL LVL3 (GOWN DISPOSABLE) ×4
HEMOSTAT SPONGE AVITENE ULTRA (HEMOSTASIS) IMPLANT
KIT BASIN OR (CUSTOM PROCEDURE TRAY) ×3 IMPLANT
KIT TURNOVER KIT B (KITS) ×3 IMPLANT
NS IRRIG 1000ML POUR BTL (IV SOLUTION) ×3 IMPLANT
PACK CV ACCESS (CUSTOM PROCEDURE TRAY) ×3 IMPLANT
PAD ARMBOARD 7.5X6 YLW CONV (MISCELLANEOUS) ×6 IMPLANT
SUT ETHILON 3 0 PS 1 (SUTURE) IMPLANT
SUT MNCRL AB 4-0 PS2 18 (SUTURE) ×6 IMPLANT
SUT PROLENE 3 0 SH DA (SUTURE) ×6 IMPLANT
SUT PROLENE 6 0 BV (SUTURE) IMPLANT
SUT SILK 0 TIES 10X30 (SUTURE) ×3 IMPLANT
SUT VIC AB 3-0 SH 27 (SUTURE) ×2
SUT VIC AB 3-0 SH 27X BRD (SUTURE) ×1 IMPLANT
TOWEL GREEN STERILE (TOWEL DISPOSABLE) ×3 IMPLANT
UNDERPAD 30X30 (UNDERPADS AND DIAPERS) ×3 IMPLANT
WATER STERILE IRR 1000ML POUR (IV SOLUTION) ×3 IMPLANT

## 2019-01-14 NOTE — Consult Note (Signed)
Vascular and Vein Specialist of [facility]  Patient name: [name] MRN: [id number] DOB: [birth year] Sex: male   REQUESTING PROVIDER:    ER   REASON FOR CONSULT:    Fistula ulcer  HISTORY OF PRESENT ILLNESS:   [name] is a 68 y.o. male, who presented to [facility] long emergency department with a several month history of lesions on his left upper arm fistula.  He has now noted some drainage that may be purulent.  He has not had any bleeding.  He was scheduled to see vascular surgery this week however because of the fistula appearance he came to the emergency department.  He has not had any bleeding from the area he saw Dr. early in January for aneurysmal changes of his fistula but at that time he did not have any ulcers.  He denies having any fevers.  He has not had any swelling in his arm.  His fistula was placed at [facility].  His last dialysis was Friday. Marland Kitchen  He takes a statin for hypercholesterolemia.  He is on immunosuppression for his transplant.  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  He is on immunosuppression.  He suffers from diabetes.  He takes a statin for hypercholesterolemia.  He is medically managed for hypertension.  PAST MEDICAL HISTORY    Past Medical History:  Diagnosis Date  . Aortic valve disorders   . Benign neoplasm of colon   . Degeneration of intervertebral disc, site unspecified   . Diabetes mellitus without complication ([place])   . 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 Esophageal reflux   . ESRD (end stage renal disease) on dialysis ([place])   . Essential hypertension   . Obstructive sleep apnea (adult) (pediatric)   . Osteoarthrosis, unspecified whether generalized or localized, unspecified site   . Other and unspecified hyperlipidemia   . Pneumonia    interstitial pneumonia  . Pulmonary fibrosis ([place])   . Renal disorder   . Respiratory failure  with hypoxia ([place]) 12/2015  . Shortness of breath dyspnea   . Transplanted, lung ([place])   . Unspecified essential hypertension      FAMILY HISTORY   Family History  Problem Relation Age of Onset  . Pancreatic cancer Brother   . Heart disease Father     SOCIAL HISTORY:   Social History   Socioeconomic History  . Marital status: Married    Spouse name: Not on file  . Number of children: Not on file  . Years of education: Not on file  . Highest education level: Not on file  Occupational History  . Occupation: Systems analyst  Social Needs  . Financial resource strain: Not on file  . Food insecurity    Worry: Not on file    Inability: Not on file  . Transportation needs    Medical: Not on file    Non-medical: Not on file  Tobacco Use  . Smoking status: Former Smoker    Packs/day: 0.30    Years: 20.00    Pack years: 6.00    Types: Cigarettes    Quit date: 06/06/2002    Years since quitting: 16.6  . Smokeless tobacco: Never Used  Substance and Sexual Activity  . Alcohol use: No    Alcohol/week: 0.0 standard drinks  . Drug use: No  . Sexual activity: Not on file  Lifestyle  . Physical activity    Days per week: Not on file  Minutes per session: Not on file  . Stress: Not on file  Relationships  . Social Herbalist on phone: Not on file    Gets together: Not on file    Attends religious service: Not on file    Active member of club or organization: Not on file    Attends meetings of clubs or organizations: Not on file    Relationship status: Not on file  . Intimate partner violence    Fear of current or ex partner: Not on file    Emotionally abused: Not on file    Physically abused: Not on file    Forced sexual activity: Not on file  Other Topics Concern  . Not on file  Social History Narrative  . Not on file    ALLERGIES:    Allergies  Allergen Reactions  . Levofloxacin Other (See Comments)    LOSS OF CONSCIOUSNESS  . Nsaids Other (See Comments)     Patient instructed not to take NSAID's after his lung transplant    CURRENT MEDICATIONS:    Current Facility-Administered Medications  Medication Dose Route Frequency Provider Last Rate Last Dose  . 0.9 %  sodium chloride infusion  250 mL Intravenous PRN Oswald Hillock, MD      . acetaminophen (TYLENOL) tablet 500-1,000 mg  500-1,000 mg Oral Q6H PRN Oswald Hillock, MD      . aspirin chewable tablet 81 mg  81 mg Oral Daily Darrick Meigs, Marge Duncans, MD      . azaTHIOprine (IMURAN) tablet 25 mg  25 mg Oral Q M,W,F-1800 Darrick Meigs, Marge Duncans, MD      . calcium acetate (PHOSLO) capsule 1,334 mg  1,334 mg Oral TID WC Oswald Hillock, MD      . cycloSPORINE (SANDIMMUNE) capsule 100 mg  100 mg Oral q morning - 10a Zada Finders R, MD       And  . cycloSPORINE (SANDIMMUNE) capsule 75 mg  75 mg Oral QHS Zada Finders R, MD   75 mg at 01/13/19 2309  . dorzolamide (TRUSOPT) 2 % ophthalmic solution 1 drop  1 drop Both Eyes BID Oswald Hillock, MD   1 drop at 01/13/19 2316  . heparin injection 5,000 Units  5,000 Units Subcutaneous Q8H Oswald Hillock, MD   5,000 Units at 01/13/19 2313  . insulin aspart (novoLOG) injection 0-9 Units  0-9 Units Subcutaneous TID WC Darrick Meigs, Marge Duncans, MD      . ondansetron (ZOFRAN) tablet 4 mg  4 mg Oral Q6H PRN Oswald Hillock, MD       Or  . ondansetron (ZOFRAN) injection 4 mg  4 mg Intravenous Q6H PRN Darrick Meigs, Marge Duncans, MD      . pantoprazole (PROTONIX) EC tablet 40 mg  40 mg Oral Daily [place], Marge Duncans, MD      . polyvinyl alcohol (LIQUIFILM TEARS) 1.4 % ophthalmic solution 1-2 drop  1-2 drop Both Eyes Daily PRN Oswald Hillock, MD      . pravastatin (PRAVACHOL) tablet 20 mg  20 mg Oral QHS Oswald Hillock, MD   20 mg at 01/13/19 2313  . prednisoLONE acetate (PRED FORTE) 1 % ophthalmic suspension 1 drop  1 drop Both Eyes BID Oswald Hillock, MD   1 drop at 01/13/19 2311  . sodium chloride flush (NS) 0.9 % injection 3 mL  3 mL Intravenous Q12H Darrick Meigs, Marge Duncans, MD      . sodium chloride flush (NS)  0.9 % injection 3  mL  3 mL Intravenous PRN Darrick Meigs, Marge Duncans, MD      . sulfamethoxazole-trimethoprim (BACTRIM) 400-80 MG per tablet 1 tablet  1 tablet Oral Q M,W,F-1800 Darrick Meigs, Marge Duncans, MD      . valGANciclovir (VALCYTE) 450 MG tablet TABS 450 mg  450 mg Oral Once per day on Mon Fri Lama, Gagan S, MD      . vancomycin variable dose per unstable renal function (pharmacist dosing)   Does not apply See admin instructions Angela Adam, [facility]        REVIEW OF SYSTEMS:   [X]  denotes positive finding, [ ]  denotes negative finding Cardiac  Comments:  Chest pain or chest pressure:    Shortness of breath upon exertion:    Short of breath when lying flat:    Irregular heart rhythm:        Vascular    Pain in calf, thigh, or hip brought on by ambulation:    Pain in feet at night that wakes you up from your sleep:     Blood clot in your veins:    Leg swelling:         Pulmonary    Oxygen at home:    Productive cough:     Wheezing:         Neurologic    Sudden weakness in arms or legs:     Sudden numbness in arms or legs:     Sudden onset of difficulty speaking or slurred speech:    Temporary loss of vision in one eye:     Problems with dizziness:         Gastrointestinal    Blood in stool:      Vomited blood:         Genitourinary    Burning when urinating:     Blood in urine:        Psychiatric    Major depression:         Hematologic    Bleeding problems:    Problems with blood clotting too easily:        Skin    Rashes or ulcers: x       Constitutional    Fever or chills:     PHYSICAL EXAM:   Vitals:   01/13/19 1517 01/13/19 1610 01/13/19 1928 01/13/19 2106  BP: (!) 148/74  (!) 145/70 (!) 152/75  Pulse: 81  75 75  Resp: 18  20 18   Temp:    97.9 F (36.6 C)  TempSrc:    Oral  SpO2: 98%  97% 97%  Weight:  71.7 kg    Height:  5\' 3"  (1.6 m)      GENERAL: The patient is a well-nourished male, in no acute distress. The vital signs are documented above. CARDIAC: There is a regular  rate and rhythm.  VASCULAR: Palpable thrill within his fistula.  There are 2 aneurysmal areas the one closest to the antecubital crease has a large eschar.  See photo below.  No swelling in the arm PULMONARY: Nonlabored respirations ABDOMEN: Soft and non-tender MUSCULOSKELETAL: There are no major deformities or cyanosis. NEUROLOGIC: No focal weakness or paresthesias are detected. SKIN: See photo below PSYCHIATRIC: The patient has a normal affect.    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  I  have recommended repair of the aneurysm as well as the overlying skin defect.  We discussed that he may require a catheter if he cannot be dialyzed after his surgery.  He is also at risk for losing his fistula.  He will be n.p.o. after midnight with plans for surgery tomorrow.  I would place him on antibiotics empirically given his immunocompromise state and since he states that there has been some drainage from this area that potentially could have been purulent.   Leia Alf, MD, FACS Vascular and Vein Specialists of [facility] [phone] Pager [phone]

## 2019-01-14 NOTE — Progress Notes (Signed)
Vascular and Vein Specialists of [place]  Subjective  - Ulcer left arm AVF   Objective 129/61 70 97.7 F (36.5 C) (Oral) 18 97%  Intake/Output Summary (Last 24 hours) at 01/14/2019 1453 Last data filed at 01/14/2019 1254 Gross per 24 hour  Intake 0 ml  Output -  Net 0 ml    Left arm brachiocephalic AVF - ulcerated segment  Laboratory Lab Results: Recent Labs    01/13/19 1603 01/14/19 0330  WBC 6.7 5.0  HGB 11.9* 10.6*  HCT 36.6* 32.3*  PLT 179 161   BMET Recent Labs    01/13/19 1603 01/14/19 0330  NA 137 139  K 4.2 4.1  CL 98 101  CO2 24 23  GLUCOSE 200* 96  BUN 56* 56*  CREATININE 9.49* 10.62*  CALCIUM 9.5 8.8*    COAG Lab Results  Component Value Date   INR 1.0 01/14/2019   INR 1.00 03/21/2016   INR 1.04 12/30/2015   No results found for: PTT  Assessment/Planning:  Plan for left arm AVF revision with plication of ulcerated segment.  Will try and avoid catheter if possible.  Marty Heck 01/14/2019 2:53 PM --

## 2019-01-14 NOTE — Progress Notes (Signed)
PROGRESS NOTE    SAJAN CHEATWOOD  VOH:[id number] DOB: 24-Jul-1950 DOA: 01/13/2019 PCP: Wenda Low, MD    Brief Narrative:   Dhruv Christina  is a 68 y.o. 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  Patient states that he noted some drainage.  He is followed by vascular surgery as outpatient.  Denies any fever or chills. His last dialysis was on Friday.  Denies nausea vomiting or diarrhea, no abdominal pain, no chest pain no shortness of breath. Vascular surgery was consulted by ED physician and recommended to transfer patient to [facility] for possible surgery in a.m.  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  Patient is afebrile without leukocytosis; although in the setting of immunosuppression secondary to history of lung transplant. --Vascular surgery following, appreciate assistance --Plan revision of AV fistula today in OR --Started on vancomycin; pharmacy for dosing/monitoring --Will start Zosyn for broad coverage given his immunocompromise state  History of lung transplant Underwent lung transplantation 2016 at [facility] for ILD.  Follows with lung transplant specialist, Whitman Hero. --Continue azathioprine, cyclosporine, Valcyte and cyclosporine  ESRD on HD Monday/Wednesday/Friday Patient reports full HD session on Friday. --Vascular surgery planning on revision of AV fistula secondary to infection/expansion --May need temporary HD catheter placed --Continue inpatient HD per nephrology   DVT prophylaxis: Heparin  Code Status: Full code Family Communication: None Disposition Plan: Continue inpatient hospitalization, IV antibiotics, Plan to OR this morning for AV fistula revision, further depending on clinical course   Consultants:   Vascular surgery - Dr Trula Slade and Dr. Carlis Abbott  Nephrology - Dr. Johnney Ou  Procedures:   Planned AV fistula revision on 01/14/2019  Antimicrobials:   Vancomycin 8/9>>  Zosyn 8/10   Subjective: Patient seen and examined at bedside, resting comfortably.  Awaiting transport to OR for surgical revision of his AV fistula.  Complains of some mild tenderness and purulent drainage surrounding his AV fistula site.  No other complaints or concerns at this time.  Denies headache, no fever/chills/night sweats, no nausea vomiting/diarrhea, no chest pain, no palpitations, no shortness of breath, no abdominal pain, no weakness, no paresthesias.  Objective: Vitals:   01/13/19 1610 01/13/19 1928 01/13/19 2106 01/14/19 0555  BP:  (!) 145/70 (!) 152/75 115/60  Pulse:  75 75 72  Resp:  20 18 17   Temp:   97.9 F (36.6 C) 98 F (36.7 C)  TempSrc:   Oral Oral  SpO2:  97% 97% 94%  Weight: 71.7 kg     Height: 5\' 3"  (1.6 m)       Intake/Output Summary (Last 24 hours) at 01/14/2019 1243 Last data filed at 01/14/2019 0858 Gross per 24 hour  Intake 0 ml  Output -  Net 0 ml   Filed Weights   01/13/19 1610  Weight: 71.7 kg    Examination:  General exam: Appears calm and comfortable  Respiratory system: Clear to auscultation. Respiratory effort normal. Cardiovascular system: S1 & S2 heard, RRR. No JVD, murmurs, rubs, gallops or clicks. No pedal edema. Gastrointestinal system: Abdomen is nondistended, soft and nontender.  No organomegaly or masses felt. Normal bowel sounds heard. Central nervous system: Alert and oriented. No focal neurological deficits. Extremities: Symmetric 5 x 5 power. Skin: Left upper extremity AV fistula site noted aneurysmal with ulceration and purulent  drainage Psychiatry: Judgement and insight appear normal. Mood & affect appropriate.       Data Reviewed: I have personally reviewed following labs and imaging studies  CBC: Recent Labs  Lab 01/13/19 1603 01/14/19 0330  WBC 6.7 5.0  NEUTROABS 5.7  --   HGB 11.9* 10.6*  HCT 36.6* 32.3*  MCV 108.6* 104.5*  PLT 179 563   Basic Metabolic Panel: Recent Labs  Lab 01/13/19 1603 01/14/19 0330  NA 137 139  K 4.2 4.1  CL 98 101  CO2 24 23  GLUCOSE 200* 96  BUN 56* 56*  CREATININE 9.49* 10.62*  CALCIUM 9.5 8.8*   GFR: Estimated Creatinine Clearance: 5.8 mL/min (A) (by C-G formula based on SCr of 10.62 mg/dL (H)). Liver Function Tests: Recent Labs  Lab 01/13/19 1603  AST 35  ALT 39  ALKPHOS 199*  BILITOT 1.0  PROT 7.6  ALBUMIN 3.9   No results for input(s): LIPASE, AMYLASE in the last 168 hours. No results for input(s): AMMONIA in the last 168 hours. Coagulation Profile: Recent Labs  Lab 01/14/19 0330  INR 1.0   Cardiac Enzymes: No results for input(s): CKTOTAL, CKMB, CKMBINDEX, TROPONINI in the last 168 hours. BNP (last 3 results) No results for input(s): PROBNP in the last 8760 hours. HbA1C: Recent Labs    01/14/19 0330  HGBA1C 7.3*   CBG: Recent Labs  Lab 01/13/19 2241 01/14/19 0751 01/14/19 1133  GLUCAP 173* 94 93   Lipid Profile: No results for input(s): CHOL, HDL, LDLCALC, TRIG, CHOLHDL, LDLDIRECT in the last 72 hours. Thyroid Function Tests: No results for input(s): TSH, T4TOTAL, FREET4, T3FREE, THYROIDAB in the last 72 hours. Anemia Panel: No results for input(s): VITAMINB12, FOLATE, FERRITIN, TIBC, IRON, RETICCTPCT in the last 72 hours. Sepsis Labs: No results for input(s): PROCALCITON, LATICACIDVEN in the last 168 hours.  Recent Results (from the past 240 hour(s))  SARS Coronavirus 2 [facility] order, Performed in [facility] hospital lab) Nasopharyngeal Nasopharyngeal Swab     Status: None   Collection Time: 01/13/19  4:52 PM    Specimen: Nasopharyngeal Swab  Result Value Ref Range Status   SARS Coronavirus 2 NEGATIVE NEGATIVE Final    Comment: (NOTE) If result is NEGATIVE SARS-CoV-2 target nucleic acids are NOT DETECTED. The SARS-CoV-2 RNA is generally detectable in upper and lower  respiratory specimens during the acute phase of infection. The lowest  concentration of SARS-CoV-2 viral copies this assay can detect is 250  copies / mL. A negative result does not preclude SARS-CoV-2 infection  and should not be used as the sole basis for treatment or other  patient management decisions.  A negative result may occur with  improper specimen collection / handling, submission of specimen other  than nasopharyngeal swab, presence of viral mutation(s) within the  areas targeted by this assay, and inadequate number of viral copies  (<250 copies / mL). A negative result must be combined with clinical  observations, patient history, and epidemiological information. If result is POSITIVE SARS-CoV-2 target nucleic acids are DETECTED. The SARS-CoV-2 RNA is generally detectable in upper and lower  respiratory specimens dur ing the acute phase of infection.  Positive  results are indicative of active infection with SARS-CoV-2.  Clinical  correlation with patient history and other diagnostic  information is  necessary to determine patient infection status.  Positive results do  not rule out bacterial infection or co-infection with other viruses. If result is PRESUMPTIVE POSTIVE SARS-CoV-2 nucleic acids MAY BE PRESENT.   A presumptive positive result was obtained on the submitted specimen  and confirmed on repeat testing.  While 2019 novel coronavirus  (SARS-CoV-2) nucleic acids may be present in the submitted sample  additional confirmatory testing may be necessary for epidemiological  and / or clinical management purposes  to differentiate between  SARS-CoV-2 and other Sarbecovirus currently known to infect humans.  If  clinically indicated additional testing with an alternate test  methodology [phone]) is advised. The SARS-CoV-2 RNA is generally  detectable in upper and lower respiratory sp ecimens during the acute  phase of infection. The expected result is Negative. Fact Sheet for Patients:  StrictlyIdeas.no Fact Sheet for Healthcare Providers: BankingDealers.co.za This test is not yet approved or cleared by the [place] FDA and has been authorized for detection and/or diagnosis of SARS-CoV-2 by FDA under an Emergency Use Authorization (EUA).  This EUA will remain in effect (meaning this test can be used) for the duration of the COVID-19 declaration under Section 564(b)(1) of the Act, 21 U.S.C. section 360bbb-3(b)(1), unless the authorization is terminated or revoked sooner. Performed at [facility], [place] [address] [place], [place] [address]   Surgical pcr screen     Status: None   Collection Time: 01/14/19  2:47 AM   Specimen: Nasal Mucosa; Nasal Swab  Result Value Ref Range Status   MRSA, PCR NEGATIVE NEGATIVE Final   Staphylococcus aureus NEGATIVE NEGATIVE Final    Comment: (NOTE) The Xpert SA Assay (FDA approved for NASAL specimens in patients 30 years of age and older), is one component of a comprehensive surveillance program. It is not intended to diagnose infection nor to guide or monitor treatment. Performed at [facility], [place] [address] [place], [place] [address]          Radiology Studies: No results found.      Scheduled Meds: . aspirin  81 mg Oral Daily  . azaTHIOprine  25 mg Oral Q M,W,F-1800  . calcium acetate  1,334 mg Oral TID WC  . cycloSPORINE  100 mg Oral q morning - 10a   And  . cycloSPORINE  75 mg Oral QHS  . dorzolamide  1 drop Both Eyes BID  . heparin  5,000 Units Subcutaneous Q8H  . insulin aspart  0-9 Units Subcutaneous TID WC  . lidocaine  1 patch Transdermal Q24H  .  pantoprazole  40 mg Oral Daily  . pravastatin  20 mg Oral QHS  . prednisoLONE acetate  1 drop Both Eyes BID  . sodium chloride flush  3 mL Intravenous Q12H  . sulfamethoxazole-trimethoprim  1 tablet Oral Q M,W,F-1800  . valGANciclovir  450 mg Oral Once per day on Mon Fri  . vancomycin variable dose per unstable renal function (pharmacist dosing)   Does not apply See admin instructions   Continuous Infusions: . sodium chloride       LOS: 1 day    Time spent: 36 minutes spent on chart review, personally reviewed all imaging studies and labs, discussion with nursing staff, consultants, updating family and interview/physical exam; more than 50% of that time was spent in counseling and/or coordination of care.    Jeramia Saleeby J [place] DO [facility] Pager [phone]  If 7PM-7AM, please contact night-coverage [email or web address] Password [facility] 01/14/2019, 12:43 PM

## 2019-01-14 NOTE — Anesthesia Preprocedure Evaluation (Signed)
Anesthesia Evaluation  Patient identified by MRN, date of birth, ID band Patient awake    Reviewed: Allergy & Precautions, NPO status , Patient's Chart, lab work & pertinent test results  Airway Mallampati: III  TM Distance: >3 FB Neck ROM: Full    Dental  (+) Teeth Intact, Dental Advisory Given   Pulmonary former smoker,    breath sounds clear to auscultation       Cardiovascular hypertension,  Rhythm:Regular Rate:Normal     Neuro/Psych    GI/Hepatic   Endo/Other  diabetes  Renal/GU      Musculoskeletal   Abdominal (+) + obese,   Peds  Hematology   Anesthesia Other Findings   Reproductive/Obstetrics                             Anesthesia Physical Anesthesia Plan  ASA: III  Anesthesia Plan: MAC   Post-op Pain Management:    Induction: Intravenous  PONV Risk Score and Plan: Ondansetron and Dexamethasone  Airway Management Planned: Simple Face Mask and Natural Airway  Additional Equipment:   Intra-op Plan:   Post-operative Plan:   Informed Consent: I have reviewed the patients History and Physical, chart, labs and discussed the procedure including the risks, benefits and alternatives for the proposed anesthesia with the patient or authorized representative who has indicated his/her understanding and acceptance.     Dental advisory given  Plan Discussed with: CRNA and Anesthesiologist  Anesthesia Plan Comments:         Anesthesia Quick Evaluation

## 2019-01-14 NOTE — Op Note (Signed)
Date: January 14, 2019  Preoperative diagnosis: Ulcerated left arm brachiocephalic AV fistula  Postoperative diagnosis: Same  Procedure: Left upper extremity AV fistula revision with aneurysm plication  Surgeon: Dr. Marty Heck, MD  Assistant: Roxy Horseman, PA  Indication: Patient is 68 year old male who has been dialyzing via left arm brachiocephalic AV fistula.  He was seen in consultation yesterday by Dr. Trula Slade with ulcerated segment over the fistula that had been bleeding.  He presents today for fistula revision with plication after risk and benefits were discussed.  Findings: Left upper arm tourniquet was used and the ulcerated segment was then ellipsed and the underlying brachiocephalic AV fistula was plicated.  There should be room to stick above and below the fistula.  No catheter was placed.  He had a great thrill in the fistula upon completion.  Anesthesia: MAC  Details: Patient was taken to the operating room after informed consent was obtained.  Placed on operative table in the supine position.  His left arm was prepped draped usual sterile fashion.  Initially placed a tourniquet on his upper arm after 5000 units of IV heparin was circulated for 2 minutes.  We used an Esmarch and then exsanguinated his left upper arm and then went up on the tourniquet at 250 mmHg.  I then made an elliptical incision over the fistula incorporating the ulcerated skin over his fistula.  Dissected down with Bovie cautery and circumferentially mobilized the underlying brachiocephalic AV fistula.  This was controlled proximally distally with Vesseloops.  At that point in time I then came off the tourniquet which was very brief.  I then used fistula clamps proximally and distally.  Using Metzenbaum scissors the ulcerated skin and underlying aneurysmal fistula was then resected anteriorly.  I then used a running 5-0 Prolene to plicate the fistula back together.  There was an excellent thrill  in the fistula.  Everything was washed out until the saline was clear.  I then raised a couple of small skin flaps with Bovie cautery.  Subcutaneous tissue was closed with 3-0 Vicryl.  Skin closed with 4-0 Monocryl.  Dermabond applied  Condition: Stable  Complication: None  Marty Heck, MD [facility] Office: [phone] Pager: [place]

## 2019-01-14 NOTE — Progress Notes (Signed)
Pharmacy Antibiotic Note  [name] is a 68 y.o. male admitted on 01/13/2019 with infected AV fistula.  Pharmacy has been consulted for Vancomycin and Zosyn dosing. Vanc begun 8/9 pm, Zosyn added today due to immunocompromised. Hx lung transplant, on Imuran and Cyclosporine. AV revision schedule for today.  ESRD, usual HD MWF, planning HD today after surgery.  Plan:  Zosyn 3.375 gm IV x 1 over 30 minutes; expecting procedure soon.  Then Zosyn 3.375 gm IV q12h (each over 4 hours)  Vancomycin 750 mg IV after HD on MWF.  Follow HD schedule for any need to adjust timing of antibiotics.  Follow for any cultures.  Height: 5\' 3"  (160 cm) Weight: 157 lb 9.6 oz (71.5 kg) IBW/kg (Calculated) : 56.9  Temp (24hrs), Avg:97.9 F (36.6 C), Min:97.7 F (36.5 C), Max:98 F (36.7 C)  Recent Labs  Lab 01/13/19 1603 01/14/19 0330  WBC 6.7 5.0  CREATININE 9.49* 10.62*    Estimated Creatinine Clearance: 5.8 mL/min (A) (by C-G formula based on SCr of 10.62 mg/dL (H)).    Allergies  Allergen Reactions  . Levofloxacin Other (See Comments)    LOSS OF CONSCIOUSNESS  . Nsaids Other (See Comments)    Patient instructed not to take NSAID's after his lung transplant    Antimicrobials this admission:  Vancomycin 8/9>>  Zosyn 8/10 >>  Septra MWF as PTA  Valtrex Mon and Fri as PTA  Dose adjustments this admission:  n/a  Microbiology results:  8/10 MRSA PCR: negative  8/10 COVID: negative  Thank you for allowing pharmacy to be a part of this patient's care.  Arty Baumgartner, [place] Pager: [phone] or phone: [phone] 01/14/2019 1:29 PM

## 2019-01-14 NOTE — Consult Note (Addendum)
[facility] Renal Consultation Note    Indication for Consultation:  Management of ESRD/hemodialysis; anemia, hypertension/volume and secondary hyperparathyroidism PCP: Dr. Mechele Dawley  Dr. Sharyne Richters Nephrology Dr. Angelina Sheriff pulmonary transplant  HPI: [name] is a 68 y.o. male with ESRD on hemodialysis MWF at [facility]. PMH: orthotopic single lung transplant for ILD 2016 with AKI post procedure, did not regain renal function. DMT2, OSA, HTN, AOCD, SHPT. 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  Patient in compliant with HD prescription. He is in process of being evaluated for renal transplant at [facility]. He continues to follow with [facility] transplant S/P lung transplant.   Patient states that he'd had swelling in AVF prior to admission as noted above. He says he was wearing short sleeve shirt 01/13/2019 and son's girlfriend who is RN noticed pus draining from ulcerated area on AVF. He presented to ED 01/13/2019 with C/O swelling, erythema and drainage L AVF. He denies fever, chills, says he has had mild SOB/DOE but believes it's related to ILD. Labs unremarkable on admission other than elevated BS 200. He has been started on Vanc per primary. [facility] has been consulted. He is scheduled to go to OR for revision of AVF, possible TDC placement today.    Past Medical History:  Diagnosis Date  . Aortic valve disorders   . Benign neoplasm of colon   . Degeneration of intervertebral disc, site unspecified   . Diabetes mellitus without complication ([place])   . 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 Esophageal reflux   . ESRD (end stage renal disease) on dialysis ([place])   . Essential hypertension   . Obstructive sleep apnea (adult) (pediatric)   . Osteoarthrosis, unspecified whether generalized or localized, unspecified site   . Other and unspecified  hyperlipidemia   . Pneumonia    interstitial pneumonia  . Pulmonary fibrosis ([place])   . Renal disorder   . Respiratory failure with hypoxia ([place]) 12/2015  . Shortness of breath dyspnea   . Transplanted, lung ([place])   . Unspecified essential hypertension    Past Surgical History:  Procedure Laterality Date  . COLONOSCOPY WITH PROPOFOL N/A 11/07/2017   Procedure: COLONOSCOPY WITH PROPOFOL;  Surgeon: Ronnette Juniper, MD;  Location: [facility] ENDOSCOPY;  Service: Gastroenterology;  Laterality: N/A;  . LUNG BIOPSY  2010  . LUNG TRANSPLANT, SINGLE Right   . POLYPECTOMY  11/07/2017   Procedure: POLYPECTOMY;  Surgeon: Ronnette Juniper, MD;  Location: [facility];  Service: Gastroenterology;;   Family History  Problem Relation Age of Onset  . Pancreatic cancer Brother   . Heart disease Father    Social History:  reports that he quit smoking about 16 years ago. His smoking use included cigarettes. He has a 6.00 pack-year smoking history. He has never used smokeless tobacco. He reports that he does not drink alcohol or use drugs. Allergies  Allergen Reactions  . Levofloxacin Other (See Comments)    LOSS OF CONSCIOUSNESS  . Nsaids Other (See Comments)    Patient instructed not to take NSAID's after his lung transplant   Prior to Admission medications   Medication Sig Start Date End Date Taking? Authorizing Provider  acetaminophen (TYLENOL) 500 MG tablet Take 500-1,000 mg by mouth every 6 (six) hours as needed for headache.   Yes [provider]  aspirin 81 MG tablet Take 81 mg by mouth daily.    Yes  [provider]  azaTHIOprine (IMURAN) 50 MG tablet Take 25 mg by mouth See admin instructions. Take 25mg  by mouth on Monday, Wednesday, and Friday after dialysis   Yes [provider]  calcium acetate (PHOSLO) 667 MG capsule Take 1,334 mg by mouth 3 (three) times daily before meals.    Yes [provider]  cycloSPORINE (SANDIMMUNE) 25 MG capsule Take 25 mg by mouth See admin  instructions. Take 100mg  by mouth in the morning and 75mg  by mouth at night   Yes [provider]  dorzolamide (TRUSOPT) 2 % ophthalmic solution Place 1 drop into both eyes 2 (two) times daily. 03/31/18  Yes [provider]  fluticasone (FLONASE) 50 MCG/ACT nasal spray Place 2 sprays into the nose daily. Patient taking differently: Place 2 sprays into the nose daily as needed for allergies.  03/05/13  Yes Wenda Low, MD  Immune Globulin 10% (PRIVIGEN) 20 GM/200ML SOLN Inject 0.5 g/kg into the vein every 3 (three) months.    Yes [provider]  Multiple Vitamin (MULTIVITAMIN) capsule Take 1 capsule by mouth daily.    Yes [provider]  omeprazole (PRILOSEC) 40 MG capsule Take 40 mg by mouth 3 (three) times daily with meals.  11/12/18  Yes [provider]  polyvinyl alcohol-povidone (REFRESH) 1.4-0.6 % ophthalmic solution Place 1-2 drops into both eyes daily as needed (for dryness).   Yes [provider]  pravastatin (PRAVACHOL) 20 MG tablet Take 20 mg by mouth at bedtime.    Yes [provider]  prednisoLONE acetate (PRED FORTE) 1 % ophthalmic suspension Place 1 drop into both eyes 2 (two) times daily.  04/02/18  Yes [provider]  sertraline (ZOLOFT) 50 MG tablet Take 50 mg by mouth daily as needed (depression).    Yes [provider]  sulfamethoxazole-trimethoprim (BACTRIM,SEPTRA) 400-80 MG tablet Take 1 tablet by mouth 3 (three) times a week. Monday, Wednesday, and Friday after dialysis 02/27/17  Yes [provider]  valGANciclovir (VALCYTE) 450 MG tablet Take 1 tablet by mouth See admin instructions. Every Monday and Friday AFTER DIALYSIS 03/26/15  Yes [provider]  [place] As directed up to 4 times daily 12/16/15   [provider]  insulin regular (NOVOLIN R) 100 units/mL injection Inject 5 Units into the skin 3 (three) times daily before meals. Additional units  (per sliding scale): BGL 201-250 = 1 unit; 251-300 = 2 units; 301-350 = 3 units; 351-400 = units; >401 = 5 units + CALL LUNG COORDINATOR @ [facility]    [provider]   Current Facility-Administered Medications  Medication Dose Route Frequency Provider Last Rate Last Dose  . 0.9 %  sodium chloride infusion  250 mL Intravenous PRN Oswald Hillock, MD      . acetaminophen (TYLENOL) tablet 500-1,000 mg  500-1,000 mg Oral Q6H PRN Oswald Hillock, MD      . aspirin chewable tablet 81 mg  81 mg Oral Daily Darrick Meigs, Marge Duncans, MD      . azaTHIOprine (IMURAN) tablet 25 mg  25 mg Oral Q M,W,F-1800 Darrick Meigs, Marge Duncans, MD      . calcium acetate (PHOSLO) capsule 1,334 mg  1,334 mg Oral TID WC Oswald Hillock, MD      . cycloSPORINE (SANDIMMUNE) capsule 100 mg  100 mg Oral q morning - 10a Lenore Cordia, MD       And  . cycloSPORINE (SANDIMMUNE) capsule 75 mg  75 mg Oral QHS Posey Pronto,  Vishal R, MD   75 mg at 01/13/19 2309  . dorzolamide (TRUSOPT) 2 % ophthalmic solution 1 drop  1 drop Both Eyes BID Oswald Hillock, MD   1 drop at 01/14/19 0946  . heparin injection 5,000 Units  5,000 Units Subcutaneous Q8H Oswald Hillock, MD   5,000 Units at 01/13/19 2313  . insulin aspart (novoLOG) injection 0-9 Units  0-9 Units Subcutaneous TID WC Darrick Meigs, Marge Duncans, MD      . lidocaine (LIDODERM) 5 % 1 patch  1 patch Transdermal Q24H [place] Eric J, DO   1 patch at 01/14/19 0946  . ondansetron (ZOFRAN) tablet 4 mg  4 mg Oral Q6H PRN Oswald Hillock, MD       Or  . ondansetron (ZOFRAN) injection 4 mg  4 mg Intravenous Q6H PRN Darrick Meigs, Marge Duncans, MD      . pantoprazole (PROTONIX) EC tablet 40 mg  40 mg Oral Daily [place], Marge Duncans, MD      . polyvinyl alcohol (LIQUIFILM TEARS) 1.4 % ophthalmic solution 1-2 drop  1-2 drop Both Eyes Daily PRN Oswald Hillock, MD      . pravastatin (PRAVACHOL) tablet 20 mg  20 mg Oral QHS Oswald Hillock, MD   20 mg at 01/13/19 2313  . prednisoLONE acetate (PRED FORTE) 1 % ophthalmic suspension 1 drop  1 drop Both Eyes BID Oswald Hillock, MD   1 drop at 01/14/19 0946  . sodium chloride flush (NS) 0.9 % injection 3 mL  3 mL Intravenous Q12H Oswald Hillock, MD   3 mL at 01/14/19 0949  . sodium chloride flush (NS) 0.9 % injection 3 mL  3 mL Intravenous PRN Darrick Meigs, Marge Duncans, MD      . sulfamethoxazole-trimethoprim (BACTRIM) 400-80 MG per tablet 1 tablet  1 tablet Oral Q M,W,F-1800 Darrick Meigs, Marge Duncans, MD      . valGANciclovir (VALCYTE) 450 MG tablet TABS 450 mg  450 mg Oral Once per day on Mon Fri Lama, Gagan S, MD      . vancomycin variable dose per unstable renal function (pharmacist dosing)   Does not apply See admin instructions [facility]       Labs: Basic Metabolic Panel: Recent Labs  Lab 01/13/19 1603 01/14/19 0330  NA 137 139  K 4.2 4.1  CL 98 101  CO2 24 23  GLUCOSE 200* 96  BUN 56* 56*  CREATININE 9.49* 10.62*  CALCIUM 9.5 8.8*   Liver Function Tests: Recent Labs  Lab 01/13/19 1603  AST 35  ALT 39  ALKPHOS 199*  BILITOT 1.0  PROT 7.6  ALBUMIN 3.9   No results for input(s): LIPASE, AMYLASE in the last 168 hours. No results for input(s): AMMONIA in the last 168 hours. CBC: Recent Labs  Lab 01/13/19 1603 01/14/19 0330  WBC 6.7 5.0  NEUTROABS 5.7  --   HGB 11.9* 10.6*  HCT 36.6* 32.3*  MCV 108.6* 104.5*  PLT 179 161   Cardiac Enzymes: No results for input(s): CKTOTAL, CKMB, CKMBINDEX, TROPONINI in the last 168 hours. CBG: Recent Labs  Lab 01/13/19 2241 01/14/19 0751 01/14/19 1133  GLUCAP 173* 94 93   Iron Studies: No results for input(s): IRON, TIBC, TRANSFERRIN, FERRITIN in the last 72 hours. Studies/Results: No results found.  ROS: As per HPI otherwise negative.  Physical Exam: Vitals:   01/13/19 1610 01/13/19 1928 01/13/19 2106 01/14/19 0555  BP:  (!) 145/70 (!) 152/75 115/60  Pulse:  75 75 72  Resp:  20 18 17   Temp:   97.9 F (36.6 C) 98 F (36.7 C)  TempSrc:   Oral Oral  SpO2:  97% 97% 94%  Weight: 71.7 kg     Height: 5\' 3"  (1.6 m)        General:  Chronically ill appearing male in no acute distress. Head: Normocephalic, atraumatic, sclera non-icteric, mucus membranes are moist. Periorbital edema present L eye > R eye.  Neck: Supple. JVD not elevated. Lungs: Clear bilaterally to auscultation without wheezes, rales, or rhonchi. Breathing is unlabored. Heart: RRR with S1 S2. No murmurs, rubs, or gallops appreciated. Abdomen: Soft, non-tender, non-distended with normoactive bowel sounds. No rebound/guarding. No obvious abdominal masses. M-S:  Strength and tone appear normal for age. Lower extremities: Trace BLE edema.  Neuro: Alert and oriented X 3. Moves all extremities spontaneously. Psych:  Responds to questions appropriately with a normal affect. Dialysis Access: L AVF with ulcerated area approximately 2.5 cm diameter. AVF is pulsatile, + bruit. No drainage at present. Mild swelling present.   Dialysis Orders: [place] MWF 4 hrs 160NRe 400/800 67.5 kg 2.0 K/ 2.25Ca UFP 4  -Heparin 1800 units IV TIW -Hectorol 2 mcg IV TIW (Last PTH 227 Phos 4.6 Ca 8.2 C Ca 8.6 01/09/19)  Assessment/Plan: 1.  Infected, aneurysmal AVF-Started on Vanc per primary. WBC 6.7 on adm. No Cx pending. Going for revision, possible TDC placement today per [facility] 2. S/P single lung transplant for ILD-continue immunosuppressant therapy.  3.  ESRD - MWF. HD today post op. K+ 4.1. Use 2.0 K bath. Hold heparin today.  4.  Hypertension/volume  - Wt on adm 71.7 kg. Will check standing wt now (71.6). OP EDW has been variable. Trace edema today. Try to get to OP EDW today. He is 4.1 above OP EDW. May need extra tx tomorrow.  5.  Anemia  - HGB 10.6 No OP ESA. Follow HGB.  6.  Metabolic bone disease - continue VDRA, binders 7.  Nutrition - NPO at present. Doesn't want renal diet. Can do carb mod diet with fluid restrictions when able to eat. 8. DM- per primary  Glynn Freas H. Owens Shark, NP-C 01/14/2019, 11:59 AM  [employer] [phone]

## 2019-01-14 NOTE — Anesthesia Postprocedure Evaluation (Signed)
Anesthesia Post Note  Patient: [name]  Procedure(s) Performed: LIGATION OF ARTERIOVENOUS  FISTULA (Left Arm Upper)     Patient location during evaluation: PACU Anesthesia Type: MAC Level of consciousness: awake and alert Pain management: pain level controlled Vital Signs Assessment: post-procedure vital signs reviewed and stable Respiratory status: spontaneous breathing, nonlabored ventilation and respiratory function stable Cardiovascular status: stable and blood pressure returned to baseline Postop Assessment: no apparent nausea or vomiting Anesthetic complications: no    Last Vitals:  Vitals:   01/14/19 1915 01/14/19 1930  BP: (!) 103/37   Pulse: 78   Resp: 17   Temp:  (!) 36.4 C  SpO2: 92%     Last Pain:  Vitals:   01/14/19 1930  TempSrc:   PainSc: 0-No pain                 Ousman Dise,W. EDMOND

## 2019-01-14 NOTE — Transfer of Care (Signed)
Immediate Anesthesia Transfer of Care Note  Patient: [name]  Procedure(s) Performed: LIGATION OF ARTERIOVENOUS  FISTULA (Left Arm Upper)  Patient Location: PACU  Anesthesia Type:MAC  Level of Consciousness: awake, alert , oriented and patient cooperative  Airway & Oxygen Therapy: Patient Spontanous Breathing and Patient connected to face mask oxygen  Post-op Assessment: Report given to RN and Post -op Vital signs reviewed and stable  Post vital signs: Reviewed and stable  Last Vitals:  Vitals Value Taken Time  BP 106/59 01/14/19 1831  Temp    Pulse 79 01/14/19 1832  Resp 14 01/14/19 1832  SpO2 97 % 01/14/19 1832  Vitals shown include unvalidated device data.  Last Pain:  Vitals:   01/14/19 1253  TempSrc: Oral  PainSc:          Complications: No apparent anesthesia complications

## 2019-01-15 ENCOUNTER — Ambulatory Visit: Payer: 59

## 2019-01-15 ENCOUNTER — Encounter (HOSPITAL_COMMUNITY): Payer: Self-pay | Admitting: Vascular Surgery

## 2019-01-15 ENCOUNTER — Other Ambulatory Visit: Payer: Self-pay

## 2019-01-15 LAB — BASIC METABOLIC PANEL
Anion gap: 15 (ref 5–15)
BUN: 19 mg/dL (ref 8–23)
CO2: 26 mmol/L (ref 22–32)
Calcium: 7.8 mg/dL — ABNORMAL LOW (ref 8.9–10.3)
Chloride: 98 mmol/L (ref 98–111)
Creatinine, Ser: 5.37 mg/dL — ABNORMAL HIGH (ref 0.61–1.24)
GFR calc Af Amer: 12 mL/min — ABNORMAL LOW (ref >60)
GFR calc non Af Amer: 10 mL/min — ABNORMAL LOW (ref >60)
Glucose, Bld: 89 mg/dL (ref 70–99)
Potassium: 3.3 mmol/L — ABNORMAL LOW (ref 3.5–5.1)
Sodium: 139 mmol/L (ref 135–145)

## 2019-01-15 LAB — CBC
HCT: 31.8 % — ABNORMAL LOW (ref 39.0–52.0)
Hemoglobin: 10.3 g/dL — ABNORMAL LOW (ref 13.0–17.0)
MCH: 34.1 pg — ABNORMAL HIGH (ref 26.0–34.0)
MCHC: 32.4 g/dL (ref 30.0–36.0)
MCV: 105.3 fL — ABNORMAL HIGH (ref 80.0–100.0)
Platelets: 149 10*3/uL — ABNORMAL LOW (ref 150–400)
RBC: 3.02 MIL/uL — ABNORMAL LOW (ref 4.22–5.81)
RDW: 14.6 % (ref 11.5–15.5)
WBC: 4.6 10*3/uL (ref 4.0–10.5)
nRBC: 0 % (ref 0.0–0.2)

## 2019-01-15 LAB — GLUCOSE, CAPILLARY: Glucose-Capillary: 99 mg/dL (ref 70–99)

## 2019-01-15 MED ORDER — CLINDAMYCIN HCL 150 MG PO CAPS: 450.0000 mg | ORAL_CAPSULE | Freq: Three times a day (TID) | ORAL | 0 refills | Status: AC

## 2019-01-15 MED ORDER — OXYCODONE-ACETAMINOPHEN 5-325 MG PO TABS: 1.0000 | ORAL_TABLET | ORAL | 0 refills | Status: AC | PRN

## 2019-01-15 NOTE — Progress Notes (Signed)
Patient discharged to home with instructions. 

## 2019-01-15 NOTE — Discharge Summary (Signed)
Physician Discharge Summary  [name] LXB:[id number] DOB: 08/16/1950 DOA: 01/13/2019  PCP: Wenda Low, MD  Admit date: 01/13/2019 Discharge date: 01/15/2019  Admitted From: Home Disposition:  Home  Recommendations for Outpatient Follow-up:  1. Follow up with PCP in 1 week 2. Follow-up with vascular surgery, Dr. Carlis Abbott in 2-3 weeks as scheduled 3. 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 Denies any fever or chills. His last dialysis was on Friday.  Denies nausea vomiting or diarrhea, no abdominal pain, no chest pain no shortness of breath. Vascular surgery was consulted by ED physician and recommended to transfer patient to [facility] for possible surgery in a.m.  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  Patient is afebrile without leukocytosis; although in the setting of immunosuppression secondary to history of lung transplant.  Vascular surgery was consulted and patient underwent revision of his left upper extremity AV fistula with aneurysm plication by Dr. Carlis Abbott on 01/14/2019.  He resumed his normal HD following operative procedure.  He was initially started on vancomycin and Zosyn for  broad-spectrum coverage and will discharge home on clindamycin 450 mg p.o. 3 times daily for 7 days.  Percocet prn for pain control postoperatively. Patient has follow-up scheduled with vascular surgery in 2-3 weeks for postoperative wound check.  History of lung transplant Underwent lung transplantation 2016 at [facility] for ILD.  Follows with lung transplant specialist, Whitman Hero. Continue azathioprine, cyclosporine, Valcyte and cyclosporine  ESRD on HD Monday/Wednesday/Friday Vascular surgery with revision of AV fistula secondary to infection/expansion on 01/14/2019 as above.  Patient had HD session on 01/14/2019, actually ending early morning 01/15/2019.  Seen by nephrology this morning, okay for discharge home and follow-up with his HD clinic to resume HD on his normal schedule.   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)   Immunocompromised state [facility])    Discharge Instructions  Discharge Instructions    Call MD for:  difficulty breathing, headache or visual disturbances   Complete by: As directed    Call MD for:  extreme fatigue   Complete by: As directed    Call MD for:  persistant dizziness or light-headedness   Complete by: As directed    Call MD for:  persistant nausea and vomiting   Complete by: As directed    Call MD for:  redness, tenderness, or signs of infection (pain, swelling, redness, odor or green/yellow discharge around incision site)   Complete by: As directed    Call MD for:  severe uncontrolled pain   Complete by: As directed    Call MD for:  temperature >  100.4   Complete by: As directed    Diet - low sodium heart healthy   Complete by: As directed    Increase activity slowly   Complete by: As directed      Allergies  as of 01/15/2019      Reactions   Levofloxacin Other (See Comments)   LOSS OF CONSCIOUSNESS   Nsaids Other (See Comments)   Patient instructed not to take NSAID's after his lung transplant      Medication List    TAKE these medications   Accu-Chek FastClix Lancets Misc As directed up to 4 times daily   acetaminophen 500 MG tablet Commonly known as: TYLENOL Take 500-1,000 mg by mouth every 6 (six) hours as needed for headache.   aspirin 81 MG tablet Take 81 mg by mouth daily.   azaTHIOprine 50 MG tablet Commonly known as: IMURAN Take 25 mg by mouth See admin instructions. Take 25mg  by mouth on Monday, Wednesday, and Friday after dialysis   calcium acetate 667 MG capsule Commonly known as: PHOSLO Take 1,334 mg by mouth 3 (three) times daily before meals.   clindamycin 150 MG capsule Commonly known as: Cleocin Take 3 capsules (450 mg total) by mouth 3 (three) times daily for 7 days.   cycloSPORINE 25 MG capsule Commonly known as: SANDIMMUNE Take 25 mg by mouth See admin instructions. Take 100mg  by mouth in the morning and 75mg  by mouth at night   dorzolamide 2 % ophthalmic solution Commonly known as: TRUSOPT Place 1 drop into both eyes 2 (two) times daily.   fluticasone 50 MCG/ACT nasal spray Commonly known as: FLONASE Place 2 sprays into the nose daily. What changed:   when to take this  reasons to take this   Immune Globulin 10% 20 GM/200ML Soln Commonly known as: PRIVIGEN Inject 0.5 g/kg into the vein every 3 (three) months.   multivitamin capsule Take 1 capsule by mouth daily.   NovoLIN R 100 units/mL injection Generic drug: insulin regular Inject 5 Units into the skin 3 (three) times daily before meals. Additional units (per sliding scale): BGL 201-250 = 1 unit; 251-300 = 2 units; 301-350 = 3 units; 351-400 = units; >401 = 5 units + CALL LUNG COORDINATOR @ [facility]   omeprazole 40 MG capsule Commonly known as: PRILOSEC Take 40 mg by mouth 3 (three) times  daily with meals.   oxyCODONE-acetaminophen 5-325 MG tablet Commonly known as: PERCOCET/ROXICET Take 1-2 tablets by mouth every 4 (four) hours as needed for up to 7 days for moderate pain.   pravastatin 20 MG tablet Commonly known as: PRAVACHOL Take 20 mg by mouth at bedtime.   prednisoLONE acetate 1 % ophthalmic suspension Commonly known as: PRED FORTE Place 1 drop into both eyes 2 (two) times daily.   Refresh 1.4-0.6 % ophthalmic solution Generic drug: polyvinyl alcohol-povidone Place 1-2 drops into both eyes daily as needed (for dryness).   sertraline 50 MG tablet Commonly known as: ZOLOFT Take 50 mg by mouth daily as needed (depression).   sulfamethoxazole-trimethoprim 400-80 MG tablet Commonly known as: BACTRIM Take 1 tablet by mouth 3 (three) times a week. Monday, Wednesday, and Friday after dialysis   valGANciclovir 450 MG tablet Commonly known as: VALCYTE Take 1 tablet by mouth See admin instructions. Every Monday and Friday AFTER DIALYSIS      Follow-up Information    Marty Heck, MD Follow up in 2 week(s).   Specialty: Vascular Surgery Why: office will cal Contact information: [place]  [place] [address] [phone]        Wenda Low, MD. Call in 1 week(s).   Specialty: Internal Medicine Contact information: [address], [address] [place] [address] [phone]          Allergies  Allergen Reactions  . Levofloxacin Other (See Comments)    LOSS OF CONSCIOUSNESS  . Nsaids Other (See Comments)    Patient instructed not to take NSAID's after his lung transplant    Consultations:  Nephrology  Vascular surgery  Procedures/Studies: Left upper extremity AV fistula revision with aneurysm plication   Subjective: Patient seen and examined at bedside, resting comfortably in bed.  Underwent revision of his AV fistula yesterday and completed hemodialysis early this morning.  Ready for discharge home, okay by  surgery/nephrology.  Complains of some mild tenderness to AV fistula surgical site.  No other complaints or concerns this morning.  Denies headache, no fever/chills/night sweats, no nausea/vomiting/diarrhea, no chest pain, no palpitations, no shortness of breath, no abdominal pain, no weakness, no paresthesias.   Discharge Exam: Vitals:   01/15/19 0200 01/15/19 0226  BP: 108/61 121/65  Pulse: 71 73  Resp:  16  Temp:  97.9 F (36.6 C)  SpO2:  98%   Vitals:   01/15/19 0100 01/15/19 0130 01/15/19 0200 01/15/19 0226  BP: (!) 102/59 (!) 97/56 108/61 121/65  Pulse: 71 70 71 73  Resp: 15   16  Temp:    97.9 F (36.6 C)  TempSrc:    Oral  SpO2:    98%  Weight:      Height:        General: Pt is alert, awake, not in acute distress Cardiovascular: RRR, S1/S2 +, no rubs, no gallops Respiratory: CTA bilaterally, no wheezing, no rhonchi Abdominal: Soft, NT, ND, bowel sounds + Extremities: no edema, no cyanosis, left upper extremity AV fistula site noted, no fluctuance/erythema, Steri-Strips in place    The results of significant diagnostics from this hospitalization (including imaging, microbiology, ancillary and laboratory) are listed below for reference.     Microbiology: Recent Results (from the past 240 hour(s))  SARS Coronavirus 2 [facility] order, Performed in [facility] hospital lab) Nasopharyngeal Nasopharyngeal Swab     Status: None   Collection Time: 01/13/19  4:52 PM   Specimen: Nasopharyngeal Swab  Result Value Ref Range Status   SARS Coronavirus 2 NEGATIVE NEGATIVE Final    Comment: (NOTE) If result is NEGATIVE SARS-CoV-2 target nucleic acids are NOT DETECTED. The SARS-CoV-2 RNA is generally detectable in upper and lower  respiratory specimens during the acute phase of infection. The lowest  concentration of SARS-CoV-2 viral copies this assay can detect is 250  copies / mL. A negative result does not preclude SARS-CoV-2 infection  and should not be used as the sole  basis for treatment or other  patient management decisions.  A negative result may occur with  improper specimen collection / handling, submission of specimen other  than nasopharyngeal swab, presence of viral mutation(s) within the  areas targeted by this assay, and inadequate number of viral copies  (<250 copies / mL). A negative result must be combined with clinical  observations, patient history, and epidemiological information. If result is POSITIVE SARS-CoV-2 target nucleic acids are DETECTED. The SARS-CoV-2 RNA is generally detectable in upper and lower  respiratory specimens dur ing the acute phase of infection.  Positive  results are indicative of active infection with SARS-CoV-2.  Clinical  correlation with patient history and other  diagnostic information is  necessary to determine patient infection status.  Positive results do  not rule out bacterial infection or co-infection with other viruses. If result is PRESUMPTIVE POSTIVE SARS-CoV-2 nucleic acids MAY BE PRESENT.   A presumptive positive result was obtained on the submitted specimen  and confirmed on repeat testing.  While 2019 novel coronavirus  (SARS-CoV-2) nucleic acids may be present in the submitted sample  additional confirmatory testing may be necessary for epidemiological  and / or clinical management purposes  to differentiate between  SARS-CoV-2 and other Sarbecovirus currently known to infect humans.  If clinically indicated additional testing with an alternate test  methodology [phone]) is advised. The SARS-CoV-2 RNA is generally  detectable in upper and lower respiratory sp ecimens during the acute  phase of infection. The expected result is Negative. Fact Sheet for Patients:  StrictlyIdeas.no Fact Sheet for Healthcare Providers: BankingDealers.co.za This test is not yet approved or cleared by the [place] FDA and has been authorized for detection  and/or diagnosis of SARS-CoV-2 by FDA under an Emergency Use Authorization (EUA).  This EUA will remain in effect (meaning this test can be used) for the duration of the COVID-19 declaration under Section 564(b)(1) of the Act, 21 U.S.C. section 360bbb-3(b)(1), unless the authorization is terminated or revoked sooner. Performed at [facility], [place] [address] [place], [place] [address]   Surgical pcr screen     Status: None   Collection Time: 01/14/19  2:47 AM   Specimen: Nasal Mucosa; Nasal Swab  Result Value Ref Range Status   MRSA, PCR NEGATIVE NEGATIVE Final   Staphylococcus aureus NEGATIVE NEGATIVE Final    Comment: (NOTE) The Xpert SA Assay (FDA approved for NASAL specimens in patients 60 years of age and older), is one component of a comprehensive surveillance program. It is not intended to diagnose infection nor to guide or monitor treatment. Performed at [facility], [place] [address], [place], [place] [address]      Labs: BNP (last 3 results) No results for input(s): BNP in the last 8760 hours. Basic Metabolic Panel: Recent Labs  Lab 01/13/19 1603 01/14/19 0330 01/14/19 2251 01/15/19 0500  NA 137 139 136 139  K 4.2 4.1 4.4 3.3*  CL 98 101 100 98  CO2 24 23 19* 26  GLUCOSE 200* 96 157* 89  BUN 56* 56* 64* 19  CREATININE 9.49* 10.62* 11.95* 5.37*  CALCIUM 9.5 8.8* 8.2* 7.8*  PHOS  --   --  5.5*  --    Liver Function Tests: Recent Labs  Lab 01/13/19 1603 01/14/19 2251  AST 35  --   ALT 39  --   ALKPHOS 199*  --   BILITOT 1.0  --   PROT 7.6  --   ALBUMIN 3.9 2.7*   No results for input(s): LIPASE, AMYLASE in the last 168 hours. No results for input(s): AMMONIA in the last 168 hours. CBC: Recent Labs  Lab 01/13/19 1603 01/14/19 0330 01/14/19 2251 01/15/19 0500  WBC 6.7 5.0 4.6 4.6  NEUTROABS 5.7  --   --   --   HGB 11.9* 10.6* 10.6* 10.3*  HCT 36.6* 32.3* 32.3* 31.8*  MCV 108.6* 104.5* 105.9* 105.3*  PLT 179 161 159  149*   Cardiac Enzymes: No results for input(s): CKTOTAL, CKMB, CKMBINDEX, TROPONINI in the last 168 hours. BNP: Invalid input(s): POCBNP CBG: Recent Labs  Lab 01/14/19 1133 01/14/19 1345 01/14/19 1832 01/14/19 2059 01/15/19 0804  GLUCAP 93 92 78  93 99   D-Dimer No results for input(s): DDIMER in the last 72 hours. Hgb A1c Recent Labs    01/14/19 0330  HGBA1C 7.3*   Lipid Profile No results for input(s): CHOL, HDL, LDLCALC, TRIG, CHOLHDL, LDLDIRECT in the last 72 hours. Thyroid function studies No results for input(s): TSH, T4TOTAL, T3FREE, THYROIDAB in the last 72 hours.  Invalid input(s): FREET3 Anemia work up No results for input(s): VITAMINB12, FOLATE, FERRITIN, TIBC, IRON, RETICCTPCT in the last 72 hours. Urinalysis    Component Value Date/Time   COLORURINE AMBER (A) 12/31/2015 0515   APPEARANCEUR CLOUDY (A) 12/31/2015 0515   LABSPEC 1.016 12/31/2015 0515   PHURINE 7.0 12/31/2015 0515   GLUCOSEU NEGATIVE 12/31/2015 0515   HGBUR NEGATIVE 12/31/2015 0515   BILIRUBINUR SMALL (A) 12/31/2015 0515   KETONESUR 15 (A) 12/31/2015 0515   PROTEINUR 100 (A) 12/31/2015 0515   UROBILINOGEN 2.0 (H) 08/15/2013 1916   NITRITE NEGATIVE 12/31/2015 0515   LEUKOCYTESUR NEGATIVE 12/31/2015 0515   Sepsis Labs Invalid input(s): PROCALCITONIN,  WBC,  LACTICIDVEN Microbiology Recent Results (from the past 240 hour(s))  SARS Coronavirus 2 [facility] order, Performed in [facility] hospital lab) Nasopharyngeal Nasopharyngeal Swab     Status: None   Collection Time: 01/13/19  4:52 PM   Specimen: Nasopharyngeal Swab  Result Value Ref Range Status   SARS Coronavirus 2 NEGATIVE NEGATIVE Final    Comment: (NOTE) If result is NEGATIVE SARS-CoV-2 target nucleic acids are NOT DETECTED. The SARS-CoV-2 RNA is generally detectable in upper and lower  respiratory specimens during the acute phase of infection. The lowest  concentration of SARS-CoV-2 viral copies this assay can detect is 250   copies / mL. A negative result does not preclude SARS-CoV-2 infection  and should not be used as the sole basis for treatment or other  patient management decisions.  A negative result may occur with  improper specimen collection / handling, submission of specimen other  than nasopharyngeal swab, presence of viral mutation(s) within the  areas targeted by this assay, and inadequate number of viral copies  (<250 copies / mL). A negative result must be combined with clinical  observations, patient history, and epidemiological information. If result is POSITIVE SARS-CoV-2 target nucleic acids are DETECTED. The SARS-CoV-2 RNA is generally detectable in upper and lower  respiratory specimens dur ing the acute phase of infection.  Positive  results are indicative of active infection with SARS-CoV-2.  Clinical  correlation with patient history and other diagnostic information is  necessary to determine patient infection status.  Positive results do  not rule out bacterial infection or co-infection with other viruses. If result is PRESUMPTIVE POSTIVE SARS-CoV-2 nucleic acids MAY BE PRESENT.   A presumptive positive result was obtained on the submitted specimen  and confirmed on repeat testing.  While 2019 novel coronavirus  (SARS-CoV-2) nucleic acids may be present in the submitted sample  additional confirmatory testing may be necessary for epidemiological  and / or clinical management purposes  to differentiate between  SARS-CoV-2 and other Sarbecovirus currently known to infect humans.  If clinically indicated additional testing with an alternate test  methodology [phone]) is advised. The SARS-CoV-2 RNA is generally  detectable in upper and lower respiratory sp ecimens during the acute  phase of infection. The expected result is Negative. Fact Sheet for Patients:  StrictlyIdeas.no Fact Sheet for Healthcare  Providers: BankingDealers.co.za This test is not yet approved or cleared by the [place] FDA and has been authorized for detection and/or diagnosis  of SARS-CoV-2 by FDA under an Emergency Use Authorization (EUA).  This EUA will remain in effect (meaning this test can be used) for the duration of the COVID-19 declaration under Section 564(b)(1) of the Act, 21 U.S.C. section 360bbb-3(b)(1), unless the authorization is terminated or revoked sooner. Performed at [facility], [place] [address] [place], [place] [address]   Surgical pcr screen     Status: None   Collection Time: 01/14/19  2:47 AM   Specimen: Nasal Mucosa; Nasal Swab  Result Value Ref Range Status   MRSA, PCR NEGATIVE NEGATIVE Final   Staphylococcus aureus NEGATIVE NEGATIVE Final    Comment: (NOTE) The Xpert SA Assay (FDA approved for NASAL specimens in patients 7 years of age and older), is one component of a comprehensive surveillance program. It is not intended to diagnose infection nor to guide or monitor treatment. Performed at [facility], [place] [address] [place], [place] [address]      Time coordinating discharge: Over 30 minutes  SIGNED:   Nava Song J [place] DO  [facility] 01/15/2019, 9:51 AM

## 2019-01-15 NOTE — Progress Notes (Signed)
[facility]  Subjective  - States left arm is sore.  Dialyzed last night.   Objective 121/65 73 97.9 F (36.6 C) (Oral) 16 98%  Intake/Output Summary (Last 24 hours) at 01/15/2019 0759 Last data filed at 01/15/2019 0634 Gross per 24 hour  Intake 859.94 ml  Output 520 ml  Net 339.94 ml    Left brachiocepahlic AVF with thrill Incision c/d/i Palpable left ulnar pulse  Laboratory Lab Results: Recent Labs    01/14/19 2251 01/15/19 0500  WBC 4.6 4.6  HGB 10.6* 10.3*  HCT 32.3* 31.8*  PLT 159 149*   BMET Recent Labs    01/14/19 2251 01/15/19 0500  NA 136 139  K 4.4 3.3*  CL 100 98  CO2 19* 26  GLUCOSE 157* 89  BUN 64* 19  CREATININE 11.95* 5.37*  CALCIUM 8.2* 7.8*    COAG Lab Results  Component Value Date   INR 1.0 01/14/2019   INR 1.00 03/21/2016   INR 1.04 12/30/2015   No results found for: PTT  Assessment/Planning: POD #1 s/p plication of left arm brachiocephalic AVF for ulceration.  Good thrill.  Palpable ulnar at wrist.  Dialyzed last night.  OK to use fistula by sticking above and below incision.  Can be discharged from our standpoint.  Will arrange follow-up for wound check in 2-3 weeks.    Marty Heck 01/15/2019 7:59 AM --

## 2019-01-15 NOTE — Progress Notes (Signed)
[facility] Progress Note   Subjective:   Patient seen in room. L arm sore post-op. Had dialysis overnight. Denies SOB, dyspnea, CP, palpitations, abdominal pain, N/V/D. Planning to discharge today.   Objective Vitals:   01/15/19 0100 01/15/19 0130 01/15/19 0200 01/15/19 0226  BP: (!) 102/59 (!) 97/56 108/61 121/65  Pulse: 71 70 71 73  Resp: 15   16  Temp:    97.9 F (36.6 C)  TempSrc:    Oral  SpO2:    98%  Weight:      Height:       Physical Exam General: Well developed, alert male in NAd Heart: RRR, no murmurs, rubs or gallops Lungs: CTA bilaterally, no wheezing, rhonchi or rales Abdomen: Soft, non-tender non-distended Extremities: No peripheral edema Dialysis Access: LUE AVF bandage in place, no erythema/bleeding  Additional Objective Labs: Basic Metabolic Panel: Recent Labs  Lab 01/14/19 0330 01/14/19 2251 01/15/19 0500  NA 139 136 139  K 4.1 4.4 3.3*  CL 101 100 98  CO2 23 19* 26  GLUCOSE 96 157* 89  BUN 56* 64* 19  CREATININE 10.62* 11.95* 5.37*  CALCIUM 8.8* 8.2* 7.8*  PHOS  --  5.5*  --    Liver Function Tests: Recent Labs  Lab 01/13/19 1603 01/14/19 2251  AST 35  --   ALT 39  --   ALKPHOS 199*  --   BILITOT 1.0  --   PROT 7.6  --   ALBUMIN 3.9 2.7*   CBC: Recent Labs  Lab 01/13/19 1603 01/14/19 0330 01/14/19 2251 01/15/19 0500  WBC 6.7 5.0 4.6 4.6  NEUTROABS 5.7  --   --   --   HGB 11.9* 10.6* 10.6* 10.3*  HCT 36.6* 32.3* 32.3* 31.8*  MCV 108.6* 104.5* 105.9* 105.3*  PLT 179 161 159 149*   Blood Culture    Component Value Date/Time   SDES BLOOD RIGHT HAND 03/21/2016 1951   SPECREQUEST BOTTLES DRAWN AEROBIC AND ANAEROBIC 5CC 03/21/2016 1951   CULT NO GROWTH 5 DAYS 03/21/2016 1951   REPTSTATUS 03/26/2016 FINAL 03/21/2016 1951   CBG: Recent Labs  Lab 01/14/19 1133 01/14/19 1345 01/14/19 1832 01/14/19 2059 01/15/19 0804  GLUCAP 93 92 78 93 99   Medications: . sodium chloride    . piperacillin-tazobactam     . piperacillin-tazobactam (ZOSYN)  IV 12.5 mL/hr at 01/15/19 0634  . [START ON 01/16/2019] vancomycin     . aspirin  81 mg Oral Daily  . azaTHIOprine  25 mg Oral Q M,W,F-1800  . calcium acetate  1,334 mg Oral TID WC  . Chlorhexidine Gluconate Cloth  6 each Topical Q0600  . cycloSPORINE  100 mg Oral q morning - 10a   And  . cycloSPORINE  75 mg Oral QHS  . dorzolamide  1 drop Both Eyes BID  . doxercalciferol  2 mcg Intravenous Q M,W,F-HD  . heparin  5,000 Units Subcutaneous Q8H  . insulin aspart  0-9 Units Subcutaneous TID WC  . lidocaine  1 patch Transdermal Q24H  . pantoprazole  40 mg Oral Daily  . pravastatin  20 mg Oral QHS  . prednisoLONE acetate  1 drop Both Eyes BID  . sodium chloride flush  3 mL Intravenous Q12H  . sulfamethoxazole-trimethoprim  1 tablet Oral Q M,W,F-1800  . valGANciclovir  450 mg Oral Once per day on Mon Fri    Dialysis Orders: [facility] MWF 4 hrs 160NRe 400/800 67.5 kg 2.0 K/ 2.25Ca UFP 4  -Heparin 1800 units IV  TIW -Hectorol 2 mcg IV TIW (Last PTH 227 Phos 4.6 Ca 8.2 C Ca 8.6 01/09/19)  Assessment/Plan: 1. Infected, aneurysmal AVF-Started on Vanc per primary, s/p revision 01/14/2019. WBC 6.7 on adm. No Cx pending. Per vascular surgery, ok to use fistula above/below incision. Outpatient follow up in 2-3 weeks.  2. S/P single lung transplant for ILD-continue immunosuppressant therapy.  3.  ESRD - MWF. Had HD overnight. K+ 3.3 after dialysis. Repeat labs at outpatient center, no PO potassium at this time. OK to discharge from renal stand point for outpatient HD tomorrow. Will hold heparin for 1 week post op. Reports he has an appointment at [facility] next month for transplant evaluation.  4.  Hypertension/volume - BP soft overnight, now stable. 5kg over EDW by weights pre-HD here but no edema/SOB. Weights variable at outpatient clinic, looks like he gets to EDW by the end of the week and tolerates large UF. Reinforced fluid restrictions, to return to outpatient unit for  HD tomorrow.  5.  Anemia  - HGB 10.3 No OP ESA, will start low dose at discharge. Follow HGB.  6.  Metabolic bone disease - continue VDRA, binders 7.  Nutrition -  Doesn't want renal diet. Can do carb mod diet with fluid restrictions when able to eat. 8. DM- per primary  Anice Paganini, PA-C 01/15/2019, 8:59 AM  [facility] Pager: [phone]

## 2019-01-16 DIAGNOSIS — N2581 Secondary hyperparathyroidism of renal origin: Secondary | ICD-10-CM | POA: Diagnosis not present

## 2019-01-16 DIAGNOSIS — Z992 Dependence on renal dialysis: Secondary | ICD-10-CM | POA: Diagnosis not present

## 2019-01-16 DIAGNOSIS — N186 End stage renal disease: Secondary | ICD-10-CM | POA: Diagnosis not present

## 2019-01-16 DIAGNOSIS — D631 Anemia in chronic kidney disease: Secondary | ICD-10-CM | POA: Diagnosis not present

## 2019-01-16 DIAGNOSIS — T8579XA Infection and inflammatory reaction due to other internal prosthetic devices, implants and grafts, initial encounter: Secondary | ICD-10-CM | POA: Diagnosis not present

## 2019-01-16 DIAGNOSIS — E876 Hypokalemia: Secondary | ICD-10-CM | POA: Diagnosis not present

## 2019-01-16 LAB — HEPATITIS B E ANTIGEN: Hep B E Ag: NEGATIVE

## 2019-01-17 ENCOUNTER — Ambulatory Visit: Payer: 59

## 2019-01-18 DIAGNOSIS — E876 Hypokalemia: Secondary | ICD-10-CM | POA: Diagnosis not present

## 2019-01-18 DIAGNOSIS — N186 End stage renal disease: Secondary | ICD-10-CM | POA: Diagnosis not present

## 2019-01-18 DIAGNOSIS — T8579XA Infection and inflammatory reaction due to other internal prosthetic devices, implants and grafts, initial encounter: Secondary | ICD-10-CM | POA: Diagnosis not present

## 2019-01-18 DIAGNOSIS — N2581 Secondary hyperparathyroidism of renal origin: Secondary | ICD-10-CM | POA: Diagnosis not present

## 2019-01-18 DIAGNOSIS — D631 Anemia in chronic kidney disease: Secondary | ICD-10-CM | POA: Diagnosis not present

## 2019-01-18 DIAGNOSIS — Z992 Dependence on renal dialysis: Secondary | ICD-10-CM | POA: Diagnosis not present

## 2019-01-21 DIAGNOSIS — E876 Hypokalemia: Secondary | ICD-10-CM | POA: Diagnosis not present

## 2019-01-21 DIAGNOSIS — T8579XA Infection and inflammatory reaction due to other internal prosthetic devices, implants and grafts, initial encounter: Secondary | ICD-10-CM | POA: Diagnosis not present

## 2019-01-21 DIAGNOSIS — D631 Anemia in chronic kidney disease: Secondary | ICD-10-CM | POA: Diagnosis not present

## 2019-01-21 DIAGNOSIS — Z992 Dependence on renal dialysis: Secondary | ICD-10-CM | POA: Diagnosis not present

## 2019-01-21 DIAGNOSIS — N2581 Secondary hyperparathyroidism of renal origin: Secondary | ICD-10-CM | POA: Diagnosis not present

## 2019-01-21 DIAGNOSIS — N186 End stage renal disease: Secondary | ICD-10-CM | POA: Diagnosis not present

## 2019-01-22 ENCOUNTER — Other Ambulatory Visit: Payer: Self-pay | Admitting: Internal Medicine

## 2019-01-22 ENCOUNTER — Ambulatory Visit
Admission: RE | Admit: 2019-01-22 | Discharge: 2019-01-22 | Disposition: A | Discharge status: Home / Self Care | Payer: Medicare Other | Source: Ambulatory Visit | Attending: Internal Medicine | Admitting: Internal Medicine

## 2019-01-22 DIAGNOSIS — Z942 Lung transplant status: Secondary | ICD-10-CM | POA: Diagnosis not present

## 2019-01-22 DIAGNOSIS — S299XXA Unspecified injury of thorax, initial encounter: Secondary | ICD-10-CM | POA: Diagnosis not present

## 2019-01-22 DIAGNOSIS — R0781 Pleurodynia: Secondary | ICD-10-CM

## 2019-01-22 DIAGNOSIS — Z79899 Other long term (current) drug therapy: Secondary | ICD-10-CM | POA: Diagnosis not present

## 2019-01-22 DIAGNOSIS — T798XXA Other early complications of trauma, initial encounter: Secondary | ICD-10-CM | POA: Diagnosis not present

## 2019-01-22 DIAGNOSIS — R21 Rash and other nonspecific skin eruption: Secondary | ICD-10-CM | POA: Diagnosis not present

## 2019-01-22 DIAGNOSIS — N19 Unspecified kidney failure: Secondary | ICD-10-CM | POA: Diagnosis not present

## 2019-01-22 DIAGNOSIS — K5900 Constipation, unspecified: Secondary | ICD-10-CM | POA: Diagnosis not present

## 2019-01-22 DIAGNOSIS — M549 Dorsalgia, unspecified: Secondary | ICD-10-CM | POA: Diagnosis not present

## 2019-01-22 DIAGNOSIS — Z5181 Encounter for therapeutic drug level monitoring: Secondary | ICD-10-CM | POA: Diagnosis not present

## 2019-01-22 DIAGNOSIS — T86819 Unspecified complication of lung transplant: Secondary | ICD-10-CM | POA: Diagnosis not present

## 2019-01-22 DIAGNOSIS — Z298 Encounter for other specified prophylactic measures: Secondary | ICD-10-CM | POA: Diagnosis not present

## 2019-01-23 DIAGNOSIS — N2581 Secondary hyperparathyroidism of renal origin: Secondary | ICD-10-CM | POA: Diagnosis not present

## 2019-01-23 DIAGNOSIS — D631 Anemia in chronic kidney disease: Secondary | ICD-10-CM | POA: Diagnosis not present

## 2019-01-23 DIAGNOSIS — Z992 Dependence on renal dialysis: Secondary | ICD-10-CM | POA: Diagnosis not present

## 2019-01-23 DIAGNOSIS — T8579XA Infection and inflammatory reaction due to other internal prosthetic devices, implants and grafts, initial encounter: Secondary | ICD-10-CM | POA: Diagnosis not present

## 2019-01-23 DIAGNOSIS — N186 End stage renal disease: Secondary | ICD-10-CM | POA: Diagnosis not present

## 2019-01-23 DIAGNOSIS — E876 Hypokalemia: Secondary | ICD-10-CM | POA: Diagnosis not present

## 2019-01-25 DIAGNOSIS — N186 End stage renal disease: Secondary | ICD-10-CM | POA: Diagnosis not present

## 2019-01-25 DIAGNOSIS — E876 Hypokalemia: Secondary | ICD-10-CM | POA: Diagnosis not present

## 2019-01-25 DIAGNOSIS — T8579XA Infection and inflammatory reaction due to other internal prosthetic devices, implants and grafts, initial encounter: Secondary | ICD-10-CM | POA: Diagnosis not present

## 2019-01-25 DIAGNOSIS — N2581 Secondary hyperparathyroidism of renal origin: Secondary | ICD-10-CM | POA: Diagnosis not present

## 2019-01-25 DIAGNOSIS — D631 Anemia in chronic kidney disease: Secondary | ICD-10-CM | POA: Diagnosis not present

## 2019-01-25 DIAGNOSIS — Z992 Dependence on renal dialysis: Secondary | ICD-10-CM | POA: Diagnosis not present

## 2019-01-28 DIAGNOSIS — N2581 Secondary hyperparathyroidism of renal origin: Secondary | ICD-10-CM | POA: Diagnosis not present

## 2019-01-28 DIAGNOSIS — T8579XA Infection and inflammatory reaction due to other internal prosthetic devices, implants and grafts, initial encounter: Secondary | ICD-10-CM | POA: Diagnosis not present

## 2019-01-28 DIAGNOSIS — E876 Hypokalemia: Secondary | ICD-10-CM | POA: Diagnosis not present

## 2019-01-28 DIAGNOSIS — N186 End stage renal disease: Secondary | ICD-10-CM | POA: Diagnosis not present

## 2019-01-28 DIAGNOSIS — D631 Anemia in chronic kidney disease: Secondary | ICD-10-CM | POA: Diagnosis not present

## 2019-01-28 DIAGNOSIS — Z992 Dependence on renal dialysis: Secondary | ICD-10-CM | POA: Diagnosis not present

## 2019-01-30 DIAGNOSIS — T8579XA Infection and inflammatory reaction due to other internal prosthetic devices, implants and grafts, initial encounter: Secondary | ICD-10-CM | POA: Diagnosis not present

## 2019-01-30 DIAGNOSIS — N186 End stage renal disease: Secondary | ICD-10-CM | POA: Diagnosis not present

## 2019-01-30 DIAGNOSIS — N2581 Secondary hyperparathyroidism of renal origin: Secondary | ICD-10-CM | POA: Diagnosis not present

## 2019-01-30 DIAGNOSIS — E876 Hypokalemia: Secondary | ICD-10-CM | POA: Diagnosis not present

## 2019-01-30 DIAGNOSIS — D631 Anemia in chronic kidney disease: Secondary | ICD-10-CM | POA: Diagnosis not present

## 2019-01-30 DIAGNOSIS — Z992 Dependence on renal dialysis: Secondary | ICD-10-CM | POA: Diagnosis not present

## 2019-01-31 DIAGNOSIS — H04223 Epiphora due to insufficient drainage, bilateral lacrimal glands: Secondary | ICD-10-CM | POA: Diagnosis not present

## 2019-02-01 DIAGNOSIS — N186 End stage renal disease: Secondary | ICD-10-CM | POA: Diagnosis not present

## 2019-02-01 DIAGNOSIS — Z992 Dependence on renal dialysis: Secondary | ICD-10-CM | POA: Diagnosis not present

## 2019-02-01 DIAGNOSIS — N2581 Secondary hyperparathyroidism of renal origin: Secondary | ICD-10-CM | POA: Diagnosis not present

## 2019-02-01 DIAGNOSIS — E876 Hypokalemia: Secondary | ICD-10-CM | POA: Diagnosis not present

## 2019-02-01 DIAGNOSIS — T8579XA Infection and inflammatory reaction due to other internal prosthetic devices, implants and grafts, initial encounter: Secondary | ICD-10-CM | POA: Diagnosis not present

## 2019-02-01 DIAGNOSIS — D631 Anemia in chronic kidney disease: Secondary | ICD-10-CM | POA: Diagnosis not present

## 2019-02-04 DIAGNOSIS — E876 Hypokalemia: Secondary | ICD-10-CM | POA: Diagnosis not present

## 2019-02-04 DIAGNOSIS — Z992 Dependence on renal dialysis: Secondary | ICD-10-CM | POA: Diagnosis not present

## 2019-02-04 DIAGNOSIS — T8579XA Infection and inflammatory reaction due to other internal prosthetic devices, implants and grafts, initial encounter: Secondary | ICD-10-CM | POA: Diagnosis not present

## 2019-02-04 DIAGNOSIS — N186 End stage renal disease: Secondary | ICD-10-CM | POA: Diagnosis not present

## 2019-02-04 DIAGNOSIS — D631 Anemia in chronic kidney disease: Secondary | ICD-10-CM | POA: Diagnosis not present

## 2019-02-04 DIAGNOSIS — N2581 Secondary hyperparathyroidism of renal origin: Secondary | ICD-10-CM | POA: Diagnosis not present

## 2019-02-04 NOTE — Progress Notes (Addendum)
POST OPERATIVE OFFICE NOTE    CC:  F/u for surgery  HPI:  This is a 68 y.o. male who is s/p LUE AVF revision with aneurysm plication by Dr. Carlis Abbott on 01/14/2019 and presents today for follow up.   His fistula was used the night of surgery and worked fine.  It was ok to stick above and below incision for HD.  He did have a palpable left ulnar pulse POD 1.   He presents today for follow up.  He states that they are using his fistula above and below.  There is a scab over the fistula.    He states around 11 or 12 earlier today, he was coming down the steps and his right leg became weak and he fell.  He states that he has had some back issues in the past but never had weakness and tingling in his leg like this.  He continues to have some weakness in his right leg.  He denies right upper extremity weakness or slurred speech.   He states he has never had a stroke.    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 Wynetta Emery.    Allergies  Allergen Reactions  . Levofloxacin Other (See Comments)    LOSS OF CONSCIOUSNESS  . Nsaids Other (See Comments)    Patient instructed not to take NSAID's after his lung transplant    Current Outpatient Medications  Medication Sig Dispense Refill  . ACCU-CHEK FASTCLIX LANCETS MISC As directed up to 4 times daily  3  . acetaminophen (TYLENOL) 500 MG tablet Take 500-1,000 mg by mouth every 6 (six) hours as needed for headache.    Marland Kitchen aspirin 81 MG tablet Take 81 mg by mouth daily.     Marland Kitchen azaTHIOprine (IMURAN) 50 MG tablet Take 25 mg by mouth See admin instructions. Take 25mg  by mouth on Monday, Wednesday, and Friday after dialysis    . calcium acetate (PHOSLO) 667 MG capsule Take 1,334 mg by mouth 3 (three) times daily before meals.     . cycloSPORINE (SANDIMMUNE) 25 MG capsule Take 25 mg by mouth See admin instructions. Take 100mg  by mouth in the morning and 75mg  by mouth at night    . dorzolamide (TRUSOPT) 2 % ophthalmic solution  Place 1 drop into both eyes 2 (two) times daily.  3  . fluticasone (FLONASE) 50 MCG/ACT nasal spray Place 2 sprays into the nose daily. (Patient taking differently: Place 2 sprays into the nose daily as needed for allergies. ) 30 g 2  . Immune Globulin 10% (PRIVIGEN) 20 GM/200ML SOLN Inject 0.5 g/kg into the vein every 3 (three) months.     . insulin regular (NOVOLIN R) 100 units/mL injection Inject 5 Units into the skin 3 (three) times daily before meals. Additional units (per sliding scale): BGL 201-250 = 1 unit; 251-300 = 2 units; 301-350 = 3 units; 351-400 = units; >401 = 5 units + CALL LUNG COORDINATOR @ [facility]    . Multiple Vitamin (MULTIVITAMIN) capsule Take 1 capsule by mouth daily.     Marland Kitchen omeprazole (PRILOSEC) 40 MG capsule Take 40 mg by mouth 3 (three) times daily with meals.     . polyvinyl alcohol-povidone (REFRESH) 1.4-0.6 % ophthalmic solution Place 1-2 drops into both eyes daily as needed (for dryness).    . pravastatin (PRAVACHOL) 20 MG tablet Take 20 mg by mouth at bedtime.     . prednisoLONE acetate (PRED FORTE) 1 % ophthalmic  suspension Place 1 drop into both eyes 2 (two) times daily.   0  . sertraline (ZOLOFT) 50 MG tablet Take 50 mg by mouth daily as needed (depression).     Marland Kitchen sulfamethoxazole-trimethoprim (BACTRIM,SEPTRA) 400-80 MG tablet Take 1 tablet by mouth 3 (three) times a week. Monday, Wednesday, and Friday after dialysis    . valGANciclovir (VALCYTE) 450 MG tablet Take 1 tablet by mouth See admin instructions. Every Monday and Friday AFTER DIALYSIS     No current facility-administered medications for this visit.      ROS:  See HPI  Physical Exam:  Today's Vitals   02/05/19 1404  BP: 129/68  Pulse: 73  Resp: 14  Temp: 97.8 F (36.6 C)  TempSrc: Temporal  SpO2: 96%  Weight: 152 lb 11.2 oz (69.3 kg)  Height: 5\' 3"  (1.6 m)   Body mass index is 27.05 kg/m.   Incision:  Healing.  There is a hematoma under the incision as well as a scab lateral to the incision.   There is a palpable thrill over the fistula.  His radial artery is not palpable.   Extremities:  RLE weakness with raising leg off bed as well as dorsiflexion and plantar flexion.  RUE 5/5.   Vascular:  Left carotid bruit is present.  There is no right carotid bruit that is audible.   Assessment/Plan:  This is a 68 y.o. male who is s/p: LUE AVF revision with aneurysm plication by Dr. Carlis Abbott on 01/14/2019   -pt with new RLE weakness that occurred about 11 or 12 today.  He states this is new and continues to have weakness in our office.  He does not have any deficit of his right arm and did not have any slurred speech.  Pt examined with Dr. Carlis Abbott and pt is advised to go to the ER to be evaluated to rule out stroke or mini stroke.  Explained to pt that he does have a left carotid bruit and given his sx, he needs to go straight to the ER to be evaluated.  This was discussed with he and his wife.    -regarding his fistula, he does have some fullness at the incision site.  Dr. Carlis Abbott evaluated with SonoSite and did not see much hematoma.  Discussed with pt that the HD center should be sticking away from the operative site to rest the fistula in that area.  I did speak with Starla at the HD center and she will make note that they should be sticking lower than they are currently sticking the fistula.    Pt will f/u in 2-3 weeks to evaluate left arm.     Leontine Locket, PA-C [facility] [phone]  Clinic MD:  Carlis Abbott

## 2019-02-05 ENCOUNTER — Ambulatory Visit (INDEPENDENT_AMBULATORY_CARE_PROVIDER_SITE_OTHER): Payer: Self-pay | Admitting: Physician Assistant

## 2019-02-05 ENCOUNTER — Encounter (HOSPITAL_COMMUNITY): Payer: Self-pay | Admitting: *Deleted

## 2019-02-05 ENCOUNTER — Emergency Department (HOSPITAL_COMMUNITY): Payer: Medicare Other

## 2019-02-05 ENCOUNTER — Emergency Department (HOSPITAL_COMMUNITY)
Admission: EM | Admit: 2019-02-05 | Discharge: 2019-02-05 | Disposition: A | Discharge status: Home / Self Care | Payer: Medicare Other | Attending: Emergency Medicine | Admitting: Emergency Medicine

## 2019-02-05 ENCOUNTER — Other Ambulatory Visit: Payer: Self-pay

## 2019-02-05 VITALS — BP 129/68 | HR 73 | Temp 97.8°F | Resp 14 | Ht 63.0 in | Wt 152.7 lb

## 2019-02-05 DIAGNOSIS — I120 Hypertensive chronic kidney disease with stage 5 chronic kidney disease or end stage renal disease: Secondary | ICD-10-CM | POA: Insufficient documentation

## 2019-02-05 DIAGNOSIS — M5126 Other intervertebral disc displacement, lumbar region: Secondary | ICD-10-CM | POA: Diagnosis not present

## 2019-02-05 DIAGNOSIS — E1122 Type 2 diabetes mellitus with diabetic chronic kidney disease: Secondary | ICD-10-CM | POA: Insufficient documentation

## 2019-02-05 DIAGNOSIS — T86819 Unspecified complication of lung transplant: Secondary | ICD-10-CM | POA: Diagnosis not present

## 2019-02-05 DIAGNOSIS — I6782 Cerebral ischemia: Secondary | ICD-10-CM | POA: Diagnosis not present

## 2019-02-05 DIAGNOSIS — N186 End stage renal disease: Secondary | ICD-10-CM | POA: Insufficient documentation

## 2019-02-05 DIAGNOSIS — Z992 Dependence on renal dialysis: Secondary | ICD-10-CM

## 2019-02-05 DIAGNOSIS — Z87891 Personal history of nicotine dependence: Secondary | ICD-10-CM | POA: Diagnosis not present

## 2019-02-05 DIAGNOSIS — Z942 Lung transplant status: Secondary | ICD-10-CM | POA: Diagnosis not present

## 2019-02-05 DIAGNOSIS — Z79899 Other long term (current) drug therapy: Secondary | ICD-10-CM | POA: Insufficient documentation

## 2019-02-05 DIAGNOSIS — R29898 Other symptoms and signs involving the musculoskeletal system: Secondary | ICD-10-CM | POA: Insufficient documentation

## 2019-02-05 DIAGNOSIS — Z7982 Long term (current) use of aspirin: Secondary | ICD-10-CM | POA: Insufficient documentation

## 2019-02-05 DIAGNOSIS — R531 Weakness: Secondary | ICD-10-CM | POA: Diagnosis not present

## 2019-02-05 DIAGNOSIS — Z794 Long term (current) use of insulin: Secondary | ICD-10-CM | POA: Diagnosis not present

## 2019-02-05 DIAGNOSIS — M6281 Muscle weakness (generalized): Secondary | ICD-10-CM | POA: Diagnosis not present

## 2019-02-05 LAB — COMPREHENSIVE METABOLIC PANEL
ALT: 22 U/L (ref 0–44)
AST: 53 U/L — ABNORMAL HIGH (ref 15–41)
Albumin: 3.2 g/dL — ABNORMAL LOW (ref 3.5–5.0)
Alkaline Phosphatase: 279 U/L — ABNORMAL HIGH (ref 38–126)
Anion gap: 17 — ABNORMAL HIGH (ref 5–15)
BUN: 34 mg/dL — ABNORMAL HIGH (ref 8–23)
CO2: 20 mmol/L — ABNORMAL LOW (ref 22–32)
Calcium: 9.2 mg/dL (ref 8.9–10.3)
Chloride: 97 mmol/L — ABNORMAL LOW (ref 98–111)
Creatinine, Ser: 8.11 mg/dL — ABNORMAL HIGH (ref 0.61–1.24)
GFR calc Af Amer: 7 mL/min — ABNORMAL LOW (ref >60)
GFR calc non Af Amer: 6 mL/min — ABNORMAL LOW (ref >60)
Glucose, Bld: 227 mg/dL — ABNORMAL HIGH (ref 70–99)
Potassium: 5.3 mmol/L — ABNORMAL HIGH (ref 3.5–5.1)
Sodium: 134 mmol/L — ABNORMAL LOW (ref 135–145)
Total Bilirubin: 1.7 mg/dL — ABNORMAL HIGH (ref 0.3–1.2)
Total Protein: 6.4 g/dL — ABNORMAL LOW (ref 6.5–8.1)

## 2019-02-05 LAB — DIFFERENTIAL
Abs Immature Granulocytes: 0.04 10*3/uL (ref 0.00–0.07)
Basophils Absolute: 0 10*3/uL (ref 0.0–0.1)
Basophils Relative: 1 %
Eosinophils Absolute: 0 10*3/uL (ref 0.0–0.5)
Eosinophils Relative: 0 %
Immature Granulocytes: 1 %
Lymphocytes Relative: 6 %
Lymphs Abs: 0.5 10*3/uL — ABNORMAL LOW (ref 0.7–4.0)
Monocytes Absolute: 0.7 10*3/uL (ref 0.1–1.0)
Monocytes Relative: 9 %
Neutro Abs: 7.1 10*3/uL (ref 1.7–7.7)
Neutrophils Relative %: 83 %

## 2019-02-05 LAB — I-STAT CHEM 8, ED
BUN: 38 mg/dL — ABNORMAL HIGH (ref 8–23)
Calcium, Ion: 0.94 mmol/L — ABNORMAL LOW (ref 1.15–1.40)
Chloride: 102 mmol/L (ref 98–111)
Creatinine, Ser: 8.4 mg/dL — ABNORMAL HIGH (ref 0.61–1.24)
Glucose, Bld: 233 mg/dL — ABNORMAL HIGH (ref 70–99)
HCT: 38 % — ABNORMAL LOW (ref 39.0–52.0)
Hemoglobin: 12.9 g/dL — ABNORMAL LOW (ref 13.0–17.0)
Potassium: 5 mmol/L (ref 3.5–5.1)
Sodium: 132 mmol/L — ABNORMAL LOW (ref 135–145)
TCO2: 26 mmol/L (ref 22–32)

## 2019-02-05 LAB — CBC
HCT: 39.6 % (ref 39.0–52.0)
Hemoglobin: 12.3 g/dL — ABNORMAL LOW (ref 13.0–17.0)
MCH: 34.4 pg — ABNORMAL HIGH (ref 26.0–34.0)
MCHC: 31.1 g/dL (ref 30.0–36.0)
MCV: 110.6 fL — ABNORMAL HIGH (ref 80.0–100.0)
Platelets: 229 10*3/uL (ref 150–400)
RBC: 3.58 MIL/uL — ABNORMAL LOW (ref 4.22–5.81)
RDW: 14.3 % (ref 11.5–15.5)
WBC: 8.4 10*3/uL (ref 4.0–10.5)
nRBC: 0 % (ref 0.0–0.2)

## 2019-02-05 LAB — PROTIME-INR
INR: 1 (ref 0.8–1.2)
Prothrombin Time: 13 seconds (ref 11.4–15.2)

## 2019-02-05 LAB — POTASSIUM: Potassium: 4 mmol/L (ref 3.5–5.1)

## 2019-02-05 LAB — APTT: aPTT: 23 seconds — ABNORMAL LOW (ref 24–36)

## 2019-02-05 MED ORDER — SODIUM CHLORIDE 0.9% FLUSH: 3.0000 mL | Freq: Once | INTRAVENOUS | Status: DC

## 2019-02-05 NOTE — ED Notes (Signed)
Patient Alert and oriented to baseline. Stable and ambulatory to baseline however accepted wheelchair ride to lobby. Patient verbalized understanding of the discharge instructions.  Patient belongings were taken by the patient.

## 2019-02-05 NOTE — ED Notes (Signed)
Returned from MRI 

## 2019-02-05 NOTE — ED Provider Notes (Signed)
[facility] EMERGENCY DEPARTMENT Provider Note   CSN: [id number] Arrival date & time: 02/05/19  1502     History   Chief Complaint Chief Complaint  Patient presents with   Extremity Weakness    HPI [name] is a 68 y.o. male.     The history is provided by the patient and medical records. No language interpreter was used.  Extremity Weakness Pertinent negatives include no headaches.   [name] is a 68 y.o. male  with a complex medical hx as listed below who presents to the Emergency Department by recommendation of his vascular team up from the office for concerns of right leg weakness.  Patient states that he woke up this morning in his usual state of health.  He states around 11:00, he was at the store before his appointment and his right leg just "gave out".  He reports no other associated symptoms other than right leg weakness.  Feels a little stronger now, but still weaker than the left leg.  He had his scheduled appointment with his vascular team to look at his fistula site today and they noticed he still had persistent right leg weakness.  They recommended he come to the ER for concerns of possible stroke causing findings.  He denies any numbness.  No upper extremity weakness.  No difficulty with speech.  No headache.  No visual changes.  Past Medical History:  Diagnosis Date   Aortic valve disorders    Benign neoplasm of colon    Degeneration of intervertebral disc, site unspecified    Diabetes mellitus without complication ([place])    Diaphragmatic hernia without mention of obstruction or gangrene    Esophageal reflux    ESRD (end stage renal disease) on dialysis [facility])    Essential hypertension    Obstructive sleep apnea (adult) (pediatric)    Osteoarthrosis, unspecified whether generalized or localized, unspecified site    Other and unspecified hyperlipidemia    Pneumonia    interstitial pneumonia   Pulmonary fibrosis  ([place])    Renal disorder    Respiratory failure with hypoxia ([place]) 12/2015   Shortness of breath dyspnea    Transplanted, lung ([place])    Unspecified essential hypertension     Patient Active Problem List   Diagnosis Date Noted   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   AV fistula infection, initial encounter ([place]) 01/14/2019   Immunocompromised state ([place]) 01/14/2019   Wound infection 01/13/2019   Sepsis ([place])    CKD (chronic kidney disease)    Generalized weakness 12/30/2015   ESRD (end stage renal disease) on dialysis ([place])    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[place])    Acute respiratory failure with hypoxemia ([place]) 04/18/2015   Septic shock ([place]) 04/18/2015   Acute encephalopathy 04/18/2015   Acute respiratory failure ([place]) 04/18/2015   Cardiac arrest ([place])    HCAP (healthcare-associated pneumonia)    Elevated rheumatoid factor 05/09/2014   Essential hypertension 05/09/2014   ILD (interstitial lung disease) ([place]) 05/09/2014   Obstructive apnea 05/09/2014   Lung nodule, solitary 04/18/2014   Awaiting organ transplant 04/18/2014   Acute on chronic respiratory failure with hypoxia ([place]) 08/15/2013   Edema 07/05/2013   Chronic respiratory failure ([place]) 03/03/2013   Diabetes mellitus with complication ([place]) 65/68/1275   Acute sinusitis 05/04/2011   Cough 11/10/2010   Pulmonary fibrosis, 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   Obstructive sleep apnea 10/09/2008   ACID REFLUX DISEASE 10/09/2008   HIATAL HERNIA 10/09/2008   OSTEOARTHRITIS 10/09/2008  Past Surgical History:  Procedure Laterality Date   COLONOSCOPY WITH PROPOFOL N/A 11/07/2017   Procedure: COLONOSCOPY WITH PROPOFOL;  Surgeon: Ronnette Juniper, MD;  Location: [facility];  Service: Gastroenterology;  Laterality: N/A;   LIGATION OF ARTERIOVENOUS  FISTULA Left 01/14/2019   Procedure: LIGATION OF ARTERIOVENOUS  FISTULA;  Surgeon: Marty Heck, MD;  Location: [facility];  Service:  Vascular;  Laterality: Left;   LUNG BIOPSY  2010   LUNG TRANSPLANT, SINGLE Right    POLYPECTOMY  11/07/2017   Procedure: POLYPECTOMY;  Surgeon: Ronnette Juniper, MD;  Location: [facility];  Service: Gastroenterology;;        Home Medications    Prior to Admission medications   Medication Sig Start Date End Date Taking? Authorizing Provider  acetaminophen (TYLENOL) 500 MG tablet Take 500-1,000 mg by mouth every 6 (six) hours as needed for headache.   Yes [provider]  aspirin 81 MG tablet Take 81 mg by mouth daily.    Yes [provider]  azaTHIOprine (IMURAN) 50 MG tablet Take 25 mg by mouth See admin instructions. Take 25mg  by mouth on Monday, Wednesday, and Friday after dialysis   Yes [provider]  calcium acetate (PHOSLO) 667 MG capsule Take 1,334 mg by mouth 3 (three) times daily before meals.    Yes [provider]  cycloSPORINE (SANDIMMUNE) 25 MG capsule Take 25 mg by mouth See admin instructions. Take 100mg  by mouth in the morning and 75mg  by mouth at night   Yes [provider]  dorzolamide (TRUSOPT) 2 % ophthalmic solution Place 1 drop into both eyes 2 (two) times daily. 03/31/18  Yes [provider]  fluticasone (FLONASE) 50 MCG/ACT nasal spray Place 2 sprays into the nose daily. Patient taking differently: Place 2 sprays into the nose daily as needed for allergies.  03/05/13  Yes Wenda Low, MD  Immune Globulin 10% (PRIVIGEN) 20 GM/200ML SOLN Inject 0.5 g/kg into the vein every 3 (three) months.    Yes [provider]  insulin regular (NOVOLIN R) 100 units/mL injection Inject 5 Units into the skin 3 (three) times daily before meals. Additional units (per sliding scale): BGL 201-250 = 1 unit; 251-300 = 2 units; 301-350 = 3 units; 351-400 = units; >401 = 5 units + CALL LUNG COORDINATOR @ [facility]   Yes [provider]  Multiple Vitamin (MULTIVITAMIN) capsule Take 1 capsule by mouth daily.    Yes [provider]  omeprazole (PRILOSEC) 40 MG capsule Take 40 mg by mouth 3 (three) times daily with meals.  11/12/18  Yes [provider]  polyvinyl alcohol-povidone (REFRESH) 1.4-0.6 % ophthalmic solution Place 1-2 drops into both eyes daily as needed (for dryness).   Yes [provider]  pravastatin (PRAVACHOL) 20 MG tablet Take 20 mg by mouth at bedtime.    Yes [provider]  sulfamethoxazole-trimethoprim (BACTRIM,SEPTRA) 400-80 MG tablet Take 1 tablet by mouth See admin instructions. Take one tablet by mouth every Monday, Wednesday, and Friday after dialysis 02/27/17  Yes [provider]  valGANciclovir (VALCYTE) 450 MG tablet Take 1 tablet by mouth See admin instructions. Take 1 tablet by mouth every Monday and Friday  After dialysis 03/26/15  Yes [provider]  [place] As directed up to 4 times daily 12/16/15   [provider]    Family History Family History  Problem Relation Age of Onset   Pancreatic cancer Brother    Heart disease Father  Social History Social History   Tobacco Use   Smoking status: Former Smoker    Packs/day: 0.30    Years: 20.00    Pack years: 6.00    Types: Cigarettes    Quit date: 06/06/2002    Years since quitting: 16.6   Smokeless tobacco: Never Used  Substance Use Topics   Alcohol use: No    Alcohol/week: 0.0 standard drinks   Drug use: No     Allergies   Levofloxacin and Nsaids   Review of Systems Review of Systems  Musculoskeletal: Positive for extremity weakness.  Neurological: Positive for weakness. Negative for dizziness, syncope, speech difficulty, numbness and headaches.  All other systems reviewed and are negative.    Physical Exam Updated Vital Signs BP 124/72 (BP Location: Right Arm)    Pulse 70    Temp 97.8 F (36.6 C) (Oral)    Resp 16    SpO2 98%   Physical Exam Vitals signs and nursing note reviewed.  Constitutional:      General: He  is not in acute distress.    Appearance: He is well-developed.  HENT:     Head: Normocephalic and atraumatic.  Neck:     Musculoskeletal: Neck supple.  Cardiovascular:     Rate and Rhythm: Normal rate and regular rhythm.     Heart sounds: Normal heart sounds. No murmur.  Pulmonary:     Effort: Pulmonary effort is normal. No respiratory distress.     Breath sounds: Normal breath sounds.  Abdominal:     General: There is no distension.     Palpations: Abdomen is soft.     Tenderness: There is no abdominal tenderness.  Musculoskeletal:     Comments: 5/5 muscle strength in the left lower extremity and bilateral upper extremities.  4/5 muscle strength in the right lower extremity.  Bilateral lower extremities are nontender.  Does have tenderness to palpation across the low back including midline.  Skin:    General: Skin is warm and dry.  Neurological:     Mental Status: He is alert and oriented to person, place, and time.     Comments: Alert, oriented, thought content appropriate, able to give a coherent history. Speech is clear and goal oriented, able to follow commands.  Cranial Nerves:  II:  Peripheral visual fields grossly normal, pupils equal, round, reactive to light III, IV, VI: EOM intact bilaterally, ptosis not present V,VII: smile symmetric, eyes kept closed tightly against resistance, facial light touch sensation equal VIII: hearing grossly normal IX, X: symmetric soft palate movement, uvula elevates symmetrically  XI: bilateral shoulder shrug symmetric and strong XII: midline tongue extension Sensory to light touch normal in all four extremities.       ED Treatments / Results  Labs (all labs ordered are listed, but only abnormal results are displayed) Labs Reviewed  APTT - Abnormal; Notable for the following components:      Result Value   aPTT 23 (*)    All other components within normal limits  CBC - Abnormal; Notable for the following components:   RBC 3.58 (*)     Hemoglobin 12.3 (*)    MCV 110.6 (*)    MCH 34.4 (*)    All other components within normal limits  DIFFERENTIAL - Abnormal; Notable for the following components:   Lymphs Abs 0.5 (*)    All other components within normal limits  COMPREHENSIVE METABOLIC PANEL - Abnormal; Notable for the following components:   Sodium 134 (*)  Potassium 5.3 (*)    Chloride 97 (*)    CO2 20 (*)    Glucose, Bld 227 (*)    BUN 34 (*)    Creatinine, Ser 8.11 (*)    Total Protein 6.4 (*)    Albumin 3.2 (*)    AST 53 (*)    Alkaline Phosphatase 279 (*)    Total Bilirubin 1.7 (*)    GFR calc non Af Amer 6 (*)    GFR calc Af Amer 7 (*)    Anion gap 17 (*)    All other components within normal limits  I-STAT CHEM 8, ED - Abnormal; Notable for the following components:   Sodium 132 (*)    BUN 38 (*)    Creatinine, Ser 8.40 (*)    Glucose, Bld 233 (*)    Calcium, Ion 0.94 (*)    Hemoglobin 12.9 (*)    HCT 38.0 (*)    All other components within normal limits  PROTIME-INR  POTASSIUM    EKG None  Radiology Ct Head Wo Contrast  Result Date: 02/05/2019 CLINICAL DATA:  Right leg weakness, leg gave out while walking. EXAM: CT HEAD WITHOUT CONTRAST TECHNIQUE: Contiguous axial images were obtained from the base of the skull through the vertex without intravenous contrast. COMPARISON:  CT 04/22/2018 FINDINGS: Brain: No intracranial hemorrhage, mass effect, or midline shift. Brain volume is normal for age. Remote lacunar infarct versus prominent perivascular space in the subinsular region on the right. No hydrocephalus. The basilar cisterns are patent. No evidence of territorial infarct or acute ischemia. No extra-axial or intracranial fluid collection. Vascular: Atherosclerosis of skullbase vasculature without hyperdense vessel or abnormal calcification. Skull: No fracture or focal lesion. Sinuses/Orbits: Paranasal sinuses are clear. Mastoid air cells hypo pneumatized, unchanged. The visualized orbits are  unremarkable. Bilateral cataract resection. Other: None. IMPRESSION: No acute intracranial abnormality. Electronically Signed   By: Keith Rake M.D.   On: 02/05/2019 20:18   Mr Brain Wo Contrast  Result Date: 02/05/2019 CLINICAL DATA:  Initial evaluation for acute right lower extremity weakness. EXAM: MRI HEAD WITHOUT CONTRAST TECHNIQUE: Multiplanar, multiecho pulse sequences of the brain and surrounding structures were obtained without intravenous contrast. COMPARISON:  None. FINDINGS: Brain: Mild age-related cerebral atrophy. Patchy and confluent T2/FLAIR hyperintensity within the periventricular and deep white matter both cerebral hemispheres most consistent with chronic small vessel ischemic disease, mild in nature. Chronic microvascular ischemic changes present within the pons as well. Superimposed tiny remote lacunar infarct present at the left caudate. No abnormal foci of restricted diffusion to suggest acute or subacute ischemia. Gray-white matter differentiation well maintained. No encephalomalacia to suggest chronic infarction. No foci of susceptibility artifact to suggest acute or chronic intracranial hemorrhage. No mass lesion, midline shift or mass effect. No hydrocephalus. No extra-axial fluid collection. Major dural sinuses are grossly patent. Pituitary gland and suprasellar region are normal. Midline structures intact and normal. Vascular: Major intracranial vascular flow voids well maintained and normal in appearance. Hypoplastic right vertebral artery noted. Skull and upper cervical spine: Craniocervical junction normal. Visualized upper cervical spine within normal limits. Bone marrow signal intensity normal. No scalp soft tissue abnormality. Sinuses/Orbits: Patient status post bilateral ocular lens replacement. Paranasal sinuses are largely clear. Bilateral mastoid effusions noted, likely benign/sterile. Inner ear structures grossly normal. Other: None. IMPRESSION: 1. No acute  intracranial infarct or other abnormality identified. 2. Mild age-related cerebral atrophy with chronic microvascular ischemic disease. Superimposed tiny remote lacunar infarct at the left caudate. Electronically Signed  By: Jeannine Boga M.D.   On: 02/05/2019 22:53   Mr Lumbar Spine Wo Contrast  Result Date: 02/05/2019 CLINICAL DATA:  Initial evaluation for acute right lower extremity weakness. EXAM: MRI LUMBAR SPINE WITHOUT CONTRAST TECHNIQUE: Multiplanar, multisequence MR imaging of the lumbar spine was performed. No intravenous contrast was administered. COMPARISON:  None. FINDINGS: Segmentation: Transitional lumbosacral anatomy with partial sacralization of the L5 vertebral body. L5-S1 disc somewhat rudimentary. Alignment: Physiologic with preservation of the normal lumbar lordosis. No listhesis. Vertebrae: Vertebral body height maintained without evidence for acute or chronic fracture. Bone marrow signal intensity within normal limits. No discrete or worrisome osseous lesions. No abnormal marrow edema. Conus medullaris and cauda equina: Conus extends to the L1 level. Conus and cauda equina appear normal. Paraspinal and other soft tissues: Paraspinous soft tissues demonstrate no acute finding. Partially visualized kidneys are somewhat atrophic bilaterally. Tiny sub subcu T2 hyperintense simple left renal cyst noted. Atherosclerotic change noted within the visualized aorta. Disc levels: T11-12: Seen only on sagittal projection. Diffuse disc bulge with disc desiccation and intervertebral disc space narrowing. Bilateral facet hypertrophy. No significant spinal stenosis. Mild bilateral foraminal narrowing. T12-L1: Unremarkable. L1-2:  Unremarkable. L2-3: Diffuse disc bulge with disc desiccation and intervertebral disc space narrowing. Mild reactive endplate changes. Superimposed right subarticular disc extrusion extends into the right lateral recess (series 12, image 9). Slight superior and inferior  migration of disc material. Superimposed mild bilateral facet hypertrophy. Resultant mild canal with moderate right lateral recess stenosis, potentially affecting the descending right L3 nerve root. Mild bilateral foraminal narrowing. L3-4: Diffuse disc bulge with disc desiccation and intervertebral disc space narrowing. Moderate facet and ligament flavum hypertrophy. Resultant mild spinal stenosis. Mild to moderate bilateral L3 foraminal narrowing. L4-5: Diffuse disc bulge with disc desiccation and intervertebral disc space narrowing. Superimposed right foraminal/extraforaminal disc extrusion with superior migration (series 12, image 19). Extruded disc material contacts and impinges upon the exiting right L4 nerve root as it courses of the right lateral recess (series 11, image 2). Superimposed moderate facet and ligament flavum hypertrophy. Mild canal with mild to moderate right greater than left foraminal stenosis. L5-S1: Transitional lumbosacral anatomy with rudimentary L5-S1 disc. No disc bulge or disc protrusion. Minimal facet degeneration. No canal or neural foraminal stenosis. IMPRESSION: 1. Right foraminal/extraforaminal disc extrusion at L4-5, impinging upon the exiting right L4 nerve root as it courses of the right neural foramen. 2. Broad right subarticular disc extrusion at L2-3 with resultant moderate right lateral recess stenosis, and potentially affecting the descending right L3 nerve root. 3. Disc bulging with facet hypertrophy at L3-4 and L4-5 with underlying mild canal, with mild to moderate bilateral foraminal stenosis. Electronically Signed   By: Jeannine Boga M.D.   On: 02/05/2019 23:02    Procedures Procedures (including critical care time)  Medications Ordered in ED Medications  sodium chloride flush (NS) 0.9 % injection 3 mL (has no administration in time range)     Initial Impression / Assessment and Plan / ED Course  I have reviewed the triage vital signs and the  nursing notes.  Pertinent labs & imaging results that were available during my care of the patient were reviewed by me and considered in my medical decision making (see chart for details).       [name] is a 68 y.o. male who presents to ED by recommendation of vascular clinic today for further evaluation of right leg weakness which began today around 11am while walking. He does have 4/5  muscle strength of the RLE compared to 5/5 in LLE and bilateral UE's. Denies any sensory deficits. Does report hx of DDD and has tenderness to the low back. Stroke on differential, but weakness could be 2/2 spine as well.  Labs baseline. First K was 5.3 but repeat was normal at 4.0. First K likely hemolyzed. CT head negative. Discussed case with Dr. Lorraine Lax of neurology who recommends MR of brain and L-spine.   MRI brain without acute findings.  MR L spine reviewed with attending. Shows right foraminal / extraforaminal disc extrusion at L4-5, impinging upon exiting R L4 nerve root; right subarticular disc extrusion at L2-3 with resultant lateral right lateral recess stenosis potentially affecting the descending right L3 nerve root; disc bulging with facet hypertrophy at L3-L4 and L4-L5 with underlying mild canal, with mild to moderate bilateral foraminal stenosis.  Discussed case with neurosurgery, PA Costella, who has reviewed imaging.  States that as long as patient is able to ambulate safely and is not in significant amount of discomfort, safe for outpatient follow-up and to call the clinic in the morning to schedule.  Patient is ambulatory in ED. Feels comfortable with this plan. Return precautions were discussed and all questions were answered.  Patient discussed with Dr. Regenia Skeeter who agrees with treatment plan.   Final Clinical Impressions(s) / ED Diagnoses   Final diagnoses:  Right leg weakness    ED Discharge Orders    None       Keilani Terrance, Ozella Almond, PA-C 02/05/19 [id number]    Sherwood Gambler, MD 02/08/19 1753

## 2019-02-05 NOTE — ED Triage Notes (Addendum)
To ED for eval after having his right leg 'give out' this am while walking. States this happened about a year ago as well. Pt ambulating with a cane today - states weakness has improved. Told to come to ED for eval of possible stroke. Pt right leg noted to not be as strong as left but no sensation difference. No further neuro deficits.

## 2019-02-05 NOTE — Discharge Instructions (Signed)
It was my pleasure taking care of you today!   Call the neurosurgery clinic tomorrow to schedule a follow up appointment.   Return to ER for new or worsening symptoms, any additional concerns.

## 2019-02-05 NOTE — ED Notes (Signed)
Pt able to dress himself and ambulate with no difficulties.

## 2019-02-05 NOTE — ED Notes (Signed)
Patient transported to CT 

## 2019-02-05 NOTE — ED Notes (Signed)
Patient transported to MRI 

## 2019-02-06 DIAGNOSIS — D631 Anemia in chronic kidney disease: Secondary | ICD-10-CM | POA: Diagnosis not present

## 2019-02-06 DIAGNOSIS — Z992 Dependence on renal dialysis: Secondary | ICD-10-CM | POA: Diagnosis not present

## 2019-02-06 DIAGNOSIS — N186 End stage renal disease: Secondary | ICD-10-CM | POA: Diagnosis not present

## 2019-02-06 DIAGNOSIS — T8579XA Infection and inflammatory reaction due to other internal prosthetic devices, implants and grafts, initial encounter: Secondary | ICD-10-CM | POA: Diagnosis not present

## 2019-02-06 DIAGNOSIS — N2581 Secondary hyperparathyroidism of renal origin: Secondary | ICD-10-CM | POA: Diagnosis not present

## 2019-02-06 DIAGNOSIS — E876 Hypokalemia: Secondary | ICD-10-CM | POA: Diagnosis not present

## 2019-02-08 DIAGNOSIS — E876 Hypokalemia: Secondary | ICD-10-CM | POA: Diagnosis not present

## 2019-02-08 DIAGNOSIS — D631 Anemia in chronic kidney disease: Secondary | ICD-10-CM | POA: Diagnosis not present

## 2019-02-08 DIAGNOSIS — N186 End stage renal disease: Secondary | ICD-10-CM | POA: Diagnosis not present

## 2019-02-08 DIAGNOSIS — N2581 Secondary hyperparathyroidism of renal origin: Secondary | ICD-10-CM | POA: Diagnosis not present

## 2019-02-08 DIAGNOSIS — Z992 Dependence on renal dialysis: Secondary | ICD-10-CM | POA: Diagnosis not present

## 2019-02-08 DIAGNOSIS — T8579XA Infection and inflammatory reaction due to other internal prosthetic devices, implants and grafts, initial encounter: Secondary | ICD-10-CM | POA: Diagnosis not present

## 2019-02-11 DIAGNOSIS — N186 End stage renal disease: Secondary | ICD-10-CM | POA: Diagnosis not present

## 2019-02-11 DIAGNOSIS — N2581 Secondary hyperparathyroidism of renal origin: Secondary | ICD-10-CM | POA: Diagnosis not present

## 2019-02-11 DIAGNOSIS — D631 Anemia in chronic kidney disease: Secondary | ICD-10-CM | POA: Diagnosis not present

## 2019-02-11 DIAGNOSIS — E876 Hypokalemia: Secondary | ICD-10-CM | POA: Diagnosis not present

## 2019-02-11 DIAGNOSIS — T8579XA Infection and inflammatory reaction due to other internal prosthetic devices, implants and grafts, initial encounter: Secondary | ICD-10-CM | POA: Diagnosis not present

## 2019-02-11 DIAGNOSIS — Z992 Dependence on renal dialysis: Secondary | ICD-10-CM | POA: Diagnosis not present

## 2019-02-12 DIAGNOSIS — R760 Raised antibody titer: Secondary | ICD-10-CM | POA: Diagnosis not present

## 2019-02-12 DIAGNOSIS — M5126 Other intervertebral disc displacement, lumbar region: Secondary | ICD-10-CM | POA: Diagnosis not present

## 2019-02-12 DIAGNOSIS — R0602 Shortness of breath: Secondary | ICD-10-CM | POA: Diagnosis not present

## 2019-02-12 DIAGNOSIS — Z4824 Encounter for aftercare following lung transplant: Secondary | ICD-10-CM | POA: Diagnosis not present

## 2019-02-12 DIAGNOSIS — A419 Sepsis, unspecified organism: Secondary | ICD-10-CM | POA: Diagnosis not present

## 2019-02-12 DIAGNOSIS — G629 Polyneuropathy, unspecified: Secondary | ICD-10-CM | POA: Diagnosis not present

## 2019-02-12 DIAGNOSIS — Z8711 Personal history of peptic ulcer disease: Secondary | ICD-10-CM | POA: Diagnosis not present

## 2019-02-12 DIAGNOSIS — R251 Tremor, unspecified: Secondary | ICD-10-CM | POA: Diagnosis not present

## 2019-02-12 DIAGNOSIS — I959 Hypotension, unspecified: Secondary | ICD-10-CM | POA: Diagnosis not present

## 2019-02-12 DIAGNOSIS — B259 Cytomegaloviral disease, unspecified: Secondary | ICD-10-CM | POA: Diagnosis not present

## 2019-02-12 DIAGNOSIS — Z5181 Encounter for therapeutic drug level monitoring: Secondary | ICD-10-CM | POA: Diagnosis not present

## 2019-02-12 DIAGNOSIS — N179 Acute kidney failure, unspecified: Secondary | ICD-10-CM | POA: Diagnosis not present

## 2019-02-12 DIAGNOSIS — Z992 Dependence on renal dialysis: Secondary | ICD-10-CM | POA: Diagnosis not present

## 2019-02-12 DIAGNOSIS — D899 Disorder involving the immune mechanism, unspecified: Secondary | ICD-10-CM | POA: Diagnosis not present

## 2019-02-12 DIAGNOSIS — M48061 Spinal stenosis, lumbar region without neurogenic claudication: Secondary | ICD-10-CM | POA: Diagnosis not present

## 2019-02-12 DIAGNOSIS — R55 Syncope and collapse: Secondary | ICD-10-CM | POA: Diagnosis not present

## 2019-02-12 DIAGNOSIS — R0689 Other abnormalities of breathing: Secondary | ICD-10-CM | POA: Diagnosis not present

## 2019-02-12 DIAGNOSIS — R0781 Pleurodynia: Secondary | ICD-10-CM | POA: Diagnosis not present

## 2019-02-12 DIAGNOSIS — Z942 Lung transplant status: Secondary | ICD-10-CM | POA: Diagnosis not present

## 2019-02-12 DIAGNOSIS — R942 Abnormal results of pulmonary function studies: Secondary | ICD-10-CM | POA: Diagnosis not present

## 2019-02-12 DIAGNOSIS — N186 End stage renal disease: Secondary | ICD-10-CM | POA: Diagnosis not present

## 2019-02-12 DIAGNOSIS — Z6827 Body mass index (BMI) 27.0-27.9, adult: Secondary | ICD-10-CM | POA: Diagnosis not present

## 2019-02-13 DIAGNOSIS — T8579XA Infection and inflammatory reaction due to other internal prosthetic devices, implants and grafts, initial encounter: Secondary | ICD-10-CM | POA: Diagnosis not present

## 2019-02-13 DIAGNOSIS — D631 Anemia in chronic kidney disease: Secondary | ICD-10-CM | POA: Diagnosis not present

## 2019-02-13 DIAGNOSIS — N186 End stage renal disease: Secondary | ICD-10-CM | POA: Diagnosis not present

## 2019-02-13 DIAGNOSIS — N2581 Secondary hyperparathyroidism of renal origin: Secondary | ICD-10-CM | POA: Diagnosis not present

## 2019-02-13 DIAGNOSIS — Z992 Dependence on renal dialysis: Secondary | ICD-10-CM | POA: Diagnosis not present

## 2019-02-13 DIAGNOSIS — E876 Hypokalemia: Secondary | ICD-10-CM | POA: Diagnosis not present

## 2019-02-15 DIAGNOSIS — Z992 Dependence on renal dialysis: Secondary | ICD-10-CM | POA: Diagnosis not present

## 2019-02-15 DIAGNOSIS — N186 End stage renal disease: Secondary | ICD-10-CM | POA: Diagnosis not present

## 2019-02-15 DIAGNOSIS — T8579XA Infection and inflammatory reaction due to other internal prosthetic devices, implants and grafts, initial encounter: Secondary | ICD-10-CM | POA: Diagnosis not present

## 2019-02-15 DIAGNOSIS — N2581 Secondary hyperparathyroidism of renal origin: Secondary | ICD-10-CM | POA: Diagnosis not present

## 2019-02-15 DIAGNOSIS — E876 Hypokalemia: Secondary | ICD-10-CM | POA: Diagnosis not present

## 2019-02-15 DIAGNOSIS — D631 Anemia in chronic kidney disease: Secondary | ICD-10-CM | POA: Diagnosis not present

## 2019-02-18 DIAGNOSIS — T8579XA Infection and inflammatory reaction due to other internal prosthetic devices, implants and grafts, initial encounter: Secondary | ICD-10-CM | POA: Diagnosis not present

## 2019-02-18 DIAGNOSIS — N2581 Secondary hyperparathyroidism of renal origin: Secondary | ICD-10-CM | POA: Diagnosis not present

## 2019-02-18 DIAGNOSIS — Z992 Dependence on renal dialysis: Secondary | ICD-10-CM | POA: Diagnosis not present

## 2019-02-18 DIAGNOSIS — N186 End stage renal disease: Secondary | ICD-10-CM | POA: Diagnosis not present

## 2019-02-18 DIAGNOSIS — E876 Hypokalemia: Secondary | ICD-10-CM | POA: Diagnosis not present

## 2019-02-18 DIAGNOSIS — D631 Anemia in chronic kidney disease: Secondary | ICD-10-CM | POA: Diagnosis not present

## 2019-02-19 DIAGNOSIS — N400 Enlarged prostate without lower urinary tract symptoms: Secondary | ICD-10-CM | POA: Diagnosis not present

## 2019-02-19 DIAGNOSIS — Z942 Lung transplant status: Secondary | ICD-10-CM | POA: Diagnosis not present

## 2019-02-19 DIAGNOSIS — R972 Elevated prostate specific antigen [PSA]: Secondary | ICD-10-CM | POA: Diagnosis not present

## 2019-02-19 DIAGNOSIS — Z23 Encounter for immunization: Secondary | ICD-10-CM | POA: Diagnosis not present

## 2019-02-20 DIAGNOSIS — N2581 Secondary hyperparathyroidism of renal origin: Secondary | ICD-10-CM | POA: Diagnosis not present

## 2019-02-20 DIAGNOSIS — E876 Hypokalemia: Secondary | ICD-10-CM | POA: Diagnosis not present

## 2019-02-20 DIAGNOSIS — T8579XA Infection and inflammatory reaction due to other internal prosthetic devices, implants and grafts, initial encounter: Secondary | ICD-10-CM | POA: Diagnosis not present

## 2019-02-20 DIAGNOSIS — N186 End stage renal disease: Secondary | ICD-10-CM | POA: Diagnosis not present

## 2019-02-20 DIAGNOSIS — D631 Anemia in chronic kidney disease: Secondary | ICD-10-CM | POA: Diagnosis not present

## 2019-02-20 DIAGNOSIS — Z992 Dependence on renal dialysis: Secondary | ICD-10-CM | POA: Diagnosis not present

## 2019-02-22 DIAGNOSIS — N186 End stage renal disease: Secondary | ICD-10-CM | POA: Diagnosis not present

## 2019-02-22 DIAGNOSIS — E876 Hypokalemia: Secondary | ICD-10-CM | POA: Diagnosis not present

## 2019-02-22 DIAGNOSIS — Z992 Dependence on renal dialysis: Secondary | ICD-10-CM | POA: Diagnosis not present

## 2019-02-22 DIAGNOSIS — T8579XA Infection and inflammatory reaction due to other internal prosthetic devices, implants and grafts, initial encounter: Secondary | ICD-10-CM | POA: Diagnosis not present

## 2019-02-22 DIAGNOSIS — N2581 Secondary hyperparathyroidism of renal origin: Secondary | ICD-10-CM | POA: Diagnosis not present

## 2019-02-22 DIAGNOSIS — D631 Anemia in chronic kidney disease: Secondary | ICD-10-CM | POA: Diagnosis not present

## 2019-02-25 DIAGNOSIS — E876 Hypokalemia: Secondary | ICD-10-CM | POA: Diagnosis not present

## 2019-02-25 DIAGNOSIS — T8579XA Infection and inflammatory reaction due to other internal prosthetic devices, implants and grafts, initial encounter: Secondary | ICD-10-CM | POA: Diagnosis not present

## 2019-02-25 DIAGNOSIS — N2581 Secondary hyperparathyroidism of renal origin: Secondary | ICD-10-CM | POA: Diagnosis not present

## 2019-02-25 DIAGNOSIS — Z992 Dependence on renal dialysis: Secondary | ICD-10-CM | POA: Diagnosis not present

## 2019-02-25 DIAGNOSIS — N186 End stage renal disease: Secondary | ICD-10-CM | POA: Diagnosis not present

## 2019-02-25 DIAGNOSIS — D631 Anemia in chronic kidney disease: Secondary | ICD-10-CM | POA: Diagnosis not present

## 2019-02-26 ENCOUNTER — Other Ambulatory Visit: Payer: Self-pay

## 2019-02-26 ENCOUNTER — Encounter: Payer: Self-pay | Admitting: Vascular Surgery

## 2019-02-26 ENCOUNTER — Ambulatory Visit (INDEPENDENT_AMBULATORY_CARE_PROVIDER_SITE_OTHER): Payer: Self-pay | Admitting: Vascular Surgery

## 2019-02-26 VITALS — BP 147/87 | HR 80 | Temp 97.6°F | Resp 16 | Ht 63.5 in | Wt 150.0 lb

## 2019-02-26 DIAGNOSIS — N186 End stage renal disease: Secondary | ICD-10-CM

## 2019-02-26 DIAGNOSIS — Z992 Dependence on renal dialysis: Secondary | ICD-10-CM

## 2019-02-26 NOTE — Progress Notes (Signed)
Patient name: [name] MRN: [id number] DOB: 1950/08/01 Sex: male  REASON FOR VISIT: Follow-up after left arm AV fistula revision  HPI: [name] is a 68 y.o. male with multiple medical problems including end-stage renal disease who presents for follow-up after left arm AV fistula revision.  Patient underwent left arm AV fistula revision with plication on 7/62/2633.  He had an ulcerated scab.  He was previously seen by PA and then scheduled for further follow-up with me today.  They have been accessing the fistula away from the revision and states it has been working fine.  No further bleeding events.  No new fistula ulcerations.  Past Medical History:  Diagnosis Date  . Aortic valve disorders   . Benign neoplasm of colon   . Degeneration of intervertebral disc, site unspecified   . Diabetes mellitus without complication ([place])   . 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 Esophageal reflux   . ESRD (end stage renal disease) on dialysis ([place])   . Essential hypertension   . Obstructive sleep apnea (adult) (pediatric)   . Osteoarthrosis, unspecified whether generalized or localized, unspecified site   . Other and unspecified hyperlipidemia   . Pneumonia    interstitial pneumonia  . Pulmonary fibrosis ([place])   . Renal disorder   . Respiratory failure with hypoxia ([place]) 12/2015  . Shortness of breath dyspnea   . Transplanted, lung ([place])   . Unspecified essential hypertension     Past Surgical History:  Procedure Laterality Date  . COLONOSCOPY WITH PROPOFOL N/A 11/07/2017   Procedure: COLONOSCOPY WITH PROPOFOL;  Surgeon: Ronnette Juniper, MD;  Location: [facility];  Service: Gastroenterology;  Laterality: N/A;  . LIGATION OF ARTERIOVENOUS  FISTULA Left 01/14/2019   Procedure: LIGATION OF ARTERIOVENOUS  FISTULA;  Surgeon: Marty Heck, MD;  Location: [facility];  Service: Vascular;  Laterality: Left;  . LUNG BIOPSY  2010  . LUNG TRANSPLANT,  SINGLE Right   . POLYPECTOMY  11/07/2017   Procedure: POLYPECTOMY;  Surgeon: Ronnette Juniper, MD;  Location: [facility];  Service: Gastroenterology;;    Family History  Problem Relation Age of Onset  . Pancreatic cancer Brother   . Heart disease Father     SOCIAL HISTORY: Social History   Tobacco Use  . Smoking status: Former Smoker    Packs/day: 0.30    Years: 20.00    Pack years: 6.00    Types: Cigarettes    Quit date: 06/06/2002    Years since quitting: 16.7  . Smokeless tobacco: Never Used  Substance Use Topics  . Alcohol use: No    Alcohol/week: 0.0 standard drinks    Allergies  Allergen Reactions  . Levofloxacin Other (See Comments)    LOSS OF CONSCIOUSNESS  . Nsaids Other (See Comments)    Patient instructed not to take NSAID's after his lung transplant    Current Outpatient Medications  Medication Sig Dispense Refill  . ACCU-CHEK FASTCLIX LANCETS MISC As directed up to 4 times daily  3  . acetaminophen (TYLENOL) 500 MG tablet Take 500-1,000 mg by mouth every 6 (six) hours as needed for headache.    Marland Kitchen aspirin 81 MG tablet Take 81 mg by mouth daily.     Marland Kitchen azaTHIOprine (IMURAN) 50 MG tablet Take 25 mg by mouth See admin instructions. Take 25mg  by mouth on Monday, Wednesday, and Friday after dialysis    . calcium acetate (PHOSLO) 667 MG capsule Take 1,334 mg by mouth 3 (three)  times daily before meals.     . cycloSPORINE (SANDIMMUNE) 25 MG capsule Take 25 mg by mouth See admin instructions. Take 100mg  by mouth in the morning and 75mg  by mouth at night    . dorzolamide (TRUSOPT) 2 % ophthalmic solution Place 1 drop into both eyes 2 (two) times daily.  3  . fluticasone (FLONASE) 50 MCG/ACT nasal spray Place 2 sprays into the nose daily. (Patient taking differently: Place 2 sprays into the nose daily as needed for allergies. ) 30 g 2  . Immune Globulin 10% (PRIVIGEN) 20 GM/200ML SOLN Inject 0.5 g/kg into the vein every 3 (three) months.     . insulin regular (NOVOLIN R) 100  units/mL injection Inject 5 Units into the skin 3 (three) times daily before meals. Additional units (per sliding scale): BGL 201-250 = 1 unit; 251-300 = 2 units; 301-350 = 3 units; 351-400 = units; >401 = 5 units + CALL LUNG COORDINATOR @ [facility]    . Multiple Vitamin (MULTIVITAMIN) capsule Take 1 capsule by mouth daily.     Marland Kitchen omeprazole (PRILOSEC) 40 MG capsule Take 40 mg by mouth 3 (three) times daily with meals.     . polyvinyl alcohol-povidone (REFRESH) 1.4-0.6 % ophthalmic solution Place 1-2 drops into both eyes daily as needed (for dryness).    . pravastatin (PRAVACHOL) 20 MG tablet Take 20 mg by mouth at bedtime.     . sulfamethoxazole-trimethoprim (BACTRIM,SEPTRA) 400-80 MG tablet Take 1 tablet by mouth See admin instructions. Take one tablet by mouth every Monday, Wednesday, and Friday after dialysis    . valGANciclovir (VALCYTE) 450 MG tablet Take 1 tablet by mouth See admin instructions. Take 1 tablet by mouth every Monday and Friday  After dialysis     No current facility-administered medications for this visit.     REVIEW OF SYSTEMS:  [X]  denotes positive finding, [ ]  denotes negative finding Cardiac  Comments:  Chest pain or chest pressure:    Shortness of breath upon exertion:    Short of breath when lying flat:    Irregular heart rhythm:        Vascular    Pain in calf, thigh, or hip brought on by ambulation:    Pain in feet at night that wakes you up from your sleep:     Blood clot in your veins:    Leg swelling:         Pulmonary    Oxygen at home:    Productive cough:     Wheezing:         Neurologic    Sudden weakness in arms or legs:     Sudden numbness in arms or legs:     Sudden onset of difficulty speaking or slurred speech:    Temporary loss of vision in one eye:     Problems with dizziness:         Gastrointestinal    Blood in stool:     Vomited blood:         Genitourinary    Burning when urinating:     Blood in urine:        Psychiatric    Major  depression:         Hematologic    Bleeding problems:    Problems with blood clotting too easily:        Skin    Rashes or ulcers:        Constitutional    Fever or chills:  PHYSICAL EXAM: Vitals:   02/26/19 1118  BP: (!) 147/87  Pulse: 80  Resp: 16  Temp: 97.6 F (36.4 C)  TempSrc: Temporal  SpO2: 95%  Weight: 150 lb (68 kg)  Height: 5' 3.5" (1.613 m)    GENERAL: The patient is a well-nourished male, in no acute distress. The vital signs are documented above. CARDIAC: There is a regular rate and rhythm.  VASCULAR:  Fistula incision healing, scab laterally healed, no new tissue loss or ulceration over fistula, remains aneurysmal, good  thrill  DATA:   None  Assessment/Plan:  68 year old male status post left arm AV fistula revision with plication.  This is healing without issue.  I think they can start accessing the fistula throughout its course.  He has an appreciable thrill.  The fistula is aneurysmal still but should still be able to use it.  Follow-up PRN.   Marty Heck, MD [facility] Office: [phone] Pager: [place]

## 2019-02-27 DIAGNOSIS — D631 Anemia in chronic kidney disease: Secondary | ICD-10-CM | POA: Diagnosis not present

## 2019-02-27 DIAGNOSIS — T8579XA Infection and inflammatory reaction due to other internal prosthetic devices, implants and grafts, initial encounter: Secondary | ICD-10-CM | POA: Diagnosis not present

## 2019-02-27 DIAGNOSIS — E876 Hypokalemia: Secondary | ICD-10-CM | POA: Diagnosis not present

## 2019-02-27 DIAGNOSIS — Z992 Dependence on renal dialysis: Secondary | ICD-10-CM | POA: Diagnosis not present

## 2019-02-27 DIAGNOSIS — N186 End stage renal disease: Secondary | ICD-10-CM | POA: Diagnosis not present

## 2019-02-27 DIAGNOSIS — N2581 Secondary hyperparathyroidism of renal origin: Secondary | ICD-10-CM | POA: Diagnosis not present

## 2019-03-01 DIAGNOSIS — T8579XA Infection and inflammatory reaction due to other internal prosthetic devices, implants and grafts, initial encounter: Secondary | ICD-10-CM | POA: Diagnosis not present

## 2019-03-01 DIAGNOSIS — N2581 Secondary hyperparathyroidism of renal origin: Secondary | ICD-10-CM | POA: Diagnosis not present

## 2019-03-01 DIAGNOSIS — E876 Hypokalemia: Secondary | ICD-10-CM | POA: Diagnosis not present

## 2019-03-01 DIAGNOSIS — Z992 Dependence on renal dialysis: Secondary | ICD-10-CM | POA: Diagnosis not present

## 2019-03-01 DIAGNOSIS — D631 Anemia in chronic kidney disease: Secondary | ICD-10-CM | POA: Diagnosis not present

## 2019-03-01 DIAGNOSIS — N186 End stage renal disease: Secondary | ICD-10-CM | POA: Diagnosis not present

## 2019-03-04 DIAGNOSIS — N2581 Secondary hyperparathyroidism of renal origin: Secondary | ICD-10-CM | POA: Diagnosis not present

## 2019-03-04 DIAGNOSIS — D631 Anemia in chronic kidney disease: Secondary | ICD-10-CM | POA: Diagnosis not present

## 2019-03-04 DIAGNOSIS — Z992 Dependence on renal dialysis: Secondary | ICD-10-CM | POA: Diagnosis not present

## 2019-03-04 DIAGNOSIS — T8579XA Infection and inflammatory reaction due to other internal prosthetic devices, implants and grafts, initial encounter: Secondary | ICD-10-CM | POA: Diagnosis not present

## 2019-03-04 DIAGNOSIS — N186 End stage renal disease: Secondary | ICD-10-CM | POA: Diagnosis not present

## 2019-03-04 DIAGNOSIS — E876 Hypokalemia: Secondary | ICD-10-CM | POA: Diagnosis not present

## 2019-03-06 DIAGNOSIS — T8579XA Infection and inflammatory reaction due to other internal prosthetic devices, implants and grafts, initial encounter: Secondary | ICD-10-CM | POA: Diagnosis not present

## 2019-03-06 DIAGNOSIS — N2581 Secondary hyperparathyroidism of renal origin: Secondary | ICD-10-CM | POA: Diagnosis not present

## 2019-03-06 DIAGNOSIS — D631 Anemia in chronic kidney disease: Secondary | ICD-10-CM | POA: Diagnosis not present

## 2019-03-06 DIAGNOSIS — N186 End stage renal disease: Secondary | ICD-10-CM | POA: Diagnosis not present

## 2019-03-06 DIAGNOSIS — E876 Hypokalemia: Secondary | ICD-10-CM | POA: Diagnosis not present

## 2019-03-06 DIAGNOSIS — Z992 Dependence on renal dialysis: Secondary | ICD-10-CM | POA: Diagnosis not present

## 2019-03-07 DIAGNOSIS — N186 End stage renal disease: Secondary | ICD-10-CM | POA: Diagnosis not present

## 2019-03-07 DIAGNOSIS — Z79899 Other long term (current) drug therapy: Secondary | ICD-10-CM | POA: Diagnosis not present

## 2019-03-07 DIAGNOSIS — Z942 Lung transplant status: Secondary | ICD-10-CM | POA: Diagnosis not present

## 2019-03-07 DIAGNOSIS — N19 Unspecified kidney failure: Secondary | ICD-10-CM | POA: Diagnosis not present

## 2019-03-07 DIAGNOSIS — Z298 Encounter for other specified prophylactic measures: Secondary | ICD-10-CM | POA: Diagnosis not present

## 2019-03-07 DIAGNOSIS — T86819 Unspecified complication of lung transplant: Secondary | ICD-10-CM | POA: Diagnosis not present

## 2019-03-07 DIAGNOSIS — Z992 Dependence on renal dialysis: Secondary | ICD-10-CM | POA: Diagnosis not present

## 2019-03-07 DIAGNOSIS — Z5181 Encounter for therapeutic drug level monitoring: Secondary | ICD-10-CM | POA: Diagnosis not present

## 2019-03-08 DIAGNOSIS — Z992 Dependence on renal dialysis: Secondary | ICD-10-CM | POA: Diagnosis not present

## 2019-03-08 DIAGNOSIS — D631 Anemia in chronic kidney disease: Secondary | ICD-10-CM | POA: Diagnosis not present

## 2019-03-08 DIAGNOSIS — E1129 Type 2 diabetes mellitus with other diabetic kidney complication: Secondary | ICD-10-CM | POA: Diagnosis not present

## 2019-03-08 DIAGNOSIS — E876 Hypokalemia: Secondary | ICD-10-CM | POA: Diagnosis not present

## 2019-03-08 DIAGNOSIS — N2581 Secondary hyperparathyroidism of renal origin: Secondary | ICD-10-CM | POA: Diagnosis not present

## 2019-03-08 DIAGNOSIS — T8579XA Infection and inflammatory reaction due to other internal prosthetic devices, implants and grafts, initial encounter: Secondary | ICD-10-CM | POA: Diagnosis not present

## 2019-03-08 DIAGNOSIS — N186 End stage renal disease: Secondary | ICD-10-CM | POA: Diagnosis not present

## 2019-03-11 DIAGNOSIS — T8579XA Infection and inflammatory reaction due to other internal prosthetic devices, implants and grafts, initial encounter: Secondary | ICD-10-CM | POA: Diagnosis not present

## 2019-03-11 DIAGNOSIS — Z992 Dependence on renal dialysis: Secondary | ICD-10-CM | POA: Diagnosis not present

## 2019-03-11 DIAGNOSIS — E1129 Type 2 diabetes mellitus with other diabetic kidney complication: Secondary | ICD-10-CM | POA: Diagnosis not present

## 2019-03-11 DIAGNOSIS — N186 End stage renal disease: Secondary | ICD-10-CM | POA: Diagnosis not present

## 2019-03-11 DIAGNOSIS — N2581 Secondary hyperparathyroidism of renal origin: Secondary | ICD-10-CM | POA: Diagnosis not present

## 2019-03-11 DIAGNOSIS — E876 Hypokalemia: Secondary | ICD-10-CM | POA: Diagnosis not present

## 2019-03-13 DIAGNOSIS — N2581 Secondary hyperparathyroidism of renal origin: Secondary | ICD-10-CM | POA: Diagnosis not present

## 2019-03-13 DIAGNOSIS — T8579XA Infection and inflammatory reaction due to other internal prosthetic devices, implants and grafts, initial encounter: Secondary | ICD-10-CM | POA: Diagnosis not present

## 2019-03-13 DIAGNOSIS — N186 End stage renal disease: Secondary | ICD-10-CM | POA: Diagnosis not present

## 2019-03-13 DIAGNOSIS — E1129 Type 2 diabetes mellitus with other diabetic kidney complication: Secondary | ICD-10-CM | POA: Diagnosis not present

## 2019-03-13 DIAGNOSIS — Z992 Dependence on renal dialysis: Secondary | ICD-10-CM | POA: Diagnosis not present

## 2019-03-13 DIAGNOSIS — E876 Hypokalemia: Secondary | ICD-10-CM | POA: Diagnosis not present

## 2019-03-14 ENCOUNTER — Telehealth (HOSPITAL_COMMUNITY): Payer: Self-pay | Admitting: *Deleted

## 2019-03-14 DIAGNOSIS — H02132 Senile ectropion of right lower eyelid: Secondary | ICD-10-CM | POA: Diagnosis not present

## 2019-03-14 DIAGNOSIS — H02135 Senile ectropion of left lower eyelid: Secondary | ICD-10-CM | POA: Diagnosis not present

## 2019-03-14 DIAGNOSIS — H04223 Epiphora due to insufficient drainage, bilateral lacrimal glands: Secondary | ICD-10-CM | POA: Diagnosis not present

## 2019-03-14 DIAGNOSIS — H11823 Conjunctivochalasis, bilateral: Secondary | ICD-10-CM | POA: Diagnosis not present

## 2019-03-14 NOTE — Telephone Encounter (Signed)
Called and spoke to pt regarding a referral that we received for him to participate in Pulmonary Rehab.  Carden is well known to pulmonary rehab staff from his multiple occasions of participation.  Last was in February 2019. Mabry indicated that he did not want to come to pulmonary rehab.  He wanted to resume his physical therapy at [facility] therapy for his back pain.  Pt indicated he was going there to receive treatment but had to stop because of the office closure due to Covid 19.  Rimas asked if I would please get a messge to Eliezer Bottom at [facility] clinic.  Called and left detailed message of the pt wishes to Jennifer/  Contact information for [facility] was given.  Cherre Huger, BSN Cardiac and Training and development officer

## 2019-03-15 DIAGNOSIS — Z992 Dependence on renal dialysis: Secondary | ICD-10-CM | POA: Diagnosis not present

## 2019-03-15 DIAGNOSIS — T8579XA Infection and inflammatory reaction due to other internal prosthetic devices, implants and grafts, initial encounter: Secondary | ICD-10-CM | POA: Diagnosis not present

## 2019-03-15 DIAGNOSIS — E876 Hypokalemia: Secondary | ICD-10-CM | POA: Diagnosis not present

## 2019-03-15 DIAGNOSIS — E1129 Type 2 diabetes mellitus with other diabetic kidney complication: Secondary | ICD-10-CM | POA: Diagnosis not present

## 2019-03-15 DIAGNOSIS — N186 End stage renal disease: Secondary | ICD-10-CM | POA: Diagnosis not present

## 2019-03-15 DIAGNOSIS — N2581 Secondary hyperparathyroidism of renal origin: Secondary | ICD-10-CM | POA: Diagnosis not present

## 2019-03-18 DIAGNOSIS — N186 End stage renal disease: Secondary | ICD-10-CM | POA: Diagnosis not present

## 2019-03-18 DIAGNOSIS — T8579XA Infection and inflammatory reaction due to other internal prosthetic devices, implants and grafts, initial encounter: Secondary | ICD-10-CM | POA: Diagnosis not present

## 2019-03-18 DIAGNOSIS — E1129 Type 2 diabetes mellitus with other diabetic kidney complication: Secondary | ICD-10-CM | POA: Diagnosis not present

## 2019-03-18 DIAGNOSIS — N2581 Secondary hyperparathyroidism of renal origin: Secondary | ICD-10-CM | POA: Diagnosis not present

## 2019-03-18 DIAGNOSIS — E876 Hypokalemia: Secondary | ICD-10-CM | POA: Diagnosis not present

## 2019-03-18 DIAGNOSIS — Z992 Dependence on renal dialysis: Secondary | ICD-10-CM | POA: Diagnosis not present

## 2019-03-20 DIAGNOSIS — N2581 Secondary hyperparathyroidism of renal origin: Secondary | ICD-10-CM | POA: Diagnosis not present

## 2019-03-20 DIAGNOSIS — E876 Hypokalemia: Secondary | ICD-10-CM | POA: Diagnosis not present

## 2019-03-20 DIAGNOSIS — E1129 Type 2 diabetes mellitus with other diabetic kidney complication: Secondary | ICD-10-CM | POA: Diagnosis not present

## 2019-03-20 DIAGNOSIS — N186 End stage renal disease: Secondary | ICD-10-CM | POA: Diagnosis not present

## 2019-03-20 DIAGNOSIS — Z992 Dependence on renal dialysis: Secondary | ICD-10-CM | POA: Diagnosis not present

## 2019-03-20 DIAGNOSIS — T8579XA Infection and inflammatory reaction due to other internal prosthetic devices, implants and grafts, initial encounter: Secondary | ICD-10-CM | POA: Diagnosis not present

## 2019-03-22 DIAGNOSIS — T8579XA Infection and inflammatory reaction due to other internal prosthetic devices, implants and grafts, initial encounter: Secondary | ICD-10-CM | POA: Diagnosis not present

## 2019-03-22 DIAGNOSIS — Z01818 Encounter for other preprocedural examination: Secondary | ICD-10-CM | POA: Diagnosis not present

## 2019-03-22 DIAGNOSIS — N2581 Secondary hyperparathyroidism of renal origin: Secondary | ICD-10-CM | POA: Diagnosis not present

## 2019-03-22 DIAGNOSIS — N186 End stage renal disease: Secondary | ICD-10-CM | POA: Diagnosis not present

## 2019-03-22 DIAGNOSIS — Z992 Dependence on renal dialysis: Secondary | ICD-10-CM | POA: Diagnosis not present

## 2019-03-22 DIAGNOSIS — E1129 Type 2 diabetes mellitus with other diabetic kidney complication: Secondary | ICD-10-CM | POA: Diagnosis not present

## 2019-03-22 DIAGNOSIS — E876 Hypokalemia: Secondary | ICD-10-CM | POA: Diagnosis not present

## 2019-03-25 DIAGNOSIS — I120 Hypertensive chronic kidney disease with stage 5 chronic kidney disease or end stage renal disease: Secondary | ICD-10-CM | POA: Diagnosis not present

## 2019-03-25 DIAGNOSIS — H11823 Conjunctivochalasis, bilateral: Secondary | ICD-10-CM | POA: Diagnosis not present

## 2019-03-25 DIAGNOSIS — E1122 Type 2 diabetes mellitus with diabetic chronic kidney disease: Secondary | ICD-10-CM | POA: Diagnosis not present

## 2019-03-25 DIAGNOSIS — H02135 Senile ectropion of left lower eyelid: Secondary | ICD-10-CM | POA: Diagnosis not present

## 2019-03-25 DIAGNOSIS — Z886 Allergy status to analgesic agent status: Secondary | ICD-10-CM | POA: Diagnosis not present

## 2019-03-25 DIAGNOSIS — Z79899 Other long term (current) drug therapy: Secondary | ICD-10-CM | POA: Diagnosis not present

## 2019-03-25 DIAGNOSIS — K219 Gastro-esophageal reflux disease without esophagitis: Secondary | ICD-10-CM | POA: Diagnosis not present

## 2019-03-25 DIAGNOSIS — H04223 Epiphora due to insufficient drainage, bilateral lacrimal glands: Secondary | ICD-10-CM | POA: Diagnosis not present

## 2019-03-25 DIAGNOSIS — Z7982 Long term (current) use of aspirin: Secondary | ICD-10-CM | POA: Diagnosis not present

## 2019-03-25 DIAGNOSIS — Z881 Allergy status to other antibiotic agents status: Secondary | ICD-10-CM | POA: Diagnosis not present

## 2019-03-25 DIAGNOSIS — G4733 Obstructive sleep apnea (adult) (pediatric): Secondary | ICD-10-CM | POA: Diagnosis not present

## 2019-03-25 DIAGNOSIS — N186 End stage renal disease: Secondary | ICD-10-CM | POA: Diagnosis not present

## 2019-03-25 DIAGNOSIS — Z992 Dependence on renal dialysis: Secondary | ICD-10-CM | POA: Diagnosis not present

## 2019-03-25 DIAGNOSIS — Z794 Long term (current) use of insulin: Secondary | ICD-10-CM | POA: Diagnosis not present

## 2019-03-25 DIAGNOSIS — Z87891 Personal history of nicotine dependence: Secondary | ICD-10-CM | POA: Diagnosis not present

## 2019-03-25 DIAGNOSIS — H02132 Senile ectropion of right lower eyelid: Secondary | ICD-10-CM | POA: Diagnosis not present

## 2019-03-26 DIAGNOSIS — T8579XA Infection and inflammatory reaction due to other internal prosthetic devices, implants and grafts, initial encounter: Secondary | ICD-10-CM | POA: Diagnosis not present

## 2019-03-26 DIAGNOSIS — E876 Hypokalemia: Secondary | ICD-10-CM | POA: Diagnosis not present

## 2019-03-26 DIAGNOSIS — Z992 Dependence on renal dialysis: Secondary | ICD-10-CM | POA: Diagnosis not present

## 2019-03-26 DIAGNOSIS — N2581 Secondary hyperparathyroidism of renal origin: Secondary | ICD-10-CM | POA: Diagnosis not present

## 2019-03-26 DIAGNOSIS — N186 End stage renal disease: Secondary | ICD-10-CM | POA: Diagnosis not present

## 2019-03-26 DIAGNOSIS — E1129 Type 2 diabetes mellitus with other diabetic kidney complication: Secondary | ICD-10-CM | POA: Diagnosis not present

## 2019-03-27 DIAGNOSIS — N186 End stage renal disease: Secondary | ICD-10-CM | POA: Diagnosis not present

## 2019-03-27 DIAGNOSIS — E1129 Type 2 diabetes mellitus with other diabetic kidney complication: Secondary | ICD-10-CM | POA: Diagnosis not present

## 2019-03-27 DIAGNOSIS — E876 Hypokalemia: Secondary | ICD-10-CM | POA: Diagnosis not present

## 2019-03-27 DIAGNOSIS — Z992 Dependence on renal dialysis: Secondary | ICD-10-CM | POA: Diagnosis not present

## 2019-03-27 DIAGNOSIS — N2581 Secondary hyperparathyroidism of renal origin: Secondary | ICD-10-CM | POA: Diagnosis not present

## 2019-03-27 DIAGNOSIS — T8579XA Infection and inflammatory reaction due to other internal prosthetic devices, implants and grafts, initial encounter: Secondary | ICD-10-CM | POA: Diagnosis not present

## 2019-03-29 DIAGNOSIS — N186 End stage renal disease: Secondary | ICD-10-CM | POA: Diagnosis not present

## 2019-03-29 DIAGNOSIS — E876 Hypokalemia: Secondary | ICD-10-CM | POA: Diagnosis not present

## 2019-03-29 DIAGNOSIS — T8579XA Infection and inflammatory reaction due to other internal prosthetic devices, implants and grafts, initial encounter: Secondary | ICD-10-CM | POA: Diagnosis not present

## 2019-03-29 DIAGNOSIS — E1129 Type 2 diabetes mellitus with other diabetic kidney complication: Secondary | ICD-10-CM | POA: Diagnosis not present

## 2019-03-29 DIAGNOSIS — Z992 Dependence on renal dialysis: Secondary | ICD-10-CM | POA: Diagnosis not present

## 2019-03-29 DIAGNOSIS — N2581 Secondary hyperparathyroidism of renal origin: Secondary | ICD-10-CM | POA: Diagnosis not present

## 2019-04-01 DIAGNOSIS — Z992 Dependence on renal dialysis: Secondary | ICD-10-CM | POA: Diagnosis not present

## 2019-04-01 DIAGNOSIS — T8579XA Infection and inflammatory reaction due to other internal prosthetic devices, implants and grafts, initial encounter: Secondary | ICD-10-CM | POA: Diagnosis not present

## 2019-04-01 DIAGNOSIS — N2581 Secondary hyperparathyroidism of renal origin: Secondary | ICD-10-CM | POA: Diagnosis not present

## 2019-04-01 DIAGNOSIS — N186 End stage renal disease: Secondary | ICD-10-CM | POA: Diagnosis not present

## 2019-04-01 DIAGNOSIS — E876 Hypokalemia: Secondary | ICD-10-CM | POA: Diagnosis not present

## 2019-04-01 DIAGNOSIS — E1129 Type 2 diabetes mellitus with other diabetic kidney complication: Secondary | ICD-10-CM | POA: Diagnosis not present

## 2019-04-03 DIAGNOSIS — T8579XA Infection and inflammatory reaction due to other internal prosthetic devices, implants and grafts, initial encounter: Secondary | ICD-10-CM | POA: Diagnosis not present

## 2019-04-03 DIAGNOSIS — N2581 Secondary hyperparathyroidism of renal origin: Secondary | ICD-10-CM | POA: Diagnosis not present

## 2019-04-03 DIAGNOSIS — E876 Hypokalemia: Secondary | ICD-10-CM | POA: Diagnosis not present

## 2019-04-03 DIAGNOSIS — E1129 Type 2 diabetes mellitus with other diabetic kidney complication: Secondary | ICD-10-CM | POA: Diagnosis not present

## 2019-04-03 DIAGNOSIS — Z992 Dependence on renal dialysis: Secondary | ICD-10-CM | POA: Diagnosis not present

## 2019-04-03 DIAGNOSIS — N186 End stage renal disease: Secondary | ICD-10-CM | POA: Diagnosis not present

## 2019-04-05 DIAGNOSIS — N2581 Secondary hyperparathyroidism of renal origin: Secondary | ICD-10-CM | POA: Diagnosis not present

## 2019-04-05 DIAGNOSIS — E1129 Type 2 diabetes mellitus with other diabetic kidney complication: Secondary | ICD-10-CM | POA: Diagnosis not present

## 2019-04-05 DIAGNOSIS — Z992 Dependence on renal dialysis: Secondary | ICD-10-CM | POA: Diagnosis not present

## 2019-04-05 DIAGNOSIS — E876 Hypokalemia: Secondary | ICD-10-CM | POA: Diagnosis not present

## 2019-04-05 DIAGNOSIS — N186 End stage renal disease: Secondary | ICD-10-CM | POA: Diagnosis not present

## 2019-04-05 DIAGNOSIS — T8579XA Infection and inflammatory reaction due to other internal prosthetic devices, implants and grafts, initial encounter: Secondary | ICD-10-CM | POA: Diagnosis not present

## 2019-04-07 DIAGNOSIS — T86819 Unspecified complication of lung transplant: Secondary | ICD-10-CM | POA: Diagnosis not present

## 2019-04-07 DIAGNOSIS — N186 End stage renal disease: Secondary | ICD-10-CM | POA: Diagnosis not present

## 2019-04-07 DIAGNOSIS — Z992 Dependence on renal dialysis: Secondary | ICD-10-CM | POA: Diagnosis not present

## 2019-04-08 DIAGNOSIS — T8579XA Infection and inflammatory reaction due to other internal prosthetic devices, implants and grafts, initial encounter: Secondary | ICD-10-CM | POA: Diagnosis not present

## 2019-04-08 DIAGNOSIS — E876 Hypokalemia: Secondary | ICD-10-CM | POA: Diagnosis not present

## 2019-04-08 DIAGNOSIS — D509 Iron deficiency anemia, unspecified: Secondary | ICD-10-CM | POA: Diagnosis not present

## 2019-04-08 DIAGNOSIS — Z992 Dependence on renal dialysis: Secondary | ICD-10-CM | POA: Diagnosis not present

## 2019-04-08 DIAGNOSIS — N186 End stage renal disease: Secondary | ICD-10-CM | POA: Diagnosis not present

## 2019-04-08 DIAGNOSIS — N2581 Secondary hyperparathyroidism of renal origin: Secondary | ICD-10-CM | POA: Diagnosis not present

## 2019-04-10 DIAGNOSIS — T8579XA Infection and inflammatory reaction due to other internal prosthetic devices, implants and grafts, initial encounter: Secondary | ICD-10-CM | POA: Diagnosis not present

## 2019-04-10 DIAGNOSIS — D509 Iron deficiency anemia, unspecified: Secondary | ICD-10-CM | POA: Diagnosis not present

## 2019-04-10 DIAGNOSIS — E876 Hypokalemia: Secondary | ICD-10-CM | POA: Diagnosis not present

## 2019-04-10 DIAGNOSIS — Z992 Dependence on renal dialysis: Secondary | ICD-10-CM | POA: Diagnosis not present

## 2019-04-10 DIAGNOSIS — N186 End stage renal disease: Secondary | ICD-10-CM | POA: Diagnosis not present

## 2019-04-10 DIAGNOSIS — N2581 Secondary hyperparathyroidism of renal origin: Secondary | ICD-10-CM | POA: Diagnosis not present

## 2019-04-12 DIAGNOSIS — N186 End stage renal disease: Secondary | ICD-10-CM | POA: Diagnosis not present

## 2019-04-12 DIAGNOSIS — Z992 Dependence on renal dialysis: Secondary | ICD-10-CM | POA: Diagnosis not present

## 2019-04-12 DIAGNOSIS — D509 Iron deficiency anemia, unspecified: Secondary | ICD-10-CM | POA: Diagnosis not present

## 2019-04-12 DIAGNOSIS — T8579XA Infection and inflammatory reaction due to other internal prosthetic devices, implants and grafts, initial encounter: Secondary | ICD-10-CM | POA: Diagnosis not present

## 2019-04-12 DIAGNOSIS — N2581 Secondary hyperparathyroidism of renal origin: Secondary | ICD-10-CM | POA: Diagnosis not present

## 2019-04-12 DIAGNOSIS — E876 Hypokalemia: Secondary | ICD-10-CM | POA: Diagnosis not present

## 2019-04-15 DIAGNOSIS — N2581 Secondary hyperparathyroidism of renal origin: Secondary | ICD-10-CM | POA: Diagnosis not present

## 2019-04-15 DIAGNOSIS — T8579XA Infection and inflammatory reaction due to other internal prosthetic devices, implants and grafts, initial encounter: Secondary | ICD-10-CM | POA: Diagnosis not present

## 2019-04-15 DIAGNOSIS — E876 Hypokalemia: Secondary | ICD-10-CM | POA: Diagnosis not present

## 2019-04-15 DIAGNOSIS — D509 Iron deficiency anemia, unspecified: Secondary | ICD-10-CM | POA: Diagnosis not present

## 2019-04-15 DIAGNOSIS — N186 End stage renal disease: Secondary | ICD-10-CM | POA: Diagnosis not present

## 2019-04-15 DIAGNOSIS — Z992 Dependence on renal dialysis: Secondary | ICD-10-CM | POA: Diagnosis not present

## 2019-04-17 DIAGNOSIS — T8579XA Infection and inflammatory reaction due to other internal prosthetic devices, implants and grafts, initial encounter: Secondary | ICD-10-CM | POA: Diagnosis not present

## 2019-04-17 DIAGNOSIS — D509 Iron deficiency anemia, unspecified: Secondary | ICD-10-CM | POA: Diagnosis not present

## 2019-04-17 DIAGNOSIS — N2581 Secondary hyperparathyroidism of renal origin: Secondary | ICD-10-CM | POA: Diagnosis not present

## 2019-04-17 DIAGNOSIS — E876 Hypokalemia: Secondary | ICD-10-CM | POA: Diagnosis not present

## 2019-04-17 DIAGNOSIS — N186 End stage renal disease: Secondary | ICD-10-CM | POA: Diagnosis not present

## 2019-04-17 DIAGNOSIS — Z992 Dependence on renal dialysis: Secondary | ICD-10-CM | POA: Diagnosis not present

## 2019-04-18 DIAGNOSIS — Z79899 Other long term (current) drug therapy: Secondary | ICD-10-CM | POA: Diagnosis not present

## 2019-04-18 DIAGNOSIS — Z298 Encounter for other specified prophylactic measures: Secondary | ICD-10-CM | POA: Diagnosis not present

## 2019-04-18 DIAGNOSIS — T86819 Unspecified complication of lung transplant: Secondary | ICD-10-CM | POA: Diagnosis not present

## 2019-04-18 DIAGNOSIS — Z5181 Encounter for therapeutic drug level monitoring: Secondary | ICD-10-CM | POA: Diagnosis not present

## 2019-04-18 DIAGNOSIS — Z942 Lung transplant status: Secondary | ICD-10-CM | POA: Diagnosis not present

## 2019-04-19 DIAGNOSIS — N186 End stage renal disease: Secondary | ICD-10-CM | POA: Diagnosis not present

## 2019-04-19 DIAGNOSIS — Z992 Dependence on renal dialysis: Secondary | ICD-10-CM | POA: Diagnosis not present

## 2019-04-19 DIAGNOSIS — N2581 Secondary hyperparathyroidism of renal origin: Secondary | ICD-10-CM | POA: Diagnosis not present

## 2019-04-19 DIAGNOSIS — T8579XA Infection and inflammatory reaction due to other internal prosthetic devices, implants and grafts, initial encounter: Secondary | ICD-10-CM | POA: Diagnosis not present

## 2019-04-19 DIAGNOSIS — E876 Hypokalemia: Secondary | ICD-10-CM | POA: Diagnosis not present

## 2019-04-19 DIAGNOSIS — D509 Iron deficiency anemia, unspecified: Secondary | ICD-10-CM | POA: Diagnosis not present

## 2019-04-22 DIAGNOSIS — N186 End stage renal disease: Secondary | ICD-10-CM | POA: Diagnosis not present

## 2019-04-22 DIAGNOSIS — D509 Iron deficiency anemia, unspecified: Secondary | ICD-10-CM | POA: Diagnosis not present

## 2019-04-22 DIAGNOSIS — Z992 Dependence on renal dialysis: Secondary | ICD-10-CM | POA: Diagnosis not present

## 2019-04-22 DIAGNOSIS — N2581 Secondary hyperparathyroidism of renal origin: Secondary | ICD-10-CM | POA: Diagnosis not present

## 2019-04-22 DIAGNOSIS — E876 Hypokalemia: Secondary | ICD-10-CM | POA: Diagnosis not present

## 2019-04-22 DIAGNOSIS — T8579XA Infection and inflammatory reaction due to other internal prosthetic devices, implants and grafts, initial encounter: Secondary | ICD-10-CM | POA: Diagnosis not present

## 2019-04-24 DIAGNOSIS — Z992 Dependence on renal dialysis: Secondary | ICD-10-CM | POA: Diagnosis not present

## 2019-04-24 DIAGNOSIS — D509 Iron deficiency anemia, unspecified: Secondary | ICD-10-CM | POA: Diagnosis not present

## 2019-04-24 DIAGNOSIS — T8579XA Infection and inflammatory reaction due to other internal prosthetic devices, implants and grafts, initial encounter: Secondary | ICD-10-CM | POA: Diagnosis not present

## 2019-04-24 DIAGNOSIS — N186 End stage renal disease: Secondary | ICD-10-CM | POA: Diagnosis not present

## 2019-04-24 DIAGNOSIS — E876 Hypokalemia: Secondary | ICD-10-CM | POA: Diagnosis not present

## 2019-04-24 DIAGNOSIS — N2581 Secondary hyperparathyroidism of renal origin: Secondary | ICD-10-CM | POA: Diagnosis not present

## 2019-04-25 DIAGNOSIS — R2689 Other abnormalities of gait and mobility: Secondary | ICD-10-CM | POA: Diagnosis not present

## 2019-04-25 DIAGNOSIS — M545 Low back pain: Secondary | ICD-10-CM | POA: Diagnosis not present

## 2019-04-25 DIAGNOSIS — R269 Unspecified abnormalities of gait and mobility: Secondary | ICD-10-CM | POA: Diagnosis not present

## 2019-04-25 DIAGNOSIS — M6281 Muscle weakness (generalized): Secondary | ICD-10-CM | POA: Diagnosis not present

## 2019-04-26 DIAGNOSIS — N186 End stage renal disease: Secondary | ICD-10-CM | POA: Diagnosis not present

## 2019-04-26 DIAGNOSIS — N2581 Secondary hyperparathyroidism of renal origin: Secondary | ICD-10-CM | POA: Diagnosis not present

## 2019-04-26 DIAGNOSIS — T8579XA Infection and inflammatory reaction due to other internal prosthetic devices, implants and grafts, initial encounter: Secondary | ICD-10-CM | POA: Diagnosis not present

## 2019-04-26 DIAGNOSIS — E876 Hypokalemia: Secondary | ICD-10-CM | POA: Diagnosis not present

## 2019-04-26 DIAGNOSIS — Z992 Dependence on renal dialysis: Secondary | ICD-10-CM | POA: Diagnosis not present

## 2019-04-26 DIAGNOSIS — D509 Iron deficiency anemia, unspecified: Secondary | ICD-10-CM | POA: Diagnosis not present

## 2019-04-29 DIAGNOSIS — T8579XA Infection and inflammatory reaction due to other internal prosthetic devices, implants and grafts, initial encounter: Secondary | ICD-10-CM | POA: Diagnosis not present

## 2019-04-29 DIAGNOSIS — N186 End stage renal disease: Secondary | ICD-10-CM | POA: Diagnosis not present

## 2019-04-29 DIAGNOSIS — Z992 Dependence on renal dialysis: Secondary | ICD-10-CM | POA: Diagnosis not present

## 2019-04-29 DIAGNOSIS — E876 Hypokalemia: Secondary | ICD-10-CM | POA: Diagnosis not present

## 2019-04-29 DIAGNOSIS — D509 Iron deficiency anemia, unspecified: Secondary | ICD-10-CM | POA: Diagnosis not present

## 2019-04-29 DIAGNOSIS — N2581 Secondary hyperparathyroidism of renal origin: Secondary | ICD-10-CM | POA: Diagnosis not present

## 2019-04-30 DIAGNOSIS — R269 Unspecified abnormalities of gait and mobility: Secondary | ICD-10-CM | POA: Diagnosis not present

## 2019-04-30 DIAGNOSIS — M545 Low back pain: Secondary | ICD-10-CM | POA: Diagnosis not present

## 2019-04-30 DIAGNOSIS — R2689 Other abnormalities of gait and mobility: Secondary | ICD-10-CM | POA: Diagnosis not present

## 2019-04-30 DIAGNOSIS — E119 Type 2 diabetes mellitus without complications: Secondary | ICD-10-CM | POA: Diagnosis not present

## 2019-04-30 DIAGNOSIS — M6281 Muscle weakness (generalized): Secondary | ICD-10-CM | POA: Diagnosis not present

## 2019-04-30 DIAGNOSIS — H04223 Epiphora due to insufficient drainage, bilateral lacrimal glands: Secondary | ICD-10-CM | POA: Diagnosis not present

## 2019-04-30 DIAGNOSIS — H11823 Conjunctivochalasis, bilateral: Secondary | ICD-10-CM | POA: Diagnosis not present

## 2019-04-30 DIAGNOSIS — H4063X0 Glaucoma secondary to drugs, bilateral, stage unspecified: Secondary | ICD-10-CM | POA: Diagnosis not present

## 2019-05-01 DIAGNOSIS — T8579XA Infection and inflammatory reaction due to other internal prosthetic devices, implants and grafts, initial encounter: Secondary | ICD-10-CM | POA: Diagnosis not present

## 2019-05-01 DIAGNOSIS — N186 End stage renal disease: Secondary | ICD-10-CM | POA: Diagnosis not present

## 2019-05-01 DIAGNOSIS — Z992 Dependence on renal dialysis: Secondary | ICD-10-CM | POA: Diagnosis not present

## 2019-05-01 DIAGNOSIS — E876 Hypokalemia: Secondary | ICD-10-CM | POA: Diagnosis not present

## 2019-05-01 DIAGNOSIS — D509 Iron deficiency anemia, unspecified: Secondary | ICD-10-CM | POA: Diagnosis not present

## 2019-05-01 DIAGNOSIS — N2581 Secondary hyperparathyroidism of renal origin: Secondary | ICD-10-CM | POA: Diagnosis not present

## 2019-05-03 DIAGNOSIS — T8579XA Infection and inflammatory reaction due to other internal prosthetic devices, implants and grafts, initial encounter: Secondary | ICD-10-CM | POA: Diagnosis not present

## 2019-05-03 DIAGNOSIS — E876 Hypokalemia: Secondary | ICD-10-CM | POA: Diagnosis not present

## 2019-05-03 DIAGNOSIS — D509 Iron deficiency anemia, unspecified: Secondary | ICD-10-CM | POA: Diagnosis not present

## 2019-05-03 DIAGNOSIS — N2581 Secondary hyperparathyroidism of renal origin: Secondary | ICD-10-CM | POA: Diagnosis not present

## 2019-05-03 DIAGNOSIS — Z992 Dependence on renal dialysis: Secondary | ICD-10-CM | POA: Diagnosis not present

## 2019-05-03 DIAGNOSIS — N186 End stage renal disease: Secondary | ICD-10-CM | POA: Diagnosis not present

## 2019-05-06 DIAGNOSIS — Z992 Dependence on renal dialysis: Secondary | ICD-10-CM | POA: Diagnosis not present

## 2019-05-06 DIAGNOSIS — T8579XA Infection and inflammatory reaction due to other internal prosthetic devices, implants and grafts, initial encounter: Secondary | ICD-10-CM | POA: Diagnosis not present

## 2019-05-06 DIAGNOSIS — E876 Hypokalemia: Secondary | ICD-10-CM | POA: Diagnosis not present

## 2019-05-06 DIAGNOSIS — D509 Iron deficiency anemia, unspecified: Secondary | ICD-10-CM | POA: Diagnosis not present

## 2019-05-06 DIAGNOSIS — N2581 Secondary hyperparathyroidism of renal origin: Secondary | ICD-10-CM | POA: Diagnosis not present

## 2019-05-06 DIAGNOSIS — N186 End stage renal disease: Secondary | ICD-10-CM | POA: Diagnosis not present

## 2019-05-07 DIAGNOSIS — M545 Low back pain: Secondary | ICD-10-CM | POA: Diagnosis not present

## 2019-05-07 DIAGNOSIS — R269 Unspecified abnormalities of gait and mobility: Secondary | ICD-10-CM | POA: Diagnosis not present

## 2019-05-07 DIAGNOSIS — R2689 Other abnormalities of gait and mobility: Secondary | ICD-10-CM | POA: Diagnosis not present

## 2019-05-07 DIAGNOSIS — M6281 Muscle weakness (generalized): Secondary | ICD-10-CM | POA: Diagnosis not present

## 2019-05-09 DIAGNOSIS — R269 Unspecified abnormalities of gait and mobility: Secondary | ICD-10-CM | POA: Diagnosis not present

## 2019-05-09 DIAGNOSIS — R2689 Other abnormalities of gait and mobility: Secondary | ICD-10-CM | POA: Diagnosis not present

## 2019-05-09 DIAGNOSIS — M6281 Muscle weakness (generalized): Secondary | ICD-10-CM | POA: Diagnosis not present

## 2019-05-09 DIAGNOSIS — M545 Low back pain: Secondary | ICD-10-CM | POA: Diagnosis not present

## 2019-05-14 DIAGNOSIS — Z5181 Encounter for therapeutic drug level monitoring: Secondary | ICD-10-CM | POA: Diagnosis not present

## 2019-05-14 DIAGNOSIS — Z942 Lung transplant status: Secondary | ICD-10-CM | POA: Diagnosis not present

## 2019-05-14 DIAGNOSIS — D849 Immunodeficiency, unspecified: Secondary | ICD-10-CM | POA: Diagnosis not present

## 2019-05-16 DIAGNOSIS — M545 Low back pain: Secondary | ICD-10-CM | POA: Diagnosis not present

## 2019-05-16 DIAGNOSIS — M6281 Muscle weakness (generalized): Secondary | ICD-10-CM | POA: Diagnosis not present

## 2019-05-16 DIAGNOSIS — R269 Unspecified abnormalities of gait and mobility: Secondary | ICD-10-CM | POA: Diagnosis not present

## 2019-05-16 DIAGNOSIS — R2689 Other abnormalities of gait and mobility: Secondary | ICD-10-CM | POA: Diagnosis not present

## 2019-05-21 DIAGNOSIS — H04203 Unspecified epiphora, bilateral lacrimal glands: Secondary | ICD-10-CM | POA: Diagnosis not present

## 2019-05-21 DIAGNOSIS — H11822 Conjunctivochalasis, left eye: Secondary | ICD-10-CM | POA: Diagnosis not present

## 2019-05-21 DIAGNOSIS — D3102 Benign neoplasm of left conjunctiva: Secondary | ICD-10-CM | POA: Diagnosis not present

## 2019-05-23 DIAGNOSIS — Z942 Lung transplant status: Secondary | ICD-10-CM | POA: Diagnosis not present

## 2019-05-23 DIAGNOSIS — Z5181 Encounter for therapeutic drug level monitoring: Secondary | ICD-10-CM | POA: Diagnosis not present

## 2019-05-23 DIAGNOSIS — R942 Abnormal results of pulmonary function studies: Secondary | ICD-10-CM | POA: Diagnosis not present

## 2019-05-23 DIAGNOSIS — Z79899 Other long term (current) drug therapy: Secondary | ICD-10-CM | POA: Diagnosis not present

## 2019-05-23 DIAGNOSIS — T86818 Other complications of lung transplant: Secondary | ICD-10-CM | POA: Diagnosis not present

## 2019-06-07 DIAGNOSIS — Z992 Dependence on renal dialysis: Secondary | ICD-10-CM | POA: Diagnosis not present

## 2019-06-07 DIAGNOSIS — N186 End stage renal disease: Secondary | ICD-10-CM | POA: Diagnosis not present

## 2019-06-07 DIAGNOSIS — T86819 Unspecified complication of lung transplant: Secondary | ICD-10-CM | POA: Diagnosis not present

## 2019-06-08 DIAGNOSIS — E876 Hypokalemia: Secondary | ICD-10-CM | POA: Diagnosis not present

## 2019-06-08 DIAGNOSIS — N186 End stage renal disease: Secondary | ICD-10-CM | POA: Diagnosis not present

## 2019-06-08 DIAGNOSIS — T8579XA Infection and inflammatory reaction due to other internal prosthetic devices, implants and grafts, initial encounter: Secondary | ICD-10-CM | POA: Diagnosis not present

## 2019-06-08 DIAGNOSIS — Z992 Dependence on renal dialysis: Secondary | ICD-10-CM | POA: Diagnosis not present

## 2019-06-08 DIAGNOSIS — N2581 Secondary hyperparathyroidism of renal origin: Secondary | ICD-10-CM | POA: Diagnosis not present

## 2019-06-10 DIAGNOSIS — E876 Hypokalemia: Secondary | ICD-10-CM | POA: Diagnosis not present

## 2019-06-10 DIAGNOSIS — T8579XA Infection and inflammatory reaction due to other internal prosthetic devices, implants and grafts, initial encounter: Secondary | ICD-10-CM | POA: Diagnosis not present

## 2019-06-10 DIAGNOSIS — N186 End stage renal disease: Secondary | ICD-10-CM | POA: Diagnosis not present

## 2019-06-10 DIAGNOSIS — Z992 Dependence on renal dialysis: Secondary | ICD-10-CM | POA: Diagnosis not present

## 2019-06-10 DIAGNOSIS — N2581 Secondary hyperparathyroidism of renal origin: Secondary | ICD-10-CM | POA: Diagnosis not present

## 2019-06-12 DIAGNOSIS — Z992 Dependence on renal dialysis: Secondary | ICD-10-CM | POA: Diagnosis not present

## 2019-06-12 DIAGNOSIS — N186 End stage renal disease: Secondary | ICD-10-CM | POA: Diagnosis not present

## 2019-06-12 DIAGNOSIS — N2581 Secondary hyperparathyroidism of renal origin: Secondary | ICD-10-CM | POA: Diagnosis not present

## 2019-06-12 DIAGNOSIS — T8579XA Infection and inflammatory reaction due to other internal prosthetic devices, implants and grafts, initial encounter: Secondary | ICD-10-CM | POA: Diagnosis not present

## 2019-06-12 DIAGNOSIS — E876 Hypokalemia: Secondary | ICD-10-CM | POA: Diagnosis not present

## 2019-06-14 DIAGNOSIS — Z992 Dependence on renal dialysis: Secondary | ICD-10-CM | POA: Diagnosis not present

## 2019-06-14 DIAGNOSIS — N186 End stage renal disease: Secondary | ICD-10-CM | POA: Diagnosis not present

## 2019-06-14 DIAGNOSIS — T8579XA Infection and inflammatory reaction due to other internal prosthetic devices, implants and grafts, initial encounter: Secondary | ICD-10-CM | POA: Diagnosis not present

## 2019-06-14 DIAGNOSIS — N2581 Secondary hyperparathyroidism of renal origin: Secondary | ICD-10-CM | POA: Diagnosis not present

## 2019-06-14 DIAGNOSIS — E876 Hypokalemia: Secondary | ICD-10-CM | POA: Diagnosis not present

## 2019-06-17 DIAGNOSIS — T8579XA Infection and inflammatory reaction due to other internal prosthetic devices, implants and grafts, initial encounter: Secondary | ICD-10-CM | POA: Diagnosis not present

## 2019-06-17 DIAGNOSIS — N186 End stage renal disease: Secondary | ICD-10-CM | POA: Diagnosis not present

## 2019-06-17 DIAGNOSIS — N2581 Secondary hyperparathyroidism of renal origin: Secondary | ICD-10-CM | POA: Diagnosis not present

## 2019-06-17 DIAGNOSIS — E876 Hypokalemia: Secondary | ICD-10-CM | POA: Diagnosis not present

## 2019-06-17 DIAGNOSIS — Z992 Dependence on renal dialysis: Secondary | ICD-10-CM | POA: Diagnosis not present

## 2019-06-19 DIAGNOSIS — N186 End stage renal disease: Secondary | ICD-10-CM | POA: Diagnosis not present

## 2019-06-19 DIAGNOSIS — Z992 Dependence on renal dialysis: Secondary | ICD-10-CM | POA: Diagnosis not present

## 2019-06-19 DIAGNOSIS — T8579XA Infection and inflammatory reaction due to other internal prosthetic devices, implants and grafts, initial encounter: Secondary | ICD-10-CM | POA: Diagnosis not present

## 2019-06-19 DIAGNOSIS — E1129 Type 2 diabetes mellitus with other diabetic kidney complication: Secondary | ICD-10-CM | POA: Diagnosis not present

## 2019-06-19 DIAGNOSIS — N2581 Secondary hyperparathyroidism of renal origin: Secondary | ICD-10-CM | POA: Diagnosis not present

## 2019-06-19 DIAGNOSIS — E876 Hypokalemia: Secondary | ICD-10-CM | POA: Diagnosis not present

## 2019-06-21 DIAGNOSIS — N186 End stage renal disease: Secondary | ICD-10-CM | POA: Diagnosis not present

## 2019-06-21 DIAGNOSIS — N2581 Secondary hyperparathyroidism of renal origin: Secondary | ICD-10-CM | POA: Diagnosis not present

## 2019-06-21 DIAGNOSIS — Z992 Dependence on renal dialysis: Secondary | ICD-10-CM | POA: Diagnosis not present

## 2019-06-21 DIAGNOSIS — T8579XA Infection and inflammatory reaction due to other internal prosthetic devices, implants and grafts, initial encounter: Secondary | ICD-10-CM | POA: Diagnosis not present

## 2019-06-21 DIAGNOSIS — E876 Hypokalemia: Secondary | ICD-10-CM | POA: Diagnosis not present

## 2019-06-24 DIAGNOSIS — Z992 Dependence on renal dialysis: Secondary | ICD-10-CM | POA: Diagnosis not present

## 2019-06-24 DIAGNOSIS — N186 End stage renal disease: Secondary | ICD-10-CM | POA: Diagnosis not present

## 2019-06-24 DIAGNOSIS — T8579XA Infection and inflammatory reaction due to other internal prosthetic devices, implants and grafts, initial encounter: Secondary | ICD-10-CM | POA: Diagnosis not present

## 2019-06-24 DIAGNOSIS — E876 Hypokalemia: Secondary | ICD-10-CM | POA: Diagnosis not present

## 2019-06-24 DIAGNOSIS — N2581 Secondary hyperparathyroidism of renal origin: Secondary | ICD-10-CM | POA: Diagnosis not present

## 2019-06-26 DIAGNOSIS — N186 End stage renal disease: Secondary | ICD-10-CM | POA: Diagnosis not present

## 2019-06-26 DIAGNOSIS — T8579XA Infection and inflammatory reaction due to other internal prosthetic devices, implants and grafts, initial encounter: Secondary | ICD-10-CM | POA: Diagnosis not present

## 2019-06-26 DIAGNOSIS — E876 Hypokalemia: Secondary | ICD-10-CM | POA: Diagnosis not present

## 2019-06-26 DIAGNOSIS — N2581 Secondary hyperparathyroidism of renal origin: Secondary | ICD-10-CM | POA: Diagnosis not present

## 2019-06-26 DIAGNOSIS — Z992 Dependence on renal dialysis: Secondary | ICD-10-CM | POA: Diagnosis not present

## 2019-06-27 DIAGNOSIS — G4733 Obstructive sleep apnea (adult) (pediatric): Secondary | ICD-10-CM | POA: Diagnosis not present

## 2019-06-28 DIAGNOSIS — E876 Hypokalemia: Secondary | ICD-10-CM | POA: Diagnosis not present

## 2019-06-28 DIAGNOSIS — N2581 Secondary hyperparathyroidism of renal origin: Secondary | ICD-10-CM | POA: Diagnosis not present

## 2019-06-28 DIAGNOSIS — T8579XA Infection and inflammatory reaction due to other internal prosthetic devices, implants and grafts, initial encounter: Secondary | ICD-10-CM | POA: Diagnosis not present

## 2019-06-28 DIAGNOSIS — Z992 Dependence on renal dialysis: Secondary | ICD-10-CM | POA: Diagnosis not present

## 2019-06-28 DIAGNOSIS — N186 End stage renal disease: Secondary | ICD-10-CM | POA: Diagnosis not present

## 2019-07-01 DIAGNOSIS — Z794 Long term (current) use of insulin: Secondary | ICD-10-CM | POA: Diagnosis not present

## 2019-07-01 DIAGNOSIS — E876 Hypokalemia: Secondary | ICD-10-CM | POA: Diagnosis not present

## 2019-07-01 DIAGNOSIS — Z942 Lung transplant status: Secondary | ICD-10-CM | POA: Diagnosis not present

## 2019-07-01 DIAGNOSIS — Z992 Dependence on renal dialysis: Secondary | ICD-10-CM | POA: Diagnosis not present

## 2019-07-01 DIAGNOSIS — E1169 Type 2 diabetes mellitus with other specified complication: Secondary | ICD-10-CM | POA: Diagnosis not present

## 2019-07-01 DIAGNOSIS — N2581 Secondary hyperparathyroidism of renal origin: Secondary | ICD-10-CM | POA: Diagnosis not present

## 2019-07-01 DIAGNOSIS — N186 End stage renal disease: Secondary | ICD-10-CM | POA: Diagnosis not present

## 2019-07-01 DIAGNOSIS — Z5181 Encounter for therapeutic drug level monitoring: Secondary | ICD-10-CM | POA: Diagnosis not present

## 2019-07-01 DIAGNOSIS — T8579XA Infection and inflammatory reaction due to other internal prosthetic devices, implants and grafts, initial encounter: Secondary | ICD-10-CM | POA: Diagnosis not present

## 2019-07-02 ENCOUNTER — Ambulatory Visit (INDEPENDENT_AMBULATORY_CARE_PROVIDER_SITE_OTHER): Payer: Medicare Other | Admitting: Physician Assistant

## 2019-07-02 ENCOUNTER — Other Ambulatory Visit: Payer: Self-pay

## 2019-07-02 VITALS — BP 125/63 | HR 71 | Temp 98.0°F | Resp 18 | Ht 63.5 in | Wt 159.0 lb

## 2019-07-02 DIAGNOSIS — T82898D Other specified complication of vascular prosthetic devices, implants and grafts, subsequent encounter: Secondary | ICD-10-CM | POA: Diagnosis not present

## 2019-07-02 DIAGNOSIS — Z992 Dependence on renal dialysis: Secondary | ICD-10-CM | POA: Diagnosis not present

## 2019-07-02 DIAGNOSIS — N186 End stage renal disease: Secondary | ICD-10-CM

## 2019-07-02 DIAGNOSIS — T82510D Breakdown (mechanical) of surgically created arteriovenous fistula, subsequent encounter: Secondary | ICD-10-CM

## 2019-07-02 NOTE — Progress Notes (Signed)
History of Present Illness:  Patient is a 69 y.o. year old male who presents for evaluation of his left UE AV fistula.  She had revision of his fistula by Dr. Carlis Abbott on 01/14/19 for ulcerated skin.  He is here today secondary to pseudoaneurysm of the distal fistula in the same area that was previously revised.      He is currently using the fistula for HD.  He denise pain, numbness or loss of motor in the left UE.  Past medical history includes a lung transplant performed at [facility].  He has recently been started on oral antibiotics for lung infection.    Past Medical History:  Diagnosis Date  . Aortic valve disorders   . Benign neoplasm of colon   . Degeneration of intervertebral disc, site unspecified   . Diabetes mellitus without complication ([place])   . 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 Esophageal reflux   . ESRD (end stage renal disease) on dialysis ([place])   . Essential hypertension   . Obstructive sleep apnea (adult) (pediatric)   . Osteoarthrosis, unspecified whether generalized or localized, unspecified site   . Other and unspecified hyperlipidemia   . Pneumonia    interstitial pneumonia  . Pulmonary fibrosis ([place])   . Renal disorder   . Respiratory failure with hypoxia ([place]) 12/2015  . Shortness of breath dyspnea   . Transplanted, lung ([place])   . Unspecified essential hypertension     Past Surgical History:  Procedure Laterality Date  . COLONOSCOPY WITH PROPOFOL N/A 11/07/2017   Procedure: COLONOSCOPY WITH PROPOFOL;  Surgeon: Ronnette Juniper, MD;  [facility]: [facility];  Service: Gastroenterology;  Laterality: N/A;  . LIGATION OF ARTERIOVENOUS  FISTULA Left 01/14/2019   Procedure: LIGATION OF ARTERIOVENOUS  FISTULA;  Surgeon: Marty Heck, MD;  Location: [facility];  Service: Vascular;  Laterality: Left;  . LUNG BIOPSY  2010  . LUNG TRANSPLANT, SINGLE Right   . POLYPECTOMY  11/07/2017   Procedure: POLYPECTOMY;  Surgeon: Ronnette Juniper, MD;   Location: [facility];  Service: Gastroenterology;;     Social History Social History   Tobacco Use  . Smoking status: Former Smoker    Packs/day: 0.30    Years: 20.00    Pack years: 6.00    Types: Cigarettes    Quit date: 06/06/2002    Years since quitting: 17.0  . Smokeless tobacco: Never Used  Substance Use Topics  . Alcohol use: No    Alcohol/week: 0.0 standard drinks  . Drug use: No    Family History Family History  Problem Relation Age of Onset  . Pancreatic cancer Brother   . Heart disease Father     Allergies  Allergies  Allergen Reactions  . Levofloxacin Other (See Comments)    LOSS OF CONSCIOUSNESS  . Nsaids Other (See Comments)    Patient instructed not to take NSAID's after his lung transplant     Current Outpatient Medications  Medication Sig Dispense Refill  . ACCU-CHEK FASTCLIX LANCETS MISC As directed up to 4 times daily  3  . acetaminophen (TYLENOL) 500 MG tablet Take 500-1,000 mg by mouth every 6 (six) hours as needed for headache.    Marland Kitchen aspirin 81 MG tablet Take 81 mg by mouth daily.     Marland Kitchen azaTHIOprine (IMURAN) 50 MG tablet Take 25 mg by mouth See admin instructions. Take 25mg  by mouth on Monday, Wednesday, and Friday after dialysis    . calcium acetate (PHOSLO)  667 MG capsule Take 1,334 mg by mouth 3 (three) times daily before meals.     . cycloSPORINE (SANDIMMUNE) 25 MG capsule Take 25 mg by mouth See admin instructions. Take 100mg  by mouth in the morning and 75mg  by mouth at night    . dorzolamide (TRUSOPT) 2 % ophthalmic solution Place 1 drop into both eyes 2 (two) times daily.  3  . fluticasone (FLONASE) 50 MCG/ACT nasal spray Place 2 sprays into the nose daily. (Patient taking differently: Place 2 sprays into the nose daily as needed for allergies. ) 30 g 2  . Immune Globulin 10% (PRIVIGEN) 20 GM/200ML SOLN Inject 0.5 g/kg into the vein every 3 (three) months.     . insulin regular (NOVOLIN R) 100 units/mL injection Inject 5 Units into the  skin 3 (three) times daily before meals. Additional units (per sliding scale): BGL 201-250 = 1 unit; 251-300 = 2 units; 301-350 = 3 units; 351-400 = units; >401 = 5 units + CALL LUNG COORDINATOR @ [facility]    . Multiple Vitamin (MULTIVITAMIN) capsule Take 1 capsule by mouth daily.     Marland Kitchen omeprazole (PRILOSEC) 40 MG capsule Take 40 mg by mouth 3 (three) times daily with meals.     . polyvinyl alcohol-povidone (REFRESH) 1.4-0.6 % ophthalmic solution Place 1-2 drops into both eyes daily as needed (for dryness).    . posaconazole (NOXAFIL) 100 MG TBEC delayed-release tablet Take 100 mg by mouth daily. 3 tableys daily at dinner.    . pravastatin (PRAVACHOL) 20 MG tablet Take 20 mg by mouth at bedtime.     . sulfamethoxazole-trimethoprim (BACTRIM,SEPTRA) 400-80 MG tablet Take 1 tablet by mouth See admin instructions. Take one tablet by mouth every Monday, Wednesday, and Friday after dialysis    . valGANciclovir (VALCYTE) 450 MG tablet Take 1 tablet by mouth See admin instructions. Take 1 tablet by mouth every Monday and Friday  After dialysis     No current facility-administered medications for this visit.    ROS:   General:  No weight loss, Fever, chills  HEENT: No recent headaches, no nasal bleeding, no visual changes, no sore throat  Neurologic: No dizziness, blackouts, seizures. No recent symptoms of stroke or mini- stroke. No recent episodes of slurred speech, or temporary blindness.  Cardiac: No recent episodes of chest pain/pressure, no shortness of breath at rest.  No shortness of breath with exertion.  Denies history of atrial fibrillation or irregular heartbeat  Vascular: No history of rest pain in feet.  No history of claudication.  No history of non-healing ulcer, No history of DVT   Pulmonary: No home oxygen, no productive cough, no hemoptysis,  No asthma or wheezing  Musculoskeletal:  [ ]  Arthritis, [ ]  Low back pain,  [ ]  Joint pain  Hematologic:No history of hypercoagulable state.   No history of easy bleeding.  No history of anemia  Gastrointestinal: No hematochezia or melena,  No gastroesophageal reflux, no trouble swallowing  Urinary: [ ]  chronic Kidney disease, [x ] on HD - [x ] MWF or [ ]  TTHS, [ ]  Burning with urination, [ ]  Frequent urination, [ ]  Difficulty urinating;   Skin: No rashes  Psychological: No history of anxiety,  No history of depression   Physical Examination  Vitals:   07/02/19 0959  BP: 125/63  Pulse: 71  Resp: 18  Temp: 98 F (36.7 C)  TempSrc: Temporal  SpO2: 99%  Weight: 159 lb (72.1 kg)  Height: 5' 3.5" (1.613 m)  Body mass index is 27.72 kg/m.  General:  Alert and oriented, no acute distress HEENT: Normal Neck: No bruit or JVD Pulmonary: Clear to auscultation bilaterally Cardiac: Regular Rate and Rhythm without murmur Gastrointestinal: Soft, non-tender, non-distended, no mass, no scars Skin: No rash Extremity Pulses:  2+ radial, brachial pulses bilaterally, palpable thrill in fistula, distal aneurysm verses pseudoaneurysm of the fistula.   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  We have offered him a revision of the fistula and placement of TDC for HD while the fistula heals.    We spoke on the phone with his wife and she wants to call his doctors at [facility] to get there in put before proceeding.  They will call us back with a plan in the near future.    Roxy Horseman PA-C [facility] Office: [phone]  MD in clinic [place]

## 2019-07-02 NOTE — Progress Notes (Deleted)
    Virtual Visit via Telephone Note   I connected with [name] on 07/02/2019 using the [email or web address] by telephone and verified that I was speaking with the correct person using two identifiers. Patient was located at *** and accompanied by ***. I am located at ***.   The limitations of evaluation and management by telemedicine and the availability of in person appointments have been previously discussed with the patient and are documented in the patients chart. The patient expressed understanding and consented to proceed.  PCP: Wenda Low, MD   Chief Complaint: ***  History of Present Illness: [name] is a 69 y.o. male with ***  Past Medical History:  Diagnosis Date  . Aortic valve disorders   . Benign neoplasm of colon   . Degeneration of intervertebral disc, site unspecified   . Diabetes mellitus without complication ([place])   . 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 Esophageal reflux   . ESRD (end stage renal disease) on dialysis ([place])   . Essential hypertension   . Obstructive sleep apnea (adult) (pediatric)   . Osteoarthrosis, unspecified whether generalized or localized, unspecified site   . Other and unspecified hyperlipidemia   . Pneumonia    interstitial pneumonia  . Pulmonary fibrosis ([place])   . Renal disorder   . Respiratory failure with hypoxia ([place]) 12/2015  . Shortness of breath dyspnea   . Transplanted, lung ([place])   . Unspecified essential hypertension   ***  Past Surgical History:  Procedure Laterality Date  . COLONOSCOPY WITH PROPOFOL N/A 11/07/2017   Procedure: COLONOSCOPY WITH PROPOFOL;  Surgeon: Ronnette Juniper, MD;  [facility];  Service: Gastroenterology;  Laterality: N/A;  . LIGATION OF ARTERIOVENOUS  FISTULA Left 01/14/2019   Procedure: LIGATION OF ARTERIOVENOUS  FISTULA;  Surgeon: Marty Heck, MD;  Location: [facility];  Service: Vascular;  Laterality: Left;  . LUNG BIOPSY  2010  . LUNG  TRANSPLANT, SINGLE Right   . POLYPECTOMY  11/07/2017   Procedure: POLYPECTOMY;  Surgeon: Ronnette Juniper, MD;  [facility];  Service: Gastroenterology;;  ***  No outpatient medications have been marked as taking for the 07/02/19 encounter (Office Visit) with [facility] PA.    12 system ROS was negative unless otherwise noted in HPI***   Observations/Objective:   Assessment and Plan:   Follow Up Instructions:   Follow up {follow up:15908}   I discussed the assessment and treatment plan with the patient. The patient was provided an opportunity to ask questions and all were answered. The patient agreed with the plan and demonstrated an understanding of the instructions.   The patient was advised to call back or seek an in-person evaluation if the symptoms worsen or if the condition fails to improve as anticipated.  I spent *** minutes with the patient via telephone encounter.   Signed, Roxy Horseman [facility] [place] Office: [phone]  07/02/2019, 9:18 AM

## 2019-07-03 DIAGNOSIS — N186 End stage renal disease: Secondary | ICD-10-CM | POA: Diagnosis not present

## 2019-07-03 DIAGNOSIS — Z992 Dependence on renal dialysis: Secondary | ICD-10-CM | POA: Diagnosis not present

## 2019-07-03 DIAGNOSIS — E876 Hypokalemia: Secondary | ICD-10-CM | POA: Diagnosis not present

## 2019-07-03 DIAGNOSIS — T8579XA Infection and inflammatory reaction due to other internal prosthetic devices, implants and grafts, initial encounter: Secondary | ICD-10-CM | POA: Diagnosis not present

## 2019-07-03 DIAGNOSIS — N2581 Secondary hyperparathyroidism of renal origin: Secondary | ICD-10-CM | POA: Diagnosis not present

## 2019-07-04 DIAGNOSIS — H11822 Conjunctivochalasis, left eye: Secondary | ICD-10-CM | POA: Diagnosis not present

## 2019-07-04 DIAGNOSIS — H119 Unspecified disorder of conjunctiva: Secondary | ICD-10-CM | POA: Diagnosis not present

## 2019-07-04 DIAGNOSIS — Z942 Lung transplant status: Secondary | ICD-10-CM | POA: Diagnosis not present

## 2019-07-05 DIAGNOSIS — Z992 Dependence on renal dialysis: Secondary | ICD-10-CM | POA: Diagnosis not present

## 2019-07-05 DIAGNOSIS — N2581 Secondary hyperparathyroidism of renal origin: Secondary | ICD-10-CM | POA: Diagnosis not present

## 2019-07-05 DIAGNOSIS — T8579XA Infection and inflammatory reaction due to other internal prosthetic devices, implants and grafts, initial encounter: Secondary | ICD-10-CM | POA: Diagnosis not present

## 2019-07-05 DIAGNOSIS — E876 Hypokalemia: Secondary | ICD-10-CM | POA: Diagnosis not present

## 2019-07-05 DIAGNOSIS — N186 End stage renal disease: Secondary | ICD-10-CM | POA: Diagnosis not present

## 2019-07-08 DIAGNOSIS — N2581 Secondary hyperparathyroidism of renal origin: Secondary | ICD-10-CM | POA: Diagnosis not present

## 2019-07-08 DIAGNOSIS — T86819 Unspecified complication of lung transplant: Secondary | ICD-10-CM | POA: Diagnosis not present

## 2019-07-08 DIAGNOSIS — T8579XA Infection and inflammatory reaction due to other internal prosthetic devices, implants and grafts, initial encounter: Secondary | ICD-10-CM | POA: Diagnosis not present

## 2019-07-08 DIAGNOSIS — N186 End stage renal disease: Secondary | ICD-10-CM | POA: Diagnosis not present

## 2019-07-08 DIAGNOSIS — E876 Hypokalemia: Secondary | ICD-10-CM | POA: Diagnosis not present

## 2019-07-08 DIAGNOSIS — Z992 Dependence on renal dialysis: Secondary | ICD-10-CM | POA: Diagnosis not present

## 2019-07-10 DIAGNOSIS — E876 Hypokalemia: Secondary | ICD-10-CM | POA: Diagnosis not present

## 2019-07-10 DIAGNOSIS — N186 End stage renal disease: Secondary | ICD-10-CM | POA: Diagnosis not present

## 2019-07-10 DIAGNOSIS — N2581 Secondary hyperparathyroidism of renal origin: Secondary | ICD-10-CM | POA: Diagnosis not present

## 2019-07-10 DIAGNOSIS — Z992 Dependence on renal dialysis: Secondary | ICD-10-CM | POA: Diagnosis not present

## 2019-07-10 DIAGNOSIS — T8579XA Infection and inflammatory reaction due to other internal prosthetic devices, implants and grafts, initial encounter: Secondary | ICD-10-CM | POA: Diagnosis not present

## 2019-07-12 DIAGNOSIS — E1169 Type 2 diabetes mellitus with other specified complication: Secondary | ICD-10-CM | POA: Diagnosis not present

## 2019-07-12 DIAGNOSIS — T8579XA Infection and inflammatory reaction due to other internal prosthetic devices, implants and grafts, initial encounter: Secondary | ICD-10-CM | POA: Diagnosis not present

## 2019-07-12 DIAGNOSIS — Z942 Lung transplant status: Secondary | ICD-10-CM | POA: Diagnosis not present

## 2019-07-12 DIAGNOSIS — E876 Hypokalemia: Secondary | ICD-10-CM | POA: Diagnosis not present

## 2019-07-12 DIAGNOSIS — Z992 Dependence on renal dialysis: Secondary | ICD-10-CM | POA: Diagnosis not present

## 2019-07-12 DIAGNOSIS — N2581 Secondary hyperparathyroidism of renal origin: Secondary | ICD-10-CM | POA: Diagnosis not present

## 2019-07-12 DIAGNOSIS — N186 End stage renal disease: Secondary | ICD-10-CM | POA: Diagnosis not present

## 2019-07-12 DIAGNOSIS — N185 Chronic kidney disease, stage 5: Secondary | ICD-10-CM | POA: Diagnosis not present

## 2019-07-12 DIAGNOSIS — D849 Immunodeficiency, unspecified: Secondary | ICD-10-CM | POA: Diagnosis not present

## 2019-07-12 DIAGNOSIS — Z79899 Other long term (current) drug therapy: Secondary | ICD-10-CM | POA: Diagnosis not present

## 2019-07-15 DIAGNOSIS — T8579XA Infection and inflammatory reaction due to other internal prosthetic devices, implants and grafts, initial encounter: Secondary | ICD-10-CM | POA: Diagnosis not present

## 2019-07-15 DIAGNOSIS — Z79899 Other long term (current) drug therapy: Secondary | ICD-10-CM | POA: Diagnosis not present

## 2019-07-15 DIAGNOSIS — N185 Chronic kidney disease, stage 5: Secondary | ICD-10-CM | POA: Diagnosis not present

## 2019-07-15 DIAGNOSIS — N2581 Secondary hyperparathyroidism of renal origin: Secondary | ICD-10-CM | POA: Diagnosis present

## 2019-07-15 DIAGNOSIS — T86810 Lung transplant rejection: Secondary | ICD-10-CM | POA: Diagnosis present

## 2019-07-15 DIAGNOSIS — N250 Renal osteodystrophy: Secondary | ICD-10-CM | POA: Diagnosis present

## 2019-07-15 DIAGNOSIS — F329 Major depressive disorder, single episode, unspecified: Secondary | ICD-10-CM | POA: Diagnosis present

## 2019-07-15 DIAGNOSIS — E1169 Type 2 diabetes mellitus with other specified complication: Secondary | ICD-10-CM | POA: Diagnosis not present

## 2019-07-15 DIAGNOSIS — T82898A Other specified complication of vascular prosthetic devices, implants and grafts, initial encounter: Secondary | ICD-10-CM | POA: Diagnosis present

## 2019-07-15 DIAGNOSIS — G4733 Obstructive sleep apnea (adult) (pediatric): Secondary | ICD-10-CM | POA: Diagnosis present

## 2019-07-15 DIAGNOSIS — R569 Unspecified convulsions: Secondary | ICD-10-CM | POA: Diagnosis present

## 2019-07-15 DIAGNOSIS — Z87891 Personal history of nicotine dependence: Secondary | ICD-10-CM | POA: Diagnosis not present

## 2019-07-15 DIAGNOSIS — R739 Hyperglycemia, unspecified: Secondary | ICD-10-CM | POA: Diagnosis not present

## 2019-07-15 DIAGNOSIS — N186 End stage renal disease: Secondary | ICD-10-CM | POA: Diagnosis present

## 2019-07-15 DIAGNOSIS — Z794 Long term (current) use of insulin: Secondary | ICD-10-CM | POA: Diagnosis not present

## 2019-07-15 DIAGNOSIS — I721 Aneurysm of artery of upper extremity: Secondary | ICD-10-CM | POA: Diagnosis present

## 2019-07-15 DIAGNOSIS — E1151 Type 2 diabetes mellitus with diabetic peripheral angiopathy without gangrene: Secondary | ICD-10-CM | POA: Diagnosis present

## 2019-07-15 DIAGNOSIS — D849 Immunodeficiency, unspecified: Secondary | ICD-10-CM | POA: Diagnosis not present

## 2019-07-15 DIAGNOSIS — I120 Hypertensive chronic kidney disease with stage 5 chronic kidney disease or end stage renal disease: Secondary | ICD-10-CM | POA: Diagnosis present

## 2019-07-15 DIAGNOSIS — B441 Other pulmonary aspergillosis: Secondary | ICD-10-CM | POA: Diagnosis present

## 2019-07-15 DIAGNOSIS — T380X5A Adverse effect of glucocorticoids and synthetic analogues, initial encounter: Secondary | ICD-10-CM | POA: Diagnosis present

## 2019-07-15 DIAGNOSIS — E876 Hypokalemia: Secondary | ICD-10-CM | POA: Diagnosis not present

## 2019-07-15 DIAGNOSIS — Z20822 Contact with and (suspected) exposure to covid-19: Secondary | ICD-10-CM | POA: Diagnosis present

## 2019-07-15 DIAGNOSIS — Z942 Lung transplant status: Secondary | ICD-10-CM | POA: Diagnosis not present

## 2019-07-15 DIAGNOSIS — E1129 Type 2 diabetes mellitus with other diabetic kidney complication: Secondary | ICD-10-CM | POA: Diagnosis not present

## 2019-07-15 DIAGNOSIS — Z7982 Long term (current) use of aspirin: Secondary | ICD-10-CM | POA: Diagnosis not present

## 2019-07-15 DIAGNOSIS — E785 Hyperlipidemia, unspecified: Secondary | ICD-10-CM | POA: Diagnosis present

## 2019-07-15 DIAGNOSIS — Z992 Dependence on renal dialysis: Secondary | ICD-10-CM | POA: Diagnosis not present

## 2019-07-15 DIAGNOSIS — M5416 Radiculopathy, lumbar region: Secondary | ICD-10-CM | POA: Diagnosis present

## 2019-07-15 DIAGNOSIS — D631 Anemia in chronic kidney disease: Secondary | ICD-10-CM | POA: Diagnosis present

## 2019-07-15 DIAGNOSIS — K219 Gastro-esophageal reflux disease without esophagitis: Secondary | ICD-10-CM | POA: Diagnosis present

## 2019-07-15 DIAGNOSIS — E1165 Type 2 diabetes mellitus with hyperglycemia: Secondary | ICD-10-CM | POA: Diagnosis present

## 2019-07-15 DIAGNOSIS — E1122 Type 2 diabetes mellitus with diabetic chronic kidney disease: Secondary | ICD-10-CM | POA: Diagnosis present

## 2019-07-16 DIAGNOSIS — N186 End stage renal disease: Secondary | ICD-10-CM | POA: Diagnosis not present

## 2019-07-17 DIAGNOSIS — N186 End stage renal disease: Secondary | ICD-10-CM | POA: Diagnosis not present

## 2019-07-18 DIAGNOSIS — D849 Immunodeficiency, unspecified: Secondary | ICD-10-CM | POA: Diagnosis not present

## 2019-07-18 DIAGNOSIS — Z942 Lung transplant status: Secondary | ICD-10-CM | POA: Diagnosis not present

## 2019-07-18 DIAGNOSIS — N186 End stage renal disease: Secondary | ICD-10-CM | POA: Diagnosis not present

## 2019-07-18 DIAGNOSIS — T86810 Lung transplant rejection: Secondary | ICD-10-CM | POA: Diagnosis not present

## 2019-07-19 DIAGNOSIS — N186 End stage renal disease: Secondary | ICD-10-CM | POA: Diagnosis not present

## 2019-07-20 DIAGNOSIS — D849 Immunodeficiency, unspecified: Secondary | ICD-10-CM | POA: Diagnosis not present

## 2019-07-22 DIAGNOSIS — Z992 Dependence on renal dialysis: Secondary | ICD-10-CM | POA: Diagnosis not present

## 2019-07-22 DIAGNOSIS — N2581 Secondary hyperparathyroidism of renal origin: Secondary | ICD-10-CM | POA: Diagnosis not present

## 2019-07-22 DIAGNOSIS — T8579XA Infection and inflammatory reaction due to other internal prosthetic devices, implants and grafts, initial encounter: Secondary | ICD-10-CM | POA: Diagnosis not present

## 2019-07-22 DIAGNOSIS — N186 End stage renal disease: Secondary | ICD-10-CM | POA: Diagnosis not present

## 2019-07-22 DIAGNOSIS — E876 Hypokalemia: Secondary | ICD-10-CM | POA: Diagnosis not present

## 2019-07-23 DIAGNOSIS — J9611 Chronic respiratory failure with hypoxia: Secondary | ICD-10-CM | POA: Diagnosis not present

## 2019-07-23 DIAGNOSIS — K219 Gastro-esophageal reflux disease without esophagitis: Secondary | ICD-10-CM | POA: Diagnosis not present

## 2019-07-23 DIAGNOSIS — I10 Essential (primary) hypertension: Secondary | ICD-10-CM | POA: Diagnosis not present

## 2019-07-23 DIAGNOSIS — N186 End stage renal disease: Secondary | ICD-10-CM | POA: Diagnosis not present

## 2019-07-23 DIAGNOSIS — I739 Peripheral vascular disease, unspecified: Secondary | ICD-10-CM | POA: Diagnosis not present

## 2019-07-23 DIAGNOSIS — I5032 Chronic diastolic (congestive) heart failure: Secondary | ICD-10-CM | POA: Diagnosis not present

## 2019-07-23 DIAGNOSIS — I700 Atherosclerosis of aorta: Secondary | ICD-10-CM | POA: Diagnosis not present

## 2019-07-23 DIAGNOSIS — E1122 Type 2 diabetes mellitus with diabetic chronic kidney disease: Secondary | ICD-10-CM | POA: Diagnosis not present

## 2019-07-23 DIAGNOSIS — R972 Elevated prostate specific antigen [PSA]: Secondary | ICD-10-CM | POA: Diagnosis not present

## 2019-07-23 DIAGNOSIS — Z0000 Encounter for general adult medical examination without abnormal findings: Secondary | ICD-10-CM | POA: Diagnosis not present

## 2019-07-23 DIAGNOSIS — I270 Primary pulmonary hypertension: Secondary | ICD-10-CM | POA: Diagnosis not present

## 2019-07-23 DIAGNOSIS — J849 Interstitial pulmonary disease, unspecified: Secondary | ICD-10-CM | POA: Diagnosis not present

## 2019-07-23 DIAGNOSIS — Z1389 Encounter for screening for other disorder: Secondary | ICD-10-CM | POA: Diagnosis not present

## 2019-07-23 DIAGNOSIS — Z992 Dependence on renal dialysis: Secondary | ICD-10-CM | POA: Diagnosis not present

## 2019-07-24 DIAGNOSIS — Z992 Dependence on renal dialysis: Secondary | ICD-10-CM | POA: Diagnosis not present

## 2019-07-24 DIAGNOSIS — T8579XA Infection and inflammatory reaction due to other internal prosthetic devices, implants and grafts, initial encounter: Secondary | ICD-10-CM | POA: Diagnosis not present

## 2019-07-24 DIAGNOSIS — N186 End stage renal disease: Secondary | ICD-10-CM | POA: Diagnosis not present

## 2019-07-24 DIAGNOSIS — N2581 Secondary hyperparathyroidism of renal origin: Secondary | ICD-10-CM | POA: Diagnosis not present

## 2019-07-24 DIAGNOSIS — E876 Hypokalemia: Secondary | ICD-10-CM | POA: Diagnosis not present

## 2019-07-26 DIAGNOSIS — E876 Hypokalemia: Secondary | ICD-10-CM | POA: Diagnosis not present

## 2019-07-26 DIAGNOSIS — T8579XA Infection and inflammatory reaction due to other internal prosthetic devices, implants and grafts, initial encounter: Secondary | ICD-10-CM | POA: Diagnosis not present

## 2019-07-26 DIAGNOSIS — N2581 Secondary hyperparathyroidism of renal origin: Secondary | ICD-10-CM | POA: Diagnosis not present

## 2019-07-26 DIAGNOSIS — Z992 Dependence on renal dialysis: Secondary | ICD-10-CM | POA: Diagnosis not present

## 2019-07-26 DIAGNOSIS — N186 End stage renal disease: Secondary | ICD-10-CM | POA: Diagnosis not present

## 2019-07-29 DIAGNOSIS — E876 Hypokalemia: Secondary | ICD-10-CM | POA: Diagnosis not present

## 2019-07-29 DIAGNOSIS — T8579XA Infection and inflammatory reaction due to other internal prosthetic devices, implants and grafts, initial encounter: Secondary | ICD-10-CM | POA: Diagnosis not present

## 2019-07-29 DIAGNOSIS — N186 End stage renal disease: Secondary | ICD-10-CM | POA: Diagnosis not present

## 2019-07-29 DIAGNOSIS — Z992 Dependence on renal dialysis: Secondary | ICD-10-CM | POA: Diagnosis not present

## 2019-07-29 DIAGNOSIS — N2581 Secondary hyperparathyroidism of renal origin: Secondary | ICD-10-CM | POA: Diagnosis not present

## 2019-07-30 DIAGNOSIS — Z87891 Personal history of nicotine dependence: Secondary | ICD-10-CM | POA: Diagnosis not present

## 2019-07-30 DIAGNOSIS — N186 End stage renal disease: Secondary | ICD-10-CM | POA: Diagnosis not present

## 2019-07-30 DIAGNOSIS — T82898D Other specified complication of vascular prosthetic devices, implants and grafts, subsequent encounter: Secondary | ICD-10-CM | POA: Diagnosis not present

## 2019-07-31 DIAGNOSIS — E876 Hypokalemia: Secondary | ICD-10-CM | POA: Diagnosis not present

## 2019-07-31 DIAGNOSIS — T8579XA Infection and inflammatory reaction due to other internal prosthetic devices, implants and grafts, initial encounter: Secondary | ICD-10-CM | POA: Diagnosis not present

## 2019-07-31 DIAGNOSIS — N2581 Secondary hyperparathyroidism of renal origin: Secondary | ICD-10-CM | POA: Diagnosis not present

## 2019-07-31 DIAGNOSIS — Z992 Dependence on renal dialysis: Secondary | ICD-10-CM | POA: Diagnosis not present

## 2019-07-31 DIAGNOSIS — N186 End stage renal disease: Secondary | ICD-10-CM | POA: Diagnosis not present

## 2019-08-01 DIAGNOSIS — T380X5A Adverse effect of glucocorticoids and synthetic analogues, initial encounter: Secondary | ICD-10-CM | POA: Diagnosis not present

## 2019-08-01 DIAGNOSIS — H11823 Conjunctivochalasis, bilateral: Secondary | ICD-10-CM | POA: Diagnosis not present

## 2019-08-01 DIAGNOSIS — Z794 Long term (current) use of insulin: Secondary | ICD-10-CM | POA: Diagnosis not present

## 2019-08-01 DIAGNOSIS — Z942 Lung transplant status: Secondary | ICD-10-CM | POA: Diagnosis not present

## 2019-08-01 DIAGNOSIS — H4063X0 Glaucoma secondary to drugs, bilateral, stage unspecified: Secondary | ICD-10-CM | POA: Diagnosis not present

## 2019-08-01 DIAGNOSIS — R739 Hyperglycemia, unspecified: Secondary | ICD-10-CM | POA: Diagnosis not present

## 2019-08-01 DIAGNOSIS — D3142 Benign neoplasm of left ciliary body: Secondary | ICD-10-CM | POA: Diagnosis not present

## 2019-08-01 DIAGNOSIS — Z5181 Encounter for therapeutic drug level monitoring: Secondary | ICD-10-CM | POA: Diagnosis not present

## 2019-08-01 DIAGNOSIS — H04223 Epiphora due to insufficient drainage, bilateral lacrimal glands: Secondary | ICD-10-CM | POA: Diagnosis not present

## 2019-08-01 DIAGNOSIS — E1169 Type 2 diabetes mellitus with other specified complication: Secondary | ICD-10-CM | POA: Diagnosis not present

## 2019-08-02 DIAGNOSIS — N186 End stage renal disease: Secondary | ICD-10-CM | POA: Diagnosis not present

## 2019-08-02 DIAGNOSIS — Z992 Dependence on renal dialysis: Secondary | ICD-10-CM | POA: Diagnosis not present

## 2019-08-02 DIAGNOSIS — E876 Hypokalemia: Secondary | ICD-10-CM | POA: Diagnosis not present

## 2019-08-02 DIAGNOSIS — N2581 Secondary hyperparathyroidism of renal origin: Secondary | ICD-10-CM | POA: Diagnosis not present

## 2019-08-02 DIAGNOSIS — T8579XA Infection and inflammatory reaction due to other internal prosthetic devices, implants and grafts, initial encounter: Secondary | ICD-10-CM | POA: Diagnosis not present

## 2019-08-05 DIAGNOSIS — N186 End stage renal disease: Secondary | ICD-10-CM | POA: Diagnosis not present

## 2019-08-05 DIAGNOSIS — I951 Orthostatic hypotension: Secondary | ICD-10-CM | POA: Diagnosis not present

## 2019-08-05 DIAGNOSIS — Z79899 Other long term (current) drug therapy: Secondary | ICD-10-CM | POA: Diagnosis not present

## 2019-08-05 DIAGNOSIS — T86810 Lung transplant rejection: Secondary | ICD-10-CM | POA: Diagnosis not present

## 2019-08-05 DIAGNOSIS — Z794 Long term (current) use of insulin: Secondary | ICD-10-CM | POA: Diagnosis not present

## 2019-08-05 DIAGNOSIS — K219 Gastro-esophageal reflux disease without esophagitis: Secondary | ICD-10-CM | POA: Diagnosis not present

## 2019-08-05 DIAGNOSIS — E1151 Type 2 diabetes mellitus with diabetic peripheral angiopathy without gangrene: Secondary | ICD-10-CM | POA: Diagnosis not present

## 2019-08-05 DIAGNOSIS — J849 Interstitial pulmonary disease, unspecified: Secondary | ICD-10-CM | POA: Diagnosis not present

## 2019-08-05 DIAGNOSIS — I151 Hypertension secondary to other renal disorders: Secondary | ICD-10-CM | POA: Diagnosis not present

## 2019-08-05 DIAGNOSIS — I70213 Atherosclerosis of native arteries of extremities with intermittent claudication, bilateral legs: Secondary | ICD-10-CM | POA: Diagnosis not present

## 2019-08-05 DIAGNOSIS — N2581 Secondary hyperparathyroidism of renal origin: Secondary | ICD-10-CM | POA: Diagnosis not present

## 2019-08-05 DIAGNOSIS — T86819 Unspecified complication of lung transplant: Secondary | ICD-10-CM | POA: Diagnosis not present

## 2019-08-05 DIAGNOSIS — G4733 Obstructive sleep apnea (adult) (pediatric): Secondary | ICD-10-CM | POA: Diagnosis not present

## 2019-08-05 DIAGNOSIS — Z992 Dependence on renal dialysis: Secondary | ICD-10-CM | POA: Diagnosis not present

## 2019-08-05 DIAGNOSIS — T82898D Other specified complication of vascular prosthetic devices, implants and grafts, subsequent encounter: Secondary | ICD-10-CM | POA: Diagnosis not present

## 2019-08-05 DIAGNOSIS — E876 Hypokalemia: Secondary | ICD-10-CM | POA: Diagnosis not present

## 2019-08-05 DIAGNOSIS — E1122 Type 2 diabetes mellitus with diabetic chronic kidney disease: Secondary | ICD-10-CM | POA: Diagnosis not present

## 2019-08-05 DIAGNOSIS — Z01818 Encounter for other preprocedural examination: Secondary | ICD-10-CM | POA: Diagnosis not present

## 2019-08-05 DIAGNOSIS — E1169 Type 2 diabetes mellitus with other specified complication: Secondary | ICD-10-CM | POA: Diagnosis not present

## 2019-08-05 DIAGNOSIS — T8579XA Infection and inflammatory reaction due to other internal prosthetic devices, implants and grafts, initial encounter: Secondary | ICD-10-CM | POA: Diagnosis not present

## 2019-08-05 DIAGNOSIS — I4510 Unspecified right bundle-branch block: Secondary | ICD-10-CM | POA: Diagnosis not present

## 2019-08-06 DIAGNOSIS — I151 Hypertension secondary to other renal disorders: Secondary | ICD-10-CM | POA: Diagnosis not present

## 2019-08-06 DIAGNOSIS — G4733 Obstructive sleep apnea (adult) (pediatric): Secondary | ICD-10-CM | POA: Diagnosis not present

## 2019-08-06 DIAGNOSIS — I70213 Atherosclerosis of native arteries of extremities with intermittent claudication, bilateral legs: Secondary | ICD-10-CM | POA: Diagnosis not present

## 2019-08-06 DIAGNOSIS — Z20822 Contact with and (suspected) exposure to covid-19: Secondary | ICD-10-CM | POA: Diagnosis not present

## 2019-08-06 DIAGNOSIS — E1122 Type 2 diabetes mellitus with diabetic chronic kidney disease: Secondary | ICD-10-CM | POA: Diagnosis not present

## 2019-08-06 DIAGNOSIS — Z942 Lung transplant status: Secondary | ICD-10-CM | POA: Diagnosis not present

## 2019-08-06 DIAGNOSIS — T82898D Other specified complication of vascular prosthetic devices, implants and grafts, subsequent encounter: Secondary | ICD-10-CM | POA: Diagnosis not present

## 2019-08-06 DIAGNOSIS — D631 Anemia in chronic kidney disease: Secondary | ICD-10-CM | POA: Diagnosis not present

## 2019-08-06 DIAGNOSIS — E1151 Type 2 diabetes mellitus with diabetic peripheral angiopathy without gangrene: Secondary | ICD-10-CM | POA: Diagnosis not present

## 2019-08-06 DIAGNOSIS — N2581 Secondary hyperparathyroidism of renal origin: Secondary | ICD-10-CM | POA: Diagnosis not present

## 2019-08-06 DIAGNOSIS — K219 Gastro-esophageal reflux disease without esophagitis: Secondary | ICD-10-CM | POA: Diagnosis not present

## 2019-08-06 DIAGNOSIS — I4510 Unspecified right bundle-branch block: Secondary | ICD-10-CM | POA: Diagnosis not present

## 2019-08-06 DIAGNOSIS — Z01818 Encounter for other preprocedural examination: Secondary | ICD-10-CM | POA: Diagnosis not present

## 2019-08-06 DIAGNOSIS — R635 Abnormal weight gain: Secondary | ICD-10-CM | POA: Diagnosis not present

## 2019-08-06 DIAGNOSIS — R0600 Dyspnea, unspecified: Secondary | ICD-10-CM | POA: Diagnosis not present

## 2019-08-06 DIAGNOSIS — N2889 Other specified disorders of kidney and ureter: Secondary | ICD-10-CM | POA: Diagnosis not present

## 2019-08-06 DIAGNOSIS — D696 Thrombocytopenia, unspecified: Secondary | ICD-10-CM | POA: Diagnosis not present

## 2019-08-06 DIAGNOSIS — Z794 Long term (current) use of insulin: Secondary | ICD-10-CM | POA: Diagnosis not present

## 2019-08-06 DIAGNOSIS — N186 End stage renal disease: Secondary | ICD-10-CM | POA: Diagnosis not present

## 2019-08-06 DIAGNOSIS — J849 Interstitial pulmonary disease, unspecified: Secondary | ICD-10-CM | POA: Diagnosis not present

## 2019-08-06 DIAGNOSIS — Z79899 Other long term (current) drug therapy: Secondary | ICD-10-CM | POA: Diagnosis not present

## 2019-08-06 DIAGNOSIS — E1169 Type 2 diabetes mellitus with other specified complication: Secondary | ICD-10-CM | POA: Diagnosis not present

## 2019-08-06 DIAGNOSIS — I951 Orthostatic hypotension: Secondary | ICD-10-CM | POA: Diagnosis not present

## 2019-08-06 DIAGNOSIS — T86810 Lung transplant rejection: Secondary | ICD-10-CM | POA: Diagnosis not present

## 2019-08-06 DIAGNOSIS — R9431 Abnormal electrocardiogram [ECG] [EKG]: Secondary | ICD-10-CM | POA: Diagnosis not present

## 2019-08-06 DIAGNOSIS — Z6828 Body mass index (BMI) 28.0-28.9, adult: Secondary | ICD-10-CM | POA: Diagnosis not present

## 2019-08-07 DIAGNOSIS — T8579XA Infection and inflammatory reaction due to other internal prosthetic devices, implants and grafts, initial encounter: Secondary | ICD-10-CM | POA: Diagnosis not present

## 2019-08-07 DIAGNOSIS — N186 End stage renal disease: Secondary | ICD-10-CM | POA: Diagnosis not present

## 2019-08-07 DIAGNOSIS — N2581 Secondary hyperparathyroidism of renal origin: Secondary | ICD-10-CM | POA: Diagnosis not present

## 2019-08-07 DIAGNOSIS — Z992 Dependence on renal dialysis: Secondary | ICD-10-CM | POA: Diagnosis not present

## 2019-08-07 DIAGNOSIS — E876 Hypokalemia: Secondary | ICD-10-CM | POA: Diagnosis not present

## 2019-08-09 DIAGNOSIS — T8579XA Infection and inflammatory reaction due to other internal prosthetic devices, implants and grafts, initial encounter: Secondary | ICD-10-CM | POA: Diagnosis not present

## 2019-08-09 DIAGNOSIS — E876 Hypokalemia: Secondary | ICD-10-CM | POA: Diagnosis not present

## 2019-08-09 DIAGNOSIS — N186 End stage renal disease: Secondary | ICD-10-CM | POA: Diagnosis not present

## 2019-08-09 DIAGNOSIS — N2581 Secondary hyperparathyroidism of renal origin: Secondary | ICD-10-CM | POA: Diagnosis not present

## 2019-08-09 DIAGNOSIS — Z992 Dependence on renal dialysis: Secondary | ICD-10-CM | POA: Diagnosis not present

## 2019-08-12 DIAGNOSIS — N186 End stage renal disease: Secondary | ICD-10-CM | POA: Diagnosis not present

## 2019-08-12 DIAGNOSIS — N2581 Secondary hyperparathyroidism of renal origin: Secondary | ICD-10-CM | POA: Diagnosis not present

## 2019-08-12 DIAGNOSIS — T8579XA Infection and inflammatory reaction due to other internal prosthetic devices, implants and grafts, initial encounter: Secondary | ICD-10-CM | POA: Diagnosis not present

## 2019-08-12 DIAGNOSIS — E876 Hypokalemia: Secondary | ICD-10-CM | POA: Diagnosis not present

## 2019-08-12 DIAGNOSIS — Z992 Dependence on renal dialysis: Secondary | ICD-10-CM | POA: Diagnosis not present

## 2019-08-13 DIAGNOSIS — Z79899 Other long term (current) drug therapy: Secondary | ICD-10-CM | POA: Diagnosis not present

## 2019-08-13 DIAGNOSIS — D8989 Other specified disorders involving the immune mechanism, not elsewhere classified: Secondary | ICD-10-CM | POA: Diagnosis not present

## 2019-08-13 DIAGNOSIS — R760 Raised antibody titer: Secondary | ICD-10-CM | POA: Diagnosis not present

## 2019-08-13 DIAGNOSIS — J8410 Pulmonary fibrosis, unspecified: Secondary | ICD-10-CM | POA: Diagnosis not present

## 2019-08-13 DIAGNOSIS — Z5181 Encounter for therapeutic drug level monitoring: Secondary | ICD-10-CM | POA: Diagnosis not present

## 2019-08-13 DIAGNOSIS — Z942 Lung transplant status: Secondary | ICD-10-CM | POA: Diagnosis not present

## 2019-08-13 DIAGNOSIS — Z794 Long term (current) use of insulin: Secondary | ICD-10-CM | POA: Diagnosis not present

## 2019-08-13 DIAGNOSIS — R739 Hyperglycemia, unspecified: Secondary | ICD-10-CM | POA: Diagnosis not present

## 2019-08-13 DIAGNOSIS — T86810 Lung transplant rejection: Secondary | ICD-10-CM | POA: Diagnosis not present

## 2019-08-13 DIAGNOSIS — T380X5A Adverse effect of glucocorticoids and synthetic analogues, initial encounter: Secondary | ICD-10-CM | POA: Diagnosis not present

## 2019-08-13 DIAGNOSIS — E1169 Type 2 diabetes mellitus with other specified complication: Secondary | ICD-10-CM | POA: Diagnosis not present

## 2019-08-14 DIAGNOSIS — T8579XA Infection and inflammatory reaction due to other internal prosthetic devices, implants and grafts, initial encounter: Secondary | ICD-10-CM | POA: Diagnosis not present

## 2019-08-14 DIAGNOSIS — Z992 Dependence on renal dialysis: Secondary | ICD-10-CM | POA: Diagnosis not present

## 2019-08-14 DIAGNOSIS — N186 End stage renal disease: Secondary | ICD-10-CM | POA: Diagnosis not present

## 2019-08-14 DIAGNOSIS — E876 Hypokalemia: Secondary | ICD-10-CM | POA: Diagnosis not present

## 2019-08-14 DIAGNOSIS — N2581 Secondary hyperparathyroidism of renal origin: Secondary | ICD-10-CM | POA: Diagnosis not present

## 2019-08-16 DIAGNOSIS — E876 Hypokalemia: Secondary | ICD-10-CM | POA: Diagnosis not present

## 2019-08-16 DIAGNOSIS — T8579XA Infection and inflammatory reaction due to other internal prosthetic devices, implants and grafts, initial encounter: Secondary | ICD-10-CM | POA: Diagnosis not present

## 2019-08-16 DIAGNOSIS — N2581 Secondary hyperparathyroidism of renal origin: Secondary | ICD-10-CM | POA: Diagnosis not present

## 2019-08-16 DIAGNOSIS — Z992 Dependence on renal dialysis: Secondary | ICD-10-CM | POA: Diagnosis not present

## 2019-08-16 DIAGNOSIS — N186 End stage renal disease: Secondary | ICD-10-CM | POA: Diagnosis not present

## 2019-08-19 DIAGNOSIS — E876 Hypokalemia: Secondary | ICD-10-CM | POA: Diagnosis not present

## 2019-08-19 DIAGNOSIS — N2581 Secondary hyperparathyroidism of renal origin: Secondary | ICD-10-CM | POA: Diagnosis not present

## 2019-08-19 DIAGNOSIS — T8579XA Infection and inflammatory reaction due to other internal prosthetic devices, implants and grafts, initial encounter: Secondary | ICD-10-CM | POA: Diagnosis not present

## 2019-08-19 DIAGNOSIS — Z992 Dependence on renal dialysis: Secondary | ICD-10-CM | POA: Diagnosis not present

## 2019-08-19 DIAGNOSIS — N186 End stage renal disease: Secondary | ICD-10-CM | POA: Diagnosis not present

## 2019-08-20 DIAGNOSIS — H4063X0 Glaucoma secondary to drugs, bilateral, stage unspecified: Secondary | ICD-10-CM | POA: Diagnosis not present

## 2019-08-20 DIAGNOSIS — H11823 Conjunctivochalasis, bilateral: Secondary | ICD-10-CM | POA: Diagnosis not present

## 2019-08-20 DIAGNOSIS — D3142 Benign neoplasm of left ciliary body: Secondary | ICD-10-CM | POA: Diagnosis not present

## 2019-08-20 DIAGNOSIS — H04223 Epiphora due to insufficient drainage, bilateral lacrimal glands: Secondary | ICD-10-CM | POA: Diagnosis not present

## 2019-08-21 DIAGNOSIS — E876 Hypokalemia: Secondary | ICD-10-CM | POA: Diagnosis not present

## 2019-08-21 DIAGNOSIS — T8579XA Infection and inflammatory reaction due to other internal prosthetic devices, implants and grafts, initial encounter: Secondary | ICD-10-CM | POA: Diagnosis not present

## 2019-08-21 DIAGNOSIS — N2581 Secondary hyperparathyroidism of renal origin: Secondary | ICD-10-CM | POA: Diagnosis not present

## 2019-08-21 DIAGNOSIS — N186 End stage renal disease: Secondary | ICD-10-CM | POA: Diagnosis not present

## 2019-08-21 DIAGNOSIS — Z992 Dependence on renal dialysis: Secondary | ICD-10-CM | POA: Diagnosis not present

## 2019-08-23 DIAGNOSIS — E876 Hypokalemia: Secondary | ICD-10-CM | POA: Diagnosis not present

## 2019-08-23 DIAGNOSIS — N2581 Secondary hyperparathyroidism of renal origin: Secondary | ICD-10-CM | POA: Diagnosis not present

## 2019-08-23 DIAGNOSIS — N186 End stage renal disease: Secondary | ICD-10-CM | POA: Diagnosis not present

## 2019-08-23 DIAGNOSIS — Z992 Dependence on renal dialysis: Secondary | ICD-10-CM | POA: Diagnosis not present

## 2019-08-23 DIAGNOSIS — T8579XA Infection and inflammatory reaction due to other internal prosthetic devices, implants and grafts, initial encounter: Secondary | ICD-10-CM | POA: Diagnosis not present

## 2019-08-26 DIAGNOSIS — E876 Hypokalemia: Secondary | ICD-10-CM | POA: Diagnosis not present

## 2019-08-26 DIAGNOSIS — T8579XA Infection and inflammatory reaction due to other internal prosthetic devices, implants and grafts, initial encounter: Secondary | ICD-10-CM | POA: Diagnosis not present

## 2019-08-26 DIAGNOSIS — N186 End stage renal disease: Secondary | ICD-10-CM | POA: Diagnosis not present

## 2019-08-26 DIAGNOSIS — N2581 Secondary hyperparathyroidism of renal origin: Secondary | ICD-10-CM | POA: Diagnosis not present

## 2019-08-26 DIAGNOSIS — Z992 Dependence on renal dialysis: Secondary | ICD-10-CM | POA: Diagnosis not present

## 2019-08-27 DIAGNOSIS — Z20822 Contact with and (suspected) exposure to covid-19: Secondary | ICD-10-CM | POA: Diagnosis not present

## 2019-08-27 DIAGNOSIS — Z1159 Encounter for screening for other viral diseases: Secondary | ICD-10-CM | POA: Diagnosis not present

## 2019-08-28 DIAGNOSIS — N186 End stage renal disease: Secondary | ICD-10-CM | POA: Diagnosis not present

## 2019-08-28 DIAGNOSIS — T8579XA Infection and inflammatory reaction due to other internal prosthetic devices, implants and grafts, initial encounter: Secondary | ICD-10-CM | POA: Diagnosis not present

## 2019-08-28 DIAGNOSIS — N2581 Secondary hyperparathyroidism of renal origin: Secondary | ICD-10-CM | POA: Diagnosis not present

## 2019-08-28 DIAGNOSIS — E876 Hypokalemia: Secondary | ICD-10-CM | POA: Diagnosis not present

## 2019-08-28 DIAGNOSIS — Z992 Dependence on renal dialysis: Secondary | ICD-10-CM | POA: Diagnosis not present

## 2019-08-29 DIAGNOSIS — N2581 Secondary hyperparathyroidism of renal origin: Secondary | ICD-10-CM | POA: Diagnosis not present

## 2019-08-29 DIAGNOSIS — T8579XA Infection and inflammatory reaction due to other internal prosthetic devices, implants and grafts, initial encounter: Secondary | ICD-10-CM | POA: Diagnosis not present

## 2019-08-29 DIAGNOSIS — E876 Hypokalemia: Secondary | ICD-10-CM | POA: Diagnosis not present

## 2019-08-29 DIAGNOSIS — N186 End stage renal disease: Secondary | ICD-10-CM | POA: Diagnosis not present

## 2019-08-29 DIAGNOSIS — Z992 Dependence on renal dialysis: Secondary | ICD-10-CM | POA: Diagnosis not present

## 2019-08-30 DIAGNOSIS — T82868A Thrombosis of vascular prosthetic devices, implants and grafts, initial encounter: Secondary | ICD-10-CM | POA: Diagnosis not present

## 2019-08-30 DIAGNOSIS — T82898A Other specified complication of vascular prosthetic devices, implants and grafts, initial encounter: Secondary | ICD-10-CM | POA: Diagnosis not present

## 2019-08-30 DIAGNOSIS — Z87891 Personal history of nicotine dependence: Secondary | ICD-10-CM | POA: Diagnosis not present

## 2019-08-30 DIAGNOSIS — Z8711 Personal history of peptic ulcer disease: Secondary | ICD-10-CM | POA: Diagnosis not present

## 2019-08-30 DIAGNOSIS — M79622 Pain in left upper arm: Secondary | ICD-10-CM | POA: Diagnosis not present

## 2019-08-30 DIAGNOSIS — E1122 Type 2 diabetes mellitus with diabetic chronic kidney disease: Secondary | ICD-10-CM | POA: Diagnosis not present

## 2019-08-30 DIAGNOSIS — D631 Anemia in chronic kidney disease: Secondary | ICD-10-CM | POA: Diagnosis not present

## 2019-08-30 DIAGNOSIS — Z992 Dependence on renal dialysis: Secondary | ICD-10-CM | POA: Diagnosis not present

## 2019-08-30 DIAGNOSIS — K219 Gastro-esophageal reflux disease without esophagitis: Secondary | ICD-10-CM | POA: Diagnosis not present

## 2019-08-30 DIAGNOSIS — G8918 Other acute postprocedural pain: Secondary | ICD-10-CM | POA: Diagnosis not present

## 2019-08-30 DIAGNOSIS — N186 End stage renal disease: Secondary | ICD-10-CM | POA: Diagnosis not present

## 2019-08-30 DIAGNOSIS — G4733 Obstructive sleep apnea (adult) (pediatric): Secondary | ICD-10-CM | POA: Diagnosis not present

## 2019-08-30 DIAGNOSIS — T82510A Breakdown (mechanical) of surgically created arteriovenous fistula, initial encounter: Secondary | ICD-10-CM | POA: Diagnosis not present

## 2019-08-30 DIAGNOSIS — Z794 Long term (current) use of insulin: Secondary | ICD-10-CM | POA: Diagnosis not present

## 2019-08-30 DIAGNOSIS — I120 Hypertensive chronic kidney disease with stage 5 chronic kidney disease or end stage renal disease: Secondary | ICD-10-CM | POA: Diagnosis not present

## 2019-09-02 DIAGNOSIS — N186 End stage renal disease: Secondary | ICD-10-CM | POA: Diagnosis not present

## 2019-09-02 DIAGNOSIS — T8579XA Infection and inflammatory reaction due to other internal prosthetic devices, implants and grafts, initial encounter: Secondary | ICD-10-CM | POA: Diagnosis not present

## 2019-09-02 DIAGNOSIS — E876 Hypokalemia: Secondary | ICD-10-CM | POA: Diagnosis not present

## 2019-09-02 DIAGNOSIS — Z992 Dependence on renal dialysis: Secondary | ICD-10-CM | POA: Diagnosis not present

## 2019-09-02 DIAGNOSIS — N2581 Secondary hyperparathyroidism of renal origin: Secondary | ICD-10-CM | POA: Diagnosis not present

## 2019-09-03 ENCOUNTER — Other Ambulatory Visit: Payer: Self-pay

## 2019-09-03 ENCOUNTER — Emergency Department (HOSPITAL_COMMUNITY): Payer: Medicare Other

## 2019-09-03 ENCOUNTER — Encounter (HOSPITAL_COMMUNITY): Payer: Self-pay | Admitting: Emergency Medicine

## 2019-09-03 ENCOUNTER — Emergency Department (HOSPITAL_BASED_OUTPATIENT_CLINIC_OR_DEPARTMENT_OTHER): Payer: Medicare Other

## 2019-09-03 ENCOUNTER — Emergency Department (HOSPITAL_COMMUNITY)
Admission: EM | Admit: 2019-09-03 | Discharge: 2019-09-03 | Disposition: A | Discharge status: Home / Self Care | Payer: Medicare Other | Attending: Emergency Medicine | Admitting: Emergency Medicine

## 2019-09-03 DIAGNOSIS — Z79899 Other long term (current) drug therapy: Secondary | ICD-10-CM | POA: Diagnosis not present

## 2019-09-03 DIAGNOSIS — Z87891 Personal history of nicotine dependence: Secondary | ICD-10-CM | POA: Insufficient documentation

## 2019-09-03 DIAGNOSIS — Z20822 Contact with and (suspected) exposure to covid-19: Secondary | ICD-10-CM | POA: Diagnosis not present

## 2019-09-03 DIAGNOSIS — I120 Hypertensive chronic kidney disease with stage 5 chronic kidney disease or end stage renal disease: Secondary | ICD-10-CM | POA: Diagnosis not present

## 2019-09-03 DIAGNOSIS — N186 End stage renal disease: Secondary | ICD-10-CM | POA: Insufficient documentation

## 2019-09-03 DIAGNOSIS — M7989 Other specified soft tissue disorders: Secondary | ICD-10-CM

## 2019-09-03 DIAGNOSIS — Z7982 Long term (current) use of aspirin: Secondary | ICD-10-CM | POA: Insufficient documentation

## 2019-09-03 DIAGNOSIS — R0602 Shortness of breath: Secondary | ICD-10-CM | POA: Diagnosis not present

## 2019-09-03 DIAGNOSIS — R7989 Other specified abnormal findings of blood chemistry: Secondary | ICD-10-CM | POA: Insufficient documentation

## 2019-09-03 DIAGNOSIS — R778 Other specified abnormalities of plasma proteins: Secondary | ICD-10-CM | POA: Diagnosis not present

## 2019-09-03 DIAGNOSIS — Z942 Lung transplant status: Secondary | ICD-10-CM | POA: Diagnosis not present

## 2019-09-03 DIAGNOSIS — Z992 Dependence on renal dialysis: Secondary | ICD-10-CM | POA: Diagnosis not present

## 2019-09-03 LAB — BASIC METABOLIC PANEL
Anion gap: 17 — ABNORMAL HIGH (ref 5–15)
BUN: 36 mg/dL — ABNORMAL HIGH (ref 8–23)
CO2: 28 mmol/L (ref 22–32)
Calcium: 9.5 mg/dL (ref 8.9–10.3)
Chloride: 93 mmol/L — ABNORMAL LOW (ref 98–111)
Creatinine, Ser: 9.02 mg/dL — ABNORMAL HIGH (ref 0.61–1.24)
GFR calc Af Amer: 6 mL/min — ABNORMAL LOW (ref >60)
GFR calc non Af Amer: 5 mL/min — ABNORMAL LOW (ref >60)
Glucose, Bld: 162 mg/dL — ABNORMAL HIGH (ref 70–99)
Potassium: 3.5 mmol/L (ref 3.5–5.1)
Sodium: 138 mmol/L (ref 135–145)

## 2019-09-03 LAB — TROPONIN I (HIGH SENSITIVITY)
Troponin I (High Sensitivity): 36 ng/L — ABNORMAL HIGH (ref <18)
Troponin I (High Sensitivity): 41 ng/L — ABNORMAL HIGH (ref <18)

## 2019-09-03 LAB — CBC
HCT: 33.9 % — ABNORMAL LOW (ref 39.0–52.0)
Hemoglobin: 10.9 g/dL — ABNORMAL LOW (ref 13.0–17.0)
MCH: 35.2 pg — ABNORMAL HIGH (ref 26.0–34.0)
MCHC: 32.2 g/dL (ref 30.0–36.0)
MCV: 109.4 fL — ABNORMAL HIGH (ref 80.0–100.0)
Platelets: 241 10*3/uL (ref 150–400)
RBC: 3.1 MIL/uL — ABNORMAL LOW (ref 4.22–5.81)
RDW: 17.5 % — ABNORMAL HIGH (ref 11.5–15.5)
WBC: 6.4 10*3/uL (ref 4.0–10.5)
nRBC: 0.6 % — ABNORMAL HIGH (ref 0.0–0.2)

## 2019-09-03 LAB — LACTIC ACID, PLASMA: Lactic Acid, Venous: 1.9 mmol/L (ref 0.5–1.9)

## 2019-09-03 LAB — CBG MONITORING, ED: Glucose-Capillary: 176 mg/dL — ABNORMAL HIGH (ref 70–99)

## 2019-09-03 LAB — BRAIN NATRIURETIC PEPTIDE: B Natriuretic Peptide: 324 pg/mL — ABNORMAL HIGH (ref 0.0–100.0)

## 2019-09-03 LAB — POC SARS CORONAVIRUS 2 AG -  ED: SARS Coronavirus 2 Ag: NEGATIVE

## 2019-09-03 MED ORDER — SODIUM CHLORIDE 0.9 % IV BOLUS: 500.0000 mL | Freq: Once | INTRAVENOUS | Status: DC

## 2019-09-03 MED ORDER — IOHEXOL 350 MG/ML SOLN
80.0000 mL | Freq: Once | INTRAVENOUS | Status: AC | PRN
  Administered 2019-09-03: 80 mL via INTRAVENOUS

## 2019-09-03 MED ORDER — SODIUM CHLORIDE 0.9 % IV BOLUS
500.0000 mL | Freq: Once | INTRAVENOUS | Status: AC
  Administered 2019-09-03: 500 mL via INTRAVENOUS

## 2019-09-03 NOTE — ED Notes (Signed)
Reported concerns with IV to EDPs. Dr Roslynn Amble is going to take a look at getting additional access.

## 2019-09-03 NOTE — ED Notes (Signed)
Blood culture obtained by IV team RN

## 2019-09-03 NOTE — ED Notes (Signed)
Called lab to add on cyclosporine

## 2019-09-03 NOTE — Discharge Instructions (Signed)
Please follow-up with your lung transplant team as discussed.  Return to ER if you develop fever, worsening difficulty breathing, chest pain or other new concerning symptom.

## 2019-09-03 NOTE — ED Notes (Signed)
Patient verbalizes understanding of discharge instructions. Opportunity for questioning and answers were provided. Armband removed by staff, pt discharged from ED.  

## 2019-09-03 NOTE — ED Triage Notes (Signed)
Pt reports difficulty breathing since Friday. States he had surgery on his fistula Friday.

## 2019-09-03 NOTE — Progress Notes (Signed)
Bilateral lower extremity venous duplex complete.  Preliminary results given to care team at bedside.  Please see CV proc tab for preliminary results. Lita Mains- RDMS, RVT 6:49 PM  09/03/2019

## 2019-09-03 NOTE — ED Provider Notes (Addendum)
[facility] EMERGENCY DEPARTMENT Provider Note   CSN: [id number] Arrival date & time: 09/03/19  1608    History Chief Complaint  Patient presents with  . Shortness of Breath    [name] is a 69 y.o. male with significant past medical history, see below who presents for evaluation of shortness of breath.  Patient has been short of breath x2 days however worsening today.  According to patient states his oxygen was in the low 80s when he was ambulating.  Feels generalized weak and is unable to walk due to his weakness.  Cough productive of yellow sputum.  He denies any chest pain.  No recent Covid exposures.  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  Was admitted approximately 6 weeks ago for possible rejection.  Did not have biopsy at that time however was given steroids with some improvement.  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  Denies fever, chills, nausea, vomiting, hemoptysis abdominal pain, diarrhea, dysuria.  Denies any unilateral leg swelling however has bilateral leg swelling.  Denies redness or warmth.  Takes baby aspirin daily however is not anticoagulated.  Denies any pain.  Does not make urine.  Consulted transplant clinic who recommended coming to the emergency department for evaluation.   History obtained from patient and past medical record.  No interpreter is used.  HPI     Past Medical History:  Diagnosis Date  . Aortic valve disorders   . Benign neoplasm of colon   . Degeneration of intervertebral disc, site unspecified   . Diabetes mellitus without complication ([place])   . 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 Esophageal reflux   . ESRD (end stage renal disease) on dialysis ([place])   . Essential hypertension   . Obstructive sleep apnea (adult) (pediatric)   . Osteoarthrosis, unspecified whether generalized or localized, unspecified site   . Other and unspecified hyperlipidemia     . Pneumonia    interstitial pneumonia  . Pulmonary fibrosis ([place])   . Renal disorder   . Respiratory failure with hypoxia ([place]) 12/2015  . Shortness of breath dyspnea   . Transplanted, lung ([place])   . Unspecified essential hypertension     Patient Active Problem List   Diagnosis Date Noted  . 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 AV fistula infection, initial encounter ([place]) 01/14/2019  . Immunocompromised state ([place]) 01/14/2019  . Wound infection 01/13/2019  . Sepsis ([place])   . CKD (chronic kidney disease)   . Generalized weakness 12/30/2015  . ESRD (end stage renal disease) on dialysis ([place])   . 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[place])   . Acute respiratory failure with hypoxemia ([place]) 04/18/2015  . Septic shock ([place]) 04/18/2015  . Acute encephalopathy 04/18/2015  . Acute respiratory failure ([place]) 04/18/2015  . Cardiac arrest ([place])   . HCAP (healthcare-associated pneumonia)   . Elevated rheumatoid factor 05/09/2014  . Essential hypertension 05/09/2014  . ILD (interstitial lung disease) ([place]) 05/09/2014  . Obstructive apnea 05/09/2014  . Lung nodule, solitary 04/18/2014  . Awaiting organ transplant 04/18/2014  . Acute on chronic respiratory failure with hypoxia ([place]) 08/15/2013  . Edema 07/05/2013  . Chronic respiratory failure ([place]) 03/03/2013  . Diabetes mellitus with complication ([place]) 31/51/7616  . Acute sinusitis 05/04/2011  . Cough 11/10/2010  . 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 Obstructive sleep apnea 10/09/2008  . ACID REFLUX DISEASE 10/09/2008  . HIATAL HERNIA 10/09/2008  . OSTEOARTHRITIS 10/09/2008  Past Surgical History:  Procedure Laterality Date  . COLONOSCOPY WITH PROPOFOL N/A 11/07/2017   Procedure: COLONOSCOPY WITH PROPOFOL;  Surgeon: Ronnette Juniper, MD;  Location: [facility];  Service: Gastroenterology;  Laterality: N/A;  . LIGATION OF ARTERIOVENOUS  FISTULA Left 01/14/2019   Procedure: LIGATION OF ARTERIOVENOUS  FISTULA;   Surgeon: Marty Heck, MD;  Location: [facility];  Service: Vascular;  Laterality: Left;  . LUNG BIOPSY  2010  . LUNG TRANSPLANT, SINGLE Right   . POLYPECTOMY  11/07/2017   Procedure: POLYPECTOMY;  Surgeon: Ronnette Juniper, MD;  Location: [facility];  Service: Gastroenterology;;       Family History  Problem Relation Age of Onset  . Pancreatic cancer Brother   . Heart disease Father     Social History   Tobacco Use  . Smoking status: Former Smoker    Packs/day: 0.30    Years: 20.00    Pack years: 6.00    Types: Cigarettes    Quit date: 06/06/2002    Years since quitting: 17.2  . Smokeless tobacco: Never Used  Substance Use Topics  . Alcohol use: No    Alcohol/week: 0.0 standard drinks  . Drug use: No    Home Medications Prior to Admission medications   Medication Sig Start Date End Date Taking? Authorizing Provider  acetaminophen (TYLENOL) 500 MG tablet Take 500-1,000 mg by mouth every 6 (six) hours as needed for moderate pain or headache.    Yes [provider]  Alpha Lipoic Acid 200 MG CAPS Take 1 capsule by mouth in the morning and at bedtime.   Yes [provider]  aspirin 81 MG tablet Take 81 mg by mouth daily.    Yes [provider]  azaTHIOprine (IMURAN) 50 MG tablet Take 25 mg by mouth See admin instructions. Take 25mg  by mouth on Monday, Wednesday, and Friday after dialysis   Yes [provider]  calcium acetate (PHOSLO) 667 MG capsule Take 1,334 mg by mouth 3 (three) times daily before meals.    Yes [provider]  cycloSPORINE (SANDIMMUNE) 25 MG capsule Take 50 mg by mouth 2 (two) times daily. Take 50 mg by mouth in the morning and 50 mg by mouth at night   Yes [provider]  dorzolamidel-timolol (COSOPT PF) 22.3-6.8 MG/ML SOLN ophthalmic solution Place 1 drop into both eyes 2 (two) times daily.   Yes [provider]  fluticasone (FLONASE) 50 MCG/ACT nasal spray Place 2 sprays into the nose  daily. Patient taking differently: Place 2 sprays into the nose daily as needed for allergies.  03/05/13  Yes Wenda Low, MD  Immune Globulin 10% (PRIVIGEN) 20 GM/200ML SOLN Inject 0.5 g/kg into the vein every 3 (three) months.    Yes [provider]  insulin regular (NOVOLIN R) 100 units/mL injection Inject 5 Units into the skin 3 (three) times daily before meals. Additional units (per sliding scale): BGL 201-250 = 1 unit; 251-300 = 2 units; 301-350 = 3 units; 351-400 = units; >401 = 5 units + CALL LUNG COORDINATOR @ [facility]   Yes [provider]  Multiple Vitamin (MULTIVITAMIN) capsule Take 1 capsule by mouth as directed. Take only on (Sun, Tues, Thurs, Sat)   Yes [provider]  omeprazole (PRILOSEC) 40 MG capsule Take 40 mg by mouth in the morning and at bedtime.  11/12/18  Yes [provider]  oxyCODONE (OXY IR/ROXICODONE) 5 MG immediate release tablet Take 5 mg by mouth every 6 (six) hours as needed  for moderate pain or severe pain.  08/31/19  Yes [provider]  polyvinyl alcohol-povidone (REFRESH) 1.4-0.6 % ophthalmic solution Place 1-2 drops into both eyes daily as needed (for dryness).   Yes [provider]  posaconazole (NOXAFIL) 100 MG TBEC delayed-release tablet Take 300 mg by mouth daily. 3 tablets daily at dinner. 06/14/19 06/13/20 Yes [provider]  pravastatin (PRAVACHOL) 20 MG tablet Take 20 mg by mouth at bedtime.    Yes [provider]  prednisoLONE acetate (PRED FORTE) 1 % ophthalmic suspension Place 1 drop into both eyes in the morning and at bedtime.  08/01/19  Yes [provider]  predniSONE (DELTASONE) 5 MG tablet Take 5 mg by mouth daily with breakfast.   Yes [provider]  pyridOXINE (VITAMIN B-6) 100 MG tablet Take 100 mg by mouth daily.   Yes [provider]  sulfamethoxazole-trimethoprim (BACTRIM,SEPTRA) 400-80 MG tablet Take 1 tablet by mouth See admin instructions. Take one  tablet by mouth every Monday, Wednesday, and Friday after dialysis 02/27/17  Yes [provider]  valGANciclovir (VALCYTE) 450 MG tablet Take 1 tablet by mouth See admin instructions. Take 1 tablet by mouth every Monday and Friday  After dialysis 03/26/15  Yes [provider]  ACCU-CHEK FASTCLIX LANCETS MISC As directed up to 4 times daily 12/16/15   [provider]    Allergies    Levofloxacin and Nsaids  Review of Systems   Review of Systems  Constitutional: Positive for activity change, appetite change and fatigue.  Respiratory: Positive for cough and shortness of breath. Negative for apnea, choking, chest tightness, wheezing and stridor.   Cardiovascular: Negative.   Gastrointestinal: Negative.   Genitourinary: Negative.  Penile discharge: Generalized.  Musculoskeletal: Negative.   Skin: Positive for wound.  Neurological: Positive for weakness. Negative for dizziness, tremors, speech difficulty, numbness and headaches.  All other systems reviewed and are negative.   Physical Exam Updated Vital Signs BP (!) 95/57   Pulse 76   Temp 98.4 F (36.9 C) (Oral)   Resp 17   SpO2 97%   Physical Exam Vitals and nursing note reviewed.  Constitutional:      General: He is not in acute distress.    Appearance: He is well-developed. He is ill-appearing (Chronically ill appearing). He is not toxic-appearing or diaphoretic.  HENT:     Head: Normocephalic and atraumatic.     Mouth/Throat:     Pharynx: Oropharynx is clear.  Eyes:     Pupils: Pupils are equal, round, and reactive to light.  Cardiovascular:     Rate and Rhythm: Normal rate and regular rhythm.     Pulses:          Dorsalis pedis pulses are 2+ on the right side and detected w/ Doppler on the left side.     Heart sounds: Normal heart sounds.  Pulmonary:     Effort: Pulmonary effort is normal. No respiratory distress.     Breath sounds: Normal breath sounds. No decreased breath sounds, wheezing,  rhonchi or rales.     Comments: Speaks in full sentences without difficulty.  Lungs clear to auscultation. Chest:     Comments: Dialysis graft right upper chest wall without any overlying skin changes Abdominal:     General: Bowel sounds are normal. There is no distension.     Palpations: Abdomen is soft.     Tenderness: There is no guarding or rebound.     Comments: Soft, nontender.  Insulin monitor to  lower abdomen  Musculoskeletal:        General: Normal range of motion.     Cervical back: Normal range of motion and neck supple.     Right lower leg: No tenderness. Edema present.     Left lower leg: No tenderness. Edema present.     Comments: 3+ pitting edema to bilateral lower extremities just to distal shins.  No erythema or warmth.  Nontender.  No bony tenderness.  Moves bilateral lower extremities without difficulty  Feet:     Right foot:     Skin integrity: Skin integrity normal.     Toenail Condition: Right toenails are abnormally thick and long.     Left foot:     Skin integrity: Skin integrity normal.     Toenail Condition: Left toenails are abnormally thick and long.  Skin:    General: Skin is warm and dry.     Capillary Refill: Capillary refill takes less than 2 seconds.     Comments: AV fistula to left upper extremity with edema, erythema or warmth.  Palpable thrill.  Does have some distal upper extremity edema however no erythema or warmth.  No fluctuance or induration.  Neurological:     General: No focal deficit present.     Mental Status: He is alert and oriented to person, place, and time.     ED Results / Procedures / Treatments   Labs (all labs ordered are listed, but only abnormal results are displayed) Labs Reviewed  CBC - Abnormal; Notable for the following components:      Result Value   RBC 3.10 (*)    Hemoglobin 10.9 (*)    HCT 33.9 (*)    MCV 109.4 (*)    MCH 35.2 (*)    RDW 17.5 (*)    nRBC 0.6 (*)    All other components within normal limits   BASIC METABOLIC PANEL - Abnormal; Notable for the following components:   Chloride 93 (*)    Glucose, Bld 162 (*)    BUN 36 (*)    Creatinine, Ser 9.02 (*)    GFR calc non Af Amer 5 (*)    GFR calc Af Amer 6 (*)    Anion gap 17 (*)    All other components within normal limits  BRAIN NATRIURETIC PEPTIDE - Abnormal; Notable for the following components:   B Natriuretic Peptide 324.0 (*)    All other components within normal limits  CBG MONITORING, ED - Abnormal; Notable for the following components:   Glucose-Capillary 176 (*)    All other components within normal limits  TROPONIN I (HIGH SENSITIVITY) - Abnormal; Notable for the following components:   Troponin I (High Sensitivity) 36 (*)    All other components within normal limits  CULTURE, BLOOD (ROUTINE X 2)  CULTURE, BLOOD (ROUTINE X 2)  LACTIC ACID, PLASMA  CYCLOSPORINE  LACTIC ACID, PLASMA  POC SARS CORONAVIRUS 2 AG -  ED  TROPONIN I (HIGH SENSITIVITY)    EKG EKG Interpretation  Date/Time:  Tuesday September 03 2019 16:11:25 EDT Ventricular Rate:  76 PR Interval:  138 QRS Duration: 140 QT Interval:  440 QTC Calculation: 495 R Axis:   -27 Text Interpretation: Normal sinus rhythm Right bundle branch block Minimal voltage criteria for LVH, may be normal variant ( R in aVL ) Possible Lateral infarct , age undetermined Abnormal ECG Confirmed by Madalyn Rob [phone]) on 09/03/2019 5:31:43 PM   Radiology DG Chest [facility] 1 View  Result  Date: 09/03/2019 CLINICAL DATA:  Shortness of breath EXAM: PORTABLE CHEST 1 VIEW COMPARISON:  01/22/2019 FINDINGS: Rotated to the right. Interstitial opacities within the left lung are unchanged. There is atelectasis at the right lung base. Otherwise, no focal consolidation. No sizable pleural effusion. IMPRESSION: Unchanged interstitial opacities in the left lung compatible with fibrosis. No acute findings. Electronically Signed   By: Ulyses Jarred M.D.   On: 09/03/2019 19:23   VAS [place] LOWER  EXTREMITY VENOUS (DVT) (ONLY [facility] & [facility])  Result Date: 09/03/2019  Lower Venous DVTStudy Indications: Swelling, and SOB.  Performing Technologist: Antonieta Pert RDMS, RVT  Examination Guidelines: A complete evaluation includes B-mode imaging, spectral Doppler, color Doppler, and power Doppler as needed of all accessible portions of each vessel. Bilateral testing is considered an integral part of a complete examination. Limited examinations for reoccurring indications may be performed as noted. The reflux portion of the exam is performed with the patient in reverse Trendelenburg.  +---------+---------------+---------+-----------+----------+--------------+ RIGHT    CompressibilityPhasicitySpontaneityPropertiesThrombus Aging +---------+---------------+---------+-----------+----------+--------------+ CFV      Full           Yes      Yes                                 +---------+---------------+---------+-----------+----------+--------------+ SFJ      Full                                                        +---------+---------------+---------+-----------+----------+--------------+ FV Prox  Full                                                        +---------+---------------+---------+-----------+----------+--------------+ FV Mid   Full                                                        +---------+---------------+---------+-----------+----------+--------------+ FV DistalFull                                                        +---------+---------------+---------+-----------+----------+--------------+ PFV      Full                                                        +---------+---------------+---------+-----------+----------+--------------+ POP      Full           Yes      Yes                                 +---------+---------------+---------+-----------+----------+--------------+ PTV      Full                                                         +---------+---------------+---------+-----------+----------+--------------+  PERO     Full                                                        +---------+---------------+---------+-----------+----------+--------------+ GSV      Full                                                        +---------+---------------+---------+-----------+----------+--------------+ Difficult evaluation due to atherosclerotic shadowing.  +---------+---------------+---------+-----------+----------+--------------+ LEFT     CompressibilityPhasicitySpontaneityPropertiesThrombus Aging +---------+---------------+---------+-----------+----------+--------------+ CFV      Full           Yes      Yes                                 +---------+---------------+---------+-----------+----------+--------------+ SFJ      Full                                                        +---------+---------------+---------+-----------+----------+--------------+ FV Prox  Full                                                        +---------+---------------+---------+-----------+----------+--------------+ FV Mid   Full                                                        +---------+---------------+---------+-----------+----------+--------------+ FV DistalFull                                                        +---------+---------------+---------+-----------+----------+--------------+ PFV      Full                                                        +---------+---------------+---------+-----------+----------+--------------+ POP      Full           Yes      Yes                                 +---------+---------------+---------+-----------+----------+--------------+ PTV      Full                                                        +---------+---------------+---------+-----------+----------+--------------+  PERO     Full                                                         +---------+---------------+---------+-----------+----------+--------------+ GSV      Full                                                        +---------+---------------+---------+-----------+----------+--------------+ Difficult evaluation due to atherosclerotic shadowing.    Summary: RIGHT: - There is no evidence of deep vein thrombosis in the lower extremity. However, portions of this examination were limited- see technologist comments above.  - No cystic structure found in the popliteal fossa.  LEFT: - There is no evidence of deep vein thrombosis in the lower extremity. However, portions of this examination were limited- see technologist comments above.  - No cystic structure found in the popliteal fossa.  *See table(s) above for measurements and observations.    Preliminary     Procedures Procedures (including critical care time)  Medications Ordered in ED Medications  sodium chloride 0.9 % bolus 500 mL (has no administration in time range)  sodium chloride 0.9 % bolus 500 mL (500 mLs Intravenous New Bag/Given 09/03/19 1915)   ED Course  I have reviewed the triage vital signs and the nursing notes.  Pertinent labs & imaging results that were available during my care of the patient were reviewed by me and considered in my medical decision making (see chart for details).  69 year old male with complex medical history presents for evaluation of shortness of breath.  Initially hypotensive on triage however normotensive on my evaluation.  Mild tachypnea to 22.  He is not hypoxic.  Heart and lungs clear.  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  Not on any home oxygen at home.  No Covid exposures.  He does have significant weakness and is unable to walk due to this.  Recent surgery 4 days PTA for AV fistula revision. No history of PE or DVT.  He does have lower extremity pitting edema however no erythema or warmth.  He is not anticoagulated.  Plan on  labs, imaging, reassess. Low suspicion for sepsis at this time.  Labs and imaging personally reviewed and interpreted CBC without leukocytosis, Hgb 70.6 Metabolic panel with hyperglycemia 162, creatinine 9.02, anion gap 17 Covid negative DVT ultrasound bilaterally negative per tech EKG right bundle branch block DG chest with finding changes to left chest consistent with prior exams and fibrosis. Trop 36 no prior to compare  Lactic acid 1.9 Cyclosporine pending BNP 324  1845: Difficulty with IV access.  IV team is in currently assessing patient.  He is comfortable at this time and denies any chest pain.  His oxygen saturations are 95% on room air with good waveform.  Normotensive with systolic at 237.  Will hold on antibiotics given afebrile, normotensive without any tachycardia and has no leukocytosis. Low suspicion for sepsis.  2010: IV team placed IV however not able to run on pump. Will need additional IV assess for CTA chest. Dr. Roslynn Amble to evaluate for additional IV placement.  BPs at [place] visits in the 62'G systolic at  baseline  Patient has been seen and evaluate by attending, Dr. Roslynn Amble who agrees with above treatment, plan.  Care transferred to Dr. Roslynn Amble who will follow up on imaging, labs and determine disposition.  We will plan on touching base with patient's transplant team, they are aware he was here in the ED once CTA has resulted.    MDM Rules/Calculators/A&P                      AKSHAY SPANG was evaluated in Emergency Department on 09/03/2019 for the symptoms described in the history of present illness. He was evaluated in the context of the global COVID-19 pandemic, which necessitated consideration that the patient might be at risk for infection with the SARS-CoV-2 virus that causes COVID-19. Institutional protocols and algorithms that pertain to the evaluation of patients at risk for COVID-19 are in a state of rapid change based on information released  by regulatory bodies including the CDC and federal and state organizations. These policies and algorithms were followed during the patient's care in the ED. Final Clinical Impression(s) / ED Diagnoses Final diagnoses:  SOB (shortness of breath)  Elevated troponin  Elevated brain natriuretic peptide (BNP) level  Hx of lung transplant [facility])    Rx / DC Orders ED Discharge Orders    None       Merna Baldi A, PA-C 09/03/19 2033    Aldona Bryner A, PA-C 09/03/19 2047    Lucrezia Starch, MD 09/03/19 2221

## 2019-09-04 DIAGNOSIS — T8579XA Infection and inflammatory reaction due to other internal prosthetic devices, implants and grafts, initial encounter: Secondary | ICD-10-CM | POA: Diagnosis not present

## 2019-09-04 DIAGNOSIS — N186 End stage renal disease: Secondary | ICD-10-CM | POA: Diagnosis not present

## 2019-09-04 DIAGNOSIS — E876 Hypokalemia: Secondary | ICD-10-CM | POA: Diagnosis not present

## 2019-09-04 DIAGNOSIS — N2581 Secondary hyperparathyroidism of renal origin: Secondary | ICD-10-CM | POA: Diagnosis not present

## 2019-09-04 DIAGNOSIS — Z992 Dependence on renal dialysis: Secondary | ICD-10-CM | POA: Diagnosis not present

## 2019-09-05 DIAGNOSIS — Z7952 Long term (current) use of systemic steroids: Secondary | ICD-10-CM | POA: Diagnosis not present

## 2019-09-05 DIAGNOSIS — I120 Hypertensive chronic kidney disease with stage 5 chronic kidney disease or end stage renal disease: Secondary | ICD-10-CM | POA: Diagnosis present

## 2019-09-05 DIAGNOSIS — K219 Gastro-esophageal reflux disease without esophagitis: Secondary | ICD-10-CM | POA: Diagnosis present

## 2019-09-05 DIAGNOSIS — R760 Raised antibody titer: Secondary | ICD-10-CM | POA: Diagnosis not present

## 2019-09-05 DIAGNOSIS — G4733 Obstructive sleep apnea (adult) (pediatric): Secondary | ICD-10-CM | POA: Diagnosis present

## 2019-09-05 DIAGNOSIS — N186 End stage renal disease: Secondary | ICD-10-CM | POA: Diagnosis present

## 2019-09-05 DIAGNOSIS — Z992 Dependence on renal dialysis: Secondary | ICD-10-CM | POA: Diagnosis not present

## 2019-09-05 DIAGNOSIS — Z87891 Personal history of nicotine dependence: Secondary | ICD-10-CM | POA: Diagnosis not present

## 2019-09-05 DIAGNOSIS — E869 Volume depletion, unspecified: Secondary | ICD-10-CM | POA: Diagnosis present

## 2019-09-05 DIAGNOSIS — Z942 Lung transplant status: Secondary | ICD-10-CM | POA: Diagnosis not present

## 2019-09-05 DIAGNOSIS — D631 Anemia in chronic kidney disease: Secondary | ICD-10-CM | POA: Diagnosis present

## 2019-09-05 DIAGNOSIS — D849 Immunodeficiency, unspecified: Secondary | ICD-10-CM | POA: Diagnosis not present

## 2019-09-05 DIAGNOSIS — I959 Hypotension, unspecified: Secondary | ICD-10-CM | POA: Diagnosis not present

## 2019-09-05 DIAGNOSIS — E440 Moderate protein-calorie malnutrition: Secondary | ICD-10-CM | POA: Diagnosis present

## 2019-09-05 DIAGNOSIS — Z792 Long term (current) use of antibiotics: Secondary | ICD-10-CM | POA: Diagnosis not present

## 2019-09-05 DIAGNOSIS — Z86718 Personal history of other venous thrombosis and embolism: Secondary | ICD-10-CM | POA: Diagnosis not present

## 2019-09-05 DIAGNOSIS — Z6826 Body mass index (BMI) 26.0-26.9, adult: Secondary | ICD-10-CM | POA: Diagnosis not present

## 2019-09-05 DIAGNOSIS — E785 Hyperlipidemia, unspecified: Secondary | ICD-10-CM | POA: Diagnosis present

## 2019-09-05 DIAGNOSIS — Z794 Long term (current) use of insulin: Secondary | ICD-10-CM | POA: Diagnosis not present

## 2019-09-05 DIAGNOSIS — Z79899 Other long term (current) drug therapy: Secondary | ICD-10-CM | POA: Diagnosis not present

## 2019-09-05 DIAGNOSIS — Z8711 Personal history of peptic ulcer disease: Secondary | ICD-10-CM | POA: Diagnosis not present

## 2019-09-05 DIAGNOSIS — R0689 Other abnormalities of breathing: Secondary | ICD-10-CM | POA: Diagnosis not present

## 2019-09-05 DIAGNOSIS — R6889 Other general symptoms and signs: Secondary | ICD-10-CM | POA: Diagnosis not present

## 2019-09-05 DIAGNOSIS — R918 Other nonspecific abnormal finding of lung field: Secondary | ICD-10-CM | POA: Diagnosis not present

## 2019-09-05 DIAGNOSIS — R0600 Dyspnea, unspecified: Secondary | ICD-10-CM | POA: Diagnosis not present

## 2019-09-05 DIAGNOSIS — T86819 Unspecified complication of lung transplant: Secondary | ICD-10-CM | POA: Diagnosis not present

## 2019-09-05 DIAGNOSIS — E1122 Type 2 diabetes mellitus with diabetic chronic kidney disease: Secondary | ICD-10-CM | POA: Diagnosis present

## 2019-09-05 DIAGNOSIS — Z881 Allergy status to other antibiotic agents status: Secondary | ICD-10-CM | POA: Diagnosis not present

## 2019-09-05 DIAGNOSIS — Z20822 Contact with and (suspected) exposure to covid-19: Secondary | ICD-10-CM | POA: Diagnosis present

## 2019-09-05 DIAGNOSIS — R0602 Shortness of breath: Secondary | ICD-10-CM | POA: Diagnosis not present

## 2019-09-05 DIAGNOSIS — E1169 Type 2 diabetes mellitus with other specified complication: Secondary | ICD-10-CM | POA: Diagnosis not present

## 2019-09-05 DIAGNOSIS — Z7982 Long term (current) use of aspirin: Secondary | ICD-10-CM | POA: Diagnosis not present

## 2019-09-05 LAB — CYCLOSPORINE: Cyclosporine, LabCorp: 269 ng/mL (ref 100–400)

## 2019-09-08 LAB — CULTURE, BLOOD (ROUTINE X 2)
Culture: NO GROWTH
Culture: NO GROWTH
Special Requests: ADEQUATE

## 2019-09-09 DIAGNOSIS — T8579XA Infection and inflammatory reaction due to other internal prosthetic devices, implants and grafts, initial encounter: Secondary | ICD-10-CM | POA: Diagnosis not present

## 2019-09-09 DIAGNOSIS — N186 End stage renal disease: Secondary | ICD-10-CM | POA: Diagnosis not present

## 2019-09-09 DIAGNOSIS — D509 Iron deficiency anemia, unspecified: Secondary | ICD-10-CM | POA: Diagnosis not present

## 2019-09-09 DIAGNOSIS — E876 Hypokalemia: Secondary | ICD-10-CM | POA: Diagnosis not present

## 2019-09-09 DIAGNOSIS — Z992 Dependence on renal dialysis: Secondary | ICD-10-CM | POA: Diagnosis not present

## 2019-09-09 DIAGNOSIS — N2581 Secondary hyperparathyroidism of renal origin: Secondary | ICD-10-CM | POA: Diagnosis not present

## 2019-09-10 DIAGNOSIS — Z794 Long term (current) use of insulin: Secondary | ICD-10-CM | POA: Diagnosis not present

## 2019-09-10 DIAGNOSIS — T380X5A Adverse effect of glucocorticoids and synthetic analogues, initial encounter: Secondary | ICD-10-CM | POA: Diagnosis not present

## 2019-09-10 DIAGNOSIS — Z992 Dependence on renal dialysis: Secondary | ICD-10-CM | POA: Diagnosis not present

## 2019-09-10 DIAGNOSIS — E1122 Type 2 diabetes mellitus with diabetic chronic kidney disease: Secondary | ICD-10-CM | POA: Diagnosis not present

## 2019-09-10 DIAGNOSIS — Z79899 Other long term (current) drug therapy: Secondary | ICD-10-CM | POA: Diagnosis not present

## 2019-09-10 DIAGNOSIS — N186 End stage renal disease: Secondary | ICD-10-CM | POA: Diagnosis not present

## 2019-09-10 DIAGNOSIS — Z942 Lung transplant status: Secondary | ICD-10-CM | POA: Diagnosis not present

## 2019-09-10 DIAGNOSIS — E1165 Type 2 diabetes mellitus with hyperglycemia: Secondary | ICD-10-CM | POA: Diagnosis not present

## 2019-09-10 DIAGNOSIS — I120 Hypertensive chronic kidney disease with stage 5 chronic kidney disease or end stage renal disease: Secondary | ICD-10-CM | POA: Diagnosis not present

## 2019-09-10 DIAGNOSIS — Z7952 Long term (current) use of systemic steroids: Secondary | ICD-10-CM | POA: Diagnosis not present

## 2019-09-11 DIAGNOSIS — E876 Hypokalemia: Secondary | ICD-10-CM | POA: Diagnosis not present

## 2019-09-11 DIAGNOSIS — N186 End stage renal disease: Secondary | ICD-10-CM | POA: Diagnosis not present

## 2019-09-11 DIAGNOSIS — N2581 Secondary hyperparathyroidism of renal origin: Secondary | ICD-10-CM | POA: Diagnosis not present

## 2019-09-11 DIAGNOSIS — D509 Iron deficiency anemia, unspecified: Secondary | ICD-10-CM | POA: Diagnosis not present

## 2019-09-11 DIAGNOSIS — Z992 Dependence on renal dialysis: Secondary | ICD-10-CM | POA: Diagnosis not present

## 2019-09-11 DIAGNOSIS — T8579XA Infection and inflammatory reaction due to other internal prosthetic devices, implants and grafts, initial encounter: Secondary | ICD-10-CM | POA: Diagnosis not present

## 2019-09-13 DIAGNOSIS — Z992 Dependence on renal dialysis: Secondary | ICD-10-CM | POA: Diagnosis not present

## 2019-09-13 DIAGNOSIS — D509 Iron deficiency anemia, unspecified: Secondary | ICD-10-CM | POA: Diagnosis not present

## 2019-09-13 DIAGNOSIS — E876 Hypokalemia: Secondary | ICD-10-CM | POA: Diagnosis not present

## 2019-09-13 DIAGNOSIS — T8579XA Infection and inflammatory reaction due to other internal prosthetic devices, implants and grafts, initial encounter: Secondary | ICD-10-CM | POA: Diagnosis not present

## 2019-09-13 DIAGNOSIS — N186 End stage renal disease: Secondary | ICD-10-CM | POA: Diagnosis not present

## 2019-09-13 DIAGNOSIS — N2581 Secondary hyperparathyroidism of renal origin: Secondary | ICD-10-CM | POA: Diagnosis not present

## 2019-09-16 DIAGNOSIS — E876 Hypokalemia: Secondary | ICD-10-CM | POA: Diagnosis not present

## 2019-09-16 DIAGNOSIS — D509 Iron deficiency anemia, unspecified: Secondary | ICD-10-CM | POA: Diagnosis not present

## 2019-09-16 DIAGNOSIS — N2581 Secondary hyperparathyroidism of renal origin: Secondary | ICD-10-CM | POA: Diagnosis not present

## 2019-09-16 DIAGNOSIS — T8579XA Infection and inflammatory reaction due to other internal prosthetic devices, implants and grafts, initial encounter: Secondary | ICD-10-CM | POA: Diagnosis not present

## 2019-09-16 DIAGNOSIS — N186 End stage renal disease: Secondary | ICD-10-CM | POA: Diagnosis not present

## 2019-09-16 DIAGNOSIS — Z992 Dependence on renal dialysis: Secondary | ICD-10-CM | POA: Diagnosis not present

## 2019-09-17 DIAGNOSIS — Z992 Dependence on renal dialysis: Secondary | ICD-10-CM | POA: Diagnosis not present

## 2019-09-17 DIAGNOSIS — N186 End stage renal disease: Secondary | ICD-10-CM | POA: Diagnosis not present

## 2019-09-17 DIAGNOSIS — T82898D Other specified complication of vascular prosthetic devices, implants and grafts, subsequent encounter: Secondary | ICD-10-CM | POA: Diagnosis not present

## 2019-09-17 DIAGNOSIS — R200 Anesthesia of skin: Secondary | ICD-10-CM | POA: Diagnosis not present

## 2019-09-17 DIAGNOSIS — R202 Paresthesia of skin: Secondary | ICD-10-CM | POA: Diagnosis not present

## 2019-09-18 DIAGNOSIS — N186 End stage renal disease: Secondary | ICD-10-CM | POA: Diagnosis not present

## 2019-09-18 DIAGNOSIS — T8579XA Infection and inflammatory reaction due to other internal prosthetic devices, implants and grafts, initial encounter: Secondary | ICD-10-CM | POA: Diagnosis not present

## 2019-09-18 DIAGNOSIS — E876 Hypokalemia: Secondary | ICD-10-CM | POA: Diagnosis not present

## 2019-09-18 DIAGNOSIS — E1129 Type 2 diabetes mellitus with other diabetic kidney complication: Secondary | ICD-10-CM | POA: Diagnosis not present

## 2019-09-18 DIAGNOSIS — D509 Iron deficiency anemia, unspecified: Secondary | ICD-10-CM | POA: Diagnosis not present

## 2019-09-18 DIAGNOSIS — N2581 Secondary hyperparathyroidism of renal origin: Secondary | ICD-10-CM | POA: Diagnosis not present

## 2019-09-18 DIAGNOSIS — Z992 Dependence on renal dialysis: Secondary | ICD-10-CM | POA: Diagnosis not present

## 2019-09-20 DIAGNOSIS — T8579XA Infection and inflammatory reaction due to other internal prosthetic devices, implants and grafts, initial encounter: Secondary | ICD-10-CM | POA: Diagnosis not present

## 2019-09-20 DIAGNOSIS — Z992 Dependence on renal dialysis: Secondary | ICD-10-CM | POA: Diagnosis not present

## 2019-09-20 DIAGNOSIS — N186 End stage renal disease: Secondary | ICD-10-CM | POA: Diagnosis not present

## 2019-09-20 DIAGNOSIS — N2581 Secondary hyperparathyroidism of renal origin: Secondary | ICD-10-CM | POA: Diagnosis not present

## 2019-09-20 DIAGNOSIS — D509 Iron deficiency anemia, unspecified: Secondary | ICD-10-CM | POA: Diagnosis not present

## 2019-09-20 DIAGNOSIS — E876 Hypokalemia: Secondary | ICD-10-CM | POA: Diagnosis not present

## 2019-09-23 DIAGNOSIS — T8579XA Infection and inflammatory reaction due to other internal prosthetic devices, implants and grafts, initial encounter: Secondary | ICD-10-CM | POA: Diagnosis not present

## 2019-09-23 DIAGNOSIS — N2581 Secondary hyperparathyroidism of renal origin: Secondary | ICD-10-CM | POA: Diagnosis not present

## 2019-09-23 DIAGNOSIS — N186 End stage renal disease: Secondary | ICD-10-CM | POA: Diagnosis not present

## 2019-09-23 DIAGNOSIS — D509 Iron deficiency anemia, unspecified: Secondary | ICD-10-CM | POA: Diagnosis not present

## 2019-09-23 DIAGNOSIS — E876 Hypokalemia: Secondary | ICD-10-CM | POA: Diagnosis not present

## 2019-09-23 DIAGNOSIS — Z992 Dependence on renal dialysis: Secondary | ICD-10-CM | POA: Diagnosis not present

## 2019-09-24 DIAGNOSIS — G63 Polyneuropathy in diseases classified elsewhere: Secondary | ICD-10-CM | POA: Diagnosis not present

## 2019-09-24 DIAGNOSIS — G959 Disease of spinal cord, unspecified: Secondary | ICD-10-CM | POA: Diagnosis not present

## 2019-09-24 DIAGNOSIS — M542 Cervicalgia: Secondary | ICD-10-CM | POA: Diagnosis not present

## 2019-09-25 DIAGNOSIS — Z992 Dependence on renal dialysis: Secondary | ICD-10-CM | POA: Diagnosis not present

## 2019-09-25 DIAGNOSIS — D509 Iron deficiency anemia, unspecified: Secondary | ICD-10-CM | POA: Diagnosis not present

## 2019-09-25 DIAGNOSIS — E876 Hypokalemia: Secondary | ICD-10-CM | POA: Diagnosis not present

## 2019-09-25 DIAGNOSIS — N2581 Secondary hyperparathyroidism of renal origin: Secondary | ICD-10-CM | POA: Diagnosis not present

## 2019-09-25 DIAGNOSIS — N186 End stage renal disease: Secondary | ICD-10-CM | POA: Diagnosis not present

## 2019-09-25 DIAGNOSIS — T8579XA Infection and inflammatory reaction due to other internal prosthetic devices, implants and grafts, initial encounter: Secondary | ICD-10-CM | POA: Diagnosis not present

## 2019-09-26 DIAGNOSIS — H04223 Epiphora due to insufficient drainage, bilateral lacrimal glands: Secondary | ICD-10-CM | POA: Diagnosis not present

## 2019-09-26 DIAGNOSIS — H11823 Conjunctivochalasis, bilateral: Secondary | ICD-10-CM | POA: Diagnosis not present

## 2019-09-26 DIAGNOSIS — H4063X0 Glaucoma secondary to drugs, bilateral, stage unspecified: Secondary | ICD-10-CM | POA: Diagnosis not present

## 2019-09-26 DIAGNOSIS — H43813 Vitreous degeneration, bilateral: Secondary | ICD-10-CM | POA: Diagnosis not present

## 2019-09-27 DIAGNOSIS — N186 End stage renal disease: Secondary | ICD-10-CM | POA: Diagnosis not present

## 2019-09-27 DIAGNOSIS — E876 Hypokalemia: Secondary | ICD-10-CM | POA: Diagnosis not present

## 2019-09-27 DIAGNOSIS — D509 Iron deficiency anemia, unspecified: Secondary | ICD-10-CM | POA: Diagnosis not present

## 2019-09-27 DIAGNOSIS — Z992 Dependence on renal dialysis: Secondary | ICD-10-CM | POA: Diagnosis not present

## 2019-09-27 DIAGNOSIS — N2581 Secondary hyperparathyroidism of renal origin: Secondary | ICD-10-CM | POA: Diagnosis not present

## 2019-09-27 DIAGNOSIS — T8579XA Infection and inflammatory reaction due to other internal prosthetic devices, implants and grafts, initial encounter: Secondary | ICD-10-CM | POA: Diagnosis not present

## 2019-09-30 DIAGNOSIS — N186 End stage renal disease: Secondary | ICD-10-CM | POA: Diagnosis not present

## 2019-09-30 DIAGNOSIS — T8579XA Infection and inflammatory reaction due to other internal prosthetic devices, implants and grafts, initial encounter: Secondary | ICD-10-CM | POA: Diagnosis not present

## 2019-09-30 DIAGNOSIS — N2581 Secondary hyperparathyroidism of renal origin: Secondary | ICD-10-CM | POA: Diagnosis not present

## 2019-09-30 DIAGNOSIS — Z992 Dependence on renal dialysis: Secondary | ICD-10-CM | POA: Diagnosis not present

## 2019-09-30 DIAGNOSIS — D509 Iron deficiency anemia, unspecified: Secondary | ICD-10-CM | POA: Diagnosis not present

## 2019-09-30 DIAGNOSIS — E876 Hypokalemia: Secondary | ICD-10-CM | POA: Diagnosis not present

## 2019-10-02 DIAGNOSIS — N2581 Secondary hyperparathyroidism of renal origin: Secondary | ICD-10-CM | POA: Diagnosis not present

## 2019-10-02 DIAGNOSIS — N186 End stage renal disease: Secondary | ICD-10-CM | POA: Diagnosis not present

## 2019-10-02 DIAGNOSIS — E876 Hypokalemia: Secondary | ICD-10-CM | POA: Diagnosis not present

## 2019-10-02 DIAGNOSIS — T8579XA Infection and inflammatory reaction due to other internal prosthetic devices, implants and grafts, initial encounter: Secondary | ICD-10-CM | POA: Diagnosis not present

## 2019-10-02 DIAGNOSIS — Z992 Dependence on renal dialysis: Secondary | ICD-10-CM | POA: Diagnosis not present

## 2019-10-02 DIAGNOSIS — D509 Iron deficiency anemia, unspecified: Secondary | ICD-10-CM | POA: Diagnosis not present

## 2019-10-03 DIAGNOSIS — M6281 Muscle weakness (generalized): Secondary | ICD-10-CM | POA: Diagnosis not present

## 2019-10-03 DIAGNOSIS — R269 Unspecified abnormalities of gait and mobility: Secondary | ICD-10-CM | POA: Diagnosis not present

## 2019-10-03 DIAGNOSIS — M542 Cervicalgia: Secondary | ICD-10-CM | POA: Diagnosis not present

## 2019-10-03 DIAGNOSIS — M545 Low back pain: Secondary | ICD-10-CM | POA: Diagnosis not present

## 2019-10-04 DIAGNOSIS — T8579XA Infection and inflammatory reaction due to other internal prosthetic devices, implants and grafts, initial encounter: Secondary | ICD-10-CM | POA: Diagnosis not present

## 2019-10-04 DIAGNOSIS — Z992 Dependence on renal dialysis: Secondary | ICD-10-CM | POA: Diagnosis not present

## 2019-10-04 DIAGNOSIS — D509 Iron deficiency anemia, unspecified: Secondary | ICD-10-CM | POA: Diagnosis not present

## 2019-10-04 DIAGNOSIS — E876 Hypokalemia: Secondary | ICD-10-CM | POA: Diagnosis not present

## 2019-10-04 DIAGNOSIS — N2581 Secondary hyperparathyroidism of renal origin: Secondary | ICD-10-CM | POA: Diagnosis not present

## 2019-10-04 DIAGNOSIS — N186 End stage renal disease: Secondary | ICD-10-CM | POA: Diagnosis not present

## 2019-10-05 DIAGNOSIS — Z992 Dependence on renal dialysis: Secondary | ICD-10-CM | POA: Diagnosis not present

## 2019-10-05 DIAGNOSIS — N186 End stage renal disease: Secondary | ICD-10-CM | POA: Diagnosis not present

## 2019-10-05 DIAGNOSIS — T86819 Unspecified complication of lung transplant: Secondary | ICD-10-CM | POA: Diagnosis not present

## 2019-10-07 DIAGNOSIS — E876 Hypokalemia: Secondary | ICD-10-CM | POA: Diagnosis not present

## 2019-10-07 DIAGNOSIS — T8579XA Infection and inflammatory reaction due to other internal prosthetic devices, implants and grafts, initial encounter: Secondary | ICD-10-CM | POA: Diagnosis not present

## 2019-10-07 DIAGNOSIS — Z992 Dependence on renal dialysis: Secondary | ICD-10-CM | POA: Diagnosis not present

## 2019-10-07 DIAGNOSIS — N2581 Secondary hyperparathyroidism of renal origin: Secondary | ICD-10-CM | POA: Diagnosis not present

## 2019-10-07 DIAGNOSIS — N186 End stage renal disease: Secondary | ICD-10-CM | POA: Diagnosis not present

## 2019-10-09 DIAGNOSIS — N186 End stage renal disease: Secondary | ICD-10-CM | POA: Diagnosis not present

## 2019-10-09 DIAGNOSIS — N2581 Secondary hyperparathyroidism of renal origin: Secondary | ICD-10-CM | POA: Diagnosis not present

## 2019-10-09 DIAGNOSIS — Z992 Dependence on renal dialysis: Secondary | ICD-10-CM | POA: Diagnosis not present

## 2019-10-09 DIAGNOSIS — E876 Hypokalemia: Secondary | ICD-10-CM | POA: Diagnosis not present

## 2019-10-09 DIAGNOSIS — T8579XA Infection and inflammatory reaction due to other internal prosthetic devices, implants and grafts, initial encounter: Secondary | ICD-10-CM | POA: Diagnosis not present

## 2019-10-10 DIAGNOSIS — T86819 Unspecified complication of lung transplant: Secondary | ICD-10-CM | POA: Diagnosis not present

## 2019-10-10 DIAGNOSIS — Z5181 Encounter for therapeutic drug level monitoring: Secondary | ICD-10-CM | POA: Diagnosis not present

## 2019-10-10 DIAGNOSIS — R269 Unspecified abnormalities of gait and mobility: Secondary | ICD-10-CM | POA: Diagnosis not present

## 2019-10-10 DIAGNOSIS — M6281 Muscle weakness (generalized): Secondary | ICD-10-CM | POA: Diagnosis not present

## 2019-10-10 DIAGNOSIS — M542 Cervicalgia: Secondary | ICD-10-CM | POA: Diagnosis not present

## 2019-10-10 DIAGNOSIS — M545 Low back pain: Secondary | ICD-10-CM | POA: Diagnosis not present

## 2019-10-10 DIAGNOSIS — Z79899 Other long term (current) drug therapy: Secondary | ICD-10-CM | POA: Diagnosis not present

## 2019-10-10 DIAGNOSIS — Z298 Encounter for other specified prophylactic measures: Secondary | ICD-10-CM | POA: Diagnosis not present

## 2019-10-10 DIAGNOSIS — Z942 Lung transplant status: Secondary | ICD-10-CM | POA: Diagnosis not present

## 2019-10-11 DIAGNOSIS — N2581 Secondary hyperparathyroidism of renal origin: Secondary | ICD-10-CM | POA: Diagnosis not present

## 2019-10-11 DIAGNOSIS — E876 Hypokalemia: Secondary | ICD-10-CM | POA: Diagnosis not present

## 2019-10-11 DIAGNOSIS — Z992 Dependence on renal dialysis: Secondary | ICD-10-CM | POA: Diagnosis not present

## 2019-10-11 DIAGNOSIS — T8579XA Infection and inflammatory reaction due to other internal prosthetic devices, implants and grafts, initial encounter: Secondary | ICD-10-CM | POA: Diagnosis not present

## 2019-10-11 DIAGNOSIS — N186 End stage renal disease: Secondary | ICD-10-CM | POA: Diagnosis not present

## 2019-10-14 DIAGNOSIS — N186 End stage renal disease: Secondary | ICD-10-CM | POA: Diagnosis not present

## 2019-10-14 DIAGNOSIS — N2581 Secondary hyperparathyroidism of renal origin: Secondary | ICD-10-CM | POA: Diagnosis not present

## 2019-10-14 DIAGNOSIS — Z992 Dependence on renal dialysis: Secondary | ICD-10-CM | POA: Diagnosis not present

## 2019-10-14 DIAGNOSIS — E876 Hypokalemia: Secondary | ICD-10-CM | POA: Diagnosis not present

## 2019-10-14 DIAGNOSIS — T8579XA Infection and inflammatory reaction due to other internal prosthetic devices, implants and grafts, initial encounter: Secondary | ICD-10-CM | POA: Diagnosis not present

## 2019-10-16 DIAGNOSIS — Z992 Dependence on renal dialysis: Secondary | ICD-10-CM | POA: Diagnosis not present

## 2019-10-16 DIAGNOSIS — T8579XA Infection and inflammatory reaction due to other internal prosthetic devices, implants and grafts, initial encounter: Secondary | ICD-10-CM | POA: Diagnosis not present

## 2019-10-16 DIAGNOSIS — E876 Hypokalemia: Secondary | ICD-10-CM | POA: Diagnosis not present

## 2019-10-16 DIAGNOSIS — N186 End stage renal disease: Secondary | ICD-10-CM | POA: Diagnosis not present

## 2019-10-16 DIAGNOSIS — N2581 Secondary hyperparathyroidism of renal origin: Secondary | ICD-10-CM | POA: Diagnosis not present

## 2019-10-17 DIAGNOSIS — R269 Unspecified abnormalities of gait and mobility: Secondary | ICD-10-CM | POA: Diagnosis not present

## 2019-10-17 DIAGNOSIS — M6281 Muscle weakness (generalized): Secondary | ICD-10-CM | POA: Diagnosis not present

## 2019-10-17 DIAGNOSIS — M542 Cervicalgia: Secondary | ICD-10-CM | POA: Diagnosis not present

## 2019-10-17 DIAGNOSIS — M545 Low back pain: Secondary | ICD-10-CM | POA: Diagnosis not present

## 2019-10-18 DIAGNOSIS — Z992 Dependence on renal dialysis: Secondary | ICD-10-CM | POA: Diagnosis not present

## 2019-10-18 DIAGNOSIS — E876 Hypokalemia: Secondary | ICD-10-CM | POA: Diagnosis not present

## 2019-10-18 DIAGNOSIS — T8579XA Infection and inflammatory reaction due to other internal prosthetic devices, implants and grafts, initial encounter: Secondary | ICD-10-CM | POA: Diagnosis not present

## 2019-10-18 DIAGNOSIS — N2581 Secondary hyperparathyroidism of renal origin: Secondary | ICD-10-CM | POA: Diagnosis not present

## 2019-10-18 DIAGNOSIS — N186 End stage renal disease: Secondary | ICD-10-CM | POA: Diagnosis not present

## 2019-10-21 DIAGNOSIS — Z992 Dependence on renal dialysis: Secondary | ICD-10-CM | POA: Diagnosis not present

## 2019-10-21 DIAGNOSIS — T8579XA Infection and inflammatory reaction due to other internal prosthetic devices, implants and grafts, initial encounter: Secondary | ICD-10-CM | POA: Diagnosis not present

## 2019-10-21 DIAGNOSIS — E876 Hypokalemia: Secondary | ICD-10-CM | POA: Diagnosis not present

## 2019-10-21 DIAGNOSIS — N2581 Secondary hyperparathyroidism of renal origin: Secondary | ICD-10-CM | POA: Diagnosis not present

## 2019-10-21 DIAGNOSIS — N186 End stage renal disease: Secondary | ICD-10-CM | POA: Diagnosis not present

## 2019-10-22 DIAGNOSIS — M6281 Muscle weakness (generalized): Secondary | ICD-10-CM | POA: Diagnosis not present

## 2019-10-22 DIAGNOSIS — R269 Unspecified abnormalities of gait and mobility: Secondary | ICD-10-CM | POA: Diagnosis not present

## 2019-10-22 DIAGNOSIS — M545 Low back pain: Secondary | ICD-10-CM | POA: Diagnosis not present

## 2019-10-22 DIAGNOSIS — M542 Cervicalgia: Secondary | ICD-10-CM | POA: Diagnosis not present

## 2019-10-23 DIAGNOSIS — E876 Hypokalemia: Secondary | ICD-10-CM | POA: Diagnosis not present

## 2019-10-23 DIAGNOSIS — N2581 Secondary hyperparathyroidism of renal origin: Secondary | ICD-10-CM | POA: Diagnosis not present

## 2019-10-23 DIAGNOSIS — Z992 Dependence on renal dialysis: Secondary | ICD-10-CM | POA: Diagnosis not present

## 2019-10-23 DIAGNOSIS — T8579XA Infection and inflammatory reaction due to other internal prosthetic devices, implants and grafts, initial encounter: Secondary | ICD-10-CM | POA: Diagnosis not present

## 2019-10-23 DIAGNOSIS — N186 End stage renal disease: Secondary | ICD-10-CM | POA: Diagnosis not present

## 2019-10-24 DIAGNOSIS — Z79899 Other long term (current) drug therapy: Secondary | ICD-10-CM | POA: Diagnosis not present

## 2019-10-24 DIAGNOSIS — Z942 Lung transplant status: Secondary | ICD-10-CM | POA: Diagnosis not present

## 2019-10-24 DIAGNOSIS — M542 Cervicalgia: Secondary | ICD-10-CM | POA: Diagnosis not present

## 2019-10-24 DIAGNOSIS — R269 Unspecified abnormalities of gait and mobility: Secondary | ICD-10-CM | POA: Diagnosis not present

## 2019-10-24 DIAGNOSIS — M6281 Muscle weakness (generalized): Secondary | ICD-10-CM | POA: Diagnosis not present

## 2019-10-24 DIAGNOSIS — M545 Low back pain: Secondary | ICD-10-CM | POA: Diagnosis not present

## 2019-10-24 DIAGNOSIS — T86819 Unspecified complication of lung transplant: Secondary | ICD-10-CM | POA: Diagnosis not present

## 2019-10-24 DIAGNOSIS — Z5181 Encounter for therapeutic drug level monitoring: Secondary | ICD-10-CM | POA: Diagnosis not present

## 2019-10-24 DIAGNOSIS — Z298 Encounter for other specified prophylactic measures: Secondary | ICD-10-CM | POA: Diagnosis not present

## 2019-10-25 DIAGNOSIS — N2581 Secondary hyperparathyroidism of renal origin: Secondary | ICD-10-CM | POA: Diagnosis not present

## 2019-10-25 DIAGNOSIS — Z992 Dependence on renal dialysis: Secondary | ICD-10-CM | POA: Diagnosis not present

## 2019-10-25 DIAGNOSIS — N186 End stage renal disease: Secondary | ICD-10-CM | POA: Diagnosis not present

## 2019-10-25 DIAGNOSIS — T8579XA Infection and inflammatory reaction due to other internal prosthetic devices, implants and grafts, initial encounter: Secondary | ICD-10-CM | POA: Diagnosis not present

## 2019-10-25 DIAGNOSIS — E876 Hypokalemia: Secondary | ICD-10-CM | POA: Diagnosis not present

## 2019-10-28 DIAGNOSIS — Z992 Dependence on renal dialysis: Secondary | ICD-10-CM | POA: Diagnosis not present

## 2019-10-28 DIAGNOSIS — N2581 Secondary hyperparathyroidism of renal origin: Secondary | ICD-10-CM | POA: Diagnosis not present

## 2019-10-28 DIAGNOSIS — T8579XA Infection and inflammatory reaction due to other internal prosthetic devices, implants and grafts, initial encounter: Secondary | ICD-10-CM | POA: Diagnosis not present

## 2019-10-28 DIAGNOSIS — N186 End stage renal disease: Secondary | ICD-10-CM | POA: Diagnosis not present

## 2019-10-28 DIAGNOSIS — E876 Hypokalemia: Secondary | ICD-10-CM | POA: Diagnosis not present

## 2019-10-30 DIAGNOSIS — T8579XA Infection and inflammatory reaction due to other internal prosthetic devices, implants and grafts, initial encounter: Secondary | ICD-10-CM | POA: Diagnosis not present

## 2019-10-30 DIAGNOSIS — N2581 Secondary hyperparathyroidism of renal origin: Secondary | ICD-10-CM | POA: Diagnosis not present

## 2019-10-30 DIAGNOSIS — E876 Hypokalemia: Secondary | ICD-10-CM | POA: Diagnosis not present

## 2019-10-30 DIAGNOSIS — Z992 Dependence on renal dialysis: Secondary | ICD-10-CM | POA: Diagnosis not present

## 2019-10-30 DIAGNOSIS — N186 End stage renal disease: Secondary | ICD-10-CM | POA: Diagnosis not present

## 2019-10-31 DIAGNOSIS — M6281 Muscle weakness (generalized): Secondary | ICD-10-CM | POA: Diagnosis not present

## 2019-10-31 DIAGNOSIS — M545 Low back pain: Secondary | ICD-10-CM | POA: Diagnosis not present

## 2019-10-31 DIAGNOSIS — M542 Cervicalgia: Secondary | ICD-10-CM | POA: Diagnosis not present

## 2019-10-31 DIAGNOSIS — R269 Unspecified abnormalities of gait and mobility: Secondary | ICD-10-CM | POA: Diagnosis not present

## 2019-11-01 DIAGNOSIS — T8579XA Infection and inflammatory reaction due to other internal prosthetic devices, implants and grafts, initial encounter: Secondary | ICD-10-CM | POA: Diagnosis not present

## 2019-11-01 DIAGNOSIS — N186 End stage renal disease: Secondary | ICD-10-CM | POA: Diagnosis not present

## 2019-11-01 DIAGNOSIS — N2581 Secondary hyperparathyroidism of renal origin: Secondary | ICD-10-CM | POA: Diagnosis not present

## 2019-11-01 DIAGNOSIS — E876 Hypokalemia: Secondary | ICD-10-CM | POA: Diagnosis not present

## 2019-11-01 DIAGNOSIS — Z992 Dependence on renal dialysis: Secondary | ICD-10-CM | POA: Diagnosis not present

## 2019-11-04 DIAGNOSIS — T8579XA Infection and inflammatory reaction due to other internal prosthetic devices, implants and grafts, initial encounter: Secondary | ICD-10-CM | POA: Diagnosis not present

## 2019-11-04 DIAGNOSIS — E876 Hypokalemia: Secondary | ICD-10-CM | POA: Diagnosis not present

## 2019-11-04 DIAGNOSIS — N186 End stage renal disease: Secondary | ICD-10-CM | POA: Diagnosis not present

## 2019-11-04 DIAGNOSIS — N2581 Secondary hyperparathyroidism of renal origin: Secondary | ICD-10-CM | POA: Diagnosis not present

## 2019-11-04 DIAGNOSIS — Z992 Dependence on renal dialysis: Secondary | ICD-10-CM | POA: Diagnosis not present

## 2019-11-07 DIAGNOSIS — M545 Low back pain: Secondary | ICD-10-CM | POA: Diagnosis not present

## 2019-11-07 DIAGNOSIS — R269 Unspecified abnormalities of gait and mobility: Secondary | ICD-10-CM | POA: Diagnosis not present

## 2019-11-07 DIAGNOSIS — M542 Cervicalgia: Secondary | ICD-10-CM | POA: Diagnosis not present

## 2019-11-07 DIAGNOSIS — M6281 Muscle weakness (generalized): Secondary | ICD-10-CM | POA: Diagnosis not present

## 2019-11-12 DIAGNOSIS — I951 Orthostatic hypotension: Secondary | ICD-10-CM | POA: Diagnosis present

## 2019-11-12 DIAGNOSIS — R112 Nausea with vomiting, unspecified: Secondary | ICD-10-CM | POA: Diagnosis not present

## 2019-11-12 DIAGNOSIS — N186 End stage renal disease: Secondary | ICD-10-CM | POA: Diagnosis present

## 2019-11-12 DIAGNOSIS — Z942 Lung transplant status: Secondary | ICD-10-CM | POA: Diagnosis not present

## 2019-11-12 DIAGNOSIS — Z7982 Long term (current) use of aspirin: Secondary | ICD-10-CM | POA: Diagnosis not present

## 2019-11-12 DIAGNOSIS — G252 Other specified forms of tremor: Secondary | ICD-10-CM | POA: Diagnosis present

## 2019-11-12 DIAGNOSIS — N452 Orchitis: Secondary | ICD-10-CM | POA: Diagnosis not present

## 2019-11-12 DIAGNOSIS — K1120 Sialoadenitis, unspecified: Secondary | ICD-10-CM | POA: Diagnosis present

## 2019-11-12 DIAGNOSIS — Z992 Dependence on renal dialysis: Secondary | ICD-10-CM | POA: Diagnosis not present

## 2019-11-12 DIAGNOSIS — N250 Renal osteodystrophy: Secondary | ICD-10-CM | POA: Diagnosis present

## 2019-11-12 DIAGNOSIS — I6523 Occlusion and stenosis of bilateral carotid arteries: Secondary | ICD-10-CM | POA: Diagnosis not present

## 2019-11-12 DIAGNOSIS — N50811 Right testicular pain: Secondary | ICD-10-CM | POA: Diagnosis not present

## 2019-11-12 DIAGNOSIS — M5000 Cervical disc disorder with myelopathy, unspecified cervical region: Secondary | ICD-10-CM | POA: Diagnosis present

## 2019-11-12 DIAGNOSIS — N453 Epididymo-orchitis: Secondary | ICD-10-CM | POA: Diagnosis present

## 2019-11-12 DIAGNOSIS — R5381 Other malaise: Secondary | ICD-10-CM | POA: Diagnosis not present

## 2019-11-12 DIAGNOSIS — Z86718 Personal history of other venous thrombosis and embolism: Secondary | ICD-10-CM | POA: Diagnosis not present

## 2019-11-12 DIAGNOSIS — R519 Headache, unspecified: Secondary | ICD-10-CM | POA: Diagnosis not present

## 2019-11-12 DIAGNOSIS — Z794 Long term (current) use of insulin: Secondary | ICD-10-CM | POA: Diagnosis not present

## 2019-11-12 DIAGNOSIS — I4510 Unspecified right bundle-branch block: Secondary | ICD-10-CM | POA: Diagnosis present

## 2019-11-12 DIAGNOSIS — E1122 Type 2 diabetes mellitus with diabetic chronic kidney disease: Secondary | ICD-10-CM | POA: Diagnosis present

## 2019-11-12 DIAGNOSIS — R42 Dizziness and giddiness: Secondary | ICD-10-CM | POA: Diagnosis present

## 2019-11-12 DIAGNOSIS — K219 Gastro-esophageal reflux disease without esophagitis: Secondary | ICD-10-CM | POA: Diagnosis present

## 2019-11-12 DIAGNOSIS — K118 Other diseases of salivary glands: Secondary | ICD-10-CM | POA: Diagnosis not present

## 2019-11-12 DIAGNOSIS — N5082 Scrotal pain: Secondary | ICD-10-CM | POA: Diagnosis not present

## 2019-11-12 DIAGNOSIS — Z79899 Other long term (current) drug therapy: Secondary | ICD-10-CM | POA: Diagnosis not present

## 2019-11-12 DIAGNOSIS — I9589 Other hypotension: Secondary | ICD-10-CM | POA: Diagnosis not present

## 2019-11-12 DIAGNOSIS — E785 Hyperlipidemia, unspecified: Secondary | ICD-10-CM | POA: Diagnosis present

## 2019-11-12 DIAGNOSIS — M79662 Pain in left lower leg: Secondary | ICD-10-CM | POA: Diagnosis present

## 2019-11-12 DIAGNOSIS — Z881 Allergy status to other antibiotic agents status: Secondary | ICD-10-CM | POA: Diagnosis not present

## 2019-11-12 DIAGNOSIS — I120 Hypertensive chronic kidney disease with stage 5 chronic kidney disease or end stage renal disease: Secondary | ICD-10-CM | POA: Diagnosis present

## 2019-11-12 DIAGNOSIS — D849 Immunodeficiency, unspecified: Secondary | ICD-10-CM | POA: Diagnosis not present

## 2019-11-12 DIAGNOSIS — Z87891 Personal history of nicotine dependence: Secondary | ICD-10-CM | POA: Diagnosis not present

## 2019-11-12 DIAGNOSIS — D631 Anemia in chronic kidney disease: Secondary | ICD-10-CM | POA: Diagnosis present

## 2019-11-12 DIAGNOSIS — Z20822 Contact with and (suspected) exposure to covid-19: Secondary | ICD-10-CM | POA: Diagnosis present

## 2019-11-12 DIAGNOSIS — N50812 Left testicular pain: Secondary | ICD-10-CM | POA: Diagnosis not present

## 2019-11-12 DIAGNOSIS — N5089 Other specified disorders of the male genital organs: Secondary | ICD-10-CM | POA: Diagnosis not present

## 2019-11-12 DIAGNOSIS — R5383 Other fatigue: Secondary | ICD-10-CM | POA: Diagnosis not present

## 2019-11-12 DIAGNOSIS — D801 Nonfamilial hypogammaglobulinemia: Secondary | ICD-10-CM | POA: Diagnosis not present

## 2019-11-19 DIAGNOSIS — R269 Unspecified abnormalities of gait and mobility: Secondary | ICD-10-CM | POA: Diagnosis not present

## 2019-11-19 DIAGNOSIS — M6281 Muscle weakness (generalized): Secondary | ICD-10-CM | POA: Diagnosis not present

## 2019-11-19 DIAGNOSIS — M545 Low back pain: Secondary | ICD-10-CM | POA: Diagnosis not present

## 2019-11-19 DIAGNOSIS — M542 Cervicalgia: Secondary | ICD-10-CM | POA: Diagnosis not present

## 2019-11-21 DIAGNOSIS — M545 Low back pain: Secondary | ICD-10-CM | POA: Diagnosis not present

## 2019-11-21 DIAGNOSIS — R269 Unspecified abnormalities of gait and mobility: Secondary | ICD-10-CM | POA: Diagnosis not present

## 2019-11-21 DIAGNOSIS — M542 Cervicalgia: Secondary | ICD-10-CM | POA: Diagnosis not present

## 2019-11-21 DIAGNOSIS — M6281 Muscle weakness (generalized): Secondary | ICD-10-CM | POA: Diagnosis not present

## 2019-11-26 ENCOUNTER — Ambulatory Visit (INDEPENDENT_AMBULATORY_CARE_PROVIDER_SITE_OTHER): Payer: Medicare Other | Admitting: Physician Assistant

## 2019-11-26 ENCOUNTER — Other Ambulatory Visit: Payer: Self-pay

## 2019-11-26 VITALS — BP 120/72 | HR 76 | Temp 97.4°F | Resp 20 | Ht 63.0 in | Wt 152.6 lb

## 2019-11-26 DIAGNOSIS — Z992 Dependence on renal dialysis: Secondary | ICD-10-CM | POA: Diagnosis not present

## 2019-11-26 DIAGNOSIS — N186 End stage renal disease: Secondary | ICD-10-CM

## 2019-11-26 NOTE — Progress Notes (Signed)
Established Dialysis Access   History of Present Illness   [name] is a 69 y.o. ([birth year]) male who presents for re-evaluation of permanent access.  Left brachiocephalic fistula was initially created at [facility] in 2017.  He then had a fistula revision by Dr. Carlis Abbott in August 2020.  He then had another fistula revision at [facility] for ulcerations overlying fistula in March 2021.  He was again referred back to clinic due to thinning skin overlying fistula.  He denies any bleeding episodes.  He is also complaining of some numbness and tingling running down his forearm into his hand that has worsened since last revision.  He is completing dialysis treatments without complication on a Monday Wednesday Friday schedule at [facility].  The patient and his wife are more frustrated about the adjustments made to his dry weight however plan to discuss this tomorrow with Dr. Carolin Sicks.   The patient's PMH, PSH, SH, and FamHx were reviewed and are unchanged from prior visit.  Current Outpatient Medications  Medication Sig Dispense Refill  . cycloSPORINE modified (NEORAL) 25 MG capsule Take 2 capsules by mouth (50mg ) in the morning, and 3 capsules by mouth (75mg ) in the evening.    . midodrine (PROAMATINE) 5 MG tablet Take by mouth.    Marland Kitchen ACCU-CHEK FASTCLIX LANCETS MISC As directed up to 4 times daily  3  . acetaminophen (TYLENOL) 500 MG tablet Take 500-1,000 mg by mouth every 6 (six) hours as needed for moderate pain or headache.     Ilean Skill Lipoic Acid 200 MG CAPS Take 1 capsule by mouth in the morning and at bedtime.    Marland Kitchen aspirin 81 MG tablet Take 81 mg by mouth daily.     Marland Kitchen azaTHIOprine (IMURAN) 50 MG tablet Take 25 mg by mouth See admin instructions. Take 25mg  by mouth on Monday, Wednesday, and Friday after dialysis    . calcium acetate (PHOSLO) 667 MG capsule Take 1,334 mg by mouth 3 (three) times daily before meals.     . cycloSPORINE  (SANDIMMUNE) 25 MG capsule Take 50 mg by mouth 2 (two) times daily. Take 50 mg by mouth in the morning and 50 mg by mouth at night    . dorzolamidel-timolol (COSOPT PF) 22.3-6.8 MG/ML SOLN ophthalmic solution Place 1 drop into both eyes 2 (two) times daily.    . fluticasone (FLONASE) 50 MCG/ACT nasal spray Place 2 sprays into the nose daily. (Patient taking differently: Place 2 sprays into the nose daily as needed for allergies. ) 30 g 2  . Immune Globulin 10% (PRIVIGEN) 20 GM/200ML SOLN Inject 0.5 g/kg into the vein every 3 (three) months.     . insulin regular (NOVOLIN R) 100 units/mL injection Inject 5 Units into the skin 3 (three) times daily before meals. Additional units (per sliding scale): BGL 201-250 = 1 unit; 251-300 = 2 units; 301-350 = 3 units; 351-400 = units; >401 = 5 units + CALL LUNG COORDINATOR @ [facility]    . loperamide (IMODIUM) 2 MG capsule Take 2 mg by mouth 3 (three) times daily as needed.    . Multiple Vitamin (MULTIVITAMIN) capsule Take 1 capsule by mouth as directed. Take only on (Sun, Tues, Thurs, Sat)    . omeprazole (PRILOSEC) 40 MG capsule Take 40 mg by mouth in the morning and at bedtime.     Marland Kitchen oxyCODONE (OXY IR/ROXICODONE) 5 MG immediate release tablet Take 5 mg by mouth every 6 (six)  hours as needed for moderate pain or severe pain.     . polyvinyl alcohol-povidone (REFRESH) 1.4-0.6 % ophthalmic solution Place 1-2 drops into both eyes daily as needed (for dryness).    . posaconazole (NOXAFIL) 100 MG TBEC delayed-release tablet Take 300 mg by mouth daily. 3 tablets daily at dinner.    . pravastatin (PRAVACHOL) 20 MG tablet Take 20 mg by mouth at bedtime.     . prednisoLONE acetate (PRED FORTE) 1 % ophthalmic suspension Place 1 drop into both eyes in the morning and at bedtime.     . predniSONE (DELTASONE) 5 MG tablet Take 5 mg by mouth daily with breakfast.    . pyridOXINE (VITAMIN B-6) 100 MG tablet Take 100 mg by mouth daily.    Marland Kitchen sulfamethoxazole-trimethoprim  (BACTRIM,SEPTRA) 400-80 MG tablet Take 1 tablet by mouth See admin instructions. Take one tablet by mouth every Monday, Wednesday, and Friday after dialysis    . traMADol (ULTRAM) 50 MG tablet SMARTSIG:0.5 Tablet(s) By Mouth Every 12 Hours PRN    . valGANciclovir (VALCYTE) 450 MG tablet Take 1 tablet by mouth See admin instructions. Take 1 tablet by mouth every Monday and Friday  After dialysis     No current facility-administered medications for this visit.    REVIEW OF SYSTEMS (negative unless checked):   Cardiac:  []  Chest pain or chest pressure? []  Shortness of breath upon activity? []  Shortness of breath when lying flat? []  Irregular heart rhythm?  Vascular:  []  Pain in calf, thigh, or hip brought on by walking? []  Pain in feet at night that wakes you up from your sleep? []  Blood clot in your veins? []  Leg swelling?  Pulmonary:  []  Oxygen at home? []  Productive cough? []  Wheezing?  Neurologic:  []  Sudden weakness in arms or legs? []  Sudden numbness in arms or legs? []  Sudden onset of difficult speaking or slurred speech? []  Temporary loss of vision in one eye? []  Problems with dizziness?  Gastrointestinal:  []  Blood in stool? []  Vomited blood?  Genitourinary:  []  Burning when urinating? []  Blood in urine?  Psychiatric:  []  Major depression  Hematologic:  []  Bleeding problems? []  Problems with blood clotting?  Dermatologic:  []  Rashes or ulcers?  Constitutional:  []  Fever or chills?  Ear/Nose/Throat:  []  Change in hearing? []  Nose bleeds? []  Sore throat?  Musculoskeletal:  []  Back pain? []  Joint pain? []  Muscle pain?   Physical Examination   Vitals:   11/26/19 1532  BP: 120/72  Pulse: 76  Resp: 20  Temp: (!) 97.4 F (36.3 C)  TempSrc: Temporal  SpO2: 94%  Weight: 152 lb 9.6 oz (69.2 kg)  Height: 5\' 3"  (1.6 m)   Body mass index is 27.03 kg/m.  General:  WDWN in NAD; vital signs documented above Gait: Not observed HENT: WNL,  normocephalic Pulmonary: normal non-labored breathing , without Rales, rhonchi,  wheezing Cardiac: regular HR Abdomen: soft, NT, no masses Skin: without rashes Vascular Exam/Pulses:  Right Left  Radial 2+ (normal) 1+ (weak)   Extremities: Faintly palpable left radial pulse; palpable thrill throughout brachiobasilic fistula; most recent revision incision well-healed; area above this with hypopigmentation however skin is mobile; there is also a small superficial eschar in this area Musculoskeletal: no muscle wasting or atrophy  Neurologic: A&O X 3;  No focal weakness or paresthesias are detected Psychiatric:  The pt has Normal affect.    Medical Decision Making   ERRIC MACHNIK is a 69 y.o. male who presents  with ESRD requiring hemodialysis.    Patent left brachiocephalic fistula; incision is well-healed from recent revision at [facility]  There is an area of hypopigmentation with a small eschar adjacent to it however skin is mobile and eschar is small and superficial.  This area will likely heal in the next week or two if avoided  No indicated for further revision at this time  Patient will follow up as needed   Dagoberto Ligas PA-C [facility] Office: [phone]  Clinic MD: Early

## 2019-12-05 DIAGNOSIS — Z992 Dependence on renal dialysis: Secondary | ICD-10-CM | POA: Diagnosis not present

## 2019-12-05 DIAGNOSIS — Z79899 Other long term (current) drug therapy: Secondary | ICD-10-CM | POA: Diagnosis not present

## 2019-12-05 DIAGNOSIS — T86819 Unspecified complication of lung transplant: Secondary | ICD-10-CM | POA: Diagnosis not present

## 2019-12-05 DIAGNOSIS — Z5181 Encounter for therapeutic drug level monitoring: Secondary | ICD-10-CM | POA: Diagnosis not present

## 2019-12-05 DIAGNOSIS — Z942 Lung transplant status: Secondary | ICD-10-CM | POA: Diagnosis not present

## 2019-12-05 DIAGNOSIS — Z298 Encounter for other specified prophylactic measures: Secondary | ICD-10-CM | POA: Diagnosis not present

## 2019-12-05 DIAGNOSIS — N186 End stage renal disease: Secondary | ICD-10-CM | POA: Diagnosis not present

## 2019-12-06 DIAGNOSIS — Z992 Dependence on renal dialysis: Secondary | ICD-10-CM | POA: Diagnosis not present

## 2019-12-06 DIAGNOSIS — E876 Hypokalemia: Secondary | ICD-10-CM | POA: Diagnosis not present

## 2019-12-06 DIAGNOSIS — N2581 Secondary hyperparathyroidism of renal origin: Secondary | ICD-10-CM | POA: Diagnosis not present

## 2019-12-06 DIAGNOSIS — T8579XA Infection and inflammatory reaction due to other internal prosthetic devices, implants and grafts, initial encounter: Secondary | ICD-10-CM | POA: Diagnosis not present

## 2019-12-06 DIAGNOSIS — N186 End stage renal disease: Secondary | ICD-10-CM | POA: Diagnosis not present

## 2019-12-06 DIAGNOSIS — D509 Iron deficiency anemia, unspecified: Secondary | ICD-10-CM | POA: Diagnosis not present

## 2019-12-09 DIAGNOSIS — T8579XA Infection and inflammatory reaction due to other internal prosthetic devices, implants and grafts, initial encounter: Secondary | ICD-10-CM | POA: Diagnosis not present

## 2019-12-09 DIAGNOSIS — N2581 Secondary hyperparathyroidism of renal origin: Secondary | ICD-10-CM | POA: Diagnosis not present

## 2019-12-09 DIAGNOSIS — N186 End stage renal disease: Secondary | ICD-10-CM | POA: Diagnosis not present

## 2019-12-09 DIAGNOSIS — Z992 Dependence on renal dialysis: Secondary | ICD-10-CM | POA: Diagnosis not present

## 2019-12-09 DIAGNOSIS — E876 Hypokalemia: Secondary | ICD-10-CM | POA: Diagnosis not present

## 2019-12-09 DIAGNOSIS — D509 Iron deficiency anemia, unspecified: Secondary | ICD-10-CM | POA: Diagnosis not present

## 2019-12-11 DIAGNOSIS — Z992 Dependence on renal dialysis: Secondary | ICD-10-CM | POA: Diagnosis not present

## 2019-12-11 DIAGNOSIS — N186 End stage renal disease: Secondary | ICD-10-CM | POA: Diagnosis not present

## 2019-12-11 DIAGNOSIS — T8579XA Infection and inflammatory reaction due to other internal prosthetic devices, implants and grafts, initial encounter: Secondary | ICD-10-CM | POA: Diagnosis not present

## 2019-12-11 DIAGNOSIS — E876 Hypokalemia: Secondary | ICD-10-CM | POA: Diagnosis not present

## 2019-12-11 DIAGNOSIS — N2581 Secondary hyperparathyroidism of renal origin: Secondary | ICD-10-CM | POA: Diagnosis not present

## 2019-12-11 DIAGNOSIS — D509 Iron deficiency anemia, unspecified: Secondary | ICD-10-CM | POA: Diagnosis not present

## 2019-12-13 DIAGNOSIS — N186 End stage renal disease: Secondary | ICD-10-CM | POA: Diagnosis not present

## 2019-12-13 DIAGNOSIS — E876 Hypokalemia: Secondary | ICD-10-CM | POA: Diagnosis not present

## 2019-12-13 DIAGNOSIS — D509 Iron deficiency anemia, unspecified: Secondary | ICD-10-CM | POA: Diagnosis not present

## 2019-12-13 DIAGNOSIS — Z992 Dependence on renal dialysis: Secondary | ICD-10-CM | POA: Diagnosis not present

## 2019-12-13 DIAGNOSIS — T8579XA Infection and inflammatory reaction due to other internal prosthetic devices, implants and grafts, initial encounter: Secondary | ICD-10-CM | POA: Diagnosis not present

## 2019-12-13 DIAGNOSIS — N2581 Secondary hyperparathyroidism of renal origin: Secondary | ICD-10-CM | POA: Diagnosis not present

## 2019-12-16 DIAGNOSIS — D509 Iron deficiency anemia, unspecified: Secondary | ICD-10-CM | POA: Diagnosis not present

## 2019-12-16 DIAGNOSIS — N2581 Secondary hyperparathyroidism of renal origin: Secondary | ICD-10-CM | POA: Diagnosis not present

## 2019-12-16 DIAGNOSIS — N186 End stage renal disease: Secondary | ICD-10-CM | POA: Diagnosis not present

## 2019-12-16 DIAGNOSIS — Z992 Dependence on renal dialysis: Secondary | ICD-10-CM | POA: Diagnosis not present

## 2019-12-16 DIAGNOSIS — T8579XA Infection and inflammatory reaction due to other internal prosthetic devices, implants and grafts, initial encounter: Secondary | ICD-10-CM | POA: Diagnosis not present

## 2019-12-16 DIAGNOSIS — E876 Hypokalemia: Secondary | ICD-10-CM | POA: Diagnosis not present

## 2019-12-17 DIAGNOSIS — E1165 Type 2 diabetes mellitus with hyperglycemia: Secondary | ICD-10-CM | POA: Diagnosis not present

## 2019-12-17 DIAGNOSIS — E1122 Type 2 diabetes mellitus with diabetic chronic kidney disease: Secondary | ICD-10-CM | POA: Diagnosis not present

## 2019-12-17 DIAGNOSIS — Z794 Long term (current) use of insulin: Secondary | ICD-10-CM | POA: Diagnosis not present

## 2019-12-17 DIAGNOSIS — E11649 Type 2 diabetes mellitus with hypoglycemia without coma: Secondary | ICD-10-CM | POA: Diagnosis not present

## 2019-12-17 DIAGNOSIS — R4589 Other symptoms and signs involving emotional state: Secondary | ICD-10-CM | POA: Diagnosis not present

## 2019-12-17 DIAGNOSIS — Z992 Dependence on renal dialysis: Secondary | ICD-10-CM | POA: Diagnosis not present

## 2019-12-17 DIAGNOSIS — N186 End stage renal disease: Secondary | ICD-10-CM | POA: Diagnosis not present

## 2019-12-18 DIAGNOSIS — E1129 Type 2 diabetes mellitus with other diabetic kidney complication: Secondary | ICD-10-CM | POA: Diagnosis not present

## 2019-12-18 DIAGNOSIS — T8579XA Infection and inflammatory reaction due to other internal prosthetic devices, implants and grafts, initial encounter: Secondary | ICD-10-CM | POA: Diagnosis not present

## 2019-12-18 DIAGNOSIS — E876 Hypokalemia: Secondary | ICD-10-CM | POA: Diagnosis not present

## 2019-12-18 DIAGNOSIS — Z992 Dependence on renal dialysis: Secondary | ICD-10-CM | POA: Diagnosis not present

## 2019-12-18 DIAGNOSIS — D509 Iron deficiency anemia, unspecified: Secondary | ICD-10-CM | POA: Diagnosis not present

## 2019-12-18 DIAGNOSIS — N186 End stage renal disease: Secondary | ICD-10-CM | POA: Diagnosis not present

## 2019-12-18 DIAGNOSIS — N2581 Secondary hyperparathyroidism of renal origin: Secondary | ICD-10-CM | POA: Diagnosis not present

## 2019-12-19 DIAGNOSIS — G959 Disease of spinal cord, unspecified: Secondary | ICD-10-CM | POA: Diagnosis not present

## 2019-12-19 DIAGNOSIS — G63 Polyneuropathy in diseases classified elsewhere: Secondary | ICD-10-CM | POA: Diagnosis not present

## 2019-12-19 DIAGNOSIS — M542 Cervicalgia: Secondary | ICD-10-CM | POA: Diagnosis not present

## 2019-12-20 DIAGNOSIS — Z992 Dependence on renal dialysis: Secondary | ICD-10-CM | POA: Diagnosis not present

## 2019-12-20 DIAGNOSIS — D509 Iron deficiency anemia, unspecified: Secondary | ICD-10-CM | POA: Diagnosis not present

## 2019-12-20 DIAGNOSIS — T8579XA Infection and inflammatory reaction due to other internal prosthetic devices, implants and grafts, initial encounter: Secondary | ICD-10-CM | POA: Diagnosis not present

## 2019-12-20 DIAGNOSIS — N2581 Secondary hyperparathyroidism of renal origin: Secondary | ICD-10-CM | POA: Diagnosis not present

## 2019-12-20 DIAGNOSIS — N186 End stage renal disease: Secondary | ICD-10-CM | POA: Diagnosis not present

## 2019-12-20 DIAGNOSIS — E876 Hypokalemia: Secondary | ICD-10-CM | POA: Diagnosis not present

## 2019-12-23 DIAGNOSIS — N2581 Secondary hyperparathyroidism of renal origin: Secondary | ICD-10-CM | POA: Diagnosis not present

## 2019-12-23 DIAGNOSIS — D509 Iron deficiency anemia, unspecified: Secondary | ICD-10-CM | POA: Diagnosis not present

## 2019-12-23 DIAGNOSIS — Z992 Dependence on renal dialysis: Secondary | ICD-10-CM | POA: Diagnosis not present

## 2019-12-23 DIAGNOSIS — T8579XA Infection and inflammatory reaction due to other internal prosthetic devices, implants and grafts, initial encounter: Secondary | ICD-10-CM | POA: Diagnosis not present

## 2019-12-23 DIAGNOSIS — N186 End stage renal disease: Secondary | ICD-10-CM | POA: Diagnosis not present

## 2019-12-23 DIAGNOSIS — E876 Hypokalemia: Secondary | ICD-10-CM | POA: Diagnosis not present

## 2019-12-24 DIAGNOSIS — M6281 Muscle weakness (generalized): Secondary | ICD-10-CM | POA: Diagnosis not present

## 2019-12-24 DIAGNOSIS — M542 Cervicalgia: Secondary | ICD-10-CM | POA: Diagnosis not present

## 2019-12-24 DIAGNOSIS — M545 Low back pain: Secondary | ICD-10-CM | POA: Diagnosis not present

## 2019-12-24 DIAGNOSIS — R269 Unspecified abnormalities of gait and mobility: Secondary | ICD-10-CM | POA: Diagnosis not present

## 2019-12-25 DIAGNOSIS — E876 Hypokalemia: Secondary | ICD-10-CM | POA: Diagnosis not present

## 2019-12-25 DIAGNOSIS — Z992 Dependence on renal dialysis: Secondary | ICD-10-CM | POA: Diagnosis not present

## 2019-12-25 DIAGNOSIS — N2581 Secondary hyperparathyroidism of renal origin: Secondary | ICD-10-CM | POA: Diagnosis not present

## 2019-12-25 DIAGNOSIS — T8579XA Infection and inflammatory reaction due to other internal prosthetic devices, implants and grafts, initial encounter: Secondary | ICD-10-CM | POA: Diagnosis not present

## 2019-12-25 DIAGNOSIS — N186 End stage renal disease: Secondary | ICD-10-CM | POA: Diagnosis not present

## 2019-12-25 DIAGNOSIS — D509 Iron deficiency anemia, unspecified: Secondary | ICD-10-CM | POA: Diagnosis not present

## 2019-12-27 DIAGNOSIS — Z992 Dependence on renal dialysis: Secondary | ICD-10-CM | POA: Diagnosis not present

## 2019-12-27 DIAGNOSIS — N2581 Secondary hyperparathyroidism of renal origin: Secondary | ICD-10-CM | POA: Diagnosis not present

## 2019-12-27 DIAGNOSIS — E876 Hypokalemia: Secondary | ICD-10-CM | POA: Diagnosis not present

## 2019-12-27 DIAGNOSIS — D509 Iron deficiency anemia, unspecified: Secondary | ICD-10-CM | POA: Diagnosis not present

## 2019-12-27 DIAGNOSIS — T8579XA Infection and inflammatory reaction due to other internal prosthetic devices, implants and grafts, initial encounter: Secondary | ICD-10-CM | POA: Diagnosis not present

## 2019-12-27 DIAGNOSIS — N186 End stage renal disease: Secondary | ICD-10-CM | POA: Diagnosis not present

## 2019-12-30 DIAGNOSIS — Z992 Dependence on renal dialysis: Secondary | ICD-10-CM | POA: Diagnosis not present

## 2019-12-30 DIAGNOSIS — T8579XA Infection and inflammatory reaction due to other internal prosthetic devices, implants and grafts, initial encounter: Secondary | ICD-10-CM | POA: Diagnosis not present

## 2019-12-30 DIAGNOSIS — E876 Hypokalemia: Secondary | ICD-10-CM | POA: Diagnosis not present

## 2019-12-30 DIAGNOSIS — N186 End stage renal disease: Secondary | ICD-10-CM | POA: Diagnosis not present

## 2019-12-30 DIAGNOSIS — D509 Iron deficiency anemia, unspecified: Secondary | ICD-10-CM | POA: Diagnosis not present

## 2019-12-30 DIAGNOSIS — N2581 Secondary hyperparathyroidism of renal origin: Secondary | ICD-10-CM | POA: Diagnosis not present

## 2019-12-31 DIAGNOSIS — Z5181 Encounter for therapeutic drug level monitoring: Secondary | ICD-10-CM | POA: Diagnosis not present

## 2019-12-31 DIAGNOSIS — G629 Polyneuropathy, unspecified: Secondary | ICD-10-CM | POA: Diagnosis not present

## 2019-12-31 DIAGNOSIS — Z79899 Other long term (current) drug therapy: Secondary | ICD-10-CM | POA: Diagnosis not present

## 2019-12-31 DIAGNOSIS — Z7952 Long term (current) use of systemic steroids: Secondary | ICD-10-CM | POA: Diagnosis not present

## 2019-12-31 DIAGNOSIS — N186 End stage renal disease: Secondary | ICD-10-CM | POA: Diagnosis not present

## 2019-12-31 DIAGNOSIS — Z992 Dependence on renal dialysis: Secondary | ICD-10-CM | POA: Diagnosis not present

## 2019-12-31 DIAGNOSIS — D84821 Immunodeficiency due to drugs: Secondary | ICD-10-CM | POA: Diagnosis not present

## 2019-12-31 DIAGNOSIS — D849 Immunodeficiency, unspecified: Secondary | ICD-10-CM | POA: Diagnosis not present

## 2019-12-31 DIAGNOSIS — Z942 Lung transplant status: Secondary | ICD-10-CM | POA: Diagnosis not present

## 2019-12-31 DIAGNOSIS — T86818 Other complications of lung transplant: Secondary | ICD-10-CM | POA: Diagnosis not present

## 2019-12-31 DIAGNOSIS — Z8711 Personal history of peptic ulcer disease: Secondary | ICD-10-CM | POA: Diagnosis not present

## 2020-01-01 DIAGNOSIS — Z992 Dependence on renal dialysis: Secondary | ICD-10-CM | POA: Diagnosis not present

## 2020-01-01 DIAGNOSIS — N2581 Secondary hyperparathyroidism of renal origin: Secondary | ICD-10-CM | POA: Diagnosis not present

## 2020-01-01 DIAGNOSIS — D509 Iron deficiency anemia, unspecified: Secondary | ICD-10-CM | POA: Diagnosis not present

## 2020-01-01 DIAGNOSIS — E876 Hypokalemia: Secondary | ICD-10-CM | POA: Diagnosis not present

## 2020-01-01 DIAGNOSIS — T8579XA Infection and inflammatory reaction due to other internal prosthetic devices, implants and grafts, initial encounter: Secondary | ICD-10-CM | POA: Diagnosis not present

## 2020-01-01 DIAGNOSIS — N186 End stage renal disease: Secondary | ICD-10-CM | POA: Diagnosis not present

## 2020-01-02 ENCOUNTER — Ambulatory Visit
Admission: RE | Admit: 2020-01-02 | Discharge: 2020-01-02 | Disposition: A | Discharge status: Home / Self Care | Payer: Medicare Other | Source: Ambulatory Visit | Attending: Internal Medicine | Admitting: Internal Medicine

## 2020-01-02 ENCOUNTER — Other Ambulatory Visit: Payer: Self-pay | Admitting: Internal Medicine

## 2020-01-02 DIAGNOSIS — R269 Unspecified abnormalities of gait and mobility: Secondary | ICD-10-CM | POA: Diagnosis not present

## 2020-01-02 DIAGNOSIS — R17 Unspecified jaundice: Secondary | ICD-10-CM

## 2020-01-02 DIAGNOSIS — K7460 Unspecified cirrhosis of liver: Secondary | ICD-10-CM | POA: Diagnosis not present

## 2020-01-02 DIAGNOSIS — R748 Abnormal levels of other serum enzymes: Secondary | ICD-10-CM

## 2020-01-02 DIAGNOSIS — M545 Low back pain: Secondary | ICD-10-CM | POA: Diagnosis not present

## 2020-01-02 DIAGNOSIS — M542 Cervicalgia: Secondary | ICD-10-CM | POA: Diagnosis not present

## 2020-01-02 DIAGNOSIS — M6281 Muscle weakness (generalized): Secondary | ICD-10-CM | POA: Diagnosis not present

## 2020-01-03 DIAGNOSIS — F329 Major depressive disorder, single episode, unspecified: Secondary | ICD-10-CM | POA: Diagnosis not present

## 2020-01-03 DIAGNOSIS — E1122 Type 2 diabetes mellitus with diabetic chronic kidney disease: Secondary | ICD-10-CM | POA: Diagnosis not present

## 2020-01-03 DIAGNOSIS — H409 Unspecified glaucoma: Secondary | ICD-10-CM | POA: Diagnosis not present

## 2020-01-03 DIAGNOSIS — D509 Iron deficiency anemia, unspecified: Secondary | ICD-10-CM | POA: Diagnosis not present

## 2020-01-03 DIAGNOSIS — I10 Essential (primary) hypertension: Secondary | ICD-10-CM | POA: Diagnosis not present

## 2020-01-03 DIAGNOSIS — N186 End stage renal disease: Secondary | ICD-10-CM | POA: Diagnosis not present

## 2020-01-03 DIAGNOSIS — Z992 Dependence on renal dialysis: Secondary | ICD-10-CM | POA: Diagnosis not present

## 2020-01-03 DIAGNOSIS — I5032 Chronic diastolic (congestive) heart failure: Secondary | ICD-10-CM | POA: Diagnosis not present

## 2020-01-03 DIAGNOSIS — T8579XA Infection and inflammatory reaction due to other internal prosthetic devices, implants and grafts, initial encounter: Secondary | ICD-10-CM | POA: Diagnosis not present

## 2020-01-03 DIAGNOSIS — E782 Mixed hyperlipidemia: Secondary | ICD-10-CM | POA: Diagnosis not present

## 2020-01-03 DIAGNOSIS — I270 Primary pulmonary hypertension: Secondary | ICD-10-CM | POA: Diagnosis not present

## 2020-01-03 DIAGNOSIS — E876 Hypokalemia: Secondary | ICD-10-CM | POA: Diagnosis not present

## 2020-01-03 DIAGNOSIS — N2581 Secondary hyperparathyroidism of renal origin: Secondary | ICD-10-CM | POA: Diagnosis not present

## 2020-01-03 DIAGNOSIS — G4700 Insomnia, unspecified: Secondary | ICD-10-CM | POA: Diagnosis not present

## 2020-01-05 DIAGNOSIS — Z992 Dependence on renal dialysis: Secondary | ICD-10-CM | POA: Diagnosis not present

## 2020-01-05 DIAGNOSIS — T86819 Unspecified complication of lung transplant: Secondary | ICD-10-CM | POA: Diagnosis not present

## 2020-01-05 DIAGNOSIS — N186 End stage renal disease: Secondary | ICD-10-CM | POA: Diagnosis not present

## 2020-01-06 DIAGNOSIS — T8579XA Infection and inflammatory reaction due to other internal prosthetic devices, implants and grafts, initial encounter: Secondary | ICD-10-CM | POA: Diagnosis not present

## 2020-01-06 DIAGNOSIS — E876 Hypokalemia: Secondary | ICD-10-CM | POA: Diagnosis not present

## 2020-01-06 DIAGNOSIS — N186 End stage renal disease: Secondary | ICD-10-CM | POA: Diagnosis not present

## 2020-01-06 DIAGNOSIS — Z992 Dependence on renal dialysis: Secondary | ICD-10-CM | POA: Diagnosis not present

## 2020-01-06 DIAGNOSIS — N2581 Secondary hyperparathyroidism of renal origin: Secondary | ICD-10-CM | POA: Diagnosis not present

## 2020-01-07 ENCOUNTER — Inpatient Hospital Stay (HOSPITAL_COMMUNITY)
Admission: EM | Admit: 2020-01-07 | Discharge: 2020-01-14 | DRG: 871 | Disposition: A | Discharge status: Home / Self Care | Payer: Medicare Other | Attending: Internal Medicine | Admitting: Internal Medicine

## 2020-01-07 ENCOUNTER — Other Ambulatory Visit: Payer: Self-pay

## 2020-01-07 ENCOUNTER — Encounter (HOSPITAL_COMMUNITY): Payer: Self-pay | Admitting: Emergency Medicine

## 2020-01-07 ENCOUNTER — Emergency Department (HOSPITAL_COMMUNITY): Payer: Medicare Other

## 2020-01-07 DIAGNOSIS — N186 End stage renal disease: Secondary | ICD-10-CM | POA: Diagnosis not present

## 2020-01-07 DIAGNOSIS — A419 Sepsis, unspecified organism: Secondary | ICD-10-CM

## 2020-01-07 DIAGNOSIS — Z7952 Long term (current) use of systemic steroids: Secondary | ICD-10-CM

## 2020-01-07 DIAGNOSIS — Z8249 Family history of ischemic heart disease and other diseases of the circulatory system: Secondary | ICD-10-CM

## 2020-01-07 DIAGNOSIS — R6521 Severe sepsis with septic shock: Secondary | ICD-10-CM

## 2020-01-07 DIAGNOSIS — R197 Diarrhea, unspecified: Secondary | ICD-10-CM | POA: Diagnosis present

## 2020-01-07 DIAGNOSIS — T380X5A Adverse effect of glucocorticoids and synthetic analogues, initial encounter: Secondary | ICD-10-CM | POA: Diagnosis present

## 2020-01-07 DIAGNOSIS — R6520 Severe sepsis without septic shock: Secondary | ICD-10-CM | POA: Diagnosis present

## 2020-01-07 DIAGNOSIS — M7989 Other specified soft tissue disorders: Secondary | ICD-10-CM | POA: Diagnosis not present

## 2020-01-07 DIAGNOSIS — T80818A Extravasation of other vesicant agent, initial encounter: Secondary | ICD-10-CM | POA: Diagnosis not present

## 2020-01-07 DIAGNOSIS — K219 Gastro-esophageal reflux disease without esophagitis: Secondary | ICD-10-CM | POA: Diagnosis present

## 2020-01-07 DIAGNOSIS — Z794 Long term (current) use of insulin: Secondary | ICD-10-CM

## 2020-01-07 DIAGNOSIS — E1169 Type 2 diabetes mellitus with other specified complication: Secondary | ICD-10-CM | POA: Diagnosis present

## 2020-01-07 DIAGNOSIS — Z20822 Contact with and (suspected) exposure to covid-19: Secondary | ICD-10-CM | POA: Diagnosis present

## 2020-01-07 DIAGNOSIS — Z86010 Personal history of colonic polyps: Secondary | ICD-10-CM

## 2020-01-07 DIAGNOSIS — G9341 Metabolic encephalopathy: Secondary | ICD-10-CM | POA: Diagnosis present

## 2020-01-07 DIAGNOSIS — Z86718 Personal history of other venous thrombosis and embolism: Secondary | ICD-10-CM

## 2020-01-07 DIAGNOSIS — Y92238 Other place in hospital as the place of occurrence of the external cause: Secondary | ICD-10-CM | POA: Diagnosis not present

## 2020-01-07 DIAGNOSIS — Z881 Allergy status to other antibiotic agents status: Secondary | ICD-10-CM

## 2020-01-07 DIAGNOSIS — R509 Fever, unspecified: Secondary | ICD-10-CM | POA: Diagnosis not present

## 2020-01-07 DIAGNOSIS — J8410 Pulmonary fibrosis, unspecified: Secondary | ICD-10-CM | POA: Diagnosis not present

## 2020-01-07 DIAGNOSIS — J849 Interstitial pulmonary disease, unspecified: Secondary | ICD-10-CM | POA: Diagnosis present

## 2020-01-07 DIAGNOSIS — J9811 Atelectasis: Secondary | ICD-10-CM | POA: Diagnosis not present

## 2020-01-07 DIAGNOSIS — Z8711 Personal history of peptic ulcer disease: Secondary | ICD-10-CM

## 2020-01-07 DIAGNOSIS — A414 Sepsis due to anaerobes: Principal | ICD-10-CM | POA: Diagnosis present

## 2020-01-07 DIAGNOSIS — E1122 Type 2 diabetes mellitus with diabetic chronic kidney disease: Secondary | ICD-10-CM | POA: Diagnosis present

## 2020-01-07 DIAGNOSIS — K7460 Unspecified cirrhosis of liver: Secondary | ICD-10-CM | POA: Diagnosis present

## 2020-01-07 DIAGNOSIS — E872 Acidosis, unspecified: Secondary | ICD-10-CM | POA: Diagnosis present

## 2020-01-07 DIAGNOSIS — D631 Anemia in chronic kidney disease: Secondary | ICD-10-CM | POA: Diagnosis present

## 2020-01-07 DIAGNOSIS — T801XXA Vascular complications following infusion, transfusion and therapeutic injection, initial encounter: Secondary | ICD-10-CM | POA: Diagnosis present

## 2020-01-07 DIAGNOSIS — Z992 Dependence on renal dialysis: Secondary | ICD-10-CM

## 2020-01-07 DIAGNOSIS — I951 Orthostatic hypotension: Secondary | ICD-10-CM | POA: Diagnosis present

## 2020-01-07 DIAGNOSIS — J9601 Acute respiratory failure with hypoxia: Secondary | ICD-10-CM | POA: Diagnosis not present

## 2020-01-07 DIAGNOSIS — R918 Other nonspecific abnormal finding of lung field: Secondary | ICD-10-CM | POA: Diagnosis present

## 2020-01-07 DIAGNOSIS — A0472 Enterocolitis due to Clostridium difficile, not specified as recurrent: Secondary | ICD-10-CM | POA: Diagnosis present

## 2020-01-07 DIAGNOSIS — Z800 Family history of malignant neoplasm of digestive organs: Secondary | ICD-10-CM

## 2020-01-07 DIAGNOSIS — A09 Infectious gastroenteritis and colitis, unspecified: Secondary | ICD-10-CM

## 2020-01-07 DIAGNOSIS — G4733 Obstructive sleep apnea (adult) (pediatric): Secondary | ICD-10-CM | POA: Diagnosis present

## 2020-01-07 DIAGNOSIS — I10 Essential (primary) hypertension: Secondary | ICD-10-CM | POA: Diagnosis not present

## 2020-01-07 DIAGNOSIS — I120 Hypertensive chronic kidney disease with stage 5 chronic kidney disease or end stage renal disease: Secondary | ICD-10-CM | POA: Diagnosis present

## 2020-01-07 DIAGNOSIS — R251 Tremor, unspecified: Secondary | ICD-10-CM | POA: Diagnosis present

## 2020-01-07 DIAGNOSIS — Y848 Other medical procedures as the cause of abnormal reaction of the patient, or of later complication, without mention of misadventure at the time of the procedure: Secondary | ICD-10-CM | POA: Diagnosis not present

## 2020-01-07 DIAGNOSIS — Z8719 Personal history of other diseases of the digestive system: Secondary | ICD-10-CM

## 2020-01-07 DIAGNOSIS — Z7982 Long term (current) use of aspirin: Secondary | ICD-10-CM

## 2020-01-07 DIAGNOSIS — Z79899 Other long term (current) drug therapy: Secondary | ICD-10-CM

## 2020-01-07 DIAGNOSIS — E861 Hypovolemia: Secondary | ICD-10-CM | POA: Diagnosis present

## 2020-01-07 DIAGNOSIS — Z942 Lung transplant status: Secondary | ICD-10-CM

## 2020-01-07 DIAGNOSIS — D84821 Immunodeficiency due to drugs: Secondary | ICD-10-CM | POA: Diagnosis present

## 2020-01-07 DIAGNOSIS — E782 Mixed hyperlipidemia: Secondary | ICD-10-CM | POA: Diagnosis present

## 2020-01-07 DIAGNOSIS — E1165 Type 2 diabetes mellitus with hyperglycemia: Secondary | ICD-10-CM | POA: Diagnosis present

## 2020-01-07 DIAGNOSIS — Z886 Allergy status to analgesic agent status: Secondary | ICD-10-CM

## 2020-01-07 DIAGNOSIS — Z87891 Personal history of nicotine dependence: Secondary | ICD-10-CM

## 2020-01-07 DIAGNOSIS — N2581 Secondary hyperparathyroidism of renal origin: Secondary | ICD-10-CM | POA: Diagnosis present

## 2020-01-07 LAB — CBC WITH DIFFERENTIAL/PLATELET
Abs Immature Granulocytes: 0 10*3/uL (ref 0.00–0.07)
Basophils Absolute: 0 10*3/uL (ref 0.0–0.1)
Basophils Relative: 0 %
Eosinophils Absolute: 0 10*3/uL (ref 0.0–0.5)
Eosinophils Relative: 0 %
HCT: 31.5 % — ABNORMAL LOW (ref 39.0–52.0)
Hemoglobin: 10.1 g/dL — ABNORMAL LOW (ref 13.0–17.0)
Lymphocytes Relative: 0 %
Lymphs Abs: 0 10*3/uL — ABNORMAL LOW (ref 0.7–4.0)
MCH: 34.6 pg — ABNORMAL HIGH (ref 26.0–34.0)
MCHC: 32.1 g/dL (ref 30.0–36.0)
MCV: 107.9 fL — ABNORMAL HIGH (ref 80.0–100.0)
Monocytes Absolute: 0.5 10*3/uL (ref 0.1–1.0)
Monocytes Relative: 3 %
Neutro Abs: 14.7 10*3/uL — ABNORMAL HIGH (ref 1.7–7.7)
Neutrophils Relative %: 97 %
Platelets: 156 10*3/uL (ref 150–400)
RBC: 2.92 MIL/uL — ABNORMAL LOW (ref 4.22–5.81)
RDW: 14.8 % (ref 11.5–15.5)
WBC: 15.2 10*3/uL — ABNORMAL HIGH (ref 4.0–10.5)
nRBC: 0 % (ref 0.0–0.2)
nRBC: 0 /100 WBC

## 2020-01-07 LAB — COMPREHENSIVE METABOLIC PANEL
ALT: 25 U/L (ref 0–44)
AST: 47 U/L — ABNORMAL HIGH (ref 15–41)
Albumin: 2.7 g/dL — ABNORMAL LOW (ref 3.5–5.0)
Alkaline Phosphatase: 235 U/L — ABNORMAL HIGH (ref 38–126)
Anion gap: 16 — ABNORMAL HIGH (ref 5–15)
BUN: 44 mg/dL — ABNORMAL HIGH (ref 8–23)
CO2: 28 mmol/L (ref 22–32)
Calcium: 8.7 mg/dL — ABNORMAL LOW (ref 8.9–10.3)
Chloride: 93 mmol/L — ABNORMAL LOW (ref 98–111)
Creatinine, Ser: 8.78 mg/dL — ABNORMAL HIGH (ref 0.61–1.24)
GFR calc Af Amer: 6 mL/min — ABNORMAL LOW (ref >60)
GFR calc non Af Amer: 5 mL/min — ABNORMAL LOW (ref >60)
Glucose, Bld: 197 mg/dL — ABNORMAL HIGH (ref 70–99)
Potassium: 3.7 mmol/L (ref 3.5–5.1)
Sodium: 137 mmol/L (ref 135–145)
Total Bilirubin: 3.5 mg/dL — ABNORMAL HIGH (ref 0.3–1.2)
Total Protein: 6 g/dL — ABNORMAL LOW (ref 6.5–8.1)

## 2020-01-07 LAB — PROTIME-INR
INR: 1.1 (ref 0.8–1.2)
Prothrombin Time: 14.1 seconds (ref 11.4–15.2)

## 2020-01-07 LAB — SARS CORONAVIRUS 2 BY RT PCR (HOSPITAL ORDER, PERFORMED IN ~~LOC~~ HOSPITAL LAB): SARS Coronavirus 2: NEGATIVE

## 2020-01-07 LAB — LACTIC ACID, PLASMA: Lactic Acid, Venous: 2.2 mmol/L (ref 0.5–1.9)

## 2020-01-07 MED ORDER — ACETAMINOPHEN 325 MG PO TABS
650.0000 mg | ORAL_TABLET | Freq: Once | ORAL | Status: AC | PRN
  Administered 2020-01-07: 650 mg via ORAL
  Filled 2020-01-07: qty 2

## 2020-01-07 NOTE — ED Triage Notes (Signed)
Patient reports fever/chills, body aches, cough, SOB X1 day. Sats mid 80's RA in triage, febrile. Vaccinated against [place].

## 2020-01-08 ENCOUNTER — Encounter (HOSPITAL_COMMUNITY): Payer: Self-pay | Admitting: Internal Medicine

## 2020-01-08 ENCOUNTER — Inpatient Hospital Stay (HOSPITAL_COMMUNITY): Payer: Medicare Other

## 2020-01-08 ENCOUNTER — Emergency Department (HOSPITAL_COMMUNITY): Payer: Medicare Other

## 2020-01-08 DIAGNOSIS — K7460 Unspecified cirrhosis of liver: Secondary | ICD-10-CM

## 2020-01-08 DIAGNOSIS — Z942 Lung transplant status: Secondary | ICD-10-CM | POA: Diagnosis not present

## 2020-01-08 DIAGNOSIS — K219 Gastro-esophageal reflux disease without esophagitis: Secondary | ICD-10-CM

## 2020-01-08 DIAGNOSIS — R197 Diarrhea, unspecified: Secondary | ICD-10-CM

## 2020-01-08 DIAGNOSIS — E1122 Type 2 diabetes mellitus with diabetic chronic kidney disease: Secondary | ICD-10-CM | POA: Diagnosis present

## 2020-01-08 DIAGNOSIS — E1165 Type 2 diabetes mellitus with hyperglycemia: Secondary | ICD-10-CM

## 2020-01-08 DIAGNOSIS — N200 Calculus of kidney: Secondary | ICD-10-CM | POA: Diagnosis not present

## 2020-01-08 DIAGNOSIS — J8410 Pulmonary fibrosis, unspecified: Secondary | ICD-10-CM | POA: Diagnosis present

## 2020-01-08 DIAGNOSIS — K8020 Calculus of gallbladder without cholecystitis without obstruction: Secondary | ICD-10-CM | POA: Diagnosis not present

## 2020-01-08 DIAGNOSIS — J849 Interstitial pulmonary disease, unspecified: Secondary | ICD-10-CM

## 2020-01-08 DIAGNOSIS — Z20822 Contact with and (suspected) exposure to covid-19: Secondary | ICD-10-CM | POA: Diagnosis present

## 2020-01-08 DIAGNOSIS — D631 Anemia in chronic kidney disease: Secondary | ICD-10-CM | POA: Diagnosis present

## 2020-01-08 DIAGNOSIS — Y92238 Other place in hospital as the place of occurrence of the external cause: Secondary | ICD-10-CM | POA: Diagnosis not present

## 2020-01-08 DIAGNOSIS — E1169 Type 2 diabetes mellitus with other specified complication: Secondary | ICD-10-CM

## 2020-01-08 DIAGNOSIS — I959 Hypotension, unspecified: Secondary | ICD-10-CM | POA: Diagnosis not present

## 2020-01-08 DIAGNOSIS — E861 Hypovolemia: Secondary | ICD-10-CM | POA: Diagnosis present

## 2020-01-08 DIAGNOSIS — E782 Mixed hyperlipidemia: Secondary | ICD-10-CM | POA: Diagnosis present

## 2020-01-08 DIAGNOSIS — R531 Weakness: Secondary | ICD-10-CM | POA: Diagnosis not present

## 2020-01-08 DIAGNOSIS — I10 Essential (primary) hypertension: Secondary | ICD-10-CM | POA: Diagnosis not present

## 2020-01-08 DIAGNOSIS — Z992 Dependence on renal dialysis: Secondary | ICD-10-CM

## 2020-01-08 DIAGNOSIS — G9341 Metabolic encephalopathy: Secondary | ICD-10-CM

## 2020-01-08 DIAGNOSIS — N186 End stage renal disease: Secondary | ICD-10-CM | POA: Diagnosis present

## 2020-01-08 DIAGNOSIS — D84821 Immunodeficiency due to drugs: Secondary | ICD-10-CM | POA: Diagnosis present

## 2020-01-08 DIAGNOSIS — E872 Acidosis, unspecified: Secondary | ICD-10-CM | POA: Diagnosis present

## 2020-01-08 DIAGNOSIS — Y848 Other medical procedures as the cause of abnormal reaction of the patient, or of later complication, without mention of misadventure at the time of the procedure: Secondary | ICD-10-CM | POA: Diagnosis not present

## 2020-01-08 DIAGNOSIS — A419 Sepsis, unspecified organism: Secondary | ICD-10-CM

## 2020-01-08 DIAGNOSIS — R0602 Shortness of breath: Secondary | ICD-10-CM | POA: Diagnosis not present

## 2020-01-08 DIAGNOSIS — A0472 Enterocolitis due to Clostridium difficile, not specified as recurrent: Secondary | ICD-10-CM | POA: Diagnosis present

## 2020-01-08 DIAGNOSIS — N2581 Secondary hyperparathyroidism of renal origin: Secondary | ICD-10-CM | POA: Diagnosis present

## 2020-01-08 DIAGNOSIS — K5730 Diverticulosis of large intestine without perforation or abscess without bleeding: Secondary | ICD-10-CM | POA: Diagnosis not present

## 2020-01-08 DIAGNOSIS — A414 Sepsis due to anaerobes: Secondary | ICD-10-CM | POA: Diagnosis present

## 2020-01-08 DIAGNOSIS — T801XXA Vascular complications following infusion, transfusion and therapeutic injection, initial encounter: Secondary | ICD-10-CM

## 2020-01-08 DIAGNOSIS — Z794 Long term (current) use of insulin: Secondary | ICD-10-CM

## 2020-01-08 DIAGNOSIS — R6520 Severe sepsis without septic shock: Secondary | ICD-10-CM

## 2020-01-08 DIAGNOSIS — I951 Orthostatic hypotension: Secondary | ICD-10-CM | POA: Diagnosis present

## 2020-01-08 DIAGNOSIS — I120 Hypertensive chronic kidney disease with stage 5 chronic kidney disease or end stage renal disease: Secondary | ICD-10-CM | POA: Diagnosis present

## 2020-01-08 DIAGNOSIS — J9601 Acute respiratory failure with hypoxia: Secondary | ICD-10-CM | POA: Diagnosis present

## 2020-01-08 DIAGNOSIS — T80818A Extravasation of other vesicant agent, initial encounter: Secondary | ICD-10-CM | POA: Diagnosis not present

## 2020-01-08 DIAGNOSIS — R6521 Severe sepsis with septic shock: Secondary | ICD-10-CM

## 2020-01-08 DIAGNOSIS — E1129 Type 2 diabetes mellitus with other diabetic kidney complication: Secondary | ICD-10-CM | POA: Diagnosis not present

## 2020-01-08 LAB — GASTROINTESTINAL PANEL BY PCR, STOOL (REPLACES STOOL CULTURE)

## 2020-01-08 LAB — CBC WITH DIFFERENTIAL/PLATELET
Abs Immature Granulocytes: 0.37 10*3/uL — ABNORMAL HIGH (ref 0.00–0.07)
Basophils Absolute: 0 10*3/uL (ref 0.0–0.1)
Basophils Relative: 0 %
Eosinophils Absolute: 0.1 10*3/uL (ref 0.0–0.5)
Eosinophils Relative: 1 %
HCT: 30.4 % — ABNORMAL LOW (ref 39.0–52.0)
Hemoglobin: 9.5 g/dL — ABNORMAL LOW (ref 13.0–17.0)
Immature Granulocytes: 3 %
Lymphocytes Relative: 3 %
Lymphs Abs: 0.3 10*3/uL — ABNORMAL LOW (ref 0.7–4.0)
MCH: 33.8 pg (ref 26.0–34.0)
MCHC: 31.3 g/dL (ref 30.0–36.0)
MCV: 108.2 fL — ABNORMAL HIGH (ref 80.0–100.0)
Monocytes Absolute: 0.8 10*3/uL (ref 0.1–1.0)
Monocytes Relative: 7 %
Neutro Abs: 10.2 10*3/uL — ABNORMAL HIGH (ref 1.7–7.7)
Neutrophils Relative %: 86 %
Platelets: 125 10*3/uL — ABNORMAL LOW (ref 150–400)
RBC: 2.81 MIL/uL — ABNORMAL LOW (ref 4.22–5.81)
RDW: 14.7 % (ref 11.5–15.5)
WBC: 11.8 10*3/uL — ABNORMAL HIGH (ref 4.0–10.5)
nRBC: 0 % (ref 0.0–0.2)

## 2020-01-08 LAB — MAGNESIUM: Magnesium: 2.1 mg/dL (ref 1.7–2.4)

## 2020-01-08 LAB — COMPREHENSIVE METABOLIC PANEL
ALT: 22 U/L (ref 0–44)
AST: 34 U/L (ref 15–41)
Albumin: 2.4 g/dL — ABNORMAL LOW (ref 3.5–5.0)
Alkaline Phosphatase: 212 U/L — ABNORMAL HIGH (ref 38–126)
Anion gap: 13 (ref 5–15)
BUN: 52 mg/dL — ABNORMAL HIGH (ref 8–23)
CO2: 27 mmol/L (ref 22–32)
Calcium: 8.1 mg/dL — ABNORMAL LOW (ref 8.9–10.3)
Chloride: 94 mmol/L — ABNORMAL LOW (ref 98–111)
Creatinine, Ser: 9.5 mg/dL — ABNORMAL HIGH (ref 0.61–1.24)
GFR calc Af Amer: 6 mL/min — ABNORMAL LOW (ref >60)
GFR calc non Af Amer: 5 mL/min — ABNORMAL LOW (ref >60)
Glucose, Bld: 103 mg/dL — ABNORMAL HIGH (ref 70–99)
Potassium: 3.7 mmol/L (ref 3.5–5.1)
Sodium: 134 mmol/L — ABNORMAL LOW (ref 135–145)
Total Bilirubin: 2.7 mg/dL — ABNORMAL HIGH (ref 0.3–1.2)
Total Protein: 5.5 g/dL — ABNORMAL LOW (ref 6.5–8.1)

## 2020-01-08 LAB — LACTIC ACID, PLASMA
Lactic Acid, Venous: 3.6 mmol/L (ref 0.5–1.9)
Lactic Acid, Venous: 1.4 mmol/L (ref 0.5–1.9)

## 2020-01-08 LAB — APTT: aPTT: 41 seconds — ABNORMAL HIGH (ref 24–36)

## 2020-01-08 LAB — CBG MONITORING, ED
Glucose-Capillary: 92 mg/dL (ref 70–99)
Glucose-Capillary: 98 mg/dL (ref 70–99)

## 2020-01-08 LAB — RESP PANEL BY RT PCR (RSV, FLU A&B, COVID)
Influenza A by PCR: NEGATIVE
Influenza B by PCR: NEGATIVE
Respiratory Syncytial Virus by PCR: NEGATIVE
SARS Coronavirus 2 by RT PCR: NEGATIVE

## 2020-01-08 MED ORDER — CHLORHEXIDINE GLUCONATE CLOTH 2 % EX PADS
6.0000 | MEDICATED_PAD | Freq: Every day | CUTANEOUS | Status: DC
  Administered 2020-01-09 – 2020-01-14 (×6): 6 via TOPICAL

## 2020-01-08 MED ORDER — INSULIN ASPART 100 UNIT/ML ~~LOC~~ SOLN
0.0000 [IU] | Freq: Three times a day (TID) | SUBCUTANEOUS | Status: DC
  Administered 2020-01-09: 3 [IU] via SUBCUTANEOUS
  Administered 2020-01-09 – 2020-01-12 (×6): 1 [IU] via SUBCUTANEOUS
  Administered 2020-01-12 (×2): 2 [IU] via SUBCUTANEOUS
  Administered 2020-01-12: 1 [IU] via SUBCUTANEOUS
  Administered 2020-01-13: 3 [IU] via SUBCUTANEOUS
  Administered 2020-01-13 – 2020-01-14 (×2): 1 [IU] via SUBCUTANEOUS

## 2020-01-08 MED ORDER — METRONIDAZOLE IN NACL 5-0.79 MG/ML-% IV SOLN
500.0000 mg | Freq: Once | INTRAVENOUS | Status: AC
  Administered 2020-01-08: 500 mg via INTRAVENOUS
  Filled 2020-01-08: qty 100

## 2020-01-08 MED ORDER — FLUTICASONE PROPIONATE 50 MCG/ACT NA SUSP
2.0000 | Freq: Every day | NASAL | Status: DC
  Administered 2020-01-09 – 2020-01-14 (×5): 2 via NASAL
  Filled 2020-01-08: qty 16

## 2020-01-08 MED ORDER — LACTATED RINGERS IV BOLUS
500.0000 mL | Freq: Once | INTRAVENOUS | Status: AC
  Administered 2020-01-08: 500 mL via INTRAVENOUS

## 2020-01-08 MED ORDER — SODIUM CHLORIDE 0.9 % IV SOLN: 2.0000 g | INTRAVENOUS | Status: DC

## 2020-01-08 MED ORDER — VANCOMYCIN HCL IN DEXTROSE 750-5 MG/150ML-% IV SOLN
INTRAVENOUS | Status: AC
  Administered 2020-01-08: 750 mg via INTRAVENOUS
  Filled 2020-01-08: qty 150

## 2020-01-08 MED ORDER — VITAMIN B-6 100 MG PO TABS
100.0000 mg | ORAL_TABLET | Freq: Every day | ORAL | Status: DC
  Administered 2020-01-08 – 2020-01-14 (×6): 100 mg via ORAL
  Filled 2020-01-08 (×7): qty 1

## 2020-01-08 MED ORDER — POLYVINYL ALCOHOL 1.4 % OP SOLN
1.0000 [drp] | Freq: Every day | OPHTHALMIC | Status: DC | PRN
  Filled 2020-01-08: qty 15

## 2020-01-08 MED ORDER — ASPIRIN 81 MG PO CHEW
81.0000 mg | CHEWABLE_TABLET | Freq: Every day | ORAL | Status: DC
  Administered 2020-01-08 – 2020-01-14 (×7): 81 mg via ORAL
  Filled 2020-01-08 (×7): qty 1

## 2020-01-08 MED ORDER — VANCOMYCIN HCL IN DEXTROSE 750-5 MG/150ML-% IV SOLN
750.0000 mg | INTRAVENOUS | Status: DC
  Filled 2020-01-08 (×2): qty 150

## 2020-01-08 MED ORDER — SODIUM CHLORIDE 0.9 % IV BOLUS (SEPSIS): 250.0000 mL | Freq: Once | INTRAVENOUS | Status: DC

## 2020-01-08 MED ORDER — MIDODRINE HCL 5 MG PO TABS
10.0000 mg | ORAL_TABLET | Freq: Every day | ORAL | Status: DC
  Administered 2020-01-08: 10 mg via ORAL
  Filled 2020-01-08 (×2): qty 2

## 2020-01-08 MED ORDER — ACETAMINOPHEN 325 MG PO TABS
ORAL_TABLET | ORAL | Status: AC
  Administered 2020-01-08: 650 mg via ORAL
  Filled 2020-01-08: qty 2

## 2020-01-08 MED ORDER — VALGANCICLOVIR HCL 450 MG PO TABS
450.0000 mg | ORAL_TABLET | ORAL | Status: DC
  Administered 2020-01-10 – 2020-01-13 (×2): 450 mg via ORAL
  Filled 2020-01-08 (×2): qty 1

## 2020-01-08 MED ORDER — METRONIDAZOLE IN NACL 5-0.79 MG/ML-% IV SOLN
500.0000 mg | Freq: Three times a day (TID) | INTRAVENOUS | Status: DC
  Administered 2020-01-08 – 2020-01-10 (×6): 500 mg via INTRAVENOUS
  Filled 2020-01-08 (×6): qty 100

## 2020-01-08 MED ORDER — HEPARIN SODIUM (PORCINE) 5000 UNIT/ML IJ SOLN
5000.0000 [IU] | Freq: Three times a day (TID) | INTRAMUSCULAR | Status: DC
  Administered 2020-01-08 – 2020-01-14 (×15): 5000 [IU] via SUBCUTANEOUS
  Filled 2020-01-08 (×16): qty 1

## 2020-01-08 MED ORDER — INSULIN ASPART 100 UNIT/ML ~~LOC~~ SOLN
6.0000 [IU] | Freq: Every day | SUBCUTANEOUS | Status: DC
  Administered 2020-01-08: 6 [IU] via SUBCUTANEOUS

## 2020-01-08 MED ORDER — DORZOLAMIDE HCL-TIMOLOL MAL PF 22.3-6.8 MG/ML OP SOLN: 1.0000 [drp] | Freq: Two times a day (BID) | OPHTHALMIC | Status: DC

## 2020-01-08 MED ORDER — VANCOMYCIN HCL IN DEXTROSE 1-5 GM/200ML-% IV SOLN: 1000.0000 mg | Freq: Once | INTRAVENOUS | Status: DC

## 2020-01-08 MED ORDER — PANTOPRAZOLE SODIUM 40 MG PO TBEC
40.0000 mg | DELAYED_RELEASE_TABLET | Freq: Two times a day (BID) | ORAL | Status: DC
  Administered 2020-01-08 – 2020-01-14 (×12): 40 mg via ORAL
  Filled 2020-01-08 (×12): qty 1

## 2020-01-08 MED ORDER — ACETAMINOPHEN 650 MG RE SUPP: 650.0000 mg | Freq: Four times a day (QID) | RECTAL | Status: DC | PRN

## 2020-01-08 MED ORDER — SODIUM CHLORIDE 0.9 % IV SOLN
2.0000 g | Freq: Once | INTRAVENOUS | Status: AC
  Administered 2020-01-08: 2 g via INTRAVENOUS
  Filled 2020-01-08: qty 2

## 2020-01-08 MED ORDER — INSULIN ASPART 100 UNIT/ML ~~LOC~~ SOLN
5.0000 [IU] | Freq: Every day | SUBCUTANEOUS | Status: DC
  Administered 2020-01-14: 5 [IU] via SUBCUTANEOUS

## 2020-01-08 MED ORDER — TRAMADOL HCL 50 MG PO TABS
25.0000 mg | ORAL_TABLET | Freq: Four times a day (QID) | ORAL | Status: DC | PRN
  Administered 2020-01-10: 25 mg via ORAL
  Filled 2020-01-08 (×2): qty 1

## 2020-01-08 MED ORDER — ALPHA LIPOIC ACID 200 MG PO CAPS: 200.0000 mg | ORAL_CAPSULE | Freq: Two times a day (BID) | ORAL | Status: DC

## 2020-01-08 MED ORDER — PREDNISONE 5 MG PO TABS
5.0000 mg | ORAL_TABLET | Freq: Every day | ORAL | Status: DC
  Administered 2020-01-08 – 2020-01-14 (×7): 5 mg via ORAL
  Filled 2020-01-08 (×8): qty 1

## 2020-01-08 MED ORDER — ONDANSETRON HCL 4 MG/2ML IJ SOLN: 4.0000 mg | Freq: Four times a day (QID) | INTRAMUSCULAR | Status: DC | PRN

## 2020-01-08 MED ORDER — SODIUM CHLORIDE 0.9 % IV SOLN: INTRAVENOUS | Status: DC

## 2020-01-08 MED ORDER — MULTIVITAMINS PO CAPS: 1.0000 | ORAL_CAPSULE | ORAL | Status: DC

## 2020-01-08 MED ORDER — ACETAMINOPHEN 325 MG PO TABS
650.0000 mg | ORAL_TABLET | Freq: Four times a day (QID) | ORAL | Status: DC | PRN
  Administered 2020-01-09: 650 mg via ORAL
  Filled 2020-01-08: qty 2

## 2020-01-08 MED ORDER — SULFAMETHOXAZOLE-TRIMETHOPRIM 400-80 MG PO TABS
1.0000 | ORAL_TABLET | ORAL | Status: DC
  Administered 2020-01-08 – 2020-01-13 (×3): 1 via ORAL
  Filled 2020-01-08 (×3): qty 1

## 2020-01-08 MED ORDER — POLYETHYLENE GLYCOL 3350 17 G PO PACK: 17.0000 g | PACK | Freq: Every day | ORAL | Status: DC | PRN

## 2020-01-08 MED ORDER — ONDANSETRON HCL 4 MG PO TABS: 4.0000 mg | ORAL_TABLET | Freq: Four times a day (QID) | ORAL | Status: DC | PRN

## 2020-01-08 MED ORDER — ADULT MULTIVITAMIN W/MINERALS CH
1.0000 | ORAL_TABLET | ORAL | Status: DC
  Administered 2020-01-09 – 2020-01-14 (×4): 1 via ORAL
  Filled 2020-01-08 (×5): qty 1

## 2020-01-08 MED ORDER — DORZOLAMIDE HCL-TIMOLOL MAL 2-0.5 % OP SOLN
1.0000 [drp] | Freq: Two times a day (BID) | OPHTHALMIC | Status: DC
  Administered 2020-01-08 – 2020-01-14 (×11): 1 [drp] via OPHTHALMIC
  Filled 2020-01-08: qty 10

## 2020-01-08 MED ORDER — IOHEXOL 350 MG/ML SOLN: 100.0000 mL | Freq: Once | INTRAVENOUS | Status: DC | PRN

## 2020-01-08 MED ORDER — VANCOMYCIN HCL 1500 MG/300ML IV SOLN
1500.0000 mg | Freq: Once | INTRAVENOUS | Status: AC
  Administered 2020-01-08: 1500 mg via INTRAVENOUS
  Filled 2020-01-08: qty 300

## 2020-01-08 MED ORDER — INSULIN ASPART 100 UNIT/ML ~~LOC~~ SOLN
6.0000 [IU] | Freq: Every day | SUBCUTANEOUS | Status: DC
  Administered 2020-01-13: 6 [IU] via SUBCUTANEOUS

## 2020-01-08 MED ORDER — SODIUM CHLORIDE 0.9 % IV SOLN
1.0000 g | INTRAVENOUS | Status: DC
  Administered 2020-01-09 (×2): 1 g via INTRAVENOUS
  Filled 2020-01-08 (×3): qty 1

## 2020-01-08 MED ORDER — CALCIUM ACETATE (PHOS BINDER) 667 MG PO CAPS
1334.0000 mg | ORAL_CAPSULE | Freq: Three times a day (TID) | ORAL | Status: DC
  Administered 2020-01-08 – 2020-01-11 (×8): 1334 mg via ORAL
  Filled 2020-01-08 (×8): qty 2

## 2020-01-08 MED ORDER — PREDNISOLONE ACETATE 1 % OP SUSP
1.0000 [drp] | Freq: Two times a day (BID) | OPHTHALMIC | Status: DC
  Administered 2020-01-08 – 2020-01-14 (×11): 1 [drp] via OPHTHALMIC
  Filled 2020-01-08: qty 5

## 2020-01-08 MED ORDER — SODIUM CHLORIDE 0.9 % IV SOLN: 2.0000 g | Freq: Once | INTRAVENOUS | Status: DC

## 2020-01-08 MED ORDER — IOHEXOL 350 MG/ML SOLN
100.0000 mL | Freq: Once | INTRAVENOUS | Status: AC | PRN
  Administered 2020-01-08: 100 mL via INTRAVENOUS

## 2020-01-08 NOTE — Progress Notes (Signed)
Evaluation after Contrast Extravasation  Patient seen and examined after contrast extravasation while in ED (occurred last evening before shift change)- per notes approximately 100 cc contrast into RUE AC fossa.  Exam: There is mild swelling at the RUE AC fossa.  There is no erythema. There is no discoloration. There are no blisters. There are no signs of decreased perfusion of the skin.  It is warm to touch.  The patient has normal ROM in fingers.  Radial pulses 2+ bilaterally.  Per contrast extravasation protocol, I have instructed the patient to keep an ice pack on the area for 20-60 minutes at a time for about 48 hours.   Keep arm elevated as much as possible.   The patient understands to call the radiology department if there is: - increase in pain or swelling - changed or altered sensation - ulceration or blistering - increasing redness - warmth or increasing firmness - decreased tissue perfusion as noted by decreased capillary refill or discoloration of skin - decreased pulses peripheral to site  Floor staff to follow- please call IR for worsening swelling/pain/change in neurovascular status of affected extremity.   Dodger Sinning PA-C 01/08/2020 1:28 PM

## 2020-01-08 NOTE — Progress Notes (Signed)
100 mls Omni 350 infiltrated during injection for CTA Chest PE/Abdomen Pelvis with contrast. Dr Gerilyn Nestle, radiologist, assessed IV site following removal of IV. Ice pack was placed on site. Ordering MD notified, and Dr Gerilyn Nestle to place extravasation orders.

## 2020-01-08 NOTE — ED Provider Notes (Signed)
[facility] EMERGENCY DEPARTMENT Provider Note   CSN: [id number] Arrival date & time: 01/07/20  1839     History Chief Complaint  Patient presents with  . Fever    [name] is a 69 y.o. male.  Patient with history of ESRD on dialysis Monday, Wednesday and Friday.  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  Diabetes, hypertension, sleep apnea presenting with 36-hour history of body aches, chills, cough, shortness of breath.  States symptoms started after dialysis on August second.  He describes chills and aches all over as well as some back pain which is since resolved.  Not know he had a fever until he arrived to the ED was 1-1.7.  He was found to be hypoxic on room air in the 80s and does not wear oxygen at home.  States he is not coughing up anything.  He does feel sore and achy all over.  Denies any runny nose or sore throat.  Denies any chest pain.  Denies any abdominal pain but has had several episodes of diarrhea since yesterday.  Does not make any urine.  No sick contacts.  Did receive his Covid vaccines. Complains of some achiness in his legs at the moment.  Denies any falls or trauma.  The history is provided by the patient. The history is limited by the condition of the patient.  Fever Associated symptoms: cough, diarrhea and myalgias   Associated symptoms: no chest pain, no congestion, no dysuria, no headaches, no nausea, no rhinorrhea and no vomiting        Past Medical History:  Diagnosis Date  . Aortic valve disorders   . Benign neoplasm of colon   . Degeneration of intervertebral disc, site unspecified   . Diabetes mellitus without complication ([place])   . 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 Esophageal reflux   . ESRD (end stage renal disease) on dialysis ([place])   . Essential hypertension   . Obstructive sleep apnea (adult) (pediatric)   . Osteoarthrosis, unspecified whether  generalized or localized, unspecified site   . Other and unspecified hyperlipidemia   . Pneumonia    interstitial pneumonia  . Pulmonary fibrosis ([place])   . Renal disorder   . Respiratory failure with hypoxia ([place]) 12/2015  . Shortness of breath dyspnea   . Transplanted, lung ([place])   . Unspecified essential hypertension     Patient Active Problem List   Diagnosis Date Noted  . 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 AV fistula infection, initial encounter ([place]) 01/14/2019  . Immunocompromised state ([place]) 01/14/2019  . Wound infection 01/13/2019  . Sepsis ([place])   . CKD (chronic kidney disease)   . Generalized weakness 12/30/2015  . ESRD (end stage renal disease) on dialysis ([place])   . 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[place])   . Acute respiratory failure with hypoxemia ([place]) 04/18/2015  . Septic shock ([place]) 04/18/2015  . Acute encephalopathy 04/18/2015  . Acute respiratory failure ([place]) 04/18/2015  . Cardiac arrest ([place])   . HCAP (healthcare-associated pneumonia)   . Elevated rheumatoid factor 05/09/2014  . Essential hypertension 05/09/2014  . ILD (interstitial lung disease) ([place]) 05/09/2014  . Obstructive apnea 05/09/2014  . Lung nodule, solitary 04/18/2014  . Awaiting organ transplant 04/18/2014  . Acute on chronic respiratory failure with hypoxia ([place]) 08/15/2013  . Edema 07/05/2013  . Chronic respiratory failure ([place]) 03/03/2013  . Diabetes mellitus with complication ([place]) 73/41/9379  . Acute sinusitis 05/04/2011  .  Cough 11/10/2010  . 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 Obstructive sleep apnea 10/09/2008  . ACID REFLUX DISEASE 10/09/2008  . HIATAL HERNIA 10/09/2008  . OSTEOARTHRITIS 10/09/2008    Past Surgical History:  Procedure Laterality Date  . COLONOSCOPY WITH PROPOFOL N/A 11/07/2017   Procedure: COLONOSCOPY WITH PROPOFOL;  Surgeon: Ronnette Juniper, MD;  Location: [facility];  Service: Gastroenterology;  Laterality: N/A;  . LIGATION OF  ARTERIOVENOUS  FISTULA Left 01/14/2019   Procedure: LIGATION OF ARTERIOVENOUS  FISTULA;  Surgeon: Marty Heck, MD;  Location: [facility];  Service: Vascular;  Laterality: Left;  . LUNG BIOPSY  2010  . LUNG TRANSPLANT, SINGLE Right   . POLYPECTOMY  11/07/2017   Procedure: POLYPECTOMY;  Surgeon: Ronnette Juniper, MD;  Location: [facility];  Service: Gastroenterology;;       Family History  Problem Relation Age of Onset  . Pancreatic cancer Brother   . Heart disease Father     Social History   Tobacco Use  . Smoking status: Former Smoker    Packs/day: 0.30    Years: 20.00    Pack years: 6.00    Types: Cigarettes    Quit date: 06/06/2002    Years since quitting: 17.6  . Smokeless tobacco: Never Used  Vaping Use  . Vaping Use: Never used  Substance Use Topics  . Alcohol use: No    Alcohol/week: 0.0 standard drinks  . Drug use: No    Home Medications Prior to Admission medications   Medication Sig Start Date End Date Taking? Authorizing Provider  ACCU-CHEK FASTCLIX LANCETS MISC As directed up to 4 times daily 12/16/15   [provider]  acetaminophen (TYLENOL) 500 MG tablet Take 500-1,000 mg by mouth every 6 (six) hours as needed for moderate pain or headache.     [provider]  Alpha Lipoic Acid 200 MG CAPS Take 1 capsule by mouth in the morning and at bedtime.    [provider]  aspirin 81 MG tablet Take 81 mg by mouth daily.     [provider]  azaTHIOprine (IMURAN) 50 MG tablet Take 25 mg by mouth See admin instructions. Take 25mg  by mouth on Monday, Wednesday, and Friday after dialysis    [provider]  calcium acetate (PHOSLO) 667 MG capsule Take 1,334 mg by mouth 3 (three) times daily before meals.     [provider]  cycloSPORINE (SANDIMMUNE) 25 MG capsule Take 50 mg by mouth 2 (two) times daily. Take 50 mg by mouth in the morning and 50 mg by mouth at night    [provider]  cycloSPORINE modified  (NEORAL) 25 MG capsule Take 2 capsules by mouth (50mg ) in the morning, and 3 capsules by mouth (75mg ) in the evening. 11/18/19   [provider]  dorzolamidel-timolol (COSOPT PF) 22.3-6.8 MG/ML SOLN ophthalmic solution Place 1 drop into both eyes 2 (two) times daily.    [provider]  fluticasone (FLONASE) 50 MCG/ACT nasal spray Place 2 sprays into the nose daily. Patient taking differently: Place 2 sprays into the nose daily as needed for allergies.  03/05/13   Wenda Low, MD  Immune Globulin 10% (PRIVIGEN) 20 GM/200ML SOLN Inject 0.5 g/kg into the vein every 3 (three) months.     [provider]  insulin regular (NOVOLIN R) 100 units/mL injection Inject 5 Units into the skin 3 (three) times daily before meals. Additional units (per sliding scale): BGL 201-250 = 1 unit; 251-300 = 2 units; 301-350 =  3 units; 351-400 = units; >401 = 5 units + CALL LUNG COORDINATOR @ [facility]    [provider]  loperamide (IMODIUM) 2 MG capsule Take 2 mg by mouth 3 (three) times daily as needed. 11/20/19   [provider]  midodrine (PROAMATINE) 5 MG tablet Take by mouth. 11/18/19   [provider]  Multiple Vitamin (MULTIVITAMIN) capsule Take 1 capsule by mouth as directed. Take only on (Sun, Tues, Thurs, Sat)    [provider]  omeprazole (PRILOSEC) 40 MG capsule Take 40 mg by mouth in the morning and at bedtime.  11/12/18   [provider]  oxyCODONE (OXY IR/ROXICODONE) 5 MG immediate release tablet Take 5 mg by mouth every 6 (six) hours as needed for moderate pain or severe pain.  08/31/19   [provider]  polyvinyl alcohol-povidone (REFRESH) 1.4-0.6 % ophthalmic solution Place 1-2 drops into both eyes daily as needed (for dryness).    [provider]  posaconazole (NOXAFIL) 100 MG TBEC delayed-release tablet Take 300 mg by mouth daily. 3 tablets daily at dinner. 06/14/19 06/13/20  [provider]  pravastatin (PRAVACHOL) 20  MG tablet Take 20 mg by mouth at bedtime.     [provider]  prednisoLONE acetate (PRED FORTE) 1 % ophthalmic suspension Place 1 drop into both eyes in the morning and at bedtime.  08/01/19   [provider]  predniSONE (DELTASONE) 5 MG tablet Take 5 mg by mouth daily with breakfast.    [provider]  pyridOXINE (VITAMIN B-6) 100 MG tablet Take 100 mg by mouth daily.    [provider]  sulfamethoxazole-trimethoprim (BACTRIM,SEPTRA) 400-80 MG tablet Take 1 tablet by mouth See admin instructions. Take one tablet by mouth every Monday, Wednesday, and Friday after dialysis 02/27/17   [provider]  traMADol (ULTRAM) 50 MG tablet SMARTSIG:0.5 Tablet(s) By Mouth Every 12 Hours PRN 11/20/19   [provider]  valGANciclovir (VALCYTE) 450 MG tablet Take 1 tablet by mouth See admin instructions. Take 1 tablet by mouth every Monday and Friday  After dialysis 03/26/15   [provider]    Allergies    Levofloxacin and Nsaids  Review of Systems   Review of Systems  Constitutional: Positive for activity change, appetite change, fatigue and fever.  HENT: Negative for congestion and rhinorrhea.   Eyes: Negative for visual disturbance.  Respiratory: Positive for cough. Negative for chest tightness.   Cardiovascular: Positive for leg swelling. Negative for chest pain.  Gastrointestinal: Positive for abdominal pain and diarrhea. Negative for nausea and vomiting.  Genitourinary: Negative for dysuria and hematuria.  Musculoskeletal: Positive for arthralgias, back pain and myalgias.  Neurological: Positive for weakness. Negative for dizziness and headaches.   all other systems are negative except as noted in the HPI and PMH.    Physical Exam Updated Vital Signs BP 104/76   Pulse 81   Temp (!) 101.7 F (38.7 C) (Oral)   Resp 20   Ht 5\' 3"  (1.6 m)   Wt 68 kg   SpO2 100%   BMI 26.57 kg/m   Physical Exam Vitals and nursing note  reviewed.  Constitutional:      General: He is not in acute distress.    Appearance: He is well-developed. He is ill-appearing.     Comments: Speaking in full sentences, no distress.  HENT:     Head: Normocephalic and atraumatic.     Mouth/Throat:     Pharynx: No oropharyngeal exudate.  Eyes:  Conjunctiva/sclera: Conjunctivae normal.     Pupils: Pupils are equal, round, and reactive to light.  Neck:     Comments: No meningismus. Cardiovascular:     Rate and Rhythm: Normal rate and regular rhythm.     Heart sounds: Normal heart sounds. No murmur heard.   Pulmonary:     Effort: Pulmonary effort is normal. No respiratory distress.     Breath sounds: Rhonchi present.     Comments: Diminished on R Abdominal:     Palpations: Abdomen is soft.     Tenderness: There is no abdominal tenderness. There is no guarding or rebound.  Musculoskeletal:        General: No tenderness. Normal range of motion.     Cervical back: Normal range of motion and neck supple.     Right lower leg: Edema present.     Left lower leg: Edema present.     Comments: Left upper extremity fistula with intact thrill and bruit  Skin:    General: Skin is warm.  Neurological:     Mental Status: He is alert and oriented to person, place, and time.     Cranial Nerves: No cranial nerve deficit.     Motor: No abnormal muscle tone.     Coordination: Coordination normal.     Comments: No ataxia on finger to nose bilaterally. No pronator drift. 5/5 strength throughout. CN 2-12 intact.Equal grip strength. Sensation intact.   Psychiatric:        Behavior: Behavior normal.     ED Results / Procedures / Treatments   Labs (all labs ordered are listed, but only abnormal results are displayed) Labs Reviewed  COMPREHENSIVE METABOLIC PANEL - Abnormal; Notable for the following components:      Result Value   Chloride 93 (*)    Glucose, Bld 197 (*)    BUN 44 (*)    Creatinine, Ser 8.78 (*)    Calcium 8.7 (*)    Total  Protein 6.0 (*)    Albumin 2.7 (*)    AST 47 (*)    Alkaline Phosphatase 235 (*)    Total Bilirubin 3.5 (*)    GFR calc non Af Amer 5 (*)    GFR calc Af Amer 6 (*)    Anion gap 16 (*)    All other components within normal limits  LACTIC ACID, PLASMA - Abnormal; Notable for the following components:   Lactic Acid, Venous 2.2 (*)    All other components within normal limits  LACTIC ACID, PLASMA - Abnormal; Notable for the following components:   Lactic Acid, Venous 3.6 (*)    All other components within normal limits  CBC WITH DIFFERENTIAL/PLATELET - Abnormal; Notable for the following components:   WBC 15.2 (*)    RBC 2.92 (*)    Hemoglobin 10.1 (*)    HCT 31.5 (*)    MCV 107.9 (*)    MCH 34.6 (*)    Neutro Abs 14.7 (*)    Lymphs Abs 0.0 (*)    All other components within normal limits  APTT - Abnormal; Notable for the following components:   aPTT 41 (*)    All other components within normal limits  SARS CORONAVIRUS 2 BY RT PCR (HOSPITAL ORDER, [facility])  CULTURE, BLOOD (ROUTINE X 2)  CULTURE, BLOOD (ROUTINE X 2)  URINE CULTURE  RESP PANEL BY RT PCR (RSV, FLU A&B, COVID)  GASTROINTESTINAL PANEL BY PCR, STOOL (REPLACES STOOL CULTURE)  C DIFFICILE  QUICK SCREEN W PCR REFLEX  PROTIME-INR  URINALYSIS, ROUTINE W REFLEX MICROSCOPIC  URINALYSIS, ROUTINE W REFLEX MICROSCOPIC  HEMOGLOBIN A1C  LACTIC ACID, PLASMA  COMPREHENSIVE METABOLIC PANEL  MAGNESIUM  CBC WITH DIFFERENTIAL/PLATELET    EKG EKG Interpretation  Date/Time:  Tuesday January 07 2020 19:29:07 EDT Ventricular Rate:  98 PR Interval:  138 QRS Duration: 136 QT Interval:  392 QTC Calculation: 500 R Axis:   -21 Text Interpretation: Normal sinus rhythm Right bundle branch block Abnormal ECG When compared with ECG of 09/03/2019, No significant change was found Confirmed by [name] ([id number]) on 01/07/2020 8:26:27 PM   Radiology DG Chest 2 View  Result Date: 01/07/2020 CLINICAL DATA:   Tachycardia, cough and shortness of breath x1 day. EXAM: CHEST - 2 VIEW COMPARISON:  September 03, 2019 FINDINGS: Stable moderate to marked severity diffusely increased interstitial opacities are again seen throughout the left lung. This is unchanged in appearance when compared to the prior exam. Mild left-sided volume loss is again noted. Very mild, stable right basilar atelectasis is noted. There is no evidence of a pleural effusion or pneumothorax. The heart size and mediastinal contours are within normal limits. Radiopaque surgical clips are seen overlying the right hilum. Radiopaque vascular stents are again seen just above the aortic arch and below the mid left clavicle. The visualized skeletal structures are unremarkable. IMPRESSION: Stable moderate to marked severity diffuse interstitial fibrosis within the left lung. Electronically Signed   By: Virgina Norfolk M.D.   On: 01/07/2020 22:30    Procedures .Critical Care Performed by: Ezequiel Essex, MD Authorized by: Ezequiel Essex, MD   Critical care provider statement:    Critical care time (minutes):  45   Critical care was necessary to treat or prevent imminent or life-threatening deterioration of the following conditions:  Sepsis   Critical care was time spent personally by me on the following activities:  Discussions with consultants, evaluation of patient's response to treatment, examination of patient, ordering and performing treatments and interventions, ordering and review of laboratory studies, ordering and review of radiographic studies, pulse oximetry, re-evaluation of patient's condition, obtaining history from patient or surrogate and review of old charts   (including critical care time)  Medications Ordered in ED Medications  metroNIDAZOLE (FLAGYL) IVPB 500 mg (has no administration in time range)  vancomycin (VANCOCIN) IVPB 1000 mg/200 mL premix (has no administration in time range)  0.9 %  sodium chloride infusion (has no  administration in time range)  sodium chloride 0.9 % bolus 250 mL (has no administration in time range)  acetaminophen (TYLENOL) tablet 650 mg (650 mg Oral Given 01/07/20 1934)    ED Course  I have reviewed the triage vital signs and the nursing notes.  Pertinent labs & imaging results that were available during my care of the patient were reviewed by me and considered in my medical decision making (see chart for details).    MDM Rules/Calculators/A&P                         Patient with dialysis as well as lung transplant here with a 1 day history of body aches, chills, fever, cough or shortness of breath.  He is febrile and tachycardic on arrival.  Code sepsis was activated. Chest x-ray shows stable fibrosis with obvious infiltrate. Labs do show slight elevation of bilirubin. Covid testing is negative.  Patient started on broad-spectrum antibiotics after cultures are obtained  D/w Dr. Robley Fries, [facility] pulmonary transplant.  He agrees with treatment broad-spectrum antibiotics blood cultures are pending.  Recommends adding on viral respiratory panel.  He states [facility] is a capacity but no indication for transfer unless something highly abnormal appears on CT scan.  Does agree with ruling out pulmonary embolism.  Patient unfortunately had extravasation of contrast to his right upper extremity when attempted CT scan.  He was reassessed by myself which shows some induration and swelling to his right arm without significant tenderness.  He is able to move his fingers and distal pulses are intact.  Anticipate admission to the hospital for IV antibiotics while cultures are pending.  Will need CT scan to exclude pulmonary embolism or pneumonia.  We will also obtain CT scan of the abdomen to evaluate intra-abdominal pathology given his elevated bilirubin and diarrhea. C. difficile studies will be sent.  Admission discussed with Dr. Marlyce Huge.  [name] was evaluated in Emergency Department on  01/08/2020 for the symptoms described in the history of present illness. He was evaluated in the context of the global COVID-19 pandemic, which necessitated consideration that the patient might be at risk for infection with the SARS-CoV-2 virus that causes COVID-19. Institutional protocols and algorithms that pertain to the evaluation of patients at risk for COVID-19 are in a state of rapid change based on information released by regulatory bodies including the CDC and federal and state organizations. These policies and algorithms were followed during the patient's care in the ED.  Final Clinical Impression(s) / ED Diagnoses Final diagnoses:  Febrile illness  Diarrhea of infectious origin    Rx / DC Orders ED Discharge Orders    None       Sheena Donegan, Annie Main, MD 01/08/20 [phone]

## 2020-01-08 NOTE — Procedures (Signed)
I was present at this dialysis session. I have reviewed the session itself and made appropriate changes.   Vital signs in last 24 hours:  Temp:  [101.7 F (38.7 C)] 101.7 F (38.7 C) (08/03 1925) Pulse Rate:  [73-101] 89 (08/04 1347) Resp:  [15-31] 16 (08/04 1347) BP: (104-141)/(53-76) 141/65 (08/04 1347) SpO2:  [85 %-100 %] 99 % (08/04 1347) Weight:  [68 kg] 68 kg (08/03 1926) Weight change:  Filed Weights   01/07/20 1926  Weight: 68 kg    Recent Labs  Lab 01/08/20 1059  NA 134*  K 3.7  CL 94*  CO2 27  GLUCOSE 103*  BUN 52*  CREATININE 9.50*  CALCIUM 8.1*    Recent Labs  Lab 01/07/20 2011 01/08/20 1059  WBC 15.2* 11.8*  NEUTROABS 14.7* 10.2*  HGB 10.1* 9.5*  HCT 31.5* 30.4*  MCV 107.9* 108.2*  PLT 156 125*    Scheduled Meds: . aspirin  81 mg Oral Daily  . calcium acetate  1,334 mg Oral TID AC  . Chlorhexidine Gluconate Cloth  6 each Topical Q0600  . dorzolamidel-timolol  1 drop Both Eyes BID  . fluticasone  2 spray Each Nare Daily  . heparin  5,000 Units Subcutaneous Q8H  . insulin aspart  0-9 Units Subcutaneous TID AC & HS  . insulin aspart  5 Units Subcutaneous QAC breakfast  . insulin aspart  6 Units Subcutaneous QAC lunch  . insulin aspart  6 Units Subcutaneous QAC supper  . midodrine  10 mg Oral Daily  . [START ON 01/09/2020] multivitamin with minerals  1 tablet Oral Q T,Th,S,Su  . pantoprazole  40 mg Oral BID  . prednisoLONE acetate  1 drop Both Eyes BID  . predniSONE  5 mg Oral Q breakfast  . pyridOXINE  100 mg Oral Daily  . sulfamethoxazole-trimethoprim  1 tablet Oral Q M,W,F-2000  . [START ON 01/10/2020] valGANciclovir  450 mg Oral Once per day on Mon Fri   Continuous Infusions: . ceFEPime (MAXIPIME) IV    . metronidazole 500 mg (01/08/20 1328)  . vancomycin Stopped (01/08/20 1317)   PRN Meds:.acetaminophen **OR** acetaminophen, iohexol, ondansetron **OR** ondansetron (ZOFRAN) IV, polyethylene glycol, polyvinyl alcohol-povidone, traMADol    Donetta Potts,  MD 01/08/2020, 2:40 PM

## 2020-01-08 NOTE — Progress Notes (Signed)
   Patient ID: [name], male   DOB: 25-Dec-1950, 69 y.o.   MRN: [id number]   This patient has received 100 ml's of IV Omnipaque 350 contrast extravasation into the right upper arm during a CT exam.  The exam was performed on 01/08/2020  Site / affected area assessed by myself.  CONSULTATION: I was called to assess patient following extravasation of contrast material in the CT department.  There was extravasation of 100 cc Omnipaque 350 contrast material into the right upper arm during contrast injection for a CT a chest and CT abdomen and pelvis.  The patient was encountered on the CT table in no acute distress.  The right antecubital IV had previously been removed.  There was mild oozing of contrast material from the IV site.  There was diffuse swelling of the upper arm.  No discoloration or blistering.  A distal ulnar pulse was obtained but the radial pulse was difficult to find.  The right hand is cold.  This likely results from the patient's dialysis fistula but correlation to previous physical examination is suggested to exclude any interval change.  Patient's mobility and strength were intact.  The patient did not complain of any significant pain or discomfort at the time of examination.  The patient was instructed to monitor the site and to alert his nurse or caregiver for any change in condition, including increased swelling, blistering, pain, or change in sensation.  Post extravasation orders were entered into epic.     IMPRESSION:  Extravasation of 100 cc nonionic contrast material into the right upper arm without acute complication.  Conservative management and observation is initiated.

## 2020-01-08 NOTE — Progress Notes (Signed)
PIV consult: 20g 2.5" angiocath placed in R forearm. Pt is not a candidate for midline due to HD catheter and fistula.

## 2020-01-08 NOTE — Progress Notes (Signed)
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 Work up thus far unrevealing. Sars Covid 19 and viral resp panel negative.  No evidence of diverticulitis on CT scan.  CTA chest negative for PE.  No clear evidence of pulmonary infiltrates.  Started on IV abx empirically in the ED. Blood cx negative to date.  Will continue to follow pending results and manage accordingly.  Please refer to H&P dictated by my partner Dr. Cyd Silence on 01/08/20 for further details of the assessment and plan.

## 2020-01-08 NOTE — Progress Notes (Signed)
Pharmacy Antibiotic Note  [name] is a 69 y.o. male with h/o lung transplant and ESRD on HD MWF admitted on 01/07/2020 with SOB/fever, possible sepsis .  Pharmacy has been consulted for Vancomycin  Dosing.   Plan: Vancomycin 1500 mg IV now, then 750 mg IV after HD  Height: 5\' 3"  (160 cm) Weight: 68 kg (150 lb) IBW/kg (Calculated) : 56.9  Temp (24hrs), Avg:101.7 F (38.7 C), Min:101.7 F (38.7 C), Max:101.7 F (38.7 C)  Recent Labs  Lab 01/07/20 2011 01/08/20 0524  WBC 15.2*  --   CREATININE 8.78*  --   LATICACIDVEN 2.2* 3.6*    Estimated Creatinine Clearance: 6.3 mL/min (A) (by C-G formula based on SCr of 8.78 mg/dL (H)).    Allergies  Allergen Reactions  . Levofloxacin Other (See Comments)    LOSS OF CONSCIOUSNESS  . Nsaids Other (See Comments)    Patient instructed not to take NSAID's after his lung transplant    Caryl Pina 01/08/2020 6:17 AM

## 2020-01-08 NOTE — ED Notes (Signed)
Pt does not make urine, no UA/culture will be collected

## 2020-01-08 NOTE — ED Notes (Signed)
Lunch Tray Ordered @ 1059.  

## 2020-01-08 NOTE — ED Notes (Signed)
Pt transported to CT ?

## 2020-01-08 NOTE — H&P (Signed)
History and Physical    [name] WCH:[id number] DOB: 09-Jun-1950 DOA: 01/07/2020  PCP: Wenda Low, MD  Patient coming from: Home   Chief Complaint:  Chief Complaint  Patient presents with  . 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  Patient explains that on Monday he began to experience generalized malaise and weakness.  Patient went to dialysis that day but states that his dialysis was ended early for reasons unclear to him.  Patient states that he went home afterwards and felt so exhausted that he went to bed and slept the majority of the day.  Over the following 24 hours patient's generalized weakness and malaise continue to persist.  As patient symptoms worsened he began progressively more lethargic.  Patient also explains that he developed associated frequent bouts of diarrhea.  Patient states that he had between 6 and 8 episodes of watery nonbloody diarrhea.  Patient is also complaining of concurrent lower abdominal pain, mild to moderate intensity, sore in quality, nonradiating without any alleviating or exacerbating factors.  Upon further questioning patient also complains of shortness of breath over the span of time, mild in intensity and associated with dry cough.  Patient denies sick contacts, recent travel or confirmed contact with COVID-19 infection.  Of note, patient has received both doses of his COVID-19 mRNA vaccination greater than 1 month ago.  Due to patient's progressively worsening lethargy, weakness  and additional symptoms as detailed above the patient eventually presented to [facility] emergency department for evaluation.  Upon evaluation in the emergency department patient was found to exhibit multiple SIRS criteria including fever of 101.7 F, tachycardia and leukocytosis of 15.  Patient was additionally found to be quite lethargic concerning for encephalopathy, hypoxic requiring initiation of supplemental oxygen and possessing a lactic acidosis.  Patient was initiated on broad-spectrum intravenous antibiotic therapy with cefepime and vancomycin and Flagyl.  Patient was provided with two 500 cc lactated Ringer boluses.  Emergency department provider discussed the case with Dr. Robley Fries (on call for Dr. Wynetta Emery) with transplant at [facility] who recommended CT imaging of the chest to rule out pulmonary embolism as well as CT imaging of the abdomen and pelvis.  He also recommended obtaining a respiratory panel, particularly testing the patient for RSV.  The hospitalist group was then called to assess the patient for admission the hospital.    Review of Systems:   Review of Systems  Constitutional: Positive for chills and malaise/fatigue.  Respiratory: Positive for cough and shortness of breath.   Gastrointestinal: Positive for abdominal pain and diarrhea.  Musculoskeletal: Positive for myalgias.  Neurological: Positive for weakness.  All other systems reviewed and are negative.     Past Medical History:  Diagnosis Date  . Aortic valve disorders   . Benign neoplasm of colon   . Degeneration of intervertebral disc, site unspecified   . Diabetes mellitus without complication ([place])   . 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 Esophageal reflux   . ESRD (end stage renal disease) on dialysis ([place])   . Essential hypertension   .  Obstructive sleep apnea (adult) (pediatric)   . Osteoarthrosis, unspecified whether generalized or localized, unspecified site   .  Other and unspecified hyperlipidemia   . Pneumonia    interstitial pneumonia  . Pulmonary fibrosis ([place])   . Renal disorder   . Respiratory failure with hypoxia ([place]) 12/2015  . Transplanted, lung ([place])   . Unspecified essential hypertension     Past Surgical History:  Procedure Laterality Date  . COLONOSCOPY WITH PROPOFOL N/A 11/07/2017   Procedure: COLONOSCOPY WITH PROPOFOL;  Surgeon: Ronnette Juniper, MD;  Location: [facility];  Service: Gastroenterology;  Laterality: N/A;  . LIGATION OF ARTERIOVENOUS  FISTULA Left 01/14/2019   Procedure: LIGATION OF ARTERIOVENOUS  FISTULA;  Surgeon: Marty Heck, MD;  Location: [place];  Service: Vascular;  Laterality: Left;  . LUNG BIOPSY  2010  . LUNG TRANSPLANT, SINGLE Right   . POLYPECTOMY  11/07/2017   Procedure: POLYPECTOMY;  Surgeon: Ronnette Juniper, MD;  Location: [facility];  Service: Gastroenterology;;     reports that he quit smoking about 17 years ago. His smoking use included cigarettes. He has a 6.00 pack-year smoking history. He has never used smokeless tobacco. He reports that he does not drink alcohol and does not use drugs.  Allergies  Allergen Reactions  . Levofloxacin Other (See Comments)    LOSS OF CONSCIOUSNESS  . Nsaids Other (See Comments)    Patient instructed not to take NSAID's after his lung transplant    Family History  Problem Relation Age of Onset  . Pancreatic cancer Brother   . Heart disease Father      Prior to Admission medications   Medication Sig Start Date End Date Taking? Authorizing Provider  acetaminophen (TYLENOL) 500 MG tablet Take 500-1,000 mg by mouth every 6 (six) hours as needed for moderate pain or headache.    Yes [provider]  Alpha Lipoic Acid 200 MG CAPS Take 200 mg by mouth in the morning and at bedtime.    Yes [provider]  aspirin 81 MG tablet Take 81 mg by mouth daily.    Yes [provider]  azaTHIOprine (IMURAN) 50 MG tablet Take 50 mg by mouth every  Monday, Wednesday, and Friday with hemodialysis.    Yes [provider]  calcium acetate (PHOSLO) 667 MG capsule Take 1,334 mg by mouth 3 (three) times daily before meals.    Yes [provider]  cycloSPORINE modified (GENGRAF) 25 MG capsule Take 75-100 mg by mouth See admin instructions. Take 3 capsules in the morning and take 4 capsules at night   Yes [provider]  dorzolamidel-timolol (COSOPT PF) 22.3-6.8 MG/ML SOLN ophthalmic solution Place 1 drop into both eyes 2 (two) times daily.   Yes [provider]  fluticasone (FLONASE) 50 MCG/ACT nasal spray Place 2 sprays into the nose daily. Patient taking differently: Place 2 sprays into the nose daily as needed for allergies.  03/05/13  Yes Wenda Low, MD  Immune Globulin 10% (PRIVIGEN) 20 GM/200ML SOLN Inject 0.5 g/kg into the vein every 3 (three) months.    Yes [provider]  insulin regular (NOVOLIN R) 100 units/mL injection Inject 5-6 Units into the skin See admin instructions. Use 5 units every morning, use 6 units at lunch and dinner and Additional units (per sliding scale): BGL 201-250 = 1 unit; 251-300 = 2 units; 301-350 = 3 units; 351-400 = units; >401 = 5 units + CALL LUNG COORDINATOR @ [facility]   Yes [provider]  loperamide (IMODIUM) 2 MG capsule Take 2 mg by mouth 3 (three) times daily as needed for diarrhea or loose stools.  11/20/19  Yes [provider]  midodrine (PROAMATINE) 5 MG tablet Take 5-10 mg by mouth See admin instructions. Take 2 tablets before dialysis and 1 tablet at night if needed 11/18/19  Yes [provider]  Multiple Vitamin (MULTIVITAMIN) capsule Take 1 capsule by mouth as directed. Take only on (Sun, Tues, Thurs, Sat)   Yes [provider]  omeprazole (PRILOSEC) 40 MG capsule Take 40 mg by mouth in the morning and at bedtime.  11/12/18  Yes [provider]  polyvinyl alcohol-povidone (REFRESH) 1.4-0.6 % ophthalmic solution Place  1-2 drops into both eyes daily as needed (for dryness).   Yes [provider]  pravastatin (PRAVACHOL) 20 MG tablet Take 20 mg by mouth at bedtime.    Yes [provider]  prednisoLONE acetate (PRED FORTE) 1 % ophthalmic suspension Place 1 drop into both eyes in the morning and at bedtime.  08/01/19  Yes [provider]  predniSONE (DELTASONE) 5 MG tablet Take 5 mg by mouth daily with breakfast.   Yes [provider]  pyridOXINE (VITAMIN B-6) 100 MG tablet Take 100 mg by mouth daily.   Yes [provider]  sulfamethoxazole-trimethoprim (BACTRIM,SEPTRA) 400-80 MG tablet Take 1 tablet by mouth See admin instructions. Take one tablet by mouth every Monday, Wednesday, and Friday after dialysis 02/27/17  Yes [provider]  traMADol (ULTRAM) 50 MG tablet Take 25 mg by mouth every 12 (twelve) hours as needed for moderate pain.  11/20/19  Yes [provider]  valGANciclovir (VALCYTE) 450 MG tablet Take 1 tablet by mouth See admin instructions. Take 1 tablet by mouth every Monday and Friday  After dialysis 03/26/15  Yes [provider]  ACCU-CHEK FASTCLIX LANCETS MISC As directed up to 4 times daily 12/16/15   [provider]    Physical Exam: Vitals:   01/08/20 0442 01/08/20 0514 01/08/20 0523 01/08/20 0551  BP:      Pulse: 79 75 74 73  Resp: 17 17 16 15   Temp:      TempSrc:      SpO2: 100% 100% 100% 100%  Weight:      Height:        Constitutional: Lethargic but arousable and oriented x3.  Patient is not in any acute distress.  Skin: no rashes, no lesions, good skin turgor noted. Eyes: Pupils are equally reactive to light.  No evidence of scleral icterus or conjunctival pallor.  ENMT: Somewhat dry mucous membranes noted.  Posterior pharynx clear of any exudate or lesions.   Neck: normal, supple, no masses, no thyromegaly.  No evidence of jugular venous distension.   Respiratory: Extremely coarse breath sounds noted  throughout the left lung fields.  Concerning the right lung fields, there is only minimal rales of the right base.  No evidence of wheezing.   Normal respiratory effort. No accessory muscle use.  Cardiovascular: Regular rate and rhythm, no murmurs / rubs / gallops. No extremity edema. 2+ pedal pulses. No carotid bruits.  Chest:   Nontender without crepitus or deformity.   Back:   Notable midline lumbar tenderness without crepitus or deformity. Abdomen: Notable tenderness of the lower abdomen in both the right and left quadrants.  Abdomen is soft however.   No evidence of intra-abdominal masses.  Positive bowel sounds noted in all quadrants.   Musculoskeletal: Some redness and very minimal induration  noted of the medial surface of the proximal right upper extremity. Good ROM, no contractures. Normal muscle tone.  Neurologic: Patient is lethargic but arousable and oriented x3.  Patient is able to move all 4 extremities spontaneously.  Patient is following commands.  Patient is responsive to verbal and painful stimuli.  Psychiatric: Patient presents as a depressed mood with flat affect.  Patient seems to possess insight as to theircurrent situation.     Labs on Admission: I have personally reviewed following labs and imaging studies -   CBC: Recent Labs  Lab 01/07/20 2011  WBC 15.2*  NEUTROABS 14.7*  HGB 10.1*  HCT 31.5*  MCV 107.9*  PLT 161   Basic Metabolic Panel: Recent Labs  Lab 01/07/20 2011  NA 137  K 3.7  CL 93*  CO2 28  GLUCOSE 197*  BUN 44*  CREATININE 8.78*  CALCIUM 8.7*   GFR: Estimated Creatinine Clearance: 6.3 mL/min (A) (by C-G formula based on SCr of 8.78 mg/dL (H)). Liver Function Tests: Recent Labs  Lab 01/07/20 2011  AST 47*  ALT 25  ALKPHOS 235*  BILITOT 3.5*  PROT 6.0*  ALBUMIN 2.7*   No results for input(s): LIPASE, AMYLASE in the last 168 hours. No results for input(s): AMMONIA in the last 168 hours. Coagulation Profile: Recent Labs  Lab  01/07/20 2011  INR 1.1   Cardiac Enzymes: No results for input(s): CKTOTAL, CKMB, CKMBINDEX, TROPONINI in the last 168 hours. BNP (last 3 results) No results for input(s): PROBNP in the last 8760 hours. HbA1C: No results for input(s): HGBA1C in the last 72 hours. CBG: No results for input(s): GLUCAP in the last 168 hours. Lipid Profile: No results for input(s): CHOL, HDL, LDLCALC, TRIG, CHOLHDL, LDLDIRECT in the last 72 hours. Thyroid Function Tests: No results for input(s): TSH, T4TOTAL, FREET4, T3FREE, THYROIDAB in the last 72 hours. Anemia Panel: No results for input(s): VITAMINB12, FOLATE, FERRITIN, TIBC, IRON, RETICCTPCT in the last 72 hours. Urine analysis:    Component Value Date/Time   COLORURINE AMBER (A) 12/31/2015 0515   APPEARANCEUR CLOUDY (A) 12/31/2015 0515   LABSPEC 1.016 12/31/2015 0515   PHURINE 7.0 12/31/2015 0515   GLUCOSEU NEGATIVE 12/31/2015 0515   HGBUR NEGATIVE 12/31/2015 0515   BILIRUBINUR SMALL (A) 12/31/2015 0515   KETONESUR 15 (A) 12/31/2015 0515   PROTEINUR 100 (A) 12/31/2015 0515   UROBILINOGEN 2.0 (H) 08/15/2013 1916   NITRITE NEGATIVE 12/31/2015 0515   LEUKOCYTESUR NEGATIVE 12/31/2015 0515    Radiological Exams on Admission - Personally Reviewed: DG Chest 2 View  Result Date: 01/07/2020 CLINICAL DATA:  Tachycardia, cough and shortness of breath x1 day. EXAM: CHEST - 2 VIEW COMPARISON:  September 03, 2019 FINDINGS: Stable moderate to marked severity diffusely increased interstitial opacities are again seen throughout the left lung. This is unchanged in appearance when compared to the prior exam. Mild left-sided volume loss is again noted. Very mild, stable right basilar atelectasis is noted. There is no evidence of a pleural effusion or pneumothorax. The heart size and mediastinal contours are within normal limits. Radiopaque surgical clips are seen overlying the right hilum. Radiopaque vascular stents are again seen just above the aortic arch and below  the mid left clavicle. The visualized skeletal structures are unremarkable. IMPRESSION: Stable moderate to marked severity diffuse interstitial fibrosis within the left lung. Electronically Signed   By: Virgina Norfolk M.D.   On: 01/07/2020 22:30    EKG: Personally reviewed.  Rhythm is normal sinus rhythm with heart rate  of 98 bpm.  Right bundle branch block present.  No dynamic ST segment changes appreciated.  Assessment/Plan Principal Problem:   Sepsis with acute organ dysfunction [facility])   Patient presenting with multiple sirs criteria including substantial leukocytosis, tachycardia and fever.  This is in combination with multiorgan dysfunction including acute metabolic encephalopathy and acute hypoxic respiratory failure.  Patient is suffering from sepsis, source unknown at this time.  Patient is profoundly immunocompromised due to known history of lung transplant on immunosuppressive therapy including azathioprine, cyclosporine and prednisone.  Patient's hypoxia on arrival suggest that patient may have developed a pneumonia.  Chest x-ray reveals no obvious evidence of new infiltrate however there may be a brewing pneumonia in the patient's left lung which is obscured by chronic infiltrates on chest x-ray.  Additionally, patient is complaining of a 1 day history of watery diarrhea so a concurrent infectious colitis is also possible.  Case was discussed with covering transplant physician at [facility] by the emergency department provider.  This was Dr. Robley Fries covering for Dr. Wynetta Emery.  They recommended obtaining CT angiogram of the chest as well as CT imaging of the abdomen to identify for any evidence of pulmonary emboli or sources of infection.  It is also recommended that the patient undergo a respiratory viral panel including testing for RSV  COVID-19 PCR testing negative.  Patient is receiving broad-spectrum intravenous antibiotic therapy with cefepime, metronidazole and  vancomycin  Providing patient with supplemental oxygen for associated hypoxia  Blood cultures have been obtained.  Patient would likely benefit from infectious disease consultation on day shift.  Active Problems:   Acute respiratory failure with hypoxia [facility])   Patient presenting with new onset hypoxia.  Patient is not on oxygen supplementation at home.  Hypoxia possibly caused by new onset of pulmonary infection.  Additionally pulmonary embolism is also possible but less likely.  Please see assessment and plan above otherwise.    Lactic acidosis   Patient is exhibiting substantial lactic acidosis.  Lactic acid was initially 2.2 and seems to be trending upward at 3.6.  A second 500 cc bolus of IV fluids have been ordered by the emergency department provider in response to the rise in lactic acid.  Providing patient with broad-spectrum antibiotic therapy for underlying infection  Performing serial lactic acid levels to ensure downtrending and resolution.    Acute diarrhea   Patient complains of a 1 day history of watery diarrhea.  Additionally, patient has lower abdominal tenderness on examination.  This is possibly a source of infection of this patient, patient undergo CT imaging of the abdomen and pelvis.  Stool studies have also been ordered including stool GI PCR panel and C. difficile testing.    Acute metabolic encephalopathy   Patient is quite lethargic and somewhat of a poor historian upon my interview in the emergency department.  Patient is likely stable from acute metabolic encephalopathy secondary to underlying sepsis  Treating underlying infection and monitor for symptomatic improvement.  Expand work-up for alternative causes if mentation does not rapidly improve.    End-stage renal disease on hemodialysis [facility])   We will need to consult nephrology on day team for  resumption of hemodialysis during this hospitalization.    Hepatic cirrhosis  ([place])   New finding on right upper quadrant ultrasound performed last week after being ordered by patient's nephrologist in response to findings of hyperbilirubinemia and rising transaminases.  Patient continues to exhibit elevated alkaline phosphatase, AST, ALT and rising hyperbilirubinemia.  This is possibly  secondary to hepatocellular injury from patient's azathioprine or possibly patient's cyclosporine.  We will temporarily hold these drugs and likely formally consult gastroenterology on day team for further input.  If [facility] is abnormal LFTs to patient's immunosuppressive therapy then this will need to be discussed with patient's transplant provider Dr. Wynetta Emery at [facility].  Patient denies alcohol use    Uncontrolled type 2 diabetes mellitus with hyperglycemia, with long-term current use of insulin ([place])   Accu-Cheks before every meal and nightly with sliding scale insulin  Continue patient's home regimen of NovoLog with each meal.    IV infiltrate, initial encounter   Patient unfortunately experienced a right upper extremity small volume infiltration of contrast upon first attempt of performing contrast CTs in the emergency department.  Patient was immediately evaluated by Dr. Gerilyn Nestle with radiology who placed extravasation protocol orders and recommended conservative management.    GERD without esophagitis   Continue home regimen of PPI  Of note, patient has known history of gastrointestinal bleeding secondary to bleeding duodenal ulcer.    Mixed diabetic hyperlipidemia associated with type 2 diabetes mellitus (HCC)  Temporarily holding statin therapy as we further evaluate the etiology of patient's early cirrhosis and abnormal LFTs.    Code Status:  Full code Family Communication: Wife has been updated on plan of care by the emergency department staff.  Status is: Inpatient  Remains inpatient appropriate because:Altered mental status, Ongoing  diagnostic testing needed not appropriate for outpatient work up, IV treatments appropriate due to intensity of illness or inability to take PO and Inpatient level of care appropriate due to severity of illness   Dispo: The patient is from: Home              Anticipated d/c is to: Home              Anticipated d/c date is: > 3 days              Patient currently is not medically stable to d/c.        Vernelle Emerald MD [facility] Pager [phone]  If 7PM-7AM, please contact night-coverage [email or web address] Use universal [place] password for that web site. If you do not have the password, please call the hospital operator.  01/08/2020, 7:37 AM

## 2020-01-08 NOTE — Consult Note (Addendum)
[facility] Renal Consultation Note    Indication for Consultation:  Management of ESRD/hemodialysis; anemia, hypertension/volume and secondary hyperparathyroidism  [id number], Denton Ar, MD  HPI: [name] is a 69 y.o. male. ESRD on HD MWF at [facility].  Past medical history significant for ILD s/p single lung transplant in 10/2014, Hx GIB 2/2 duodenal ulcer, HTN, tremor, DMT2, GERD, HLD, HX DVT (not on anticoagulation), depression, OSA, orthostatic hypotension (on midodrine), and recurrent pulmonary aspergillosis last Dx in 06/2019.  Of note, patient is compliant with prescribed dialysis regimen.  Treatments tolerated well using LU AVF and typically meets his dry weight.   Patient seen and examined at bedside.  Presented to the ED due to weakness and lethargy.  Reports he woke feeling poorly on Monday with weakness, fatigue and overall feeling "lethargic".  He completed 3 of 4 hours dialysis but reported his blood pressure was low on arrival and throughout treatment.  His symptoms have not improved and he also developed diarrhea "every 30 min", nausea, abdominal pain, confusion, anorexia and chills.  Currently he is also a little short of breath after walking to the restroom and back.  Denies CP, dizziness, vomiting, edema, orthopnea and known sick contacts.  Reports prior to Monday he was in his normal state of health.     Pertinent findings in the ED include fever 101.7, tachycardia, hypotension, hypoxia requiring 2L O2, WBC 15.2, lactic acid 3.6, neg respiratory panel, CXR with chronic changes in L lung, no PE on CTA, and no acute abnormalities on CT abd.  Patient has been admitted for further evaluation and management.    Past Medical History:  Diagnosis Date  . Aortic valve disorders   . Benign neoplasm of colon   . Degeneration of intervertebral disc, site unspecified   . Diabetes mellitus without complication ([place])   . Diaphragmatic hernia without mention of  obstruction or gangrene   . Esophageal reflux   . ESRD (end stage renal disease) on dialysis ([place])   . Essential hypertension   . Obstructive sleep apnea (adult) (pediatric)   . Osteoarthrosis, unspecified whether generalized or localized, unspecified site   . Other and unspecified hyperlipidemia   . Pneumonia    interstitial pneumonia  . Pulmonary fibrosis ([place])   . Renal disorder   . Respiratory failure with hypoxia ([place]) 12/2015  . Transplanted, lung ([place])   . Unspecified essential hypertension    Past Surgical History:  Procedure Laterality Date  . COLONOSCOPY WITH PROPOFOL N/A 11/07/2017   Procedure: COLONOSCOPY WITH PROPOFOL;  Surgeon: Ronnette Juniper, MD;  Location: [facility];  Service: Gastroenterology;  Laterality: N/A;  . LIGATION OF ARTERIOVENOUS  FISTULA Left 01/14/2019   Procedure: LIGATION OF ARTERIOVENOUS  FISTULA;  Surgeon: Marty Heck, MD;  Location: [place];  Service: Vascular;  Laterality: Left;  . LUNG BIOPSY  2010  . LUNG TRANSPLANT, SINGLE Right   . POLYPECTOMY  11/07/2017   Procedure: POLYPECTOMY;  Surgeon: Ronnette Juniper, MD;  Location: [facility];  Service: Gastroenterology;;   Family History  Problem Relation Age of Onset  . Pancreatic cancer Brother   . Heart disease Father    Social History:  reports that he quit smoking about 17 years ago. His smoking use included cigarettes. He has a 6.00 pack-year smoking history. He has never used smokeless tobacco. He reports that he does not drink alcohol and does not use drugs. Allergies  Allergen Reactions  . Levofloxacin Other (See Comments)    LOSS OF  CONSCIOUSNESS  . Nsaids Other (See Comments)    Patient instructed not to take NSAID's after his lung transplant   Prior to Admission medications   Medication Sig Start Date End Date Taking? Authorizing Provider  acetaminophen (TYLENOL) 500 MG tablet Take 500-1,000 mg by mouth every 6 (six) hours as needed for moderate pain or headache.    Yes [provider]  Alpha Lipoic Acid 200 MG CAPS Take 200 mg by mouth in the morning and at bedtime.    Yes [provider]  aspirin 81 MG tablet Take 81 mg by mouth daily.    Yes [provider]  azaTHIOprine (IMURAN) 50 MG tablet Take 50 mg by mouth every Monday, Wednesday, and Friday with hemodialysis.    Yes [provider]  calcium acetate (PHOSLO) 667 MG capsule Take 1,334 mg by mouth 3 (three) times daily before meals.    Yes [provider]  cycloSPORINE modified (GENGRAF) 25 MG capsule Take 75-100 mg by mouth See admin instructions. Take 3 capsules in the morning and take 4 capsules at night   Yes [provider]  dorzolamidel-timolol (COSOPT PF) 22.3-6.8 MG/ML SOLN ophthalmic solution Place 1 drop into both eyes 2 (two) times daily.   Yes [provider]  fluticasone (FLONASE) 50 MCG/ACT nasal spray Place 2 sprays into the nose daily. Patient taking differently: Place 2 sprays into the nose daily as needed for allergies.  03/05/13  Yes Wenda Low, MD  Immune Globulin 10% (PRIVIGEN) 20 GM/200ML SOLN Inject 0.5 g/kg into the vein every 3 (three) months.    Yes [provider]  insulin regular (NOVOLIN R) 100 units/mL injection Inject 5-6 Units into the skin See admin instructions. Use 5 units every morning, use 6 units at lunch and dinner and Additional units (per sliding scale): BGL 201-250 = 1 unit; 251-300 = 2 units; 301-350 = 3 units; 351-400 = units; >401 = 5 units + CALL LUNG COORDINATOR @ [facility]   Yes [provider]  loperamide (IMODIUM) 2 MG capsule Take 2 mg by mouth 3 (three) times daily as needed for diarrhea or loose stools.  11/20/19  Yes [provider]  midodrine (PROAMATINE) 5 MG tablet Take 5-10 mg by mouth See admin instructions. Take 2 tablets before dialysis and 1 tablet at night if needed 11/18/19  Yes [provider]  Multiple Vitamin (MULTIVITAMIN) capsule Take 1 capsule by mouth as  directed. Take only on (Sun, Tues, Thurs, Sat)   Yes [provider]  omeprazole (PRILOSEC) 40 MG capsule Take 40 mg by mouth in the morning and at bedtime.  11/12/18  Yes [provider]  polyvinyl alcohol-povidone (REFRESH) 1.4-0.6 % ophthalmic solution Place 1-2 drops into both eyes daily as needed (for dryness).   Yes [provider]  pravastatin (PRAVACHOL) 20 MG tablet Take 20 mg by mouth at bedtime.    Yes [provider]  prednisoLONE acetate (PRED FORTE) 1 % ophthalmic suspension Place 1 drop into both eyes in the morning and at bedtime.  08/01/19  Yes [provider]  predniSONE (DELTASONE) 5 MG tablet Take 5 mg by mouth daily with breakfast.   Yes [provider]  pyridOXINE (VITAMIN B-6) 100 MG tablet Take 100 mg by mouth daily.   Yes [provider]  sulfamethoxazole-trimethoprim (BACTRIM,SEPTRA) 400-80 MG tablet Take 1 tablet by mouth See admin instructions. Take one tablet by mouth every Monday, Wednesday, and Friday after dialysis 02/27/17  Yes [provider]  traMADol (ULTRAM) 50 MG tablet Take 25 mg by mouth every 12 (twelve) hours as needed for moderate pain.  11/20/19  Yes [provider]  valGANciclovir (VALCYTE) 450 MG tablet Take 1 tablet by mouth See admin instructions. Take 1 tablet by mouth every Monday and Friday  After dialysis 03/26/15  Yes [provider]  [place] As directed up to 4 times daily 12/16/15   [provider]   Current Facility-Administered Medications  Medication Dose Route Frequency Provider Last Rate Last Admin  . acetaminophen (TYLENOL) tablet 650 mg  650 mg Oral Q6H PRN Shalhoub, Sherryll Burger, MD       Or  . acetaminophen (TYLENOL) suppository 650 mg  650 mg Rectal Q6H PRN Shalhoub, Sherryll Burger, MD      . aspirin chewable tablet 81 mg  81 mg Oral Daily Shalhoub, Sherryll Burger, MD   81 mg at 01/08/20 1019  . calcium acetate (PHOSLO) capsule 1,334  mg  1,334 mg Oral TID AC Shalhoub, Sherryll Burger, MD   1,334 mg at 01/08/20 1017  . ceFEPIme (MAXIPIME) 1 g in sodium chloride 0.9 % 100 mL IVPB  1 g Intravenous Q24H Rumbarger, Valeda Malm, RPH      . Chlorhexidine Gluconate Cloth 2 % PADS 6 each  6 each Topical Q0600 Penninger, Ria Comment, PA      . dorzolamidel-timolol (COSOPT) 22.3-6.8 MG/ML ophthalmic solution SOLN 1 drop  1 drop Both Eyes BID Shalhoub, Sherryll Burger, MD      . fluticasone (FLONASE) 50 MCG/ACT nasal spray 2 spray  2 spray Each Nare Daily Shalhoub, Sherryll Burger, MD      . heparin injection 5,000 Units  5,000 Units Subcutaneous Q8H Shalhoub, Sherryll Burger, MD      . insulin aspart (novoLOG) injection 0-9 Units  0-9 Units Subcutaneous TID AC & HS Shalhoub, Sherryll Burger, MD      . insulin aspart (novoLOG) injection 5 Units  5 Units Subcutaneous QAC breakfast Shalhoub, Sherryll Burger, MD      . insulin aspart (novoLOG) injection 6 Units  6 Units Subcutaneous QAC lunch Shalhoub, Sherryll Burger, MD      . insulin aspart (novoLOG) injection 6 Units  6 Units Subcutaneous QAC supper Shalhoub, Sherryll Burger, MD      . iohexol (OMNIPAQUE) 350 MG/ML injection 100 mL  100 mL Intravenous Once PRN Rancour, Stephen, MD      . metroNIDAZOLE (FLAGYL) IVPB 500 mg  500 mg Intravenous Q8H Shalhoub, Sherryll Burger, MD      . midodrine (PROAMATINE) tablet 10 mg  10 mg Oral Daily Shalhoub, Sherryll Burger, MD   10 mg at 01/08/20 1032  . [START ON 01/09/2020] multivitamin with minerals tablet 1 tablet  1 tablet Oral Q T,Th,S,Su Shalhoub, Sherryll Burger, MD      . ondansetron [facility]) tablet 4 mg  4 mg Oral Q6H PRN Shalhoub, Sherryll Burger, MD       Or  . ondansetron [facility]) injection 4 mg  4 mg Intravenous Q6H PRN Shalhoub, Sherryll Burger, MD      . pantoprazole (PROTONIX) EC tablet 40 mg  40 mg Oral BID Vernelle Emerald, MD   40 mg at 01/08/20 1019  . polyethylene glycol (MIRALAX / GLYCOLAX) packet 17 g  17 g Oral Daily PRN Shalhoub, Sherryll Burger, MD      . polyvinyl alcohol-povidone (HYPOTEARS) ophthalmic solution 1-2 drop  1-2  drop Both Eyes Daily PRN Shalhoub, Sherryll Burger, MD      .  prednisoLONE acetate (PRED FORTE) 1 % ophthalmic suspension 1 drop  1 drop Both Eyes BID Shalhoub, Sherryll Burger, MD      . predniSONE (DELTASONE) tablet 5 mg  5 mg Oral Q breakfast Shalhoub, Sherryll Burger, MD   5 mg at 01/08/20 1018  . pyridOXINE (VITAMIN B-6) tablet 100 mg  100 mg Oral Daily Shalhoub, Sherryll Burger, MD   100 mg at 01/08/20 1019  . sulfamethoxazole-trimethoprim (BACTRIM) 400-80 MG per tablet 1 tablet  1 tablet Oral Q M,W,F-2000 Shalhoub, Sherryll Burger, MD      . traMADol Veatrice Bourbon) tablet 25 mg  25 mg Oral Q6H PRN Vernelle Emerald, MD      . Derrill Memo ON 01/10/2020] valGANciclovir (VALCYTE) 450 MG tablet TABS 450 mg  450 mg Oral Once per day on Mon Fri Shalhoub, Sherryll Burger, MD      . vancomycin (VANCOCIN) IVPB 750 mg/150 ml premix  750 mg Intravenous Q M,W,F-HD Shalhoub, Sherryll Burger, MD       Current Outpatient Medications  Medication Sig Dispense Refill  . acetaminophen (TYLENOL) 500 MG tablet Take 500-1,000 mg by mouth every 6 (six) hours as needed for moderate pain or headache.     Ilean Skill Lipoic Acid 200 MG CAPS Take 200 mg by mouth in the morning and at bedtime.     Marland Kitchen aspirin 81 MG tablet Take 81 mg by mouth daily.     Marland Kitchen azaTHIOprine (IMURAN) 50 MG tablet Take 50 mg by mouth every Monday, Wednesday, and Friday with hemodialysis.     Marland Kitchen calcium acetate (PHOSLO) 667 MG capsule Take 1,334 mg by mouth 3 (three) times daily before meals.     . cycloSPORINE modified (GENGRAF) 25 MG capsule Take 75-100 mg by mouth See admin instructions. Take 3 capsules in the morning and take 4 capsules at night    . dorzolamidel-timolol (COSOPT PF) 22.3-6.8 MG/ML SOLN ophthalmic solution Place 1 drop into both eyes 2 (two) times daily.    . fluticasone (FLONASE) 50 MCG/ACT nasal spray Place 2 sprays into the nose daily. (Patient taking differently: Place 2 sprays into the nose daily as needed for allergies. ) 30 g 2  . Immune Globulin 10% (PRIVIGEN) 20 GM/200ML SOLN Inject  0.5 g/kg into the vein every 3 (three) months.     . insulin regular (NOVOLIN R) 100 units/mL injection Inject 5-6 Units into the skin See admin instructions. Use 5 units every morning, use 6 units at lunch and dinner and Additional units (per sliding scale): BGL 201-250 = 1 unit; 251-300 = 2 units; 301-350 = 3 units; 351-400 = units; >401 = 5 units + CALL LUNG COORDINATOR @ [facility]    . loperamide (IMODIUM) 2 MG capsule Take 2 mg by mouth 3 (three) times daily as needed for diarrhea or loose stools.     . midodrine (PROAMATINE) 5 MG tablet Take 5-10 mg by mouth See admin instructions. Take 2 tablets before dialysis and 1 tablet at night if needed    . Multiple Vitamin (MULTIVITAMIN) capsule Take 1 capsule by mouth as directed. Take only on (Sun, Tues, Thurs, Sat)    . omeprazole (PRILOSEC) 40 MG capsule Take 40 mg by mouth in the morning and at bedtime.     . polyvinyl alcohol-povidone (REFRESH) 1.4-0.6 % ophthalmic solution Place 1-2 drops into both eyes daily as needed (for dryness).    . pravastatin (PRAVACHOL) 20 MG tablet Take 20 mg by mouth at bedtime.     . prednisoLONE  acetate (PRED FORTE) 1 % ophthalmic suspension Place 1 drop into both eyes in the morning and at bedtime.     . predniSONE (DELTASONE) 5 MG tablet Take 5 mg by mouth daily with breakfast.    . pyridOXINE (VITAMIN B-6) 100 MG tablet Take 100 mg by mouth daily.    Marland Kitchen sulfamethoxazole-trimethoprim (BACTRIM,SEPTRA) 400-80 MG tablet Take 1 tablet by mouth See admin instructions. Take one tablet by mouth every Monday, Wednesday, and Friday after dialysis    . traMADol (ULTRAM) 50 MG tablet Take 25 mg by mouth every 12 (twelve) hours as needed for moderate pain.     . valGANciclovir (VALCYTE) 450 MG tablet Take 1 tablet by mouth See admin instructions. Take 1 tablet by mouth every Monday and Friday  After dialysis    . ACCU-CHEK FASTCLIX LANCETS MISC As directed up to 4 times daily  3   Labs: Basic Metabolic Panel: Recent Labs  Lab  01/07/20 2011 01/08/20 1059  NA 137 134*  K 3.7 3.7  CL 93* 94*  CO2 28 27  GLUCOSE 197* 103*  BUN 44* 52*  CREATININE 8.78* 9.50*  CALCIUM 8.7* 8.1*   Liver Function Tests: Recent Labs  Lab 01/07/20 2011 01/08/20 1059  AST 47* 34  ALT 25 22  ALKPHOS 235* 212*  BILITOT 3.5* 2.7*  PROT 6.0* 5.5*  ALBUMIN 2.7* 2.4*   CBC: Recent Labs  Lab 01/07/20 2011 01/08/20 1059  WBC 15.2* 11.8*  NEUTROABS 14.7* 10.2*  HGB 10.1* 9.5*  HCT 31.5* 30.4*  MCV 107.9* 108.2*  PLT 156 125*   CBG: Recent Labs  Lab 01/08/20 0810  GLUCAP 92   Studies/Results: DG Chest 2 View  Result Date: 01/07/2020 CLINICAL DATA:  Tachycardia, cough and shortness of breath x1 day. EXAM: CHEST - 2 VIEW COMPARISON:  September 03, 2019 FINDINGS: Stable moderate to marked severity diffusely increased interstitial opacities are again seen throughout the left lung. This is unchanged in appearance when compared to the prior exam. Mild left-sided volume loss is again noted. Very mild, stable right basilar atelectasis is noted. There is no evidence of a pleural effusion or pneumothorax. The heart size and mediastinal contours are within normal limits. Radiopaque surgical clips are seen overlying the right hilum. Radiopaque vascular stents are again seen just above the aortic arch and below the mid left clavicle. The visualized skeletal structures are unremarkable. IMPRESSION: Stable moderate to marked severity diffuse interstitial fibrosis within the left lung. Electronically Signed   By: Virgina Norfolk M.D.   On: 01/07/2020 22:30   CT Angio Chest PE W and/or Wo Contrast  Result Date: 01/08/2020 CLINICAL DATA:  Shortness of breath; diarrhea EXAM: CT ANGIOGRAPHY CHEST CT ABDOMEN AND PELVIS WITH CONTRAST TECHNIQUE: Multidetector CT imaging of the chest was performed using the standard protocol during bolus administration of intravenous contrast. Multiplanar CT image reconstructions and MIPs were obtained to evaluate the  vascular anatomy. Multidetector CT imaging of the abdomen and pelvis was performed using the standard protocol during bolus administration of intravenous contrast. CONTRAST:  119mL OMNIPAQUE IOHEXOL 350 MG/ML SOLN COMPARISON:  Chest radiograph January 07, 2020;. CT angiogram chest September 03, 2019; CT abdomen and pelvis September 02, 2015 FINDINGS: CTA CHEST FINDINGS Cardiovascular: No demonstrable pulmonary embolus. No appreciable thoracic aortic aneurysm or dissection. There are scattered foci of calcification in visualized great vessels as well as in the aorta. Note that there is a stent in the left innominate vein. There is also a stent in the left  axillary vein. There are multiple foci of coronary artery calcification. There is left ventricular hypertrophy. No pericardial thickening or effusion. The main pulmonary outflow tract measures 3.2 cm in diameter, prominent. Mediastinum/Nodes: Thyroid appears unremarkable. No appreciable thoracic adenopathy. No esophageal lesions are evident. Lungs/Pleura: There is fibrosis throughout most of the left lung, similar to prior study. There is atelectatic change in the right base. There is no new opacity to suggest edema or consolidation. No evident pleural effusion. Varicoid bronchiectatic change noted throughout the left lung. Note that there is left lung volume loss. Musculoskeletal: There are foci of degenerative change in the thoracic spine. No blastic or lytic bone lesions. No chest wall lesions appreciable. Review of the MIP images confirms the above findings. CT ABDOMEN and PELVIS FINDINGS Hepatobiliary: Liver contour is subtly lobular with question of a degree of underlying hepatic cirrhosis. No focal liver lesions are appreciable. There are apparent small gallstones within the gallbladder. The gallbladder wall does not appear appreciably thickened. There is no biliary duct dilatation. Pancreas: There is no evident pancreatic mass or inflammatory focus. Spleen: No splenic  lesions are appreciable. Adrenals/Urinary Tract: Adrenals bilaterally appear normal. There is no evident renal mass or hydronephrosis on either side. There is a 2 mm calculus in the mid right kidney. There are several adjacent calculi in the lower pole the right kidney measuring between 1 and 4 mm in size. There is a 2 mm calculus in the mid left kidney. There is no appreciable ureteral calculus on either side. Urinary bladder wall is diffusely thickened. Note that urinary bladder is nearly empty at this time. Stomach/Bowel: There are diverticula in the descending colon and sigmoid colon without wall thickening or changes suggesting diverticulitis. Elsewhere there is no appreciable bowel wall mesenteric thickening. No evident bowel obstruction. The terminal ileum appears normal. There is no appreciable free air or portal venous air. Vascular/Lymphatic: No abdominal aortic aneurysm. There is extensive aortic and iliac artery atherosclerosis. There is also extensive calcification in more distal pelvic arterial vessels. Major venous structures appear patent. There is no appreciable adenopathy in the abdomen or pelvis. Reproductive: Prostate has a somewhat inhomogeneous echotexture with prominence of the prostate. A well-defined prostatic mass is not seen by CT. The seminal vesicles appear unremarkable. Other: The appendix appears normal. No evident abscess or ascites in the abdomen or pelvis. There is mild fat in the umbilicus. Musculoskeletal: There is degenerative change in the lumbar spine. No blastic or lytic bone lesions. No intramuscular lesions are evident. Review of the MIP images confirms the above findings. IMPRESSION: CT angiogram chest: 1. No demonstrable pulmonary embolus. No thoracic aortic aneurysm or dissection. There is aortic atherosclerosis as well as foci of great vessel and coronary artery calcification. Stents in the left innominate and axillary veins noted. 2. Widespread fibrosis with volume loss  throughout the left lung. Varicoid bronchiectatic change throughout the left lung, stable. Right lung clear except for scattered areas of atelectatic change. 3. Prominence of the main pulmonary outflow tract, a finding indicative of pulmonary arterial hypertension. 4.  No adenopathy. CT abdomen and pelvis: 1. Liver contour is subtly lobular. Question a degree of underlying hepatic cirrhosis. No focal liver lesions are appreciable. 2.  Small gallstones.  No gallbladder wall thickening evident. 3. Left colonic diverticula without diverticulitis. No bowel wall thickening or bowel obstruction. No abscess in the abdomen or pelvis. Appendix appears normal. 4. Nonobstructing calculi in each kidney. No hydronephrosis or ureteral calculus on either side. Generalized urinary bladder wall thickening,  likely due to a degree of cystitis. 5. Somewhat inhomogeneous attenuation of the prostate. Significance of this finding uncertain. Clinical assessment of prostate and appropriate laboratory correlation advised in this regard. 6. Aortic Atherosclerosis (ICD10-I70.0). There is extensive pelvic arterial vascular calcification bilaterally. Electronically Signed   By: Lowella Grip III M.D.   On: 01/08/2020 10:14   CT ABDOMEN PELVIS W CONTRAST  Result Date: 01/08/2020 CLINICAL DATA:  Shortness of breath; diarrhea EXAM: CT ANGIOGRAPHY CHEST CT ABDOMEN AND PELVIS WITH CONTRAST TECHNIQUE: Multidetector CT imaging of the chest was performed using the standard protocol during bolus administration of intravenous contrast. Multiplanar CT image reconstructions and MIPs were obtained to evaluate the vascular anatomy. Multidetector CT imaging of the abdomen and pelvis was performed using the standard protocol during bolus administration of intravenous contrast. CONTRAST:  144mL OMNIPAQUE IOHEXOL 350 MG/ML SOLN COMPARISON:  Chest radiograph January 07, 2020;. CT angiogram chest September 03, 2019; CT abdomen and pelvis September 02, 2015 FINDINGS:  CTA CHEST FINDINGS Cardiovascular: No demonstrable pulmonary embolus. No appreciable thoracic aortic aneurysm or dissection. There are scattered foci of calcification in visualized great vessels as well as in the aorta. Note that there is a stent in the left innominate vein. There is also a stent in the left axillary vein. There are multiple foci of coronary artery calcification. There is left ventricular hypertrophy. No pericardial thickening or effusion. The main pulmonary outflow tract measures 3.2 cm in diameter, prominent. Mediastinum/Nodes: Thyroid appears unremarkable. No appreciable thoracic adenopathy. No esophageal lesions are evident. Lungs/Pleura: There is fibrosis throughout most of the left lung, similar to prior study. There is atelectatic change in the right base. There is no new opacity to suggest edema or consolidation. No evident pleural effusion. Varicoid bronchiectatic change noted throughout the left lung. Note that there is left lung volume loss. Musculoskeletal: There are foci of degenerative change in the thoracic spine. No blastic or lytic bone lesions. No chest wall lesions appreciable. Review of the MIP images confirms the above findings. CT ABDOMEN and PELVIS FINDINGS Hepatobiliary: Liver contour is subtly lobular with question of a degree of underlying hepatic cirrhosis. No focal liver lesions are appreciable. There are apparent small gallstones within the gallbladder. The gallbladder wall does not appear appreciably thickened. There is no biliary duct dilatation. Pancreas: There is no evident pancreatic mass or inflammatory focus. Spleen: No splenic lesions are appreciable. Adrenals/Urinary Tract: Adrenals bilaterally appear normal. There is no evident renal mass or hydronephrosis on either side. There is a 2 mm calculus in the mid right kidney. There are several adjacent calculi in the lower pole the right kidney measuring between 1 and 4 mm in size. There is a 2 mm calculus in the  mid left kidney. There is no appreciable ureteral calculus on either side. Urinary bladder wall is diffusely thickened. Note that urinary bladder is nearly empty at this time. Stomach/Bowel: There are diverticula in the descending colon and sigmoid colon without wall thickening or changes suggesting diverticulitis. Elsewhere there is no appreciable bowel wall mesenteric thickening. No evident bowel obstruction. The terminal ileum appears normal. There is no appreciable free air or portal venous air. Vascular/Lymphatic: No abdominal aortic aneurysm. There is extensive aortic and iliac artery atherosclerosis. There is also extensive calcification in more distal pelvic arterial vessels. Major venous structures appear patent. There is no appreciable adenopathy in the abdomen or pelvis. Reproductive: Prostate has a somewhat inhomogeneous echotexture with prominence of the prostate. A well-defined prostatic mass is not seen by CT.  The seminal vesicles appear unremarkable. Other: The appendix appears normal. No evident abscess or ascites in the abdomen or pelvis. There is mild fat in the umbilicus. Musculoskeletal: There is degenerative change in the lumbar spine. No blastic or lytic bone lesions. No intramuscular lesions are evident. Review of the MIP images confirms the above findings. IMPRESSION: CT angiogram chest: 1. No demonstrable pulmonary embolus. No thoracic aortic aneurysm or dissection. There is aortic atherosclerosis as well as foci of great vessel and coronary artery calcification. Stents in the left innominate and axillary veins noted. 2. Widespread fibrosis with volume loss throughout the left lung. Varicoid bronchiectatic change throughout the left lung, stable. Right lung clear except for scattered areas of atelectatic change. 3. Prominence of the main pulmonary outflow tract, a finding indicative of pulmonary arterial hypertension. 4.  No adenopathy. CT abdomen and pelvis: 1. Liver contour is subtly  lobular. Question a degree of underlying hepatic cirrhosis. No focal liver lesions are appreciable. 2.  Small gallstones.  No gallbladder wall thickening evident. 3. Left colonic diverticula without diverticulitis. No bowel wall thickening or bowel obstruction. No abscess in the abdomen or pelvis. Appendix appears normal. 4. Nonobstructing calculi in each kidney. No hydronephrosis or ureteral calculus on either side. Generalized urinary bladder wall thickening, likely due to a degree of cystitis. 5. Somewhat inhomogeneous attenuation of the prostate. Significance of this finding uncertain. Clinical assessment of prostate and appropriate laboratory correlation advised in this regard. 6. Aortic Atherosclerosis (ICD10-I70.0). There is extensive pelvic arterial vascular calcification bilaterally. Electronically Signed   By: Lowella Grip III M.D.   On: 01/08/2020 10:14    ROS: All others negative except those listed in HPI.  Physical Exam: Vitals:   01/08/20 0847 01/08/20 0909 01/08/20 0916 01/08/20 1050  BP:   110/60   Pulse: 79 80 81 87  Resp: 19 18 17 19   Temp:      TempSrc:      SpO2: 100% 100% 100% 96%  Weight:      Height:         General: WDWN male in NAD Head: NCAT, +scleara injected, no scleral icterus, MMM Neck: Supple. No lymphadenopathy. No JVD Lungs: +scattered course breath sounds, Increased WOB with 2L O2 via [place], no accessory muscle use Heart: RRR. No murmur, rubs or gallops.  Abdomen: soft, +tenderness RLQ, +BS, no guarding, no rebound tenderness Lower extremities:no edema, ischemic changes, or open wounds  Neuro: AAOx3. Moves all extremities spontaneously. Psych:  Responds to questions appropriately Dialysis Access:LU AVF +b  Dialysis Orders:  TTS - [facility]  4hrs, BFR 400, DFR 800,  EDW 67.5kg, 2K/ 2Ca  Access: LU AVF  Heparin none Hectorol 1 mcg IV qHD    Assessment/Plan: 1.  Sepsis - etiology unclear, ?PNA, ?GI, febrile, WBC/lactic acid improving.  CXR with no acute  findings. CTA negative for PE, CT abd with no acute abnormalities.  Resp panel neg, BC ordered.  ABX started. Per primary 2. Hypoxia - on 2L O2.  Possibly 2/2 new infection.  Does not appear volume overloaded.  Left HD 1L over dry Monday.  Plan for UF to dry.   3. Diarrhea - CT abd/pelvis with no acute abnormalities.  GI panel pending. 4. Hepatic cirrhosis - New finding on [place] completed last week due to elevated labs.  Consulting GI.  5.  ESRD -  On HD MWF.  HD today per regular schedule.  K 3.7, use increased K bath. No Heparin. 6.  Hypotension/volume  - chronic hypotension  on midodrine. Does not appear volume overloaded.  Plan for UF to dry.   7.  Anemia of CKD - Hgb 9.5.  Will start ESA. 8.  Secondary Hyperparathyroidism -  CCa at goal. Will check phos.  Continue binders, VDRA.   9.  Nutrition - Renal diet w/fluid restrictions 10. 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 Per primary 11. DMT2 - per primary 12. GERD - Hx bleeding duodenal ulcer  Jen Mow, PA-C [facility] 01/08/2020, 12:05 PM   I have seen and examined this patient and agree with plan and assessment in the above note with renal recommendations/intervention highlighted.  He reports he started feeling sick 2 days ago.  Denies any cough, dysuria, pyuria, ulcerations, wounds but did have some diarrhea.  Workup underway.   Governor Rooks Chayne Baumgart,MD 01/08/2020 2:37 PM

## 2020-01-09 LAB — CBC WITH DIFFERENTIAL/PLATELET
Abs Immature Granulocytes: 0.08 10*3/uL — ABNORMAL HIGH (ref 0.00–0.07)
Basophils Absolute: 0 10*3/uL (ref 0.0–0.1)
Basophils Relative: 0 %
Eosinophils Absolute: 0.1 10*3/uL (ref 0.0–0.5)
Eosinophils Relative: 1 %
HCT: 32 % — ABNORMAL LOW (ref 39.0–52.0)
Hemoglobin: 10.4 g/dL — ABNORMAL LOW (ref 13.0–17.0)
Immature Granulocytes: 1 %
Lymphocytes Relative: 2 %
Lymphs Abs: 0.2 10*3/uL — ABNORMAL LOW (ref 0.7–4.0)
MCH: 34.3 pg — ABNORMAL HIGH (ref 26.0–34.0)
MCHC: 32.5 g/dL (ref 30.0–36.0)
MCV: 105.6 fL — ABNORMAL HIGH (ref 80.0–100.0)
Monocytes Absolute: 0.6 10*3/uL (ref 0.1–1.0)
Monocytes Relative: 6 %
Neutro Abs: 8.5 10*3/uL — ABNORMAL HIGH (ref 1.7–7.7)
Neutrophils Relative %: 90 %
Platelets: 145 10*3/uL — ABNORMAL LOW (ref 150–400)
RBC: 3.03 MIL/uL — ABNORMAL LOW (ref 4.22–5.81)
RDW: 14.5 % (ref 11.5–15.5)
WBC: 9.5 10*3/uL (ref 4.0–10.5)
nRBC: 0 % (ref 0.0–0.2)

## 2020-01-09 LAB — COMPREHENSIVE METABOLIC PANEL
ALT: 22 U/L (ref 0–44)
AST: 43 U/L — ABNORMAL HIGH (ref 15–41)
Albumin: 2.5 g/dL — ABNORMAL LOW (ref 3.5–5.0)
Alkaline Phosphatase: 211 U/L — ABNORMAL HIGH (ref 38–126)
Anion gap: 12 (ref 5–15)
BUN: 20 mg/dL (ref 8–23)
CO2: 26 mmol/L (ref 22–32)
Calcium: 8.3 mg/dL — ABNORMAL LOW (ref 8.9–10.3)
Chloride: 98 mmol/L (ref 98–111)
Creatinine, Ser: 4.72 mg/dL — ABNORMAL HIGH (ref 0.61–1.24)
GFR calc Af Amer: 13 mL/min — ABNORMAL LOW (ref >60)
GFR calc non Af Amer: 12 mL/min — ABNORMAL LOW (ref >60)
Glucose, Bld: 127 mg/dL — ABNORMAL HIGH (ref 70–99)
Potassium: 3.8 mmol/L (ref 3.5–5.1)
Sodium: 136 mmol/L (ref 135–145)
Total Bilirubin: 2.1 mg/dL — ABNORMAL HIGH (ref 0.3–1.2)
Total Protein: 5.7 g/dL — ABNORMAL LOW (ref 6.5–8.1)

## 2020-01-09 LAB — HEMOGLOBIN A1C
Hgb A1c MFr Bld: 5.8 % — ABNORMAL HIGH (ref 4.8–5.6)
Mean Plasma Glucose: 119.76 mg/dL

## 2020-01-09 LAB — PROCALCITONIN: Procalcitonin: 24.76 ng/mL

## 2020-01-09 LAB — MAGNESIUM: Magnesium: 1.9 mg/dL (ref 1.7–2.4)

## 2020-01-09 LAB — C DIFFICILE QUICK SCREEN W PCR REFLEX
C Diff antigen: POSITIVE — AB
C Diff interpretation: DETECTED
C Diff toxin: POSITIVE — AB

## 2020-01-09 LAB — MRSA PCR SCREENING: MRSA by PCR: NEGATIVE

## 2020-01-09 LAB — PHOSPHORUS: Phosphorus: 2.6 mg/dL (ref 2.5–4.6)

## 2020-01-09 LAB — GLUCOSE, CAPILLARY
Glucose-Capillary: 228 mg/dL — ABNORMAL HIGH (ref 70–99)
Glucose-Capillary: 137 mg/dL — ABNORMAL HIGH (ref 70–99)
Glucose-Capillary: 132 mg/dL — ABNORMAL HIGH (ref 70–99)

## 2020-01-09 MED ORDER — MIDODRINE HCL 5 MG PO TABS
10.0000 mg | ORAL_TABLET | Freq: Three times a day (TID) | ORAL | Status: DC
  Administered 2020-01-09 – 2020-01-13 (×10): 10 mg via ORAL
  Filled 2020-01-09 (×10): qty 2

## 2020-01-09 MED ORDER — CHLORHEXIDINE GLUCONATE CLOTH 2 % EX PADS: 6.0000 | MEDICATED_PAD | Freq: Every day | CUTANEOUS | Status: DC

## 2020-01-09 NOTE — Progress Notes (Addendum)
[facility] Progress Note   Subjective:   Patient seen and examined at bedside.  Reports he was feeling better this AM until he stood up to go to the bathroom.  He felt weak and disoriented with standing and sat back down.  Starting to feel a little better now that he is back in bed.  Continues to have diarrhea, frustrated because he has had incontinence due to not being able to get out of bed to go to the bathroom.  Advised to call nurse when he needs to go.  Denies chest pain, shortness of breath, orthopnea, nausea and vomiting.  Reports dialysis went well yesterday.   Objective Vitals:   01/09/20 0006 01/09/20 0527 01/09/20 0740 01/09/20 0827  BP: (!) 98/51 113/60 (!) 81/49 (!) 86/53  Pulse: 84 85 82 76  Resp: 20 20 18    Temp: 98.1 F (36.7 C) (!) 100.7 F (38.2 C) 98.8 F (37.1 C)   TempSrc: Oral Oral Oral   SpO2: 97% 100% 97%   Weight:      Height:       Physical Exam General:chronically ill appearing male in NAD Heart:RRR Lungs:CTAB Abdomen:soft, ND, +tenderness in lower quadrants Extremities:no LE edema Dialysis Access: LU AVF +b/t   Filed Weights   01/07/20 1926 01/08/20 1415 01/08/20 1828  Weight: 68 kg 71.4 kg 68 kg    Intake/Output Summary (Last 24 hours) at 01/09/2020 1030 Last data filed at 01/09/2020 0608 Gross per 24 hour  Intake 1480 ml  Output 3500 ml  Net -2020 ml    Additional Objective Labs: Basic Metabolic Panel: Recent Labs  Lab 01/07/20 2011 01/08/20 1059 01/09/20 0208  NA 137 134* 136  K 3.7 3.7 3.8  CL 93* 94* 98  CO2 28 27 26   GLUCOSE 197* 103* 127*  BUN 44* 52* 20  CREATININE 8.78* 9.50* 4.72*  CALCIUM 8.7* 8.1* 8.3*   Liver Function Tests: Recent Labs  Lab 01/07/20 2011 01/08/20 1059 01/09/20 0208  AST 47* 34 43*  ALT 25 22 22   ALKPHOS 235* 212* 211*  BILITOT 3.5* 2.7* 2.1*  PROT 6.0* 5.5* 5.7*  ALBUMIN 2.7* 2.4* 2.5*   CBC: Recent Labs  Lab 01/07/20 2011 01/08/20 1059 01/09/20 0208  WBC 15.2* 11.8*  9.5  NEUTROABS 14.7* 10.2* 8.5*  HGB 10.1* 9.5* 10.4*  HCT 31.5* 30.4* 32.0*  MCV 107.9* 108.2* 105.6*  PLT 156 125* 145*   CBG: Recent Labs  Lab 01/08/20 0810 01/08/20 1213  GLUCAP 92 98   Studies/Results: DG Chest 2 View  Result Date: 01/07/2020 CLINICAL DATA:  Tachycardia, cough and shortness of breath x1 day. EXAM: CHEST - 2 VIEW COMPARISON:  September 03, 2019 FINDINGS: Stable moderate to marked severity diffusely increased interstitial opacities are again seen throughout the left lung. This is unchanged in appearance when compared to the prior exam. Mild left-sided volume loss is again noted. Very mild, stable right basilar atelectasis is noted. There is no evidence of a pleural effusion or pneumothorax. The heart size and mediastinal contours are within normal limits. Radiopaque surgical clips are seen overlying the right hilum. Radiopaque vascular stents are again seen just above the aortic arch and below the mid left clavicle. The visualized skeletal structures are unremarkable. IMPRESSION: Stable moderate to marked severity diffuse interstitial fibrosis within the left lung. Electronically Signed   By: Virgina Norfolk M.D.   On: 01/07/2020 22:30   CT Angio Chest PE W and/or Wo Contrast  Result Date: 01/08/2020 CLINICAL DATA:  Shortness of breath; diarrhea EXAM: CT ANGIOGRAPHY CHEST CT ABDOMEN AND PELVIS WITH CONTRAST TECHNIQUE: Multidetector CT imaging of the chest was performed using the standard protocol during bolus administration of intravenous contrast. Multiplanar CT image reconstructions and MIPs were obtained to evaluate the vascular anatomy. Multidetector CT imaging of the abdomen and pelvis was performed using the standard protocol during bolus administration of intravenous contrast. CONTRAST:  141mL OMNIPAQUE IOHEXOL 350 MG/ML SOLN COMPARISON:  Chest radiograph January 07, 2020;. CT angiogram chest September 03, 2019; CT abdomen and pelvis September 02, 2015 FINDINGS: CTA CHEST FINDINGS  Cardiovascular: No demonstrable pulmonary embolus. No appreciable thoracic aortic aneurysm or dissection. There are scattered foci of calcification in visualized great vessels as well as in the aorta. Note that there is a stent in the left innominate vein. There is also a stent in the left axillary vein. There are multiple foci of coronary artery calcification. There is left ventricular hypertrophy. No pericardial thickening or effusion. The main pulmonary outflow tract measures 3.2 cm in diameter, prominent. Mediastinum/Nodes: Thyroid appears unremarkable. No appreciable thoracic adenopathy. No esophageal lesions are evident. Lungs/Pleura: There is fibrosis throughout most of the left lung, similar to prior study. There is atelectatic change in the right base. There is no new opacity to suggest edema or consolidation. No evident pleural effusion. Varicoid bronchiectatic change noted throughout the left lung. Note that there is left lung volume loss. Musculoskeletal: There are foci of degenerative change in the thoracic spine. No blastic or lytic bone lesions. No chest wall lesions appreciable. Review of the MIP images confirms the above findings. CT ABDOMEN and PELVIS FINDINGS Hepatobiliary: Liver contour is subtly lobular with question of a degree of underlying hepatic cirrhosis. No focal liver lesions are appreciable. There are apparent small gallstones within the gallbladder. The gallbladder wall does not appear appreciably thickened. There is no biliary duct dilatation. Pancreas: There is no evident pancreatic mass or inflammatory focus. Spleen: No splenic lesions are appreciable. Adrenals/Urinary Tract: Adrenals bilaterally appear normal. There is no evident renal mass or hydronephrosis on either side. There is a 2 mm calculus in the mid right kidney. There are several adjacent calculi in the lower pole the right kidney measuring between 1 and 4 mm in size. There is a 2 mm calculus in the mid left kidney.  There is no appreciable ureteral calculus on either side. Urinary bladder wall is diffusely thickened. Note that urinary bladder is nearly empty at this time. Stomach/Bowel: There are diverticula in the descending colon and sigmoid colon without wall thickening or changes suggesting diverticulitis. Elsewhere there is no appreciable bowel wall mesenteric thickening. No evident bowel obstruction. The terminal ileum appears normal. There is no appreciable free air or portal venous air. Vascular/Lymphatic: No abdominal aortic aneurysm. There is extensive aortic and iliac artery atherosclerosis. There is also extensive calcification in more distal pelvic arterial vessels. Major venous structures appear patent. There is no appreciable adenopathy in the abdomen or pelvis. Reproductive: Prostate has a somewhat inhomogeneous echotexture with prominence of the prostate. A well-defined prostatic mass is not seen by CT. The seminal vesicles appear unremarkable. Other: The appendix appears normal. No evident abscess or ascites in the abdomen or pelvis. There is mild fat in the umbilicus. Musculoskeletal: There is degenerative change in the lumbar spine. No blastic or lytic bone lesions. No intramuscular lesions are evident. Review of the MIP images confirms the above findings. IMPRESSION: CT angiogram chest: 1. No demonstrable pulmonary embolus. No thoracic aortic aneurysm or dissection. There  is aortic atherosclerosis as well as foci of great vessel and coronary artery calcification. Stents in the left innominate and axillary veins noted. 2. Widespread fibrosis with volume loss throughout the left lung. Varicoid bronchiectatic change throughout the left lung, stable. Right lung clear except for scattered areas of atelectatic change. 3. Prominence of the main pulmonary outflow tract, a finding indicative of pulmonary arterial hypertension. 4.  No adenopathy. CT abdomen and pelvis: 1. Liver contour is subtly lobular. Question a  degree of underlying hepatic cirrhosis. No focal liver lesions are appreciable. 2.  Small gallstones.  No gallbladder wall thickening evident. 3. Left colonic diverticula without diverticulitis. No bowel wall thickening or bowel obstruction. No abscess in the abdomen or pelvis. Appendix appears normal. 4. Nonobstructing calculi in each kidney. No hydronephrosis or ureteral calculus on either side. Generalized urinary bladder wall thickening, likely due to a degree of cystitis. 5. Somewhat inhomogeneous attenuation of the prostate. Significance of this finding uncertain. Clinical assessment of prostate and appropriate laboratory correlation advised in this regard. 6. Aortic Atherosclerosis (ICD10-I70.0). There is extensive pelvic arterial vascular calcification bilaterally. Electronically Signed   By: Lowella Grip III M.D.   On: 01/08/2020 10:14   CT ABDOMEN PELVIS W CONTRAST  Result Date: 01/08/2020 CLINICAL DATA:  Shortness of breath; diarrhea EXAM: CT ANGIOGRAPHY CHEST CT ABDOMEN AND PELVIS WITH CONTRAST TECHNIQUE: Multidetector CT imaging of the chest was performed using the standard protocol during bolus administration of intravenous contrast. Multiplanar CT image reconstructions and MIPs were obtained to evaluate the vascular anatomy. Multidetector CT imaging of the abdomen and pelvis was performed using the standard protocol during bolus administration of intravenous contrast. CONTRAST:  162mL OMNIPAQUE IOHEXOL 350 MG/ML SOLN COMPARISON:  Chest radiograph January 07, 2020;. CT angiogram chest September 03, 2019; CT abdomen and pelvis September 02, 2015 FINDINGS: CTA CHEST FINDINGS Cardiovascular: No demonstrable pulmonary embolus. No appreciable thoracic aortic aneurysm or dissection. There are scattered foci of calcification in visualized great vessels as well as in the aorta. Note that there is a stent in the left innominate vein. There is also a stent in the left axillary vein. There are multiple foci of  coronary artery calcification. There is left ventricular hypertrophy. No pericardial thickening or effusion. The main pulmonary outflow tract measures 3.2 cm in diameter, prominent. Mediastinum/Nodes: Thyroid appears unremarkable. No appreciable thoracic adenopathy. No esophageal lesions are evident. Lungs/Pleura: There is fibrosis throughout most of the left lung, similar to prior study. There is atelectatic change in the right base. There is no new opacity to suggest edema or consolidation. No evident pleural effusion. Varicoid bronchiectatic change noted throughout the left lung. Note that there is left lung volume loss. Musculoskeletal: There are foci of degenerative change in the thoracic spine. No blastic or lytic bone lesions. No chest wall lesions appreciable. Review of the MIP images confirms the above findings. CT ABDOMEN and PELVIS FINDINGS Hepatobiliary: Liver contour is subtly lobular with question of a degree of underlying hepatic cirrhosis. No focal liver lesions are appreciable. There are apparent small gallstones within the gallbladder. The gallbladder wall does not appear appreciably thickened. There is no biliary duct dilatation. Pancreas: There is no evident pancreatic mass or inflammatory focus. Spleen: No splenic lesions are appreciable. Adrenals/Urinary Tract: Adrenals bilaterally appear normal. There is no evident renal mass or hydronephrosis on either side. There is a 2 mm calculus in the mid right kidney. There are several adjacent calculi in the lower pole the right kidney measuring between 1 and 4  mm in size. There is a 2 mm calculus in the mid left kidney. There is no appreciable ureteral calculus on either side. Urinary bladder wall is diffusely thickened. Note that urinary bladder is nearly empty at this time. Stomach/Bowel: There are diverticula in the descending colon and sigmoid colon without wall thickening or changes suggesting diverticulitis. Elsewhere there is no appreciable  bowel wall mesenteric thickening. No evident bowel obstruction. The terminal ileum appears normal. There is no appreciable free air or portal venous air. Vascular/Lymphatic: No abdominal aortic aneurysm. There is extensive aortic and iliac artery atherosclerosis. There is also extensive calcification in more distal pelvic arterial vessels. Major venous structures appear patent. There is no appreciable adenopathy in the abdomen or pelvis. Reproductive: Prostate has a somewhat inhomogeneous echotexture with prominence of the prostate. A well-defined prostatic mass is not seen by CT. The seminal vesicles appear unremarkable. Other: The appendix appears normal. No evident abscess or ascites in the abdomen or pelvis. There is mild fat in the umbilicus. Musculoskeletal: There is degenerative change in the lumbar spine. No blastic or lytic bone lesions. No intramuscular lesions are evident. Review of the MIP images confirms the above findings. IMPRESSION: CT angiogram chest: 1. No demonstrable pulmonary embolus. No thoracic aortic aneurysm or dissection. There is aortic atherosclerosis as well as foci of great vessel and coronary artery calcification. Stents in the left innominate and axillary veins noted. 2. Widespread fibrosis with volume loss throughout the left lung. Varicoid bronchiectatic change throughout the left lung, stable. Right lung clear except for scattered areas of atelectatic change. 3. Prominence of the main pulmonary outflow tract, a finding indicative of pulmonary arterial hypertension. 4.  No adenopathy. CT abdomen and pelvis: 1. Liver contour is subtly lobular. Question a degree of underlying hepatic cirrhosis. No focal liver lesions are appreciable. 2.  Small gallstones.  No gallbladder wall thickening evident. 3. Left colonic diverticula without diverticulitis. No bowel wall thickening or bowel obstruction. No abscess in the abdomen or pelvis. Appendix appears normal. 4. Nonobstructing calculi in  each kidney. No hydronephrosis or ureteral calculus on either side. Generalized urinary bladder wall thickening, likely due to a degree of cystitis. 5. Somewhat inhomogeneous attenuation of the prostate. Significance of this finding uncertain. Clinical assessment of prostate and appropriate laboratory correlation advised in this regard. 6. Aortic Atherosclerosis (ICD10-I70.0). There is extensive pelvic arterial vascular calcification bilaterally. Electronically Signed   By: Lowella Grip III M.D.   On: 01/08/2020 10:14    Medications: . ceFEPime (MAXIPIME) IV 1 g (01/09/20 0126)  . metronidazole 500 mg (01/09/20 7322)  . vancomycin 750 mg (01/08/20 1712)   . aspirin  81 mg Oral Daily  . calcium acetate  1,334 mg Oral TID AC  . Chlorhexidine Gluconate Cloth  6 each Topical Q0600  . dorzolamide-timolol  1 drop Both Eyes BID  . fluticasone  2 spray Each Nare Daily  . heparin  5,000 Units Subcutaneous Q8H  . insulin aspart  0-9 Units Subcutaneous TID AC & HS  . insulin aspart  5 Units Subcutaneous QAC breakfast  . insulin aspart  6 Units Subcutaneous QAC lunch  . insulin aspart  6 Units Subcutaneous QAC supper  . midodrine  10 mg Oral TID WC  . multivitamin with minerals  1 tablet Oral Q T,Th,S,Su  . pantoprazole  40 mg Oral BID  . prednisoLONE acetate  1 drop Both Eyes BID  . predniSONE  5 mg Oral Q breakfast  . pyridOXINE  100 mg Oral Daily  .  sulfamethoxazole-trimethoprim  1 tablet Oral Q M,W,F-2000  . [START ON 01/10/2020] valGANciclovir  450 mg Oral Once per day on Mon Fri    Dialysis Orders: MWF - [facility]  4hrs, BFR 400, DFR 800,  EDW 67.5kg, 2K/ 2Ca  Access: LU AVF  Heparin none Hectorol 1 mcg IV qHD    Assessment/Plan: 1.  Sepsis - etiology unclear, ?PNA, ?GI, febrile, WBC/lactic acid improved.  CXR with no acute findings. CTA negative for PE, CT abd with no acute abnormalities.  GI and Resp panel neg, BC with NGTD.  ABX started. Per primary 2. Hypoxia - Improved, on RA.  Possibly 2/2 new infection vs fluid.  Now close to dry weight.   3. Diarrhea - CT abd/pelvis with no acute abnormalities.  GI panel negative. 4. Hepatic cirrhosis - New finding on [place] completed last week due to elevated labs.  Consulting GI.  5.  ESRD -  On HD MWF.  HD yesterday tolerated well.  K 3.8, use increased K bath. No Heparin.  Next HD 8/6. 6.  Hypotension/volume  - chronic hypotension on midodrine. UF 3.5L removed yesterday.  Close to dry, does not appear volume overloaded.  7.  Anemia of CKD - Hgb 10.4.  Will hold off on starting ESA for now.  8.  Secondary Hyperparathyroidism -  CCa at goal. Will check phos.  Continue binders, VDRA.   9.  Nutrition - Renal diet w/fluid restrictions 10. 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 Per primary 11. DMT2 - per primary 12. GERD - Hx bleeding duodenal ulcer    Jen Mow, PA-C [facility] 01/09/2020,10:30 AM  LOS: 1 day   I have seen and examined this patient and agree with plan and assessment in the above note with renal recommendations/intervention highlighted.  Continue with HD on MWF schedule while he remains an inpatient. [place] John A Emira Eubanks,MD 01/09/2020 11:03 AM

## 2020-01-09 NOTE — Progress Notes (Signed)
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 Work up thus far unrevealing. Sars Covid 19 and viral resp panel negative.  No evidence of diverticulitis on CT scan.  CTA chest negative for PE.  No clear evidence of pulmonary infiltrates.  Started on IV abx empirically in the ED. Blood cx negative to date.  01/09/20: Feels better but still weak.  States was having diarrhea prior to admission and had watery stools this AM. GI panel negative.  Will obtain c-diff pcr.  Assessment/Plan: Principal Problem:   Sepsis with acute organ dysfunction (HCC) Active Problems:   GERD without esophagitis   Essential hypertension   ILD (interstitial lung disease) ([place])   Acute respiratory failure with hypoxia (HCC)   End-stage renal disease on hemodialysis (HCC)   Lactic acidosis   Mixed diabetic hyperlipidemia associated with type 2 diabetes mellitus ([place])   Hepatic cirrhosis ([place])   Uncontrolled type 2 diabetes mellitus with hyperglycemia, with long-term current use of insulin (HCC)   IV infiltrate, initial encounter   Acute diarrhea   Acute metabolic encephalopathy  SIRS, no clear source of active infective process. Ongoing work-up.  Patient presenting with multiple sirs criteria including fever with T-max 101.7, substantial leukocytosis WBC 15 K, tachycardia heart rate 101.    Patient is immunocompromised due to known history of lung transplant on  immunosuppressive therapy including azathioprine, cyclosporine and prednisone.   Chest x-ray reveals no obvious evidence of new infiltrate however there may be a brewing pneumonia in the patient's left lung which is obscured by chronic infiltrates on chest x-ray.  Additionally, patient is complaining of a 1 day history of watery diarrhea so a concurrent infectious colitis is also possible.  Case was discussed with covering transplant physician at [facility] by the emergency department provider.  This was Dr. Robley Fries covering for Dr. Wynetta Emery.  They recommended obtaining CT angiogram of the chest as well as CT imaging of the abdomen to identify for any evidence of pulmonary emboli or sources of infection.  It is also recommended that the patient undergo a respiratory viral panel including testing for RSV  COVID-19 PCR testing negative, acute respiratory panel negative, GI panel negative.  Patient is receiving broad-spectrum intravenous antibiotic therapy with cefepime, metronidazole and IV vancomycin  Providing patient with supplemental oxygen for associated hypoxia  Blood cultures have been obtained.  Continue to follow cultures.  Negative to date.  Active Problems:   Acute respiratory failure with hypoxia (HCC) Possibly related to atelectasis  Patient presenting with new onset hypoxia.  Patient is not on oxygen supplementation at home.  Start incentive spirometer.  Maintain O2 saturation greater than 90%  Continue to monitor vital signs    Acute diarrhea   Patient complains of a 1 day history of watery diarrhea.  Additionally, patient has lower abdominal tenderness on examination.  This is possibly a source of infection of this patient, patient undergo CT imaging of the abdomen and pelvis.  Stool studies have also been ordered including stool GI PCR panel which was negative.   Obtain C. difficile  testing.      Resolved acute metabolic encephalopathy Currently back to his  baseline mentation, alert and oriented x4.   Lethargic on presentation.    Conversational, but still weak appearing, states he feels better this morning    End-stage renal disease on hemodialysis MWF(HCC) Nephrology following Was hemodialyzed on April 21.  History of hypotension On midodrine prior to admission Hypotensive this morning Increased dose of midodrine 10 mg 3 times daily. Closely monitor vital signs.    Hepatic cirrhosis ([place])   New finding on right upper quadrant ultrasound performed last week after being ordered by patient's nephrologist in response to findings of hyperbilirubinemia and rising transaminases.  Patient continues to exhibit elevated alkaline phosphatase, AST, ALT and rising hyperbilirubinemia.  This is possibly secondary to hepatocellular injury from patient's azathioprine or possibly patient's cyclosporine.  We will temporarily hold these drugs and likely formally consult gastroenterology on day team for further input.  If [facility] is abnormal LFTs to patient's immunosuppressive therapy then this will need to be discussed with patient's transplant provider Dr. Wynetta [facility] at [facility].  Patient denies alcohol use    Uncontrolled type 2 diabetes mellitus with hyperglycemia, likely contributed by chronic steroid use in the setting of R lung transplant. Hemoglobin A1c 5.8 on 01/08/20 Continue insulin sliding scale    IV infiltrate, initial encounter   Patient unfortunately experienced a right upper extremity small volume infiltration of contrast upon first attempt of performing contrast CTs in the emergency department.  Patient was immediately evaluated by Dr. Gerilyn Nestle with radiology who placed extravasation protocol orders and recommended conservative management.    GERD without esophagitis   Continue home regimen of PPI  Of note, patient has known history of gastrointestinal bleeding secondary to bleeding duodenal  ulcer.    Code Status:  Full code Family Communication:  Will call his wife for update with his permission.    Consultants:  Nephrology  Interventional radiology  Procedures:  Contrast extravasation  Antimicrobials:  IV vancomycin  Cefepime  IV Flagyl  DVT prophylaxis: Subcu heparin 3 times daily  Status is: Inpatient    Dispo:  Patient From: Home  Planned Disposition: Home with Health Care Svc  Expected discharge date: 01/12/20  Medically stable for discharge: No, ongoing work-up for persistent symptomatology.         Objective: Vitals:   01/09/20 0006 01/09/20 0527 01/09/20 0740 01/09/20 0827  BP: (!) 98/51 113/60 (!) 81/49 (!) 86/53  Pulse: 84 85 82 76  Resp: 20 20 18    Temp: 98.1 F (36.7 C) (!) 100.7 F (38.2 C) 98.8 F (37.1 C)   TempSrc: Oral Oral Oral   SpO2: 97% 100% 97%   Weight:      Height:        Intake/Output Summary (Last 24 hours) at 01/09/2020 1221 Last data filed at 01/09/2020 3846 Gross per 24 hour  Intake 1480 ml  Output 3500 ml  Net -2020 ml   Filed Weights   01/07/20 1926 01/08/20 1415 01/08/20 1828  Weight: 68 kg 71.4 kg 68 kg    Exam:  . General: 69 y.o. year-old male chronically ill-appearing in no acute distress.  Alert and oriented x3. . Cardiovascular: Regular rate and rhythm with no rubs or gallops.  No thyromegaly or JVD noted.   Marland Kitchen Respiratory: Clear to auscultation with no wheezes or rales. Good inspiratory effort. . Abdomen: Soft nontender nondistended with normal bowel sounds x4 quadrants. . Musculoskeletal: Trace lower extremity edema bilaterally. Marland Kitchen Psychiatry: Mood  is appropriate for condition and setting   Data Reviewed: CBC: Recent Labs  Lab 01/07/20 2011 01/08/20 1059 01/09/20 0208  WBC 15.2* 11.8* 9.5  NEUTROABS 14.7* 10.2* 8.5*  HGB 10.1* 9.5* 10.4*  HCT 31.5* 30.4* 32.0*  MCV 107.9* 108.2* 105.6*  PLT 156 125* 323*   Basic Metabolic Panel: Recent Labs  Lab 01/07/20 2011  01/08/20 1059 01/09/20 0208 01/09/20 1049  NA 137 134* 136  --   K 3.7 3.7 3.8  --   CL 93* 94* 98  --   CO2 28 27 26   --   GLUCOSE 197* 103* 127*  --   BUN 44* 52* 20  --   CREATININE 8.78* 9.50* 4.72*  --   CALCIUM 8.7* 8.1* 8.3*  --   MG  --  2.1 1.9  --   PHOS  --   --   --  2.6   GFR: Estimated Creatinine Clearance: 11.7 mL/min (A) (by C-G formula based on SCr of 4.72 mg/dL (H)). Liver Function Tests: Recent Labs  Lab 01/07/20 2011 01/08/20 1059 01/09/20 0208  AST 47* 34 43*  ALT 25 22 22   ALKPHOS 235* 212* 211*  BILITOT 3.5* 2.7* 2.1*  PROT 6.0* 5.5* 5.7*  ALBUMIN 2.7* 2.4* 2.5*   No results for input(s): LIPASE, AMYLASE in the last 168 hours. No results for input(s): AMMONIA in the last 168 hours. Coagulation Profile: Recent Labs  Lab 01/07/20 2011  INR 1.1   Cardiac Enzymes: No results for input(s): CKTOTAL, CKMB, CKMBINDEX, TROPONINI in the last 168 hours. BNP (last 3 results) No results for input(s): PROBNP in the last 8760 hours. HbA1C: Recent Labs    01/08/20 1059  HGBA1C 5.8*   CBG: Recent Labs  Lab 01/08/20 0810 01/08/20 1213  GLUCAP 92 98   Lipid Profile: No results for input(s): CHOL, HDL, LDLCALC, TRIG, CHOLHDL, LDLDIRECT in the last 72 hours. Thyroid Function Tests: No results for input(s): TSH, T4TOTAL, FREET4, T3FREE, THYROIDAB in the last 72 hours. Anemia Panel: No results for input(s): VITAMINB12, FOLATE, FERRITIN, TIBC, IRON, RETICCTPCT in the last 72 hours. Urine analysis:    Component Value Date/Time   COLORURINE AMBER (A) 12/31/2015 0515   APPEARANCEUR CLOUDY (A) 12/31/2015 0515   LABSPEC 1.016 12/31/2015 0515   PHURINE 7.0 12/31/2015 0515   GLUCOSEU NEGATIVE 12/31/2015 0515   HGBUR NEGATIVE 12/31/2015 0515   BILIRUBINUR SMALL (A) 12/31/2015 0515   KETONESUR 15 (A) 12/31/2015 0515   PROTEINUR 100 (A) 12/31/2015 0515   UROBILINOGEN 2.0 (H) 08/15/2013 1916   NITRITE NEGATIVE 12/31/2015 0515   LEUKOCYTESUR NEGATIVE  12/31/2015 0515   Sepsis Labs: @LABRCNTIP (procalcitonin:4,lacticidven:4)  ) Recent Results (from the past 240 hour(s))  Culture, blood (Routine x 2)     Status: None (Preliminary result)   Collection Time: 01/07/20  7:15 PM   Specimen: BLOOD RIGHT ARM  Result Value Ref Range Status   Specimen Description BLOOD RIGHT ARM  Final   Special Requests   Final    BOTTLES DRAWN AEROBIC AND ANAEROBIC Blood Culture results may not be optimal due to an excessive volume of blood received in culture bottles   Culture   Final    NO GROWTH < 12 HOURS Performed at [facility], [place] [address] [place], [place] [address]    Report Status PENDING  Incomplete  Culture, blood (Routine x 2)     Status: None (Preliminary result)   Collection Time: 01/07/20  7:30 PM   Specimen: BLOOD RIGHT  HAND  Result Value Ref Range Status   Specimen Description BLOOD RIGHT HAND  Final   Special Requests   Final    BOTTLES DRAWN AEROBIC ONLY Blood Culture adequate volume   Culture   Final    NO GROWTH < 12 HOURS Performed at [facility], [address] [place], [place] [address]    Report Status PENDING  Incomplete  SARS Coronavirus 2 by RT PCR (hospital order, performed in [facility] hospital lab) Nasopharyngeal Nasopharyngeal Swab     Status: None   Collection Time: 01/07/20  7:43 PM   Specimen: Nasopharyngeal Swab  Result Value Ref Range Status   SARS Coronavirus 2 NEGATIVE NEGATIVE Final    Comment: (NOTE) SARS-CoV-2 target nucleic acids are NOT DETECTED.  The SARS-CoV-2 RNA is generally detectable in upper and lower respiratory specimens during the acute phase of infection. The lowest concentration of SARS-CoV-2 viral copies this assay can detect is 250 copies / mL. A negative result does not preclude SARS-CoV-2 infection and should not be used as the sole basis for treatment or other patient management decisions.  A negative result may occur with improper specimen collection / handling,  submission of specimen other than nasopharyngeal swab, presence of viral mutation(s) within the areas targeted by this assay, and inadequate number of viral copies (<250 copies / mL). A negative result must be combined with clinical observations, patient history, and epidemiological information.  Fact Sheet for Patients:   StrictlyIdeas.no  Fact Sheet for Healthcare Providers: BankingDealers.co.za  This test is not yet approved or  cleared by the [place] FDA and has been authorized for detection and/or diagnosis of SARS-CoV-2 by FDA under an Emergency Use Authorization (EUA).  This EUA will remain in effect (meaning this test can be used) for the duration of the COVID-19 declaration under Section 564(b)(1) of the Act, 21 U.S.C. section 360bbb-3(b)(1), unless the authorization is terminated or revoked sooner.  Performed at [facility], [place] [address] [place], [place] [address]   Resp Panel by RT PCR (RSV, Flu A&B, Covid) - Nasopharyngeal Swab     Status: None   Collection Time: 01/08/20  7:16 AM   Specimen: Nasopharyngeal Swab  Result Value Ref Range Status   SARS Coronavirus 2 by RT PCR NEGATIVE NEGATIVE Final    Comment: (NOTE) SARS-CoV-2 target nucleic acids are NOT DETECTED.  The SARS-CoV-2 RNA is generally detectable in upper respiratoy specimens during the acute phase of infection. The lowest concentration of SARS-CoV-2 viral copies this assay can detect is 131 copies/mL. A negative result does not preclude SARS-Cov-2 infection and should not be used as the sole basis for treatment or other patient management decisions. A negative result may occur with  improper specimen collection/handling, submission of specimen other than nasopharyngeal swab, presence of viral mutation(s) within the areas targeted by this assay, and inadequate number of viral copies (<131 copies/mL). A negative result must be combined with  clinical observations, patient history, and epidemiological information. The expected result is Negative.  Fact Sheet for Patients:  PinkCheek.be  Fact Sheet for Healthcare Providers:  GravelBags.it  This test is no t yet approved or cleared by the [place] FDA and  has been authorized for detection and/or diagnosis of SARS-CoV-2 by FDA under an Emergency Use Authorization (EUA). This EUA will remain  in effect (meaning this test can be used) for the duration of the COVID-19 declaration under Section 564(b)(1) of the Act, 21 U.S.C. section 360bbb-3(b)(1), unless the  authorization is terminated or revoked sooner.     Influenza A by PCR NEGATIVE NEGATIVE Final   Influenza B by PCR NEGATIVE NEGATIVE Final    Comment: (NOTE) The Xpert Xpress SARS-CoV-2/FLU/RSV assay is intended as an aid in  the diagnosis of influenza from Nasopharyngeal swab specimens and  should not be used as a sole basis for treatment. Nasal washings and  aspirates are unacceptable for Xpert Xpress SARS-CoV-2/FLU/RSV  testing.  Fact Sheet for Patients: PinkCheek.be  Fact Sheet for Healthcare Providers: GravelBags.it  This test is not yet approved or cleared by the [place] FDA and  has been authorized for detection and/or diagnosis of SARS-CoV-2 by  FDA under an Emergency Use Authorization (EUA). This EUA will remain  in effect (meaning this test can be used) for the duration of the  Covid-19 declaration under Section 564(b)(1) of the Act, 21  U.S.C. section 360bbb-3(b)(1), unless the authorization is  terminated or revoked.    Respiratory Syncytial Virus by PCR NEGATIVE NEGATIVE Final    Comment: (NOTE) Fact Sheet for Patients: PinkCheek.be  Fact Sheet for Healthcare Providers: GravelBags.it  This test is not yet approved  or cleared by the [place] FDA and  has been authorized for detection and/or diagnosis of SARS-CoV-2 by  FDA under an Emergency Use Authorization (EUA). This EUA will remain  in effect (meaning this test can be used) for the duration of the  COVID-19 declaration under Section 564(b)(1) of the Act, 21 U.S.C.  section 360bbb-3(b)(1), unless the authorization is terminated or  revoked. Performed at [facility], [place] [address], [place], [place] [address]   Gastrointestinal Panel by PCR , Stool     Status: None   Collection Time: 01/08/20  7:16 AM   Specimen: Stool  Result Value Ref Range Status   Campylobacter species NOT DETECTED NOT DETECTED Final   Plesimonas shigelloides NOT DETECTED NOT DETECTED Final   Salmonella species NOT DETECTED NOT DETECTED Final   Yersinia enterocolitica NOT DETECTED NOT DETECTED Final   Vibrio species NOT DETECTED NOT DETECTED Final   Vibrio cholerae NOT DETECTED NOT DETECTED Final   Enteroaggregative E coli (EAEC) NOT DETECTED NOT DETECTED Final   Enteropathogenic E coli (EPEC) NOT DETECTED NOT DETECTED Final   Enterotoxigenic E coli (ETEC) NOT DETECTED NOT DETECTED Final   Shiga like toxin producing E coli (STEC) NOT DETECTED NOT DETECTED Final   Shigella/Enteroinvasive E coli (EIEC) NOT DETECTED NOT DETECTED Final   Cryptosporidium NOT DETECTED NOT DETECTED Final   Cyclospora cayetanensis NOT DETECTED NOT DETECTED Final   Entamoeba histolytica NOT DETECTED NOT DETECTED Final   Giardia lamblia NOT DETECTED NOT DETECTED Final   Adenovirus F40/41 NOT DETECTED NOT DETECTED Final   Astrovirus NOT DETECTED NOT DETECTED Final   Norovirus GI/GII NOT DETECTED NOT DETECTED Final   Rotavirus A NOT DETECTED NOT DETECTED Final   Sapovirus (I, II, IV, and V) NOT DETECTED NOT DETECTED Final    Comment: Performed at [facility], [address] [place],  [address]  MRSA PCR Screening     Status: None   Collection Time: 01/09/20  8:20 AM    Specimen: Nasal Mucosa; Nasopharyngeal  Result Value Ref Range Status   MRSA by PCR NEGATIVE NEGATIVE Final    Comment:        The GeneXpert MRSA Assay (FDA approved for NASAL specimens only), is one component of a comprehensive MRSA colonization surveillance program. It is not intended to diagnose MRSA infection  nor to guide or monitor treatment for MRSA infections. Performed at [facility], [place] [address], [place], [place] [address]       Studies: No results found.  Scheduled Meds: . aspirin  81 mg Oral Daily  . calcium acetate  1,334 mg Oral TID AC  . Chlorhexidine Gluconate Cloth  6 each Topical Q0600  . dorzolamide-timolol  1 drop Both Eyes BID  . fluticasone  2 spray Each Nare Daily  . heparin  5,000 Units Subcutaneous Q8H  . insulin aspart  0-9 Units Subcutaneous TID AC & HS  . insulin aspart  5 Units Subcutaneous QAC breakfast  . insulin aspart  6 Units Subcutaneous QAC lunch  . insulin aspart  6 Units Subcutaneous QAC supper  . midodrine  10 mg Oral TID WC  . multivitamin with minerals  1 tablet Oral Q T,Th,S,Su  . pantoprazole  40 mg Oral BID  . prednisoLONE acetate  1 drop Both Eyes BID  . predniSONE  5 mg Oral Q breakfast  . pyridOXINE  100 mg Oral Daily  . sulfamethoxazole-trimethoprim  1 tablet Oral Q M,W,F-2000  . [START ON 01/10/2020] valGANciclovir  450 mg Oral Once per day on Mon Fri    Continuous Infusions: . ceFEPime (MAXIPIME) IV 1 g (01/09/20 0126)  . metronidazole 500 mg (01/09/20 3159)  . vancomycin 750 mg (01/08/20 1712)     LOS: 1 day     Kayleen Memos, MD [facility] Pager [phone]  If 7PM-7AM, please contact night-coverage [email or web address] Password [email or web address] 01/09/2020, 12:21 PM

## 2020-01-10 ENCOUNTER — Other Ambulatory Visit: Payer: Self-pay

## 2020-01-10 LAB — CBC WITH DIFFERENTIAL/PLATELET
Abs Immature Granulocytes: 0.12 10*3/uL — ABNORMAL HIGH (ref 0.00–0.07)
Basophils Absolute: 0 10*3/uL (ref 0.0–0.1)
Basophils Relative: 1 %
Eosinophils Absolute: 0 10*3/uL (ref 0.0–0.5)
Eosinophils Relative: 1 %
HCT: 30.5 % — ABNORMAL LOW (ref 39.0–52.0)
Hemoglobin: 9.6 g/dL — ABNORMAL LOW (ref 13.0–17.0)
Immature Granulocytes: 2 %
Lymphocytes Relative: 1 %
Lymphs Abs: 0.1 10*3/uL — ABNORMAL LOW (ref 0.7–4.0)
MCH: 33.4 pg (ref 26.0–34.0)
MCHC: 31.5 g/dL (ref 30.0–36.0)
MCV: 106.3 fL — ABNORMAL HIGH (ref 80.0–100.0)
Monocytes Absolute: 1.7 10*3/uL — ABNORMAL HIGH (ref 0.1–1.0)
Monocytes Relative: 22 %
Neutro Abs: 5.6 10*3/uL (ref 1.7–7.7)
Neutrophils Relative %: 73 %
Platelets: 133 10*3/uL — ABNORMAL LOW (ref 150–400)
RBC: 2.87 MIL/uL — ABNORMAL LOW (ref 4.22–5.81)
RDW: 14.8 % (ref 11.5–15.5)
WBC: 7.6 10*3/uL (ref 4.0–10.5)
nRBC: 0 % (ref 0.0–0.2)

## 2020-01-10 LAB — COMPREHENSIVE METABOLIC PANEL
ALT: 23 U/L (ref 0–44)
AST: 37 U/L (ref 15–41)
Albumin: 2.5 g/dL — ABNORMAL LOW (ref 3.5–5.0)
Alkaline Phosphatase: 232 U/L — ABNORMAL HIGH (ref 38–126)
Anion gap: 13 (ref 5–15)
BUN: 14 mg/dL (ref 8–23)
CO2: 25 mmol/L (ref 22–32)
Calcium: 8.2 mg/dL — ABNORMAL LOW (ref 8.9–10.3)
Chloride: 97 mmol/L — ABNORMAL LOW (ref 98–111)
Creatinine, Ser: 3.43 mg/dL — ABNORMAL HIGH (ref 0.61–1.24)
GFR calc Af Amer: 20 mL/min — ABNORMAL LOW (ref >60)
GFR calc non Af Amer: 17 mL/min — ABNORMAL LOW (ref >60)
Glucose, Bld: 112 mg/dL — ABNORMAL HIGH (ref 70–99)
Potassium: 3.8 mmol/L (ref 3.5–5.1)
Sodium: 135 mmol/L (ref 135–145)
Total Bilirubin: 1.5 mg/dL — ABNORMAL HIGH (ref 0.3–1.2)
Total Protein: 5.9 g/dL — ABNORMAL LOW (ref 6.5–8.1)

## 2020-01-10 LAB — GLUCOSE, CAPILLARY
Glucose-Capillary: 100 mg/dL — ABNORMAL HIGH (ref 70–99)
Glucose-Capillary: 110 mg/dL — ABNORMAL HIGH (ref 70–99)
Glucose-Capillary: 102 mg/dL — ABNORMAL HIGH (ref 70–99)
Glucose-Capillary: 143 mg/dL — ABNORMAL HIGH (ref 70–99)

## 2020-01-10 LAB — PHOSPHORUS: Phosphorus: 1.4 mg/dL — ABNORMAL LOW (ref 2.5–4.6)

## 2020-01-10 LAB — PROCALCITONIN: Procalcitonin: 21.52 ng/mL

## 2020-01-10 MED ORDER — SODIUM CHLORIDE 0.9 % IV SOLN: 100.0000 mL | INTRAVENOUS | Status: DC | PRN

## 2020-01-10 MED ORDER — TRAMADOL HCL 50 MG PO TABS
ORAL_TABLET | ORAL | Status: AC
  Administered 2020-01-10: 25 mg via ORAL
  Filled 2020-01-10: qty 1

## 2020-01-10 MED ORDER — CYCLOSPORINE 100 MG PO CAPS
100.0000 mg | ORAL_CAPSULE | Freq: Every day | ORAL | Status: DC
  Administered 2020-01-10 – 2020-01-13 (×4): 100 mg via ORAL
  Filled 2020-01-10 (×5): qty 1

## 2020-01-10 MED ORDER — LIDOCAINE-PRILOCAINE 2.5-2.5 % EX CREA: 1.0000 "application " | TOPICAL_CREAM | CUTANEOUS | Status: DC | PRN

## 2020-01-10 MED ORDER — LIDOCAINE HCL (PF) 1 % IJ SOLN: 5.0000 mL | INTRAMUSCULAR | Status: DC | PRN

## 2020-01-10 MED ORDER — PENTAFLUOROPROP-TETRAFLUOROETH EX AERO: 1.0000 "application " | INHALATION_SPRAY | CUTANEOUS | Status: DC | PRN

## 2020-01-10 MED ORDER — CYCLOSPORINE MODIFIED (NEORAL) 25 MG PO CAPS: 75.0000 mg | ORAL_CAPSULE | ORAL | Status: DC

## 2020-01-10 MED ORDER — HEPARIN SODIUM (PORCINE) 1000 UNIT/ML DIALYSIS: 1000.0000 [IU] | INTRAMUSCULAR | Status: DC | PRN

## 2020-01-10 MED ORDER — ALTEPLASE 2 MG IJ SOLR: 2.0000 mg | Freq: Once | INTRAMUSCULAR | Status: DC | PRN

## 2020-01-10 MED ORDER — CYCLOSPORINE 25 MG PO CAPS
75.0000 mg | ORAL_CAPSULE | Freq: Every day | ORAL | Status: DC
  Administered 2020-01-10 – 2020-01-14 (×4): 75 mg via ORAL
  Filled 2020-01-10 (×6): qty 3

## 2020-01-10 MED ORDER — VANCOMYCIN 50 MG/ML ORAL SOLUTION
500.0000 mg | Freq: Four times a day (QID) | ORAL | Status: DC
  Administered 2020-01-10 – 2020-01-13 (×12): 500 mg via ORAL
  Filled 2020-01-10 (×18): qty 10

## 2020-01-10 MED ORDER — ALPHA LIPOIC ACID 200 MG PO CAPS: 200.0000 mg | ORAL_CAPSULE | Freq: Two times a day (BID) | ORAL | Status: DC

## 2020-01-10 MED ORDER — METRONIDAZOLE IN NACL 5-0.79 MG/ML-% IV SOLN
500.0000 mg | Freq: Three times a day (TID) | INTRAVENOUS | Status: DC
  Administered 2020-01-10 – 2020-01-13 (×10): 500 mg via INTRAVENOUS
  Filled 2020-01-10 (×10): qty 100

## 2020-01-10 MED ORDER — AZATHIOPRINE 50 MG PO TABS
50.0000 mg | ORAL_TABLET | ORAL | Status: DC
  Administered 2020-01-10: 50 mg via ORAL
  Filled 2020-01-10 (×2): qty 1

## 2020-01-10 NOTE — Progress Notes (Addendum)
Pt came back from dialysis. Did Not received the report from dialysis nurse. Pt's vss.   Lavenia Atlas, RN

## 2020-01-10 NOTE — Progress Notes (Signed)
PT Cancellation Note  Patient Details Name: [name] MRN: [id number] DOB: 11/12/1950   Cancelled Treatment:    Reason Eval/Treat Not Completed: Patient at procedure or test/unavailable (HD)   Charlette Hennings B [name] 01/10/2020, 11:45 AM  Bayard Males, PT Acute Rehabilitation Services Pager: [phone] Office: [phone]

## 2020-01-10 NOTE — Progress Notes (Addendum)
[facility] Progress Note   Subjective:   Patient seen and examined at bedside.  Continues to have diarrhea but he reports improvement.  Still feeling a little weak and off balance, slightly improved from yesterday.  Admits to chills this AM.  Denies CP, SOB, edema, orthopnea, abdominal pain and n/v.    Objective Vitals:   01/09/20 2338 01/10/20 0242 01/10/20 0446 01/10/20 0758  BP: (!) 100/56  (!) 112/52 (!) 106/50  Pulse: 78  80 85  Resp: 18  13 20   Temp: 99.3 F (37.4 C)  99.3 F (37.4 C) 98.4 F (36.9 C)  TempSrc: Oral  Oral Oral  SpO2: 95%  99% 100%  Weight:  69.4 kg    Height:       Physical Exam General:chronically ill appearing male in NAD Heart:RRR Lungs:+course breath sounds on L, nml WOB on 2L O2 Abdomen:soft, NTND Extremities:no LE edema Dialysis Access: LU AVF +b   Filed Weights   01/08/20 1415 01/08/20 1828 01/10/20 0242  Weight: 71.4 kg 68 kg 69.4 kg    Intake/Output Summary (Last 24 hours) at 01/10/2020 0829 Last data filed at 01/09/2020 2356 Gross per 24 hour  Intake 600 ml  Output --  Net 600 ml    Additional Objective Labs: Basic Metabolic Panel: Recent Labs  Lab 01/07/20 2011 01/08/20 1059 01/09/20 0208 01/09/20 1049  NA 137 134* 136  --   K 3.7 3.7 3.8  --   CL 93* 94* 98  --   CO2 28 27 26   --   GLUCOSE 197* 103* 127*  --   BUN 44* 52* 20  --   CREATININE 8.78* 9.50* 4.72*  --   CALCIUM 8.7* 8.1* 8.3*  --   PHOS  --   --   --  2.6   Liver Function Tests: Recent Labs  Lab 01/07/20 2011 01/08/20 1059 01/09/20 0208  AST 47* 34 43*  ALT 25 22 22   ALKPHOS 235* 212* 211*  BILITOT 3.5* 2.7* 2.1*  PROT 6.0* 5.5* 5.7*  ALBUMIN 2.7* 2.4* 2.5*   CBC: Recent Labs  Lab 01/07/20 2011 01/08/20 1059 01/09/20 0208  WBC 15.2* 11.8* 9.5  NEUTROABS 14.7* 10.2* 8.5*  HGB 10.1* 9.5* 10.4*  HCT 31.5* 30.4* 32.0*  MCV 107.9* 108.2* 105.6*  PLT 156 125* 145*   Blood Culture    Component Value Date/Time   SDES BLOOD  RIGHT HAND 01/07/2020 1930   SPECREQUEST  01/07/2020 1930    BOTTLES DRAWN AEROBIC ONLY Blood Culture adequate volume   CULT  01/07/2020 1930    NO GROWTH 2 DAYS Performed at [facility], [address] [place], [place] [address]    REPTSTATUS PENDING 01/07/2020 1930   CBG: Recent Labs  Lab 01/08/20 1213 01/09/20 1252 01/09/20 1636 01/09/20 2129 01/10/20 0607  GLUCAP 98 228* 137* 132* 100*   Studies/Results: CT Angio Chest PE W and/or Wo Contrast  Result Date: 01/08/2020 CLINICAL DATA:  Shortness of breath; diarrhea EXAM: CT ANGIOGRAPHY CHEST CT ABDOMEN AND PELVIS WITH CONTRAST TECHNIQUE: Multidetector CT imaging of the chest was performed using the standard protocol during bolus administration of intravenous contrast. Multiplanar CT image reconstructions and MIPs were obtained to evaluate the vascular anatomy. Multidetector CT imaging of the abdomen and pelvis was performed using the standard protocol during bolus administration of intravenous contrast. CONTRAST:  1108mL OMNIPAQUE IOHEXOL 350 MG/ML SOLN COMPARISON:  Chest radiograph January 07, 2020;. CT angiogram chest September 03, 2019; CT abdomen and pelvis  September 02, 2015 FINDINGS: CTA CHEST FINDINGS Cardiovascular: No demonstrable pulmonary embolus. No appreciable thoracic aortic aneurysm or dissection. There are scattered foci of calcification in visualized great vessels as well as in the aorta. Note that there is a stent in the left innominate vein. There is also a stent in the left axillary vein. There are multiple foci of coronary artery calcification. There is left ventricular hypertrophy. No pericardial thickening or effusion. The main pulmonary outflow tract measures 3.2 cm in diameter, prominent. Mediastinum/Nodes: Thyroid appears unremarkable. No appreciable thoracic adenopathy. No esophageal lesions are evident. Lungs/Pleura: There is fibrosis throughout most of the left lung, similar to prior study. There is atelectatic change  in the right base. There is no new opacity to suggest edema or consolidation. No evident pleural effusion. Varicoid bronchiectatic change noted throughout the left lung. Note that there is left lung volume loss. Musculoskeletal: There are foci of degenerative change in the thoracic spine. No blastic or lytic bone lesions. No chest wall lesions appreciable. Review of the MIP images confirms the above findings. CT ABDOMEN and PELVIS FINDINGS Hepatobiliary: Liver contour is subtly lobular with question of a degree of underlying hepatic cirrhosis. No focal liver lesions are appreciable. There are apparent small gallstones within the gallbladder. The gallbladder wall does not appear appreciably thickened. There is no biliary duct dilatation. Pancreas: There is no evident pancreatic mass or inflammatory focus. Spleen: No splenic lesions are appreciable. Adrenals/Urinary Tract: Adrenals bilaterally appear normal. There is no evident renal mass or hydronephrosis on either side. There is a 2 mm calculus in the mid right kidney. There are several adjacent calculi in the lower pole the right kidney measuring between 1 and 4 mm in size. There is a 2 mm calculus in the mid left kidney. There is no appreciable ureteral calculus on either side. Urinary bladder wall is diffusely thickened. Note that urinary bladder is nearly empty at this time. Stomach/Bowel: There are diverticula in the descending colon and sigmoid colon without wall thickening or changes suggesting diverticulitis. Elsewhere there is no appreciable bowel wall mesenteric thickening. No evident bowel obstruction. The terminal ileum appears normal. There is no appreciable free air or portal venous air. Vascular/Lymphatic: No abdominal aortic aneurysm. There is extensive aortic and iliac artery atherosclerosis. There is also extensive calcification in more distal pelvic arterial vessels. Major venous structures appear patent. There is no appreciable adenopathy in the  abdomen or pelvis. Reproductive: Prostate has a somewhat inhomogeneous echotexture with prominence of the prostate. A well-defined prostatic mass is not seen by CT. The seminal vesicles appear unremarkable. Other: The appendix appears normal. No evident abscess or ascites in the abdomen or pelvis. There is mild fat in the umbilicus. Musculoskeletal: There is degenerative change in the lumbar spine. No blastic or lytic bone lesions. No intramuscular lesions are evident. Review of the MIP images confirms the above findings. IMPRESSION: CT angiogram chest: 1. No demonstrable pulmonary embolus. No thoracic aortic aneurysm or dissection. There is aortic atherosclerosis as well as foci of great vessel and coronary artery calcification. Stents in the left innominate and axillary veins noted. 2. Widespread fibrosis with volume loss throughout the left lung. Varicoid bronchiectatic change throughout the left lung, stable. Right lung clear except for scattered areas of atelectatic change. 3. Prominence of the main pulmonary outflow tract, a finding indicative of pulmonary arterial hypertension. 4.  No adenopathy. CT abdomen and pelvis: 1. Liver contour is subtly lobular. Question a degree of underlying hepatic cirrhosis. No focal liver lesions  are appreciable. 2.  Small gallstones.  No gallbladder wall thickening evident. 3. Left colonic diverticula without diverticulitis. No bowel wall thickening or bowel obstruction. No abscess in the abdomen or pelvis. Appendix appears normal. 4. Nonobstructing calculi in each kidney. No hydronephrosis or ureteral calculus on either side. Generalized urinary bladder wall thickening, likely due to a degree of cystitis. 5. Somewhat inhomogeneous attenuation of the prostate. Significance of this finding uncertain. Clinical assessment of prostate and appropriate laboratory correlation advised in this regard. 6. Aortic Atherosclerosis (ICD10-I70.0). There is extensive pelvic arterial vascular  calcification bilaterally. Electronically Signed   By: Lowella Grip III M.D.   On: 01/08/2020 10:14   CT ABDOMEN PELVIS W CONTRAST  Result Date: 01/08/2020 CLINICAL DATA:  Shortness of breath; diarrhea EXAM: CT ANGIOGRAPHY CHEST CT ABDOMEN AND PELVIS WITH CONTRAST TECHNIQUE: Multidetector CT imaging of the chest was performed using the standard protocol during bolus administration of intravenous contrast. Multiplanar CT image reconstructions and MIPs were obtained to evaluate the vascular anatomy. Multidetector CT imaging of the abdomen and pelvis was performed using the standard protocol during bolus administration of intravenous contrast. CONTRAST:  152mL OMNIPAQUE IOHEXOL 350 MG/ML SOLN COMPARISON:  Chest radiograph January 07, 2020;. CT angiogram chest September 03, 2019; CT abdomen and pelvis September 02, 2015 FINDINGS: CTA CHEST FINDINGS Cardiovascular: No demonstrable pulmonary embolus. No appreciable thoracic aortic aneurysm or dissection. There are scattered foci of calcification in visualized great vessels as well as in the aorta. Note that there is a stent in the left innominate vein. There is also a stent in the left axillary vein. There are multiple foci of coronary artery calcification. There is left ventricular hypertrophy. No pericardial thickening or effusion. The main pulmonary outflow tract measures 3.2 cm in diameter, prominent. Mediastinum/Nodes: Thyroid appears unremarkable. No appreciable thoracic adenopathy. No esophageal lesions are evident. Lungs/Pleura: There is fibrosis throughout most of the left lung, similar to prior study. There is atelectatic change in the right base. There is no new opacity to suggest edema or consolidation. No evident pleural effusion. Varicoid bronchiectatic change noted throughout the left lung. Note that there is left lung volume loss. Musculoskeletal: There are foci of degenerative change in the thoracic spine. No blastic or lytic bone lesions. No chest wall  lesions appreciable. Review of the MIP images confirms the above findings. CT ABDOMEN and PELVIS FINDINGS Hepatobiliary: Liver contour is subtly lobular with question of a degree of underlying hepatic cirrhosis. No focal liver lesions are appreciable. There are apparent small gallstones within the gallbladder. The gallbladder wall does not appear appreciably thickened. There is no biliary duct dilatation. Pancreas: There is no evident pancreatic mass or inflammatory focus. Spleen: No splenic lesions are appreciable. Adrenals/Urinary Tract: Adrenals bilaterally appear normal. There is no evident renal mass or hydronephrosis on either side. There is a 2 mm calculus in the mid right kidney. There are several adjacent calculi in the lower pole the right kidney measuring between 1 and 4 mm in size. There is a 2 mm calculus in the mid left kidney. There is no appreciable ureteral calculus on either side. Urinary bladder wall is diffusely thickened. Note that urinary bladder is nearly empty at this time. Stomach/Bowel: There are diverticula in the descending colon and sigmoid colon without wall thickening or changes suggesting diverticulitis. Elsewhere there is no appreciable bowel wall mesenteric thickening. No evident bowel obstruction. The terminal ileum appears normal. There is no appreciable free air or portal venous air. Vascular/Lymphatic: No abdominal aortic aneurysm. There  is extensive aortic and iliac artery atherosclerosis. There is also extensive calcification in more distal pelvic arterial vessels. Major venous structures appear patent. There is no appreciable adenopathy in the abdomen or pelvis. Reproductive: Prostate has a somewhat inhomogeneous echotexture with prominence of the prostate. A well-defined prostatic mass is not seen by CT. The seminal vesicles appear unremarkable. Other: The appendix appears normal. No evident abscess or ascites in the abdomen or pelvis. There is mild fat in the umbilicus.  Musculoskeletal: There is degenerative change in the lumbar spine. No blastic or lytic bone lesions. No intramuscular lesions are evident. Review of the MIP images confirms the above findings. IMPRESSION: CT angiogram chest: 1. No demonstrable pulmonary embolus. No thoracic aortic aneurysm or dissection. There is aortic atherosclerosis as well as foci of great vessel and coronary artery calcification. Stents in the left innominate and axillary veins noted. 2. Widespread fibrosis with volume loss throughout the left lung. Varicoid bronchiectatic change throughout the left lung, stable. Right lung clear except for scattered areas of atelectatic change. 3. Prominence of the main pulmonary outflow tract, a finding indicative of pulmonary arterial hypertension. 4.  No adenopathy. CT abdomen and pelvis: 1. Liver contour is subtly lobular. Question a degree of underlying hepatic cirrhosis. No focal liver lesions are appreciable. 2.  Small gallstones.  No gallbladder wall thickening evident. 3. Left colonic diverticula without diverticulitis. No bowel wall thickening or bowel obstruction. No abscess in the abdomen or pelvis. Appendix appears normal. 4. Nonobstructing calculi in each kidney. No hydronephrosis or ureteral calculus on either side. Generalized urinary bladder wall thickening, likely due to a degree of cystitis. 5. Somewhat inhomogeneous attenuation of the prostate. Significance of this finding uncertain. Clinical assessment of prostate and appropriate laboratory correlation advised in this regard. 6. Aortic Atherosclerosis (ICD10-I70.0). There is extensive pelvic arterial vascular calcification bilaterally. Electronically Signed   By: Lowella Grip III M.D.   On: 01/08/2020 10:14    Medications: . metronidazole     . aspirin  81 mg Oral Daily  . azaTHIOprine  50 mg Oral Q M,W,F-HD  . calcium acetate  1,334 mg Oral TID AC  . Chlorhexidine Gluconate Cloth  6 each Topical Q0600  . cycloSPORINE  100  mg Oral QHS  . cycloSPORINE  75 mg Oral Daily  . dorzolamide-timolol  1 drop Both Eyes BID  . fluticasone  2 spray Each Nare Daily  . heparin  5,000 Units Subcutaneous Q8H  . insulin aspart  0-9 Units Subcutaneous TID AC & HS  . insulin aspart  5 Units Subcutaneous QAC breakfast  . insulin aspart  6 Units Subcutaneous QAC lunch  . insulin aspart  6 Units Subcutaneous QAC supper  . midodrine  10 mg Oral TID WC  . multivitamin with minerals  1 tablet Oral Q T,Th,S,Su  . pantoprazole  40 mg Oral BID  . prednisoLONE acetate  1 drop Both Eyes BID  . predniSONE  5 mg Oral Q breakfast  . pyridOXINE  100 mg Oral Daily  . sulfamethoxazole-trimethoprim  1 tablet Oral Q M,W,F-2000  . valGANciclovir  450 mg Oral Once per day on Mon Fri  . vancomycin  500 mg Oral Q6H    Dialysis Orders: MWF -[facility] 4hrs, BFR400, DZH299, EDW 67.5kg,2K/2Ca  Access:LU AVF Heparinnone Hectorol95mcg IV qHD   Assessment/Plan: 1. Sepsis- etiology unclear, ?PNA, ?GI, febrile, WBC/lactic acid improved. CXR with no acute findings. CTA negative for PE, CT abd with no acute abnormalities. GI and Resp panel neg, C diff +,  BC with NGTD. On cefepime, metronidazole and IV vanc. Per primary 2. Hypoxia- Intermittent O2 use during admission - on 2L O2 during exam today.Possibly 2/2 new infection vs fluid.  Now close to dry weight.  3. Diarrhea - CT abd/pelvis with no acute abnormalities. GI panel negative. C Diff +. 4. Hepatic cirrhosis - New finding on [place] completed last week due to elevated labs. Consulting GI.  5. ESRD- On HD MWF. lastK 3.8, use increased K bath. No Heparin.  HD today per regular schedule. 6. Hypotension/volume- chronic hypotension on midodrine. Plan for UF to dry today.  Does not appear volume overloaded.  7. Anemiaof CKD- last Hgb 10.4. Will hold off on starting ESA for now.  8. Secondary Hyperparathyroidism -CCa and phos in goal. Continue binders, VDRA.  9. 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 Per primary 11. DMT2 -per primary 12. GERD - Hx bleeding duodenal ulcer   Jen Mow, PA-C [facility] 01/10/2020,8:29 AM  LOS: 2 days   I have seen and examined this patient and agree with plan and assessment in the above note with renal recommendations/intervention highlighted.  Continue with HD on MWF schedule while he remains an inpatient. [place] John A Leron Stoffers,MD 01/10/2020 9:55 AM

## 2020-01-10 NOTE — Progress Notes (Signed)
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 Work up thus far unrevealing. Sars Covid 19 and viral resp panel negative.  No evidence of diverticulitis on CT scan.  CTA chest negative for PE.  No clear evidence of pulmonary infiltrates.  Started on IV abx empirically in the ED. Blood cx negative to date.  States was having diarrhea prior to admission and had watery stools this AM. GI panel negative.  c-diff pcr, antigen and toxin positive.  Started on C-diff directed treatment.  Enteric precautions.   Assessment/Plan: Principal Problem:   Sepsis with acute organ dysfunction (HCC) Active Problems:   GERD without esophagitis   Essential hypertension   ILD (interstitial lung disease) ([place])   Acute respiratory failure with hypoxia (HCC)   End-stage renal disease on hemodialysis (HCC)   Lactic acidosis   Mixed diabetic hyperlipidemia associated with type 2 diabetes mellitus ([place])   Hepatic cirrhosis ([place])   Uncontrolled type 2 diabetes mellitus with hyperglycemia, with long-term current use of insulin (HCC)   IV infiltrate, initial encounter   Acute diarrhea   Acute metabolic encephalopathy  Sepsis secondary to severe C. difficile infection with diarrhea  Presented with diarrhea and sirs criteria including fever with T-max 101.7, substantial leukocytosis WBC 15 K, tachycardia heart rate 101 in the setting of immunosuppression and ESRD on HD MWF.      GI panel, C. difficile PCR, antigen and toxin positive  Started on C-diff directed treatment for severe C. difficile.  Enteric precautions.  Patient is immunocompromised due to known history of R lung transplant on immunosuppressive therapy including azathioprine, cyclosporine and prednisone.   Chest x-ray reveals no obvious evidence of new infiltrate however there may be a brewing pneumonia in the patient's left lung which is obscured by chronic infiltrates on chest x-ray.  Additionally, patient is complaining of a 1 day history of watery diarrhea so a concurrent infectious colitis is also possible.  Case was discussed with covering transplant physician at [facility] by the emergency department provider.  This was Dr. Robley Fries covering for Dr. Wynetta Emery.  They recommended obtaining CT angiogram of the chest as well as CT imaging of the abdomen to identify for any evidence of pulmonary emboli or sources of infection.  It is also recommended that the patient undergo a respiratory viral panel including testing for RSV  COVID-19 PCR testing negative, acute respiratory panel negative, GI panel negative.  Received broad-spectrum intravenous antibiotic therapy with cefepime, metronidazole and IV vancomycin, dced on 8/6  Blood cultures have been obtained 8/5.  Continue to follow cultures.  Negative to date.  Active Problems:   Acute respiratory failure with hypoxia (HCC) Possibly related to atelectasis  Patient presenting with new onset hypoxia.  Patient is not on oxygen supplementation at home.  C/w incentive spirometer.  Maintain O2 saturation greater than 90%  Continue to monitor vital signs    Resolved acute metabolic encephalopathy Currently back to his baseline mentation, alert and oriented x4.   Lethargic on presentation.    Conversational, but still weak appearing, states he feels better  this morning    End-stage renal disease on hemodialysis MWF(HCC) Nephrology following Was  hemodialyzed on April 21.  History of hypotension On midodrine prior to admission Increased dose of midodrine 10 mg 3 times daily. Closely monitor vital signs.    Hepatic cirrhosis ([place])   Possible early cirrhosis on imaging  Monitor LFTs on current immunosuppressants  patient's transplant provider Dr. Wynetta Emery at [facility]  Patient denies alcohol use    Uncontrolled type 2 diabetes mellitus with hyperglycemia, likely contributed by chronic steroid use in the setting of R lung transplant. Hemoglobin A1c 5.8 on 01/08/20 Continue insulin sliding scale    IV infiltrate, initial encounter   Patient unfortunately experienced a right upper extremity small volume infiltration of contrast upon first attempt of performing contrast CTs in the emergency department.  Patient was immediately evaluated by Dr. Gerilyn Nestle with radiology who placed extravasation protocol orders and recommended conservative management.    GERD without esophagitis   Continue home regimen of PPI  Of note, patient has known history of gastrointestinal bleeding secondary to bleeding duodenal ulcer.    Code Status:  Full code Family Communication:  updated his wife via phone    Consultants:  Nephrology  Interventional radiology  Procedures:  Contrast extravasation  Antimicrobials:  IV vancomycin  Cefepime  IV Flagyl  DVT prophylaxis: Subcu heparin 3 times daily  Status is: Inpatient    Dispo:  Patient From: Home  Planned Disposition: Home with Health Care Svc  Expected discharge date: 01/14/20  Medically stable for discharge: No, Tx for severe c-diff infection        Objective: Vitals:   01/10/20 1430 01/10/20 1500 01/10/20 1530 01/10/20 1600  BP: 117/65 124/75 117/68 (!) 176/86  Pulse: 78 76 78 67  Resp:      Temp:      TempSrc:      SpO2:      Weight:      Height:        Intake/Output Summary (Last 24 hours) at 01/10/2020 1659 Last data filed at 01/10/2020 1000 Gross  per 24 hour  Intake 500 ml  Output 1 ml  Net 499 ml   Filed Weights   01/08/20 1828 01/10/20 0242 01/10/20 1159  Weight: 68 kg 69.4 kg 69.4 kg    Exam:  . General: 69 y.o. year-old male chronically ill-appearing no acute distress.  Alert and wanted x3. . Cardiovascular: Regular rate and rhythm no rubs or gallops.   Marland Kitchen Respiratory: Clear to auscultation no wheezes or rales.   . Abdomen: Soft nontender normal bowel sounds present.   . Musculoskeletal: No lower extremity edema bilaterally. Marland Kitchen Psychiatry: Mood is appropriate for condition and setting.   Data Reviewed: CBC: Recent Labs  Lab 01/07/20 2011 01/08/20 1059 01/09/20 0208 01/10/20 1355  WBC 15.2* 11.8* 9.5 7.6  NEUTROABS 14.7* 10.2* 8.5* 5.6  HGB 10.1* 9.5* 10.4* 9.6*  HCT 31.5* 30.4* 32.0* 30.5*  MCV 107.9* 108.2* 105.6* 106.3*  PLT 156 125* 145* 341*   Basic Metabolic Panel: Recent Labs  Lab 01/07/20 2011 01/08/20 1059 01/09/20 0208 01/09/20 1049 01/10/20 1355  NA 137 134* 136  --  135  K 3.7 3.7 3.8  --  3.8  CL 93* 94* 98  --  97*  CO2 28 27 26   --  25  GLUCOSE 197* 103* 127*  --  112*  BUN 44* 52* 20  --  14  CREATININE 8.78* 9.50* 4.72*  --  3.43*  CALCIUM 8.7* 8.1* 8.3*  --  8.2*  MG  --  2.1 1.9  --   --   PHOS  --   --   --  2.6 1.4*   GFR: Estimated Creatinine Clearance: 17.5 mL/min (A) (by C-G formula based on SCr of 3.43 mg/dL (H)). Liver Function Tests: Recent Labs  Lab 01/07/20 2011 01/08/20 1059 01/09/20 0208 01/10/20 1355  AST 47* 34 43* 37  ALT 25 22 22 23   ALKPHOS 235* 212* 211* 232*  BILITOT 3.5* 2.7* 2.1* 1.5*  PROT 6.0* 5.5* 5.7* 5.9*  ALBUMIN 2.7* 2.4* 2.5* 2.5*   No results for input(s): LIPASE, AMYLASE in the last 168 hours. No results for input(s): AMMONIA in the last 168 hours. Coagulation Profile: Recent Labs  Lab 01/07/20 2011  INR 1.1   Cardiac Enzymes: No results for input(s): CKTOTAL, CKMB, CKMBINDEX, TROPONINI in the last 168 hours. BNP (last 3  results) No results for input(s): PROBNP in the last 8760 hours. HbA1C: Recent Labs    01/08/20 1059  HGBA1C 5.8*   CBG: Recent Labs  Lab 01/09/20 1252 01/09/20 1636 01/09/20 2129 01/10/20 0607 01/10/20 1137  GLUCAP 228* 137* 132* 100* 110*   Lipid Profile: No results for input(s): CHOL, HDL, LDLCALC, TRIG, CHOLHDL, LDLDIRECT in the last 72 hours. Thyroid Function Tests: No results for input(s): TSH, T4TOTAL, FREET4, T3FREE, THYROIDAB in the last 72 hours. Anemia Panel: No results for input(s): VITAMINB12, FOLATE, FERRITIN, TIBC, IRON, RETICCTPCT in the last 72 hours. Urine analysis:    Component Value Date/Time   COLORURINE AMBER (A) 12/31/2015 0515   APPEARANCEUR CLOUDY (A) 12/31/2015 0515   LABSPEC 1.016 12/31/2015 0515   PHURINE 7.0 12/31/2015 0515   GLUCOSEU NEGATIVE 12/31/2015 0515   HGBUR NEGATIVE 12/31/2015 0515   BILIRUBINUR SMALL (A) 12/31/2015 0515   KETONESUR 15 (A) 12/31/2015 0515   PROTEINUR 100 (A) 12/31/2015 0515   UROBILINOGEN 2.0 (H) 08/15/2013 1916   NITRITE NEGATIVE 12/31/2015 0515   LEUKOCYTESUR NEGATIVE 12/31/2015 0515   Sepsis Labs: @LABRCNTIP (procalcitonin:4,lacticidven:4)  ) Recent Results (from the past 240 hour(s))  Culture, blood (Routine x 2)     Status: None (Preliminary result)   Collection Time: 01/07/20  7:15 PM   Specimen: BLOOD RIGHT ARM  Result Value Ref Range Status   Specimen Description BLOOD RIGHT ARM  Final   Special Requests   Final    BOTTLES DRAWN AEROBIC AND ANAEROBIC Blood Culture results may not be optimal due to an excessive volume of blood received in culture bottles   Culture   Final    NO GROWTH 3 DAYS Performed at [facility], [place] [address], [place], [place] [address]    Report Status PENDING  Incomplete  Culture, blood (Routine x 2)     Status: None (Preliminary result)   Collection Time: 01/07/20  7:30 PM   Specimen: BLOOD RIGHT HAND  Result Value Ref Range Status   Specimen Description BLOOD  RIGHT HAND  Final   Special Requests   Final    BOTTLES DRAWN AEROBIC ONLY Blood Culture adequate volume   Culture   Final    NO GROWTH 3 DAYS Performed at [facility], [place] [address], [place], [place] [address]    Report Status PENDING  Incomplete  SARS Coronavirus 2 by RT PCR (hospital order, performed in [facility] hospital lab) Nasopharyngeal Nasopharyngeal Swab     Status: None   Collection Time: 01/07/20  7:43 PM   Specimen: Nasopharyngeal Swab  Result Value Ref Range Status  SARS Coronavirus 2 NEGATIVE NEGATIVE Final    Comment: (NOTE) SARS-CoV-2 target nucleic acids are NOT DETECTED.  The SARS-CoV-2 RNA is generally detectable in upper and lower respiratory specimens during the acute phase of infection. The lowest concentration of SARS-CoV-2 viral copies this assay can detect is 250 copies / mL. A negative result does not preclude SARS-CoV-2 infection and should not be used as the sole basis for treatment or other patient management decisions.  A negative result may occur with improper specimen collection / handling, submission of specimen other than nasopharyngeal swab, presence of viral mutation(s) within the areas targeted by this assay, and inadequate number of viral copies (<250 copies / mL). A negative result must be combined with clinical observations, patient history, and epidemiological information.  Fact Sheet for Patients:   StrictlyIdeas.no  Fact Sheet for Healthcare Providers: BankingDealers.co.za  This test is not yet approved or  cleared by the [place] FDA and has been authorized for detection and/or diagnosis of SARS-CoV-2 by FDA under an Emergency Use Authorization (EUA).  This EUA will remain in effect (meaning this test can be used) for the duration of the COVID-19 declaration under Section 564(b)(1) of the Act, 21 U.S.C. section 360bbb-3(b)(1), unless the authorization is terminated  or revoked sooner.  Performed at [facility], [place] [address], [place],  [address]   Resp Panel by RT PCR (RSV, Flu A&B, Covid) - Nasopharyngeal Swab     Status: None   Collection Time: 01/08/20  7:16 AM   Specimen: Nasopharyngeal Swab  Result Value Ref Range Status   SARS Coronavirus 2 by RT PCR NEGATIVE NEGATIVE Final    Comment: (NOTE) SARS-CoV-2 target nucleic acids are NOT DETECTED.  The SARS-CoV-2 RNA is generally detectable in upper respiratoy specimens during the acute phase of infection. The lowest concentration of SARS-CoV-2 viral copies this assay can detect is 131 copies/mL. A negative result does not preclude SARS-Cov-2 infection and should not be used as the sole basis for treatment or other patient management decisions. A negative result may occur with  improper specimen collection/handling, submission of specimen other than nasopharyngeal swab, presence of viral mutation(s) within the areas targeted by this assay, and inadequate number of viral copies (<131 copies/mL). A negative result must be combined with clinical observations, patient history, and epidemiological information. The expected result is Negative.  Fact Sheet for Patients:  PinkCheek.be  Fact Sheet for Healthcare Providers:  GravelBags.it  This test is no t yet approved or cleared by the [place] FDA and  has been authorized for detection and/or diagnosis of SARS-CoV-2 by FDA under an Emergency Use Authorization (EUA). This EUA will remain  in effect (meaning this test can be used) for the duration of the COVID-19 declaration under Section 564(b)(1) of the Act, 21 U.S.C. section 360bbb-3(b)(1), unless the authorization is terminated or revoked sooner.     Influenza A by PCR NEGATIVE NEGATIVE Final   Influenza B by PCR NEGATIVE NEGATIVE Final    Comment: (NOTE) The Xpert Xpress SARS-CoV-2/FLU/RSV assay is intended as  an aid in  the diagnosis of influenza from Nasopharyngeal swab specimens and  should not be used as a sole basis for treatment. Nasal washings and  aspirates are unacceptable for Xpert Xpress SARS-CoV-2/FLU/RSV  testing.  Fact Sheet for Patients: PinkCheek.be  Fact Sheet for Healthcare Providers: GravelBags.it  This test is not yet approved or cleared by the [place] FDA and  has been authorized for detection and/or diagnosis of SARS-CoV-2  by  FDA under an Emergency Use Authorization (EUA). This EUA will remain  in effect (meaning this test can be used) for the duration of the  Covid-19 declaration under Section 564(b)(1) of the Act, 21  U.S.C. section 360bbb-3(b)(1), unless the authorization is  terminated or revoked.    Respiratory Syncytial Virus by PCR NEGATIVE NEGATIVE Final    Comment: (NOTE) Fact Sheet for Patients: PinkCheek.be  Fact Sheet for Healthcare Providers: GravelBags.it  This test is not yet approved or cleared by the [place] FDA and  has been authorized for detection and/or diagnosis of SARS-CoV-2 by  FDA under an Emergency Use Authorization (EUA). This EUA will remain  in effect (meaning this test can be used) for the duration of the  COVID-19 declaration under Section 564(b)(1) of the Act, 21 U.S.C.  section 360bbb-3(b)(1), unless the authorization is terminated or  revoked. Performed at [facility], [place] [address], [place], [place] [address]   Gastrointestinal Panel by PCR , Stool     Status: None   Collection Time: 01/08/20  7:16 AM   Specimen: Stool  Result Value Ref Range Status   Campylobacter species NOT DETECTED NOT DETECTED Final   Plesimonas shigelloides NOT DETECTED NOT DETECTED Final   Salmonella species NOT DETECTED NOT DETECTED Final   Yersinia enterocolitica NOT DETECTED NOT DETECTED Final   Vibrio species  NOT DETECTED NOT DETECTED Final   Vibrio cholerae NOT DETECTED NOT DETECTED Final   Enteroaggregative E coli (EAEC) NOT DETECTED NOT DETECTED Final   Enteropathogenic E coli (EPEC) NOT DETECTED NOT DETECTED Final   Enterotoxigenic E coli (ETEC) NOT DETECTED NOT DETECTED Final   Shiga like toxin producing E coli (STEC) NOT DETECTED NOT DETECTED Final   Shigella/Enteroinvasive E coli (EIEC) NOT DETECTED NOT DETECTED Final   Cryptosporidium NOT DETECTED NOT DETECTED Final   Cyclospora cayetanensis NOT DETECTED NOT DETECTED Final   Entamoeba histolytica NOT DETECTED NOT DETECTED Final   Giardia lamblia NOT DETECTED NOT DETECTED Final   Adenovirus F40/41 NOT DETECTED NOT DETECTED Final   Astrovirus NOT DETECTED NOT DETECTED Final   Norovirus GI/GII NOT DETECTED NOT DETECTED Final   Rotavirus A NOT DETECTED NOT DETECTED Final   Sapovirus (I, II, IV, and V) NOT DETECTED NOT DETECTED Final    Comment: Performed at [facility], [place] [address] [place], [place] [address]  MRSA PCR Screening     Status: None   Collection Time: 01/09/20  8:20 AM   Specimen: Nasal Mucosa; Nasopharyngeal  Result Value Ref Range Status   MRSA by PCR NEGATIVE NEGATIVE Final    Comment:        The GeneXpert MRSA Assay (FDA approved for NASAL specimens only), is one component of a comprehensive MRSA colonization surveillance program. It is not intended to diagnose MRSA infection nor to guide or monitor treatment for MRSA infections. Performed at [facility], [place] [address] [place], [place] [address]   C Difficile Quick Screen w PCR reflex     Status: Abnormal   Collection Time: 01/09/20 12:23 PM   Specimen: STOOL  Result Value Ref Range Status   C Diff antigen POSITIVE (A) NEGATIVE Final   C Diff toxin POSITIVE (A) NEGATIVE Final   C Diff interpretation Toxin producing C. difficile detected.  Final    Comment: CRITICAL RESULT CALLED TO, READ BACK BY AND VERIFIED WITH: Patrcia Dolly RN 1610 01/09/20  A BROWNING Performed at [facility], [place]  [address], [place], [place] [address]       Studies: No results found.  Scheduled Meds: . aspirin  81 mg Oral Daily  . azaTHIOprine  50 mg Oral Q M,W,F-HD  . calcium acetate  1,334 mg Oral TID AC  . Chlorhexidine Gluconate Cloth  6 each Topical Q0600  . cycloSPORINE  100 mg Oral QHS  . cycloSPORINE  75 mg Oral Daily  . dorzolamide-timolol  1 drop Both Eyes BID  . fluticasone  2 spray Each Nare Daily  . heparin  5,000 Units Subcutaneous Q8H  . insulin aspart  0-9 Units Subcutaneous TID AC & HS  . insulin aspart  5 Units Subcutaneous QAC breakfast  . insulin aspart  6 Units Subcutaneous QAC lunch  . insulin aspart  6 Units Subcutaneous QAC supper  . midodrine  10 mg Oral TID WC  . multivitamin with minerals  1 tablet Oral Q T,Th,S,Su  . pantoprazole  40 mg Oral BID  . prednisoLONE acetate  1 drop Both Eyes BID  . predniSONE  5 mg Oral Q breakfast  . pyridOXINE  100 mg Oral Daily  . sulfamethoxazole-trimethoprim  1 tablet Oral Q M,W,F-2000  . traMADol      . valGANciclovir  450 mg Oral Once per day on Mon Fri  . vancomycin  500 mg Oral Q6H    Continuous Infusions: . sodium chloride    . sodium chloride    . metronidazole       LOS: 2 days     Kayleen Memos, MD [facility] Pager [phone]  If 7PM-7AM, please contact night-coverage [email or web address] Password [email or web address] 01/10/2020, 4:59 PM

## 2020-01-10 NOTE — Progress Notes (Signed)
PHARMACIST - PHYSICIAN ORDER COMMUNICATION  CONCERNING: P&T Medication Policy on Herbal Medications  DESCRIPTION:  This patient's order for:  Alpha-lipoic acid  has been noted.  This product(s) is classified as an "herbal" or natural product. Due to a lack of definitive safety studies or FDA approval, nonstandard manufacturing practices, plus the potential risk of unknown drug-drug interactions while on inpatient medications, the Pharmacy and Therapeutics Committee does not permit the use of "herbal" or natural products of this type within [facility].   ACTION TAKEN: The pharmacy department is unable to verify this order at this time and your patient has been informed of this safety policy. Please reevaluate patient's clinical condition at discharge and address if the herbal or natural product(s) should be resumed at that time.  Sherlon Handing, PharmD, BCPS Please see amion for complete clinical pharmacist phone list 01/10/2020 5:40 AM

## 2020-01-11 LAB — GLUCOSE, CAPILLARY
Glucose-Capillary: 107 mg/dL — ABNORMAL HIGH (ref 70–99)
Glucose-Capillary: 133 mg/dL — ABNORMAL HIGH (ref 70–99)
Glucose-Capillary: 133 mg/dL — ABNORMAL HIGH (ref 70–99)
Glucose-Capillary: 132 mg/dL — ABNORMAL HIGH (ref 70–99)

## 2020-01-11 MED ORDER — DARBEPOETIN ALFA 25 MCG/0.42ML IJ SOSY
25.0000 ug | PREFILLED_SYRINGE | INTRAMUSCULAR | Status: DC
  Filled 2020-01-11: qty 0.42

## 2020-01-11 NOTE — Progress Notes (Signed)
[name] Progress Note   Subjective: Says he felt confused this AM, speech garbled-couldn't finish an entire sentence. Better now. No diarrhea this AM.   Objective Vitals:   01/10/20 2010 01/10/20 2344 01/11/20 0422 01/11/20 0428  BP: (!) 109/93 109/60 (!) 98/59   Pulse:  66 64   Resp: 18 18 18    Temp: 98 F (36.7 C) 97.6 F (36.4 C) (!) 97.5 F (36.4 C)   TempSrc: Oral Oral Oral   SpO2: 97% 100% 100%   Weight:    67.8 kg  Height:       Physical Exam General: Chronically ill appearing male in NAD Heart: S1,S2 RRR Lungs: Bilateral breath sounds with coarse rhonchi.  Abdomen: Active BS. Extremities: No LE edema Dialysis Access: L AVF + bruit   Additional Objective Labs: Basic Metabolic Panel: Recent Labs  Lab 01/08/20 1059 01/09/20 0208 01/09/20 1049 01/10/20 1355  NA 134* 136  --  135  K 3.7 3.8  --  3.8  CL 94* 98  --  97*  CO2 27 26  --  25  GLUCOSE 103* 127*  --  112*  BUN 52* 20  --  14  CREATININE 9.50* 4.72*  --  3.43*  CALCIUM 8.1* 8.3*  --  8.2*  PHOS  --   --  2.6 1.4*   Liver Function Tests: Recent Labs  Lab 01/08/20 1059 01/09/20 0208 01/10/20 1355  AST 34 43* 37  ALT 22 22 23   ALKPHOS 212* 211* 232*  BILITOT 2.7* 2.1* 1.5*  PROT 5.5* 5.7* 5.9*  ALBUMIN 2.4* 2.5* 2.5*   No results for input(s): LIPASE, AMYLASE in the last 168 hours. CBC: Recent Labs  Lab 01/07/20 2011 01/07/20 2011 01/08/20 1059 01/09/20 0208 01/10/20 1355  WBC 15.2*   < > 11.8* 9.5 7.6  NEUTROABS 14.7*   < > 10.2* 8.5* 5.6  HGB 10.1*   < > 9.5* 10.4* 9.6*  HCT 31.5*   < > 30.4* 32.0* 30.5*  MCV 107.9*  --  108.2* 105.6* 106.3*  PLT 156   < > 125* 145* 133*   < > = values in this interval not displayed.   Blood Culture    Component Value Date/Time   SDES BLOOD RIGHT HAND 01/07/2020 1930   SPECREQUEST  01/07/2020 1930    BOTTLES DRAWN AEROBIC ONLY Blood Culture adequate volume   CULT  01/07/2020 1930    NO GROWTH 4 DAYS Performed at  [facility], [place] [address], [place], [place] [address]    REPTSTATUS PENDING 01/07/2020 1930    Cardiac Enzymes: No results for input(s): CKTOTAL, CKMB, CKMBINDEX, TROPONINI in the last 168 hours. CBG: Recent Labs  Lab 01/10/20 0607 01/10/20 1137 01/10/20 1727 01/10/20 2212 01/11/20 0621  GLUCAP 100* 110* 102* 143* 107*   Iron Studies: No results for input(s): IRON, TIBC, TRANSFERRIN, FERRITIN in the last 72 hours. @lablastinr3 @ Studies/Results: No results found. Medications:  metronidazole 500 mg (01/11/20 3009)    aspirin  81 mg Oral Daily   azaTHIOprine  50 mg Oral Q M,W,F-HD   calcium acetate  1,334 mg Oral TID AC   Chlorhexidine Gluconate Cloth  6 each Topical Q0600   cycloSPORINE  100 mg Oral QHS   cycloSPORINE  75 mg Oral Daily   dorzolamide-timolol  1 drop Both Eyes BID   fluticasone  2 spray Each Nare Daily   heparin  5,000 Units Subcutaneous Q8H   insulin aspart  0-9 Units Subcutaneous TID  AC & HS   insulin aspart  5 Units Subcutaneous QAC breakfast   insulin aspart  6 Units Subcutaneous QAC lunch   insulin aspart  6 Units Subcutaneous QAC supper   midodrine  10 mg Oral TID WC   multivitamin with minerals  1 tablet Oral Q T,Th,S,Su   pantoprazole  40 mg Oral BID   prednisoLONE acetate  1 drop Both Eyes BID   predniSONE  5 mg Oral Q breakfast   pyridOXINE  100 mg Oral Daily   sulfamethoxazole-trimethoprim  1 tablet Oral Q M,W,F-2000   valGANciclovir  450 mg Oral Once per day on Mon Fri   vancomycin  500 mg Oral Q6H     MWF-NW 4hrs, BFR400, BTD974, EDW 67.5kg,2K/2Ca Access:LU AVF No Heparin  Hectorol32mcg IV qHD   Assessment/Plan: 1. Sepsis secondary to severe C Difficile- On oral Vanc. Primary has discussed case with [facility] transplant MD.  Per primary 2. Hypoxia-Intermittent O2 use during admission - on 2L O2 during exam today.Possibly 2/2 new infectionvs fluid.Now close to dry weight. 3. Diarrhea  - See # 1. CT abd/pelvis with no acute abnormalities. GI panelnegative. C Diff +. 4. Hepatic cirrhosis - New finding on [place] completed last week due to elevated labs. Per primary   5. ESRD- On HD MWF. Next HD 01/13/2020 6. Hypotension/volume- chronic hypotension on midodrine.HD 08/06. Wt today 67.8 kg. Close to OP EDW.  7. Anemiaof CKD- last Hgb9.6.Start ESA with HD Monday.  8. Secondary Hyperparathyroidism -C Ca 9.3,PO4 1.4 08/06. Stop binders. Recheck Monday. ContinueVDRA.  9. Nutrition- Renal diet w/fluid restrictions 10. Hx lung transplant/On immunosuppression- on azathioprine, cyclosporin, prednisone. Per primary 11. DMT2 -per primary 12. GERD - Hx bleeding duodenal ulcer  Meredeth Furber H. Jawaan Adachi NP-C 01/11/2020, 10:22 AM  [employer] [phone]

## 2020-01-11 NOTE — Evaluation (Signed)
Physical Therapy Evaluation Patient Details Name: [name] MRN: [id number] DOB: December 27, 1950 Today's Date: 01/11/2020   History of Present Illness  Pt adm with malaise and weakness. Pt with SIRS with no clear source of active infection. PMH - ESRD on HD, lung transplant, dm, gi bleed, orthostatic hypotension  Clinical Impression  Pt presents to PT with slightly unsteady gait due to illness and inactivity. Expect pt will make good progress back to baseline with mobility. Will follow acutely but doubt pt will need PT after DC.      Follow Up Recommendations No PT follow up;Supervision - Intermittent    Equipment Recommendations  None recommended by PT    Recommendations for Other Services       Precautions / Restrictions        Mobility  Bed Mobility Overal bed mobility: Needs Assistance Bed Mobility: Supine to Sit     Supine to sit: Supervision;HOB elevated     General bed mobility comments: assist for lines and incr time to perform  Transfers Overall transfer level: Needs assistance Equipment used: None Transfers: Sit to/from Stand Sit to Stand: Min guard;Min assist         General transfer comment: Min guard from bed for safety and lines. Min assist to bring hips up fom low commode  Ambulation/Gait Ambulation/Gait assistance: Supervision Gait Distance (Feet): 100 Feet (x 2) Assistive device: 4-wheeled walker Gait Pattern/deviations: Step-through pattern;Decreased stride length Gait velocity: decr Gait velocity interpretation: 1.31 - 2.62 ft/sec, indicative of limited community ambulator General Gait Details: Assist for lines and safety. 1 seated rest break  Stairs            Wheelchair Mobility    Modified Rankin (Stroke Patients Only)       Balance Overall balance assessment: Needs assistance Sitting-balance support: No upper extremity supported;Feet supported Sitting balance-Leahy Scale: Good     Standing balance support: No upper  extremity supported;During functional activity Standing balance-Leahy Scale: Fair                               Pertinent Vitals/Pain Pain Assessment: No/denies pain    Home Living Family/patient expects to be discharged to:: Private residence Living Arrangements: Spouse/significant other Available Help at Discharge: Family;Available 24 hours/day Type of Home: House Home Access: Stairs to enter Entrance Stairs-Rails: Right Entrance Stairs-Number of Steps: 3 Home Layout: One level Home Equipment: Walker - 4 wheels      Prior Function Level of Independence: Independent               Hand Dominance        Extremity/Trunk Assessment   Upper Extremity Assessment Upper Extremity Assessment: Generalized weakness    Lower Extremity Assessment Lower Extremity Assessment: Generalized weakness       Communication   Communication: No difficulties  Cognition Arousal/Alertness: Awake/alert Behavior During Therapy: WFL for tasks assessed/performed Overall Cognitive Status: Within Functional Limits for tasks assessed                                        General Comments General comments (skin integrity, edema, etc.): SpO2 100% on 2L. Removed O2. SpO2 >92% on RA with amb. Left O2 off.    Exercises     Assessment/Plan    PT Assessment Patient needs continued PT services  PT Problem List Decreased strength;Decreased  activity tolerance;Decreased balance;Decreased mobility       PT Treatment Interventions DME instruction;Gait training;Functional mobility training;Therapeutic activities;Therapeutic exercise;Balance training;Patient/family education    PT Goals (Current goals can be found in the Care Plan section)  Acute Rehab PT Goals Patient Stated Goal: return home PT Goal Formulation: With patient Time For Goal Achievement: 01/25/20 Potential to Achieve Goals: Good    Frequency Min 3X/week   Barriers to discharge         Co-evaluation               AM-PAC PT "6 Clicks" Mobility  Outcome Measure Help needed turning from your back to your side while in a flat bed without using bedrails?: None Help needed moving from lying on your back to sitting on the side of a flat bed without using bedrails?: None Help needed moving to and from a bed to a chair (including a wheelchair)?: None Help needed standing up from a chair using your arms (e.g., wheelchair or bedside chair)?: A Little Help needed to walk in hospital room?: A Little Help needed climbing 3-5 steps with a railing? : A Little 6 Click Score: 21    End of Session Equipment Utilized During Treatment: Gait belt Activity Tolerance: Patient limited by fatigue Patient left: in bed;with call bell/phone within reach (sitting EOB)   PT Visit Diagnosis: Other abnormalities of gait and mobility (R26.89)    Time: 9357-0177 PT Time Calculation (min) (ACUTE ONLY): 45 min   Charges:   PT Evaluation $PT Eval Moderate Complexity: 1 Mod PT Treatments $Gait Training: 23-37 mins        [place] Pager [phone] [place] 01/11/2020, 3:08 PM

## 2020-01-11 NOTE — Progress Notes (Signed)
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 Work up thus far unrevealing. Sars Covid 19 and viral resp panel negative.  No evidence of diverticulitis on CT scan.  CTA chest negative for PE.  No clear evidence of pulmonary infiltrates.  Started on IV abx empirically in the ED. Blood cx negative to date.  States was having diarrhea prior to admission and had watery stools this AM. GI panel negative.  c-diff pcr, antigen and toxin positive.  Started on C-diff directed treatment.  Enteric precautions.  01/11/20: Diarrhea improving but still very weak.  Updated his spouse via phone.  All questions answered.   Assessment/Plan: Principal Problem:   Sepsis with acute organ dysfunction (HCC) Active Problems:   GERD without esophagitis   Essential hypertension   ILD (interstitial lung disease) ([place])   Acute respiratory failure with hypoxia (HCC)   End-stage renal disease on hemodialysis (HCC)   Lactic acidosis   Mixed diabetic hyperlipidemia associated with type 2 diabetes mellitus ([place])   Hepatic cirrhosis ([place])   Uncontrolled type 2 diabetes mellitus with hyperglycemia, with long-term current use of insulin (HCC)   IV infiltrate, initial encounter   Acute diarrhea   Acute metabolic encephalopathy  Sepsis secondary to severe C. difficile infection with diarrhea  Presented with diarrhea and sirs criteria including fever with T-max 101.7, substantial  leukocytosis WBC 15 K, tachycardia heart rate 101 in the setting of immunosuppression and ESRD on HD MWF.    GI panel, C. difficile PCR, antigen and toxin positive  Started on C-diff directed treatment for severe C. difficile.  Enteric precautions.  Continue p.o. vancomycin and IV Flagyl, day #2.  Patient is immunocompromised due to known history of R lung transplant on immunosuppressive therapy including azathioprine, cyclosporine and prednisone.   Chest x-ray reveals no obvious evidence of new infiltrate however there may be a brewing pneumonia in the patient's left lung which is obscured by chronic infiltrates on chest x-ray.  Additionally, patient is complaining of a 1 day history of watery diarrhea so a concurrent infectious colitis is also possible.  Case was discussed with covering transplant physician at [facility] by the emergency department provider.  This was Dr. Robley Fries covering for Dr. Wynetta Emery.  They recommended obtaining CT angiogram of the chest as well as CT imaging of the abdomen to identify for any evidence of pulmonary emboli or sources of infection.  It is also recommended that the patient undergo a respiratory viral panel including testing for RSV  COVID-19 PCR testing negative, acute respiratory panel negative, GI panel negative.  Received broad-spectrum intravenous antibiotic therapy with cefepime, metronidazole and IV vancomycin, dced on 8/6  Blood cultures have been obtained 8/5.  Continue to follow cultures.  Negative to date.  Situational nausea with vomiting States he vomited once this morning after taking oral vancomycin Will give IV antiemetics prior to administering p.o. vancomycin.  Physical debility in the setting of severe C. difficile infection PT to assess Encourage increase in oral protein calorie intake Continue PT with assistance and fall precautions   Resolved acute  respiratory failure with hypoxia ([place]) Suspect secondary to atelectasis  Patient  presenting with new onset hypoxia.    Now on room air with O2 saturation in the mid 90s.    Continue incentive spirometer.      Resolved acute metabolic encephalopathy Currently back to his baseline mentation, alert and oriented x4.  Lethargic on presentation.    Conversational, but still weak appearing, states he feels better this morning    End-stage renal disease on hemodialysis MWF(HCC) Nephrology following Was hemodialyzed on 8/6  History of hypovolemia hypotension On midodrine prior to admission 10 mg prior to hemodialysis Monday Wednesday Friday. Increased dose of midodrine 10 mg 3 times daily. Maintain MAP greater than 65 Closely monitor vital signs.    Hepatic cirrhosis ([place])  Possible early cirrhosis on imaging  Monitor LFTs on current immunosuppressants  patient's transplant provider Dr. Wynetta Emery at [facility].  Patient denies alcohol use    Uncontrolled type 2 diabetes mellitus with hyperglycemia, likely contributed by chronic steroid use in the setting of R lung transplant. Hemoglobin A1c 5.8 on 01/08/20 Continue insulin sliding scale    IV infiltrate, initial encounter   Patient unfortunately experienced a right upper extremity small volume infiltration of contrast upon first attempt of performing contrast CTs in the emergency department.  Patient was immediately evaluated by Dr. Gerilyn Nestle with radiology who placed extravasation protocol orders and recommended conservative management.    GERD without esophagitis   Continue home regimen of PPI  Of note, patient has known history of gastrointestinal bleeding secondary to bleeding duodenal ulcer.    Code Status:  Full code Family Communication:  Updated his wife via phone.    Consultants:  Nephrology  Interventional radiology  Procedures:  Contrast extravasation  Antimicrobials:  IV vancomycin  Cefepime  IV Flagyl  DVT prophylaxis: Subcu heparin 3 times daily  Status is:  Inpatient    Dispo:  Patient From: Home  Planned Disposition: Home with Health Care Svc  Expected discharge date: 01/14/20  Medically stable for discharge: No, ongoing tx for severe c-diff infection        Objective: Vitals:   01/10/20 2344 01/11/20 0422 01/11/20 0428 01/11/20 1135  BP: 109/60 (!) 98/59  123/66  Pulse: 66 64  68  Resp: 18 18  14   Temp: 97.6 F (36.4 C) (!) 97.5 F (36.4 C)  (!) 97.4 F (36.3 C)  TempSrc: Oral Oral  Oral  SpO2: 100% 100%  100%  Weight:   67.8 kg   Height:        Intake/Output Summary (Last 24 hours) at 01/11/2020 1158 Last data filed at 01/11/2020 3474 Gross per 24 hour  Intake 420.03 ml  Output --  Net 420.03 ml   Filed Weights   01/10/20 0242 01/10/20 1159 01/11/20 0428  Weight: 69.4 kg 69.4 kg 67.8 kg    Exam:  . General: 69 y.o. year-old male chronically ill-appearing in no acute distress.  Alert and oriented x3.   . Cardiovascular: Regular rate and rhythm no rubs or gallops.   Marland Kitchen Respiratory: Clear to auscultation no wheezes or rales.   . Abdomen: Soft nontender normal bowel sounds present.   . Musculoskeletal: No lower extremity edema bilaterally.   Marland Kitchen Psychiatry: Mood is appropriate for condition and setting.  Data Reviewed: CBC: Recent Labs  Lab 01/07/20 2011 01/08/20 1059 01/09/20 0208 01/10/20 1355  WBC 15.2* 11.8* 9.5 7.6  NEUTROABS 14.7* 10.2* 8.5* 5.6  HGB 10.1* 9.5* 10.4* 9.6*  HCT 31.5* 30.4* 32.0* 30.5*  MCV 107.9* 108.2* 105.6* 106.3*  PLT 156 125* 145* 188*   Basic Metabolic Panel: Recent Labs  Lab 01/07/20 2011 01/08/20 1059 01/09/20 0208 01/09/20 1049 01/10/20 1355  NA 137 134* 136  --  135  K 3.7 3.7 3.8  --  3.8  CL 93* 94* 98  --  97*  CO2 28 27 26   --  25  GLUCOSE 197* 103* 127*  --  112*  BUN 44* 52* 20  --  14  CREATININE 8.78* 9.50* 4.72*  --  3.43*  CALCIUM 8.7* 8.1* 8.3*  --  8.2*  MG  --  2.1 1.9  --   --   PHOS  --   --   --  2.6 1.4*   GFR: Estimated Creatinine Clearance:  16.1 mL/min (A) (by C-G formula based on SCr of 3.43 mg/dL (H)). Liver Function Tests: Recent Labs  Lab 01/07/20 2011 01/08/20 1059 01/09/20 0208 01/10/20 1355  AST 47* 34 43* 37  ALT 25 22 22 23   ALKPHOS 235* 212* 211* 232*  BILITOT 3.5* 2.7* 2.1* 1.5*  PROT 6.0* 5.5* 5.7* 5.9*  ALBUMIN 2.7* 2.4* 2.5* 2.5*   No results for input(s): LIPASE, AMYLASE in the last 168 hours. No results for input(s): AMMONIA in the last 168 hours. Coagulation Profile: Recent Labs  Lab 01/07/20 2011  INR 1.1   Cardiac Enzymes: No results for input(s): CKTOTAL, CKMB, CKMBINDEX, TROPONINI in the last 168 hours. BNP (last 3 results) No results for input(s): PROBNP in the last 8760 hours. HbA1C: No results for input(s): HGBA1C in the last 72 hours. CBG: Recent Labs  Lab 01/10/20 0607 01/10/20 1137 01/10/20 1727 01/10/20 2212 01/11/20 0621  GLUCAP 100* 110* 102* 143* 107*   Lipid Profile: No results for input(s): CHOL, HDL, LDLCALC, TRIG, CHOLHDL, LDLDIRECT in the last 72 hours. Thyroid Function Tests: No results for input(s): TSH, T4TOTAL, FREET4, T3FREE, THYROIDAB in the last 72 hours. Anemia Panel: No results for input(s): VITAMINB12, FOLATE, FERRITIN, TIBC, IRON, RETICCTPCT in the last 72 hours. Urine analysis:    Component Value Date/Time   COLORURINE AMBER (A) 12/31/2015 0515   APPEARANCEUR CLOUDY (A) 12/31/2015 0515   LABSPEC 1.016 12/31/2015 0515   PHURINE 7.0 12/31/2015 0515   GLUCOSEU NEGATIVE 12/31/2015 0515   HGBUR NEGATIVE 12/31/2015 0515   BILIRUBINUR SMALL (A) 12/31/2015 0515   KETONESUR 15 (A) 12/31/2015 0515   PROTEINUR 100 (A) 12/31/2015 0515   UROBILINOGEN 2.0 (H) 08/15/2013 1916   NITRITE NEGATIVE 12/31/2015 0515   LEUKOCYTESUR NEGATIVE 12/31/2015 0515   Sepsis Labs: @LABRCNTIP (procalcitonin:4,lacticidven:4)  ) Recent Results (from the past 240 hour(s))  Culture, blood (Routine x 2)     Status: None (Preliminary result)   Collection Time: 01/07/20  7:15 PM     Specimen: BLOOD RIGHT ARM  Result Value Ref Range Status   Specimen Description BLOOD RIGHT ARM  Final   Special Requests   Final    BOTTLES DRAWN AEROBIC AND ANAEROBIC Blood Culture results may not be optimal due to an excessive volume of blood received in culture bottles   Culture   Final    NO GROWTH 4 DAYS Performed at [facility], [place] [address] [place], [place] [address]    Report Status PENDING  Incomplete  Culture, blood (Routine x 2)     Status: None (Preliminary result)   Collection Time: 01/07/20  7:30 PM   Specimen: BLOOD RIGHT HAND  Result Value Ref Range Status   Specimen Description  BLOOD RIGHT HAND  Final   Special Requests   Final    BOTTLES DRAWN AEROBIC ONLY Blood Culture adequate volume   Culture   Final    NO GROWTH 4 DAYS Performed at [facility], [address] [place], [place] [address]    Report Status PENDING  Incomplete  SARS Coronavirus 2 by RT PCR (hospital order, performed in [facility] hospital lab) Nasopharyngeal Nasopharyngeal Swab     Status: None   Collection Time: 01/07/20  7:43 PM   Specimen: Nasopharyngeal Swab  Result Value Ref Range Status   SARS Coronavirus 2 NEGATIVE NEGATIVE Final    Comment: (NOTE) SARS-CoV-2 target nucleic acids are NOT DETECTED.  The SARS-CoV-2 RNA is generally detectable in upper and lower respiratory specimens during the acute phase of infection. The lowest concentration of SARS-CoV-2 viral copies this assay can detect is 250 copies / mL. A negative result does not preclude SARS-CoV-2 infection and should not be used as the sole basis for treatment or other patient management decisions.  A negative result may occur with improper specimen collection / handling, submission of specimen other than nasopharyngeal swab, presence of viral mutation(s) within the areas targeted by this assay, and inadequate number of viral copies (<250 copies / mL). A negative result must be combined with  clinical observations, patient history, and epidemiological information.  Fact Sheet for Patients:   StrictlyIdeas.no  Fact Sheet for Healthcare Providers: BankingDealers.co.za  This test is not yet approved or  cleared by the [place] FDA and has been authorized for detection and/or diagnosis of SARS-CoV-2 by FDA under an Emergency Use Authorization (EUA).  This EUA will remain in effect (meaning this test can be used) for the duration of the COVID-19 declaration under Section 564(b)(1) of the Act, 21 U.S.C. section 360bbb-3(b)(1), unless the authorization is terminated or revoked sooner.  Performed at [facility], [place] [address], [place], [place] [address]   Resp Panel by RT PCR (RSV, Flu A&B, Covid) - Nasopharyngeal Swab     Status: None   Collection Time: 01/08/20  7:16 AM   Specimen: Nasopharyngeal Swab  Result Value Ref Range Status   SARS Coronavirus 2 by RT PCR NEGATIVE NEGATIVE Final    Comment: (NOTE) SARS-CoV-2 target nucleic acids are NOT DETECTED.  The SARS-CoV-2 RNA is generally detectable in upper respiratoy specimens during the acute phase of infection. The lowest concentration of SARS-CoV-2 viral copies this assay can detect is 131 copies/mL. A negative result does not preclude SARS-Cov-2 infection and should not be used as the sole basis for treatment or other patient management decisions. A negative result may occur with  improper specimen collection/handling, submission of specimen other than nasopharyngeal swab, presence of viral mutation(s) within the areas targeted by this assay, and inadequate number of viral copies (<131 copies/mL). A negative result must be combined with clinical observations, patient history, and epidemiological information. The expected result is Negative.  Fact Sheet for Patients:  PinkCheek.be  Fact Sheet for Healthcare Providers:   GravelBags.it  This test is no t yet approved or cleared by the [place] FDA and  has been authorized for detection and/or diagnosis of SARS-CoV-2 by FDA under an Emergency Use Authorization (EUA). This EUA will remain  in effect (meaning this test can be used) for the duration of the COVID-19 declaration under Section 564(b)(1) of the Act, 21 U.S.C. section 360bbb-3(b)(1), unless the authorization is terminated or revoked sooner.     Influenza A  by PCR NEGATIVE NEGATIVE Final   Influenza B by PCR NEGATIVE NEGATIVE Final    Comment: (NOTE) The Xpert Xpress SARS-CoV-2/FLU/RSV assay is intended as an aid in  the diagnosis of influenza from Nasopharyngeal swab specimens and  should not be used as a sole basis for treatment. Nasal washings and  aspirates are unacceptable for Xpert Xpress SARS-CoV-2/FLU/RSV  testing.  Fact Sheet for Patients: PinkCheek.be  Fact Sheet for Healthcare Providers: GravelBags.it  This test is not yet approved or cleared by the [place] FDA and  has been authorized for detection and/or diagnosis of SARS-CoV-2 by  FDA under an Emergency Use Authorization (EUA). This EUA will remain  in effect (meaning this test can be used) for the duration of the  Covid-19 declaration under Section 564(b)(1) of the Act, 21  U.S.C. section 360bbb-3(b)(1), unless the authorization is  terminated or revoked.    Respiratory Syncytial Virus by PCR NEGATIVE NEGATIVE Final    Comment: (NOTE) Fact Sheet for Patients: PinkCheek.be  Fact Sheet for Healthcare Providers: GravelBags.it  This test is not yet approved or cleared by the [place] FDA and  has been authorized for detection and/or diagnosis of SARS-CoV-2 by  FDA under an Emergency Use Authorization (EUA). This EUA will remain  in effect (meaning this test can be  used) for the duration of the  COVID-19 declaration under Section 564(b)(1) of the Act, 21 U.S.C.  section 360bbb-3(b)(1), unless the authorization is terminated or  revoked. Performed at [facility], [place] [address] [place], [place] [address]   Gastrointestinal Panel by PCR , Stool     Status: None   Collection Time: 01/08/20  7:16 AM   Specimen: Stool  Result Value Ref Range Status   Campylobacter species NOT DETECTED NOT DETECTED Final   Plesimonas shigelloides NOT DETECTED NOT DETECTED Final   Salmonella species NOT DETECTED NOT DETECTED Final   Yersinia enterocolitica NOT DETECTED NOT DETECTED Final   Vibrio species NOT DETECTED NOT DETECTED Final   Vibrio cholerae NOT DETECTED NOT DETECTED Final   Enteroaggregative E coli (EAEC) NOT DETECTED NOT DETECTED Final   Enteropathogenic E coli (EPEC) NOT DETECTED NOT DETECTED Final   Enterotoxigenic E coli (ETEC) NOT DETECTED NOT DETECTED Final   Shiga like toxin producing E coli (STEC) NOT DETECTED NOT DETECTED Final   Shigella/Enteroinvasive E coli (EIEC) NOT DETECTED NOT DETECTED Final   Cryptosporidium NOT DETECTED NOT DETECTED Final   Cyclospora cayetanensis NOT DETECTED NOT DETECTED Final   Entamoeba histolytica NOT DETECTED NOT DETECTED Final   Giardia lamblia NOT DETECTED NOT DETECTED Final   Adenovirus F40/41 NOT DETECTED NOT DETECTED Final   Astrovirus NOT DETECTED NOT DETECTED Final   Norovirus GI/GII NOT DETECTED NOT DETECTED Final   Rotavirus A NOT DETECTED NOT DETECTED Final   Sapovirus (I, II, IV, and V) NOT DETECTED NOT DETECTED Final    Comment: Performed at [facility], [address] [place], [place] [address]  MRSA PCR Screening     Status: None   Collection Time: 01/09/20  8:20 AM   Specimen: Nasal Mucosa; Nasopharyngeal  Result Value Ref Range Status   MRSA by PCR NEGATIVE NEGATIVE Final    Comment:        The GeneXpert MRSA Assay (FDA approved for NASAL specimens only), is one component  of a comprehensive MRSA colonization surveillance program. It is not intended to diagnose MRSA infection nor to guide or monitor treatment for MRSA infections. Performed at [facility], [place] [address] [place], [place] [address]   C Difficile Quick Screen w PCR reflex     Status: Abnormal   Collection Time: 01/09/20 12:23 PM   Specimen: STOOL  Result Value Ref Range Status   C Diff antigen POSITIVE (A) NEGATIVE Final   C Diff toxin POSITIVE (A) NEGATIVE Final   C Diff interpretation Toxin producing C. difficile detected.  Final    Comment: CRITICAL RESULT CALLED TO, READ BACK BY AND VERIFIED WITH: Patrcia Dolly RN 1610 01/09/20 A BROWNING Performed at [facility], [place] [address] [place], [place] [address]       Studies: No results found.  Scheduled Meds: . aspirin  81 mg Oral Daily  . azaTHIOprine  50 mg Oral Q M,W,F-HD  . Chlorhexidine Gluconate Cloth  6 each Topical Q0600  . cycloSPORINE  100 mg Oral QHS  . cycloSPORINE  75 mg Oral Daily  . [START ON 01/13/2020] darbepoetin (ARANESP) injection - DIALYSIS  25 mcg Intravenous Q Mon-HD  . dorzolamide-timolol  1 drop Both Eyes BID  . fluticasone  2 spray Each Nare Daily  . heparin  5,000 Units Subcutaneous Q8H  . insulin aspart  0-9 Units Subcutaneous TID AC & HS  . insulin aspart  5 Units Subcutaneous QAC breakfast  . insulin aspart  6 Units Subcutaneous QAC lunch  . insulin aspart  6 Units Subcutaneous QAC supper  . midodrine  10 mg Oral TID WC  . multivitamin with minerals  1 tablet Oral Q T,Th,S,Su  . pantoprazole  40 mg Oral BID  . prednisoLONE acetate  1 drop Both Eyes BID  . predniSONE  5 mg Oral Q breakfast  . pyridOXINE  100 mg Oral Daily  . sulfamethoxazole-trimethoprim  1 tablet Oral Q M,W,F-2000  . valGANciclovir  450 mg Oral Once per day on Mon Fri  . vancomycin  500 mg Oral Q6H    Continuous Infusions: . metronidazole 500 mg (01/11/20 0634)     LOS: 3 days     Kayleen Memos, MD [facility] Pager [phone]  If 7PM-7AM, please contact night-coverage [email or web address] Password [email or web address] 01/11/2020, 11:58 AM

## 2020-01-12 LAB — GLUCOSE, CAPILLARY
Glucose-Capillary: 127 mg/dL — ABNORMAL HIGH (ref 70–99)
Glucose-Capillary: 182 mg/dL — ABNORMAL HIGH (ref 70–99)
Glucose-Capillary: 155 mg/dL — ABNORMAL HIGH (ref 70–99)

## 2020-01-12 LAB — CULTURE, BLOOD (ROUTINE X 2)
Culture: NO GROWTH
Culture: NO GROWTH
Special Requests: ADEQUATE

## 2020-01-12 NOTE — Progress Notes (Signed)
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 Work up thus far unrevealing. Sars Covid 19 and viral resp panel negative.  No evidence of diverticulitis on CT scan.  CTA chest negative for PE.  No clear evidence of pulmonary infiltrates.  Started on IV abx empirically in the ED. Blood cx negative to date.  States was having diarrhea prior to admission and had watery stools this AM. GI panel negative.  c-diff pcr, antigen and toxin positive.  Started on C-diff directed treatment.  Enteric precautions.  01/12/20: Diarrhea improving.  States he had one mushy stool with this bowel movement this morning.  Denies any significant abdominal pain or nausea.  Will obtain labs at hemodialysis tomorrow.   Assessment/Plan: Principal Problem:   Sepsis with acute organ dysfunction (HCC) Active Problems:   GERD without esophagitis   Essential hypertension   ILD (interstitial lung disease) ([place])   Acute respiratory failure with hypoxia (HCC)   End-stage renal disease on hemodialysis (HCC)   Lactic acidosis   Mixed diabetic hyperlipidemia associated with type 2 diabetes mellitus ([place])   Hepatic cirrhosis ([place])   Uncontrolled type 2 diabetes mellitus with hyperglycemia, with long-term current use of insulin (HCC)   IV infiltrate, initial encounter   Acute diarrhea   Acute metabolic encephalopathy  Sepsis secondary to severe C. difficile infection with  diarrhea  Presented with diarrhea and sirs criteria including fever with T-max 101.7, substantial leukocytosis WBC 15 K, tachycardia heart rate 101 in the setting of immunosuppression and ESRD on HD MWF.    GI panel, C. difficile PCR, antigen and toxin positive  Started on C-diff directed treatment for severe C. difficile.  Enteric precautions.  Continue p.o. vancomycin and IV Flagyl, day #3.  Patient is immunocompromised due to known history of R lung transplant on immunosuppressive therapy including azathioprine, cyclosporine and prednisone.   Chest x-ray reveals no obvious evidence of new infiltrate however there may be a brewing pneumonia in the patient's left lung which is obscured by chronic infiltrates on chest x-ray.  Additionally, patient is complaining of a 1 day history of watery diarrhea so a concurrent infectious colitis is also possible.  Case was discussed with covering transplant physician at [facility] by the emergency department provider.  This was Dr. Robley Fries covering for Dr. Wynetta Emery.  They recommended obtaining CT angiogram of the chest as well as CT imaging of the abdomen to identify for any evidence of pulmonary emboli or sources of infection.  It is also recommended that the patient undergo a respiratory viral panel including testing for RSV  COVID-19 PCR testing negative, acute respiratory panel negative, GI panel negative.  Received broad-spectrum intravenous antibiotic therapy with cefepime, metronidazole and IV vancomycin, dced on 8/6  Blood cultures have been obtained 8/5.  Continue to follow cultures.  Negative to date.  Obtain labs at hemodialysis tomorrow.  Resolved situational nausea with vomiting States he vomited once this morning after taking oral vancomycin Will give IV antiemetics prior to administering p.o. vancomycin.  Physical debility in the setting of severe C. difficile infection  PT to assess Encourage increase in oral protein calorie  intake Continue PT with assistance and fall precautions   Resolved acute respiratory failure with hypoxia ([place]) Suspect secondary to atelectasis  Patient presenting with new onset hypoxia.    Now on room air with O2 saturation in the mid 90s.    Continue incentive spirometer.      Resolved acute metabolic encephalopathy Currently back to his baseline mentation, alert and oriented x4.  Lethargic on presentation.    Conversational, but still weak appearing, states he feels better this morning    End-stage renal disease on hemodialysis MWF(HCC) Nephrology following Was hemodialyzed on 8/6  History of hypovolemia hypotension On midodrine prior to admission 10 mg prior to hemodialysis Monday Wednesday Friday. Increased dose of midodrine 10 mg 3 times daily. Maintain MAP greater than 65 Closely monitor vital signs.    Hepatic cirrhosis ([place])  Possible early cirrhosis on imaging  Monitor LFTs on current immunosuppressants  patient's transplant provider Dr. Wynetta Emery at [facility].  Patient denies alcohol use    Uncontrolled type 2 diabetes mellitus with hyperglycemia, likely contributed by chronic steroid use in the setting of R lung transplant. Hemoglobin A1c 5.8 on 01/08/20 Continue insulin sliding scale    IV infiltrate, initial encounter   Patient unfortunately experienced a right upper extremity small volume infiltration of contrast upon first attempt of performing contrast CTs in the emergency department.  Patient was immediately evaluated by Dr. Gerilyn Nestle with radiology who placed extravasation protocol orders and recommended conservative management.    GERD without esophagitis   Continue home regimen of PPI  Of note, patient has known history of gastrointestinal bleeding secondary to bleeding duodenal ulcer.    Code Status:  Full code Family Communication:  Updated his wife via phone.    Consultants:  Nephrology  Interventional  radiology  Procedures:  Contrast extravasation  Antimicrobials:  IV vancomycin  Cefepime  IV Flagyl  DVT prophylaxis: Subcu heparin 3 times daily  Status is: Inpatient    Dispo:  Patient From: Home  Planned Disposition: Home with Health Care Svc  Expected discharge date: 01/14/20  Medically stable for discharge: No, ongoing tx for severe c-diff infection        Objective: Vitals:   01/12/20 0005 01/12/20 0332 01/12/20 0800 01/12/20 1132  BP: (!) 101/55 136/76 123/71 (!) 102/57  Pulse: 66 65 70 71  Resp: 17 13 15 17   Temp: 97.6 F (36.4 C) 97.6 F (36.4 C) (!) 97.4 F (36.3 C) (!) 97.5 F (36.4 C)  TempSrc: Oral Oral Oral Oral  SpO2: 94% 98% 93% 98%  Weight:  68.1 kg    Height:        Intake/Output Summary (Last 24 hours) at 01/12/2020 1403 Last data filed at 01/12/2020 0855 Gross per 24 hour  Intake 432.56 ml  Output --  Net 432.56 ml   Filed Weights   01/10/20 1159 01/11/20 0428 01/12/20 0332  Weight: 69.4 kg 67.8 kg 68.1 kg    Exam:  . General: 69 y.o. year-old male chronically ill-appearing no acute distress.  Alert and oriented x3.   . Cardiovascular: Regular rate and rhythm no rubs or gallops. Marland Kitchen Respiratory: Clear to auscultation no wheezes or rales.   . Abdomen: Soft nontender normal bowel sounds present.   . Musculoskeletal: No lower extremity edema bilaterally. Marland Kitchen Psychiatry: Mood is appropriate for condition and setting.    Data Reviewed: CBC: Recent Labs  Lab 01/07/20 2011 01/08/20 1059 01/09/20 0208 01/10/20 1355  WBC  15.2* 11.8* 9.5 7.6  NEUTROABS 14.7* 10.2* 8.5* 5.6  HGB 10.1* 9.5* 10.4* 9.6*  HCT 31.5* 30.4* 32.0* 30.5*  MCV 107.9* 108.2* 105.6* 106.3*  PLT 156 125* 145* 433*   Basic Metabolic Panel: Recent Labs  Lab 01/07/20 2011 01/08/20 1059 01/09/20 0208 01/09/20 1049 01/10/20 1355  NA 137 134* 136  --  135  K 3.7 3.7 3.8  --  3.8  CL 93* 94* 98  --  97*  CO2 28 27 26   --  25  GLUCOSE 197* 103* 127*  --   112*  BUN 44* 52* 20  --  14  CREATININE 8.78* 9.50* 4.72*  --  3.43*  CALCIUM 8.7* 8.1* 8.3*  --  8.2*  MG  --  2.1 1.9  --   --   PHOS  --   --   --  2.6 1.4*   GFR: Estimated Creatinine Clearance: 16.1 mL/min (A) (by C-G formula based on SCr of 3.43 mg/dL (H)). Liver Function Tests: Recent Labs  Lab 01/07/20 2011 01/08/20 1059 01/09/20 0208 01/10/20 1355  AST 47* 34 43* 37  ALT 25 22 22 23   ALKPHOS 235* 212* 211* 232*  BILITOT 3.5* 2.7* 2.1* 1.5*  PROT 6.0* 5.5* 5.7* 5.9*  ALBUMIN 2.7* 2.4* 2.5* 2.5*   No results for input(s): LIPASE, AMYLASE in the last 168 hours. No results for input(s): AMMONIA in the last 168 hours. Coagulation Profile: Recent Labs  Lab 01/07/20 2011  INR 1.1   Cardiac Enzymes: No results for input(s): CKTOTAL, CKMB, CKMBINDEX, TROPONINI in the last 168 hours. BNP (last 3 results) No results for input(s): PROBNP in the last 8760 hours. HbA1C: No results for input(s): HGBA1C in the last 72 hours. CBG: Recent Labs  Lab 01/11/20 1210 01/11/20 1553 01/11/20 2213 01/12/20 0623 01/12/20 1127  GLUCAP 133* 133* 132* 127* 182*   Lipid Profile: No results for input(s): CHOL, HDL, LDLCALC, TRIG, CHOLHDL, LDLDIRECT in the last 72 hours. Thyroid Function Tests: No results for input(s): TSH, T4TOTAL, FREET4, T3FREE, THYROIDAB in the last 72 hours. Anemia Panel: No results for input(s): VITAMINB12, FOLATE, FERRITIN, TIBC, IRON, RETICCTPCT in the last 72 hours. Urine analysis:    Component Value Date/Time   COLORURINE AMBER (A) 12/31/2015 0515   APPEARANCEUR CLOUDY (A) 12/31/2015 0515   LABSPEC 1.016 12/31/2015 0515   PHURINE 7.0 12/31/2015 0515   GLUCOSEU NEGATIVE 12/31/2015 0515   HGBUR NEGATIVE 12/31/2015 0515   BILIRUBINUR SMALL (A) 12/31/2015 0515   KETONESUR 15 (A) 12/31/2015 0515   PROTEINUR 100 (A) 12/31/2015 0515   UROBILINOGEN 2.0 (H) 08/15/2013 1916   NITRITE NEGATIVE 12/31/2015 0515   LEUKOCYTESUR NEGATIVE 12/31/2015 0515    Sepsis Labs: @LABRCNTIP (procalcitonin:4,lacticidven:4)  ) Recent Results (from the past 240 hour(s))  Culture, blood (Routine x 2)     Status: None   Collection Time: 01/07/20  7:15 PM   Specimen: BLOOD RIGHT ARM  Result Value Ref Range Status   Specimen Description BLOOD RIGHT ARM  Final   Special Requests   Final    BOTTLES DRAWN AEROBIC AND ANAEROBIC Blood Culture results may not be optimal due to an excessive volume of blood received in culture bottles   Culture   Final    NO GROWTH 5 DAYS Performed at [facility], [place] [address], [place], [place] [address]    Report Status 01/12/2020 FINAL  Final  Culture, blood (Routine x 2)     Status: None   Collection Time: 01/07/20  7:30 PM   Specimen: BLOOD RIGHT HAND  Result Value Ref Range Status   Specimen Description BLOOD RIGHT HAND  Final   Special Requests   Final    BOTTLES DRAWN AEROBIC ONLY Blood Culture adequate volume   Culture   Final    NO GROWTH 5 DAYS Performed at [facility], [address] [place], [place] [address]    Report Status 01/12/2020 FINAL  Final  SARS Coronavirus 2 by RT PCR (hospital order, performed in [facility] hospital lab) Nasopharyngeal Nasopharyngeal Swab     Status: None   Collection Time: 01/07/20  7:43 PM   Specimen: Nasopharyngeal Swab  Result Value Ref Range Status   SARS Coronavirus 2 NEGATIVE NEGATIVE Final    Comment: (NOTE) SARS-CoV-2 target nucleic acids are NOT DETECTED.  The SARS-CoV-2 RNA is generally detectable in upper and lower respiratory specimens during the acute phase of infection. The lowest concentration of SARS-CoV-2 viral copies this assay can detect is 250 copies / mL. A negative result does not preclude SARS-CoV-2 infection and should not be used as the sole basis for treatment or other patient management decisions.  A negative result may occur with improper specimen collection / handling, submission of specimen other than nasopharyngeal swab,  presence of viral mutation(s) within the areas targeted by this assay, and inadequate number of viral copies (<250 copies / mL). A negative result must be combined with clinical observations, patient history, and epidemiological information.  Fact Sheet for Patients:   StrictlyIdeas.no  Fact Sheet for Healthcare Providers: BankingDealers.co.za  This test is not yet approved or  cleared by the [place] FDA and has been authorized for detection and/or diagnosis of SARS-CoV-2 by FDA under an Emergency Use Authorization (EUA).  This EUA will remain in effect (meaning this test can be used) for the duration of the COVID-19 declaration under Section 564(b)(1) of the Act, 21 U.S.C. section 360bbb-3(b)(1), unless the authorization is terminated or revoked sooner.  Performed at [facility], [place] [address], [place], [place] [address]   Resp Panel by RT PCR (RSV, Flu A&B, Covid) - Nasopharyngeal Swab     Status: None   Collection Time: 01/08/20  7:16 AM   Specimen: Nasopharyngeal Swab  Result Value Ref Range Status   SARS Coronavirus 2 by RT PCR NEGATIVE NEGATIVE Final    Comment: (NOTE) SARS-CoV-2 target nucleic acids are NOT DETECTED.  The SARS-CoV-2 RNA is generally detectable in upper respiratoy specimens during the acute phase of infection. The lowest concentration of SARS-CoV-2 viral copies this assay can detect is 131 copies/mL. A negative result does not preclude SARS-Cov-2 infection and should not be used as the sole basis for treatment or other patient management decisions. A negative result may occur with  improper specimen collection/handling, submission of specimen other than nasopharyngeal swab, presence of viral mutation(s) within the areas targeted by this assay, and inadequate number of viral copies (<131 copies/mL). A negative result must be combined with clinical observations, patient history, and  epidemiological information. The expected result is Negative.  Fact Sheet for Patients:  PinkCheek.be  Fact Sheet for Healthcare Providers:  GravelBags.it  This test is no t yet approved or cleared by the [place] FDA and  has been authorized for detection and/or diagnosis of SARS-CoV-2 by FDA under an Emergency Use Authorization (EUA). This EUA will remain  in effect (meaning this test can be used) for the duration of the COVID-19 declaration under Section 564(b)(1) of the  Act, 21 U.S.C. section 360bbb-3(b)(1), unless the authorization is terminated or revoked sooner.     Influenza A by PCR NEGATIVE NEGATIVE Final   Influenza B by PCR NEGATIVE NEGATIVE Final    Comment: (NOTE) The Xpert Xpress SARS-CoV-2/FLU/RSV assay is intended as an aid in  the diagnosis of influenza from Nasopharyngeal swab specimens and  should not be used as a sole basis for treatment. Nasal washings and  aspirates are unacceptable for Xpert Xpress SARS-CoV-2/FLU/RSV  testing.  Fact Sheet for Patients: PinkCheek.be  Fact Sheet for Healthcare Providers: GravelBags.it  This test is not yet approved or cleared by the [place] FDA and  has been authorized for detection and/or diagnosis of SARS-CoV-2 by  FDA under an Emergency Use Authorization (EUA). This EUA will remain  in effect (meaning this test can be used) for the duration of the  Covid-19 declaration under Section 564(b)(1) of the Act, 21  U.S.C. section 360bbb-3(b)(1), unless the authorization is  terminated or revoked.    Respiratory Syncytial Virus by PCR NEGATIVE NEGATIVE Final    Comment: (NOTE) Fact Sheet for Patients: PinkCheek.be  Fact Sheet for Healthcare Providers: GravelBags.it  This test is not yet approved or cleared by the [place] FDA and   has been authorized for detection and/or diagnosis of SARS-CoV-2 by  FDA under an Emergency Use Authorization (EUA). This EUA will remain  in effect (meaning this test can be used) for the duration of the  COVID-19 declaration under Section 564(b)(1) of the Act, 21 U.S.C.  section 360bbb-3(b)(1), unless the authorization is terminated or  revoked. Performed at [facility], [place] [address], [place], [place] [address]   Gastrointestinal Panel by PCR , Stool     Status: None   Collection Time: 01/08/20  7:16 AM   Specimen: Stool  Result Value Ref Range Status   Campylobacter species NOT DETECTED NOT DETECTED Final   Plesimonas shigelloides NOT DETECTED NOT DETECTED Final   Salmonella species NOT DETECTED NOT DETECTED Final   Yersinia enterocolitica NOT DETECTED NOT DETECTED Final   Vibrio species NOT DETECTED NOT DETECTED Final   Vibrio cholerae NOT DETECTED NOT DETECTED Final   Enteroaggregative E coli (EAEC) NOT DETECTED NOT DETECTED Final   Enteropathogenic E coli (EPEC) NOT DETECTED NOT DETECTED Final   Enterotoxigenic E coli (ETEC) NOT DETECTED NOT DETECTED Final   Shiga like toxin producing E coli (STEC) NOT DETECTED NOT DETECTED Final   Shigella/Enteroinvasive E coli (EIEC) NOT DETECTED NOT DETECTED Final   Cryptosporidium NOT DETECTED NOT DETECTED Final   Cyclospora cayetanensis NOT DETECTED NOT DETECTED Final   Entamoeba histolytica NOT DETECTED NOT DETECTED Final   Giardia lamblia NOT DETECTED NOT DETECTED Final   Adenovirus F40/41 NOT DETECTED NOT DETECTED Final   Astrovirus NOT DETECTED NOT DETECTED Final   Norovirus GI/GII NOT DETECTED NOT DETECTED Final   Rotavirus A NOT DETECTED NOT DETECTED Final   Sapovirus (I, II, IV, and V) NOT DETECTED NOT DETECTED Final    Comment: Performed at [facility], [address] [place], [place] [address]  MRSA PCR Screening     Status: None   Collection Time: 01/09/20  8:20 AM   Specimen: Nasal Mucosa;  Nasopharyngeal  Result Value Ref Range Status   MRSA by PCR NEGATIVE NEGATIVE Final    Comment:        The GeneXpert MRSA Assay (FDA approved for NASAL specimens only), is one component of a comprehensive MRSA colonization surveillance program. It  is not intended to diagnose MRSA infection nor to guide or monitor treatment for MRSA infections. Performed at [facility], [place] [address] [place], [place] [address]   C Difficile Quick Screen w PCR reflex     Status: Abnormal   Collection Time: 01/09/20 12:23 PM   Specimen: STOOL  Result Value Ref Range Status   C Diff antigen POSITIVE (A) NEGATIVE Final   C Diff toxin POSITIVE (A) NEGATIVE Final   C Diff interpretation Toxin producing C. difficile detected.  Final    Comment: CRITICAL RESULT CALLED TO, READ BACK BY AND VERIFIED WITH: Patrcia Dolly RN [id number] 01/09/20 A BROWNING Performed at [facility], [place] [address], [place], [place] [address]       Studies: No results found.  Scheduled Meds: . aspirin  81 mg Oral Daily  . azaTHIOprine  50 mg Oral Q M,W,F-HD  . Chlorhexidine Gluconate Cloth  6 each Topical Q0600  . cycloSPORINE  100 mg Oral QHS  . cycloSPORINE  75 mg Oral Daily  . [START ON 01/13/2020] darbepoetin (ARANESP) injection - DIALYSIS  25 mcg Intravenous Q Mon-HD  . dorzolamide-timolol  1 drop Both Eyes BID  . fluticasone  2 spray Each Nare Daily  . heparin  5,000 Units Subcutaneous Q8H  . insulin aspart  0-9 Units Subcutaneous TID AC & HS  . insulin aspart  5 Units Subcutaneous QAC breakfast  . insulin aspart  6 Units Subcutaneous QAC lunch  . insulin aspart  6 Units Subcutaneous QAC supper  . midodrine  10 mg Oral TID WC  . multivitamin with minerals  1 tablet Oral Q T,Th,S,Su  . pantoprazole  40 mg Oral BID  . prednisoLONE acetate  1 drop Both Eyes BID  . predniSONE  5 mg Oral Q breakfast  . pyridOXINE  100 mg Oral Daily  . sulfamethoxazole-trimethoprim  1 tablet Oral Q M,W,F-2000  . valGANciclovir   450 mg Oral Once per day on Mon Fri  . vancomycin  500 mg Oral Q6H    Continuous Infusions: . metronidazole 500 mg (01/12/20 1302)     LOS: 4 days     Kayleen Memos, MD [facility] Pager [phone]  If 7PM-7AM, please contact night-coverage [email or web address] Password [email or web address] 01/12/2020, 2:03 PM

## 2020-01-12 NOTE — Progress Notes (Addendum)
[facility] Progress Note   Subjective: Up at sink doing his bath. Much more alert today, says he feels better. Upset that he has swelling in his feet otherwise no C/Os.   Objective Vitals:   01/11/20 1615 01/12/20 0005 01/12/20 0332 01/12/20 0800  BP: 139/72 (!) 101/55 136/76 123/71  Pulse: 64 66 65 70  Resp: 19 17 13 15   Temp: (!) 97.5 F (36.4 C) 97.6 F (36.4 C) 97.6 F (36.4 C) (!) 97.4 F (36.3 C)  TempSrc: Oral Oral Oral Oral  SpO2: 100% 94% 98% 93%  Weight:   68.1 kg   Height:       Physical Exam General: Chronically ill appearing male in NAD Heart: S1,S2 RRR Lungs: Bilateral breath sounds CTAB today. Slightly decreased in bases  Abdomen: Active BS. Extremities: Trace to 1+ pedal edema Dialysis Access: L AVF + bruit  Dialysis Orders:  Additional Objective Labs: Basic Metabolic Panel: Recent Labs  Lab 01/08/20 1059 01/09/20 0208 01/09/20 1049 01/10/20 1355  NA 134* 136  --  135  K 3.7 3.8  --  3.8  CL 94* 98  --  97*  CO2 27 26  --  25  GLUCOSE 103* 127*  --  112*  BUN 52* 20  --  14  CREATININE 9.50* 4.72*  --  3.43*  CALCIUM 8.1* 8.3*  --  8.2*  PHOS  --   --  2.6 1.4*   Liver Function Tests: Recent Labs  Lab 01/08/20 1059 01/09/20 0208 01/10/20 1355  AST 34 43* 37  ALT 22 22 23   ALKPHOS 212* 211* 232*  BILITOT 2.7* 2.1* 1.5*  PROT 5.5* 5.7* 5.9*  ALBUMIN 2.4* 2.5* 2.5*   No results for input(s): LIPASE, AMYLASE in the last 168 hours. CBC: Recent Labs  Lab 01/07/20 2011 01/07/20 2011 01/08/20 1059 01/09/20 0208 01/10/20 1355  WBC 15.2*   < > 11.8* 9.5 7.6  NEUTROABS 14.7*   < > 10.2* 8.5* 5.6  HGB 10.1*   < > 9.5* 10.4* 9.6*  HCT 31.5*   < > 30.4* 32.0* 30.5*  MCV 107.9*  --  108.2* 105.6* 106.3*  PLT 156   < > 125* 145* 133*   < > = values in this interval not displayed.   Blood Culture    Component Value Date/Time   SDES BLOOD RIGHT HAND 01/07/2020 1930   SPECREQUEST  01/07/2020 1930    BOTTLES DRAWN  AEROBIC ONLY Blood Culture adequate volume   CULT  01/07/2020 1930    NO GROWTH 4 DAYS Performed at [facility], [place] [address], [place], [place] [address]    REPTSTATUS PENDING 01/07/2020 1930    Cardiac Enzymes: No results for input(s): CKTOTAL, CKMB, CKMBINDEX, TROPONINI in the last 168 hours. CBG: Recent Labs  Lab 01/11/20 0621 01/11/20 1210 01/11/20 1553 01/11/20 2213 01/12/20 0623  GLUCAP 107* 133* 133* 132* 127*   Iron Studies: No results for input(s): IRON, TIBC, TRANSFERRIN, FERRITIN in the last 72 hours. @lablastinr3 @ Studies/Results: No results found. Medications: . metronidazole 500 mg (01/12/20 0639)   . aspirin  81 mg Oral Daily  . azaTHIOprine  50 mg Oral Q M,W,F-HD  . Chlorhexidine Gluconate Cloth  6 each Topical Q0600  . cycloSPORINE  100 mg Oral QHS  . cycloSPORINE  75 mg Oral Daily  . [START ON 01/13/2020] darbepoetin (ARANESP) injection - DIALYSIS  25 mcg Intravenous Q Mon-HD  . dorzolamide-timolol  1 drop Both Eyes BID  . fluticasone  2 spray Each Nare Daily  . heparin  5,000 Units Subcutaneous Q8H  . insulin aspart  0-9 Units Subcutaneous TID AC & HS  . insulin aspart  5 Units Subcutaneous QAC breakfast  . insulin aspart  6 Units Subcutaneous QAC lunch  . insulin aspart  6 Units Subcutaneous QAC supper  . midodrine  10 mg Oral TID WC  . multivitamin with minerals  1 tablet Oral Q T,Th,S,Su  . pantoprazole  40 mg Oral BID  . prednisoLONE acetate  1 drop Both Eyes BID  . predniSONE  5 mg Oral Q breakfast  . pyridOXINE  100 mg Oral Daily  . sulfamethoxazole-trimethoprim  1 tablet Oral Q M,W,F-2000  . valGANciclovir  450 mg Oral Once per day on Mon Fri  . vancomycin  500 mg Oral Q6H     MWF-NW 4hrs, BFR400, DFR800, EDW 67.5kg,2K/2Ca Access:LU AVF No Heparin  Hectorol47mcg IV qHD   Assessment/Plan: 1. Sepsis secondary to severe C Difficile- On oral Vanc. Primary has discussed case with [facility] transplant MD. Per  primary 2. Hypoxia-Intermittent O2 use during admission - currently on RA. Per primary 3. Diarrhea - See # 1. CT abd/pelvis with no acute abnormalities. GI panelnegative.C Diff +. Broad spec abx dc'd and now getting po vanc and IV flagyl for Cdif.  4. Hepatic cirrhosis - New finding on [place] completed last week due to elevated labs. Per primary   5. ESRD- On HD MWF. Next HD 01/13/2020. No heparin. 4.0 K bath.   6. Hypotension/volume- chronic hypotension on midodrine.HD 08/06. Now with pedal edema, changed from yesterday. Wt 68.1 kg, only slightly above OP EDW. Get standing wt in HD tomorrow. Challenge EDW and try to lower volume as tolerated.  7. Anemiaof CKD-lastHgb9.6.Start ESA with HD Monday.  8. Secondary Hyperparathyroidism -C Ca 9.3,PO4 1.4 08/06. Stop binders. Recheck Monday.ContinueVDRA.  9. Nutrition- Renal diet w/fluid restrictions 10. Hx lung transplant/On immunosuppression- on azathioprine, cyclosporin, prednisone. Per primary 11. DMT2 -per primary 12. GERD - Hx bleeding duodenal ulcer  Rita H. Brown NP-C 01/12/2020, 9:34 AM  [facility] [phone]  Pt seen, examined and agree w assess/plan as above with additions as indicated.  [facility] 01/12/2020, 2:14 PM

## 2020-01-13 LAB — CBC WITH DIFFERENTIAL/PLATELET
Abs Immature Granulocytes: 0.25 10*3/uL — ABNORMAL HIGH (ref 0.00–0.07)
Basophils Absolute: 0.1 10*3/uL (ref 0.0–0.1)
Basophils Relative: 1 %
Eosinophils Absolute: 0.1 10*3/uL (ref 0.0–0.5)
Eosinophils Relative: 1 %
HCT: 32 % — ABNORMAL LOW (ref 39.0–52.0)
Hemoglobin: 10.1 g/dL — ABNORMAL LOW (ref 13.0–17.0)
Immature Granulocytes: 4 %
Lymphocytes Relative: 17 %
Lymphs Abs: 1 10*3/uL (ref 0.7–4.0)
MCH: 32.9 pg (ref 26.0–34.0)
MCHC: 31.6 g/dL (ref 30.0–36.0)
MCV: 104.2 fL — ABNORMAL HIGH (ref 80.0–100.0)
Monocytes Absolute: 1.5 10*3/uL — ABNORMAL HIGH (ref 0.1–1.0)
Monocytes Relative: 27 %
Neutro Abs: 2.8 10*3/uL (ref 1.7–7.7)
Neutrophils Relative %: 50 %
Platelets: 171 10*3/uL (ref 150–400)
RBC: 3.07 MIL/uL — ABNORMAL LOW (ref 4.22–5.81)
RDW: 14.8 % (ref 11.5–15.5)
WBC: 5.7 10*3/uL (ref 4.0–10.5)
nRBC: 0 % (ref 0.0–0.2)

## 2020-01-13 LAB — COMPREHENSIVE METABOLIC PANEL
ALT: 15 U/L (ref 0–44)
AST: 24 U/L (ref 15–41)
Albumin: 2.4 g/dL — ABNORMAL LOW (ref 3.5–5.0)
Alkaline Phosphatase: 220 U/L — ABNORMAL HIGH (ref 38–126)
Anion gap: 15 (ref 5–15)
BUN: 46 mg/dL — ABNORMAL HIGH (ref 8–23)
CO2: 23 mmol/L (ref 22–32)
Calcium: 8.7 mg/dL — ABNORMAL LOW (ref 8.9–10.3)
Chloride: 102 mmol/L (ref 98–111)
Creatinine, Ser: 8.66 mg/dL — ABNORMAL HIGH (ref 0.61–1.24)
GFR calc Af Amer: 6 mL/min — ABNORMAL LOW (ref >60)
GFR calc non Af Amer: 6 mL/min — ABNORMAL LOW (ref >60)
Glucose, Bld: 102 mg/dL — ABNORMAL HIGH (ref 70–99)
Potassium: 3.8 mmol/L (ref 3.5–5.1)
Sodium: 140 mmol/L (ref 135–145)
Total Bilirubin: 0.9 mg/dL (ref 0.3–1.2)
Total Protein: 5.5 g/dL — ABNORMAL LOW (ref 6.5–8.1)

## 2020-01-13 LAB — GLUCOSE, CAPILLARY
Glucose-Capillary: 144 mg/dL — ABNORMAL HIGH (ref 70–99)
Glucose-Capillary: 104 mg/dL — ABNORMAL HIGH (ref 70–99)
Glucose-Capillary: 85 mg/dL (ref 70–99)
Glucose-Capillary: 125 mg/dL — ABNORMAL HIGH (ref 70–99)
Glucose-Capillary: 204 mg/dL — ABNORMAL HIGH (ref 70–99)

## 2020-01-13 LAB — PROCALCITONIN: Procalcitonin: 9.59 ng/mL

## 2020-01-13 MED ORDER — DARBEPOETIN ALFA 25 MCG/0.42ML IJ SOSY
PREFILLED_SYRINGE | INTRAMUSCULAR | Status: AC
  Administered 2020-01-13: 25 ug via INTRAVENOUS
  Filled 2020-01-13: qty 0.42

## 2020-01-13 MED ORDER — MIDODRINE HCL 5 MG PO TABS
ORAL_TABLET | ORAL | Status: AC
  Administered 2020-01-13: 10 mg via ORAL
  Filled 2020-01-13: qty 10

## 2020-01-13 MED ORDER — VANCOMYCIN 50 MG/ML ORAL SOLUTION
125.0000 mg | Freq: Four times a day (QID) | ORAL | Status: DC
  Administered 2020-01-13 – 2020-01-14 (×4): 125 mg via ORAL
  Filled 2020-01-13 (×5): qty 2.5

## 2020-01-13 MED ORDER — VANCOMYCIN HCL 125 MG PO CAPS: 125.0000 mg | ORAL_CAPSULE | Freq: Four times a day (QID) | ORAL | 0 refills | Status: AC

## 2020-01-13 MED FILL — VANCOMYCIN HCL 125 MG CAP: 125 | 10 days supply | Qty: 40 | Fill #0

## 2020-01-13 NOTE — Progress Notes (Addendum)
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 Work up thus far unrevealing. Sars Covid 19 and viral resp panel negative.  No evidence of diverticulitis on CT scan.  CTA chest negative for PE.  No clear evidence of pulmonary infiltrates.  Started on IV abx empirically in the ED. Blood cx negative to date.  States was having diarrhea prior to admission and had watery stools this AM. GI panel negative.  c-diff pcr, antigen and toxin positive.  Started on C-diff directed treatment.  Enteric precautions.   01/13/20: HD today.  Ongoing treatment for c-diff infection.  Seen with his spouse in the room.  He is upset because he had 2 or 3 soft stools since he woke up today.  Was hoping that he will no longer have them by now.  Explained to him and his wife that recovery from c-diff diarrheal infection can sometimes be slow.  He is tolerating a diet.  No significant abdominal pain.  Leukocytosis and neutrophilia have resolved.  Afebrile and vital signs are stable.   Assessment/Plan: Principal Problem:   Sepsis with acute organ dysfunction (HCC) Active Problems:   GERD without esophagitis   Essential hypertension   ILD (interstitial lung disease) ([place])   Acute respiratory failure with hypoxia (HCC)   End-stage renal disease on hemodialysis (HCC)   Lactic acidosis   Mixed diabetic hyperlipidemia associated with type 2 diabetes mellitus ([place])   Hepatic  cirrhosis ([place])   Uncontrolled type 2 diabetes mellitus with hyperglycemia, with long-term current use of insulin (HCC)   IV infiltrate, initial encounter   Acute diarrhea   Acute metabolic encephalopathy  Sepsis, resolved, secondary to severe C. difficile infection with diarrhea  Presented with diarrhea and sirs criteria including fever with T-max 101.7, substantial leukocytosis WBC 15 K, tachycardia heart rate 101 in the setting of immunosuppression and ESRD on HD MWF.    GI panel, C. difficile PCR, antigen and toxin positive  Started on C-diff directed treatment for severe C. difficile.  Enteric precautions.  Continue p.o. vancomycin and IV Flagyl, day #3.  Patient is immunocompromised due to known history of R lung transplant on immunosuppressive therapy including azathioprine, cyclosporine and prednisone.   Chest x-ray reveals no obvious evidence of new infiltrate however there may be a brewing pneumonia in the patient's left lung which is obscured by chronic infiltrates on chest x-ray.  Additionally, patient is complaining of a 1 day history of watery diarrhea so a concurrent infectious colitis is also possible.  Case was discussed with covering transplant physician at [facility] by the emergency department provider.  This was Dr. Robley Fries covering for Dr. Wynetta Emery.  They recommended obtaining CT angiogram of the chest as well as CT imaging of the abdomen to identify for any evidence of pulmonary emboli or sources of infection.  It is also recommended that the patient undergo a respiratory viral panel including testing for RSV  COVID-19 PCR testing negative, acute respiratory panel negative, GI panel negative.  Received broad-spectrum intravenous antibiotic therapy with cefepime, metronidazole and IV vancomycin, dced on 8/6  Blood cultures have been obtained 8/5.  Continue to follow cultures.  Negative to date.  Obtain labs at hemodialysis tomorrow.  Elevated LFTs Down trending AST  ALT Tbili have  Normalized and Alk phos is downtrending  Resolved situational nausea with vomiting States he vomited once this morning after taking oral vancomycin Will give IV antiemetics prior to administering p.o. vancomycin.  Physical debility in the setting of severe C. difficile infection PT to assess Encourage increase in oral protein calorie intake Continue PT with assistance and fall precautions   Resolved acute respiratory failure with hypoxia ([place]) Suspect secondary to atelectasis  Patient presenting with new onset hypoxia.    Now on room air with O2 saturation in the mid 90s.    Continue incentive spirometer.      Resolved acute metabolic encephalopathy Currently back to his baseline mentation, alert and oriented x4.  Lethargic on presentation.    Conversational, but still weak appearing, states he feels better this morning    End-stage renal disease on hemodialysis MWF(HCC) Nephrology following Was hemodialyzed on 8/6  History of hypovolemia hypotension On midodrine prior to admission 10 mg prior to hemodialysis Monday Wednesday Friday. Increased dose of midodrine 10 mg 3 times daily. Maintain MAP greater than 65 Closely monitor vital signs.    Hepatic cirrhosis ([place])  Possible early cirrhosis on imaging  Monitor LFTs on current immunosuppressants  patient's transplant provider Dr. Wynetta Emery at [facility].  Patient denies alcohol use    Uncontrolled type 2 diabetes mellitus with hyperglycemia, likely contributed by chronic steroid use in the setting of R lung transplant. Hemoglobin A1c 5.8 on 01/08/20 Continue insulin sliding scale    IV infiltrate, initial encounter   Patient unfortunately experienced a right upper extremity small volume infiltration of contrast upon first attempt of performing contrast CTs in the emergency department.  Patient was immediately evaluated by Dr. Gerilyn Nestle with radiology who placed extravasation protocol orders and  recommended conservative management.    GERD without esophagitis   Continue home regimen of PPI  Of note, patient has known history of gastrointestinal bleeding secondary to bleeding duodenal ulcer.    Code Status:  Full code Family Communication:  Updated his wife via phone.    Consultants:  Nephrology  Interventional radiology  Procedures:  Contrast extravasation  Antimicrobials:  IV vancomycin  Cefepime  IV Flagyl  DVT prophylaxis: Subcu heparin 3 times daily  Status is: Inpatient    Dispo:  Patient From: Home  Planned Disposition: Home with Health Care Svc  Expected discharge date: 01/14/20  Medically stable for discharge: No, ongoing tx for severe c-diff infection        Objective: Vitals:   01/13/20 1030 01/13/20 1100 01/13/20 1133 01/13/20 1249  BP: 118/60 117/71 126/71 (!) 147/74  Pulse: 67 69 71 72  Resp:   16 18  Temp:   97.6 F (36.4 C) (!) 97.5 F (36.4 C)  TempSrc:   Oral Oral  SpO2:   98% 98%  Weight:   61.7 kg   Height:        Intake/Output Summary (Last 24 hours) at 01/13/2020 1342 Last data filed at 01/13/2020 1133 Gross per 24 hour  Intake --  Output 2504 ml  Net -2504 ml   Filed Weights   01/13/20 0438 01/13/20 0730 01/13/20 1133  Weight: 68.3 kg 64.4 kg 61.7 kg    Exam:  . General: 69 y.o. year-old male chronically ill-appearing no acute distress. Alert and oriented x3.  . Cardiovascular: Regular rate  and rhythm no rubs or gallops. Marland Kitchen Respiratory: Clear to auscultation no wheezes or rales. . Abdomen: Soft nontender normal bowel sounds present.  . Musculoskeletal: No lower extremity edema bilaterally.  Marland Kitchen Psychiatry: Mood is appropriate for condition and setting    Data Reviewed: CBC: Recent Labs  Lab 01/07/20 2011 01/08/20 1059 01/09/20 0208 01/10/20 1355 01/13/20 0549  WBC 15.2* 11.8* 9.5 7.6 5.7  NEUTROABS 14.7* 10.2* 8.5* 5.6 2.8  HGB 10.1* 9.5* 10.4* 9.6* 10.1*  HCT 31.5* 30.4* 32.0* 30.5*  32.0*  MCV 107.9* 108.2* 105.6* 106.3* 104.2*  PLT 156 125* 145* 133* 240   Basic Metabolic Panel: Recent Labs  Lab 01/07/20 2011 01/08/20 1059 01/09/20 0208 01/09/20 1049 01/10/20 1355 01/13/20 0549  NA 137 134* 136  --  135 140  K 3.7 3.7 3.8  --  3.8 3.8  CL 93* 94* 98  --  97* 102  CO2 _0 --  25 23  GLUCOSE 197* 103* 127*  --  112* 102*  BUN 44* 52* 20  --  14 46*  CREATININE 8.78* 9.50* 4.72*  --  3.43* 8.66*  CALCIUM 8.7* 8.1* 8.3*  --  8.2* 8.7*  MG  --  2.1 1.9  --   --   --   PHOS  --   --   --  2.6 1.4*  --    GFR: Estimated Creatinine Clearance: 6.4 mL/min (A) (by C-G formula based on SCr of 8.66 mg/dL (H)). Liver Function Tests: Recent Labs  Lab 01/07/20 2011 01/08/20 1059 01/09/20 0208 01/10/20 1355 01/13/20 0549  AST 47* 34 43* 37 24  ALT _1 ALKPHOS 235* 212* 211* 232* 220*  BILITOT 3.5* 2.7* 2.1* 1.5* 0.9  PROT 6.0* 5.5* 5.7* 5.9* 5.5*  ALBUMIN 2.7* 2.4* 2.5* 2.5* 2.4*   No results for input(s): LIPASE, AMYLASE in the last 168 hours. No results for input(s): AMMONIA in the last 168 hours. Coagulation Profile: Recent Labs  Lab 01/07/20 2011  INR 1.1   Cardiac Enzymes: No results for input(s): CKTOTAL, CKMB, CKMBINDEX, TROPONINI in the last 168 hours. BNP (last 3 results) No results for input(s): PROBNP in the last 8760 hours. HbA1C: No results for input(s): HGBA1C in the last 72 hours. CBG: Recent Labs  Lab 01/12/20 1127 01/12/20 1627 01/12/20 2109 01/13/20 0608 01/13/20 1246  GLUCAP 182* 155* 144* 104* 85   Lipid Profile: No results for input(s): CHOL, HDL, LDLCALC, TRIG, CHOLHDL, LDLDIRECT in the last 72 hours. Thyroid Function Tests: No results for input(s): TSH, T4TOTAL, FREET4, T3FREE, THYROIDAB in the last 72 hours. Anemia Panel: No results for input(s): VITAMINB12, FOLATE, FERRITIN, TIBC, IRON, RETICCTPCT in the last 72 hours. Urine analysis:    Component Value Date/Time   COLORURINE AMBER (A)  12/31/2015 0515   APPEARANCEUR CLOUDY (A) 12/31/2015 0515   LABSPEC 1.016 12/31/2015 0515   PHURINE 7.0 12/31/2015 0515   GLUCOSEU NEGATIVE 12/31/2015 0515   HGBUR NEGATIVE 12/31/2015 0515   BILIRUBINUR SMALL (A) 12/31/2015 0515   KETONESUR 15 (A) 12/31/2015 0515   PROTEINUR 100 (A) 12/31/2015 0515   UROBILINOGEN 2.0 (H) 08/15/2013 1916   NITRITE NEGATIVE 12/31/2015 0515   LEUKOCYTESUR NEGATIVE 12/31/2015 0515   Sepsis Labs: _2 (procalcitonin:4,lacticidven:4)  ) Recent Results (from the past 240 hour(s))  Culture, blood (Routine x 2)     Status: None   Collection Time: 01/07/20  7:15 PM   Specimen: BLOOD RIGHT ARM  Result Value Ref Range Status  Specimen Description BLOOD RIGHT ARM  Final   Special Requests   Final    BOTTLES DRAWN AEROBIC AND ANAEROBIC Blood Culture results may not be optimal due to an excessive volume of blood received in culture bottles   Culture   Final    NO GROWTH 5 DAYS Performed at [facility], [place] [address], [place], [place] [address]    Report Status 01/12/2020 FINAL  Final  Culture, blood (Routine x 2)     Status: None   Collection Time: 01/07/20  7:30 PM   Specimen: BLOOD RIGHT HAND  Result Value Ref Range Status   Specimen Description BLOOD RIGHT HAND  Final   Special Requests   Final    BOTTLES DRAWN AEROBIC ONLY Blood Culture adequate volume   Culture   Final    NO GROWTH 5 DAYS Performed at [facility], [place] [address] [place], [place] [address]    Report Status 01/12/2020 FINAL  Final  SARS Coronavirus 2 by RT PCR (hospital order, performed in [facility] hospital lab) Nasopharyngeal Nasopharyngeal Swab     Status: None   Collection Time: 01/07/20  7:43 PM   Specimen: Nasopharyngeal Swab  Result Value Ref Range Status   SARS Coronavirus 2 NEGATIVE NEGATIVE Final    Comment: (NOTE) SARS-CoV-2 target nucleic acids are NOT DETECTED.  The SARS-CoV-2 RNA is generally detectable in upper and lower respiratory  specimens during the acute phase of infection. The lowest concentration of SARS-CoV-2 viral copies this assay can detect is 250 copies / mL. A negative result does not preclude SARS-CoV-2 infection and should not be used as the sole basis for treatment or other patient management decisions.  A negative result may occur with improper specimen collection / handling, submission of specimen other than nasopharyngeal swab, presence of viral mutation(s) within the areas targeted by this assay, and inadequate number of viral copies (<250 copies / mL). A negative result must be combined with clinical observations, patient history, and epidemiological information.  Fact Sheet for Patients:   StrictlyIdeas.no  Fact Sheet for Healthcare Providers: BankingDealers.co.za  This test is not yet approved or  cleared by the [place] FDA and has been authorized for detection and/or diagnosis of SARS-CoV-2 by FDA under an Emergency Use Authorization (EUA).  This EUA will remain in effect (meaning this test can be used) for the duration of the COVID-19 declaration under Section 564(b)(1) of the Act, 21 U.S.C. section 360bbb-3(b)(1), unless the authorization is terminated or revoked sooner.  Performed at [facility], [place] [address], [place], [place] [address]   Resp Panel by RT PCR (RSV, Flu A&B, Covid) - Nasopharyngeal Swab     Status: None   Collection Time: 01/08/20  7:16 AM   Specimen: Nasopharyngeal Swab  Result Value Ref Range Status   SARS Coronavirus 2 by RT PCR NEGATIVE NEGATIVE Final    Comment: (NOTE) SARS-CoV-2 target nucleic acids are NOT DETECTED.  The SARS-CoV-2 RNA is generally detectable in upper respiratoy specimens during the acute phase of infection. The lowest concentration of SARS-CoV-2 viral copies this assay can detect is 131 copies/mL. A negative result does not preclude SARS-Cov-2 infection and should not be used as  the sole basis for treatment or other patient management decisions. A negative result may occur with  improper specimen collection/handling, submission of specimen other than nasopharyngeal swab, presence of viral mutation(s) within the areas targeted by this assay, and inadequate number of viral copies (<131 copies/mL).  A negative result must be combined with clinical observations, patient history, and epidemiological information. The expected result is Negative.  Fact Sheet for Patients:  PinkCheek.be  Fact Sheet for Healthcare Providers:  GravelBags.it  This test is no t yet approved or cleared by the [place] FDA and  has been authorized for detection and/or diagnosis of SARS-CoV-2 by FDA under an Emergency Use Authorization (EUA). This EUA will remain  in effect (meaning this test can be used) for the duration of the COVID-19 declaration under Section 564(b)(1) of the Act, 21 U.S.C. section 360bbb-3(b)(1), unless the authorization is terminated or revoked sooner.     Influenza A by PCR NEGATIVE NEGATIVE Final   Influenza B by PCR NEGATIVE NEGATIVE Final    Comment: (NOTE) The Xpert Xpress SARS-CoV-2/FLU/RSV assay is intended as an aid in  the diagnosis of influenza from Nasopharyngeal swab specimens and  should not be used as a sole basis for treatment. Nasal washings and  aspirates are unacceptable for Xpert Xpress SARS-CoV-2/FLU/RSV  testing.  Fact Sheet for Patients: PinkCheek.be  Fact Sheet for Healthcare Providers: GravelBags.it  This test is not yet approved or cleared by the [place] FDA and  has been authorized for detection and/or diagnosis of SARS-CoV-2 by  FDA under an Emergency Use Authorization (EUA). This EUA will remain  in effect (meaning this test can be used) for the duration of the  Covid-19 declaration under Section 564(b)(1)  of the Act, 21  U.S.C. section 360bbb-3(b)(1), unless the authorization is  terminated or revoked.    Respiratory Syncytial Virus by PCR NEGATIVE NEGATIVE Final    Comment: (NOTE) Fact Sheet for Patients: PinkCheek.be  Fact Sheet for Healthcare Providers: GravelBags.it  This test is not yet approved or cleared by the [place] FDA and  has been authorized for detection and/or diagnosis of SARS-CoV-2 by  FDA under an Emergency Use Authorization (EUA). This EUA will remain  in effect (meaning this test can be used) for the duration of the  COVID-19 declaration under Section 564(b)(1) of the Act, 21 U.S.C.  section 360bbb-3(b)(1), unless the authorization is terminated or  revoked. Performed at [facility], [place] [address], [place], [place] [address]   Gastrointestinal Panel by PCR , Stool     Status: None   Collection Time: 01/08/20  7:16 AM   Specimen: Stool  Result Value Ref Range Status   Campylobacter species NOT DETECTED NOT DETECTED Final   Plesimonas shigelloides NOT DETECTED NOT DETECTED Final   Salmonella species NOT DETECTED NOT DETECTED Final   Yersinia enterocolitica NOT DETECTED NOT DETECTED Final   Vibrio species NOT DETECTED NOT DETECTED Final   Vibrio cholerae NOT DETECTED NOT DETECTED Final   Enteroaggregative E coli (EAEC) NOT DETECTED NOT DETECTED Final   Enteropathogenic E coli (EPEC) NOT DETECTED NOT DETECTED Final   Enterotoxigenic E coli (ETEC) NOT DETECTED NOT DETECTED Final   Shiga like toxin producing E coli (STEC) NOT DETECTED NOT DETECTED Final   Shigella/Enteroinvasive E coli (EIEC) NOT DETECTED NOT DETECTED Final   Cryptosporidium NOT DETECTED NOT DETECTED Final   Cyclospora cayetanensis NOT DETECTED NOT DETECTED Final   Entamoeba histolytica NOT DETECTED NOT DETECTED Final   Giardia lamblia NOT DETECTED NOT DETECTED Final   Adenovirus F40/41 NOT DETECTED NOT DETECTED Final    Astrovirus NOT DETECTED NOT DETECTED Final   Norovirus GI/GII NOT DETECTED NOT DETECTED Final   Rotavirus A NOT DETECTED NOT DETECTED Final   Sapovirus (I, II, IV,  and V) NOT DETECTED NOT DETECTED Final    Comment: Performed at [facility], [place] [place], [place] [address]  MRSA PCR Screening     Status: None   Collection Time: 01/09/20  8:20 AM   Specimen: Nasal Mucosa; Nasopharyngeal  Result Value Ref Range Status   MRSA by PCR NEGATIVE NEGATIVE Final    Comment:        The GeneXpert MRSA Assay (FDA approved for NASAL specimens only), is one component of a comprehensive MRSA colonization surveillance program. It is not intended to diagnose MRSA infection nor to guide or monitor treatment for MRSA infections. Performed at [facility], [place] [address], [place], [place] [address]   C Difficile Quick Screen w PCR reflex     Status: Abnormal   Collection Time: 01/09/20 12:23 PM   Specimen: STOOL  Result Value Ref Range Status   C Diff antigen POSITIVE (A) NEGATIVE Final   C Diff toxin POSITIVE (A) NEGATIVE Final   C Diff interpretation Toxin producing C. difficile detected.  Final    Comment: CRITICAL RESULT CALLED TO, READ BACK BY AND VERIFIED WITH: Patrcia Dolly RN 1610 01/09/20 A BROWNING Performed at [facility], [place] [address], [place], [place] [address]       Studies: No results found.  Scheduled Meds: . aspirin  81 mg Oral Daily  . azaTHIOprine  50 mg Oral Q M,W,F-HD  . Chlorhexidine Gluconate Cloth  6 each Topical Q0600  . cycloSPORINE  100 mg Oral QHS  . cycloSPORINE  75 mg Oral Daily  . darbepoetin (ARANESP) injection - DIALYSIS  25 mcg Intravenous Q Mon-HD  . dorzolamide-timolol  1 drop Both Eyes BID  . fluticasone  2 spray Each Nare Daily  . heparin  5,000 Units Subcutaneous Q8H  . insulin aspart  0-9 Units Subcutaneous TID AC & HS  . insulin aspart  5 Units Subcutaneous QAC breakfast  . insulin aspart  6 Units Subcutaneous QAC  lunch  . insulin aspart  6 Units Subcutaneous QAC supper  . midodrine  10 mg Oral TID WC  . multivitamin with minerals  1 tablet Oral Q T,Th,S,Su  . pantoprazole  40 mg Oral BID  . prednisoLONE acetate  1 drop Both Eyes BID  . predniSONE  5 mg Oral Q breakfast  . pyridOXINE  100 mg Oral Daily  . sulfamethoxazole-trimethoprim  1 tablet Oral Q M,W,F-2000  . valGANciclovir  450 mg Oral Once per day on Mon Fri  . vancomycin  500 mg Oral Q6H    Continuous Infusions: . metronidazole 500 mg (01/13/20 0513)     LOS: 5 days     Kayleen Memos, MD [facility] Pager [phone]  If 7PM-7AM, please contact night-coverage [email or web address] Password [email or web address] 01/13/2020, 1:42 PM

## 2020-01-13 NOTE — Progress Notes (Signed)
[facility] Progress Note   Subjective:   Seen on HD - feeling better today, diarrhea improving. 2.5L UFG on HD - tolerating.  Objective Vitals:   01/12/20 1944 01/12/20 2312 01/13/20 0437 01/13/20 0438  BP: 127/68 130/67 138/79   Pulse: 67 65 71   Resp: 20 18 18    Temp: 97.8 F (36.6 C) 97.6 F (36.4 C) 98 F (36.7 C)   TempSrc: Oral Oral Oral   SpO2: 98% 97% 99%   Weight:    68.3 kg  Height:       Physical Exam General: Chronically ill man, NAD Heart: RRR Lungs: CTA anteriorly Abdomen: soft, non-tender Extremities: Trace LE edema Dialysis Access: LUE AVF + bruit  Additional Objective Labs: Basic Metabolic Panel: Recent Labs  Lab 01/09/20 0208 01/09/20 1049 01/10/20 1355 01/13/20 0549  NA 136  --  135 140  K 3.8  --  3.8 3.8  CL 98  --  97* 102  CO2 26  --  25 23  GLUCOSE 127*  --  112* 102*  BUN 20  --  14 46*  CREATININE 4.72*  --  3.43* 8.66*  CALCIUM 8.3*  --  8.2* 8.7*  PHOS  --  2.6 1.4*  --    Liver Function Tests: Recent Labs  Lab 01/09/20 0208 01/10/20 1355 01/13/20 0549  AST 43* 37 24  ALT 22 23 15   ALKPHOS 211* 232* 220*  BILITOT 2.1* 1.5* 0.9  PROT 5.7* 5.9* 5.5*  ALBUMIN 2.5* 2.5* 2.4*   CBC: Recent Labs  Lab 01/07/20 2011 01/07/20 2011 01/08/20 1059 01/08/20 1059 01/09/20 0208 01/10/20 1355 01/13/20 0549  WBC 15.2*   < > 11.8*   < > 9.5 7.6 5.7  NEUTROABS 14.7*   < > 10.2*   < > 8.5* 5.6 2.8  HGB 10.1*   < > 9.5*   < > 10.4* 9.6* 10.1*  HCT 31.5*   < > 30.4*   < > 32.0* 30.5* 32.0*  MCV 107.9*  --  108.2*  --  105.6* 106.3* 104.2*  PLT 156   < > 125*   < > 145* 133* 171   < > = values in this interval not displayed.   Blood Culture    Component Value Date/Time   SDES BLOOD RIGHT HAND 01/07/2020 1930   SPECREQUEST  01/07/2020 1930    BOTTLES DRAWN AEROBIC ONLY Blood Culture adequate volume   CULT  01/07/2020 1930    NO GROWTH 5 DAYS Performed at [facility], [place] [address] [place], [place]  [address]    REPTSTATUS 01/12/2020 FINAL 01/07/2020 1930   Medications: . metronidazole 500 mg (01/13/20 0513)   . aspirin  81 mg Oral Daily  . azaTHIOprine  50 mg Oral Q M,W,F-HD  . Chlorhexidine Gluconate Cloth  6 each Topical Q0600  . cycloSPORINE  100 mg Oral QHS  . cycloSPORINE  75 mg Oral Daily  . darbepoetin (ARANESP) injection - DIALYSIS  25 mcg Intravenous Q Mon-HD  . dorzolamide-timolol  1 drop Both Eyes BID  . fluticasone  2 spray Each Nare Daily  . heparin  5,000 Units Subcutaneous Q8H  . insulin aspart  0-9 Units Subcutaneous TID AC & HS  . insulin aspart  5 Units Subcutaneous QAC breakfast  . insulin aspart  6 Units Subcutaneous QAC lunch  . insulin aspart  6 Units Subcutaneous QAC supper  . midodrine  10 mg Oral TID WC  . multivitamin with minerals  1 tablet Oral Q T,Th,S,Su  . pantoprazole  40 mg Oral BID  . prednisoLONE acetate  1 drop Both Eyes BID  . predniSONE  5 mg Oral Q breakfast  . pyridOXINE  100 mg Oral Daily  . sulfamethoxazole-trimethoprim  1 tablet Oral Q M,W,F-2000  . valGANciclovir  450 mg Oral Once per day on Mon Fri  . vancomycin  500 mg Oral Q6H    Dialysis Orders: MWF-NW 4hrs, BFR400, PZW258, EDW 67.5kg,2K/2Ca Access:LU AVF No Heparin Hectorol23mcg IV qHD   Assessment/Plan: 1. Sepsissecondary to severe C Difficile-On oral Vanc + IV Flagyl. Primary has discussed case with [facility] transplant MD.Per primary 2. Hypoxia-Intermittent O2 use during admission. 3. Diarrhea -See # 1.CT abd/pelvis with no acute abnormalities. GI panelnegative.C Diff +.  4. Hepatic cirrhosis - New finding on [place] completed last week due to elevated labs. Per primary 5. ESRD- On HD MWF - HD today. 6. Hypotension/volume- chronic hypotension on midodrine.Some pedal edema - challenging EDW today. 7. Anemiaof CKD-Hgb 10.1, due for ESA today. 8. Secondary Hyperparathyroidism - Phos very low 8/6 - binders held. ContinueVDRA.   9. Nutrition- Renal diet w/fluid restrictions 10. Hx lung transplant/On immunosuppression- on azathioprine, cyclosporin, prednisone. Per primary 11. DMT2 -per primary 12. GERD - Hx bleeding duodenal ulcer  Veneta Penton, PA-C 01/13/2020, 8:40 AM  [employer]

## 2020-01-14 LAB — CBC WITH DIFFERENTIAL/PLATELET
Abs Immature Granulocytes: 0.28 10*3/uL — ABNORMAL HIGH (ref 0.00–0.07)
Basophils Absolute: 0.1 10*3/uL (ref 0.0–0.1)
Basophils Relative: 1 %
Eosinophils Absolute: 0 10*3/uL (ref 0.0–0.5)
Eosinophils Relative: 1 %
HCT: 31.7 % — ABNORMAL LOW (ref 39.0–52.0)
Hemoglobin: 10.2 g/dL — ABNORMAL LOW (ref 13.0–17.0)
Immature Granulocytes: 5 %
Lymphocytes Relative: 15 %
Lymphs Abs: 0.9 10*3/uL (ref 0.7–4.0)
MCH: 33.7 pg (ref 26.0–34.0)
MCHC: 32.2 g/dL (ref 30.0–36.0)
MCV: 104.6 fL — ABNORMAL HIGH (ref 80.0–100.0)
Monocytes Absolute: 1.4 10*3/uL — ABNORMAL HIGH (ref 0.1–1.0)
Monocytes Relative: 23 %
Neutro Abs: 3.4 10*3/uL (ref 1.7–7.7)
Neutrophils Relative %: 55 %
Platelets: 209 10*3/uL (ref 150–400)
RBC: 3.03 MIL/uL — ABNORMAL LOW (ref 4.22–5.81)
RDW: 15.2 % (ref 11.5–15.5)
WBC: 6 10*3/uL (ref 4.0–10.5)
nRBC: 0.7 % — ABNORMAL HIGH (ref 0.0–0.2)

## 2020-01-14 LAB — PROCALCITONIN: Procalcitonin: 7.22 ng/mL

## 2020-01-14 LAB — GLUCOSE, CAPILLARY
Glucose-Capillary: 146 mg/dL — ABNORMAL HIGH (ref 70–99)
Glucose-Capillary: 102 mg/dL — ABNORMAL HIGH (ref 70–99)

## 2020-01-14 MED ORDER — MIDODRINE HCL 5 MG PO TABS
10.0000 mg | ORAL_TABLET | Freq: Every day | ORAL | Status: DC
  Administered 2020-01-14: 10 mg via ORAL
  Filled 2020-01-14: qty 2

## 2020-01-14 NOTE — Care Management Important Message (Signed)
Important Message  Patient Details  Name: MELBOURNE JAKUBIAK MRN: [id number] Date of Birth: November 09, 1950    Medicare Important Message Given:  Yes Gave to NT to deliver to pt. Pt on precaution.     Holli Humbles Smith 01/14/2020, 11:30 AM

## 2020-01-14 NOTE — Discharge Instructions (Signed)
Clostridioides Difficile Infection Clostridioides difficile, or C. diff, infection is caused by germs (bacteria). It causes irritation and swelling of the colon (colitis). This infection can spread from person to person (is contagious). You may also get C. diff from food or water, or from touching surfaces that have the germs on them. What are the causes? Certain germs live in the colon and help to digest food. This infection starts when the balance of helpful germs in the colon changes, and the C. diff germs grow out of control. This is caused by taking antibiotics. What increases the risk? Your risk is higher if you:  Take certain antibiotics that kill many types of germs.  Take antibiotics for a long time.  Stay for a long time in a health care setting, such as: ? A hospital. ? A long-term care facility.  Are older than age 65. Your risk is somewhat higher if you:  Have had C. diff infection before or had contact with C. diff germs.  Have a weak body defense system (immune system).  Take a medicine for a long time that reduces stomach acid. This includes proton pump inhibitors.  Have serious health problems, such as: ? Colon cancer. ? Inflammatory bowel disease (IBD).  Have had a procedure or surgery on your gastrointestinal (GI) tract. Some people develop C. diff even though they are not clearly at risk. What are the signs or symptoms?  Watery poop (diarrhea).  Fever.  Tiredness (fatigue).  Loss of appetite.  Nausea.  Swelling, pain, cramping, or tenderness in your belly (abdomen). How is this treated?  Stopping the antibiotics that you were taking when the C. diff infection began. Do this only as told by your doctor.  Taking certain antibiotics to stop C. diff from growing.  Taking donor poop (stool) from a healthy person and placing it into the colon. This may be done if the infection keeps coming back.  Having surgery to remove the infected part of the  colon. This is rare. Follow these instructions at home: Medicines  Take over-the-counter and prescription medicines only as told by your doctor.  Take your antibiotic medicine as told by your doctor. Do not stop taking the antibiotic even if you start to feel better.  Do not treat watery poop with medicines unless your doctor tells you to. Eating and drinking   Follow instructions from your doctor about eating or drinking.  Eat bland foods in small amounts as you are able. These foods include: ? Bananas. ? Applesauce. ? Rice. ? Low-fat (lean) meats. ? Toast. ? Crackers.  Follow your doctor's instructions on how to get enough fluids into your body. You may need to: ? Drink clear fluids. This includes water, fruit juice you have added water to, or low-calorie sports drinks. ? Suck on ice chips. ? Take an ORS (oral rehydration solution).  Avoid milk, caffeine, and alcohol.  Drink enough fluid to keep your pee (urine) pale yellow. Activity  Rest as told by your doctor.  Return to your normal activities as told by your doctor. Ask your doctor what activities are safe for you. General instructions  Wash your hands often with soap and water. Do this for at least 20 seconds. Bathe using soap and water daily.  Be sure your home is clean before you leave the hospital or clinic to go home.  Continue daily cleaning for at least a week after going home.  Keep all follow-up visits as told by your doctor. This is important.   How is this prevented? Hand hygiene   Wash your hands well before you cook and after you use the bathroom. Use soap and water for at least 20 seconds. Make sure that people who live with you also wash their hands often.  If you are being treated at a hospital or clinic, make sure that: ? All doctors and nurses wash their hands with soap and water before touching you. ? All visitors wash their hands with soap and water before touching you. Contact  precautions  If you get watery poop while you are in the hospital or a long-term care facility, let your doctor know right away.  When you visit someone in the hospital or a long-term care facility, follow the rules for wearing a gown, gloves, or other protective equipment.  If possible, avoid contact with people who have watery poop.  If you are sick and live with other people, use a separate bathroom, if you can. Clean environment  Clean surfaces that are touched often every day. Use a product that has chlorine bleach in it. The bleach should be 10% solution. Be sure to: ? Read the instructions to find out if the product you are using will work on what you are cleaning. ? Clean toilets, bathtubs, sinks, door knobs, and work surfaces.  If you are in the hospital, make sure that the staff cleans the surfaces in your room each day. Tell someone right away if body fluids have splashed or spilled. Washing clothes and linens  Use laundry soap that has chlorine bleach in it to wash clothes and linens. Be sure to: ? Use powder soap instead of liquid. ? Run your washing machine on the hot setting with nothing but soap in it. Do this once a month. Contact a doctor if:  Your symptoms do not get better or they get worse.  Your symptoms go away and then come back.  You have a fever.  You have new symptoms. Get help right away if:  You have more pain or tenderness in your belly.  Your poop is mostly bloody.  Your poop looks dark black and tarry.  You cannot eat or drink without vomiting.  You have signs of not having enough fluids in your body. These include: ? Dark pee, very little pee, or no pee. ? Cracked lips or dry mouth. ? No tears when you cry. ? Sunken eyes. ? Feeling sleepy. ? Feeling weak or dizzy. Summary  C. diff infection may happen after taking antibiotic medicines.  Symptoms include watery poop, fever, tiredness, loss of appetite, nausea, and belly  problems.  Treatment starts with stopping the antibiotics you were using when the infection began. Certain antibiotics are then used to stop C. diff from growing.  This infection is sometimes treated by placing donor poop into the colon or doing surgery.  Washing hands and cleaning every day can help keep C. diff from spreading. This information is not intended to replace advice given to you by your health care provider. Make sure you discuss any questions you have with your health care provider. Document Revised: 10/17/2018 Document Reviewed: 10/17/2018 Elsevier Patient Education  2020 [employer]  

## 2020-01-14 NOTE — Progress Notes (Signed)
Physical Therapy Treatment Patient Details Name: [name] MRN: [id number] DOB: 1950-12-31 Today's Date: 01/14/2020    History of Present Illness Pt adm with malaise and weakness. Pt with SIRS with no clear source of active infection. PMH - ESRD on HD, lung transplant, dm, gi bleed, orthostatic hypotension    PT Comments    Pt doing well with mobility and no further PT needed.  Ready for dc from PT standpoint.     Follow Up Recommendations  No PT follow up;Supervision - Intermittent     Equipment Recommendations  None recommended by PT    Recommendations for Other Services       Precautions / Restrictions      Mobility  Bed Mobility Overal bed mobility: Independent                Transfers Overall transfer level: Independent Equipment used: None Transfers: Sit to/from Stand Sit to Stand: Independent            Ambulation/Gait Ambulation/Gait assistance: Modified independent (Device/Increase time) Gait Distance (Feet): 475 Feet Assistive device: 4-wheeled walker;None Gait Pattern/deviations: WFL(Within Functional Limits)   Gait velocity interpretation: >2.62 ft/sec, indicative of community ambulatory General Gait Details: Steady gait with or without rollator   Stairs             Wheelchair Mobility    Modified Rankin (Stroke Patients Only)       Balance Overall balance assessment: No apparent balance deficits (not formally assessed)                                          Cognition Arousal/Alertness: Awake/alert Behavior During Therapy: WFL for tasks assessed/performed Overall Cognitive Status: Within Functional Limits for tasks assessed                                        Exercises      General Comments        Pertinent Vitals/Pain      Home Living                      Prior Function            PT Goals (current goals can now be found in the care plan section)  Acute Rehab PT Goals Patient Stated Goal: return home Progress towards PT goals: Goals met/education completed, patient discharged from PT    Frequency           PT Plan Current plan remains appropriate    Co-evaluation              AM-PAC PT "6 Clicks" Mobility   Outcome Measure  Help needed turning from your back to your side while in a flat bed without using bedrails?: None Help needed moving from lying on your back to sitting on the side of a flat bed without using bedrails?: None Help needed moving to and from a bed to a chair (including a wheelchair)?: None Help needed standing up from a chair using your arms (e.g., wheelchair or bedside chair)?: None Help needed to walk in hospital room?: None Help needed climbing 3-5 steps with a railing? : None 6 Click Score: 24    End of Session   Activity Tolerance: Patient tolerated treatment well Patient  left: in bed;with call bell/phone within reach (sitting EOB)   PT Visit Diagnosis: Other abnormalities of gait and mobility (R26.89)     Time: 1314-3888 PT Time Calculation (min) (ACUTE ONLY): 10 min  Charges:  $Gait Training: 8-22 mins                     [facility] Pager [phone] Office [place] 01/14/2020, 10:02 AM

## 2020-01-14 NOTE — Discharge Summary (Signed)
Discharge Summary  [name] WUJ:[id number] DOB: 04-21-1951  PCP: Wenda Low, MD  Admit date: 01/07/2020 Discharge date: 01/14/2020  Time spent: 35 minutes  Recommendations for Outpatient Follow-up:  1. Follow-up with your PCP 2. Follow-up with your organ transplant team 3. Follow-up with nephrology 4. Keep your hemodialysis appointments 5. Take your medications as prescribed  Discharge Diagnoses:  Active Hospital Problems   Diagnosis Date Noted  . Sepsis with acute organ dysfunction ([place]) 01/08/2020  . Lactic acidosis 01/08/2020  . Mixed diabetic hyperlipidemia associated with type 2 diabetes mellitus ([place]) 01/08/2020  . Hepatic cirrhosis ([place]) 01/08/2020  . Uncontrolled type 2 diabetes mellitus with hyperglycemia, with long-term current use of insulin ([place]) 01/08/2020  . IV infiltrate, initial encounter 01/08/2020  . Acute diarrhea 01/08/2020  . Acute metabolic encephalopathy 95/62/1308  . End-stage renal disease on hemodialysis ([place])   . Acute respiratory failure with hypoxia ([place]) 04/18/2015  . ILD (interstitial lung disease) ([place]) 05/09/2014  . Essential hypertension 05/09/2014  . GERD without esophagitis 10/09/2008    Resolved Hospital Problems  No resolved problems to display.    Discharge Condition: Stable  Diet recommendation: Resume previous diet.  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 Work up thus far unrevealing. Sars Covid 19 and viral resp panel negative. No evidence of diverticulitis on CT scan. CTA chest negative for PE. No clear evidence of pulmonary infiltrates. Started on IV abx empirically in the ED. Blood cx negative to date.  States was having diarrhea prior to admission and had watery stools this AM. GI panel negative.  c-diff pcr, antigen and toxin positive.  Started on C-diff directed treatment.  Enteric precautions.  Last completed hemodialysis while inpatient on 01/13/2020.  01/14/20: No acute events overnight.  Stool becoming less watery and more formed.  States he feels well today.  Has no new complaints.  Updated his wife via phone.  All questions answered. Leukocytosis and neutrophilia have resolved.  Afebrile.  Blood cultures negative to date.   Hospital Course:  Principal Problem:   Sepsis with acute organ dysfunction ([place]) Active Problems:   GERD without esophagitis   Essential hypertension   ILD (interstitial lung disease) ([place])   Acute respiratory failure with hypoxia (HCC)   End-stage renal disease on hemodialysis (HCC)   Lactic acidosis   Mixed diabetic hyperlipidemia associated with type 2 diabetes mellitus ([place])   Hepatic cirrhosis ([place])   Uncontrolled type 2 diabetes mellitus with hyperglycemia, with long-term current use of insulin (HCC)   IV infiltrate, initial encounter   Acute diarrhea   Acute metabolic encephalopathy  Sepsis, resolved, secondary to severe C. difficile infection with diarrhea  Presented with diarrhea and sirs criteria including fever with T-max 101.7, substantial leukocytosis WBC 15 K, tachycardia heart rate 101 in the setting of immunosuppression and ESRD on HD MWF.   GI panel, C. difficile PCR, antigen and toxin positive  Started on C-diff directed treatment for severe C.  difficile.  Enteric precautions.  Continue p.o. vancomycin and IV Flagyl, day #3.  Patient  is immunocompromised due to known history of R lung transplant on immunosuppressive therapy including azathioprine, cyclosporine and prednisone.  Chest x-ray reveals no obvious evidence of new infiltrate however there may be a brewing pneumonia in the patient's left lung which is obscured by chronic infiltrates on chest x-ray.  Additionally, patient is complaining of a 1 day history of watery diarrhea so a concurrent infectious colitis is also possible.  Case was discussed with covering transplant physician at [facility] by the emergency department provider. This was Dr. Robley Fries covering for Dr. Wynetta Emery. They recommended obtaining CT angiogram of the chest as well as CT imaging of the abdomen to identify for any evidence of pulmonary emboli or sources of infection. It is also recommended that the patient undergo a respiratory viral panel including testing for RSV  COVID-19 PCR testing negative, acute respiratory panel negative, GI panel negative.  Received broad-spectrum intravenous antibiotic therapy with cefepime, metronidazole and IV vancomycin, dced on 8/6  Blood cultures have been obtained 8/5.  Continue to follow cultures.  Negative to date.  Leukocytosis and neutrophilia have resolved.  Afebrile.  Blood cultures negative to date.  Procalcitonin downtrending, from 21.5 to 7.2  Elevated LFTs Down trending AST ALT Tbili have  Normalized and Alk phos is downtrending  Resolved situational nausea with vomiting States he vomited once this morning after taking oral vancomycin Will give IV antiemetics prior to administering p.o. vancomycin.  Physical debility in the setting of severe C. difficile infection PT assessed, no further recommendations. Out of bed to chair as tolerated  Resolved acute respiratory failure with hypoxia ([place]) Suspect secondary to atelectasis  Patient presenting with new onset hypoxia.   Now on room air with O2 saturation in the mid 90s.    Continue  incentive spirometer.    Resolved acute metabolic encephalopathy Currently back to his baseline mentation, alert and oriented x4.  Lethargic on presentation.    Improved.  End-stage renal disease on hemodialysis MWF(HCC) Nephrology following Was hemodialyzed on 8/6 and 01/13/2020.  History of hypovolemia hypotension Resume home regimen of midodrine  Hepatic cirrhosis ([place])  Possible early cirrhosis on imaging  Monitor LFTs on current immunosuppressants  patient's transplant provider Dr. Wynetta Emery at [facility].  Patient denies alcohol use  Uncontrolled type 2 diabetes mellitus with hyperglycemia, likely contributed by chronic steroid use in the setting of R lung transplant. Hemoglobin A1c 5.8 on 01/08/20  IV infiltrate, initial encounter   Patient unfortunately experienced a right upper extremity small volume infiltration of contrast upon first attempt of performing contrast CTs in the emergency department.  Patient was immediately evaluated byDr. Gerilyn Nestle with radiology who placed extravasation protocol orders and recommended conservative management.  GERD without esophagitis   Continue home regimen of PPI  Of note, patient has known history of gastrointestinal bleeding secondary to bleeding duodenal ulcer.    Code Status:Full code    Consultants:  Nephrology  Interventional radiology  Procedures:  Contrast extravasation  Antimicrobials:  IV vancomycin, IV Cefepime, IV Flagyl, started empirically in the ED.  P.o. vancomycin, IV Flagyl, completed on 01/13/2020  P.o. vancomycin from 01/14/2020 through 01/24/2020.    Discharge Exam: BP (!) 147/81 (BP Location: Right Arm)   Pulse 72   Temp 97.8 F (36.6 C) (Oral)   Resp (!) 22   Ht '5\' 3"'  (1.6 m)   Wt 65.8 kg   SpO2 95%   BMI 25.70 kg/m  .  General: 69 y.o. year-old male well developed well nourished in no acute distress.  Alert and oriented x3. . Cardiovascular: Regular rate  and rhythm with no rubs or gallops.  No thyromegaly or JVD noted.   Marland Kitchen Respiratory: Clear to auscultation with no wheezes or rales. Good inspiratory effort. . Abdomen: Soft nontender nondistended with normal bowel sounds x4 quadrants. . Musculoskeletal: No lower extremity edema. 2/4 pulses in all 4 extremities. Marland Kitchen Psychiatry: Mood is appropriate for condition and setting  Discharge Instructions You were cared for by a hospitalist during your hospital stay. If you have any questions about your discharge medications or the care you received while you were in the hospital after you are discharged, you can call the unit and asked to speak with the hospitalist on call if the hospitalist that took care of you is not available. Once you are discharged, your primary care physician will handle any further medical issues. Please note that NO REFILLS for any discharge medications will be authorized once you are discharged, as it is imperative that you return to your primary care physician (or establish a relationship with a primary care physician if you do not have one) for your aftercare needs so that they can reassess your need for medications and monitor your lab values.   Allergies as of 01/14/2020      Reactions   Levofloxacin Other (See Comments)   LOSS OF CONSCIOUSNESS   Nsaids Other (See Comments)   Patient instructed not to take NSAID's after his lung transplant      Medication List    STOP taking these medications   loperamide 2 MG capsule Commonly known as: IMODIUM     TAKE these medications   Accu-Chek FastClix Lancets Misc As directed up to 4 times daily   acetaminophen 500 MG tablet Commonly known as: TYLENOL Take 500-1,000 mg by mouth every 6 (six) hours as needed for moderate pain or headache.   Alpha Lipoic Acid 200 MG Caps Take 200 mg by mouth in the morning and at bedtime.   aspirin 81 MG tablet Take 81 mg by mouth daily.   azaTHIOprine 50 MG tablet Commonly known as:  IMURAN Take 50 mg by mouth every Monday, Wednesday, and Friday with hemodialysis.   calcium acetate 667 MG capsule Commonly known as: PHOSLO Take 1,334 mg by mouth 3 (three) times daily before meals.   Cosopt PF 22.3-6.8 MG/ML Soln ophthalmic solution Generic drug: dorzolamidel-timolol Place 1 drop into both eyes 2 (two) times daily.   fluticasone 50 MCG/ACT nasal spray Commonly known as: FLONASE Place 2 sprays into the nose daily. What changed:   when to take this  reasons to take this   Gengraf 25 MG capsule Generic drug: cycloSPORINE modified Take 75-100 mg by mouth See admin instructions. Take 3 capsules in the morning and take 4 capsules at night   Immune Globulin 10% 20 GM/200ML Soln Commonly known as: PRIVIGEN Inject 0.5 g/kg into the vein every 3 (three) months.   midodrine 5 MG tablet Commonly known as: PROAMATINE Take 5-10 mg by mouth See admin instructions. Take 2 tablets before dialysis and 1 tablet at night if needed   multivitamin capsule Take 1 capsule by mouth as directed. Take only on (Sun, Tues, Thurs, Sat)   NovoLIN R 100 units/mL injection Generic drug: insulin regular Inject 5-6 Units into the skin See admin instructions. Use 5 units every morning, use 6 units at lunch and dinner and Additional units (per sliding scale): BGL  201-250 = 1 unit; 251-300 = 2 units; 301-350 = 3 units; 351-400 = units; >401 = 5 units + CALL LUNG COORDINATOR @ [facility]   omeprazole 40 MG capsule Commonly known as: PRILOSEC Take 40 mg by mouth in the morning and at bedtime.   pravastatin 20 MG tablet Commonly known as: PRAVACHOL Take 20 mg by mouth at bedtime.   prednisoLONE acetate 1 % ophthalmic suspension Commonly known as: PRED FORTE Place 1 drop into both eyes in the morning and at bedtime.   predniSONE 5 MG tablet Commonly known as: DELTASONE Take 5 mg by mouth daily with breakfast.   pyridOXINE 100 MG tablet Commonly known as: VITAMIN B-6 Take 100 mg by mouth  daily.   Refresh 1.4-0.6 % ophthalmic solution Generic drug: polyvinyl alcohol-povidone Place 1-2 drops into both eyes daily as needed (for dryness).   sulfamethoxazole-trimethoprim 400-80 MG tablet Commonly known as: BACTRIM Take 1 tablet by mouth See admin instructions. Take one tablet by mouth every Monday, Wednesday, and Friday after dialysis   traMADol 50 MG tablet Commonly known as: ULTRAM Take 25 mg by mouth every 12 (twelve) hours as needed for moderate pain.   valGANciclovir 450 MG tablet Commonly known as: VALCYTE Take 1 tablet by mouth See admin instructions. Take 1 tablet by mouth every Monday and Friday  After dialysis   vancomycin 125 MG capsule Commonly known as: VANCOCIN Take 1 capsule (125 mg total) by mouth 4 (four) times daily for 10 days.      Allergies  Allergen Reactions  . Levofloxacin Other (See Comments)    LOSS OF CONSCIOUSNESS  . Nsaids Other (See Comments)    Patient instructed not to take NSAID's after his lung transplant    Follow-up Information    Wenda Low, MD. Call in 1 day(s).   Specialty: Internal Medicine Why: Please call for a post hospital follow up appointment Contact information: [address], [address] [place] [address] [phone]                The results of significant diagnostics from this hospitalization (including imaging, microbiology, ancillary and laboratory) are listed below for reference.    Significant Diagnostic Studies: DG Chest 2 View  Result Date: 01/07/2020 CLINICAL DATA:  Tachycardia, cough and shortness of breath x1 day. EXAM: CHEST - 2 VIEW COMPARISON:  September 03, 2019 FINDINGS: Stable moderate to marked severity diffusely increased interstitial opacities are again seen throughout the left lung. This is unchanged in appearance when compared to the prior exam. Mild left-sided volume loss is again noted. Very mild, stable right basilar atelectasis is noted. There is no evidence of a  pleural effusion or pneumothorax. The heart size and mediastinal contours are within normal limits. Radiopaque surgical clips are seen overlying the right hilum. Radiopaque vascular stents are again seen just above the aortic arch and below the mid left clavicle. The visualized skeletal structures are unremarkable. IMPRESSION: Stable moderate to marked severity diffuse interstitial fibrosis within the left lung. Electronically Signed   By: Virgina Norfolk M.D.   On: 01/07/2020 22:30   CT Angio Chest PE W and/or Wo Contrast  Result Date: 01/08/2020 CLINICAL DATA:  Shortness of breath; diarrhea EXAM: CT ANGIOGRAPHY CHEST CT ABDOMEN AND PELVIS WITH CONTRAST TECHNIQUE: Multidetector CT imaging of the chest was performed using the standard protocol during bolus administration of intravenous contrast. Multiplanar CT image reconstructions and MIPs were obtained to evaluate the vascular anatomy. Multidetector CT imaging of the abdomen and pelvis  was performed using the standard protocol during bolus administration of intravenous contrast. CONTRAST:  127m OMNIPAQUE IOHEXOL 350 MG/ML SOLN COMPARISON:  Chest radiograph January 07, 2020;. CT angiogram chest September 03, 2019; CT abdomen and pelvis September 02, 2015 FINDINGS: CTA CHEST FINDINGS Cardiovascular: No demonstrable pulmonary embolus. No appreciable thoracic aortic aneurysm or dissection. There are scattered foci of calcification in visualized great vessels as well as in the aorta. Note that there is a stent in the left innominate vein. There is also a stent in the left axillary vein. There are multiple foci of coronary artery calcification. There is left ventricular hypertrophy. No pericardial thickening or effusion. The main pulmonary outflow tract measures 3.2 cm in diameter, prominent. Mediastinum/Nodes: Thyroid appears unremarkable. No appreciable thoracic adenopathy. No esophageal lesions are evident. Lungs/Pleura: There is fibrosis throughout most of the left  lung, similar to prior study. There is atelectatic change in the right base. There is no new opacity to suggest edema or consolidation. No evident pleural effusion. Varicoid bronchiectatic change noted throughout the left lung. Note that there is left lung volume loss. Musculoskeletal: There are foci of degenerative change in the thoracic spine. No blastic or lytic bone lesions. No chest wall lesions appreciable. Review of the MIP images confirms the above findings. CT ABDOMEN and PELVIS FINDINGS Hepatobiliary: Liver contour is subtly lobular with question of a degree of underlying hepatic cirrhosis. No focal liver lesions are appreciable. There are apparent small gallstones within the gallbladder. The gallbladder wall does not appear appreciably thickened. There is no biliary duct dilatation. Pancreas: There is no evident pancreatic mass or inflammatory focus. Spleen: No splenic lesions are appreciable. Adrenals/Urinary Tract: Adrenals bilaterally appear normal. There is no evident renal mass or hydronephrosis on either side. There is a 2 mm calculus in the mid right kidney. There are several adjacent calculi in the lower pole the right kidney measuring between 1 and 4 mm in size. There is a 2 mm calculus in the mid left kidney. There is no appreciable ureteral calculus on either side. Urinary bladder wall is diffusely thickened. Note that urinary bladder is nearly empty at this time. Stomach/Bowel: There are diverticula in the descending colon and sigmoid colon without wall thickening or changes suggesting diverticulitis. Elsewhere there is no appreciable bowel wall mesenteric thickening. No evident bowel obstruction. The terminal ileum appears normal. There is no appreciable free air or portal venous air. Vascular/Lymphatic: No abdominal aortic aneurysm. There is extensive aortic and iliac artery atherosclerosis. There is also extensive calcification in more distal pelvic arterial vessels. Major venous structures  appear patent. There is no appreciable adenopathy in the abdomen or pelvis. Reproductive: Prostate has a somewhat inhomogeneous echotexture with prominence of the prostate. A well-defined prostatic mass is not seen by CT. The seminal vesicles appear unremarkable. Other: The appendix appears normal. No evident abscess or ascites in the abdomen or pelvis. There is mild fat in the umbilicus. Musculoskeletal: There is degenerative change in the lumbar spine. No blastic or lytic bone lesions. No intramuscular lesions are evident. Review of the MIP images confirms the above findings. IMPRESSION: CT angiogram chest: 1. No demonstrable pulmonary embolus. No thoracic aortic aneurysm or dissection. There is aortic atherosclerosis as well as foci of great vessel and coronary artery calcification. Stents in the left innominate and axillary veins noted. 2. Widespread fibrosis with volume loss throughout the left lung. Varicoid bronchiectatic change throughout the left lung, stable. Right lung clear except for scattered areas of atelectatic change. 3. Prominence of  the main pulmonary outflow tract, a finding indicative of pulmonary arterial hypertension. 4.  No adenopathy. CT abdomen and pelvis: 1. Liver contour is subtly lobular. Question a degree of underlying hepatic cirrhosis. No focal liver lesions are appreciable. 2.  Small gallstones.  No gallbladder wall thickening evident. 3. Left colonic diverticula without diverticulitis. No bowel wall thickening or bowel obstruction. No abscess in the abdomen or pelvis. Appendix appears normal. 4. Nonobstructing calculi in each kidney. No hydronephrosis or ureteral calculus on either side. Generalized urinary bladder wall thickening, likely due to a degree of cystitis. 5. Somewhat inhomogeneous attenuation of the prostate. Significance of this finding uncertain. Clinical assessment of prostate and appropriate laboratory correlation advised in this regard. 6. Aortic Atherosclerosis  (ICD10-I70.0). There is extensive pelvic arterial vascular calcification bilaterally. Electronically Signed   By: Lowella Grip III M.D.   On: 01/08/2020 10:14   CT ABDOMEN PELVIS W CONTRAST  Result Date: 01/08/2020 CLINICAL DATA:  Shortness of breath; diarrhea EXAM: CT ANGIOGRAPHY CHEST CT ABDOMEN AND PELVIS WITH CONTRAST TECHNIQUE: Multidetector CT imaging of the chest was performed using the standard protocol during bolus administration of intravenous contrast. Multiplanar CT image reconstructions and MIPs were obtained to evaluate the vascular anatomy. Multidetector CT imaging of the abdomen and pelvis was performed using the standard protocol during bolus administration of intravenous contrast. CONTRAST:  135m OMNIPAQUE IOHEXOL 350 MG/ML SOLN COMPARISON:  Chest radiograph January 07, 2020;. CT angiogram chest September 03, 2019; CT abdomen and pelvis September 02, 2015 FINDINGS: CTA CHEST FINDINGS Cardiovascular: No demonstrable pulmonary embolus. No appreciable thoracic aortic aneurysm or dissection. There are scattered foci of calcification in visualized great vessels as well as in the aorta. Note that there is a stent in the left innominate vein. There is also a stent in the left axillary vein. There are multiple foci of coronary artery calcification. There is left ventricular hypertrophy. No pericardial thickening or effusion. The main pulmonary outflow tract measures 3.2 cm in diameter, prominent. Mediastinum/Nodes: Thyroid appears unremarkable. No appreciable thoracic adenopathy. No esophageal lesions are evident. Lungs/Pleura: There is fibrosis throughout most of the left lung, similar to prior study. There is atelectatic change in the right base. There is no new opacity to suggest edema or consolidation. No evident pleural effusion. Varicoid bronchiectatic change noted throughout the left lung. Note that there is left lung volume loss. Musculoskeletal: There are foci of degenerative change in the thoracic  spine. No blastic or lytic bone lesions. No chest wall lesions appreciable. Review of the MIP images confirms the above findings. CT ABDOMEN and PELVIS FINDINGS Hepatobiliary: Liver contour is subtly lobular with question of a degree of underlying hepatic cirrhosis. No focal liver lesions are appreciable. There are apparent small gallstones within the gallbladder. The gallbladder wall does not appear appreciably thickened. There is no biliary duct dilatation. Pancreas: There is no evident pancreatic mass or inflammatory focus. Spleen: No splenic lesions are appreciable. Adrenals/Urinary Tract: Adrenals bilaterally appear normal. There is no evident renal mass or hydronephrosis on either side. There is a 2 mm calculus in the mid right kidney. There are several adjacent calculi in the lower pole the right kidney measuring between 1 and 4 mm in size. There is a 2 mm calculus in the mid left kidney. There is no appreciable ureteral calculus on either side. Urinary bladder wall is diffusely thickened. Note that urinary bladder is nearly empty at this time. Stomach/Bowel: There are diverticula in the descending colon and sigmoid colon without wall thickening or  changes suggesting diverticulitis. Elsewhere there is no appreciable bowel wall mesenteric thickening. No evident bowel obstruction. The terminal ileum appears normal. There is no appreciable free air or portal venous air. Vascular/Lymphatic: No abdominal aortic aneurysm. There is extensive aortic and iliac artery atherosclerosis. There is also extensive calcification in more distal pelvic arterial vessels. Major venous structures appear patent. There is no appreciable adenopathy in the abdomen or pelvis. Reproductive: Prostate has a somewhat inhomogeneous echotexture with prominence of the prostate. A well-defined prostatic mass is not seen by CT. The seminal vesicles appear unremarkable. Other: The appendix appears normal. No evident abscess or ascites in the  abdomen or pelvis. There is mild fat in the umbilicus. Musculoskeletal: There is degenerative change in the lumbar spine. No blastic or lytic bone lesions. No intramuscular lesions are evident. Review of the MIP images confirms the above findings. IMPRESSION: CT angiogram chest: 1. No demonstrable pulmonary embolus. No thoracic aortic aneurysm or dissection. There is aortic atherosclerosis as well as foci of great vessel and coronary artery calcification. Stents in the left innominate and axillary veins noted. 2. Widespread fibrosis with volume loss throughout the left lung. Varicoid bronchiectatic change throughout the left lung, stable. Right lung clear except for scattered areas of atelectatic change. 3. Prominence of the main pulmonary outflow tract, a finding indicative of pulmonary arterial hypertension. 4.  No adenopathy. CT abdomen and pelvis: 1. Liver contour is subtly lobular. Question a degree of underlying hepatic cirrhosis. No focal liver lesions are appreciable. 2.  Small gallstones.  No gallbladder wall thickening evident. 3. Left colonic diverticula without diverticulitis. No bowel wall thickening or bowel obstruction. No abscess in the abdomen or pelvis. Appendix appears normal. 4. Nonobstructing calculi in each kidney. No hydronephrosis or ureteral calculus on either side. Generalized urinary bladder wall thickening, likely due to a degree of cystitis. 5. Somewhat inhomogeneous attenuation of the prostate. Significance of this finding uncertain. Clinical assessment of prostate and appropriate laboratory correlation advised in this regard. 6. Aortic Atherosclerosis (ICD10-I70.0). There is extensive pelvic arterial vascular calcification bilaterally. Electronically Signed   By: Lowella Grip III M.D.   On: 01/08/2020 10:14   US Abdomen Limited RUQ  Result Date: 01/02/2020 CLINICAL DATA:  Elevated LFTs EXAM: ULTRASOUND ABDOMEN LIMITED RIGHT UPPER QUADRANT COMPARISON:  None. FINDINGS:  Gallbladder: Partially distended with echogenic foci along the gallbladder wall. These correspond with adherent small stones seen on recent CT examination. No wall thickening or pericholecystic fluid is noted. Negative sonographic Murphy's sign is noted. Common bile duct: Diameter: 3.5 mm. Liver: Mild heterogeneity without focal mass. Very mild nodularity is noted. Portal vein is patent on color Doppler imaging with normal direction of blood flow towards the liver. Other: None. IMPRESSION: Early changes of cirrhosis. Adherent gallstones within the gallbladder without complicating factors. Electronically Signed   By: Inez Catalina M.D.   On: 01/02/2020 22:41    Microbiology: Recent Results (from the past 240 hour(s))  Culture, blood (Routine x 2)     Status: None   Collection Time: 01/07/20  7:15 PM   Specimen: BLOOD RIGHT ARM  Result Value Ref Range Status   Specimen Description BLOOD RIGHT ARM  Final   Special Requests   Final    BOTTLES DRAWN AEROBIC AND ANAEROBIC Blood Culture results may not be optimal due to an excessive volume of blood received in culture bottles   Culture   Final    NO GROWTH 5 DAYS Performed at [facility], [place] [address],  [place], [place] [address]    Report Status 01/12/2020 FINAL  Final  Culture, blood (Routine x 2)     Status: None   Collection Time: 01/07/20  7:30 PM   Specimen: BLOOD RIGHT HAND  Result Value Ref Range Status   Specimen Description BLOOD RIGHT HAND  Final   Special Requests   Final    BOTTLES DRAWN AEROBIC ONLY Blood Culture adequate volume   Culture   Final    NO GROWTH 5 DAYS Performed at [facility], [place] [address] [place], [place] [address]    Report Status 01/12/2020 FINAL  Final  SARS Coronavirus 2 by RT PCR (hospital order, performed in [facility] hospital lab) Nasopharyngeal Nasopharyngeal Swab     Status: None   Collection Time: 01/07/20  7:43 PM   Specimen: Nasopharyngeal Swab  Result Value Ref Range Status    SARS Coronavirus 2 NEGATIVE NEGATIVE Final    Comment: (NOTE) SARS-CoV-2 target nucleic acids are NOT DETECTED.  The SARS-CoV-2 RNA is generally detectable in upper and lower respiratory specimens during the acute phase of infection. The lowest concentration of SARS-CoV-2 viral copies this assay can detect is 250 copies / mL. A negative result does not preclude SARS-CoV-2 infection and should not be used as the sole basis for treatment or other patient management decisions.  A negative result may occur with improper specimen collection / handling, submission of specimen other than nasopharyngeal swab, presence of viral mutation(s) within the areas targeted by this assay, and inadequate number of viral copies (<250 copies / mL). A negative result must be combined with clinical observations, patient history, and epidemiological information.  Fact Sheet for Patients:   StrictlyIdeas.no  Fact Sheet for Healthcare Providers: BankingDealers.co.za  This test is not yet approved or  cleared by the [place] FDA and has been authorized for detection and/or diagnosis of SARS-CoV-2 by FDA under an Emergency Use Authorization (EUA).  This EUA will remain in effect (meaning this test can be used) for the duration of the COVID-19 declaration under Section 564(b)(1) of the Act, 21 U.S.C. section 360bbb-3(b)(1), unless the authorization is terminated or revoked sooner.  Performed at [facility], [place] [address], [place], [place] [address]   Resp Panel by RT PCR (RSV, Flu A&B, Covid) - Nasopharyngeal Swab     Status: None   Collection Time: 01/08/20  7:16 AM   Specimen: Nasopharyngeal Swab  Result Value Ref Range Status   SARS Coronavirus 2 by RT PCR NEGATIVE NEGATIVE Final    Comment: (NOTE) SARS-CoV-2 target nucleic acids are NOT DETECTED.  The SARS-CoV-2 RNA is generally detectable in upper respiratoy specimens during the acute phase  of infection. The lowest concentration of SARS-CoV-2 viral copies this assay can detect is 131 copies/mL. A negative result does not preclude SARS-Cov-2 infection and should not be used as the sole basis for treatment or other patient management decisions. A negative result may occur with  improper specimen collection/handling, submission of specimen other than nasopharyngeal swab, presence of viral mutation(s) within the areas targeted by this assay, and inadequate number of viral copies (<131 copies/mL). A negative result must be combined with clinical observations, patient history, and epidemiological information. The expected result is Negative.  Fact Sheet for Patients:  PinkCheek.be  Fact Sheet for Healthcare Providers:  GravelBags.it  This test is no t yet approved or cleared by the [place] FDA and  has been authorized for detection and/or diagnosis of SARS-CoV-2 by FDA under  an Emergency Use Authorization (EUA). This EUA will remain  in effect (meaning this test can be used) for the duration of the COVID-19 declaration under Section 564(b)(1) of the Act, 21 U.S.C. section 360bbb-3(b)(1), unless the authorization is terminated or revoked sooner.     Influenza A by PCR NEGATIVE NEGATIVE Final   Influenza B by PCR NEGATIVE NEGATIVE Final    Comment: (NOTE) The Xpert Xpress SARS-CoV-2/FLU/RSV assay is intended as an aid in  the diagnosis of influenza from Nasopharyngeal swab specimens and  should not be used as a sole basis for treatment. Nasal washings and  aspirates are unacceptable for Xpert Xpress SARS-CoV-2/FLU/RSV  testing.  Fact Sheet for Patients: PinkCheek.be  Fact Sheet for Healthcare Providers: GravelBags.it  This test is not yet approved or cleared by the [place] FDA and  has been authorized for detection and/or diagnosis of  SARS-CoV-2 by  FDA under an Emergency Use Authorization (EUA). This EUA will remain  in effect (meaning this test can be used) for the duration of the  Covid-19 declaration under Section 564(b)(1) of the Act, 21  U.S.C. section 360bbb-3(b)(1), unless the authorization is  terminated or revoked.    Respiratory Syncytial Virus by PCR NEGATIVE NEGATIVE Final    Comment: (NOTE) Fact Sheet for Patients: PinkCheek.be  Fact Sheet for Healthcare Providers: GravelBags.it  This test is not yet approved or cleared by the [place] FDA and  has been authorized for detection and/or diagnosis of SARS-CoV-2 by  FDA under an Emergency Use Authorization (EUA). This EUA will remain  in effect (meaning this test can be used) for the duration of the  COVID-19 declaration under Section 564(b)(1) of the Act, 21 U.S.C.  section 360bbb-3(b)(1), unless the authorization is terminated or  revoked. Performed at [facility], [place] [address] [place], [place] [address]   Gastrointestinal Panel by PCR , Stool     Status: None   Collection Time: 01/08/20  7:16 AM   Specimen: Stool  Result Value Ref Range Status   Campylobacter species NOT DETECTED NOT DETECTED Final   Plesimonas shigelloides NOT DETECTED NOT DETECTED Final   Salmonella species NOT DETECTED NOT DETECTED Final   Yersinia enterocolitica NOT DETECTED NOT DETECTED Final   Vibrio species NOT DETECTED NOT DETECTED Final   Vibrio cholerae NOT DETECTED NOT DETECTED Final   Enteroaggregative E coli (EAEC) NOT DETECTED NOT DETECTED Final   Enteropathogenic E coli (EPEC) NOT DETECTED NOT DETECTED Final   Enterotoxigenic E coli (ETEC) NOT DETECTED NOT DETECTED Final   Shiga like toxin producing E coli (STEC) NOT DETECTED NOT DETECTED Final   Shigella/Enteroinvasive E coli (EIEC) NOT DETECTED NOT DETECTED Final   Cryptosporidium NOT DETECTED NOT DETECTED Final   Cyclospora cayetanensis  NOT DETECTED NOT DETECTED Final   Entamoeba histolytica NOT DETECTED NOT DETECTED Final   Giardia lamblia NOT DETECTED NOT DETECTED Final   Adenovirus F40/41 NOT DETECTED NOT DETECTED Final   Astrovirus NOT DETECTED NOT DETECTED Final   Norovirus GI/GII NOT DETECTED NOT DETECTED Final   Rotavirus A NOT DETECTED NOT DETECTED Final   Sapovirus (I, II, IV, and V) NOT DETECTED NOT DETECTED Final    Comment: Performed at [facility], [place]., [place],  [address]  MRSA PCR Screening     Status: None   Collection Time: 01/09/20  8:20 AM   Specimen: Nasal Mucosa; Nasopharyngeal  Result Value Ref Range Status   MRSA by PCR NEGATIVE NEGATIVE Final  Comment:        The GeneXpert MRSA Assay (FDA approved for NASAL specimens only), is one component of a comprehensive MRSA colonization surveillance program. It is not intended to diagnose MRSA infection nor to guide or monitor treatment for MRSA infections. Performed at [facility], [place] [address], [place], [place] [address]   C Difficile Quick Screen w PCR reflex     Status: Abnormal   Collection Time: 01/09/20 12:23 PM   Specimen: STOOL  Result Value Ref Range Status   C Diff antigen POSITIVE (A) NEGATIVE Final   C Diff toxin POSITIVE (A) NEGATIVE Final   C Diff interpretation Toxin producing C. difficile detected.  Final    Comment: CRITICAL RESULT CALLED TO, READ BACK BY AND VERIFIED WITH: Patrcia Dolly RN 1610 01/09/20 A BROWNING Performed at [facility], [place] [address] [place], [place] [address]      Labs: Basic Metabolic Panel: Recent Labs  Lab 01/07/20 2011 01/08/20 1059 01/09/20 0208 01/09/20 1049 01/10/20 1355 01/13/20 0549  NA 137 134* 136  --  135 140  K 3.7 3.7 3.8  --  3.8 3.8  CL 93* 94* 98  --  97* 102  CO2 '28 27 26  ' --  25 23  GLUCOSE 197* 103* 127*  --  112* 102*  BUN 44* 52* 20  --  14 46*  CREATININE 8.78* 9.50* 4.72*  --  3.43* 8.66*  CALCIUM 8.7* 8.1* 8.3*  --  8.2* 8.7*   MG  --  2.1 1.9  --   --   --   PHOS  --   --   --  2.6 1.4*  --    Liver Function Tests: Recent Labs  Lab 01/07/20 2011 01/08/20 1059 01/09/20 0208 01/10/20 1355 01/13/20 0549  AST 47* 34 43* 37 24  ALT '25 22 22 23 15  ' ALKPHOS 235* 212* 211* 232* 220*  BILITOT 3.5* 2.7* 2.1* 1.5* 0.9  PROT 6.0* 5.5* 5.7* 5.9* 5.5*  ALBUMIN 2.7* 2.4* 2.5* 2.5* 2.4*   No results for input(s): LIPASE, AMYLASE in the last 168 hours. No results for input(s): AMMONIA in the last 168 hours. CBC: Recent Labs  Lab 01/08/20 1059 01/09/20 0208 01/10/20 1355 01/13/20 0549 01/14/20 0133  WBC 11.8* 9.5 7.6 5.7 6.0  NEUTROABS 10.2* 8.5* 5.6 2.8 3.4  HGB 9.5* 10.4* 9.6* 10.1* 10.2*  HCT 30.4* 32.0* 30.5* 32.0* 31.7*  MCV 108.2* 105.6* 106.3* 104.2* 104.6*  PLT 125* 145* 133* 171 209   Cardiac Enzymes: No results for input(s): CKTOTAL, CKMB, CKMBINDEX, TROPONINI in the last 168 hours. BNP: BNP (last 3 results) Recent Labs    09/03/19 1745  BNP 324.0*    ProBNP (last 3 results) No results for input(s): PROBNP in the last 8760 hours.  CBG: Recent Labs  Lab 01/13/20 1246 01/13/20 1643 01/13/20 2124 01/14/20 0609 01/14/20 1152  GLUCAP 85 125* 204* 146* 102*       Signed:  Kayleen Memos, MD [facility] 01/14/2020, 1:45 PM

## 2020-01-14 NOTE — Progress Notes (Signed)
[facility] Progress Note   Subjective: feeling better, formed stools per the patinet. No abd pain or fevers.   Objective Vitals:   01/13/20 2205 01/13/20 2343 01/14/20 0500 01/14/20 0746  BP:  103/64  (!) 147/81  Pulse:  70  72  Resp: 19 20  (!) 22  Temp:  98.5 F (36.9 C)  97.8 F (36.6 C)  TempSrc:  Oral  Oral  SpO2:  99%  95%  Weight:   65.8 kg   Height:       Physical Exam General: Chronically ill man, NAD Heart: RRR Lungs: CTA anteriorly Abdomen: soft, non-tender Extremities: Trace LE edema Dialysis Access: LUE AVF + bruit  Additional Objective Labs: Basic Metabolic Panel: Recent Labs  Lab 01/09/20 0208 01/09/20 1049 01/10/20 1355 01/13/20 0549  NA 136  --  135 140  K 3.8  --  3.8 3.8  CL 98  --  97* 102  CO2 26  --  25 23  GLUCOSE 127*  --  112* 102*  BUN 20  --  14 46*  CREATININE 4.72*  --  3.43* 8.66*  CALCIUM 8.3*  --  8.2* 8.7*  PHOS  --  2.6 1.4*  --    Liver Function Tests: Recent Labs  Lab 01/09/20 0208 01/10/20 1355 01/13/20 0549  AST 43* 37 24  ALT 22 23 15   ALKPHOS 211* 232* 220*  BILITOT 2.1* 1.5* 0.9  PROT 5.7* 5.9* 5.5*  ALBUMIN 2.5* 2.5* 2.4*   CBC: Recent Labs  Lab 01/08/20 1059 01/08/20 1059 01/09/20 0208 01/09/20 0208 01/10/20 1355 01/13/20 0549 01/14/20 0133  WBC 11.8*   < > 9.5   < > 7.6 5.7 6.0  NEUTROABS 10.2*   < > 8.5*   < > 5.6 2.8 3.4  HGB 9.5*   < > 10.4*   < > 9.6* 10.1* 10.2*  HCT 30.4*   < > 32.0*   < > 30.5* 32.0* 31.7*  MCV 108.2*  --  105.6*  --  106.3* 104.2* 104.6*  PLT 125*   < > 145*   < > 133* 171 209   < > = values in this interval not displayed.   Blood Culture    Component Value Date/Time   SDES BLOOD RIGHT HAND 01/07/2020 1930   SPECREQUEST  01/07/2020 1930    BOTTLES DRAWN AEROBIC ONLY Blood Culture adequate volume   CULT  01/07/2020 1930    NO GROWTH 5 DAYS Performed at [facility], [place] [address], [place], [place] [address]    REPTSTATUS 01/12/2020 FINAL  01/07/2020 1930   Medications:  . aspirin  81 mg Oral Daily  . azaTHIOprine  50 mg Oral Q M,W,F-HD  . Chlorhexidine Gluconate Cloth  6 each Topical Q0600  . cycloSPORINE  100 mg Oral QHS  . cycloSPORINE  75 mg Oral Daily  . darbepoetin (ARANESP) injection - DIALYSIS  25 mcg Intravenous Q Mon-HD  . dorzolamide-timolol  1 drop Both Eyes BID  . fluticasone  2 spray Each Nare Daily  . heparin  5,000 Units Subcutaneous Q8H  . insulin aspart  0-9 Units Subcutaneous TID AC & HS  . insulin aspart  5 Units Subcutaneous QAC breakfast  . insulin aspart  6 Units Subcutaneous QAC lunch  . insulin aspart  6 Units Subcutaneous QAC supper  . midodrine  10 mg Oral Daily  . multivitamin with minerals  1 tablet Oral Q T,Th,S,Su  . pantoprazole  40 mg Oral BID  .  prednisoLONE acetate  1 drop Both Eyes BID  . predniSONE  5 mg Oral Q breakfast  . pyridOXINE  100 mg Oral Daily  . sulfamethoxazole-trimethoprim  1 tablet Oral Q M,W,F-2000  . valGANciclovir  450 mg Oral Once per day on Mon Fri  . vancomycin  125 mg Oral Q6H    Dialysis Orders: MWF-NW 4hrs, BFR400, BCW888, EDW 67.5kg,2K/2Ca Access:LU AVF No Heparin Hectorol77mcg IV qHD   Assessment/Plan: 1. Sepsissecondary to severe C Difficile-getting oral Vanc + IV Flagyl. Primary has discussed case with [facility] transplant MD. Diarrhea resolving and pt is going home today.  2. Hypoxia-Intermittent O2 use during admission. 3. Diarrhea -See # 1.CT abd/pelvis with no acute abnormalities. GI panelnegative.C Diff +.  4. Hepatic cirrhosis - New finding on [place] completed last week due to elevated labs. Per primary 5. ESRD- On HD MWF.  6. Hypotension/volume- chronic hypotension on midodrine. Under dry wt today 1-2kg, lower at dc.  7. Anemiaof CKD-Hgb 10.1, due for ESA today. 8. Secondary Hyperparathyroidism - Phos very low 8/6 - binders held. ContinueVDRA.  9. Nutrition- Renal diet w/fluid restrictions 10. 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 Per primary 11. DMT2 -per primary 12. GERD - Hx bleeding duodenal ulcer 13. Dispo - for dc home today  Kelly Splinter, MD 01/14/2020, 10:28 AM

## 2020-01-15 DIAGNOSIS — A419 Sepsis, unspecified organism: Secondary | ICD-10-CM | POA: Diagnosis not present

## 2020-01-15 DIAGNOSIS — T8579XA Infection and inflammatory reaction due to other internal prosthetic devices, implants and grafts, initial encounter: Secondary | ICD-10-CM | POA: Diagnosis not present

## 2020-01-15 DIAGNOSIS — E1129 Type 2 diabetes mellitus with other diabetic kidney complication: Secondary | ICD-10-CM | POA: Diagnosis not present

## 2020-01-15 DIAGNOSIS — E876 Hypokalemia: Secondary | ICD-10-CM | POA: Diagnosis not present

## 2020-01-15 DIAGNOSIS — N2581 Secondary hyperparathyroidism of renal origin: Secondary | ICD-10-CM | POA: Diagnosis not present

## 2020-01-15 DIAGNOSIS — N186 End stage renal disease: Secondary | ICD-10-CM | POA: Diagnosis not present

## 2020-01-15 DIAGNOSIS — R7401 Elevation of levels of liver transaminase levels: Secondary | ICD-10-CM | POA: Diagnosis not present

## 2020-01-15 DIAGNOSIS — Z992 Dependence on renal dialysis: Secondary | ICD-10-CM | POA: Diagnosis not present

## 2020-01-16 DIAGNOSIS — Z298 Encounter for other specified prophylactic measures: Secondary | ICD-10-CM | POA: Diagnosis not present

## 2020-01-16 DIAGNOSIS — Z79899 Other long term (current) drug therapy: Secondary | ICD-10-CM | POA: Diagnosis not present

## 2020-01-16 DIAGNOSIS — Z942 Lung transplant status: Secondary | ICD-10-CM | POA: Diagnosis not present

## 2020-01-16 DIAGNOSIS — T86819 Unspecified complication of lung transplant: Secondary | ICD-10-CM | POA: Diagnosis not present

## 2020-01-16 DIAGNOSIS — Z5181 Encounter for therapeutic drug level monitoring: Secondary | ICD-10-CM | POA: Diagnosis not present

## 2020-01-17 DIAGNOSIS — E876 Hypokalemia: Secondary | ICD-10-CM | POA: Diagnosis not present

## 2020-01-17 DIAGNOSIS — N2581 Secondary hyperparathyroidism of renal origin: Secondary | ICD-10-CM | POA: Diagnosis not present

## 2020-01-17 DIAGNOSIS — T8579XA Infection and inflammatory reaction due to other internal prosthetic devices, implants and grafts, initial encounter: Secondary | ICD-10-CM | POA: Diagnosis not present

## 2020-01-17 DIAGNOSIS — Z992 Dependence on renal dialysis: Secondary | ICD-10-CM | POA: Diagnosis not present

## 2020-01-17 DIAGNOSIS — N186 End stage renal disease: Secondary | ICD-10-CM | POA: Diagnosis not present

## 2020-01-20 DIAGNOSIS — E876 Hypokalemia: Secondary | ICD-10-CM | POA: Diagnosis not present

## 2020-01-20 DIAGNOSIS — Z992 Dependence on renal dialysis: Secondary | ICD-10-CM | POA: Diagnosis not present

## 2020-01-20 DIAGNOSIS — T8579XA Infection and inflammatory reaction due to other internal prosthetic devices, implants and grafts, initial encounter: Secondary | ICD-10-CM | POA: Diagnosis not present

## 2020-01-20 DIAGNOSIS — N186 End stage renal disease: Secondary | ICD-10-CM | POA: Diagnosis not present

## 2020-01-20 DIAGNOSIS — N2581 Secondary hyperparathyroidism of renal origin: Secondary | ICD-10-CM | POA: Diagnosis not present

## 2020-01-21 DIAGNOSIS — M542 Cervicalgia: Secondary | ICD-10-CM | POA: Diagnosis not present

## 2020-01-21 DIAGNOSIS — M6281 Muscle weakness (generalized): Secondary | ICD-10-CM | POA: Diagnosis not present

## 2020-01-21 DIAGNOSIS — G629 Polyneuropathy, unspecified: Secondary | ICD-10-CM | POA: Diagnosis not present

## 2020-01-21 DIAGNOSIS — R269 Unspecified abnormalities of gait and mobility: Secondary | ICD-10-CM | POA: Diagnosis not present

## 2020-01-21 DIAGNOSIS — M545 Low back pain: Secondary | ICD-10-CM | POA: Diagnosis not present

## 2020-01-21 DIAGNOSIS — A0472 Enterocolitis due to Clostridium difficile, not specified as recurrent: Secondary | ICD-10-CM | POA: Diagnosis not present

## 2020-01-21 DIAGNOSIS — M25512 Pain in left shoulder: Secondary | ICD-10-CM | POA: Diagnosis not present

## 2020-01-22 DIAGNOSIS — N186 End stage renal disease: Secondary | ICD-10-CM | POA: Diagnosis not present

## 2020-01-22 DIAGNOSIS — E876 Hypokalemia: Secondary | ICD-10-CM | POA: Diagnosis not present

## 2020-01-22 DIAGNOSIS — T8579XA Infection and inflammatory reaction due to other internal prosthetic devices, implants and grafts, initial encounter: Secondary | ICD-10-CM | POA: Diagnosis not present

## 2020-01-22 DIAGNOSIS — Z992 Dependence on renal dialysis: Secondary | ICD-10-CM | POA: Diagnosis not present

## 2020-01-22 DIAGNOSIS — N2581 Secondary hyperparathyroidism of renal origin: Secondary | ICD-10-CM | POA: Diagnosis not present

## 2020-01-23 DIAGNOSIS — R269 Unspecified abnormalities of gait and mobility: Secondary | ICD-10-CM | POA: Diagnosis not present

## 2020-01-23 DIAGNOSIS — M545 Low back pain: Secondary | ICD-10-CM | POA: Diagnosis not present

## 2020-01-23 DIAGNOSIS — M6281 Muscle weakness (generalized): Secondary | ICD-10-CM | POA: Diagnosis not present

## 2020-01-23 DIAGNOSIS — M542 Cervicalgia: Secondary | ICD-10-CM | POA: Diagnosis not present

## 2020-01-24 DIAGNOSIS — N2581 Secondary hyperparathyroidism of renal origin: Secondary | ICD-10-CM | POA: Diagnosis not present

## 2020-01-24 DIAGNOSIS — Z298 Encounter for other specified prophylactic measures: Secondary | ICD-10-CM | POA: Diagnosis not present

## 2020-01-24 DIAGNOSIS — Z79899 Other long term (current) drug therapy: Secondary | ICD-10-CM | POA: Diagnosis not present

## 2020-01-24 DIAGNOSIS — Z942 Lung transplant status: Secondary | ICD-10-CM | POA: Diagnosis not present

## 2020-01-24 DIAGNOSIS — Z5181 Encounter for therapeutic drug level monitoring: Secondary | ICD-10-CM | POA: Diagnosis not present

## 2020-01-24 DIAGNOSIS — T8579XA Infection and inflammatory reaction due to other internal prosthetic devices, implants and grafts, initial encounter: Secondary | ICD-10-CM | POA: Diagnosis not present

## 2020-01-24 DIAGNOSIS — E876 Hypokalemia: Secondary | ICD-10-CM | POA: Diagnosis not present

## 2020-01-24 DIAGNOSIS — T86819 Unspecified complication of lung transplant: Secondary | ICD-10-CM | POA: Diagnosis not present

## 2020-01-24 DIAGNOSIS — Z992 Dependence on renal dialysis: Secondary | ICD-10-CM | POA: Diagnosis not present

## 2020-01-24 DIAGNOSIS — N186 End stage renal disease: Secondary | ICD-10-CM | POA: Diagnosis not present

## 2020-01-27 DIAGNOSIS — N186 End stage renal disease: Secondary | ICD-10-CM | POA: Diagnosis not present

## 2020-01-27 DIAGNOSIS — E876 Hypokalemia: Secondary | ICD-10-CM | POA: Diagnosis not present

## 2020-01-27 DIAGNOSIS — N2581 Secondary hyperparathyroidism of renal origin: Secondary | ICD-10-CM | POA: Diagnosis not present

## 2020-01-27 DIAGNOSIS — Z992 Dependence on renal dialysis: Secondary | ICD-10-CM | POA: Diagnosis not present

## 2020-01-27 DIAGNOSIS — T8579XA Infection and inflammatory reaction due to other internal prosthetic devices, implants and grafts, initial encounter: Secondary | ICD-10-CM | POA: Diagnosis not present

## 2020-01-28 DIAGNOSIS — M542 Cervicalgia: Secondary | ICD-10-CM | POA: Diagnosis not present

## 2020-01-28 DIAGNOSIS — H04223 Epiphora due to insufficient drainage, bilateral lacrimal glands: Secondary | ICD-10-CM | POA: Diagnosis not present

## 2020-01-28 DIAGNOSIS — E119 Type 2 diabetes mellitus without complications: Secondary | ICD-10-CM | POA: Diagnosis not present

## 2020-01-28 DIAGNOSIS — M6281 Muscle weakness (generalized): Secondary | ICD-10-CM | POA: Diagnosis not present

## 2020-01-28 DIAGNOSIS — R269 Unspecified abnormalities of gait and mobility: Secondary | ICD-10-CM | POA: Diagnosis not present

## 2020-01-28 DIAGNOSIS — H4063X0 Glaucoma secondary to drugs, bilateral, stage unspecified: Secondary | ICD-10-CM | POA: Diagnosis not present

## 2020-01-28 DIAGNOSIS — H11823 Conjunctivochalasis, bilateral: Secondary | ICD-10-CM | POA: Diagnosis not present

## 2020-01-28 DIAGNOSIS — M545 Low back pain: Secondary | ICD-10-CM | POA: Diagnosis not present

## 2020-01-28 DIAGNOSIS — H04123 Dry eye syndrome of bilateral lacrimal glands: Secondary | ICD-10-CM | POA: Diagnosis not present

## 2020-01-28 DIAGNOSIS — H4063X2 Glaucoma secondary to drugs, bilateral, moderate stage: Secondary | ICD-10-CM | POA: Diagnosis not present

## 2020-01-29 DIAGNOSIS — N186 End stage renal disease: Secondary | ICD-10-CM | POA: Diagnosis not present

## 2020-01-29 DIAGNOSIS — E876 Hypokalemia: Secondary | ICD-10-CM | POA: Diagnosis not present

## 2020-01-29 DIAGNOSIS — N2581 Secondary hyperparathyroidism of renal origin: Secondary | ICD-10-CM | POA: Diagnosis not present

## 2020-01-29 DIAGNOSIS — Z992 Dependence on renal dialysis: Secondary | ICD-10-CM | POA: Diagnosis not present

## 2020-01-29 DIAGNOSIS — T8579XA Infection and inflammatory reaction due to other internal prosthetic devices, implants and grafts, initial encounter: Secondary | ICD-10-CM | POA: Diagnosis not present

## 2020-01-30 DIAGNOSIS — M6281 Muscle weakness (generalized): Secondary | ICD-10-CM | POA: Diagnosis not present

## 2020-01-30 DIAGNOSIS — M542 Cervicalgia: Secondary | ICD-10-CM | POA: Diagnosis not present

## 2020-01-30 DIAGNOSIS — G4700 Insomnia, unspecified: Secondary | ICD-10-CM | POA: Diagnosis not present

## 2020-01-30 DIAGNOSIS — M545 Low back pain: Secondary | ICD-10-CM | POA: Diagnosis not present

## 2020-01-30 DIAGNOSIS — I5032 Chronic diastolic (congestive) heart failure: Secondary | ICD-10-CM | POA: Diagnosis not present

## 2020-01-30 DIAGNOSIS — E782 Mixed hyperlipidemia: Secondary | ICD-10-CM | POA: Diagnosis not present

## 2020-01-30 DIAGNOSIS — I10 Essential (primary) hypertension: Secondary | ICD-10-CM | POA: Diagnosis not present

## 2020-01-30 DIAGNOSIS — I270 Primary pulmonary hypertension: Secondary | ICD-10-CM | POA: Diagnosis not present

## 2020-01-30 DIAGNOSIS — H409 Unspecified glaucoma: Secondary | ICD-10-CM | POA: Diagnosis not present

## 2020-01-30 DIAGNOSIS — F329 Major depressive disorder, single episode, unspecified: Secondary | ICD-10-CM | POA: Diagnosis not present

## 2020-01-30 DIAGNOSIS — R269 Unspecified abnormalities of gait and mobility: Secondary | ICD-10-CM | POA: Diagnosis not present

## 2020-01-30 DIAGNOSIS — N186 End stage renal disease: Secondary | ICD-10-CM | POA: Diagnosis not present

## 2020-01-30 DIAGNOSIS — E1122 Type 2 diabetes mellitus with diabetic chronic kidney disease: Secondary | ICD-10-CM | POA: Diagnosis not present

## 2020-01-31 DIAGNOSIS — T8579XA Infection and inflammatory reaction due to other internal prosthetic devices, implants and grafts, initial encounter: Secondary | ICD-10-CM | POA: Diagnosis not present

## 2020-01-31 DIAGNOSIS — E876 Hypokalemia: Secondary | ICD-10-CM | POA: Diagnosis not present

## 2020-01-31 DIAGNOSIS — Z992 Dependence on renal dialysis: Secondary | ICD-10-CM | POA: Diagnosis not present

## 2020-01-31 DIAGNOSIS — N186 End stage renal disease: Secondary | ICD-10-CM | POA: Diagnosis not present

## 2020-01-31 DIAGNOSIS — N2581 Secondary hyperparathyroidism of renal origin: Secondary | ICD-10-CM | POA: Diagnosis not present

## 2020-02-01 DIAGNOSIS — Z23 Encounter for immunization: Secondary | ICD-10-CM | POA: Diagnosis not present

## 2020-02-03 DIAGNOSIS — N186 End stage renal disease: Secondary | ICD-10-CM | POA: Diagnosis not present

## 2020-02-03 DIAGNOSIS — T8579XA Infection and inflammatory reaction due to other internal prosthetic devices, implants and grafts, initial encounter: Secondary | ICD-10-CM | POA: Diagnosis not present

## 2020-02-03 DIAGNOSIS — N2581 Secondary hyperparathyroidism of renal origin: Secondary | ICD-10-CM | POA: Diagnosis not present

## 2020-02-03 DIAGNOSIS — Z992 Dependence on renal dialysis: Secondary | ICD-10-CM | POA: Diagnosis not present

## 2020-02-03 DIAGNOSIS — E876 Hypokalemia: Secondary | ICD-10-CM | POA: Diagnosis not present

## 2020-02-04 DIAGNOSIS — Z713 Dietary counseling and surveillance: Secondary | ICD-10-CM | POA: Diagnosis not present

## 2020-02-04 DIAGNOSIS — Z992 Dependence on renal dialysis: Secondary | ICD-10-CM | POA: Diagnosis not present

## 2020-02-04 DIAGNOSIS — I120 Hypertensive chronic kidney disease with stage 5 chronic kidney disease or end stage renal disease: Secondary | ICD-10-CM | POA: Diagnosis not present

## 2020-02-04 DIAGNOSIS — E1122 Type 2 diabetes mellitus with diabetic chronic kidney disease: Secondary | ICD-10-CM | POA: Diagnosis not present

## 2020-02-04 DIAGNOSIS — N186 End stage renal disease: Secondary | ICD-10-CM | POA: Diagnosis not present

## 2020-02-04 DIAGNOSIS — Z794 Long term (current) use of insulin: Secondary | ICD-10-CM | POA: Diagnosis not present

## 2020-02-05 DIAGNOSIS — D631 Anemia in chronic kidney disease: Secondary | ICD-10-CM | POA: Diagnosis not present

## 2020-02-05 DIAGNOSIS — N186 End stage renal disease: Secondary | ICD-10-CM | POA: Diagnosis not present

## 2020-02-05 DIAGNOSIS — N2581 Secondary hyperparathyroidism of renal origin: Secondary | ICD-10-CM | POA: Diagnosis not present

## 2020-02-05 DIAGNOSIS — E876 Hypokalemia: Secondary | ICD-10-CM | POA: Diagnosis not present

## 2020-02-05 DIAGNOSIS — T86819 Unspecified complication of lung transplant: Secondary | ICD-10-CM | POA: Diagnosis not present

## 2020-02-05 DIAGNOSIS — Z992 Dependence on renal dialysis: Secondary | ICD-10-CM | POA: Diagnosis not present

## 2020-02-05 DIAGNOSIS — D509 Iron deficiency anemia, unspecified: Secondary | ICD-10-CM | POA: Diagnosis not present

## 2020-02-05 DIAGNOSIS — T8579XA Infection and inflammatory reaction due to other internal prosthetic devices, implants and grafts, initial encounter: Secondary | ICD-10-CM | POA: Diagnosis not present

## 2020-02-06 DIAGNOSIS — M6281 Muscle weakness (generalized): Secondary | ICD-10-CM | POA: Diagnosis not present

## 2020-02-06 DIAGNOSIS — R269 Unspecified abnormalities of gait and mobility: Secondary | ICD-10-CM | POA: Diagnosis not present

## 2020-02-06 DIAGNOSIS — M545 Low back pain: Secondary | ICD-10-CM | POA: Diagnosis not present

## 2020-02-06 DIAGNOSIS — M542 Cervicalgia: Secondary | ICD-10-CM | POA: Diagnosis not present

## 2020-02-07 DIAGNOSIS — E876 Hypokalemia: Secondary | ICD-10-CM | POA: Diagnosis not present

## 2020-02-07 DIAGNOSIS — Z992 Dependence on renal dialysis: Secondary | ICD-10-CM | POA: Diagnosis not present

## 2020-02-07 DIAGNOSIS — D509 Iron deficiency anemia, unspecified: Secondary | ICD-10-CM | POA: Diagnosis not present

## 2020-02-07 DIAGNOSIS — N2581 Secondary hyperparathyroidism of renal origin: Secondary | ICD-10-CM | POA: Diagnosis not present

## 2020-02-07 DIAGNOSIS — N186 End stage renal disease: Secondary | ICD-10-CM | POA: Diagnosis not present

## 2020-02-07 DIAGNOSIS — T8579XA Infection and inflammatory reaction due to other internal prosthetic devices, implants and grafts, initial encounter: Secondary | ICD-10-CM | POA: Diagnosis not present

## 2020-02-10 DIAGNOSIS — N2581 Secondary hyperparathyroidism of renal origin: Secondary | ICD-10-CM | POA: Diagnosis not present

## 2020-02-10 DIAGNOSIS — Z992 Dependence on renal dialysis: Secondary | ICD-10-CM | POA: Diagnosis not present

## 2020-02-10 DIAGNOSIS — N186 End stage renal disease: Secondary | ICD-10-CM | POA: Diagnosis not present

## 2020-02-10 DIAGNOSIS — D509 Iron deficiency anemia, unspecified: Secondary | ICD-10-CM | POA: Diagnosis not present

## 2020-02-10 DIAGNOSIS — E876 Hypokalemia: Secondary | ICD-10-CM | POA: Diagnosis not present

## 2020-02-10 DIAGNOSIS — T8579XA Infection and inflammatory reaction due to other internal prosthetic devices, implants and grafts, initial encounter: Secondary | ICD-10-CM | POA: Diagnosis not present

## 2020-02-11 DIAGNOSIS — T86810 Lung transplant rejection: Secondary | ICD-10-CM | POA: Diagnosis not present

## 2020-02-11 DIAGNOSIS — Z992 Dependence on renal dialysis: Secondary | ICD-10-CM | POA: Diagnosis not present

## 2020-02-11 DIAGNOSIS — Z79899 Other long term (current) drug therapy: Secondary | ICD-10-CM | POA: Diagnosis not present

## 2020-02-11 DIAGNOSIS — Z7952 Long term (current) use of systemic steroids: Secondary | ICD-10-CM | POA: Diagnosis not present

## 2020-02-11 DIAGNOSIS — Z8711 Personal history of peptic ulcer disease: Secondary | ICD-10-CM | POA: Diagnosis not present

## 2020-02-11 DIAGNOSIS — N186 End stage renal disease: Secondary | ICD-10-CM | POA: Diagnosis not present

## 2020-02-11 DIAGNOSIS — D849 Immunodeficiency, unspecified: Secondary | ICD-10-CM | POA: Diagnosis not present

## 2020-02-11 DIAGNOSIS — H6120 Impacted cerumen, unspecified ear: Secondary | ICD-10-CM | POA: Diagnosis not present

## 2020-02-11 DIAGNOSIS — D8989 Other specified disorders involving the immune mechanism, not elsewhere classified: Secondary | ICD-10-CM | POA: Diagnosis not present

## 2020-02-11 DIAGNOSIS — R918 Other nonspecific abnormal finding of lung field: Secondary | ICD-10-CM | POA: Diagnosis not present

## 2020-02-11 DIAGNOSIS — Z5181 Encounter for therapeutic drug level monitoring: Secondary | ICD-10-CM | POA: Diagnosis not present

## 2020-02-11 DIAGNOSIS — R9389 Abnormal findings on diagnostic imaging of other specified body structures: Secondary | ICD-10-CM | POA: Diagnosis not present

## 2020-02-11 DIAGNOSIS — Z942 Lung transplant status: Secondary | ICD-10-CM | POA: Diagnosis not present

## 2020-02-11 DIAGNOSIS — G629 Polyneuropathy, unspecified: Secondary | ICD-10-CM | POA: Diagnosis not present

## 2020-02-12 DIAGNOSIS — Z992 Dependence on renal dialysis: Secondary | ICD-10-CM | POA: Diagnosis not present

## 2020-02-12 DIAGNOSIS — D509 Iron deficiency anemia, unspecified: Secondary | ICD-10-CM | POA: Diagnosis not present

## 2020-02-12 DIAGNOSIS — E876 Hypokalemia: Secondary | ICD-10-CM | POA: Diagnosis not present

## 2020-02-12 DIAGNOSIS — T8579XA Infection and inflammatory reaction due to other internal prosthetic devices, implants and grafts, initial encounter: Secondary | ICD-10-CM | POA: Diagnosis not present

## 2020-02-12 DIAGNOSIS — N186 End stage renal disease: Secondary | ICD-10-CM | POA: Diagnosis not present

## 2020-02-12 DIAGNOSIS — N2581 Secondary hyperparathyroidism of renal origin: Secondary | ICD-10-CM | POA: Diagnosis not present

## 2020-02-13 DIAGNOSIS — R269 Unspecified abnormalities of gait and mobility: Secondary | ICD-10-CM | POA: Diagnosis not present

## 2020-02-13 DIAGNOSIS — M545 Low back pain: Secondary | ICD-10-CM | POA: Diagnosis not present

## 2020-02-13 DIAGNOSIS — M542 Cervicalgia: Secondary | ICD-10-CM | POA: Diagnosis not present

## 2020-02-13 DIAGNOSIS — M6281 Muscle weakness (generalized): Secondary | ICD-10-CM | POA: Diagnosis not present

## 2020-02-14 DIAGNOSIS — N186 End stage renal disease: Secondary | ICD-10-CM | POA: Diagnosis not present

## 2020-02-14 DIAGNOSIS — D509 Iron deficiency anemia, unspecified: Secondary | ICD-10-CM | POA: Diagnosis not present

## 2020-02-14 DIAGNOSIS — T8579XA Infection and inflammatory reaction due to other internal prosthetic devices, implants and grafts, initial encounter: Secondary | ICD-10-CM | POA: Diagnosis not present

## 2020-02-14 DIAGNOSIS — Z992 Dependence on renal dialysis: Secondary | ICD-10-CM | POA: Diagnosis not present

## 2020-02-14 DIAGNOSIS — E876 Hypokalemia: Secondary | ICD-10-CM | POA: Diagnosis not present

## 2020-02-14 DIAGNOSIS — N2581 Secondary hyperparathyroidism of renal origin: Secondary | ICD-10-CM | POA: Diagnosis not present

## 2020-02-17 DIAGNOSIS — Z992 Dependence on renal dialysis: Secondary | ICD-10-CM | POA: Diagnosis not present

## 2020-02-17 DIAGNOSIS — D509 Iron deficiency anemia, unspecified: Secondary | ICD-10-CM | POA: Diagnosis not present

## 2020-02-17 DIAGNOSIS — T8579XA Infection and inflammatory reaction due to other internal prosthetic devices, implants and grafts, initial encounter: Secondary | ICD-10-CM | POA: Diagnosis not present

## 2020-02-17 DIAGNOSIS — N186 End stage renal disease: Secondary | ICD-10-CM | POA: Diagnosis not present

## 2020-02-17 DIAGNOSIS — N2581 Secondary hyperparathyroidism of renal origin: Secondary | ICD-10-CM | POA: Diagnosis not present

## 2020-02-17 DIAGNOSIS — E876 Hypokalemia: Secondary | ICD-10-CM | POA: Diagnosis not present

## 2020-02-18 DIAGNOSIS — M542 Cervicalgia: Secondary | ICD-10-CM | POA: Diagnosis not present

## 2020-02-18 DIAGNOSIS — R269 Unspecified abnormalities of gait and mobility: Secondary | ICD-10-CM | POA: Diagnosis not present

## 2020-02-18 DIAGNOSIS — M545 Low back pain: Secondary | ICD-10-CM | POA: Diagnosis not present

## 2020-02-18 DIAGNOSIS — M6281 Muscle weakness (generalized): Secondary | ICD-10-CM | POA: Diagnosis not present

## 2020-02-19 DIAGNOSIS — Z992 Dependence on renal dialysis: Secondary | ICD-10-CM | POA: Diagnosis not present

## 2020-02-19 DIAGNOSIS — N2581 Secondary hyperparathyroidism of renal origin: Secondary | ICD-10-CM | POA: Diagnosis not present

## 2020-02-19 DIAGNOSIS — T8579XA Infection and inflammatory reaction due to other internal prosthetic devices, implants and grafts, initial encounter: Secondary | ICD-10-CM | POA: Diagnosis not present

## 2020-02-19 DIAGNOSIS — E876 Hypokalemia: Secondary | ICD-10-CM | POA: Diagnosis not present

## 2020-02-19 DIAGNOSIS — D509 Iron deficiency anemia, unspecified: Secondary | ICD-10-CM | POA: Diagnosis not present

## 2020-02-19 DIAGNOSIS — N186 End stage renal disease: Secondary | ICD-10-CM | POA: Diagnosis not present

## 2020-02-20 DIAGNOSIS — M545 Low back pain: Secondary | ICD-10-CM | POA: Diagnosis not present

## 2020-02-20 DIAGNOSIS — R269 Unspecified abnormalities of gait and mobility: Secondary | ICD-10-CM | POA: Diagnosis not present

## 2020-02-20 DIAGNOSIS — M542 Cervicalgia: Secondary | ICD-10-CM | POA: Diagnosis not present

## 2020-02-20 DIAGNOSIS — M6281 Muscle weakness (generalized): Secondary | ICD-10-CM | POA: Diagnosis not present

## 2020-02-21 DIAGNOSIS — N186 End stage renal disease: Secondary | ICD-10-CM | POA: Diagnosis not present

## 2020-02-21 DIAGNOSIS — D509 Iron deficiency anemia, unspecified: Secondary | ICD-10-CM | POA: Diagnosis not present

## 2020-02-21 DIAGNOSIS — T8579XA Infection and inflammatory reaction due to other internal prosthetic devices, implants and grafts, initial encounter: Secondary | ICD-10-CM | POA: Diagnosis not present

## 2020-02-21 DIAGNOSIS — Z992 Dependence on renal dialysis: Secondary | ICD-10-CM | POA: Diagnosis not present

## 2020-02-21 DIAGNOSIS — N2581 Secondary hyperparathyroidism of renal origin: Secondary | ICD-10-CM | POA: Diagnosis not present

## 2020-02-21 DIAGNOSIS — E876 Hypokalemia: Secondary | ICD-10-CM | POA: Diagnosis not present

## 2020-02-24 DIAGNOSIS — E876 Hypokalemia: Secondary | ICD-10-CM | POA: Diagnosis not present

## 2020-02-24 DIAGNOSIS — N186 End stage renal disease: Secondary | ICD-10-CM | POA: Diagnosis not present

## 2020-02-24 DIAGNOSIS — T8579XA Infection and inflammatory reaction due to other internal prosthetic devices, implants and grafts, initial encounter: Secondary | ICD-10-CM | POA: Diagnosis not present

## 2020-02-24 DIAGNOSIS — N2581 Secondary hyperparathyroidism of renal origin: Secondary | ICD-10-CM | POA: Diagnosis not present

## 2020-02-24 DIAGNOSIS — Z992 Dependence on renal dialysis: Secondary | ICD-10-CM | POA: Diagnosis not present

## 2020-02-24 DIAGNOSIS — D509 Iron deficiency anemia, unspecified: Secondary | ICD-10-CM | POA: Diagnosis not present

## 2020-02-26 DIAGNOSIS — Z992 Dependence on renal dialysis: Secondary | ICD-10-CM | POA: Diagnosis not present

## 2020-02-26 DIAGNOSIS — E876 Hypokalemia: Secondary | ICD-10-CM | POA: Diagnosis not present

## 2020-02-26 DIAGNOSIS — D509 Iron deficiency anemia, unspecified: Secondary | ICD-10-CM | POA: Diagnosis not present

## 2020-02-26 DIAGNOSIS — N186 End stage renal disease: Secondary | ICD-10-CM | POA: Diagnosis not present

## 2020-02-26 DIAGNOSIS — T8579XA Infection and inflammatory reaction due to other internal prosthetic devices, implants and grafts, initial encounter: Secondary | ICD-10-CM | POA: Diagnosis not present

## 2020-02-26 DIAGNOSIS — N2581 Secondary hyperparathyroidism of renal origin: Secondary | ICD-10-CM | POA: Diagnosis not present

## 2020-02-28 DIAGNOSIS — D509 Iron deficiency anemia, unspecified: Secondary | ICD-10-CM | POA: Diagnosis not present

## 2020-02-28 DIAGNOSIS — N2581 Secondary hyperparathyroidism of renal origin: Secondary | ICD-10-CM | POA: Diagnosis not present

## 2020-02-28 DIAGNOSIS — E876 Hypokalemia: Secondary | ICD-10-CM | POA: Diagnosis not present

## 2020-02-28 DIAGNOSIS — N186 End stage renal disease: Secondary | ICD-10-CM | POA: Diagnosis not present

## 2020-02-28 DIAGNOSIS — Z992 Dependence on renal dialysis: Secondary | ICD-10-CM | POA: Diagnosis not present

## 2020-02-28 DIAGNOSIS — T8579XA Infection and inflammatory reaction due to other internal prosthetic devices, implants and grafts, initial encounter: Secondary | ICD-10-CM | POA: Diagnosis not present

## 2020-03-02 DIAGNOSIS — D509 Iron deficiency anemia, unspecified: Secondary | ICD-10-CM | POA: Diagnosis not present

## 2020-03-02 DIAGNOSIS — N186 End stage renal disease: Secondary | ICD-10-CM | POA: Diagnosis not present

## 2020-03-02 DIAGNOSIS — E876 Hypokalemia: Secondary | ICD-10-CM | POA: Diagnosis not present

## 2020-03-02 DIAGNOSIS — T8579XA Infection and inflammatory reaction due to other internal prosthetic devices, implants and grafts, initial encounter: Secondary | ICD-10-CM | POA: Diagnosis not present

## 2020-03-02 DIAGNOSIS — N2581 Secondary hyperparathyroidism of renal origin: Secondary | ICD-10-CM | POA: Diagnosis not present

## 2020-03-02 DIAGNOSIS — Z992 Dependence on renal dialysis: Secondary | ICD-10-CM | POA: Diagnosis not present

## 2020-03-03 DIAGNOSIS — E1122 Type 2 diabetes mellitus with diabetic chronic kidney disease: Secondary | ICD-10-CM | POA: Diagnosis not present

## 2020-03-03 DIAGNOSIS — I270 Primary pulmonary hypertension: Secondary | ICD-10-CM | POA: Diagnosis not present

## 2020-03-03 DIAGNOSIS — R269 Unspecified abnormalities of gait and mobility: Secondary | ICD-10-CM | POA: Diagnosis not present

## 2020-03-03 DIAGNOSIS — M6281 Muscle weakness (generalized): Secondary | ICD-10-CM | POA: Diagnosis not present

## 2020-03-03 DIAGNOSIS — M542 Cervicalgia: Secondary | ICD-10-CM | POA: Diagnosis not present

## 2020-03-03 DIAGNOSIS — F329 Major depressive disorder, single episode, unspecified: Secondary | ICD-10-CM | POA: Diagnosis not present

## 2020-03-03 DIAGNOSIS — M545 Low back pain: Secondary | ICD-10-CM | POA: Diagnosis not present

## 2020-03-03 DIAGNOSIS — E782 Mixed hyperlipidemia: Secondary | ICD-10-CM | POA: Diagnosis not present

## 2020-03-03 DIAGNOSIS — G4700 Insomnia, unspecified: Secondary | ICD-10-CM | POA: Diagnosis not present

## 2020-03-03 DIAGNOSIS — I5032 Chronic diastolic (congestive) heart failure: Secondary | ICD-10-CM | POA: Diagnosis not present

## 2020-03-03 DIAGNOSIS — N186 End stage renal disease: Secondary | ICD-10-CM | POA: Diagnosis not present

## 2020-03-03 DIAGNOSIS — H409 Unspecified glaucoma: Secondary | ICD-10-CM | POA: Diagnosis not present

## 2020-03-03 DIAGNOSIS — I10 Essential (primary) hypertension: Secondary | ICD-10-CM | POA: Diagnosis not present

## 2020-03-04 DIAGNOSIS — N186 End stage renal disease: Secondary | ICD-10-CM | POA: Diagnosis not present

## 2020-03-04 DIAGNOSIS — D509 Iron deficiency anemia, unspecified: Secondary | ICD-10-CM | POA: Diagnosis not present

## 2020-03-04 DIAGNOSIS — Z992 Dependence on renal dialysis: Secondary | ICD-10-CM | POA: Diagnosis not present

## 2020-03-04 DIAGNOSIS — T8579XA Infection and inflammatory reaction due to other internal prosthetic devices, implants and grafts, initial encounter: Secondary | ICD-10-CM | POA: Diagnosis not present

## 2020-03-04 DIAGNOSIS — E876 Hypokalemia: Secondary | ICD-10-CM | POA: Diagnosis not present

## 2020-03-04 DIAGNOSIS — N2581 Secondary hyperparathyroidism of renal origin: Secondary | ICD-10-CM | POA: Diagnosis not present

## 2020-03-06 DIAGNOSIS — D509 Iron deficiency anemia, unspecified: Secondary | ICD-10-CM | POA: Diagnosis not present

## 2020-03-06 DIAGNOSIS — Z992 Dependence on renal dialysis: Secondary | ICD-10-CM | POA: Diagnosis not present

## 2020-03-06 DIAGNOSIS — E876 Hypokalemia: Secondary | ICD-10-CM | POA: Diagnosis not present

## 2020-03-06 DIAGNOSIS — T86819 Unspecified complication of lung transplant: Secondary | ICD-10-CM | POA: Diagnosis not present

## 2020-03-06 DIAGNOSIS — N2581 Secondary hyperparathyroidism of renal origin: Secondary | ICD-10-CM | POA: Diagnosis not present

## 2020-03-06 DIAGNOSIS — N186 End stage renal disease: Secondary | ICD-10-CM | POA: Diagnosis not present

## 2020-03-06 DIAGNOSIS — T8579XA Infection and inflammatory reaction due to other internal prosthetic devices, implants and grafts, initial encounter: Secondary | ICD-10-CM | POA: Diagnosis not present

## 2020-03-09 DIAGNOSIS — D509 Iron deficiency anemia, unspecified: Secondary | ICD-10-CM | POA: Diagnosis not present

## 2020-03-09 DIAGNOSIS — Z992 Dependence on renal dialysis: Secondary | ICD-10-CM | POA: Diagnosis not present

## 2020-03-09 DIAGNOSIS — T8579XA Infection and inflammatory reaction due to other internal prosthetic devices, implants and grafts, initial encounter: Secondary | ICD-10-CM | POA: Diagnosis not present

## 2020-03-09 DIAGNOSIS — E876 Hypokalemia: Secondary | ICD-10-CM | POA: Diagnosis not present

## 2020-03-09 DIAGNOSIS — N186 End stage renal disease: Secondary | ICD-10-CM | POA: Diagnosis not present

## 2020-03-09 DIAGNOSIS — N2581 Secondary hyperparathyroidism of renal origin: Secondary | ICD-10-CM | POA: Diagnosis not present

## 2020-03-10 DIAGNOSIS — M6281 Muscle weakness (generalized): Secondary | ICD-10-CM | POA: Diagnosis not present

## 2020-03-10 DIAGNOSIS — R2689 Other abnormalities of gait and mobility: Secondary | ICD-10-CM | POA: Diagnosis not present

## 2020-03-10 DIAGNOSIS — M542 Cervicalgia: Secondary | ICD-10-CM | POA: Diagnosis not present

## 2020-03-10 DIAGNOSIS — R269 Unspecified abnormalities of gait and mobility: Secondary | ICD-10-CM | POA: Diagnosis not present

## 2020-03-11 DIAGNOSIS — N186 End stage renal disease: Secondary | ICD-10-CM | POA: Diagnosis not present

## 2020-03-11 DIAGNOSIS — D509 Iron deficiency anemia, unspecified: Secondary | ICD-10-CM | POA: Diagnosis not present

## 2020-03-11 DIAGNOSIS — N2581 Secondary hyperparathyroidism of renal origin: Secondary | ICD-10-CM | POA: Diagnosis not present

## 2020-03-11 DIAGNOSIS — T8579XA Infection and inflammatory reaction due to other internal prosthetic devices, implants and grafts, initial encounter: Secondary | ICD-10-CM | POA: Diagnosis not present

## 2020-03-11 DIAGNOSIS — E876 Hypokalemia: Secondary | ICD-10-CM | POA: Diagnosis not present

## 2020-03-11 DIAGNOSIS — Z992 Dependence on renal dialysis: Secondary | ICD-10-CM | POA: Diagnosis not present

## 2020-03-12 DIAGNOSIS — M542 Cervicalgia: Secondary | ICD-10-CM | POA: Diagnosis not present

## 2020-03-12 DIAGNOSIS — R269 Unspecified abnormalities of gait and mobility: Secondary | ICD-10-CM | POA: Diagnosis not present

## 2020-03-12 DIAGNOSIS — R2689 Other abnormalities of gait and mobility: Secondary | ICD-10-CM | POA: Diagnosis not present

## 2020-03-12 DIAGNOSIS — M6281 Muscle weakness (generalized): Secondary | ICD-10-CM | POA: Diagnosis not present

## 2020-03-13 DIAGNOSIS — D509 Iron deficiency anemia, unspecified: Secondary | ICD-10-CM | POA: Diagnosis not present

## 2020-03-13 DIAGNOSIS — N2581 Secondary hyperparathyroidism of renal origin: Secondary | ICD-10-CM | POA: Diagnosis not present

## 2020-03-13 DIAGNOSIS — E876 Hypokalemia: Secondary | ICD-10-CM | POA: Diagnosis not present

## 2020-03-13 DIAGNOSIS — N186 End stage renal disease: Secondary | ICD-10-CM | POA: Diagnosis not present

## 2020-03-13 DIAGNOSIS — T8579XA Infection and inflammatory reaction due to other internal prosthetic devices, implants and grafts, initial encounter: Secondary | ICD-10-CM | POA: Diagnosis not present

## 2020-03-13 DIAGNOSIS — Z992 Dependence on renal dialysis: Secondary | ICD-10-CM | POA: Diagnosis not present

## 2020-03-16 DIAGNOSIS — N186 End stage renal disease: Secondary | ICD-10-CM | POA: Diagnosis not present

## 2020-03-16 DIAGNOSIS — D509 Iron deficiency anemia, unspecified: Secondary | ICD-10-CM | POA: Diagnosis not present

## 2020-03-16 DIAGNOSIS — T8579XA Infection and inflammatory reaction due to other internal prosthetic devices, implants and grafts, initial encounter: Secondary | ICD-10-CM | POA: Diagnosis not present

## 2020-03-16 DIAGNOSIS — Z992 Dependence on renal dialysis: Secondary | ICD-10-CM | POA: Diagnosis not present

## 2020-03-16 DIAGNOSIS — E876 Hypokalemia: Secondary | ICD-10-CM | POA: Diagnosis not present

## 2020-03-16 DIAGNOSIS — N2581 Secondary hyperparathyroidism of renal origin: Secondary | ICD-10-CM | POA: Diagnosis not present

## 2020-03-17 DIAGNOSIS — M6281 Muscle weakness (generalized): Secondary | ICD-10-CM | POA: Diagnosis not present

## 2020-03-17 DIAGNOSIS — R269 Unspecified abnormalities of gait and mobility: Secondary | ICD-10-CM | POA: Diagnosis not present

## 2020-03-17 DIAGNOSIS — M542 Cervicalgia: Secondary | ICD-10-CM | POA: Diagnosis not present

## 2020-03-17 DIAGNOSIS — R2689 Other abnormalities of gait and mobility: Secondary | ICD-10-CM | POA: Diagnosis not present

## 2020-03-18 DIAGNOSIS — E1129 Type 2 diabetes mellitus with other diabetic kidney complication: Secondary | ICD-10-CM | POA: Diagnosis not present

## 2020-03-18 DIAGNOSIS — D509 Iron deficiency anemia, unspecified: Secondary | ICD-10-CM | POA: Diagnosis not present

## 2020-03-18 DIAGNOSIS — N2581 Secondary hyperparathyroidism of renal origin: Secondary | ICD-10-CM | POA: Diagnosis not present

## 2020-03-18 DIAGNOSIS — Z992 Dependence on renal dialysis: Secondary | ICD-10-CM | POA: Diagnosis not present

## 2020-03-18 DIAGNOSIS — T8579XA Infection and inflammatory reaction due to other internal prosthetic devices, implants and grafts, initial encounter: Secondary | ICD-10-CM | POA: Diagnosis not present

## 2020-03-18 DIAGNOSIS — N186 End stage renal disease: Secondary | ICD-10-CM | POA: Diagnosis not present

## 2020-03-18 DIAGNOSIS — E876 Hypokalemia: Secondary | ICD-10-CM | POA: Diagnosis not present

## 2020-03-19 DIAGNOSIS — Z298 Encounter for other specified prophylactic measures: Secondary | ICD-10-CM | POA: Diagnosis not present

## 2020-03-19 DIAGNOSIS — M6281 Muscle weakness (generalized): Secondary | ICD-10-CM | POA: Diagnosis not present

## 2020-03-19 DIAGNOSIS — Z942 Lung transplant status: Secondary | ICD-10-CM | POA: Diagnosis not present

## 2020-03-19 DIAGNOSIS — Z79899 Other long term (current) drug therapy: Secondary | ICD-10-CM | POA: Diagnosis not present

## 2020-03-19 DIAGNOSIS — M542 Cervicalgia: Secondary | ICD-10-CM | POA: Diagnosis not present

## 2020-03-19 DIAGNOSIS — T86819 Unspecified complication of lung transplant: Secondary | ICD-10-CM | POA: Diagnosis not present

## 2020-03-19 DIAGNOSIS — Z5181 Encounter for therapeutic drug level monitoring: Secondary | ICD-10-CM | POA: Diagnosis not present

## 2020-03-19 DIAGNOSIS — R269 Unspecified abnormalities of gait and mobility: Secondary | ICD-10-CM | POA: Diagnosis not present

## 2020-03-19 DIAGNOSIS — R2689 Other abnormalities of gait and mobility: Secondary | ICD-10-CM | POA: Diagnosis not present

## 2020-03-20 DIAGNOSIS — N186 End stage renal disease: Secondary | ICD-10-CM | POA: Diagnosis not present

## 2020-03-20 DIAGNOSIS — N2581 Secondary hyperparathyroidism of renal origin: Secondary | ICD-10-CM | POA: Diagnosis not present

## 2020-03-20 DIAGNOSIS — D509 Iron deficiency anemia, unspecified: Secondary | ICD-10-CM | POA: Diagnosis not present

## 2020-03-20 DIAGNOSIS — Z992 Dependence on renal dialysis: Secondary | ICD-10-CM | POA: Diagnosis not present

## 2020-03-20 DIAGNOSIS — T8579XA Infection and inflammatory reaction due to other internal prosthetic devices, implants and grafts, initial encounter: Secondary | ICD-10-CM | POA: Diagnosis not present

## 2020-03-20 DIAGNOSIS — E876 Hypokalemia: Secondary | ICD-10-CM | POA: Diagnosis not present

## 2020-03-23 DIAGNOSIS — D509 Iron deficiency anemia, unspecified: Secondary | ICD-10-CM | POA: Diagnosis not present

## 2020-03-23 DIAGNOSIS — N186 End stage renal disease: Secondary | ICD-10-CM | POA: Diagnosis not present

## 2020-03-23 DIAGNOSIS — Z992 Dependence on renal dialysis: Secondary | ICD-10-CM | POA: Diagnosis not present

## 2020-03-23 DIAGNOSIS — E876 Hypokalemia: Secondary | ICD-10-CM | POA: Diagnosis not present

## 2020-03-23 DIAGNOSIS — N2581 Secondary hyperparathyroidism of renal origin: Secondary | ICD-10-CM | POA: Diagnosis not present

## 2020-03-23 DIAGNOSIS — T8579XA Infection and inflammatory reaction due to other internal prosthetic devices, implants and grafts, initial encounter: Secondary | ICD-10-CM | POA: Diagnosis not present

## 2020-03-24 DIAGNOSIS — E119 Type 2 diabetes mellitus without complications: Secondary | ICD-10-CM | POA: Diagnosis not present

## 2020-03-24 DIAGNOSIS — M6281 Muscle weakness (generalized): Secondary | ICD-10-CM | POA: Diagnosis not present

## 2020-03-24 DIAGNOSIS — Z794 Long term (current) use of insulin: Secondary | ICD-10-CM | POA: Diagnosis not present

## 2020-03-24 DIAGNOSIS — R269 Unspecified abnormalities of gait and mobility: Secondary | ICD-10-CM | POA: Diagnosis not present

## 2020-03-24 DIAGNOSIS — R2689 Other abnormalities of gait and mobility: Secondary | ICD-10-CM | POA: Diagnosis not present

## 2020-03-24 DIAGNOSIS — M542 Cervicalgia: Secondary | ICD-10-CM | POA: Diagnosis not present

## 2020-03-25 DIAGNOSIS — T8579XA Infection and inflammatory reaction due to other internal prosthetic devices, implants and grafts, initial encounter: Secondary | ICD-10-CM | POA: Diagnosis not present

## 2020-03-25 DIAGNOSIS — E876 Hypokalemia: Secondary | ICD-10-CM | POA: Diagnosis not present

## 2020-03-25 DIAGNOSIS — Z992 Dependence on renal dialysis: Secondary | ICD-10-CM | POA: Diagnosis not present

## 2020-03-25 DIAGNOSIS — N2581 Secondary hyperparathyroidism of renal origin: Secondary | ICD-10-CM | POA: Diagnosis not present

## 2020-03-25 DIAGNOSIS — N186 End stage renal disease: Secondary | ICD-10-CM | POA: Diagnosis not present

## 2020-03-25 DIAGNOSIS — D509 Iron deficiency anemia, unspecified: Secondary | ICD-10-CM | POA: Diagnosis not present

## 2020-03-26 DIAGNOSIS — M6281 Muscle weakness (generalized): Secondary | ICD-10-CM | POA: Diagnosis not present

## 2020-03-26 DIAGNOSIS — Z79899 Other long term (current) drug therapy: Secondary | ICD-10-CM | POA: Diagnosis not present

## 2020-03-26 DIAGNOSIS — R269 Unspecified abnormalities of gait and mobility: Secondary | ICD-10-CM | POA: Diagnosis not present

## 2020-03-26 DIAGNOSIS — M542 Cervicalgia: Secondary | ICD-10-CM | POA: Diagnosis not present

## 2020-03-26 DIAGNOSIS — Z298 Encounter for other specified prophylactic measures: Secondary | ICD-10-CM | POA: Diagnosis not present

## 2020-03-26 DIAGNOSIS — T86819 Unspecified complication of lung transplant: Secondary | ICD-10-CM | POA: Diagnosis not present

## 2020-03-26 DIAGNOSIS — Z5181 Encounter for therapeutic drug level monitoring: Secondary | ICD-10-CM | POA: Diagnosis not present

## 2020-03-26 DIAGNOSIS — Z942 Lung transplant status: Secondary | ICD-10-CM | POA: Diagnosis not present

## 2020-03-26 DIAGNOSIS — R2689 Other abnormalities of gait and mobility: Secondary | ICD-10-CM | POA: Diagnosis not present

## 2020-03-27 DIAGNOSIS — Z992 Dependence on renal dialysis: Secondary | ICD-10-CM | POA: Diagnosis not present

## 2020-03-27 DIAGNOSIS — T8579XA Infection and inflammatory reaction due to other internal prosthetic devices, implants and grafts, initial encounter: Secondary | ICD-10-CM | POA: Diagnosis not present

## 2020-03-27 DIAGNOSIS — D509 Iron deficiency anemia, unspecified: Secondary | ICD-10-CM | POA: Diagnosis not present

## 2020-03-27 DIAGNOSIS — N186 End stage renal disease: Secondary | ICD-10-CM | POA: Diagnosis not present

## 2020-03-27 DIAGNOSIS — N2581 Secondary hyperparathyroidism of renal origin: Secondary | ICD-10-CM | POA: Diagnosis not present

## 2020-03-27 DIAGNOSIS — E876 Hypokalemia: Secondary | ICD-10-CM | POA: Diagnosis not present

## 2020-03-30 DIAGNOSIS — E876 Hypokalemia: Secondary | ICD-10-CM | POA: Diagnosis not present

## 2020-03-30 DIAGNOSIS — N186 End stage renal disease: Secondary | ICD-10-CM | POA: Diagnosis not present

## 2020-03-30 DIAGNOSIS — D509 Iron deficiency anemia, unspecified: Secondary | ICD-10-CM | POA: Diagnosis not present

## 2020-03-30 DIAGNOSIS — Z992 Dependence on renal dialysis: Secondary | ICD-10-CM | POA: Diagnosis not present

## 2020-03-30 DIAGNOSIS — T8579XA Infection and inflammatory reaction due to other internal prosthetic devices, implants and grafts, initial encounter: Secondary | ICD-10-CM | POA: Diagnosis not present

## 2020-03-30 DIAGNOSIS — N2581 Secondary hyperparathyroidism of renal origin: Secondary | ICD-10-CM | POA: Diagnosis not present

## 2020-04-01 DIAGNOSIS — Z992 Dependence on renal dialysis: Secondary | ICD-10-CM | POA: Diagnosis not present

## 2020-04-01 DIAGNOSIS — E876 Hypokalemia: Secondary | ICD-10-CM | POA: Diagnosis not present

## 2020-04-01 DIAGNOSIS — N186 End stage renal disease: Secondary | ICD-10-CM | POA: Diagnosis not present

## 2020-04-01 DIAGNOSIS — N2581 Secondary hyperparathyroidism of renal origin: Secondary | ICD-10-CM | POA: Diagnosis not present

## 2020-04-01 DIAGNOSIS — D509 Iron deficiency anemia, unspecified: Secondary | ICD-10-CM | POA: Diagnosis not present

## 2020-04-01 DIAGNOSIS — T8579XA Infection and inflammatory reaction due to other internal prosthetic devices, implants and grafts, initial encounter: Secondary | ICD-10-CM | POA: Diagnosis not present

## 2020-04-02 DIAGNOSIS — R2689 Other abnormalities of gait and mobility: Secondary | ICD-10-CM | POA: Diagnosis not present

## 2020-04-02 DIAGNOSIS — M6281 Muscle weakness (generalized): Secondary | ICD-10-CM | POA: Diagnosis not present

## 2020-04-02 DIAGNOSIS — R269 Unspecified abnormalities of gait and mobility: Secondary | ICD-10-CM | POA: Diagnosis not present

## 2020-04-02 DIAGNOSIS — M542 Cervicalgia: Secondary | ICD-10-CM | POA: Diagnosis not present

## 2020-04-02 DIAGNOSIS — Z23 Encounter for immunization: Secondary | ICD-10-CM | POA: Diagnosis not present

## 2020-04-03 DIAGNOSIS — D509 Iron deficiency anemia, unspecified: Secondary | ICD-10-CM | POA: Diagnosis not present

## 2020-04-03 DIAGNOSIS — T8579XA Infection and inflammatory reaction due to other internal prosthetic devices, implants and grafts, initial encounter: Secondary | ICD-10-CM | POA: Diagnosis not present

## 2020-04-03 DIAGNOSIS — E876 Hypokalemia: Secondary | ICD-10-CM | POA: Diagnosis not present

## 2020-04-03 DIAGNOSIS — Z992 Dependence on renal dialysis: Secondary | ICD-10-CM | POA: Diagnosis not present

## 2020-04-03 DIAGNOSIS — N2581 Secondary hyperparathyroidism of renal origin: Secondary | ICD-10-CM | POA: Diagnosis not present

## 2020-04-03 DIAGNOSIS — N186 End stage renal disease: Secondary | ICD-10-CM | POA: Diagnosis not present

## 2020-04-06 DIAGNOSIS — E876 Hypokalemia: Secondary | ICD-10-CM | POA: Diagnosis not present

## 2020-04-06 DIAGNOSIS — Z992 Dependence on renal dialysis: Secondary | ICD-10-CM | POA: Diagnosis not present

## 2020-04-06 DIAGNOSIS — T86819 Unspecified complication of lung transplant: Secondary | ICD-10-CM | POA: Diagnosis not present

## 2020-04-06 DIAGNOSIS — N186 End stage renal disease: Secondary | ICD-10-CM | POA: Diagnosis not present

## 2020-04-06 DIAGNOSIS — T8579XA Infection and inflammatory reaction due to other internal prosthetic devices, implants and grafts, initial encounter: Secondary | ICD-10-CM | POA: Diagnosis not present

## 2020-04-06 DIAGNOSIS — N2581 Secondary hyperparathyroidism of renal origin: Secondary | ICD-10-CM | POA: Diagnosis not present

## 2020-04-07 DIAGNOSIS — H04223 Epiphora due to insufficient drainage, bilateral lacrimal glands: Secondary | ICD-10-CM | POA: Diagnosis not present

## 2020-04-07 DIAGNOSIS — H04123 Dry eye syndrome of bilateral lacrimal glands: Secondary | ICD-10-CM | POA: Diagnosis not present

## 2020-04-07 DIAGNOSIS — H11823 Conjunctivochalasis, bilateral: Secondary | ICD-10-CM | POA: Diagnosis not present

## 2020-04-08 DIAGNOSIS — T8579XA Infection and inflammatory reaction due to other internal prosthetic devices, implants and grafts, initial encounter: Secondary | ICD-10-CM | POA: Diagnosis not present

## 2020-04-08 DIAGNOSIS — N2581 Secondary hyperparathyroidism of renal origin: Secondary | ICD-10-CM | POA: Diagnosis not present

## 2020-04-08 DIAGNOSIS — Z992 Dependence on renal dialysis: Secondary | ICD-10-CM | POA: Diagnosis not present

## 2020-04-08 DIAGNOSIS — N186 End stage renal disease: Secondary | ICD-10-CM | POA: Diagnosis not present

## 2020-04-08 DIAGNOSIS — E876 Hypokalemia: Secondary | ICD-10-CM | POA: Diagnosis not present

## 2020-04-10 DIAGNOSIS — E876 Hypokalemia: Secondary | ICD-10-CM | POA: Diagnosis not present

## 2020-04-10 DIAGNOSIS — Z992 Dependence on renal dialysis: Secondary | ICD-10-CM | POA: Diagnosis not present

## 2020-04-10 DIAGNOSIS — T8579XA Infection and inflammatory reaction due to other internal prosthetic devices, implants and grafts, initial encounter: Secondary | ICD-10-CM | POA: Diagnosis not present

## 2020-04-10 DIAGNOSIS — N186 End stage renal disease: Secondary | ICD-10-CM | POA: Diagnosis not present

## 2020-04-10 DIAGNOSIS — N2581 Secondary hyperparathyroidism of renal origin: Secondary | ICD-10-CM | POA: Diagnosis not present

## 2020-04-13 DIAGNOSIS — E876 Hypokalemia: Secondary | ICD-10-CM | POA: Diagnosis not present

## 2020-04-13 DIAGNOSIS — T8579XA Infection and inflammatory reaction due to other internal prosthetic devices, implants and grafts, initial encounter: Secondary | ICD-10-CM | POA: Diagnosis not present

## 2020-04-13 DIAGNOSIS — N186 End stage renal disease: Secondary | ICD-10-CM | POA: Diagnosis not present

## 2020-04-13 DIAGNOSIS — N2581 Secondary hyperparathyroidism of renal origin: Secondary | ICD-10-CM | POA: Diagnosis not present

## 2020-04-13 DIAGNOSIS — Z992 Dependence on renal dialysis: Secondary | ICD-10-CM | POA: Diagnosis not present

## 2020-04-15 DIAGNOSIS — N186 End stage renal disease: Secondary | ICD-10-CM | POA: Diagnosis not present

## 2020-04-15 DIAGNOSIS — E876 Hypokalemia: Secondary | ICD-10-CM | POA: Diagnosis not present

## 2020-04-15 DIAGNOSIS — Z992 Dependence on renal dialysis: Secondary | ICD-10-CM | POA: Diagnosis not present

## 2020-04-15 DIAGNOSIS — T8579XA Infection and inflammatory reaction due to other internal prosthetic devices, implants and grafts, initial encounter: Secondary | ICD-10-CM | POA: Diagnosis not present

## 2020-04-15 DIAGNOSIS — N2581 Secondary hyperparathyroidism of renal origin: Secondary | ICD-10-CM | POA: Diagnosis not present

## 2020-04-17 DIAGNOSIS — Z992 Dependence on renal dialysis: Secondary | ICD-10-CM | POA: Diagnosis not present

## 2020-04-17 DIAGNOSIS — E876 Hypokalemia: Secondary | ICD-10-CM | POA: Diagnosis not present

## 2020-04-17 DIAGNOSIS — T8579XA Infection and inflammatory reaction due to other internal prosthetic devices, implants and grafts, initial encounter: Secondary | ICD-10-CM | POA: Diagnosis not present

## 2020-04-17 DIAGNOSIS — N2581 Secondary hyperparathyroidism of renal origin: Secondary | ICD-10-CM | POA: Diagnosis not present

## 2020-04-17 DIAGNOSIS — N186 End stage renal disease: Secondary | ICD-10-CM | POA: Diagnosis not present

## 2020-04-20 DIAGNOSIS — E876 Hypokalemia: Secondary | ICD-10-CM | POA: Diagnosis not present

## 2020-04-20 DIAGNOSIS — N186 End stage renal disease: Secondary | ICD-10-CM | POA: Diagnosis not present

## 2020-04-20 DIAGNOSIS — Z992 Dependence on renal dialysis: Secondary | ICD-10-CM | POA: Diagnosis not present

## 2020-04-20 DIAGNOSIS — N2581 Secondary hyperparathyroidism of renal origin: Secondary | ICD-10-CM | POA: Diagnosis not present

## 2020-04-20 DIAGNOSIS — T8579XA Infection and inflammatory reaction due to other internal prosthetic devices, implants and grafts, initial encounter: Secondary | ICD-10-CM | POA: Diagnosis not present

## 2020-04-21 DIAGNOSIS — Z992 Dependence on renal dialysis: Secondary | ICD-10-CM | POA: Diagnosis not present

## 2020-04-21 DIAGNOSIS — T82858A Stenosis of vascular prosthetic devices, implants and grafts, initial encounter: Secondary | ICD-10-CM | POA: Diagnosis not present

## 2020-04-21 DIAGNOSIS — I871 Compression of vein: Secondary | ICD-10-CM | POA: Diagnosis not present

## 2020-04-21 DIAGNOSIS — N186 End stage renal disease: Secondary | ICD-10-CM | POA: Diagnosis not present

## 2020-04-22 DIAGNOSIS — N2581 Secondary hyperparathyroidism of renal origin: Secondary | ICD-10-CM | POA: Diagnosis not present

## 2020-04-22 DIAGNOSIS — T8579XA Infection and inflammatory reaction due to other internal prosthetic devices, implants and grafts, initial encounter: Secondary | ICD-10-CM | POA: Diagnosis not present

## 2020-04-22 DIAGNOSIS — E876 Hypokalemia: Secondary | ICD-10-CM | POA: Diagnosis not present

## 2020-04-22 DIAGNOSIS — Z992 Dependence on renal dialysis: Secondary | ICD-10-CM | POA: Diagnosis not present

## 2020-04-22 DIAGNOSIS — N186 End stage renal disease: Secondary | ICD-10-CM | POA: Diagnosis not present

## 2020-04-23 DIAGNOSIS — Z298 Encounter for other specified prophylactic measures: Secondary | ICD-10-CM | POA: Diagnosis not present

## 2020-04-23 DIAGNOSIS — Z5181 Encounter for therapeutic drug level monitoring: Secondary | ICD-10-CM | POA: Diagnosis not present

## 2020-04-23 DIAGNOSIS — Z79899 Other long term (current) drug therapy: Secondary | ICD-10-CM | POA: Diagnosis not present

## 2020-04-23 DIAGNOSIS — Z942 Lung transplant status: Secondary | ICD-10-CM | POA: Diagnosis not present

## 2020-04-23 DIAGNOSIS — T86819 Unspecified complication of lung transplant: Secondary | ICD-10-CM | POA: Diagnosis not present

## 2020-04-24 ENCOUNTER — Emergency Department (HOSPITAL_COMMUNITY): Payer: Medicare Other

## 2020-04-24 ENCOUNTER — Inpatient Hospital Stay (HOSPITAL_COMMUNITY)
Admission: EM | Admit: 2020-04-24 | Discharge: 2020-04-27 | DRG: 371 | Disposition: A | Discharge status: Home / Self Care | Payer: Medicare Other | Attending: Internal Medicine | Admitting: Internal Medicine

## 2020-04-24 ENCOUNTER — Encounter (HOSPITAL_COMMUNITY): Payer: Self-pay | Admitting: Emergency Medicine

## 2020-04-24 ENCOUNTER — Inpatient Hospital Stay (HOSPITAL_COMMUNITY): Payer: Medicare Other

## 2020-04-24 ENCOUNTER — Other Ambulatory Visit: Payer: Self-pay

## 2020-04-24 DIAGNOSIS — G4700 Insomnia, unspecified: Secondary | ICD-10-CM | POA: Diagnosis not present

## 2020-04-24 DIAGNOSIS — Z87891 Personal history of nicotine dependence: Secondary | ICD-10-CM

## 2020-04-24 DIAGNOSIS — R404 Transient alteration of awareness: Secondary | ICD-10-CM | POA: Diagnosis not present

## 2020-04-24 DIAGNOSIS — K219 Gastro-esophageal reflux disease without esophagitis: Secondary | ICD-10-CM | POA: Diagnosis present

## 2020-04-24 DIAGNOSIS — D849 Immunodeficiency, unspecified: Secondary | ICD-10-CM | POA: Diagnosis not present

## 2020-04-24 DIAGNOSIS — Z7952 Long term (current) use of systemic steroids: Secondary | ICD-10-CM | POA: Diagnosis not present

## 2020-04-24 DIAGNOSIS — R531 Weakness: Secondary | ICD-10-CM | POA: Diagnosis not present

## 2020-04-24 DIAGNOSIS — Z942 Lung transplant status: Secondary | ICD-10-CM | POA: Diagnosis not present

## 2020-04-24 DIAGNOSIS — R6520 Severe sepsis without septic shock: Secondary | ICD-10-CM

## 2020-04-24 DIAGNOSIS — D61818 Other pancytopenia: Secondary | ICD-10-CM | POA: Diagnosis present

## 2020-04-24 DIAGNOSIS — G9340 Encephalopathy, unspecified: Secondary | ICD-10-CM | POA: Diagnosis not present

## 2020-04-24 DIAGNOSIS — Z20822 Contact with and (suspected) exposure to covid-19: Secondary | ICD-10-CM | POA: Diagnosis present

## 2020-04-24 DIAGNOSIS — Z7982 Long term (current) use of aspirin: Secondary | ICD-10-CM

## 2020-04-24 DIAGNOSIS — H409 Unspecified glaucoma: Secondary | ICD-10-CM | POA: Diagnosis not present

## 2020-04-24 DIAGNOSIS — E1122 Type 2 diabetes mellitus with diabetic chronic kidney disease: Secondary | ICD-10-CM | POA: Diagnosis present

## 2020-04-24 DIAGNOSIS — J211 Acute bronchiolitis due to human metapneumovirus: Secondary | ICD-10-CM | POA: Diagnosis not present

## 2020-04-24 DIAGNOSIS — Z794 Long term (current) use of insulin: Secondary | ICD-10-CM | POA: Diagnosis not present

## 2020-04-24 DIAGNOSIS — Z79899 Other long term (current) drug therapy: Secondary | ICD-10-CM | POA: Diagnosis not present

## 2020-04-24 DIAGNOSIS — R159 Full incontinence of feces: Secondary | ICD-10-CM | POA: Diagnosis not present

## 2020-04-24 DIAGNOSIS — K7460 Unspecified cirrhosis of liver: Secondary | ICD-10-CM | POA: Diagnosis present

## 2020-04-24 DIAGNOSIS — E1165 Type 2 diabetes mellitus with hyperglycemia: Secondary | ICD-10-CM

## 2020-04-24 DIAGNOSIS — E8889 Other specified metabolic disorders: Secondary | ICD-10-CM | POA: Diagnosis present

## 2020-04-24 DIAGNOSIS — K8020 Calculus of gallbladder without cholecystitis without obstruction: Secondary | ICD-10-CM | POA: Diagnosis not present

## 2020-04-24 DIAGNOSIS — N2581 Secondary hyperparathyroidism of renal origin: Secondary | ICD-10-CM | POA: Diagnosis present

## 2020-04-24 DIAGNOSIS — N186 End stage renal disease: Secondary | ICD-10-CM | POA: Diagnosis present

## 2020-04-24 DIAGNOSIS — I5032 Chronic diastolic (congestive) heart failure: Secondary | ICD-10-CM | POA: Diagnosis not present

## 2020-04-24 DIAGNOSIS — E876 Hypokalemia: Secondary | ICD-10-CM | POA: Diagnosis not present

## 2020-04-24 DIAGNOSIS — J849 Interstitial pulmonary disease, unspecified: Secondary | ICD-10-CM | POA: Diagnosis not present

## 2020-04-24 DIAGNOSIS — R059 Cough, unspecified: Secondary | ICD-10-CM | POA: Diagnosis not present

## 2020-04-24 DIAGNOSIS — E1169 Type 2 diabetes mellitus with other specified complication: Secondary | ICD-10-CM | POA: Diagnosis not present

## 2020-04-24 DIAGNOSIS — E782 Mixed hyperlipidemia: Secondary | ICD-10-CM | POA: Diagnosis present

## 2020-04-24 DIAGNOSIS — G4733 Obstructive sleep apnea (adult) (pediatric): Secondary | ICD-10-CM | POA: Diagnosis present

## 2020-04-24 DIAGNOSIS — J8410 Pulmonary fibrosis, unspecified: Secondary | ICD-10-CM | POA: Diagnosis present

## 2020-04-24 DIAGNOSIS — A0472 Enterocolitis due to Clostridium difficile, not specified as recurrent: Secondary | ICD-10-CM | POA: Diagnosis present

## 2020-04-24 DIAGNOSIS — I959 Hypotension, unspecified: Secondary | ICD-10-CM | POA: Diagnosis not present

## 2020-04-24 DIAGNOSIS — J9611 Chronic respiratory failure with hypoxia: Secondary | ICD-10-CM | POA: Diagnosis not present

## 2020-04-24 DIAGNOSIS — T8579XA Infection and inflammatory reaction due to other internal prosthetic devices, implants and grafts, initial encounter: Secondary | ICD-10-CM | POA: Diagnosis not present

## 2020-04-24 DIAGNOSIS — I120 Hypertensive chronic kidney disease with stage 5 chronic kidney disease or end stage renal disease: Secondary | ICD-10-CM | POA: Diagnosis present

## 2020-04-24 DIAGNOSIS — R197 Diarrhea, unspecified: Secondary | ICD-10-CM | POA: Diagnosis not present

## 2020-04-24 DIAGNOSIS — A419 Sepsis, unspecified organism: Secondary | ICD-10-CM | POA: Diagnosis not present

## 2020-04-24 DIAGNOSIS — I9589 Other hypotension: Secondary | ICD-10-CM | POA: Diagnosis present

## 2020-04-24 DIAGNOSIS — I10 Essential (primary) hypertension: Secondary | ICD-10-CM | POA: Diagnosis not present

## 2020-04-24 DIAGNOSIS — J9610 Chronic respiratory failure, unspecified whether with hypoxia or hypercapnia: Secondary | ICD-10-CM | POA: Diagnosis present

## 2020-04-24 DIAGNOSIS — R0902 Hypoxemia: Secondary | ICD-10-CM | POA: Diagnosis not present

## 2020-04-24 DIAGNOSIS — J3489 Other specified disorders of nose and nasal sinuses: Secondary | ICD-10-CM | POA: Diagnosis not present

## 2020-04-24 DIAGNOSIS — I6782 Cerebral ischemia: Secondary | ICD-10-CM | POA: Diagnosis not present

## 2020-04-24 DIAGNOSIS — F329 Major depressive disorder, single episode, unspecified: Secondary | ICD-10-CM | POA: Diagnosis not present

## 2020-04-24 DIAGNOSIS — Z992 Dependence on renal dialysis: Secondary | ICD-10-CM

## 2020-04-24 DIAGNOSIS — R509 Fever, unspecified: Secondary | ICD-10-CM | POA: Diagnosis not present

## 2020-04-24 DIAGNOSIS — R0602 Shortness of breath: Secondary | ICD-10-CM | POA: Diagnosis not present

## 2020-04-24 DIAGNOSIS — E1129 Type 2 diabetes mellitus with other diabetic kidney complication: Secondary | ICD-10-CM | POA: Diagnosis not present

## 2020-04-24 DIAGNOSIS — N250 Renal osteodystrophy: Secondary | ICD-10-CM | POA: Diagnosis not present

## 2020-04-24 DIAGNOSIS — I270 Primary pulmonary hypertension: Secondary | ICD-10-CM | POA: Diagnosis not present

## 2020-04-24 LAB — CBC WITH DIFFERENTIAL/PLATELET
Abs Immature Granulocytes: 0.17 10*3/uL — ABNORMAL HIGH (ref 0.00–0.07)
Basophils Absolute: 0 10*3/uL (ref 0.0–0.1)
Basophils Relative: 1 %
Eosinophils Absolute: 0.1 10*3/uL (ref 0.0–0.5)
Eosinophils Relative: 4 %
HCT: 42.5 % (ref 39.0–52.0)
Hemoglobin: 13.2 g/dL (ref 13.0–17.0)
Immature Granulocytes: 6 %
Lymphocytes Relative: 19 %
Lymphs Abs: 0.5 10*3/uL — ABNORMAL LOW (ref 0.7–4.0)
MCH: 34.4 pg — ABNORMAL HIGH (ref 26.0–34.0)
MCHC: 31.1 g/dL (ref 30.0–36.0)
MCV: 110.7 fL — ABNORMAL HIGH (ref 80.0–100.0)
Monocytes Absolute: 0.6 10*3/uL (ref 0.1–1.0)
Monocytes Relative: 22 %
Neutro Abs: 1.3 10*3/uL — ABNORMAL LOW (ref 1.7–7.7)
Neutrophils Relative %: 48 %
Platelets: 128 10*3/uL — ABNORMAL LOW (ref 150–400)
RBC: 3.84 MIL/uL — ABNORMAL LOW (ref 4.22–5.81)
RDW: 15 % (ref 11.5–15.5)
WBC: 2.7 10*3/uL — ABNORMAL LOW (ref 4.0–10.5)
nRBC: 0 % (ref 0.0–0.2)

## 2020-04-24 LAB — COMPREHENSIVE METABOLIC PANEL
ALT: 36 U/L (ref 0–44)
AST: 72 U/L — ABNORMAL HIGH (ref 15–41)
Albumin: 3 g/dL — ABNORMAL LOW (ref 3.5–5.0)
Alkaline Phosphatase: 452 U/L — ABNORMAL HIGH (ref 38–126)
Anion gap: 19 — ABNORMAL HIGH (ref 5–15)
BUN: 13 mg/dL (ref 8–23)
CO2: 27 mmol/L (ref 22–32)
Calcium: 8.1 mg/dL — ABNORMAL LOW (ref 8.9–10.3)
Chloride: 93 mmol/L — ABNORMAL LOW (ref 98–111)
Creatinine, Ser: 4.7 mg/dL — ABNORMAL HIGH (ref 0.61–1.24)
GFR, Estimated: 13 mL/min — ABNORMAL LOW (ref >60)
Glucose, Bld: 136 mg/dL — ABNORMAL HIGH (ref 70–99)
Potassium: 3.3 mmol/L — ABNORMAL LOW (ref 3.5–5.1)
Sodium: 139 mmol/L (ref 135–145)
Total Bilirubin: 1.8 mg/dL — ABNORMAL HIGH (ref 0.3–1.2)
Total Protein: 6.4 g/dL — ABNORMAL LOW (ref 6.5–8.1)

## 2020-04-24 LAB — CBG MONITORING, ED: Glucose-Capillary: 117 mg/dL — ABNORMAL HIGH (ref 70–99)

## 2020-04-24 LAB — GLUCOSE, CAPILLARY: Glucose-Capillary: 101 mg/dL — ABNORMAL HIGH (ref 70–99)

## 2020-04-24 LAB — C-REACTIVE PROTEIN: CRP: 9.6 mg/dL — ABNORMAL HIGH (ref <1.0)

## 2020-04-24 LAB — LACTIC ACID, PLASMA
Lactic Acid, Venous: 2.6 mmol/L (ref 0.5–1.9)
Lactic Acid, Venous: 1.1 mmol/L (ref 0.5–1.9)

## 2020-04-24 LAB — PROTIME-INR
INR: 1 (ref 0.8–1.2)
Prothrombin Time: 12.3 seconds (ref 11.4–15.2)

## 2020-04-24 LAB — SARS CORONAVIRUS 2 BY RT PCR (HOSPITAL ORDER, PERFORMED IN ~~LOC~~ HOSPITAL LAB): SARS Coronavirus 2: NEGATIVE

## 2020-04-24 LAB — APTT: aPTT: 32 seconds (ref 24–36)

## 2020-04-24 MED ORDER — SODIUM CHLORIDE 0.9 % IV BOLUS (SEPSIS): 1000.0000 mL | Freq: Once | INTRAVENOUS | Status: DC

## 2020-04-24 MED ORDER — SODIUM CHLORIDE 0.9 % IV SOLN
1.0000 g | INTRAVENOUS | Status: DC
  Filled 2020-04-24: qty 1

## 2020-04-24 MED ORDER — FLUOROMETHOLONE 0.1 % OP SUSP
1.0000 [drp] | Freq: Four times a day (QID) | OPHTHALMIC | Status: DC
  Administered 2020-04-25 – 2020-04-27 (×8): 1 [drp] via OPHTHALMIC
  Filled 2020-04-24: qty 5

## 2020-04-24 MED ORDER — VALGANCICLOVIR HCL 450 MG PO TABS
450.0000 mg | ORAL_TABLET | ORAL | Status: DC
  Filled 2020-04-24: qty 1

## 2020-04-24 MED ORDER — ALPHA LIPOIC ACID 200 MG PO CAPS: 200.0000 mg | ORAL_CAPSULE | Freq: Two times a day (BID) | ORAL | Status: DC

## 2020-04-24 MED ORDER — DORZOLAMIDE HCL-TIMOLOL MAL 2-0.5 % OP SOLN
1.0000 [drp] | Freq: Two times a day (BID) | OPHTHALMIC | Status: DC
  Administered 2020-04-25 – 2020-04-27 (×5): 1 [drp] via OPHTHALMIC
  Filled 2020-04-24: qty 10

## 2020-04-24 MED ORDER — VANCOMYCIN HCL IN DEXTROSE 1-5 GM/200ML-% IV SOLN: 1000.0000 mg | Freq: Once | INTRAVENOUS | Status: DC

## 2020-04-24 MED ORDER — VANCOMYCIN HCL 1250 MG/250ML IV SOLN
1250.0000 mg | Freq: Once | INTRAVENOUS | Status: AC
  Administered 2020-04-24: 1250 mg via INTRAVENOUS
  Filled 2020-04-24: qty 250

## 2020-04-24 MED ORDER — HEPARIN SODIUM (PORCINE) 5000 UNIT/ML IJ SOLN
5000.0000 [IU] | Freq: Three times a day (TID) | INTRAMUSCULAR | Status: DC
  Administered 2020-04-25 – 2020-04-27 (×7): 5000 [IU] via SUBCUTANEOUS
  Filled 2020-04-24 (×8): qty 1

## 2020-04-24 MED ORDER — AZATHIOPRINE 50 MG PO TABS
50.0000 mg | ORAL_TABLET | ORAL | Status: DC
  Filled 2020-04-24: qty 1

## 2020-04-24 MED ORDER — SODIUM CHLORIDE 0.9 % IV BOLUS (SEPSIS): 250.0000 mL | Freq: Once | INTRAVENOUS | Status: DC

## 2020-04-24 MED ORDER — LACTATED RINGERS IV SOLN: INTRAVENOUS | Status: DC

## 2020-04-24 MED ORDER — MIDODRINE HCL 5 MG PO TABS: 10.0000 mg | ORAL_TABLET | ORAL | Status: DC

## 2020-04-24 MED ORDER — INSULIN ASPART 100 UNIT/ML ~~LOC~~ SOLN
0.0000 [IU] | Freq: Three times a day (TID) | SUBCUTANEOUS | Status: DC
  Administered 2020-04-25 (×2): 1 [IU] via SUBCUTANEOUS

## 2020-04-24 MED ORDER — ACETAMINOPHEN 325 MG PO TABS
650.0000 mg | ORAL_TABLET | Freq: Once | ORAL | Status: AC
  Administered 2020-04-24: 650 mg via ORAL
  Filled 2020-04-24: qty 2

## 2020-04-24 MED ORDER — PRAVASTATIN SODIUM 10 MG PO TABS
10.0000 mg | ORAL_TABLET | Freq: Every day | ORAL | Status: DC
  Administered 2020-04-25 – 2020-04-26 (×2): 10 mg via ORAL
  Filled 2020-04-24 (×3): qty 1

## 2020-04-24 MED ORDER — PREDNISOLONE ACETATE 1 % OP SUSP: 1.0000 [drp] | Freq: Two times a day (BID) | OPHTHALMIC | Status: DC

## 2020-04-24 MED ORDER — PREDNISONE 5 MG PO TABS
5.0000 mg | ORAL_TABLET | Freq: Every day | ORAL | Status: DC
  Administered 2020-04-25 – 2020-04-27 (×3): 5 mg via ORAL
  Filled 2020-04-24 (×4): qty 1

## 2020-04-24 MED ORDER — CALCIUM ACETATE (PHOS BINDER) 667 MG PO CAPS
1334.0000 mg | ORAL_CAPSULE | Freq: Three times a day (TID) | ORAL | Status: DC
  Administered 2020-04-25 – 2020-04-27 (×6): 1334 mg via ORAL
  Filled 2020-04-24 (×6): qty 2

## 2020-04-24 MED ORDER — METRONIDAZOLE 500 MG PO TABS
500.0000 mg | ORAL_TABLET | Freq: Three times a day (TID) | ORAL | Status: DC
  Filled 2020-04-24: qty 1

## 2020-04-24 MED ORDER — SODIUM CHLORIDE 0.9 % IV BOLUS: 500.0000 mL | Freq: Once | INTRAVENOUS | Status: DC

## 2020-04-24 MED ORDER — POLYVINYL ALCOHOL 1.4 % OP SOLN
1.0000 [drp] | Freq: Every day | OPHTHALMIC | Status: DC | PRN
  Administered 2020-04-27: 1 [drp] via OPHTHALMIC
  Filled 2020-04-24: qty 15

## 2020-04-24 MED ORDER — VANCOMYCIN HCL IN DEXTROSE 750-5 MG/150ML-% IV SOLN: 750.0000 mg | INTRAVENOUS | Status: DC

## 2020-04-24 MED ORDER — SODIUM CHLORIDE 0.9% FLUSH: 3.0000 mL | Freq: Two times a day (BID) | INTRAVENOUS | Status: DC

## 2020-04-24 MED ORDER — ACETAMINOPHEN 325 MG PO TABS: 650.0000 mg | ORAL_TABLET | Freq: Once | ORAL | Status: DC | PRN

## 2020-04-24 MED ORDER — PANTOPRAZOLE SODIUM 40 MG PO TBEC
40.0000 mg | DELAYED_RELEASE_TABLET | Freq: Every day | ORAL | Status: DC
  Administered 2020-04-25 – 2020-04-27 (×4): 40 mg via ORAL
  Filled 2020-04-24 (×4): qty 1

## 2020-04-24 MED ORDER — SULFAMETHOXAZOLE-TRIMETHOPRIM 400-80 MG PO TABS: 1.0000 | ORAL_TABLET | ORAL | Status: DC

## 2020-04-24 MED ORDER — LACTATED RINGERS IV BOLUS
1000.0000 mL | Freq: Once | INTRAVENOUS | Status: AC
  Administered 2020-04-24: 1000 mL via INTRAVENOUS

## 2020-04-24 MED ORDER — CYCLOSPORINE MODIFIED (NEORAL) 25 MG PO CAPS
70.0000 mg | ORAL_CAPSULE | Freq: Every day | ORAL | Status: DC
  Filled 2020-04-24: qty 3

## 2020-04-24 MED ORDER — METRONIDAZOLE 500 MG PO TABS
500.0000 mg | ORAL_TABLET | Freq: Three times a day (TID) | ORAL | Status: DC
  Administered 2020-04-25 (×3): 500 mg via ORAL
  Filled 2020-04-24 (×3): qty 1

## 2020-04-24 MED ORDER — MIDODRINE HCL 5 MG PO TABS: 5.0000 mg | ORAL_TABLET | Freq: Every day | ORAL | Status: DC | PRN

## 2020-04-24 MED ORDER — ASPIRIN EC 81 MG PO TBEC
81.0000 mg | DELAYED_RELEASE_TABLET | Freq: Every day | ORAL | Status: DC
  Administered 2020-04-25 – 2020-04-27 (×3): 81 mg via ORAL
  Filled 2020-04-24 (×3): qty 1

## 2020-04-24 MED ORDER — SODIUM CHLORIDE 0.9 % IV SOLN
2.0000 g | Freq: Once | INTRAVENOUS | Status: AC
  Administered 2020-04-24: 2 g via INTRAVENOUS
  Filled 2020-04-24: qty 2

## 2020-04-24 MED ORDER — CYCLOSPORINE MODIFIED (NEORAL) 25 MG PO CAPS
100.0000 mg | ORAL_CAPSULE | Freq: Every day | ORAL | Status: DC
  Administered 2020-04-25 – 2020-04-26 (×3): 100 mg via ORAL
  Filled 2020-04-24 (×5): qty 4

## 2020-04-24 NOTE — ED Notes (Signed)
Attempted to start IV but failed twice. IV team consult placed. Provider notified.

## 2020-04-24 NOTE — Progress Notes (Signed)
PHARMACIST - PHYSICIAN ORDER COMMUNICATION  CONCERNING: P&T Medication Policy on Herbal Medications  DESCRIPTION:  This patient's order for:  Alpha Lipoic Acid  has been noted.  This product(s) is classified as an "herbal" or natural product. Due to a lack of definitive safety studies or FDA approval, nonstandard manufacturing practices, plus the potential risk of unknown drug-drug interactions while on inpatient medications, the Pharmacy and Therapeutics Committee does not permit the use of "herbal" or natural products of this type within [facility].   ACTION TAKEN: The pharmacy department is unable to verify this order at this time and your patient has been informed of this safety policy. Please reevaluate patient's clinical condition at discharge and address if the herbal or natural product(s) should be resumed at that time.

## 2020-04-24 NOTE — ED Provider Notes (Signed)
MSE was initiated and I personally evaluated the patient and placed orders (if any) at  2:43 PM on April 24, 2020.  The patient appears stable so that the remainder of the MSE may be completed by another provider.  Patient seen in medical screening evaluation.  He has a history of end-stage renal disease and dialyzes Monday Wednesday Friday.  He has had a bad cough and sore throat for the past several days.  Apparently he became acutely acutely lethargic prior to arrival. She has chronic hypoxic respiratory on 3 L and is currently requiring 4 L via nasal cannula. He is mildly altered and confused.  Last dialyzed on Wednesday.  Patient's labs concerning for sepsis.  He states that he has been fully vaccinated for coronavirus.  I have placed sepsis and covid orders.   Margarita Mail, PA-C 04/24/20 1535    Valarie Merino, MD 04/29/20 [phone]

## 2020-04-24 NOTE — ED Notes (Signed)
This RN paged pharmacy to verify medications when available.

## 2020-04-24 NOTE — ED Provider Notes (Signed)
[facility] EMERGENCY DEPARTMENT Provider Note   CSN: [id number] Arrival date & time: 04/24/20  1400     History Chief Complaint  Patient presents with  . Fatigue  . Fever    [name] is a 69 y.o. male.  HPI Patient presents from home with concern of cough, sore throat, listlessness.  Exact timeframe is unclear, but the patient seems to have been ill for about 1 week.  He has been attending dialysis, most recently today.  Per report patient may have had an acute change in his interactivity within the past few hours.  He denies focal weakness, personally denies confusion, though he notes that he had is at a different hospital than he is located.  Beyond sore throat he denies focal pain, including chest pain, abdominal pain.  He notes ongoing cough, mild dyspnea, but denies nausea, vomiting, diarrhea. Possibly in the days prior to his onset of illness he had revision of his left upper arm fistula. Patient denies home oxygen use, but per chart review is on 3 L via nasal cannula. He has received 2 doses of his coronavirus vaccine.  With the assistance of nursing staff, history obtained by EMS, the patient, and chart review.   Past Medical History:  Diagnosis Date  . Aortic valve disorders   . Benign neoplasm of colon   . Degeneration of intervertebral disc, site unspecified   . Diabetes mellitus without complication ([place])   . 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 Esophageal reflux   . ESRD (end stage renal disease) on dialysis ([place])   . Essential hypertension   . Obstructive sleep apnea (adult) (pediatric)   . Osteoarthrosis, unspecified whether generalized or localized, unspecified site   . Other and unspecified hyperlipidemia   . Pneumonia    interstitial pneumonia  . Pulmonary fibrosis ([place])   . Renal disorder   . Respiratory failure with hypoxia ([place]) 12/2015  . Transplanted, lung ([place])   . Unspecified essential  hypertension     Patient Active Problem List   Diagnosis Date Noted  . Sepsis with acute organ dysfunction ([place]) 01/08/2020  . Lactic acidosis 01/08/2020  . Mixed diabetic hyperlipidemia associated with type 2 diabetes mellitus ([place]) 01/08/2020  . Hepatic cirrhosis ([place]) 01/08/2020  . Uncontrolled type 2 diabetes mellitus with hyperglycemia, with long-term current use of insulin ([place]) 01/08/2020  . IV infiltrate, initial encounter 01/08/2020  . Acute diarrhea 01/08/2020  . Acute metabolic encephalopathy 25/10/3974  . 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 AV fistula infection, initial encounter ([place]) 01/14/2019  . Immunocompromised state ([place]) 01/14/2019  . Wound infection 01/13/2019  . Sepsis ([place])   . CKD (chronic kidney disease)   . Generalized weakness 12/30/2015  . End-stage renal disease on hemodialysis ([place])   . 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 Acute respiratory failure with hypoxemia ([place]) 04/18/2015  . Septic shock ([place]) 04/18/2015  . Acute encephalopathy 04/18/2015  . Acute respiratory failure with hypoxia ([place]) 04/18/2015  . Cardiac arrest ([place])   . HCAP (healthcare-associated pneumonia)   . Elevated rheumatoid factor 05/09/2014  . Essential hypertension 05/09/2014  . ILD (interstitial lung disease) ([place]) 05/09/2014  . Obstructive apnea 05/09/2014  . Lung nodule, solitary 04/18/2014  . Awaiting organ transplant 04/18/2014  . Acute on chronic respiratory failure with hypoxia ([place]) 08/15/2013  . Edema 07/05/2013  . Chronic respiratory failure ([place]) 03/03/2013  . Diabetes mellitus with complication ([place]) 73/41/9379  . Acute sinusitis 05/04/2011  .  Cough 11/10/2010  . 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 Obstructive sleep apnea 10/09/2008  . GERD without esophagitis 10/09/2008  . HIATAL HERNIA 10/09/2008  . OSTEOARTHRITIS 10/09/2008    Past Surgical History:  Procedure Laterality Date  . COLONOSCOPY WITH PROPOFOL N/A 11/07/2017    Procedure: COLONOSCOPY WITH PROPOFOL;  Surgeon: Ronnette Juniper, MD;  Location: [facility];  Service: Gastroenterology;  Laterality: N/A;  . LIGATION OF ARTERIOVENOUS  FISTULA Left 01/14/2019   Procedure: LIGATION OF ARTERIOVENOUS  FISTULA;  Surgeon: Marty Heck, MD;  Location: [facility];  Service: Vascular;  Laterality: Left;  . LUNG BIOPSY  2010  . LUNG TRANSPLANT, SINGLE Right   . POLYPECTOMY  11/07/2017   Procedure: POLYPECTOMY;  Surgeon: Ronnette Juniper, MD;  Location: [facility];  Service: Gastroenterology;;       Family History  Problem Relation Age of Onset  . Pancreatic cancer Brother   . Heart disease Father     Social History   Tobacco Use  . Smoking status: Former Smoker    Packs/day: 0.30    Years: 20.00    Pack years: 6.00    Types: Cigarettes    Quit date: 06/06/2002    Years since quitting: 17.8  . Smokeless tobacco: Never Used  Vaping Use  . Vaping Use: Never used  Substance Use Topics  . Alcohol use: No    Alcohol/week: 0.0 standard drinks  . Drug use: No    Home Medications Prior to Admission medications   Medication Sig Start Date End Date Taking? Authorizing Provider  ACCU-CHEK FASTCLIX LANCETS MISC As directed up to 4 times daily 12/16/15   [provider]  acetaminophen (TYLENOL) 500 MG tablet Take 500-1,000 mg by mouth every 6 (six) hours as needed for moderate pain or headache.     [provider]  Alpha Lipoic Acid 200 MG CAPS Take 200 mg by mouth in the morning and at bedtime.     [provider]  aspirin 81 MG tablet Take 81 mg by mouth daily.     [provider]  azaTHIOprine (IMURAN) 50 MG tablet Take 50 mg by mouth every Monday, Wednesday, and Friday with hemodialysis.     [provider]  calcium acetate (PHOSLO) 667 MG capsule Take 1,334 mg by mouth 3 (three) times daily before meals.     [provider]  cycloSPORINE modified (GENGRAF) 25 MG capsule Take 75-100 mg by mouth See admin  instructions. Take 3 capsules in the morning and take 4 capsules at night    [provider]  dorzolamidel-timolol (COSOPT PF) 22.3-6.8 MG/ML SOLN ophthalmic solution Place 1 drop into both eyes 2 (two) times daily.    [provider]  fluticasone (FLONASE) 50 MCG/ACT nasal spray Place 2 sprays into the nose daily. Patient taking differently: Place 2 sprays into the nose daily as needed for allergies.  03/05/13   Wenda Low, MD  Immune Globulin 10% (PRIVIGEN) 20 GM/200ML SOLN Inject 0.5 g/kg into the vein every 3 (three) months.     [provider]  insulin regular (NOVOLIN R) 100 units/mL injection Inject 5-6 Units into the skin See admin instructions. Use 5 units every morning, use 6 units at lunch and dinner and Additional units (per sliding scale): BGL 201-250 = 1 unit; 251-300 = 2 units; 301-350 = 3 units; 351-400 = units; >401 = 5 units + CALL LUNG COORDINATOR @ [facility]    [provider]  midodrine (PROAMATINE) 5 MG tablet Take  5-10 mg by mouth See admin instructions. Take 2 tablets before dialysis and 1 tablet at night if needed 11/18/19   [provider]  Multiple Vitamin (MULTIVITAMIN) capsule Take 1 capsule by mouth as directed. Take only on (Sun, Tues, Thurs, Sat)    [provider]  omeprazole (PRILOSEC) 40 MG capsule Take 40 mg by mouth in the morning and at bedtime.  11/12/18   [provider]  polyvinyl alcohol-povidone (REFRESH) 1.4-0.6 % ophthalmic solution Place 1-2 drops into both eyes daily as needed (for dryness).    [provider]  pravastatin (PRAVACHOL) 20 MG tablet Take 20 mg by mouth at bedtime.     [provider]  prednisoLONE acetate (PRED FORTE) 1 % ophthalmic suspension Place 1 drop into both eyes in the morning and at bedtime.  08/01/19   [provider]  predniSONE (DELTASONE) 5 MG tablet Take 5 mg by mouth daily with breakfast.    [provider]  pyridOXINE (VITAMIN B-6)  100 MG tablet Take 100 mg by mouth daily.    [provider]  sulfamethoxazole-trimethoprim (BACTRIM,SEPTRA) 400-80 MG tablet Take 1 tablet by mouth See admin instructions. Take one tablet by mouth every Monday, Wednesday, and Friday after dialysis 02/27/17   [provider]  traMADol (ULTRAM) 50 MG tablet Take 25 mg by mouth every 12 (twelve) hours as needed for moderate pain.  11/20/19   [provider]  valGANciclovir (VALCYTE) 450 MG tablet Take 1 tablet by mouth See admin instructions. Take 1 tablet by mouth every Monday and Friday  After dialysis 03/26/15   [provider]    Allergies    Levofloxacin and Nsaids  Review of Systems   Review of Systems  Unable to perform ROS: Mental status change  Constitutional:       Per HPI otherwise negative  HENT:       Per HPI otherwise negative  Eyes: Negative for discharge.  Respiratory: Positive for cough.   Cardiovascular: Negative for chest pain.  Gastrointestinal: Negative.   Endocrine: Negative.   Genitourinary:       Anuric  Musculoskeletal: Negative.   Skin: Negative.   Allergic/Immunologic: Positive for immunocompromised state.  Neurological: Positive for weakness.    Physical Exam Updated Vital Signs BP 102/73 (BP Location: Right Arm)   Pulse 96   Temp (!) 101 F (38.3 C) (Oral)   Resp (!) 25   Ht 5\' 3"  (1.6 m)   Wt 67 kg   SpO2 97%   BMI 26.17 kg/m   Physical Exam Constitutional:      Appearance: He is ill-appearing.  HENT:     Head: Normocephalic and atraumatic.  Eyes:     General:        Right eye: No discharge.        Left eye: No discharge.     Pupils: Pupils are equal, round, and reactive to light.  Cardiovascular:     Rate and Rhythm: Regular rhythm. Tachycardia present.     Pulses: Normal pulses.     Heart sounds: Murmur heard.   Pulmonary:     Effort: Tachypnea present.     Breath sounds: Decreased breath sounds present.  Abdominal:     General: There is no  distension.     Tenderness: There is no abdominal tenderness. There is no guarding.  Musculoskeletal:     Cervical back: Normal range of motion and neck supple.     Comments: L upper arm fistula, unremarkable  Skin:    General: Skin is warm and dry.  Neurological:     Motor: Atrophy present. No abnormal muscle tone.     Coordination: Coordination normal.     Comments: Withdrawn, but follows commands appropriately, speaks briefly, with clear voice.  Mild disorientation, initially stating that he is at [facility] rather than here, otherwise within normal limits.  Psychiatric:        Mood and Affect: Mood is depressed.        Behavior: Behavior is slowed and withdrawn.      ED Results / Procedures / Treatments   Labs (all labs ordered are listed, but only abnormal results are displayed) Labs Reviewed  LACTIC ACID, PLASMA - Abnormal; Notable for the following components:      Result Value   Lactic Acid, Venous 2.6 (*)    All other components within normal limits  COMPREHENSIVE METABOLIC PANEL - Abnormal; Notable for the following components:   Potassium 3.3 (*)    Chloride 93 (*)    Glucose, Bld 136 (*)    Creatinine, Ser 4.70 (*)    Calcium 8.1 (*)    Total Protein 6.4 (*)    Albumin 3.0 (*)    AST 72 (*)    Alkaline Phosphatase 452 (*)    Total Bilirubin 1.8 (*)    GFR, Estimated 13 (*)    Anion gap 19 (*)    All other components within normal limits  CBC WITH DIFFERENTIAL/PLATELET - Abnormal; Notable for the following components:   WBC 2.7 (*)    RBC 3.84 (*)    MCV 110.7 (*)    MCH 34.4 (*)    Platelets 128 (*)    Neutro Abs 1.3 (*)    Lymphs Abs 0.5 (*)    Abs Immature Granulocytes 0.17 (*)    All other components within normal limits  C-REACTIVE PROTEIN - Abnormal; Notable for the following components:   CRP 9.6 (*)    All other components within normal limits  CBG MONITORING, ED - Abnormal; Notable for the following components:   Glucose-Capillary 117 (*)     All other components within normal limits  SARS CORONAVIRUS 2 BY RT PCR (HOSPITAL ORDER, [facility])  CULTURE, BLOOD (ROUTINE X 2)  CULTURE, BLOOD (ROUTINE X 2)  URINE CULTURE  RESPIRATORY PANEL BY PCR  LACTIC ACID, PLASMA  PROTIME-INR  APTT  PROTIME-INR  CORTISOL-AM, BLOOD  PROCALCITONIN  COMPREHENSIVE METABOLIC PANEL  CBC  URINALYSIS, ROUTINE W REFLEX MICROSCOPIC    EKG EKG Interpretation  Date/Time:  Friday April 24 2020 16:11:24 EST Ventricular Rate:  94 PR Interval:    QRS Duration: 139 QT Interval:  401 QTC Calculation: 502 R Axis:   -10 Text Interpretation: Sinus rhythm Probable left atrial enlargement Right bundle branch block Abnormal ekg Confirmed by Carmin Muskrat [phone]) on 04/24/2020 4:24:36 PM   Radiology CT Head Wo Contrast  Result Date: 04/24/2020 CLINICAL DATA:  Delirium. EXAM: CT HEAD WITHOUT CONTRAST TECHNIQUE: Contiguous axial images were obtained from the base of the skull through the vertex without intravenous contrast. COMPARISON:  Brain MRI 02/05/2019.  Head CT 02/05/2019. FINDINGS: Brain: The examination is significantly motion degraded at the level of the skull base, orbits and maxillofacial structures. Mild generalized cerebral atrophy. Mild chronic small vessel ischemic disease was better appreciated on the prior MRI of 02/05/2019, including a tiny chronic lacunar infarct within the left caudate nucleus. There is no acute intracranial hemorrhage. No demarcated cortical  infarct. No extra-axial fluid collection. No evidence of intracranial mass. No midline shift. Vascular: No hyperdense vessel.  Atherosclerotic calcifications. Skull: Normal. Negative for fracture or focal lesion. Sinuses/Orbits: No acute orbital abnormality is identified. Partial opacification of the bilateral ethmoid air cells and right sphenoid sinus. IMPRESSION: The examination is significantly motion degraded at the level of the skull base, orbits and  maxillofacial structures. No evidence of acute intracranial abnormality. Mild cerebral atrophy and chronic small vessel ischemic disease. Ethmoid and right sphenoid sinusitis. Electronically Signed   By: Kellie Simmering DO   On: 04/24/2020 18:14   DG Chest Portable 1 View  Result Date: 04/24/2020 CLINICAL DATA:  69 year old male with sudden onset lethargy. Dialysis earlier today. Fever and cough for almost 1 week. EXAM: PORTABLE CHEST 1 VIEW COMPARISON:  Chest CT 01/08/2020 and earlier. FINDINGS: Portable AP semi upright view at 1514 hours. Chronic severe left lung disease with architectural distortion and volume loss does not appear significantly changed since August. Stable left brachiocephalic and subclavian venous vascular stents. Anterior right rib cerclage wire and surgical clips about the right hilum are stable. The right lung appears stable and clear aside from chronic pleural thickening at the base. No pneumothorax or acute pulmonary opacity. Negative visible bowel gas pattern. No acute osseous abnormality identified. IMPRESSION: Stable chronic left lung disease. No superimposed acute cardiopulmonary abnormality identified. Electronically Signed   By: Genevie Ann M.D.   On: 04/24/2020 15:46    Procedures Procedures (including critical care time)  Medications Ordered in ED Medications  acetaminophen (TYLENOL) tablet 650 mg (has no administration in time range)  ceFEPIme (MAXIPIME) 2 g in sodium chloride 0.9 % 100 mL IVPB (has no administration in time range)  acetaminophen (TYLENOL) tablet 650 mg (has no administration in time range)  vancomycin (VANCOREADY) IVPB 1250 mg/250 mL (has no administration in time range)  lactated ringers bolus 1,000 mL (has no administration in time range)    ED Course  I have reviewed the triage vital signs and the nursing notes.  Pertinent labs & imaging results that were available during my care of the patient were reviewed by me and considered in my medical  decision making (see chart for details).     Patient seen and evaluated by the physician assistant prior to my initial interview. With concern for sepsis given the patient's altered mental status, fever, tachypnea, tachycardia he received broad-spectrum antibiotics.  Liter fluid started, will require judicious provision secondary to his ESRD.  Update:, Patient remains unclear about the same.  Hemodynamically has improved somewhat following initial fluid resuscitation, given his dialysis, additional fluids will be held as his map is now greater than 70.  Labs reviewed, notable for leukopenia, evidence for ESRD, no lactic acidosis.  Patient's abdominal exam remains unremarkable, no clear source for his fever, SIRS. Patient has had resuscitation with fluids, as above, vancomycin, cefepime, will require admission for ongoing monitoring, management.  MDM Rules/Calculators/A&P MDM Number of Diagnoses or Management Options Severe sepsis ([place]): new, needed workup   Amount and/or Complexity of Data Reviewed Clinical lab tests: reviewed Tests in the radiology section of CPT: reviewed Tests in the medicine section of CPT: reviewed Decide to obtain previous medical records or to obtain history from someone other than the patient: yes Obtain history from someone other than the patient: yes Review and summarize past medical records: yes Discuss the patient with other providers: yes Independent visualization of images, tracings, or specimens: yes  Risk of Complications, Morbidity, and/or Mortality Presenting  problems: high Diagnostic procedures: high Management options: high  Critical Care Total time providing critical care: 30-74 minutes (35)  Patient Progress Patient progress: improved  Final Clinical Impression(s) / ED Diagnoses Final diagnoses:  Severe sepsis [facility])     Carmin Muskrat, MD 04/24/20 2126

## 2020-04-24 NOTE — ED Notes (Signed)
Notified pt's primary nurse and Dr Vanita Panda of critical lab, lactic 2.6

## 2020-04-24 NOTE — Progress Notes (Signed)
11/19 Elink tracking Code Sepsis bundle.

## 2020-04-24 NOTE — ED Notes (Signed)
Pt to remain NPO at this time to allow for [place] to be completed properly.

## 2020-04-24 NOTE — ED Triage Notes (Signed)
Pt arrives via [facility] from home for c/o sudden onset of lethargy while eating lunch, pt had full dialysis session today. Fever and cough since Saturday, 92% on room air, 96% on 3L, 151 CBG. A/o4.

## 2020-04-24 NOTE — Progress Notes (Signed)
Pharmacy Antibiotic Note  [name] is a 69 y.o. male admitted on 04/24/2020 with sepsis.  Pharmacy has been consulted for Cefepime and Vancomycin dosing.   Height: 5\' 3"  (160 cm) Weight: 67 kg (147 lb 11.3 oz) IBW/kg (Calculated) : 56.9  Temp (24hrs), Avg:101.7 F (38.7 C), Min:101 F (38.3 C), Max:102.1 F (38.9 C)  Recent Labs  Lab 04/24/20 1437  WBC 2.7*  CREATININE 4.70*  LATICACIDVEN 2.6*    Estimated Creatinine Clearance: 11.6 mL/min (A) (by C-G formula based on SCr of 4.7 mg/dL (H)).    Allergies  Allergen Reactions  . Levofloxacin Other (See Comments)    LOSS OF CONSCIOUSNESS  . Nsaids Other (See Comments)    Patient instructed not to take NSAID's after his lung transplant    Antimicrobials this admission: 11/19 Cefepime >>  11/19 Vancomycin >>   Dose adjustments this admission: N/a  Microbiology results: Pending   Plan:  - Cefepime 1g IV q24h - Vancomycin 1250mg  IV x 1 dose  - Followed by Vancomycin 750mg  IV qMWF after HD  - Monitor patients renal function and urine output and HD sessions  - De-escalate ABX when appropriate   Thank you for allowing pharmacy to be a part of this patient's care.  Duanne Limerick PharmD. BCPS 04/24/2020 4:01 PM

## 2020-04-24 NOTE — H&P (Signed)
History and Physical   [name] [name]:[id number] DOB: May 27, 1951 DOA: 04/24/2020  PCP: Wenda Low, MD   Patient coming from: Home  Chief Complaint: Lethargy, confusion  HPI: [name] is a 69 y.o. male with medical history significant of ESRD HD MWF, chronic respiratory failure, diabetes, hypertension, GERD, cirrhosis, ILD status post lung transplant, OSA on CPAP who presents with lethargy and confusion.  Patient's confusion was clearing at the time he was seen and he was able to participate in HPI.  He reports he has had about 1 week of cough and sore throat with associated lethargy.  He does not report confusion but this was noted when he presented to the ED he was alert to self and time but not place this has improved and he is now alert and oriented x3 but does remain lethargic and easily falls asleep.  His symptoms worsened today following his dialysis session this morning.  He reports having something done to his left lower extremity fistula, but this area is without any signs of erythema or infection and does not appear to have had any recent interventions.  He denies fever, chills, chest pain, shortness of breath, abdominal pain, constipation, diarrhea, nausea, vomiting.  Of note patient is status post lung transplant is on multiple immunosuppressive agents.  Wife contacted by phone after initial evaluation and reports he has had increased frequency of BMs and they have also been softer.  ED Course: When patient presented he was tachycardic in the 90s to 100s, tachypneic in the 20s, febrile to 102.  His blood pressure was intermittently soft in the 56L to 87F systolic but this improved to 64P to 329J systolic with 1 L of IV fluids.  His lab work showed K3.3, gap 19, creatinine 4.7 consistent with ESRD.  CBC showed WBC 2.7, platelets 128.  LFTs showed AST elevated to 72, ALP elevated to 450 from 200, T bili 1.8.  Lactic acid initially 2.6 with repeat pending.  PT PTT and  INR within normal limits, as were apparent for flu and Covid was negative.  UA, urine culture, blood culture ordered but not yet obtained or resulted.  CT head without acute abnormality that was motion degraded, chest x-ray was without acute abnormality.  Code sepsis was called in ED, patient was initiated vancomycin, cefepime, and started on Flagyl.  Review of Systems: As per HPI otherwise all other systems reviewed and are negative.  Past Medical History:  Diagnosis Date  . Aortic valve disorders   . Benign neoplasm of colon   . Degeneration of intervertebral disc, site unspecified   . Diabetes mellitus without complication ([place])   . 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 Esophageal reflux   . ESRD (end stage renal disease) on dialysis ([place])   . Essential hypertension   . Obstructive sleep apnea (adult) (pediatric)   . Osteoarthrosis, unspecified whether generalized or localized, unspecified site   . Other and unspecified hyperlipidemia   . Pneumonia    interstitial pneumonia  . Pulmonary fibrosis ([place])   . Renal disorder   . Respiratory failure with hypoxia ([place]) 12/2015  . Transplanted, lung ([place])   . Unspecified essential hypertension     Past Surgical History:  Procedure Laterality Date  . COLONOSCOPY WITH PROPOFOL N/A 11/07/2017   Procedure: COLONOSCOPY WITH PROPOFOL;  Surgeon: Ronnette Juniper, MD;  Location: [facility];  Service: Gastroenterology;  Laterality: N/A;  . LIGATION OF ARTERIOVENOUS  FISTULA Left 01/14/2019   Procedure:  LIGATION OF ARTERIOVENOUS  FISTULA;  Surgeon: Marty Heck, MD;  Location: [facility];  Service: Vascular;  Laterality: Left;  . LUNG BIOPSY  2010  . LUNG TRANSPLANT, SINGLE Right   . POLYPECTOMY  11/07/2017   Procedure: POLYPECTOMY;  Surgeon: Ronnette Juniper, MD;  Location: [facility];  Service: Gastroenterology;;    Social History  reports that he quit smoking about 17 years ago. His smoking use included cigarettes. He  has a 6.00 pack-year smoking history. He has never used smokeless tobacco. He reports that he does not drink alcohol and does not use drugs.  Allergies  Allergen Reactions  . Levofloxacin Other (See Comments)    LOSS OF CONSCIOUSNESS  . Nsaids Other (See Comments)    Patient instructed not to take NSAID's after his lung transplant    Family History  Problem Relation Age of Onset  . Pancreatic cancer Brother   . Heart disease Father   Reviewed on admission  Prior to Admission medications   Medication Sig Start Date End Date Taking? Authorizing Provider  ACCU-CHEK FASTCLIX LANCETS MISC As directed up to 4 times daily 12/16/15   [provider]  acetaminophen (TYLENOL) 500 MG tablet Take 500-1,000 mg by mouth every 6 (six) hours as needed for moderate pain or headache.     [provider]  Alpha Lipoic Acid 200 MG CAPS Take 200 mg by mouth in the morning and at bedtime.     [provider]  aspirin 81 MG tablet Take 81 mg by mouth daily.     [provider]  azaTHIOprine (IMURAN) 50 MG tablet Take 50 mg by mouth every Monday, Wednesday, and Friday with hemodialysis.     [provider]  calcium acetate (PHOSLO) 667 MG capsule Take 1,334 mg by mouth 3 (three) times daily before meals.     [provider]  cycloSPORINE modified (GENGRAF) 25 MG capsule Take 75-100 mg by mouth See admin instructions. Take 3 capsules in the morning and take 4 capsules at night    [provider]  dorzolamidel-timolol (COSOPT PF) 22.3-6.8 MG/ML SOLN ophthalmic solution Place 1 drop into both eyes 2 (two) times daily.    [provider]  fluticasone (FLONASE) 50 MCG/ACT nasal spray Place 2 sprays into the nose daily. Patient taking differently: Place 2 sprays into the nose daily as needed for allergies.  03/05/13   Wenda Low, MD  Immune Globulin 10% (PRIVIGEN) 20 GM/200ML SOLN Inject 0.5 g/kg into the vein every 3 (three) months.      [provider]  insulin regular (NOVOLIN R) 100 units/mL injection Inject 5-6 Units into the skin See admin instructions. Use 5 units every morning, use 6 units at lunch and dinner and Additional units (per sliding scale): BGL 201-250 = 1 unit; 251-300 = 2 units; 301-350 = 3 units; 351-400 = units; >401 = 5 units + CALL LUNG COORDINATOR @ [facility]    [provider]  midodrine (PROAMATINE) 5 MG tablet Take 5-10 mg by mouth See admin instructions. Take 2 tablets before dialysis and 1 tablet at night if needed 11/18/19   [provider]  Multiple Vitamin (MULTIVITAMIN) capsule Take 1 capsule by mouth as directed. Take only on (Sun, Tues, Thurs, Sat)    [provider]  omeprazole (PRILOSEC) 40 MG capsule Take 40 mg by mouth in the morning and at bedtime.  11/12/18   [provider]  polyvinyl alcohol-povidone (REFRESH) 1.4-0.6 % ophthalmic solution Place 1-2 drops into  both eyes daily as needed (for dryness).    [provider]  pravastatin (PRAVACHOL) 20 MG tablet Take 20 mg by mouth at bedtime.     [provider]  prednisoLONE acetate (PRED FORTE) 1 % ophthalmic suspension Place 1 drop into both eyes in the morning and at bedtime.  08/01/19   [provider]  predniSONE (DELTASONE) 5 MG tablet Take 5 mg by mouth daily with breakfast.    [provider]  pyridOXINE (VITAMIN B-6) 100 MG tablet Take 100 mg by mouth daily.    [provider]  sulfamethoxazole-trimethoprim (BACTRIM,SEPTRA) 400-80 MG tablet Take 1 tablet by mouth See admin instructions. Take one tablet by mouth every Monday, Wednesday, and Friday after dialysis 02/27/17   [provider]  traMADol (ULTRAM) 50 MG tablet Take 25 mg by mouth every 12 (twelve) hours as needed for moderate pain.  11/20/19   [provider]  valGANciclovir (VALCYTE) 450 MG tablet Take 1 tablet by mouth See admin instructions. Take 1 tablet by mouth every Monday and  Friday  After dialysis 03/26/15   [provider]    Physical Exam: Vitals:   04/24/20 2008 04/24/20 2045 04/24/20 2115 04/24/20 2141  BP: 102/64 116/67 (!) 101/50 (!) 108/57  Pulse: 78 80 77 76  Resp: (!) _0 (!) 21  Temp: 98.9 F (37.2 C)   99 F (37.2 C)  TempSrc: Oral   Oral  SpO2: 98% 100% 97% 98%  Weight:      Height:       Physical Exam Constitutional:      General: He is not in acute distress.    Appearance: Normal appearance.     Comments: drowsy  HENT:     Head: Normocephalic and atraumatic.     Mouth/Throat:     Mouth: Mucous membranes are moist.     Pharynx: Oropharynx is clear.  Eyes:     Extraocular Movements: Extraocular movements intact.     Pupils: Pupils are equal, round, and reactive to light.  Cardiovascular:     Rate and Rhythm: Normal rate and regular rhythm.     Pulses: Normal pulses.     Heart sounds: Murmur heard.   Pulmonary:     Effort: Pulmonary effort is normal. No respiratory distress.     Comments: Crackles at bases bilaterally Abdominal:     General: Bowel sounds are normal. There is no distension.     Palpations: Abdomen is soft.     Tenderness: There is no abdominal tenderness.  Musculoskeletal:        General: No swelling or deformity.     Comments: Fistula LUE, good thrill  Skin:    General: Skin is warm and dry.  Neurological:     General: No focal deficit present.     Mental Status: He is oriented to person, place, and time. Mental status is at baseline.    Labs on Admission: I have personally reviewed following labs and imaging studies  CBC: Recent Labs  Lab 04/24/20 1437  WBC 2.7*  NEUTROABS 1.3*  HGB 13.2  HCT 42.5  MCV 110.7*  PLT 128*    Basic Metabolic Panel: Recent Labs  Lab 04/24/20 1437  NA 139  K 3.3*  CL 93*  CO2 27  GLUCOSE 136*  BUN 13  CREATININE 4.70*  CALCIUM 8.1*    GFR: Estimated Creatinine Clearance: 11.6 mL/min (A) (by C-G formula based on SCr of 4.7 mg/dL  (H)).  Liver  Function Tests: Recent Labs  Lab 04/24/20 1437  AST 72*  ALT 36  ALKPHOS 452*  BILITOT 1.8*  PROT 6.4*  ALBUMIN 3.0*    Urine analysis:    Component Value Date/Time   COLORURINE AMBER (A) 12/31/2015 0515   APPEARANCEUR CLOUDY (A) 12/31/2015 0515   LABSPEC 1.016 12/31/2015 0515   PHURINE 7.0 12/31/2015 0515   GLUCOSEU NEGATIVE 12/31/2015 0515   HGBUR NEGATIVE 12/31/2015 0515   BILIRUBINUR SMALL (A) 12/31/2015 0515   KETONESUR 15 (A) 12/31/2015 0515   PROTEINUR 100 (A) 12/31/2015 0515   UROBILINOGEN 2.0 (H) 08/15/2013 1916   NITRITE NEGATIVE 12/31/2015 0515   LEUKOCYTESUR NEGATIVE 12/31/2015 0515    Radiological Exams on Admission: CT Head Wo Contrast  Result Date: 04/24/2020 CLINICAL DATA:  Delirium. EXAM: CT HEAD WITHOUT CONTRAST TECHNIQUE: Contiguous axial images were obtained from the base of the skull through the vertex without intravenous contrast. COMPARISON:  Brain MRI 02/05/2019.  Head CT 02/05/2019. FINDINGS: Brain: The examination is significantly motion degraded at the level of the skull base, orbits and maxillofacial structures. Mild generalized cerebral atrophy. Mild chronic small vessel ischemic disease was better appreciated on the prior MRI of 02/05/2019, including a tiny chronic lacunar infarct within the left caudate nucleus. There is no acute intracranial hemorrhage. No demarcated cortical infarct. No extra-axial fluid collection. No evidence of intracranial mass. No midline shift. Vascular: No hyperdense vessel.  Atherosclerotic calcifications. Skull: Normal. Negative for fracture or focal lesion. Sinuses/Orbits: No acute orbital abnormality is identified. Partial opacification of the bilateral ethmoid air cells and right sphenoid sinus. IMPRESSION: The examination is significantly motion degraded at the level of the skull base, orbits and maxillofacial structures. No evidence of acute intracranial abnormality. Mild cerebral atrophy and chronic small  vessel ischemic disease. Ethmoid and right sphenoid sinusitis. Electronically Signed   By: Kellie Simmering DO   On: 04/24/2020 18:14   DG Chest Portable 1 View  Result Date: 04/24/2020 CLINICAL DATA:  69 year old male with sudden onset lethargy. Dialysis earlier today. Fever and cough for almost 1 week. EXAM: PORTABLE CHEST 1 VIEW COMPARISON:  Chest CT 01/08/2020 and earlier. FINDINGS: Portable AP semi upright view at 1514 hours. Chronic severe left lung disease with architectural distortion and volume loss does not appear significantly changed since August. Stable left brachiocephalic and subclavian venous vascular stents. Anterior right rib cerclage wire and surgical clips about the right hilum are stable. The right lung appears stable and clear aside from chronic pleural thickening at the base. No pneumothorax or acute pulmonary opacity. Negative visible bowel gas pattern. No acute osseous abnormality identified. IMPRESSION: Stable chronic left lung disease. No superimposed acute cardiopulmonary abnormality identified. Electronically Signed   By: Genevie Ann M.D.   On: 04/24/2020 15:46    EKG: Independently reviewed.  Sinus rhythm, 94 bpm.  Right bundle branch block.  Assessment/Plan Principal Problem:   Sepsis ([place]) Active Problems:   GERD without esophagitis   Chronic respiratory failure (HCC)   ILD (interstitial lung disease) ([place])   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[place])   Immunocompromised state ([place])   Mixed diabetic hyperlipidemia associated with type 2 diabetes mellitus ([place])   Hepatic cirrhosis ([place])   Uncontrolled type 2 diabetes mellitus with hyperglycemia, with long-term current use of insulin ([place])   Acute diarrhea  SEPSIS, Unknown source > Patient met sepsis criteria on admission with fever, leukopenia, tachycardia, tachypnea.  Symptoms worsened after HD, likely volume removal pushed over the head. > This is in the setting of chronic immunosuppression  with prednisone, cyclosporine, azathioprine  as patient is status post lung transplant > Started on broad-spectrum antibiotics in ED, chest x-ray without source disease, urine analysis and culture ordered but patient does not make urine, blood cultures ordered > No neck stiffness, no abdominal pain, no suprapubic tenderness > Status post 1 L in ED only as patient is ESRD and maps are holding greater than 65, can consider additional small bolus if needed but may need consult critical care if has persistent hypotension > Wife reports he has had softer stools with increase frequency for several weeks - Continue broad-spectrum antibiotics with vancomycin, cefepime, Flagyl - Continuing home prednisone, azathioprine, cyclosporine - Check full viral panel - Check ultrasound of right upper quadrant for completeness given worsening elevations in LFTs - Add on GI pathogen panel and C. difficile given history of C. difficile and wife reports of changes in BMs - Monitor fever curve and white count  ESRD HD MWF > Completed HD on day of admission > Received 1 L IV fluids in ED due to soft blood pressure > Blood pressure is now in the 37C to 588 systolic which is his baseline. - Will require nephrology consult if staying through Monday or other needs for dialysis - Continue home midodrine, PhosLo  Chronic respiratory failure ILD status post lung transplant > Uses 3 L of oxygen only with dialysis, not at home > Transplant done at and patient follows with [facility] - Continue home prednisone, azathioprine, cyclosporine - Continue submental oxygen as needed  Diabetes - SSI  GERD - Continue PPI   Cirrhosis > Elevation in LFTs with AST 72, ALP 450 (baseline around 200), T bili 1.8.  Normal PT/INR > No abdominal pain - Continue supportive care - Checking right quadrant ultrasound as above  OSA on CPAP - Continue CPAP  DVT prophylaxis: Heparin Code Status:   Full  Family Communication:  Wife, Molli Posey, contacted by phone at [phone].     Disposition Plan:   Patient is from:  Home  Anticipated DC to:  Home  Anticipated DC date:  Pending clinical course  Anticipated DC barriers: ESRD patient  Consults called:  None, will need nephrology if stays through Monday or other needs for diuresis Admission status:  Inpatient, progressive  Severity of Illness: The appropriate patient status for this patient is INPATIENT. Inpatient status is judged to be reasonable and necessary in order to provide the required intensity of service to ensure the patient's safety. The patient's presenting symptoms, physical exam findings, and initial radiographic and laboratory data in the context of their chronic comorbidities is felt to place them at high risk for further clinical deterioration. Furthermore, it is not anticipated that the patient will be medically stable for discharge from the hospital within 2 midnights of admission. The following factors support the patient status of inpatient.   " The patient's presenting symptoms include lethargy, confusion. " The worrisome physical exam findings include soft blood pressure, initial tachycardia, tachypnea.. " The initial radiographic and laboratory data are worrisome because of initially elevated lactic acid to 2.6.  Elevated LFTs.  Leukopenia 2.7.  Stable thrombocytopenia.. " The chronic co-morbidities include ESRD, interstitial lung disease status post lung transplant on immunosuppression, cirrhosis, hypertension, diabetes, GERD, OSA.   * I certify that at the point of admission it is my clinical judgment that the patient will require inpatient hospital care spanning beyond 2 midnights from the point of admission due to high intensity of service, high risk for further deterioration and high  frequency of surveillance required.Marcelyn Bruins MD [facility]  How to contact the [facility] Attending or Consulting provider [place] or covering provider during after hours [place], for this  patient?   1. Check the care team in [facility] and look for a) attending/consulting [facility] provider listed and b) the [facility] team listed 2. Log into [email or web address] and use [facility]'s universal password to access. If you do not have the password, please contact the hospital operator. 3. Locate the [facility] provider you are looking for under [facility] and page to a number that you can be directly reached. 4. If you still have difficulty reaching the provider, please page the [facility] (Director on Call) for the Hospitalists listed on amion for assistance.  04/24/2020, 9:43 PM

## 2020-04-24 NOTE — ED Notes (Addendum)
Assuming care of patient at this time. Pt is lethargic and unable to keep eyes open. Weak, dry cough assessed. Pt is an ESRD dialysis patient (M,W, F). Rec'd full treatment today in L AV fistula. Pt chronically on 3L O2 at home. Pt is has c/o increased SOB since last Saturday. Pt fully vaccinated. With booster. Skin hot to the touch (Febrile at 101 farenheit). HR 98 beats Afib on monitor.

## 2020-04-25 DIAGNOSIS — E1169 Type 2 diabetes mellitus with other specified complication: Secondary | ICD-10-CM

## 2020-04-25 DIAGNOSIS — K219 Gastro-esophageal reflux disease without esophagitis: Secondary | ICD-10-CM

## 2020-04-25 DIAGNOSIS — J9611 Chronic respiratory failure with hypoxia: Secondary | ICD-10-CM

## 2020-04-25 DIAGNOSIS — J211 Acute bronchiolitis due to human metapneumovirus: Secondary | ICD-10-CM

## 2020-04-25 DIAGNOSIS — Z942 Lung transplant status: Secondary | ICD-10-CM

## 2020-04-25 DIAGNOSIS — R197 Diarrhea, unspecified: Secondary | ICD-10-CM | POA: Diagnosis not present

## 2020-04-25 DIAGNOSIS — K7460 Unspecified cirrhosis of liver: Secondary | ICD-10-CM

## 2020-04-25 DIAGNOSIS — E782 Mixed hyperlipidemia: Secondary | ICD-10-CM

## 2020-04-25 LAB — CBC
HCT: 34.6 % — ABNORMAL LOW (ref 39.0–52.0)
Hemoglobin: 11.1 g/dL — ABNORMAL LOW (ref 13.0–17.0)
MCH: 33.9 pg (ref 26.0–34.0)
MCHC: 32.1 g/dL (ref 30.0–36.0)
MCV: 105.8 fL — ABNORMAL HIGH (ref 80.0–100.0)
Platelets: 122 10*3/uL — ABNORMAL LOW (ref 150–400)
RBC: 3.27 MIL/uL — ABNORMAL LOW (ref 4.22–5.81)
RDW: 14.9 % (ref 11.5–15.5)
WBC: 2.4 10*3/uL — ABNORMAL LOW (ref 4.0–10.5)
nRBC: 0 % (ref 0.0–0.2)

## 2020-04-25 LAB — RESPIRATORY PANEL BY PCR

## 2020-04-25 LAB — GASTROINTESTINAL PANEL BY PCR, STOOL (REPLACES STOOL CULTURE)

## 2020-04-25 LAB — GLUCOSE, CAPILLARY
Glucose-Capillary: 75 mg/dL (ref 70–99)
Glucose-Capillary: 156 mg/dL — ABNORMAL HIGH (ref 70–99)
Glucose-Capillary: 161 mg/dL — ABNORMAL HIGH (ref 70–99)
Glucose-Capillary: 142 mg/dL — ABNORMAL HIGH (ref 70–99)

## 2020-04-25 LAB — MRSA PCR SCREENING: MRSA by PCR: NEGATIVE

## 2020-04-25 LAB — COMPREHENSIVE METABOLIC PANEL
ALT: 26 U/L (ref 0–44)
AST: 45 U/L — ABNORMAL HIGH (ref 15–41)
Albumin: 2.4 g/dL — ABNORMAL LOW (ref 3.5–5.0)
Alkaline Phosphatase: 340 U/L — ABNORMAL HIGH (ref 38–126)
Anion gap: 16 — ABNORMAL HIGH (ref 5–15)
BUN: 25 mg/dL — ABNORMAL HIGH (ref 8–23)
CO2: 28 mmol/L (ref 22–32)
Calcium: 7.9 mg/dL — ABNORMAL LOW (ref 8.9–10.3)
Chloride: 94 mmol/L — ABNORMAL LOW (ref 98–111)
Creatinine, Ser: 6.37 mg/dL — ABNORMAL HIGH (ref 0.61–1.24)
GFR, Estimated: 9 mL/min — ABNORMAL LOW (ref >60)
Glucose, Bld: 82 mg/dL (ref 70–99)
Potassium: 3.3 mmol/L — ABNORMAL LOW (ref 3.5–5.1)
Sodium: 138 mmol/L (ref 135–145)
Total Bilirubin: 2.2 mg/dL — ABNORMAL HIGH (ref 0.3–1.2)
Total Protein: 5.3 g/dL — ABNORMAL LOW (ref 6.5–8.1)

## 2020-04-25 LAB — PROTIME-INR
INR: 1.1 (ref 0.8–1.2)
Prothrombin Time: 13.3 seconds (ref 11.4–15.2)

## 2020-04-25 LAB — CORTISOL-AM, BLOOD: Cortisol - AM: 13.9 ug/dL (ref 6.7–22.6)

## 2020-04-25 LAB — PROCALCITONIN: Procalcitonin: 4.57 ng/mL

## 2020-04-25 MED ORDER — CHLORHEXIDINE GLUCONATE CLOTH 2 % EX PADS
6.0000 | MEDICATED_PAD | Freq: Every day | CUTANEOUS | Status: DC
  Administered 2020-04-26 – 2020-04-27 (×2): 6 via TOPICAL

## 2020-04-25 MED ORDER — SALINE SPRAY 0.65 % NA SOLN
1.0000 | NASAL | Status: DC | PRN
  Filled 2020-04-25: qty 44

## 2020-04-25 MED ORDER — CYCLOSPORINE MODIFIED (NEORAL) 25 MG PO CAPS
75.0000 mg | ORAL_CAPSULE | Freq: Every day | ORAL | Status: DC
  Administered 2020-04-25 – 2020-04-27 (×3): 75 mg via ORAL
  Filled 2020-04-25 (×2): qty 3

## 2020-04-25 MED ORDER — CHLORHEXIDINE GLUCONATE CLOTH 2 % EX PADS: 6.0000 | MEDICATED_PAD | Freq: Every day | CUTANEOUS | Status: DC

## 2020-04-25 MED ORDER — GUAIFENESIN-DM 100-10 MG/5ML PO SYRP
5.0000 mL | ORAL_SOLUTION | ORAL | Status: DC | PRN
  Administered 2020-04-25 – 2020-04-26 (×2): 5 mL via ORAL
  Filled 2020-04-25 (×2): qty 5

## 2020-04-25 MED ORDER — IPRATROPIUM-ALBUTEROL 0.5-2.5 (3) MG/3ML IN SOLN
3.0000 mL | RESPIRATORY_TRACT | Status: DC | PRN
  Administered 2020-04-27: 3 mL via RESPIRATORY_TRACT
  Filled 2020-04-25: qty 3

## 2020-04-25 MED ORDER — GERHARDT'S BUTT CREAM
TOPICAL_CREAM | Freq: Every day | CUTANEOUS | Status: DC
  Filled 2020-04-25: qty 1

## 2020-04-25 NOTE — Progress Notes (Signed)
PROGRESS NOTE    [name]  JJO:[id number] DOB: [birth year] DOA: 04/24/2020 PCP: Wenda Low, MD    Brief Narrative:  [name] was admitted to the hospital with a working diagnosis of systemic inflammatory response syndrome, due to metapneumovirus bronchitis (present on admission) rule out for bacterial sepsis.    69 year old male with past medical history for end-stage renal disease on hemodialysis, chronic hypoxic respiratory failure, type 2 diabetes mellitus, hypertension, GERD, cirrhosis, ILD status post lung transplant and obstructive sleep apnea, who presented with lethargy and confusion.  Reported 1 week of dry cough and lethargy, his symptoms worsened after hemodialysis treatment on the day of admission.  On his initial physical examination patient was febrile 102 F, heart rate 90-100, respirate 20, blood pressure systolic in the 16W.  He was somnolent, his lungs had rales at bases bilaterally, heart S1-S2, present, rhythmic, soft abdomen, no lower extremity edema, left upper extremity fistula with good thrill. Sodium 139, potassium 3.3, chloride 93, bicarb 27, glucose 136, BUN 13, creatinine 4.7, alkaline phosphatase 452, AST 72, ALT 36, total bilirubin is 1.8, lactic acid 2.6, white count 2.7, hemoglobin 13.2, hematocrit 42.5, platelets 128.  His respiratory panel positive for metapneumovirus.  SARS COVID-19 negative. Chest radiograph with no infiltrates on the right lung, left lung with increased interstitial markings, mediastinum shifted to the left, chronic.  Assessment & Plan:   Principal Problem:   Acute bronchiolitis due to human metapneumovirus Active Problems:   GERD without esophagitis   Chronic respiratory failure (HCC)   ILD (interstitial lung disease) ([place])   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[place])   Immunocompromised state ([place])   Mixed diabetic hyperlipidemia associated with type 2 diabetes mellitus ([place])   Hepatic cirrhosis ([place])   Uncontrolled type 2 diabetes  mellitus with hyperglycemia, with long-term current use of insulin (HCC)   Acute diarrhea    1. Bronchitis due to metapneumovirus. Patient has ruled out for bacterial sepsis. Patient is feeling better but not yet back to baseline. His chest film has no new infiltrates. Oxymetry is 93% on room air.   Continue supportive medical care with bronchodilator therapy, antitussive agents and airway clearing techniques with flutter valve and incentive spirometer.   2. ILD sp lung transplant (at [facility]). Continue with immunosuppressive medications with prednisone, azathioprine and cyclosporin. Continue supportive oxygen support, and increase mobility with PT/OT and out of bed to chair tid with meals.   Continue prophylactic valganciclovir and bactrim.   3. Cirrhosis of the liver. Stable with no signs of acute decompensation, continue close monitoring.   4. ESRD on HD. Patient euvolemic, continue renal replacement therapy per nephrology recommendations.   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   4. T2DM/ dyslpidemia, Continue glucose cover and monitoring with insulin sliding scale.   Continue with pravastatin.   5. OSA. Continue CPAP.       Status is: Inpatient  Remains inpatient appropriate because:Inpatient level of care appropriate due to severity of illness   Dispo: The patient is from: Home              Anticipated d/c is to: Home              Anticipated d/c date is: 2 days              Patient currently is not medically stable to d/c.   DVT prophylaxis: Heparin   Code Status:   full  Family Communication:  I spoke with patient's wife at the bedside, we talked in  detail about patient's condition, plan of care and prognosis and all questions were addressed.      Consultants:   Nephrology     Subjective: Patient is feeling better, but not yet back to baseline, no nausea or vomiting, no chest pain, positive cough.   Objective: Vitals:   04/24/20 2225  04/25/20 0446 04/25/20 0736 04/25/20 1204  BP: 103/63 (!) 114/59 (!) 91/52 (!) 75/44  Pulse: 78 82 79 78  Resp: 18 20 20 20   Temp: 99 F (37.2 C) 99.5 F (37.5 C) 98.9 F (37.2 C) 98.8 F (37.1 C)  TempSrc: Oral Oral    SpO2: 100% 93% 99% 100%  Weight:      Height:        Intake/Output Summary (Last 24 hours) at 04/25/2020 1344 Last data filed at 04/24/2020 2245 Gross per 24 hour  Intake 1555 ml  Output --  Net 1555 ml   Filed Weights   04/24/20 1500  Weight: 67 kg    Examination:   General: deconditioned  Neurology: Awake and alert, non focal  E ENT: no pallor, no icterus, oral mucosa moist Cardiovascular: No JVD. S1-S2 present, rhythmic, no gallops, rubs, or murmurs. +/++ non pitting lower extremity edema. Pulmonary: positive breath sounds bilaterally, decoreased air movement, no wheezing, but positive left rhonchi and rales. Gastrointestinal. Abdomen soft and non tender Skin. No rashes Musculoskeletal: no joint deformities     Data Reviewed: I have personally reviewed following labs and imaging studies  CBC: Recent Labs  Lab 04/24/20 1437 04/25/20 0401  WBC 2.7* 2.4*  NEUTROABS 1.3*  --   HGB 13.2 11.1*  HCT 42.5 34.6*  MCV 110.7* 105.8*  PLT 128* 245*   Basic Metabolic Panel: Recent Labs  Lab 04/24/20 1437 04/25/20 0401  NA 139 138  K 3.3* 3.3*  CL 93* 94*  CO2 27 28  GLUCOSE 136* 82  BUN 13 25*  CREATININE 4.70* 6.37*  CALCIUM 8.1* 7.9*   GFR: Estimated Creatinine Clearance: 8.6 mL/min (A) (by C-G formula based on SCr of 6.37 mg/dL (H)). Liver Function Tests: Recent Labs  Lab 04/24/20 1437 04/25/20 0401  AST 72* 45*  ALT 36 26  ALKPHOS 452* 340*  BILITOT 1.8* 2.2*  PROT 6.4* 5.3*  ALBUMIN 3.0* 2.4*   No results for input(s): LIPASE, AMYLASE in the last 168 hours. No results for input(s): AMMONIA in the last 168 hours. Coagulation Profile: Recent Labs  Lab 04/24/20 1533 04/25/20 0401  INR 1.0 1.1   Cardiac Enzymes: No  results for input(s): CKTOTAL, CKMB, CKMBINDEX, TROPONINI in the last 168 hours. BNP (last 3 results) No results for input(s): PROBNP in the last 8760 hours. HbA1C: No results for input(s): HGBA1C in the last 72 hours. CBG: Recent Labs  Lab 04/24/20 2030 04/24/20 2232 04/25/20 0952 04/25/20 1205  GLUCAP 117* 101* 75 156*   Lipid Profile: No results for input(s): CHOL, HDL, LDLCALC, TRIG, CHOLHDL, LDLDIRECT in the last 72 hours. Thyroid Function Tests: No results for input(s): TSH, T4TOTAL, FREET4, T3FREE, THYROIDAB in the last 72 hours. Anemia Panel: No results for input(s): VITAMINB12, FOLATE, FERRITIN, TIBC, IRON, RETICCTPCT in the last 72 hours.    Radiology Studies: I have reviewed all of the imaging during this hospital visit personally     Scheduled Meds: . aspirin EC  81 mg Oral Daily  . [START ON 04/27/2020] azaTHIOprine  50 mg Oral Q M,W,F-HD  . calcium acetate  1,334 mg Oral TID AC  . [START ON 04/26/2020]  Chlorhexidine Gluconate Cloth  6 each Topical Q0600  . cycloSPORINE modified  100 mg Oral QHS  . cycloSPORINE modified  75 mg Oral Daily  . dorzolamide-timolol  1 drop Both Eyes BID  . fluorometholone  1 drop Right Eye QID  . heparin  5,000 Units Subcutaneous Q8H  . insulin aspart  0-6 Units Subcutaneous TID WC  . metroNIDAZOLE  500 mg Oral Q8H  . [START ON 04/27/2020] midodrine  10 mg Oral Once per day on Mon Wed Fri  . pantoprazole  40 mg Oral Daily  . pravastatin  10 mg Oral QHS  . predniSONE  5 mg Oral Q breakfast  . [START ON 04/27/2020] sulfamethoxazole-trimethoprim  1 tablet Oral Q M,W,F-HD  . [START ON 04/27/2020] valGANciclovir  450 mg Oral Once per day on Mon Fri   Continuous Infusions: . ceFEPime (MAXIPIME) IV    . [START ON 04/27/2020] vancomycin       LOS: 1 day        Amaru Burroughs Gerome Apley, MD

## 2020-04-25 NOTE — Progress Notes (Signed)
Patient refused the use of CPAP for the evening.  

## 2020-04-25 NOTE — Consult Note (Signed)
[facility] Renal Consultation Note    Indication for Consultation:  Management of ESRD/hemodialysis, anemia, hypertension/volume, and secondary hyperparathyroidism.  HPI: [name] is a 69 y.o. male with past medical history including ESRD on dialysis Monday Wednesday Friday, chronic respiratory failure, diabetes, hypertension, GERD, cirrhosis, status post lung transplant, OSA who presented to the ED on 04/24/20 with confusion and lethargy.  He reports about 1 week of cough and sore throat.  He felt worse after dialysis yesterday so presented to the ED for evaluation.  His wife also reported multiple soft bowel movements.  On arrival, patient noted to be febrile at 102 Fahrenheit, slightly tachycardic and tachypneic, BP soft.  Given 1 L IV fluids.  K3.3, creatinine 4.7, white blood cell 2.7, hemoglobin 11.1, platelets 122.  He was started on vancomycin, cefepime, and Flagyl for presumed sepsis.Respiratory panel positive for metapneumovirus.  Blood cultures pending.  Patient dialyzes on MWF schedule, last dialysis was 04/24/20.Due to the holiday schedule this week, he will be due for dialysis tomorrow.  He is compliant with HD. He takes midodrine pre-HD, dose recently changed to 10mg  pre-HD and 10mg  mid HD as needed. He had an angioplasty at [facility] on 93/71 without complications.   On exam, pt reports he has felt generally unwell for about 1 week. Reports cough, sore throat, nasal congestion, diarrhea, and fatigue. Denies SOB, CP, palpitations, dizziness.   Past Medical History:  Diagnosis Date  . Aortic valve disorders   . Benign neoplasm of colon   . Degeneration of intervertebral disc, site unspecified   . Diabetes mellitus without complication ([place])   . 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 Esophageal reflux   . ESRD (end stage renal disease) on dialysis ([place])   . Essential hypertension   . Obstructive sleep apnea (adult) (pediatric)   .  Osteoarthrosis, unspecified whether generalized or localized, unspecified site   . Other and unspecified hyperlipidemia   . Pneumonia    interstitial pneumonia  . Pulmonary fibrosis ([place])   . Renal disorder   . Respiratory failure with hypoxia ([place]) 12/2015  . Transplanted, lung ([place])   . Unspecified essential hypertension    Past Surgical History:  Procedure Laterality Date  . COLONOSCOPY WITH PROPOFOL N/A 11/07/2017   Procedure: COLONOSCOPY WITH PROPOFOL;  Surgeon: Ronnette Juniper, MD;  Location: [facility];  Service: Gastroenterology;  Laterality: N/A;  . LIGATION OF ARTERIOVENOUS  FISTULA Left 01/14/2019   Procedure: LIGATION OF ARTERIOVENOUS  FISTULA;  Surgeon: Marty Heck, MD;  Location: [place];  Service: Vascular;  Laterality: Left;  . LUNG BIOPSY  2010  . LUNG TRANSPLANT, SINGLE Right   . POLYPECTOMY  11/07/2017   Procedure: POLYPECTOMY;  Surgeon: Ronnette Juniper, MD;  Location: [facility];  Service: Gastroenterology;;   Family History  Problem Relation Age of Onset  . Pancreatic cancer Brother   . Heart disease Father    Social History:  reports that he quit smoking about 17 years ago. His smoking use included cigarettes. He has a 6.00 pack-year smoking history. He has never used smokeless tobacco. He reports that he does not drink alcohol and does not use drugs.  ROS: As per HPI otherwise negative.  Physical Exam: Vitals:   04/24/20 2141 04/24/20 2225 04/25/20 0446 04/25/20 0736  BP: (!) 108/57 103/63 (!) 114/59 (!) 91/52  Pulse: 76 78 82 79  Resp: (!) 21 18 20 20   Temp: 99 F (37.2 C) 99 F (37.2 C) 99.5 F (  37.5 C) 98.9 F (37.2 C)  TempSrc: Oral Oral Oral   SpO2: 98% 100% 93% 99%  Weight:      Height:         General: Well developed,tired appearing male in NAD Head: Normocephalic, atraumatic, sclera non-icteric, mucus membranes are moist. Neck: JVD not elevated. Lungs: Clear bilaterally to auscultation without wheezes, rales, or rhonchi. Breathing is  unlabored. Occasional non-productive cough Heart: RRR with normal S1, S2. No murmurs, rubs, or gallops appreciated. Abdomen: Soft, non-tender, non-distended with normoactive bowel sounds.  Musculoskeletal:  Strength and tone appear normal for age. Lower extremities: No edema or ischemic changes, no open wounds. Neuro: Alert and oriented X 3. Moves all extremities spontaneously. Psych:  Responds to questions appropriately with a normal affect. Dialysis Access: LUE AVF + bruit  Allergies  Allergen Reactions  . Levofloxacin Other (See Comments)    LOSS OF CONSCIOUSNESS  . Nsaids Other (See Comments)    Patient instructed not to take NSAID's after his lung transplant   Prior to Admission medications   Medication Sig Start Date End Date Taking? Authorizing Provider  acetaminophen (TYLENOL) 500 MG tablet Take 500-1,000 mg by mouth every 6 (six) hours as needed for moderate pain or headache.    Yes [provider]  Alpha Lipoic Acid 200 MG CAPS Take 200-400 mg by mouth See admin instructions. On Monday, Wednesday, Friday (dialysis days): take 2 capsules (400 mg) by mouth with breakfast and 1 capsule (200 mg) with supper; on Sunday, Tuesday, Thursday, Saturday (non-dialysis days): take 1 capsule (200 mg) three times daily with meals   Yes [provider]  aspirin 81 MG tablet Take 81 mg by mouth daily with breakfast.    Yes [provider]  azaTHIOprine (IMURAN) 50 MG tablet Take 50 mg by mouth See admin instructions. Take one tablet (50 mg) by mouth on Monday, Wednesday, Friday after dialysis   Yes [provider]  calcium acetate (PHOSLO) 667 MG capsule Take 667 mg by mouth 3 (three) times daily before meals.    Yes [provider]  cycloSPORINE modified (GENGRAF) 25 MG capsule Take 75-100 mg by mouth See admin instructions. Take 3 capsule (75 mg) by mouth daily with breakfast and 4 capsules (100 mg) at bedtime   Yes [provider]   dorzolamidel-timolol (COSOPT PF) 22.3-6.8 MG/ML SOLN ophthalmic solution Place 1 drop into both eyes 2 (two) times daily.   Yes [provider]  ferrous sulfate 325 (65 FE) MG tablet Take 325 mg by mouth See admin instructions. On Monday, Wednesday, Friday (dialysis days): take 1 tablet (325 mg) by mouth daily with breakfast; on Sunday, Tuesday, Thursday, Saturday (non-dialysis days): take 1 tablet (325 mg) by mouth daily with lunch.   Yes [provider]  fluorometholone (FML) 0.1 % ophthalmic suspension Place 1 drop into the right eye 4 (four) times daily. 04/11/20  Yes [provider]  fluticasone (FLONASE) 50 MCG/ACT nasal spray Place 2 sprays into the nose daily. Patient taking differently: Place 2 sprays into the nose daily as needed for allergies.  03/05/13  Yes Wenda Low, MD  Immune Globulin 10% (PRIVIGEN) 20 GM/200ML SOLN Inject 0.5 g/kg into the vein every 3 (three) months.    Yes [provider]  insulin regular (NOVOLIN R) 100 units/mL injection Inject 5-9 Units into the skin See admin instructions. Inject 5 units subcutaneously with breakfast and 6 units with lunch and supper; PLUS adjustment per sliding scale: CBG [phone] add 1 unit,  261-360 add 2 units, 361-461 add 3 units   Yes [provider]  midodrine (PROAMATINE) 5 MG tablet Take 5-10 mg by mouth See admin instructions. On Monday, Wednesday, Friday(dialysis days): take 2 tablets (10 mg) by mouth before dialysis, take 1 tablet (5 mg) after dialysis as needed for SBP >90. On Sunday, Tuesday, Thursday, Saturday (non-dialysis days): take 1 tablet (5 mg) by mouth 3 times daily as needed for SBP >90. 11/18/19  Yes [provider]  Multiple Vitamin (MULTIVITAMIN WITH MINERALS) TABS tablet Take 1 tablet by mouth daily. Centrum for Men 50 plus: on Monday, Wednesday, Friday (dialysis days): take one tablet daily with breakfast; on Sunday, Tuesday, Thursday, Saturday: take one tablet daily  with supper   Yes [provider]  omeprazole (PRILOSEC) 40 MG capsule Take 40 mg by mouth 2 (two) times daily with a meal.  11/12/18  Yes [provider]  polyvinyl alcohol-povidone (REFRESH) 1.4-0.6 % ophthalmic solution Place 1 drop into both eyes at bedtime.    Yes [provider]  pravastatin (PRAVACHOL) 10 MG tablet Take 10 mg by mouth at bedtime. 03/16/20  Yes [provider]  predniSONE (DELTASONE) 5 MG tablet Take 5 mg by mouth daily with breakfast.   Yes [provider]  sulfamethoxazole-trimethoprim (BACTRIM,SEPTRA) 400-80 MG tablet Take 1 tablet by mouth See admin instructions. On Monday, Wednesday, Friday (dialysis days): take one tablet by mouth daily with supper 02/27/17  Yes [provider]  valGANciclovir (VALCYTE) 450 MG tablet Take 450 mg by mouth See admin instructions. Take one tablet (450 mg) by mouth on Monday and Friday after dialysis 03/26/15  Yes [provider]  vitamin B-6 (PYRIDOXINE) 25 MG tablet Take 25 mg by mouth daily with breakfast.    Yes [provider]  ACCU-CHEK FASTCLIX LANCETS [place] As directed up to 4 times daily 12/16/15   [provider]  pravastatin (PRAVACHOL) 20 MG tablet Take 20 mg by mouth at bedtime.  Patient not taking: Reported on 04/24/2020    [provider]  prednisoLONE acetate (PRED FORTE) 1 % ophthalmic suspension Place 1 drop into both eyes in the morning and at bedtime.  Patient not taking: Reported on 04/24/2020 08/01/19   [provider]   Current Facility-Administered Medications  Medication Dose Route Frequency Provider Last Rate Last Admin  . acetaminophen (TYLENOL) tablet 650 mg  650 mg Oral Once PRN Marcelyn Bruins, MD      . aspirin EC tablet 81 mg  81 mg Oral Daily Marcelyn Bruins, MD   81 mg at 04/25/20 1058  . [START ON 04/27/2020] azaTHIOprine (IMURAN) tablet 50 mg  50 mg Oral Q M,W,F-HD Marcelyn Bruins, MD      . calcium  acetate (PHOSLO) capsule 1,334 mg  1,334 mg Oral TID Gevena Mart, MD   1,334 mg at 04/25/20 1058  . ceFEPIme (MAXIPIME) 1 g in sodium chloride 0.9 % 100 mL IVPB  1 g Intravenous Q24H Marcelyn Bruins, MD      . Chlorhexidine Gluconate Cloth 2 % PADS 6 each  6 each Topical Daily Marcelyn Bruins, MD      . cycloSPORINE modified (NEORAL) capsule 100 mg  100 mg Oral QHS Marcelyn Bruins, MD   100 mg at 04/25/20 0008  . cycloSPORINE modified (NEORAL) capsule 75 mg  75 mg Oral Daily Marcelyn Bruins, MD   75 mg at 04/25/20 1059  . dorzolamide-timolol (COSOPT) 22.3-6.8 MG/ML ophthalmic solution  1 drop  1 drop Both Eyes BID Marcelyn Bruins, MD   1 drop at 04/25/20 1103  . fluorometholone (FML) 0.1 % ophthalmic suspension 1 drop  1 drop Right Eye QID Marcelyn Bruins, MD   1 drop at 04/25/20 1102  . guaiFENesin-dextromethorphan (ROBITUSSIN DM) 100-10 MG/5ML syrup 5 mL  5 mL Oral Q4H PRN Arrien, Jimmy Picket, MD      . heparin injection 5,000 Units  5,000 Units Subcutaneous Q8H Marcelyn Bruins, MD   5,000 Units at 04/25/20 [phone]  . insulin aspart (novoLOG) injection 0-6 Units  0-6 Units Subcutaneous TID WC Marcelyn Bruins, MD      . metroNIDAZOLE (FLAGYL) tablet 500 mg  500 mg Oral Q8H Marcelyn Bruins, MD   500 mg at 04/25/20 0442  . [START ON 04/27/2020] midodrine (PROAMATINE) tablet 10 mg  10 mg Oral Once per day on Mon Wed Fri Melvin, Alexander B, MD      . midodrine (PROAMATINE) tablet 5 mg  5 mg Oral Daily PRN Marcelyn Bruins, MD      . pantoprazole (PROTONIX) EC tablet 40 mg  40 mg Oral Daily Marcelyn Bruins, MD   40 mg at 04/25/20 1057  . polyvinyl alcohol (LIQUIFILM TEARS) 1.4 % ophthalmic solution 1-2 drop  1-2 drop Both Eyes Daily PRN Marcelyn Bruins, MD      . pravastatin (PRAVACHOL) tablet 10 mg  10 mg Oral QHS Marcelyn Bruins, MD      . predniSONE (DELTASONE) tablet 5 mg  5 mg Oral Q breakfast Marcelyn Bruins, MD   5 mg at 04/25/20  1057  . sodium chloride (OCEAN) 0.65 % nasal spray 1 spray  1 spray Each Nare PRN Arrien, Jimmy Picket, MD      . Derrill Memo ON 04/27/2020] sulfamethoxazole-trimethoprim (BACTRIM) 400-80 MG per tablet 1 tablet  1 tablet Oral Q M,W,F-HD Marcelyn Bruins, MD      . Derrill Memo ON 04/27/2020] valGANciclovir (VALCYTE) 450 MG tablet TABS 450 mg  450 mg Oral Once per day on Mon Fri Melvin, Alexander B, MD      . Derrill Memo ON 04/27/2020] vancomycin (VANCOCIN) IVPB 750 mg/150 ml premix  750 mg Intravenous Q M,W,F-HD Marcelyn Bruins, MD       Labs: Basic Metabolic Panel: Recent Labs  Lab 04/24/20 1437 04/25/20 0401  NA 139 138  K 3.3* 3.3*  CL 93* 94*  CO2 27 28  GLUCOSE 136* 82  BUN 13 25*  CREATININE 4.70* 6.37*  CALCIUM 8.1* 7.9*   Liver Function Tests: Recent Labs  Lab 04/24/20 1437 04/25/20 0401  AST 72* 45*  ALT 36 26  ALKPHOS 452* 340*  BILITOT 1.8* 2.2*  PROT 6.4* 5.3*  ALBUMIN 3.0* 2.4*  CBC: Recent Labs  Lab 04/24/20 1437 04/25/20 0401  WBC 2.7* 2.4*  NEUTROABS 1.3*  --   HGB 13.2 11.1*  HCT 42.5 34.6*  MCV 110.7* 105.8*  PLT 128* 122*   CBG: Recent Labs  Lab 04/24/20 2030 04/24/20 2232 04/25/20 0952  GLUCAP 117* 101* 75   Studies/Results: CT Head Wo Contrast  Result Date: 04/24/2020 CLINICAL DATA:  Delirium. EXAM: CT HEAD WITHOUT CONTRAST TECHNIQUE: Contiguous axial images were obtained from the base of the skull through the vertex without intravenous contrast. COMPARISON:  Brain MRI 02/05/2019.  Head CT 02/05/2019. FINDINGS: Brain: The examination is significantly motion degraded at the level of the skull base, orbits and maxillofacial structures. Mild generalized cerebral atrophy. Mild  chronic small vessel ischemic disease was better appreciated on the prior MRI of 02/05/2019, including a tiny chronic lacunar infarct within the left caudate nucleus. There is no acute intracranial hemorrhage. No demarcated cortical infarct. No extra-axial fluid collection. No  evidence of intracranial mass. No midline shift. Vascular: No hyperdense vessel.  Atherosclerotic calcifications. Skull: Normal. Negative for fracture or focal lesion. Sinuses/Orbits: No acute orbital abnormality is identified. Partial opacification of the bilateral ethmoid air cells and right sphenoid sinus. IMPRESSION: The examination is significantly motion degraded at the level of the skull base, orbits and maxillofacial structures. No evidence of acute intracranial abnormality. Mild cerebral atrophy and chronic small vessel ischemic disease. Ethmoid and right sphenoid sinusitis. Electronically Signed   By: Kellie Simmering DO   On: 04/24/2020 18:14   DG Chest Portable 1 View  Result Date: 04/24/2020 CLINICAL DATA:  69 year old male with sudden onset lethargy. Dialysis earlier today. Fever and cough for almost 1 week. EXAM: PORTABLE CHEST 1 VIEW COMPARISON:  Chest CT 01/08/2020 and earlier. FINDINGS: Portable AP semi upright view at 1514 hours. Chronic severe left lung disease with architectural distortion and volume loss does not appear significantly changed since August. Stable left brachiocephalic and subclavian venous vascular stents. Anterior right rib cerclage wire and surgical clips about the right hilum are stable. The right lung appears stable and clear aside from chronic pleural thickening at the base. No pneumothorax or acute pulmonary opacity. Negative visible bowel gas pattern. No acute osseous abnormality identified. IMPRESSION: Stable chronic left lung disease. No superimposed acute cardiopulmonary abnormality identified. Electronically Signed   By: Genevie Ann M.D.   On: 04/24/2020 15:46   US Abdomen Limited RUQ (LIVER/GB)  Result Date: 04/24/2020 CLINICAL DATA:  Sepsis EXAM: ULTRASOUND ABDOMEN LIMITED RIGHT UPPER QUADRANT COMPARISON:  CT 01/08/2020, ultrasound 01/02/2020 FINDINGS: Gallbladder: Gallstone seen on comparison imaging are not well visualized on this sonographic assessment. Small  amount of biliary sludge is seen however. No pericholecystic fluid . No gallbladder wall thickening. Sonographic Percell Miller sign is reportedly negative. Common bile duct: Diameter: 3.9 mm, nondilated Liver: Diffusely coarsened and nodular liver surface contour, similar to comparison imaging. No focal liver lesion. No intrahepatic biliary dilatation. Portal vein is patent on color Doppler imaging with normal direction of blood flow towards the liver. Other: Persistent coughing resulted in extensive motion artifact. IMPRESSION: 1. Cholelithiasis seen on comparison imaging is not well visualized on this sonographic assessment. There is a small amount of biliary sludge. No sonographic evidence of acute cholecystitis. 2. Cirrhotic liver morphology. Electronically Signed   By: Lovena Le M.D.   On: 04/24/2020 22:53    Dialysis Orders: Center: [facility] on MWF. 160NRe, 4 hours, BFR 400,DFR 800, EDW 65.5kg, 2K, 2Ca, UF Profile 2, AVF 15g, heparin 1800 unit bolus venofer 100mg  q HD ordered but not yet started Hectorol 2 mcg IVP q HD  Assessment/Plan: 1.  Sepsis: On multiple immunosuppressant medications due to lung transplant. Febrile on presentation. On empiric antibiotics. Work up per primary team.  2.  ESRD:  MWF, no urgent indications for dialysis today. Next HD will be tomorrow due to holiday schedule this week.  3.  Hypertension/volume: BP chronically soft with HD, worse now likely 2/2 sepsis. Resume midodrine 10mg  pre-HD. Received 1L IVF in the ED, does not appear acutely volume overloaded.  4.  Anemia: Hgb at goal. IV iron ordered outpatient, will hold for now due to infection work up. No ESA indicated.  5.  Metabolic bone disease: Corrected  calcium at goal, will check phos with next labs. Continue hectorol and binders.  6.  Nutrition:  Alb quite low chronically, on protein supplements.   Anice Paganini, PA-C 04/25/2020, 12:02 PM  [facility] Pager: [phone]

## 2020-04-26 DIAGNOSIS — K7460 Unspecified cirrhosis of liver: Secondary | ICD-10-CM | POA: Diagnosis not present

## 2020-04-26 DIAGNOSIS — J849 Interstitial pulmonary disease, unspecified: Secondary | ICD-10-CM

## 2020-04-26 DIAGNOSIS — E1165 Type 2 diabetes mellitus with hyperglycemia: Secondary | ICD-10-CM

## 2020-04-26 DIAGNOSIS — R197 Diarrhea, unspecified: Secondary | ICD-10-CM | POA: Diagnosis not present

## 2020-04-26 DIAGNOSIS — J9611 Chronic respiratory failure with hypoxia: Secondary | ICD-10-CM | POA: Diagnosis not present

## 2020-04-26 DIAGNOSIS — J211 Acute bronchiolitis due to human metapneumovirus: Secondary | ICD-10-CM | POA: Diagnosis not present

## 2020-04-26 DIAGNOSIS — Z794 Long term (current) use of insulin: Secondary | ICD-10-CM

## 2020-04-26 LAB — BASIC METABOLIC PANEL
Anion gap: 19 — ABNORMAL HIGH (ref 5–15)
BUN: 42 mg/dL — ABNORMAL HIGH (ref 8–23)
CO2: 24 mmol/L (ref 22–32)
Calcium: 8.6 mg/dL — ABNORMAL LOW (ref 8.9–10.3)
Chloride: 93 mmol/L — ABNORMAL LOW (ref 98–111)
Creatinine, Ser: 9.32 mg/dL — ABNORMAL HIGH (ref 0.61–1.24)
GFR, Estimated: 6 mL/min — ABNORMAL LOW (ref >60)
Glucose, Bld: 158 mg/dL — ABNORMAL HIGH (ref 70–99)
Potassium: 4.2 mmol/L (ref 3.5–5.1)
Sodium: 136 mmol/L (ref 135–145)

## 2020-04-26 LAB — CBC WITH DIFFERENTIAL/PLATELET
Abs Immature Granulocytes: 0.05 10*3/uL (ref 0.00–0.07)
Basophils Absolute: 0 10*3/uL (ref 0.0–0.1)
Basophils Relative: 1 %
Eosinophils Absolute: 0.1 10*3/uL (ref 0.0–0.5)
Eosinophils Relative: 4 %
HCT: 39.6 % (ref 39.0–52.0)
Hemoglobin: 12.8 g/dL — ABNORMAL LOW (ref 13.0–17.0)
Immature Granulocytes: 2 %
Lymphocytes Relative: 18 %
Lymphs Abs: 0.5 10*3/uL — ABNORMAL LOW (ref 0.7–4.0)
MCH: 34 pg (ref 26.0–34.0)
MCHC: 32.3 g/dL (ref 30.0–36.0)
MCV: 105 fL — ABNORMAL HIGH (ref 80.0–100.0)
Monocytes Absolute: 0.6 10*3/uL (ref 0.1–1.0)
Monocytes Relative: 21 %
Neutro Abs: 1.5 10*3/uL — ABNORMAL LOW (ref 1.7–7.7)
Neutrophils Relative %: 54 %
Platelets: 143 10*3/uL — ABNORMAL LOW (ref 150–400)
RBC: 3.77 MIL/uL — ABNORMAL LOW (ref 4.22–5.81)
RDW: 14.4 % (ref 11.5–15.5)
WBC: 2.8 10*3/uL — ABNORMAL LOW (ref 4.0–10.5)
nRBC: 0 % (ref 0.0–0.2)

## 2020-04-26 LAB — GLUCOSE, CAPILLARY
Glucose-Capillary: 132 mg/dL — ABNORMAL HIGH (ref 70–99)
Glucose-Capillary: 131 mg/dL — ABNORMAL HIGH (ref 70–99)
Glucose-Capillary: 135 mg/dL — ABNORMAL HIGH (ref 70–99)
Glucose-Capillary: 145 mg/dL — ABNORMAL HIGH (ref 70–99)

## 2020-04-26 LAB — C DIFFICILE QUICK SCREEN W PCR REFLEX
C Diff antigen: POSITIVE — AB
C Diff interpretation: DETECTED
C Diff toxin: POSITIVE — AB

## 2020-04-26 MED ORDER — MIDODRINE HCL 5 MG PO TABS
10.0000 mg | ORAL_TABLET | Freq: Two times a day (BID) | ORAL | Status: DC
  Administered 2020-04-26 – 2020-04-27 (×2): 10 mg via ORAL
  Filled 2020-04-26: qty 2

## 2020-04-26 MED ORDER — VANCOMYCIN 50 MG/ML ORAL SOLUTION
125.0000 mg | Freq: Four times a day (QID) | ORAL | Status: DC
  Administered 2020-04-26 – 2020-04-27 (×5): 125 mg via ORAL
  Filled 2020-04-26 (×9): qty 2.5

## 2020-04-26 MED ORDER — MIDODRINE HCL 5 MG PO TABS
ORAL_TABLET | ORAL | Status: AC
  Filled 2020-04-26: qty 2

## 2020-04-26 NOTE — Progress Notes (Addendum)
[facility] Progress Note  Subjective: seen in room, no SOB , +cough, some fevers. Cdif + now and MPV + on resp panel. Fevers down from admit. Alert and up in the room.   Vitals:   04/25/20 0736 04/25/20 1204 04/25/20 1541 04/25/20 2301  BP: (!) 91/52 (!) 75/44 (!) 95/57 (!) 96/48  Pulse: 79 78 73 66  Resp: 20 20 11 18   Temp: 98.9 F (37.2 C) 98.8 F (37.1 C) 98.1 F (36.7 C) 97.8 F (36.6 C)  TempSrc:   Oral Oral  SpO2: 99% 100% 100% 100%  Weight:      Height:        Exam:   alert, nad   no jvd  Chest occ crackles R base, L clear  Cor reg no RG  Abd soft ntnd no ascites   Ext no LE edema   Alert, NF, ox3   LUA AVF+bruit    Home meds-    - asa 81/ pravachol  - imuran 50 mwf/ Cya 75 am+ 100 hs/ pred 5 am/ bactrim p hd mwf/ valcyte mon + fri p hd  - midodrine 10 pre hd mwf/ 5 tid non hd days  - prilosec qd/ phoslo 2 ac tid  - IVIG q 3 mos   - prn's/ vitamins/ supplements    OP HD: [facility] MWF   4h  65.5kg  2/2 bath  P2  Hep 1800   LUE AVF   - venofer 100 qhd ordered not started yet  - hect 2 ug tiw   Assessment/ Plan: 1. Bronchitis/ metapneumovirus - cough, fevers, CXR neg, COVID neg. Fevers resolving. Per pmd. 2. 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 On contact precautions now. Per pmd 3. 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  4. ESRD - on HD MWF. HD today on holiday schedule.  5. Hypotension - chronic issue on midodrine 10 bid nonhd days and 10pre HD on mwf. Will simplify to 10 bid daily while here. Vol okay, no ^vol on exam, up 1-2kg.   6. Cirrhosis of the liver -  Stable with no signs of acute decompensation, continue close monitoring.  7. 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  8. Anemia ckd - Hb > 10, no esa needed. No IV fe for now w/ infections  9. T2DM - SSI per pmd       [name] 04/26/2020, 11:38 AM   Recent Labs  Lab 04/25/20 0401 04/26/20 0141  K 3.3* 4.2  BUN 25* 42*   CREATININE 6.37* 9.32*  CALCIUM 7.9* 8.6*  HGB 11.1* 12.8*   Inpatient medications: . aspirin EC  81 mg Oral Daily  . [START ON 04/27/2020] azaTHIOprine  50 mg Oral Q M,W,F-HD  . calcium acetate  1,334 mg Oral TID AC  . Chlorhexidine Gluconate Cloth  6 each Topical Q0600  . cycloSPORINE modified  100 mg Oral QHS  . cycloSPORINE modified  75 mg Oral Daily  . dorzolamide-timolol  1 drop Both Eyes BID  . fluorometholone  1 drop Right Eye QID  . Gerhardt's butt cream   Topical Daily  . heparin  5,000 Units Subcutaneous Q8H  . insulin aspart  0-6 Units Subcutaneous TID WC  . [START ON 04/27/2020] midodrine  10 mg Oral Once per day on Mon Wed Fri  . pantoprazole  40 mg Oral Daily  . pravastatin  10 mg Oral QHS  . predniSONE  5 mg Oral Q breakfast  . [  START ON 04/27/2020] sulfamethoxazole-trimethoprim  1 tablet Oral Q M,W,F-HD  . [START ON 04/27/2020] valGANciclovir  450 mg Oral Once per day on Mon Fri  . vancomycin  125 mg Oral QID    acetaminophen, guaiFENesin-dextromethorphan, ipratropium-albuterol, midodrine, polyvinyl alcohol, sodium chloride

## 2020-04-26 NOTE — Progress Notes (Signed)
RT note. PT refusing CPAP at this time, RT will continue to monitor.

## 2020-04-26 NOTE — Progress Notes (Signed)
PROGRESS NOTE    [name]  QIH:[id number] DOB: January 14, 1951 DOA: 04/24/2020 PCP: Wenda Low, MD    Brief Narrative:  [name] was admitted to the hospital with a working diagnosis of systemic inflammatory response syndrome, due to metapneumovirus bronchitis (present on admission) rule out for bacterial sepsis.   New diagnoses c diff.  69 year old male with past medical history for end-stage renal disease on hemodialysis, chronic hypoxic respiratory failure, type 2 diabetes mellitus, hypertension, GERD, cirrhosis, ILD status post lung transplant and obstructive sleep apnea, who presented with lethargy and confusion.  Reported 1 week of dry cough and lethargy, his symptoms worsened after hemodialysis treatment on the day of admission.  On his initial physical examination patient was febrile 102 F, heart rate 90-100, respirate 20, blood pressure systolic in the 87F.  He was somnolent, his lungs had rales at bases bilaterally, heart S1-S2, present, rhythmic, soft abdomen, no lower extremity edema, left upper extremity fistula with good thrill. Sodium 139, potassium 3.3, chloride 93, bicarb 27, glucose 136, BUN 13, creatinine 4.7, alkaline phosphatase 452, AST 72, ALT 36, total bilirubin is 1.8, lactic acid 2.6, white count 2.7, hemoglobin 13.2, hematocrit 42.5, platelets 128.  His respiratory panel positive for metapneumovirus.  SARS COVID-19 negative. Chest radiograph with no infiltrates on the right lung, left lung with increased interstitial markings, mediastinum shifted to the left, chronic  Stool tested positive for C diff, patient started on oral vancomycin.   Assessment & Plan:   Principal Problem:   Acute bronchiolitis due to human metapneumovirus Active Problems:   GERD without esophagitis   Chronic respiratory failure (HCC)   ILD (interstitial lung disease) ([place])   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[place])   Immunocompromised state ([place])   Mixed diabetic hyperlipidemia associated  with type 2 diabetes mellitus ([place])   Hepatic cirrhosis ([place])   Uncontrolled type 2 diabetes mellitus with hyperglycemia, with long-term current use of insulin (HCC)   Acute diarrhea    1. Bronchitis due to metapneumovirus. Patient has ruled out for bacterial sepsis.  Continue with bronchodilator therapy, antitussive agents and airway clearing techniques with flutter valve and incentive spirometer.  Dyspnea has been improving.   2. New diagnosis of C diff diarrhea (present on admission). Patient with persistent diarrhea, no abdominal pain, her stool tested positive for C diff toxin and antigen.   Start on oral vancomycin per protocol.   2. ILD sp lung transplant (at [facility]). On immunosuppressive regimen with prednisone, azathioprine and cyclosporin. Supplemental oxygen support.   On prophylactic valganciclovir and bactrim.   3. Cirrhosis of the liver. Compensated liver disease, continue close monitoring.   4. ESRD on HD/ hypotension. Midodrine to prevent worsening hypotension, continue to follow up with nephrology for recommendations for inpatient renal replacement therapy.    On phosphate binders for metabolic bone disease.   4. T2DM/ dyslpidemia, Patient is tolerating po well, no nausea or vomiting, his fasting glucose this am is 158 mg/dl Continue insulin sliding scale for glucose cover and monitoring.   On  pravastatin.   5. OSA. On CPAP.    6. Pancytopenia. Likely chronic and multifactorial, wbc is 2,8 with hgb at 12,8 and Hct 39.6 and platelet at 143. Continue close follow up on cell count.    Patient continue to be at high risk for worsening c diff diarrhea.   Status is: Inpatient  Remains inpatient appropriate because:Inpatient level of care appropriate due to severity of illness   Dispo: The patient is from: Home  Anticipated d/c is to: Home              Anticipated d/c date is: 2 days              Patient currently is not medically stable  to d/c.   DVT prophylaxis: Heparin   Code Status:   full  Family Communication:  No family at the bedside     Consultants:   Nephrology    Subjective: Patient continue to have diarrhea, no nausea or vomiting, no abdominal pain, continue to feel very weak and deconditioned.   Objective: Vitals:   04/25/20 0736 04/25/20 1204 04/25/20 1541 04/25/20 2301  BP: (!) 91/52 (!) 75/44 (!) 95/57 (!) 96/48  Pulse: 79 78 73 66  Resp: 20 20 11 18   Temp: 98.9 F (37.2 C) 98.8 F (37.1 C) 98.1 F (36.7 C) 97.8 F (36.6 C)  TempSrc:   Oral Oral  SpO2: 99% 100% 100% 100%  Weight:      Height:       No intake or output data in the 24 hours ending 04/26/20 1140 Filed Weights   04/24/20 1500  Weight: 67 kg    Examination:   General: Not in pain or dyspnea, deconditioned  Neurology: Awake and alert, non focal  E ENT: mild pallor, no icterus, oral mucosa moist Cardiovascular: No JVD. S1-S2 present, rhythmic, no gallops, rubs, or murmurs. No lower extremity edema. Pulmonary: positive breath sounds bilaterally, with no wheezing, rhonchi or rales. Gastrointestinal. Abdomen soft and non tender Skin. No rashes Musculoskeletal: no joint deformities     Data Reviewed: I have personally reviewed following labs and imaging studies  CBC: Recent Labs  Lab 04/24/20 1437 04/25/20 0401 04/26/20 0141  WBC 2.7* 2.4* 2.8*  NEUTROABS 1.3*  --  1.5*  HGB 13.2 11.1* 12.8*  HCT 42.5 34.6* 39.6  MCV 110.7* 105.8* 105.0*  PLT 128* 122* 726*   Basic Metabolic Panel: Recent Labs  Lab 04/24/20 1437 04/25/20 0401 04/26/20 0141  NA 139 138 136  K 3.3* 3.3* 4.2  CL 93* 94* 93*  CO2 27 28 24   GLUCOSE 136* 82 158*  BUN 13 25* 42*  CREATININE 4.70* 6.37* 9.32*  CALCIUM 8.1* 7.9* 8.6*   GFR: Estimated Creatinine Clearance: 5.9 mL/min (A) (by C-G formula based on SCr of 9.32 mg/dL (H)). Liver Function Tests: Recent Labs  Lab 04/24/20 1437 04/25/20 0401  AST 72* 45*  ALT 36 26   ALKPHOS 452* 340*  BILITOT 1.8* 2.2*  PROT 6.4* 5.3*  ALBUMIN 3.0* 2.4*   No results for input(s): LIPASE, AMYLASE in the last 168 hours. No results for input(s): AMMONIA in the last 168 hours. Coagulation Profile: Recent Labs  Lab 04/24/20 1533 04/25/20 0401  INR 1.0 1.1   Cardiac Enzymes: No results for input(s): CKTOTAL, CKMB, CKMBINDEX, TROPONINI in the last 168 hours. BNP (last 3 results) No results for input(s): PROBNP in the last 8760 hours. HbA1C: No results for input(s): HGBA1C in the last 72 hours. CBG: Recent Labs  Lab 04/25/20 0952 04/25/20 1205 04/25/20 1538 04/25/20 2050 04/26/20 0752  GLUCAP 75 156* 161* 142* 132*   Lipid Profile: No results for input(s): CHOL, HDL, LDLCALC, TRIG, CHOLHDL, LDLDIRECT in the last 72 hours. Thyroid Function Tests: No results for input(s): TSH, T4TOTAL, FREET4, T3FREE, THYROIDAB in the last 72 hours. Anemia Panel: No results for input(s): VITAMINB12, FOLATE, FERRITIN, TIBC, IRON, RETICCTPCT in the last 72 hours.    Radiology Studies: I have reviewed  all of the imaging during this hospital visit personally     Scheduled Meds: . aspirin EC  81 mg Oral Daily  . [START ON 04/27/2020] azaTHIOprine  50 mg Oral Q M,W,F-HD  . calcium acetate  1,334 mg Oral TID AC  . Chlorhexidine Gluconate Cloth  6 each Topical Q0600  . cycloSPORINE modified  100 mg Oral QHS  . cycloSPORINE modified  75 mg Oral Daily  . dorzolamide-timolol  1 drop Both Eyes BID  . fluorometholone  1 drop Right Eye QID  . Gerhardt's butt cream   Topical Daily  . heparin  5,000 Units Subcutaneous Q8H  . insulin aspart  0-6 Units Subcutaneous TID WC  . [START ON 04/27/2020] midodrine  10 mg Oral Once per day on Mon Wed Fri  . pantoprazole  40 mg Oral Daily  . pravastatin  10 mg Oral QHS  . predniSONE  5 mg Oral Q breakfast  . [START ON 04/27/2020] sulfamethoxazole-trimethoprim  1 tablet Oral Q M,W,F-HD  . [START ON 04/27/2020] valGANciclovir  450 mg  Oral Once per day on Mon Fri  . vancomycin  125 mg Oral QID   Continuous Infusions:   LOS: 2 days        Tywana Robotham Gerome Apley, MD

## 2020-04-26 NOTE — Plan of Care (Signed)
  Problem: Clinical Measurements: Goal: Respiratory complications will improve Outcome: Progressing   Problem: Clinical Measurements: Goal: Cardiovascular complication will be avoided Outcome: Progressing   Problem: Activity: Goal: Risk for activity intolerance will decrease Outcome: Progressing   Problem: Nutrition: Goal: Adequate nutrition will be maintained Outcome: Progressing   

## 2020-04-27 ENCOUNTER — Other Ambulatory Visit (HOSPITAL_COMMUNITY): Payer: Self-pay | Admitting: Internal Medicine

## 2020-04-27 DIAGNOSIS — I5032 Chronic diastolic (congestive) heart failure: Secondary | ICD-10-CM | POA: Diagnosis not present

## 2020-04-27 DIAGNOSIS — E1122 Type 2 diabetes mellitus with diabetic chronic kidney disease: Secondary | ICD-10-CM | POA: Diagnosis not present

## 2020-04-27 DIAGNOSIS — I10 Essential (primary) hypertension: Secondary | ICD-10-CM | POA: Diagnosis not present

## 2020-04-27 DIAGNOSIS — H409 Unspecified glaucoma: Secondary | ICD-10-CM | POA: Diagnosis not present

## 2020-04-27 DIAGNOSIS — R197 Diarrhea, unspecified: Secondary | ICD-10-CM | POA: Diagnosis not present

## 2020-04-27 DIAGNOSIS — I270 Primary pulmonary hypertension: Secondary | ICD-10-CM | POA: Diagnosis not present

## 2020-04-27 DIAGNOSIS — K219 Gastro-esophageal reflux disease without esophagitis: Secondary | ICD-10-CM | POA: Diagnosis not present

## 2020-04-27 DIAGNOSIS — F329 Major depressive disorder, single episode, unspecified: Secondary | ICD-10-CM | POA: Diagnosis not present

## 2020-04-27 DIAGNOSIS — N186 End stage renal disease: Secondary | ICD-10-CM | POA: Diagnosis not present

## 2020-04-27 DIAGNOSIS — K7460 Unspecified cirrhosis of liver: Secondary | ICD-10-CM | POA: Diagnosis not present

## 2020-04-27 DIAGNOSIS — J9611 Chronic respiratory failure with hypoxia: Secondary | ICD-10-CM | POA: Diagnosis not present

## 2020-04-27 DIAGNOSIS — G4700 Insomnia, unspecified: Secondary | ICD-10-CM | POA: Diagnosis not present

## 2020-04-27 DIAGNOSIS — J211 Acute bronchiolitis due to human metapneumovirus: Secondary | ICD-10-CM | POA: Diagnosis not present

## 2020-04-27 DIAGNOSIS — E782 Mixed hyperlipidemia: Secondary | ICD-10-CM | POA: Diagnosis not present

## 2020-04-27 LAB — BASIC METABOLIC PANEL
Anion gap: 19 — ABNORMAL HIGH (ref 5–15)
BUN: 22 mg/dL (ref 8–23)
CO2: 26 mmol/L (ref 22–32)
Calcium: 9 mg/dL (ref 8.9–10.3)
Chloride: 96 mmol/L — ABNORMAL LOW (ref 98–111)
Creatinine, Ser: 5.97 mg/dL — ABNORMAL HIGH (ref 0.61–1.24)
GFR, Estimated: 9 mL/min — ABNORMAL LOW (ref >60)
Glucose, Bld: 86 mg/dL (ref 70–99)
Potassium: 3.4 mmol/L — ABNORMAL LOW (ref 3.5–5.1)
Sodium: 141 mmol/L (ref 135–145)

## 2020-04-27 LAB — GLUCOSE, CAPILLARY
Glucose-Capillary: 103 mg/dL — ABNORMAL HIGH (ref 70–99)
Glucose-Capillary: 147 mg/dL — ABNORMAL HIGH (ref 70–99)

## 2020-04-27 MED ORDER — VANCOMYCIN HCL 125 MG PO CAPS: 125.0000 mg | ORAL_CAPSULE | Freq: Four times a day (QID) | ORAL | 0 refills | Status: DC

## 2020-04-27 MED ORDER — GERHARDT'S BUTT CREAM: 1.0000 "application " | TOPICAL_CREAM | Freq: Every day | CUTANEOUS | 0 refills | Status: AC

## 2020-04-27 MED FILL — VANCOMYCIN HCL 125 MG CAP: 125 | 10 days supply | Qty: 40 | Fill #0

## 2020-04-27 NOTE — Discharge Summary (Signed)
Physician Discharge Summary  [name] NKN:[id number] DOB: June 06, 1951 DOA: 04/24/2020  PCP: Wenda Low, MD  Admit date: 04/24/2020 Discharge date: 04/27/2020  Admitted From: Home  Disposition:  Home   Recommendations for Outpatient Follow-up and new medication changes:  1. Follow up with Dr. Lysle Rubens in 7 days.  2. Continue oral Vancomycin for 14 days.  3. Continue with holiday scheduled hemodialysis.   Home Health: no   Equipment/Devices: no    Discharge Condition: stable  CODE STATUS: full  Diet recommendation: hearth healthy and diabetic prudent.   Brief/Interim Summary: Mr.Craton was admitted to the hospital with a working diagnosis of systemic inflammatory response syndrome,due to metapneumovirus bronchitis (present on admission). He ruled out for bacterial sepsis. New diagnoses c diff.  69 year old male with past medical history for end-stage renal disease on hemodialysis, chronic hypoxic respiratory failure, type 2 diabetes mellitus, hypertension, GERD, cirrhosis, ILD status post lung transplantandobstructive sleep apnea,who presented with lethargy and confusion.  Reported 1 week of dry cough and lethargy, his symptoms worsened after hemodialysis treatment on the day of admission. On his initial physical examination patient was febrile 102 F, heart rate 90-100, respirate 20, blood pressure systolic in the 37T. He was somnolent, his lungs had rales at bases bilaterally, heart S1-S2, present, rhythmic, soft abdomen, no lower extremity edema, left upper extremity fistula with good thrill. Sodium 139, potassium 3.3, chloride 93, bicarb 27, glucose 136, BUN 13, creatinine 4.7, alkaline phosphatase 452, AST 72, ALT 36, total bilirubin is 1.8, lactic acid 2.6, white count 2.7, hemoglobin 13.2, hematocrit 42.5, platelets 128.His respiratory panel positive for metapneumovirus. SARS COVID-19 negative. Chest radiograph with no infiltrates on the right lung, left  lung with increased interstitial markings, mediastinum shifted to the left, chronic changes  Stool tested positive for C diff, patient started on oral vancomycin.  1.  Acute bronchitis due to metapneumovirus (present on admission).  Patient received supportive medical care with bronchodilators, antitussive agents and airway clearing techniques, incentive spirometry and flutter valve. His dyspnea improved significantly during his hospitalization.  At discharge his oximetry was 100% on 3 L per nasal cannula.  2.  New diagnosis of C. difficile diarrhea, present on admission.  Stool tested positive for C. difficile toxin and antigen, patient with persistent diarrhea.  He was placed on oral vancomycin with improvement of his symptoms. Patient will continue vancomycin for the next 14 days.  3.  ILD status post lung transplant at [facility].  Patient continue immunosuppressive therapy with prednisone, azathioprine and cyclosporine. Supplemental oxygen per nasal cannula. Prophylactic antibiotics with valganciclovir and Bactrim.  4.  Cirrhosis of the liver.  No acute decompensation.  5.  End-stage renal disease on hemodialysis, hypotension.  Patient was placed on midodrine for blood pressure control, he underwent renal replacement therapy with no major complications. His last hemodialysis was 11/21 per holiday schedule.  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   At discharge his blood pressure is 024-097 mimHg systolic, he will continue midodrine on HD per home regimen.  6.  Controlled type 2 diabetes mellitus, dyslipidemia.  Patient received insulin sliding scale for glucose coverage and monitoring. His capillary glucose remained well controlled.  Continue pravastatin.  7.  Obstructive sleep apnea.  Continue home CPAP.  8.  Chronic and multifactorial pancytopenia.  His discharge white cell count 2.8, hemoglobin 12.8, hematocrit 39.6 and platelets 143.  Follow-up as an  outpatient.  Discharge Diagnoses:  Principal Problem:   Acute bronchiolitis due to human metapneumovirus Active Problems:  GERD without esophagitis   Chronic respiratory failure (HCC)   ILD (interstitial lung disease) ([place])   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[place])   Immunocompromised state ([place])   Mixed diabetic hyperlipidemia associated with type 2 diabetes mellitus ([place])   Hepatic cirrhosis ([place])   Uncontrolled type 2 diabetes mellitus with hyperglycemia, with long-term current use of insulin ([place])   Acute diarrhea    Discharge Instructions   Allergies as of 04/27/2020      Reactions   Levofloxacin Other (See Comments)   LOSS OF CONSCIOUSNESS   Nsaids Other (See Comments)   Patient instructed not to take NSAID's after his lung transplant      Medication List    STOP taking these medications   prednisoLONE acetate 1 % ophthalmic suspension Commonly known as: PRED FORTE     TAKE these medications   Accu-Chek FastClix Lancets Misc As directed up to 4 times daily   acetaminophen 500 MG tablet Commonly known as: TYLENOL Take 500-1,000 mg by mouth every 6 (six) hours as needed for moderate pain or headache.   Alpha Lipoic Acid 200 MG Caps Take 200-400 mg by mouth See admin instructions. On Monday, Wednesday, Friday (dialysis days): take 2 capsules (400 mg) by mouth with breakfast and 1 capsule (200 mg) with supper; on Sunday, Tuesday, Thursday, Saturday (non-dialysis days): take 1 capsule (200 mg) three times daily with meals   aspirin 81 MG tablet Take 81 mg by mouth daily with breakfast.   azaTHIOprine 50 MG tablet Commonly known as: IMURAN Take 50 mg by mouth See admin instructions. Take one tablet (50 mg) by mouth on Monday, Wednesday, Friday after dialysis   calcium acetate 667 MG capsule Commonly known as: PHOSLO Take 667 mg by mouth 3 (three) times daily before meals.   Cosopt PF 22.3-6.8 MG/ML Soln ophthalmic solution Generic drug: dorzolamidel-timolol Place 1 drop  into both eyes 2 (two) times daily.   ferrous sulfate 325 (65 FE) MG tablet Take 325 mg by mouth See admin instructions. On Monday, Wednesday, Friday (dialysis days): take 1 tablet (325 mg) by mouth daily with breakfast; on Sunday, Tuesday, Thursday, Saturday (non-dialysis days): take 1 tablet (325 mg) by mouth daily with lunch.   fluorometholone 0.1 % ophthalmic suspension Commonly known as: FML Place 1 drop into the right eye 4 (four) times daily.   fluticasone 50 MCG/ACT nasal spray Commonly known as: FLONASE Place 2 sprays into the nose daily. What changed:   when to take this  reasons to take this   Gengraf 25 MG capsule Generic drug: cycloSPORINE modified Take 75-100 mg by mouth See admin instructions. Take 3 capsule (75 mg) by mouth daily with breakfast and 4 capsules (100 mg) at bedtime   Gerhardt's butt cream Crea Apply 1 application topically daily for 15 days. Start taking on: April 28, 2020   Immune Globulin 10% 20 GM/200ML Soln Commonly known as: PRIVIGEN Inject 0.5 g/kg into the vein every 3 (three) months.   midodrine 5 MG tablet Commonly known as: PROAMATINE Take 5-10 mg by mouth See admin instructions. On Monday, Wednesday, Friday(dialysis days): take 2 tablets (10 mg) by mouth before dialysis, take 1 tablet (5 mg) after dialysis as needed for SBP >90. On Sunday, Tuesday, Thursday, Saturday (non-dialysis days): take 1 tablet (5 mg) by mouth 3 times daily as needed for SBP >90.   multivitamin with minerals Tabs tablet Take 1 tablet by mouth daily. Centrum for Men 50 plus: on Monday, Wednesday, Friday (dialysis days): take one tablet  daily with breakfast; on Sunday, Tuesday, Thursday, Saturday: take one tablet daily with supper   NovoLIN R 100 units/mL injection Generic drug: insulin regular Inject 5-9 Units into the skin See admin instructions. Inject 5 units subcutaneously with breakfast and 6 units with lunch and supper; PLUS adjustment per sliding scale:  CBG [phone] add 1 unit, 261-360 add 2 units, 361-461 add 3 units   omeprazole 40 MG capsule Commonly known as: PRILOSEC Take 40 mg by mouth 2 (two) times daily with a meal.   pravastatin 10 MG tablet Commonly known as: PRAVACHOL Take 10 mg by mouth at bedtime. What changed: Another medication with the same name was removed. Continue taking this medication, and follow the directions you see here.   predniSONE 5 MG tablet Commonly known as: DELTASONE Take 5 mg by mouth daily with breakfast.   Refresh 1.4-0.6 % ophthalmic solution Generic drug: polyvinyl alcohol-povidone Place 1 drop into both eyes at bedtime.   sulfamethoxazole-trimethoprim 400-80 MG tablet Commonly known as: BACTRIM Take 1 tablet by mouth See admin instructions. On Monday, Wednesday, Friday (dialysis days): take one tablet by mouth daily with supper   valGANciclovir 450 MG tablet Commonly known as: VALCYTE Take 450 mg by mouth See admin instructions. Take one tablet (450 mg) by mouth on Monday and Friday after dialysis   vancomycin 125 MG capsule Commonly known as: VANCOCIN Take 1 capsule (125 mg total) by mouth 4 (four) times daily for 14 days. Through 05/05/2020   vitamin B-6 25 MG tablet Commonly known as: pyridOXINE Take 25 mg by mouth daily with breakfast.       Follow-up Information    Wenda Low, MD Follow up.   Specialty: Internal Medicine Contact information: [address]  [place] [address] [phone]              Allergies  Allergen Reactions  . Levofloxacin Other (See Comments)    LOSS OF CONSCIOUSNESS  . Nsaids Other (See Comments)    Patient instructed not to take NSAID's after his lung transplant    Consultations:  Nephrology    Procedures/Studies: CT Head Wo Contrast  Result Date: 04/24/2020 CLINICAL DATA:  Delirium. EXAM: CT HEAD WITHOUT CONTRAST TECHNIQUE: Contiguous axial images were obtained from the base of the skull through the vertex  without intravenous contrast. COMPARISON:  Brain MRI 02/05/2019.  Head CT 02/05/2019. FINDINGS: Brain: The examination is significantly motion degraded at the level of the skull base, orbits and maxillofacial structures. Mild generalized cerebral atrophy. Mild chronic small vessel ischemic disease was better appreciated on the prior MRI of 02/05/2019, including a tiny chronic lacunar infarct within the left caudate nucleus. There is no acute intracranial hemorrhage. No demarcated cortical infarct. No extra-axial fluid collection. No evidence of intracranial mass. No midline shift. Vascular: No hyperdense vessel.  Atherosclerotic calcifications. Skull: Normal. Negative for fracture or focal lesion. Sinuses/Orbits: No acute orbital abnormality is identified. Partial opacification of the bilateral ethmoid air cells and right sphenoid sinus. IMPRESSION: The examination is significantly motion degraded at the level of the skull base, orbits and maxillofacial structures. No evidence of acute intracranial abnormality. Mild cerebral atrophy and chronic small vessel ischemic disease. Ethmoid and right sphenoid sinusitis. Electronically Signed   By: Kellie Simmering DO   On: 04/24/2020 18:14   DG Chest Portable 1 View  Result Date: 04/24/2020 CLINICAL DATA:  69 year old male with sudden onset lethargy. Dialysis earlier today. Fever and cough for almost 1 week. EXAM: PORTABLE CHEST 1 VIEW  COMPARISON:  Chest CT 01/08/2020 and earlier. FINDINGS: Portable AP semi upright view at 1514 hours. Chronic severe left lung disease with architectural distortion and volume loss does not appear significantly changed since August. Stable left brachiocephalic and subclavian venous vascular stents. Anterior right rib cerclage wire and surgical clips about the right hilum are stable. The right lung appears stable and clear aside from chronic pleural thickening at the base. No pneumothorax or acute pulmonary opacity. Negative visible bowel gas  pattern. No acute osseous abnormality identified. IMPRESSION: Stable chronic left lung disease. No superimposed acute cardiopulmonary abnormality identified. Electronically Signed   By: Genevie Ann M.D.   On: 04/24/2020 15:46   US Abdomen Limited RUQ (LIVER/GB)  Result Date: 04/24/2020 CLINICAL DATA:  Sepsis EXAM: ULTRASOUND ABDOMEN LIMITED RIGHT UPPER QUADRANT COMPARISON:  CT 01/08/2020, ultrasound 01/02/2020 FINDINGS: Gallbladder: Gallstone seen on comparison imaging are not well visualized on this sonographic assessment. Small amount of biliary sludge is seen however. No pericholecystic fluid . No gallbladder wall thickening. Sonographic Percell Miller sign is reportedly negative. Common bile duct: Diameter: 3.9 mm, nondilated Liver: Diffusely coarsened and nodular liver surface contour, similar to comparison imaging. No focal liver lesion. No intrahepatic biliary dilatation. Portal vein is patent on color Doppler imaging with normal direction of blood flow towards the liver. Other: Persistent coughing resulted in extensive motion artifact. IMPRESSION: 1. Cholelithiasis seen on comparison imaging is not well visualized on this sonographic assessment. There is a small amount of biliary sludge. No sonographic evidence of acute cholecystitis. 2. Cirrhotic liver morphology. Electronically Signed   By: Lovena Le M.D.   On: 04/24/2020 22:53        Subjective: Patient is feeling well, his diarrhea and dyspnea have improved, no nausea or vomiting, no chest pain or abdominal pain.   Discharge Exam: Vitals:   04/27/20 0432 04/27/20 0455  BP:    Pulse:    Resp:    Temp:    SpO2: 100% 100%   Vitals:   04/26/20 1815 04/26/20 1918 04/27/20 0432 04/27/20 0455  BP: 118/60 109/67    Pulse: 71 78    Resp: 18 16    Temp: 98.2 F (36.8 C) 98.2 F (36.8 C)    TempSrc: Oral Oral    SpO2: 99% 100% 100% 100%  Weight: 73 kg     Height:        General: Not in pain or dyspnea.  Neurology: Awake and alert, non  focal  E ENT: mild pallor, no icterus, oral mucosa moist Cardiovascular: No JVD. S1-S2 present, rhythmic, no gallops, rubs, or murmurs. No lower extremity edema. Pulmonary: positive breath sounds bilaterally, with no wheezing, rhonchi or rales. Gastrointestinal. Abdomen soft and non tender Skin. No rashes Musculoskeletal: no joint deformities   The results of significant diagnostics from this hospitalization (including imaging, microbiology, ancillary and laboratory) are listed below for reference.     Microbiology: Recent Results (from the past 240 hour(s))  Blood Culture (routine x 2)     Status: None (Preliminary result)   Collection Time: 04/24/20  3:53 PM   Specimen: BLOOD RIGHT HAND  Result Value Ref Range Status   Specimen Description BLOOD RIGHT HAND  Final   Special Requests   Final    BOTTLES DRAWN AEROBIC AND ANAEROBIC Blood Culture results may not be optimal due to an inadequate volume of blood received in culture bottles   Culture   Final    NO GROWTH 3 DAYS Performed at [facility], [address], [place], [place] [address]    Report Status PENDING  Incomplete  Blood Culture (routine x 2)     Status: None (Preliminary result)   Collection Time: 04/24/20  3:53 PM   Specimen: BLOOD  Result Value Ref Range Status   Specimen Description BLOOD RIGHT ANTECUBITAL  Final   Special Requests   Final    BOTTLES DRAWN AEROBIC AND ANAEROBIC Blood Culture adequate volume   Culture   Final    NO GROWTH 3 DAYS Performed at [facility], [address] [place], [place] [address]    Report Status PENDING  Incomplete  SARS Coronavirus 2 by RT PCR (hospital order, performed in [facility] hospital lab) Nasopharyngeal Nasopharyngeal Swab     Status: None   Collection Time: 04/24/20  3:55 PM   Specimen: Nasopharyngeal Swab  Result Value Ref Range Status   SARS Coronavirus 2 NEGATIVE NEGATIVE Final    Comment: (NOTE) SARS-CoV-2 target nucleic acids are NOT  DETECTED.  The SARS-CoV-2 RNA is generally detectable in upper and lower respiratory specimens during the acute phase of infection. The lowest concentration of SARS-CoV-2 viral copies this assay can detect is 250 copies / mL. A negative result does not preclude SARS-CoV-2 infection and should not be used as the sole basis for treatment or other patient management decisions.  A negative result may occur with improper specimen collection / handling, submission of specimen other than nasopharyngeal swab, presence of viral mutation(s) within the areas targeted by this assay, and inadequate number of viral copies (<250 copies / mL). A negative result must be combined with clinical observations, patient history, and epidemiological information.  Fact Sheet for Patients:   StrictlyIdeas.no  Fact Sheet for Healthcare Providers: BankingDealers.co.za  This test is not yet approved or  cleared by the [place] FDA and has been authorized for detection and/or diagnosis of SARS-CoV-2 by FDA under an Emergency Use Authorization (EUA).  This EUA will remain in effect (meaning this test can be used) for the duration of the COVID-19 declaration under Section 564(b)(1) of the Act, 21 U.S.C. section 360bbb-3(b)(1), unless the authorization is terminated or revoked sooner.  Performed at [facility], [place] [address] [place], [place] [address]   Respiratory Panel by PCR     Status: Abnormal   Collection Time: 04/24/20  6:57 PM   Specimen: Nasopharyngeal Swab; Respiratory  Result Value Ref Range Status   Adenovirus NOT DETECTED NOT DETECTED Final   Coronavirus 229E NOT DETECTED NOT DETECTED Final    Comment: (NOTE) The Coronavirus on the Respiratory Panel, DOES NOT test for the novel  Coronavirus (2019 nCoV)    Coronavirus HKU1 NOT DETECTED NOT DETECTED Final   Coronavirus NL63 NOT DETECTED NOT DETECTED Final   Coronavirus OC43 NOT DETECTED  NOT DETECTED Final   Metapneumovirus DETECTED (A) NOT DETECTED Final   Rhinovirus / Enterovirus NOT DETECTED NOT DETECTED Final   Influenza A NOT DETECTED NOT DETECTED Final   Influenza B NOT DETECTED NOT DETECTED Final   Parainfluenza Virus 1 NOT DETECTED NOT DETECTED Final   Parainfluenza Virus 2 NOT DETECTED NOT DETECTED Final   Parainfluenza Virus 3 NOT DETECTED NOT DETECTED Final   Parainfluenza Virus 4 NOT DETECTED NOT DETECTED Final   Respiratory Syncytial Virus NOT DETECTED NOT DETECTED Final   Bordetella pertussis NOT DETECTED NOT DETECTED Final   Chlamydophila pneumoniae NOT DETECTED NOT DETECTED Final   Mycoplasma pneumoniae NOT DETECTED NOT DETECTED Final  Comment: Performed at [facility], [place] [address] [place], [place] [address]  MRSA PCR Screening     Status: None   Collection Time: 04/24/20 11:11 PM   Specimen: Nasopharyngeal  Result Value Ref Range Status   MRSA by PCR NEGATIVE NEGATIVE Final    Comment:        The GeneXpert MRSA Assay (FDA approved for NASAL specimens only), is one component of a comprehensive MRSA colonization surveillance program. It is not intended to diagnose MRSA infection nor to guide or monitor treatment for MRSA infections. Performed at [facility], [place] [address] [place], [place] [address] [address]   Gastrointestinal Panel by PCR , Stool     Status: None   Collection Time: 04/25/20  4:43 AM   Specimen: STOOL  Result Value Ref Range Status   Campylobacter species NOT DETECTED NOT DETECTED Final   Plesimonas shigelloides NOT DETECTED NOT DETECTED Final   Salmonella species NOT DETECTED NOT DETECTED Final   Yersinia enterocolitica NOT DETECTED NOT DETECTED Final   Vibrio species NOT DETECTED NOT DETECTED Final   Vibrio cholerae NOT DETECTED NOT DETECTED Final   Enteroaggregative E coli (EAEC) NOT DETECTED NOT DETECTED Final   Enteropathogenic E coli (EPEC) NOT DETECTED NOT DETECTED Final   Enterotoxigenic E coli (ETEC) NOT  DETECTED NOT DETECTED Final   Shiga like toxin producing E coli (STEC) NOT DETECTED NOT DETECTED Final   Shigella/Enteroinvasive E coli (EIEC) NOT DETECTED NOT DETECTED Final   Cryptosporidium NOT DETECTED NOT DETECTED Final   Cyclospora cayetanensis NOT DETECTED NOT DETECTED Final   Entamoeba histolytica NOT DETECTED NOT DETECTED Final   Giardia lamblia NOT DETECTED NOT DETECTED Final   Adenovirus F40/41 NOT DETECTED NOT DETECTED Final   Astrovirus NOT DETECTED NOT DETECTED Final   Norovirus GI/GII NOT DETECTED NOT DETECTED Final   Rotavirus A NOT DETECTED NOT DETECTED Final   Sapovirus (I, II, IV, and V) NOT DETECTED NOT DETECTED Final    Comment: Performed at [facility], [address] [place], [place] [address]  C Difficile Quick Screen w PCR reflex     Status: Abnormal   Collection Time: 04/26/20  3:24 AM  Result Value Ref Range Status   C Diff antigen POSITIVE (A) NEGATIVE Final   C Diff toxin POSITIVE (A) NEGATIVE Final   C Diff interpretation Toxin producing C. difficile detected.  Final    Comment: CRITICAL RESULT CALLED TO, READ BACK BY AND VERIFIED WITH: RN Rico Sheehan AT [id number] 04/26/20 BY MM Performed at [facility], [place] [address] [place], [place] [address]      Labs: BNP (last 3 results) Recent Labs    09/03/19 1745  BNP 466.5*   Basic Metabolic Panel: Recent Labs  Lab 04/24/20 1437 04/25/20 0401 04/26/20 0141 04/27/20 0340  NA 139 138 136 141  K 3.3* 3.3* 4.2 3.4*  CL 93* 94* 93* 96*  CO2 27 28 24 26   GLUCOSE 136* 82 158* 86  BUN 13 25* 42* 22  CREATININE 4.70* 6.37* 9.32* 5.97*  CALCIUM 8.1* 7.9* 8.6* 9.0   Liver Function Tests: Recent Labs  Lab 04/24/20 1437 04/25/20 0401  AST 72* 45*  ALT 36 26  ALKPHOS 452* 340*  BILITOT 1.8* 2.2*  PROT 6.4* 5.3*  ALBUMIN 3.0* 2.4*   No results for input(s): LIPASE, AMYLASE in the last 168 hours. No results for input(s): AMMONIA in the last 168 hours. CBC: Recent Labs  Lab  04/24/20 1437  04/25/20 0401 04/26/20 0141  WBC 2.7* 2.4* 2.8*  NEUTROABS 1.3*  --  1.5*  HGB 13.2 11.1* 12.8*  HCT 42.5 34.6* 39.6  MCV 110.7* 105.8* 105.0*  PLT 128* 122* 143*   Cardiac Enzymes: No results for input(s): CKTOTAL, CKMB, CKMBINDEX, TROPONINI in the last 168 hours. BNP: Invalid input(s): POCBNP CBG: Recent Labs  Lab 04/26/20 0752 04/26/20 1151 04/26/20 1236 04/26/20 1903 04/27/20 0808  GLUCAP 132* 131* 135* 145* 103*   D-Dimer No results for input(s): DDIMER in the last 72 hours. Hgb A1c No results for input(s): HGBA1C in the last 72 hours. Lipid Profile No results for input(s): CHOL, HDL, LDLCALC, TRIG, CHOLHDL, LDLDIRECT in the last 72 hours. Thyroid function studies No results for input(s): TSH, T4TOTAL, T3FREE, THYROIDAB in the last 72 hours.  Invalid input(s): FREET3 Anemia work up No results for input(s): VITAMINB12, FOLATE, FERRITIN, TIBC, IRON, RETICCTPCT in the last 72 hours. Urinalysis    Component Value Date/Time   COLORURINE AMBER (A) 12/31/2015 0515   APPEARANCEUR CLOUDY (A) 12/31/2015 0515   LABSPEC 1.016 12/31/2015 0515   PHURINE 7.0 12/31/2015 0515   GLUCOSEU NEGATIVE 12/31/2015 0515   HGBUR NEGATIVE 12/31/2015 0515   BILIRUBINUR SMALL (A) 12/31/2015 0515   KETONESUR 15 (A) 12/31/2015 0515   PROTEINUR 100 (A) 12/31/2015 0515   UROBILINOGEN 2.0 (H) 08/15/2013 1916   NITRITE NEGATIVE 12/31/2015 0515   LEUKOCYTESUR NEGATIVE 12/31/2015 0515   Sepsis Labs Invalid input(s): PROCALCITONIN,  WBC,  LACTICIDVEN Microbiology Recent Results (from the past 240 hour(s))  Blood Culture (routine x 2)     Status: None (Preliminary result)   Collection Time: 04/24/20  3:53 PM   Specimen: BLOOD RIGHT HAND  Result Value Ref Range Status   Specimen Description BLOOD RIGHT HAND  Final   Special Requests   Final    BOTTLES DRAWN AEROBIC AND ANAEROBIC Blood Culture results may not be optimal due to an inadequate volume of blood received in culture  bottles   Culture   Final    NO GROWTH 3 DAYS Performed at [facility], [place] [address], [place], [place] [address]    Report Status PENDING  Incomplete  Blood Culture (routine x 2)     Status: None (Preliminary result)   Collection Time: 04/24/20  3:53 PM   Specimen: BLOOD  Result Value Ref Range Status   Specimen Description BLOOD RIGHT ANTECUBITAL  Final   Special Requests   Final    BOTTLES DRAWN AEROBIC AND ANAEROBIC Blood Culture adequate volume   Culture   Final    NO GROWTH 3 DAYS Performed at [facility], [place] [address], [place], [place] [address]    Report Status PENDING  Incomplete  SARS Coronavirus 2 by RT PCR (hospital order, performed in [facility] hospital lab) Nasopharyngeal Nasopharyngeal Swab     Status: None   Collection Time: 04/24/20  3:55 PM   Specimen: Nasopharyngeal Swab  Result Value Ref Range Status   SARS Coronavirus 2 NEGATIVE NEGATIVE Final    Comment: (NOTE) SARS-CoV-2 target nucleic acids are NOT DETECTED.  The SARS-CoV-2 RNA is generally detectable in upper and lower respiratory specimens during the acute phase of infection. The lowest concentration of SARS-CoV-2 viral copies this assay can detect is 250 copies / mL. A negative result does not preclude SARS-CoV-2 infection and should not be used as the sole basis for treatment or other patient management decisions.  A negative result may occur with improper specimen collection /  handling, submission of specimen other than nasopharyngeal swab, presence of viral mutation(s) within the areas targeted by this assay, and inadequate number of viral copies (<250 copies / mL). A negative result must be combined with clinical observations, patient history, and epidemiological information.  Fact Sheet for Patients:   StrictlyIdeas.no  Fact Sheet for Healthcare Providers: BankingDealers.co.za  This test is not yet approved or  cleared by the  [place] FDA and has been authorized for detection and/or diagnosis of SARS-CoV-2 by FDA under an Emergency Use Authorization (EUA).  This EUA will remain in effect (meaning this test can be used) for the duration of the COVID-19 declaration under Section 564(b)(1) of the Act, 21 U.S.C. section 360bbb-3(b)(1), unless the authorization is terminated or revoked sooner.  Performed at [facility], [place] [address], [place], [place] [address]   Respiratory Panel by PCR     Status: Abnormal   Collection Time: 04/24/20  6:57 PM   Specimen: Nasopharyngeal Swab; Respiratory  Result Value Ref Range Status   Adenovirus NOT DETECTED NOT DETECTED Final   Coronavirus 229E NOT DETECTED NOT DETECTED Final    Comment: (NOTE) The Coronavirus on the Respiratory Panel, DOES NOT test for the novel  Coronavirus (2019 nCoV)    Coronavirus HKU1 NOT DETECTED NOT DETECTED Final   Coronavirus NL63 NOT DETECTED NOT DETECTED Final   Coronavirus OC43 NOT DETECTED NOT DETECTED Final   Metapneumovirus DETECTED (A) NOT DETECTED Final   Rhinovirus / Enterovirus NOT DETECTED NOT DETECTED Final   Influenza A NOT DETECTED NOT DETECTED Final   Influenza B NOT DETECTED NOT DETECTED Final   Parainfluenza Virus 1 NOT DETECTED NOT DETECTED Final   Parainfluenza Virus 2 NOT DETECTED NOT DETECTED Final   Parainfluenza Virus 3 NOT DETECTED NOT DETECTED Final   Parainfluenza Virus 4 NOT DETECTED NOT DETECTED Final   Respiratory Syncytial Virus NOT DETECTED NOT DETECTED Final   Bordetella pertussis NOT DETECTED NOT DETECTED Final   Chlamydophila pneumoniae NOT DETECTED NOT DETECTED Final   Mycoplasma pneumoniae NOT DETECTED NOT DETECTED Final    Comment: Performed at [facility], [place]. [address], [place], [place] [address]  MRSA PCR Screening     Status: None   Collection Time: 04/24/20 11:11 PM   Specimen: Nasopharyngeal  Result Value Ref Range Status   MRSA by PCR NEGATIVE NEGATIVE Final    Comment:         The GeneXpert MRSA Assay (FDA approved for NASAL specimens only), is one component of a comprehensive MRSA colonization surveillance program. It is not intended to diagnose MRSA infection nor to guide or monitor treatment for MRSA infections. Performed at [facility], [place] [address] [place], [place] [address]   Gastrointestinal Panel by PCR , Stool     Status: None   Collection Time: 04/25/20  4:43 AM   Specimen: STOOL  Result Value Ref Range Status   Campylobacter species NOT DETECTED NOT DETECTED Final   Plesimonas shigelloides NOT DETECTED NOT DETECTED Final   Salmonella species NOT DETECTED NOT DETECTED Final   Yersinia enterocolitica NOT DETECTED NOT DETECTED Final   Vibrio species NOT DETECTED NOT DETECTED Final   Vibrio cholerae NOT DETECTED NOT DETECTED Final   Enteroaggregative E coli (EAEC) NOT DETECTED NOT DETECTED Final   Enteropathogenic E coli (EPEC) NOT DETECTED NOT DETECTED Final   Enterotoxigenic E coli (ETEC) NOT DETECTED NOT DETECTED Final   Shiga like toxin producing E coli (STEC) NOT DETECTED NOT  DETECTED Final   Shigella/Enteroinvasive E coli (EIEC) NOT DETECTED NOT DETECTED Final   Cryptosporidium NOT DETECTED NOT DETECTED Final   Cyclospora cayetanensis NOT DETECTED NOT DETECTED Final   Entamoeba histolytica NOT DETECTED NOT DETECTED Final   Giardia lamblia NOT DETECTED NOT DETECTED Final   Adenovirus F40/41 NOT DETECTED NOT DETECTED Final   Astrovirus NOT DETECTED NOT DETECTED Final   Norovirus GI/GII NOT DETECTED NOT DETECTED Final   Rotavirus A NOT DETECTED NOT DETECTED Final   Sapovirus (I, II, IV, and V) NOT DETECTED NOT DETECTED Final    Comment: Performed at [facility], [place] [place], [place] [address]  C Difficile Quick Screen w PCR reflex     Status: Abnormal   Collection Time: 04/26/20  3:24 AM  Result Value Ref Range Status   C Diff antigen POSITIVE (A) NEGATIVE Final   C Diff toxin POSITIVE (A) NEGATIVE  Final   C Diff interpretation Toxin producing C. difficile detected.  Final    Comment: CRITICAL RESULT CALLED TO, READ BACK BY AND VERIFIED WITH: RN Rico Sheehan AT [phone] 04/26/20 BY MM Performed at [facility], [place] [address] [place], [place] [address]      Time coordinating discharge: 45 minutes  SIGNED:   Tawni Millers, MD  [facility] 04/27/2020, 12:01 PM

## 2020-04-27 NOTE — Progress Notes (Signed)
  [facility] Progress Note   Assessment/ Plan:    OP HD: [facility] MWF   4h  65.5kg  2/2 bath  P2  Hep 1800   LUE AVF   - venofer 100 qhd ordered not started yet  - hect 2 ug tiw   Assessment/ Plan: 1. Bronchitis/ metapneumovirus -cough, fevers, CXR neg, COVID neg. Fevers resolving. Per pmd. 2. 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 On contact precautions now and oral vanc 3. 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  4. ESRD - on HD MWF. HD today on holiday schedule.  5. Hypotension - chronic issue on midodrine 10 bid nonhd days and 10pre HD on mwf. Will simplify to 10 bid every day while here. 6. Cirrhosis of the liver -  Stable with no signs of acute decompensation, continue close monitoring.  7. 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  8. Anemia ckd - Hb > 10, no esa needed. No IV fe for now w/ infections  9. T2DM - SSI per pmd  Subjective:    Seen in room.  Still having diarrhea.  Trying to eat breakfast.  For HD tomorrow.     Objective:   BP 109/67 (BP Location: Right Arm)   Pulse 78   Temp 98.2 F (36.8 C) (Oral)   Resp 16   Ht 5\' 3"  (1.6 m)   Wt 73 kg   SpO2 100%   BMI 28.51 kg/m   Physical Exam: Gen: Sitting in chair, eating breakfast CVS: RRR Resp: occ wheezes Abd: soft Ext: trace LE edema ACCESS: LUE AVF + T/B  Labs: BMET Recent Labs  Lab 04/24/20 1437 04/25/20 0401 04/26/20 0141 04/27/20 0340  NA 139 138 136 141  K 3.3* 3.3* 4.2 3.4*  CL 93* 94* 93* 96*  CO2 27 28 24 26   GLUCOSE 136* 82 158* 86  BUN 13 25* 42* 22  CREATININE 4.70* 6.37* 9.32* 5.97*  CALCIUM 8.1* 7.9* 8.6* 9.0   CBC Recent Labs  Lab 04/24/20 1437 04/25/20 0401 04/26/20 0141  WBC 2.7* 2.4* 2.8*  NEUTROABS 1.3*  --  1.5*  HGB 13.2 11.1* 12.8*  HCT 42.5 34.6* 39.6  MCV 110.7* 105.8* 105.0*  PLT 128* 122* 143*      Medications:    . aspirin EC  81 mg Oral Daily  . azaTHIOprine  50 mg Oral Q M,W,F-HD  . calcium  acetate  1,334 mg Oral TID AC  . Chlorhexidine Gluconate Cloth  6 each Topical Q0600  . cycloSPORINE modified  100 mg Oral QHS  . cycloSPORINE modified  75 mg Oral Daily  . dorzolamide-timolol  1 drop Both Eyes BID  . fluorometholone  1 drop Right Eye QID  . Gerhardt's butt cream   Topical Daily  . heparin  5,000 Units Subcutaneous Q8H  . insulin aspart  0-6 Units Subcutaneous TID WC  . midodrine  10 mg Oral BID WC  . pantoprazole  40 mg Oral Daily  . pravastatin  10 mg Oral QHS  . predniSONE  5 mg Oral Q breakfast  . sulfamethoxazole-trimethoprim  1 tablet Oral Q M,W,F-HD  . valGANciclovir  450 mg Oral Once per day on Mon Fri  . vancomycin  125 mg Oral QID     Madelon Lips MD 04/27/2020, 9:10 AM

## 2020-04-28 DIAGNOSIS — N186 End stage renal disease: Secondary | ICD-10-CM | POA: Diagnosis not present

## 2020-04-28 DIAGNOSIS — E876 Hypokalemia: Secondary | ICD-10-CM | POA: Diagnosis not present

## 2020-04-28 DIAGNOSIS — Z992 Dependence on renal dialysis: Secondary | ICD-10-CM | POA: Diagnosis not present

## 2020-04-28 DIAGNOSIS — T8579XA Infection and inflammatory reaction due to other internal prosthetic devices, implants and grafts, initial encounter: Secondary | ICD-10-CM | POA: Diagnosis not present

## 2020-04-28 DIAGNOSIS — N2581 Secondary hyperparathyroidism of renal origin: Secondary | ICD-10-CM | POA: Diagnosis not present

## 2020-04-29 LAB — CULTURE, BLOOD (ROUTINE X 2)
Culture: NO GROWTH
Culture: NO GROWTH
Special Requests: ADEQUATE

## 2020-05-01 DIAGNOSIS — E876 Hypokalemia: Secondary | ICD-10-CM | POA: Diagnosis not present

## 2020-05-01 DIAGNOSIS — N2581 Secondary hyperparathyroidism of renal origin: Secondary | ICD-10-CM | POA: Diagnosis not present

## 2020-05-01 DIAGNOSIS — T8579XA Infection and inflammatory reaction due to other internal prosthetic devices, implants and grafts, initial encounter: Secondary | ICD-10-CM | POA: Diagnosis not present

## 2020-05-01 DIAGNOSIS — Z992 Dependence on renal dialysis: Secondary | ICD-10-CM | POA: Diagnosis not present

## 2020-05-01 DIAGNOSIS — N186 End stage renal disease: Secondary | ICD-10-CM | POA: Diagnosis not present

## 2020-05-04 DIAGNOSIS — T8579XA Infection and inflammatory reaction due to other internal prosthetic devices, implants and grafts, initial encounter: Secondary | ICD-10-CM | POA: Diagnosis not present

## 2020-05-04 DIAGNOSIS — Z992 Dependence on renal dialysis: Secondary | ICD-10-CM | POA: Diagnosis not present

## 2020-05-04 DIAGNOSIS — E876 Hypokalemia: Secondary | ICD-10-CM | POA: Diagnosis not present

## 2020-05-04 DIAGNOSIS — N186 End stage renal disease: Secondary | ICD-10-CM | POA: Diagnosis not present

## 2020-05-04 DIAGNOSIS — N2581 Secondary hyperparathyroidism of renal origin: Secondary | ICD-10-CM | POA: Diagnosis not present

## 2020-05-05 DIAGNOSIS — J208 Acute bronchitis due to other specified organisms: Secondary | ICD-10-CM | POA: Diagnosis not present

## 2020-05-05 DIAGNOSIS — H538 Other visual disturbances: Secondary | ICD-10-CM | POA: Diagnosis not present

## 2020-05-05 DIAGNOSIS — A0472 Enterocolitis due to Clostridium difficile, not specified as recurrent: Secondary | ICD-10-CM | POA: Diagnosis not present

## 2020-05-06 DIAGNOSIS — T8579XA Infection and inflammatory reaction due to other internal prosthetic devices, implants and grafts, initial encounter: Secondary | ICD-10-CM | POA: Diagnosis not present

## 2020-05-06 DIAGNOSIS — E876 Hypokalemia: Secondary | ICD-10-CM | POA: Diagnosis not present

## 2020-05-06 DIAGNOSIS — Z992 Dependence on renal dialysis: Secondary | ICD-10-CM | POA: Diagnosis not present

## 2020-05-06 DIAGNOSIS — N186 End stage renal disease: Secondary | ICD-10-CM | POA: Diagnosis not present

## 2020-05-06 DIAGNOSIS — T86819 Unspecified complication of lung transplant: Secondary | ICD-10-CM | POA: Diagnosis not present

## 2020-05-06 DIAGNOSIS — N2581 Secondary hyperparathyroidism of renal origin: Secondary | ICD-10-CM | POA: Diagnosis not present

## 2020-05-06 DIAGNOSIS — D509 Iron deficiency anemia, unspecified: Secondary | ICD-10-CM | POA: Diagnosis not present

## 2020-05-08 DIAGNOSIS — E876 Hypokalemia: Secondary | ICD-10-CM | POA: Diagnosis not present

## 2020-05-08 DIAGNOSIS — T8579XA Infection and inflammatory reaction due to other internal prosthetic devices, implants and grafts, initial encounter: Secondary | ICD-10-CM | POA: Diagnosis not present

## 2020-05-08 DIAGNOSIS — D509 Iron deficiency anemia, unspecified: Secondary | ICD-10-CM | POA: Diagnosis not present

## 2020-05-08 DIAGNOSIS — N2581 Secondary hyperparathyroidism of renal origin: Secondary | ICD-10-CM | POA: Diagnosis not present

## 2020-05-08 DIAGNOSIS — N186 End stage renal disease: Secondary | ICD-10-CM | POA: Diagnosis not present

## 2020-05-08 DIAGNOSIS — Z992 Dependence on renal dialysis: Secondary | ICD-10-CM | POA: Diagnosis not present

## 2020-05-11 DIAGNOSIS — D509 Iron deficiency anemia, unspecified: Secondary | ICD-10-CM | POA: Diagnosis not present

## 2020-05-11 DIAGNOSIS — N2581 Secondary hyperparathyroidism of renal origin: Secondary | ICD-10-CM | POA: Diagnosis not present

## 2020-05-11 DIAGNOSIS — N186 End stage renal disease: Secondary | ICD-10-CM | POA: Diagnosis not present

## 2020-05-11 DIAGNOSIS — T8579XA Infection and inflammatory reaction due to other internal prosthetic devices, implants and grafts, initial encounter: Secondary | ICD-10-CM | POA: Diagnosis not present

## 2020-05-11 DIAGNOSIS — Z992 Dependence on renal dialysis: Secondary | ICD-10-CM | POA: Diagnosis not present

## 2020-05-11 DIAGNOSIS — E876 Hypokalemia: Secondary | ICD-10-CM | POA: Diagnosis not present

## 2020-05-12 DIAGNOSIS — D849 Immunodeficiency, unspecified: Secondary | ICD-10-CM | POA: Diagnosis not present

## 2020-05-12 DIAGNOSIS — Z79899 Other long term (current) drug therapy: Secondary | ICD-10-CM | POA: Diagnosis not present

## 2020-05-12 DIAGNOSIS — N185 Chronic kidney disease, stage 5: Secondary | ICD-10-CM | POA: Diagnosis not present

## 2020-05-12 DIAGNOSIS — Z8711 Personal history of peptic ulcer disease: Secondary | ICD-10-CM | POA: Diagnosis not present

## 2020-05-12 DIAGNOSIS — Z992 Dependence on renal dialysis: Secondary | ICD-10-CM | POA: Diagnosis not present

## 2020-05-12 DIAGNOSIS — T86818 Other complications of lung transplant: Secondary | ICD-10-CM | POA: Diagnosis not present

## 2020-05-12 DIAGNOSIS — G629 Polyneuropathy, unspecified: Secondary | ICD-10-CM | POA: Diagnosis not present

## 2020-05-12 DIAGNOSIS — D8989 Other specified disorders involving the immune mechanism, not elsewhere classified: Secondary | ICD-10-CM | POA: Diagnosis not present

## 2020-05-12 DIAGNOSIS — Z5181 Encounter for therapeutic drug level monitoring: Secondary | ICD-10-CM | POA: Diagnosis not present

## 2020-05-12 DIAGNOSIS — D84821 Immunodeficiency due to drugs: Secondary | ICD-10-CM | POA: Diagnosis not present

## 2020-05-12 DIAGNOSIS — T86810 Lung transplant rejection: Secondary | ICD-10-CM | POA: Diagnosis not present

## 2020-05-12 DIAGNOSIS — Z942 Lung transplant status: Secondary | ICD-10-CM | POA: Diagnosis not present

## 2020-05-13 DIAGNOSIS — E876 Hypokalemia: Secondary | ICD-10-CM | POA: Diagnosis not present

## 2020-05-13 DIAGNOSIS — T8579XA Infection and inflammatory reaction due to other internal prosthetic devices, implants and grafts, initial encounter: Secondary | ICD-10-CM | POA: Diagnosis not present

## 2020-05-13 DIAGNOSIS — N2581 Secondary hyperparathyroidism of renal origin: Secondary | ICD-10-CM | POA: Diagnosis not present

## 2020-05-13 DIAGNOSIS — Z992 Dependence on renal dialysis: Secondary | ICD-10-CM | POA: Diagnosis not present

## 2020-05-13 DIAGNOSIS — D509 Iron deficiency anemia, unspecified: Secondary | ICD-10-CM | POA: Diagnosis not present

## 2020-05-13 DIAGNOSIS — N186 End stage renal disease: Secondary | ICD-10-CM | POA: Diagnosis not present

## 2020-05-15 DIAGNOSIS — T8579XA Infection and inflammatory reaction due to other internal prosthetic devices, implants and grafts, initial encounter: Secondary | ICD-10-CM | POA: Diagnosis not present

## 2020-05-15 DIAGNOSIS — N2581 Secondary hyperparathyroidism of renal origin: Secondary | ICD-10-CM | POA: Diagnosis not present

## 2020-05-15 DIAGNOSIS — E876 Hypokalemia: Secondary | ICD-10-CM | POA: Diagnosis not present

## 2020-05-15 DIAGNOSIS — Z992 Dependence on renal dialysis: Secondary | ICD-10-CM | POA: Diagnosis not present

## 2020-05-15 DIAGNOSIS — D509 Iron deficiency anemia, unspecified: Secondary | ICD-10-CM | POA: Diagnosis not present

## 2020-05-15 DIAGNOSIS — N186 End stage renal disease: Secondary | ICD-10-CM | POA: Diagnosis not present

## 2020-05-18 DIAGNOSIS — N186 End stage renal disease: Secondary | ICD-10-CM | POA: Diagnosis not present

## 2020-05-18 DIAGNOSIS — N2581 Secondary hyperparathyroidism of renal origin: Secondary | ICD-10-CM | POA: Diagnosis not present

## 2020-05-18 DIAGNOSIS — T8579XA Infection and inflammatory reaction due to other internal prosthetic devices, implants and grafts, initial encounter: Secondary | ICD-10-CM | POA: Diagnosis not present

## 2020-05-18 DIAGNOSIS — D509 Iron deficiency anemia, unspecified: Secondary | ICD-10-CM | POA: Diagnosis not present

## 2020-05-18 DIAGNOSIS — Z992 Dependence on renal dialysis: Secondary | ICD-10-CM | POA: Diagnosis not present

## 2020-05-18 DIAGNOSIS — E876 Hypokalemia: Secondary | ICD-10-CM | POA: Diagnosis not present

## 2020-05-20 DIAGNOSIS — N186 End stage renal disease: Secondary | ICD-10-CM | POA: Diagnosis not present

## 2020-05-20 DIAGNOSIS — D509 Iron deficiency anemia, unspecified: Secondary | ICD-10-CM | POA: Diagnosis not present

## 2020-05-20 DIAGNOSIS — Z992 Dependence on renal dialysis: Secondary | ICD-10-CM | POA: Diagnosis not present

## 2020-05-20 DIAGNOSIS — T8579XA Infection and inflammatory reaction due to other internal prosthetic devices, implants and grafts, initial encounter: Secondary | ICD-10-CM | POA: Diagnosis not present

## 2020-05-20 DIAGNOSIS — E876 Hypokalemia: Secondary | ICD-10-CM | POA: Diagnosis not present

## 2020-05-20 DIAGNOSIS — N2581 Secondary hyperparathyroidism of renal origin: Secondary | ICD-10-CM | POA: Diagnosis not present

## 2020-05-21 DIAGNOSIS — H11823 Conjunctivochalasis, bilateral: Secondary | ICD-10-CM | POA: Diagnosis not present

## 2020-05-21 DIAGNOSIS — H04123 Dry eye syndrome of bilateral lacrimal glands: Secondary | ICD-10-CM | POA: Diagnosis not present

## 2020-05-21 DIAGNOSIS — H04223 Epiphora due to insufficient drainage, bilateral lacrimal glands: Secondary | ICD-10-CM | POA: Diagnosis not present

## 2020-05-22 DIAGNOSIS — Z992 Dependence on renal dialysis: Secondary | ICD-10-CM | POA: Diagnosis not present

## 2020-05-22 DIAGNOSIS — E876 Hypokalemia: Secondary | ICD-10-CM | POA: Diagnosis not present

## 2020-05-22 DIAGNOSIS — D509 Iron deficiency anemia, unspecified: Secondary | ICD-10-CM | POA: Diagnosis not present

## 2020-05-22 DIAGNOSIS — T8579XA Infection and inflammatory reaction due to other internal prosthetic devices, implants and grafts, initial encounter: Secondary | ICD-10-CM | POA: Diagnosis not present

## 2020-05-22 DIAGNOSIS — N186 End stage renal disease: Secondary | ICD-10-CM | POA: Diagnosis not present

## 2020-05-22 DIAGNOSIS — N2581 Secondary hyperparathyroidism of renal origin: Secondary | ICD-10-CM | POA: Diagnosis not present

## 2020-05-25 DIAGNOSIS — T8579XA Infection and inflammatory reaction due to other internal prosthetic devices, implants and grafts, initial encounter: Secondary | ICD-10-CM | POA: Diagnosis not present

## 2020-05-25 DIAGNOSIS — D509 Iron deficiency anemia, unspecified: Secondary | ICD-10-CM | POA: Diagnosis not present

## 2020-05-25 DIAGNOSIS — E1122 Type 2 diabetes mellitus with diabetic chronic kidney disease: Secondary | ICD-10-CM | POA: Diagnosis not present

## 2020-05-25 DIAGNOSIS — G4700 Insomnia, unspecified: Secondary | ICD-10-CM | POA: Diagnosis not present

## 2020-05-25 DIAGNOSIS — E782 Mixed hyperlipidemia: Secondary | ICD-10-CM | POA: Diagnosis not present

## 2020-05-25 DIAGNOSIS — F329 Major depressive disorder, single episode, unspecified: Secondary | ICD-10-CM | POA: Diagnosis not present

## 2020-05-25 DIAGNOSIS — N2581 Secondary hyperparathyroidism of renal origin: Secondary | ICD-10-CM | POA: Diagnosis not present

## 2020-05-25 DIAGNOSIS — N186 End stage renal disease: Secondary | ICD-10-CM | POA: Diagnosis not present

## 2020-05-25 DIAGNOSIS — I5032 Chronic diastolic (congestive) heart failure: Secondary | ICD-10-CM | POA: Diagnosis not present

## 2020-05-25 DIAGNOSIS — H409 Unspecified glaucoma: Secondary | ICD-10-CM | POA: Diagnosis not present

## 2020-05-25 DIAGNOSIS — K219 Gastro-esophageal reflux disease without esophagitis: Secondary | ICD-10-CM | POA: Diagnosis not present

## 2020-05-25 DIAGNOSIS — Z992 Dependence on renal dialysis: Secondary | ICD-10-CM | POA: Diagnosis not present

## 2020-05-25 DIAGNOSIS — I10 Essential (primary) hypertension: Secondary | ICD-10-CM | POA: Diagnosis not present

## 2020-05-25 DIAGNOSIS — E876 Hypokalemia: Secondary | ICD-10-CM | POA: Diagnosis not present

## 2020-05-25 DIAGNOSIS — I270 Primary pulmonary hypertension: Secondary | ICD-10-CM | POA: Diagnosis not present

## 2020-05-27 DIAGNOSIS — Z992 Dependence on renal dialysis: Secondary | ICD-10-CM | POA: Diagnosis not present

## 2020-05-27 DIAGNOSIS — D509 Iron deficiency anemia, unspecified: Secondary | ICD-10-CM | POA: Diagnosis not present

## 2020-05-27 DIAGNOSIS — N2581 Secondary hyperparathyroidism of renal origin: Secondary | ICD-10-CM | POA: Diagnosis not present

## 2020-05-27 DIAGNOSIS — T8579XA Infection and inflammatory reaction due to other internal prosthetic devices, implants and grafts, initial encounter: Secondary | ICD-10-CM | POA: Diagnosis not present

## 2020-05-27 DIAGNOSIS — E876 Hypokalemia: Secondary | ICD-10-CM | POA: Diagnosis not present

## 2020-05-27 DIAGNOSIS — N186 End stage renal disease: Secondary | ICD-10-CM | POA: Diagnosis not present

## 2020-05-29 DIAGNOSIS — D509 Iron deficiency anemia, unspecified: Secondary | ICD-10-CM | POA: Diagnosis not present

## 2020-05-29 DIAGNOSIS — T8579XA Infection and inflammatory reaction due to other internal prosthetic devices, implants and grafts, initial encounter: Secondary | ICD-10-CM | POA: Diagnosis not present

## 2020-05-29 DIAGNOSIS — E876 Hypokalemia: Secondary | ICD-10-CM | POA: Diagnosis not present

## 2020-05-29 DIAGNOSIS — Z992 Dependence on renal dialysis: Secondary | ICD-10-CM | POA: Diagnosis not present

## 2020-05-29 DIAGNOSIS — N2581 Secondary hyperparathyroidism of renal origin: Secondary | ICD-10-CM | POA: Diagnosis not present

## 2020-05-29 DIAGNOSIS — N186 End stage renal disease: Secondary | ICD-10-CM | POA: Diagnosis not present

## 2020-06-01 DIAGNOSIS — T8579XA Infection and inflammatory reaction due to other internal prosthetic devices, implants and grafts, initial encounter: Secondary | ICD-10-CM | POA: Diagnosis not present

## 2020-06-01 DIAGNOSIS — N2581 Secondary hyperparathyroidism of renal origin: Secondary | ICD-10-CM | POA: Diagnosis not present

## 2020-06-01 DIAGNOSIS — D509 Iron deficiency anemia, unspecified: Secondary | ICD-10-CM | POA: Diagnosis not present

## 2020-06-01 DIAGNOSIS — Z992 Dependence on renal dialysis: Secondary | ICD-10-CM | POA: Diagnosis not present

## 2020-06-01 DIAGNOSIS — N186 End stage renal disease: Secondary | ICD-10-CM | POA: Diagnosis not present

## 2020-06-01 DIAGNOSIS — E876 Hypokalemia: Secondary | ICD-10-CM | POA: Diagnosis not present

## 2020-06-03 DIAGNOSIS — N186 End stage renal disease: Secondary | ICD-10-CM | POA: Diagnosis not present

## 2020-06-03 DIAGNOSIS — T8579XA Infection and inflammatory reaction due to other internal prosthetic devices, implants and grafts, initial encounter: Secondary | ICD-10-CM | POA: Diagnosis not present

## 2020-06-03 DIAGNOSIS — N2581 Secondary hyperparathyroidism of renal origin: Secondary | ICD-10-CM | POA: Diagnosis not present

## 2020-06-03 DIAGNOSIS — D509 Iron deficiency anemia, unspecified: Secondary | ICD-10-CM | POA: Diagnosis not present

## 2020-06-03 DIAGNOSIS — Z992 Dependence on renal dialysis: Secondary | ICD-10-CM | POA: Diagnosis not present

## 2020-06-03 DIAGNOSIS — E876 Hypokalemia: Secondary | ICD-10-CM | POA: Diagnosis not present

## 2020-06-05 DIAGNOSIS — N2581 Secondary hyperparathyroidism of renal origin: Secondary | ICD-10-CM | POA: Diagnosis not present

## 2020-06-05 DIAGNOSIS — D509 Iron deficiency anemia, unspecified: Secondary | ICD-10-CM | POA: Diagnosis not present

## 2020-06-05 DIAGNOSIS — E876 Hypokalemia: Secondary | ICD-10-CM | POA: Diagnosis not present

## 2020-06-05 DIAGNOSIS — T8579XA Infection and inflammatory reaction due to other internal prosthetic devices, implants and grafts, initial encounter: Secondary | ICD-10-CM | POA: Diagnosis not present

## 2020-06-05 DIAGNOSIS — Z992 Dependence on renal dialysis: Secondary | ICD-10-CM | POA: Diagnosis not present

## 2020-06-05 DIAGNOSIS — N186 End stage renal disease: Secondary | ICD-10-CM | POA: Diagnosis not present

## 2020-06-06 DIAGNOSIS — Z992 Dependence on renal dialysis: Secondary | ICD-10-CM | POA: Diagnosis not present

## 2020-06-06 DIAGNOSIS — N186 End stage renal disease: Secondary | ICD-10-CM | POA: Diagnosis not present

## 2020-06-06 DIAGNOSIS — T86819 Unspecified complication of lung transplant: Secondary | ICD-10-CM | POA: Diagnosis not present

## 2020-06-08 DIAGNOSIS — T8579XA Infection and inflammatory reaction due to other internal prosthetic devices, implants and grafts, initial encounter: Secondary | ICD-10-CM | POA: Diagnosis not present

## 2020-06-08 DIAGNOSIS — N2581 Secondary hyperparathyroidism of renal origin: Secondary | ICD-10-CM | POA: Diagnosis not present

## 2020-06-08 DIAGNOSIS — Z992 Dependence on renal dialysis: Secondary | ICD-10-CM | POA: Diagnosis not present

## 2020-06-08 DIAGNOSIS — E876 Hypokalemia: Secondary | ICD-10-CM | POA: Diagnosis not present

## 2020-06-08 DIAGNOSIS — N186 End stage renal disease: Secondary | ICD-10-CM | POA: Diagnosis not present

## 2020-06-10 DIAGNOSIS — T8579XA Infection and inflammatory reaction due to other internal prosthetic devices, implants and grafts, initial encounter: Secondary | ICD-10-CM | POA: Diagnosis not present

## 2020-06-10 DIAGNOSIS — N2581 Secondary hyperparathyroidism of renal origin: Secondary | ICD-10-CM | POA: Diagnosis not present

## 2020-06-10 DIAGNOSIS — N186 End stage renal disease: Secondary | ICD-10-CM | POA: Diagnosis not present

## 2020-06-10 DIAGNOSIS — Z992 Dependence on renal dialysis: Secondary | ICD-10-CM | POA: Diagnosis not present

## 2020-06-10 DIAGNOSIS — E876 Hypokalemia: Secondary | ICD-10-CM | POA: Diagnosis not present

## 2020-06-12 DIAGNOSIS — N186 End stage renal disease: Secondary | ICD-10-CM | POA: Diagnosis not present

## 2020-06-12 DIAGNOSIS — N2581 Secondary hyperparathyroidism of renal origin: Secondary | ICD-10-CM | POA: Diagnosis not present

## 2020-06-12 DIAGNOSIS — T8579XA Infection and inflammatory reaction due to other internal prosthetic devices, implants and grafts, initial encounter: Secondary | ICD-10-CM | POA: Diagnosis not present

## 2020-06-12 DIAGNOSIS — Z992 Dependence on renal dialysis: Secondary | ICD-10-CM | POA: Diagnosis not present

## 2020-06-12 DIAGNOSIS — E876 Hypokalemia: Secondary | ICD-10-CM | POA: Diagnosis not present

## 2020-06-15 DIAGNOSIS — N2581 Secondary hyperparathyroidism of renal origin: Secondary | ICD-10-CM | POA: Diagnosis not present

## 2020-06-15 DIAGNOSIS — Z992 Dependence on renal dialysis: Secondary | ICD-10-CM | POA: Diagnosis not present

## 2020-06-15 DIAGNOSIS — N186 End stage renal disease: Secondary | ICD-10-CM | POA: Diagnosis not present

## 2020-06-15 DIAGNOSIS — T8579XA Infection and inflammatory reaction due to other internal prosthetic devices, implants and grafts, initial encounter: Secondary | ICD-10-CM | POA: Diagnosis not present

## 2020-06-15 DIAGNOSIS — E876 Hypokalemia: Secondary | ICD-10-CM | POA: Diagnosis not present

## 2020-06-17 DIAGNOSIS — N186 End stage renal disease: Secondary | ICD-10-CM | POA: Diagnosis not present

## 2020-06-17 DIAGNOSIS — Z992 Dependence on renal dialysis: Secondary | ICD-10-CM | POA: Diagnosis not present

## 2020-06-17 DIAGNOSIS — E1129 Type 2 diabetes mellitus with other diabetic kidney complication: Secondary | ICD-10-CM | POA: Diagnosis not present

## 2020-06-17 DIAGNOSIS — E876 Hypokalemia: Secondary | ICD-10-CM | POA: Diagnosis not present

## 2020-06-17 DIAGNOSIS — N2581 Secondary hyperparathyroidism of renal origin: Secondary | ICD-10-CM | POA: Diagnosis not present

## 2020-06-17 DIAGNOSIS — T8579XA Infection and inflammatory reaction due to other internal prosthetic devices, implants and grafts, initial encounter: Secondary | ICD-10-CM | POA: Diagnosis not present

## 2020-06-18 DIAGNOSIS — Z298 Encounter for other specified prophylactic measures: Secondary | ICD-10-CM | POA: Diagnosis not present

## 2020-06-18 DIAGNOSIS — Z79899 Other long term (current) drug therapy: Secondary | ICD-10-CM | POA: Diagnosis not present

## 2020-06-18 DIAGNOSIS — Z942 Lung transplant status: Secondary | ICD-10-CM | POA: Diagnosis not present

## 2020-06-18 DIAGNOSIS — T86819 Unspecified complication of lung transplant: Secondary | ICD-10-CM | POA: Diagnosis not present

## 2020-06-18 DIAGNOSIS — Z5181 Encounter for therapeutic drug level monitoring: Secondary | ICD-10-CM | POA: Diagnosis not present

## 2020-06-19 DIAGNOSIS — N186 End stage renal disease: Secondary | ICD-10-CM | POA: Diagnosis not present

## 2020-06-19 DIAGNOSIS — T8579XA Infection and inflammatory reaction due to other internal prosthetic devices, implants and grafts, initial encounter: Secondary | ICD-10-CM | POA: Diagnosis not present

## 2020-06-19 DIAGNOSIS — Z992 Dependence on renal dialysis: Secondary | ICD-10-CM | POA: Diagnosis not present

## 2020-06-19 DIAGNOSIS — E876 Hypokalemia: Secondary | ICD-10-CM | POA: Diagnosis not present

## 2020-06-19 DIAGNOSIS — N2581 Secondary hyperparathyroidism of renal origin: Secondary | ICD-10-CM | POA: Diagnosis not present

## 2020-06-22 DIAGNOSIS — Z992 Dependence on renal dialysis: Secondary | ICD-10-CM | POA: Diagnosis not present

## 2020-06-22 DIAGNOSIS — N2581 Secondary hyperparathyroidism of renal origin: Secondary | ICD-10-CM | POA: Diagnosis not present

## 2020-06-22 DIAGNOSIS — N186 End stage renal disease: Secondary | ICD-10-CM | POA: Diagnosis not present

## 2020-06-22 DIAGNOSIS — T8579XA Infection and inflammatory reaction due to other internal prosthetic devices, implants and grafts, initial encounter: Secondary | ICD-10-CM | POA: Diagnosis not present

## 2020-06-22 DIAGNOSIS — E876 Hypokalemia: Secondary | ICD-10-CM | POA: Diagnosis not present

## 2020-06-24 DIAGNOSIS — N186 End stage renal disease: Secondary | ICD-10-CM | POA: Diagnosis not present

## 2020-06-24 DIAGNOSIS — T8579XA Infection and inflammatory reaction due to other internal prosthetic devices, implants and grafts, initial encounter: Secondary | ICD-10-CM | POA: Diagnosis not present

## 2020-06-24 DIAGNOSIS — N2581 Secondary hyperparathyroidism of renal origin: Secondary | ICD-10-CM | POA: Diagnosis not present

## 2020-06-24 DIAGNOSIS — E876 Hypokalemia: Secondary | ICD-10-CM | POA: Diagnosis not present

## 2020-06-24 DIAGNOSIS — Z992 Dependence on renal dialysis: Secondary | ICD-10-CM | POA: Diagnosis not present

## 2020-06-26 DIAGNOSIS — N186 End stage renal disease: Secondary | ICD-10-CM | POA: Diagnosis not present

## 2020-06-26 DIAGNOSIS — E876 Hypokalemia: Secondary | ICD-10-CM | POA: Diagnosis not present

## 2020-06-26 DIAGNOSIS — Z992 Dependence on renal dialysis: Secondary | ICD-10-CM | POA: Diagnosis not present

## 2020-06-26 DIAGNOSIS — N2581 Secondary hyperparathyroidism of renal origin: Secondary | ICD-10-CM | POA: Diagnosis not present

## 2020-06-26 DIAGNOSIS — T8579XA Infection and inflammatory reaction due to other internal prosthetic devices, implants and grafts, initial encounter: Secondary | ICD-10-CM | POA: Diagnosis not present

## 2020-06-28 DIAGNOSIS — K219 Gastro-esophageal reflux disease without esophagitis: Secondary | ICD-10-CM | POA: Diagnosis not present

## 2020-06-28 DIAGNOSIS — N186 End stage renal disease: Secondary | ICD-10-CM | POA: Diagnosis not present

## 2020-06-28 DIAGNOSIS — I5032 Chronic diastolic (congestive) heart failure: Secondary | ICD-10-CM | POA: Diagnosis not present

## 2020-06-28 DIAGNOSIS — E1122 Type 2 diabetes mellitus with diabetic chronic kidney disease: Secondary | ICD-10-CM | POA: Diagnosis not present

## 2020-06-28 DIAGNOSIS — I10 Essential (primary) hypertension: Secondary | ICD-10-CM | POA: Diagnosis not present

## 2020-06-28 DIAGNOSIS — I270 Primary pulmonary hypertension: Secondary | ICD-10-CM | POA: Diagnosis not present

## 2020-06-28 DIAGNOSIS — E782 Mixed hyperlipidemia: Secondary | ICD-10-CM | POA: Diagnosis not present

## 2020-06-28 DIAGNOSIS — F329 Major depressive disorder, single episode, unspecified: Secondary | ICD-10-CM | POA: Diagnosis not present

## 2020-06-28 DIAGNOSIS — H409 Unspecified glaucoma: Secondary | ICD-10-CM | POA: Diagnosis not present

## 2020-06-28 DIAGNOSIS — G4700 Insomnia, unspecified: Secondary | ICD-10-CM | POA: Diagnosis not present

## 2020-06-29 DIAGNOSIS — N186 End stage renal disease: Secondary | ICD-10-CM | POA: Diagnosis not present

## 2020-06-29 DIAGNOSIS — E876 Hypokalemia: Secondary | ICD-10-CM | POA: Diagnosis not present

## 2020-06-29 DIAGNOSIS — N2581 Secondary hyperparathyroidism of renal origin: Secondary | ICD-10-CM | POA: Diagnosis not present

## 2020-06-29 DIAGNOSIS — T8579XA Infection and inflammatory reaction due to other internal prosthetic devices, implants and grafts, initial encounter: Secondary | ICD-10-CM | POA: Diagnosis not present

## 2020-06-29 DIAGNOSIS — G4733 Obstructive sleep apnea (adult) (pediatric): Secondary | ICD-10-CM | POA: Diagnosis not present

## 2020-06-29 DIAGNOSIS — Z992 Dependence on renal dialysis: Secondary | ICD-10-CM | POA: Diagnosis not present

## 2020-07-01 DIAGNOSIS — E876 Hypokalemia: Secondary | ICD-10-CM | POA: Diagnosis not present

## 2020-07-01 DIAGNOSIS — T8579XA Infection and inflammatory reaction due to other internal prosthetic devices, implants and grafts, initial encounter: Secondary | ICD-10-CM | POA: Diagnosis not present

## 2020-07-01 DIAGNOSIS — N186 End stage renal disease: Secondary | ICD-10-CM | POA: Diagnosis not present

## 2020-07-01 DIAGNOSIS — N2581 Secondary hyperparathyroidism of renal origin: Secondary | ICD-10-CM | POA: Diagnosis not present

## 2020-07-01 DIAGNOSIS — Z992 Dependence on renal dialysis: Secondary | ICD-10-CM | POA: Diagnosis not present

## 2020-07-03 DIAGNOSIS — T8579XA Infection and inflammatory reaction due to other internal prosthetic devices, implants and grafts, initial encounter: Secondary | ICD-10-CM | POA: Diagnosis not present

## 2020-07-03 DIAGNOSIS — E876 Hypokalemia: Secondary | ICD-10-CM | POA: Diagnosis not present

## 2020-07-03 DIAGNOSIS — N186 End stage renal disease: Secondary | ICD-10-CM | POA: Diagnosis not present

## 2020-07-03 DIAGNOSIS — N2581 Secondary hyperparathyroidism of renal origin: Secondary | ICD-10-CM | POA: Diagnosis not present

## 2020-07-03 DIAGNOSIS — Z992 Dependence on renal dialysis: Secondary | ICD-10-CM | POA: Diagnosis not present

## 2020-07-06 ENCOUNTER — Encounter (HOSPITAL_COMMUNITY): Payer: Self-pay | Admitting: Emergency Medicine

## 2020-07-06 ENCOUNTER — Ambulatory Visit (HOSPITAL_COMMUNITY)
Admission: EM | Admit: 2020-07-06 | Discharge: 2020-07-06 | Disposition: A | Discharge status: Home / Self Care | Payer: Medicare Other | Attending: Internal Medicine | Admitting: Internal Medicine

## 2020-07-06 ENCOUNTER — Other Ambulatory Visit: Payer: Self-pay

## 2020-07-06 DIAGNOSIS — M25512 Pain in left shoulder: Secondary | ICD-10-CM | POA: Diagnosis not present

## 2020-07-06 DIAGNOSIS — N2581 Secondary hyperparathyroidism of renal origin: Secondary | ICD-10-CM | POA: Diagnosis not present

## 2020-07-06 DIAGNOSIS — Z992 Dependence on renal dialysis: Secondary | ICD-10-CM | POA: Diagnosis not present

## 2020-07-06 DIAGNOSIS — E876 Hypokalemia: Secondary | ICD-10-CM | POA: Diagnosis not present

## 2020-07-06 DIAGNOSIS — N186 End stage renal disease: Secondary | ICD-10-CM | POA: Diagnosis not present

## 2020-07-06 DIAGNOSIS — T8579XA Infection and inflammatory reaction due to other internal prosthetic devices, implants and grafts, initial encounter: Secondary | ICD-10-CM | POA: Diagnosis not present

## 2020-07-06 MED ORDER — DICLOFENAC SODIUM 1 % EX GEL: 2.0000 g | Freq: Four times a day (QID) | CUTANEOUS | 0 refills | Status: DC

## 2020-07-06 MED ORDER — TIZANIDINE HCL 2 MG PO CAPS: 2.0000 mg | ORAL_CAPSULE | Freq: Every evening | ORAL | 0 refills | Status: DC | PRN

## 2020-07-06 NOTE — Discharge Instructions (Signed)
Follow up at urgent care if pain worsens, Can follow up with primary care doctor to assess needs for physical therapy and further management   Apply gel for pain management four times a day and take muscle relaxer at bedtime to assist with relief, can use heating pads 15 minutes on and off for comfort as needed

## 2020-07-06 NOTE — ED Triage Notes (Signed)
Pt presents with left shoulder pain after falling on ice 2-3 weeks ago. Any movement give pain.  After calling pt in lobby, pt had difficulty standing up and walking. Assisted pt back to chair until able to obtain wheelchair. Pt appeared to be lethargic. Upon entering exam room pt was alert and oriented.

## 2020-07-06 NOTE — ED Provider Notes (Signed)
[facility]    CSN: [id number] Arrival date & time: 07/06/20  1318      History   Chief Complaint No chief complaint on file.   HPI [name] is a 70 y.o. male.   Pt presents with Intermittent anterior and posterior left shoulder pain at 8/10 after falling 2-3 weeks ago on ice into a bush. Describes as burning and radiates into mid back. No numbness and tingling. Attempted use of tylenol with no relief. Pain disrupts sleep. Difficulty raising arm above head. No history of prior injury.   Concern of lethargy in triage, patient alert and oriented during exam   Past Medical History:  Diagnosis Date  . Aortic valve disorders   . Benign neoplasm of colon   . Degeneration of intervertebral disc, site unspecified   . Diabetes mellitus without complication ([place])   . 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 Esophageal reflux   . ESRD (end stage renal disease) on dialysis ([place])   . Essential hypertension   . Obstructive sleep apnea (adult) (pediatric)   . Osteoarthrosis, unspecified whether generalized or localized, unspecified site   . Other and unspecified hyperlipidemia   . Pneumonia    interstitial pneumonia  . Pulmonary fibrosis ([place])   . Renal disorder   . Respiratory failure with hypoxia ([place]) 12/2015  . Transplanted, lung ([place])   . Unspecified essential hypertension     Patient Active Problem List   Diagnosis Date Noted  . Acute bronchiolitis due to human metapneumovirus 04/25/2020  . Sepsis with acute organ dysfunction ([place]) 01/08/2020  . Lactic acidosis 01/08/2020  . Mixed diabetic hyperlipidemia associated with type 2 diabetes mellitus ([place]) 01/08/2020  . Hepatic cirrhosis ([place]) 01/08/2020  . Uncontrolled type 2 diabetes mellitus with hyperglycemia, with long-term current use of insulin ( [place]) 01/08/2020  . IV infiltrate, initial encounter 01/08/2020  . Acute diarrhea 01/08/2020  . Acute metabolic encephalopathy  52/77/8242  . 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 AV fistula infection, initial encounter ([place]) 01/14/2019  . Immunocompromised state ([place]) 01/14/2019  . Wound infection 01/13/2019  . Sepsis ([place])   . CKD (chronic kidney disease)   . Generalized weakness 12/30/2015  . End-stage renal disease on hemodialysis ([place])   . 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[place])   . Acute respiratory failure with hypoxemia ([place]) 04/18/2015  . Septic shock ([place]) 04/18/2015  . Acute encephalopathy 04/18/2015  . Acute respiratory failure with hypoxia ([place]) 04/18/2015  . Cardiac arrest ([place])   . HCAP (healthcare-associated pneumonia)   . Elevated rheumatoid factor 05/09/2014  . Essential hypertension 05/09/2014  . ILD (interstitial lung disease) ([place]) 05/09/2014  . Obstructive apnea 05/09/2014  . Lung nodule, solitary 04/18/2014  . Awaiting organ transplant 04/18/2014  . Acute on chronic respiratory failure with hypoxia ([place]) 08/15/2013  . Edema 07/05/2013  . Chronic respiratory failure ([place]) 03/03/2013  . Diabetes mellitus with complication ([place]) 35/36/1443  . Acute sinusitis 05/04/2011  . Cough 11/10/2010  . 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 Obstructive sleep apnea 10/09/2008  . GERD without esophagitis 10/09/2008  . HIATAL HERNIA 10/09/2008  . OSTEOARTHRITIS 10/09/2008    Past Surgical History:  Procedure Laterality Date  . COLONOSCOPY WITH PROPOFOL N/A 11/07/2017   Procedure: COLONOSCOPY WITH PROPOFOL;  Surgeon: Ronnette Juniper, MD;  Location: [facility];  Service: Gastroenterology;  Laterality: N/A;  . LIGATION OF ARTERIOVENOUS  FISTULA Left 01/14/2019   Procedure: LIGATION OF ARTERIOVENOUS  FISTULA;  Surgeon: Marty Heck, MD;  Location: [facility] OR;  Service: Vascular;  Laterality: Left;  . LUNG BIOPSY  2010  . LUNG TRANSPLANT, SINGLE Right   . POLYPECTOMY  11/07/2017   Procedure: POLYPECTOMY;  Surgeon: Ronnette Juniper, MD;  Location: [facility];  Service: Gastroenterology;;        Home Medications    Prior to Admission medications   Medication Sig Start Date End Date Taking? Authorizing Provider  ACCU-CHEK FASTCLIX LANCETS MISC As directed up to 4 times daily 12/16/15   [provider]  acetaminophen (TYLENOL) 500 MG tablet Take 500-1,000 mg by mouth every 6 (six) hours as needed for moderate pain or headache.     [provider]  Alpha Lipoic Acid 200 MG CAPS Take 200-400 mg by mouth See admin instructions. On Monday, Wednesday, Friday (dialysis days): take 2 capsules (400 mg) by mouth with breakfast and 1 capsule (200 mg) with supper; on Sunday, Tuesday, Thursday, Saturday (non-dialysis days): take 1 capsule (200 mg) three times daily with meals    [provider]  aspirin 81 MG tablet Take 81 mg by mouth daily with breakfast.     [provider]  azaTHIOprine (IMURAN) 50 MG tablet Take 50 mg by mouth See admin instructions. Take one tablet (50 mg) by mouth on Monday, Wednesday, Friday after dialysis    [provider]  calcium acetate (PHOSLO) 667 MG capsule Take 667 mg by mouth 3 (three) times daily before meals.     [provider]  cycloSPORINE modified (GENGRAF) 25 MG capsule Take 75-100 mg by mouth See admin instructions. Take 3 capsule (75 mg) by mouth daily with breakfast and 4 capsules (100 mg) at bedtime    [provider]  dorzolamidel-timolol (COSOPT PF) 22.3-6.8 MG/ML SOLN ophthalmic solution Place 1 drop into both eyes 2 (two) times daily.    [provider]  ferrous sulfate 325 (65 FE) MG tablet Take 325 mg by mouth See admin instructions. On Monday, Wednesday, Friday (dialysis days): take 1 tablet (325 mg) by mouth daily with breakfast; on Sunday, Tuesday, Thursday, Saturday (non-dialysis days): take 1 tablet (325 mg) by mouth daily with lunch.    [provider]  fluorometholone (FML) 0.1 % ophthalmic suspension Place 1 drop into the right eye 4 (four) times daily.  04/11/20   [provider]  fluticasone (FLONASE) 50 MCG/ACT nasal spray Place 2 sprays into the nose daily. Patient taking differently: Place 2 sprays into the nose daily as needed for allergies.  03/05/13   Wenda Low, MD  Immune Globulin 10% (PRIVIGEN) 20 GM/200ML SOLN Inject 0.5 g/kg into the vein every 3 (three) months.     [provider]  insulin regular (NOVOLIN R) 100 units/mL injection Inject 5-9 Units into the skin See admin instructions. Inject 5 units subcutaneously with breakfast and 6 units with lunch and supper; PLUS adjustment per sliding scale: CBG [id number] add 1 unit, 261-360 add 2 units, 361-461 add 3 units    [provider]  midodrine (PROAMATINE) 5 MG tablet Take 5-10 mg by mouth See admin instructions. On Monday, Wednesday, Friday(dialysis days): take 2 tablets (10 mg) by mouth before dialysis, take 1 tablet (5 mg) after dialysis as needed for SBP >90. On Sunday, Tuesday, Thursday, Saturday (non-dialysis days): take 1 tablet (5 mg) by mouth 3 times daily as needed for SBP >90. 11/18/19   [provider]  Multiple Vitamin (MULTIVITAMIN WITH MINERALS) TABS tablet Take 1 tablet by mouth daily. Centrum for Men 50 plus: on  Monday, Wednesday, Friday (dialysis days): take one tablet daily with breakfast; on Sunday, Tuesday, Thursday, Saturday: take one tablet daily with supper    [provider]  omeprazole (PRILOSEC) 40 MG capsule Take 40 mg by mouth 2 (two) times daily with a meal.  11/12/18   [provider]  polyvinyl alcohol-povidone (REFRESH) 1.4-0.6 % ophthalmic solution Place 1 drop into both eyes at bedtime.     [provider]  pravastatin (PRAVACHOL) 10 MG tablet Take 10 mg by mouth at bedtime. 03/16/20   [provider]  predniSONE (DELTASONE) 5 MG tablet Take 5 mg by mouth daily with breakfast.    [provider]  sulfamethoxazole-trimethoprim (BACTRIM,SEPTRA) 400-80 MG tablet Take 1 tablet by  mouth See admin instructions. On Monday, Wednesday, Friday (dialysis days): take one tablet by mouth daily with supper 02/27/17   [provider]  valGANciclovir (VALCYTE) 450 MG tablet Take 450 mg by mouth See admin instructions. Take one tablet (450 mg) by mouth on Monday and Friday after dialysis 03/26/15   [provider]  vitamin B-6 (PYRIDOXINE) 25 MG tablet Take 25 mg by mouth daily with breakfast.     [provider]    Family History Family History  Problem Relation Age of Onset  . Pancreatic cancer Brother   . Heart disease Father     Social History Social History   Tobacco Use  . Smoking status: Former Smoker    Packs/day: 0.30    Years: 20.00    Pack years: 6.00    Types: Cigarettes    Quit date: 06/06/2002    Years since quitting: 18.0  . Smokeless tobacco: Never Used  Vaping Use  . Vaping Use: Never used  Substance Use Topics  . Alcohol use: No    Alcohol/week: 0.0 standard drinks  . Drug use: No     Allergies   Levofloxacin and Nsaids   Review of Systems Review of Systems  Constitutional: Negative.   Musculoskeletal: Positive for back pain. Negative for arthralgias, gait problem, joint swelling, myalgias, neck pain and neck stiffness.       Positive left shoulder pain   Skin: Negative.   Neurological: Negative.   Psychiatric/Behavioral: Negative.      Physical Exam Triage Vital Signs ED Triage Vitals  Enc Vitals Group     BP 07/06/20 1413 104/68     Pulse Rate 07/06/20 1413 86     Resp 07/06/20 1413 19     Temp --      Temp src --      SpO2 07/06/20 1413 98 %     Weight --      Height --      Head Circumference --      Peak Flow --      Pain Score 07/06/20 1416 9     Pain Loc --      Pain Edu? --      Excl. in [place]? --    No data found.  Updated Vital Signs BP 104/68 (BP Location: Right Arm)   Pulse 86   Resp 19   SpO2 98%   Visual Acuity Right Eye Distance:   Left Eye Distance:   Bilateral Distance:     Right Eye Near:   Left Eye Near:    Bilateral Near:     Physical Exam Constitutional:      Appearance: Normal appearance. He is normal weight.  HENT:     Head: Normocephalic.  Eyes:  Extraocular Movements: Extraocular movements intact.  Pulmonary:     Effort: Pulmonary effort is normal.  Musculoskeletal:        General: No swelling, deformity or signs of injury.     Cervical back: Normal range of motion.     Comments: Positive tenderness over left trapezius, posterior of deltoid, upper latissimus dorsi and upper left pectoralis major, abduction of left arm limited to 90 degrees, remaining ROM intact with mild pain elicited    Skin:    General: Skin is warm and dry.  Neurological:     General: No focal deficit present.     Mental Status: He is alert and oriented to person, place, and time. Mental status is at baseline.     Sensory: No sensory deficit.     Motor: No weakness.     Gait: Gait normal.  Psychiatric:        Mood and Affect: Mood normal.        Behavior: Behavior normal.        Thought Content: Thought content normal.        Judgment: Judgment normal.      UC Treatments / Results  Labs (all labs ordered are listed, but only abnormal results are displayed) Labs Reviewed - No data to display  EKG   Radiology No results found.  Procedures Procedures (including critical care time)  Medications Ordered in UC Medications - No data to display  Initial Impression / Assessment and Plan / UC Course  I have reviewed the triage vital signs and the nursing notes.  Pertinent labs & imaging results that were available during my care of the patient were reviewed by me and considered in my medical decision making (see chart for details).    Acute left shoulder pain  I believe this pain to be musculoskeletal in nature. Patient ordered Voltaren gel for daytime and muscle relaxer at bedtime to provide comfort. Recommended use of heat as needed for symptom  relief. Encouraged to follow up with PCP for evaluation of physical therapy or further management if pain worsens.   Final Clinical Impressions(s) / UC Diagnoses   Final diagnoses:  None   Discharge Instructions   None    ED Prescriptions    None     PDMP not reviewed this encounter.   Hans Eden, NP 07/06/20 1524

## 2020-07-07 DIAGNOSIS — Z992 Dependence on renal dialysis: Secondary | ICD-10-CM | POA: Diagnosis not present

## 2020-07-07 DIAGNOSIS — T86819 Unspecified complication of lung transplant: Secondary | ICD-10-CM | POA: Diagnosis not present

## 2020-07-07 DIAGNOSIS — N186 End stage renal disease: Secondary | ICD-10-CM | POA: Diagnosis not present

## 2020-07-08 DIAGNOSIS — N2581 Secondary hyperparathyroidism of renal origin: Secondary | ICD-10-CM | POA: Diagnosis not present

## 2020-07-08 DIAGNOSIS — T8579XA Infection and inflammatory reaction due to other internal prosthetic devices, implants and grafts, initial encounter: Secondary | ICD-10-CM | POA: Diagnosis not present

## 2020-07-08 DIAGNOSIS — N186 End stage renal disease: Secondary | ICD-10-CM | POA: Diagnosis not present

## 2020-07-08 DIAGNOSIS — Z992 Dependence on renal dialysis: Secondary | ICD-10-CM | POA: Diagnosis not present

## 2020-07-08 DIAGNOSIS — E876 Hypokalemia: Secondary | ICD-10-CM | POA: Diagnosis not present

## 2020-07-10 DIAGNOSIS — T8579XA Infection and inflammatory reaction due to other internal prosthetic devices, implants and grafts, initial encounter: Secondary | ICD-10-CM | POA: Diagnosis not present

## 2020-07-10 DIAGNOSIS — N2581 Secondary hyperparathyroidism of renal origin: Secondary | ICD-10-CM | POA: Diagnosis not present

## 2020-07-10 DIAGNOSIS — E876 Hypokalemia: Secondary | ICD-10-CM | POA: Diagnosis not present

## 2020-07-10 DIAGNOSIS — Z992 Dependence on renal dialysis: Secondary | ICD-10-CM | POA: Diagnosis not present

## 2020-07-10 DIAGNOSIS — N186 End stage renal disease: Secondary | ICD-10-CM | POA: Diagnosis not present

## 2020-07-13 DIAGNOSIS — Z992 Dependence on renal dialysis: Secondary | ICD-10-CM | POA: Diagnosis not present

## 2020-07-13 DIAGNOSIS — E876 Hypokalemia: Secondary | ICD-10-CM | POA: Diagnosis not present

## 2020-07-13 DIAGNOSIS — T8579XA Infection and inflammatory reaction due to other internal prosthetic devices, implants and grafts, initial encounter: Secondary | ICD-10-CM | POA: Diagnosis not present

## 2020-07-13 DIAGNOSIS — N2581 Secondary hyperparathyroidism of renal origin: Secondary | ICD-10-CM | POA: Diagnosis not present

## 2020-07-13 DIAGNOSIS — N186 End stage renal disease: Secondary | ICD-10-CM | POA: Diagnosis not present

## 2020-07-15 ENCOUNTER — Other Ambulatory Visit: Payer: Self-pay

## 2020-07-15 ENCOUNTER — Emergency Department (HOSPITAL_COMMUNITY): Payer: Medicare Other

## 2020-07-15 ENCOUNTER — Encounter (HOSPITAL_COMMUNITY): Payer: Self-pay | Admitting: Emergency Medicine

## 2020-07-15 ENCOUNTER — Emergency Department (HOSPITAL_COMMUNITY)
Admission: EM | Admit: 2020-07-15 | Discharge: 2020-07-15 | Disposition: A | Discharge status: Home / Self Care | Payer: Medicare Other | Source: Home / Self Care | Attending: Emergency Medicine | Admitting: Emergency Medicine

## 2020-07-15 DIAGNOSIS — J984 Other disorders of lung: Secondary | ICD-10-CM | POA: Diagnosis not present

## 2020-07-15 DIAGNOSIS — E1122 Type 2 diabetes mellitus with diabetic chronic kidney disease: Secondary | ICD-10-CM | POA: Insufficient documentation

## 2020-07-15 DIAGNOSIS — Z87891 Personal history of nicotine dependence: Secondary | ICD-10-CM | POA: Insufficient documentation

## 2020-07-15 DIAGNOSIS — J8410 Pulmonary fibrosis, unspecified: Secondary | ICD-10-CM | POA: Diagnosis not present

## 2020-07-15 DIAGNOSIS — J90 Pleural effusion, not elsewhere classified: Secondary | ICD-10-CM | POA: Diagnosis not present

## 2020-07-15 DIAGNOSIS — Z20822 Contact with and (suspected) exposure to covid-19: Secondary | ICD-10-CM | POA: Insufficient documentation

## 2020-07-15 DIAGNOSIS — T8579XA Infection and inflammatory reaction due to other internal prosthetic devices, implants and grafts, initial encounter: Secondary | ICD-10-CM | POA: Diagnosis not present

## 2020-07-15 DIAGNOSIS — R0789 Other chest pain: Secondary | ICD-10-CM

## 2020-07-15 DIAGNOSIS — Z7982 Long term (current) use of aspirin: Secondary | ICD-10-CM | POA: Insufficient documentation

## 2020-07-15 DIAGNOSIS — J849 Interstitial pulmonary disease, unspecified: Secondary | ICD-10-CM | POA: Diagnosis not present

## 2020-07-15 DIAGNOSIS — N186 End stage renal disease: Secondary | ICD-10-CM | POA: Insufficient documentation

## 2020-07-15 DIAGNOSIS — Z794 Long term (current) use of insulin: Secondary | ICD-10-CM | POA: Insufficient documentation

## 2020-07-15 DIAGNOSIS — Z86010 Personal history of colonic polyps: Secondary | ICD-10-CM | POA: Insufficient documentation

## 2020-07-15 DIAGNOSIS — I120 Hypertensive chronic kidney disease with stage 5 chronic kidney disease or end stage renal disease: Secondary | ICD-10-CM | POA: Insufficient documentation

## 2020-07-15 DIAGNOSIS — R918 Other nonspecific abnormal finding of lung field: Secondary | ICD-10-CM | POA: Diagnosis not present

## 2020-07-15 DIAGNOSIS — Z942 Lung transplant status: Secondary | ICD-10-CM | POA: Diagnosis not present

## 2020-07-15 DIAGNOSIS — Z79899 Other long term (current) drug therapy: Secondary | ICD-10-CM | POA: Insufficient documentation

## 2020-07-15 DIAGNOSIS — R0602 Shortness of breath: Secondary | ICD-10-CM | POA: Diagnosis not present

## 2020-07-15 DIAGNOSIS — Z992 Dependence on renal dialysis: Secondary | ICD-10-CM | POA: Insufficient documentation

## 2020-07-15 DIAGNOSIS — J432 Centrilobular emphysema: Secondary | ICD-10-CM | POA: Diagnosis not present

## 2020-07-15 DIAGNOSIS — N2581 Secondary hyperparathyroidism of renal origin: Secondary | ICD-10-CM | POA: Diagnosis not present

## 2020-07-15 DIAGNOSIS — J9811 Atelectasis: Secondary | ICD-10-CM | POA: Diagnosis not present

## 2020-07-15 DIAGNOSIS — R069 Unspecified abnormalities of breathing: Secondary | ICD-10-CM | POA: Diagnosis not present

## 2020-07-15 DIAGNOSIS — E876 Hypokalemia: Secondary | ICD-10-CM | POA: Diagnosis not present

## 2020-07-15 DIAGNOSIS — J851 Abscess of lung with pneumonia: Secondary | ICD-10-CM | POA: Diagnosis not present

## 2020-07-15 LAB — BASIC METABOLIC PANEL
Anion gap: 19 — ABNORMAL HIGH (ref 5–15)
BUN: 8 mg/dL (ref 8–23)
CO2: 23 mmol/L (ref 22–32)
Calcium: 8.9 mg/dL (ref 8.9–10.3)
Chloride: 95 mmol/L — ABNORMAL LOW (ref 98–111)
Creatinine, Ser: 3.89 mg/dL — ABNORMAL HIGH (ref 0.61–1.24)
GFR, Estimated: 16 mL/min — ABNORMAL LOW (ref >60)
Glucose, Bld: 144 mg/dL — ABNORMAL HIGH (ref 70–99)
Potassium: 4.4 mmol/L (ref 3.5–5.1)
Sodium: 137 mmol/L (ref 135–145)

## 2020-07-15 LAB — CBC
HCT: 40.9 % (ref 39.0–52.0)
Hemoglobin: 12.5 g/dL — ABNORMAL LOW (ref 13.0–17.0)
MCH: 34.3 pg — ABNORMAL HIGH (ref 26.0–34.0)
MCHC: 30.6 g/dL (ref 30.0–36.0)
MCV: 112.4 fL — ABNORMAL HIGH (ref 80.0–100.0)
Platelets: 168 10*3/uL (ref 150–400)
RBC: 3.64 MIL/uL — ABNORMAL LOW (ref 4.22–5.81)
RDW: 15.6 % — ABNORMAL HIGH (ref 11.5–15.5)
WBC: 3.4 10*3/uL — ABNORMAL LOW (ref 4.0–10.5)
nRBC: 0 % (ref 0.0–0.2)

## 2020-07-15 LAB — RESP PANEL BY RT-PCR (FLU A&B, COVID) ARPGX2
Influenza A by PCR: NEGATIVE
Influenza B by PCR: NEGATIVE
SARS Coronavirus 2 by RT PCR: NEGATIVE

## 2020-07-15 LAB — TROPONIN I (HIGH SENSITIVITY)
Troponin I (High Sensitivity): 10 ng/L (ref <18)
Troponin I (High Sensitivity): 10 ng/L (ref <18)

## 2020-07-15 MED ORDER — OXYCODONE-ACETAMINOPHEN 5-325 MG PO TABS: 1.0000 | ORAL_TABLET | Freq: Four times a day (QID) | ORAL | 0 refills | Status: DC | PRN

## 2020-07-15 NOTE — ED Provider Notes (Signed)
[facility]GENCY DEPARTMENT Provider Note   CSN: [id number] Arrival date & time: 07/15/20  1323     History Chief Complaint  Patient presents with  . Chest Pain    [name] is a 70 y.o. male.  The history is provided by the patient and medical records. No language interpreter was used.  Chest Pain    70 year old male significant history of pulmonary fibrosis, diabetes, hypertension, end-stage renal disease currently on dialysis, presenting for evaluation of chest wall pain.  Patient report approximately a week and a half ago he tripped and fell walking on slippery ice.  He landed on his left side of chest.  Since then he has had recurrent pain primarily to the left side of chest radiates towards his back with associated shortness of breath.  Pain is sharp, shooting, intermittent, waxing waning.  No associated fever chills no productive cough, hemoptysis, exertional chest pain diaphoresis or lightheadedness.  He has been vaccinated for COVID-19.  He was seen at urgent care center for his complaint.  States he was told that he has a pulled muscle and was given anti-inflammatory medication, muscle relaxant, and some types of cream.  He reports some improvement with the symptoms while using the medication but symptoms still persist.  He had his dialysis session today.  He is here due to ongoing chest pain.  He denies any significant cardiac history.  Past Medical History:  Diagnosis Date  . Aortic valve disorders   . Benign neoplasm of colon   . Degeneration of intervertebral disc, site unspecified   . Diabetes mellitus without complication ([place])   . 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 Esophageal reflux   . ESRD (end stage renal disease) on dialysis ([place])   . Essential hypertension   . Obstructive sleep apnea (adult) (pediatric)   . Osteoarthrosis, unspecified whether generalized or localized, unspecified site   . Other and  unspecified hyperlipidemia   . Pneumonia    interstitial pneumonia  . Pulmonary fibrosis ([place])   . Renal disorder   . Respiratory failure with hypoxia ([place]) 12/2015  . Transplanted, lung ([place])   . Unspecified essential hypertension     Patient Active Problem List   Diagnosis Date Noted  . Acute bronchiolitis due to human metapneumovirus 04/25/2020  . Sepsis with acute organ dysfunction ([place]) 01/08/2020  . Lactic acidosis 01/08/2020  . Mixed diabetic hyperlipidemia associated with type 2 diabetes mellitus ([place]) 01/08/2020  . Hepatic cirrhosis ([place]) 01/08/2020  . Uncontrolled type 2 diabetes mellitus with hyperglycemia, with long-term current use of insulin ([place]) 01/08/2020  . IV infiltrate, initial encounter 01/08/2020  . Acute diarrhea 01/08/2020  . Acute metabolic encephalopathy 61/60/7371  . 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 AV fistula infection, initial encounter ([place]) 01/14/2019  . Immunocompromised state ([place]) 01/14/2019  . Wound infection 01/13/2019  . Sepsis ([place])   . CKD (chronic kidney disease)   . Generalized weakness 12/30/2015  . End-stage renal disease on hemodialysis ([place])   . 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 Acute respiratory failure with hypoxemia ([place]) 04/18/2015  . Septic shock ([place]) 04/18/2015  . Acute encephalopathy 04/18/2015  . Acute respiratory failure with hypoxia ([place]) 04/18/2015  . Cardiac arrest ([place])   . HCAP (healthcare-associated pneumonia)   . Elevated rheumatoid factor 05/09/2014  . Essential hypertension 05/09/2014  . ILD (interstitial lung disease) ([place]) 05/09/2014  . Obstructive apnea 05/09/2014  . Lung nodule, solitary 04/18/2014  .  Awaiting organ transplant 04/18/2014  . Acute on chronic respiratory failure with hypoxia ([place]) 08/15/2013  . Edema 07/05/2013  . Chronic respiratory failure ([place]) 03/03/2013  . Diabetes mellitus with complication ([place]) 41/32/4401  . Acute sinusitis 05/04/2011  . Cough 11/10/2010  .  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 Obstructive sleep apnea 10/09/2008  . GERD without esophagitis 10/09/2008  . HIATAL HERNIA 10/09/2008  . OSTEOARTHRITIS 10/09/2008    Past Surgical History:  Procedure Laterality Date  . COLONOSCOPY WITH PROPOFOL N/A 11/07/2017   Procedure: COLONOSCOPY WITH PROPOFOL;  Surgeon: Ronnette Juniper, MD;  Location: [facility];  Service: Gastroenterology;  Laterality: N/A;  . LIGATION OF ARTERIOVENOUS  FISTULA Left 01/14/2019   Procedure: LIGATION OF ARTERIOVENOUS  FISTULA;  Surgeon: Marty Heck, MD;  Location: [facility];  Service: Vascular;  Laterality: Left;  . LUNG BIOPSY  2010  . LUNG TRANSPLANT, SINGLE Right   . POLYPECTOMY  11/07/2017   Procedure: POLYPECTOMY;  Surgeon: Ronnette Juniper, MD;  Location: [facility];  Service: Gastroenterology;;       Family History  Problem Relation Age of Onset  . Pancreatic cancer Brother   . Heart disease Father     Social History   Tobacco Use  . Smoking status: Former Smoker    Packs/day: 0.30    Years: 20.00    Pack years: 6.00    Types: Cigarettes    Quit date: 06/06/2002    Years since quitting: 18.1  . Smokeless tobacco: Never Used  Vaping Use  . Vaping Use: Never used  Substance Use Topics  . Alcohol use: No    Alcohol/week: 0.0 standard drinks  . Drug use: No    Home Medications Prior to Admission medications   Medication Sig Start Date End Date Taking? Authorizing Provider  ACCU-CHEK FASTCLIX LANCETS MISC As directed up to 4 times daily 12/16/15   [provider]  acetaminophen (TYLENOL) 500 MG tablet Take 500-1,000 mg by mouth every 6 (six) hours as needed for moderate pain or headache.     [provider]  Alpha Lipoic Acid 200 MG CAPS Take 200-400 mg by mouth See admin instructions. On Monday, Wednesday, Friday (dialysis days): take 2 capsules (400 mg) by mouth with breakfast and 1 capsule (200 mg) with supper; on Sunday, Tuesday, Thursday, Saturday  (non-dialysis days): take 1 capsule (200 mg) three times daily with meals    [provider]  aspirin 81 MG tablet Take 81 mg by mouth daily with breakfast.     [provider]  azaTHIOprine (IMURAN) 50 MG tablet Take 50 mg by mouth See admin instructions. Take one tablet (50 mg) by mouth on Monday, Wednesday, Friday after dialysis    [provider]  calcium acetate (PHOSLO) 667 MG capsule Take 667 mg by mouth 3 (three) times daily before meals.     [provider]  cycloSPORINE modified (GENGRAF) 25 MG capsule Take 75-100 mg by mouth See admin instructions. Take 3 capsule (75 mg) by mouth daily with breakfast and 4 capsules (100 mg) at bedtime    [provider]  diclofenac Sodium (VOLTAREN) 1 % GEL Apply 2 g topically 4 (four) times daily. 07/06/20   White, Leitha Schuller, NP  dorzolamidel-timolol (COSOPT PF) 22.3-6.8 MG/ML SOLN ophthalmic solution Place 1 drop into both eyes 2 (two) times daily.    [provider]  ferrous sulfate 325 (65 FE) MG tablet Take 325 mg by mouth See admin instructions. On Monday, Wednesday, Friday (dialysis  days): take 1 tablet (325 mg) by mouth daily with breakfast; on Sunday, Tuesday, Thursday, Saturday (non-dialysis days): take 1 tablet (325 mg) by mouth daily with lunch.    [provider]  fluorometholone (FML) 0.1 % ophthalmic suspension Place 1 drop into the right eye 4 (four) times daily. 04/11/20   [provider]  fluticasone (FLONASE) 50 MCG/ACT nasal spray Place 2 sprays into the nose daily. Patient taking differently: Place 2 sprays into the nose daily as needed for allergies.  03/05/13   Wenda Low, MD  Immune Globulin 10% (PRIVIGEN) 20 GM/200ML SOLN Inject 0.5 g/kg into the vein every 3 (three) months.     [provider]  insulin regular (NOVOLIN R) 100 units/mL injection Inject 5-9 Units into the skin See admin instructions. Inject 5 units subcutaneously with breakfast and 6  units with lunch and supper; PLUS adjustment per sliding scale: CBG [phone] add 1 unit, 261-360 add 2 units, 361-461 add 3 units    [provider]  midodrine (PROAMATINE) 5 MG tablet Take 5-10 mg by mouth See admin instructions. On Monday, Wednesday, Friday(dialysis days): take 2 tablets (10 mg) by mouth before dialysis, take 1 tablet (5 mg) after dialysis as needed for SBP >90. On Sunday, Tuesday, Thursday, Saturday (non-dialysis days): take 1 tablet (5 mg) by mouth 3 times daily as needed for SBP >90. 11/18/19   [provider]  Multiple Vitamin (MULTIVITAMIN WITH MINERALS) TABS tablet Take 1 tablet by mouth daily. Centrum for Men 50 plus: on Monday, Wednesday, Friday (dialysis days): take one tablet daily with breakfast; on Sunday, Tuesday, Thursday, Saturday: take one tablet daily with supper    [provider]  omeprazole (PRILOSEC) 40 MG capsule Take 40 mg by mouth 2 (two) times daily with a meal.  11/12/18   [provider]  polyvinyl alcohol-povidone (REFRESH) 1.4-0.6 % ophthalmic solution Place 1 drop into both eyes at bedtime.     [provider]  pravastatin (PRAVACHOL) 10 MG tablet Take 10 mg by mouth at bedtime. 03/16/20   [provider]  predniSONE (DELTASONE) 5 MG tablet Take 5 mg by mouth daily with breakfast.    [provider]  sulfamethoxazole-trimethoprim (BACTRIM,SEPTRA) 400-80 MG tablet Take 1 tablet by mouth See admin instructions. On Monday, Wednesday, Friday (dialysis days): take one tablet by mouth daily with supper 02/27/17   [provider]  tizanidine (ZANAFLEX) 2 MG capsule Take 1 capsule (2 mg total) by mouth at bedtime as needed for muscle spasms. 07/06/20   Hans Eden, NP  valGANciclovir (VALCYTE) 450 MG tablet Take 450 mg by mouth See admin instructions. Take one tablet (450 mg) by mouth on Monday and Friday after dialysis 03/26/15   [provider]  vitamin B-6 (PYRIDOXINE) 25 MG tablet  Take 25 mg by mouth daily with breakfast.     [provider]    Allergies    Levofloxacin and Nsaids  Review of Systems   Review of Systems  Cardiovascular: Positive for chest pain.  All other systems reviewed and are negative.   Physical Exam Updated Vital Signs BP (!) 95/53 (BP Location: Right Arm)   Pulse 97   Temp 98.7 F (37.1 C) (Oral)   Resp (!) 26   SpO2 96%   Physical Exam Vitals and nursing note reviewed.  Constitutional:      General: He is not in acute distress.    Appearance: He is well-developed and well-nourished.  HENT:  Head: Atraumatic.  Eyes:     Conjunctiva/sclera: Conjunctivae normal.  Cardiovascular:     Rate and Rhythm: Normal rate and regular rhythm.     Heart sounds: Normal heart sounds.  Chest:     Chest wall: Tenderness (Exquisite tenderness to palpation of left chest wall without crepitus emphysema.  No rash noted.) present.  Musculoskeletal:     Cervical back: Neck supple.  Skin:    Findings: No rash.  Neurological:     Mental Status: He is alert.  Psychiatric:        Mood and Affect: Mood and affect normal.     ED Results / Procedures / Treatments   Labs (all labs ordered are listed, but only abnormal results are displayed) Labs Reviewed  BASIC METABOLIC PANEL - Abnormal; Notable for the following components:      Result Value   Chloride 95 (*)    Glucose, Bld 144 (*)    Creatinine, Ser 3.89 (*)    GFR, Estimated 16 (*)    Anion gap 19 (*)    All other components within normal limits  CBC - Abnormal; Notable for the following components:   WBC 3.4 (*)    RBC 3.64 (*)    Hemoglobin 12.5 (*)    MCV 112.4 (*)    MCH 34.3 (*)    RDW 15.6 (*)    All other components within normal limits  RESP PANEL BY RT-PCR (FLU A&B, COVID) ARPGX2  POC SARS CORONAVIRUS 2 AG -  ED  TROPONIN I (HIGH SENSITIVITY)  TROPONIN I (HIGH SENSITIVITY)    EKG EKG Interpretation  Date/Time:  Wednesday July 15 2020 13:49:52  EST Ventricular Rate:  99 PR Interval:  136 QRS Duration: 124 QT Interval:  406 QTC Calculation: 521 R Axis:   -7 Text Interpretation: Normal sinus rhythm Right bundle branch block No significant change since last tracing Confirmed by Blanchie Dessert ([id number]) on 07/15/2020 3:28:28 PM   Radiology CT Chest Wo Contrast  Result Date: 07/15/2020 CLINICAL DATA:  Fall, left rib pain with left rib fracture suspected. History of lung transplant. EXAM: CT CHEST WITHOUT CONTRAST TECHNIQUE: Multidetector CT imaging of the chest was performed following the standard protocol without IV contrast. COMPARISON:  Chest radiograph from earlier today. 01/08/2020 chest CT angiogram. FINDINGS: Cardiovascular: Normal heart size. No significant pericardial fluid/thickening. Three-vessel coronary atherosclerosis. Atherosclerotic nonaneurysmal thoracic aorta. Top-normal caliber main pulmonary artery (3.3 cm diameter). No evidence of acute thoracic aortic intramural hematoma. Left brachiocephalic vein stent is stable in position. Left cephalic vein stent is in place. Mediastinum/Nodes: No pneumomediastinum. No mediastinal hematoma. No discrete thyroid nodules. Unremarkable esophagus. No axillary, mediastinal or hilar lymphadenopathy. Lungs/Pleura: No pneumothorax. No pleural effusion. Moderate centrilobular and paraseptal emphysema in the left lung. Asymmetric prominent volume loss in the left lung, unchanged. Stable thin linear scar in the right lower lobe. New masslike consolidation in the posterior apical left upper lobe measuring 3.9 x 3.6 cm (series 5/image 23). Extensive patchy confluent reticulation and ground-glass opacity throughout the left lung with associated prominent traction bronchiectasis and architectural distortion, unchanged, compatible with band. Fibrotic interstitial lung disease. No additional significant pulmonary nodules. Upper Abdomen: Cholelithiasis. Diffusely mildly irregular liver surface suggesting  cirrhosis. Nonobstructing upper right renal stones, largest 3 mm. Musculoskeletal: Subtle erosive change along the anterior surface of the posterior left third rib (series 5/image 20). No fracture detected in the chest. Mild thoracic spondylosis. IMPRESSION: 1. New masslike consolidation in the posterior apical left upper lobe measuring 3.9  x 3.6 cm, cannot exclude primary bronchogenic carcinoma. PET-CT suggested for further evaluation. 2. Subtle erosive change along the anterior surface of the posterior left third rib, cannot exclude direct tumor involvement. No discrete bone fracture in the chest. 3. 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 No acute pulmonary disease in the right lung transplant. 4. Cholelithiasis. 5. Morphologic changes in the liver suggestive of cirrhosis. 6. Nonobstructing right nephrolithiasis. 7. Aortic Atherosclerosis (ICD10-I70.0) and Emphysema (ICD10-J43.9). Electronically Signed   By: Ilona Sorrel M.D.   On: 07/15/2020 17:31   DG Chest Portable 1 View  Result Date: 07/15/2020 CLINICAL DATA:  Shortness of breath. History of lung transplant on the right EXAM: PORTABLE CHEST 1 VIEW COMPARISON:  April 24, 2020 FINDINGS: Postoperative change on the right consistent with previous lung transplant. There is a small right pleural effusion. There is slight atelectasis in the right base. The right lung is otherwise clear. There is volume loss on the left with widespread fibrosis throughout the left lung, stable. Heart size is normal. Stable pulmonary vascularity. Stable stents on the left. No appreciable adenopathy. There is aortic atherosclerosis. No bone lesions. IMPRESSION: Volume loss left lung with extensive fibrosis, grossly stable. Right lung transplant clear except for small right pleural effusion and mild right lower lobe atelectasis. Postoperative changes noted on the right. Stable cardiac silhouette. Stents noted at several sites on the left. No adenopathy  appreciable. Aortic Atherosclerosis (ICD10-I70.0). Electronically Signed   By: Lowella Grip III M.D.   On: 07/15/2020 14:45    Procedures Procedures   Medications Ordered in ED Medications - No data to display  ED Course  I have reviewed the triage vital signs and the nursing notes.  Pertinent labs & imaging results that were available during my care of the patient were reviewed by me and considered in my medical decision making (see chart for details).    MDM Rules/Calculators/A&P                          BP 109/64   Pulse 87   Temp 98.7 F (37.1 C) (Oral)   Resp 15   SpO2 98%   Final Clinical Impression(s) / ED Diagnoses Final diagnoses:  Mass of left lung  Chest wall pain    Rx / DC Orders ED Discharge Orders         Ordered    oxyCODONE-acetaminophen (PERCOCET) 5-325 MG tablet  Every 6 hours PRN        07/15/20 1906         6:41 PM Patient here with ongoing left-sided chest pain for more than a week.  He report falling on eyes which may have caused this pain.  Pain is reproducible on exam.  Initial x-ray of his chestShows volume loss in the left lung with extensive fibrosis that looked grossly stable.  He has right lung transplant.  Given the severity of his pain while palpate I was concerned of a missed rib fracture.  I did order a chest CT scan for thorough evaluation.  CT shows a new masslike consolidations of the posterior apical left upper lobe measuring 3.9 x 3.6 cm, cannot exclude primary bronchogenic carcinoma.  PET/CT is suggested.  Also subtle erosive change along the anterior surface of the posterior left third ribs, cannot exclude direct tumor involvement.  No discrete bone fracture in the chest.    This finding is concerning for potential primary bronchogenic carcinoma.  I discussed  this finding with patient.  Since he is not hypoxic and he does have a PCP for close follow-up I encourage patient to discuss with his PCP for outpatient evaluation which  likely include PET scan as well as oncology follow-up.  Referral given.  Return precaution discussed.  Patient voiced understanding and agrees with plan.   Domenic Moras, PA-C 07/16/20 1901    Blanchie Dessert, MD 07/20/20 1753

## 2020-07-15 NOTE — ED Triage Notes (Signed)
Patient here for evaluation of worsening chest pain and shortness of breath that started last Wednesday. Patient was seen at Urgent Care for same, diagnosed as musculoskeletal shoulder pain and was given a cream to treat. Patient states pain and shortness of breath suddenly became worse today after completing dialysis. SpO2 on room air 96%.

## 2020-07-15 NOTE — Discharge Instructions (Addendum)
You have been evaluated for your chest pain.  Unfortunately your CT scan shows a mass to the left side of your chest concerning for potential cancer.  Please call and follow-up closely with your primary care doctor for further evaluation.  You would likely benefit from a PET scan for further evaluation as well as an oncology evaluation.  Take pain medication as prescribed.  Return if you have any concern

## 2020-07-15 NOTE — ED Notes (Signed)
Tried to obtain pt's blood for troponin, but was unsuccessful. I notified phlebotomy that I would need them to try and they said they would

## 2020-07-15 NOTE — ED Notes (Signed)
Patient verbalized understanding of discharge instructions. Opportunity for questions and answers.  

## 2020-07-17 ENCOUNTER — Encounter (HOSPITAL_COMMUNITY): Payer: Self-pay

## 2020-07-17 ENCOUNTER — Inpatient Hospital Stay (HOSPITAL_COMMUNITY)
Admission: EM | Admit: 2020-07-17 | Discharge: 2020-07-21 | DRG: 177 | Disposition: A | Discharge status: Home / Self Care | Payer: Medicare Other | Source: Ambulatory Visit | Attending: Internal Medicine | Admitting: Internal Medicine

## 2020-07-17 ENCOUNTER — Other Ambulatory Visit: Payer: Self-pay

## 2020-07-17 DIAGNOSIS — Z881 Allergy status to other antibiotic agents status: Secondary | ICD-10-CM

## 2020-07-17 DIAGNOSIS — E118 Type 2 diabetes mellitus with unspecified complications: Secondary | ICD-10-CM | POA: Diagnosis not present

## 2020-07-17 DIAGNOSIS — Z79899 Other long term (current) drug therapy: Secondary | ICD-10-CM

## 2020-07-17 DIAGNOSIS — D631 Anemia in chronic kidney disease: Secondary | ICD-10-CM | POA: Diagnosis present

## 2020-07-17 DIAGNOSIS — E785 Hyperlipidemia, unspecified: Secondary | ICD-10-CM | POA: Diagnosis present

## 2020-07-17 DIAGNOSIS — Z0184 Encounter for antibody response examination: Secondary | ICD-10-CM

## 2020-07-17 DIAGNOSIS — J849 Interstitial pulmonary disease, unspecified: Secondary | ICD-10-CM | POA: Diagnosis present

## 2020-07-17 DIAGNOSIS — Z8249 Family history of ischemic heart disease and other diseases of the circulatory system: Secondary | ICD-10-CM

## 2020-07-17 DIAGNOSIS — J189 Pneumonia, unspecified organism: Secondary | ICD-10-CM | POA: Diagnosis present

## 2020-07-17 DIAGNOSIS — R0689 Other abnormalities of breathing: Secondary | ICD-10-CM | POA: Diagnosis not present

## 2020-07-17 DIAGNOSIS — J851 Abscess of lung with pneumonia: Principal | ICD-10-CM | POA: Diagnosis present

## 2020-07-17 DIAGNOSIS — Z942 Lung transplant status: Secondary | ICD-10-CM | POA: Diagnosis not present

## 2020-07-17 DIAGNOSIS — R52 Pain, unspecified: Secondary | ICD-10-CM | POA: Diagnosis not present

## 2020-07-17 DIAGNOSIS — R9389 Abnormal findings on diagnostic imaging of other specified body structures: Secondary | ICD-10-CM

## 2020-07-17 DIAGNOSIS — N2581 Secondary hyperparathyroidism of renal origin: Secondary | ICD-10-CM | POA: Diagnosis not present

## 2020-07-17 DIAGNOSIS — I120 Hypertensive chronic kidney disease with stage 5 chronic kidney disease or end stage renal disease: Secondary | ICD-10-CM | POA: Diagnosis present

## 2020-07-17 DIAGNOSIS — Z992 Dependence on renal dialysis: Secondary | ICD-10-CM | POA: Diagnosis not present

## 2020-07-17 DIAGNOSIS — Z86010 Personal history of colonic polyps: Secondary | ICD-10-CM

## 2020-07-17 DIAGNOSIS — R0902 Hypoxemia: Secondary | ICD-10-CM

## 2020-07-17 DIAGNOSIS — Z7952 Long term (current) use of systemic steroids: Secondary | ICD-10-CM

## 2020-07-17 DIAGNOSIS — I082 Rheumatic disorders of both aortic and tricuspid valves: Secondary | ICD-10-CM | POA: Diagnosis present

## 2020-07-17 DIAGNOSIS — T8579XA Infection and inflammatory reaction due to other internal prosthetic devices, implants and grafts, initial encounter: Secondary | ICD-10-CM | POA: Diagnosis not present

## 2020-07-17 DIAGNOSIS — Z888 Allergy status to other drugs, medicaments and biological substances status: Secondary | ICD-10-CM

## 2020-07-17 DIAGNOSIS — R918 Other nonspecific abnormal finding of lung field: Secondary | ICD-10-CM | POA: Diagnosis not present

## 2020-07-17 DIAGNOSIS — Z87891 Personal history of nicotine dependence: Secondary | ICD-10-CM

## 2020-07-17 DIAGNOSIS — C3412 Malignant neoplasm of upper lobe, left bronchus or lung: Secondary | ICD-10-CM | POA: Diagnosis present

## 2020-07-17 DIAGNOSIS — D849 Immunodeficiency, unspecified: Secondary | ICD-10-CM | POA: Diagnosis present

## 2020-07-17 DIAGNOSIS — Z20822 Contact with and (suspected) exposure to covid-19: Secondary | ICD-10-CM | POA: Diagnosis present

## 2020-07-17 DIAGNOSIS — J9600 Acute respiratory failure, unspecified whether with hypoxia or hypercapnia: Secondary | ICD-10-CM | POA: Diagnosis not present

## 2020-07-17 DIAGNOSIS — Z9181 History of falling: Secondary | ICD-10-CM

## 2020-07-17 DIAGNOSIS — Z800 Family history of malignant neoplasm of digestive organs: Secondary | ICD-10-CM

## 2020-07-17 DIAGNOSIS — E1122 Type 2 diabetes mellitus with diabetic chronic kidney disease: Secondary | ICD-10-CM | POA: Diagnosis present

## 2020-07-17 DIAGNOSIS — G4733 Obstructive sleep apnea (adult) (pediatric): Secondary | ICD-10-CM | POA: Diagnosis present

## 2020-07-17 DIAGNOSIS — N186 End stage renal disease: Secondary | ICD-10-CM | POA: Diagnosis not present

## 2020-07-17 DIAGNOSIS — I270 Primary pulmonary hypertension: Secondary | ICD-10-CM | POA: Diagnosis not present

## 2020-07-17 DIAGNOSIS — Z794 Long term (current) use of insulin: Secondary | ICD-10-CM

## 2020-07-17 DIAGNOSIS — Z7982 Long term (current) use of aspirin: Secondary | ICD-10-CM

## 2020-07-17 DIAGNOSIS — R069 Unspecified abnormalities of breathing: Secondary | ICD-10-CM

## 2020-07-17 DIAGNOSIS — E8889 Other specified metabolic disorders: Secondary | ICD-10-CM | POA: Diagnosis present

## 2020-07-17 DIAGNOSIS — E876 Hypokalemia: Secondary | ICD-10-CM | POA: Diagnosis not present

## 2020-07-17 DIAGNOSIS — I10 Essential (primary) hypertension: Secondary | ICD-10-CM | POA: Diagnosis present

## 2020-07-17 LAB — BASIC METABOLIC PANEL
Anion gap: 17 — ABNORMAL HIGH (ref 5–15)
BUN: 11 mg/dL (ref 8–23)
CO2: 26 mmol/L (ref 22–32)
Calcium: 8.6 mg/dL — ABNORMAL LOW (ref 8.9–10.3)
Chloride: 96 mmol/L — ABNORMAL LOW (ref 98–111)
Creatinine, Ser: 3.54 mg/dL — ABNORMAL HIGH (ref 0.61–1.24)
GFR, Estimated: 18 mL/min — ABNORMAL LOW (ref >60)
Glucose, Bld: 128 mg/dL — ABNORMAL HIGH (ref 70–99)
Potassium: 4 mmol/L (ref 3.5–5.1)
Sodium: 139 mmol/L (ref 135–145)

## 2020-07-17 LAB — TROPONIN I (HIGH SENSITIVITY): Troponin I (High Sensitivity): 12 ng/L (ref <18)

## 2020-07-17 LAB — CBC
HCT: 38.2 % — ABNORMAL LOW (ref 39.0–52.0)
Hemoglobin: 12.3 g/dL — ABNORMAL LOW (ref 13.0–17.0)
MCH: 34.7 pg — ABNORMAL HIGH (ref 26.0–34.0)
MCHC: 32.2 g/dL (ref 30.0–36.0)
MCV: 107.9 fL — ABNORMAL HIGH (ref 80.0–100.0)
Platelets: 223 10*3/uL (ref 150–400)
RBC: 3.54 MIL/uL — ABNORMAL LOW (ref 4.22–5.81)
RDW: 15.3 % (ref 11.5–15.5)
WBC: 3.8 10*3/uL — ABNORMAL LOW (ref 4.0–10.5)
nRBC: 0 % (ref 0.0–0.2)

## 2020-07-17 LAB — PROCALCITONIN: Procalcitonin: 1.13 ng/mL

## 2020-07-17 LAB — BRAIN NATRIURETIC PEPTIDE: B Natriuretic Peptide: 272 pg/mL — ABNORMAL HIGH (ref 0.0–100.0)

## 2020-07-17 MED ORDER — PREDNISONE 5 MG PO TABS: 5.0000 mg | ORAL_TABLET | Freq: Every day | ORAL | Status: DC

## 2020-07-17 MED ORDER — OXYCODONE-ACETAMINOPHEN 5-325 MG PO TABS
1.0000 | ORAL_TABLET | Freq: Four times a day (QID) | ORAL | Status: DC | PRN
Start: 2020-07-17 — End: 2020-07-21
  Administered 2020-07-18 – 2020-07-19 (×4): 1 via ORAL
  Filled 2020-07-17 (×6): qty 1

## 2020-07-17 MED ORDER — ACETAMINOPHEN 325 MG PO TABS: 650.0000 mg | ORAL_TABLET | Freq: Four times a day (QID) | ORAL | Status: DC | PRN

## 2020-07-17 MED ORDER — DORZOLAMIDE HCL-TIMOLOL MAL 2-0.5 % OP SOLN
1.0000 [drp] | Freq: Two times a day (BID) | OPHTHALMIC | Status: DC
  Administered 2020-07-19 – 2020-07-21 (×5): 1 [drp] via OPHTHALMIC
  Filled 2020-07-17: qty 10

## 2020-07-17 MED ORDER — FERROUS SULFATE 325 (65 FE) MG PO TABS: 325.0000 mg | ORAL_TABLET | ORAL | Status: DC

## 2020-07-17 MED ORDER — ASPIRIN EC 81 MG PO TBEC
81.0000 mg | DELAYED_RELEASE_TABLET | Freq: Every day | ORAL | Status: DC
  Administered 2020-07-18 – 2020-07-21 (×4): 81 mg via ORAL
  Filled 2020-07-17 (×4): qty 1

## 2020-07-17 MED ORDER — AZATHIOPRINE 50 MG PO TABS
50.0000 mg | ORAL_TABLET | ORAL | Status: DC
  Administered 2020-07-18 – 2020-07-20 (×2): 50 mg via ORAL
  Filled 2020-07-17 (×2): qty 1

## 2020-07-17 MED ORDER — VALGANCICLOVIR HCL 450 MG PO TABS
450.0000 mg | ORAL_TABLET | ORAL | Status: DC
  Administered 2020-07-18 – 2020-07-20 (×2): 450 mg via ORAL
  Filled 2020-07-17: qty 1

## 2020-07-17 MED ORDER — PIPERACILLIN-TAZOBACTAM 3.375 G IVPB 30 MIN
3.3750 g | Freq: Once | INTRAVENOUS | Status: AC
  Administered 2020-07-17: 3.375 g via INTRAVENOUS
  Filled 2020-07-17: qty 50

## 2020-07-17 MED ORDER — CALCIUM ACETATE (PHOS BINDER) 667 MG PO CAPS
667.0000 mg | ORAL_CAPSULE | Freq: Three times a day (TID) | ORAL | Status: DC
  Administered 2020-07-18 – 2020-07-20 (×7): 667 mg via ORAL
  Filled 2020-07-17 (×8): qty 1

## 2020-07-17 MED ORDER — TIZANIDINE HCL 2 MG PO TABS
2.0000 mg | ORAL_TABLET | Freq: Every evening | ORAL | Status: DC | PRN
  Administered 2020-07-20: 2 mg via ORAL
  Filled 2020-07-17 (×2): qty 1

## 2020-07-17 MED ORDER — VITAMIN B-6 25 MG PO TABS
25.0000 mg | ORAL_TABLET | Freq: Every day | ORAL | Status: DC
  Administered 2020-07-18 – 2020-07-21 (×4): 25 mg via ORAL
  Filled 2020-07-17 (×4): qty 1

## 2020-07-17 MED ORDER — CYCLOSPORINE MODIFIED (NEORAL) 25 MG PO CAPS: 75.0000 mg | ORAL_CAPSULE | ORAL | Status: DC

## 2020-07-17 MED ORDER — ACETAMINOPHEN 650 MG RE SUPP: 650.0000 mg | Freq: Four times a day (QID) | RECTAL | Status: DC | PRN

## 2020-07-17 MED ORDER — PRAVASTATIN SODIUM 10 MG PO TABS
10.0000 mg | ORAL_TABLET | Freq: Every day | ORAL | Status: DC
  Administered 2020-07-18 – 2020-07-20 (×4): 10 mg via ORAL
  Filled 2020-07-17 (×4): qty 1

## 2020-07-17 MED ORDER — CYCLOSPORINE MODIFIED (NEORAL) 25 MG PO CAPS
75.0000 mg | ORAL_CAPSULE | Freq: Every day | ORAL | Status: DC
  Administered 2020-07-18 – 2020-07-21 (×4): 75 mg via ORAL
  Filled 2020-07-17 (×5): qty 3

## 2020-07-17 MED ORDER — VALGANCICLOVIR HCL 450 MG PO TABS
450.0000 mg | ORAL_TABLET | ORAL | Status: DC
Start: 2020-07-17 — End: 2020-07-17
  Filled 2020-07-17: qty 1

## 2020-07-17 MED ORDER — FERROUS SULFATE 325 (65 FE) MG PO TABS
325.0000 mg | ORAL_TABLET | ORAL | Status: DC
  Administered 2020-07-18 – 2020-07-21 (×3): 325 mg via ORAL
  Filled 2020-07-17 (×3): qty 1

## 2020-07-17 MED ORDER — PANTOPRAZOLE SODIUM 40 MG PO TBEC
40.0000 mg | DELAYED_RELEASE_TABLET | Freq: Every day | ORAL | Status: DC
  Administered 2020-07-18 – 2020-07-21 (×4): 40 mg via ORAL
  Filled 2020-07-17 (×4): qty 1

## 2020-07-17 MED ORDER — ALBUTEROL SULFATE HFA 108 (90 BASE) MCG/ACT IN AERS
1.0000 | INHALATION_SPRAY | RESPIRATORY_TRACT | Status: DC | PRN
  Filled 2020-07-17: qty 6.7

## 2020-07-17 MED ORDER — ALPHA LIPOIC ACID 200 MG PO CAPS: 200.0000 mg | ORAL_CAPSULE | ORAL | Status: DC

## 2020-07-17 MED ORDER — CYCLOSPORINE MODIFIED (NEORAL) 25 MG PO CAPS
100.0000 mg | ORAL_CAPSULE | Freq: Every day | ORAL | Status: DC
  Administered 2020-07-18 – 2020-07-20 (×4): 100 mg via ORAL
  Filled 2020-07-17 (×6): qty 4

## 2020-07-17 MED ORDER — MIDODRINE HCL 5 MG PO TABS: 5.0000 mg | ORAL_TABLET | Freq: Three times a day (TID) | ORAL | Status: DC | PRN

## 2020-07-17 MED ORDER — GUAIFENESIN ER 600 MG PO TB12
1200.0000 mg | ORAL_TABLET | Freq: Two times a day (BID) | ORAL | Status: DC
Start: 2020-07-17 — End: 2020-07-21
  Administered 2020-07-18 – 2020-07-21 (×8): 1200 mg via ORAL
  Filled 2020-07-17 (×8): qty 2

## 2020-07-17 MED ORDER — MIDODRINE HCL 5 MG PO TABS
10.0000 mg | ORAL_TABLET | ORAL | Status: DC
  Administered 2020-07-20: 10 mg via ORAL
  Filled 2020-07-17: qty 2

## 2020-07-17 MED ORDER — SULFAMETHOXAZOLE-TRIMETHOPRIM 400-80 MG PO TABS
1.0000 | ORAL_TABLET | ORAL | Status: DC
  Administered 2020-07-18 – 2020-07-20 (×2): 1 via ORAL
  Filled 2020-07-17 (×2): qty 1

## 2020-07-17 MED ORDER — HEPARIN SODIUM (PORCINE) 5000 UNIT/ML IJ SOLN
5000.0000 [IU] | Freq: Three times a day (TID) | INTRAMUSCULAR | Status: DC
  Administered 2020-07-18 – 2020-07-21 (×10): 5000 [IU] via SUBCUTANEOUS
  Filled 2020-07-17 (×11): qty 1

## 2020-07-17 MED ORDER — FERROUS SULFATE 325 (65 FE) MG PO TABS
325.0000 mg | ORAL_TABLET | ORAL | Status: DC
  Administered 2020-07-20: 325 mg via ORAL
  Filled 2020-07-17 (×2): qty 1

## 2020-07-17 MED ORDER — PIPERACILLIN-TAZOBACTAM IN DEX 2-0.25 GM/50ML IV SOLN
2.2500 g | Freq: Three times a day (TID) | INTRAVENOUS | Status: DC
  Filled 2020-07-17 (×2): qty 50

## 2020-07-17 NOTE — ED Provider Notes (Signed)
[facility] EMERGENCY DEPARTMENT Provider Note   CSN: [id number] Arrival date & time: 07/17/20  1235     History Chief Complaint  Patient presents with  . Shortness of Breath    [name] is a 70 y.o. 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  Patient reports he has had continued worsening dyspnea and weakness for 2 weeks.  He was seen in our Ed 2 days ago for dyspnea and chest pain in the setting of a mechanical fall.  Denies CP.   HPI     Past Medical History:  Diagnosis Date  . Aortic valve disorders   . Benign neoplasm of colon   . Degeneration of intervertebral disc, site unspecified   . Diabetes mellitus without complication ([place])   . 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 Esophageal reflux   . ESRD (end stage renal disease) on dialysis ([place])   . Essential hypertension   . Obstructive sleep apnea (adult) (pediatric)   . Osteoarthrosis, unspecified whether generalized or localized, unspecified site   . Other and unspecified hyperlipidemia   . Pneumonia    interstitial pneumonia  . Pulmonary fibrosis ([place])   . Renal disorder   . Respiratory failure with hypoxia ([place]) 12/2015  . Transplanted, lung ([place])   . Unspecified essential hypertension     Patient Active Problem List   Diagnosis Date Noted  . Acute bronchiolitis due to human metapneumovirus 04/25/2020  . Sepsis with acute organ dysfunction ([place]) 01/08/2020  . Lactic acidosis 01/08/2020  . Mixed diabetic hyperlipidemia associated with type 2 diabetes mellitus ([place]) 01/08/2020  . Hepatic cirrhosis ([place]) 01/08/2020  . Uncontrolled type 2 diabetes mellitus with hyperglycemia, with long-term current use of insulin ([place]) 01/08/2020  . IV infiltrate, initial encounter 01/08/2020  . Acute diarrhea 01/08/2020   . Acute metabolic encephalopathy 09/62/8366  . 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 AV fistula infection, initial encounter ([place]) 01/14/2019  . Immunocompromised state ([place]) 01/14/2019  . Wound infection 01/13/2019  . Sepsis ([place])   . CKD (chronic kidney disease)   . Generalized weakness 12/30/2015  . End-stage renal disease on hemodialysis ([place])   . 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[place])   . Acute respiratory failure with hypoxemia ([place]) 04/18/2015  . Septic shock ([place]) 04/18/2015  . Acute encephalopathy 04/18/2015  . Acute respiratory failure with hypoxia ([place]) 04/18/2015  . Cardiac arrest ([place])   . HCAP (healthcare-associated pneumonia)   . Elevated rheumatoid factor 05/09/2014  . Essential hypertension 05/09/2014  . ILD (interstitial lung disease) ([place]) 05/09/2014  . Obstructive apnea 05/09/2014  . Lung nodule, solitary 04/18/2014  . Awaiting organ transplant 04/18/2014  . Acute on chronic respiratory failure with hypoxia ([place]) 08/15/2013  . Edema 07/05/2013  . Chronic respiratory failure ([place]) 03/03/2013  . Diabetes mellitus with complication ([place]) 29/47/6546  . Acute sinusitis 05/04/2011  . Cough 11/10/2010  . 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 Obstructive sleep apnea 10/09/2008  . GERD without esophagitis 10/09/2008  . HIATAL HERNIA 10/09/2008  . OSTEOARTHRITIS 10/09/2008    Past Surgical History:  Procedure Laterality Date  . COLONOSCOPY WITH PROPOFOL N/A 11/07/2017   Procedure: COLONOSCOPY WITH PROPOFOL;  Surgeon: Ronnette Juniper, MD;  Location: [facility];  Service: Gastroenterology;  Laterality: N/A;  . LIGATION OF ARTERIOVENOUS  FISTULA Left 01/14/2019   Procedure: LIGATION OF ARTERIOVENOUS  FISTULA;  Surgeon: Marty Heck, MD;  Location: [facility];  Service: Vascular;  Laterality: Left;  . LUNG BIOPSY  2010  . LUNG TRANSPLANT, SINGLE Right   . POLYPECTOMY  11/07/2017   Procedure: POLYPECTOMY;  Surgeon: Ronnette Juniper, MD;  Location: [facility];  Service: Gastroenterology;;       Family History  Problem Relation Age of Onset  . Pancreatic cancer Brother   . Heart disease Father     Social History   Tobacco Use  . Smoking status: Former Smoker    Packs/day: 0.30    Years: 20.00    Pack years: 6.00    Types: Cigarettes    Quit date: 06/06/2002    Years since quitting: 18.1  . Smokeless tobacco: Never Used  Vaping Use  . Vaping Use: Never used  Substance Use Topics  . Alcohol use: No    Alcohol/week: 0.0 standard drinks  . Drug use: No    Home Medications Prior to Admission medications   Medication Sig Start Date End Date Taking? Authorizing Provider  ACCU-CHEK FASTCLIX LANCETS MISC As directed up to 4 times daily 12/16/15   [provider]  acetaminophen (TYLENOL) 500 MG tablet Take 500-1,000 mg by mouth every 6 (six) hours as needed for moderate pain or headache.     [provider]  Alpha Lipoic Acid 200 MG CAPS Take 200-400 mg by mouth See admin instructions. On Monday, Wednesday, Friday (dialysis days): take 2 capsules (400 mg) by mouth with breakfast and 1 capsule (200 mg) with supper; on Sunday, Tuesday, Thursday, Saturday (non-dialysis days): take 1 capsule (200 mg) three times daily with meals    [provider]  aspirin 81 MG tablet Take 81 mg by mouth daily with breakfast.     [provider]  azaTHIOprine (IMURAN) 50 MG tablet Take 50 mg by mouth See admin instructions. Take one tablet (50 mg) by mouth on Monday, Wednesday, Friday after dialysis    [provider]  calcium acetate (PHOSLO) 667 MG capsule Take 667 mg by mouth 3 (three) times daily before meals.     [provider]  cycloSPORINE modified (GENGRAF) 25 MG capsule Take 75-100 mg by mouth See admin instructions. Take 3 capsule (75 mg) by mouth daily with breakfast and 4 capsules (100 mg) at bedtime    [provider]  diclofenac Sodium (VOLTAREN) 1 % GEL Apply 2 g topically 4  (four) times daily. 07/06/20   White, Leitha Schuller, NP  dorzolamidel-timolol (COSOPT PF) 22.3-6.8 MG/ML SOLN ophthalmic solution Place 1 drop into both eyes 2 (two) times daily.    [provider]  ferrous sulfate 325 (65 FE) MG tablet Take 325 mg by mouth See admin instructions. On Monday, Wednesday, Friday (dialysis days): take 1 tablet (325 mg) by mouth daily with breakfast; on Sunday, Tuesday, Thursday, Saturday (non-dialysis days): take 1 tablet (325 mg) by mouth daily with lunch.    [provider]  fluorometholone (FML) 0.1 % ophthalmic suspension Place 1 drop into the right eye 4 (four) times daily. 04/11/20   [provider]  fluticasone (FLONASE) 50 MCG/ACT nasal spray Place 2 sprays into the nose daily. Patient taking differently: Place 2 sprays into the nose daily as needed for allergies.  03/05/13   Wenda Low, MD  Immune Globulin 10% (PRIVIGEN) 20 GM/200ML SOLN Inject 0.5 g/kg into the vein every 3 (three) months.     [provider]  insulin regular (NOVOLIN R) 100 units/mL  injection Inject 5-9 Units into the skin See admin instructions. Inject 5 units subcutaneously with breakfast and 6 units with lunch and supper; PLUS adjustment per sliding scale: CBG [phone] add 1 unit, 261-360 add 2 units, [phone] add 3 units    [provider]  midodrine (PROAMATINE) 5 MG tablet Take 5-10 mg by mouth See admin instructions. On Monday, Wednesday, Friday(dialysis days): take 2 tablets (10 mg) by mouth before dialysis, take 1 tablet (5 mg) after dialysis as needed for SBP >90. On Sunday, Tuesday, Thursday, Saturday (non-dialysis days): take 1 tablet (5 mg) by mouth 3 times daily as needed for SBP >90. 11/18/19   [provider]  Multiple Vitamin (MULTIVITAMIN WITH MINERALS) TABS tablet Take 1 tablet by mouth daily. Centrum for Men 50 plus: on Monday, Wednesday, Friday (dialysis days): take one tablet daily with breakfast; on Sunday, Tuesday, Thursday,  Saturday: take one tablet daily with supper    [provider]  omeprazole (PRILOSEC) 40 MG capsule Take 40 mg by mouth 2 (two) times daily with a meal.  11/12/18   [provider]  oxyCODONE-acetaminophen (PERCOCET) 5-325 MG tablet Take 1 tablet by mouth every 6 (six) hours as needed for moderate pain or severe pain. 07/15/20   Jonathan Moras, PA-C  polyvinyl alcohol-povidone (REFRESH) 1.4-0.6 % ophthalmic solution Place 1 drop into both eyes at bedtime.     [provider]  pravastatin (PRAVACHOL) 10 MG tablet Take 10 mg by mouth at bedtime. 03/16/20   [provider]  predniSONE (DELTASONE) 5 MG tablet Take 5 mg by mouth daily with breakfast.    [provider]  sulfamethoxazole-trimethoprim (BACTRIM,SEPTRA) 400-80 MG tablet Take 1 tablet by mouth See admin instructions. On Monday, Wednesday, Friday (dialysis days): take one tablet by mouth daily with supper 02/27/17   [provider]  tizanidine (ZANAFLEX) 2 MG capsule Take 1 capsule (2 mg total) by mouth at bedtime as needed for muscle spasms. 07/06/20   Hans Eden, NP  valGANciclovir (VALCYTE) 450 MG tablet Take 450 mg by mouth See admin instructions. Take one tablet (450 mg) by mouth on Monday and Friday after dialysis 03/26/15   [provider]  vitamin B-6 (PYRIDOXINE) 25 MG tablet Take 25 mg by mouth daily with breakfast.     [provider]    Allergies    Levofloxacin and Nsaids  Review of Systems   Review of Systems  Constitutional: Positive for appetite change and fatigue. Negative for chills and fever.  Eyes: Negative for pain and visual disturbance.  Respiratory: Positive for cough and shortness of breath.   Cardiovascular: Positive for leg swelling. Negative for chest pain.  Gastrointestinal: Negative for abdominal pain and vomiting.  Genitourinary: Negative for dysuria and hematuria.  Musculoskeletal: Negative for arthralgias and back pain.  Skin: Negative  for color change and rash.  Neurological: Negative for syncope and headaches.  All other systems reviewed and are negative.   Physical Exam Updated Vital Signs BP 134/71   Pulse 87   Temp 98.4 F (36.9 C)   Resp (!) 25   SpO2 100%   Physical Exam Constitutional:      General: He is not in acute distress.    Comments: Thin, chronically ill appearing  HENT:     Head: Normocephalic and atraumatic.  Eyes:     Conjunctiva/sclera: Conjunctivae normal.     Pupils: Pupils are equal, round, and reactive to light.  Cardiovascular:     Rate and Rhythm: Normal  rate and regular rhythm.  Pulmonary:     Comments: Breathing with moderate effort (pt reports this is chronic state of breathing) Rhonchi bilaterally, poor air movement bilaterally 95% on room air Abdominal:     General: There is no distension.     Tenderness: There is no abdominal tenderness.  Skin:    General: Skin is warm and dry.  Neurological:     General: No focal deficit present.     Mental Status: He is alert. Mental status is at baseline.  Psychiatric:        Mood and Affect: Mood normal.        Behavior: Behavior normal.     ED Results / Procedures / Treatments   Labs (all labs ordered are listed, but only abnormal results are displayed) Labs Reviewed  BASIC METABOLIC PANEL - Abnormal; Notable for the following components:      Result Value   Chloride 96 (*)    Glucose, Bld 128 (*)    Creatinine, Ser 3.54 (*)    Calcium 8.6 (*)    GFR, Estimated 18 (*)    Anion gap 17 (*)    All other components within normal limits  CBC - Abnormal; Notable for the following components:   WBC 3.8 (*)    RBC 3.54 (*)    Hemoglobin 12.3 (*)    HCT 38.2 (*)    MCV 107.9 (*)    MCH 34.7 (*)    All other components within normal limits    EKG None  Radiology CT Chest Wo Contrast  Result Date: 07/15/2020 CLINICAL DATA:  Fall, left rib pain with left rib fracture suspected. History of lung transplant. EXAM: CT CHEST  WITHOUT CONTRAST TECHNIQUE: Multidetector CT imaging of the chest was performed following the standard protocol without IV contrast. COMPARISON:  Chest radiograph from earlier today. 01/08/2020 chest CT angiogram. FINDINGS: Cardiovascular: Normal heart size. No significant pericardial fluid/thickening. Three-vessel coronary atherosclerosis. Atherosclerotic nonaneurysmal thoracic aorta. Top-normal caliber main pulmonary artery (3.3 cm diameter). No evidence of acute thoracic aortic intramural hematoma. Left brachiocephalic vein stent is stable in position. Left cephalic vein stent is in place. Mediastinum/Nodes: No pneumomediastinum. No mediastinal hematoma. No discrete thyroid nodules. Unremarkable esophagus. No axillary, mediastinal or hilar lymphadenopathy. Lungs/Pleura: No pneumothorax. No pleural effusion. Moderate centrilobular and paraseptal emphysema in the left lung. Asymmetric prominent volume loss in the left lung, unchanged. Stable thin linear scar in the right lower lobe. New masslike consolidation in the posterior apical left upper lobe measuring 3.9 x 3.6 cm (series 5/image 23). Extensive patchy confluent reticulation and ground-glass opacity throughout the left lung with associated prominent traction bronchiectasis and architectural distortion, unchanged, compatible with band. Fibrotic interstitial lung disease. No additional significant pulmonary nodules. Upper Abdomen: Cholelithiasis. Diffusely mildly irregular liver surface suggesting cirrhosis. Nonobstructing upper right renal stones, largest 3 mm. Musculoskeletal: Subtle erosive change along the anterior surface of the posterior left third rib (series 5/image 20). No fracture detected in the chest. Mild thoracic spondylosis. IMPRESSION: 1. New masslike consolidation in the posterior apical left upper lobe measuring 3.9 x 3.6 cm, cannot exclude primary bronchogenic carcinoma. PET-CT suggested for further evaluation. 2. Subtle erosive change along  the anterior surface of the posterior left third rib, cannot exclude direct tumor involvement. No discrete bone fracture in the chest. 3. 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 No acute pulmonary disease in the right lung transplant. 4. Cholelithiasis. 5. Morphologic changes in the liver suggestive of cirrhosis. 6. Nonobstructing  right nephrolithiasis. 7. Aortic Atherosclerosis (ICD10-I70.0) and Emphysema (ICD10-J43.9). Electronically Signed   By: Ilona Sorrel M.D.   On: 07/15/2020 17:31    Procedures Procedures   Medications Ordered in ED Medications - No data to display  ED Course  I have reviewed the triage vital signs and the nursing notes.  Pertinent labs & imaging results that were available during my care of the patient were reviewed by me and considered in my medical decision making (see chart for details).   70 yo male here with progressively worsening dyspnea over 2-3 weeks.  He has a complicated pulmonary history which may be contributing to his dypsnea.  This includes ILD, pulm HTN, and immunosuppression for lung transplant, as well as new mass noted on CT scan 2 days ago - raising concern for concern for worsening chronic conditions or infection.  CHF cannot be excluded at this time either.  However he is not hypoxia on my exam, and is not requiring emergent bipap or airway intervention.  He also has no immediate indication for emergent HD and has not missed any sessions.  Pulm team consulted as noted below Labs from triage reviewed - no sig change from recent levels.  Cr near baseline level.  K 40.  BNP mildly elevated at 272.  Lower suspicion for PE.  This appears to be more related to his chronic lung disease. Likewise low suspicion for ACS.  The patient had refused xray during my initial assessment, pointing out he had film 2 days ago and does not want additional cost or radiation.  I think this is reasonable.  I don't think we're dealing with PTX or  large pleural effusion here - I'll allow hospitalist or pulm team to determine necessity of advanced imaging.  Prior medical records reviewed including outpatient records and recent ED visit  Clinical Course as of 07/17/20 1755  Fri Jul 17, 2020  1453 With his extensive pulmonary history and questionable new mass on CT on prior presentation, I've placed a consult to our pulm team.   [MT]  1500 Spoke to [facility] team who will consult on patient [MT]  1622 Signed out to Dr Almyra Free EDP pending pulm consult [MT]    Clinical Course User Index [MT] Langston Masker, Carola Rhine, MD    Final Clinical Impression(s) / ED Diagnoses Final diagnoses:  None    Rx / DC Orders ED Discharge Orders    None       Wyvonnia Dusky, MD 07/17/20 1801

## 2020-07-17 NOTE — Consult Note (Signed)
NAME:  [name], MRN:  [id number], DOB:  26-Jan-1951, LOS: 0 ADMISSION DATE:  07/17/2020, CONSULTATION DATE:  07/17/2021 REFERRING MD: Langston Masker , CHIEF COMPLAINT:  New Lung mass, worsening dyspnea in setting of ILD, Lung Transplant 2016   Brief History:  70 year old male significant history of pulmonary fibrosis, diabetes, hypertension, end-stage renal disease currently on dialysis ( No missed treatments), lung transplant  (2016 at [facility] immunosuppresive medications, ESRD on MWF dialysis  Presenting 2/11  for evaluation of worsening dyspnea in setting of ILD, new lung mass. PCCM have been asked to see as a pulmonary consult. Of note, patient was seen in [facility] ED 2/9 for dyspnea and chest pain in the setting of a mechanical fall.   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 The change in his breathing was noted 2-3 weeks ago. He does not endorse any fever,hemoptysis or new lower extremity edema. Marland Kitchen  PCCM have been asked to see as a pulmonary consult. Of note, patient was seen in [facility] ED 2/9 for dyspnea and chest pain in the setting of a mechanical fall. He was discharged home. In the ED patient is on RA and sats are 100% at rest. He states his dyspnea is with minimal exertion, less at rest.. He states he notices his respiratory  rate increases and he gets dizzy and has to sit down. He states he does have oxygen sat monitor at home, but his hands stay so cold he does not get a reading. He does not have home oxygen. Pt. States he is compliant with his suppressive medications, and he does not miss HD. States he feels his dyspnea is worse after HD treatments.  CT Scan is significant for a  New masslike consolidation in the posterior apical left upper lobe measuring 3.9 x 3.6 cm. This is a new , was not seen on imaging 6 months ago. Afebrile, WBC of 3.4, HGB 12.3, platelets 223 Creatinine 3.54  Past Medical History:   Past Medical History:  Diagnosis Date  . Aortic valve disorders   . Benign neoplasm of colon   . Degeneration of intervertebral disc, site unspecified   . Diabetes mellitus without complication ([place])   . 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 Esophageal reflux   . ESRD (end stage renal disease) on dialysis ([place])   . Essential hypertension   . Obstructive sleep apnea (adult) (pediatric)   . Osteoarthrosis, unspecified whether generalized or localized, unspecified site   . Other and unspecified hyperlipidemia   . Pneumonia    interstitial pneumonia  . Pulmonary fibrosis ([place])   . Renal disorder   . Respiratory failure with hypoxia ([place]) 12/2015  . Transplanted, lung ([place])   . 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 PET-CT suggested for further evaluation. Subtle erosive change along the anterior surface of the posterior left third rib, cannot exclude direct tumor involvement. No discrete bone fracture in the  chest. 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 No acute pulmonary disease in the right lung transplant. Cholelithiasis. Morphologic changes in the liver suggestive of cirrhosis. Nonobstructing right nephrolithiasis. Aortic Atherosclerosis (ICD10-I70.0) and Emphysema (ICD10-J43.9).   Micro Data:    Antimicrobials:  07/15/2020  SARS Coronavirus 2 by RT PCR NEGATIVE  NEGATIVE  NEGATIVE CM      Interim History / Subjective:  Currently on RA at rest with sat of 100% Afebrile, WBC 3.4   Objective   Blood pressure 123/64, pulse 85, temperature 98.4 F (36.9 C), resp. rate 15, SpO2 95 %.       No intake or output data in the 24 hours ending 07/17/20 1506 There were no vitals filed for this visit.  Examination: General: Awake and alert elderly male , in NAD, on RA HENT: NCAT, No LAD, No JVD Lungs: Right lung is clear, slightly diminished per bases, L lung with crackles, few wheezes  Cardiovascular: S1, S2, RRR, No RMG Abdomen: Soft, NT, ND, BS + Extremities: No edema, no lesions, no rash Neuro: Awake and alert and oriented x 3 GU: NA  Resolved Hospital Problem list     Assessment & Plan:  New Masslike consolidation in the posterior apical left upper lobe ( native lung) in immunocompromised patient. ( Atypical Pneumonia vs abscess vs malignancy ) Right Lung Transplant 2016 on immunosuppressive therapy Worsening dyspnea x 2 weeks Plan Recommend admission per [facility] Start  Zosyn per Pharmacy ( renal dosing ) Procalcitonin now Blood Cultures x 2 now Sputum for Culture, fungal and AFB Sputum for Fungitel Quantiferon Gold TB  Trend CBC, WBC BD as needed for wheezing   Dyspnea/ Hypoxemia with minimal exertion 2/2 above ILD per L native lung Plan Titrate oxygen for sats > 92% Aggressive pulmonary toilet OOB to chair Flutter valve Mucinex CXR prn Will need walking oxygen saturation prior to d/c home  ESRD Plan [facility] to consult Renal for Inpatient HD Trend BMET Maintain renal perfusion Avoid nephrotoxic medications  Will treat as pneumonia, and pulmonary will continue to follow as consult  Lung Transplant Team at [facility]  PT transplant Coordinator at [facility] >> Carlyon Shadow >> [phone] Fax number they have requested noted be sent to>> [phone]   Best practice (evaluated daily)  Diet: Per Primary Pain/Anxiety/Delirium protocol  (if indicated): Per Primary VAP protocol (if indicated): NA DVT prophylaxis: Per Primary team GI prophylaxis: Per Primary Team Glucose control: Per Primary team Mobility: OOB to chair  Disposition:Per Primary team  Goals of Care:  Last date of multidisciplinary goals of care discussion: Family and staff present:  Summary of discussion:  Follow up goals of care discussion due:  Code Status: Full  Labs   CBC: Recent Labs  Lab 07/15/20 1354 07/17/20 1320  WBC 3.4* 3.8*  HGB 12.5* 12.3*  HCT 40.9 38.2*  MCV 112.4* 107.9*  PLT 168 458    Basic Metabolic Panel: Recent Labs  Lab 07/15/20 1354 07/17/20 1320  NA 137 139  K 4.4 4.0  CL 95* 96*  CO2 23 26  GLUCOSE 144* 128*  BUN 8 11  CREATININE 3.89* 3.54*  CALCIUM 8.9 8.6*   GFR: CrCl cannot be calculated (Unknown ideal weight.). Recent Labs  Lab 07/15/20 1354 07/17/20 1320  WBC 3.4* 3.8*    Liver Function Tests: No results for input(s): AST, ALT, ALKPHOS, BILITOT, PROT, ALBUMIN in the last 168 hours. No results for input(s): LIPASE, AMYLASE in the last 168 hours. No results for input(s): AMMONIA in the  last 168 hours.  ABG    Component Value Date/Time   PHART 7.381 01/01/2016 1843   PCO2ART 42.4 01/01/2016 1843   PO2ART 78.0 (L) 01/01/2016 1843   HCO3 26.8 03/28/2018 1010   TCO2 26 02/05/2019 1552   ACIDBASEDEF 2.0 12/30/2015 0841   O2SAT 53.0 03/28/2018 1010     Coagulation Profile: No results for input(s): INR, PROTIME in the last 168 hours.  Cardiac Enzymes: No results for input(s): CKTOTAL, CKMB, CKMBINDEX, TROPONINI in the last 168 hours.  HbA1C: Hgb A1c MFr Bld  Date/Time Value Ref Range Status  01/08/2020 10:59 AM 5.8 (H) 4.8 - 5.6 % Final    Comment:    (NOTE) Pre diabetes:          5.7%-6.4%  Diabetes:              >6.4%  Glycemic control for   <7.0% adults with diabetes   01/14/2019 03:30 AM 7.3 (H) 4.8 - 5.6 % Final    Comment:    (NOTE) Pre diabetes:           5.7%-6.4% Diabetes:              >6.4% Glycemic control for   <7.0% adults with diabetes     CBG: No results for input(s): GLUCAP in the last 168 hours.  Review of Systems:   + for fatigue, dyspnea with exertion L Shoulder pain  Vague chest pain  Past Medical History:  He,  has a past medical history of Aortic valve disorders, Benign neoplasm of colon, Degeneration of intervertebral disc, site unspecified, Diabetes mellitus without complication ([place]), Diaphragmatic hernia without mention of obstruction or gangrene, Esophageal reflux, ESRD (end stage renal disease) on dialysis [facility]), Essential hypertension, Obstructive sleep apnea (adult) (pediatric), Osteoarthrosis, unspecified whether generalized or localized, unspecified site, Other and unspecified hyperlipidemia, Pneumonia, Pulmonary fibrosis ([place]), Renal disorder, Respiratory failure with hypoxia ([place]) (12/2015), Transplanted, lung ([place]), and Unspecified essential hypertension.   Surgical History:   Past Surgical History:  Procedure Laterality Date  . COLONOSCOPY WITH PROPOFOL N/A 11/07/2017   Procedure: COLONOSCOPY WITH PROPOFOL;  Surgeon: Ronnette Juniper, MD;  Location: [facility];  Service: Gastroenterology;  Laterality: N/A;  . LIGATION OF ARTERIOVENOUS  FISTULA Left 01/14/2019   Procedure: LIGATION OF ARTERIOVENOUS  FISTULA;  Surgeon: Marty Heck, MD;  Location: [place];  Service: Vascular;  Laterality: Left;  . LUNG BIOPSY  2010  . LUNG TRANSPLANT, SINGLE Right   . POLYPECTOMY  11/07/2017   Procedure: POLYPECTOMY;  Surgeon: Ronnette Juniper, MD;  Location: [facility];  Service: Gastroenterology;;     Social History:   reports that he quit smoking about 18 years ago. His smoking use included cigarettes. He has a 6.00 pack-year smoking history. He has never used smokeless tobacco. He reports that he does not drink alcohol and does not use drugs.   Family History:  His family history includes Heart disease in his father;  Pancreatic cancer in his brother.   Allergies Allergies  Allergen Reactions  . Levofloxacin Other (See Comments)    LOSS OF CONSCIOUSNESS  . Nsaids Other (See Comments)    Patient instructed not to take NSAID's after his lung transplant     Home Medications  Prior to Admission medications   Medication Sig Start Date End Date Taking? Authorizing Provider  ACCU-CHEK FASTCLIX LANCETS MISC As directed up to 4 times daily 12/16/15   [provider]  acetaminophen (TYLENOL) 500 MG tablet Take 500-1,000 mg by mouth every  6 (six) hours as needed for moderate pain or headache.     [provider]  Alpha Lipoic Acid 200 MG CAPS Take 200-400 mg by mouth See admin instructions. On Monday, Wednesday, Friday (dialysis days): take 2 capsules (400 mg) by mouth with breakfast and 1 capsule (200 mg) with supper; on Sunday, Tuesday, Thursday, Saturday (non-dialysis days): take 1 capsule (200 mg) three times daily with meals    [provider]  aspirin 81 MG tablet Take 81 mg by mouth daily with breakfast.     [provider]  azaTHIOprine (IMURAN) 50 MG tablet Take 50 mg by mouth See admin instructions. Take one tablet (50 mg) by mouth on Monday, Wednesday, Friday after dialysis    [provider]  calcium acetate (PHOSLO) 667 MG capsule Take 667 mg by mouth 3 (three) times daily before meals.     [provider]  cycloSPORINE modified (GENGRAF) 25 MG capsule Take 75-100 mg by mouth See admin instructions. Take 3 capsule (75 mg) by mouth daily with breakfast and 4 capsules (100 mg) at bedtime    [provider]  diclofenac Sodium (VOLTAREN) 1 % GEL Apply 2 g topically 4 (four) times daily. 07/06/20   White, Leitha Schuller, NP  dorzolamidel-timolol (COSOPT PF) 22.3-6.8 MG/ML SOLN ophthalmic solution Place 1 drop into both eyes 2 (two) times daily.    [provider]  ferrous sulfate 325 (65 FE) MG tablet Take 325 mg by mouth See admin instructions.  On Monday, Wednesday, Friday (dialysis days): take 1 tablet (325 mg) by mouth daily with breakfast; on Sunday, Tuesday, Thursday, Saturday (non-dialysis days): take 1 tablet (325 mg) by mouth daily with lunch.    [provider]  fluorometholone (FML) 0.1 % ophthalmic suspension Place 1 drop into the right eye 4 (four) times daily. 04/11/20   [provider]  fluticasone (FLONASE) 50 MCG/ACT nasal spray Place 2 sprays into the nose daily. Patient taking differently: Place 2 sprays into the nose daily as needed for allergies.  03/05/13   Wenda Low, MD  Immune Globulin 10% (PRIVIGEN) 20 GM/200ML SOLN Inject 0.5 g/kg into the vein every 3 (three) months.     [provider]  insulin regular (NOVOLIN R) 100 units/mL injection Inject 5-9 Units into the skin See admin instructions. Inject 5 units subcutaneously with breakfast and 6 units with lunch and supper; PLUS adjustment per sliding scale: CBG [phone] add 1 unit, [phone] add 2 units, [phone] add 3 units    [provider]  midodrine (PROAMATINE) 5 MG tablet Take 5-10 mg by mouth See admin instructions. On Monday, Wednesday, Friday(dialysis days): take 2 tablets (10 mg) by mouth before dialysis, take 1 tablet (5 mg) after dialysis as needed for SBP >90. On Sunday, Tuesday, Thursday, Saturday (non-dialysis days): take 1 tablet (5 mg) by mouth 3 times daily as needed for SBP >90. 11/18/19   [provider]  Multiple Vitamin (MULTIVITAMIN WITH MINERALS) TABS tablet Take 1 tablet by mouth daily. Centrum for Men 50 plus: on Monday, Wednesday, Friday (dialysis days): take one tablet daily with breakfast; on Sunday, Tuesday, Thursday, Saturday: take one tablet daily with supper    [provider]  omeprazole (PRILOSEC) 40 MG capsule Take 40 mg by mouth 2 (two) times daily with a meal.  11/12/18   [provider]  oxyCODONE-acetaminophen (PERCOCET) 5-325 MG tablet Take 1 tablet by mouth every 6 (six)  hours as needed for moderate pain or severe pain. 07/15/20  Domenic Moras, PA-C  polyvinyl alcohol-povidone (REFRESH) 1.4-0.6 % ophthalmic solution Place 1 drop into both eyes at bedtime.     [provider]  pravastatin (PRAVACHOL) 10 MG tablet Take 10 mg by mouth at bedtime. 03/16/20   [provider]  predniSONE (DELTASONE) 5 MG tablet Take 5 mg by mouth daily with breakfast.    [provider]  sulfamethoxazole-trimethoprim (BACTRIM,SEPTRA) 400-80 MG tablet Take 1 tablet by mouth See admin instructions. On Monday, Wednesday, Friday (dialysis days): take one tablet by mouth daily with supper 02/27/17   [provider]  tizanidine (ZANAFLEX) 2 MG capsule Take 1 capsule (2 mg total) by mouth at bedtime as needed for muscle spasms. 07/06/20   Hans Eden, NP  valGANciclovir (VALCYTE) 450 MG tablet Take 450 mg by mouth See admin instructions. Take one tablet (450 mg) by mouth on Monday and Friday after dialysis 03/26/15   [provider]  vitamin B-6 (PYRIDOXINE) 25 MG tablet Take 25 mg by mouth daily with breakfast.     [provider]          Magdalen Spatz, MSN, AGACNP-BC [place] for personal pager 07/17/2020 5:01 PM

## 2020-07-17 NOTE — H&P (Signed)
History and Physical    [name] [name] DOB: 10/31/1950 DOA: 07/17/2020  PCP: Wenda Low, MD  Patient coming from: Home.  Chief Complaint: Shortness of breath.  HPI: [name] is a 70 y.o. male with history of interstitial lung disease status post single lung transplant in 2016 at [facility] presents to the ER with complaints of increasing shortness of breath ongoing for the last 2 weeks.  Patient also had a recent fall on ice about 2 weeks ago.  Patient states he has minimal productive cough.  Shortness of breath increased on exertion.  Mild chest pain on the left upper chest wall around the inferior aspect of the clavicle.  ED Course: In the ER patient underwent CT chest which shows left upper lobe masslike consolidation for which pulmonary consult was requested.  Dr. Lamonte Sakai pulmonologist requested starting antibiotics for possible abscess versus pneumonia and also other differentials include possible malignancy.  Labs are largely at baseline.  BNP is 272 and high since he troponin was 12.  Covid test is negative.  Review of Systems: As per HPI, rest all negative.   Past Medical History:  Diagnosis Date  . Aortic valve disorders   . Benign neoplasm of colon   . Degeneration of intervertebral disc, site unspecified   . Diabetes mellitus without complication ([place])   . 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 Esophageal reflux   . ESRD (end stage renal disease) on dialysis ([place])   . Essential hypertension   . Obstructive sleep apnea (adult) (pediatric)   . Osteoarthrosis, unspecified whether generalized or localized, unspecified site   . Other and unspecified hyperlipidemia   . Pneumonia    interstitial pneumonia  . Pulmonary fibrosis ([place])   . Renal disorder   . Respiratory failure with hypoxia ([place]) 12/2015  . Transplanted, lung ([place])   . Unspecified essential hypertension     Past Surgical History:   Procedure Laterality Date  . COLONOSCOPY WITH PROPOFOL N/A 11/07/2017   Procedure: COLONOSCOPY WITH PROPOFOL;  Surgeon: Ronnette Juniper, MD;  Location: [facility];  Service: Gastroenterology;  Laterality: N/A;  . LIGATION OF ARTERIOVENOUS  FISTULA Left 01/14/2019   Procedure: LIGATION OF ARTERIOVENOUS  FISTULA;  Surgeon: Marty Heck, MD;  Location: [facility];  Service: Vascular;  Laterality: Left;  . LUNG BIOPSY  2010  . LUNG TRANSPLANT, SINGLE Right   . POLYPECTOMY  11/07/2017   Procedure: POLYPECTOMY;  Surgeon: Ronnette Juniper, MD;  Location: [facility];  Service: Gastroenterology;;     reports that he quit smoking about 18 years ago. His smoking use included cigarettes. He has a 6.00 pack-year smoking history. He has never used smokeless tobacco. He reports that he does not drink alcohol and does not use drugs.  Allergies  Allergen Reactions  . Levofloxacin Other (See Comments)    LOSS OF CONSCIOUSNESS  . Nsaids Other (See Comments)    Patient instructed not to take NSAID's after his lung transplant    Family History  Problem Relation Age of Onset  . Pancreatic cancer Brother   . Heart disease Father     Prior to Admission medications   Medication Sig Start Date End Date Taking? Authorizing Provider  ACCU-CHEK FASTCLIX LANCETS MISC As directed up to 4 times daily 12/16/15   [provider]  acetaminophen (TYLENOL) 500 MG tablet Take 500-1,000 mg by mouth every 6 (six) hours as needed for moderate pain or headache.  [provider]  Alpha Lipoic Acid 200 MG CAPS Take 200-400 mg by mouth See admin instructions. On Monday, Wednesday, Friday (dialysis days): take 2 capsules (400 mg) by mouth with breakfast and 1 capsule (200 mg) with supper; on Sunday, Tuesday, Thursday, Saturday (non-dialysis days): take 1 capsule (200 mg) three times daily with meals    [provider]  aspirin 81 MG tablet Take 81 mg by mouth daily with breakfast.     [provider]  azaTHIOprine (IMURAN) 50 MG tablet Take 50 mg by mouth See admin instructions. Take one tablet (50 mg) by mouth on Monday, Wednesday, Friday after dialysis    [provider]  calcium acetate (PHOSLO) 667 MG capsule Take 667 mg by mouth 3 (three) times daily before meals.     [provider]  cycloSPORINE modified (GENGRAF) 25 MG capsule Take 75-100 mg by mouth See admin instructions. Take 3 capsule (75 mg) by mouth daily with breakfast and 4 capsules (100 mg) at bedtime    [provider]  diclofenac Sodium (VOLTAREN) 1 % GEL Apply 2 g topically 4 (four) times daily. 07/06/20   White, Leitha Schuller, NP  dorzolamidel-timolol (COSOPT PF) 22.3-6.8 MG/ML SOLN ophthalmic solution Place 1 drop into both eyes 2 (two) times daily.    [provider]  ferrous sulfate 325 (65 FE) MG tablet Take 325 mg by mouth See admin instructions. On Monday, Wednesday, Friday (dialysis days): take 1 tablet (325 mg) by mouth daily with breakfast; on Sunday, Tuesday, Thursday, Saturday (non-dialysis days): take 1 tablet (325 mg) by mouth daily with lunch.    [provider]  fluorometholone (FML) 0.1 % ophthalmic suspension Place 1 drop into the right eye 4 (four) times daily. 04/11/20   [provider]  fluticasone (FLONASE) 50 MCG/ACT nasal spray Place 2 sprays into the nose daily. Patient taking differently: Place 2 sprays into the nose daily as needed for allergies.  03/05/13   Wenda Low, MD  Immune Globulin 10% (PRIVIGEN) 20 GM/200ML SOLN Inject 0.5 g/kg into the vein every 3 (three) months.     [provider]  insulin regular (NOVOLIN R) 100 units/mL injection Inject 5-9 Units into the skin See admin instructions. Inject 5 units subcutaneously with breakfast and 6 units with lunch and supper; PLUS adjustment per sliding scale: CBG [phone] add 1 unit, [phone] add 2 units, [phone] add 3 units    [provider]  midodrine (PROAMATINE) 5 MG tablet  Take 5-10 mg by mouth See admin instructions. On Monday, Wednesday, Friday(dialysis days): take 2 tablets (10 mg) by mouth before dialysis, take 1 tablet (5 mg) after dialysis as needed for SBP >90. On Sunday, Tuesday, Thursday, Saturday (non-dialysis days): take 1 tablet (5 mg) by mouth 3 times daily as needed for SBP >90. 11/18/19   [provider]  Multiple Vitamin (MULTIVITAMIN WITH MINERALS) TABS tablet Take 1 tablet by mouth daily. Centrum for Men 50 plus: on Monday, Wednesday, Friday (dialysis days): take one tablet daily with breakfast; on Sunday, Tuesday, Thursday, Saturday: take one tablet daily with supper    [provider]  omeprazole (PRILOSEC) 40 MG capsule Take 40 mg by mouth 2 (two) times daily with a meal.  11/12/18   [provider]  oxyCODONE-acetaminophen (PERCOCET) 5-325 MG tablet Take 1 tablet by mouth every 6 (six) hours as needed for moderate pain or severe pain. 07/15/20   Domenic Moras, PA-C  polyvinyl alcohol-povidone (REFRESH) 1.4-0.6 % ophthalmic solution Place  1 drop into both eyes at bedtime.     [provider]  pravastatin (PRAVACHOL) 10 MG tablet Take 10 mg by mouth at bedtime. 03/16/20   [provider]  predniSONE (DELTASONE) 5 MG tablet Take 5 mg by mouth daily with breakfast.    [provider]  sulfamethoxazole-trimethoprim (BACTRIM,SEPTRA) 400-80 MG tablet Take 1 tablet by mouth See admin instructions. On Monday, Wednesday, Friday (dialysis days): take one tablet by mouth daily with supper 02/27/17   [provider]  tizanidine (ZANAFLEX) 2 MG capsule Take 1 capsule (2 mg total) by mouth at bedtime as needed for muscle spasms. 07/06/20   Hans Eden, NP  valGANciclovir (VALCYTE) 450 MG tablet Take 450 mg by mouth See admin instructions. Take one tablet (450 mg) by mouth on Monday and Friday after dialysis 03/26/15   [provider]  vitamin B-6 (PYRIDOXINE) 25 MG tablet Take 25 mg by mouth daily  with breakfast.     [provider]    Physical Exam: Constitutional: Moderately built and nourished. Vitals:   07/17/20 1827 07/17/20 1845 07/17/20 1900 07/17/20 1915  BP: 138/73 130/68 133/70 133/74  Pulse: 82 78 79 76  Resp: 19 16 17 13   Temp: 98.6 F (37 C)     TempSrc: Oral     SpO2: 99% 97% 96% 97%   Eyes: Anicteric no pallor. ENMT: No discharge from the ears eyes nose or mouth. Neck: No mass felt.  No neck rigidity. Respiratory: No rhonchi or crepitations. Cardiovascular: S1-S2 heard. Abdomen: Soft nontender bowel sounds present. Musculoskeletal: No edema. Skin: No rash. Neurologic: Alert awake oriented to time place and person.  Moves all extremities. Psychiatric: Appears normal.  Normal affect.   Labs on Admission: I have personally reviewed following labs and imaging studies  CBC: Recent Labs  Lab 07/15/20 1354 07/17/20 1320  WBC 3.4* 3.8*  HGB 12.5* 12.3*  HCT 40.9 38.2*  MCV 112.4* 107.9*  PLT 168 616   Basic Metabolic Panel: Recent Labs  Lab 07/15/20 1354 07/17/20 1320  NA 137 139  K 4.4 4.0  CL 95* 96*  CO2 23 26  GLUCOSE 144* 128*  BUN 8 11  CREATININE 3.89* 3.54*  CALCIUM 8.9 8.6*   GFR: CrCl cannot be calculated (Unknown ideal weight.). Liver Function Tests: No results for input(s): AST, ALT, ALKPHOS, BILITOT, PROT, ALBUMIN in the last 168 hours. No results for input(s): LIPASE, AMYLASE in the last 168 hours. No results for input(s): AMMONIA in the last 168 hours. Coagulation Profile: No results for input(s): INR, PROTIME in the last 168 hours. Cardiac Enzymes: No results for input(s): CKTOTAL, CKMB, CKMBINDEX, TROPONINI in the last 168 hours. BNP (last 3 results) No results for input(s): PROBNP in the last 8760 hours. HbA1C: No results for input(s): HGBA1C in the last 72 hours. CBG: No results for input(s): GLUCAP in the last 168 hours. Lipid Profile: No results for input(s): CHOL, HDL, LDLCALC, TRIG, CHOLHDL, LDLDIRECT  in the last 72 hours. Thyroid Function Tests: No results for input(s): TSH, T4TOTAL, FREET4, T3FREE, THYROIDAB in the last 72 hours. Anemia Panel: No results for input(s): VITAMINB12, FOLATE, FERRITIN, TIBC, IRON, RETICCTPCT in the last 72 hours. Urine analysis:    Component Value Date/Time   COLORURINE AMBER (A) 12/31/2015 0515   APPEARANCEUR CLOUDY (A) 12/31/2015 0515   LABSPEC 1.016 12/31/2015 0515   PHURINE 7.0 12/31/2015 0515   GLUCOSEU NEGATIVE 12/31/2015 0515   HGBUR NEGATIVE 12/31/2015 0515   BILIRUBINUR SMALL (A) 12/31/2015  0515   KETONESUR 15 (A) 12/31/2015 0515   PROTEINUR 100 (A) 12/31/2015 0515   UROBILINOGEN 2.0 (H) 08/15/2013 1916   NITRITE NEGATIVE 12/31/2015 0515   LEUKOCYTESUR NEGATIVE 12/31/2015 0515   Sepsis Labs: @LABRCNTIP (procalcitonin:4,lacticidven:4) ) Recent Results (from the past 240 hour(s))  Resp Panel by RT-PCR (Flu A&B, Covid) Nasopharyngeal Swab     Status: None   Collection Time: 07/15/20  2:22 PM   Specimen: Nasopharyngeal Swab; Nasopharyngeal(NP) swabs in vial transport medium  Result Value Ref Range Status   SARS Coronavirus 2 by RT PCR NEGATIVE NEGATIVE Final    Comment: (NOTE) SARS-CoV-2 target nucleic acids are NOT DETECTED.  The SARS-CoV-2 RNA is generally detectable in upper respiratory specimens during the acute phase of infection. The lowest concentration of SARS-CoV-2 viral copies this assay can detect is 138 copies/mL. A negative result does not preclude SARS-Cov-2 infection and should not be used as the sole basis for treatment or other patient management decisions. A negative result may occur with  improper specimen collection/handling, submission of specimen other than nasopharyngeal swab, presence of viral mutation(s) within the areas targeted by this assay, and inadequate number of viral copies(<138 copies/mL). A negative result must be combined with clinical observations, patient history, and epidemiological information.  The expected result is Negative.  Fact Sheet for Patients:  EntrepreneurPulse.com.au  Fact Sheet for Healthcare Providers:  IncredibleEmployment.be  This test is no t yet approved or cleared by the [place] FDA and  has been authorized for detection and/or diagnosis of SARS-CoV-2 by FDA under an Emergency Use Authorization (EUA). This EUA will remain  in effect (meaning this test can be used) for the duration of the COVID-19 declaration under Section 564(b)(1) of the Act, 21 U.S.C.section 360bbb-3(b)(1), unless the authorization is terminated  or revoked sooner.       Influenza A by PCR NEGATIVE NEGATIVE Final   Influenza B by PCR NEGATIVE NEGATIVE Final    Comment: (NOTE) The Xpert Xpress SARS-CoV-2/FLU/RSV plus assay is intended as an aid in the diagnosis of influenza from Nasopharyngeal swab specimens and should not be used as a sole basis for treatment. Nasal washings and aspirates are unacceptable for Xpert Xpress SARS-CoV-2/FLU/RSV testing.  Fact Sheet for Patients: EntrepreneurPulse.com.au  Fact Sheet for Healthcare Providers: IncredibleEmployment.be  This test is not yet approved or cleared by the [place] FDA and has been authorized for detection and/or diagnosis of SARS-CoV-2 by FDA under an Emergency Use Authorization (EUA). This EUA will remain in effect (meaning this test can be used) for the duration of the COVID-19 declaration under Section 564(b)(1) of the Act, 21 U.S.C. section 360bbb-3(b)(1), unless the authorization is terminated or revoked.  Performed at [facility], [place] [address] [place], [place] [address]      Radiological Exams on Admission: No results found.  EKG: Independently reviewed.  Normal sinus rhythm.  Assessment/Plan Principal Problem:   Lung mass Active Problems:   Obstructive sleep apnea   Diabetes mellitus with complication (HCC)    Essential hypertension   ILD (interstitial lung disease) ([place])   End-stage renal disease on hemodialysis (HCC)   Pneumonia    1. Lung mass differentials including possible lung abscess versus pneumonia versus malignancy.  Appreciate pulmonary consult.  Patient is on empiric antibiotics for possible pneumonia versus lung abscess.  Plan is to eventually transfer to [facility] when bed available.  Dr. Lamonte Sakai pulmonologist has been in contact with the transfer center and transplant specialist. 2. Lung transplant  for interstitial lung disease presently on Imuran and cyclosporine and also takes Valcyte and Bactrim.  Patient states he has been weaned off the prednisone. 3. ESRD on hemodialysis on Monday Wednesday Friday.  Will consult nephrology for dialysis. 4. Anemia likely from ESRD follow CBC. 5. Diabetes mellitus type 2 we will keep patient on sliding scale coverage. 6. Sleep apnea on CPAP at bedtime.   DVT prophylaxis: Heparin. Code Status: Full code. Family Communication: Discussed with patient. Disposition Plan: Home. Consults called: Pulmonologist. Admission status: Observation.   Rise Patience MD [facility] Pager [phone].  If 7PM-7AM, please contact night-coverage [email or web address] Password [facility]  07/17/2020, 9:14 PM

## 2020-07-17 NOTE — ED Triage Notes (Signed)
Pt from PCP office with ems for further evaluation of sob. Pt seen here on 2/9 for the same but pt reports worsening SOB. RR labored, tachypnea noted. Pt 100% on room air but supplemented with 2L [place] for comfort. Hx of right lung transplant, mass noted on left lung with fibrosis.

## 2020-07-17 NOTE — Progress Notes (Signed)
Pharmacy Antibiotic Note  [name] is a 70 y.o. male admitted on 07/17/2020 with lung mass vs PNA. The patient has history of a lung transplant and is on immunosuppressive medications.  Pharmacy has been consulted for Zosyn dosing.  The patient is ESRD-MWF  Plan: - Zosyn 3.375g IV x 1 followed by 2.25g IV every 8 hours - Will continue to follow HD schedule/duration, culture results, LOT, and antibiotic de-escalation plans      Temp (24hrs), Avg:98.4 F (36.9 C), Min:98.4 F (36.9 C), Max:98.4 F (36.9 C)  Recent Labs  Lab 07/15/20 1354 07/17/20 1320  WBC 3.4* 3.8*  CREATININE 3.89* 3.54*    CrCl cannot be calculated (Unknown ideal weight.).    Allergies  Allergen Reactions  . Levofloxacin Other (See Comments)    LOSS OF CONSCIOUSNESS  . Nsaids Other (See Comments)    Patient instructed not to take NSAID's after his lung transplant    Antimicrobials this admission: Zosyn 2/11 >>  Dose adjustments this admission: n/a  Microbiology results: 2/11 RCx >> 2/11 BCx >>  Thank you for allowing pharmacy to be a part of this patient's care.  Alycia Rossetti, PharmD, BCPS Clinical Pharmacist Clinical phone for 07/17/2020: [phone] 07/17/2020 5:41 PM   **Pharmacist phone directory can now be found on [email or web address] ([email or web address]).  Listed under [place].

## 2020-07-17 NOTE — ED Notes (Signed)
Pt refused to put on a gown because he said cold. I hooked him up to a monitor and put warm blankets on the pt

## 2020-07-17 NOTE — ED Provider Notes (Signed)
Patient signed out to me pending pulmonary consult.  I discussed the case with the pulmonary team, recommending inpatient care.  Pulmonary had spoken with [facility] transplant team and they had recommended transfer to their facility but they hospital was full.  Pulmonary team had also added additional sputum studies.  I spoke with [facility] transfer center, they state that they are full and cannot accept any additional transfers.  The patient was placed on their waiting list to call if a bed opens up.  In the meanwhile I called the hospitalist team to put in admission orders here.   Luna Fuse, MD 07/17/20 1929

## 2020-07-18 ENCOUNTER — Inpatient Hospital Stay (HOSPITAL_COMMUNITY): Payer: Medicare Other

## 2020-07-18 ENCOUNTER — Observation Stay (HOSPITAL_COMMUNITY): Payer: Medicare Other

## 2020-07-18 DIAGNOSIS — Z87891 Personal history of nicotine dependence: Secondary | ICD-10-CM | POA: Diagnosis not present

## 2020-07-18 DIAGNOSIS — J849 Interstitial pulmonary disease, unspecified: Secondary | ICD-10-CM

## 2020-07-18 DIAGNOSIS — E1122 Type 2 diabetes mellitus with diabetic chronic kidney disease: Secondary | ICD-10-CM | POA: Diagnosis present

## 2020-07-18 DIAGNOSIS — Z7952 Long term (current) use of systemic steroids: Secondary | ICD-10-CM | POA: Diagnosis not present

## 2020-07-18 DIAGNOSIS — Z942 Lung transplant status: Secondary | ICD-10-CM | POA: Diagnosis not present

## 2020-07-18 DIAGNOSIS — Z992 Dependence on renal dialysis: Secondary | ICD-10-CM | POA: Diagnosis not present

## 2020-07-18 DIAGNOSIS — J984 Other disorders of lung: Secondary | ICD-10-CM | POA: Diagnosis not present

## 2020-07-18 DIAGNOSIS — Z9181 History of falling: Secondary | ICD-10-CM | POA: Diagnosis not present

## 2020-07-18 DIAGNOSIS — J8410 Pulmonary fibrosis, unspecified: Secondary | ICD-10-CM | POA: Diagnosis not present

## 2020-07-18 DIAGNOSIS — R0603 Acute respiratory distress: Secondary | ICD-10-CM | POA: Diagnosis not present

## 2020-07-18 DIAGNOSIS — R0902 Hypoxemia: Secondary | ICD-10-CM | POA: Diagnosis not present

## 2020-07-18 DIAGNOSIS — Z79899 Other long term (current) drug therapy: Secondary | ICD-10-CM | POA: Diagnosis not present

## 2020-07-18 DIAGNOSIS — R0602 Shortness of breath: Secondary | ICD-10-CM | POA: Diagnosis not present

## 2020-07-18 DIAGNOSIS — J90 Pleural effusion, not elsewhere classified: Secondary | ICD-10-CM | POA: Diagnosis not present

## 2020-07-18 DIAGNOSIS — I10 Essential (primary) hypertension: Secondary | ICD-10-CM

## 2020-07-18 DIAGNOSIS — D849 Immunodeficiency, unspecified: Secondary | ICD-10-CM | POA: Diagnosis present

## 2020-07-18 DIAGNOSIS — N2581 Secondary hyperparathyroidism of renal origin: Secondary | ICD-10-CM | POA: Diagnosis present

## 2020-07-18 DIAGNOSIS — Z0184 Encounter for antibody response examination: Secondary | ICD-10-CM | POA: Diagnosis not present

## 2020-07-18 DIAGNOSIS — C3412 Malignant neoplasm of upper lobe, left bronchus or lung: Secondary | ICD-10-CM | POA: Diagnosis present

## 2020-07-18 DIAGNOSIS — Z800 Family history of malignant neoplasm of digestive organs: Secondary | ICD-10-CM | POA: Diagnosis not present

## 2020-07-18 DIAGNOSIS — J851 Abscess of lung with pneumonia: Secondary | ICD-10-CM | POA: Diagnosis present

## 2020-07-18 DIAGNOSIS — R918 Other nonspecific abnormal finding of lung field: Secondary | ICD-10-CM | POA: Diagnosis not present

## 2020-07-18 DIAGNOSIS — G4733 Obstructive sleep apnea (adult) (pediatric): Secondary | ICD-10-CM | POA: Diagnosis present

## 2020-07-18 DIAGNOSIS — R0600 Dyspnea, unspecified: Secondary | ICD-10-CM | POA: Diagnosis not present

## 2020-07-18 DIAGNOSIS — Z881 Allergy status to other antibiotic agents status: Secondary | ICD-10-CM | POA: Diagnosis not present

## 2020-07-18 DIAGNOSIS — Z20822 Contact with and (suspected) exposure to covid-19: Secondary | ICD-10-CM | POA: Diagnosis present

## 2020-07-18 DIAGNOSIS — J9601 Acute respiratory failure with hypoxia: Secondary | ICD-10-CM

## 2020-07-18 DIAGNOSIS — D631 Anemia in chronic kidney disease: Secondary | ICD-10-CM | POA: Diagnosis present

## 2020-07-18 DIAGNOSIS — Z7982 Long term (current) use of aspirin: Secondary | ICD-10-CM | POA: Diagnosis not present

## 2020-07-18 DIAGNOSIS — N250 Renal osteodystrophy: Secondary | ICD-10-CM | POA: Diagnosis not present

## 2020-07-18 DIAGNOSIS — I4510 Unspecified right bundle-branch block: Secondary | ICD-10-CM | POA: Diagnosis not present

## 2020-07-18 DIAGNOSIS — Z888 Allergy status to other drugs, medicaments and biological substances status: Secondary | ICD-10-CM | POA: Diagnosis not present

## 2020-07-18 DIAGNOSIS — J9811 Atelectasis: Secondary | ICD-10-CM | POA: Diagnosis not present

## 2020-07-18 DIAGNOSIS — R069 Unspecified abnormalities of breathing: Secondary | ICD-10-CM | POA: Diagnosis not present

## 2020-07-18 DIAGNOSIS — I120 Hypertensive chronic kidney disease with stage 5 chronic kidney disease or end stage renal disease: Secondary | ICD-10-CM | POA: Diagnosis present

## 2020-07-18 DIAGNOSIS — N186 End stage renal disease: Secondary | ICD-10-CM | POA: Diagnosis present

## 2020-07-18 DIAGNOSIS — Z8249 Family history of ischemic heart disease and other diseases of the circulatory system: Secondary | ICD-10-CM | POA: Diagnosis not present

## 2020-07-18 DIAGNOSIS — Z794 Long term (current) use of insulin: Secondary | ICD-10-CM | POA: Diagnosis not present

## 2020-07-18 LAB — COMPREHENSIVE METABOLIC PANEL
ALT: 17 U/L (ref 0–44)
AST: 30 U/L (ref 15–41)
Albumin: 2.3 g/dL — ABNORMAL LOW (ref 3.5–5.0)
Alkaline Phosphatase: 327 U/L — ABNORMAL HIGH (ref 38–126)
Anion gap: 12 (ref 5–15)
BUN: 20 mg/dL (ref 8–23)
CO2: 29 mmol/L (ref 22–32)
Calcium: 7.9 mg/dL — ABNORMAL LOW (ref 8.9–10.3)
Chloride: 99 mmol/L (ref 98–111)
Creatinine, Ser: 5.16 mg/dL — ABNORMAL HIGH (ref 0.61–1.24)
GFR, Estimated: 11 mL/min — ABNORMAL LOW (ref >60)
Glucose, Bld: 72 mg/dL (ref 70–99)
Potassium: 4.2 mmol/L (ref 3.5–5.1)
Sodium: 140 mmol/L (ref 135–145)
Total Bilirubin: 1.1 mg/dL (ref 0.3–1.2)
Total Protein: 5.7 g/dL — ABNORMAL LOW (ref 6.5–8.1)

## 2020-07-18 LAB — MAGNESIUM: Magnesium: 2 mg/dL (ref 1.7–2.4)

## 2020-07-18 LAB — CBG MONITORING, ED
Glucose-Capillary: 77 mg/dL (ref 70–99)
Glucose-Capillary: 72 mg/dL (ref 70–99)

## 2020-07-18 LAB — HEMOGLOBIN A1C
Hgb A1c MFr Bld: 5.4 % (ref 4.8–5.6)
Mean Plasma Glucose: 108.28 mg/dL

## 2020-07-18 LAB — CBC
HCT: 31.4 % — ABNORMAL LOW (ref 39.0–52.0)
Hemoglobin: 10.4 g/dL — ABNORMAL LOW (ref 13.0–17.0)
MCH: 35.3 pg — ABNORMAL HIGH (ref 26.0–34.0)
MCHC: 33.1 g/dL (ref 30.0–36.0)
MCV: 106.4 fL — ABNORMAL HIGH (ref 80.0–100.0)
Platelets: 201 10*3/uL (ref 150–400)
RBC: 2.95 MIL/uL — ABNORMAL LOW (ref 4.22–5.81)
RDW: 15.4 % (ref 11.5–15.5)
WBC: 3.2 10*3/uL — ABNORMAL LOW (ref 4.0–10.5)
nRBC: 0 % (ref 0.0–0.2)

## 2020-07-18 LAB — GLUCOSE, CAPILLARY: Glucose-Capillary: 119 mg/dL — ABNORMAL HIGH (ref 70–99)

## 2020-07-18 LAB — PROCALCITONIN: Procalcitonin: 1.13 ng/mL

## 2020-07-18 LAB — MRSA PCR SCREENING: MRSA by PCR: NEGATIVE

## 2020-07-18 MED ORDER — INSULIN ASPART 100 UNIT/ML ~~LOC~~ SOLN: 0.0000 [IU] | Freq: Three times a day (TID) | SUBCUTANEOUS | Status: DC

## 2020-07-18 MED ORDER — VANCOMYCIN HCL 1500 MG/300ML IV SOLN
1500.0000 mg | Freq: Once | INTRAVENOUS | Status: AC
  Administered 2020-07-18: 1500 mg via INTRAVENOUS
  Filled 2020-07-18: qty 300

## 2020-07-18 MED ORDER — IOHEXOL 350 MG/ML SOLN
100.0000 mL | Freq: Once | INTRAVENOUS | Status: AC | PRN
  Administered 2020-07-18: 100 mL via INTRAVENOUS

## 2020-07-18 MED ORDER — MORPHINE SULFATE (PF) 2 MG/ML IV SOLN
2.0000 mg | INTRAVENOUS | Status: DC | PRN
  Administered 2020-07-19: 2 mg via INTRAVENOUS
  Filled 2020-07-18: qty 1

## 2020-07-18 MED ORDER — CHLORHEXIDINE GLUCONATE CLOTH 2 % EX PADS
6.0000 | MEDICATED_PAD | Freq: Every day | CUTANEOUS | Status: DC
  Administered 2020-07-18 – 2020-07-21 (×4): 6 via TOPICAL

## 2020-07-18 MED ORDER — PIPERACILLIN-TAZOBACTAM 3.375 G IVPB
3.3750 g | Freq: Two times a day (BID) | INTRAVENOUS | Status: DC
  Administered 2020-07-18 – 2020-07-19 (×3): 3.375 g via INTRAVENOUS
  Filled 2020-07-18 (×4): qty 50

## 2020-07-18 MED ORDER — VANCOMYCIN HCL IN DEXTROSE 750-5 MG/150ML-% IV SOLN: 750.0000 mg | INTRAVENOUS | Status: DC

## 2020-07-18 NOTE — Progress Notes (Signed)
PROGRESS NOTE    [name]  STM:[id number] DOB: 02/21/1951 DOA: 07/17/2020 PCP: Wenda Low, MD    Brief Narrative:   70 y.o. male with history of interstitial lung disease status post single lung transplant in 2016 at [facility] presents to the ER with complaints of increasing shortness of breath ongoing for the last 2 weeks.  Patient also had a recent fall on ice about 2 weeks ago.  Patient states he has minimal productive cough.  Shortness of breath increased on exertion.  Mild chest pain on the left upper chest wall around the inferior aspect of the clavicle.  ED Course: In the ER patient underwent CT chest which shows left upper lobe masslike consolidation for which pulmonary consult was requested  Assessment & Plan:   Principal Problem:   Lung mass Active Problems:   Obstructive sleep apnea   Diabetes mellitus with complication (HCC)   Essential hypertension   ILD (interstitial lung disease) ([place])   End-stage renal disease on hemodialysis ([place])   Pneumonia  1. Lung mass differentials including possible lung abscess versus pneumonia versus malignancy.   1. Pulmonary consulted and is following 2. Continued on empiric antibiotics for possible infectious process 3. Per Pulmonary, eventual transfer to [facility] when bed available.  Dr. Lamonte Sakai pulmonologist has been in contact with the transfer center and transplant specialist. 4. Cont O2 as needed 2. Lung transplant for interstitial lung disease presently on Imuran and cyclosporine and also takes Valcyte and Bactrim.   1. Pt reported that he has been weaned off the prednisone. 3. ESRD on hemodialysis 1. Dialyzes on Monday Wednesday Friday.   2. Will consult nephrology for dialysis if transfer cannot be secured this weekend 4. Chronic Anemia 1. likely from ESRD 2. Follow CBC trends 5. Diabetes mellitus type 2 1. Continue on SSI coverage as needed 6. Sleep apnea on CPAP at  bedtime.  DVT prophylaxis: Heparin subq Code Status: Full Family Communication: Pt in room, family not at bedside currently  Status is: Observation  The patient will require care spanning > 2 midnights and should be moved to inpatient because: Unsafe d/c plan, IV treatments appropriate due to intensity of illness or inability to take PO and Inpatient level of care appropriate due to severity of illness  Dispo: The patient is from: Home              Anticipated d/c is to: Unclear at this time, as transfer is also being sought              Anticipated d/c date is: 3 days              Patient currently is not medically stable to d/c.   Difficult to place patient No       Consultants:   Pulmonary  Procedures:     Antimicrobials: Anti-infectives (From admission, onward)   Start     Dose/Rate Route Frequency Ordered Stop   07/20/20 1200  vancomycin (VANCOCIN) IVPB 750 mg/150 ml premix        750 mg 150 mL/hr over 60 Minutes Intravenous Every M-W-F (Hemodialysis) 07/18/20 0527     07/18/20 0600  piperacillin-tazobactam (ZOSYN) IVPB 3.375 g       Note to Pharmacy: Zosyn 3.375 g IV q12h in ESRD on HD   3.375 g 12.5 mL/hr over 240 Minutes Intravenous Every 12 hours 07/18/20 0009     07/18/20 0600  vancomycin (VANCOREADY) IVPB 1500 mg/300 mL  1,500 mg 150 mL/hr over 120 Minutes Intravenous  Once 07/18/20 0527 07/18/20 1139   07/18/20 0200  piperacillin-tazobactam (ZOSYN) IVPB 2.25 g  Status:  Discontinued        2.25 g 100 mL/hr over 30 Minutes Intravenous Every 8 hours 07/17/20 1710 07/18/20 0008   07/17/20 2359  sulfamethoxazole-trimethoprim (BACTRIM) 400-80 MG per tablet 1 tablet       Note to Pharmacy: On Monday, Wednesday, Friday (dialysis days): take one tablet by mouth daily with supper     1 tablet Oral Every M-W-F (1800) 07/17/20 2113     07/17/20 2359  valGANciclovir (VALCYTE) 450 MG tablet TABS 450 mg  Status:  Discontinued       Note to Pharmacy: Take one  tablet (450 mg) by mouth on Monday and Friday after dialysis     450 mg Oral Every M-W-F (1800) 07/17/20 2113 07/17/20 2340   07/17/20 2359  valGANciclovir (VALCYTE) 450 MG tablet TABS 450 mg       Note to Pharmacy: Take one tablet (450 mg) by mouth on Monday and Friday after dialysis     450 mg Oral Once per day on Mon Fri 07/17/20 2340     07/17/20 1715  piperacillin-tazobactam (ZOSYN) IVPB 3.375 g        3.375 g 100 mL/hr over 30 Minutes Intravenous  Once 07/17/20 1709 07/17/20 2034       Subjective: Reports feeling sob this AM, improved with lying flat  Objective: Vitals:   07/18/20 0850 07/18/20 1151 07/18/20 1400 07/18/20 1514  BP: 120/67 (!) 149/94 133/63 133/80  Pulse: 81 80  80  Resp: 20 18 15 16   Temp: 97.7 F (36.5 C)  97.8 F (36.6 C) 98.3 F (36.8 C)  TempSrc: Oral  Oral Oral  SpO2: 100% 100% 100% 99%    Intake/Output Summary (Last 24 hours) at 07/18/2020 1530 Last data filed at 07/18/2020 1139 Gross per 24 hour  Intake 300 ml  Output 1 ml  Net 299 ml   There were no vitals filed for this visit.  Examination: General exam: Awake, laying in bed, in nad Respiratory system: Increased respiratory effort, no wheezing Cardiovascular system: regular rate, s1, s2 Gastrointestinal system: Soft, nondistended, positive BS Central nervous system: CN2-12 grossly intact, strength intact Extremities: Perfused, no clubbing Skin: Normal skin turgor, no notable skin lesions seen Psychiatry: Mood normal // no visual hallucinations   Data Reviewed: I have personally reviewed following labs and imaging studies  CBC: Recent Labs  Lab 07/15/20 1354 07/17/20 1320 07/18/20 0553  WBC 3.4* 3.8* 3.2*  HGB 12.5* 12.3* 10.4*  HCT 40.9 38.2* 31.4*  MCV 112.4* 107.9* 106.4*  PLT 168 223 409   Basic Metabolic Panel: Recent Labs  Lab 07/15/20 1354 07/17/20 1320 07/18/20 0553  NA 137 139 140  K 4.4 4.0 4.2  CL 95* 96* 99  CO2 23 26 29   GLUCOSE 144* 128* 72  BUN 8 11  20   CREATININE 3.89* 3.54* 5.16*  CALCIUM 8.9 8.6* 7.9*  MG  --   --  2.0   GFR: CrCl cannot be calculated (Unknown ideal weight.). Liver Function Tests: Recent Labs  Lab 07/18/20 0553  AST 30  ALT 17  ALKPHOS 327*  BILITOT 1.1  PROT 5.7*  ALBUMIN 2.3*   No results for input(s): LIPASE, AMYLASE in the last 168 hours. No results for input(s): AMMONIA in the last 168 hours. Coagulation Profile: No results for input(s): INR, PROTIME in the last 168 hours.  Cardiac Enzymes: No results for input(s): CKTOTAL, CKMB, CKMBINDEX, TROPONINI in the last 168 hours. BNP (last 3 results) No results for input(s): PROBNP in the last 8760 hours. HbA1C: Recent Labs    07/18/20 0553  HGBA1C 5.4   CBG: Recent Labs  Lab 07/18/20 0816 07/18/20 1155  GLUCAP 77 72   Lipid Profile: No results for input(s): CHOL, HDL, LDLCALC, TRIG, CHOLHDL, LDLDIRECT in the last 72 hours. Thyroid Function Tests: No results for input(s): TSH, T4TOTAL, FREET4, T3FREE, THYROIDAB in the last 72 hours. Anemia Panel: No results for input(s): VITAMINB12, FOLATE, FERRITIN, TIBC, IRON, RETICCTPCT in the last 72 hours. Sepsis Labs: Recent Labs  Lab 07/17/20 1704 07/18/20 0553  PROCALCITON 1.13 1.13    Recent Results (from the past 240 hour(s))  Resp Panel by RT-PCR (Flu A&B, Covid) Nasopharyngeal Swab     Status: None   Collection Time: 07/15/20  2:22 PM   Specimen: Nasopharyngeal Swab; Nasopharyngeal(NP) swabs in vial transport medium  Result Value Ref Range Status   SARS Coronavirus 2 by RT PCR NEGATIVE NEGATIVE Final    Comment: (NOTE) SARS-CoV-2 target nucleic acids are NOT DETECTED.  The SARS-CoV-2 RNA is generally detectable in upper respiratory specimens during the acute phase of infection. The lowest concentration of SARS-CoV-2 viral copies this assay can detect is 138 copies/mL. A negative result does not preclude SARS-Cov-2 infection and should not be used as the sole basis for treatment  or other patient management decisions. A negative result may occur with  improper specimen collection/handling, submission of specimen other than nasopharyngeal swab, presence of viral mutation(s) within the areas targeted by this assay, and inadequate number of viral copies(<138 copies/mL). A negative result must be combined with clinical observations, patient history, and epidemiological information. The expected result is Negative.  Fact Sheet for Patients:  EntrepreneurPulse.com.au  Fact Sheet for Healthcare Providers:  IncredibleEmployment.be  This test is no t yet approved or cleared by the [place] FDA and  has been authorized for detection and/or diagnosis of SARS-CoV-2 by FDA under an Emergency Use Authorization (EUA). This EUA will remain  in effect (meaning this test can be used) for the duration of the COVID-19 declaration under Section 564(b)(1) of the Act, 21 U.S.C.section 360bbb-3(b)(1), unless the authorization is terminated  or revoked sooner.       Influenza A by PCR NEGATIVE NEGATIVE Final   Influenza B by PCR NEGATIVE NEGATIVE Final    Comment: (NOTE) The Xpert Xpress SARS-CoV-2/FLU/RSV plus assay is intended as an aid in the diagnosis of influenza from Nasopharyngeal swab specimens and should not be used as a sole basis for treatment. Nasal washings and aspirates are unacceptable for Xpert Xpress SARS-CoV-2/FLU/RSV testing.  Fact Sheet for Patients: EntrepreneurPulse.com.au  Fact Sheet for Healthcare Providers: IncredibleEmployment.be  This test is not yet approved or cleared by the [place] FDA and has been authorized for detection and/or diagnosis of SARS-CoV-2 by FDA under an Emergency Use Authorization (EUA). This EUA will remain in effect (meaning this test can be used) for the duration of the COVID-19 declaration under Section 564(b)(1) of the Act, 21 U.S.C. section  360bbb-3(b)(1), unless the authorization is terminated or revoked.  Performed at [facility], [place] [address], [place], [place] [address]   MRSA PCR Screening     Status: None   Collection Time: 07/18/20  5:57 AM   Specimen: Nasopharyngeal  Result Value Ref Range Status   MRSA by PCR NEGATIVE NEGATIVE Final    Comment:  The GeneXpert MRSA Assay (FDA approved for NASAL specimens only), is one component of a comprehensive MRSA colonization surveillance program. It is not intended to diagnose MRSA infection nor to guide or monitor treatment for MRSA infections. Performed at [facility], [place] [address] [place], [place] [address]      Radiology Studies: DG CHEST PORT 1 VIEW  Result Date: 07/18/2020 CLINICAL DATA:  70 year old male with possible pneumonia. Left apical masslike opacity. History of lung transplant. EXAM: PORTABLE CHEST 1 VIEW COMPARISON:  Chest CT 07/15/2020 and earlier. FINDINGS: Portable AP semi upright view at 0510 hours. Similar patient rotation to the left. Stable superior mediastinal vascular stent. Coarse pulmonary reticulonodular density throughout the left lung in keeping with the fibrosis seen on CT. Confluent left apical opacity persists. No pneumothorax. Stable cardiac size and mediastinal contours. Right lung appears stable and negative. Stable visualized osseous structures. IMPRESSION: 1. Stable from the chest CT 05/14/2021 (please see that report). Mass-like left apical opacity superimposed on chronic fibrotic left lung disease. 2. Negative right lung. Electronically Signed   By: Genevie Ann M.D.   On: 07/18/2020 06:01    Scheduled Meds: . Alpha Lipoic Acid  200-400 mg Oral See admin instructions  . aspirin EC  81 mg Oral Q breakfast  . azaTHIOprine  50 mg Oral Q M,W,F-1800  . calcium acetate  667 mg Oral TID WC  . Chlorhexidine Gluconate Cloth  6 each Topical Daily  . cycloSPORINE modified  100 mg Oral QHS  . cycloSPORINE modified  75 mg  Oral Daily  . dorzolamide-timolol  1 drop Both Eyes BID  . [START ON 07/20/2020] ferrous sulfate  325 mg Oral Q M,W,F   And  . ferrous sulfate  325 mg Oral Q T,Th,S,Su  . guaiFENesin  1,200 mg Oral BID  . heparin  5,000 Units Subcutaneous Q8H  . insulin aspart  0-6 Units Subcutaneous TID WC  . [START ON 07/20/2020] midodrine  10 mg Oral Q M,W,F  . pantoprazole  40 mg Oral Daily  . pravastatin  10 mg Oral QHS  . sulfamethoxazole-trimethoprim  1 tablet Oral Q M,W,F-1800  . valGANciclovir  450 mg Oral Once per day on Mon Fri  . vitamin B-6  25 mg Oral Q breakfast   Continuous Infusions: . piperacillin-tazobactam (ZOSYN)  IV Stopped (07/18/20 0951)  . [START ON 07/20/2020] vancomycin       LOS: 0 days   Marylu Lund, MD [facility] Pager On Amion  If 7PM-7AM, please contact night-coverage 07/18/2020, 3:30 PM

## 2020-07-18 NOTE — Progress Notes (Addendum)
Pharmacy Antibiotic Note  [name] is a 70 y.o. male with ESRD on HD admitted on 07/17/2020 with lung mass, possible PNA.  Pharmacy has been consulted for Vancomycin  dosing.  Plan: Vancomycin 1500 mg IV now, then 750 mg IV after each HD Change Zosyn 3.375 g IV q12h     Temp (24hrs), Avg:98.5 F (36.9 C), Min:98.4 F (36.9 C), Max:98.6 F (37 C)  Recent Labs  Lab 07/15/20 1354 07/17/20 1320  WBC 3.4* 3.8*  CREATININE 3.89* 3.54*    CrCl cannot be calculated (Unknown ideal weight.).    Allergies  Allergen Reactions  . Levofloxacin Other (See Comments)    LOSS OF CONSCIOUSNESS  . Nsaids Other (See Comments)    Patient instructed not to take NSAID's after his lung transplant     Caryl Pina 07/18/2020 5:23 AM

## 2020-07-18 NOTE — Progress Notes (Signed)
NAME:  [name], MRN:  [id number], DOB:  [birth year], LOS: 0 ADMISSION DATE:  07/17/2020, CONSULTATION DATE:  07/17/2021 REFERRING MD: Langston Masker , CHIEF COMPLAINT:  New Lung mass, worsening dyspnea in setting of ILD, Lung Transplant 2016   Brief History:  70 year old male significant history of pulmonary fibrosis, diabetes, hypertension, end-stage renal disease currently on dialysis ( No missed treatments), lung transplant  (2016 at [facility] immunosuppresive medications, ESRD on MWF dialysis  Presenting 2/11  for evaluation of worsening dyspnea in setting of ILD, new lung mass. PCCM have been asked to see as a pulmonary consult. Of note, patient was seen in [facility] ED 2/9 for dyspnea and chest pain in the setting of a mechanical fall.   History of Present Illness:  70 year old male significant history of pulmonary fibrosis, diabetes, hypertension, end-stage renal disease currently on dialysis ( No missed treatments), right lung transplant  (2016 at [facility]) on immunosuppresive medications, ESRD on MWF dialysis  Presenting 2/11  for evaluation of worsening dyspnea in setting of ILD, new lung mass. The change in his breathing was noted 2-3 weeks ago. He does not endorse any fever,hemoptysis or new lower extremity edema. Marland Kitchen  PCCM have been asked to see as a pulmonary consult. Of note, patient was seen in [facility] ED 2/9 for dyspnea and chest pain in the setting of a mechanical fall. He was discharged home. In the ED patient is on RA and sats are 100% at rest. He states his dyspnea is with minimal exertion, less at rest.. He states he notices his respiratory  rate increases and he gets dizzy and has to sit down. He states he does have oxygen sat monitor at home, but his hands stay so cold he does not get a reading. He does not have home oxygen. Pt. States he is compliant with his suppressive medications, and he does not miss HD. States he feels his dyspnea is worse after HD treatments.  CT Scan is significant for a  New masslike consolidation in the posterior apical left upper lobe measuring 3.9 x 3.6 cm. This is a new , was not seen on imaging 6 months ago. Afebrile, WBC of 3.4, HGB 12.3, platelets 223 Creatinine 3.54  Past Medical History:   Past Medical History:  Diagnosis Date  . Aortic valve disorders   . Benign neoplasm of colon   . Degeneration of intervertebral disc, site unspecified   . Diabetes mellitus without complication ([place])   . 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 Esophageal reflux   . ESRD (end stage renal disease) on dialysis ([place])   . Essential hypertension   . Obstructive sleep apnea (adult) (pediatric)   . Osteoarthrosis, unspecified whether generalized or localized, unspecified site   . Other and unspecified hyperlipidemia   . Pneumonia    interstitial pneumonia  . Pulmonary fibrosis ([place])   . Renal disorder   . Respiratory failure with hypoxia ([place]) 12/2015  . Transplanted, lung ([place])   . 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 PET-CT suggested for further evaluation. Subtle erosive change along the anterior surface of the posterior left third rib, cannot exclude direct tumor involvement. No discrete bone fracture in the  chest. 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 No acute pulmonary disease in the right lung transplant. Cholelithiasis. Morphologic changes in the liver suggestive of cirrhosis. Nonobstructing right nephrolithiasis. Aortic Atherosclerosis (ICD10-I70.0) and Emphysema (ICD10-J43.9).   Micro Data:    Antimicrobials:  07/15/2020  SARS Coronavirus 2 by RT PCR NEGATIVE  NEGATIVE  NEGATIVE CM      Interim History / Subjective:  Currently on RA at rest with sat of 100% Afebrile, WBC 3.4   Objective   Blood pressure 120/67, pulse 81, temperature 97.7 F (36.5 C), temperature source Oral, resp. rate 20, SpO2 100 %.        Intake/Output Summary (Last 24 hours) at 07/18/2020 1043 Last data filed at 07/18/2020 0911 Gross per 24 hour  Intake --  Output 1 ml  Net -1 ml   There were no vitals filed for this visit.  Examination: General: 70 year old male is able to ambulate to bedside commode without issue other than being more short of breath HEENT: No JVD lymphadenopathy is appreciated Neuro: Grossly intact CV: Heart sounds are regular PULM: Coarse rhonchi on the right diminished on the left GI: soft, bsx4 active  GU: Anuretic extremities: warm/dry, 1+ edema  Skin: no rashes or lesions  Resolved Hospital Problem list     Assessment & Plan:  New Masslike consolidation in the posterior apical left upper lobe ( native lung) in immunocompromised patient. ( Atypical Pneumonia vs abscess vs malignancy ) Right Lung Transplant 2016 on immunosuppressive therapy Worsening dyspnea x 2 weeks Plan Transfer to [facility] when available Currently on Zosyn Procalcitonin is reassuring at 1.13 Monitor culture data Sputum culture is able Bronchodilators QuantiFERON gold is pending    Dyspnea/ Hypoxemia with minimal exertion 2/2 above ILD per L native lung Plan Oxygen to maintain sats greater than 92% Pulmonary toilet Mobilize Mucolytic Regular chest x-rays He will need transfer to [facility]   Titrate oxygen for sats > 92% Aggressive pulmonary toilet OOB to chair Flutter valve Mucinex CXR prn Will need walking oxygen saturation prior to d/c home  ESRD Plan Per renal  Will treat as pneumonia, and pulmonary will continue to follow as consult  Lung Transplant Team at [facility]  PT transplant  Coordinator at [facility] >> Carlyon Shadow >> [phone] Fax number they have requested noted be sent to>> [phone]   Best practice (evaluated daily)  Diet: Per Primary Pain/Anxiety/Delirium protocol (if indicated): Per Primary VAP protocol (if indicated): NA DVT prophylaxis: Per Primary team GI prophylaxis: Per Primary Team Glucose control: Per Primary team Mobility: OOB to chair  Disposition:Per Primary team  Goals of Care:  Last date of multidisciplinary goals of care discussion: Family and staff present:  Summary of discussion:  Follow up goals of care discussion due:  Code Status: Full  Labs   CBC: Recent Labs  Lab 07/15/20 1354 07/17/20 1320 07/18/20 0553  WBC 3.4* 3.8* 3.2*  HGB 12.5* 12.3* 10.4*  HCT 40.9 38.2* 31.4*  MCV 112.4* 107.9* 106.4*  PLT 168 223 025    Basic Metabolic Panel: Recent Labs  Lab 07/15/20 1354 07/17/20 1320 07/18/20 0553  NA 137 139 140  K 4.4 4.0 4.2  CL 95* 96* 99  CO2 23 26 29   GLUCOSE 144* 128* 72  BUN 8 11 20   CREATININE 3.89* 3.54* 5.16*  CALCIUM 8.9 8.6* 7.9*  MG  --   --  2.0   GFR: CrCl cannot be calculated (Unknown ideal weight.). Recent Labs  Lab  07/15/20 1354 07/17/20 1320 07/17/20 1704 07/18/20 0553  PROCALCITON  --   --  1.13 1.13  WBC 3.4* 3.8*  --  3.2*    Liver Function Tests: Recent Labs  Lab 07/18/20 0553  AST 30  ALT 17  ALKPHOS 327*  BILITOT 1.1  PROT 5.7*  ALBUMIN 2.3*   No results for input(s): LIPASE, AMYLASE in the last 168 hours. No results for input(s): AMMONIA in the last 168 hours.  ABG    Component Value Date/Time   PHART 7.381 01/01/2016 1843   PCO2ART 42.4 01/01/2016 1843   PO2ART 78.0 (L) 01/01/2016 1843   HCO3 26.8 03/28/2018 1010   TCO2 26 02/05/2019 1552   ACIDBASEDEF 2.0 12/30/2015 0841   O2SAT 53.0 03/28/2018 1010     Coagulation Profile: No results for input(s): INR, PROTIME in the last 168 hours.  Cardiac Enzymes: No results for input(s): CKTOTAL, CKMB,  CKMBINDEX, TROPONINI in the last 168 hours.  HbA1C: Hgb A1c MFr Bld  Date/Time Value Ref Range Status  07/18/2020 05:53 AM 5.4 4.8 - 5.6 % Final    Comment:    (NOTE) Pre diabetes:          5.7%-6.4%  Diabetes:              >6.4%  Glycemic control for   <7.0% adults with diabetes   01/08/2020 10:59 AM 5.8 (H) 4.8 - 5.6 % Final    Comment:    (NOTE) Pre diabetes:          5.7%-6.4%  Diabetes:              >6.4%  Glycemic control for   <7.0% adults with diabetes     CBG: Recent Labs  Lab 07/18/20 0816  GLUCAP 77           [place] Lara Palinkas ACNP Acute Care Nurse Practitioner [place] Please consult Amion 07/18/2020, 10:43 AM

## 2020-07-19 ENCOUNTER — Inpatient Hospital Stay (HOSPITAL_COMMUNITY): Payer: Medicare Other

## 2020-07-19 DIAGNOSIS — R0603 Acute respiratory distress: Secondary | ICD-10-CM | POA: Diagnosis not present

## 2020-07-19 DIAGNOSIS — R0902 Hypoxemia: Secondary | ICD-10-CM | POA: Diagnosis not present

## 2020-07-19 DIAGNOSIS — N186 End stage renal disease: Secondary | ICD-10-CM | POA: Diagnosis not present

## 2020-07-19 DIAGNOSIS — R918 Other nonspecific abnormal finding of lung field: Secondary | ICD-10-CM | POA: Diagnosis not present

## 2020-07-19 DIAGNOSIS — J849 Interstitial pulmonary disease, unspecified: Secondary | ICD-10-CM | POA: Diagnosis not present

## 2020-07-19 DIAGNOSIS — I10 Essential (primary) hypertension: Secondary | ICD-10-CM | POA: Diagnosis not present

## 2020-07-19 LAB — QUANTIFERON-TB GOLD PLUS (RQFGPL)
QuantiFERON Mitogen Value: 0.01 IU/mL
QuantiFERON Nil Value: 0 IU/mL
QuantiFERON TB1 Ag Value: 0 IU/mL
QuantiFERON TB2 Ag Value: 0 IU/mL

## 2020-07-19 LAB — ECHOCARDIOGRAM COMPLETE
AR max vel: 1.52 cm2
AV Area VTI: 1.59 cm2
AV Area mean vel: 1.44 cm2
AV Mean grad: 9 mmHg
AV Peak grad: 15.7 mmHg
Ao pk vel: 1.98 m/s
Area-P 1/2: 2.66 cm2
Height: 63 in
S' Lateral: 2 cm
Weight: 2377.44 oz

## 2020-07-19 LAB — RESPIRATORY PANEL BY PCR

## 2020-07-19 LAB — BASIC METABOLIC PANEL
Anion gap: 14 (ref 5–15)
BUN: 32 mg/dL — ABNORMAL HIGH (ref 8–23)
CO2: 28 mmol/L (ref 22–32)
Calcium: 8.2 mg/dL — ABNORMAL LOW (ref 8.9–10.3)
Chloride: 95 mmol/L — ABNORMAL LOW (ref 98–111)
Creatinine, Ser: 7.21 mg/dL — ABNORMAL HIGH (ref 0.61–1.24)
GFR, Estimated: 8 mL/min — ABNORMAL LOW (ref >60)
Glucose, Bld: 89 mg/dL (ref 70–99)
Potassium: 4.7 mmol/L (ref 3.5–5.1)
Sodium: 137 mmol/L (ref 135–145)

## 2020-07-19 LAB — GLUCOSE, CAPILLARY
Glucose-Capillary: 141 mg/dL — ABNORMAL HIGH (ref 70–99)
Glucose-Capillary: 81 mg/dL (ref 70–99)
Glucose-Capillary: 85 mg/dL (ref 70–99)
Glucose-Capillary: 116 mg/dL — ABNORMAL HIGH (ref 70–99)
Glucose-Capillary: 76 mg/dL (ref 70–99)

## 2020-07-19 LAB — QUANTIFERON-TB GOLD PLUS: QuantiFERON-TB Gold Plus: UNDETERMINED — AB

## 2020-07-19 LAB — PROCALCITONIN: Procalcitonin: 1.08 ng/mL

## 2020-07-19 LAB — PHOSPHORUS: Phosphorus: 3.9 mg/dL (ref 2.5–4.6)

## 2020-07-19 LAB — MAGNESIUM: Magnesium: 2 mg/dL (ref 1.7–2.4)

## 2020-07-19 MED ORDER — DOXERCALCIFEROL 4 MCG/2ML IV SOLN
3.0000 ug | INTRAVENOUS | Status: DC
  Filled 2020-07-19: qty 2

## 2020-07-19 MED ORDER — CHLORHEXIDINE GLUCONATE CLOTH 2 % EX PADS
6.0000 | MEDICATED_PAD | Freq: Every day | CUTANEOUS | Status: DC
  Administered 2020-07-20 – 2020-07-21 (×2): 6 via TOPICAL

## 2020-07-19 MED ORDER — PIPERACILLIN-TAZOBACTAM IN DEX 2-0.25 GM/50ML IV SOLN
2.2500 g | Freq: Three times a day (TID) | INTRAVENOUS | Status: DC
  Administered 2020-07-19 – 2020-07-21 (×5): 2.25 g via INTRAVENOUS
  Filled 2020-07-19 (×7): qty 50

## 2020-07-19 MED ORDER — PIPERACILLIN-TAZOBACTAM IN DEX 2-0.25 GM/50ML IV SOLN
2.2500 g | Freq: Three times a day (TID) | INTRAVENOUS | Status: DC
  Filled 2020-07-19 (×2): qty 50

## 2020-07-19 NOTE — Progress Notes (Signed)
NAME:  [name], MRN:  [id number], DOB:  1950/08/24, LOS: 1 ADMISSION DATE:  07/17/2020, CONSULTATION DATE:  07/17/2021 REFERRING MD: Langston Masker , CHIEF COMPLAINT:  New Lung mass, worsening dyspnea in setting of ILD, Lung Transplant 2016   Brief History:  70 year old male significant history of pulmonary fibrosis, diabetes, hypertension, end-stage renal disease currently on dialysis ( No missed treatments), lung transplant  (2016 at [facility] immunosuppresive medications, ESRD on MWF dialysis  Presenting 2/11  for evaluation of worsening dyspnea in setting of ILD, new lung mass. PCCM have been asked to see as a pulmonary consult. Of note, patient was seen in [facility] ED 2/9 for dyspnea and chest pain in the setting of a mechanical fall.   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 The change in his breathing was noted 2-3 weeks ago. He does not endorse any fever,hemoptysis or new lower extremity edema. Marland Kitchen  PCCM have been asked to see as a pulmonary consult. Of note, patient was seen in [facility] ED 2/9 for dyspnea and chest pain in the setting of a mechanical fall. He was discharged home. In the ED patient is on RA and sats are 100% at rest. He states his dyspnea is with minimal exertion, less at rest.. He states he notices his respiratory  rate increases and he gets dizzy and has to sit down. He states he does have oxygen sat monitor at home, but his hands stay so cold he does not get a reading. He does not have home oxygen. Pt. States he is compliant with his suppressive medications, and he does not miss HD. States he feels his dyspnea is worse after HD treatments.  CT Scan is significant for a  New masslike consolidation in the posterior apical left upper lobe measuring 3.9 x 3.6 cm. This is a new , was not seen on imaging 6 months ago. Afebrile, WBC of 3.4, HGB 12.3, platelets 223 Creatinine 3.54  Past Medical History:   Past Medical History:  Diagnosis Date  . Aortic valve disorders   . Benign neoplasm of colon   . Degeneration of intervertebral disc, site unspecified   . Diabetes mellitus without complication ([place])   . 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 Esophageal reflux   . ESRD (end stage renal disease) on dialysis ([place])   . Essential hypertension   . Obstructive sleep apnea (adult) (pediatric)   . Osteoarthrosis, unspecified whether generalized or localized, unspecified site   . Other and unspecified hyperlipidemia   . Pneumonia    interstitial pneumonia  . Pulmonary fibrosis ([place])   . Renal disorder   . Respiratory failure with hypoxia ([place]) 12/2015  . Transplanted, lung ([place])   . 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 PET-CT suggested for further evaluation. Subtle erosive change along the anterior surface of the posterior left third rib, cannot exclude direct tumor involvement. No discrete bone fracture in the  chest. 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 No acute pulmonary disease in the right lung transplant. Cholelithiasis. Morphologic changes in the liver suggestive of cirrhosis. Nonobstructing right nephrolithiasis. Aortic Athero yes never heard of mild left on the weekend  Sclerosis (ICD10-I70.0) and Emphysema (ICD10-J43.9).   Micro Data:    Antimicrobials:   07/15/2020  SARS Coronavirus 2 by RT PCR NEGATIVE NEGATIVE  NEGATIVE CM      Interim History / Subjective:  Currently on room air no acute distress 4   Objective   Blood pressure 135/74, pulse 88, temperature 98.8 F (37.1 C), temperature source Oral, resp. rate 14, height 5\' 3"  (1.6 m), weight 67.4 kg, SpO2 94 %.        Intake/Output Summary (Last 24 hours) at 07/19/2020 1053 Last data filed at 07/19/2020 2426 Gross per 24 hour  Intake 510.28 ml  Output --  Net 510.28 ml   Filed Weights   07/18/20 1624  Weight: 67.4 kg    Examination: General: Currently on room air no acute distress HEENT: MM pink/moist no JVD Neuro: Grossly intact CV: Heart sounds are regular PULM: Diminished in the bases   GI: soft, bsx4 active  Extremities: warm/dry,  edema  Skin: no rashes or lesions   Resolved Hospital Problem list     Assessment & Plan:  New Masslike consolidation in the posterior apical left upper lobe ( native lung) in immunocompromised patient. ( Atypical Pneumonia vs abscess vs malignancy ) Right Lung Transplant 2016 on immunosuppressive therapy Worsening dyspnea x 2 weeks Plan Transfer [facility] when available Continue Zosyn monitor culture  Continue bronchodilators  QuantiFERON gold remains pending      Dyspnea/ Hypoxemia with minimal exertion 2/2 above ILD per L native lung Plan   Currently on room air without distress  Pulmonary toilet  Still awaits transfer to [facility] although he may be well by the time he goes to do       ESRD Plan Per renal Will treat as pneumonia, and pulmonary will continue to follow as consult  Lung Transplant Team at [facility]  PT transplant Coordinator at [facility] >> [place] >> [phone] Fax number they have requested noted be sent to>> [phone]   Best practice (evaluated daily)  Diet: Per Primary Pain/Anxiety/Delirium protocol (if indicated): Per Primary VAP  protocol (if indicated): NA DVT prophylaxis: Per Primary team GI prophylaxis: Per Primary Team Glucose control: Per Primary team Mobility: OOB to chair  Disposition:Per Primary team  Goals of Care:  Last date of multidisciplinary goals of care discussion: Family and staff present:  Summary of discussion:  Follow up goals of care discussion due:  Code Status: Full  Labs   CBC: Recent Labs  Lab 07/15/20 1354 07/17/20 1320 07/18/20 0553  WBC 3.4* 3.8* 3.2*  HGB 12.5* 12.3* 10.4*  HCT 40.9 38.2* 31.4*  MCV 112.4* 107.9* 106.4*  PLT 168 223 740    Basic Metabolic Panel: Recent Labs  Lab 07/15/20 1354 07/17/20 1320 07/18/20 0553 07/19/20 0626  NA 137 139 140 137  K 4.4 4.0 4.2 4.7  CL 95* 96* 99 95*  CO2 23 26 29 28   GLUCOSE 144* 128* 72 89  BUN 8 11 20  32*  CREATININE 3.89* 3.54* 5.16* 7.21*  CALCIUM 8.9 8.6* 7.9* 8.2*  MG  --   --  2.0 2.0  PHOS  --   --   --  3.9   GFR: Estimated Creatinine Clearance: 7.6 mL/min (A) (  by C-G formula based on SCr of 7.21 mg/dL (H)). Recent Labs  Lab 07/15/20 1354 07/17/20 1320 07/17/20 1704 07/18/20 0553 07/19/20 0626  PROCALCITON  --   --  1.13 1.13 1.08  WBC 3.4* 3.8*  --  3.2*  --     Liver Function Tests: Recent Labs  Lab 07/18/20 0553  AST 30  ALT 17  ALKPHOS 327*  BILITOT 1.1  PROT 5.7*  ALBUMIN 2.3*   No results for input(s): LIPASE, AMYLASE in the last 168 hours. No results for input(s): AMMONIA in the last 168 hours.  ABG    Component Value Date/Time   PHART 7.381 01/01/2016 1843   PCO2ART 42.4 01/01/2016 1843   PO2ART 78.0 (L) 01/01/2016 1843   HCO3 26.8 03/28/2018 1010   TCO2 26 02/05/2019 1552   ACIDBASEDEF 2.0 12/30/2015 0841   O2SAT 53.0 03/28/2018 1010     Coagulation Profile: No results for input(s): INR, PROTIME in the last 168 hours.  Cardiac Enzymes: No results for input(s): CKTOTAL, CKMB, CKMBINDEX, TROPONINI in the last 168 hours.  HbA1C: Hgb A1c MFr Bld  Date/Time Value Ref  Range Status  07/18/2020 05:53 AM 5.4 4.8 - 5.6 % Final    Comment:    (NOTE) Pre diabetes:          5.7%-6.4%  Diabetes:              >6.4%  Glycemic control for   <7.0% adults with diabetes   01/08/2020 10:59 AM 5.8 (H) 4.8 - 5.6 % Final    Comment:    (NOTE) Pre diabetes:          5.7%-6.4%  Diabetes:              >6.4%  Glycemic control for   <7.0% adults with diabetes     CBG: Recent Labs  Lab 07/18/20 0816 07/18/20 1155 07/18/20 1611 07/18/20 2120 07/19/20 0612  GLUCAP [address] [place] Ellianah Cordy ACNP Acute Care Nurse Practitioner [place] Please consult Amion 07/19/2020, 10:53 AM

## 2020-07-19 NOTE — Progress Notes (Signed)
   Update  - Got word from Dr Eliseo Squires of [facility] transplant services -that they are working on getting [name] a bed at [facility]. HE reviewed images of the CT scan (says is avail online on "powershare"). He thinks LUL mass in native lung needs biopsy. He got word from a Dr Joni Reining that bed possibly avail tonight . The accepting doc wil be Dr Okey Regal. I have d/w Dr Octavia Bruckner Opyd night doc and shared information about transfer and given his contact to Dr Annamaria Boots via email   Also updated Dr Wyline Copas the day doc for 07/19/2020 and 07/20/20   SIGNATURE    Dr. Brand Males, M.D., F.C.C.P,  Pulmonary and Critical Care Medicine Staff Physician, [place] Director - Interstitial Lung Disease  Program  Pulmonary [place] at [place], [place], [address]  Pager: [phone], If no answer  OR between  19:00-7:00h: page [phone] Telephone (clinical office): [phone] Telephone (research): [phone]  9:53 PM 07/19/2020

## 2020-07-19 NOTE — Consult Note (Signed)
[facility] Renal Consultation Note    Indication for Consultation:  Management of ESRD/hemodialysis, anemia, hypertension/volume, and secondary hyperparathyroidism.  HPI: [name] is a 70 y.o. male with past medical history including ESRD on dialysis Monday Wednesday Friday, coronary fibrosis, status post right lung transplant in 2016, diabetes, hypertension, on immunosuppression, who presented to the ED with worsening shortness of breath.  He had noted more trouble breathing over the past 2 to 3 weeks.  Also had an ED visit prior to this admission for chest pain after a fall, which was felt to be musculoskeletal in nature.  O2 saturations were stable on room air, however CT of the chest showed new masslike consolidation in the left posterior apical upper lung.  Pulmonology was consulted and started patient on empiric antibiotics.  They have been in contact with the [facility] transplant team and ultimate plan is to transfer to [facility] when bed is available. Nephrology has been consulted for management of ESRD.  Today, patient reports his shortness of breath is somewhat improved.  He continues to have left-sided chest pain with deep breaths.  He notes that his shortness of breath has been worse after dialysis lately and he has also been very fatigued.  He denies any fevers, chills, palpitations, dizziness.  Reported nausea this morning and had one episode of emesis, but no nausea at present.  Denies abdominal pain and diarrhea.  No peripheral edema.  Denies any recent issues with dialysis access. Labs notable for K+ 4.7, Cr 7.21, BUN 32, WBC 3.2, Hgb 10.4, Plt 201.   Past Medical History:  Diagnosis Date  . Aortic valve disorders   . Benign neoplasm of colon   . Degeneration of intervertebral disc, site unspecified   . Diabetes mellitus without complication ([place])   . 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 Esophageal reflux   . ESRD (end stage renal  disease) on dialysis ([place])   . Essential hypertension   . Obstructive sleep apnea (adult) (pediatric)   . Osteoarthrosis, unspecified whether generalized or localized, unspecified site   . Other and unspecified hyperlipidemia   . Pneumonia    interstitial pneumonia  . Pulmonary fibrosis ([place])   . Renal disorder   . Respiratory failure with hypoxia ([place]) 12/2015  . Transplanted, lung ([place])   . Unspecified essential hypertension    Past Surgical History:  Procedure Laterality Date  . COLONOSCOPY WITH PROPOFOL N/A 11/07/2017   Procedure: COLONOSCOPY WITH PROPOFOL;  Surgeon: Ronnette Juniper, MD;  Location: [facility];  Service: Gastroenterology;  Laterality: N/A;  . LIGATION OF ARTERIOVENOUS  FISTULA Left 01/14/2019   Procedure: LIGATION OF ARTERIOVENOUS  FISTULA;  Surgeon: Marty Heck, MD;  Location: [facility];  Service: Vascular;  Laterality: Left;  . LUNG BIOPSY  2010  . LUNG TRANSPLANT, SINGLE Right   . POLYPECTOMY  11/07/2017   Procedure: POLYPECTOMY;  Surgeon: Ronnette Juniper, MD;  Location: [facility];  Service: Gastroenterology;;   Family History  Problem Relation Age of Onset  . Pancreatic cancer Brother   . Heart disease Father    Social History:  reports that he quit smoking about 18 years ago. His smoking use included cigarettes. He has a 6.00 pack-year smoking history. He has never used smokeless tobacco. He reports that he does not drink alcohol and does not use drugs.  ROS: As per HPI otherwise negative.  Physical Exam: Vitals:   07/18/20 2202 07/18/20 2330 07/19/20 0412 07/19/20 1208  BP:  Marland Kitchen)  164/95 135/74 121/80  Pulse: 85 85 88 67  Resp: 14 13 14 14   Temp:  97.9 F (36.6 C) 98.8 F (37.1 C) 97.8 F (36.6 C)  TempSrc:  Axillary Oral Oral  SpO2: 90% 100% 94% 96%  Weight:      Height:         General: Well developed, alert, in no acute distress. Head: Normocephalic, atraumatic, sclera non-icteric, mucus membranes are moist. Neck: JVD not elevated. Lungs:  Breathing is unlabored on RA. + expiratory wheeze left upper lobe and decreased breath sounds b/l bases Heart: RRR with normal S1, S2. No murmurs, rubs, or gallops appreciated. Abdomen: Soft, non-tender, non-distended with normoactive bowel sounds. No rebound/guarding. No obvious abdominal masses. Musculoskeletal:  Strength and tone appear normal for age. Lower extremities: No edema bilateral lower extremities Neuro: Alert and oriented X 3. Moves all extremities spontaneously. Psych:  Responds to questions appropriately with a normal affect. Dialysis Access: LUE AVF +bruit  Allergies  Allergen Reactions  . Levofloxacin Other (See Comments)    LOSS OF CONSCIOUSNESS  . Nsaids Other (See Comments)    Patient instructed not to take NSAID's after his lung transplant   Prior to Admission medications   Medication Sig Start Date End Date Taking? Authorizing Provider  acetaminophen (TYLENOL) 500 MG tablet Take 500-1,000 mg by mouth every 6 (six) hours as needed for moderate pain or headache.    Yes [provider]  albuterol (VENTOLIN HFA) 108 (90 Base) MCG/ACT inhaler Inhale 1-2 puffs into the lungs daily as needed for wheezing or shortness of breath. 05/12/20  Yes [provider]  Alpha Lipoic Acid 200 MG CAPS Take 200-400 mg by mouth See admin instructions. On Monday, Wednesday, Friday (dialysis days): take 2 capsules (400 mg) by mouth with breakfast and 1 capsule (200 mg) with supper; on Sunday, Tuesday, Thursday, Saturday (non-dialysis days): take 1 capsule (200 mg) three times daily with meals   Yes [provider]  aspirin 81 MG tablet Take 81 mg by mouth daily with breakfast.   Yes [provider]  azaTHIOprine (IMURAN) 50 MG tablet Take 50 mg by mouth See admin instructions. Take one tablet (50 mg) by mouth on Monday, Wednesday, Friday after dialysis   Yes [provider]  calcium acetate (PHOSLO) 667 MG capsule Take 667 mg by mouth 3 (three) times  daily before meals.    Yes [provider]  cycloSPORINE modified (NEORAL) 25 MG capsule Take 75-100 mg by mouth See admin instructions. Take 3 capsule (75 mg) by mouth daily with breakfast and 4 capsules (100 mg) at bedtime   Yes [provider]  dorzolamidel-timolol (COSOPT PF) 22.3-6.8 MG/ML SOLN ophthalmic solution Place 1 drop into both eyes 2 (two) times daily.   Yes [provider]  fluorometholone (FML) 0.1 % ophthalmic suspension Place 1 drop into the right eye 2 (two) times daily. 04/11/20  Yes [provider]  fluticasone (FLONASE) 50 MCG/ACT nasal spray Place 2 sprays into the nose daily. Patient taking differently: Place 2 sprays into the nose daily as needed for allergies. 03/05/13  Yes Wenda Low, MD  gabapentin (NEURONTIN) 100 MG capsule Take 100 mg by mouth at bedtime. 03/25/20  Yes [provider]  Immune Globulin 10% (PRIVIGEN) 20 GM/200ML SOLN Inject 0.5 g/kg into the vein every 3 (three) months.    Yes [provider]  insulin regular (NOVOLIN R) 100 units/mL injection Inject 5-9 Units into the skin See admin instructions. Inject 5 units  subcutaneously with breakfast and 6 units with lunch and supper; PLUS adjustment per sliding scale: CBG [phone] add 1 unit, 261-360 add 2 units, 361-461 add 3 units   Yes [provider]  midodrine (PROAMATINE) 5 MG tablet Take 5-10 mg by mouth See admin instructions. On Monday, Wednesday, Friday(dialysis days): take 1 tablet (5 mg) by mouth before dialysis, take 1 tablet (5 mg) after dialysis as needed for SBP >90. On Sunday, Tuesday, Thursday, Saturday (non-dialysis days): take 1 tablet (5 mg) by mouth 3 times daily as needed for SBP >90. 11/18/19  Yes [provider]  Multiple Vitamin (MULTIVITAMIN WITH MINERALS) TABS tablet Take 1 tablet by mouth daily. Centrum for Men 50 plus: on Monday, Wednesday, Friday (dialysis days): take one tablet daily with breakfast; on Sunday,  Tuesday, Thursday, Saturday: take one tablet daily with supper   Yes [provider]  omeprazole (PRILOSEC) 40 MG capsule Take 40 mg by mouth 2 (two) times daily with a meal.  11/12/18  Yes [provider]  polyvinyl alcohol-povidone (HYPOTEARS) 1.4-0.6 % ophthalmic solution Place 1 drop into both eyes at bedtime.   Yes [provider]  pravastatin (PRAVACHOL) 10 MG tablet Take 10 mg by mouth at bedtime. 03/16/20  Yes [provider]  sulfamethoxazole-trimethoprim (BACTRIM,SEPTRA) 400-80 MG tablet Take 1 tablet by mouth See admin instructions. On Monday, Wednesday, Friday (dialysis days): take one tablet by mouth daily with supper 02/27/17  Yes [provider]  valGANciclovir (VALCYTE) 450 MG tablet Take 450 mg by mouth See admin instructions. Take one tablet (450 mg) by mouth on Monday, Wednesday, and Friday after dialysis 03/26/15  Yes [provider]  vitamin B-6 (PYRIDOXINE) 25 MG tablet Take 25 mg by mouth daily with breakfast.    Yes [provider]  [place] "As directed up to 4 times daily" 12/16/15   [provider]  diclofenac Sodium (VOLTAREN) 1 % GEL Apply 2 g topically 4 (four) times daily. Patient not taking: Reported on 07/18/2020 07/06/20   Hans Eden, NP  oxyCODONE-acetaminophen (PERCOCET) 5-325 MG tablet Take 1 tablet by mouth every 6 (six) hours as needed for moderate pain or severe pain. Patient not taking: Reported on 07/18/2020 07/15/20   Domenic Moras, PA-C  tizanidine (ZANAFLEX) 2 MG capsule Take 1 capsule (2 mg total) by mouth at bedtime as needed for muscle spasms. Patient not taking: Reported on 07/18/2020 07/06/20   Hans Eden, NP   Current Facility-Administered Medications  Medication Dose Route Frequency Provider Last Rate Last Admin  . acetaminophen (TYLENOL) tablet 650 mg  650 mg Oral Q6H PRN Rise Patience, MD       Or  . acetaminophen (TYLENOL) suppository 650 mg   650 mg Rectal Q6H PRN Rise Patience, MD      . albuterol (VENTOLIN HFA) 108 (90 Base) MCG/ACT inhaler 1-2 puff  1-2 puff Inhalation Q4H PRN Rise Patience, MD      . Alpha Lipoic Acid CAPS 200-400 mg  200-400 mg Oral See admin instructions Rise Patience, MD      . aspirin EC tablet 81 mg  81 mg Oral Q breakfast Rise Patience, MD   81 mg at 07/19/20 0846  . azaTHIOprine (IMURAN) tablet 50 mg  50 mg Oral Q M,W,F-1800 Rise Patience, MD   50 mg at 07/18/20 0049  . calcium acetate (PHOSLO) capsule 667 mg  667 mg Oral TID WC Rise Patience, MD  667 mg at 07/19/20 1139  . Chlorhexidine Gluconate Cloth 2 % PADS 6 each  6 each Topical Daily [name], MD   6 each at 07/19/20 0848  . cycloSPORINE modified (NEORAL) capsule 100 mg  100 mg Oral QHS Rise Patience, MD   100 mg at 07/18/20 2222  . cycloSPORINE modified (NEORAL) capsule 75 mg  75 mg Oral Daily Rise Patience, MD   75 mg at 07/19/20 0845  . dorzolamide-timolol (COSOPT) 22.3-6.8 MG/ML ophthalmic solution 1 drop  1 drop Both Eyes BID Rise Patience, MD   1 drop at 07/19/20 0846  . [START ON 07/20/2020] ferrous sulfate tablet 325 mg  325 mg Oral Q M,W,F Rise Patience, MD       And  . ferrous sulfate tablet 325 mg  325 mg Oral Q T,Th,S,Su Rise Patience, MD   325 mg at 07/19/20 1139  . guaiFENesin (MUCINEX) 12 hr tablet 1,200 mg  1,200 mg Oral BID Rise Patience, MD   1,200 mg at 07/19/20 0845  . heparin injection 5,000 Units  5,000 Units Subcutaneous Q8H Rise Patience, MD   5,000 Units at 07/19/20 4128  . insulin aspart (novoLOG) injection 0-6 Units  0-6 Units Subcutaneous TID WC Rise Patience, MD      . Derrill Memo ON 07/20/2020] midodrine (PROAMATINE) tablet 10 mg  10 mg Oral Q M,W,F Rise Patience, MD      . midodrine (PROAMATINE) tablet 5 mg  5 mg Oral TID PRN Rise Patience, MD      . morphine 2 MG/ML injection 2 mg  2 mg Intravenous Q4H PRN  [name], MD   2 mg at 07/19/20 0314  . oxyCODONE-acetaminophen (PERCOCET/ROXICET) 5-325 MG per tablet 1 tablet  1 tablet Oral Q6H PRN Rise Patience, MD   1 tablet at 07/19/20 [phone]  . pantoprazole (PROTONIX) EC tablet 40 mg  40 mg Oral Daily Rise Patience, MD   40 mg at 07/19/20 0845  . piperacillin-tazobactam (ZOSYN) IVPB 2.25 g  2.25 g Intravenous Q8H [name], MD      . pravastatin (PRAVACHOL) tablet 10 mg  10 mg Oral QHS Rise Patience, MD   10 mg at 07/18/20 2222  . sulfamethoxazole-trimethoprim (BACTRIM) 400-80 MG per tablet 1 tablet  1 tablet Oral Q M,W,F-1800 Rise Patience, MD   1 tablet at 07/18/20 0049  . tiZANidine (ZANAFLEX) tablet 2 mg  2 mg Oral QHS PRN Rise Patience, MD      . valGANciclovir (VALCYTE) 450 MG tablet TABS 450 mg  450 mg Oral Once per day on Mon Fri Rise Patience, MD   450 mg at 07/18/20 0049  . [START ON 07/20/2020] vancomycin (VANCOCIN) IVPB 750 mg/150 ml premix  750 mg Intravenous Q M,W,F-HD Rise Patience, MD      . vitamin B-6 (pyridOXINE) tablet 25 mg  25 mg Oral Q breakfast Rise Patience, MD   25 mg at 07/19/20 0845   Labs: Basic Metabolic Panel: Recent Labs  Lab 07/17/20 1320 07/18/20 0553 07/19/20 0626  NA 139 140 137  K 4.0 4.2 4.7  CL 96* 99 95*  CO2 26 29 28   GLUCOSE 128* 72 89  BUN 11 20 32*  CREATININE 3.54* 5.16* 7.21*  CALCIUM 8.6* 7.9* 8.2*  PHOS  --   --  3.9   Liver Function Tests: Recent Labs  Lab 07/18/20 0553  AST 30  ALT 17  ALKPHOS 327*  BILITOT 1.1  PROT 5.7*  ALBUMIN 2.3*   CBC: Recent Labs  Lab 07/15/20 1354 07/17/20 1320 07/18/20 0553  WBC 3.4* 3.8* 3.2*  HGB 12.5* 12.3* 10.4*  HCT 40.9 38.2* 31.4*  MCV 112.4* 107.9* 106.4*  PLT 168 223 201   CBG: Recent Labs  Lab 07/18/20 1155 07/18/20 1611 07/18/20 2120 07/19/20 0612 07/19/20 1112  GLUCAP 72 119* 141* 81 85   Iron Studies: No results for input(s): IRON, TIBC, TRANSFERRIN, FERRITIN  in the last 72 hours. Studies/Results: CT ANGIO CHEST PE W OR WO CONTRAST  Result Date: 07/18/2020 CLINICAL DATA:  PE suspected. EXAM: CT ANGIOGRAPHY CHEST WITH CONTRAST TECHNIQUE: Multidetector CT imaging of the chest was performed using the standard protocol during bolus administration of intravenous contrast. Multiplanar CT image reconstructions and MIPs were obtained to evaluate the vascular anatomy. CONTRAST:  122mL OMNIPAQUE IOHEXOL 350 MG/ML SOLN COMPARISON:  07/15/2020 FINDINGS: Cardiovascular: Contrast injection is sufficient to demonstrate satisfactory opacification of the pulmonary arteries to the segmental level. There is no pulmonary embolus or evidence of right heart strain. There are atherosclerotic changes throughout the thoracic aorta without evidence for an aneurysm or dissection. The patient is status post prior CABG. There are coronary artery calcifications. Mediastinum/Nodes: -- No mediastinal lymphadenopathy. -- No hilar lymphadenopathy. -- No axillary lymphadenopathy. -- No supraclavicular lymphadenopathy. -- Normal thyroid gland where visualized. -  Unremarkable esophagus. Lungs/Pleura: Again noted is a masslike area of consolidation involving the left upper lobe. Advanced fibrotic changes are again noted of the left lung field with significant left-sided volume loss as before. There is a small amount of debris within the trachea. No right-sided pneumothorax. There are trace bilateral pleural effusions. Upper Abdomen: Contrast bolus timing is not optimized for evaluation of the abdominal organs. Again noted are findings suggestive of cirrhosis. There is cholelithiasis. There are probable nonobstructing stones in the upper pole the right kidney. Musculoskeletal: No chest wall abnormality. No bony spinal canal stenosis. Review of the MIP images confirms the above findings. IMPRESSION: 1. No evidence for acute pulmonary embolus. 2. No significant oval change in left-sided lung findings  including a left upper lobe lung mass. 3. Probable cirrhosis and cholelithiasis involving the partially visualized upper abdomen. Aortic Atherosclerosis (ICD10-I70.0). Electronically Signed   By: Constance Holster M.D.   On: 07/18/2020 19:58   DG Chest Port 1 View  Result Date: 07/19/2020 CLINICAL DATA:  Difficulty breathing.  Concern for aspiration. EXAM: PORTABLE CHEST 1 VIEW COMPARISON:  Chest radiograph and chest CT July 18, 2020 FINDINGS: There is extensive fibrosis throughout the left lung. Consolidation left apex region with questionable mass in this area, unchanged. There is mild right base atelectasis. Right lung otherwise clear. Postoperative change consistent with prior right lung transplant on the right. Heart size is within normal limits. Distortion of pulmonary vascularity on the left due to fibrosis is stable. Stents on the left are stable. There is aortic atherosclerosis. No bone lesions. No adenopathy appreciable by radiography. No bone lesions. IMPRESSION: Extensive fibrosis on the left with persistent opacity in the left apex. CT shows evidence suggesting potential mass with associated consolidation in the left apex. Mild atelectasis right base. Right lung otherwise clear. Stable cardiac silhouette. Postoperative changes noted. Aortic Atherosclerosis (ICD10-I70.0). Electronically Signed   By: Lowella Grip III M.D.   On: 07/19/2020 08:53   DG CHEST PORT 1 VIEW  Result Date: 07/18/2020 CLINICAL DATA:  70 year old male with possible pneumonia. Left apical masslike opacity. History  of lung transplant. EXAM: PORTABLE CHEST 1 VIEW COMPARISON:  Chest CT 07/15/2020 and earlier. FINDINGS: Portable AP semi upright view at 0510 hours. Similar patient rotation to the left. Stable superior mediastinal vascular stent. Coarse pulmonary reticulonodular density throughout the left lung in keeping with the fibrosis seen on CT. Confluent left apical opacity persists. No pneumothorax. Stable cardiac  size and mediastinal contours. Right lung appears stable and negative. Stable visualized osseous structures. IMPRESSION: 1. Stable from the chest CT 05/14/2021 (please see that report). Mass-like left apical opacity superimposed on chronic fibrotic left lung disease. 2. Negative right lung. Electronically Signed   By: Genevie Ann M.D.   On: 07/18/2020 06:01   ECHOCARDIOGRAM COMPLETE  Result Date: 07/19/2020    ECHOCARDIOGRAM REPORT   Patient Name:   JAVAN GONZAGA [facility] Date of Exam: 07/19/2020 Medical Rec #:  [id number]         Height:       63.0 in Accession #:    [id number]        Weight:       148.6 lb Date of Birth:  1950/12/05        BSA:          1.704 m Patient Age:    40 years          BP:           135/74 mmHg Patient Gender: M                 HR:           78 bpm. Exam Location:  Inpatient Procedure: 2D Echo Indications:    acute respiratory distress  History:        Patient has prior history of Echocardiogram examinations, most                 recent 03/04/2013. End stage renal disease; Risk Factors:Sleep                 Apnea, Hypertension and Diabetes.  Sonographer:    Johny Chess Referring Phys: [place]  1. Left ventricular ejection fraction, by estimation, is 70 to 75%. The left ventricle has hyperdynamic function. The left ventricle has no regional wall motion abnormalities. There is mild concentric left ventricular hypertrophy. Left ventricular diastolic parameters are consistent with Grade I diastolic dysfunction (impaired relaxation).  2. RV incompletely visualized. Based on limited views, the right ventricular systolic function is normal. The right ventricular size is normal. There is normal pulmonary artery systolic pressure.  3. The mitral valve is abnormal. Trivial mitral valve regurgitation. Moderate mitral annular calcification.  4. The aortic valve is tricuspid. There is moderate calcification of the aortic valve. There is moderate thickening of the aortic valve.  Aortic valve regurgitation is mild. Mild aortic valve stenosis.  5. The inferior vena cava is normal in size with greater than 50% respiratory variability, suggesting right atrial pressure of 3 mmHg. Comparison(s): Compared to prior echo report in 2014, there is now mild aortic stenosis. FINDINGS  Left Ventricle: Left ventricular ejection fraction, by estimation, is 70 to 75%. The left ventricle has hyperdynamic function. The left ventricle has no regional wall motion abnormalities. The left ventricular internal cavity size was normal in size. There is mild concentric left ventricular hypertrophy. Left ventricular diastolic parameters are consistent with Grade I diastolic dysfunction (impaired relaxation). Right Ventricle: RV incompletely visualized. Based on limited views, the right ventricular size is normal. Right vetricular wall thickness was not  well visualized. Right ventricular systolic function is normal. There is normal pulmonary artery systolic pressure. The tricuspid regurgitant velocity is 2.61 m/s, and with an assumed right atrial pressure of 3 mmHg, the estimated right ventricular systolic pressure is 00.8 mmHg. Left Atrium: Left atrial size was normal in size. Right Atrium: Right atrial size was normal in size. Pericardium: There is no evidence of pericardial effusion. Mitral Valve: The mitral valve is abnormal. There is mild thickening of the mitral valve leaflet(s). There is mild calcification of the mitral valve leaflet(s). Moderate mitral annular calcification. Trivial mitral valve regurgitation. Tricuspid Valve: The tricuspid valve is normal in structure. Tricuspid valve regurgitation is mild. Aortic Valve: The aortic valve is tricuspid. There is moderate calcification of the aortic valve. There is moderate thickening of the aortic valve. Aortic valve regurgitation is mild. Mild aortic stenosis is present. Aortic valve mean gradient measures 9.0 mmHg. Aortic valve peak gradient measures 15.7  mmHg. Aortic valve area, by VTI measures 1.59 cm. Pulmonic Valve: The pulmonic valve was normal in structure. Pulmonic valve regurgitation is not visualized. Aorta: The aortic root and ascending aorta are structurally normal, with no evidence of dilitation. Venous: The inferior vena cava is normal in size with greater than 50% respiratory variability, suggesting right atrial pressure of 3 mmHg. IAS/Shunts: No atrial level shunt detected by color flow Doppler.  LEFT VENTRICLE PLAX 2D LVIDd:         3.60 cm  Diastology LVIDs:         2.00 cm  LV e' medial:    5.44 cm/s LV PW:         0.90 cm  LV E/e' medial:  17.5 LV IVS:        1.20 cm  LV e' lateral:   8.59 cm/s LVOT diam:     1.90 cm  LV E/e' lateral: 11.1 LV SV:         56 LV SV Index:   33 LVOT Area:     2.84 cm  RIGHT VENTRICLE             IVC RV S prime:     12.30 cm/s  IVC diam: 1.30 cm TAPSE (M-mode): 1.4 cm LEFT ATRIUM           Index       RIGHT ATRIUM           Index LA diam:      3.70 cm 2.17 cm/m  RA Area:     12.40 cm LA Vol (A2C): 34.4 ml 20.18 ml/m RA Volume:   28.90 ml  16.96 ml/m  AORTIC VALVE AV Area (Vmax):    1.52 cm AV Area (Vmean):   1.44 cm AV Area (VTI):     1.59 cm AV Vmax:           198.00 cm/s AV Vmean:          137.000 cm/s AV VTI:            0.355 m AV Peak Grad:      15.7 mmHg AV Mean Grad:      9.0 mmHg LVOT Vmax:         106.25 cm/s LVOT Vmean:        69.400 cm/s LVOT VTI:          0.199 m LVOT/AV VTI ratio: 0.56  AORTA Ao Root diam: 2.80 cm Ao Asc diam:  2.90 cm MITRAL VALVE  TRICUSPID VALVE MV Area (PHT): 2.66 cm     TR Peak grad:   27.2 mmHg MV Decel Time: 285 msec     TR Vmax:        261.00 cm/s MV E velocity: 95.10 cm/s MV A velocity: 108.00 cm/s  SHUNTS MV E/A ratio:  0.88         Systemic VTI:  0.20 m                             Systemic Diam: 1.90 cm Gwyndolyn Kaufman MD Electronically signed by Gwyndolyn Kaufman MD Signature Date/Time: 07/19/2020/11:29:29 AM    Final    Outpatient Dialysis Orders:   Center: [facility]  on MWF . 160NRe, 4 hours, BFR 400, DFR 500 (due to dialyzer shortage), EDW 64.5kg, 3K/2Ca, UF Profile 2, AVF 15g, heparin 1800 unit bolus Hectorol 3 mcg IV q HD  Assessment/Plan: 1.  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 Worsening dyspnea x 2 weeks. On zosyn per pulmonology, planning to transfer to [facility] when bed is available 2.  ESRD:  Dialyzes on MWF schedule. No acute volume overload on exam, no uremic sx, K+ controlled. Will plan for next HD tomorrow, continue TTS schedule.  3.  Hypertension/volume: BP moderately elevated. No volume overload on exam or CXR. Over EDW by weights here however bed scale may be inaccurate. Generally tolerates about 2L UF with HD.  4.  Anemia: Hgb at goal. No ESA indicated at this time, restart cautiously due to concern for possible malignancy.  5.  Metabolic bone disease: Calcium and phosphorus at goal. Continue VDRA and calcium acetate.  6. T2DM: Insulin per primary team  Anice Paganini, PA-C 07/19/2020, 12:42 PM  [facility] Pager: [phone]

## 2020-07-19 NOTE — Discharge Summary (Signed)
Physician Discharge Summary  [name]:[id number] DOB: 08/27/1950 DOA: 07/17/2020  PCP: Wenda Low, MD  Admit date: 07/17/2020 Discharge date: 07/19/2020  Admitted From: Home  Disposition: [facility]   Recommendations for Outpatient Follow-up:  1. Follow up with PCP in 1-2 weeks 2. Please obtain BMP/CBC in one week 3. Please follow up on the following pending results:   Discharge Condition: Guarded  CODE STATUS: Full  Diet recommendation: Renal/carb-modified    Brief/Interim Summary:  [name] is a 70 with a history of pulmonary fibrosis, diabetes mellitus, hypertension, ESRD on hemodialysis, and right lung transplant in 2016 who presented on 07/17/2020 with progressive dyspnea over the course of 2 to 3 weeks.  He denied fevers, hemoptysis, or new lower extremity swelling, but noted worsening exertional dyspnea and dizziness with exertion.  He reported compliance with his immunosuppressants and dialysis.    In the emergency department, patient was saturating well on room air while at rest and had chest CT with left upper lobe masslike consolidation.  [facility] transfer center was contacted by the ED physician but they were full at the time and unable to accept any additional transfers; patient was placed on their waiting list.  Pulmonology was consulted, and patient was admitted to the hospitalist service.   Hospital course: Patient was cultured, QuantiFERON gold testing was sent, and he was started on vancomycin and Zosyn, MRSA swab was negative and antibiotics narrowed to Zosyn only.  He was continued on azathioprine, cyclosporine, and valganciclovir.  Diabetes was managed with sliding scale insulin.  Nephrology was consulted and plan to dialyze the patient tomorrow according to his usual schedule. Blood cultures from 2/11 with NGTD. Respiratory virus panel negative. QuantiFERON indeterminate. Echo was performed with results documented below. Pulmonologist here at [facility]  has been in contact with Dr. Annamaria Boots of pulmonology at [facility] with plan for transfer to their facility once a bed is available.   Discharge Diagnoses:  Principal Problem:   Lung mass Active Problems:   Obstructive sleep apnea   Diabetes mellitus with complication (HCC)   Essential hypertension   ILD (interstitial lung disease) (HCC)   End-stage renal disease on hemodialysis (HCC)   Pneumonia   Exercise hypoxemia    Allergies  Allergen Reactions  . Levofloxacin Other (See Comments)    LOSS OF CONSCIOUSNESS  . Nsaids Other (See Comments)    Patient instructed not to take NSAID's after his lung transplant    Consultations: Pulmonology (Dr. Chase Caller), nephrology (Dr. Theda Sers)   Procedures/Studies: CT Chest Wo Contrast  Result Date: 07/15/2020 CLINICAL DATA:  Fall, left rib pain with left rib fracture suspected. History of lung transplant. EXAM: CT CHEST WITHOUT CONTRAST TECHNIQUE: Multidetector CT imaging of the chest was performed following the standard protocol without IV contrast. COMPARISON:  Chest radiograph from earlier today. 01/08/2020 chest CT angiogram. FINDINGS: Cardiovascular: Normal heart size. No significant pericardial fluid/thickening. Three-vessel coronary atherosclerosis. Atherosclerotic nonaneurysmal thoracic aorta. Top-normal caliber main pulmonary artery (3.3 cm diameter). No evidence of acute thoracic aortic intramural hematoma. Left brachiocephalic vein stent is stable in position. Left cephalic vein stent is in place. Mediastinum/Nodes: No pneumomediastinum. No mediastinal hematoma. No discrete thyroid nodules. Unremarkable esophagus. No axillary, mediastinal or hilar lymphadenopathy. Lungs/Pleura: No pneumothorax. No pleural effusion. Moderate centrilobular and paraseptal emphysema in the left lung. Asymmetric prominent volume loss in the left lung, unchanged. Stable thin linear scar in the right lower lobe. New masslike consolidation in the posterior apical left upper  lobe measuring 3.9 x 3.6  cm (series 5/image 23). Extensive patchy confluent reticulation and ground-glass opacity throughout the left lung with associated prominent traction bronchiectasis and architectural distortion, unchanged, compatible with band. Fibrotic interstitial lung disease. No additional significant pulmonary nodules. Upper Abdomen: Cholelithiasis. Diffusely mildly irregular liver surface suggesting cirrhosis. Nonobstructing upper right renal stones, largest 3 mm. Musculoskeletal: Subtle erosive change along the anterior surface of the posterior left third rib (series 5/image 20). No fracture detected in the chest. Mild thoracic spondylosis. IMPRESSION: 1. New masslike consolidation in the posterior apical left upper lobe measuring 3.9 x 3.6 cm, cannot exclude primary bronchogenic carcinoma. PET-CT suggested for further evaluation. 2. Subtle erosive change along the anterior surface of the posterior left third rib, cannot exclude direct tumor involvement. No discrete bone fracture in the chest. 3. 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 No acute pulmonary disease in the right lung transplant. 4. Cholelithiasis. 5. Morphologic changes in the liver suggestive of cirrhosis. 6. Nonobstructing right nephrolithiasis. 7. Aortic Atherosclerosis (ICD10-I70.0) and Emphysema (ICD10-J43.9). Electronically Signed   By: Ilona Sorrel M.D.   On: 07/15/2020 17:31   CT ANGIO CHEST PE W OR WO CONTRAST  Result Date: 07/18/2020 CLINICAL DATA:  PE suspected. EXAM: CT ANGIOGRAPHY CHEST WITH CONTRAST TECHNIQUE: Multidetector CT imaging of the chest was performed using the standard protocol during bolus administration of intravenous contrast. Multiplanar CT image reconstructions and MIPs were obtained to evaluate the vascular anatomy. CONTRAST:  158mL OMNIPAQUE IOHEXOL 350 MG/ML SOLN COMPARISON:  07/15/2020 FINDINGS: Cardiovascular: Contrast injection is sufficient to demonstrate satisfactory  opacification of the pulmonary arteries to the segmental level. There is no pulmonary embolus or evidence of right heart strain. There are atherosclerotic changes throughout the thoracic aorta without evidence for an aneurysm or dissection. The patient is status post prior CABG. There are coronary artery calcifications. Mediastinum/Nodes: -- No mediastinal lymphadenopathy. -- No hilar lymphadenopathy. -- No axillary lymphadenopathy. -- No supraclavicular lymphadenopathy. -- Normal thyroid gland where visualized. -  Unremarkable esophagus. Lungs/Pleura: Again noted is a masslike area of consolidation involving the left upper lobe. Advanced fibrotic changes are again noted of the left lung field with significant left-sided volume loss as before. There is a small amount of debris within the trachea. No right-sided pneumothorax. There are trace bilateral pleural effusions. Upper Abdomen: Contrast bolus timing is not optimized for evaluation of the abdominal organs. Again noted are findings suggestive of cirrhosis. There is cholelithiasis. There are probable nonobstructing stones in the upper pole the right kidney. Musculoskeletal: No chest wall abnormality. No bony spinal canal stenosis. Review of the MIP images confirms the above findings. IMPRESSION: 1. No evidence for acute pulmonary embolus. 2. No significant oval change in left-sided lung findings including a left upper lobe lung mass. 3. Probable cirrhosis and cholelithiasis involving the partially visualized upper abdomen. Aortic Atherosclerosis (ICD10-I70.0). Electronically Signed   By: Constance Holster M.D.   On: 07/18/2020 19:58   DG Chest Port 1 View  Result Date: 07/19/2020 CLINICAL DATA:  Difficulty breathing.  Concern for aspiration. EXAM: PORTABLE CHEST 1 VIEW COMPARISON:  Chest radiograph and chest CT July 18, 2020 FINDINGS: There is extensive fibrosis throughout the left lung. Consolidation left apex region with questionable mass in this area,  unchanged. There is mild right base atelectasis. Right lung otherwise clear. Postoperative change consistent with prior right lung transplant on the right. Heart size is within normal limits. Distortion of pulmonary vascularity on the left due to fibrosis is stable. Stents on the left are stable. There  is aortic atherosclerosis. No bone lesions. No adenopathy appreciable by radiography. No bone lesions. IMPRESSION: Extensive fibrosis on the left with persistent opacity in the left apex. CT shows evidence suggesting potential mass with associated consolidation in the left apex. Mild atelectasis right base. Right lung otherwise clear. Stable cardiac silhouette. Postoperative changes noted. Aortic Atherosclerosis (ICD10-I70.0). Electronically Signed   By: Lowella Grip III M.D.   On: 07/19/2020 08:53   DG CHEST PORT 1 VIEW  Result Date: 07/18/2020 CLINICAL DATA:  70 year old male with possible pneumonia. Left apical masslike opacity. History of lung transplant. EXAM: PORTABLE CHEST 1 VIEW COMPARISON:  Chest CT 07/15/2020 and earlier. FINDINGS: Portable AP semi upright view at 0510 hours. Similar patient rotation to the left. Stable superior mediastinal vascular stent. Coarse pulmonary reticulonodular density throughout the left lung in keeping with the fibrosis seen on CT. Confluent left apical opacity persists. No pneumothorax. Stable cardiac size and mediastinal contours. Right lung appears stable and negative. Stable visualized osseous structures. IMPRESSION: 1. Stable from the chest CT 05/14/2021 (please see that report). Mass-like left apical opacity superimposed on chronic fibrotic left lung disease. 2. Negative right lung. Electronically Signed   By: Genevie Ann M.D.   On: 07/18/2020 06:01   DG Chest Portable 1 View  Result Date: 07/15/2020 CLINICAL DATA:  Shortness of breath. History of lung transplant on the right EXAM: PORTABLE CHEST 1 VIEW COMPARISON:  April 24, 2020 FINDINGS: Postoperative change  on the right consistent with previous lung transplant. There is a small right pleural effusion. There is slight atelectasis in the right base. The right lung is otherwise clear. There is volume loss on the left with widespread fibrosis throughout the left lung, stable. Heart size is normal. Stable pulmonary vascularity. Stable stents on the left. No appreciable adenopathy. There is aortic atherosclerosis. No bone lesions. IMPRESSION: Volume loss left lung with extensive fibrosis, grossly stable. Right lung transplant clear except for small right pleural effusion and mild right lower lobe atelectasis. Postoperative changes noted on the right. Stable cardiac silhouette. Stents noted at several sites on the left. No adenopathy appreciable. Aortic Atherosclerosis (ICD10-I70.0). Electronically Signed   By: Lowella Grip III M.D.   On: 07/15/2020 14:45   ECHOCARDIOGRAM COMPLETE  Result Date: 07/19/2020    ECHOCARDIOGRAM REPORT   Patient Name:   [name] [facility] Date of Exam: 07/19/2020 Medical Rec #:  [id number]         Height:       63.0 in Accession #:    [id number]        Weight:       148.6 lb Date of Birth:  [birth year]        BSA:          1.704 m Patient Age:    25 years          BP:           135/74 mmHg Patient Gender: M                 HR:           78 bpm. Exam Location:  Inpatient Procedure: 2D Echo Indications:    acute respiratory distress  History:        Patient has prior history of Echocardiogram examinations, most                 recent 03/04/2013. End stage renal disease; Risk Factors:Sleep  Apnea, Hypertension and Diabetes.  Sonographer:    Johny Chess Referring Phys: [place]  1. Left ventricular ejection fraction, by estimation, is 70 to 75%. The left ventricle has hyperdynamic function. The left ventricle has no regional wall motion abnormalities. There is mild concentric left ventricular hypertrophy. Left ventricular diastolic parameters are  consistent with Grade I diastolic dysfunction (impaired relaxation).  2. RV incompletely visualized. Based on limited views, the right ventricular systolic function is normal. The right ventricular size is normal. There is normal pulmonary artery systolic pressure.  3. The mitral valve is abnormal. Trivial mitral valve regurgitation. Moderate mitral annular calcification.  4. The aortic valve is tricuspid. There is moderate calcification of the aortic valve. There is moderate thickening of the aortic valve. Aortic valve regurgitation is mild. Mild aortic valve stenosis.  5. The inferior vena cava is normal in size with greater than 50% respiratory variability, suggesting right atrial pressure of 3 mmHg. Comparison(s): Compared to prior echo report in 2014, there is now mild aortic stenosis. FINDINGS  Left Ventricle: Left ventricular ejection fraction, by estimation, is 70 to 75%. The left ventricle has hyperdynamic function. The left ventricle has no regional wall motion abnormalities. The left ventricular internal cavity size was normal in size. There is mild concentric left ventricular hypertrophy. Left ventricular diastolic parameters are consistent with Grade I diastolic dysfunction (impaired relaxation). Right Ventricle: RV incompletely visualized. Based on limited views, the right ventricular size is normal. Right vetricular wall thickness was not well visualized. Right ventricular systolic function is normal. There is normal pulmonary artery systolic pressure. The tricuspid regurgitant velocity is 2.61 m/s, and with an assumed right atrial pressure of 3 mmHg, the estimated right ventricular systolic pressure is 00.9 mmHg. Left Atrium: Left atrial size was normal in size. Right Atrium: Right atrial size was normal in size. Pericardium: There is no evidence of pericardial effusion. Mitral Valve: The mitral valve is abnormal. There is mild thickening of the mitral valve leaflet(s). There is mild calcification of  the mitral valve leaflet(s). Moderate mitral annular calcification. Trivial mitral valve regurgitation. Tricuspid Valve: The tricuspid valve is normal in structure. Tricuspid valve regurgitation is mild. Aortic Valve: The aortic valve is tricuspid. There is moderate calcification of the aortic valve. There is moderate thickening of the aortic valve. Aortic valve regurgitation is mild. Mild aortic stenosis is present. Aortic valve mean gradient measures 9.0 mmHg. Aortic valve peak gradient measures 15.7 mmHg. Aortic valve area, by VTI measures 1.59 cm. Pulmonic Valve: The pulmonic valve was normal in structure. Pulmonic valve regurgitation is not visualized. Aorta: The aortic root and ascending aorta are structurally normal, with no evidence of dilitation. Venous: The inferior vena cava is normal in size with greater than 50% respiratory variability, suggesting right atrial pressure of 3 mmHg. IAS/Shunts: No atrial level shunt detected by color flow Doppler.  LEFT VENTRICLE PLAX 2D LVIDd:         3.60 cm  Diastology LVIDs:         2.00 cm  LV e' medial:    5.44 cm/s LV PW:         0.90 cm  LV E/e' medial:  17.5 LV IVS:        1.20 cm  LV e' lateral:   8.59 cm/s LVOT diam:     1.90 cm  LV E/e' lateral: 11.1 LV SV:         56 LV SV Index:   33 LVOT Area:  2.84 cm  RIGHT VENTRICLE             IVC RV S prime:     12.30 cm/s  IVC diam: 1.30 cm TAPSE (M-mode): 1.4 cm LEFT ATRIUM           Index       RIGHT ATRIUM           Index LA diam:      3.70 cm 2.17 cm/m  RA Area:     12.40 cm LA Vol (A2C): 34.4 ml 20.18 ml/m RA Volume:   28.90 ml  16.96 ml/m  AORTIC VALVE AV Area (Vmax):    1.52 cm AV Area (Vmean):   1.44 cm AV Area (VTI):     1.59 cm AV Vmax:           198.00 cm/s AV Vmean:          137.000 cm/s AV VTI:            0.355 m AV Peak Grad:      15.7 mmHg AV Mean Grad:      9.0 mmHg LVOT Vmax:         106.25 cm/s LVOT Vmean:        69.400 cm/s LVOT VTI:          0.199 m LVOT/AV VTI ratio: 0.56  AORTA Ao Root  diam: 2.80 cm Ao Asc diam:  2.90 cm MITRAL VALVE                TRICUSPID VALVE MV Area (PHT): 2.66 cm     TR Peak grad:   27.2 mmHg MV Decel Time: 285 msec     TR Vmax:        261.00 cm/s MV E velocity: 95.10 cm/s MV A velocity: 108.00 cm/s  SHUNTS MV E/A ratio:  0.88         Systemic VTI:  0.20 m                             Systemic Diam: 1.90 cm Gwyndolyn Kaufman MD Electronically signed by Gwyndolyn Kaufman MD Signature Date/Time: 07/19/2020/11:29:29 AM    Final      Subjective: No complaints, ready for transfer to [facility]   Discharge Exam: Vitals:   07/19/20 1208 07/19/20 1657  BP: 121/80 122/70  Pulse: 67 71  Resp: 14 14  Temp: 97.8 F (36.6 C) 97.7 F (36.5 C)  SpO2: 96% 98%   Vitals:   07/18/20 2330 07/19/20 0412 07/19/20 1208 07/19/20 1657  BP: (!) 164/95 135/74 121/80 122/70  Pulse: 85 88 67 71  Resp: 13 14 14 14   Temp: 97.9 F (36.6 C) 98.8 F (37.1 C) 97.8 F (36.6 C) 97.7 F (36.5 C)  TempSrc: Axillary Oral Oral Oral  SpO2: 100% 94% 96% 98%  Weight:      Height:        General: Pt is awake, not in acute distress, on rm air Cardiovascular: RRR, S1/S2 +, no rubs, no gallops Respiratory: No tachypnea, no wheezing, no rhonchi Abdominal: Soft, NT, ND, bowel sounds + Extremities: no red/hot joint, no cyanosis    The results of significant diagnostics from this hospitalization (including imaging, microbiology, ancillary and laboratory) are listed below for reference.     Microbiology: Recent Results (from the past 240 hour(s))  Resp Panel by RT-PCR (Flu A&B, Covid) Nasopharyngeal Swab     Status: None   Collection  Time: 07/15/20  2:22 PM   Specimen: Nasopharyngeal Swab; Nasopharyngeal(NP) swabs in vial transport medium  Result Value Ref Range Status   SARS Coronavirus 2 by RT PCR NEGATIVE NEGATIVE Final    Comment: (NOTE) SARS-CoV-2 target nucleic acids are NOT DETECTED.  The SARS-CoV-2 RNA is generally detectable in upper respiratory specimens during the  acute phase of infection. The lowest concentration of SARS-CoV-2 viral copies this assay can detect is 138 copies/mL. A negative result does not preclude SARS-Cov-2 infection and should not be used as the sole basis for treatment or other patient management decisions. A negative result may occur with  improper specimen collection/handling, submission of specimen other than nasopharyngeal swab, presence of viral mutation(s) within the areas targeted by this assay, and inadequate number of viral copies(<138 copies/mL). A negative result must be combined with clinical observations, patient history, and epidemiological information. The expected result is Negative.  Fact Sheet for Patients:  EntrepreneurPulse.com.au  Fact Sheet for Healthcare Providers:  IncredibleEmployment.be  This test is no t yet approved or cleared by the [place] FDA and  has been authorized for detection and/or diagnosis of SARS-CoV-2 by FDA under an Emergency Use Authorization (EUA). This EUA will remain  in effect (meaning this test can be used) for the duration of the COVID-19 declaration under Section 564(b)(1) of the Act, 21 U.S.C.section 360bbb-3(b)(1), unless the authorization is terminated  or revoked sooner.       Influenza A by PCR NEGATIVE NEGATIVE Final   Influenza B by PCR NEGATIVE NEGATIVE Final    Comment: (NOTE) The Xpert Xpress SARS-CoV-2/FLU/RSV plus assay is intended as an aid in the diagnosis of influenza from Nasopharyngeal swab specimens and should not be used as a sole basis for treatment. Nasal washings and aspirates are unacceptable for Xpert Xpress SARS-CoV-2/FLU/RSV testing.  Fact Sheet for Patients: EntrepreneurPulse.com.au  Fact Sheet for Healthcare Providers: IncredibleEmployment.be  This test is not yet approved or cleared by the [place] FDA and has been authorized for detection and/or  diagnosis of SARS-CoV-2 by FDA under an Emergency Use Authorization (EUA). This EUA will remain in effect (meaning this test can be used) for the duration of the COVID-19 declaration under Section 564(b)(1) of the Act, 21 U.S.C. section 360bbb-3(b)(1), unless the authorization is terminated or revoked.  Performed at [facility], [place] [address] [place], [place] [address]   Culture, blood (routine x 2)     Status: None (Preliminary result)   Collection Time: 07/17/20  6:00 PM   Specimen: BLOOD RIGHT HAND  Result Value Ref Range Status   Specimen Description BLOOD RIGHT HAND  Final   Special Requests   Final    BOTTLES DRAWN AEROBIC AND ANAEROBIC Blood Culture results may not be optimal due to an inadequate volume of blood received in culture bottles   Culture   Final    NO GROWTH 2 DAYS Performed at [facility], [place] [address] [place],  [address]    Report Status PENDING  Incomplete  Culture, blood (routine x 2)     Status: None (Preliminary result)   Collection Time: 07/17/20  6:07 PM   Specimen: BLOOD  Result Value Ref Range Status   Specimen Description BLOOD RIGHT ANTECUBITAL  Final   Special Requests   Final    BOTTLES DRAWN AEROBIC AND ANAEROBIC Blood Culture adequate volume   Culture   Final    NO GROWTH 2 DAYS Performed at [facility], [place]  [address], [place], [place] [address]    Report Status PENDING  Incomplete  MRSA PCR Screening     Status: None   Collection Time: 07/18/20  5:57 AM   Specimen: Nasopharyngeal  Result Value Ref Range Status   MRSA by PCR NEGATIVE NEGATIVE Final    Comment:        The GeneXpert MRSA Assay (FDA approved for NASAL specimens only), is one component of a comprehensive MRSA colonization surveillance program. It is not intended to diagnose MRSA infection nor to guide or monitor treatment for MRSA infections. Performed at [facility], [place] [address], [place], [place] [address]   Respiratory (~20  pathogens) panel by PCR     Status: None   Collection Time: 07/19/20  6:14 PM   Specimen: Nasopharyngeal Swab; Respiratory  Result Value Ref Range Status   Adenovirus NOT DETECTED NOT DETECTED Final   Coronavirus 229E NOT DETECTED NOT DETECTED Final    Comment: (NOTE) The Coronavirus on the Respiratory Panel, DOES NOT test for the novel  Coronavirus (2019 nCoV)    Coronavirus HKU1 NOT DETECTED NOT DETECTED Final   Coronavirus NL63 NOT DETECTED NOT DETECTED Final   Coronavirus OC43 NOT DETECTED NOT DETECTED Final   Metapneumovirus NOT DETECTED NOT DETECTED Final   Rhinovirus / Enterovirus NOT DETECTED NOT DETECTED Final   Influenza A NOT DETECTED NOT DETECTED Final   Influenza B NOT DETECTED NOT DETECTED Final   Parainfluenza Virus 1 NOT DETECTED NOT DETECTED Final   Parainfluenza Virus 2 NOT DETECTED NOT DETECTED Final   Parainfluenza Virus 3 NOT DETECTED NOT DETECTED Final   Parainfluenza Virus 4 NOT DETECTED NOT DETECTED Final   Respiratory Syncytial Virus NOT DETECTED NOT DETECTED Final   Bordetella pertussis NOT DETECTED NOT DETECTED Final   Bordetella Parapertussis NOT DETECTED NOT DETECTED Final   Chlamydophila pneumoniae NOT DETECTED NOT DETECTED Final   Mycoplasma pneumoniae NOT DETECTED NOT DETECTED Final    Comment: Performed at [facility], [place]. [address], [place],  [address]     Labs: BNP (last 3 results) Recent Labs    09/03/19 1745 07/17/20 1436  BNP 324.0* 245.8*   Basic Metabolic Panel: Recent Labs  Lab 07/15/20 1354 07/17/20 1320 07/18/20 0553 07/19/20 0626  NA 137 139 140 137  K 4.4 4.0 4.2 4.7  CL 95* 96* 99 95*  CO2 23 26 29 28   GLUCOSE 144* 128* 72 89  BUN 8 11 20  32*  CREATININE 3.89* 3.54* 5.16* 7.21*  CALCIUM 8.9 8.6* 7.9* 8.2*  MG  --   --  2.0 2.0  PHOS  --   --   --  3.9   Liver Function Tests: Recent Labs  Lab 07/18/20 0553  AST 30  ALT 17  ALKPHOS 327*  BILITOT 1.1  PROT 5.7*  ALBUMIN 2.3*   No results for  input(s): LIPASE, AMYLASE in the last 168 hours. No results for input(s): AMMONIA in the last 168 hours. CBC: Recent Labs  Lab 07/15/20 1354 07/17/20 1320 07/18/20 0553  WBC 3.4* 3.8* 3.2*  HGB 12.5* 12.3* 10.4*  HCT 40.9 38.2* 31.4*  MCV 112.4* 107.9* 106.4*  PLT 168 223 201   Cardiac Enzymes: No results for input(s): CKTOTAL, CKMB, CKMBINDEX, TROPONINI in the last 168 hours. BNP: Invalid input(s): POCBNP CBG: Recent Labs  Lab 07/18/20 2120 07/19/20 0612 07/19/20 1112 07/19/20 1615 07/19/20 2131  GLUCAP 141* 81 85 116* 76   D-Dimer No results for input(s): DDIMER in the  last 72 hours. Hgb A1c Recent Labs    07/18/20 0553  HGBA1C 5.4   Lipid Profile No results for input(s): CHOL, HDL, LDLCALC, TRIG, CHOLHDL, LDLDIRECT in the last 72 hours. Thyroid function studies No results for input(s): TSH, T4TOTAL, T3FREE, THYROIDAB in the last 72 hours.  Invalid input(s): FREET3 Anemia work up No results for input(s): VITAMINB12, FOLATE, FERRITIN, TIBC, IRON, RETICCTPCT in the last 72 hours. Urinalysis    Component Value Date/Time   COLORURINE AMBER (A) 12/31/2015 0515   APPEARANCEUR CLOUDY (A) 12/31/2015 0515   LABSPEC 1.016 12/31/2015 0515   PHURINE 7.0 12/31/2015 0515   GLUCOSEU NEGATIVE 12/31/2015 0515   HGBUR NEGATIVE 12/31/2015 0515   BILIRUBINUR SMALL (A) 12/31/2015 0515   KETONESUR 15 (A) 12/31/2015 0515   PROTEINUR 100 (A) 12/31/2015 0515   UROBILINOGEN 2.0 (H) 08/15/2013 1916   NITRITE NEGATIVE 12/31/2015 0515   LEUKOCYTESUR NEGATIVE 12/31/2015 0515   Sepsis Labs Invalid input(s): PROCALCITONIN,  WBC,  LACTICIDVEN Microbiology Recent Results (from the past 240 hour(s))  Resp Panel by RT-PCR (Flu A&B, Covid) Nasopharyngeal Swab     Status: None   Collection Time: 07/15/20  2:22 PM   Specimen: Nasopharyngeal Swab; Nasopharyngeal(NP) swabs in vial transport medium  Result Value Ref Range Status   SARS Coronavirus 2 by RT PCR NEGATIVE NEGATIVE Final     Comment: (NOTE) SARS-CoV-2 target nucleic acids are NOT DETECTED.  The SARS-CoV-2 RNA is generally detectable in upper respiratory specimens during the acute phase of infection. The lowest concentration of SARS-CoV-2 viral copies this assay can detect is 138 copies/mL. A negative result does not preclude SARS-Cov-2 infection and should not be used as the sole basis for treatment or other patient management decisions. A negative result may occur with  improper specimen collection/handling, submission of specimen other than nasopharyngeal swab, presence of viral mutation(s) within the areas targeted by this assay, and inadequate number of viral copies(<138 copies/mL). A negative result must be combined with clinical observations, patient history, and epidemiological information. The expected result is Negative.  Fact Sheet for Patients:  EntrepreneurPulse.com.au  Fact Sheet for Healthcare Providers:  IncredibleEmployment.be  This test is no t yet approved or cleared by the [place] FDA and  has been authorized for detection and/or diagnosis of SARS-CoV-2 by FDA under an Emergency Use Authorization (EUA). This EUA will remain  in effect (meaning this test can be used) for the duration of the COVID-19 declaration under Section 564(b)(1) of the Act, 21 U.S.C.section 360bbb-3(b)(1), unless the authorization is terminated  or revoked sooner.       Influenza A by PCR NEGATIVE NEGATIVE Final   Influenza B by PCR NEGATIVE NEGATIVE Final    Comment: (NOTE) The Xpert Xpress SARS-CoV-2/FLU/RSV plus assay is intended as an aid in the diagnosis of influenza from Nasopharyngeal swab specimens and should not be used as a sole basis for treatment. Nasal washings and aspirates are unacceptable for Xpert Xpress SARS-CoV-2/FLU/RSV testing.  Fact Sheet for Patients: EntrepreneurPulse.com.au  Fact Sheet for Healthcare  Providers: IncredibleEmployment.be  This test is not yet approved or cleared by the [place] FDA and has been authorized for detection and/or diagnosis of SARS-CoV-2 by FDA under an Emergency Use Authorization (EUA). This EUA will remain in effect (meaning this test can be used) for the duration of the COVID-19 declaration under Section 564(b)(1) of the Act, 21 U.S.C. section 360bbb-3(b)(1), unless the authorization is terminated or revoked.  Performed at [facility], [address],  [name] [address]   Culture, blood (routine x 2)     Status: None (Preliminary result)   Collection Time: 07/17/20  6:00 PM   Specimen: BLOOD RIGHT HAND  Result Value Ref Range Status   Specimen Description BLOOD RIGHT HAND  Final   Special Requests   Final    BOTTLES DRAWN AEROBIC AND ANAEROBIC Blood Culture results may not be optimal due to an inadequate volume of blood received in culture bottles   Culture   Final    NO GROWTH 2 DAYS Performed at [facility], [place] [address], [place], [place] [address]    Report Status PENDING  Incomplete  Culture, blood (routine x 2)     Status: None (Preliminary result)   Collection Time: 07/17/20  6:07 PM   Specimen: BLOOD  Result Value Ref Range Status   Specimen Description BLOOD RIGHT ANTECUBITAL  Final   Special Requests   Final    BOTTLES DRAWN AEROBIC AND ANAEROBIC Blood Culture adequate volume   Culture   Final    NO GROWTH 2 DAYS Performed at [facility], [place] [address] [place], [place] [address]    Report Status PENDING  Incomplete  MRSA PCR Screening     Status: None   Collection Time: 07/18/20  5:57 AM   Specimen: Nasopharyngeal  Result Value Ref Range Status   MRSA by PCR NEGATIVE NEGATIVE Final    Comment:        The GeneXpert MRSA Assay (FDA approved for NASAL specimens only), is one component of a comprehensive MRSA colonization surveillance program. It is not intended to diagnose  MRSA infection nor to guide or monitor treatment for MRSA infections. Performed at [facility], [place] [address], [place], [place] [address]   Respiratory (~20 pathogens) panel by PCR     Status: None   Collection Time: 07/19/20  6:14 PM   Specimen: Nasopharyngeal Swab; Respiratory  Result Value Ref Range Status   Adenovirus NOT DETECTED NOT DETECTED Final   Coronavirus 229E NOT DETECTED NOT DETECTED Final    Comment: (NOTE) The Coronavirus on the Respiratory Panel, DOES NOT test for the novel  Coronavirus (2019 nCoV)    Coronavirus HKU1 NOT DETECTED NOT DETECTED Final   Coronavirus NL63 NOT DETECTED NOT DETECTED Final   Coronavirus OC43 NOT DETECTED NOT DETECTED Final   Metapneumovirus NOT DETECTED NOT DETECTED Final   Rhinovirus / Enterovirus NOT DETECTED NOT DETECTED Final   Influenza A NOT DETECTED NOT DETECTED Final   Influenza B NOT DETECTED NOT DETECTED Final   Parainfluenza Virus 1 NOT DETECTED NOT DETECTED Final   Parainfluenza Virus 2 NOT DETECTED NOT DETECTED Final   Parainfluenza Virus 3 NOT DETECTED NOT DETECTED Final   Parainfluenza Virus 4 NOT DETECTED NOT DETECTED Final   Respiratory Syncytial Virus NOT DETECTED NOT DETECTED Final   Bordetella pertussis NOT DETECTED NOT DETECTED Final   Bordetella Parapertussis NOT DETECTED NOT DETECTED Final   Chlamydophila pneumoniae NOT DETECTED NOT DETECTED Final   Mycoplasma pneumoniae NOT DETECTED NOT DETECTED Final    Comment: Performed at [facility], [place]. [address] [place], [place] [address]     Time coordinating discharge: Over 30 minutes  SIGNED:   Vianne Bulls, MD  [facility] 07/19/2020, 9:40 PM Pager   If 7PM-7AM, please contact night-coverage [email or web address] Password [email or web address]

## 2020-07-19 NOTE — Progress Notes (Signed)
  Echocardiogram 2D Echocardiogram has been performed.  [name] 07/19/2020, 8:42 AM

## 2020-07-19 NOTE — Progress Notes (Signed)
Patient attempted to get up and use the bathroom, felt nausea and threw up a brownish emesis. Did want any antiemetic. Felt better after that. Will continue to monitor.

## 2020-07-19 NOTE — Progress Notes (Signed)
PROGRESS NOTE    FERGUS THRONE  TDV:[id number] DOB: 1951/06/06 DOA: 07/17/2020 PCP: Wenda Low, MD    Brief Narrative:   70 y.o. male with history of interstitial lung disease status post single lung transplant in 2016 at [facility] presents to the ER with complaints of increasing shortness of breath ongoing for the last 2 weeks.  Patient also had a recent fall on ice about 2 weeks ago.  Patient states he has minimal productive cough.  Shortness of breath increased on exertion.  Mild chest pain on the left upper chest wall around the inferior aspect of the clavicle.  ED Course: In the ER patient underwent CT chest which shows left upper lobe masslike consolidation for which pulmonary consult was requested  Assessment & Plan:   Principal Problem:   Lung mass Active Problems:   Obstructive sleep apnea   Diabetes mellitus with complication (HCC)   Essential hypertension   ILD (interstitial lung disease) ([place])   End-stage renal disease on hemodialysis ([place])   Pneumonia  1. Lung mass differentials including possible lung abscess versus pneumonia versus malignancy.   1. Pulmonary consulted and is following 2. Continued on empiric antibiotics for possible infectious process 3. MRSA nasal swab is neg thus vancomycin d/c'd 4. Per Pulmonary, eventual transfer to [facility] when bed available.  Dr. Lamonte Sakai pulmonologist has been in contact with the transfer center and transplant specialist. 5. O2 weaned to room air 2. Lung transplant for interstitial lung disease presently on Imuran and cyclosporine and also takes Valcyte and Bactrim.   1. Pt reported that he has been weaned off the prednisone. 3. ESRD on hemodialysis 1. Dialyzes on Monday Wednesday Friday.   2. Pt is due for HD tomorrow, will consult Nephrology today for HD 4. Chronic Anemia 1. likely from ESRD 2. Follow CBC trends 5. Diabetes mellitus type 2 1. Continue on SSI coverage as  needed 2. Glucose trends are stable 6. Sleep apnea on CPAP at bedtime.  DVT prophylaxis: Heparin subq Code Status: Full Family Communication: Pt in room, family not at bedside currently  Status is: Observation  Pt requires inpt stay because: Unsafe d/c plan, IV treatments appropriate due to intensity of illness or inability to take PO and Inpatient level of care appropriate due to severity of illness  Dispo: The patient is from: Home              Anticipated d/c is to: Unclear at this time, as transfer is also being sought              Anticipated d/c date is: 3 days              Patient currently is not medically stable to d/c.   Difficult to place patient No   Consultants:   Pulmonary  Procedures:     Antimicrobials: Anti-infectives (From admission, onward)   Start     Dose/Rate Route Frequency Ordered Stop   07/20/20 1200  vancomycin (VANCOCIN) IVPB 750 mg/150 ml premix  Status:  Discontinued        750 mg 150 mL/hr over 60 Minutes Intravenous Every M-W-F (Hemodialysis) 07/18/20 0527 07/19/20 1340   07/19/20 1800  piperacillin-tazobactam (ZOSYN) IVPB 2.25 g       Note to Pharmacy: Zosyn 3.375 g IV q12h in ESRD on HD   2.25 g 100 mL/hr over 30 Minutes Intravenous Every 8 hours 07/19/20 1304     07/19/20 1400  piperacillin-tazobactam (ZOSYN) IVPB  2.25 g  Status:  Discontinued       Note to Pharmacy: Zosyn 3.375 g IV q12h in ESRD on HD   2.25 g 100 mL/hr over 30 Minutes Intravenous Every 8 hours 07/19/20 1208 07/19/20 1304   07/18/20 0600  piperacillin-tazobactam (ZOSYN) IVPB 3.375 g  Status:  Discontinued       Note to Pharmacy: Zosyn 3.375 g IV q12h in ESRD on HD   3.375 g 12.5 mL/hr over 240 Minutes Intravenous Every 12 hours 07/18/20 0009 07/19/20 1208   07/18/20 0600  vancomycin (VANCOREADY) IVPB 1500 mg/300 mL        1,500 mg 150 mL/hr over 120 Minutes Intravenous  Once 07/18/20 0527 07/18/20 1139   07/18/20 0200  piperacillin-tazobactam (ZOSYN) IVPB 2.25 g   Status:  Discontinued        2.25 g 100 mL/hr over 30 Minutes Intravenous Every 8 hours 07/17/20 1710 07/18/20 0008   07/17/20 2359  sulfamethoxazole-trimethoprim (BACTRIM) 400-80 MG per tablet 1 tablet       Note to Pharmacy: On Monday, Wednesday, Friday (dialysis days): take one tablet by mouth daily with supper     1 tablet Oral Every M-W-F (1800) 07/17/20 2113     07/17/20 2359  valGANciclovir (VALCYTE) 450 MG tablet TABS 450 mg  Status:  Discontinued       Note to Pharmacy: Take one tablet (450 mg) by mouth on Monday and Friday after dialysis     450 mg Oral Every M-W-F (1800) 07/17/20 2113 07/17/20 2340   07/17/20 2359  valGANciclovir (VALCYTE) 450 MG tablet TABS 450 mg       Note to Pharmacy: Take one tablet (450 mg) by mouth on Monday and Friday after dialysis     450 mg Oral Once per day on Mon Fri 07/17/20 2340     07/17/20 1715  piperacillin-tazobactam (ZOSYN) IVPB 3.375 g        3.375 g 100 mL/hr over 30 Minutes Intravenous  Once 07/17/20 1709 07/17/20 2034      Subjective: Feeling tired today  Objective: Vitals:   07/18/20 2202 07/18/20 2330 07/19/20 0412 07/19/20 1208  BP:  (!) 164/95 135/74 121/80  Pulse: 85 85 88 67  Resp: 14 13 14 14   Temp:  97.9 F (36.6 C) 98.8 F (37.1 C) 97.8 F (36.6 C)  TempSrc:  Axillary Oral Oral  SpO2: 90% 100% 94% 96%  Weight:      Height:        Intake/Output Summary (Last 24 hours) at 07/19/2020 1438 Last data filed at 07/19/2020 1027 Gross per 24 hour  Intake 210.28 ml  Output --  Net 210.28 ml   Filed Weights   07/18/20 1624  Weight: 67.4 kg    Examination: General exam: Conversant, in no acute distress Respiratory system: normal chest rise, clear, no audible wheezing Cardiovascular system: regular rhythm, s1-s2 Gastrointestinal system: Nondistended, nontender, pos BS Central nervous system: No seizures, no tremors Extremities: No cyanosis, no joint deformities Skin: No rashes, no pallor Psychiatry: Affect normal  // no auditory hallucinations   Data Reviewed: I have personally reviewed following labs and imaging studies  CBC: Recent Labs  Lab 07/15/20 1354 07/17/20 1320 07/18/20 0553  WBC 3.4* 3.8* 3.2*  HGB 12.5* 12.3* 10.4*  HCT 40.9 38.2* 31.4*  MCV 112.4* 107.9* 106.4*  PLT 168 223 253   Basic Metabolic Panel: Recent Labs  Lab 07/15/20 1354 07/17/20 1320 07/18/20 0553 07/19/20 0626  NA 137 139 140 137  K 4.4 4.0 4.2 4.7  CL 95* 96* 99 95*  CO2 23 26 29 28   GLUCOSE 144* 128* 72 89  BUN 8 11 20  32*  CREATININE 3.89* 3.54* 5.16* 7.21*  CALCIUM 8.9 8.6* 7.9* 8.2*  MG  --   --  2.0 2.0  PHOS  --   --   --  3.9   GFR: Estimated Creatinine Clearance: 7.6 mL/min (A) (by C-G formula based on SCr of 7.21 mg/dL (H)). Liver Function Tests: Recent Labs  Lab 07/18/20 0553  AST 30  ALT 17  ALKPHOS 327*  BILITOT 1.1  PROT 5.7*  ALBUMIN 2.3*   No results for input(s): LIPASE, AMYLASE in the last 168 hours. No results for input(s): AMMONIA in the last 168 hours. Coagulation Profile: No results for input(s): INR, PROTIME in the last 168 hours. Cardiac Enzymes: No results for input(s): CKTOTAL, CKMB, CKMBINDEX, TROPONINI in the last 168 hours. BNP (last 3 results) No results for input(s): PROBNP in the last 8760 hours. HbA1C: Recent Labs    07/18/20 0553  HGBA1C 5.4   CBG: Recent Labs  Lab 07/18/20 1155 07/18/20 1611 07/18/20 2120 07/19/20 0612 07/19/20 1112  GLUCAP 72 119* 141* 81 85   Lipid Profile: No results for input(s): CHOL, HDL, LDLCALC, TRIG, CHOLHDL, LDLDIRECT in the last 72 hours. Thyroid Function Tests: No results for input(s): TSH, T4TOTAL, FREET4, T3FREE, THYROIDAB in the last 72 hours. Anemia Panel: No results for input(s): VITAMINB12, FOLATE, FERRITIN, TIBC, IRON, RETICCTPCT in the last 72 hours. Sepsis Labs: Recent Labs  Lab 07/17/20 1704 07/18/20 0553 07/19/20 0626  PROCALCITON 1.13 1.13 1.08    Recent Results (from the past 240 hour(s))   Resp Panel by RT-PCR (Flu A&B, Covid) Nasopharyngeal Swab     Status: None   Collection Time: 07/15/20  2:22 PM   Specimen: Nasopharyngeal Swab; Nasopharyngeal(NP) swabs in vial transport medium  Result Value Ref Range Status   SARS Coronavirus 2 by RT PCR NEGATIVE NEGATIVE Final    Comment: (NOTE) SARS-CoV-2 target nucleic acids are NOT DETECTED.  The SARS-CoV-2 RNA is generally detectable in upper respiratory specimens during the acute phase of infection. The lowest concentration of SARS-CoV-2 viral copies this assay can detect is 138 copies/mL. A negative result does not preclude SARS-Cov-2 infection and should not be used as the sole basis for treatment or other patient management decisions. A negative result may occur with  improper specimen collection/handling, submission of specimen other than nasopharyngeal swab, presence of viral mutation(s) within the areas targeted by this assay, and inadequate number of viral copies(<138 copies/mL). A negative result must be combined with clinical observations, patient history, and epidemiological information. The expected result is Negative.  Fact Sheet for Patients:  EntrepreneurPulse.com.au  Fact Sheet for Healthcare Providers:  IncredibleEmployment.be  This test is no t yet approved or cleared by the [place] FDA and  has been authorized for detection and/or diagnosis of SARS-CoV-2 by FDA under an Emergency Use Authorization (EUA). This EUA will remain  in effect (meaning this test can be used) for the duration of the COVID-19 declaration under Section 564(b)(1) of the Act, 21 U.S.C.section 360bbb-3(b)(1), unless the authorization is terminated  or revoked sooner.       Influenza A by PCR NEGATIVE NEGATIVE Final   Influenza B by PCR NEGATIVE NEGATIVE Final    Comment: (NOTE) The Xpert Xpress SARS-CoV-2/FLU/RSV plus assay is intended as an aid in the diagnosis of influenza from  Nasopharyngeal swab specimens and should  not be used as a sole basis for treatment. Nasal washings and aspirates are unacceptable for Xpert Xpress SARS-CoV-2/FLU/RSV testing.  Fact Sheet for Patients: EntrepreneurPulse.com.au  Fact Sheet for Healthcare Providers: IncredibleEmployment.be  This test is not yet approved or cleared by the [place] FDA and has been authorized for detection and/or diagnosis of SARS-CoV-2 by FDA under an Emergency Use Authorization (EUA). This EUA will remain in effect (meaning this test can be used) for the duration of the COVID-19 declaration under Section 564(b)(1) of the Act, 21 U.S.C. section 360bbb-3(b)(1), unless the authorization is terminated or revoked.  Performed at [facility], [place] [address], [place], [place] [address]   Culture, blood (routine x 2)     Status: None (Preliminary result)   Collection Time: 07/17/20  6:00 PM   Specimen: BLOOD RIGHT HAND  Result Value Ref Range Status   Specimen Description BLOOD RIGHT HAND  Final   Special Requests   Final    BOTTLES DRAWN AEROBIC AND ANAEROBIC Blood Culture results may not be optimal due to an inadequate volume of blood received in culture bottles   Culture   Final    NO GROWTH < 24 HOURS Performed at [facility], [place] [address], [place], [place] [address]    Report Status PENDING  Incomplete  Culture, blood (routine x 2)     Status: None (Preliminary result)   Collection Time: 07/17/20  6:07 PM   Specimen: BLOOD  Result Value Ref Range Status   Specimen Description BLOOD RIGHT ANTECUBITAL  Final   Special Requests   Final    BOTTLES DRAWN AEROBIC AND ANAEROBIC Blood Culture adequate volume   Culture   Final    NO GROWTH < 24 HOURS Performed at [facility], [place] [address] [place], [place] [address]    Report Status PENDING  Incomplete  MRSA PCR Screening     Status: None   Collection Time: 07/18/20  5:57 AM   Specimen:  Nasopharyngeal  Result Value Ref Range Status   MRSA by PCR NEGATIVE NEGATIVE Final    Comment:        The GeneXpert MRSA Assay (FDA approved for NASAL specimens only), is one component of a comprehensive MRSA colonization surveillance program. It is not intended to diagnose MRSA infection nor to guide or monitor treatment for MRSA infections. Performed at [facility], [place] [address] [place], [place] [address]      Radiology Studies: CT ANGIO CHEST PE W OR WO CONTRAST  Result Date: 07/18/2020 CLINICAL DATA:  PE suspected. EXAM: CT ANGIOGRAPHY CHEST WITH CONTRAST TECHNIQUE: Multidetector CT imaging of the chest was performed using the standard protocol during bolus administration of intravenous contrast. Multiplanar CT image reconstructions and MIPs were obtained to evaluate the vascular anatomy. CONTRAST:  163mL OMNIPAQUE IOHEXOL 350 MG/ML SOLN COMPARISON:  07/15/2020 FINDINGS: Cardiovascular: Contrast injection is sufficient to demonstrate satisfactory opacification of the pulmonary arteries to the segmental level. There is no pulmonary embolus or evidence of right heart strain. There are atherosclerotic changes throughout the thoracic aorta without evidence for an aneurysm or dissection. The patient is status post prior CABG. There are coronary artery calcifications. Mediastinum/Nodes: -- No mediastinal lymphadenopathy. -- No hilar lymphadenopathy. -- No axillary lymphadenopathy. -- No supraclavicular lymphadenopathy. -- Normal thyroid gland where visualized. -  Unremarkable esophagus. Lungs/Pleura: Again noted is a masslike area of consolidation involving the left upper lobe. Advanced fibrotic changes are again noted of the left lung  field with significant left-sided volume loss as before. There is a small amount of debris within the trachea. No right-sided pneumothorax. There are trace bilateral pleural effusions. Upper Abdomen: Contrast bolus timing is not optimized for evaluation of  the abdominal organs. Again noted are findings suggestive of cirrhosis. There is cholelithiasis. There are probable nonobstructing stones in the upper pole the right kidney. Musculoskeletal: No chest wall abnormality. No bony spinal canal stenosis. Review of the MIP images confirms the above findings. IMPRESSION: 1. No evidence for acute pulmonary embolus. 2. No significant oval change in left-sided lung findings including a left upper lobe lung mass. 3. Probable cirrhosis and cholelithiasis involving the partially visualized upper abdomen. Aortic Atherosclerosis (ICD10-I70.0). Electronically Signed   By: Constance Holster M.D.   On: 07/18/2020 19:58   DG Chest Port 1 View  Result Date: 07/19/2020 CLINICAL DATA:  Difficulty breathing.  Concern for aspiration. EXAM: PORTABLE CHEST 1 VIEW COMPARISON:  Chest radiograph and chest CT July 18, 2020 FINDINGS: There is extensive fibrosis throughout the left lung. Consolidation left apex region with questionable mass in this area, unchanged. There is mild right base atelectasis. Right lung otherwise clear. Postoperative change consistent with prior right lung transplant on the right. Heart size is within normal limits. Distortion of pulmonary vascularity on the left due to fibrosis is stable. Stents on the left are stable. There is aortic atherosclerosis. No bone lesions. No adenopathy appreciable by radiography. No bone lesions. IMPRESSION: Extensive fibrosis on the left with persistent opacity in the left apex. CT shows evidence suggesting potential mass with associated consolidation in the left apex. Mild atelectasis right base. Right lung otherwise clear. Stable cardiac silhouette. Postoperative changes noted. Aortic Atherosclerosis (ICD10-I70.0). Electronically Signed   By: Lowella Grip III M.D.   On: 07/19/2020 08:53   DG CHEST PORT 1 VIEW  Result Date: 07/18/2020 CLINICAL DATA:  70 year old male with possible pneumonia. Left apical masslike opacity.  History of lung transplant. EXAM: PORTABLE CHEST 1 VIEW COMPARISON:  Chest CT 07/15/2020 and earlier. FINDINGS: Portable AP semi upright view at 0510 hours. Similar patient rotation to the left. Stable superior mediastinal vascular stent. Coarse pulmonary reticulonodular density throughout the left lung in keeping with the fibrosis seen on CT. Confluent left apical opacity persists. No pneumothorax. Stable cardiac size and mediastinal contours. Right lung appears stable and negative. Stable visualized osseous structures. IMPRESSION: 1. Stable from the chest CT 05/14/2021 (please see that report). Mass-like left apical opacity superimposed on chronic fibrotic left lung disease. 2. Negative right lung. Electronically Signed   By: Genevie Ann M.D.   On: 07/18/2020 06:01   ECHOCARDIOGRAM COMPLETE  Result Date: 07/19/2020    ECHOCARDIOGRAM REPORT   Patient Name:   RENATO SPELLMAN [facility] Date of Exam: 07/19/2020 Medical Rec #:  [id number]         Height:       63.0 in Accession #:    [id number]        Weight:       148.6 lb Date of Birth:  17-Nov-1950        BSA:          1.704 m Patient Age:    69 years          BP:           135/74 mmHg Patient Gender: M                 HR:  78 bpm. Exam Location:  Inpatient Procedure: 2D Echo Indications:    acute respiratory distress  History:        Patient has prior history of Echocardiogram examinations, most                 recent 03/04/2013. End stage renal disease; Risk Factors:Sleep                 Apnea, Hypertension and Diabetes.  Sonographer:    Johny Chess Referring Phys: [place]  1. Left ventricular ejection fraction, by estimation, is 70 to 75%. The left ventricle has hyperdynamic function. The left ventricle has no regional wall motion abnormalities. There is mild concentric left ventricular hypertrophy. Left ventricular diastolic parameters are consistent with Grade I diastolic dysfunction (impaired relaxation).  2. RV incompletely  visualized. Based on limited views, the right ventricular systolic function is normal. The right ventricular size is normal. There is normal pulmonary artery systolic pressure.  3. The mitral valve is abnormal. Trivial mitral valve regurgitation. Moderate mitral annular calcification.  4. The aortic valve is tricuspid. There is moderate calcification of the aortic valve. There is moderate thickening of the aortic valve. Aortic valve regurgitation is mild. Mild aortic valve stenosis.  5. The inferior vena cava is normal in size with greater than 50% respiratory variability, suggesting right atrial pressure of 3 mmHg. Comparison(s): Compared to prior echo report in 2014, there is now mild aortic stenosis. FINDINGS  Left Ventricle: Left ventricular ejection fraction, by estimation, is 70 to 75%. The left ventricle has hyperdynamic function. The left ventricle has no regional wall motion abnormalities. The left ventricular internal cavity size was normal in size. There is mild concentric left ventricular hypertrophy. Left ventricular diastolic parameters are consistent with Grade I diastolic dysfunction (impaired relaxation). Right Ventricle: RV incompletely visualized. Based on limited views, the right ventricular size is normal. Right vetricular wall thickness was not well visualized. Right ventricular systolic function is normal. There is normal pulmonary artery systolic pressure. The tricuspid regurgitant velocity is 2.61 m/s, and with an assumed right atrial pressure of 3 mmHg, the estimated right ventricular systolic pressure is 06.3 mmHg. Left Atrium: Left atrial size was normal in size. Right Atrium: Right atrial size was normal in size. Pericardium: There is no evidence of pericardial effusion. Mitral Valve: The mitral valve is abnormal. There is mild thickening of the mitral valve leaflet(s). There is mild calcification of the mitral valve leaflet(s). Moderate mitral annular calcification. Trivial mitral valve  regurgitation. Tricuspid Valve: The tricuspid valve is normal in structure. Tricuspid valve regurgitation is mild. Aortic Valve: The aortic valve is tricuspid. There is moderate calcification of the aortic valve. There is moderate thickening of the aortic valve. Aortic valve regurgitation is mild. Mild aortic stenosis is present. Aortic valve mean gradient measures 9.0 mmHg. Aortic valve peak gradient measures 15.7 mmHg. Aortic valve area, by VTI measures 1.59 cm. Pulmonic Valve: The pulmonic valve was normal in structure. Pulmonic valve regurgitation is not visualized. Aorta: The aortic root and ascending aorta are structurally normal, with no evidence of dilitation. Venous: The inferior vena cava is normal in size with greater than 50% respiratory variability, suggesting right atrial pressure of 3 mmHg. IAS/Shunts: No atrial level shunt detected by color flow Doppler.  LEFT VENTRICLE PLAX 2D LVIDd:         3.60 cm  Diastology LVIDs:         2.00 cm  LV e' medial:    5.44  cm/s LV PW:         0.90 cm  LV E/e' medial:  17.5 LV IVS:        1.20 cm  LV e' lateral:   8.59 cm/s LVOT diam:     1.90 cm  LV E/e' lateral: 11.1 LV SV:         56 LV SV Index:   33 LVOT Area:     2.84 cm  RIGHT VENTRICLE             IVC RV S prime:     12.30 cm/s  IVC diam: 1.30 cm TAPSE (M-mode): 1.4 cm LEFT ATRIUM           Index       RIGHT ATRIUM           Index LA diam:      3.70 cm 2.17 cm/m  RA Area:     12.40 cm LA Vol (A2C): 34.4 ml 20.18 ml/m RA Volume:   28.90 ml  16.96 ml/m  AORTIC VALVE AV Area (Vmax):    1.52 cm AV Area (Vmean):   1.44 cm AV Area (VTI):     1.59 cm AV Vmax:           198.00 cm/s AV Vmean:          137.000 cm/s AV VTI:            0.355 m AV Peak Grad:      15.7 mmHg AV Mean Grad:      9.0 mmHg LVOT Vmax:         106.25 cm/s LVOT Vmean:        69.400 cm/s LVOT VTI:          0.199 m LVOT/AV VTI ratio: 0.56  AORTA Ao Root diam: 2.80 cm Ao Asc diam:  2.90 cm MITRAL VALVE                TRICUSPID VALVE MV Area  (PHT): 2.66 cm     TR Peak grad:   27.2 mmHg MV Decel Time: 285 msec     TR Vmax:        261.00 cm/s MV E velocity: 95.10 cm/s MV A velocity: 108.00 cm/s  SHUNTS MV E/A ratio:  0.88         Systemic VTI:  0.20 m                             Systemic Diam: 1.90 cm Gwyndolyn Kaufman MD Electronically signed by Gwyndolyn Kaufman MD Signature Date/Time: 07/19/2020/11:29:29 AM    Final     Scheduled Meds: . Alpha Lipoic Acid  200-400 mg Oral See admin instructions  . aspirin EC  81 mg Oral Q breakfast  . azaTHIOprine  50 mg Oral Q M,W,F-1800  . calcium acetate  667 mg Oral TID WC  . Chlorhexidine Gluconate Cloth  6 each Topical Daily  . [START ON 07/20/2020] Chlorhexidine Gluconate Cloth  6 each Topical Q0600  . cycloSPORINE modified  100 mg Oral QHS  . cycloSPORINE modified  75 mg Oral Daily  . dorzolamide-timolol  1 drop Both Eyes BID  . [START ON 07/20/2020] doxercalciferol  3 mcg Intravenous Q M,W,F-HD  . [START ON 07/20/2020] ferrous sulfate  325 mg Oral Q M,W,F   And  . ferrous sulfate  325 mg Oral Q T,Th,S,Su  . guaiFENesin  1,200 mg Oral BID  . heparin  5,000 Units Subcutaneous Q8H  . insulin aspart  0-6 Units Subcutaneous TID WC  . [START ON 07/20/2020] midodrine  10 mg Oral Q M,W,F  . pantoprazole  40 mg Oral Daily  . pravastatin  10 mg Oral QHS  . sulfamethoxazole-trimethoprim  1 tablet Oral Q M,W,F-1800  . valGANciclovir  450 mg Oral Once per day on Mon Fri  . vitamin B-6  25 mg Oral Q breakfast   Continuous Infusions: . piperacillin-tazobactam (ZOSYN)  IV       LOS: 1 day   Marylu Lund, MD [facility] Pager On Amion  If 7PM-7AM, please contact night-coverage 07/19/2020, 2:38 PM

## 2020-07-19 NOTE — Progress Notes (Addendum)
Pharmacy Antibiotic Note  [name] is a 70 y.o. male with ESRD on HD MWF admitted on 07/17/2020 with lung mass, possible PNA.  Pharmacy has been consulted for Vancomycin and Zosyn dosing.    Adjusting Zosyn dosing due to renal function. Vanc d/c'd by provider due to negative MRSA PCR.  Plan: D/c vancomycin maintenance doses Change Zosyn to 2.25g IV q8 hours Height: 5\' 3"  (160 cm) Weight: 67.4 kg (148 lb 9.4 oz) (bed scale) IBW/kg (Calculated) : 56.9  Temp (24hrs), Avg:98.2 F (36.8 C), Min:97.8 F (36.6 C), Max:98.8 F (37.1 C)  Recent Labs  Lab 07/15/20 1354 07/17/20 1320 07/18/20 0553 07/19/20 0626  WBC 3.4* 3.8* 3.2*  --   CREATININE 3.89* 3.54* 5.16* 7.21*    Estimated Creatinine Clearance: 7.6 mL/min (A) (by C-G formula based on SCr of 7.21 mg/dL (H)).    Allergies  Allergen Reactions  . Levofloxacin Other (See Comments)    LOSS OF CONSCIOUSNESS  . Nsaids Other (See Comments)    Patient instructed not to take NSAID's after his lung transplant     Dimple Nanas, PharmD PGY-1 Acute Care Pharmacy Resident Office: [phone] 07/19/2020 12:09 PM

## 2020-07-20 ENCOUNTER — Telehealth (HOSPITAL_COMMUNITY): Payer: Self-pay | Admitting: Pulmonary Disease

## 2020-07-20 DIAGNOSIS — I10 Essential (primary) hypertension: Secondary | ICD-10-CM | POA: Diagnosis not present

## 2020-07-20 DIAGNOSIS — R0902 Hypoxemia: Secondary | ICD-10-CM | POA: Diagnosis not present

## 2020-07-20 DIAGNOSIS — R918 Other nonspecific abnormal finding of lung field: Secondary | ICD-10-CM | POA: Diagnosis not present

## 2020-07-20 DIAGNOSIS — Z992 Dependence on renal dialysis: Secondary | ICD-10-CM | POA: Diagnosis not present

## 2020-07-20 DIAGNOSIS — N186 End stage renal disease: Secondary | ICD-10-CM | POA: Diagnosis not present

## 2020-07-20 LAB — GLUCOSE, CAPILLARY
Glucose-Capillary: 82 mg/dL (ref 70–99)
Glucose-Capillary: 65 mg/dL — ABNORMAL LOW (ref 70–99)
Glucose-Capillary: 75 mg/dL (ref 70–99)
Glucose-Capillary: 115 mg/dL — ABNORMAL HIGH (ref 70–99)
Glucose-Capillary: 208 mg/dL — ABNORMAL HIGH (ref 70–99)

## 2020-07-20 LAB — COMPREHENSIVE METABOLIC PANEL
ALT: 13 U/L (ref 0–44)
AST: 26 U/L (ref 15–41)
Albumin: 2 g/dL — ABNORMAL LOW (ref 3.5–5.0)
Alkaline Phosphatase: 285 U/L — ABNORMAL HIGH (ref 38–126)
Anion gap: 18 — ABNORMAL HIGH (ref 5–15)
BUN: 48 mg/dL — ABNORMAL HIGH (ref 8–23)
CO2: 24 mmol/L (ref 22–32)
Calcium: 8.1 mg/dL — ABNORMAL LOW (ref 8.9–10.3)
Chloride: 94 mmol/L — ABNORMAL LOW (ref 98–111)
Creatinine, Ser: 9.54 mg/dL — ABNORMAL HIGH (ref 0.61–1.24)
GFR, Estimated: 5 mL/min — ABNORMAL LOW (ref >60)
Glucose, Bld: 82 mg/dL (ref 70–99)
Potassium: 5.2 mmol/L — ABNORMAL HIGH (ref 3.5–5.1)
Sodium: 136 mmol/L (ref 135–145)
Total Bilirubin: 2.3 mg/dL — ABNORMAL HIGH (ref 0.3–1.2)
Total Protein: 5.9 g/dL — ABNORMAL LOW (ref 6.5–8.1)

## 2020-07-20 LAB — SAR COV2 SEROLOGY (COVID19)AB(IGG),IA: SARS-CoV-2 Ab, IgG: NONREACTIVE

## 2020-07-20 MED ORDER — SODIUM ZIRCONIUM CYCLOSILICATE 10 G PO PACK
10.0000 g | PACK | Freq: Every day | ORAL | Status: DC
  Administered 2020-07-20 – 2020-07-21 (×2): 10 g via ORAL
  Filled 2020-07-20 (×3): qty 1

## 2020-07-20 NOTE — Progress Notes (Signed)
PROGRESS NOTE    [name]  [name]:[id number] DOB: 02-11-1951 DOA: 07/17/2020 PCP: Wenda Low, MD    Brief Narrative:   70 y.o. 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  Patient also had a recent fall on ice about 2 weeks ago.  Patient states he has minimal productive cough.  Shortness of breath increased on exertion.  Mild chest pain on the left upper chest wall around the inferior aspect of the clavicle.  ED Course: In the ER patient underwent CT chest which shows left upper lobe masslike consolidation for which pulmonary consult was requested  Assessment & Plan:   Principal Problem:   Lung mass Active Problems:   Obstructive sleep apnea   Diabetes mellitus with complication (HCC)   Essential hypertension   ILD (interstitial lung disease) (HCC)   End-stage renal disease on hemodialysis (HCC)   Pneumonia   Exercise hypoxemia  1. Lung mass differentials including possible lung abscess versus pneumonia versus malignancy.  1. Pulmonary consulted and is following 2. Continued on empiric antibiotics for possible infectious process 3. MRSA nasal swab is neg thus vancomycin d/c'd 4. Per Pulmonary, initial plan was forl transfer to [facility] when bed available. 5. O2 was weaned to room air 6. PCCM discussed with [facility] transplant today. As pt has improved, plan for d/c home with augmentin with close outpt f/u with [facility] transplant  7. Discussed with PCCM. From Pulmonary standpoint, pt is clear for d/c as early as today 2. Lung transplant for interstitial lung disease presently on Imuran and cyclosporine and also takes Valcyte and Bactrim.  1. Pt reported that he has been weaned off the prednisone. 3. ESRD on hemodialysis 1. Dialyzes on Monday Wednesday Friday.  2. Plan for  HD today 4. Chronic Anemia 1. likely from ESRD 2. Follow CBC trends 5. Diabetes mellitus type 2 1. Continue on SSI coverage as needed 2. Glucose trends are stable thus far 6. Sleep apnea on CPAP at bedtime.  DVT prophylaxis: Heparin subq Code Status: Full Family Communication: Pt in room, family not at bedside currently  Status is: Observation  Pt requires inpt stay because: Unsafe d/c plan, IV treatments appropriate due to intensity of illness or inability to take PO and Inpatient level of care appropriate due to severity of illness  Dispo: The patient is from: Home              Anticipated d/c is to: Unclear at this time, as transfer is also being sought              Anticipated d/c date is: 3 days              Patient currently is not medically stable to d/c.   Difficult to place patient No   Consultants:   Pulmonary  Procedures:     Antimicrobials: Anti-infectives (From admission, onward)   Start     Dose/Rate Route Frequency Ordered Stop   07/20/20 1200  vancomycin (VANCOCIN) IVPB 750 mg/150 ml premix  Status:  Discontinued        750 mg 150 mL/hr over 60 Minutes Intravenous Every M-W-F (Hemodialysis) 07/18/20 0527 07/19/20 1340   07/19/20 1800  piperacillin-tazobactam (ZOSYN) IVPB 2.25 g       Note to Pharmacy: Zosyn 3.375 g IV q12h in ESRD on HD  2.25 g 100 mL/hr over 30 Minutes Intravenous Every 8 hours 07/19/20 1304     07/19/20 1400  piperacillin-tazobactam (ZOSYN) IVPB 2.25 g  Status:  Discontinued       Note to Pharmacy: Zosyn 3.375 g IV q12h in ESRD on HD   2.25 g 100 mL/hr over 30 Minutes Intravenous Every 8 hours 07/19/20 1208 07/19/20 1304   07/18/20 0600  piperacillin-tazobactam (ZOSYN) IVPB 3.375 g  Status:  Discontinued       Note to Pharmacy: Zosyn 3.375 g IV q12h in ESRD on HD   3.375 g 12.5 mL/hr over 240 Minutes Intravenous Every 12 hours 07/18/20 0009 07/19/20 1208   07/18/20 0600  vancomycin (VANCOREADY) IVPB 1500 mg/300 mL        1,500  mg 150 mL/hr over 120 Minutes Intravenous  Once 07/18/20 0527 07/18/20 1139   07/18/20 0200  piperacillin-tazobactam (ZOSYN) IVPB 2.25 g  Status:  Discontinued        2.25 g 100 mL/hr over 30 Minutes Intravenous Every 8 hours 07/17/20 1710 07/18/20 0008   07/17/20 2359  sulfamethoxazole-trimethoprim (BACTRIM) 400-80 MG per tablet 1 tablet       Note to Pharmacy: On Monday, Wednesday, Friday (dialysis days): take one tablet by mouth daily with supper     1 tablet Oral Every M-W-F (1800) 07/17/20 2113     07/17/20 2359  valGANciclovir (VALCYTE) 450 MG tablet TABS 450 mg  Status:  Discontinued       Note to Pharmacy: Take one tablet (450 mg) by mouth on Monday and Friday after dialysis     450 mg Oral Every M-W-F (1800) 07/17/20 2113 07/17/20 2340   07/17/20 2359  valGANciclovir (VALCYTE) 450 MG tablet TABS 450 mg       Note to Pharmacy: Take one tablet (450 mg) by mouth on Monday and Friday after dialysis     450 mg Oral Once per day on Mon Fri 07/17/20 2340     07/17/20 1715  piperacillin-tazobactam (ZOSYN) IVPB 3.375 g        3.375 g 100 mL/hr over 30 Minutes Intravenous  Once 07/17/20 1709 07/17/20 2034      Subjective: Reports feeling better today  Objective: Vitals:   07/19/20 2258 07/20/20 0022 07/20/20 0450 07/20/20 1236  BP:  126/72 123/67 (!) 149/77  Pulse: 68 93 86 84  Resp: 16 18 18 16   Temp:  97.8 F (36.6 C) 98.5 F (36.9 C) 98.7 F (37.1 C)  TempSrc:  Axillary Oral Oral  SpO2: 97% 94% 97%   Weight:      Height:        Intake/Output Summary (Last 24 hours) at 07/20/2020 1702 Last data filed at 07/20/2020 1500 Gross per 24 hour  Intake 1076.93 ml  Output --  Net 1076.93 ml   Filed Weights   07/18/20 1624  Weight: 67.4 kg    Examination: General exam: Awake, laying in bed, in nad Respiratory system: Normal respiratory effort, no wheezing Cardiovascular system: regular rate, s1, s2 Gastrointestinal system: Soft, nondistended, positive BS Central nervous  system: CN2-12 grossly intact, strength intact Extremities: Perfused, no clubbing Skin: Normal skin turgor, no notable skin lesions seen Psychiatry: Mood normal // no visual hallucinations   Data Reviewed: I have personally reviewed following labs and imaging studies  CBC: Recent Labs  Lab 07/15/20 1354 07/17/20 1320 07/18/20 0553  WBC 3.4* 3.8* 3.2*  HGB 12.5* 12.3* 10.4*  HCT 40.9 38.2* 31.4*  MCV 112.4* 107.9* 106.4*  PLT  168 223 716   Basic Metabolic Panel: Recent Labs  Lab 07/15/20 1354 07/17/20 1320 07/18/20 0553 07/19/20 0626 07/20/20 0436  NA 137 139 140 137 136  K 4.4 4.0 4.2 4.7 5.2*  CL 95* 96* 99 95* 94*  CO2 23 26 29 28 24   GLUCOSE 144* 128* 72 89 82  BUN 8 11 20  32* 48*  CREATININE 3.89* 3.54* 5.16* 7.21* 9.54*  CALCIUM 8.9 8.6* 7.9* 8.2* 8.1*  MG  --   --  2.0 2.0  --   PHOS  --   --   --  3.9  --    GFR: Estimated Creatinine Clearance: 5.7 mL/min (A) (by C-G formula based on SCr of 9.54 mg/dL (H)). Liver Function Tests: Recent Labs  Lab 07/18/20 0553 07/20/20 0436  AST 30 26  ALT 17 13  ALKPHOS 327* 285*  BILITOT 1.1 2.3*  PROT 5.7* 5.9*  ALBUMIN 2.3* 2.0*   No results for input(s): LIPASE, AMYLASE in the last 168 hours. No results for input(s): AMMONIA in the last 168 hours. Coagulation Profile: No results for input(s): INR, PROTIME in the last 168 hours. Cardiac Enzymes: No results for input(s): CKTOTAL, CKMB, CKMBINDEX, TROPONINI in the last 168 hours. BNP (last 3 results) No results for input(s): PROBNP in the last 8760 hours. HbA1C: Recent Labs    07/18/20 0553  HGBA1C 5.4   CBG: Recent Labs  Lab 07/19/20 2131 07/20/20 0643 07/20/20 1100 07/20/20 1126 07/20/20 1603  GLUCAP 76 82 65* 75 115*   Lipid Profile: No results for input(s): CHOL, HDL, LDLCALC, TRIG, CHOLHDL, LDLDIRECT in the last 72 hours. Thyroid Function Tests: No results for input(s): TSH, T4TOTAL, FREET4, T3FREE, THYROIDAB in the last 72 hours. Anemia  Panel: No results for input(s): VITAMINB12, FOLATE, FERRITIN, TIBC, IRON, RETICCTPCT in the last 72 hours. Sepsis Labs: Recent Labs  Lab 07/17/20 1704 07/18/20 0553 07/19/20 0626  PROCALCITON 1.13 1.13 1.08    Recent Results (from the past 240 hour(s))  Resp Panel by RT-PCR (Flu A&B, Covid) Nasopharyngeal Swab     Status: None   Collection Time: 07/15/20  2:22 PM   Specimen: Nasopharyngeal Swab; Nasopharyngeal(NP) swabs in vial transport medium  Result Value Ref Range Status   SARS Coronavirus 2 by RT PCR NEGATIVE NEGATIVE Final    Comment: (NOTE) SARS-CoV-2 target nucleic acids are NOT DETECTED.  The SARS-CoV-2 RNA is generally detectable in upper respiratory specimens during the acute phase of infection. The lowest concentration of SARS-CoV-2 viral copies this assay can detect is 138 copies/mL. A negative result does not preclude SARS-Cov-2 infection and should not be used as the sole basis for treatment or other patient management decisions. A negative result may occur with  improper specimen collection/handling, submission of specimen other than nasopharyngeal swab, presence of viral mutation(s) within the areas targeted by this assay, and inadequate number of viral copies(<138 copies/mL). A negative result must be combined with clinical observations, patient history, and epidemiological information. The expected result is Negative.  Fact Sheet for Patients:  EntrepreneurPulse.com.au  Fact Sheet for Healthcare Providers:  IncredibleEmployment.be  This test is no t yet approved or cleared by the [place] FDA and  has been authorized for detection and/or diagnosis of SARS-CoV-2 by FDA under an Emergency Use Authorization (EUA). This EUA will remain  in effect (meaning this test can be used) for the duration of the COVID-19 declaration under Section 564(b)(1) of the Act, 21 U.S.C.section 360bbb-3(b)(1), unless the authorization is  terminated  or  revoked sooner.       Influenza A by PCR NEGATIVE NEGATIVE Final   Influenza B by PCR NEGATIVE NEGATIVE Final    Comment: (NOTE) The Xpert Xpress SARS-CoV-2/FLU/RSV plus assay is intended as an aid in the diagnosis of influenza from Nasopharyngeal swab specimens and should not be used as a sole basis for treatment. Nasal washings and aspirates are unacceptable for Xpert Xpress SARS-CoV-2/FLU/RSV testing.  Fact Sheet for Patients: EntrepreneurPulse.com.au  Fact Sheet for Healthcare Providers: IncredibleEmployment.be  This test is not yet approved or cleared by the [place] FDA and has been authorized for detection and/or diagnosis of SARS-CoV-2 by FDA under an Emergency Use Authorization (EUA). This EUA will remain in effect (meaning this test can be used) for the duration of the COVID-19 declaration under Section 564(b)(1) of the Act, 21 U.S.C. section 360bbb-3(b)(1), unless the authorization is terminated or revoked.  Performed at [facility], [place] [address], [place], [place] [address]   Culture, blood (routine x 2)     Status: None (Preliminary result)   Collection Time: 07/17/20  6:00 PM   Specimen: BLOOD RIGHT HAND  Result Value Ref Range Status   Specimen Description BLOOD RIGHT HAND  Final   Special Requests   Final    BOTTLES DRAWN AEROBIC AND ANAEROBIC Blood Culture results may not be optimal due to an inadequate volume of blood received in culture bottles   Culture   Final    NO GROWTH 3 DAYS Performed at [facility], [place] [address] [place] [address] [place], [place] [address]    Report Status PENDING  Incomplete  Culture, blood (routine x 2)     Status: None (Preliminary result)   Collection Time: 07/17/20  6:07 PM   Specimen: BLOOD  Result Value Ref Range Status   Specimen Description BLOOD RIGHT ANTECUBITAL  Final   Special Requests   Final    BOTTLES DRAWN AEROBIC AND ANAEROBIC Blood Culture adequate  volume   Culture   Final    NO GROWTH 3 DAYS Performed at [facility], [place] [address] [place] [address], [place], [place] [address]    Report Status PENDING  Incomplete  MRSA PCR Screening     Status: None   Collection Time: 07/18/20  5:57 AM   Specimen: Nasopharyngeal  Result Value Ref Range Status   MRSA by PCR NEGATIVE NEGATIVE Final    Comment:        The GeneXpert MRSA Assay (FDA approved for NASAL specimens only), is one component of a comprehensive MRSA colonization surveillance program. It is not intended to diagnose MRSA infection nor to guide or monitor treatment for MRSA infections. Performed at [facility], [place] [address] [place], [place] [address]   Respiratory (~20 pathogens) panel by PCR     Status: None   Collection Time: 07/19/20  6:14 PM   Specimen: Nasopharyngeal Swab; Respiratory  Result Value Ref Range Status   Adenovirus NOT DETECTED NOT DETECTED Final   Coronavirus 229E NOT DETECTED NOT DETECTED Final    Comment: (NOTE) The Coronavirus on the Respiratory Panel, DOES NOT test for the novel  Coronavirus (2019 nCoV)    Coronavirus HKU1 NOT DETECTED NOT DETECTED Final   Coronavirus NL63 NOT DETECTED NOT DETECTED Final   Coronavirus OC43 NOT DETECTED NOT DETECTED Final   Metapneumovirus NOT DETECTED NOT DETECTED Final   Rhinovirus / Enterovirus NOT DETECTED NOT DETECTED Final   Influenza A NOT DETECTED NOT DETECTED Final   Influenza B  NOT DETECTED NOT DETECTED Final   Parainfluenza Virus 1 NOT DETECTED NOT DETECTED Final   Parainfluenza Virus 2 NOT DETECTED NOT DETECTED Final   Parainfluenza Virus 3 NOT DETECTED NOT DETECTED Final   Parainfluenza Virus 4 NOT DETECTED NOT DETECTED Final   Respiratory Syncytial Virus NOT DETECTED NOT DETECTED Final   Bordetella pertussis NOT DETECTED NOT DETECTED Final   Bordetella Parapertussis NOT DETECTED NOT DETECTED Final   Chlamydophila pneumoniae NOT DETECTED NOT DETECTED Final   Mycoplasma pneumoniae NOT DETECTED  NOT DETECTED Final    Comment: Performed at [facility], [place] [address], [place], [place] [address]     Radiology Studies: CT ANGIO CHEST PE W OR WO CONTRAST  Result Date: 07/18/2020 CLINICAL DATA:  PE suspected. EXAM: CT ANGIOGRAPHY CHEST WITH CONTRAST TECHNIQUE: Multidetector CT imaging of the chest was performed using the standard protocol during bolus administration of intravenous contrast. Multiplanar CT image reconstructions and MIPs were obtained to evaluate the vascular anatomy. CONTRAST:  13mL OMNIPAQUE IOHEXOL 350 MG/ML SOLN COMPARISON:  07/15/2020 FINDINGS: Cardiovascular: Contrast injection is sufficient to demonstrate satisfactory opacification of the pulmonary arteries to the segmental level. There is no pulmonary embolus or evidence of right heart strain. There are atherosclerotic changes throughout the thoracic aorta without evidence for an aneurysm or dissection. The patient is status post prior CABG. There are coronary artery calcifications. Mediastinum/Nodes: -- No mediastinal lymphadenopathy. -- No hilar lymphadenopathy. -- No axillary lymphadenopathy. -- No supraclavicular lymphadenopathy. -- Normal thyroid gland where visualized. -  Unremarkable esophagus. Lungs/Pleura: Again noted is a masslike area of consolidation involving the left upper lobe. Advanced fibrotic changes are again noted of the left lung field with significant left-sided volume loss as before. There is a small amount of debris within the trachea. No right-sided pneumothorax. There are trace bilateral pleural effusions. Upper Abdomen: Contrast bolus timing is not optimized for evaluation of the abdominal organs. Again noted are findings suggestive of cirrhosis. There is cholelithiasis. There are probable nonobstructing stones in the upper pole the right kidney. Musculoskeletal: No chest wall abnormality. No bony spinal canal stenosis. Review of the MIP images confirms the above findings. IMPRESSION: 1. No  evidence for acute pulmonary embolus. 2. No significant oval change in left-sided lung findings including a left upper lobe lung mass. 3. Probable cirrhosis and cholelithiasis involving the partially visualized upper abdomen. Aortic Atherosclerosis (ICD10-I70.0). Electronically Signed   By: Constance Holster M.D.   On: 07/18/2020 19:58   DG Chest Port 1 View  Result Date: 07/19/2020 CLINICAL DATA:  Difficulty breathing.  Concern for aspiration. EXAM: PORTABLE CHEST 1 VIEW COMPARISON:  Chest radiograph and chest CT July 18, 2020 FINDINGS: There is extensive fibrosis throughout the left lung. Consolidation left apex region with questionable mass in this area, unchanged. There is mild right base atelectasis. Right lung otherwise clear. Postoperative change consistent with prior right lung transplant on the right. Heart size is within normal limits. Distortion of pulmonary vascularity on the left due to fibrosis is stable. Stents on the left are stable. There is aortic atherosclerosis. No bone lesions. No adenopathy appreciable by radiography. No bone lesions. IMPRESSION: Extensive fibrosis on the left with persistent opacity in the left apex. CT shows evidence suggesting potential mass with associated consolidation in the left apex. Mild atelectasis right base. Right lung otherwise clear. Stable cardiac silhouette. Postoperative changes noted. Aortic Atherosclerosis (ICD10-I70.0). Electronically Signed   By: Lowella Grip III M.D.   On: 07/19/2020 08:53   ECHOCARDIOGRAM COMPLETE  Result Date: 07/19/2020    ECHOCARDIOGRAM REPORT   Patient Name:   [name] [facility] Date of Exam: 07/19/2020 Medical Rec #:  [id number]         Height:       63.0 in Accession #:    [id number]        Weight:       148.6 lb Date of Birth:  05-01-1951        BSA:          1.704 m Patient Age:    63 years          BP:           135/74 mmHg Patient Gender: M                 HR:           78 bpm. Exam Location:  Inpatient  Procedure: 2D Echo Indications:    acute respiratory distress  History:        Patient has prior history of Echocardiogram examinations, most                 recent 03/04/2013. End stage renal disease; Risk Factors:Sleep                 Apnea, Hypertension and Diabetes.  Sonographer:    Johny Chess Referring Phys: [place]  1. Left ventricular ejection fraction, by estimation, is 70 to 75%. The left ventricle has hyperdynamic function. The left ventricle has no regional wall motion abnormalities. There is mild concentric left ventricular hypertrophy. Left ventricular diastolic parameters are consistent with Grade I diastolic dysfunction (impaired relaxation).  2. RV incompletely visualized. Based on limited views, the right ventricular systolic function is normal. The right ventricular size is normal. There is normal pulmonary artery systolic pressure.  3. The mitral valve is abnormal. Trivial mitral valve regurgitation. Moderate mitral annular calcification.  4. The aortic valve is tricuspid. There is moderate calcification of the aortic valve. There is moderate thickening of the aortic valve. Aortic valve regurgitation is mild. Mild aortic valve stenosis.  5. The inferior vena cava is normal in size with greater than 50% respiratory variability, suggesting right atrial pressure of 3 mmHg. Comparison(s): Compared to prior echo report in 2014, there is now mild aortic stenosis. FINDINGS  Left Ventricle: Left ventricular ejection fraction, by estimation, is 70 to 75%. The left ventricle has hyperdynamic function. The left ventricle has no regional wall motion abnormalities. The left ventricular internal cavity size was normal in size. There is mild concentric left ventricular hypertrophy. Left ventricular diastolic parameters are consistent with Grade I diastolic dysfunction (impaired relaxation). Right Ventricle: RV incompletely visualized. Based on limited views, the right ventricular  size is normal. Right vetricular wall thickness was not well visualized. Right ventricular systolic function is normal. There is normal pulmonary artery systolic pressure. The tricuspid regurgitant velocity is 2.61 m/s, and with an assumed right atrial pressure of 3 mmHg, the estimated right ventricular systolic pressure is 40.3 mmHg. Left Atrium: Left atrial size was normal in size. Right Atrium: Right atrial size was normal in size. Pericardium: There is no evidence of pericardial effusion. Mitral Valve: The mitral valve is abnormal. There is mild thickening of the mitral valve leaflet(s). There is mild calcification of the mitral valve leaflet(s). Moderate mitral annular calcification. Trivial mitral valve regurgitation. Tricuspid Valve: The tricuspid valve is normal in structure. Tricuspid valve regurgitation is mild. Aortic Valve: The aortic valve is  tricuspid. There is moderate calcification of the aortic valve. There is moderate thickening of the aortic valve. Aortic valve regurgitation is mild. Mild aortic stenosis is present. Aortic valve mean gradient measures 9.0 mmHg. Aortic valve peak gradient measures 15.7 mmHg. Aortic valve area, by VTI measures 1.59 cm. Pulmonic Valve: The pulmonic valve was normal in structure. Pulmonic valve regurgitation is not visualized. Aorta: The aortic root and ascending aorta are structurally normal, with no evidence of dilitation. Venous: The inferior vena cava is normal in size with greater than 50% respiratory variability, suggesting right atrial pressure of 3 mmHg. IAS/Shunts: No atrial level shunt detected by color flow Doppler.  LEFT VENTRICLE PLAX 2D LVIDd:         3.60 cm  Diastology LVIDs:         2.00 cm  LV e' medial:    5.44 cm/s LV PW:         0.90 cm  LV E/e' medial:  17.5 LV IVS:        1.20 cm  LV e' lateral:   8.59 cm/s LVOT diam:     1.90 cm  LV E/e' lateral: 11.1 LV SV:         56 LV SV Index:   33 LVOT Area:     2.84 cm  RIGHT VENTRICLE             IVC  RV S prime:     12.30 cm/s  IVC diam: 1.30 cm TAPSE (M-mode): 1.4 cm LEFT ATRIUM           Index       RIGHT ATRIUM           Index LA diam:      3.70 cm 2.17 cm/m  RA Area:     12.40 cm LA Vol (A2C): 34.4 ml 20.18 ml/m RA Volume:   28.90 ml  16.96 ml/m  AORTIC VALVE AV Area (Vmax):    1.52 cm AV Area (Vmean):   1.44 cm AV Area (VTI):     1.59 cm AV Vmax:           198.00 cm/s AV Vmean:          137.000 cm/s AV VTI:            0.355 m AV Peak Grad:      15.7 mmHg AV Mean Grad:      9.0 mmHg LVOT Vmax:         106.25 cm/s LVOT Vmean:        69.400 cm/s LVOT VTI:          0.199 m LVOT/AV VTI ratio: 0.56  AORTA Ao Root diam: 2.80 cm Ao Asc diam:  2.90 cm MITRAL VALVE                TRICUSPID VALVE MV Area (PHT): 2.66 cm     TR Peak grad:   27.2 mmHg MV Decel Time: 285 msec     TR Vmax:        261.00 cm/s MV E velocity: 95.10 cm/s MV A velocity: 108.00 cm/s  SHUNTS MV E/A ratio:  0.88         Systemic VTI:  0.20 m                             Systemic Diam: 1.90 cm Gwyndolyn Kaufman MD Electronically signed by Gwyndolyn Kaufman MD Signature Date/Time: 07/19/2020/11:29:29 AM  Final     Scheduled Meds: . Alpha Lipoic Acid  200-400 mg Oral See admin instructions  . aspirin EC  81 mg Oral Q breakfast  . azaTHIOprine  50 mg Oral Q M,W,F-1800  . calcium acetate  667 mg Oral TID WC  . Chlorhexidine Gluconate Cloth  6 each Topical Daily  . Chlorhexidine Gluconate Cloth  6 each Topical Q0600  . cycloSPORINE modified  100 mg Oral QHS  . cycloSPORINE modified  75 mg Oral Daily  . dorzolamide-timolol  1 drop Both Eyes BID  . doxercalciferol  3 mcg Intravenous Q M,W,F-HD  . ferrous sulfate  325 mg Oral Q M,W,F   And  . ferrous sulfate  325 mg Oral Q T,Th,S,Su  . guaiFENesin  1,200 mg Oral BID  . heparin  5,000 Units Subcutaneous Q8H  . insulin aspart  0-6 Units Subcutaneous TID WC  . midodrine  10 mg Oral Q M,W,F  . pantoprazole  40 mg Oral Daily  . pravastatin  10 mg Oral QHS  . sodium zirconium  cyclosilicate  10 g Oral Daily  . sulfamethoxazole-trimethoprim  1 tablet Oral Q M,W,F-1800  . valGANciclovir  450 mg Oral Once per day on Mon Fri  . vitamin B-6  25 mg Oral Q breakfast   Continuous Infusions: . piperacillin-tazobactam (ZOSYN)  IV 2.25 g (07/20/20 1408)     LOS: 2 days   Marylu Lund, MD [facility] Pager On Amion  If 7PM-7AM, please contact night-coverage 07/20/2020, 5:02 PM

## 2020-07-20 NOTE — Progress Notes (Signed)
[facility] Progress Note   Subjective:   Patient seen and examined at bedside. Waiting for transfer to [facility].  Denies CP, SOB, abdominal pain, and n/v/d.    Objective Vitals:   07/19/20 1657 07/19/20 2258 07/20/20 0022 07/20/20 0450  BP: 122/70  126/72 123/67  Pulse: 71 68 93 86  Resp: 14 16 18 18   Temp: 97.7 F (36.5 C)  97.8 F (36.6 C) 98.5 F (36.9 C)  TempSrc: Oral  Axillary Oral  SpO2: 98% 97% 94% 97%  Weight:      Height:       Physical Exam General:chronically ill appearing male in NAD Heart:RRR, no mrg Lungs:mostly CTAB, breath sounds decreased, nml WOB Abdomen:soft, NTND, +BS Extremities:no LE edema Dialysis Access: LU AVF +b   Filed Weights   07/18/20 1624  Weight: 67.4 kg    Intake/Output Summary (Last 24 hours) at 07/20/2020 1120 Last data filed at 07/20/2020 0400 Gross per 24 hour  Intake 1071.39 ml  Output -  Net 1071.39 ml    Additional Objective Labs: Basic Metabolic Panel: Recent Labs  Lab 07/18/20 0553 07/19/20 0626 07/20/20 0436  NA 140 137 136  K 4.2 4.7 5.2*  CL 99 95* 94*  CO2 29 28 24   GLUCOSE 72 89 82  BUN 20 32* 48*  CREATININE 5.16* 7.21* 9.54*  CALCIUM 7.9* 8.2* 8.1*  PHOS  --  3.9  --    Liver Function Tests: Recent Labs  Lab 07/18/20 0553 07/20/20 0436  AST 30 26  ALT 17 13  ALKPHOS 327* 285*  BILITOT 1.1 2.3*  PROT 5.7* 5.9*  ALBUMIN 2.3* 2.0*   CBC: Recent Labs  Lab 07/15/20 1354 07/17/20 1320 07/18/20 0553  WBC 3.4* 3.8* 3.2*  HGB 12.5* 12.3* 10.4*  HCT 40.9 38.2* 31.4*  MCV 112.4* 107.9* 106.4*  PLT 168 223 201   Blood Culture    Component Value Date/Time   SDES BLOOD RIGHT ANTECUBITAL 07/17/2020 1807   SPECREQUEST  07/17/2020 1807    BOTTLES DRAWN AEROBIC AND ANAEROBIC Blood Culture adequate volume   CULT  07/17/2020 1807    NO GROWTH 2 DAYS Performed at [facility], [place] [address], [place], [place] [address]    REPTSTATUS PENDING 07/17/2020 1807   CBG: Recent Labs   Lab 07/19/20 1112 07/19/20 1615 07/19/20 2131 07/20/20 0643 07/20/20 1100  GLUCAP 85 116* 76 82 65*   Studies/Results: CT ANGIO CHEST PE W OR WO CONTRAST  Result Date: 07/18/2020 CLINICAL DATA:  PE suspected. EXAM: CT ANGIOGRAPHY CHEST WITH CONTRAST TECHNIQUE: Multidetector CT imaging of the chest was performed using the standard protocol during bolus administration of intravenous contrast. Multiplanar CT image reconstructions and MIPs were obtained to evaluate the vascular anatomy. CONTRAST:  13mL OMNIPAQUE IOHEXOL 350 MG/ML SOLN COMPARISON:  07/15/2020 FINDINGS: Cardiovascular: Contrast injection is sufficient to demonstrate satisfactory opacification of the pulmonary arteries to the segmental level. There is no pulmonary embolus or evidence of right heart strain. There are atherosclerotic changes throughout the thoracic aorta without evidence for an aneurysm or dissection. The patient is status post prior CABG. There are coronary artery calcifications. Mediastinum/Nodes: -- No mediastinal lymphadenopathy. -- No hilar lymphadenopathy. -- No axillary lymphadenopathy. -- No supraclavicular lymphadenopathy. -- Normal thyroid gland where visualized. -  Unremarkable esophagus. Lungs/Pleura: Again noted is a masslike area of consolidation involving the left upper lobe. Advanced fibrotic changes are again noted of the left lung field with significant left-sided volume loss as before. There is a  small amount of debris within the trachea. No right-sided pneumothorax. There are trace bilateral pleural effusions. Upper Abdomen: Contrast bolus timing is not optimized for evaluation of the abdominal organs. Again noted are findings suggestive of cirrhosis. There is cholelithiasis. There are probable nonobstructing stones in the upper pole the right kidney. Musculoskeletal: No chest wall abnormality. No bony spinal canal stenosis. Review of the MIP images confirms the above findings. IMPRESSION: 1. No evidence for  acute pulmonary embolus. 2. No significant oval change in left-sided lung findings including a left upper lobe lung mass. 3. Probable cirrhosis and cholelithiasis involving the partially visualized upper abdomen. Aortic Atherosclerosis (ICD10-I70.0). Electronically Signed   By: Constance Holster M.D.   On: 07/18/2020 19:58   DG Chest Port 1 View  Result Date: 07/19/2020 CLINICAL DATA:  Difficulty breathing.  Concern for aspiration. EXAM: PORTABLE CHEST 1 VIEW COMPARISON:  Chest radiograph and chest CT July 18, 2020 FINDINGS: There is extensive fibrosis throughout the left lung. Consolidation left apex region with questionable mass in this area, unchanged. There is mild right base atelectasis. Right lung otherwise clear. Postoperative change consistent with prior right lung transplant on the right. Heart size is within normal limits. Distortion of pulmonary vascularity on the left due to fibrosis is stable. Stents on the left are stable. There is aortic atherosclerosis. No bone lesions. No adenopathy appreciable by radiography. No bone lesions. IMPRESSION: Extensive fibrosis on the left with persistent opacity in the left apex. CT shows evidence suggesting potential mass with associated consolidation in the left apex. Mild atelectasis right base. Right lung otherwise clear. Stable cardiac silhouette. Postoperative changes noted. Aortic Atherosclerosis (ICD10-I70.0). Electronically Signed   By: Lowella Grip III M.D.   On: 07/19/2020 08:53   ECHOCARDIOGRAM COMPLETE  Result Date: 07/19/2020    ECHOCARDIOGRAM REPORT   Patient Name:   [name] [facility] Date of Exam: 07/19/2020 Medical Rec #:  [id number]         Height:       63.0 in Accession #:    [id number]        Weight:       148.6 lb Date of Birth:  [birth year]        BSA:          1.704 m Patient Age:    70 years          BP:           135/74 mmHg Patient Gender: M                 HR:           78 bpm. Exam Location:  Inpatient Procedure: 2D Echo  Indications:    acute respiratory distress  History:        Patient has prior history of Echocardiogram examinations, most                 recent 03/04/2013. End stage renal disease; Risk Factors:Sleep                 Apnea, Hypertension and Diabetes.  Sonographer:    Johny Chess Referring Phys: [place]  1. Left ventricular ejection fraction, by estimation, is 70 to 75%. The left ventricle has hyperdynamic function. The left ventricle has no regional wall motion abnormalities. There is mild concentric left ventricular hypertrophy. Left ventricular diastolic parameters are consistent with Grade I diastolic dysfunction (impaired relaxation).  2. RV incompletely visualized. Based on limited views, the right ventricular  systolic function is normal. The right ventricular size is normal. There is normal pulmonary artery systolic pressure.  3. The mitral valve is abnormal. Trivial mitral valve regurgitation. Moderate mitral annular calcification.  4. The aortic valve is tricuspid. There is moderate calcification of the aortic valve. There is moderate thickening of the aortic valve. Aortic valve regurgitation is mild. Mild aortic valve stenosis.  5. The inferior vena cava is normal in size with greater than 50% respiratory variability, suggesting right atrial pressure of 3 mmHg. Comparison(s): Compared to prior echo report in 2014, there is now mild aortic stenosis. FINDINGS  Left Ventricle: Left ventricular ejection fraction, by estimation, is 70 to 75%. The left ventricle has hyperdynamic function. The left ventricle has no regional wall motion abnormalities. The left ventricular internal cavity size was normal in size. There is mild concentric left ventricular hypertrophy. Left ventricular diastolic parameters are consistent with Grade I diastolic dysfunction (impaired relaxation). Right Ventricle: RV incompletely visualized. Based on limited views, the right ventricular size is normal.  Right vetricular wall thickness was not well visualized. Right ventricular systolic function is normal. There is normal pulmonary artery systolic pressure. The tricuspid regurgitant velocity is 2.61 m/s, and with an assumed right atrial pressure of 3 mmHg, the estimated right ventricular systolic pressure is 63.8 mmHg. Left Atrium: Left atrial size was normal in size. Right Atrium: Right atrial size was normal in size. Pericardium: There is no evidence of pericardial effusion. Mitral Valve: The mitral valve is abnormal. There is mild thickening of the mitral valve leaflet(s). There is mild calcification of the mitral valve leaflet(s). Moderate mitral annular calcification. Trivial mitral valve regurgitation. Tricuspid Valve: The tricuspid valve is normal in structure. Tricuspid valve regurgitation is mild. Aortic Valve: The aortic valve is tricuspid. There is moderate calcification of the aortic valve. There is moderate thickening of the aortic valve. Aortic valve regurgitation is mild. Mild aortic stenosis is present. Aortic valve mean gradient measures 9.0 mmHg. Aortic valve peak gradient measures 15.7 mmHg. Aortic valve area, by VTI measures 1.59 cm. Pulmonic Valve: The pulmonic valve was normal in structure. Pulmonic valve regurgitation is not visualized. Aorta: The aortic root and ascending aorta are structurally normal, with no evidence of dilitation. Venous: The inferior vena cava is normal in size with greater than 50% respiratory variability, suggesting right atrial pressure of 3 mmHg. IAS/Shunts: No atrial level shunt detected by color flow Doppler.  LEFT VENTRICLE PLAX 2D LVIDd:         3.60 cm  Diastology LVIDs:         2.00 cm  LV e' medial:    5.44 cm/s LV PW:         0.90 cm  LV E/e' medial:  17.5 LV IVS:        1.20 cm  LV e' lateral:   8.59 cm/s LVOT diam:     1.90 cm  LV E/e' lateral: 11.1 LV SV:         56 LV SV Index:   33 LVOT Area:     2.84 cm  RIGHT VENTRICLE             IVC RV S prime:      12.30 cm/s  IVC diam: 1.30 cm TAPSE (M-mode): 1.4 cm LEFT ATRIUM           Index       RIGHT ATRIUM           Index LA diam:  3.70 cm 2.17 cm/m  RA Area:     12.40 cm LA Vol (A2C): 34.4 ml 20.18 ml/m RA Volume:   28.90 ml  16.96 ml/m  AORTIC VALVE AV Area (Vmax):    1.52 cm AV Area (Vmean):   1.44 cm AV Area (VTI):     1.59 cm AV Vmax:           198.00 cm/s AV Vmean:          137.000 cm/s AV VTI:            0.355 m AV Peak Grad:      15.7 mmHg AV Mean Grad:      9.0 mmHg LVOT Vmax:         106.25 cm/s LVOT Vmean:        69.400 cm/s LVOT VTI:          0.199 m LVOT/AV VTI ratio: 0.56  AORTA Ao Root diam: 2.80 cm Ao Asc diam:  2.90 cm MITRAL VALVE                TRICUSPID VALVE MV Area (PHT): 2.66 cm     TR Peak grad:   27.2 mmHg MV Decel Time: 285 msec     TR Vmax:        261.00 cm/s MV E velocity: 95.10 cm/s MV A velocity: 108.00 cm/s  SHUNTS MV E/A ratio:  0.88         Systemic VTI:  0.20 m                             Systemic Diam: 1.90 cm Gwyndolyn Kaufman MD Electronically signed by Gwyndolyn Kaufman MD Signature Date/Time: 07/19/2020/11:29:29 AM    Final     Medications: . piperacillin-tazobactam (ZOSYN)  IV 2.25 g (07/20/20 0618)   . Alpha Lipoic Acid  200-400 mg Oral See admin instructions  . aspirin EC  81 mg Oral Q breakfast  . azaTHIOprine  50 mg Oral Q M,W,F-1800  . calcium acetate  667 mg Oral TID WC  . Chlorhexidine Gluconate Cloth  6 each Topical Daily  . Chlorhexidine Gluconate Cloth  6 each Topical Q0600  . cycloSPORINE modified  100 mg Oral QHS  . cycloSPORINE modified  75 mg Oral Daily  . dorzolamide-timolol  1 drop Both Eyes BID  . doxercalciferol  3 mcg Intravenous Q M,W,F-HD  . ferrous sulfate  325 mg Oral Q M,W,F   And  . ferrous sulfate  325 mg Oral Q T,Th,S,Su  . guaiFENesin  1,200 mg Oral BID  . heparin  5,000 Units Subcutaneous Q8H  . insulin aspart  0-6 Units Subcutaneous TID WC  . midodrine  10 mg Oral Q M,W,F  . pantoprazole  40 mg Oral Daily  .  pravastatin  10 mg Oral QHS  . sulfamethoxazole-trimethoprim  1 tablet Oral Q M,W,F-1800  . valGANciclovir  450 mg Oral Once per day on Mon Fri  . vitamin B-6  25 mg Oral Q breakfast    Dialysis Orders: Center: [facility]  on MWF . 160NRe, 4 hours, BFR 400, DFR 500 (due to dialyzer shortage), EDW 64.5kg, 3K/2Ca, UF Profile 2, AVF 15g, heparin 1800 unit bolus Hectorol 3 mcg IV q HD  Assessment/Plan: 1.  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 Worsening dyspnea x 2 weeks. On zosyn per pulmonology, planning to transfer to [facility] when bed is  available 2.  ESRD:  Dialyzes on MWF schedule. Plan for HD today per regular schedule. K 5.2.  3.  Hypertension/volume: BP well controlled this AM. No volume overload on exam or CXR. Per bed weights over edw,  UF as tolerated. Generally tolerates about 2L UF with HD.  4.  Anemia: Hgb at goal. Last 10.4.  No ESA indicated at this time, restart cautiously due to concern for possible malignancy.  5.  Metabolic bone disease: Calcium and phosphorus at goal. Continue VDRA and calcium acetate.  6. T2DM: Insulin per primary team 7. Nutrition - renal diet with fluid restrictions. Protein supplements.    Jen Mow, PA-C [facility] 07/20/2020,11:20 AM  LOS: 2 days

## 2020-07-20 NOTE — Telephone Encounter (Signed)
Please schedule to see Dr. Chase Caller in the office 4 to 6 weeks

## 2020-07-20 NOTE — Progress Notes (Signed)
NAME:  [name], MRN:  [id number], DOB:  09-15-1950, LOS: 2 ADMISSION DATE:  07/17/2020, CONSULTATION DATE:  07/17/2021 REFERRING MD: Langston Masker , CHIEF COMPLAINT:  New Lung mass, worsening dyspnea in setting of ILD, Lung Transplant 2016   Brief History:  70 year old male significant history of pulmonary fibrosis, diabetes, hypertension, end-stage renal disease currently on dialysis ( No missed treatments), lung transplant  (2016 at [facility]) on immunosuppresive medications, ESRD on MWF dialysis  Presenting 2/11  for evaluation of worsening dyspnea in setting of ILD, new lung mass. PCCM have been asked to see as a pulmonary consult.  Of note, patient was seen in [facility] ED 2/9 for dyspnea and chest pain in the setting of a mechanical fall.   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 The change in his breathing was noted 2-3 weeks ago. He does not endorse any fever,hemoptysis or new lower extremity edema. Marland Kitchen  PCCM have been asked to see as a pulmonary consult.  Of note, patient was seen in [facility] ED 2/9 for dyspnea and chest pain in the setting of a mechanical fall. He was discharged home.  In the ED patient is on RA and sats are 100% at rest. He states his dyspnea is with minimal exertion, less at rest.. He states he notices his respiratory  rate increases and he gets dizzy and has to sit down. He states he does have oxygen sat monitor at home, but his hands stay so cold he does not get a reading. He does not have home oxygen.  Pt. States he is compliant with his suppressive medications, and he does not miss HD. States he feels his dyspnea is worse after HD treatments.  CT Scan is  significant for a New masslike consolidation in the posterior apical left upper lobe measuring 3.9 x 3.6 cm. This is a new , was not seen on imaging 6 months ago. Afebrile, WBC of 3.4, HGB 12.3, platelets 223 Creatinine 3.54  Past Medical History:   Past Medical History:  Diagnosis Date  . Aortic valve disorders   . Benign neoplasm of colon   . Degeneration of intervertebral disc, site unspecified   . Diabetes mellitus without complication ([place])   . 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 Esophageal reflux   . ESRD (end stage renal disease) on dialysis ([place])   . Essential hypertension   . Obstructive sleep apnea (adult) (pediatric)   . Osteoarthrosis, unspecified whether generalized or localized, unspecified site   . Other and unspecified hyperlipidemia   . Pneumonia    interstitial pneumonia  . Pulmonary fibrosis ([place])   . Renal disorder   . Respiratory failure with hypoxia ([place]) 12/2015  . Transplanted, lung ([place])   . 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 PET-CT suggested for further evaluation. Subtle erosive change along the anterior surface of the posterior left third rib, cannot exclude direct tumor involvement. 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  Micro Data:  MRSA PCR 2/12 > negative  Blood culture 2/12 >  RVP 2/13 > negative   Antimicrobials:    Interim History / Subjective:  Denies any acute complaints  Answered yes to use of CPAP overnight   Objective   Blood pressure 123/67, pulse 86, temperature 98.5 F (36.9 C), temperature source Oral, resp. rate 18, height 5\' 3"  (1.6 m),  weight 67.4 kg, SpO2 97 %.        Intake/Output Summary (Last 24 hours) at 07/20/2020 0801 Last data filed at 07/20/2020 0400 Gross per 24 hour  Intake 1071.39 ml  Output --  Net 1071.39 ml   Filed Weights   07/18/20 1624  Weight: 67.4 kg    Examination: General: Thin elderly male seen lying in bed with minimal interaction. He keep eyes closed and responded in word word replies only  HEENT: [place]/AT, MM pink/moist  Neuro: Appears alert and oriented but exam limited CV: s1s2 regular rate and rhythm, no murmur, rubs, or gallops,  PULM:  Clear to ascultation, no added breath sounds, no increased work of breathing  GI: soft, bowel sounds active in all 4 quadrants, non-tender, non-distended Extremities: warm/dry, no edema  Skin: no rashes or lesions  Resolved Hospital Problem list     Assessment & Plan:  New Masslike consolidation in the posterior apical left upper lobe ( native lung) in immunocompromised patient.  -DDx includes Atypical Pneumonia vs abscess vs malignancy  Right Lung Transplant 2016 on immunosuppressive therapy Worsening dyspnea x 2 weeks P: Appears patient has been accepted to [facility], transfer pending Biopsy of lung mass upon transfer to [place] Follow cultures Continue BDs Quantiferon gold reported as indeterminate   Supportive care   Dyspnea/ Hypoxemia with minimal exertion 2/2 above ILD per L native lung -Not on supplemental oxygen at baseline P: Remains on RA during the day  CPAP use at night  Encourage pulmonary hygiene   ESRD P: Follow renal function / urine output Trend Bmet Avoid nephrotoxins Ensure adequate renal perfusion   Best practice (evaluated daily)  Diet: Per Primary Pain/Anxiety/Delirium protocol (if indicated): Per Primary VAP protocol (if indicated): NA DVT prophylaxis: Per Primary team GI prophylaxis: Per Primary Team Glucose control: Per Primary team Mobility: OOB to chair  Disposition:Per Primary  team  Team communication  Lung Transplant Team at [facility]  PT transplant Coordinator at [facility] >> [place] >> [phone] Fax number they have requested noted be sent to>> [phone]  Goals of Care:  Last date of multidisciplinary goals of care discussion: Family and staff present:  Summary of discussion:  Follow up goals of care discussion due:  Code Status: Full  Labs   CBC: Recent Labs  Lab 07/15/20 1354 07/17/20 1320 07/18/20 0553  WBC 3.4* 3.8* 3.2*  HGB 12.5* 12.3* 10.4*  HCT 40.9 38.2* 31.4*  MCV 112.4* 107.9* 106.4*  PLT 168 223 428    Basic Metabolic Panel: Recent Labs  Lab 07/15/20 1354 07/17/20 1320 07/18/20 0553 07/19/20 0626 07/20/20 0436  NA 137 139 140 137 136  K 4.4 4.0 4.2 4.7 5.2*  CL 95* 96* 99 95* 94*  CO2 23 26 29 28 24   GLUCOSE 144* 128* 72 89 82  BUN 8 11 20  32* 48*  CREATININE 3.89* 3.54* 5.16* 7.21* 9.54*  CALCIUM 8.9 8.6* 7.9* 8.2* 8.1*  MG  --   --  2.0 2.0  --   PHOS  --   --   --  3.9  --    GFR: Estimated Creatinine Clearance: 5.7 mL/min (A) (by C-G formula based on SCr of 9.54 mg/dL (H)). Recent Labs  Lab 07/15/20 1354 07/17/20 1320 07/17/20 1704 07/18/20 0553 07/19/20 0626  PROCALCITON  --   --  1.13 1.13 1.08  WBC 3.4* 3.8*  --  3.2*  --     Liver Function Tests: Recent Labs  Lab 07/18/20 0553 07/20/20 0436  AST 30 26  ALT 17 13  ALKPHOS 327* 285*  BILITOT 1.1 2.3*  PROT 5.7* 5.9*  ALBUMIN 2.3* 2.0*   No results for input(s): LIPASE, AMYLASE in the last 168 hours. No results for input(s): AMMONIA in the last 168 hours.  ABG    Component Value Date/Time   PHART 7.381 01/01/2016 1843   PCO2ART 42.4 01/01/2016 1843   PO2ART 78.0 (L) 01/01/2016 1843   HCO3 26.8 03/28/2018 1010   TCO2 26 02/05/2019 1552   ACIDBASEDEF 2.0 12/30/2015 0841   O2SAT 53.0 03/28/2018 1010     Coagulation Profile: No results for input(s): INR, PROTIME in the last 168 hours.  Cardiac Enzymes: No results for input(s): CKTOTAL,  CKMB, CKMBINDEX, TROPONINI in the last 168 hours.  HbA1C: Hgb A1c MFr Bld  Date/Time Value Ref Range Status  07/18/2020 05:53 AM 5.4 4.8 - 5.6 % Final    Comment:    (NOTE) Pre diabetes:          5.7%-6.4%  Diabetes:              >6.4%  Glycemic control for   <7.0% adults with diabetes   01/08/2020 10:59 AM 5.8 (H) 4.8 - 5.6 % Final    Comment:    (NOTE) Pre diabetes:          5.7%-6.4%  Diabetes:              >6.4%  Glycemic control for   <7.0% adults with diabetes     CBG: Recent Labs  Lab 07/19/20 0612 07/19/20 1112 07/19/20 1615 07/19/20 2131 07/20/20 0643  GLUCAP 81 85 116* 76 82       Signature:   [name], NP-C  Pulmonary & Critical Care Personal contact information can be found on Amion  If no response please page: Adult pulmonary and critical care medicine pager on Amion unitl 7pm After 7pm please call [phone] 07/20/2020, 8:07 AM

## 2020-07-20 NOTE — Progress Notes (Signed)
Discussed with Dr. Annamaria Boots- transplant service  Patient is hemodynamically stable Breathing is better   Will need further work-up for the lung mass -Neoplastic process versus fungal infection in the upper lobe -Was not present on previous CT 6 months ago  May be discharged on Augmentin To follow-up at [facility] as outpatient as early as this week  To let the transplant service know if plans change or patient not doing well  Sherrilyn Rist, MD [place] P[facility]M Pager: [phone]

## 2020-07-20 NOTE — Evaluation (Signed)
Physical Therapy Evaluation Patient Details Name: [name] MRN: [id number] DOB: 1950-09-06 Today's Date: 07/20/2020   History of Present Illness  The pt is a 70 yo male presenting with worsening chest pain and SOB. 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 PMH includes: DM II, ESRD on HD, HTN, OAS, HLD, and lung transplant.    Clinical Impression  Pt up in recliner upon arrival of PT, agreeable to evaluation at this time. Prior to admission the pt was independent with ADLs and mobility with use of straight cane, living at home with his wife in a home with 3 steps to enter. The pt now presents with limitations in functional mobility, endurance, and stability due to above dx, and will continue to benefit from skilled PT to address these deficits. The pt was able to complete 200 ft of ambulation with use of cane on RA, however, after ~170 ft the pt's SpO2 decreased to low of 80%. The pt recovered well with seated rest and donning of 2L O2. The pt was then able to walk 200 ft back to his room with 2L O2 and SpO2 in mid 90s. The pt will continue to benefit from skilled PT to progress functional endurance and activity tolerance prior to eventual return home.   SATURATION QUALIFICATIONS: (This note is used to comply with regulatory documentation for home oxygen)  Patient Saturations on Room Air at Rest = 97%  Patient Saturations on Room Air while Ambulating = 80% (after 215ft)  Patient Saturations on 2 Liters of oxygen while Ambulating = 94%  Please briefly explain why patient needs home oxygen: unable to maintain SpO2 > 90% on RA     Follow Up Recommendations Outpatient PT;Supervision for mobility/OOB    Equipment Recommendations   (O2)    Recommendations for Other Services       Precautions / Restrictions Precautions Precautions: Fall Precaution Comments: watch O2 Restrictions Weight Bearing Restrictions: No      Mobility   Bed Mobility Overal bed mobility: Independent             General bed mobility comments: pt OOB in recliner upon arrival of PT    Transfers Overall transfer level: Modified independent Equipment used: Straight cane             General transfer comment: pt able to stand from multiple surfaces with supervision only and use of [facility]  Ambulation/Gait Ambulation/Gait assistance: Supervision Gait Distance (Feet): 200 Feet (x2) Assistive device: Straight cane Gait Pattern/deviations: Step-through pattern;Wide base of support Gait velocity: decreased Gait velocity interpretation: <1.8 ft/sec, indicate of risk for recurrent falls General Gait Details: slow gait with wide BOS, able to sustain ~170 ft on RA with SpO2 in 90s, then dropped to low  of 80% with good pleth in last 30 ft. pt recovered with seated rest and 2L O2. Able to walk 200 ft back with 2L O2 and SpO2 low of 94%      Balance Overall balance assessment: Mild deficits observed, not formally tested                                           Pertinent Vitals/Pain Pain Assessment: No/denies pain    Home Living Family/patient expects to be discharged to:: Private residence Living Arrangements: Spouse/significant other Available Help at Discharge: Family;Available PRN/intermittently Type of Home: [place]  Access: Stairs to enter Entrance Stairs-Rails: Right Entrance Stairs-Number of Steps: 3 Home Layout: One level Home Equipment: Walker - 4 wheels;Cane - single point Additional Comments: pt states wife reccently had surgery, has a sister who could be home    Prior Function Level of Independence: Independent               Hand Dominance   Dominant Hand: Right    Extremity/Trunk Assessment   Upper Extremity Assessment Upper Extremity Assessment: Overall WFL for tasks assessed    Lower Extremity Assessment Lower Extremity Assessment: Overall WFL for tasks assessed    Cervical /  Trunk Assessment Cervical / Trunk Assessment: Normal  Communication   Communication: No difficulties  Cognition Arousal/Alertness: Awake/alert Behavior During Therapy: Flat affect;WFL for tasks assessed/performed Overall Cognitive Status: Within Functional Limits for tasks assessed                                 General Comments: pt with functional cognition conversationally, able to follow commands, to state when in need of rest.      General Comments General comments (skin integrity, edema, etc.): able to sustain ~170 ft on RA with SpO2 in 90s, then dropped to low  of 80% with good pleth in last 30 ft. pt recovered with seated rest and 2L O2. Able to walk 200 ft back with 2L O2 and SpO2 low of 94%    Exercises     Assessment/Plan    PT Assessment Patient needs continued PT services  PT Problem List Decreased activity tolerance;Decreased balance;Decreased mobility;Cardiopulmonary status limiting activity       PT Treatment Interventions Gait training;Stair training;Functional mobility training;Therapeutic activities;Balance training;Therapeutic exercise;Patient/family education    PT Goals (Current goals can be found in the Care Plan section)  Acute Rehab PT Goals Patient Stated Goal: return home PT Goal Formulation: With patient Time For Goal Achievement: 08/03/20 Potential to Achieve Goals: Good    Frequency Min 3X/week    AM-PAC PT "6 Clicks" Mobility  Outcome Measure Help needed turning from your back to your side while in a flat bed without using bedrails?: None Help needed moving from lying on your back to sitting on the side of a flat bed without using bedrails?: None Help needed moving to and from a bed to a chair (including a wheelchair)?: None Help needed standing up from a chair using your arms (e.g., wheelchair or bedside chair)?: A Little Help needed to walk in hospital room?: A Little Help needed climbing 3-5 steps with a railing? : A  Little 6 Click Score: 21    End of Session Equipment Utilized During Treatment: Oxygen Activity Tolerance: Patient tolerated treatment well Patient left: in chair;with call bell/phone within reach Nurse Communication: Mobility status PT Visit Diagnosis: Other abnormalities of gait and mobility (R26.89)    Time: 1700-1733 PT Time Calculation (min) (ACUTE ONLY): 33 min   Charges:   PT Evaluation $PT Eval Low Complexity: 1 Low PT Treatments $Gait Training: 8-22 mins        Karma Ganja, PT, DPT   Acute Rehabilitation Department Pager #: [phone]  Otho Bellows 07/20/2020, 5:46 PM

## 2020-07-21 ENCOUNTER — Other Ambulatory Visit (HOSPITAL_COMMUNITY): Payer: Self-pay | Admitting: Internal Medicine

## 2020-07-21 DIAGNOSIS — Z992 Dependence on renal dialysis: Secondary | ICD-10-CM | POA: Diagnosis not present

## 2020-07-21 DIAGNOSIS — I10 Essential (primary) hypertension: Secondary | ICD-10-CM | POA: Diagnosis not present

## 2020-07-21 DIAGNOSIS — R918 Other nonspecific abnormal finding of lung field: Secondary | ICD-10-CM | POA: Diagnosis not present

## 2020-07-21 DIAGNOSIS — N186 End stage renal disease: Secondary | ICD-10-CM | POA: Diagnosis not present

## 2020-07-21 LAB — RENAL FUNCTION PANEL
Albumin: 2.1 g/dL — ABNORMAL LOW (ref 3.5–5.0)
Anion gap: 23 — ABNORMAL HIGH (ref 5–15)
BUN: 60 mg/dL — ABNORMAL HIGH (ref 8–23)
CO2: 20 mmol/L — ABNORMAL LOW (ref 22–32)
Calcium: 8.1 mg/dL — ABNORMAL LOW (ref 8.9–10.3)
Chloride: 91 mmol/L — ABNORMAL LOW (ref 98–111)
Creatinine, Ser: 11.52 mg/dL — ABNORMAL HIGH (ref 0.61–1.24)
GFR, Estimated: 4 mL/min — ABNORMAL LOW (ref >60)
Glucose, Bld: 146 mg/dL — ABNORMAL HIGH (ref 70–99)
Phosphorus: 5.9 mg/dL — ABNORMAL HIGH (ref 2.5–4.6)
Potassium: 4.9 mmol/L (ref 3.5–5.1)
Sodium: 134 mmol/L — ABNORMAL LOW (ref 135–145)

## 2020-07-21 LAB — CBC
HCT: 33.8 % — ABNORMAL LOW (ref 39.0–52.0)
Hemoglobin: 10.9 g/dL — ABNORMAL LOW (ref 13.0–17.0)
MCH: 33.9 pg (ref 26.0–34.0)
MCHC: 32.2 g/dL (ref 30.0–36.0)
MCV: 105 fL — ABNORMAL HIGH (ref 80.0–100.0)
Platelets: 236 10*3/uL (ref 150–400)
RBC: 3.22 MIL/uL — ABNORMAL LOW (ref 4.22–5.81)
RDW: 15.3 % (ref 11.5–15.5)
WBC: 6.4 10*3/uL (ref 4.0–10.5)
nRBC: 0 % (ref 0.0–0.2)

## 2020-07-21 LAB — GLUCOSE, CAPILLARY
Glucose-Capillary: 79 mg/dL (ref 70–99)
Glucose-Capillary: 131 mg/dL — ABNORMAL HIGH (ref 70–99)

## 2020-07-21 MED ORDER — AMOXICILLIN-POT CLAVULANATE 875-125 MG PO TABS: 1.0000 | ORAL_TABLET | Freq: Two times a day (BID) | ORAL | 0 refills | Status: DC

## 2020-07-21 MED ORDER — DOXERCALCIFEROL 4 MCG/2ML IV SOLN
INTRAVENOUS | Status: AC
  Administered 2020-07-21: 3 ug via INTRAVENOUS
  Filled 2020-07-21: qty 2

## 2020-07-21 MED FILL — AMOX-CLAV 875-125 MG TABLET: 875-125 | 7 days supply | Qty: 14 | Fill #0

## 2020-07-21 NOTE — Progress Notes (Signed)
Patient discharged home via wheelchair.

## 2020-07-21 NOTE — Discharge Summary (Signed)
Physician Discharge Summary  [name] XAJ:[id number] DOB: [birth year] DOA: 07/17/2020  PCP: Wenda Low, MD  Admit date: 07/17/2020 Discharge date: 07/21/2020  Admitted From: Home Disposition:  Home  Recommendations for Outpatient Follow-up:  1. Follow up with PCP in 1-2 weeks 2. Follow up with [facility] as scheduled:  Discharge Condition:Improved CODE STATUS:Full Diet recommendation: Diabetic, renal   Brief/Interim Summary: 70 y.o.malewithhistory of interstitial lung disease status post single lung transplant in 2016 at [facility] presents to the ER with complaints of increasing shortness of breath ongoing for the last 2 weeks. Patient also had a recent fall on ice about 2 weeks ago. Patient states he has minimal productive cough. Shortness of breath increased on exertion. Mild chest pain on the left upper chest wall around the inferior aspect of the clavicle.  ED Course:In the ER patient underwent CT chest which shows left upper lobe masslike consolidation for which pulmonary consult was requested  Discharge Diagnoses:  Principal Problem:   Lung mass Active Problems:   Obstructive sleep apnea   Diabetes mellitus with complication (HCC)   Essential hypertension   ILD (interstitial lung disease) (HCC)   End-stage renal disease on hemodialysis ([place])   Pneumonia   Exercise hypoxemia  1. Lung mass differentials including possible lung abscess versus pneumonia versus malignancy.  1. Pulmonary consulted and is following 2. Continued on empiric antibiotics for possible infectious process 3. MRSA nasal swab is neg thus vancomycin d/c'd 4. Per Pulmonary, initial plan was forl transfer to [facility] when bed available. 5. O2 was weaned to room air 6. PCCM discussed with [facility] today. As pt has improved, plan for d/c home with augmentin with close outpt f/u with [facility]  7. Discussed with PCCM.  From Pulmonary standpoint, pt is clear for d/c 2. Lung transplant for interstitial lung disease presently on Imuran and cyclosporine and also takes Valcyte and Bactrim.  1. Pt reported that he has been weaned off the prednisone. 3. ESRD on hemodialysis 1. Dialyzes on Monday Wednesday Friday. 2. Continue HD as tolerated 4. Chronic Anemia 1. likely from ESRD 2. Follow CBC trends 5. Diabetes mellitus type 2 1. Continue on SSI coverage as needed 2. Glucose trends are stable thus far 6. Sleep apnea on CPAP at bedtime.   Discharge Instructions   Allergies as of 07/21/2020      Reactions   Levofloxacin Other (See Comments)   LOSS OF CONSCIOUSNESS   Nsaids Other (See Comments)   Patient instructed not to take NSAID's after his lung transplant      Medication List    STOP taking these medications   diclofenac Sodium 1 % Gel Commonly known as: VOLTAREN   oxyCODONE-acetaminophen 5-325 MG tablet Commonly known as: Percocet     TAKE these medications   Accu-Chek FastClix Lancets Misc "As directed up to 4 times daily"   acetaminophen 500 MG tablet Commonly known as: TYLENOL Take 500-1,000 mg by mouth every 6 (six) hours as needed for moderate pain or headache.   albuterol 108 (90 Base) MCG/ACT inhaler Commonly known as: VENTOLIN HFA Inhale 1-2 puffs into the lungs daily as needed for wheezing or shortness of breath.   Alpha Lipoic Acid 200 MG Caps Take 200-400 mg by mouth See admin instructions. On Monday, Wednesday, Friday (dialysis days): take 2 capsules (400 mg) by mouth with breakfast and 1 capsule (200 mg) with supper; on Sunday, Tuesday, Thursday, Saturday (non-dialysis days): take 1 capsule (200 mg)  three times daily with meals   amoxicillin-clavulanate 875-125 MG tablet Commonly known as: Augmentin Take 1 tablet by mouth 2 (two) times daily for 7 days.   aspirin 81 MG tablet Take 81 mg by mouth daily with breakfast.   azaTHIOprine 50 MG tablet Commonly known  as: IMURAN Take 50 mg by mouth See admin instructions. Take one tablet (50 mg) by mouth on Monday, Wednesday, Friday after dialysis   calcium acetate 667 MG capsule Commonly known as: PHOSLO Take 667 mg by mouth 3 (three) times daily before meals.   Cosopt PF 22.3-6.8 MG/ML Soln ophthalmic solution Generic drug: dorzolamidel-timolol Place 1 drop into both eyes 2 (two) times daily.   cycloSPORINE modified 25 MG capsule Commonly known as: NEORAL Take 75-100 mg by mouth See admin instructions. Take 3 capsule (75 mg) by mouth daily with breakfast and 4 capsules (100 mg) at bedtime   fluorometholone 0.1 % ophthalmic suspension Commonly known as: FML Place 1 drop into the right eye 2 (two) times daily.   fluticasone 50 MCG/ACT nasal spray Commonly known as: FLONASE Place 2 sprays into the nose daily. What changed:   when to take this  reasons to take this   gabapentin 100 MG capsule Commonly known as: NEURONTIN Take 100 mg by mouth at bedtime.   Immune Globulin 10% 20 GM/200ML Soln Commonly known as: PRIVIGEN Inject 0.5 g/kg into the vein every 3 (three) months.   midodrine 5 MG tablet Commonly known as: PROAMATINE Take 5-10 mg by mouth See admin instructions. On Monday, Wednesday, Friday(dialysis days): take 1 tablet (5 mg) by mouth before dialysis, take 1 tablet (5 mg) after dialysis as needed for SBP >90. On Sunday, Tuesday, Thursday, Saturday (non-dialysis days): take 1 tablet (5 mg) by mouth 3 times daily as needed for SBP >90.   multivitamin with minerals Tabs tablet Take 1 tablet by mouth daily. Centrum for Men 50 plus: on Monday, Wednesday, Friday (dialysis days): take one tablet daily with breakfast; on Sunday, Tuesday, Thursday, Saturday: take one tablet daily with supper   NovoLIN R 100 units/mL injection Generic drug: insulin regular Inject 5-9 Units into the skin See admin instructions. Inject 5 units subcutaneously with breakfast and 6 units with lunch and supper;  PLUS adjustment per sliding scale: [phone] add 1 unit, 261-360 add 2 units, [phone] add 3 units   omeprazole 40 MG capsule Commonly known as: PRILOSEC Take 40 mg by mouth 2 (two) times daily with a meal.   polyvinyl alcohol-povidone 1.4-0.6 % ophthalmic solution Commonly known as: HYPOTEARS Place 1 drop into both eyes at bedtime.   pravastatin 10 MG tablet Commonly known as: PRAVACHOL Take 10 mg by mouth at bedtime.   sulfamethoxazole-trimethoprim 400-80 MG tablet Commonly known as: BACTRIM Take 1 tablet by mouth See admin instructions. On Monday, Wednesday, Friday (dialysis days): take one tablet by mouth daily with supper   tizanidine 2 MG capsule Commonly known as: Zanaflex Take 1 capsule (2 mg total) by mouth at bedtime as needed for muscle spasms.   valGANciclovir 450 MG tablet Commonly known as: VALCYTE Take 450 mg by mouth See admin instructions. Take one tablet (450 mg) by mouth on Monday, Wednesday, and Friday after dialysis   vitamin B-6 25 MG tablet Commonly known as: pyridOXINE Take 25 mg by mouth daily with breakfast.       Follow-up Information    Wenda Low, MD. Schedule an appointment as soon as possible for a visit in 2 week(s).  Specialty: Internal Medicine Contact information: [address], [address] [place] [address] [phone]        Please follow up with [facility] as scheduled. Schedule an appointment as soon as possible for a visit.              Allergies  Allergen Reactions  . Levofloxacin Other (See Comments)    LOSS OF CONSCIOUSNESS  . Nsaids Other (See Comments)    Patient instructed not to take NSAID's after his lung transplant    Consultations:  Pulmonary  Nephrology  Procedures/Studies: CT Chest Wo Contrast  Result Date: 07/15/2020 CLINICAL DATA:  Fall, left rib pain with left rib fracture suspected. History of lung transplant. EXAM: CT CHEST WITHOUT CONTRAST TECHNIQUE: Multidetector CT  imaging of the chest was performed following the standard protocol without IV contrast. COMPARISON:  Chest radiograph from earlier today. 01/08/2020 chest CT angiogram. FINDINGS: Cardiovascular: Normal heart size. No significant pericardial fluid/thickening. Three-vessel coronary atherosclerosis. Atherosclerotic nonaneurysmal thoracic aorta. Top-normal caliber main pulmonary artery (3.3 cm diameter). No evidence of acute thoracic aortic intramural hematoma. Left brachiocephalic vein stent is stable in position. Left cephalic vein stent is in place. Mediastinum/Nodes: No pneumomediastinum. No mediastinal hematoma. No discrete thyroid nodules. Unremarkable esophagus. No axillary, mediastinal or hilar lymphadenopathy. Lungs/Pleura: No pneumothorax. No pleural effusion. Moderate centrilobular and paraseptal emphysema in the left lung. Asymmetric prominent volume loss in the left lung, unchanged. Stable thin linear scar in the right lower lobe. New masslike consolidation in the posterior apical left upper lobe measuring 3.9 x 3.6 cm (series 5/image 23). Extensive patchy confluent reticulation and ground-glass opacity throughout the left lung with associated prominent traction bronchiectasis and architectural distortion, unchanged, compatible with band. Fibrotic interstitial lung disease. No additional significant pulmonary nodules. Upper Abdomen: Cholelithiasis. Diffusely mildly irregular liver surface suggesting cirrhosis. Nonobstructing upper right renal stones, largest 3 mm. Musculoskeletal: Subtle erosive change along the anterior surface of the posterior left third rib (series 5/image 20). No fracture detected in the chest. Mild thoracic spondylosis. IMPRESSION: 1. New masslike consolidation in the posterior apical left upper lobe measuring 3.9 x 3.6 cm, cannot exclude primary bronchogenic carcinoma. PET-CT suggested for further evaluation. 2. Subtle erosive change along the anterior surface of the posterior left  third rib, cannot exclude direct tumor involvement. No discrete bone fracture in the chest. 3. 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 No acute pulmonary disease in the right lung transplant. 4. Cholelithiasis. 5. Morphologic changes in the liver suggestive of cirrhosis. 6. Nonobstructing right nephrolithiasis. 7. Aortic Atherosclerosis (ICD10-I70.0) and Emphysema (ICD10-J43.9). Electronically Signed   By: Ilona Sorrel M.D.   On: 07/15/2020 17:31   CT ANGIO CHEST PE W OR WO CONTRAST  Result Date: 07/18/2020 CLINICAL DATA:  PE suspected. EXAM: CT ANGIOGRAPHY CHEST WITH CONTRAST TECHNIQUE: Multidetector CT imaging of the chest was performed using the standard protocol during bolus administration of intravenous contrast. Multiplanar CT image reconstructions and MIPs were obtained to evaluate the vascular anatomy. CONTRAST:  110mL OMNIPAQUE IOHEXOL 350 MG/ML SOLN COMPARISON:  07/15/2020 FINDINGS: Cardiovascular: Contrast injection is sufficient to demonstrate satisfactory opacification of the pulmonary arteries to the segmental level. There is no pulmonary embolus or evidence of right heart strain. There are atherosclerotic changes throughout the thoracic aorta without evidence for an aneurysm or dissection. The patient is status post prior CABG. There are coronary artery calcifications. Mediastinum/Nodes: -- No mediastinal lymphadenopathy. -- No hilar lymphadenopathy. -- No axillary lymphadenopathy. -- No supraclavicular lymphadenopathy. -- Normal thyroid  gland where visualized. -  Unremarkable esophagus. Lungs/Pleura: Again noted is a masslike area of consolidation involving the left upper lobe. Advanced fibrotic changes are again noted of the left lung field with significant left-sided volume loss as before. There is a small amount of debris within the trachea. No right-sided pneumothorax. There are trace bilateral pleural effusions. Upper Abdomen: Contrast bolus timing is not optimized for  evaluation of the abdominal organs. Again noted are findings suggestive of cirrhosis. There is cholelithiasis. There are probable nonobstructing stones in the upper pole the right kidney. Musculoskeletal: No chest wall abnormality. No bony spinal canal stenosis. Review of the MIP images confirms the above findings. IMPRESSION: 1. No evidence for acute pulmonary embolus. 2. No significant oval change in left-sided lung findings including a left upper lobe lung mass. 3. Probable cirrhosis and cholelithiasis involving the partially visualized upper abdomen. Aortic Atherosclerosis (ICD10-I70.0). Electronically Signed   By: Constance Holster M.D.   On: 07/18/2020 19:58   DG Chest Port 1 View  Result Date: 07/19/2020 CLINICAL DATA:  Difficulty breathing.  Concern for aspiration. EXAM: PORTABLE CHEST 1 VIEW COMPARISON:  Chest radiograph and chest CT July 18, 2020 FINDINGS: There is extensive fibrosis throughout the left lung. Consolidation left apex region with questionable mass in this area, unchanged. There is mild right base atelectasis. Right lung otherwise clear. Postoperative change consistent with prior right lung transplant on the right. Heart size is within normal limits. Distortion of pulmonary vascularity on the left due to fibrosis is stable. Stents on the left are stable. There is aortic atherosclerosis. No bone lesions. No adenopathy appreciable by radiography. No bone lesions. IMPRESSION: Extensive fibrosis on the left with persistent opacity in the left apex. CT shows evidence suggesting potential mass with associated consolidation in the left apex. Mild atelectasis right base. Right lung otherwise clear. Stable cardiac silhouette. Postoperative changes noted. Aortic Atherosclerosis (ICD10-I70.0). Electronically Signed   By: Lowella Grip III M.D.   On: 07/19/2020 08:53   DG CHEST PORT 1 VIEW  Result Date: 07/18/2020 CLINICAL DATA:  70 year old male with possible pneumonia. Left apical  masslike opacity. History of lung transplant. EXAM: PORTABLE CHEST 1 VIEW COMPARISON:  Chest CT 07/15/2020 and earlier. FINDINGS: Portable AP semi upright view at 0510 hours. Similar patient rotation to the left. Stable superior mediastinal vascular stent. Coarse pulmonary reticulonodular density throughout the left lung in keeping with the fibrosis seen on CT. Confluent left apical opacity persists. No pneumothorax. Stable cardiac size and mediastinal contours. Right lung appears stable and negative. Stable visualized osseous structures. IMPRESSION: 1. Stable from the chest CT 05/14/2021 (please see that report). Mass-like left apical opacity superimposed on chronic fibrotic left lung disease. 2. Negative right lung. Electronically Signed   By: Genevie Ann M.D.   On: 07/18/2020 06:01   DG Chest Portable 1 View  Result Date: 07/15/2020 CLINICAL DATA:  Shortness of breath. History of lung transplant on the right EXAM: PORTABLE CHEST 1 VIEW COMPARISON:  April 24, 2020 FINDINGS: Postoperative change on the right consistent with previous lung transplant. There is a small right pleural effusion. There is slight atelectasis in the right base. The right lung is otherwise clear. There is volume loss on the left with widespread fibrosis throughout the left lung, stable. Heart size is normal. Stable pulmonary vascularity. Stable stents on the left. No appreciable adenopathy. There is aortic atherosclerosis. No bone lesions. IMPRESSION: Volume loss left lung with extensive fibrosis, grossly stable. Right lung transplant clear except for small right  pleural effusion and mild right lower lobe atelectasis. Postoperative changes noted on the right. Stable cardiac silhouette. Stents noted at several sites on the left. No adenopathy appreciable. Aortic Atherosclerosis (ICD10-I70.0). Electronically Signed   By: Lowella Grip III M.D.   On: 07/15/2020 14:45   ECHOCARDIOGRAM COMPLETE  Result Date: 07/19/2020    ECHOCARDIOGRAM  REPORT   Patient Name:   [name] [facility] Date of Exam: 07/19/2020 Medical Rec #:  [id number]         Height:       63.0 in Accession #:    [id number]        Weight:       148.6 lb Date of Birth:  July 16, 1950        BSA:          1.704 m Patient Age:    71 years          BP:           135/74 mmHg Patient Gender: M                 HR:           78 bpm. Exam Location:  Inpatient Procedure: 2D Echo Indications:    acute respiratory distress  History:        Patient has prior history of Echocardiogram examinations, most                 recent 03/04/2013. End stage renal disease; Risk Factors:Sleep                 Apnea, Hypertension and Diabetes.  Sonographer:    Johny Chess Referring Phys: [place]  1. Left ventricular ejection fraction, by estimation, is 70 to 75%. The left ventricle has hyperdynamic function. The left ventricle has no regional wall motion abnormalities. There is mild concentric left ventricular hypertrophy. Left ventricular diastolic parameters are consistent with Grade I diastolic dysfunction (impaired relaxation).  2. RV incompletely visualized. Based on limited views, the right ventricular systolic function is normal. The right ventricular size is normal. There is normal pulmonary artery systolic pressure.  3. The mitral valve is abnormal. Trivial mitral valve regurgitation. Moderate mitral annular calcification.  4. The aortic valve is tricuspid. There is moderate calcification of the aortic valve. There is moderate thickening of the aortic valve. Aortic valve regurgitation is mild. Mild aortic valve stenosis.  5. The inferior vena cava is normal in size with greater than 50% respiratory variability, suggesting right atrial pressure of 3 mmHg. Comparison(s): Compared to prior echo report in 2014, there is now mild aortic stenosis. FINDINGS  Left Ventricle: Left ventricular ejection fraction, by estimation, is 70 to 75%. The left ventricle has hyperdynamic function.  The left ventricle has no regional wall motion abnormalities. The left ventricular internal cavity size was normal in size. There is mild concentric left ventricular hypertrophy. Left ventricular diastolic parameters are consistent with Grade I diastolic dysfunction (impaired relaxation). Right Ventricle: RV incompletely visualized. Based on limited views, the right ventricular size is normal. Right vetricular wall thickness was not well visualized. Right ventricular systolic function is normal. There is normal pulmonary artery systolic pressure. The tricuspid regurgitant velocity is 2.61 m/s, and with an assumed right atrial pressure of 3 mmHg, the estimated right ventricular systolic pressure is 44.0 mmHg. Left Atrium: Left atrial size was normal in size. Right Atrium: Right atrial size was normal in size. Pericardium: There is no evidence of pericardial effusion. Mitral Valve:  The mitral valve is abnormal. There is mild thickening of the mitral valve leaflet(s). There is mild calcification of the mitral valve leaflet(s). Moderate mitral annular calcification. Trivial mitral valve regurgitation. Tricuspid Valve: The tricuspid valve is normal in structure. Tricuspid valve regurgitation is mild. Aortic Valve: The aortic valve is tricuspid. There is moderate calcification of the aortic valve. There is moderate thickening of the aortic valve. Aortic valve regurgitation is mild. Mild aortic stenosis is present. Aortic valve mean gradient measures 9.0 mmHg. Aortic valve peak gradient measures 15.7 mmHg. Aortic valve area, by VTI measures 1.59 cm. Pulmonic Valve: The pulmonic valve was normal in structure. Pulmonic valve regurgitation is not visualized. Aorta: The aortic root and ascending aorta are structurally normal, with no evidence of dilitation. Venous: The inferior vena cava is normal in size with greater than 50% respiratory variability, suggesting right atrial pressure of 3 mmHg. IAS/Shunts: No atrial level  shunt detected by color flow Doppler.  LEFT VENTRICLE PLAX 2D LVIDd:         3.60 cm  Diastology LVIDs:         2.00 cm  LV e' medial:    5.44 cm/s LV PW:         0.90 cm  LV E/e' medial:  17.5 LV IVS:        1.20 cm  LV e' lateral:   8.59 cm/s LVOT diam:     1.90 cm  LV E/e' lateral: 11.1 LV SV:         56 LV SV Index:   33 LVOT Area:     2.84 cm  RIGHT VENTRICLE             IVC RV S prime:     12.30 cm/s  IVC diam: 1.30 cm TAPSE (M-mode): 1.4 cm LEFT ATRIUM           Index       RIGHT ATRIUM           Index LA diam:      3.70 cm 2.17 cm/m  RA Area:     12.40 cm LA Vol (A2C): 34.4 ml 20.18 ml/m RA Volume:   28.90 ml  16.96 ml/m  AORTIC VALVE AV Area (Vmax):    1.52 cm AV Area (Vmean):   1.44 cm AV Area (VTI):     1.59 cm AV Vmax:           198.00 cm/s AV Vmean:          137.000 cm/s AV VTI:            0.355 m AV Peak Grad:      15.7 mmHg AV Mean Grad:      9.0 mmHg LVOT Vmax:         106.25 cm/s LVOT Vmean:        69.400 cm/s LVOT VTI:          0.199 m LVOT/AV VTI ratio: 0.56  AORTA Ao Root diam: 2.80 cm Ao Asc diam:  2.90 cm MITRAL VALVE                TRICUSPID VALVE MV Area (PHT): 2.66 cm     TR Peak grad:   27.2 mmHg MV Decel Time: 285 msec     TR Vmax:        261.00 cm/s MV E velocity: 95.10 cm/s MV A velocity: 108.00 cm/s  SHUNTS MV E/A ratio:  0.88  Systemic VTI:  0.20 m                             Systemic Diam: 1.90 cm Gwyndolyn Kaufman MD Electronically signed by Gwyndolyn Kaufman MD Signature Date/Time: 07/19/2020/11:29:29 AM    Final      Subjective: Eager to go home today  Discharge Exam: Vitals:   07/21/20 1000 07/21/20 1022  BP: 91/60 (!) 96/53  Pulse:    Resp: 20 19  Temp:  98.2 F (36.8 C)  SpO2:  93%   Vitals:   07/21/20 0930 07/21/20 0945 07/21/20 1000 07/21/20 1022  BP: (!) 71/43 (!) 144/72 91/60 (!) 96/53  Pulse:      Resp: 20  20 19   Temp:    98.2 F (36.8 C)  TempSrc:    Oral  SpO2:    93%  Weight:    66 kg  Height:        General: Pt is alert,  awake, not in acute distress Cardiovascular: RRR, S1/S2 +, no rubs, no gallops Respiratory: CTA bilaterally, no wheezing, no rhonchi Abdominal: Soft, NT, ND, bowel sounds + Extremities: no edema, no cyanosis   The results of significant diagnostics from this hospitalization (including imaging, microbiology, ancillary and laboratory) are listed below for reference.     Microbiology: Recent Results (from the past 240 hour(s))  Resp Panel by RT-PCR (Flu A&B, Covid) Nasopharyngeal Swab     Status: None   Collection Time: 07/15/20  2:22 PM   Specimen: Nasopharyngeal Swab; Nasopharyngeal(NP) swabs in vial transport medium  Result Value Ref Range Status   SARS Coronavirus 2 by RT PCR NEGATIVE NEGATIVE Final    Comment: (NOTE) SARS-CoV-2 target nucleic acids are NOT DETECTED.  The SARS-CoV-2 RNA is generally detectable in upper respiratory specimens during the acute phase of infection. The lowest concentration of SARS-CoV-2 viral copies this assay can detect is 138 copies/mL. A negative result does not preclude SARS-Cov-2 infection and should not be used as the sole basis for treatment or other patient management decisions. A negative result may occur with  improper specimen collection/handling, submission of specimen other than nasopharyngeal swab, presence of viral mutation(s) within the areas targeted by this assay, and inadequate number of viral copies(<138 copies/mL). A negative result must be combined with clinical observations, patient history, and epidemiological information. The expected result is Negative.  Fact Sheet for Patients:  EntrepreneurPulse.com.au  Fact Sheet for Healthcare Providers:  IncredibleEmployment.be  This test is no t yet approved or cleared by the [place] FDA and  has been authorized for detection and/or diagnosis of SARS-CoV-2 by FDA under an Emergency Use Authorization (EUA). This EUA will remain  in effect  (meaning this test can be used) for the duration of the COVID-19 declaration under Section 564(b)(1) of the Act, 21 U.S.C.section 360bbb-3(b)(1), unless the authorization is terminated  or revoked sooner.       Influenza A by PCR NEGATIVE NEGATIVE Final   Influenza B by PCR NEGATIVE NEGATIVE Final    Comment: (NOTE) The Xpert Xpress SARS-CoV-2/FLU/RSV plus assay is intended as an aid in the diagnosis of influenza from Nasopharyngeal swab specimens and should not be used as a sole basis for treatment. Nasal washings and aspirates are unacceptable for Xpert Xpress SARS-CoV-2/FLU/RSV testing.  Fact Sheet for Patients: EntrepreneurPulse.com.au  Fact Sheet for Healthcare Providers: IncredibleEmployment.be  This test is not yet approved or cleared by the [place] FDA and has been  authorized for detection and/or diagnosis of SARS-CoV-2 by FDA under an Emergency Use Authorization (EUA). This EUA will remain in effect (meaning this test can be used) for the duration of the COVID-19 declaration under Section 564(b)(1) of the Act, 21 U.S.C. section 360bbb-3(b)(1), unless the authorization is terminated or revoked.  Performed at [facility], [place] [address] [place], [place] [address]   Culture, blood (routine x 2)     Status: None (Preliminary result)   Collection Time: 07/17/20  6:00 PM   Specimen: BLOOD RIGHT HAND  Result Value Ref Range Status   Specimen Description BLOOD RIGHT HAND  Final   Special Requests   Final    BOTTLES DRAWN AEROBIC AND ANAEROBIC Blood Culture results may not be optimal due to an inadequate volume of blood received in culture bottles   Culture   Final    NO GROWTH 3 DAYS Performed at [facility], [place] [address] [place], [place] [address]    Report Status PENDING  Incomplete  Culture, blood (routine x 2)     Status: None (Preliminary result)   Collection Time: 07/17/20  6:07 PM   Specimen: BLOOD   Result Value Ref Range Status   Specimen Description BLOOD RIGHT ANTECUBITAL  Final   Special Requests   Final    BOTTLES DRAWN AEROBIC AND ANAEROBIC Blood Culture adequate volume   Culture   Final    NO GROWTH 3 DAYS Performed at [facility], [place] [address], [place], [place] [address]    Report Status PENDING  Incomplete  MRSA PCR Screening     Status: None   Collection Time: 07/18/20  5:57 AM   Specimen: Nasopharyngeal  Result Value Ref Range Status   MRSA by PCR NEGATIVE NEGATIVE Final    Comment:        The GeneXpert MRSA Assay (FDA approved for NASAL specimens only), is one component of a comprehensive MRSA colonization surveillance program. It is not intended to diagnose MRSA infection nor to guide or monitor treatment for MRSA infections. Performed at [facility], [place] [address] [place], [place] [address]   Respiratory (~20 pathogens) panel by PCR     Status: None   Collection Time: 07/19/20  6:14 PM   Specimen: Nasopharyngeal Swab; Respiratory  Result Value Ref Range Status   Adenovirus NOT DETECTED NOT DETECTED Final   Coronavirus 229E NOT DETECTED NOT DETECTED Final    Comment: (NOTE) The Coronavirus on the Respiratory Panel, DOES NOT test for the novel  Coronavirus (2019 nCoV)    Coronavirus HKU1 NOT DETECTED NOT DETECTED Final   Coronavirus NL63 NOT DETECTED NOT DETECTED Final   Coronavirus OC43 NOT DETECTED NOT DETECTED Final   Metapneumovirus NOT DETECTED NOT DETECTED Final   Rhinovirus / Enterovirus NOT DETECTED NOT DETECTED Final   Influenza A NOT DETECTED NOT DETECTED Final   Influenza B NOT DETECTED NOT DETECTED Final   Parainfluenza Virus 1 NOT DETECTED NOT DETECTED Final   Parainfluenza Virus 2 NOT DETECTED NOT DETECTED Final   Parainfluenza Virus 3 NOT DETECTED NOT DETECTED Final   Parainfluenza Virus 4 NOT DETECTED NOT DETECTED Final   Respiratory Syncytial Virus NOT DETECTED NOT DETECTED Final   Bordetella pertussis NOT DETECTED  NOT DETECTED Final   Bordetella Parapertussis NOT DETECTED NOT DETECTED Final   Chlamydophila pneumoniae NOT DETECTED NOT DETECTED Final   Mycoplasma pneumoniae NOT DETECTED NOT DETECTED Final    Comment: Performed at [facility], [address] [place], [place] [address]     Labs: BNP (last 3 results) Recent Labs    09/03/19 1745 07/17/20 1436  BNP 324.0* 734.2*   Basic Metabolic Panel: Recent Labs  Lab 07/17/20 1320 07/18/20 0553 07/19/20 0626 07/20/20 0436 07/21/20 0700  NA 139 140 137 136 134*  K 4.0 4.2 4.7 5.2* 4.9  CL 96* 99 95* 94* 91*  CO2 26 29 28 24  20*  GLUCOSE 128* 72 89 82 146*  BUN 11 20 32* 48* 60*  CREATININE 3.54* 5.16* 7.21* 9.54* 11.52*  CALCIUM 8.6* 7.9* 8.2* 8.1* 8.1*  MG  --  2.0 2.0  --   --   PHOS  --   --  3.9  --  5.9*   Liver Function Tests: Recent Labs  Lab 07/18/20 0553 07/20/20 0436 07/21/20 0700  AST 30 26  --   ALT 17 13  --   ALKPHOS 327* 285*  --   BILITOT 1.1 2.3*  --   PROT 5.7* 5.9*  --   ALBUMIN 2.3* 2.0* 2.1*   No results for input(s): LIPASE, AMYLASE in the last 168 hours. No results for input(s): AMMONIA in the last 168 hours. CBC: Recent Labs  Lab 07/15/20 1354 07/17/20 1320 07/18/20 0553 07/21/20 0700  WBC 3.4* 3.8* 3.2* 6.4  HGB 12.5* 12.3* 10.4* 10.9*  HCT 40.9 38.2* 31.4* 33.8*  MCV 112.4* 107.9* 106.4* 105.0*  PLT 168 223 201 236   Cardiac Enzymes: No results for input(s): CKTOTAL, CKMB, CKMBINDEX, TROPONINI in the last 168 hours. BNP: Invalid input(s): POCBNP CBG: Recent Labs  Lab 07/20/20 1100 07/20/20 1126 07/20/20 1603 07/20/20 2133 07/21/20 0618  GLUCAP 65* 75 115* 208* 79   D-Dimer No results for input(s): DDIMER in the last 72 hours. Hgb A1c No results for input(s): HGBA1C in the last 72 hours. Lipid Profile No results for input(s): CHOL, HDL, LDLCALC, TRIG, CHOLHDL, LDLDIRECT in the last 72 hours. Thyroid function studies No results for input(s): TSH, T4TOTAL, T3FREE,  THYROIDAB in the last 72 hours.  Invalid input(s): FREET3 Anemia work up No results for input(s): VITAMINB12, FOLATE, FERRITIN, TIBC, IRON, RETICCTPCT in the last 72 hours. Urinalysis    Component Value Date/Time   COLORURINE AMBER (A) 12/31/2015 0515   APPEARANCEUR CLOUDY (A) 12/31/2015 0515   LABSPEC 1.016 12/31/2015 0515   PHURINE 7.0 12/31/2015 0515   GLUCOSEU NEGATIVE 12/31/2015 0515   HGBUR NEGATIVE 12/31/2015 0515   BILIRUBINUR SMALL (A) 12/31/2015 0515   KETONESUR 15 (A) 12/31/2015 0515   PROTEINUR 100 (A) 12/31/2015 0515   UROBILINOGEN 2.0 (H) 08/15/2013 1916   NITRITE NEGATIVE 12/31/2015 0515   LEUKOCYTESUR NEGATIVE 12/31/2015 0515   Sepsis Labs Invalid input(s): PROCALCITONIN,  WBC,  LACTICIDVEN Microbiology Recent Results (from the past 240 hour(s))  Resp Panel by RT-PCR (Flu A&B, Covid) Nasopharyngeal Swab     Status: None   Collection Time: 07/15/20  2:22 PM   Specimen: Nasopharyngeal Swab; Nasopharyngeal(NP) swabs in vial transport medium  Result Value Ref Range Status   SARS Coronavirus 2 by RT PCR NEGATIVE NEGATIVE Final    Comment: (NOTE) SARS-CoV-2 target nucleic acids are NOT DETECTED.  The SARS-CoV-2 RNA is generally detectable in upper respiratory specimens during the acute phase of infection. The lowest concentration of SARS-CoV-2 viral copies this assay can detect is 138 copies/mL. A negative result does not preclude SARS-Cov-2 infection and should not be used as the sole basis for treatment or other patient management decisions. A  negative result may occur with  improper specimen collection/handling, submission of specimen other than nasopharyngeal swab, presence of viral mutation(s) within the areas targeted by this assay, and inadequate number of viral copies(<138 copies/mL). A negative result must be combined with clinical observations, patient history, and epidemiological information. The expected result is Negative.  Fact Sheet for Patients:   EntrepreneurPulse.com.au  Fact Sheet for Healthcare Providers:  IncredibleEmployment.be  This test is no t yet approved or cleared by the [place] FDA and  has been authorized for detection and/or diagnosis of SARS-CoV-2 by FDA under an Emergency Use Authorization (EUA). This EUA will remain  in effect (meaning this test can be used) for the duration of the COVID-19 declaration under Section 564(b)(1) of the Act, 21 U.S.C.section 360bbb-3(b)(1), unless the authorization is terminated  or revoked sooner.       Influenza A by PCR NEGATIVE NEGATIVE Final   Influenza B by PCR NEGATIVE NEGATIVE Final    Comment: (NOTE) The Xpert Xpress SARS-CoV-2/FLU/RSV plus assay is intended as an aid in the diagnosis of influenza from Nasopharyngeal swab specimens and should not be used as a sole basis for treatment. Nasal washings and aspirates are unacceptable for Xpert Xpress SARS-CoV-2/FLU/RSV testing.  Fact Sheet for Patients: EntrepreneurPulse.com.au  Fact Sheet for Healthcare Providers: IncredibleEmployment.be  This test is not yet approved or cleared by the [place] FDA and has been authorized for detection and/or diagnosis of SARS-CoV-2 by FDA under an Emergency Use Authorization (EUA). This EUA will remain in effect (meaning this test can be used) for the duration of the COVID-19 declaration under Section 564(b)(1) of the Act, 21 U.S.C. section 360bbb-3(b)(1), unless the authorization is terminated or revoked.  Performed at [facility], [place] [address], [place], [place] [address]   Culture, blood (routine x 2)     Status: None (Preliminary result)   Collection Time: 07/17/20  6:00 PM   Specimen: BLOOD RIGHT HAND  Result Value Ref Range Status   Specimen Description BLOOD RIGHT HAND  Final   Special Requests   Final    BOTTLES DRAWN AEROBIC AND ANAEROBIC Blood Culture results may not be  optimal due to an inadequate volume of blood received in culture bottles   Culture   Final    NO GROWTH 3 DAYS Performed at [facility], [place] [address], [place], [place] [address]    Report Status PENDING  Incomplete  Culture, blood (routine x 2)     Status: None (Preliminary result)   Collection Time: 07/17/20  6:07 PM   Specimen: BLOOD  Result Value Ref Range Status   Specimen Description BLOOD RIGHT ANTECUBITAL  Final   Special Requests   Final    BOTTLES DRAWN AEROBIC AND ANAEROBIC Blood Culture adequate volume   Culture   Final    NO GROWTH 3 DAYS Performed at [facility], [place] [address] [place], [place] [address]    Report Status PENDING  Incomplete  MRSA PCR Screening     Status: None   Collection Time: 07/18/20  5:57 AM   Specimen: Nasopharyngeal  Result Value Ref Range Status   MRSA by PCR NEGATIVE NEGATIVE Final    Comment:        The GeneXpert MRSA Assay (FDA approved for NASAL specimens only), is one component of a comprehensive MRSA colonization surveillance program. It is not intended to diagnose MRSA infection nor to guide or monitor treatment for MRSA infections. Performed at [facility], [address] [place], [place] [address]   Respiratory (~20 pathogens) panel by PCR     Status: None   Collection Time: 07/19/20  6:14 PM   Specimen: Nasopharyngeal Swab; Respiratory  Result Value Ref Range Status   Adenovirus NOT DETECTED NOT DETECTED Final   Coronavirus 229E NOT DETECTED NOT DETECTED Final    Comment: (NOTE) The Coronavirus on the Respiratory Panel, DOES NOT test for the novel  Coronavirus (2019 nCoV)    Coronavirus HKU1 NOT DETECTED NOT DETECTED Final   Coronavirus NL63 NOT DETECTED NOT DETECTED Final   Coronavirus OC43 NOT DETECTED NOT DETECTED Final   Metapneumovirus NOT DETECTED NOT DETECTED Final   Rhinovirus / Enterovirus NOT DETECTED NOT DETECTED Final   Influenza A NOT DETECTED NOT DETECTED Final   Influenza B NOT  DETECTED NOT DETECTED Final   Parainfluenza Virus 1 NOT DETECTED NOT DETECTED Final   Parainfluenza Virus 2 NOT DETECTED NOT DETECTED Final   Parainfluenza Virus 3 NOT DETECTED NOT DETECTED Final   Parainfluenza Virus 4 NOT DETECTED NOT DETECTED Final   Respiratory Syncytial Virus NOT DETECTED NOT DETECTED Final   Bordetella pertussis NOT DETECTED NOT DETECTED Final   Bordetella Parapertussis NOT DETECTED NOT DETECTED Final   Chlamydophila pneumoniae NOT DETECTED NOT DETECTED Final   Mycoplasma pneumoniae NOT DETECTED NOT DETECTED Final    Comment: Performed at [facility], [place]. [address] [place], [place] [address]   Time spent: 30 min  SIGNED:   Marylu Lund, MD  [facility] 07/21/2020, 1:42 PM  If 7PM-7AM, please contact night-coverage

## 2020-07-21 NOTE — Progress Notes (Signed)
OT Cancellation Note  Patient Details Name: [name] MRN: [id number] DOB: 1950/10/14   Cancelled Treatment:    Reason Eval/Treat Not Completed: OT screened, no needs identified, will sign off (Pt recently back from dialysis. Pt plans to be discharged today or tomorrow with assist from sister/family. Pt reports that he can assist with ADL/iADL and mobility, but has family at home to assist as needed. Pt declined OOB; per PT eval, he did well.)  Pt reports that he has all AD and DME needed.  Pt's O2 >90% on RA, HR 66 BPM at rest.  Jefferey Pica, OTR/L Acute Rehabilitation Services Pager: [phone] Office: [place]  C 07/21/2020, 12:11 PM

## 2020-07-21 NOTE — Telephone Encounter (Signed)
Called and spoke with pt and he stated to make the appt and mychart would notify him of this appt date and time.  This was done and nothing further is needed.

## 2020-07-21 NOTE — Progress Notes (Signed)
Discharge teaching complete. Meds, diet, activity, follow up appointments reviewed and all questions answered. Copy of instructions given to patient and med brought to bedside by TOC.

## 2020-07-21 NOTE — Progress Notes (Signed)
Patient back to room from dialysis

## 2020-07-21 NOTE — Procedures (Signed)
Patient seen on Hemodialysis (rescheduled from yesterday)- no complaints and looking forward to going home today with OP f/u in 2 days at [facility] to assess lung mass. BP 132/80   Pulse 77   Temp 97.7 F (36.5 C) (Oral)   Resp (!) 22   Ht 5\' 3"  (1.6 m)   Wt 67.6 kg   SpO2 92%   BMI 26.40 kg/m   QB 4, UF goal 2L Tolerating treatment without complaints at this time.   Elmarie Shiley MD [facility]. Office # [phone] Pager # [phone] 8:47 AM

## 2020-07-22 ENCOUNTER — Telehealth: Payer: Self-pay | Admitting: Nephrology

## 2020-07-22 LAB — CULTURE, BLOOD (ROUTINE X 2)
Culture: NO GROWTH
Culture: NO GROWTH
Special Requests: ADEQUATE

## 2020-07-22 LAB — FUNGITELL, SERUM: Fungitell Result: 64 pg/mL (ref <80)

## 2020-07-22 NOTE — Telephone Encounter (Signed)
Transition of Care Contact from [place]   Date of Discharge: 07/21/20  Date of Contact: 07/22/20 Method of contact: phone Talked to patient   Patient contacted to discuss transition of care form recent hospitaliztion. Patient was admitted to [facility] from 2/11 to 07/21/20 with the discharge diagnosis of new Lung mass and dyspnea.     Medication changes were reviewed - Advised patient Augmentin dose to high for ESRD patient.  He had already picked up the medication and did not want a new Rx called in at lower dose.  Advised to take 1/2 tablet 875-125mg  qd with extra 1/2 tablet after dialysis x 7 days.    Patient should have followed up with outpatient dialysis center today but he did not go due to having dialysis here yesterday.  Advised him to go to dialysis on Friday.   Other follow up needs include appointment at [facility] tomorrow.    Jen Mow, PA-C [facility] Pager: [phone]

## 2020-07-23 DIAGNOSIS — J849 Interstitial pulmonary disease, unspecified: Secondary | ICD-10-CM | POA: Diagnosis not present

## 2020-07-23 DIAGNOSIS — Z5181 Encounter for therapeutic drug level monitoring: Secondary | ICD-10-CM | POA: Diagnosis not present

## 2020-07-23 DIAGNOSIS — E43 Unspecified severe protein-calorie malnutrition: Secondary | ICD-10-CM | POA: Diagnosis not present

## 2020-07-23 DIAGNOSIS — K219 Gastro-esophageal reflux disease without esophagitis: Secondary | ICD-10-CM | POA: Diagnosis not present

## 2020-07-23 DIAGNOSIS — C3412 Malignant neoplasm of upper lobe, left bronchus or lung: Secondary | ICD-10-CM | POA: Diagnosis not present

## 2020-07-23 DIAGNOSIS — Z992 Dependence on renal dialysis: Secondary | ICD-10-CM | POA: Diagnosis not present

## 2020-07-23 DIAGNOSIS — R0602 Shortness of breath: Secondary | ICD-10-CM | POA: Diagnosis not present

## 2020-07-23 DIAGNOSIS — R079 Chest pain, unspecified: Secondary | ICD-10-CM | POA: Diagnosis not present

## 2020-07-23 DIAGNOSIS — R0781 Pleurodynia: Secondary | ICD-10-CM | POA: Diagnosis not present

## 2020-07-23 DIAGNOSIS — R0600 Dyspnea, unspecified: Secondary | ICD-10-CM | POA: Diagnosis not present

## 2020-07-23 DIAGNOSIS — J9601 Acute respiratory failure with hypoxia: Secondary | ICD-10-CM | POA: Diagnosis not present

## 2020-07-23 DIAGNOSIS — N186 End stage renal disease: Secondary | ICD-10-CM | POA: Diagnosis not present

## 2020-07-23 DIAGNOSIS — G928 Other toxic encephalopathy: Secondary | ICD-10-CM | POA: Diagnosis not present

## 2020-07-23 DIAGNOSIS — Z942 Lung transplant status: Secondary | ICD-10-CM | POA: Diagnosis not present

## 2020-07-23 DIAGNOSIS — D849 Immunodeficiency, unspecified: Secondary | ICD-10-CM | POA: Diagnosis not present

## 2020-07-23 DIAGNOSIS — J690 Pneumonitis due to inhalation of food and vomit: Secondary | ICD-10-CM | POA: Diagnosis not present

## 2020-07-23 DIAGNOSIS — J9621 Acute and chronic respiratory failure with hypoxia: Secondary | ICD-10-CM | POA: Diagnosis not present

## 2020-07-23 DIAGNOSIS — R918 Other nonspecific abnormal finding of lung field: Secondary | ICD-10-CM | POA: Diagnosis not present

## 2020-07-23 DIAGNOSIS — I120 Hypertensive chronic kidney disease with stage 5 chronic kidney disease or end stage renal disease: Secondary | ICD-10-CM | POA: Diagnosis not present

## 2020-07-23 DIAGNOSIS — T86818 Other complications of lung transplant: Secondary | ICD-10-CM | POA: Diagnosis not present

## 2020-07-23 DIAGNOSIS — Z79899 Other long term (current) drug therapy: Secondary | ICD-10-CM | POA: Diagnosis not present

## 2020-07-23 DIAGNOSIS — G4733 Obstructive sleep apnea (adult) (pediatric): Secondary | ICD-10-CM | POA: Diagnosis not present

## 2020-07-23 DIAGNOSIS — I951 Orthostatic hypotension: Secondary | ICD-10-CM | POA: Diagnosis not present

## 2020-07-24 DIAGNOSIS — K219 Gastro-esophageal reflux disease without esophagitis: Secondary | ICD-10-CM | POA: Diagnosis not present

## 2020-07-24 DIAGNOSIS — J9811 Atelectasis: Secondary | ICD-10-CM | POA: Diagnosis not present

## 2020-07-24 DIAGNOSIS — R0989 Other specified symptoms and signs involving the circulatory and respiratory systems: Secondary | ICD-10-CM | POA: Diagnosis not present

## 2020-07-24 DIAGNOSIS — I5032 Chronic diastolic (congestive) heart failure: Secondary | ICD-10-CM | POA: Diagnosis not present

## 2020-07-24 DIAGNOSIS — J849 Interstitial pulmonary disease, unspecified: Secondary | ICD-10-CM | POA: Diagnosis present

## 2020-07-24 DIAGNOSIS — R6521 Severe sepsis with septic shock: Secondary | ICD-10-CM | POA: Diagnosis not present

## 2020-07-24 DIAGNOSIS — R202 Paresthesia of skin: Secondary | ICD-10-CM | POA: Diagnosis not present

## 2020-07-24 DIAGNOSIS — R4182 Altered mental status, unspecified: Secondary | ICD-10-CM | POA: Diagnosis not present

## 2020-07-24 DIAGNOSIS — Z792 Long term (current) use of antibiotics: Secondary | ICD-10-CM | POA: Diagnosis not present

## 2020-07-24 DIAGNOSIS — R109 Unspecified abdominal pain: Secondary | ICD-10-CM | POA: Diagnosis not present

## 2020-07-24 DIAGNOSIS — Z20822 Contact with and (suspected) exposure to covid-19: Secondary | ICD-10-CM | POA: Diagnosis present

## 2020-07-24 DIAGNOSIS — K828 Other specified diseases of gallbladder: Secondary | ICD-10-CM | POA: Diagnosis not present

## 2020-07-24 DIAGNOSIS — Z9225 Personal history of immunosupression therapy: Secondary | ICD-10-CM | POA: Diagnosis not present

## 2020-07-24 DIAGNOSIS — Z66 Do not resuscitate: Secondary | ICD-10-CM | POA: Diagnosis not present

## 2020-07-24 DIAGNOSIS — J9601 Acute respiratory failure with hypoxia: Secondary | ICD-10-CM | POA: Diagnosis not present

## 2020-07-24 DIAGNOSIS — I10 Essential (primary) hypertension: Secondary | ICD-10-CM | POA: Diagnosis not present

## 2020-07-24 DIAGNOSIS — R1310 Dysphagia, unspecified: Secondary | ICD-10-CM | POA: Diagnosis not present

## 2020-07-24 DIAGNOSIS — R0600 Dyspnea, unspecified: Secondary | ICD-10-CM | POA: Diagnosis not present

## 2020-07-24 DIAGNOSIS — T86818 Other complications of lung transplant: Secondary | ICD-10-CM | POA: Diagnosis not present

## 2020-07-24 DIAGNOSIS — K7689 Other specified diseases of liver: Secondary | ICD-10-CM | POA: Diagnosis not present

## 2020-07-24 DIAGNOSIS — I1311 Hypertensive heart and chronic kidney disease without heart failure, with stage 5 chronic kidney disease, or end stage renal disease: Secondary | ICD-10-CM | POA: Diagnosis present

## 2020-07-24 DIAGNOSIS — R1312 Dysphagia, oropharyngeal phase: Secondary | ICD-10-CM | POA: Diagnosis not present

## 2020-07-24 DIAGNOSIS — E782 Mixed hyperlipidemia: Secondary | ICD-10-CM | POA: Diagnosis not present

## 2020-07-24 DIAGNOSIS — J9621 Acute and chronic respiratory failure with hypoxia: Secondary | ICD-10-CM | POA: Diagnosis present

## 2020-07-24 DIAGNOSIS — G4700 Insomnia, unspecified: Secondary | ICD-10-CM | POA: Diagnosis not present

## 2020-07-24 DIAGNOSIS — Z992 Dependence on renal dialysis: Secondary | ICD-10-CM | POA: Diagnosis not present

## 2020-07-24 DIAGNOSIS — C3490 Malignant neoplasm of unspecified part of unspecified bronchus or lung: Secondary | ICD-10-CM | POA: Diagnosis not present

## 2020-07-24 DIAGNOSIS — J811 Chronic pulmonary edema: Secondary | ICD-10-CM | POA: Diagnosis not present

## 2020-07-24 DIAGNOSIS — F05 Delirium due to known physiological condition: Secondary | ICD-10-CM | POA: Diagnosis not present

## 2020-07-24 DIAGNOSIS — E785 Hyperlipidemia, unspecified: Secondary | ICD-10-CM | POA: Diagnosis not present

## 2020-07-24 DIAGNOSIS — R918 Other nonspecific abnormal finding of lung field: Secondary | ICD-10-CM | POA: Diagnosis not present

## 2020-07-24 DIAGNOSIS — E1151 Type 2 diabetes mellitus with diabetic peripheral angiopathy without gangrene: Secondary | ICD-10-CM | POA: Diagnosis present

## 2020-07-24 DIAGNOSIS — R140 Abdominal distension (gaseous): Secondary | ICD-10-CM | POA: Diagnosis not present

## 2020-07-24 DIAGNOSIS — N186 End stage renal disease: Secondary | ICD-10-CM | POA: Diagnosis present

## 2020-07-24 DIAGNOSIS — D631 Anemia in chronic kidney disease: Secondary | ICD-10-CM | POA: Diagnosis present

## 2020-07-24 DIAGNOSIS — A403 Sepsis due to Streptococcus pneumoniae: Secondary | ICD-10-CM | POA: Diagnosis not present

## 2020-07-24 DIAGNOSIS — R29898 Other symptoms and signs involving the musculoskeletal system: Secondary | ICD-10-CM | POA: Diagnosis not present

## 2020-07-24 DIAGNOSIS — C3412 Malignant neoplasm of upper lobe, left bronchus or lung: Secondary | ICD-10-CM | POA: Diagnosis present

## 2020-07-24 DIAGNOSIS — Z4659 Encounter for fitting and adjustment of other gastrointestinal appliance and device: Secondary | ICD-10-CM | POA: Diagnosis not present

## 2020-07-24 DIAGNOSIS — E1122 Type 2 diabetes mellitus with diabetic chronic kidney disease: Secondary | ICD-10-CM | POA: Diagnosis present

## 2020-07-24 DIAGNOSIS — R569 Unspecified convulsions: Secondary | ICD-10-CM | POA: Diagnosis not present

## 2020-07-24 DIAGNOSIS — F329 Major depressive disorder, single episode, unspecified: Secondary | ICD-10-CM | POA: Diagnosis not present

## 2020-07-24 DIAGNOSIS — H409 Unspecified glaucoma: Secondary | ICD-10-CM | POA: Diagnosis not present

## 2020-07-24 DIAGNOSIS — G928 Other toxic encephalopathy: Secondary | ICD-10-CM | POA: Diagnosis not present

## 2020-07-24 DIAGNOSIS — E43 Unspecified severe protein-calorie malnutrition: Secondary | ICD-10-CM | POA: Diagnosis not present

## 2020-07-24 DIAGNOSIS — J690 Pneumonitis due to inhalation of food and vomit: Secondary | ICD-10-CM | POA: Diagnosis not present

## 2020-07-24 DIAGNOSIS — R64 Cachexia: Secondary | ICD-10-CM | POA: Diagnosis not present

## 2020-07-24 DIAGNOSIS — J159 Unspecified bacterial pneumonia: Secondary | ICD-10-CM | POA: Diagnosis not present

## 2020-07-24 DIAGNOSIS — Z4682 Encounter for fitting and adjustment of non-vascular catheter: Secondary | ICD-10-CM | POA: Diagnosis not present

## 2020-07-24 DIAGNOSIS — Z1159 Encounter for screening for other viral diseases: Secondary | ICD-10-CM | POA: Diagnosis not present

## 2020-07-24 DIAGNOSIS — I270 Primary pulmonary hypertension: Secondary | ICD-10-CM | POA: Diagnosis not present

## 2020-07-24 DIAGNOSIS — R400 Somnolence: Secondary | ICD-10-CM | POA: Diagnosis not present

## 2020-07-24 DIAGNOSIS — G4733 Obstructive sleep apnea (adult) (pediatric): Secondary | ICD-10-CM | POA: Diagnosis not present

## 2020-07-24 DIAGNOSIS — Z789 Other specified health status: Secondary | ICD-10-CM | POA: Diagnosis not present

## 2020-07-24 DIAGNOSIS — R9389 Abnormal findings on diagnostic imaging of other specified body structures: Secondary | ICD-10-CM | POA: Diagnosis not present

## 2020-07-24 DIAGNOSIS — E1142 Type 2 diabetes mellitus with diabetic polyneuropathy: Secondary | ICD-10-CM | POA: Diagnosis present

## 2020-07-24 DIAGNOSIS — B370 Candidal stomatitis: Secondary | ICD-10-CM | POA: Diagnosis not present

## 2020-07-24 DIAGNOSIS — T86819 Unspecified complication of lung transplant: Secondary | ICD-10-CM | POA: Diagnosis not present

## 2020-07-24 DIAGNOSIS — N2581 Secondary hyperparathyroidism of renal origin: Secondary | ICD-10-CM | POA: Diagnosis present

## 2020-07-24 DIAGNOSIS — R748 Abnormal levels of other serum enzymes: Secondary | ICD-10-CM | POA: Diagnosis not present

## 2020-07-24 DIAGNOSIS — K7460 Unspecified cirrhosis of liver: Secondary | ICD-10-CM | POA: Diagnosis present

## 2020-07-24 DIAGNOSIS — Z7901 Long term (current) use of anticoagulants: Secondary | ICD-10-CM | POA: Diagnosis not present

## 2020-07-24 DIAGNOSIS — Z85118 Personal history of other malignant neoplasm of bronchus and lung: Secondary | ICD-10-CM | POA: Diagnosis not present

## 2020-07-24 DIAGNOSIS — E119 Type 2 diabetes mellitus without complications: Secondary | ICD-10-CM | POA: Diagnosis not present

## 2020-07-24 DIAGNOSIS — Z743 Need for continuous supervision: Secondary | ICD-10-CM | POA: Diagnosis not present

## 2020-07-24 DIAGNOSIS — J8410 Pulmonary fibrosis, unspecified: Secondary | ICD-10-CM | POA: Diagnosis not present

## 2020-07-24 DIAGNOSIS — J90 Pleural effusion, not elsewhere classified: Secondary | ICD-10-CM | POA: Diagnosis not present

## 2020-07-24 DIAGNOSIS — I70219 Atherosclerosis of native arteries of extremities with intermittent claudication, unspecified extremity: Secondary | ICD-10-CM | POA: Diagnosis present

## 2020-07-24 DIAGNOSIS — D84821 Immunodeficiency due to drugs: Secondary | ICD-10-CM | POA: Diagnosis present

## 2020-07-24 DIAGNOSIS — Z681 Body mass index (BMI) 19 or less, adult: Secondary | ICD-10-CM | POA: Diagnosis not present

## 2020-07-24 DIAGNOSIS — E46 Unspecified protein-calorie malnutrition: Secondary | ICD-10-CM | POA: Diagnosis not present

## 2020-07-24 DIAGNOSIS — M6281 Muscle weakness (generalized): Secondary | ICD-10-CM | POA: Diagnosis not present

## 2020-07-24 DIAGNOSIS — Z4824 Encounter for aftercare following lung transplant: Secondary | ICD-10-CM | POA: Diagnosis not present

## 2020-07-24 DIAGNOSIS — R531 Weakness: Secondary | ICD-10-CM | POA: Diagnosis not present

## 2020-07-24 DIAGNOSIS — C3492 Malignant neoplasm of unspecified part of left bronchus or lung: Secondary | ICD-10-CM | POA: Diagnosis not present

## 2020-07-24 DIAGNOSIS — K8020 Calculus of gallbladder without cholecystitis without obstruction: Secondary | ICD-10-CM | POA: Diagnosis not present

## 2020-07-24 DIAGNOSIS — D849 Immunodeficiency, unspecified: Secondary | ICD-10-CM | POA: Diagnosis not present

## 2020-07-24 DIAGNOSIS — Z942 Lung transplant status: Secondary | ICD-10-CM | POA: Diagnosis not present

## 2020-07-24 DIAGNOSIS — I959 Hypotension, unspecified: Secondary | ICD-10-CM | POA: Diagnosis not present

## 2020-07-24 DIAGNOSIS — E1165 Type 2 diabetes mellitus with hyperglycemia: Secondary | ICD-10-CM | POA: Diagnosis not present

## 2020-07-24 DIAGNOSIS — Z8709 Personal history of other diseases of the respiratory system: Secondary | ICD-10-CM | POA: Diagnosis not present

## 2020-07-24 DIAGNOSIS — E440 Moderate protein-calorie malnutrition: Secondary | ICD-10-CM | POA: Diagnosis not present

## 2020-07-24 DIAGNOSIS — D509 Iron deficiency anemia, unspecified: Secondary | ICD-10-CM | POA: Diagnosis present

## 2020-07-24 DIAGNOSIS — R079 Chest pain, unspecified: Secondary | ICD-10-CM | POA: Diagnosis not present

## 2020-07-24 DIAGNOSIS — A419 Sepsis, unspecified organism: Secondary | ICD-10-CM | POA: Diagnosis not present

## 2020-07-27 DIAGNOSIS — R918 Other nonspecific abnormal finding of lung field: Secondary | ICD-10-CM | POA: Diagnosis not present

## 2020-07-29 DIAGNOSIS — N186 End stage renal disease: Secondary | ICD-10-CM | POA: Diagnosis not present

## 2020-07-29 DIAGNOSIS — I270 Primary pulmonary hypertension: Secondary | ICD-10-CM | POA: Diagnosis not present

## 2020-07-29 DIAGNOSIS — G4700 Insomnia, unspecified: Secondary | ICD-10-CM | POA: Diagnosis not present

## 2020-07-29 DIAGNOSIS — H409 Unspecified glaucoma: Secondary | ICD-10-CM | POA: Diagnosis not present

## 2020-07-29 DIAGNOSIS — K219 Gastro-esophageal reflux disease without esophagitis: Secondary | ICD-10-CM | POA: Diagnosis not present

## 2020-07-29 DIAGNOSIS — E782 Mixed hyperlipidemia: Secondary | ICD-10-CM | POA: Diagnosis not present

## 2020-07-29 DIAGNOSIS — I5032 Chronic diastolic (congestive) heart failure: Secondary | ICD-10-CM | POA: Diagnosis not present

## 2020-07-29 DIAGNOSIS — F329 Major depressive disorder, single episode, unspecified: Secondary | ICD-10-CM | POA: Diagnosis not present

## 2020-07-29 DIAGNOSIS — E1122 Type 2 diabetes mellitus with diabetic chronic kidney disease: Secondary | ICD-10-CM | POA: Diagnosis not present

## 2020-07-29 DIAGNOSIS — I10 Essential (primary) hypertension: Secondary | ICD-10-CM | POA: Diagnosis not present

## 2020-08-18 DIAGNOSIS — C3412 Malignant neoplasm of upper lobe, left bronchus or lung: Secondary | ICD-10-CM | POA: Diagnosis not present

## 2020-08-21 DIAGNOSIS — C3412 Malignant neoplasm of upper lobe, left bronchus or lung: Secondary | ICD-10-CM | POA: Diagnosis not present

## 2020-08-25 DIAGNOSIS — C3412 Malignant neoplasm of upper lobe, left bronchus or lung: Secondary | ICD-10-CM | POA: Diagnosis not present

## 2020-09-01 ENCOUNTER — Institutional Professional Consult (permissible substitution): Payer: Self-pay | Admitting: Internal Medicine

## 2020-09-03 DIAGNOSIS — C3412 Malignant neoplasm of upper lobe, left bronchus or lung: Secondary | ICD-10-CM | POA: Diagnosis not present

## 2020-09-04 DIAGNOSIS — Z992 Dependence on renal dialysis: Secondary | ICD-10-CM | POA: Diagnosis not present

## 2020-09-04 DIAGNOSIS — T86819 Unspecified complication of lung transplant: Secondary | ICD-10-CM | POA: Diagnosis not present

## 2020-09-04 DIAGNOSIS — N186 End stage renal disease: Secondary | ICD-10-CM | POA: Diagnosis not present

## 2020-09-08 DIAGNOSIS — C3412 Malignant neoplasm of upper lobe, left bronchus or lung: Secondary | ICD-10-CM | POA: Diagnosis not present

## 2020-09-16 DIAGNOSIS — Z794 Long term (current) use of insulin: Secondary | ICD-10-CM | POA: Diagnosis not present

## 2020-09-16 DIAGNOSIS — T86819 Unspecified complication of lung transplant: Secondary | ICD-10-CM | POA: Diagnosis not present

## 2020-09-16 DIAGNOSIS — G4733 Obstructive sleep apnea (adult) (pediatric): Secondary | ICD-10-CM | POA: Diagnosis not present

## 2020-09-16 DIAGNOSIS — E119 Type 2 diabetes mellitus without complications: Secondary | ICD-10-CM | POA: Diagnosis not present

## 2020-09-16 DIAGNOSIS — Z7901 Long term (current) use of anticoagulants: Secondary | ICD-10-CM | POA: Diagnosis not present

## 2020-09-16 DIAGNOSIS — I10 Essential (primary) hypertension: Secondary | ICD-10-CM | POA: Diagnosis not present

## 2020-09-16 DIAGNOSIS — R5382 Chronic fatigue, unspecified: Secondary | ICD-10-CM | POA: Diagnosis not present

## 2020-09-16 DIAGNOSIS — A0472 Enterocolitis due to Clostridium difficile, not specified as recurrent: Secondary | ICD-10-CM | POA: Diagnosis not present

## 2020-09-16 DIAGNOSIS — I959 Hypotension, unspecified: Secondary | ICD-10-CM | POA: Diagnosis not present

## 2020-09-16 DIAGNOSIS — R195 Other fecal abnormalities: Secondary | ICD-10-CM | POA: Diagnosis not present

## 2020-09-16 DIAGNOSIS — Z20822 Contact with and (suspected) exposure to covid-19: Secondary | ICD-10-CM | POA: Diagnosis not present

## 2020-09-16 DIAGNOSIS — E1129 Type 2 diabetes mellitus with other diabetic kidney complication: Secondary | ICD-10-CM | POA: Diagnosis not present

## 2020-09-16 DIAGNOSIS — M6281 Muscle weakness (generalized): Secondary | ICD-10-CM | POA: Diagnosis not present

## 2020-09-16 DIAGNOSIS — Z452 Encounter for adjustment and management of vascular access device: Secondary | ICD-10-CM | POA: Diagnosis not present

## 2020-09-16 DIAGNOSIS — Z4824 Encounter for aftercare following lung transplant: Secondary | ICD-10-CM | POA: Diagnosis not present

## 2020-09-16 DIAGNOSIS — I120 Hypertensive chronic kidney disease with stage 5 chronic kidney disease or end stage renal disease: Secondary | ICD-10-CM | POA: Diagnosis not present

## 2020-09-16 DIAGNOSIS — X58XXXA Exposure to other specified factors, initial encounter: Secondary | ICD-10-CM | POA: Diagnosis not present

## 2020-09-16 DIAGNOSIS — T85698A Other mechanical complication of other specified internal prosthetic devices, implants and grafts, initial encounter: Secondary | ICD-10-CM | POA: Diagnosis not present

## 2020-09-16 DIAGNOSIS — J9621 Acute and chronic respiratory failure with hypoxia: Secondary | ICD-10-CM | POA: Diagnosis not present

## 2020-09-16 DIAGNOSIS — K219 Gastro-esophageal reflux disease without esophagitis: Secondary | ICD-10-CM | POA: Diagnosis not present

## 2020-09-16 DIAGNOSIS — R0600 Dyspnea, unspecified: Secondary | ICD-10-CM | POA: Diagnosis not present

## 2020-09-16 DIAGNOSIS — E782 Mixed hyperlipidemia: Secondary | ICD-10-CM | POA: Diagnosis not present

## 2020-09-16 DIAGNOSIS — Z942 Lung transplant status: Secondary | ICD-10-CM | POA: Diagnosis not present

## 2020-09-16 DIAGNOSIS — R531 Weakness: Secondary | ICD-10-CM | POA: Diagnosis not present

## 2020-09-16 DIAGNOSIS — R5381 Other malaise: Secondary | ICD-10-CM | POA: Diagnosis not present

## 2020-09-16 DIAGNOSIS — E43 Unspecified severe protein-calorie malnutrition: Secondary | ICD-10-CM | POA: Diagnosis not present

## 2020-09-16 DIAGNOSIS — M25512 Pain in left shoulder: Secondary | ICD-10-CM | POA: Diagnosis not present

## 2020-09-16 DIAGNOSIS — R7889 Finding of other specified substances, not normally found in blood: Secondary | ICD-10-CM | POA: Diagnosis not present

## 2020-09-16 DIAGNOSIS — I700 Atherosclerosis of aorta: Secondary | ICD-10-CM | POA: Diagnosis not present

## 2020-09-16 DIAGNOSIS — N186 End stage renal disease: Secondary | ICD-10-CM | POA: Diagnosis not present

## 2020-09-16 DIAGNOSIS — J90 Pleural effusion, not elsewhere classified: Secondary | ICD-10-CM | POA: Diagnosis not present

## 2020-09-16 DIAGNOSIS — W19XXXA Unspecified fall, initial encounter: Secondary | ICD-10-CM | POA: Diagnosis not present

## 2020-09-16 DIAGNOSIS — E1169 Type 2 diabetes mellitus with other specified complication: Secondary | ICD-10-CM | POA: Diagnosis not present

## 2020-09-16 DIAGNOSIS — Z7982 Long term (current) use of aspirin: Secondary | ICD-10-CM | POA: Diagnosis not present

## 2020-09-16 DIAGNOSIS — R1310 Dysphagia, unspecified: Secondary | ICD-10-CM | POA: Diagnosis not present

## 2020-09-16 DIAGNOSIS — K921 Melena: Secondary | ICD-10-CM | POA: Diagnosis not present

## 2020-09-16 DIAGNOSIS — A419 Sepsis, unspecified organism: Secondary | ICD-10-CM | POA: Diagnosis not present

## 2020-09-16 DIAGNOSIS — H409 Unspecified glaucoma: Secondary | ICD-10-CM | POA: Diagnosis not present

## 2020-09-16 DIAGNOSIS — Z789 Other specified health status: Secondary | ICD-10-CM | POA: Diagnosis not present

## 2020-09-16 DIAGNOSIS — D649 Anemia, unspecified: Secondary | ICD-10-CM | POA: Diagnosis not present

## 2020-09-16 DIAGNOSIS — Z992 Dependence on renal dialysis: Secondary | ICD-10-CM | POA: Diagnosis not present

## 2020-09-16 DIAGNOSIS — N2581 Secondary hyperparathyroidism of renal origin: Secondary | ICD-10-CM | POA: Diagnosis not present

## 2020-09-16 DIAGNOSIS — R918 Other nonspecific abnormal finding of lung field: Secondary | ICD-10-CM | POA: Diagnosis not present

## 2020-09-16 DIAGNOSIS — T859XXA Unspecified complication of internal prosthetic device, implant and graft, initial encounter: Secondary | ICD-10-CM | POA: Diagnosis not present

## 2020-09-16 DIAGNOSIS — R0902 Hypoxemia: Secondary | ICD-10-CM | POA: Diagnosis not present

## 2020-09-16 DIAGNOSIS — D631 Anemia in chronic kidney disease: Secondary | ICD-10-CM | POA: Diagnosis not present

## 2020-09-16 DIAGNOSIS — Z1159 Encounter for screening for other viral diseases: Secondary | ICD-10-CM | POA: Diagnosis not present

## 2020-09-16 DIAGNOSIS — R1312 Dysphagia, oropharyngeal phase: Secondary | ICD-10-CM | POA: Diagnosis not present

## 2020-09-16 DIAGNOSIS — Z87891 Personal history of nicotine dependence: Secondary | ICD-10-CM | POA: Diagnosis not present

## 2020-09-16 DIAGNOSIS — Z79899 Other long term (current) drug therapy: Secondary | ICD-10-CM | POA: Diagnosis not present

## 2020-09-16 DIAGNOSIS — Z23 Encounter for immunization: Secondary | ICD-10-CM | POA: Diagnosis not present

## 2020-09-16 DIAGNOSIS — Z743 Need for continuous supervision: Secondary | ICD-10-CM | POA: Diagnosis not present

## 2020-09-16 DIAGNOSIS — J849 Interstitial pulmonary disease, unspecified: Secondary | ICD-10-CM | POA: Diagnosis not present

## 2020-09-16 DIAGNOSIS — J8410 Pulmonary fibrosis, unspecified: Secondary | ICD-10-CM | POA: Diagnosis not present

## 2020-09-16 DIAGNOSIS — E1122 Type 2 diabetes mellitus with diabetic chronic kidney disease: Secondary | ICD-10-CM | POA: Diagnosis not present

## 2020-09-16 DIAGNOSIS — A403 Sepsis due to Streptococcus pneumoniae: Secondary | ICD-10-CM | POA: Diagnosis not present

## 2020-09-16 DIAGNOSIS — E785 Hyperlipidemia, unspecified: Secondary | ICD-10-CM | POA: Diagnosis not present

## 2020-09-16 DIAGNOSIS — E1165 Type 2 diabetes mellitus with hyperglycemia: Secondary | ICD-10-CM | POA: Diagnosis not present

## 2020-09-16 DIAGNOSIS — Z9225 Personal history of immunosupression therapy: Secondary | ICD-10-CM | POA: Diagnosis not present

## 2020-09-16 DIAGNOSIS — R799 Abnormal finding of blood chemistry, unspecified: Secondary | ICD-10-CM | POA: Diagnosis present

## 2020-09-16 DIAGNOSIS — R279 Unspecified lack of coordination: Secondary | ICD-10-CM | POA: Diagnosis not present

## 2020-09-16 DIAGNOSIS — Z9989 Dependence on other enabling machines and devices: Secondary | ICD-10-CM | POA: Diagnosis not present

## 2020-09-16 DIAGNOSIS — T82598A Other mechanical complication of other cardiac and vascular devices and implants, initial encounter: Secondary | ICD-10-CM | POA: Diagnosis not present

## 2020-09-16 DIAGNOSIS — D500 Iron deficiency anemia secondary to blood loss (chronic): Secondary | ICD-10-CM | POA: Diagnosis not present

## 2020-09-17 DIAGNOSIS — A0472 Enterocolitis due to Clostridium difficile, not specified as recurrent: Secondary | ICD-10-CM | POA: Diagnosis not present

## 2020-09-17 DIAGNOSIS — J9621 Acute and chronic respiratory failure with hypoxia: Secondary | ICD-10-CM | POA: Diagnosis not present

## 2020-09-17 DIAGNOSIS — Z942 Lung transplant status: Secondary | ICD-10-CM | POA: Diagnosis not present

## 2020-09-17 DIAGNOSIS — I10 Essential (primary) hypertension: Secondary | ICD-10-CM | POA: Diagnosis not present

## 2020-09-17 DIAGNOSIS — E785 Hyperlipidemia, unspecified: Secondary | ICD-10-CM | POA: Diagnosis not present

## 2020-09-18 ENCOUNTER — Emergency Department
Admission: EM | Admit: 2020-09-18 | Discharge: 2020-09-18 | Disposition: A | Discharge status: Home / Self Care | Payer: Medicare Other | Attending: Emergency Medicine | Admitting: Emergency Medicine

## 2020-09-18 ENCOUNTER — Other Ambulatory Visit: Payer: Self-pay

## 2020-09-18 ENCOUNTER — Emergency Department: Payer: Medicare Other

## 2020-09-18 DIAGNOSIS — X58XXXA Exposure to other specified factors, initial encounter: Secondary | ICD-10-CM | POA: Insufficient documentation

## 2020-09-18 DIAGNOSIS — E1122 Type 2 diabetes mellitus with diabetic chronic kidney disease: Secondary | ICD-10-CM | POA: Insufficient documentation

## 2020-09-18 DIAGNOSIS — N186 End stage renal disease: Secondary | ICD-10-CM | POA: Diagnosis not present

## 2020-09-18 DIAGNOSIS — E1169 Type 2 diabetes mellitus with other specified complication: Secondary | ICD-10-CM | POA: Insufficient documentation

## 2020-09-18 DIAGNOSIS — E782 Mixed hyperlipidemia: Secondary | ICD-10-CM | POA: Insufficient documentation

## 2020-09-18 DIAGNOSIS — Z992 Dependence on renal dialysis: Secondary | ICD-10-CM | POA: Diagnosis not present

## 2020-09-18 DIAGNOSIS — Z87891 Personal history of nicotine dependence: Secondary | ICD-10-CM | POA: Insufficient documentation

## 2020-09-18 DIAGNOSIS — R918 Other nonspecific abnormal finding of lung field: Secondary | ICD-10-CM | POA: Diagnosis not present

## 2020-09-18 DIAGNOSIS — Z794 Long term (current) use of insulin: Secondary | ICD-10-CM | POA: Diagnosis not present

## 2020-09-18 DIAGNOSIS — I120 Hypertensive chronic kidney disease with stage 5 chronic kidney disease or end stage renal disease: Secondary | ICD-10-CM | POA: Diagnosis not present

## 2020-09-18 DIAGNOSIS — N2581 Secondary hyperparathyroidism of renal origin: Secondary | ICD-10-CM | POA: Diagnosis not present

## 2020-09-18 DIAGNOSIS — Z79899 Other long term (current) drug therapy: Secondary | ICD-10-CM | POA: Insufficient documentation

## 2020-09-18 DIAGNOSIS — J8410 Pulmonary fibrosis, unspecified: Secondary | ICD-10-CM | POA: Diagnosis not present

## 2020-09-18 DIAGNOSIS — Z452 Encounter for adjustment and management of vascular access device: Secondary | ICD-10-CM | POA: Diagnosis not present

## 2020-09-18 DIAGNOSIS — D631 Anemia in chronic kidney disease: Secondary | ICD-10-CM | POA: Diagnosis not present

## 2020-09-18 DIAGNOSIS — Z789 Other specified health status: Secondary | ICD-10-CM

## 2020-09-18 DIAGNOSIS — T82598A Other mechanical complication of other cardiac and vascular devices and implants, initial encounter: Secondary | ICD-10-CM | POA: Insufficient documentation

## 2020-09-18 DIAGNOSIS — I700 Atherosclerosis of aorta: Secondary | ICD-10-CM | POA: Diagnosis not present

## 2020-09-18 MED ORDER — HEPARIN SOD (PORK) LOCK FLUSH 100 UNIT/ML IV SOLN: 500.0000 [IU] | INTRAVENOUS | Status: DC | PRN

## 2020-09-18 MED ORDER — PIPERACILLIN-TAZOBACTAM 3.375 G IVPB 30 MIN
3.3750 g | Freq: Once | INTRAVENOUS | Status: AC
  Administered 2020-09-18: 3.375 g via INTRAVENOUS
  Filled 2020-09-18: qty 50

## 2020-09-18 MED ORDER — SODIUM CHLORIDE 0.9% FLUSH: 10.0000 mL | INTRAVENOUS | Status: DC | PRN

## 2020-09-18 NOTE — Discharge Instructions (Addendum)
Patient's tunneled right IJ catheter flushed without difficulty in the ED.  Chest x-ray confirmed placement.  He received his nightly dose of IV Zosyn in the emergency department.

## 2020-09-18 NOTE — ED Provider Notes (Signed)
[facility] Emergency Department Provider Note  ____________________________________________   Event Date/Time   First MD Initiated Contact with Patient 09/18/20 0154     (approximate)  I have reviewed the triage vital signs and the nursing notes.   HISTORY  Chief Complaint Vascular Access Problem    HPI [name] is a 70 y.o. male with history of end-stage renal disease on hemodialysis, hypertension, pulmonary fibrosis, previous lung transplant, diabetes who presents to the emergency department with EMS from [facility] as his right tunneled IJ catheter would not flush tonight.  Patient has no other complaints.  Spoke with Mia at the nursing facility who states he was supposed to receive a dose of Zosyn tonight which they were unable to give him as they were not able to flush any of his ports.        Past Medical History:  Diagnosis Date  . Aortic valve disorders   . Benign neoplasm of colon   . Degeneration of intervertebral disc, site unspecified   . Diabetes mellitus without complication ([place])   . 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 Esophageal reflux   . ESRD (end stage renal disease) on dialysis ([place])   . Essential hypertension   . Obstructive sleep apnea (adult) (pediatric)   . Osteoarthrosis, unspecified whether generalized or localized, unspecified site   . Other and unspecified hyperlipidemia   . Pneumonia    interstitial pneumonia  . Pulmonary fibrosis ([place])   . Renal disorder   . Respiratory failure with hypoxia ([place]) 12/2015  . Transplanted, lung ([place])   . Unspecified essential hypertension     Patient Active Problem List   Diagnosis Date Noted  . Exercise hypoxemia   . Pneumonia 07/17/2020  . Lung mass 07/17/2020  . Acute bronchiolitis due to human metapneumovirus 04/25/2020  . Sepsis with acute organ dysfunction ([place]) 01/08/2020  . Lactic acidosis 01/08/2020  . Mixed diabetic  hyperlipidemia associated with type 2 diabetes mellitus ([place]) 01/08/2020  . Hepatic cirrhosis ([place]) 01/08/2020  . Uncontrolled type 2 diabetes mellitus with hyperglycemia, with long-term current use of insulin ([place]) 01/08/2020  . IV infiltrate, initial encounter 01/08/2020  . Acute diarrhea 01/08/2020  . Acute metabolic encephalopathy 22/97/9892  . 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 AV fistula infection, initial encounter ([place]) 01/14/2019  . Immunocompromised state ([place]) 01/14/2019  . Wound infection 01/13/2019  . Sepsis ([place])   . CKD (chronic kidney disease)   . Generalized weakness 12/30/2015  . End-stage renal disease on hemodialysis ([place])   . 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[place])   . Acute respiratory failure with hypoxemia ([place]) 04/18/2015  . Septic shock ([place]) 04/18/2015  . Acute encephalopathy 04/18/2015  . Acute respiratory failure with hypoxia ([place]) 04/18/2015  . Cardiac arrest ([place])   . HCAP (healthcare-associated pneumonia)   . Elevated rheumatoid factor 05/09/2014  . Essential hypertension 05/09/2014  . ILD (interstitial lung disease) ([place]) 05/09/2014  . Obstructive apnea 05/09/2014  . Lung nodule, solitary 04/18/2014  . Awaiting organ transplant 04/18/2014  . Acute on chronic respiratory failure with hypoxia ([place]) 08/15/2013  . Edema 07/05/2013  . Chronic respiratory failure ([place]) 03/03/2013  . Diabetes mellitus with complication ([place]) 11/94/1740  . Acute sinusitis 05/04/2011  . Cough 11/10/2010  . 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 Obstructive sleep apnea 10/09/2008  . GERD without esophagitis 10/09/2008  . HIATAL HERNIA 10/09/2008  . OSTEOARTHRITIS 10/09/2008    Past Surgical History:  Procedure Laterality  Date  . COLONOSCOPY WITH PROPOFOL N/A 11/07/2017   Procedure: COLONOSCOPY WITH PROPOFOL;  Surgeon: Ronnette Juniper, MD;  Location: [facility]COPY;  Service: Gastroenterology;  Laterality: N/A;  . LIGATION OF ARTERIOVENOUS  FISTULA Left  01/14/2019   Procedure: LIGATION OF ARTERIOVENOUS  FISTULA;  Surgeon: Marty Heck, MD;  Location: [facility];  Service: Vascular;  Laterality: Left;  . LUNG BIOPSY  2010  . LUNG TRANSPLANT, SINGLE Right   . POLYPECTOMY  11/07/2017   Procedure: POLYPECTOMY;  Surgeon: Ronnette Juniper, MD;  Location: [facility];  Service: Gastroenterology;;    Prior to Admission medications   Medication Sig Start Date End Date Taking? Authorizing Provider  ACCU-CHEK FASTCLIX LANCETS MISC "As directed up to 4 times daily" 12/16/15   [provider]  acetaminophen (TYLENOL) 500 MG tablet Take 500-1,000 mg by mouth every 6 (six) hours as needed for moderate pain or headache.     [provider]  albuterol (VENTOLIN HFA) 108 (90 Base) MCG/ACT inhaler Inhale 1-2 puffs into the lungs daily as needed for wheezing or shortness of breath. 05/12/20   [provider]  Alpha Lipoic Acid 200 MG CAPS Take 200-400 mg by mouth See admin instructions. On Monday, Wednesday, Friday (dialysis days): take 2 capsules (400 mg) by mouth with breakfast and 1 capsule (200 mg) with supper; on Sunday, Tuesday, Thursday, Saturday (non-dialysis days): take 1 capsule (200 mg) three times daily with meals    [provider]  amoxicillin-clavulanate (AUGMENTIN) 875-125 MG tablet TAKE 1 TABLET BY MOUTH TWO TIMES DAILY FOR 7 DAYS. 07/21/20 07/21/21  [name], MD  aspirin 81 MG tablet Take 81 mg by mouth daily with breakfast.    [provider]  azaTHIOprine (IMURAN) 50 MG tablet Take 50 mg by mouth See admin instructions. Take one tablet (50 mg) by mouth on Monday, Wednesday, Friday after dialysis    [provider]  calcium acetate (PHOSLO) 667 MG capsule Take 667 mg by mouth 3 (three) times daily before meals.     [provider]  cycloSPORINE modified (NEORAL) 25 MG capsule Take 75-100 mg by mouth See admin instructions. Take 3 capsule (75 mg) by mouth daily with breakfast and 4  capsules (100 mg) at bedtime    [provider]  dorzolamidel-timolol (COSOPT PF) 22.3-6.8 MG/ML SOLN ophthalmic solution Place 1 drop into both eyes 2 (two) times daily.    [provider]  fluorometholone (FML) 0.1 % ophthalmic suspension Place 1 drop into the right eye 2 (two) times daily. 04/11/20   [provider]  fluticasone (FLONASE) 50 MCG/ACT nasal spray Place 2 sprays into the nose daily. Patient taking differently: Place 2 sprays into the nose daily as needed for allergies. 03/05/13   Wenda Low, MD  gabapentin (NEURONTIN) 100 MG capsule Take 100 mg by mouth at bedtime. 03/25/20   [provider]  Immune Globulin 10% (PRIVIGEN) 20 GM/200ML SOLN Inject 0.5 g/kg into the vein every 3 (three) months.     [provider]  insulin regular (NOVOLIN R) 100 units/mL injection Inject 5-9 Units into the skin See admin instructions. Inject 5 units subcutaneously with breakfast and 6 units with lunch and supper; PLUS adjustment per sliding scale: CBG [phone] add 1 unit, 261-360 add 2 units, 361-461 add 3 units    [provider]  midodrine (PROAMATINE) 5 MG tablet Take 5-10 mg by mouth See admin instructions. On Monday, Wednesday, Friday(dialysis days): take 1 tablet (5 mg) by mouth before  dialysis, take 1 tablet (5 mg) after dialysis as needed for SBP >90. On Sunday, Tuesday, Thursday, Saturday (non-dialysis days): take 1 tablet (5 mg) by mouth 3 times daily as needed for SBP >90. 11/18/19   [provider]  Multiple Vitamin (MULTIVITAMIN WITH MINERALS) TABS tablet Take 1 tablet by mouth daily. Centrum for Men 50 plus: on Monday, Wednesday, Friday (dialysis days): take one tablet daily with breakfast; on Sunday, Tuesday, Thursday, Saturday: take one tablet daily with supper    [provider]  omeprazole (PRILOSEC) 40 MG capsule Take 40 mg by mouth 2 (two) times daily with a meal.  11/12/18   [provider]  polyvinyl  alcohol-povidone (HYPOTEARS) 1.4-0.6 % ophthalmic solution Place 1 drop into both eyes at bedtime.    [provider]  pravastatin (PRAVACHOL) 10 MG tablet Take 10 mg by mouth at bedtime. 03/16/20   [provider]  sulfamethoxazole-trimethoprim (BACTRIM,SEPTRA) 400-80 MG tablet Take 1 tablet by mouth See admin instructions. On Monday, Wednesday, Friday (dialysis days): take one tablet by mouth daily with supper 02/27/17   [provider]  tizanidine (ZANAFLEX) 2 MG capsule Take 1 capsule (2 mg total) by mouth at bedtime as needed for muscle spasms. Patient not taking: Reported on 07/18/2020 07/06/20   Hans Eden, NP  valGANciclovir (VALCYTE) 450 MG tablet Take 450 mg by mouth See admin instructions. Take one tablet (450 mg) by mouth on Monday, Wednesday, and Friday after dialysis 03/26/15   [provider]  vancomycin (VANCOCIN) 125 MG capsule TAKE 1 CAPSULE (125 MG TOTAL) BY MOUTH FOUR TIMES DAILY FOR 14 DAYS. THROUGH 05/05/2020 04/27/20 04/27/21  Arrien, Jimmy Picket, MD  vitamin B-6 (PYRIDOXINE) 25 MG tablet Take 25 mg by mouth daily with breakfast.     [provider]    Allergies Levofloxacin and Nsaids  Family History  Problem Relation Age of Onset  . Pancreatic cancer Brother   . Heart disease Father     Social History Social History   Tobacco Use  . Smoking status: Former Smoker    Packs/day: 0.30    Years: 20.00    Pack years: 6.00    Types: Cigarettes    Quit date: 06/06/2002    Years since quitting: 18.2  . Smokeless tobacco: Never Used  Vaping Use  . Vaping Use: Never used  Substance Use Topics  . Alcohol use: No    Alcohol/week: 0.0 standard drinks  . Drug use: No    Review of Systems Constitutional: No fever. Eyes: No visual changes. ENT: No sore throat. Cardiovascular: Denies chest pain. Respiratory: Denies shortness of breath. Gastrointestinal: No nausea, vomiting, diarrhea. Genitourinary: Negative for  dysuria. Musculoskeletal: Negative for back pain. Skin: Negative for rash. Neurological: Negative for focal weakness or numbness.  ____________________________________________   PHYSICAL EXAM:  VITAL SIGNS: ED Triage Vitals  Enc Vitals Group     BP 09/18/20 0043 (!) 158/73     Pulse Rate 09/18/20 0043 82     Resp 09/18/20 0043 18     Temp 09/18/20 0043 98 F (36.7 C)     Temp Source 09/18/20 0043 Oral     SpO2 09/18/20 0043 94 %     Weight 09/18/20 0042 128 lb (58.1 kg)     Height 09/18/20 0042 5\' 3"  (1.6 m)     Head Circumference --      Peak Flow --      Pain Score 09/18/20 0042 0     Pain Loc --  Pain Edu? --      Excl. in [place]? --    CONSTITUTIONAL: Alert and oriented and responds appropriately to questions.  Elderly, chronically ill-appearing, afebrile, no distress HEAD: Normocephalic EYES: Conjunctivae clear, pupils appear equal, EOM appear intact ENT: normal nose; moist mucous membranes NECK: Supple, normal ROM CARD: RRR; S1 and S2 appreciated; no murmurs, no clicks, no rubs, no gallops CHEST: Patient has a tunneled right IJ catheter noted to the right chest wall without surrounding redness, warmth, bleeding, bruising or tenderness RESP: Normal chest excursion without splinting or tachypnea; breath sounds clear and equal bilaterally; no wheezes, no rhonchi, no rales, no hypoxia or respiratory distress, speaking full sentences ABD/GI: Normal bowel sounds; non-distended; soft, non-tender, no rebound, no guarding, no peritoneal signs, no hepatosplenomegaly BACK: The back appears normal EXT: Normal ROM in all joints; no deformity noted, no edema; no cyanosis SKIN: Normal color for age and race; warm; no rash on exposed skin NEURO: Moves all extremities equally PSYCH: The patient's mood and manner are appropriate.  ____________________________________________   LABS (all labs ordered are listed, but only abnormal results are displayed)  Labs Reviewed - No data to  display ____________________________________________  EKG  none ____________________________________________  RADIOLOGY I, Kerri Asche, personally viewed and evaluated these images (plain radiographs) as part of my medical decision making, as well as reviewing the written report by the radiologist.  ED MD interpretation: Tunneled right IJ in appropriate position and terminates at the right atrium.  Official radiology report(s): DG Chest Portable 1 View  Result Date: 09/18/2020 CLINICAL DATA:  Line placement, Med Port is clogged EXAM: PORTABLE CHEST 1 VIEW COMPARISON:  Radiograph 07/19/2020, CT 07/18/2020 FINDINGS: Tunneled right IJ approach central venous catheter tip terminates at the right atrium. Multiple mediastinal and surgical clips are noted. Vascular stenting noted within the region of the left brachiocephalic vein and left axillary vein with some slight irregularity of the mid brachiocephalic stent similar to prior. Atherosclerotic calcification of the aorta. Stable cardiomediastinal contours. Dense reticular opacities again seen in the left lung compatible with fibrotic changes on comparison imaging. Slightly increasing prominence of a left apical mass though some of this may be attributable to differences in technique. IMPRESSION: 1. Tunneled right IJ approach central venous catheter tip terminates at the right atrium. 2. Vascular stenting along the left brachiocephalic vein with some irregularity of the mid brachiocephalic stent similar to prior. Additional left axillary vascular stent as well. 3. Slightly increasing prominence of a left apical mass though some of this may be attributable to differences in technique. Background of fibrosis throughout the left lung. More mild changes in the right lung. 4. Stable cardiomediastinal contours. 5.  Aortic Atherosclerosis (ICD10-I70.0). Electronically Signed   By: Lovena Le M.D.   On: 09/18/2020 02:55     ____________________________________________   PROCEDURES  Procedure(s) performed (including Critical Care):  Procedures    ____________________________________________   INITIAL IMPRESSION / ASSESSMENT AND PLAN / ED COURSE  As part of my medical decision making, I reviewed the following data within the [place] notes reviewed and incorporated, Old chart reviewed, Radiograph reviewed  and Notes from prior ED visits         Patient here after his tunneled IJ catheter would not flush and he was not able to receive his antibiotics at the nursing facility.  No other acute complaints.  We were able to flush his IJ here without significant difficulty.  One of his ports is slightly more difficult than  others to flush and does not draw back blood but his other ports do draw blood back and are easily flushed.  Chest x-ray confirms placement.  He has no other acute complaints.  Will give his dose of IV Zosyn here in the ED prior to discharge back to the nursing facility.  At this time, I do not feel there is any life-threatening condition present. I have reviewed, interpreted and discussed all results (EKG, imaging, lab, urine as appropriate) and exam findings with patient/family. I have reviewed nursing notes and appropriate previous records.  I feel the patient is safe to be discharged home without further emergent workup and can continue workup as an outpatient as needed. Discussed usual and customary return precautions. Patient/family verbalize understanding and are comfortable with this plan.  Outpatient follow-up has been provided as needed. All questions have been answered.  ____________________________________________   FINAL CLINICAL IMPRESSION(S) / ED DIAGNOSES  Final diagnoses:  Problem with vascular access     ED Discharge Orders    None      *Please note:  INEZ ROSATO was evaluated in Emergency Department on 09/18/2020 for the symptoms  described in the history of present illness. He was evaluated in the context of the global COVID-19 pandemic, which necessitated consideration that the patient might be at risk for infection with the SARS-CoV-2 virus that causes COVID-19. Institutional protocols and algorithms that pertain to the evaluation of patients at risk for COVID-19 are in a state of rapid change based on information released by regulatory bodies including the CDC and federal and state organizations. These policies and algorithms were followed during the patient's care in the ED.  Some ED evaluations and interventions may be delayed as a result of limited staffing during and the pandemic.*   Note:  This document was prepared using Dragon voice recognition software and may include unintentional dictation errors.   Kassey Laforest, Delice Bison, DO 09/18/20 [phone]

## 2020-09-18 NOTE — ED Notes (Addendum)
Dr. Leonides Schanz at bedside. Right subclavian port flushed with no difficulties. Blood return from proximal/red port only.

## 2020-09-18 NOTE — ED Triage Notes (Signed)
Pt to ED via EMS from [facility]. Per ems pts med port is clogged, pt was unable to receive his dose on zoysn at midnight so the facility called 911. Pts port is right subclavian. Pt denies and pain.

## 2020-09-21 DIAGNOSIS — N186 End stage renal disease: Secondary | ICD-10-CM | POA: Diagnosis not present

## 2020-09-21 DIAGNOSIS — N2581 Secondary hyperparathyroidism of renal origin: Secondary | ICD-10-CM | POA: Diagnosis not present

## 2020-09-21 DIAGNOSIS — D631 Anemia in chronic kidney disease: Secondary | ICD-10-CM | POA: Diagnosis not present

## 2020-09-21 DIAGNOSIS — Z992 Dependence on renal dialysis: Secondary | ICD-10-CM | POA: Diagnosis not present

## 2020-09-23 DIAGNOSIS — Z992 Dependence on renal dialysis: Secondary | ICD-10-CM | POA: Diagnosis not present

## 2020-09-23 DIAGNOSIS — N2581 Secondary hyperparathyroidism of renal origin: Secondary | ICD-10-CM | POA: Diagnosis not present

## 2020-09-23 DIAGNOSIS — D631 Anemia in chronic kidney disease: Secondary | ICD-10-CM | POA: Diagnosis not present

## 2020-09-23 DIAGNOSIS — N186 End stage renal disease: Secondary | ICD-10-CM | POA: Diagnosis not present

## 2020-09-24 DIAGNOSIS — R5382 Chronic fatigue, unspecified: Secondary | ICD-10-CM | POA: Diagnosis not present

## 2020-09-24 DIAGNOSIS — W19XXXA Unspecified fall, initial encounter: Secondary | ICD-10-CM | POA: Diagnosis not present

## 2020-09-25 DIAGNOSIS — D631 Anemia in chronic kidney disease: Secondary | ICD-10-CM | POA: Diagnosis not present

## 2020-09-25 DIAGNOSIS — Z992 Dependence on renal dialysis: Secondary | ICD-10-CM | POA: Diagnosis not present

## 2020-09-25 DIAGNOSIS — N2581 Secondary hyperparathyroidism of renal origin: Secondary | ICD-10-CM | POA: Diagnosis not present

## 2020-09-25 DIAGNOSIS — N186 End stage renal disease: Secondary | ICD-10-CM | POA: Diagnosis not present

## 2020-09-26 ENCOUNTER — Observation Stay
Admission: EM | Admit: 2020-09-26 | Discharge: 2020-09-28 | Disposition: A | Discharge status: Home / Self Care | Payer: Medicare Other | Attending: Internal Medicine | Admitting: Internal Medicine

## 2020-09-26 ENCOUNTER — Other Ambulatory Visit: Payer: Self-pay

## 2020-09-26 DIAGNOSIS — N186 End stage renal disease: Secondary | ICD-10-CM | POA: Diagnosis not present

## 2020-09-26 DIAGNOSIS — Z992 Dependence on renal dialysis: Secondary | ICD-10-CM | POA: Insufficient documentation

## 2020-09-26 DIAGNOSIS — R531 Weakness: Secondary | ICD-10-CM

## 2020-09-26 DIAGNOSIS — Z7982 Long term (current) use of aspirin: Secondary | ICD-10-CM | POA: Insufficient documentation

## 2020-09-26 DIAGNOSIS — Z79899 Other long term (current) drug therapy: Secondary | ICD-10-CM | POA: Insufficient documentation

## 2020-09-26 DIAGNOSIS — Z942 Lung transplant status: Secondary | ICD-10-CM

## 2020-09-26 DIAGNOSIS — R195 Other fecal abnormalities: Secondary | ICD-10-CM | POA: Diagnosis not present

## 2020-09-26 DIAGNOSIS — R918 Other nonspecific abnormal finding of lung field: Secondary | ICD-10-CM | POA: Diagnosis present

## 2020-09-26 DIAGNOSIS — D649 Anemia, unspecified: Secondary | ICD-10-CM | POA: Diagnosis not present

## 2020-09-26 DIAGNOSIS — G4733 Obstructive sleep apnea (adult) (pediatric): Secondary | ICD-10-CM | POA: Diagnosis present

## 2020-09-26 DIAGNOSIS — Z20822 Contact with and (suspected) exposure to covid-19: Secondary | ICD-10-CM | POA: Insufficient documentation

## 2020-09-26 DIAGNOSIS — Z87891 Personal history of nicotine dependence: Secondary | ICD-10-CM | POA: Insufficient documentation

## 2020-09-26 DIAGNOSIS — K921 Melena: Secondary | ICD-10-CM | POA: Diagnosis not present

## 2020-09-26 DIAGNOSIS — R799 Abnormal finding of blood chemistry, unspecified: Secondary | ICD-10-CM | POA: Diagnosis present

## 2020-09-26 DIAGNOSIS — I120 Hypertensive chronic kidney disease with stage 5 chronic kidney disease or end stage renal disease: Secondary | ICD-10-CM | POA: Insufficient documentation

## 2020-09-26 DIAGNOSIS — E119 Type 2 diabetes mellitus without complications: Secondary | ICD-10-CM | POA: Diagnosis not present

## 2020-09-26 DIAGNOSIS — Z794 Long term (current) use of insulin: Secondary | ICD-10-CM | POA: Diagnosis not present

## 2020-09-26 DIAGNOSIS — J849 Interstitial pulmonary disease, unspecified: Secondary | ICD-10-CM | POA: Diagnosis present

## 2020-09-26 DIAGNOSIS — K7460 Unspecified cirrhosis of liver: Secondary | ICD-10-CM | POA: Diagnosis present

## 2020-09-26 DIAGNOSIS — I10 Essential (primary) hypertension: Secondary | ICD-10-CM | POA: Diagnosis present

## 2020-09-26 DIAGNOSIS — K219 Gastro-esophageal reflux disease without esophagitis: Secondary | ICD-10-CM | POA: Diagnosis present

## 2020-09-26 LAB — CBC
HCT: 25 % — ABNORMAL LOW (ref 39.0–52.0)
Hemoglobin: 8 g/dL — ABNORMAL LOW (ref 13.0–17.0)
MCH: 32.8 pg (ref 26.0–34.0)
MCHC: 32 g/dL (ref 30.0–36.0)
MCV: 102.5 fL — ABNORMAL HIGH (ref 80.0–100.0)
Platelets: 169 10*3/uL (ref 150–400)
RBC: 2.44 MIL/uL — ABNORMAL LOW (ref 4.22–5.81)
RDW: 21.7 % — ABNORMAL HIGH (ref 11.5–15.5)
WBC: 7.1 10*3/uL (ref 4.0–10.5)
nRBC: 0 % (ref 0.0–0.2)

## 2020-09-26 LAB — IRON AND TIBC
Iron: 44 ug/dL — ABNORMAL LOW (ref 45–182)
Saturation Ratios: 22 % (ref 17.9–39.5)
TIBC: 200 ug/dL — ABNORMAL LOW (ref 250–450)
UIBC: 156 ug/dL

## 2020-09-26 LAB — RETICULOCYTES
Immature Retic Fract: 24.1 % — ABNORMAL HIGH (ref 2.3–15.9)
RBC.: 2.04 MIL/uL — ABNORMAL LOW (ref 4.22–5.81)
Retic Count, Absolute: 88.3 10*3/uL (ref 19.0–186.0)
Retic Ct Pct: 4.3 % — ABNORMAL HIGH (ref 0.4–3.1)

## 2020-09-26 LAB — COMPREHENSIVE METABOLIC PANEL
ALT: 43 U/L (ref 0–44)
AST: 70 U/L — ABNORMAL HIGH (ref 15–41)
Albumin: 2 g/dL — ABNORMAL LOW (ref 3.5–5.0)
Alkaline Phosphatase: 669 U/L — ABNORMAL HIGH (ref 38–126)
Anion gap: 13 (ref 5–15)
BUN: 27 mg/dL — ABNORMAL HIGH (ref 8–23)
CO2: 30 mmol/L (ref 22–32)
Calcium: 8.6 mg/dL — ABNORMAL LOW (ref 8.9–10.3)
Chloride: 95 mmol/L — ABNORMAL LOW (ref 98–111)
Creatinine, Ser: 3.68 mg/dL — ABNORMAL HIGH (ref 0.61–1.24)
GFR, Estimated: 17 mL/min — ABNORMAL LOW (ref >60)
Glucose, Bld: 252 mg/dL — ABNORMAL HIGH (ref 70–99)
Potassium: 3.2 mmol/L — ABNORMAL LOW (ref 3.5–5.1)
Sodium: 138 mmol/L (ref 135–145)
Total Bilirubin: 1.8 mg/dL — ABNORMAL HIGH (ref 0.3–1.2)
Total Protein: 6.5 g/dL (ref 6.5–8.1)

## 2020-09-26 LAB — CBC WITH DIFFERENTIAL/PLATELET
Abs Immature Granulocytes: 0.13 10*3/uL — ABNORMAL HIGH (ref 0.00–0.07)
Basophils Absolute: 0 10*3/uL (ref 0.0–0.1)
Basophils Relative: 0 %
Eosinophils Absolute: 0 10*3/uL (ref 0.0–0.5)
Eosinophils Relative: 0 %
HCT: 20.9 % — ABNORMAL LOW (ref 39.0–52.0)
Hemoglobin: 6.6 g/dL — ABNORMAL LOW (ref 13.0–17.0)
Immature Granulocytes: 2 %
Lymphocytes Relative: 3 %
Lymphs Abs: 0.2 10*3/uL — ABNORMAL LOW (ref 0.7–4.0)
MCH: 34 pg (ref 26.0–34.0)
MCHC: 31.6 g/dL (ref 30.0–36.0)
MCV: 107.7 fL — ABNORMAL HIGH (ref 80.0–100.0)
Monocytes Absolute: 1 10*3/uL (ref 0.1–1.0)
Monocytes Relative: 13 %
Neutro Abs: 5.9 10*3/uL (ref 1.7–7.7)
Neutrophils Relative %: 82 %
Platelets: 172 10*3/uL (ref 150–400)
RBC: 1.94 MIL/uL — ABNORMAL LOW (ref 4.22–5.81)
RDW: 21.8 % — ABNORMAL HIGH (ref 11.5–15.5)
WBC: 7.2 10*3/uL (ref 4.0–10.5)
nRBC: 0 % (ref 0.0–0.2)

## 2020-09-26 LAB — RESP PANEL BY RT-PCR (FLU A&B, COVID) ARPGX2
Influenza A by PCR: NEGATIVE
Influenza B by PCR: NEGATIVE
SARS Coronavirus 2 by RT PCR: NEGATIVE

## 2020-09-26 LAB — LACTATE DEHYDROGENASE: LDH: 189 U/L (ref 98–192)

## 2020-09-26 LAB — APTT: aPTT: 61 seconds — ABNORMAL HIGH (ref 24–36)

## 2020-09-26 LAB — PROTIME-INR
INR: 1 (ref 0.8–1.2)
Prothrombin Time: 13.2 seconds (ref 11.4–15.2)

## 2020-09-26 LAB — FERRITIN: Ferritin: 2312 ng/mL — ABNORMAL HIGH (ref 24–336)

## 2020-09-26 MED ORDER — ACETAMINOPHEN 500 MG PO TABS
1000.0000 mg | ORAL_TABLET | Freq: Four times a day (QID) | ORAL | Status: DC | PRN
  Administered 2020-09-27: 1000 mg via ORAL
  Filled 2020-09-26: qty 2

## 2020-09-26 MED ORDER — VALGANCICLOVIR HCL 50 MG/ML PO SOLR
450.0000 mg | ORAL | Status: DC
  Filled 2020-09-26: qty 9

## 2020-09-26 MED ORDER — AMOXICILLIN-POT CLAVULANATE 875-125 MG PO TABS: 1.0000 | ORAL_TABLET | Freq: Two times a day (BID) | ORAL | Status: DC

## 2020-09-26 MED ORDER — FLUOROMETHOLONE 0.1 % OP SUSP: 1.0000 [drp] | Freq: Two times a day (BID) | OPHTHALMIC | Status: DC

## 2020-09-26 MED ORDER — ONDANSETRON HCL 4 MG/2ML IJ SOLN: 4.0000 mg | Freq: Four times a day (QID) | INTRAMUSCULAR | Status: DC | PRN

## 2020-09-26 MED ORDER — FLUTICASONE PROPIONATE 50 MCG/ACT NA SUSP
2.0000 | Freq: Every day | NASAL | Status: DC | PRN
  Filled 2020-09-26: qty 16

## 2020-09-26 MED ORDER — PANTOPRAZOLE SODIUM 40 MG IV SOLR
40.0000 mg | Freq: Once | INTRAVENOUS | Status: AC
  Administered 2020-09-27: 01:00:00 40 mg via INTRAVENOUS
  Filled 2020-09-26: qty 40

## 2020-09-26 MED ORDER — GABAPENTIN 100 MG PO CAPS: 100.0000 mg | ORAL_CAPSULE | Freq: Every day | ORAL | Status: DC

## 2020-09-26 MED ORDER — VITAMIN B-6 50 MG PO TABS
25.0000 mg | ORAL_TABLET | Freq: Every day | ORAL | Status: DC
  Administered 2020-09-27 – 2020-09-28 (×2): 25 mg via ORAL
  Filled 2020-09-26 (×3): qty 0.5

## 2020-09-26 MED ORDER — CYCLOSPORINE MODIFIED (NEORAL) 25 MG PO CAPS
100.0000 mg | ORAL_CAPSULE | Freq: Two times a day (BID) | ORAL | Status: DC
  Administered 2020-09-27 – 2020-09-28 (×3): 100 mg via ORAL
  Filled 2020-09-26 (×4): qty 4

## 2020-09-26 MED ORDER — ONDANSETRON HCL 4 MG PO TABS: 4.0000 mg | ORAL_TABLET | Freq: Four times a day (QID) | ORAL | Status: DC | PRN

## 2020-09-26 MED ORDER — CALCIUM ACETATE (PHOS BINDER) 667 MG PO CAPS: 667.0000 mg | ORAL_CAPSULE | Freq: Three times a day (TID) | ORAL | Status: DC

## 2020-09-26 MED ORDER — AZATHIOPRINE 50 MG PO TABS: 50.0000 mg | ORAL_TABLET | ORAL | Status: DC

## 2020-09-26 MED ORDER — POLYVINYL ALCOHOL-POVIDONE 1.4-0.6 % OP SOLN: 1.0000 [drp] | Freq: Every day | OPHTHALMIC | Status: DC

## 2020-09-26 MED ORDER — ACETAMINOPHEN 650 MG RE SUPP: 650.0000 mg | Freq: Four times a day (QID) | RECTAL | Status: DC | PRN

## 2020-09-26 MED ORDER — SODIUM CHLORIDE 0.9 % IV SOLN
10.0000 mL/h | Freq: Once | INTRAVENOUS | Status: AC
  Administered 2020-09-26: 10 mL/h via INTRAVENOUS

## 2020-09-26 MED ORDER — DORZOLAMIDE HCL-TIMOLOL MAL PF 22.3-6.8 MG/ML OP SOLN: 1.0000 [drp] | Freq: Two times a day (BID) | OPHTHALMIC | Status: DC

## 2020-09-26 MED ORDER — ALBUTEROL SULFATE HFA 108 (90 BASE) MCG/ACT IN AERS
1.0000 | INHALATION_SPRAY | Freq: Every day | RESPIRATORY_TRACT | Status: DC | PRN
  Filled 2020-09-26: qty 6.7

## 2020-09-26 MED ORDER — ADULT MULTIVITAMIN LIQUID CH
10.0000 mL | Freq: Every day | ORAL | Status: DC
  Administered 2020-09-27: 10 mL via ORAL
  Filled 2020-09-26 (×2): qty 15

## 2020-09-26 MED ORDER — SULFAMETHOXAZOLE-TRIMETHOPRIM 400-80 MG PO TABS: 1.0000 | ORAL_TABLET | ORAL | Status: DC

## 2020-09-26 MED ORDER — PRAVASTATIN SODIUM 20 MG PO TABS
10.0000 mg | ORAL_TABLET | Freq: Every day | ORAL | Status: DC
  Administered 2020-09-27: 22:00:00 10 mg via ORAL
  Filled 2020-09-26 (×2): qty 1

## 2020-09-26 NOTE — ED Notes (Signed)
Patient brought to the unit at this time. One unit of RBCs still running. Patient stable, A&Ox4, and in no acute distress. Removed as Primary RN.

## 2020-09-26 NOTE — ED Triage Notes (Signed)
Patient was brought in via EMS from [facility] for a hemoglobin of 6.6. Patient is alert and oriented x4. Pt states this happened last week.

## 2020-09-26 NOTE — Plan of Care (Signed)
  Problem: Health Behavior/Discharge Planning: Goal: Ability to manage health-related needs will improve Outcome: Progressing   Problem: Clinical Measurements: Goal: Ability to maintain clinical measurements within normal limits will improve Outcome: Progressing Goal: Will remain free from infection Outcome: Progressing Goal: Diagnostic test results will improve Outcome: Progressing Goal: Respiratory complications will improve Outcome: Progressing Goal: Cardiovascular complication will be avoided Outcome: Progressing   Problem: Activity: Goal: Risk for activity intolerance will decrease Outcome: Progressing  Patient brought up from ED, Blood was done infusing on arrival, manually had to flush. Patient has a PEG tube and states that they do not use it at the facility he stays at. Patient reports he eats food and can swallow pills. Patient cannot get out of bed on his own, he needs assistance turning in the bed, he states he could only pivot to the chair with heavy assistance. He has a right chest wall port that has been previously accessed. Tele applied. Alert and oriented and denies pain at this time. VSS.

## 2020-09-26 NOTE — ED Provider Notes (Signed)
[facility] Emergency Department Provider Note  ____________________________________________   Event Date/Time   First MD Initiated Contact with Patient 09/26/20 1811     (approximate)  I have reviewed the triage vital signs and the nursing notes.   HISTORY  Chief Complaint Abnormal Labs    HPI [name] is a 70 y.o. male with ESRD on hemodialysis, hypertension, pulmonary fibrosis, previous lung transplant, diabetes who comes in with lab abnormality.  Patient denies any complaints but states that he came in today because EMS wiped him up and they brought him to the ER.  He states this happened last week to when they brought him here and he was not really quite sure why.  He states that he did have blood done at his rehab facility that he was told that his hemoglobin was low.  He states that they did not even ask him if he wanted to come.  He denies any fatigue, weakness or any other concerns.  Denies any black tarry stool.  Patient reports taking aspirin but no other blood thinners.  No abdominal pain, no shortness of breath. Low blood levels, started today, constant, unclear what brought it on, denies every having transfusion before.           Past Medical History:  Diagnosis Date  . Aortic valve disorders   . Benign neoplasm of colon   . Degeneration of intervertebral disc, site unspecified   . Diabetes mellitus without complication ([place])   . 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 Esophageal reflux   . ESRD (end stage renal disease) on dialysis ([place])   . Essential hypertension   . Obstructive sleep apnea (adult) (pediatric)   . Osteoarthrosis, unspecified whether generalized or localized, unspecified site   . Other and unspecified hyperlipidemia   . Pneumonia    interstitial pneumonia  . Pulmonary fibrosis ([place])   . Renal disorder   . Respiratory failure with hypoxia ([place]) 12/2015  . Transplanted, lung ([place])    . Unspecified essential hypertension     Patient Active Problem List   Diagnosis Date Noted  . Exercise hypoxemia   . Pneumonia 07/17/2020  . Lung mass 07/17/2020  . Acute bronchiolitis due to human metapneumovirus 04/25/2020  . Sepsis with acute organ dysfunction ([place]) 01/08/2020  . Lactic acidosis 01/08/2020  . Mixed diabetic hyperlipidemia associated with type 2 diabetes mellitus ([place]) 01/08/2020  . Hepatic cirrhosis ([place]) 01/08/2020  . Uncontrolled type 2 diabetes mellitus with hyperglycemia, with long-term current use of insulin ([place]) 01/08/2020  . IV infiltrate, initial encounter 01/08/2020  . Acute diarrhea 01/08/2020  . Acute metabolic encephalopathy 09/38/1829  . 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 AV fistula infection, initial encounter ([place]) 01/14/2019  . Immunocompromised state ([place]) 01/14/2019  . Wound infection 01/13/2019  . Sepsis ([place])   . CKD (chronic kidney disease)   . Generalized weakness 12/30/2015  . End-stage renal disease on hemodialysis ([place])   . 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[place])   . Acute respiratory failure with hypoxemia ([place]) 04/18/2015  . Septic shock ([place]) 04/18/2015  . Acute encephalopathy 04/18/2015  . Acute respiratory failure with hypoxia ([place]) 04/18/2015  . Cardiac arrest ([place])   . HCAP (healthcare-associated pneumonia)   . Elevated rheumatoid factor 05/09/2014  . Essential hypertension 05/09/2014  . ILD (interstitial lung disease) ([place]) 05/09/2014  . Obstructive apnea 05/09/2014  . Lung nodule, solitary 04/18/2014  . Awaiting organ transplant 04/18/2014  . Acute on chronic  respiratory failure with hypoxia ([place]) 08/15/2013  . Edema 07/05/2013  . Chronic respiratory failure ([place]) 03/03/2013  . Diabetes mellitus with complication ([place]) 85/88/5027  . Acute sinusitis 05/04/2011  . Cough 11/10/2010  . 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 Obstructive sleep apnea 10/09/2008  . GERD without esophagitis 10/09/2008   . HIATAL HERNIA 10/09/2008  . OSTEOARTHRITIS 10/09/2008    Past Surgical History:  Procedure Laterality Date  . COLONOSCOPY WITH PROPOFOL N/A 11/07/2017   Procedure: COLONOSCOPY WITH PROPOFOL;  Surgeon: Ronnette Juniper, MD;  Location: [facility] ENDOSCOPY;  Service: Gastroenterology;  Laterality: N/A;  . LIGATION OF ARTERIOVENOUS  FISTULA Left 01/14/2019   Procedure: LIGATION OF ARTERIOVENOUS  FISTULA;  Surgeon: Marty Heck, MD;  Location: [place];  Service: Vascular;  Laterality: Left;  . LUNG BIOPSY  2010  . LUNG TRANSPLANT, SINGLE Right   . POLYPECTOMY  11/07/2017   Procedure: POLYPECTOMY;  Surgeon: Ronnette Juniper, MD;  Location: [facility];  Service: Gastroenterology;;    Prior to Admission medications   Medication Sig Start Date End Date Taking? Authorizing Provider  ACCU-CHEK FASTCLIX LANCETS MISC "As directed up to 4 times daily" 12/16/15   [provider]  acetaminophen (TYLENOL) 500 MG tablet Take 500-1,000 mg by mouth every 6 (six) hours as needed for moderate pain or headache.     [provider]  albuterol (VENTOLIN HFA) 108 (90 Base) MCG/ACT inhaler Inhale 1-2 puffs into the lungs daily as needed for wheezing or shortness of breath. 05/12/20   [provider]  Alpha Lipoic Acid 200 MG CAPS Take 200-400 mg by mouth See admin instructions. On Monday, Wednesday, Friday (dialysis days): take 2 capsules (400 mg) by mouth with breakfast and 1 capsule (200 mg) with supper; on Sunday, Tuesday, Thursday, Saturday (non-dialysis days): take 1 capsule (200 mg) three times daily with meals    [provider]  amoxicillin-clavulanate (AUGMENTIN) 875-125 MG tablet TAKE 1 TABLET BY MOUTH TWO TIMES DAILY FOR 7 DAYS. 07/21/20 07/21/21  [name], MD  aspirin 81 MG tablet Take 81 mg by mouth daily with breakfast.    [provider]  azaTHIOprine (IMURAN) 50 MG tablet Take 50 mg by mouth See admin instructions. Take one tablet (50 mg) by mouth on Monday,  Wednesday, Friday after dialysis    [provider]  calcium acetate (PHOSLO) 667 MG capsule Take 667 mg by mouth 3 (three) times daily before meals.     [provider]  cycloSPORINE modified (NEORAL) 25 MG capsule Take 75-100 mg by mouth See admin instructions. Take 3 capsule (75 mg) by mouth daily with breakfast and 4 capsules (100 mg) at bedtime    [provider]  dorzolamidel-timolol (COSOPT PF) 22.3-6.8 MG/ML SOLN ophthalmic solution Place 1 drop into both eyes 2 (two) times daily.    [provider]  fluorometholone (FML) 0.1 % ophthalmic suspension Place 1 drop into the right eye 2 (two) times daily. 04/11/20   [provider]  fluticasone (FLONASE) 50 MCG/ACT nasal spray Place 2 sprays into the nose daily. Patient taking differently: Place 2 sprays into the nose daily as needed for allergies. 03/05/13   Wenda Low, MD  gabapentin (NEURONTIN) 100 MG capsule Take 100 mg by mouth at bedtime. 03/25/20   [provider]  Immune Globulin 10% (PRIVIGEN) 20 GM/200ML SOLN Inject 0.5 g/kg into the vein every 3 (three) months.     [provider]  insulin regular (NOVOLIN R) 100 units/mL injection Inject 5-9  Units into the skin See admin instructions. Inject 5 units subcutaneously with breakfast and 6 units with lunch and supper; PLUS adjustment per sliding scale: CBG [phone] add 1 unit, 261-360 add 2 units, 361-461 add 3 units    [provider]  midodrine (PROAMATINE) 5 MG tablet Take 5-10 mg by mouth See admin instructions. On Monday, Wednesday, Friday(dialysis days): take 1 tablet (5 mg) by mouth before dialysis, take 1 tablet (5 mg) after dialysis as needed for SBP >90. On Sunday, Tuesday, Thursday, Saturday (non-dialysis days): take 1 tablet (5 mg) by mouth 3 times daily as needed for SBP >90. 11/18/19   [provider]  Multiple Vitamin (MULTIVITAMIN WITH MINERALS) TABS tablet Take 1 tablet by mouth daily. Centrum for  Men 50 plus: on Monday, Wednesday, Friday (dialysis days): take one tablet daily with breakfast; on Sunday, Tuesday, Thursday, Saturday: take one tablet daily with supper    [provider]  omeprazole (PRILOSEC) 40 MG capsule Take 40 mg by mouth 2 (two) times daily with a meal.  11/12/18   [provider]  polyvinyl alcohol-povidone (HYPOTEARS) 1.4-0.6 % ophthalmic solution Place 1 drop into both eyes at bedtime.    [provider]  pravastatin (PRAVACHOL) 10 MG tablet Take 10 mg by mouth at bedtime. 03/16/20   [provider]  sulfamethoxazole-trimethoprim (BACTRIM,SEPTRA) 400-80 MG tablet Take 1 tablet by mouth See admin instructions. On Monday, Wednesday, Friday (dialysis days): take one tablet by mouth daily with supper 02/27/17   [provider]  tizanidine (ZANAFLEX) 2 MG capsule Take 1 capsule (2 mg total) by mouth at bedtime as needed for muscle spasms. Patient not taking: Reported on 07/18/2020 07/06/20   Hans Eden, NP  valGANciclovir (VALCYTE) 450 MG tablet Take 450 mg by mouth See admin instructions. Take one tablet (450 mg) by mouth on Monday, Wednesday, and Friday after dialysis 03/26/15   [provider]  vancomycin (VANCOCIN) 125 MG capsule TAKE 1 CAPSULE (125 MG TOTAL) BY MOUTH FOUR TIMES DAILY FOR 14 DAYS. THROUGH 05/05/2020 04/27/20 04/27/21  Arrien, Jimmy Picket, MD  vitamin B-6 (PYRIDOXINE) 25 MG tablet Take 25 mg by mouth daily with breakfast.     [provider]    Allergies Levofloxacin and Nsaids  Family History  Problem Relation Age of Onset  . Pancreatic cancer Brother   . Heart disease Father     Social History Social History   Tobacco Use  . Smoking status: Former Smoker    Packs/day: 0.30    Years: 20.00    Pack years: 6.00    Types: Cigarettes    Quit date: 06/06/2002    Years since quitting: 18.3  . Smokeless tobacco: Never Used  Vaping Use  . Vaping Use: Never used  Substance Use  Topics  . Alcohol use: No    Alcohol/week: 0.0 standard drinks  . Drug use: No      Review of Systems Constitutional: No fever/chills, low blood levels Eyes: No visual changes. ENT: No sore throat. Cardiovascular: Denies chest pain. Respiratory: Denies shortness of breath. Gastrointestinal: No abdominal pain.  No nausea, no vomiting.  No diarrhea.  No constipation. Genitourinary: Negative for dysuria. Musculoskeletal: Negative for back pain. Skin: Negative for rash. Neurological: Negative for headaches, focal weakness or numbness. All other ROS negative ____________________________________________   PHYSICAL EXAM:  VITAL SIGNS: ED Triage Vitals  Enc Vitals Group     BP      Pulse      Resp  Temp      Temp src      SpO2      Weight      Height      Head Circumference      Peak Flow      Pain Score      Pain Loc      Pain Edu?      Excl. in [place]?     Constitutional: Alert and oriented. Well appearing and in no acute distress. Eyes: Conjunctivae are normal. EOMI. Head: Atraumatic. Nose: No congestion/rhinnorhea. Mouth/Throat: Mucous membranes are moist.   Neck: No stridor. Trachea Midline. FROM Cardiovascular: Normal rate, regular rhythm. Grossly normal heart sounds.  Good peripheral circulation. Port cath on right side  Respiratory: Normal respiratory effort.  No retractions. Lungs CTAB. Gastrointestinal: Soft and nontender. No distention. No abdominal bruits.  Musculoskeletal: No lower extremity tenderness nor edema.  No joint effusions. L arm fistula Neurologic:  Normal speech and language. No gross focal neurologic deficits are appreciated.  Skin:  Skin is warm, dry and intact. No rash noted. Psychiatric: Mood and affect are normal. Speech and behavior are normal. GU: brown stool heme occult negative   ____________________________________________   LABS (all labs ordered are listed, but only abnormal results are displayed)  Labs Reviewed  CBC WITH  DIFFERENTIAL/PLATELET - Abnormal; Notable for the following components:      Result Value   RBC 1.94 (*)    Hemoglobin 6.6 (*)    HCT 20.9 (*)    MCV 107.7 (*)    RDW 21.8 (*)    Lymphs Abs 0.2 (*)    Abs Immature Granulocytes 0.13 (*)    All other components within normal limits  COMPREHENSIVE METABOLIC PANEL - Abnormal; Notable for the following components:   Potassium 3.2 (*)    Chloride 95 (*)    Glucose, Bld 252 (*)    BUN 27 (*)    Creatinine, Ser 3.68 (*)    Calcium 8.6 (*)    Albumin 2.0 (*)    AST 70 (*)    Alkaline Phosphatase 669 (*)    Total Bilirubin 1.8 (*)    GFR, Estimated 17 (*)    All other components within normal limits  APTT - Abnormal; Notable for the following components:   aPTT 61 (*)    All other components within normal limits  FERRITIN - Abnormal; Notable for the following components:   Ferritin 2,312 (*)    All other components within normal limits  IRON AND TIBC - Abnormal; Notable for the following components:   Iron 44 (*)    TIBC 200 (*)    All other components within normal limits  RETICULOCYTES - Abnormal; Notable for the following components:   Retic Ct Pct 4.3 (*)    RBC. 2.04 (*)    Immature Retic Fract 24.1 (*)    All other components within normal limits  BASIC METABOLIC PANEL - Abnormal; Notable for the following components:   Potassium 3.3 (*)    Chloride 97 (*)    Glucose, Bld 119 (*)    BUN 34 (*)    Creatinine, Ser 4.15 (*)    Calcium 8.6 (*)    GFR, Estimated 15 (*)    All other components within normal limits  CBC - Abnormal; Notable for the following components:   RBC 2.41 (*)    Hemoglobin 7.9 (*)    HCT 23.6 (*)    RDW 22.2 (*)    All other  components within normal limits  CBC - Abnormal; Notable for the following components:   RBC 2.44 (*)    Hemoglobin 8.0 (*)    HCT 25.0 (*)    MCV 102.5 (*)    RDW 21.7 (*)    All other components within normal limits  RESP PANEL BY RT-PCR (FLU A&B, COVID) ARPGX2  MRSA  PCR SCREENING  PROTIME-INR  LACTATE DEHYDROGENASE  TYPE AND SCREEN  PREPARE RBC (CROSSMATCH)   ____________________________________________   ED ECG REPORT I, Vanessa [place], the attending physician, personally viewed and interpreted this ECG.  EKG is sinus rate of 77, no ST elevation, no T wave inversions, normal intervals although a little bit of artifact, occasional PVC ____________________________________________    ____________________________________________   PROCEDURES  Procedure(s) performed (including Critical Care):  .Critical Care Performed by: Vanessa [place]wick, MD Authorized by: Vanessa Bryan, MD   Critical care provider statement:    Critical care time (minutes):  45   Critical care was necessary to treat or prevent imminent or life-threatening deterioration of the following conditions: anemia requiring transfusion.   Critical care was time spent personally by me on the following activities:  Discussions with consultants, evaluation of patient's response to treatment, examination of patient, ordering and performing treatments and interventions, ordering and review of laboratory studies, ordering and review of radiographic studies, pulse oximetry, re-evaluation of patient's condition, obtaining history from patient or surrogate and review of old charts .1-3 Lead EKG Interpretation Performed by: Vanessa [place], MD Authorized by: Vanessa Roscoe, MD     Interpretation: normal     ECG rate:  70s   ECG rate assessment: normal     Rhythm: sinus rhythm     Ectopy: PVCs     Conduction: normal       ____________________________________________   INITIAL IMPRESSION / ASSESSMENT AND PLAN / ED COURSE  [name] was evaluated in Emergency Department on 09/26/2020 for the symptoms described in the history of present illness. He was evaluated in the context of the global COVID-19 pandemic, which necessitated consideration that the patient might be at risk for  infection with the SARS-CoV-2 virus that causes COVID-19. Institutional protocols and algorithms that pertain to the evaluation of patients at risk for COVID-19 are in a state of rapid change based on information released by regulatory bodies including the CDC and federal and state organizations. These policies and algorithms were followed during the patient's care in the ED.    Pt with low hemoglobin. Keep on cardiac monitor. Hemoglobin dropped from 12--6 over 2 months. Labs to evaluate for 2/2 iron vs could be from kidney disease but wouldn't explain why it dropped so fast. No falls to suggest Retroperitoneal hemorrhage. Stool occult + so consider GI bleed. Given protonix.  Consented pt for blood. Admit to hospital for further workup.        ____________________________________________   FINAL CLINICAL IMPRESSION(S) / ED DIAGNOSES   Final diagnoses:  Symptomatic anemia  ESRD (end stage renal disease) (HCC)  Occult blood positive stool      MEDICATIONS GIVEN DURING THIS VISIT:  Medications  valganciclovir (VALCYTE) 50 MG/ML oral solution 450 mg (has no administration in time range)  pravastatin (PRAVACHOL) tablet 10 mg (has no administration in time range)  cycloSPORINE modified (NEORAL) capsule 100 mg (100 mg Oral Given 09/27/20 0806)  multivitamin liquid 10 mL (10 mLs Oral Given 09/27/20 0806)  pyridOXINE (VITAMIN B-6) tablet 25 mg (25 mg Oral Given 09/27/20 0805)  albuterol (VENTOLIN HFA) 108 (90 Base) MCG/ACT inhaler 1-2 puff (has no administration in time range)  fluticasone (FLONASE) 50 MCG/ACT nasal spray 2 spray (has no administration in time range)  acetaminophen (TYLENOL) tablet 1,000 mg (1,000 mg Oral Given 09/27/20 0802)    Or  acetaminophen (TYLENOL) suppository 650 mg ( Rectal See Alternative 09/27/20 0802)  ondansetron (ZOFRAN) tablet 4 mg (has no administration in time range)    Or  ondansetron (ZOFRAN) injection 4 mg (has no administration in time range)   pantoprazole (PROTONIX) injection 40 mg (40 mg Intravenous Given 09/27/20 0801)  dorzolamide-timolol (COSOPT) 22.3-6.8 MG/ML ophthalmic solution 1 drop (1 drop Both Eyes Given 09/27/20 0807)  vancomycin (VANCOCIN) 50 mg/mL oral solution 125 mg (125 mg Per Tube Given 09/27/20 1029)  midodrine (PROAMATINE) tablet 10 mg (has no administration in time range)  midodrine (PROAMATINE) tablet 10 mg (10 mg Per Tube Given 09/27/20 0801)  predniSONE (DELTASONE) tablet 5 mg (5 mg Per Tube Given 09/27/20 0802)  sulfamethoxazole-trimethoprim (BACTRIM) 400-80 MG per tablet 1 tablet (has no administration in time range)  0.9 %  sodium chloride infusion (0 mL/hr Intravenous Stopped 09/27/20 0753)  pantoprazole (PROTONIX) injection 40 mg (40 mg Intravenous Given 09/27/20 0107)     ED Discharge Orders    None       Note:  This document was prepared using Dragon voice recognition software and may include unintentional dictation errors.   Vanessa , MD 09/27/20 [phone]

## 2020-09-27 ENCOUNTER — Encounter: Payer: Self-pay | Admitting: Internal Medicine

## 2020-09-27 ENCOUNTER — Observation Stay: Payer: Medicare Other

## 2020-09-27 DIAGNOSIS — N186 End stage renal disease: Secondary | ICD-10-CM | POA: Diagnosis not present

## 2020-09-27 DIAGNOSIS — I10 Essential (primary) hypertension: Secondary | ICD-10-CM

## 2020-09-27 DIAGNOSIS — D649 Anemia, unspecified: Secondary | ICD-10-CM | POA: Diagnosis not present

## 2020-09-27 DIAGNOSIS — D500 Iron deficiency anemia secondary to blood loss (chronic): Secondary | ICD-10-CM | POA: Diagnosis not present

## 2020-09-27 DIAGNOSIS — K219 Gastro-esophageal reflux disease without esophagitis: Secondary | ICD-10-CM | POA: Diagnosis not present

## 2020-09-27 DIAGNOSIS — R531 Weakness: Secondary | ICD-10-CM | POA: Diagnosis not present

## 2020-09-27 DIAGNOSIS — Z992 Dependence on renal dialysis: Secondary | ICD-10-CM | POA: Diagnosis not present

## 2020-09-27 DIAGNOSIS — J90 Pleural effusion, not elsewhere classified: Secondary | ICD-10-CM | POA: Diagnosis not present

## 2020-09-27 LAB — BPAM RBC
Blood Product Expiration Date: 202205232359
ISSUE DATE / TIME: 202204232009
Unit Type and Rh: 5100

## 2020-09-27 LAB — CBC
HCT: 23.6 % — ABNORMAL LOW (ref 39.0–52.0)
Hemoglobin: 7.9 g/dL — ABNORMAL LOW (ref 13.0–17.0)
MCH: 32.8 pg (ref 26.0–34.0)
MCHC: 33.5 g/dL (ref 30.0–36.0)
MCV: 97.9 fL (ref 80.0–100.0)
Platelets: 170 10*3/uL (ref 150–400)
RBC: 2.41 MIL/uL — ABNORMAL LOW (ref 4.22–5.81)
RDW: 22.2 % — ABNORMAL HIGH (ref 11.5–15.5)
WBC: 6 10*3/uL (ref 4.0–10.5)
nRBC: 0 % (ref 0.0–0.2)

## 2020-09-27 LAB — TYPE AND SCREEN
ABO/RH(D): O POS
Antibody Screen: NEGATIVE
Unit division: 0

## 2020-09-27 LAB — MRSA PCR SCREENING: MRSA by PCR: NEGATIVE

## 2020-09-27 LAB — BASIC METABOLIC PANEL
Anion gap: 15 (ref 5–15)
BUN: 34 mg/dL — ABNORMAL HIGH (ref 8–23)
CO2: 28 mmol/L (ref 22–32)
Calcium: 8.6 mg/dL — ABNORMAL LOW (ref 8.9–10.3)
Chloride: 97 mmol/L — ABNORMAL LOW (ref 98–111)
Creatinine, Ser: 4.15 mg/dL — ABNORMAL HIGH (ref 0.61–1.24)
GFR, Estimated: 15 mL/min — ABNORMAL LOW (ref >60)
Glucose, Bld: 119 mg/dL — ABNORMAL HIGH (ref 70–99)
Potassium: 3.3 mmol/L — ABNORMAL LOW (ref 3.5–5.1)
Sodium: 140 mmol/L (ref 135–145)

## 2020-09-27 LAB — PREPARE RBC (CROSSMATCH)

## 2020-09-27 MED ORDER — MIDODRINE HCL 5 MG PO TABS
10.0000 mg | ORAL_TABLET | Freq: Every day | ORAL | Status: DC | PRN
Start: 2020-09-27 — End: 2020-09-29

## 2020-09-27 MED ORDER — CHLORHEXIDINE GLUCONATE CLOTH 2 % EX PADS
6.0000 | MEDICATED_PAD | Freq: Every day | CUTANEOUS | Status: DC
  Administered 2020-09-28: 10:00:00 6 via TOPICAL

## 2020-09-27 MED ORDER — DORZOLAMIDE HCL-TIMOLOL MAL 2-0.5 % OP SOLN
1.0000 [drp] | Freq: Two times a day (BID) | OPHTHALMIC | Status: DC
  Administered 2020-09-27 – 2020-09-28 (×3): 1 [drp] via OPHTHALMIC
  Filled 2020-09-27: qty 10

## 2020-09-27 MED ORDER — AMOXICILLIN-POT CLAVULANATE 875-125 MG PO TABS
1.0000 | ORAL_TABLET | Freq: Two times a day (BID) | ORAL | Status: DC
  Administered 2020-09-27: 04:00:00 1 via ORAL
  Filled 2020-09-27 (×2): qty 1

## 2020-09-27 MED ORDER — PREDNISONE 10 MG PO TABS
5.0000 mg | ORAL_TABLET | Freq: Every day | ORAL | Status: DC
  Administered 2020-09-27 – 2020-09-28 (×2): 5 mg
  Filled 2020-09-27 (×3): qty 1

## 2020-09-27 MED ORDER — AMOXICILLIN-POT CLAVULANATE 875-125 MG PO TABS: 1.0000 | ORAL_TABLET | Freq: Two times a day (BID) | ORAL | Status: DC

## 2020-09-27 MED ORDER — VANCOMYCIN 50 MG/ML ORAL SOLUTION
125.0000 mg | Freq: Two times a day (BID) | ORAL | Status: DC
  Administered 2020-09-27 (×2): 125 mg
  Filled 2020-09-27 (×4): qty 2.5

## 2020-09-27 MED ORDER — MIDODRINE HCL 5 MG PO TABS
10.0000 mg | ORAL_TABLET | Freq: Two times a day (BID) | ORAL | Status: DC
  Administered 2020-09-27 – 2020-09-28 (×4): 10 mg
  Filled 2020-09-27 (×4): qty 2

## 2020-09-27 MED ORDER — SULFAMETHOXAZOLE-TRIMETHOPRIM 400-80 MG PO TABS
1.0000 | ORAL_TABLET | ORAL | Status: DC
  Administered 2020-09-28: 1 via ORAL
  Filled 2020-09-27: qty 1

## 2020-09-27 MED ORDER — PANTOPRAZOLE SODIUM 40 MG IV SOLR
40.0000 mg | Freq: Two times a day (BID) | INTRAVENOUS | Status: DC
  Administered 2020-09-27 – 2020-09-28 (×3): 40 mg via INTRAVENOUS
  Filled 2020-09-27 (×4): qty 40

## 2020-09-27 NOTE — Progress Notes (Signed)
[place] [facility] [place] NAME: Colie Fugitt    MR#:  [id number]  DATE OF BIRTH:  02-24-1951  SUBJECTIVE:   Patient came in from his rehab facility with abnormal hemoglobin of 6.6 on routine labs. Patient is asymptomatic. Denies any black colored stools. Got one unit of blood transfusion hemoglobin is stable at 8.0 post transfusion.  Patient was recently discharged from [facility] on 14th of April after being admitted for pneumonia in the setting of interstitial lung disease with history of transplant. He finished a course of antibiotic IV and oral. Currently on his immunosuppressive medications along with prophylactic meds  Patient said he ate good breakfast. He gets tube feeding as well.  REVIEW OF SYSTEMS:   Review of Systems  Constitutional: Negative for chills, fever and weight loss.  HENT: Negative for ear discharge, ear pain and nosebleeds.   Eyes: Negative for blurred vision, pain and discharge.  Respiratory: Negative for sputum production, shortness of breath, wheezing and stridor.   Cardiovascular: Negative for chest pain, palpitations, orthopnea and PND.  Gastrointestinal: Negative for abdominal pain, diarrhea, nausea and vomiting.  Genitourinary: Negative for frequency and urgency.  Musculoskeletal: Negative for back pain and joint pain.  Neurological: Positive for weakness. Negative for sensory change, speech change and focal weakness.  Psychiatric/Behavioral: Negative for depression and hallucinations. The patient is not nervous/anxious.    Tolerating Diet: yes Tolerating PT:   DRUG ALLERGIES:   Allergies  Allergen Reactions  . Cefepime Other (See Comments)    Altered mental status  . Levofloxacin Other (See Comments)    LOSS OF CONSCIOUSNESS  . Nsaids Other (See Comments)    Patient instructed not to take NSAID's after his lung transplant  . Tolmetin     VITALS:  Blood pressure (!) 108/40, pulse 70, temperature 97.8 F  (36.6 C), resp. rate 14, height 5\' 3"  (1.6 m), weight 61.5 kg, SpO2 100 %.  PHYSICAL EXAMINATION:   Physical Exam  GENERAL:  70 y.o.-year-old patient lying in the bed with no acute distress. Chronically ill LUNGS: decreased breath sounds bilaterally, no wheezing, rales, rhonchi. No use of accessory muscles of respiration.  CARDIOVASCULAR: S1, S2 normal. No murmurs, rubs, or gallops.  ABDOMEN: Soft, nontender, nondistended. Bowel sounds present. No organomegaly or mass. Peg tube site looks okay EXTREMITIES: No cyanosis, clubbing or edema b/l.    NEUROLOGIC: Cranial nerves II through XII are intact. No focal Motor or sensory deficits b/l.   PSYCHIATRIC:  patient is alert and oriented x 3.  SKIN: No obvious rash, lesion, or ulcer.   LABORATORY PANEL:  CBC Recent Labs  Lab 09/27/20 0502  WBC 6.0  HGB 7.9*  HCT 23.6*  PLT 170    Chemistries  Recent Labs  Lab 09/26/20 1852 09/27/20 0502  NA 138 140  K 3.2* 3.3*  CL 95* 97*  CO2 30 28  GLUCOSE 252* 119*  BUN 27* 34*  CREATININE 3.68* 4.15*  CALCIUM 8.6* 8.6*  AST 70*  --   ALT 43  --   ALKPHOS 669*  --   BILITOT 1.8*  --    Cardiac Enzymes No results for input(s): TROPONINI in the last 168 hours. RADIOLOGY:  DG Chest Port 1 View  Result Date: 09/27/2020 CLINICAL DATA:  Anemia EXAM: PORTABLE CHEST 1 VIEW COMPARISON:  09/18/2020 FINDINGS: There is fibrotic change in the left lung with unchanged left apical opacity. Right IJ approach central venous catheter tip is in unchanged position. Small  right pleural effusion. IMPRESSION: 1. Unchanged appearance of the chest with fibrotic changes in the left lung. Electronically Signed   By: Ulyses Jarred M.D.   On: 09/27/2020 02:26   ASSESSMENT AND PLAN:  ARJAN STROHM is a 70 y.o. male with medical history significant for interstitial lung disease, status post complex, lung transplant in 2016, history of lung hematoma, end-stage renal disease on hemodialysis Monday Wednesday  Friday, hyperlipidemia, protein malnutrition requiring tube feeding, debility, on chronic immunosuppression, presents to the emergency department for chief concerns of abnormal labs per facility  Asymptomatic severe anemia- suspect blood loss due to nasal bleed last week accordingly wife -- hemoglobin around 10 -  came in with hemoglobin of 6.6--1 unit packed red blood cells-- 8.0 -- has multiple medical problems including end-stage renal disease contributing to anemia -- has history of duodenal ulcer however no active signs of bleeding. -- G.I. consultation with Dr. Vicente Males appreciated. Discussed with patient and patient's wife continue conservative management.  History of chronic respiratory failure due to interstitial lung disease  status post lung transplant-resumed home cyclosporine 100 mg twice daily, Bactrim 1 tablet Monday Wednesday Friday, valganciclovir 450 mg Monday Wednesday Friday -Immunosuppressive medication, prophylactic medications resumed  -- Patient recently finished a course of antibiotic IV and oral. He was discharged from [facility] 14th April after being admitted for pneumonia  Hyperlipidemia-pravastatin   GERD-Protonix  End-stage renal disease on hemodialysis  consult nephrology for dialysis resumption-- Monday Wednesday Friday  DVT prophylaxis- pharmacologic DVT prophylaxis has not been resumed due to kidney anemia   Procedures: Family communication : wife Molli Posey on the phone Consults : G.I., nephrology CODE STATUS: full DVT Prophylaxis : SCD Level of care: Med-Surg Status is: Observation  The patient remains OBS appropriate and will d/c before 2 midnights.  Dispo: The patient is from: SNF              Anticipated d/c is to: SNF              Patient currently is medically stable to d/c.   Difficult to place patient No        TOTAL TIME TAKING CARE OF THIS PATIENT: *25* minutes.  >50% time spent on counselling and coordination of care  Note: This  dictation was prepared with Dragon dictation along with smaller phrase technology. Any transcriptional errors that result from this process are unintentional.  Fritzi Mandes M.D    [facility]   CC: Primary care physician; Wenda Low, MDPatient ID: Delia Heady, male   DOB: [birth year], 70 y.o.   MRN: [id number]

## 2020-09-27 NOTE — Consult Note (Signed)
[name] MRN: [id number] DOB/AGE: [birth year] 70 y.o. Primary Care Physician:Husain, Denton Ar, MD Admit date: 09/26/2020 Chief Complaint:  Chief Complaint  Patient presents with  . Abnormal Labs   HPI: [name] is a 70 y.o. male with medical history significant for interstitial lung disease, status post complex, lung transplant in 2016, history of lung hematoma, end-stage renal disease on hemodialysis Monday Wednesday Friday, hyperlipidemia, protein malnutrition requiring tube feeding, debility, on chronic immunosuppression, presents to the emergency department for chief concerns of abnormal labs per facility.  Patient seen today on second floor Patient offers no specific physical complaints No complaint of change in taste No complaint of chest pain No complaint of shortness of breath No complaint of fever cough or chills No complaint of frequency urgency or dysuria No complaint of syncope No abdominal hematuria Patient stated I am only here because they told me I needed blood transfusion   Past Medical History:  Diagnosis Date  . Aortic valve disorders   . Benign neoplasm of colon   . Degeneration of intervertebral disc, site unspecified   . Diabetes mellitus without complication ([place])   . 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 Esophageal reflux   . ESRD (end stage renal disease) on dialysis ([place])   . Essential hypertension   . Obstructive sleep apnea (adult) (pediatric)   . Osteoarthrosis, unspecified whether generalized or localized, unspecified site   . Other and unspecified hyperlipidemia   . Pneumonia    interstitial pneumonia  . Pulmonary fibrosis ([place])   . Renal disorder   . Respiratory failure with hypoxia ([place]) 12/2015  . Transplanted, lung ([place])   . Unspecified essential hypertension         Family History  Problem Relation Age of Onset  . Pancreatic cancer Brother   . Heart disease Father     Social  History:  reports that he quit smoking about 18 years ago. His smoking use included cigarettes. He has a 6.00 pack-year smoking history. He has never used smokeless tobacco. He reports that he does not drink alcohol and does not use drugs.   Allergies:  Allergies  Allergen Reactions  . Cefepime Other (See Comments)    Altered mental status  . Levofloxacin Other (See Comments)    LOSS OF CONSCIOUSNESS  . Nsaids Other (See Comments)    Patient instructed not to take NSAID's after his lung transplant  . Tolmetin     Medications Prior to Admission  Medication Sig Dispense Refill  . acetaminophen (TYLENOL) 160 MG/5ML suspension Place 649.6 mg into feeding tube every 6 (six) hours as needed.    Marland Kitchen albuterol (VENTOLIN HFA) 108 (90 Base) MCG/ACT inhaler Inhale 1-2 puffs into the lungs daily as needed for wheezing or shortness of breath.    Marland Kitchen aspirin 81 MG tablet Take 81 mg by mouth daily with breakfast.    . cycloSPORINE (GENGRAF) 100 MG/ML microemulsion solution Take 100 mg by mouth every 12 (twelve) hours.    . dorzolamidel-timolol (COSOPT PF) 22.3-6.8 MG/ML SOLN ophthalmic solution Place 1 drop into both eyes 2 (two) times daily.    . fluticasone (FLONASE) 50 MCG/ACT nasal spray Place 2 sprays into the nose daily. (Patient taking differently: Place 2 sprays into the nose daily as needed for allergies.) 30 g 2  . heparin 5000 UNIT/ML injection Inject 1 mL into the skin every 12 (twelve) hours.    . insulin regular (NOVOLIN R) 100 units/mL injection Inject 6 Units into  the skin 2 (two) times daily. Give in addition to any sliding scale insulin that is needed    . insulin regular (NOVOLIN R) 100 units/mL injection Inject 8 Units into the skin 2 (two) times daily. Give in addition to any sliding scale insulin that is needed.    . insulin regular (NOVOLIN R) 100 units/mL injection Inject into the skin See admin instructions. Inject per sliding scale: if 0-199=0; 200-250=1; 251-300=2; 301-350=3;  351-400=4 and call MD, subcutaneously four times a day. Give in addition to schedule amount.    . lidocaine (LIDODERM) 5 % Place 1 patch onto the skin daily. Place 1 patch onto the skin daily for 30 days Apply patch to the most painful area for up to 12 hours in a 24 hour period.    . Loperamide HCl 1 MG/7.5ML LIQD Take 15 mLs (2 mg total) by G tube 3 (three) times daily as needed for Diarrhea    . midodrine (PROAMATINE) 10 MG tablet Place 10 mg into feeding tube daily as needed (for hypotension prior to PT/OT PRN for BP).    . midodrine (PROAMATINE) 10 MG tablet Place 10 mg into feeding tube 2 (two) times daily. Give before dialysis    . Multiple Vitamin (MULTIVITAMIN WITH MINERALS) TABS tablet Place 1 tablet into feeding tube daily. Centrum for Men 50 plus: on Monday, Wednesday, Friday (dialysis days): take one tablet daily with breakfast; on Sunday, Tuesday, Thursday, Saturday: take one tablet daily with supper    . omeprazole (FIRST-OMEPRAZOLE) 2 mg/mL SUSP oral suspension Place 40 mg into feeding tube daily.    . ondansetron (ZOFRAN) 4 MG tablet Place 4 mg into feeding tube every 6 (six) hours as needed for nausea or vomiting.    . pravastatin (PRAVACHOL) 10 MG tablet Place 10 mg into feeding tube at bedtime.    . predniSONE (DELTASONE) 5 MG tablet Take 1 tablet (5 mg total) by G tube once daily . Crush and administer via GTube daily.    Marland Kitchen sulfamethoxazole-trimethoprim (BACTRIM,SEPTRA) 400-80 MG tablet Place 1 tablet into feeding tube See admin instructions. On Monday, Wednesday, Friday (dialysis days): take one tablet by mouth daily with supper    . tuberculin 5 UNIT/0.1ML injection Inject 0.1 mLs into the skin See admin instructions. Inject 0.1 mL every evening shift every 7 days for TB screening for 2 Administrations Document on Immunization Record. Also schedule "Two Step PPD read" to start 2 days after first administration of PPD.    Marland Kitchen valganciclovir (VALCYTE) 50 MG/ML SOLR Take 450 mg by mouth  See admin instructions. Take one tablet (450 mg) by mouth on Monday, Wednesday, and Friday after dialysis    . Vancomycin HCl 50 MG/ML SOLR Take 2.5 mLs (125 mg total) by tube every 12 hours for 13 days. Last dose on 09/27/20.    Marland Kitchen vitamin B-6 (PYRIDOXINE) 25 MG tablet Place 25 mg into feeding tube daily with breakfast.    . ACCU-CHEK FASTCLIX LANCETS MISC "As directed up to 4 times daily"  3  . Alpha Lipoic Acid 200 MG CAPS Take 200-400 mg by mouth See admin instructions. On Monday, Wednesday, Friday (dialysis days): take 2 capsules (400 mg) by mouth with breakfast and 1 capsule (200 mg) with supper; on Sunday, Tuesday, Thursday, Saturday (non-dialysis days): take 1 capsule (200 mg) three times daily with meals (Patient not taking: Reported on 09/26/2020)    . amoxicillin-clavulanate (AUGMENTIN) 875-125 MG tablet TAKE 1 TABLET BY MOUTH TWO TIMES DAILY FOR 7 DAYS. (  Patient not taking: Reported on 09/26/2020) 14 tablet 0  . azaTHIOprine (IMURAN) 50 MG tablet Take 50 mg by mouth See admin instructions. Take one tablet (50 mg) by mouth on Monday, Wednesday, Friday after dialysis (Patient not taking: Reported on 09/26/2020)    . calcium acetate (PHOSLO) 667 MG capsule Take 667 mg by mouth 3 (three) times daily before meals.  (Patient not taking: Reported on 09/26/2020)    . fluorometholone (FML) 0.1 % ophthalmic suspension Place 1 drop into the right eye 2 (two) times daily. (Patient not taking: Reported on 09/26/2020)    . gabapentin (NEURONTIN) 100 MG capsule Take 100 mg by mouth at bedtime. (Patient not taking: Reported on 09/26/2020)    . Immune Globulin 10% (PRIVIGEN) 20 GM/200ML SOLN Inject 0.5 g/kg into the vein every 3 (three) months.  (Patient not taking: Reported on 09/26/2020)    . insulin regular (NOVOLIN R) 100 units/mL injection Inject 5-9 Units into the skin See admin instructions. Inject 5 units subcutaneously with breakfast and 6 units with lunch and supper; PLUS adjustment per sliding scale: CBG  [phone] add 1 unit, 261-360 add 2 units, [phone] add 3 units (Patient not taking: No sig reported)    . midodrine (PROAMATINE) 5 MG tablet Take 5-10 mg by mouth See admin instructions. On Monday, Wednesday, Friday(dialysis days): take 1 tablet (5 mg) by mouth before dialysis, take 1 tablet (5 mg) after dialysis as needed for SBP >90. On Sunday, Tuesday, Thursday, Saturday (non-dialysis days): take 1 tablet (5 mg) by mouth 3 times daily as needed for SBP >90. (Patient not taking: No sig reported)    . omeprazole (PRILOSEC) 40 MG capsule Take 40 mg by mouth 2 (two) times daily with a meal.  (Patient not taking: Reported on 09/26/2020)    . polyvinyl alcohol-povidone (HYPOTEARS) 1.4-0.6 % ophthalmic solution Place 1 drop into both eyes at bedtime. (Patient not taking: Reported on 09/26/2020)    . tizanidine (ZANAFLEX) 2 MG capsule Take 1 capsule (2 mg total) by mouth at bedtime as needed for muscle spasms. (Patient not taking: No sig reported) 7 capsule 0  . vancomycin (VANCOCIN) 125 MG capsule TAKE 1 CAPSULE (125 MG TOTAL) BY MOUTH FOUR TIMES DAILY FOR 14 DAYS. THROUGH 05/05/2020 (Patient not taking: Reported on 09/26/2020) 40 capsule 0       DXI:PJASN from the symptoms mentioned above,there are no other symptoms referable to all systems reviewed.  Derrill Memo ON 09/28/2020] Chlorhexidine Gluconate Cloth  6 each Topical Q0600  . cycloSPORINE modified  100 mg Oral BID  . dorzolamide-timolol  1 drop Both Eyes BID  . midodrine  10 mg Per Tube BID  . multivitamin  10 mL Oral Daily  . pantoprazole (PROTONIX) IV  40 mg Intravenous Q12H  . pravastatin  10 mg Oral QHS  . predniSONE  5 mg Per Tube Q breakfast  . vitamin B-6  25 mg Oral Q breakfast  . [START ON 09/28/2020] sulfamethoxazole-trimethoprim  1 tablet Oral Once per day on Mon Wed Fri  . [START ON 09/28/2020] valganciclovir  450 mg Oral Once per day on Mon Wed Fri  . vancomycin  125 mg Per Tube Q12H       Physical Exam: Vital signs in last 24  hours: Temp:  [97.6 F (36.4 C)-98.1 F (36.7 C)] 97.8 F (36.6 C) (04/24 1158) Pulse Rate:  [70-80] 70 (04/24 1158) Resp:  [14-23] 14 (04/24 1158) BP: (101-132)/(40-88) 108/40 (04/24 1158) SpO2:  [97 %-100 %] 100 % (  04/24 1158) Weight:  [58.1 kg-61.5 kg] 61.5 kg (04/23 2307) Weight change:  Last BM Date: 09/26/20  Intake/Output from previous day: 04/23 0701 - 04/24 0700 In: 330 [Blood:330] Out: -  Total I/O In: 120 [P.O.:120] Out: -    Physical Exam: General- pt is awake,alert, oriented to time place and person Resp- No acute REsp distress, CTA B/L NO Rhonchi CVS- S1S2 regular ij rate and rhythm GIT- BS+, soft, NT, ND EXT- NO LE Edema, Cyanosis CNS- CN 2-12 grossly intact. Moving all 4 extremities Psych- normal mod and affect Access- AVF   Lab Results: CBC Recent Labs    09/26/20 2259 09/27/20 0502  WBC 7.1 6.0  HGB 8.0* 7.9*  HCT 25.0* 23.6*  PLT 169 170    BMET Recent Labs    09/26/20 1852 09/27/20 0502  NA 138 140  K 3.2* 3.3*  CL 95* 97*  CO2 30 28  GLUCOSE 252* 119*  BUN 27* 34*  CREATININE 3.68* 4.15*  CALCIUM 8.6* 8.6*    MICRO Recent Results (from the past 240 hour(s))  Resp Panel by RT-PCR (Flu A&B, Covid) Nasopharyngeal Swab     Status: None   Collection Time: 09/26/20  9:39 PM   Specimen: Nasopharyngeal Swab; Nasopharyngeal(NP) swabs in vial transport medium  Result Value Ref Range Status   SARS Coronavirus 2 by RT PCR NEGATIVE NEGATIVE Final    Comment: (NOTE) SARS-CoV-2 target nucleic acids are NOT DETECTED.  The SARS-CoV-2 RNA is generally detectable in upper respiratory specimens during the acute phase of infection. The lowest concentration of SARS-CoV-2 viral copies this assay can detect is 138 copies/mL. A negative result does not preclude SARS-Cov-2 infection and should not be used as the sole basis for treatment or other patient management decisions. A negative result may occur with  improper specimen collection/handling,  submission of specimen other than nasopharyngeal swab, presence of viral mutation(s) within the areas targeted by this assay, and inadequate number of viral copies(<138 copies/mL). A negative result must be combined with clinical observations, patient history, and epidemiological information. The expected result is Negative.  Fact Sheet for Patients:  EntrepreneurPulse.com.au  Fact Sheet for Healthcare Providers:  IncredibleEmployment.be  This test is no t yet approved or cleared by the [place] FDA and  has been authorized for detection and/or diagnosis of SARS-CoV-2 by FDA under an Emergency Use Authorization (EUA). This EUA will remain  in effect (meaning this test can be used) for the duration of the COVID-19 declaration under Section 564(b)(1) of the Act, 21 U.S.C.section 360bbb-3(b)(1), unless the authorization is terminated  or revoked sooner.       Influenza A by PCR NEGATIVE NEGATIVE Final   Influenza B by PCR NEGATIVE NEGATIVE Final    Comment: (NOTE) The Xpert Xpress SARS-CoV-2/FLU/RSV plus assay is intended as an aid in the diagnosis of influenza from Nasopharyngeal swab specimens and should not be used as a sole basis for treatment. Nasal washings and aspirates are unacceptable for Xpert Xpress SARS-CoV-2/FLU/RSV testing.  Fact Sheet for Patients: EntrepreneurPulse.com.au  Fact Sheet for Healthcare Providers: IncredibleEmployment.be  This test is not yet approved or cleared by the [place] FDA and has been authorized for detection and/or diagnosis of SARS-CoV-2 by FDA under an Emergency Use Authorization (EUA). This EUA will remain in effect (meaning this test can be used) for the duration of the COVID-19 declaration under Section 564(b)(1) of the Act, 21 U.S.C. section 360bbb-3(b)(1), unless the authorization is terminated or revoked.  Performed at [facility]  Lab, [place], [place] [address]   MRSA PCR Screening     Status: None   Collection Time: 09/27/20  4:30 AM   Specimen: Nasopharyngeal  Result Value Ref Range Status   MRSA by PCR NEGATIVE NEGATIVE Final    Comment:        The GeneXpert MRSA Assay (FDA approved for NASAL specimens only), is one component of a comprehensive MRSA colonization surveillance program. It is not intended to diagnose MRSA infection nor to guide or monitor treatment for MRSA infections. Performed at [facility], [address] [place], [place] [address]       Lab Results  Component Value Date   CALCIUM 8.6 (L) 09/27/2020   CAION 0.94 (L) 02/05/2019   PHOS 5.9 (H) 07/21/2020      Impression:  [name] is a 70 y.o. male with medical history significant for interstitial lung disease, status post complex, lung transplant in 2016, history of lung hematoma, end-stage renal disease on hemodialysis Monday Wednesday Friday, hyperlipidemia, protein malnutrition requiring tube feeding, debility, on chronic immunosuppression, who was admitted with chief complaint of   Symptomatic anemia   Obstructive sleep apnea   GERD without esophagitis   Essential hypertension   ILD (interstitial lung disease) ([place])   End-stage renal disease on hemodialysis ([place])   Generalized weakness   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[place])   Hepatic cirrhosis ([place])   Lung mass      Patient outpatient dialysis center is at.[facility] [phone] (Work) [place], [place] [address]  Patient outpatient regimen is 180NRe, 4 hours, BFR 400, DFR 500 (due to dialyzer shortage),  EDW 58kg,   AVF 15g,  heparin 1800 unit bolus Hectorol 3 mcg IV q HD  1)Renal   End-stage renal disease patient is on hemodialysis Patient is on Monday Wednesday Friday schedule No need for emergent dialysis today We will dialyze patient in the morning   2)HTN   Blood pressure is stable    3)Anemia of  chronic disease  CBC Latest Ref Rng & Units 09/27/2020 09/26/2020 09/26/2020  WBC 4.0 - 10.5 K/uL 6.0 7.1 7.2  Hemoglobin 13.0 - 17.0 g/dL 7.9(L) 8.0(L) 6.6(L)  Hematocrit 39.0 - 52.0 % 23.6(L) 25.0(L) 20.9(L)  Platelets 150 - 400 K/uL 170 169 35       HGb is not at goal (9--11) Cannot give patient Epogen in the presence of new diagnosis of lung cancer. Patient received PRBC Patient hemoglobin is now better  4) Secondary hyperparathyroidism -CKD Mineral-Bone Disorder    Lab Results  Component Value Date   CALCIUM 8.6 (L) 09/27/2020   CAION 0.94 (L) 02/05/2019   PHOS 5.9 (H) 07/21/2020    Secondary Hyperparathyroidism present Phosphorus is not at goal. Patient is on binders  5) lung mass   Patient data from care everywhere was reviewed Patient CT scan done on September 15, 2020 at [facility] had shown Lungs and Airways: Right middle lobe groundglass opacities similar to  prior. A few scattered right upper lobe subcentimeter pulmonary nodules  which are new compared to prior (4:156, 169). Increased size of centrally  necrotic left upper lobe mass now measuring 5.9 x 5.3 cm, previously 4.3 x  3.9, which invades the adjacent posterior left third rib. Stable fibrotic  changes throughout the left lung.      6) Electrolytes   BMP Latest Ref Rng & Units 09/27/2020 09/26/2020 07/21/2020  Glucose 70 - 99 mg/dL 119(H) 252(H) 146(H)  BUN 8 -  23 mg/dL 34(H) 27(H) 60(H)  Creatinine 0.61 - 1.24 mg/dL 4.15(H) 3.68(H) 11.52(H)  Sodium 135 - 145 mmol/L 140 138 134(L)  Potassium 3.5 - 5.1 mmol/L 3.3(L) 3.2(L) 4.9  Chloride 98 - 111 mmol/L 97(L) 95(L) 91(L)  CO2 22 - 32 mmol/L 28 30 20(L)  Calcium 8.9 - 10.3 mg/dL 8.6(L) 8.6(L) 8.1(L)     Sodium Normonatremic   Potassium Hypokalemic Being replete    7)Acid base  Co2 at goal     Plan:   NO need for HD today Will dialyze in am Will not give epo  Will use 3K bath    Jhalen Eley s [facility] 09/27/2020, 2:33 PM

## 2020-09-27 NOTE — H&P (Signed)
History and Physical   [name] MHD:[id number] DOB: [birth year] DOA: 09/26/2020  PCP: Wenda Low, MD  Outpatient Specialists: Dr. Sarina Ser, [facility] Patient coming from: rehab facility  I have personally briefly reviewed patient's old medical records in [place].  Chief Concern: Abnormal lab  HPI: [name] is a 70 y.o. male with medical history significant for interstitial lung disease, status post complex, lung transplant in 2016, history of lung hematoma, end-stage renal disease on hemodialysis Monday Wednesday Friday, hyperlipidemia, protein malnutrition requiring tube feeding, debility, on chronic immunosuppression, presents to the emergency department for chief concerns of abnormal labs per facility.  At bedside, patient states he does not have chest pain, shortness of breath, abdominal pain, fever, dysuria, hematuria, headache, dysphagia.  Social history: He denies tobacco, EtOH, recreational drug use.  ROS: Constitutional: no weight change, no fever ENT/Mouth: no sore throat, no rhinorrhea Eyes: no eye pain, no vision changes Cardiovascular: no chest pain, no dyspnea,  no edema, no palpitations Respiratory: no cough, no sputum, no wheezing Gastrointestinal: no nausea, no vomiting, no diarrhea, no constipation Genitourinary: no urinary incontinence, no dysuria, no hematuria Musculoskeletal: no arthralgias, no myalgias Skin: no skin lesions, no pruritus, Neuro: + weakness, no loss of consciousness, no syncope Psych: no anxiety, no depression, + decrease appetite Heme/Lymph: no bruising, no bleeding  ED Course: Discussed with ED provider, patient requiring hospitalization for severe anemia.  Vitals in the emergency department was remarkable for temperature of 98.1, respiration rate of 18, heart rate 76, blood pressure 132/70, SPO2 of 100% on room air.  Labs in the emergency department was remarkable for WBC 7.2, hemoglobin 6.6,  hematocrit 20.9, platelets 172, sodium 138, potassium 3.2, bicarb 30, BUN 27, serum creatinine of 3.68, nonfasting blood glucose 252.  EDP ordered 1 unit packed red blood cells.  Assessment/Plan  Principal Problem:   Anemia Active Problems:   Obstructive sleep apnea   GERD without esophagitis   Essential hypertension   ILD (interstitial lung disease) (HCC)   End-stage renal disease on hemodialysis (HCC)   Generalized weakness   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[place])   Hepatic cirrhosis (HCC)   Lung mass   Asymptomatic severe anemia- etiology work-up in progress, suspect secondary to acute on chronic anemia in setting of new diagnosis of squamous cell carcinoma of the left lung - 1 unit packed red blood cells - Goal hemoglobin is 8 - ED provider did a fecal occult and it was positive, status post Protonix 40 mg IV once per EDP - Protonix 40 mg IV twice daily ordered - Consider GI work-up for possible upper GI bleeding - CBC in the a.m. - MedSurg, observation, with telemetry  Status post lung transplant-resumed home cyclosporine 100 mg twice daily, Bactrim 1 tablet Monday Wednesday Friday, valganciclovir 450 mg Monday Wednesday Friday -Immunosuppressive medication, prophylactic medications resumed  Hyperlipidemia-pravastatin 10 mg nightly  GERD-Protonix End-stage renal disease on hemodialysis, a.m. team to consult nephrology for dialysis resumption  Antihypotensive medication as needed resumed  DVT prophylaxis- pharmacologic DVT prophylaxis has not been resumed due to kidney anemia, a.m. team to when appropriate Chart reviewed.   Interstitial lung disease status post prolonged post transplant hospital course, discharge home 4 months after transplant on 03/10/2015, he developed hypoxic/hypercarbic respiratory failure requiring tracheostomy, prolonged ventilator wean, decannulated just prior to discharge, he required ECMO, recurrent thoracic hematomas, taken back to the OR for VATS  decortication, multiple washout, developed end-stage renal disease presumed ATN, required CRRT, then hemodialysis, now an uric.  DVT prophylaxis: Pharmacologic DVT prophylaxis has not been ordered due to acute anemia Code Status: Full code Diet: Heart healthy/carb modified/renal Family Communication: No Disposition Plan: Pending clinical course Consults called: None at this time Admission status: MedSurg, observation, with telemetry  Past Medical History:  Diagnosis Date  . Aortic valve disorders   . Benign neoplasm of colon   . Degeneration of intervertebral disc, site unspecified   . Diabetes mellitus without complication ([place])   . 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 Esophageal reflux   . ESRD (end stage renal disease) on dialysis ([place])   . Essential hypertension   . Obstructive sleep apnea (adult) (pediatric)   . Osteoarthrosis, unspecified whether generalized or localized, unspecified site   . Other and unspecified hyperlipidemia   . Pneumonia    interstitial pneumonia  . Pulmonary fibrosis ([place])   . Renal disorder   . Respiratory failure with hypoxia ([place]) 12/2015  . Transplanted, lung ([place])   . Unspecified essential hypertension    Past Surgical History:  Procedure Laterality Date  . COLONOSCOPY WITH PROPOFOL N/A 11/07/2017   Procedure: COLONOSCOPY WITH PROPOFOL;  Surgeon: Ronnette Juniper, MD;  Location: [facility];  Service: Gastroenterology;  Laterality: N/A;  . LIGATION OF ARTERIOVENOUS  FISTULA Left 01/14/2019   Procedure: LIGATION OF ARTERIOVENOUS  FISTULA;  Surgeon: Marty Heck, MD;  Location: [place];  Service: Vascular;  Laterality: Left;  . LUNG BIOPSY  2010  . LUNG TRANSPLANT, SINGLE Right   . POLYPECTOMY  11/07/2017   Procedure: POLYPECTOMY;  Surgeon: Ronnette Juniper, MD;  Location: [facility];  Service: Gastroenterology;;   Social History:  reports that he quit smoking about 18 years ago. His smoking use included cigarettes. He  has a 6.00 pack-year smoking history. He has never used smokeless tobacco. He reports that he does not drink alcohol and does not use drugs.  Allergies  Allergen Reactions  . Cefepime Other (See Comments)    Altered mental status  . Levofloxacin Other (See Comments)    LOSS OF CONSCIOUSNESS  . Nsaids Other (See Comments)    Patient instructed not to take NSAID's after his lung transplant  . Tolmetin    Family History  Problem Relation Age of Onset  . Pancreatic cancer Brother   . Heart disease Father    Family history: Family history reviewed and not pertinent  Prior to Admission medications   Medication Sig Start Date End Date Taking? Authorizing Provider  acetaminophen (TYLENOL) 160 MG/5ML suspension Place 649.6 mg into feeding tube every 6 (six) hours as needed. 09/15/20  Yes [provider]  albuterol (VENTOLIN HFA) 108 (90 Base) MCG/ACT inhaler Inhale 1-2 puffs into the lungs daily as needed for wheezing or shortness of breath. 05/12/20  Yes [provider]  aspirin 81 MG tablet Take 81 mg by mouth daily with breakfast.   Yes [provider]  cycloSPORINE (GENGRAF) 100 MG/ML microemulsion solution Take 100 mg by mouth every 12 (twelve) hours.   Yes [provider]  dorzolamidel-timolol (COSOPT PF) 22.3-6.8 MG/ML SOLN ophthalmic solution Place 1 drop into both eyes 2 (two) times daily.   Yes [provider]  fluticasone (FLONASE) 50 MCG/ACT nasal spray Place 2 sprays into the nose daily. Patient taking differently: Place 2 sprays into the nose daily as needed for allergies. 03/05/13  Yes Wenda Low, MD  heparin 5000 UNIT/ML injection Inject 1 mL into the skin every 12 (twelve) hours. 09/15/20  Yes [provider]  insulin regular (NOVOLIN R) 100 units/mL injection Inject 6 Units into the skin 2 (two) times daily. Give in addition to any sliding scale insulin that is needed   Yes [provider]  insulin regular (NOVOLIN  R) 100 units/mL injection Inject 8 Units into the skin 2 (two) times daily. Give in addition to any sliding scale insulin that is needed.   Yes [provider]  insulin regular (NOVOLIN R) 100 units/mL injection Inject into the skin See admin instructions. Inject per sliding scale: if 0-199=0; 200-250=1; 251-300=2; 301-350=3; 351-400=4 and call MD, subcutaneously four times a day. Give in addition to schedule amount.   Yes [provider]  lidocaine (LIDODERM) 5 % Place 1 patch onto the skin daily. Place 1 patch onto the skin daily for 30 days Apply patch to the most painful area for up to 12 hours in a 24 hour period. 09/16/20 10/16/20 Yes [provider]  Loperamide HCl 1 MG/7.5ML LIQD Take 15 mLs (2 mg total) by G tube 3 (three) times daily as needed for Diarrhea 09/15/20  Yes [provider]  midodrine (PROAMATINE) 10 MG tablet Place 10 mg into feeding tube daily as needed (for hypotension prior to PT/OT PRN for BP).   Yes [provider]  midodrine (PROAMATINE) 10 MG tablet Place 10 mg into feeding tube 2 (two) times daily. Give before dialysis   Yes [provider]  Multiple Vitamin (MULTIVITAMIN WITH MINERALS) TABS tablet Place 1 tablet into feeding tube daily. Centrum for Men 50 plus: on Monday, Wednesday, Friday (dialysis days): take one tablet daily with breakfast; on Sunday, Tuesday, Thursday, Saturday: take one tablet daily with supper   Yes [provider]  omeprazole (FIRST-OMEPRAZOLE) 2 mg/mL SUSP oral suspension Place 40 mg into feeding tube daily.   Yes [provider]  ondansetron (ZOFRAN) 4 MG tablet Place 4 mg into feeding tube every 6 (six) hours as needed for nausea or vomiting.   Yes [provider]  pravastatin (PRAVACHOL) 10 MG tablet Place 10 mg into feeding tube at bedtime. 03/16/20  Yes [provider]  predniSONE (DELTASONE) 5 MG tablet Take 1 tablet (5 mg total) by G tube once daily . Crush  and administer via GTube daily. 09/16/20 09/16/21 Yes [provider]  sulfamethoxazole-trimethoprim (BACTRIM,SEPTRA) 400-80 MG tablet Place 1 tablet into feeding tube See admin instructions. On Monday, Wednesday, Friday (dialysis days): take one tablet by mouth daily with supper 02/27/17  Yes [provider]  tuberculin 5 UNIT/0.1ML injection Inject 0.1 mLs into the skin See admin instructions. Inject 0.1 mL every evening shift every 7 days for TB screening for 2 Administrations Document on Immunization Record. Also schedule "Two Step PPD read" to start 2 days after first administration of PPD.   Yes [provider]  valganciclovir (VALCYTE) 50 MG/ML SOLR Take 450 mg by mouth See admin instructions. Take one tablet (450 mg) by mouth on Monday, Wednesday, and Friday after dialysis 03/26/15  Yes [provider]  Vancomycin HCl 50 MG/ML SOLR Take 2.5 mLs (125 mg total) by tube every 12 hours for 13 days. Last dose on 09/27/20. 09/15/20 09/29/20 Yes [provider]  vitamin B-6 (PYRIDOXINE) 25 MG tablet Place 25 mg into feeding tube daily with breakfast.   Yes [provider]  ACCU-CHEK FASTCLIX LANCETS MISC "As directed up to 4 times daily" 12/16/15   [provider]  Alpha Lipoic Acid 200 MG CAPS Take 200-400 mg by mouth See admin instructions.  On Monday, Wednesday, Friday (dialysis days): take 2 capsules (400 mg) by mouth with breakfast and 1 capsule (200 mg) with supper; on Sunday, Tuesday, Thursday, Saturday (non-dialysis days): take 1 capsule (200 mg) three times daily with meals Patient not taking: Reported on 09/26/2020    [provider]  amoxicillin-clavulanate (AUGMENTIN) 875-125 MG tablet TAKE 1 TABLET BY MOUTH TWO TIMES DAILY FOR 7 DAYS. Patient not taking: Reported on 09/26/2020 07/21/20 07/21/21  [name], MD  azaTHIOprine (IMURAN) 50 MG tablet Take 50 mg by mouth See admin instructions. Take one tablet (50 mg) by mouth on  Monday, Wednesday, Friday after dialysis Patient not taking: Reported on 09/26/2020    [provider]  calcium acetate (PHOSLO) 667 MG capsule Take 667 mg by mouth 3 (three) times daily before meals.  Patient not taking: Reported on 09/26/2020    [provider]  fluorometholone (FML) 0.1 % ophthalmic suspension Place 1 drop into the right eye 2 (two) times daily. Patient not taking: Reported on 09/26/2020 04/11/20   [provider]  gabapentin (NEURONTIN) 100 MG capsule Take 100 mg by mouth at bedtime. Patient not taking: Reported on 09/26/2020 03/25/20   [provider]  Immune Globulin 10% (PRIVIGEN) 20 GM/200ML SOLN Inject 0.5 g/kg into the vein every 3 (three) months.  Patient not taking: Reported on 09/26/2020    [provider]  insulin regular (NOVOLIN R) 100 units/mL injection Inject 5-9 Units into the skin See admin instructions. Inject 5 units subcutaneously with breakfast and 6 units with lunch and supper; PLUS adjustment per sliding scale: CBG [phone] add 1 unit, 261-360 add 2 units, 361-461 add 3 units Patient not taking: No sig reported    [provider]  midodrine (PROAMATINE) 5 MG tablet Take 5-10 mg by mouth See admin instructions. On Monday, Wednesday, Friday(dialysis days): take 1 tablet (5 mg) by mouth before dialysis, take 1 tablet (5 mg) after dialysis as needed for SBP >90. On Sunday, Tuesday, Thursday, Saturday (non-dialysis days): take 1 tablet (5 mg) by mouth 3 times daily as needed for SBP >90. Patient not taking: No sig reported 11/18/19   [provider]  omeprazole (PRILOSEC) 40 MG capsule Take 40 mg by mouth 2 (two) times daily with a meal.  Patient not taking: Reported on 09/26/2020 11/12/18   [provider]  polyvinyl alcohol-povidone (HYPOTEARS) 1.4-0.6 % ophthalmic solution Place 1 drop into both eyes at bedtime. Patient not taking: Reported on 09/26/2020    [provider]  tizanidine  (ZANAFLEX) 2 MG capsule Take 1 capsule (2 mg total) by mouth at bedtime as needed for muscle spasms. Patient not taking: No sig reported 07/06/20   Hans Eden, NP  vancomycin (VANCOCIN) 125 MG capsule TAKE 1 CAPSULE (125 MG TOTAL) BY MOUTH FOUR TIMES DAILY FOR 14 DAYS. THROUGH 05/05/2020 Patient not taking: Reported on 09/26/2020 04/27/20 04/27/21  Arrien, Jimmy Picket, MD   Physical Exam: Vitals:   09/26/20 2200 09/26/20 2230 09/26/20 2259 09/26/20 2307  BP: 117/78 101/72 112/88   Pulse: 75 76 75   Resp: 20 18 16    Temp:   98 F (36.7 C)   TempSrc:      SpO2: 100% 100% 100%   Weight:    61.5 kg  Height:       Constitutional: appears age-appropriate, frail, malnourished, NAD, calm, comfortable Eyes: PERRL, lids and conjunctivae normal ENMT: Mucous membranes are moist. Posterior pharynx clear of any exudate or lesions. Age-appropriate dentition. Hearing  appropriate Neck: normal, supple, no masses, no thyromegaly Respiratory: clear to auscultation bilaterally, no wheezing, no crackles. Normal respiratory effort. No accessory muscle use.  Cardiovascular: Regular rate and rhythm, no murmurs / rubs / gallops. No extremity edema. 2+ pedal pulses. No carotid bruits.  Abdomen: no tenderness, no masses palpated, no hepatosplenomegaly. Bowel sounds positive.  Feeding tube in place Musculoskeletal: no clubbing / cyanosis. No joint deformity upper and lower extremities. Good ROM, no contractures, no atrophy. Normal muscle tone.  Skin: no rashes, lesions, ulcers. No induration Neurologic: Sensation intact. Strength 5/5 in all 4.  Psychiatric: Normal judgment and insight. Alert and oriented x 3. Normal mood.   EKG: independently reviewed, showing sinus rhythm with rate of 79, QTc 539  Chest x-ray on Admission: Not indicated  Labs on Admission: I have personally reviewed following labs  CBC: Recent Labs  Lab 09/26/20 1852 09/26/20 2259  WBC 7.2 7.1  NEUTROABS 5.9  --   HGB 6.6*  8.0*  HCT 20.9* 25.0*  MCV 107.7* 102.5*  PLT 172 219   Basic Metabolic Panel: Recent Labs  Lab 09/26/20 1852  NA 138  K 3.2*  CL 95*  CO2 30  GLUCOSE 252*  BUN 27*  CREATININE 3.68*  CALCIUM 8.6*   GFR: Estimated Creatinine Clearance: 14.8 mL/min (A) (by C-G formula based on SCr of 3.68 mg/dL (H)).  Liver Function Tests: Recent Labs  Lab 09/26/20 1852  AST 70*  ALT 43  ALKPHOS 669*  BILITOT 1.8*  PROT 6.5  ALBUMIN 2.0*   Coagulation Profile: Recent Labs  Lab 09/26/20 1852  INR 1.0   Anemia Panel: Recent Labs    09/26/20 1852  FERRITIN 2,312*  TIBC 200*  IRON 44*  RETICCTPCT 4.3*   Urine analysis:    Component Value Date/Time   COLORURINE AMBER (A) 12/31/2015 0515   APPEARANCEUR CLOUDY (A) 12/31/2015 0515   LABSPEC 1.016 12/31/2015 0515   PHURINE 7.0 12/31/2015 0515   GLUCOSEU NEGATIVE 12/31/2015 0515   HGBUR NEGATIVE 12/31/2015 0515   BILIRUBINUR SMALL (A) 12/31/2015 0515   KETONESUR 15 (A) 12/31/2015 0515   PROTEINUR 100 (A) 12/31/2015 0515   UROBILINOGEN 2.0 (H) 08/15/2013 1916   NITRITE NEGATIVE 12/31/2015 0515   LEUKOCYTESUR NEGATIVE 12/31/2015 0515   Dejean Tribby N Meldon Hanzlik D.O. [facility]  If 7PM-7AM, please contact overnight-coverage provider If 7AM-7PM, please contact day coverage provider [email or web address]  09/27/2020, 1:48 AM

## 2020-09-27 NOTE — Consult Note (Signed)
[name] , MD [address], [place], [place], [place], [address] [address] [place], [place], [place], [address] Phone: [phone]  Fax: [phone]  Consultation  Referring Provider:   Dr Posey Pronto  Primary Care Physician:  Wenda Low, MD Primary Gastroenterologist:  [facility]   Reason for Consultation:     Anemia   Date of Admission:  09/26/2020 Date of Consultation:  09/27/2020         HPI:   [name] is a 70 y.o. male with a past medical history of interstitial lung disease these and transplant in 2016, ESRD was last seen by the gastroenterologist back in 2019 at [facility].  History of jackhammer esophagus.  Being evaluated for chronic belching and hiccups in the past.  Noted to have constipation as well.  Colonoscopy was in 2016 poor prep.  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  INR 1.0, CMP demonstrates creatinine of 3.68 alkaline phosphatase of 669 and total bilirubin of 1.8, BUN of 27 at baseline.  Iron studies demonstrate an iron of 44 TIBC of 200.  Ferritin of 2312,, Reticulocyte count elevated.  This morning hemoglobin is 8 g.  2 months back hemoglobin is 10.9 g.  He is on Imuran.   07/18/2020 CT angiogram of chest PE protocol demonstrated probable cirrhosis and cholelithiasis.Patient is on Imuran ultrasound abdomen right upper quadrant in November 2021 shows cirrhotic liver.    Denies any hematemesis , melena or GI loses. No NSAID use. Last week had a large nose bleed and lost lots of blood per patient from IV acess during dialysis.  Past Medical History:  Diagnosis Date  . Aortic valve disorders   . Benign neoplasm of colon   . Degeneration of intervertebral disc, site unspecified   . Diabetes mellitus without  complication ([place])   . 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 Esophageal reflux   . ESRD (end stage renal disease) on dialysis ([place])   . Essential hypertension   . Obstructive sleep apnea (adult) (pediatric)   . Osteoarthrosis, unspecified whether generalized or localized, unspecified site   . Other and unspecified hyperlipidemia   . Pneumonia    interstitial pneumonia  . Pulmonary fibrosis ([place])   . Renal disorder   . Respiratory failure with hypoxia ([place]) 12/2015  . Transplanted, lung ([place])   . Unspecified essential hypertension     Past Surgical History:  Procedure Laterality Date  . COLONOSCOPY WITH PROPOFOL N/A 11/07/2017   Procedure: COLONOSCOPY WITH PROPOFOL;  Surgeon: Ronnette Juniper, MD;  Location: [facility];  Service: Gastroenterology;  Laterality: N/A;  . LIGATION OF ARTERIOVENOUS  FISTULA Left 01/14/2019   Procedure: LIGATION OF ARTERIOVENOUS  FISTULA;  Surgeon: Marty Heck, MD;  Location: [facility];  Service: Vascular;  Laterality: Left;  . LUNG BIOPSY  2010  . LUNG TRANSPLANT, SINGLE Right   . POLYPECTOMY  11/07/2017   Procedure: POLYPECTOMY;  Surgeon: Ronnette Juniper, MD;  Location: [facility];  Service: Gastroenterology;;    Prior to Admission medications   Medication Sig Start Date End Date Taking? Authorizing Provider  acetaminophen (TYLENOL) 160 MG/5ML suspension Place 649.6 mg into feeding tube every 6 (six) hours as needed. 09/15/20  Yes [provider]  albuterol (VENTOLIN HFA) 108 (90 Base) MCG/ACT inhaler Inhale 1-2 puffs into the lungs daily as needed for wheezing or shortness of breath. 05/12/20  Yes [provider]  aspirin 81 MG tablet Take 81 mg by mouth daily with breakfast.   Yes [provider]  cycloSPORINE (GENGRAF) 100 MG/ML microemulsion solution Take 100 mg by mouth every 12 (twelve) hours.   Yes [provider]  dorzolamidel-timolol (COSOPT PF) 22.3-6.8 MG/ML SOLN ophthalmic solution  Place 1 drop into both eyes 2 (two) times daily.   Yes [provider]  fluticasone (FLONASE) 50 MCG/ACT nasal spray Place 2 sprays into the nose daily. Patient taking differently: Place 2 sprays into the nose daily as needed for allergies. 03/05/13  Yes Wenda Low, MD  heparin 5000 UNIT/ML injection Inject 1 mL into the skin every 12 (twelve) hours. 09/15/20  Yes [provider]  insulin regular (NOVOLIN R) 100 units/mL injection Inject 6 Units into the skin 2 (two) times daily. Give in addition to any sliding scale insulin that is needed   Yes [provider]  insulin regular (NOVOLIN R) 100 units/mL injection Inject 8 Units into the skin 2 (two) times daily. Give in addition to any sliding scale insulin that is needed.   Yes [provider]  insulin regular (NOVOLIN R) 100 units/mL injection Inject into the skin See admin instructions. Inject per sliding scale: if 0-199=0; 200-250=1; 251-300=2; 301-350=3; 351-400=4 and call MD, subcutaneously four times a day. Give in addition to schedule amount.   Yes [provider]  lidocaine (LIDODERM) 5 % Place 1 patch onto the skin daily. Place 1 patch onto the skin daily for 30 days Apply patch to the most painful area for up to 12 hours in a 24 hour period. 09/16/20 10/16/20 Yes [provider]  Loperamide HCl 1 MG/7.5ML LIQD Take 15 mLs (2 mg total) by G tube 3 (three) times daily as needed for Diarrhea 09/15/20  Yes [provider]  midodrine (PROAMATINE) 10 MG tablet Place 10 mg into feeding tube daily as needed (for hypotension prior to PT/OT PRN for BP).   Yes [provider]  midodrine (PROAMATINE) 10 MG tablet Place 10 mg into feeding tube 2 (two) times daily. Give before dialysis   Yes [provider]  Multiple Vitamin (MULTIVITAMIN WITH MINERALS) TABS tablet Place 1 tablet into feeding tube daily. Centrum for Men 50 plus: on Monday, Wednesday, Friday (dialysis days): take  one tablet daily with breakfast; on Sunday, Tuesday, Thursday, Saturday: take one tablet daily with supper   Yes [provider]  omeprazole (FIRST-OMEPRAZOLE) 2 mg/mL SUSP oral suspension Place 40 mg into feeding tube daily.   Yes [provider]  ondansetron (ZOFRAN) 4 MG tablet Place 4 mg into feeding tube every 6 (six) hours as needed for nausea or vomiting.   Yes [provider]  pravastatin (PRAVACHOL) 10 MG tablet Place 10 mg into feeding tube at bedtime. 03/16/20  Yes [provider]  predniSONE (DELTASONE) 5 MG tablet Take 1 tablet (5 mg total) by G tube once daily . Crush and administer via GTube daily. 09/16/20 09/16/21 Yes [provider]  sulfamethoxazole-trimethoprim (BACTRIM,SEPTRA) 400-80 MG tablet Place 1 tablet into feeding tube See admin instructions. On Monday, Wednesday, Friday (dialysis days): take one tablet by mouth daily with supper 02/27/17  Yes [provider]  tuberculin 5 UNIT/0.1ML injection Inject 0.1 mLs into the skin See admin instructions. Inject 0.1 mL every evening shift every 7  days for TB screening for 2 Administrations Document on Immunization Record. Also schedule "Two Step PPD read" to start 2 days after first administration of PPD.   Yes [provider]  valganciclovir (VALCYTE) 50 MG/ML SOLR Take 450 mg by mouth See admin instructions. Take one tablet (450 mg) by mouth on Monday, Wednesday, and Friday after dialysis 03/26/15  Yes [provider]  Vancomycin HCl 50 MG/ML SOLR Take 2.5 mLs (125 mg total) by tube every 12 hours for 13 days. Last dose on 09/27/20. 09/15/20 09/29/20 Yes [provider]  vitamin B-6 (PYRIDOXINE) 25 MG tablet Place 25 mg into feeding tube daily with breakfast.   Yes [provider]  ACCU-CHEK FASTCLIX LANCETS MISC "As directed up to 4 times daily" 12/16/15   [provider]  Alpha Lipoic Acid 200 MG CAPS Take 200-400 mg by mouth See admin  instructions. On Monday, Wednesday, Friday (dialysis days): take 2 capsules (400 mg) by mouth with breakfast and 1 capsule (200 mg) with supper; on Sunday, Tuesday, Thursday, Saturday (non-dialysis days): take 1 capsule (200 mg) three times daily with meals Patient not taking: Reported on 09/26/2020    [provider]  amoxicillin-clavulanate (AUGMENTIN) 875-125 MG tablet TAKE 1 TABLET BY MOUTH TWO TIMES DAILY FOR 7 DAYS. Patient not taking: Reported on 09/26/2020 07/21/20 07/21/21  [name], MD  azaTHIOprine (IMURAN) 50 MG tablet Take 50 mg by mouth See admin instructions. Take one tablet (50 mg) by mouth on Monday, Wednesday, Friday after dialysis Patient not taking: Reported on 09/26/2020    [provider]  calcium acetate (PHOSLO) 667 MG capsule Take 667 mg by mouth 3 (three) times daily before meals.  Patient not taking: Reported on 09/26/2020    [provider]  fluorometholone (FML) 0.1 % ophthalmic suspension Place 1 drop into the right eye 2 (two) times daily. Patient not taking: Reported on 09/26/2020 04/11/20   [provider]  gabapentin (NEURONTIN) 100 MG capsule Take 100 mg by mouth at bedtime. Patient not taking: Reported on 09/26/2020 03/25/20   [provider]  Immune Globulin 10% (PRIVIGEN) 20 GM/200ML SOLN Inject 0.5 g/kg into the vein every 3 (three) months.  Patient not taking: Reported on 09/26/2020    [provider]  insulin regular (NOVOLIN R) 100 units/mL injection Inject 5-9 Units into the skin See admin instructions. Inject 5 units subcutaneously with breakfast and 6 units with lunch and supper; PLUS adjustment per sliding scale: CBG [phone] add 1 unit, 261-360 add 2 units, 361-461 add 3 units Patient not taking: No sig reported    [provider]  midodrine (PROAMATINE) 5 MG tablet Take 5-10 mg by mouth See admin instructions. On Monday, Wednesday, Friday(dialysis days): take 1 tablet (5 mg) by mouth before  dialysis, take 1 tablet (5 mg) after dialysis as needed for SBP >90. On Sunday, Tuesday, Thursday, Saturday (non-dialysis days): take 1 tablet (5 mg) by mouth 3 times daily as needed for SBP >90. Patient not taking: No sig reported 11/18/19   [provider]  omeprazole (PRILOSEC) 40 MG capsule Take 40 mg by mouth 2 (two) times daily with a meal.  Patient not taking: Reported on 09/26/2020 11/12/18   [provider]  polyvinyl alcohol-povidone (HYPOTEARS) 1.4-0.6 % ophthalmic solution Place 1 drop into both eyes at bedtime. Patient not taking: Reported on 09/26/2020    [provider]  tizanidine (ZANAFLEX) 2 MG capsule Take 1 capsule (2 mg total) by mouth at bedtime as  needed for muscle spasms. Patient not taking: No sig reported 07/06/20   Hans Eden, NP  vancomycin (VANCOCIN) 125 MG capsule TAKE 1 CAPSULE (125 MG TOTAL) BY MOUTH FOUR TIMES DAILY FOR 14 DAYS. THROUGH 05/05/2020 Patient not taking: Reported on 09/26/2020 04/27/20 04/27/21  Arrien, Jimmy Picket, MD    Family History  Problem Relation Age of Onset  . Pancreatic cancer Brother   . Heart disease Father      Social History   Tobacco Use  . Smoking status: Former Smoker    Packs/day: 0.30    Years: 20.00    Pack years: 6.00    Types: Cigarettes    Quit date: 06/06/2002    Years since quitting: 18.3  . Smokeless tobacco: Never Used  Vaping Use  . Vaping Use: Never used  Substance Use Topics  . Alcohol use: No    Alcohol/week: 0.0 standard drinks  . Drug use: No    Allergies as of 09/26/2020 - Review Complete 09/26/2020  Allergen Reaction Noted  . Cefepime Other (See Comments) 08/02/2020  . Levofloxacin Other (See Comments)   . Nsaids Other (See Comments) 04/18/2015  . Tolmetin  09/26/2020    Review of Systems:    All systems reviewed and negative except where noted in HPI.   Physical Exam:  Vital signs in last 24 hours: Temp:  [97.6 F (36.4 C)-98.1 F (36.7 C)] 97.8 F (36.6  C) (04/24 1158) Pulse Rate:  [70-80] 70 (04/24 1158) Resp:  [14-23] 14 (04/24 1158) BP: (101-132)/(40-88) 108/40 (04/24 1158) SpO2:  [97 %-100 %] 100 % (04/24 1158) Weight:  [58.1 kg-61.5 kg] 61.5 kg (04/23 2307) Last BM Date: 09/26/20 General:   Pleasant, cooperative in NAD Head:  Normocephalic and atraumatic. Eyes:   No icterus.   Conjunctiva pink. PERRLA. Lungs: Respirations even and unlabored. Lungs clear to auscultation bilaterally.   No wheezes, crackles, or rhonchi.  Heart:  Regular rate and rhythm;  Without murmur, clicks, rubs or gallops Abdomen:  Soft, nondistended, nontender. Normal bowel sounds. No appreciable masses or hepatomegaly.  No rebound or guarding.  Neurologic:  Alert and oriented x3;  grossly normal neurologically. Psych:  Alert and cooperative. Normal affect.  LAB RESULTS: Recent Labs    09/26/20 1852 09/26/20 2259 09/27/20 0502  WBC 7.2 7.1 6.0  HGB 6.6* 8.0* 7.9*  HCT 20.9* 25.0* 23.6*  PLT 172 169 170   BMET Recent Labs    09/26/20 1852 09/27/20 0502  NA 138 140  K 3.2* 3.3*  CL 95* 97*  CO2 30 28  GLUCOSE 252* 119*  BUN 27* 34*  CREATININE 3.68* 4.15*  CALCIUM 8.6* 8.6*   LFT Recent Labs    09/26/20 1852  PROT 6.5  ALBUMIN 2.0*  AST 70*  ALT 43  ALKPHOS 669*  BILITOT 1.8*   PT/INR Recent Labs    09/26/20 1852  LABPROT 13.2  INR 1.0    STUDIES: DG Chest Port 1 View  Result Date: 09/27/2020 CLINICAL DATA:  Anemia EXAM: PORTABLE CHEST 1 VIEW COMPARISON:  09/18/2020 FINDINGS: There is fibrotic change in the left lung with unchanged left apical opacity. Right IJ approach central venous catheter tip is in unchanged position. Small right pleural effusion. IMPRESSION: 1. Unchanged appearance of the chest with fibrotic changes in the left lung. Electronically Signed   By: Ulyses Jarred M.D.   On: 09/27/2020 02:26      Impression / Plan:   [name] is a 70 y.o. y/o male  with a complex history including lung transplant,  rejection, CMV viremia, end-stage renal disease presented to hospital with a low hemoglobin.The patient is on immunosuppression with Imuran, anemia is macrocytic, iron studies show features of chronic disease.  There is no iron deficiency.   With no overt GI blood loss seen at home or at the hospital I would not attribute this to GI blood loss that would require any urgent evaluation. Very likely anemia due to nose bleed and blood loss from IV acess during dialysis.    Impression: Anemia likely multifactorial related to anemia of chronic disease, effect of azathioprine,nose bleed and blood loss from IV acess during dialysis as per patients history .    Plan 1.  Agree with plan to conservatively treat with blood transfusion, Close follow-up with his primary care physician/transplant center, GI follow-up at [facility] as he probably has cirrhosis.  Platelet count is within normal range.  2.  If endoscopy is considered down the road I would strongly suggest it to be performed at [facility] as he is extremely high risk to be performed at our hospital.  He has had complications after his lung transplant which could make induction of anesthesia or any possible complications harder to handle at our place.   I will sign off.  Please call me if any further GI concerns or questions.  We would like to thank you for the opportunity to participate in the care of [place].    Thank you for involving me in the care of this patient.      LOS: 0 days   Jonathan Bellows, MD  09/27/2020, 12:30 PM

## 2020-09-28 DIAGNOSIS — N186 End stage renal disease: Secondary | ICD-10-CM | POA: Diagnosis not present

## 2020-09-28 DIAGNOSIS — D649 Anemia, unspecified: Secondary | ICD-10-CM | POA: Diagnosis not present

## 2020-09-28 DIAGNOSIS — D500 Iron deficiency anemia secondary to blood loss (chronic): Secondary | ICD-10-CM | POA: Diagnosis not present

## 2020-09-28 DIAGNOSIS — R531 Weakness: Secondary | ICD-10-CM | POA: Diagnosis not present

## 2020-09-28 DIAGNOSIS — I10 Essential (primary) hypertension: Secondary | ICD-10-CM | POA: Diagnosis not present

## 2020-09-28 LAB — GLUCOSE, CAPILLARY: Glucose-Capillary: 152 mg/dL — ABNORMAL HIGH (ref 70–99)

## 2020-09-28 MED ORDER — ALUM & MAG HYDROXIDE-SIMETH 200-200-20 MG/5ML PO SUSP
15.0000 mL | Freq: Four times a day (QID) | ORAL | Status: DC | PRN
  Filled 2020-09-28: qty 30

## 2020-09-28 MED ORDER — LIDOCAINE 5 % EX PTCH
1.0000 | MEDICATED_PATCH | CUTANEOUS | Status: DC
  Administered 2020-09-28: 17:00:00 1 via TRANSDERMAL
  Filled 2020-09-28: qty 1

## 2020-09-28 MED ORDER — HEPARIN SODIUM (PORCINE) 1000 UNIT/ML DIALYSIS
1000.0000 [IU] | INTRAMUSCULAR | Status: DC | PRN
  Filled 2020-09-28: qty 1

## 2020-09-28 MED ORDER — HEPARIN SODIUM (PORCINE) 1000 UNIT/ML DIALYSIS
100.0000 [IU]/kg | INTRAMUSCULAR | Status: DC | PRN
  Filled 2020-09-28: qty 7

## 2020-09-28 MED ORDER — NEPRO/CARBSTEADY PO LIQD: 1000.0000 mL | ORAL | 0 refills | Status: DC

## 2020-09-28 MED ORDER — LIDOCAINE HCL (PF) 1 % IJ SOLN
5.0000 mL | INTRAMUSCULAR | Status: DC | PRN
  Filled 2020-09-28: qty 5

## 2020-09-28 MED ORDER — SODIUM CHLORIDE 0.9 % IV SOLN: 100.0000 mL | INTRAVENOUS | Status: DC | PRN

## 2020-09-28 MED ORDER — PENTAFLUOROPROP-TETRAFLUOROETH EX AERO
1.0000 "application " | INHALATION_SPRAY | CUTANEOUS | Status: DC | PRN
  Filled 2020-09-28: qty 30

## 2020-09-28 MED ORDER — LIDOCAINE-PRILOCAINE 2.5-2.5 % EX CREA
1.0000 "application " | TOPICAL_CREAM | CUTANEOUS | Status: DC | PRN
  Filled 2020-09-28: qty 5

## 2020-09-28 MED ORDER — NEPRO/CARBSTEADY PO LIQD: 1000.0000 mL | ORAL | Status: DC

## 2020-09-28 MED ORDER — ALTEPLASE 2 MG IJ SOLR
2.0000 mg | Freq: Once | INTRAMUSCULAR | Status: DC | PRN
  Filled 2020-09-28: qty 2

## 2020-09-28 NOTE — Discharge Instructions (Signed)
[facility] transplant service on your appointments that have already been scheduled

## 2020-09-28 NOTE — TOC Transition Note (Signed)
Transition of Care [facility]) - CM/SW Discharge Note   Patient Details  Name: JAYANTH SZCZESNIAK MRN: [id number] Date of Birth: 1951-01-06  Transition of Care [facility]) CM/SW Contact:  Shelbie Hutching, RN Phone Number: 09/28/2020, 12:58 PM   Clinical Narrative:    Patient will discharge to [facility], going to room 16A.  Patient is currently getting dialysis treatment. Once dialysis is completed bedside Rn will call report and RNCM will arrange transport.    Final next level of care: [place] Barriers to Discharge: No Barriers Identified   Patient Goals and CMS Choice Patient states their goals for this hospitalization and ongoing recovery are:: To return to [facility] for Rehab CMS Medicare.gov Compare Post Acute Care list provided to:: Patient Choice offered to / list presented to : [facility]  Discharge Placement   Existing PASRR number confirmed : 09/28/20          Patient chooses bed at: [facility] Patient to be transferred to facility by: [facility] Name of family member notified: Molli Posey Patient and family notified of of transfer: 09/28/20  Discharge Plan and Services   Discharge Planning Services: CM Consult Post Acute Care Choice: [place]          DME Arranged: N/A DME Agency: NA       HH Arranged: NA          Social Determinants of Health (SDOH) Interventions     Readmission Risk Interventions No flowsheet data found.

## 2020-09-28 NOTE — TOC Progression Note (Signed)
Transition of Care [facility]) - Progression Note    Patient Details  Name: [name] MRN: [id number] Date of Birth: 05/16/1951  Transition of Care [facility]) CM/SW Contact  Shelbie Hutching, RN Phone Number: 09/28/2020, 4:12 PM  Clinical Narrative:    Patient has completed dialysis.  Patient will be going back to [facility] room 16A.  Bedside RN will call report.  RNCM has arranged EMS transport - patient 3rd in line for pickup.     Expected Discharge Plan: [place] Barriers to Discharge: No Barriers Identified  Expected Discharge Plan and Services Expected Discharge Plan: [place]   Discharge Planning Services: CM Consult Post Acute Care Choice: [place] Living arrangements for the past 2 months: Single Family Home Expected Discharge Date: 09/28/20               DME Arranged: N/A DME Agency: NA       HH Arranged: NA           Social Determinants of Health (SDOH) Interventions    Readmission Risk Interventions No flowsheet data found.

## 2020-09-28 NOTE — TOC Initial Note (Signed)
Transition of Care [facility]) - Initial/Assessment Note    Patient Details  Name: [name] MRN: [id number] Date of Birth: 09-24-1950  Transition of Care [facility]) CM/SW Contact:    Shelbie Hutching, RN Phone Number: 09/28/2020, 10:57 AM  Clinical Narrative:                 Patient placed under observation for low hemoglobin requiring transfusion.  Patient is medically cleared for discharge today back to [facility] to continue short term rehab.  RNCM spoke with patient's wife and she is aware of discharge today.  Patient will discharge back after dialysis completed.  RNCM will arrange transport.   Expected Discharge Plan: [place] Barriers to Discharge: No Barriers Identified   Patient Goals and CMS Choice Patient states their goals for this hospitalization and ongoing recovery are:: To return to [facility] for Rehab CMS Medicare.gov Compare Post Acute Care list provided to:: Patient Choice offered to / list presented to : [facility]  Expected Discharge Plan and Services Expected Discharge Plan: [place]   Discharge Planning Services: CM Consult Post Acute Care Choice: [place] Living arrangements for the past 2 months: Single Family Home Expected Discharge Date: 09/28/20               DME Arranged: N/A DME Agency: NA       HH Arranged: NA          Prior Living Arrangements/Services Living arrangements for the past 2 months: Single Family Home Lives with:: Spouse Patient language and need for interpreter reviewed:: Yes Do you feel safe going back to the place where you live?: Yes      Need for Family Participation in Patient Care: Yes (Comment) (dialysis patient, hx lung transplant) Care giver support system in place?: Yes (comment)   Criminal Activity/Legal Involvement Pertinent to Current Situation/Hospitalization: No - Comment as needed  Activities of Daily Living Home Assistive  Devices/Equipment: Wheelchair,Reacher,Oxygen,Grab bars in shower,Grab bars around toilet,Eyeglasses ADL Screening (condition at time of admission) Patient's cognitive ability adequate to safely complete daily activities?: Yes Is the patient deaf or have difficulty hearing?: No Does the patient have difficulty seeing, even when wearing glasses/contacts?: No Does the patient have difficulty concentrating, remembering, or making decisions?: No Patient able to express need for assistance with ADLs?: Yes Does the patient have difficulty dressing or bathing?: Yes Independently performs ADLs?: No Communication: Independent Dressing (OT): Needs assistance Is this a change from baseline?: Pre-admission baseline Grooming: Needs assistance Is this a change from baseline?: Pre-admission baseline Feeding: Independent Bathing: Needs assistance Is this a change from baseline?: Pre-admission baseline Toileting: Needs assistance Is this a change from baseline?: Pre-admission baseline In/Out Bed: Needs assistance Is this a change from baseline?: Pre-admission baseline Walks in Home: Needs assistance Is this a change from baseline?: Pre-admission baseline Does the patient have difficulty walking or climbing stairs?: Yes Weakness of Legs: Both Weakness of Arms/Hands: Both  Permission Sought/Granted Permission sought to share information with : [place] granted to share information with : Yes, Verbal Permission Granted  Share Information with NAME: Molli Posey  Permission granted to share info w AGENCY: [employer]  Permission granted to share info w Relationship: Wife     Emotional Assessment       Orientation: : Oriented to Self,Oriented to Place,Oriented to  Time,Oriented to Situation Alcohol / Substance Use: Not Applicable Psych Involvement: No (comment)  Admission diagnosis:  Anemia [D64.9]  Occult blood positive  stool [R19.5] ESRD (end stage renal disease) ([place]) [N18.6] Symptomatic anemia [D64.9] Patient Active Problem List   Diagnosis Date Noted  . Anemia 09/26/2020  . Exercise hypoxemia   . Pneumonia 07/17/2020  . Lung mass 07/17/2020  . Acute bronchiolitis due to human metapneumovirus 04/25/2020  . Sepsis with acute organ dysfunction ([place]) 01/08/2020  . Lactic acidosis 01/08/2020  . Mixed diabetic hyperlipidemia associated with type 2 diabetes mellitus ([place]) 01/08/2020  . Hepatic cirrhosis ([place]) 01/08/2020  . Uncontrolled type 2 diabetes mellitus with hyperglycemia, with long-term current use of insulin ([place]) 01/08/2020  . IV infiltrate, initial encounter 01/08/2020  . Acute diarrhea 01/08/2020  . Acute metabolic encephalopathy 50/56/9794  . 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 AV fistula infection, initial encounter ([place]) 01/14/2019  . Immunocompromised state ([place]) 01/14/2019  . Wound infection 01/13/2019  . Sepsis ([place])   . CKD (chronic kidney disease)   . Generalized weakness 12/30/2015  . ESRD (end stage renal disease) ([place])   . 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[place])   . Acute respiratory failure with hypoxemia ([place]) 04/18/2015  . Septic shock ([place]) 04/18/2015  . Acute encephalopathy 04/18/2015  . Acute respiratory failure with hypoxia ([place]) 04/18/2015  . Cardiac arrest ([place])   . HCAP (healthcare-associated pneumonia)   . Elevated rheumatoid factor 05/09/2014  . Essential hypertension 05/09/2014  . ILD (interstitial lung disease) ([place]) 05/09/2014  . Obstructive apnea 05/09/2014  . Lung nodule, solitary 04/18/2014  . Awaiting organ transplant 04/18/2014  . Acute on chronic respiratory failure with hypoxia ([place]) 08/15/2013  . Edema 07/05/2013  . Chronic respiratory failure ([place]) 03/03/2013  . Diabetes mellitus with complication ([place]) 80/16/5537  . Acute sinusitis 05/04/2011  . Cough 11/10/2010  . 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 Obstructive sleep  apnea 10/09/2008  . GERD without esophagitis 10/09/2008  . HIATAL HERNIA 10/09/2008  . OSTEOARTHRITIS 10/09/2008   PCP:  Wenda Low, MD Pharmacy:   [place], [place] - [address] [place] [address] Phone: [phone] Fax: [phone]  [facility] [id number] [place] [address] Phone: [phone] Fax: [place], [place] - [address] [place] [address] Phone: [phone] Fax: [phone]  [place], [place] [address] Phone: [phone] Fax: [phone]     Social Determinants of Health (SDOH) Interventions    Readmission Risk Interventions No flowsheet data found.

## 2020-09-28 NOTE — Progress Notes (Signed)
[facility], [place] 09/28/20  Subjective:   LOS: 0  Patient seen during dialysis Tolerating well    HEMODIALYSIS FLOWSHEET:  Blood Flow Rate (mL/min): 400 mL/min Arterial Pressure (mmHg): -170 mmHg Venous Pressure (mmHg): 210 mmHg Transmembrane Pressure (mmHg): 60 mmHg Ultrafiltration Rate (mL/min): 720 mL/min Dialysate Flow Rate (mL/min): 600 ml/min Conductivity: Machine : 14 Conductivity: Machine : 14 Dialysis Fluid Bolus: Normal Saline Bolus Amount (mL): 200 mL   Patient seen during dialysis treatment.  Tolerating well. Denies any nausea or vomiting Scheduled for discharge later today  Objective:  Vital signs in last 24 hours:  Temp:  [97.7 F (36.5 C)-98.6 F (37 C)] 98.2 F (36.8 C) (04/25 4580) Pulse Rate:  [77-91] 91 (04/25 1230) Resp:  [14-24] 24 (04/25 1230) BP: (115-157)/(57-83) 142/83 (04/25 1230) SpO2:  [100 %] 100 % (04/25 1230)  Weight change:  Filed Weights   09/26/20 1819 09/26/20 2307  Weight: 58.1 kg 61.5 kg    Intake/Output:    Intake/Output Summary (Last 24 hours) at 09/28/2020 1304 Last data filed at 09/28/2020 0956 Gross per 24 hour  Intake 120 ml  Output --  Net 120 ml    Physical Exam: General:  No acute distress, laying in the bed  HEENT  anicteric, moist oral mucous membrane  Pulm/lungs  normal breathing effort, lungs are clear to auscultation  CVS/Heart  regular rhythm, no rub or gallop  Abdomen:   Soft, nontender  Extremities:  No peripheral edema  Neurologic:  Alert, oriented, able to follow commands  Skin:  No acute rashes  AV fistula in place    Basic Metabolic Panel:  Recent Labs  Lab 09/26/20 1852 09/27/20 0502  NA 138 140  K 3.2* 3.3*  CL 95* 97*  CO2 30 28  GLUCOSE 252* 119*  BUN 27* 34*  CREATININE 3.68* 4.15*  CALCIUM 8.6* 8.6*     CBC: Recent Labs  Lab 09/26/20 1852 09/26/20 2259 09/27/20 0502  WBC 7.2 7.1 6.0  NEUTROABS 5.9  --   --   HGB 6.6* 8.0* 7.9*  HCT  20.9* 25.0* 23.6*  MCV 107.7* 102.5* 97.9  PLT 172 169 170      Lab Results  Component Value Date   HEPBSAG Negative 01/01/2016      Microbiology:  Recent Results (from the past 240 hour(s))  Resp Panel by RT-PCR (Flu A&B, Covid) Nasopharyngeal Swab     Status: None   Collection Time: 09/26/20  9:39 PM   Specimen: Nasopharyngeal Swab; Nasopharyngeal(NP) swabs in vial transport medium  Result Value Ref Range Status   SARS Coronavirus 2 by RT PCR NEGATIVE NEGATIVE Final    Comment: (NOTE) SARS-CoV-2 target nucleic acids are NOT DETECTED.  The SARS-CoV-2 RNA is generally detectable in upper respiratory specimens during the acute phase of infection. The lowest concentration of SARS-CoV-2 viral copies this assay can detect is 138 copies/mL. A negative result does not preclude SARS-Cov-2 infection and should not be used as the sole basis for treatment or other patient management decisions. A negative result may occur with  improper specimen collection/handling, submission of specimen other than nasopharyngeal swab, presence of viral mutation(s) within the areas targeted by this assay, and inadequate number of viral copies(<138 copies/mL). A negative result must be combined with clinical observations, patient history, and epidemiological information. The expected result is Negative.  Fact Sheet for Patients:  EntrepreneurPulse.com.au  Fact Sheet for Healthcare Providers:  IncredibleEmployment.be  This test is no t yet approved or cleared  by the [place] and  has been authorized for detection and/or diagnosis of SARS-CoV-2 by FDA under an Emergency Use Authorization (EUA). This EUA will remain  in effect (meaning this test can be used) for the duration of the COVID-19 declaration under Section 564(b)(1) of the Act, 21 U.S.C.section 360bbb-3(b)(1), unless the authorization is terminated  or revoked sooner.       Influenza A by  PCR NEGATIVE NEGATIVE Final   Influenza B by PCR NEGATIVE NEGATIVE Final    Comment: (NOTE) The Xpert Xpress SARS-CoV-2/FLU/RSV plus assay is intended as an aid in the diagnosis of influenza from Nasopharyngeal swab specimens and should not be used as a sole basis for treatment. Nasal washings and aspirates are unacceptable for Xpert Xpress SARS-CoV-2/FLU/RSV testing.  Fact Sheet for Patients: EntrepreneurPulse.com.au  Fact Sheet for Healthcare Providers: IncredibleEmployment.be  This test is not yet approved or cleared by the [place] FDA and has been authorized for detection and/or diagnosis of SARS-CoV-2 by FDA under an Emergency Use Authorization (EUA). This EUA will remain in effect (meaning this test can be used) for the duration of the COVID-19 declaration under Section 564(b)(1) of the Act, 21 U.S.C. section 360bbb-3(b)(1), unless the authorization is terminated or revoked.  Performed at [facility], [address] [place], [place] [address]   MRSA PCR Screening     Status: None   Collection Time: 09/27/20  4:30 AM   Specimen: Nasopharyngeal  Result Value Ref Range Status   MRSA by PCR NEGATIVE NEGATIVE Final    Comment:        The GeneXpert MRSA Assay (FDA approved for NASAL specimens only), is one component of a comprehensive MRSA colonization surveillance program. It is not intended to diagnose MRSA infection nor to guide or monitor treatment for MRSA infections. Performed at [facility], [address] [place], [place] [address]     Coagulation Studies: Recent Labs    09/26/20 1852  LABPROT 13.2  INR 1.0    Urinalysis: No results for input(s): COLORURINE, LABSPEC, PHURINE, GLUCOSEU, HGBUR, BILIRUBINUR, KETONESUR, PROTEINUR, UROBILINOGEN, NITRITE, LEUKOCYTESUR in the last 72 hours.  Invalid input(s): APPERANCEUR    Imaging: DG Chest Port 1 View  Result Date: 09/27/2020 CLINICAL  DATA:  Anemia EXAM: PORTABLE CHEST 1 VIEW COMPARISON:  09/18/2020 FINDINGS: There is fibrotic change in the left lung with unchanged left apical opacity. Right IJ approach central venous catheter tip is in unchanged position. Small right pleural effusion. IMPRESSION: 1. Unchanged appearance of the chest with fibrotic changes in the left lung. Electronically Signed   By: Ulyses Jarred M.D.   On: 09/27/2020 02:26     Medications:   . sodium chloride    . sodium chloride     . Chlorhexidine Gluconate Cloth  6 each Topical Q0600  . cycloSPORINE modified  100 mg Oral BID  . dorzolamide-timolol  1 drop Both Eyes BID  . feeding supplement (NEPRO CARB STEADY)  1,000 mL Per Tube Q24H  . lidocaine  1 patch Transdermal Q24H  . midodrine  10 mg Per Tube BID  . multivitamin  10 mL Oral Daily  . pantoprazole (PROTONIX) IV  40 mg Intravenous Q12H  . pravastatin  10 mg Oral QHS  . predniSONE  5 mg Per Tube Q breakfast  . vitamin B-6  25 mg Oral Q breakfast  . sulfamethoxazole-trimethoprim  1 tablet Oral Once per day on Mon Wed Fri  . valganciclovir  450 mg Oral Once per day  on Mon Wed Fri   sodium chloride, sodium chloride, acetaminophen **OR** acetaminophen, albuterol, alteplase, alum & mag hydroxide-simeth, fluticasone, heparin, heparin, lidocaine (PF), lidocaine-prilocaine, midodrine, ondansetron **OR** ondansetron (ZOFRAN) IV, pentafluoroprop-tetrafluoroeth  Assessment/ Plan:  70 y.o. male with interstitial lung disease, lung transplant 2016, chronic immunosuppression history of lung hematoma, end-stage renal disease, hyperlipidemia, protein malnutrition was admitted on 09/26/2020 for  Principal Problem:   Anemia Active Problems:   Obstructive sleep apnea   GERD without esophagitis   Essential hypertension   ILD (interstitial lung disease) (HCC)   ESRD (end stage renal disease) (HCC)   Generalized weakness   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[place])   Hepatic cirrhosis (HCC)   Lung mass  Anemia  [D64.9] Occult blood positive stool [R19.5] ESRD (end stage renal disease) ([place]) [N18.6] Symptomatic anemia [D64.9]  #. ESRD [facility]   180NRe, 4 hours, BFR 400, DFR 500 (due to dialyzer shortage),  EDW 58kg,   AVF 15g,  heparin 1800 unit bolus Hectorol 3 mcg IV q HD  #. Anemia of CKD  Lab Results  Component Value Date   HGB 7.9 (L) 09/27/2020   monitor  #. Secondary hyperparathyroidism of renal origin N 25.81   No results found for: PTH Lab Results  Component Value Date   PHOS 5.9 (H) 07/21/2020   Monitor calcium and phos level during this admission    LOS: [place] 4/25/20221:04 PM  [place], [place]

## 2020-09-28 NOTE — NC FL2 (Signed)
[place] LEVEL OF CARE SCREENING TOOL     IDENTIFICATION  Patient Name: [name] Birthdate: 1950/09/08 Sex: male Admission Date (Current Location): 09/26/2020  [place] and [place] Number:  Engineering geologist and Address:  [facility], [address] [place], [place] [address]      Provider Number: [id number]  Attending Physician Name and Address:  Fritzi Mandes, MD  Relative Name and Phone Number:  Sheriff Rodenberg (wife) [phone]    Current Level of Care: Hospital Recommended Level of Care: [place] Prior Approval Number:    Date Approved/Denied:   PASRR Number:    Discharge Plan: SNF    Current Diagnoses: Patient Active Problem List   Diagnosis Date Noted  . Anemia 09/26/2020  . Exercise hypoxemia   . Pneumonia 07/17/2020  . Lung mass 07/17/2020  . Acute bronchiolitis due to human metapneumovirus 04/25/2020  . Sepsis with acute organ dysfunction ([place]) 01/08/2020  . Lactic acidosis 01/08/2020  . Mixed diabetic hyperlipidemia associated with type 2 diabetes mellitus ([place]) 01/08/2020  . Hepatic cirrhosis ([place]) 01/08/2020  . Uncontrolled type 2 diabetes mellitus with hyperglycemia, with long-term current use of insulin ([place]) 01/08/2020  . IV infiltrate, initial encounter 01/08/2020  . Acute diarrhea 01/08/2020  . Acute metabolic encephalopathy 24/23/5361  . 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 AV fistula infection, initial encounter ([place]) 01/14/2019  . Immunocompromised state ([place]) 01/14/2019  . Wound infection 01/13/2019  . Sepsis ([place])   . CKD (chronic kidney disease)   . Generalized weakness 12/30/2015  . ESRD (end stage renal disease) ([place])   . 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[place])   . Acute respiratory failure with hypoxemia ([place]) 04/18/2015  . Septic shock ([place]) 04/18/2015  . Acute encephalopathy 04/18/2015  . Acute respiratory failure with hypoxia ([place]) 04/18/2015  . Cardiac arrest  ([place])   . HCAP (healthcare-associated pneumonia)   . Elevated rheumatoid factor 05/09/2014  . Essential hypertension 05/09/2014  . ILD (interstitial lung disease) ([place]) 05/09/2014  . Obstructive apnea 05/09/2014  . Lung nodule, solitary 04/18/2014  . Awaiting organ transplant 04/18/2014  . Acute on chronic respiratory failure with hypoxia ([place]) 08/15/2013  . Edema 07/05/2013  . Chronic respiratory failure ([place]) 03/03/2013  . Diabetes mellitus with complication ([place]) 44/31/5400  . Acute sinusitis 05/04/2011  . Cough 11/10/2010  . 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 Obstructive sleep apnea 10/09/2008  . GERD without esophagitis 10/09/2008  . HIATAL HERNIA 10/09/2008  . OSTEOARTHRITIS 10/09/2008    Orientation RESPIRATION BLADDER Height & Weight     Self,Time,Situation,Place  Normal Continent Weight: 61.5 kg Height:  5\' 3"  (160 cm)  BEHAVIORAL SYMPTOMS/MOOD NEUROLOGICAL BOWEL NUTRITION STATUS      Continent Diet (Heart Healthy/ carb modified)  AMBULATORY STATUS COMMUNICATION OF NEEDS Skin   Limited Assist Verbally Normal                       Personal Care Assistance Level of Assistance  Bathing,Feeding,Dressing Bathing Assistance: Limited assistance Feeding assistance: Limited assistance Dressing Assistance: Limited assistance     Functional Limitations Info             SPECIAL CARE FACTORS FREQUENCY  PT (By licensed PT),OT (By licensed OT)     PT Frequency: 5 times per week OT Frequency: 5 times per week            Contractures Contractures Info: Not present    Additional Factors Info  Code Status,Allergies  Code Status Info: Full Allergies Info: Cefepime, levofloxacinNSAIDS, Tolmetin           Current Medications (09/28/2020):  This is the current hospital active medication list Current Facility-Administered Medications  Medication Dose Route Frequency Provider Last Rate Last Admin  . 0.9 %  sodium chloride infusion  100 mL  Intravenous PRN Bhutani, Manpreet S, MD      . 0.9 %  sodium chloride infusion  100 mL Intravenous PRN Bhutani, Manpreet S, MD      . acetaminophen (TYLENOL) tablet 1,000 mg  1,000 mg Oral Q6H PRN Cox, Amy N, DO   1,000 mg at 09/27/20 0802   Or  . acetaminophen (TYLENOL) suppository 650 mg  650 mg Rectal Q6H PRN Cox, Amy N, DO      . albuterol (VENTOLIN HFA) 108 (90 Base) MCG/ACT inhaler 1-2 puff  1-2 puff Inhalation Daily PRN Cox, Amy N, DO      . alteplase (CATHFLO ACTIVASE) injection 2 mg  2 mg Intracatheter Once PRN Bhutani, Manpreet S, MD      . alum & mag hydroxide-simeth (MAALOX/MYLANTA) 200-200-20 MG/5ML suspension 15 mL  15 mL Oral Q6H PRN Fritzi Mandes, MD      . Chlorhexidine Gluconate Cloth 2 % PADS 6 each  6 each Topical Q0600 Bhutani, Manpreet S, MD      . cycloSPORINE modified (NEORAL) capsule 100 mg  100 mg Oral BID Cox, Amy N, DO   100 mg at 09/27/20 2154  . dorzolamide-timolol (COSOPT) 22.3-6.8 MG/ML ophthalmic solution 1 drop  1 drop Both Eyes BID Renda Rolls, RPH   1 drop at 09/27/20 2159  . fluticasone (FLONASE) 50 MCG/ACT nasal spray 2 spray  2 spray Each Nare Daily PRN Cox, Amy N, DO      . heparin injection 1,000 Units  1,000 Units Dialysis PRN Theador Hawthorne, Manpreet S, MD      . heparin injection 6,200 Units  100 Units/kg Dialysis PRN Bhutani, Manpreet S, MD      . lidocaine (LIDODERM) 5 % 1 patch  1 patch Transdermal Q24H Fritzi Mandes, MD      . lidocaine (PF) (XYLOCAINE) 1 % injection 5 mL  5 mL Intradermal PRN Bhutani, Manpreet S, MD      . lidocaine-prilocaine (EMLA) cream 1 application  1 application Topical PRN Bhutani, Manpreet S, MD      . midodrine (PROAMATINE) tablet 10 mg  10 mg Per Tube Daily PRN Cox, Amy N, DO      . midodrine (PROAMATINE) tablet 10 mg  10 mg Per Tube BID Cox, Amy N, DO   10 mg at 09/28/20 1045  . multivitamin liquid 10 mL  10 mL Oral Daily Cox, Amy N, DO   10 mL at 09/27/20 0806  . ondansetron (ZOFRAN) tablet 4 mg  4 mg Oral Q6H PRN Cox, Amy N,  DO       Or  . ondansetron (ZOFRAN) injection 4 mg  4 mg Intravenous Q6H PRN Cox, Amy N, DO      . pantoprazole (PROTONIX) injection 40 mg  40 mg Intravenous Q12H Cox, Amy N, DO   40 mg at 09/27/20 2154  . pentafluoroprop-tetrafluoroeth (GEBAUERS) aerosol 1 application  1 application Topical PRN Bhutani, Manpreet S, MD      . pravastatin (PRAVACHOL) tablet 10 mg  10 mg Oral QHS Cox, Amy N, DO   10 mg at 09/27/20 2154  . predniSONE (DELTASONE) tablet 5 mg  5 mg Per Tube  Q breakfast Cox, Amy N, DO   5 mg at 09/27/20 0802  . pyridOXINE (VITAMIN B-6) tablet 25 mg  25 mg Oral Q breakfast Cox, Amy N, DO   25 mg at 09/27/20 0805  . sulfamethoxazole-trimethoprim (BACTRIM) 400-80 MG per tablet 1 tablet  1 tablet Oral Once per day on Mon Wed Fri Patel, Sona, MD      . valganciclovir (VALCYTE) 50 MG/ML oral solution 450 mg  450 mg Oral Once per day on Mon Wed Fri Cox, Amy N, DO         Discharge Medications: Please see discharge summary for a list of discharge medications.  Relevant Imaging Results:  Relevant Lab Results:   Additional Information SS# [id number]  Shelbie Hutching, RN

## 2020-09-28 NOTE — Progress Notes (Signed)
Brief Nutrition Note  Pt recently dc from [facility] with PEG-J in place. Pt now on an oral diet and eating consistently. Pt reports at nursing facility, tube was still being used for nutrition. Attempted to call [facility] but staff was unable to come to the phone to provide details on pt's regimen. Pt being dc back to facility today, MD requests TF recommendations be entered to be utilized at facility upon dc. Noted that at dc from [facility], pt was receiving Novasource Renal continuously at 61mL/h with 18mL free water flushes q4h. Based on current intake and estimated nutrition needs, recommend the following:  Estimated nutrition needs: 2000-2200 kcal/d 100-110g of protein 1L+ UOP of fluid daily  Average Meal Intake: . 65% intake x 3 recorded meals (estimated to meet ~1050 kcal, 56g of protein based on standard tray)  Recommended TF Regimen at dc: Nocturnal tube feeding via PEG x 12 hours (8PM-8AM): Nepro or equivalent (Novasource renal) at 40 ml/h (960 ml per day) Provides 960 kcal, 44 gm protein, 344 ml free water daily  Ranell Patrick, RD, LDN Clinical Dietitian Pager on Amion

## 2020-09-28 NOTE — Discharge Summary (Signed)
[facility] [place] NAME: [name]    MR#:  [id number]  DATE OF BIRTH:  05-Apr-1951  DATE OF ADMISSION:  09/26/2020 ADMITTING PHYSICIAN: Amy N Cox, DO  DATE OF DISCHARGE: 09/28/2020  PRIMARY CARE PHYSICIAN: Wenda Low, MD    ADMISSION DIAGNOSIS:  Anemia [D64.9] Occult blood positive stool [R19.5] ESRD (end stage renal disease) ([place]) [N18.6] Symptomatic anemia [D64.9]  DISCHARGE DIAGNOSIS:  Acute on chronic anemia suspected due to recent Nose bleed--s/p BT ESRD on HD  SECONDARY DIAGNOSIS:   Past Medical History:  Diagnosis Date  . Aortic valve disorders   . Benign neoplasm of colon   . Degeneration of intervertebral disc, site unspecified   . Diabetes mellitus without complication ([place])   . 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 Esophageal reflux   . ESRD (end stage renal disease) on dialysis ([place])   . Essential hypertension   . Obstructive sleep apnea (adult) (pediatric)   . Osteoarthrosis, unspecified whether generalized or localized, unspecified site   . Other and unspecified hyperlipidemia   . Pneumonia    interstitial pneumonia  . Pulmonary fibrosis ([place])   . Renal disorder   . Respiratory failure with hypoxia ([place]) 12/2015  . Transplanted, lung ([place])   . Unspecified essential hypertension     HOSPITAL COURSE:   Jonathan Woolbright Uppermanis a 70 y.o.malewith medical history significant forinterstitial lung disease, status post complex, lung transplant in 2016, history of lung hematoma, end-stage renal disease on hemodialysis Monday Wednesday Friday, hyperlipidemia, protein malnutrition requiring tube feeding, debility, on chronic immunosuppression, presents to the emergency department for chief concerns of abnormal labs per facility  Asymptomatic severe anemia- suspect blood loss due to nasal bleed last week accordingly wife -- hemoglobin around 10 - came in with hemoglobin of  6.6--1 unit packed red blood cells-- 8.0 -- has multiple medical problems including end-stage renal disease contributing to anemia and recent nose bleed -- has history of duodenal ulcer however no active signs of bleeding. -- G.I. consultation with Dr. Vicente Males appreciated. Discussed with patient and patient's wife continue conservative management.He is at high risk given hsi lung transplant. No s/o active bleed. Cont ppi  History of chronic respiratory failure due to interstitial lung disease  status post lung transplant -resumed home cyclosporine 100 mg twice daily, Bactrim 1 tablet Monday Wednesday Friday, valganciclovir 450 mg Monday Wednesday Friday -Immunosuppressive medication, prophylactic medications resumed  -- Patient recently finished a course of antibiotic IV and oral. He was discharged from [facility] 14th April after being admitted for pneumonia --completed course of po vanc on 09/27/2020  Hyperlipidemia-pravastatin   GERD-Protonix  End-stage renal disease on hemodialysis  consult nephrology for dialysis resumption-- Monday Wednesday Friday Pt will get HD today  Chronic respiratory failure on home oxygen --Recently finished abxs for Aspiration PNA -- patient has been getting tube feedings. He was discharged from [facility] with two feeds. Exact details unavailable. Patient states he recently started eating soft diet and has been tolerating okay. -- Discussed with dietitian here and will continue to feedings through the night only and have dietitian follow-up rehab in order to evaluate and when deemed necessary can discontinue tube feed as long as oral intake is adequate.  DVT prophylaxis-pt has been on heparin sq bid since I believe his d/c from [facility] 09/17/2020. Will hold sq heparin for now given anemia nd BT and have MD at the facility recheck hgb in few days  and if stable and deemed necessary can resume heparin for DVT prophylaxis   Procedures: Family  communication : wife [name] on the phone on 4/24. Left VM on 4/25 and updated. Unable to reach son [name]--no VM set up Consults : G.I., nephrology CODE STATUS: full DVT Prophylaxis : SCD Level of care: Med-Surg Status is: Observation  The patient remains OBS appropriate and will d/c before 2 midnights.  Dispo: The patient is from: SNF  Anticipated d/c is to: SNF  Patient currently is medically best optimized  to d/c.              Difficult to place patient No   CONSULTS OBTAINED:  Treatment Team:  Liana Gerold, MD Jonathon Bellows, MD  DRUG ALLERGIES:   Allergies  Allergen Reactions  . Cefepime Other (See Comments)    Altered mental status  . Levofloxacin Other (See Comments)    LOSS OF CONSCIOUSNESS  . Nsaids Other (See Comments)    Patient instructed not to take NSAID's after his lung transplant  . Tolmetin     DISCHARGE MEDICATIONS:   Allergies as of 09/28/2020      Reactions   Cefepime Other (See Comments)   Altered mental status   Levofloxacin Other (See Comments)   LOSS OF CONSCIOUSNESS   Nsaids Other (See Comments)   Patient instructed not to take NSAID's after his lung transplant   Tolmetin       Medication List    STOP taking these medications   Alpha Lipoic Acid 200 MG Caps   amoxicillin-clavulanate 875-125 MG tablet Commonly known as: AUGMENTIN   azaTHIOprine 50 MG tablet Commonly known as: IMURAN   calcium acetate 667 MG capsule Commonly known as: PHOSLO   fluorometholone 0.1 % ophthalmic suspension Commonly known as: FML   gabapentin 100 MG capsule Commonly known as: NEURONTIN   heparin 5000 UNIT/ML injection   Immune Globulin 10% 20 GM/200ML Soln Commonly known as: PRIVIGEN   polyvinyl alcohol-povidone 1.4-0.6 % ophthalmic solution Commonly known as: HYPOTEARS   tizanidine 2 MG capsule Commonly known as: Zanaflex   tuberculin 5 UNIT/0.1ML injection   vancomycin 125 MG capsule Commonly known  as: VANCOCIN   Vancomycin HCl 50 MG/ML Solr     TAKE these medications   Accu-Chek FastClix Lancets Misc "As directed up to 4 times daily"   acetaminophen 160 MG/5ML suspension Commonly known as: TYLENOL Place 649.6 mg into feeding tube every 6 (six) hours as needed.   albuterol 108 (90 Base) MCG/ACT inhaler Commonly known as: VENTOLIN HFA Inhale 1-2 puffs into the lungs daily as needed for wheezing or shortness of breath.   aspirin 81 MG tablet Take 81 mg by mouth daily with breakfast.   Cosopt PF 22.3-6.8 MG/ML Soln ophthalmic solution Generic drug: dorzolamidel-timolol Place 1 drop into both eyes 2 (two) times daily.   cycloSPORINE 100 MG/ML microemulsion solution Commonly known as: GENGRAF Take 100 mg by mouth every 12 (twelve) hours.   fluticasone 50 MCG/ACT nasal spray Commonly known as: FLONASE Place 2 sprays into the nose daily. What changed:   when to take this  reasons to take this   insulin regular 100 units/mL injection Commonly known as: NOVOLIN R Inject into the skin See admin instructions. Inject per sliding scale: if 0-199=0; 200-250=1; 251-300=2; 301-350=3; 351-400=4 and call MD, subcutaneously four times a day. Give in addition to schedule amount. What changed: Another medication with the same name was removed. Continue taking this medication, and follow the directions you  see here.   lidocaine 5 % Commonly known as: LIDODERM Place 1 patch onto the skin daily. Place 1 patch onto the skin daily for 30 days Apply patch to the most painful area for up to 12 hours in a 24 hour period.   Loperamide HCl 1 MG/7.5ML Liqd Take 15 mLs (2 mg total) by G tube 3 (three) times daily as needed for Diarrhea   midodrine 10 MG tablet Commonly known as: PROAMATINE Place 10 mg into feeding tube daily as needed (for hypotension prior to PT/OT PRN for BP). What changed: Another medication with the same name was removed. Continue taking this medication, and follow the  directions you see here.   midodrine 10 MG tablet Commonly known as: PROAMATINE Place 10 mg into feeding tube 2 (two) times daily. Give before dialysis What changed: Another medication with the same name was removed. Continue taking this medication, and follow the directions you see here.   multivitamin with minerals Tabs tablet Place 1 tablet into feeding tube daily. Centrum for Men 50 plus: on Monday, Wednesday, Friday (dialysis days): take one tablet daily with breakfast; on Sunday, Tuesday, Thursday, Saturday: take one tablet daily with supper   omeprazole 2 mg/mL Susp oral suspension Commonly known as: FIRST-Omeprazole Place 40 mg into feeding tube daily. What changed: Another medication with the same name was removed. Continue taking this medication, and follow the directions you see here.   ondansetron 4 MG tablet Commonly known as: ZOFRAN Place 4 mg into feeding tube every 6 (six) hours as needed for nausea or vomiting.   pravastatin 10 MG tablet Commonly known as: PRAVACHOL Place 10 mg into feeding tube at bedtime.   predniSONE 5 MG tablet Commonly known as: DELTASONE Take 1 tablet (5 mg total) by G tube once daily . Crush and administer via GTube daily.   sulfamethoxazole-trimethoprim 400-80 MG tablet Commonly known as: BACTRIM Place 1 tablet into feeding tube See admin instructions. On Monday, Wednesday, Friday (dialysis days): take one tablet by mouth daily with supper   valganciclovir 50 MG/ML Solr Commonly known as: VALCYTE Take 450 mg by mouth See admin instructions. Take one tablet (450 mg) by mouth on Monday, Wednesday, and Friday after dialysis   vitamin B-6 25 MG tablet Commonly known as: pyridOXINE Place 25 mg into feeding tube daily with breakfast.       If you experience worsening of your admission symptoms, develop shortness of breath, life threatening emergency, suicidal or homicidal thoughts you must seek medical attention immediately by calling 911  or calling your MD immediately  if symptoms less severe.  You Must read complete instructions/literature along with all the possible adverse reactions/side effects for all the Medicines you take and that have been prescribed to you. Take any new Medicines after you have completely understood and accept all the possible adverse reactions/side effects.   Please note  You were cared for by a hospitalist during your hospital stay. If you have any questions about your discharge medications or the care you received while you were in the hospital after you are discharged, you can call the unit and asked to speak with the hospitalist on call if the hospitalist that took care of you is not available. Once you are discharged, your primary care physician will handle any further medical issues. Please note that NO REFILLS for any discharge medications will be authorized once you are discharged, as it is imperative that you return to your primary care physician (or establish a  relationship with a primary care physician if you do not have one) for your aftercare needs so that they can reassess your need for medications and monitor your lab values. Today   SUBJECTIVE     VITAL SIGNS:  Blood pressure 118/70, pulse 83, temperature 98.2 F (36.8 C), temperature source Oral, resp. rate 18, height 5\' 3"  (1.6 m), weight 61.5 kg, SpO2 100 %.  I/O:    Intake/Output Summary (Last 24 hours) at 09/28/2020 1109 Last data filed at 09/28/2020 0956 Gross per 24 hour  Intake 240 ml  Output --  Net 240 ml    PHYSICAL EXAMINATION:  GENERAL:  70 y.o.-year-old patient lying in the bed with no acute distress.  EYES: Pupils equal, round, reactive to light and accommodation. No scleral icterus.  HEENT: Head atraumatic, normocephalic. Oropharynx and nasopharynx clear.  NECK:  Supple, no jugular venous distention. No thyroid enlargement, no tenderness.  LUNGS: Normal breath sounds bilaterally, no wheezing, rales,rhonchi or  crepitation. No use of accessory muscles of respiration.  CARDIOVASCULAR: S1, S2 normal. No murmurs, rubs, or gallops.  ABDOMEN: Soft, non-tender, non-distended. Bowel sounds present. No organomegaly or mass.  EXTREMITIES: No pedal edema, cyanosis, or clubbing.  NEUROLOGIC: Cranial nerves II through XII are intact. Muscle strength 5/5 in all extremities. Sensation intact. Gait not checked.  PSYCHIATRIC: The patient is alert and oriented x 3.  SKIN: No obvious rash, lesion, or ulcer.   DATA REVIEW:   CBC  Recent Labs  Lab 09/27/20 0502  WBC 6.0  HGB 7.9*  HCT 23.6*  PLT 170    Chemistries  Recent Labs  Lab 09/26/20 1852 09/27/20 0502  NA 138 140  K 3.2* 3.3*  CL 95* 97*  CO2 30 28  GLUCOSE 252* 119*  BUN 27* 34*  CREATININE 3.68* 4.15*  CALCIUM 8.6* 8.6*  AST 70*  --   ALT 43  --   ALKPHOS 669*  --   BILITOT 1.8*  --     Microbiology Results   Recent Results (from the past 240 hour(s))  Resp Panel by RT-PCR (Flu A&B, Covid) Nasopharyngeal Swab     Status: None   Collection Time: 09/26/20  9:39 PM   Specimen: Nasopharyngeal Swab; Nasopharyngeal(NP) swabs in vial transport medium  Result Value Ref Range Status   SARS Coronavirus 2 by RT PCR NEGATIVE NEGATIVE Final    Comment: (NOTE) SARS-CoV-2 target nucleic acids are NOT DETECTED.  The SARS-CoV-2 RNA is generally detectable in upper respiratory specimens during the acute phase of infection. The lowest concentration of SARS-CoV-2 viral copies this assay can detect is 138 copies/mL. A negative result does not preclude SARS-Cov-2 infection and should not be used as the sole basis for treatment or other patient management decisions. A negative result may occur with  improper specimen collection/handling, submission of specimen other than nasopharyngeal swab, presence of viral mutation(s) within the areas targeted by this assay, and inadequate number of viral copies(<138 copies/mL). A negative result must be  combined with clinical observations, patient history, and epidemiological information. The expected result is Negative.  Fact Sheet for Patients:  EntrepreneurPulse.com.au  Fact Sheet for Healthcare Providers:  IncredibleEmployment.be  This test is no t yet approved or cleared by the [place] FDA and  has been authorized for detection and/or diagnosis of SARS-CoV-2 by FDA under an Emergency Use Authorization (EUA). This EUA will remain  in effect (meaning this test can be used) for the duration of the COVID-19 declaration under Section 564(b)(1) of the  Act, 21 U.S.C.section 360bbb-3(b)(1), unless the authorization is terminated  or revoked sooner.       Influenza A by PCR NEGATIVE NEGATIVE Final   Influenza B by PCR NEGATIVE NEGATIVE Final    Comment: (NOTE) The Xpert Xpress SARS-CoV-2/FLU/RSV plus assay is intended as an aid in the diagnosis of influenza from Nasopharyngeal swab specimens and should not be used as a sole basis for treatment. Nasal washings and aspirates are unacceptable for Xpert Xpress SARS-CoV-2/FLU/RSV testing.  Fact Sheet for Patients: EntrepreneurPulse.com.au  Fact Sheet for Healthcare Providers: IncredibleEmployment.be  This test is not yet approved or cleared by the [place] FDA and has been authorized for detection and/or diagnosis of SARS-CoV-2 by FDA under an Emergency Use Authorization (EUA). This EUA will remain in effect (meaning this test can be used) for the duration of the COVID-19 declaration under Section 564(b)(1) of the Act, 21 U.S.C. section 360bbb-3(b)(1), unless the authorization is terminated or revoked.  Performed at [facility], [address] [place], [place] [address]   MRSA PCR Screening     Status: None   Collection Time: 09/27/20  4:30 AM   Specimen: Nasopharyngeal  Result Value Ref Range Status   MRSA by PCR NEGATIVE NEGATIVE  Final    Comment:        The GeneXpert MRSA Assay (FDA approved for NASAL specimens only), is one component of a comprehensive MRSA colonization surveillance program. It is not intended to diagnose MRSA infection nor to guide or monitor treatment for MRSA infections. Performed at [facility], [address] [place], [place] [address]     RADIOLOGY:  [facility] Chest Port 1 View  Result Date: 09/27/2020 CLINICAL DATA:  Anemia EXAM: PORTABLE CHEST 1 VIEW COMPARISON:  09/18/2020 FINDINGS: There is fibrotic change in the left lung with unchanged left apical opacity. Right IJ approach central venous catheter tip is in unchanged position. Small right pleural effusion. IMPRESSION: 1. Unchanged appearance of the chest with fibrotic changes in the left lung. Electronically Signed   By: Ulyses Jarred M.D.   On: 09/27/2020 02:26     CODE STATUS:     Code Status Orders  (From admission, onward)         Start     Ordered   09/26/20 2014  Full code  Continuous        09/26/20 2015        Code Status History    Date Active Date Inactive Code Status Order ID Comments User Context   07/17/2020 2114 07/21/2020 2125 Full Code [id number]  Rise Patience, MD ED   04/24/2020 1911 04/27/2020 1919 Full Code [id number]  Marcelyn Bruins, MD ED   01/08/2020 0722 01/14/2020 1741 Full Code [id number]  Vernelle Emerald, MD ED   01/13/2019 2127 01/15/2019 1612 Full Code [id number]  Oswald Hillock, MD Inpatient   12/30/2015 0843 01/02/2016 0143 Full Code [id number]  Waldemar Dickens, MD ED   04/18/2015 1308 04/19/2015 1826 Full Code [id number]  Rush Farmer, MD ED   08/15/2013 1508 08/19/2013 1540 Full Code [id number]  Melvenia Needles, NP Inpatient   03/03/2013 2329 03/05/2013 1417 Full Code [id number]  Barton Dubois, MD Inpatient   11/11/2012 2312 11/13/2012 1512 Full Code [id number]  Siqueiros Kenton Kingfisher, MD Inpatient   Advance Care Planning Activity    Advance Directive Documentation   Flowsheet Row  Most Recent Value  Type of Advance Directive Healthcare Power of Attorney  Pre-existing out of facility DNR  order (yellow form or pink MOST form) --  "MOST" Form in Place? --       TOTAL TIME TAKING CARE OF THIS PATIENT: *40* minutes.    Fritzi Mandes M.D  [facility]    CC: Primary care physician; Wenda Low, MD

## 2020-09-30 DIAGNOSIS — D631 Anemia in chronic kidney disease: Secondary | ICD-10-CM | POA: Diagnosis not present

## 2020-09-30 DIAGNOSIS — N2581 Secondary hyperparathyroidism of renal origin: Secondary | ICD-10-CM | POA: Diagnosis not present

## 2020-09-30 DIAGNOSIS — Z992 Dependence on renal dialysis: Secondary | ICD-10-CM | POA: Diagnosis not present

## 2020-09-30 DIAGNOSIS — N186 End stage renal disease: Secondary | ICD-10-CM | POA: Diagnosis not present

## 2020-10-01 DIAGNOSIS — E119 Type 2 diabetes mellitus without complications: Secondary | ICD-10-CM | POA: Diagnosis not present

## 2020-10-01 DIAGNOSIS — D649 Anemia, unspecified: Secondary | ICD-10-CM | POA: Diagnosis not present

## 2020-10-02 DIAGNOSIS — Z992 Dependence on renal dialysis: Secondary | ICD-10-CM | POA: Diagnosis not present

## 2020-10-02 DIAGNOSIS — D631 Anemia in chronic kidney disease: Secondary | ICD-10-CM | POA: Diagnosis not present

## 2020-10-02 DIAGNOSIS — N186 End stage renal disease: Secondary | ICD-10-CM | POA: Diagnosis not present

## 2020-10-02 DIAGNOSIS — N2581 Secondary hyperparathyroidism of renal origin: Secondary | ICD-10-CM | POA: Diagnosis not present

## 2020-10-04 DIAGNOSIS — N186 End stage renal disease: Secondary | ICD-10-CM | POA: Diagnosis not present

## 2020-10-04 DIAGNOSIS — Z992 Dependence on renal dialysis: Secondary | ICD-10-CM | POA: Diagnosis not present

## 2020-10-04 DIAGNOSIS — T86819 Unspecified complication of lung transplant: Secondary | ICD-10-CM | POA: Diagnosis not present

## 2020-10-05 DIAGNOSIS — Z23 Encounter for immunization: Secondary | ICD-10-CM | POA: Diagnosis not present

## 2020-10-05 DIAGNOSIS — N2581 Secondary hyperparathyroidism of renal origin: Secondary | ICD-10-CM | POA: Diagnosis not present

## 2020-10-05 DIAGNOSIS — Z992 Dependence on renal dialysis: Secondary | ICD-10-CM | POA: Diagnosis not present

## 2020-10-05 DIAGNOSIS — N186 End stage renal disease: Secondary | ICD-10-CM | POA: Diagnosis not present

## 2020-10-06 IMAGING — DX DG CHEST 1V PORT
1 series · 1 of 1 positions shown · non-contrast
Comparison: Prior chest x-ray 06/24/2016

CLINICAL DATA: 67-year-old male with shortness of breath and
lethargy during dialysis today. History of a prior right lung
transplantation.

EXAM:
PORTABLE CHEST 1 VIEW

[chest ap]
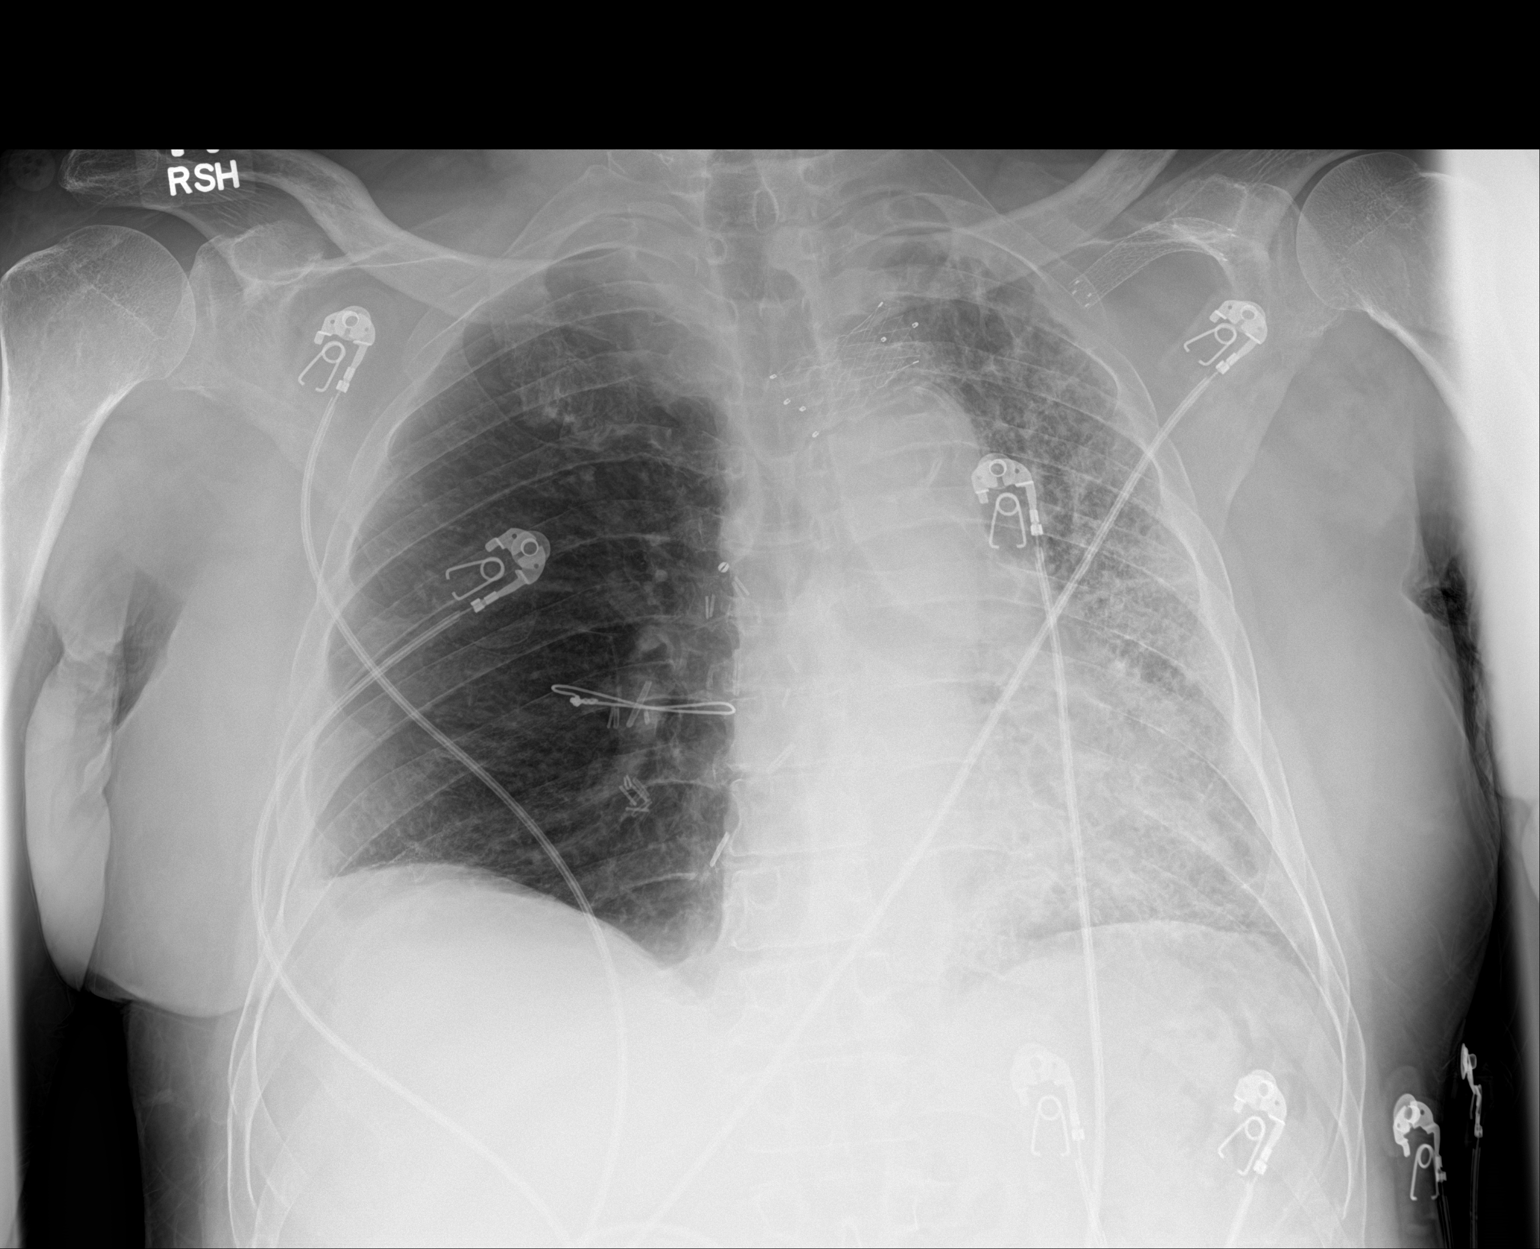

[1 of 1 positions shown; findings below may reference images not displayed]

FINDINGS: Stable cardiac and mediastinal contours with right to left shift of
the structures due to volume loss and advanced fibrosis in the left
lung. The transplanted right lung remains well aerated. No evidence
of pneumothorax, pleural effusion or focal airspace consolidation.
Interval placement of a metallic stent in the region the cephalic
arch compared to the prior chest x-ray. Stable stent in the left
innominate vein. Calcifications again noted in the transverse aorta.
No acute osseous abnormality.
IMPRESSION: Stable chest x-ray without evidence of acute cardiopulmonary
process.

Evidence of advanced fibrosis in the native left lung and surgical
changes of prior right lung transplant.

Aortic Atherosclerosis ([id number]-170.0).

## 2020-10-07 DIAGNOSIS — Z23 Encounter for immunization: Secondary | ICD-10-CM | POA: Diagnosis not present

## 2020-10-07 DIAGNOSIS — N186 End stage renal disease: Secondary | ICD-10-CM | POA: Diagnosis not present

## 2020-10-07 DIAGNOSIS — Z992 Dependence on renal dialysis: Secondary | ICD-10-CM | POA: Diagnosis not present

## 2020-10-07 DIAGNOSIS — N2581 Secondary hyperparathyroidism of renal origin: Secondary | ICD-10-CM | POA: Diagnosis not present

## 2020-10-08 DIAGNOSIS — M25512 Pain in left shoulder: Secondary | ICD-10-CM | POA: Diagnosis not present

## 2020-10-08 DIAGNOSIS — Z9989 Dependence on other enabling machines and devices: Secondary | ICD-10-CM | POA: Diagnosis not present

## 2020-10-09 DIAGNOSIS — Z992 Dependence on renal dialysis: Secondary | ICD-10-CM | POA: Diagnosis not present

## 2020-10-09 DIAGNOSIS — N186 End stage renal disease: Secondary | ICD-10-CM | POA: Diagnosis not present

## 2020-10-09 DIAGNOSIS — N2581 Secondary hyperparathyroidism of renal origin: Secondary | ICD-10-CM | POA: Diagnosis not present

## 2020-10-09 DIAGNOSIS — Z23 Encounter for immunization: Secondary | ICD-10-CM | POA: Diagnosis not present

## 2020-10-11 ENCOUNTER — Emergency Department (HOSPITAL_COMMUNITY)
Admission: EM | Admit: 2020-10-11 | Discharge: 2020-10-11 | Disposition: A | Discharge status: Home / Self Care | Payer: Medicare Other | Source: Home / Self Care | Attending: Emergency Medicine | Admitting: Emergency Medicine

## 2020-10-11 ENCOUNTER — Encounter (HOSPITAL_COMMUNITY): Payer: Self-pay

## 2020-10-11 ENCOUNTER — Other Ambulatory Visit: Payer: Self-pay

## 2020-10-11 DIAGNOSIS — Z794 Long term (current) use of insulin: Secondary | ICD-10-CM | POA: Insufficient documentation

## 2020-10-11 DIAGNOSIS — T82598A Other mechanical complication of other cardiac and vascular devices and implants, initial encounter: Secondary | ICD-10-CM | POA: Insufficient documentation

## 2020-10-11 DIAGNOSIS — E872 Acidosis: Secondary | ICD-10-CM | POA: Diagnosis not present

## 2020-10-11 DIAGNOSIS — H409 Unspecified glaucoma: Secondary | ICD-10-CM | POA: Diagnosis not present

## 2020-10-11 DIAGNOSIS — Z992 Dependence on renal dialysis: Secondary | ICD-10-CM | POA: Insufficient documentation

## 2020-10-11 DIAGNOSIS — D72829 Elevated white blood cell count, unspecified: Secondary | ICD-10-CM | POA: Diagnosis not present

## 2020-10-11 DIAGNOSIS — Z79899 Other long term (current) drug therapy: Secondary | ICD-10-CM | POA: Insufficient documentation

## 2020-10-11 DIAGNOSIS — I959 Hypotension, unspecified: Secondary | ICD-10-CM | POA: Diagnosis not present

## 2020-10-11 DIAGNOSIS — Z87891 Personal history of nicotine dependence: Secondary | ICD-10-CM | POA: Insufficient documentation

## 2020-10-11 DIAGNOSIS — E1122 Type 2 diabetes mellitus with diabetic chronic kidney disease: Secondary | ICD-10-CM | POA: Insufficient documentation

## 2020-10-11 DIAGNOSIS — R000 Tachycardia, unspecified: Secondary | ICD-10-CM | POA: Diagnosis not present

## 2020-10-11 DIAGNOSIS — G4733 Obstructive sleep apnea (adult) (pediatric): Secondary | ICD-10-CM | POA: Diagnosis not present

## 2020-10-11 DIAGNOSIS — I120 Hypertensive chronic kidney disease with stage 5 chronic kidney disease or end stage renal disease: Secondary | ICD-10-CM | POA: Insufficient documentation

## 2020-10-11 DIAGNOSIS — F32A Depression, unspecified: Secondary | ICD-10-CM | POA: Diagnosis not present

## 2020-10-11 DIAGNOSIS — Z942 Lung transplant status: Secondary | ICD-10-CM | POA: Diagnosis not present

## 2020-10-11 DIAGNOSIS — Z20822 Contact with and (suspected) exposure to covid-19: Secondary | ICD-10-CM | POA: Diagnosis not present

## 2020-10-11 DIAGNOSIS — I6529 Occlusion and stenosis of unspecified carotid artery: Secondary | ICD-10-CM | POA: Diagnosis not present

## 2020-10-11 DIAGNOSIS — R6521 Severe sepsis with septic shock: Secondary | ICD-10-CM | POA: Diagnosis not present

## 2020-10-11 DIAGNOSIS — M792 Neuralgia and neuritis, unspecified: Secondary | ICD-10-CM

## 2020-10-11 DIAGNOSIS — D84821 Immunodeficiency due to drugs: Secondary | ICD-10-CM | POA: Diagnosis not present

## 2020-10-11 DIAGNOSIS — A419 Sepsis, unspecified organism: Secondary | ICD-10-CM | POA: Diagnosis not present

## 2020-10-11 DIAGNOSIS — E43 Unspecified severe protein-calorie malnutrition: Secondary | ICD-10-CM | POA: Diagnosis not present

## 2020-10-11 DIAGNOSIS — M79602 Pain in left arm: Secondary | ICD-10-CM | POA: Diagnosis not present

## 2020-10-11 DIAGNOSIS — I10 Essential (primary) hypertension: Secondary | ICD-10-CM | POA: Diagnosis not present

## 2020-10-11 DIAGNOSIS — J3489 Other specified disorders of nose and nasal sinuses: Secondary | ICD-10-CM | POA: Diagnosis not present

## 2020-10-11 DIAGNOSIS — R571 Hypovolemic shock: Secondary | ICD-10-CM | POA: Diagnosis not present

## 2020-10-11 DIAGNOSIS — Z7982 Long term (current) use of aspirin: Secondary | ICD-10-CM | POA: Insufficient documentation

## 2020-10-11 DIAGNOSIS — Z9981 Dependence on supplemental oxygen: Secondary | ICD-10-CM | POA: Diagnosis not present

## 2020-10-11 DIAGNOSIS — D631 Anemia in chronic kidney disease: Secondary | ICD-10-CM | POA: Diagnosis not present

## 2020-10-11 DIAGNOSIS — N186 End stage renal disease: Secondary | ICD-10-CM | POA: Diagnosis not present

## 2020-10-11 DIAGNOSIS — A403 Sepsis due to Streptococcus pneumoniae: Secondary | ICD-10-CM | POA: Diagnosis not present

## 2020-10-11 DIAGNOSIS — J9610 Chronic respiratory failure, unspecified whether with hypoxia or hypercapnia: Secondary | ICD-10-CM | POA: Diagnosis not present

## 2020-10-11 DIAGNOSIS — E46 Unspecified protein-calorie malnutrition: Secondary | ICD-10-CM | POA: Diagnosis not present

## 2020-10-11 DIAGNOSIS — R531 Weakness: Secondary | ICD-10-CM | POA: Diagnosis not present

## 2020-10-11 MED ORDER — HYDROMORPHONE HCL 1 MG/ML IJ SOLN
1.0000 mg | Freq: Once | INTRAMUSCULAR | Status: AC
  Administered 2020-10-11: 1 mg via INTRAMUSCULAR
  Filled 2020-10-11: qty 1

## 2020-10-11 MED ORDER — OXYCODONE HCL 5 MG PO TABS: 5.0000 mg | ORAL_TABLET | ORAL | 0 refills | Status: DC | PRN

## 2020-10-11 NOTE — ED Triage Notes (Signed)
Patient reports his AVF in his L arm is hurting him, reports last HD on Friday.

## 2020-10-11 NOTE — ED Provider Notes (Signed)
[facility] EMERGENCY DEPARTMENT Provider Note   CSN: [id number] Arrival date & time: 10/11/20  4403     History Chief Complaint  Patient presents with  . Vascular Access Problem    [name] is a 70 y.o. male.  70 year old male with history of end-stage liver disease (attends dialysis Monday, Wednesday, Friday, last had full dialysis on Friday), lung transplant, pulmonary fibrosis, diabetes with additional history as listed below who presents with complaint of pain in his left arm AV fistula.  Patient states this has been ongoing for several weeks, has been worse since dialysis on Friday.  Patient is scheduled for new vascular access procedure on Tuesday (10/13/20). States he was stung by a bee at his fistula site on Thursday and treated with prednisone. Denies numbness or tingling in his fingers, injuries, fevers. No other complaints or concerns.         Past Medical History:  Diagnosis Date  . Aortic valve disorders   . Benign neoplasm of colon   . Degeneration of intervertebral disc, site unspecified   . Diabetes mellitus without complication ([place])   . 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 Esophageal reflux   . ESRD (end stage renal disease) on dialysis ([place])   . Essential hypertension   . Obstructive sleep apnea (adult) (pediatric)   . Osteoarthrosis, unspecified whether generalized or localized, unspecified site   . Other and unspecified hyperlipidemia   . Pneumonia    interstitial pneumonia  . Pulmonary fibrosis ([place])   . Renal disorder   . Respiratory failure with hypoxia ([place]) 12/2015  . Transplanted, lung ([place])   . Unspecified essential hypertension     Patient Active Problem List   Diagnosis Date Noted  . Anemia 09/26/2020  . Exercise hypoxemia   . Pneumonia 07/17/2020  . Lung mass 07/17/2020  . Acute bronchiolitis due to human metapneumovirus 04/25/2020  . Sepsis with acute organ dysfunction ([place])  01/08/2020  . Lactic acidosis 01/08/2020  . Mixed diabetic hyperlipidemia associated with type 2 diabetes mellitus ([place]) 01/08/2020  . Hepatic cirrhosis ([place]) 01/08/2020  . Uncontrolled type 2 diabetes mellitus with hyperglycemia, with long-term current use of insulin ([place]) 01/08/2020  . IV infiltrate, initial encounter 01/08/2020  . Acute diarrhea 01/08/2020  . Acute metabolic encephalopathy 47/42/5956  . 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 AV fistula infection, initial encounter ([place]) 01/14/2019  . Immunocompromised state ([place]) 01/14/2019  . Wound infection 01/13/2019  . Sepsis ([place])   . CKD (chronic kidney disease)   . Generalized weakness 12/30/2015  . ESRD (end stage renal disease) ([place])   . 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[place])   . Acute respiratory failure with hypoxemia ([place]) 04/18/2015  . Septic shock ([place]) 04/18/2015  . Acute encephalopathy 04/18/2015  . Acute respiratory failure with hypoxia ([place]) 04/18/2015  . Cardiac arrest ([place])   . HCAP (healthcare-associated pneumonia)   . Elevated rheumatoid factor 05/09/2014  . Essential hypertension 05/09/2014  . ILD (interstitial lung disease) ([place]) 05/09/2014  . Obstructive apnea 05/09/2014  . Lung nodule, solitary 04/18/2014  . Awaiting organ transplant 04/18/2014  . Acute on chronic respiratory failure with hypoxia ([place]) 08/15/2013  . Edema 07/05/2013  . Chronic respiratory failure ([place]) 03/03/2013  . Diabetes mellitus with complication ([place]) 38/75/6433  . Acute sinusitis 05/04/2011  . Cough 11/10/2010  . 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 Obstructive sleep apnea 10/09/2008  . GERD without esophagitis 10/09/2008  . HIATAL HERNIA 10/09/2008  .  OSTEOARTHRITIS 10/09/2008    Past Surgical History:  Procedure Laterality Date  . COLONOSCOPY WITH PROPOFOL N/A 11/07/2017   Procedure: COLONOSCOPY WITH PROPOFOL;  Surgeon: Ronnette Juniper, MD;  Location: [facility];  Service: Gastroenterology;  Laterality:  N/A;  . LIGATION OF ARTERIOVENOUS  FISTULA Left 01/14/2019   Procedure: LIGATION OF ARTERIOVENOUS  FISTULA;  Surgeon: Marty Heck, MD;  Location: [facility];  Service: Vascular;  Laterality: Left;  . LUNG BIOPSY  2010  . LUNG TRANSPLANT, SINGLE Right   . POLYPECTOMY  11/07/2017   Procedure: POLYPECTOMY;  Surgeon: Ronnette Juniper, MD;  Location: [facility];  Service: Gastroenterology;;       Family History  Problem Relation Age of Onset  . Pancreatic cancer Brother   . Heart disease Father     Social History   Tobacco Use  . Smoking status: Former Smoker    Packs/day: 0.30    Years: 20.00    Pack years: 6.00    Types: Cigarettes    Quit date: 06/06/2002    Years since quitting: 18.3  . Smokeless tobacco: Never Used  Vaping Use  . Vaping Use: Never used  Substance Use Topics  . Alcohol use: No    Alcohol/week: 0.0 standard drinks  . Drug use: No    Home Medications Prior to Admission medications   Medication Sig Start Date End Date Taking? Authorizing Provider  oxyCODONE (ROXICODONE) 5 MG immediate release tablet Take 1 tablet (5 mg total) by mouth every 4 (four) hours as needed for severe pain. 10/11/20  Yes Tacy Learn, PA-C  ACCU-CHEK FASTCLIX LANCETS MISC "As directed up to 4 times daily" 12/16/15   [provider]  acetaminophen (TYLENOL) 160 MG/5ML suspension Place 649.6 mg into feeding tube every 6 (six) hours as needed. 09/15/20   [provider]  albuterol (VENTOLIN HFA) 108 (90 Base) MCG/ACT inhaler Inhale 1-2 puffs into the lungs daily as needed for wheezing or shortness of breath. 05/12/20   [provider]  aspirin 81 MG tablet Take 81 mg by mouth daily with breakfast.    [provider]  cycloSPORINE (GENGRAF) 100 MG/ML microemulsion solution Take 100 mg by mouth every 12 (twelve) hours.    [provider]  dorzolamidel-timolol (COSOPT PF) 22.3-6.8 MG/ML SOLN ophthalmic solution Place 1 drop into both eyes 2 (two) times  daily.    [provider]  fluticasone (FLONASE) 50 MCG/ACT nasal spray Place 2 sprays into the nose daily. Patient taking differently: Place 2 sprays into the nose daily as needed for allergies. 03/05/13   Wenda Low, MD  insulin regular (NOVOLIN R) 100 units/mL injection Inject into the skin See admin instructions. Inject per sliding scale: if 0-199=0; 200-250=1; 251-300=2; 301-350=3; 351-400=4 and call MD, subcutaneously four times a day. Give in addition to schedule amount.    [provider]  lidocaine (LIDODERM) 5 % Place 1 patch onto the skin daily. Place 1 patch onto the skin daily for 30 days Apply patch to the most painful area for up to 12 hours in a 24 hour period. 09/16/20 10/16/20  [provider]  Loperamide HCl 1 MG/7.5ML LIQD Take 15 mLs (2 mg total) by G tube 3 (three) times daily as needed for Diarrhea 09/15/20   [provider]  midodrine (PROAMATINE) 10 MG tablet Place 10 mg into feeding tube daily as needed (for hypotension prior to PT/OT PRN for BP).    [provider]  midodrine (PROAMATINE) 10 MG tablet Place 10  mg into feeding tube 2 (two) times daily. Give before dialysis    [provider]  Multiple Vitamin (MULTIVITAMIN WITH MINERALS) TABS tablet Place 1 tablet into feeding tube daily. Centrum for Men 50 plus: on Monday, Wednesday, Friday (dialysis days): take one tablet daily with breakfast; on Sunday, Tuesday, Thursday, Saturday: take one tablet daily with supper    [provider]  Nutritional Supplements (FEEDING SUPPLEMENT, NEPRO CARB STEADY,) LIQD Place 1,000 mLs into feeding tube daily. 09/28/20   Fritzi Mandes, MD  omeprazole (FIRST-OMEPRAZOLE) 2 mg/mL SUSP oral suspension Place 40 mg into feeding tube daily.    [provider]  ondansetron (ZOFRAN) 4 MG tablet Place 4 mg into feeding tube every 6 (six) hours as needed for nausea or vomiting.    [provider]  pravastatin (PRAVACHOL) 10 MG  tablet Place 10 mg into feeding tube at bedtime. 03/16/20   [provider]  predniSONE (DELTASONE) 5 MG tablet Take 1 tablet (5 mg total) by G tube once daily . Crush and administer via GTube daily. 09/16/20 09/16/21  [provider]  sulfamethoxazole-trimethoprim (BACTRIM,SEPTRA) 400-80 MG tablet Place 1 tablet into feeding tube See admin instructions. On Monday, Wednesday, Friday (dialysis days): take one tablet by mouth daily with supper 02/27/17   [provider]  valganciclovir (VALCYTE) 50 MG/ML SOLR Take 450 mg by mouth See admin instructions. Take one tablet (450 mg) by mouth on Monday, Wednesday, and Friday after dialysis 03/26/15   [provider]  vitamin B-6 (PYRIDOXINE) 25 MG tablet Place 25 mg into feeding tube daily with breakfast.    [provider]    Allergies    Cefepime, Levofloxacin, Nsaids, and Tolmetin  Review of Systems   Review of Systems  Constitutional: Negative for fever.  Musculoskeletal: Negative for arthralgias and myalgias.  Skin: Negative for rash and wound.  Allergic/Immunologic: Positive for immunocompromised state.  Neurological: Negative for weakness and numbness.  Hematological: Negative for adenopathy.  Psychiatric/Behavioral: Negative for confusion.    Physical Exam Updated Vital Signs BP (!) 158/80   Pulse 78   Temp 97.8 F (36.6 C) (Oral)   Resp 19   Ht 5' 3.5" (1.613 m)   Wt 60 kg   SpO2 100%   BMI 23.06 kg/m   Physical Exam Vitals and nursing note reviewed.  Constitutional:      General: He is not in acute distress.    Appearance: He is well-developed. He is not diaphoretic.  HENT:     Head: Normocephalic and atraumatic.  Cardiovascular:     Pulses: Normal pulses.  Pulmonary:     Effort: Pulmonary effort is normal.  Musculoskeletal:        General: No swelling, tenderness or deformity.       Arms:  Skin:    General: Skin is warm and dry.     Findings: No erythema or rash.   Neurological:     Mental Status: He is alert and oriented to person, place, and time.  Psychiatric:        Behavior: Behavior normal.     ED Results / Procedures / Treatments   Labs (all labs ordered are listed, but only abnormal results are displayed) Labs Reviewed - No data to display  EKG None  Radiology No results found.  Procedures Procedures   Medications Ordered in ED Medications  HYDROmorphone (DILAUDID) injection 1 mg (1 mg Intramuscular Given 10/11/20 0835)    ED Course  I have reviewed the triage vital  signs and the nursing notes.  Pertinent labs & imaging results that were available during my care of the patient were reviewed by me and considered in my medical decision making (see chart for details).  Clinical Course as of 10/11/20 9038  Sun Oct 12, 8467  6031 70 year old male with complaint of pain at the proximal aspect of his fistula, ongoing for several weeks, worse today. States his hand feels a little stiff. Fistula was used 2 days ago and functioned well at that time. No tenderness to the fistula, to erythema.  Discussed with Dr. Johnney Killian, ER attending who has seen the patient, reports patient now with complaint of more radicular pain pattern possibly from radiation to the area vs lymphedema.  Plan is for IM dilaudid today with rx for oxycodone sent to his pharmacy with plan to follow up with his pcp to consider EMG or other workup if necessary.  [LM]    Clinical Course User Index [LM] Roque Lias   MDM Rules/Calculators/A&P                          Final Clinical Impression(s) / ED Diagnoses Final diagnoses:  Radicular pain in left arm    Rx / DC Orders ED Discharge Orders         Ordered    oxyCODONE (ROXICODONE) 5 MG immediate release tablet  Every 4 hours PRN        10/11/20 0829           Tacy Learn, PA-C 10/11/20 3338    Charlesetta Shanks, MD 10/11/20 [phone]

## 2020-10-11 NOTE — Discharge Instructions (Addendum)
Take Oxycodone as needed as prescribed for pain. Follow up with your doctor. You may need a nerve conduction study to evaluate the nerves in your arm.

## 2020-10-11 NOTE — ED Provider Notes (Signed)
I provided a substantive portion of the care of this patient.  I personally performed the entirety of the exam for this encounter.   Patient ports he is having severe pain behind his arm on the left.  He indicates the posterior aspect of the axilla and then pain on the volar side of the arm down to behind the elbow the forearm into the hand.  Patient is alert without respiratory distress.  Physical exam does not show distinct lymphadenopathy or tenderness within the axilla.  Patient is uncomfortable to palpation to the posterior aspect of the axilla and down the posterior arm also with some hypersensitivity to light touch.  The vascular access graft is pliable and nontender.  Good thrill.  Patient hand is warm and dry.  Cap refill is brisk less than 2 seconds.  Patient appears to have very subtle edema of the dorsum of the hand on the left relative to the right.  However there is no warmth or erythema to this.  There is no pitting.  It is soft and pliable.  Based on history and exam at this time I have high suspicion for radicular pain potentially as a postradiation complication.  Patient has had radiation to the shoulder in the axilla in that area.  Pain is been present for 2 weeks.  He is neurovascular intact and no signs of problems with the fistula.  At this time I feel patient is appropriate for increased pain control regimen and close follow-up with his PCP and oncologist for further evaluation of radicular pain with some possible, very mild lymphedema versus a chronic slightly asymmetric swelling with history of fistula.  No signs of vascular compromise to the hand and arm.      Charlesetta Shanks, MD 10/11/20 0900

## 2020-10-12 ENCOUNTER — Emergency Department (HOSPITAL_COMMUNITY): Payer: Medicare Other

## 2020-10-12 ENCOUNTER — Encounter (HOSPITAL_COMMUNITY): Payer: Self-pay

## 2020-10-12 ENCOUNTER — Other Ambulatory Visit: Payer: Self-pay

## 2020-10-12 ENCOUNTER — Inpatient Hospital Stay (HOSPITAL_COMMUNITY)
Admission: EM | Admit: 2020-10-12 | Discharge: 2020-10-18 | DRG: 871 | Disposition: A | Discharge status: Home / Self Care | Payer: Medicare Other | Attending: Internal Medicine | Admitting: Internal Medicine

## 2020-10-12 DIAGNOSIS — R200 Anesthesia of skin: Secondary | ICD-10-CM | POA: Diagnosis present

## 2020-10-12 DIAGNOSIS — Z7952 Long term (current) use of systemic steroids: Secondary | ICD-10-CM

## 2020-10-12 DIAGNOSIS — N186 End stage renal disease: Secondary | ICD-10-CM | POA: Diagnosis not present

## 2020-10-12 DIAGNOSIS — M79603 Pain in arm, unspecified: Secondary | ICD-10-CM | POA: Diagnosis not present

## 2020-10-12 DIAGNOSIS — D72829 Elevated white blood cell count, unspecified: Secondary | ICD-10-CM | POA: Diagnosis not present

## 2020-10-12 DIAGNOSIS — R571 Hypovolemic shock: Principal | ICD-10-CM | POA: Diagnosis present

## 2020-10-12 DIAGNOSIS — R54 Age-related physical debility: Secondary | ICD-10-CM | POA: Diagnosis present

## 2020-10-12 DIAGNOSIS — Z87891 Personal history of nicotine dependence: Secondary | ICD-10-CM

## 2020-10-12 DIAGNOSIS — R531 Weakness: Secondary | ICD-10-CM | POA: Diagnosis not present

## 2020-10-12 DIAGNOSIS — R0902 Hypoxemia: Secondary | ICD-10-CM | POA: Diagnosis not present

## 2020-10-12 DIAGNOSIS — Z794 Long term (current) use of insulin: Secondary | ICD-10-CM

## 2020-10-12 DIAGNOSIS — Z86718 Personal history of other venous thrombosis and embolism: Secondary | ICD-10-CM

## 2020-10-12 DIAGNOSIS — I10 Essential (primary) hypertension: Secondary | ICD-10-CM | POA: Diagnosis not present

## 2020-10-12 DIAGNOSIS — Z931 Gastrostomy status: Secondary | ICD-10-CM

## 2020-10-12 DIAGNOSIS — Z79899 Other long term (current) drug therapy: Secondary | ICD-10-CM

## 2020-10-12 DIAGNOSIS — I953 Hypotension of hemodialysis: Secondary | ICD-10-CM | POA: Diagnosis present

## 2020-10-12 DIAGNOSIS — Z8249 Family history of ischemic heart disease and other diseases of the circulatory system: Secondary | ICD-10-CM

## 2020-10-12 DIAGNOSIS — K7460 Unspecified cirrhosis of liver: Secondary | ICD-10-CM | POA: Diagnosis present

## 2020-10-12 DIAGNOSIS — Z86010 Personal history of colonic polyps: Secondary | ICD-10-CM

## 2020-10-12 DIAGNOSIS — E872 Acidosis, unspecified: Secondary | ICD-10-CM

## 2020-10-12 DIAGNOSIS — E1122 Type 2 diabetes mellitus with diabetic chronic kidney disease: Secondary | ICD-10-CM | POA: Diagnosis present

## 2020-10-12 DIAGNOSIS — R579 Shock, unspecified: Secondary | ICD-10-CM

## 2020-10-12 DIAGNOSIS — Z942 Lung transplant status: Secondary | ICD-10-CM

## 2020-10-12 DIAGNOSIS — Z800 Family history of malignant neoplasm of digestive organs: Secondary | ICD-10-CM

## 2020-10-12 DIAGNOSIS — I9589 Other hypotension: Secondary | ICD-10-CM | POA: Diagnosis present

## 2020-10-12 DIAGNOSIS — J3489 Other specified disorders of nose and nasal sinuses: Secondary | ICD-10-CM | POA: Diagnosis not present

## 2020-10-12 DIAGNOSIS — K219 Gastro-esophageal reflux disease without esophagitis: Secondary | ICD-10-CM | POA: Diagnosis present

## 2020-10-12 DIAGNOSIS — N2581 Secondary hyperparathyroidism of renal origin: Secondary | ICD-10-CM | POA: Diagnosis not present

## 2020-10-12 DIAGNOSIS — Z886 Allergy status to analgesic agent status: Secondary | ICD-10-CM

## 2020-10-12 DIAGNOSIS — E43 Unspecified severe protein-calorie malnutrition: Secondary | ICD-10-CM | POA: Diagnosis present

## 2020-10-12 DIAGNOSIS — Z881 Allergy status to other antibiotic agents status: Secondary | ICD-10-CM

## 2020-10-12 DIAGNOSIS — Z6823 Body mass index (BMI) 23.0-23.9, adult: Secondary | ICD-10-CM

## 2020-10-12 DIAGNOSIS — R6521 Severe sepsis with septic shock: Secondary | ICD-10-CM | POA: Diagnosis not present

## 2020-10-12 DIAGNOSIS — R197 Diarrhea, unspecified: Secondary | ICD-10-CM | POA: Diagnosis not present

## 2020-10-12 DIAGNOSIS — Z888 Allergy status to other drugs, medicaments and biological substances status: Secondary | ICD-10-CM

## 2020-10-12 DIAGNOSIS — Z7982 Long term (current) use of aspirin: Secondary | ICD-10-CM

## 2020-10-12 DIAGNOSIS — D631 Anemia in chronic kidney disease: Secondary | ICD-10-CM | POA: Diagnosis not present

## 2020-10-12 DIAGNOSIS — Z85118 Personal history of other malignant neoplasm of bronchus and lung: Secondary | ICD-10-CM

## 2020-10-12 DIAGNOSIS — I6529 Occlusion and stenosis of unspecified carotid artery: Secondary | ICD-10-CM | POA: Diagnosis not present

## 2020-10-12 DIAGNOSIS — A419 Sepsis, unspecified organism: Secondary | ICD-10-CM | POA: Diagnosis present

## 2020-10-12 DIAGNOSIS — Z8719 Personal history of other diseases of the digestive system: Secondary | ICD-10-CM

## 2020-10-12 DIAGNOSIS — I959 Hypotension, unspecified: Secondary | ICD-10-CM | POA: Diagnosis not present

## 2020-10-12 DIAGNOSIS — E876 Hypokalemia: Secondary | ICD-10-CM | POA: Diagnosis not present

## 2020-10-12 DIAGNOSIS — E785 Hyperlipidemia, unspecified: Secondary | ICD-10-CM | POA: Diagnosis present

## 2020-10-12 DIAGNOSIS — D84821 Immunodeficiency due to drugs: Secondary | ICD-10-CM | POA: Diagnosis present

## 2020-10-12 DIAGNOSIS — E8889 Other specified metabolic disorders: Secondary | ICD-10-CM | POA: Diagnosis present

## 2020-10-12 DIAGNOSIS — R000 Tachycardia, unspecified: Secondary | ICD-10-CM | POA: Diagnosis not present

## 2020-10-12 DIAGNOSIS — I120 Hypertensive chronic kidney disease with stage 5 chronic kidney disease or end stage renal disease: Secondary | ICD-10-CM | POA: Diagnosis present

## 2020-10-12 DIAGNOSIS — Z20822 Contact with and (suspected) exposure to covid-19: Secondary | ICD-10-CM | POA: Diagnosis present

## 2020-10-12 DIAGNOSIS — Z992 Dependence on renal dialysis: Secondary | ICD-10-CM

## 2020-10-12 DIAGNOSIS — M79602 Pain in left arm: Secondary | ICD-10-CM | POA: Diagnosis present

## 2020-10-12 DIAGNOSIS — Z923 Personal history of irradiation: Secondary | ICD-10-CM

## 2020-10-12 DIAGNOSIS — G4733 Obstructive sleep apnea (adult) (pediatric): Secondary | ICD-10-CM | POA: Diagnosis present

## 2020-10-12 LAB — COMPREHENSIVE METABOLIC PANEL
ALT: 26 U/L (ref 0–44)
AST: 40 U/L (ref 15–41)
Albumin: 1.9 g/dL — ABNORMAL LOW (ref 3.5–5.0)
Alkaline Phosphatase: 643 U/L — ABNORMAL HIGH (ref 38–126)
Anion gap: 16 — ABNORMAL HIGH (ref 5–15)
BUN: 14 mg/dL (ref 8–23)
CO2: 28 mmol/L (ref 22–32)
Calcium: 7.8 mg/dL — ABNORMAL LOW (ref 8.9–10.3)
Chloride: 96 mmol/L — ABNORMAL LOW (ref 98–111)
Creatinine, Ser: 3.13 mg/dL — ABNORMAL HIGH (ref 0.61–1.24)
GFR, Estimated: 20 mL/min — ABNORMAL LOW (ref >60)
Glucose, Bld: 114 mg/dL — ABNORMAL HIGH (ref 70–99)
Potassium: 3.5 mmol/L (ref 3.5–5.1)
Sodium: 140 mmol/L (ref 135–145)
Total Bilirubin: 3.3 mg/dL — ABNORMAL HIGH (ref 0.3–1.2)
Total Protein: 6.1 g/dL — ABNORMAL LOW (ref 6.5–8.1)

## 2020-10-12 LAB — CBC WITH DIFFERENTIAL/PLATELET
Abs Immature Granulocytes: 1.05 10*3/uL — ABNORMAL HIGH (ref 0.00–0.07)
Basophils Absolute: 0.1 10*3/uL (ref 0.0–0.1)
Basophils Relative: 0 %
Eosinophils Absolute: 0 10*3/uL (ref 0.0–0.5)
Eosinophils Relative: 0 %
HCT: 31.8 % — ABNORMAL LOW (ref 39.0–52.0)
Hemoglobin: 9.7 g/dL — ABNORMAL LOW (ref 13.0–17.0)
Immature Granulocytes: 7 %
Lymphocytes Relative: 3 %
Lymphs Abs: 0.5 10*3/uL — ABNORMAL LOW (ref 0.7–4.0)
MCH: 32.1 pg (ref 26.0–34.0)
MCHC: 30.5 g/dL (ref 30.0–36.0)
MCV: 105.3 fL — ABNORMAL HIGH (ref 80.0–100.0)
Monocytes Absolute: 2.2 10*3/uL — ABNORMAL HIGH (ref 0.1–1.0)
Monocytes Relative: 15 %
Neutro Abs: 11 10*3/uL — ABNORMAL HIGH (ref 1.7–7.7)
Neutrophils Relative %: 75 %
Platelets: 229 10*3/uL (ref 150–400)
RBC: 3.02 MIL/uL — ABNORMAL LOW (ref 4.22–5.81)
RDW: 20.4 % — ABNORMAL HIGH (ref 11.5–15.5)
WBC: 14.8 10*3/uL — ABNORMAL HIGH (ref 4.0–10.5)
nRBC: 0 % (ref 0.0–0.2)

## 2020-10-12 LAB — I-STAT VENOUS BLOOD GAS, ED
Acid-Base Excess: 7 mmol/L — ABNORMAL HIGH (ref 0.0–2.0)
Bicarbonate: 33.4 mmol/L — ABNORMAL HIGH (ref 20.0–28.0)
Calcium, Ion: 0.92 mmol/L — ABNORMAL LOW (ref 1.15–1.40)
HCT: 28 % — ABNORMAL LOW (ref 39.0–52.0)
Hemoglobin: 9.5 g/dL — ABNORMAL LOW (ref 13.0–17.0)
O2 Saturation: 96 %
Potassium: 3.3 mmol/L — ABNORMAL LOW (ref 3.5–5.1)
Sodium: 141 mmol/L (ref 135–145)
TCO2: 35 mmol/L — ABNORMAL HIGH (ref 22–32)
pCO2, Ven: 54 mmHg (ref 44.0–60.0)
pH, Ven: 7.4 (ref 7.250–7.430)
pO2, Ven: 88 mmHg — ABNORMAL HIGH (ref 32.0–45.0)

## 2020-10-12 LAB — I-STAT CHEM 8, ED
BUN: 14 mg/dL (ref 8–23)
Calcium, Ion: 0.94 mmol/L — ABNORMAL LOW (ref 1.15–1.40)
Chloride: 97 mmol/L — ABNORMAL LOW (ref 98–111)
Creatinine, Ser: 2.9 mg/dL — ABNORMAL HIGH (ref 0.61–1.24)
Glucose, Bld: 112 mg/dL — ABNORMAL HIGH (ref 70–99)
HCT: 27 % — ABNORMAL LOW (ref 39.0–52.0)
Hemoglobin: 9.2 g/dL — ABNORMAL LOW (ref 13.0–17.0)
Potassium: 3.3 mmol/L — ABNORMAL LOW (ref 3.5–5.1)
Sodium: 140 mmol/L (ref 135–145)
TCO2: 31 mmol/L (ref 22–32)

## 2020-10-12 LAB — LACTIC ACID, PLASMA: Lactic Acid, Venous: 3.5 mmol/L (ref 0.5–1.9)

## 2020-10-12 LAB — TROPONIN I (HIGH SENSITIVITY): Troponin I (High Sensitivity): 27 ng/L — ABNORMAL HIGH (ref <18)

## 2020-10-12 MED ORDER — LACTATED RINGERS IV BOLUS: 500.0000 mL | Freq: Once | INTRAVENOUS | Status: DC

## 2020-10-12 MED ORDER — LACTATED RINGERS IV BOLUS
500.0000 mL | Freq: Once | INTRAVENOUS | Status: AC
  Administered 2020-10-12: 500 mL via INTRAVENOUS

## 2020-10-12 MED ORDER — PIPERACILLIN-TAZOBACTAM 3.375 G IVPB 30 MIN
3.3750 g | Freq: Once | INTRAVENOUS | Status: AC
  Administered 2020-10-12: 3.375 g via INTRAVENOUS
  Filled 2020-10-12: qty 50

## 2020-10-12 MED ORDER — SODIUM CHLORIDE 0.9 % IV BOLUS
500.0000 mL | Freq: Once | INTRAVENOUS | Status: AC
  Administered 2020-10-12: 500 mL via INTRAVENOUS

## 2020-10-12 MED ORDER — SODIUM CHLORIDE 0.9 % IV SOLN
2.2500 g | Freq: Three times a day (TID) | INTRAVENOUS | Status: DC
  Administered 2020-10-13 – 2020-10-17 (×12): 2.25 g via INTRAVENOUS
  Filled 2020-10-12 (×13): qty 10
  Filled 2020-10-12: qty 2.25
  Filled 2020-10-12 (×2): qty 10

## 2020-10-12 MED ORDER — VANCOMYCIN HCL IN DEXTROSE 750-5 MG/150ML-% IV SOLN: 750.0000 mg | INTRAVENOUS | Status: DC

## 2020-10-12 MED ORDER — VANCOMYCIN HCL 1500 MG/300ML IV SOLN
1500.0000 mg | Freq: Once | INTRAVENOUS | Status: AC
  Administered 2020-10-12: 1500 mg via INTRAVENOUS
  Filled 2020-10-12: qty 300

## 2020-10-12 NOTE — ED Triage Notes (Addendum)
Pt arrived to ED via EMS from home w/ c/o of increased weakness since dialyisis tx today. Pt goes MWF and has fistula in L arm. EMS VS: BP 90/53, HR 107, 100% on 5L via [place] (pt is on 3L at baseline and was noted to be sating 88% upon EMS arrival on 3L), CBG 118.   Pt is A&Ox 4 w/ delayed responses and reports 5L removed during dialysis.

## 2020-10-12 NOTE — ED Notes (Signed)
Pt transported to CT at this time.

## 2020-10-12 NOTE — ED Notes (Signed)
IV attempt unsuccessful. Notified MD and placed IV team consult

## 2020-10-12 NOTE — ED Provider Notes (Signed)
[name] Provider Note   CSN: [id number] Arrival date & time: 10/12/20  1955     History Chief Complaint  Patient presents with  . Weakness    Generalized    [name] is a 70 y.o. male.  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  History limited due to acuity.  History obtained from patient, wife, chart review.  Wife reports that patient was feeling fatigued this morning, seemed mildly confused.  Went to dialysis and afterwards continued to be more lethargic.  Patient reports that he just feels generally weak and has no energy.  Denies any known fevers or chills.  No cough or difficulty in breathing.  He denies chest pain back pain or abdominal pain.  HPI     Past Medical History:  Diagnosis Date  . Aortic valve disorders   . Benign neoplasm of colon   . Degeneration of intervertebral disc, site unspecified   . Diabetes mellitus without complication ([place])   . 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 Esophageal reflux   . ESRD (end stage renal disease) on dialysis ([place])   . Essential hypertension   . Obstructive sleep apnea (adult) (pediatric)   . Osteoarthrosis, unspecified whether generalized or localized, unspecified site   . Other and unspecified hyperlipidemia   . Pneumonia    interstitial pneumonia  . Pulmonary fibrosis ([place])   . Renal disorder   . Respiratory failure with hypoxia ([place]) 12/2015  . Transplanted, lung ([place])   . Unspecified essential hypertension     Patient Active Problem List   Diagnosis Date Noted  . Anemia 09/26/2020  . Exercise hypoxemia   . Pneumonia 07/17/2020  . Lung mass 07/17/2020  . Acute bronchiolitis due to human metapneumovirus 04/25/2020  . Sepsis with acute organ  dysfunction ([place]) 01/08/2020  . Lactic acidosis 01/08/2020  . Mixed diabetic hyperlipidemia associated with type 2 diabetes mellitus ([place]) 01/08/2020  . Hepatic cirrhosis ([place]) 01/08/2020  . Uncontrolled type 2 diabetes mellitus with hyperglycemia, with long-term current use of insulin ([place]) 01/08/2020  . IV infiltrate, initial encounter 01/08/2020  . Acute diarrhea 01/08/2020  . Acute metabolic encephalopathy 81/27/5170  . 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 AV fistula infection, initial encounter ([place]) 01/14/2019  . Immunocompromised state ([place]) 01/14/2019  . Wound infection 01/13/2019  . Sepsis ([place])   . CKD (chronic kidney disease)   . Generalized weakness 12/30/2015  . ESRD (end stage renal disease) ([place])   . 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[place])   . Acute respiratory failure with hypoxemia ([place]) 04/18/2015  . Septic shock ([place]) 04/18/2015  . Acute encephalopathy 04/18/2015  . Acute respiratory failure with hypoxia ([place]) 04/18/2015  . Cardiac arrest ([place])   . HCAP (healthcare-associated pneumonia)   . Elevated rheumatoid factor 05/09/2014  . Essential hypertension 05/09/2014  . ILD (interstitial lung disease) ([place]) 05/09/2014  . Obstructive apnea 05/09/2014  . Lung nodule, solitary 04/18/2014  . Awaiting organ transplant 04/18/2014  . Acute on chronic respiratory failure with hypoxia ([place]) 08/15/2013  . Edema 07/05/2013  . Chronic respiratory failure ([place]) 03/03/2013  . Diabetes mellitus with complication ([place]) 01/74/9449  . Acute sinusitis 05/04/2011  . Cough 11/10/2010  . 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 Obstructive sleep apnea 10/09/2008  . GERD without esophagitis 10/09/2008  . HIATAL  HERNIA 10/09/2008  . OSTEOARTHRITIS 10/09/2008    Past Surgical History:  Procedure Laterality Date  . COLONOSCOPY WITH PROPOFOL N/A 11/07/2017   Procedure: COLONOSCOPY WITH PROPOFOL;  Surgeon: Ronnette Juniper, MD;  Location: [facility];  Service:  Gastroenterology;  Laterality: N/A;  . LIGATION OF ARTERIOVENOUS  FISTULA Left 01/14/2019   Procedure: LIGATION OF ARTERIOVENOUS  FISTULA;  Surgeon: Marty Heck, MD;  Location: [facility];  Service: Vascular;  Laterality: Left;  . LUNG BIOPSY  2010  . LUNG TRANSPLANT, SINGLE Right   . POLYPECTOMY  11/07/2017   Procedure: POLYPECTOMY;  Surgeon: Ronnette Juniper, MD;  Location: [facility];  Service: Gastroenterology;;       Family History  Problem Relation Age of Onset  . Pancreatic cancer Brother   . Heart disease Father     Social History   Tobacco Use  . Smoking status: Former Smoker    Packs/day: 0.30    Years: 20.00    Pack years: 6.00    Types: Cigarettes    Quit date: 06/06/2002    Years since quitting: 18.3  . Smokeless tobacco: Never Used  Vaping Use  . Vaping Use: Never used  Substance Use Topics  . Alcohol use: No    Alcohol/week: 0.0 standard drinks  . Drug use: No    Home Medications Prior to Admission medications   Medication Sig Start Date End Date Taking? Authorizing Provider  cycloSPORINE (SANDIMMUNE) 25 MG capsule Take 125 mg by mouth 2 (two) times daily.   Yes [provider]  ACCU-CHEK FASTCLIX LANCETS MISC "As directed up to 4 times daily" 12/16/15   [provider]  acetaminophen (TYLENOL) 160 MG/5ML suspension Place 649.6 mg into feeding tube every 6 (six) hours as needed. 09/15/20   [provider]  albuterol (VENTOLIN HFA) 108 (90 Base) MCG/ACT inhaler Inhale 1-2 puffs into the lungs daily as needed for wheezing or shortness of breath. 05/12/20   [provider]  aspirin 81 MG tablet Take 81 mg by mouth daily with breakfast.    [provider]  cycloSPORINE (GENGRAF) 100 MG/ML microemulsion solution Take 100 mg by mouth every 12 (twelve) hours.    [provider]  dorzolamidel-timolol (COSOPT PF) 22.3-6.8 MG/ML SOLN ophthalmic solution Place 1 drop into both eyes 2 (two) times daily.    [provider]  fluticasone (FLONASE) 50 MCG/ACT nasal spray Place 2 sprays into the nose daily. Patient taking differently: Place 2 sprays into the nose daily as needed for allergies. 03/05/13   Wenda Low, MD  insulin regular (NOVOLIN R) 100 units/mL injection Inject into the skin See admin instructions. Inject per sliding scale: if 0-199=0; 200-250=1; 251-300=2; 301-350=3; 351-400=4 and call MD, subcutaneously four times a day. Give in addition to schedule amount.    [provider]  lidocaine (LIDODERM) 5 % Place 1 patch onto the skin daily. Place 1 patch onto the skin daily for 30 days Apply patch to the most painful area for up to 12 hours in a 24 hour period. 09/16/20 10/16/20  [provider]  Loperamide HCl 1 MG/7.5ML LIQD Take 15 mLs (2 mg total) by G tube 3 (three) times daily as needed for Diarrhea 09/15/20   [provider]  midodrine (PROAMATINE) 10 MG tablet Place 10 mg into feeding tube daily as needed (for hypotension prior to PT/OT PRN for BP).    [provider]  midodrine (PROAMATINE) 10 MG tablet Place 10 mg into feeding tube 2 (two) times  daily. Give before dialysis    [provider]  Multiple Vitamin (MULTIVITAMIN WITH MINERALS) TABS tablet Place 1 tablet into feeding tube daily. Centrum for Men 50 plus: on Monday, Wednesday, Friday (dialysis days): take one tablet daily with breakfast; on Sunday, Tuesday, Thursday, Saturday: take one tablet daily with supper    [provider]  Nutritional Supplements (FEEDING SUPPLEMENT, NEPRO CARB STEADY,) LIQD Place 1,000 mLs into feeding tube daily. 09/28/20   Fritzi Mandes, MD  omeprazole (FIRST-OMEPRAZOLE) 2 mg/mL SUSP oral suspension Place 40 mg into feeding tube daily.    [provider]  ondansetron (ZOFRAN) 4 MG tablet Place 4 mg into feeding tube every 6 (six) hours as needed for nausea or vomiting.    [provider]  oxyCODONE (ROXICODONE) 5 MG immediate release  tablet Take 1 tablet (5 mg total) by mouth every 4 (four) hours as needed for severe pain. 10/11/20   Tacy Learn, PA-C  pravastatin (PRAVACHOL) 10 MG tablet Place 10 mg into feeding tube at bedtime. 03/16/20   [provider]  predniSONE (DELTASONE) 5 MG tablet Take 1 tablet (5 mg total) by G tube once daily . Crush and administer via GTube daily. 09/16/20 09/16/21  [provider]  sulfamethoxazole-trimethoprim (BACTRIM,SEPTRA) 400-80 MG tablet Place 1 tablet into feeding tube See admin instructions. On Monday, Wednesday, Friday (dialysis days): take one tablet by mouth daily with supper 02/27/17   [provider]  valganciclovir (VALCYTE) 50 MG/ML SOLR Take 450 mg by mouth See admin instructions. Take one tablet (450 mg) by mouth on Monday, Wednesday, and Friday after dialysis 03/26/15   [provider]  vitamin B-6 (PYRIDOXINE) 25 MG tablet Place 25 mg into feeding tube daily with breakfast.    [provider]    Allergies    Cefepime, Levofloxacin, Nsaids, and Tolmetin  Review of Systems   Review of Systems  Unable to perform ROS: Acuity of condition    Physical Exam Updated Vital Signs BP (!) 94/54 (BP Location: Right Arm)   Pulse 97   Temp 99.7 F (37.6 C) (Axillary)   Resp 13   Ht 5' 3.5" (1.613 m)   Wt 76.2 kg Comment: pt reports this was his weight prior to dialysis today  SpO2 99%   BMI 29.29 kg/m   Physical Exam Vitals and nursing note reviewed.  Constitutional:      Appearance: He is well-developed.     Comments: Mildly lethargic but readily aroused and answering basic questions  HENT:     Head: Normocephalic and atraumatic.  Eyes:     Conjunctiva/sclera: Conjunctivae normal.  Cardiovascular:     Rate and Rhythm: Regular rhythm. Tachycardia present.     Heart sounds: No murmur heard.   Pulmonary:     Effort: Pulmonary effort is normal. No respiratory distress.     Breath sounds: Normal breath sounds.  Abdominal:      Palpations: Abdomen is soft.     Tenderness: There is no abdominal tenderness.  Musculoskeletal:     Cervical back: Neck supple.     Comments: Left upper extremity fistula intact  Skin:    General: Skin is warm and dry.  Neurological:     Mental Status: He is alert.     Comments: Mildly lethargic but readily arousable and answering basic questions appropriately     ED Results / Procedures / Treatments   Labs (all labs ordered are listed, but only abnormal results are displayed) Labs Reviewed  CBC  WITH DIFFERENTIAL/PLATELET - Abnormal; Notable for the following components:      Result Value   WBC 14.8 (*)    RBC 3.02 (*)    Hemoglobin 9.7 (*)    HCT 31.8 (*)    MCV 105.3 (*)    RDW 20.4 (*)    Neutro Abs 11.0 (*)    Lymphs Abs 0.5 (*)    Monocytes Absolute 2.2 (*)    Abs Immature Granulocytes 1.05 (*)    All other components within normal limits  COMPREHENSIVE METABOLIC PANEL - Abnormal; Notable for the following components:   Chloride 96 (*)    Glucose, Bld 114 (*)    Creatinine, Ser 3.13 (*)    Calcium 7.8 (*)    Total Protein 6.1 (*)    Albumin 1.9 (*)    Alkaline Phosphatase 643 (*)    Total Bilirubin 3.3 (*)    GFR, Estimated 20 (*)    Anion gap 16 (*)    All other components within normal limits  LACTIC ACID, PLASMA - Abnormal; Notable for the following components:   Lactic Acid, Venous 3.5 (*)    All other components within normal limits  I-STAT CHEM 8, ED - Abnormal; Notable for the following components:   Potassium 3.3 (*)    Chloride 97 (*)    Creatinine, Ser 2.90 (*)    Glucose, Bld 112 (*)    Calcium, Ion 0.94 (*)    Hemoglobin 9.2 (*)    HCT 27.0 (*)    All other components within normal limits  I-STAT VENOUS BLOOD GAS, ED - Abnormal; Notable for the following components:   pO2, Ven 88.0 (*)    Bicarbonate 33.4 (*)    TCO2 35 (*)    Acid-Base Excess 7.0 (*)    Potassium 3.3 (*)    Calcium, Ion 0.92 (*)    HCT 28.0 (*)    Hemoglobin 9.5 (*)     All other components within normal limits  TROPONIN I (HIGH SENSITIVITY) - Abnormal; Notable for the following components:   Troponin I (High Sensitivity) 27 (*)    All other components within normal limits  RESP PANEL BY RT-PCR (FLU A&B, COVID) ARPGX2  CULTURE, BLOOD (ROUTINE X 2)  CULTURE, BLOOD (ROUTINE X 2)  LACTIC ACID, PLASMA  BLOOD GAS, VENOUS  TROPONIN I (HIGH SENSITIVITY)    EKG EKG Interpretation  Date/Time:  Monday Oct 12 2020 20:22:45 EDT Ventricular Rate:  103 PR Interval:  125 QRS Duration: 134 QT Interval:  395 QTC Calculation: 518 R Axis:   -31 Text Interpretation: Sinus tachycardia Right bundle branch block Confirmed by Madalyn Rob [phone]) on 10/12/2020 8:50:40 PM   Radiology CT Head Wo Contrast  Result Date: 10/12/2020 CLINICAL DATA:  Weakness elevated blood pressure EXAM: CT HEAD WITHOUT CONTRAST TECHNIQUE: Contiguous axial images were obtained from the base of the skull through the vertex without intravenous contrast. COMPARISON:  CT brain 04/24/2020, 02/05/2019 FINDINGS: Brain: No acute territorial infarction, hemorrhage or intracranial mass. Chronic lacunar infarct or prominent perivascular space in the right subinsular region without change. Stable ventricle size. Vascular: No hyperdense vessels.  Carotid vascular calcification Skull: Normal. Negative for fracture or focal lesion. Sinuses/Orbits: Patchy mucosal thickening in the sinuses Other: None IMPRESSION: No CT evidence for acute intracranial abnormality. Electronically Signed   By: Donavan Foil M.D.   On: 10/12/2020 21:51   DG Chest Portable 1 View  Result Date: 10/12/2020 CLINICAL DATA:  Hypotension following dialysis EXAM: PORTABLE CHEST 1  VIEW COMPARISON:  09/27/2020 FINDINGS: Cardiac shadow is stable. Aortic calcifications are noted. Vascular stents are noted in the region of the left shoulder and left innominate vein stable from the prior exam. Previously seen tunneled PICC line on the right has  been removed. Postsurgical changes are noted. Chronic fibrotic changes in the left lung are again noted. No acute infiltrate is seen. IMPRESSION: Chronic changes without acute abnormality. Electronically Signed   By: Inez Catalina M.D.   On: 10/12/2020 21:03    Procedures .Critical Care Performed by: Lucrezia Starch, MD Authorized by: Lucrezia Starch, MD   Critical care provider statement:    Critical care time (minutes):  61   Critical care was necessary to treat or prevent imminent or life-threatening deterioration of the following conditions:  Sepsis and shock   Critical care was time spent personally by me on the following activities:  Discussions with consultants, evaluation of patient's response to treatment, examination of patient, ordering and performing treatments and interventions, ordering and review of laboratory studies, ordering and review of radiographic studies, pulse oximetry, re-evaluation of patient's condition, obtaining history from patient or surrogate and review of old charts     Medications Ordered in ED Medications  vancomycin (VANCOREADY) IVPB 1500 mg/300 mL (1,500 mg Intravenous New Bag/Given 10/12/20 2258)  piperacillin-tazobactam (ZOSYN) 2.25 g in sodium chloride 0.9 % 50 mL IVPB (has no administration in time range)  vancomycin (VANCOCIN) IVPB 750 mg/150 ml premix (has no administration in time range)  lactated ringers bolus 500 mL (0 mLs Intravenous Stopped 10/12/20 2141)  piperacillin-tazobactam (ZOSYN) IVPB 3.375 g (0 g Intravenous Stopped 10/12/20 2250)  sodium chloride 0.9 % bolus 500 mL (0 mLs Intravenous Stopped 10/12/20 2303)  lactated ringers bolus 500 mL (0 mLs Intravenous Hold 10/12/20 2345)    ED Course  I have reviewed the triage vital signs and the nursing notes.  Pertinent labs & imaging results that were available during my care of the patient were reviewed by me and considered in my medical decision making (see chart for details).  Clinical  Course as of 10/13/20 0022  Mon Oct 12, 2020  2238 Latest BP downtrending - will give additional fluid bolus and reassess [RD]    Clinical Course User Index [RD] Lucrezia Starch, MD   MDM Rules/Calculators/A&P                         70 year old dialysis patient, lung transplant patient presenting to ER with concern for generalized fatigue, weakness, low blood pressure.  On exam, patient noted to be somewhat lethargic but readily arousable, BP soft but remainder of vitals were stable.  Repeat blood pressure trended down.  Obtain broad work-up.  CXR negative.  CT head negative.  EKG without obvious ischemic change, troponin minimally elevated, doubt ACS.  Basic labs concerning for elevation of lactic acid and leukocytosis.  Given his transplant meds, he is immune compromised and at higher risk for serious bacterial illness though he denied specific infectious symptoms.  Sent blood cultures and started broad-spectrum antibiotics.  Patient's blood pressure responded to the initial small fluid bolus.  Hesitant to fluid overload patient given dialysis status.  Initially had reached out to hospitalist however blood pressure trended down and did not readily respond to repeat fluid bolus.  Reached out to critical care, Dr. Earlie Server agreed to evaluate and likely admit patient.  While awaiting eval by critical care, signed out to [facility].  Final Clinical Impression(s) /  ED Diagnoses Final diagnoses:  Shock ([place])  Lactic acidosis  Leukocytosis, unspecified type    Rx / DC Orders ED Discharge Orders    None       Lucrezia Starch, MD 10/13/20 [phone]

## 2020-10-12 NOTE — ED Notes (Signed)
Notified MD about pt BP trending downward and about axillary temp

## 2020-10-12 NOTE — ED Notes (Signed)
Called CT to notify that pt is ready for CT at this time

## 2020-10-12 NOTE — ED Notes (Signed)
Dykstra MD aware of only 1 set of blood cultures being collected and is ok with that

## 2020-10-12 NOTE — Progress Notes (Signed)
Pharmacy Antibiotic Note  [name] is a 70 y.o. male admitted on 10/12/2020 with sepsis.  Pharmacy has been consulted for Zosyn and vancomycin dosing.  WBC and LA elevated. H/o ESRD on MWF HD with last HD session earlier today    Plan: -Zosyn 3.75 gm IV once, then start zosyn 2.25 gm IV Q 8 hours -Vancomycin 1500 mg IV load followed by vancomycin 750 mg IV Q HD. F/u plans for inpatient HD  -Monitor CBC, cultures and clinical progress -Vanc levels as indicated   Height: 5' 3.5" (161.3 cm) Weight: 76.2 kg (168 lb) (pt reports this was his weight prior to dialysis today) IBW/kg (Calculated) : 58.05  Temp (24hrs), Avg:98.6 F (37 C), Min:98.6 F (37 C), Max:98.6 F (37 C)  Recent Labs  Lab 10/12/20 2038 10/12/20 2119  WBC 14.8*  --   CREATININE  --  2.90*  LATICACIDVEN 3.5*  --     Estimated Creatinine Clearance: 21.6 mL/min (A) (by C-G formula based on SCr of 2.9 mg/dL (H)).    Allergies  Allergen Reactions  . Cefepime Other (See Comments)    Altered mental status  . Levofloxacin Other (See Comments)    LOSS OF CONSCIOUSNESS  . Nsaids Other (See Comments)    Patient instructed not to take NSAID's after his lung transplant  . Tolmetin     Antimicrobials this admission: Zosyn 5/9 >>  Vancomycin 5/9 >>   Dose adjustments this admission:  Microbiology results: 5/9 BCx:    Thank you for allowing pharmacy to be a part of this patient's care.  Albertina Parr, PharmD., BCPS, BCCCP Clinical Pharmacist Please refer to [facility] for unit-specific pharmacist

## 2020-10-13 DIAGNOSIS — N2581 Secondary hyperparathyroidism of renal origin: Secondary | ICD-10-CM | POA: Diagnosis present

## 2020-10-13 DIAGNOSIS — G4733 Obstructive sleep apnea (adult) (pediatric): Secondary | ICD-10-CM | POA: Diagnosis present

## 2020-10-13 DIAGNOSIS — R54 Age-related physical debility: Secondary | ICD-10-CM | POA: Diagnosis present

## 2020-10-13 DIAGNOSIS — E1122 Type 2 diabetes mellitus with diabetic chronic kidney disease: Secondary | ICD-10-CM | POA: Diagnosis present

## 2020-10-13 DIAGNOSIS — N186 End stage renal disease: Secondary | ICD-10-CM | POA: Diagnosis present

## 2020-10-13 DIAGNOSIS — T82898A Other specified complication of vascular prosthetic devices, implants and grafts, initial encounter: Secondary | ICD-10-CM | POA: Diagnosis not present

## 2020-10-13 DIAGNOSIS — E8889 Other specified metabolic disorders: Secondary | ICD-10-CM | POA: Diagnosis present

## 2020-10-13 DIAGNOSIS — E785 Hyperlipidemia, unspecified: Secondary | ICD-10-CM | POA: Diagnosis present

## 2020-10-13 DIAGNOSIS — E43 Unspecified severe protein-calorie malnutrition: Secondary | ICD-10-CM | POA: Diagnosis present

## 2020-10-13 DIAGNOSIS — K7460 Unspecified cirrhosis of liver: Secondary | ICD-10-CM | POA: Diagnosis present

## 2020-10-13 DIAGNOSIS — K219 Gastro-esophageal reflux disease without esophagitis: Secondary | ICD-10-CM | POA: Diagnosis present

## 2020-10-13 DIAGNOSIS — Z20822 Contact with and (suspected) exposure to covid-19: Secondary | ICD-10-CM | POA: Diagnosis present

## 2020-10-13 DIAGNOSIS — R571 Hypovolemic shock: Secondary | ICD-10-CM | POA: Diagnosis present

## 2020-10-13 DIAGNOSIS — I959 Hypotension, unspecified: Secondary | ICD-10-CM | POA: Diagnosis not present

## 2020-10-13 DIAGNOSIS — A419 Sepsis, unspecified organism: Secondary | ICD-10-CM

## 2020-10-13 DIAGNOSIS — Z992 Dependence on renal dialysis: Secondary | ICD-10-CM | POA: Diagnosis not present

## 2020-10-13 DIAGNOSIS — R0602 Shortness of breath: Secondary | ICD-10-CM | POA: Diagnosis not present

## 2020-10-13 DIAGNOSIS — D84821 Immunodeficiency due to drugs: Secondary | ICD-10-CM | POA: Diagnosis present

## 2020-10-13 DIAGNOSIS — N185 Chronic kidney disease, stage 5: Secondary | ICD-10-CM | POA: Diagnosis not present

## 2020-10-13 DIAGNOSIS — E861 Hypovolemia: Secondary | ICD-10-CM | POA: Diagnosis not present

## 2020-10-13 DIAGNOSIS — I953 Hypotension of hemodialysis: Secondary | ICD-10-CM | POA: Diagnosis present

## 2020-10-13 DIAGNOSIS — Z931 Gastrostomy status: Secondary | ICD-10-CM | POA: Diagnosis not present

## 2020-10-13 DIAGNOSIS — J8410 Pulmonary fibrosis, unspecified: Secondary | ICD-10-CM | POA: Diagnosis not present

## 2020-10-13 DIAGNOSIS — Z942 Lung transplant status: Secondary | ICD-10-CM | POA: Diagnosis not present

## 2020-10-13 DIAGNOSIS — I9589 Other hypotension: Secondary | ICD-10-CM | POA: Diagnosis present

## 2020-10-13 DIAGNOSIS — I120 Hypertensive chronic kidney disease with stage 5 chronic kidney disease or end stage renal disease: Secondary | ICD-10-CM | POA: Diagnosis present

## 2020-10-13 DIAGNOSIS — R200 Anesthesia of skin: Secondary | ICD-10-CM | POA: Diagnosis present

## 2020-10-13 DIAGNOSIS — N250 Renal osteodystrophy: Secondary | ICD-10-CM | POA: Diagnosis not present

## 2020-10-13 DIAGNOSIS — D631 Anemia in chronic kidney disease: Secondary | ICD-10-CM | POA: Diagnosis present

## 2020-10-13 DIAGNOSIS — M79602 Pain in left arm: Secondary | ICD-10-CM | POA: Diagnosis present

## 2020-10-13 DIAGNOSIS — E876 Hypokalemia: Secondary | ICD-10-CM | POA: Diagnosis not present

## 2020-10-13 DIAGNOSIS — E872 Acidosis: Secondary | ICD-10-CM | POA: Diagnosis present

## 2020-10-13 LAB — GLUCOSE, CAPILLARY
Glucose-Capillary: 89 mg/dL (ref 70–99)
Glucose-Capillary: 146 mg/dL — ABNORMAL HIGH (ref 70–99)
Glucose-Capillary: 117 mg/dL — ABNORMAL HIGH (ref 70–99)
Glucose-Capillary: 242 mg/dL — ABNORMAL HIGH (ref 70–99)

## 2020-10-13 LAB — HEMOGLOBIN A1C
Hgb A1c MFr Bld: 5.2 % (ref 4.8–5.6)
Mean Plasma Glucose: 102.54 mg/dL

## 2020-10-13 LAB — BASIC METABOLIC PANEL
Anion gap: 14 (ref 5–15)
BUN: 12 mg/dL (ref 8–23)
CO2: 28 mmol/L (ref 22–32)
Calcium: 7.8 mg/dL — ABNORMAL LOW (ref 8.9–10.3)
Chloride: 100 mmol/L (ref 98–111)
Creatinine, Ser: 3.29 mg/dL — ABNORMAL HIGH (ref 0.61–1.24)
GFR, Estimated: 19 mL/min — ABNORMAL LOW (ref >60)
Glucose, Bld: 91 mg/dL (ref 70–99)
Potassium: 3.2 mmol/L — ABNORMAL LOW (ref 3.5–5.1)
Sodium: 142 mmol/L (ref 135–145)

## 2020-10-13 LAB — CBC
HCT: 25.8 % — ABNORMAL LOW (ref 39.0–52.0)
HCT: 30.9 % — ABNORMAL LOW (ref 39.0–52.0)
Hemoglobin: 7.8 g/dL — ABNORMAL LOW (ref 13.0–17.0)
Hemoglobin: 9.2 g/dL — ABNORMAL LOW (ref 13.0–17.0)
MCH: 32.1 pg (ref 26.0–34.0)
MCH: 31.9 pg (ref 26.0–34.0)
MCHC: 30.2 g/dL (ref 30.0–36.0)
MCHC: 29.8 g/dL — ABNORMAL LOW (ref 30.0–36.0)
MCV: 106.2 fL — ABNORMAL HIGH (ref 80.0–100.0)
MCV: 107.3 fL — ABNORMAL HIGH (ref 80.0–100.0)
Platelets: 201 10*3/uL (ref 150–400)
Platelets: 206 10*3/uL (ref 150–400)
RBC: 2.43 MIL/uL — ABNORMAL LOW (ref 4.22–5.81)
RBC: 2.88 MIL/uL — ABNORMAL LOW (ref 4.22–5.81)
RDW: 20.4 % — ABNORMAL HIGH (ref 11.5–15.5)
RDW: 20.2 % — ABNORMAL HIGH (ref 11.5–15.5)
WBC: 17 10*3/uL — ABNORMAL HIGH (ref 4.0–10.5)
WBC: 18.3 10*3/uL — ABNORMAL HIGH (ref 4.0–10.5)
nRBC: 0 % (ref 0.0–0.2)
nRBC: 0 % (ref 0.0–0.2)

## 2020-10-13 LAB — RESP PANEL BY RT-PCR (FLU A&B, COVID) ARPGX2
Influenza A by PCR: NEGATIVE
Influenza B by PCR: NEGATIVE
SARS Coronavirus 2 by RT PCR: NEGATIVE

## 2020-10-13 LAB — CREATININE, SERUM
Creatinine, Ser: 3.27 mg/dL — ABNORMAL HIGH (ref 0.61–1.24)
GFR, Estimated: 19 mL/min — ABNORMAL LOW (ref >60)

## 2020-10-13 LAB — MAGNESIUM: Magnesium: 1.9 mg/dL (ref 1.7–2.4)

## 2020-10-13 LAB — LACTIC ACID, PLASMA: Lactic Acid, Venous: 1.5 mmol/L (ref 0.5–1.9)

## 2020-10-13 LAB — TROPONIN I (HIGH SENSITIVITY): Troponin I (High Sensitivity): 32 ng/L — ABNORMAL HIGH (ref <18)

## 2020-10-13 LAB — MRSA PCR SCREENING: MRSA by PCR: NEGATIVE

## 2020-10-13 LAB — PHOSPHORUS: Phosphorus: 4.1 mg/dL (ref 2.5–4.6)

## 2020-10-13 MED ORDER — HEPARIN SODIUM (PORCINE) 5000 UNIT/ML IJ SOLN
5000.0000 [IU] | Freq: Three times a day (TID) | INTRAMUSCULAR | Status: DC
  Administered 2020-10-13 – 2020-10-18 (×14): 5000 [IU] via SUBCUTANEOUS
  Filled 2020-10-13 (×14): qty 1

## 2020-10-13 MED ORDER — WHITE PETROLATUM EX OINT
TOPICAL_OINTMENT | CUTANEOUS | Status: AC
  Filled 2020-10-13: qty 28.35

## 2020-10-13 MED ORDER — POLYETHYLENE GLYCOL 3350 17 G PO PACK: 17.0000 g | PACK | Freq: Every day | ORAL | Status: DC | PRN

## 2020-10-13 MED ORDER — DORZOLAMIDE HCL-TIMOLOL MAL 2-0.5 % OP SOLN
1.0000 [drp] | Freq: Two times a day (BID) | OPHTHALMIC | Status: DC
  Administered 2020-10-14 – 2020-10-18 (×10): 1 [drp] via OPHTHALMIC
  Filled 2020-10-13 (×2): qty 10

## 2020-10-13 MED ORDER — CHLORHEXIDINE GLUCONATE CLOTH 2 % EX PADS
6.0000 | MEDICATED_PAD | Freq: Every day | CUTANEOUS | Status: DC
  Administered 2020-10-13 – 2020-10-18 (×6): 6 via TOPICAL

## 2020-10-13 MED ORDER — MIDODRINE HCL 5 MG PO TABS
10.0000 mg | ORAL_TABLET | Freq: Two times a day (BID) | ORAL | Status: DC
  Administered 2020-10-13 – 2020-10-18 (×11): 10 mg via ORAL
  Filled 2020-10-13 (×10): qty 2

## 2020-10-13 MED ORDER — CYCLOSPORINE MODIFIED (GENGRAF) 25 MG PO CAPS
25.0000 mg | ORAL_CAPSULE | Freq: Two times a day (BID) | ORAL | Status: DC
  Administered 2020-10-13 – 2020-10-17 (×8): 25 mg via ORAL
  Filled 2020-10-13 (×11): qty 1

## 2020-10-13 MED ORDER — INSULIN ASPART 100 UNIT/ML IJ SOLN
0.0000 [IU] | Freq: Four times a day (QID) | INTRAMUSCULAR | Status: DC
  Administered 2020-10-13: 3 [IU] via SUBCUTANEOUS

## 2020-10-13 MED ORDER — SULFAMETHOXAZOLE-TRIMETHOPRIM 400-80 MG PO TABS
1.0000 | ORAL_TABLET | ORAL | Status: DC
  Administered 2020-10-14: 1 via ORAL
  Filled 2020-10-13: qty 1

## 2020-10-13 MED ORDER — PANTOPRAZOLE SODIUM 40 MG IV SOLR
40.0000 mg | Freq: Every day | INTRAVENOUS | Status: DC
  Administered 2020-10-13 – 2020-10-18 (×5): 40 mg via INTRAVENOUS
  Filled 2020-10-13 (×6): qty 40

## 2020-10-13 MED ORDER — ORAL CARE MOUTH RINSE
15.0000 mL | Freq: Two times a day (BID) | OROMUCOSAL | Status: DC
  Administered 2020-10-16 – 2020-10-18 (×4): 15 mL via OROMUCOSAL

## 2020-10-13 MED ORDER — DOCUSATE SODIUM 100 MG PO CAPS: 100.0000 mg | ORAL_CAPSULE | Freq: Two times a day (BID) | ORAL | Status: DC | PRN

## 2020-10-13 MED ORDER — INSULIN ASPART 100 UNIT/ML IJ SOLN
0.0000 [IU] | Freq: Three times a day (TID) | INTRAMUSCULAR | Status: DC
Start: 2020-10-14 — End: 2020-10-14
  Administered 2020-10-14 (×2): 2 [IU] via SUBCUTANEOUS

## 2020-10-13 MED ORDER — DORZOLAMIDE HCL-TIMOLOL MAL PF 22.3-6.8 MG/ML OP SOLN: 1.0000 [drp] | Freq: Two times a day (BID) | OPHTHALMIC | Status: DC

## 2020-10-13 MED ORDER — CYCLOSPORINE 100 MG PO CAPS
100.0000 mg | ORAL_CAPSULE | Freq: Two times a day (BID) | ORAL | Status: DC
  Administered 2020-10-13 (×2): 100 mg via ORAL
  Filled 2020-10-13 (×3): qty 1

## 2020-10-13 MED ORDER — CYCLOSPORINE MODIFIED (GENGRAF) 25 MG PO CAPS: 125.0000 mg | ORAL_CAPSULE | Freq: Two times a day (BID) | ORAL | Status: DC

## 2020-10-13 MED ORDER — VALGANCICLOVIR HCL 450 MG PO TABS
450.0000 mg | ORAL_TABLET | ORAL | Status: DC
  Administered 2020-10-14 – 2020-10-16 (×2): 450 mg via ORAL
  Filled 2020-10-13 (×2): qty 1

## 2020-10-13 MED ORDER — ONDANSETRON HCL 4 MG/2ML IJ SOLN: 4.0000 mg | Freq: Four times a day (QID) | INTRAMUSCULAR | Status: DC | PRN

## 2020-10-13 MED ORDER — CYCLOSPORINE MODIFIED (GENGRAF) 25 MG PO CAPS
100.0000 mg | ORAL_CAPSULE | Freq: Two times a day (BID) | ORAL | Status: DC
  Administered 2020-10-13 – 2020-10-17 (×8): 100 mg via ORAL
  Filled 2020-10-13 (×18): qty 1

## 2020-10-13 MED ORDER — HYDROCORTISONE NA SUCCINATE PF 100 MG IJ SOLR
50.0000 mg | Freq: Four times a day (QID) | INTRAMUSCULAR | Status: DC
  Administered 2020-10-13 – 2020-10-14 (×6): 50 mg via INTRAVENOUS
  Filled 2020-10-13 (×6): qty 2

## 2020-10-13 MED ORDER — NOREPINEPHRINE 4 MG/250ML-% IV SOLN
0.0000 ug/min | INTRAVENOUS | Status: DC
  Administered 2020-10-13: 1 ug/min via INTRAVENOUS
  Filled 2020-10-13: qty 250

## 2020-10-13 MED ORDER — CYCLOSPORINE 25 MG PO CAPS: 125.0000 mg | ORAL_CAPSULE | Freq: Two times a day (BID) | ORAL | Status: DC

## 2020-10-13 MED ORDER — INSULIN ASPART 100 UNIT/ML IJ SOLN
0.0000 [IU] | Freq: Every day | INTRAMUSCULAR | Status: DC
  Administered 2020-10-13 – 2020-10-14 (×2): 2 [IU] via SUBCUTANEOUS

## 2020-10-13 NOTE — ED Notes (Signed)
Pt hypotensive again map in low 60s, page out to ccm. Pt denies any s/s.

## 2020-10-13 NOTE — ED Notes (Addendum)
Dr Earlie Server at bedside

## 2020-10-13 NOTE — H&P (Signed)
NAME:  [name], MRN:  [id number], DOB:  06/29/1950, LOS: 0 ADMISSION DATE:  10/12/2020,   History of Present Illness:  This is a 70 year old black male that presented from home to the emergency room.  Patient complains of severe left arm pain proximal to the area of his fistula.  Had his hemodialysis on Monday Wednesday Friday and intends to do this today.  After dialysis he noted pain is was getting worse.  On arrival to the emergency room he was found to be hypotensive.  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  Patient denies any chest pain/chest pressure/shortness of breath/chills/sore throat/headache/dizziness.  The patient does describe fevers.  He denies any nausea or vomiting.  No pain in the lower extremities.  Pertinent  Medical History  BOLT Chronic anemia Obstructive sleep apnea GERD Essential hypertension Interstitial lung disease End-stage renal disease on hemodialysis Hepatic cirrhosis Lung mass Diabetes mellitus  Significant Hospital Events: Including procedures, antibiotic start and stop dates in addition to other pertinent events   .     Objective   Blood pressure (!) 88/51, pulse 82, temperature 99.8 F (37.7 C), temperature source Oral, resp. rate 16, height 5' 3.5" (1.613 m), weight 76.2 kg, SpO2 98 %.        Intake/Output Summary (Last 24 hours) at 10/13/2020 0109 Last data filed at 10/12/2020 2303 Gross per 24 hour  Intake 1041.94 ml  Output --  Net 1041.94 ml   Filed Weights   10/12/20 2003  Weight: 76.2 kg    Examination: General: No acute distress HENT: Atraumatic/normocephalic positive icteric sclera, mucous membranes are moist Lungs: Clear to auscultation bilaterally.  No wheezing rales or rhonchi noted. Cardiovascular: Regular rate.  5/6 systolic ejection murmur at the second left intercostal space.  4/6 apical systolic ejection murmur abdomen: Soft, nontender, nondistended, positive bowel sounds, no  rebound/rigidity/guarding. Extremities: Distal pulse intact x4.  Lower extremities: No contusions or abrasions or areas of erythema.  No significant edema.  Upper extremities: Left upper extremity has fistula in place.  There is positive bruit positive thrill.  It is tender to touch.  Just inferomedial to the superior edge is specifically tender.  No fluctuance noted.  Distal pulse intact.  No edema or cyanosis noted. Neuro: Patient is conscious alert and oriented x3.  He does have intermittent word finding issues where he knows that he is not answering all the questions but does not always remember how to.  Left hand is weaker than the right hand.  His residual lift the arm to the office bed.  Right side motors intact.  Lower extremities motors intact.   Labs/imaging that I havepersonally reviewed  (right click and "Reselect all SmartList Selections" daily)      Assessment & Plan:  Septic shock End-stage renal disease on hemodialysis Chronic anemia BOLT on immunosuppressants  obstructive sleep apnea Obstructive sleep apnea  Plan: Admit to the intensive care unit for further work-up. Patient received IV fluid bolus while in the emergency room. Will augment blood pressure with Levophed if it starts to drift back down. Stress dose steroids hydrocortisone 10/2 50 mg IV every 6 hours.  Patient is generally on 5 mg prednisone orally once a day. Continue patient is on antirejection medication therapy. Broad-spectrum antibiotics been added to vancomycin and Zosyn. Consult nephrology for history hemodialysis CT head in ER was negative.  We will continue to monitor neuro status.   Best practice (right click and "Reselect  all SmartList Selections" daily)  Diet:  NPO Pain/Anxiety/Delirium protocol (if indicated): No VAP protocol (if indicated): Not indicated DVT prophylaxis: Subcutaneous Heparin GI prophylaxis: PPI Glucose control: Monitor blood sugars Central venous access:  N/A Arterial  line:  N/A Foley:  N/A Mobility:  bed rest  PT consulted: N/A Last date of multidisciplinary goals of care discussion  Code Status:  full code Disposition: Admit to the intensive care unit.  Labs   CBC: Recent Labs  Lab 10/12/20 2038 10/12/20 2103 10/12/20 2119  WBC 14.8*  --   --   NEUTROABS 11.0*  --   --   HGB 9.7* 9.5* 9.2*  HCT 31.8* 28.0* 27.0*  MCV 105.3*  --   --   PLT 229  --   --     Basic Metabolic Panel: Recent Labs  Lab 10/12/20 2038 10/12/20 2103 10/12/20 2119  NA 140 141 140  K 3.5 3.3* 3.3*  CL 96*  --  97*  CO2 28  --   --   GLUCOSE 114*  --  112*  BUN 14  --  14  CREATININE 3.13*  --  2.90*  CALCIUM 7.8*  --   --    GFR: Estimated Creatinine Clearance: 21.6 mL/min (A) (by C-G formula based on SCr of 2.9 mg/dL (H)). Recent Labs  Lab 10/12/20 2038 10/12/20 2340  WBC 14.8*  --   LATICACIDVEN 3.5* 1.5    Liver Function Tests: Recent Labs  Lab 10/12/20 2038  AST 40  ALT 26  ALKPHOS 643*  BILITOT 3.3*  PROT 6.1*  ALBUMIN 1.9*   No results for input(s): LIPASE, AMYLASE in the last 168 hours. No results for input(s): AMMONIA in the last 168 hours.  ABG    Component Value Date/Time   PHART 7.381 01/01/2016 1843   PCO2ART 42.4 01/01/2016 1843   PO2ART 78.0 (L) 01/01/2016 1843   HCO3 33.4 (H) 10/12/2020 2103   TCO2 31 10/12/2020 2119   ACIDBASEDEF 2.0 12/30/2015 0841   O2SAT 96.0 10/12/2020 2103     Coagulation Profile: No results for input(s): INR, PROTIME in the last 168 hours.  Cardiac Enzymes: No results for input(s): CKTOTAL, CKMB, CKMBINDEX, TROPONINI in the last 168 hours.  HbA1C: Hgb A1c MFr Bld  Date/Time Value Ref Range Status  07/18/2020 05:53 AM 5.4 4.8 - 5.6 % Final    Comment:    (NOTE) Pre diabetes:          5.7%-6.4%  Diabetes:              >6.4%  Glycemic control for   <7.0% adults with diabetes   01/08/2020 10:59 AM 5.8 (H) 4.8 - 5.6 % Final    Comment:    (NOTE) Pre diabetes:           5.7%-6.4%  Diabetes:              >6.4%  Glycemic control for   <7.0% adults with diabetes     CBG: No results for input(s): GLUCAP in the last 168 hours.  Review of Systems:   See HPI.  Limited due to patient's word finding.  Past Medical History:  He,  has a past medical history of Aortic valve disorders, Benign neoplasm of colon, Degeneration of intervertebral disc, site unspecified, Diabetes mellitus without complication ([place]), Diaphragmatic hernia without mention of obstruction or gangrene, Esophageal reflux, ESRD (end stage renal disease) on dialysis [facility]), Essential hypertension, Obstructive sleep apnea (adult) (pediatric), Osteoarthrosis, unspecified whether generalized or localized,  unspecified site, Other and unspecified hyperlipidemia, Pneumonia, Pulmonary fibrosis ([place]), Renal disorder, Respiratory failure with hypoxia ([place]) (12/2015), Transplanted, lung ([place]), and Unspecified essential hypertension.   Surgical History:   Past Surgical History:  Procedure Laterality Date  . COLONOSCOPY WITH PROPOFOL N/A 11/07/2017   Procedure: COLONOSCOPY WITH PROPOFOL;  Surgeon: Ronnette Juniper, MD;  Location: [facility];  Service: Gastroenterology;  Laterality: N/A;  . LIGATION OF ARTERIOVENOUS  FISTULA Left 01/14/2019   Procedure: LIGATION OF ARTERIOVENOUS  FISTULA;  Surgeon: Marty Heck, MD;  Location: [facility];  Service: Vascular;  Laterality: Left;  . LUNG BIOPSY  2010  . LUNG TRANSPLANT, SINGLE Right   . POLYPECTOMY  11/07/2017   Procedure: POLYPECTOMY;  Surgeon: Ronnette Juniper, MD;  Location: [facility];  Service: Gastroenterology;;     Social History:   reports that he quit smoking about 18 years ago. His smoking use included cigarettes. He has a 6.00 pack-year smoking history. He has never used smokeless tobacco. He reports that he does not drink alcohol and does not use drugs.   Family History:  His family history includes Heart disease in his father; Pancreatic cancer in his  brother.   Allergies Allergies  Allergen Reactions  . Cefepime Other (See Comments)    Altered mental status  . Levofloxacin Other (See Comments)    LOSS OF CONSCIOUSNESS  . Nsaids Other (See Comments)    Patient instructed not to take NSAID's after his lung transplant  . Tolmetin      Home Medications  Prior to Admission medications   Medication Sig Start Date End Date Taking? Authorizing Provider  cycloSPORINE (SANDIMMUNE) 25 MG capsule Take 125 mg by mouth 2 (two) times daily.   Yes [provider]  ACCU-CHEK FASTCLIX LANCETS MISC "As directed up to 4 times daily" 12/16/15   [provider]  acetaminophen (TYLENOL) 160 MG/5ML suspension Place 649.6 mg into feeding tube every 6 (six) hours as needed. 09/15/20   [provider]  albuterol (VENTOLIN HFA) 108 (90 Base) MCG/ACT inhaler Inhale 1-2 puffs into the lungs daily as needed for wheezing or shortness of breath. 05/12/20   [provider]  aspirin 81 MG tablet Take 81 mg by mouth daily with breakfast.    [provider]  cycloSPORINE (GENGRAF) 100 MG/ML microemulsion solution Take 100 mg by mouth every 12 (twelve) hours.    [provider]  dorzolamidel-timolol (COSOPT PF) 22.3-6.8 MG/ML SOLN ophthalmic solution Place 1 drop into both eyes 2 (two) times daily.    [provider]  fluticasone (FLONASE) 50 MCG/ACT nasal spray Place 2 sprays into the nose daily. Patient taking differently: Place 2 sprays into the nose daily as needed for allergies. 03/05/13   Wenda Low, MD  insulin regular (NOVOLIN R) 100 units/mL injection Inject into the skin See admin instructions. Inject per sliding scale: if 0-199=0; 200-250=1; 251-300=2; 301-350=3; 351-400=4 and call MD, subcutaneously four times a day. Give in addition to schedule amount.    [provider]  lidocaine (LIDODERM) 5 % Place 1 patch onto the skin daily. Place 1 patch onto the skin daily for 30 days Apply patch  to the most painful area for up to 12 hours in a 24 hour period. 09/16/20 10/16/20  [provider]  Loperamide HCl 1 MG/7.5ML LIQD Take 15 mLs (2 mg total) by G tube 3 (three) times daily as needed for Diarrhea 09/15/20   [provider]  midodrine (PROAMATINE) 10 MG tablet Place 10 mg into  feeding tube daily as needed (for hypotension prior to PT/OT PRN for BP).    [provider]  midodrine (PROAMATINE) 10 MG tablet Place 10 mg into feeding tube 2 (two) times daily. Give before dialysis    [provider]  Multiple Vitamin (MULTIVITAMIN WITH MINERALS) TABS tablet Place 1 tablet into feeding tube daily. Centrum for Men 50 plus: on Monday, Wednesday, Friday (dialysis days): take one tablet daily with breakfast; on Sunday, Tuesday, Thursday, Saturday: take one tablet daily with supper    [provider]  Nutritional Supplements (FEEDING SUPPLEMENT, NEPRO CARB STEADY,) LIQD Place 1,000 mLs into feeding tube daily. 09/28/20   Fritzi Mandes, MD  omeprazole (FIRST-OMEPRAZOLE) 2 mg/mL SUSP oral suspension Place 40 mg into feeding tube daily.    [provider]  ondansetron (ZOFRAN) 4 MG tablet Place 4 mg into feeding tube every 6 (six) hours as needed for nausea or vomiting.    [provider]  oxyCODONE (ROXICODONE) 5 MG immediate release tablet Take 1 tablet (5 mg total) by mouth every 4 (four) hours as needed for severe pain. 10/11/20   Tacy Learn, PA-C  pravastatin (PRAVACHOL) 10 MG tablet Place 10 mg into feeding tube at bedtime. 03/16/20   [provider]  predniSONE (DELTASONE) 5 MG tablet Take 1 tablet (5 mg total) by G tube once daily . Crush and administer via GTube daily. 09/16/20 09/16/21  [provider]  sulfamethoxazole-trimethoprim (BACTRIM,SEPTRA) 400-80 MG tablet Place 1 tablet into feeding tube See admin instructions. On Monday, Wednesday, Friday (dialysis days): take one tablet by mouth daily with supper 02/27/17    [provider]  valganciclovir (VALCYTE) 50 MG/ML SOLR Take 450 mg by mouth See admin instructions. Take one tablet (450 mg) by mouth on Monday, Wednesday, and Friday after dialysis 03/26/15   [provider]  vitamin B-6 (PYRIDOXINE) 25 MG tablet Place 25 mg into feeding tube daily with breakfast.    [provider]     Critical care time: 55 minutes

## 2020-10-13 NOTE — Progress Notes (Signed)
eLink Physician-Brief Progress Note Patient Name: [name] DOB: 09/15/1950 MRN: [id number]   Date of Service  10/13/2020  HPI/Events of Note  69/M with ESRD on HD complaining of pain on left arm pain, proximal to his fistula site.  He was noted to be hypotensive in the ED despite IVFS, started on levophed.  Pt started on broad spectrum antibiotics, stress dose steroids.   Pt is awake and alert, not in distress. BP 130/63, HR 93, RR 20s, O2 sats 99%.  eICU Interventions  Continue empiric antibiotics.  Stress dose steroids. Titrate levophed to keep MAP >65. Heparin for DVT prophylaxis.      Intervention Category Evaluation Type: New Patient Evaluation  Elsie Lincoln 10/13/2020, 1:58 AM

## 2020-10-13 NOTE — Consult Note (Addendum)
[facility] Renal Consultation Note    Indication for Consultation:  Management of ESRD/hemodialysis; anemia, hypertension/volume and secondary hyperparathyroidism  HPI: [name] is a 70 y.o. male with ESRD on HD with ESRD on HD MWF, pulmonary fibrosis s/p lung transplant on IS, DM, HTN, h/o GI bleeding. He is admitted for sepsis work-up. He presented to [facility] ED yesterday after dialysis with lethargy/weakness. Found to be hypotensive on admission. CXR, Head CT negative. Received IVF bolus, empiric antibiotics in the ED. Blood cultures collected and was admitted to ICU on Levophed.   Recent long [facility] admit 2/17-4/13/22 in with septic shock/PNA and new LUL mass - Stage 2B NSCLC - s/p XRT, not cand for chemo. He was discharged to a SNF in [place] and had been doing his dialysis in [place] until Monday 5/9 when he returned to his usual center [facility] Net UF was 0.1L on 5/9.  Dr. Posey Pronto noted on 5/6 visit -left arm swelling and difficult cannulation and he was referred to [facility] for fistulogram.   Seen and examined at bedside. He has been weaned of Levophed. BP stable. Appears comfortable, getting ready to eat lunch. Per nursing has been on 1L  this am. He states that he is not on O2 at home.  He denies any L arm pain currently. Did not have any arm pain at dialysis Monday. Biggest complaint is weakness.   Past Medical History:  Diagnosis Date  . Aortic valve disorders   . Benign neoplasm of colon   . Degeneration of intervertebral disc, site unspecified   . Diabetes mellitus without complication ([place])   . 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 Esophageal reflux   . ESRD (end stage renal disease) on dialysis ([place])   . Essential hypertension   . Obstructive sleep apnea (adult) (pediatric)   . Osteoarthrosis, unspecified whether generalized or localized, unspecified site   . Other and unspecified hyperlipidemia   . Pneumonia    interstitial  pneumonia  . Pulmonary fibrosis ([place])   . Renal disorder   . Respiratory failure with hypoxia ([place]) 12/2015  . Transplanted, lung ([place])   . Unspecified essential hypertension    Past Surgical History:  Procedure Laterality Date  . COLONOSCOPY WITH PROPOFOL N/A 11/07/2017   Procedure: COLONOSCOPY WITH PROPOFOL;  Surgeon: Ronnette Juniper, MD;  Location: [facility];  Service: Gastroenterology;  Laterality: N/A;  . LIGATION OF ARTERIOVENOUS  FISTULA Left 01/14/2019   Procedure: LIGATION OF ARTERIOVENOUS  FISTULA;  Surgeon: Marty Heck, MD;  Location: [facility];  Service: Vascular;  Laterality: Left;  . LUNG BIOPSY  2010  . LUNG TRANSPLANT, SINGLE Right   . POLYPECTOMY  11/07/2017   Procedure: POLYPECTOMY;  Surgeon: Ronnette Juniper, MD;  Location: [facility];  Service: Gastroenterology;;   Family History  Problem Relation Age of Onset  . Pancreatic cancer Brother   . Heart disease Father   no family history of ESRD  Social History:  reports that he quit smoking about 18 years ago. His smoking use included cigarettes. He has a 6.00 pack-year smoking history. He has never used smokeless tobacco. He reports that he does not drink alcohol and does not use drugs. Allergies  Allergen Reactions  . Cefepime Other (See Comments)    Altered mental status  . Levofloxacin Other (See Comments)    LOSS OF CONSCIOUSNESS  . Nsaids Other (See Comments)    Patient instructed not to take NSAID's after his lung transplant  . Tolmetin  Prior to Admission medications   Medication Sig Start Date End Date Taking? Authorizing Provider  cycloSPORINE (SANDIMMUNE) 25 MG capsule Take 125 mg by mouth 2 (two) times daily.   Yes [provider]  ACCU-CHEK FASTCLIX LANCETS MISC "As directed up to 4 times daily" 12/16/15   [provider]  acetaminophen (TYLENOL) 160 MG/5ML suspension Place 649.6 mg into feeding tube every 6 (six) hours as needed. 09/15/20   [provider]  albuterol  (VENTOLIN HFA) 108 (90 Base) MCG/ACT inhaler Inhale 1-2 puffs into the lungs daily as needed for wheezing or shortness of breath. 05/12/20   [provider]  aspirin 81 MG tablet Take 81 mg by mouth daily with breakfast.    [provider]  cycloSPORINE (GENGRAF) 100 MG/ML microemulsion solution Take 100 mg by mouth every 12 (twelve) hours.    [provider]  dorzolamidel-timolol (COSOPT PF) 22.3-6.8 MG/ML SOLN ophthalmic solution Place 1 drop into both eyes 2 (two) times daily.    [provider]  fluticasone (FLONASE) 50 MCG/ACT nasal spray Place 2 sprays into the nose daily. Patient taking differently: Place 2 sprays into the nose daily as needed for allergies. 03/05/13   Wenda Low, MD  insulin regular (NOVOLIN R) 100 units/mL injection Inject into the skin See admin instructions. Inject per sliding scale: if 0-199=0; 200-250=1; 251-300=2; 301-350=3; 351-400=4 and call MD, subcutaneously four times a day. Give in addition to schedule amount.    [provider]  lidocaine (LIDODERM) 5 % Place 1 patch onto the skin daily. Place 1 patch onto the skin daily for 30 days Apply patch to the most painful area for up to 12 hours in a 24 hour period. 09/16/20 10/16/20  [provider]  Loperamide HCl 1 MG/7.5ML LIQD Take 15 mLs (2 mg total) by G tube 3 (three) times daily as needed for Diarrhea 09/15/20   [provider]  midodrine (PROAMATINE) 10 MG tablet Place 10 mg into feeding tube daily as needed (for hypotension prior to PT/OT PRN for BP).    [provider]  midodrine (PROAMATINE) 10 MG tablet Place 10 mg into feeding tube 2 (two) times daily. Give before dialysis    [provider]  Multiple Vitamin (MULTIVITAMIN WITH MINERALS) TABS tablet Place 1 tablet into feeding tube daily. Centrum for Men 50 plus: on Monday, Wednesday, Friday (dialysis days): take one tablet daily with breakfast; on Sunday, Tuesday, Thursday,  Saturday: take one tablet daily with supper    [provider]  Nutritional Supplements (FEEDING SUPPLEMENT, NEPRO CARB STEADY,) LIQD Place 1,000 mLs into feeding tube daily. 09/28/20   Fritzi Mandes, MD  omeprazole (FIRST-OMEPRAZOLE) 2 mg/mL SUSP oral suspension Place 40 mg into feeding tube daily.    [provider]  ondansetron (ZOFRAN) 4 MG tablet Place 4 mg into feeding tube every 6 (six) hours as needed for nausea or vomiting.    [provider]  oxyCODONE (ROXICODONE) 5 MG immediate release tablet Take 1 tablet (5 mg total) by mouth every 4 (four) hours as needed for severe pain. 10/11/20   Tacy Learn, PA-C  pravastatin (PRAVACHOL) 10 MG tablet Place 10 mg into feeding tube at bedtime. 03/16/20   [provider]  predniSONE (DELTASONE) 5 MG tablet Take 1 tablet (5 mg total) by G tube once daily . Crush and administer via GTube daily. 09/16/20 09/16/21  [provider]  sulfamethoxazole-trimethoprim (BACTRIM,SEPTRA) 400-80 MG tablet Place 1 tablet into feeding tube See admin instructions.  On Monday, Wednesday, Friday (dialysis days): take one tablet by mouth daily with supper 02/27/17   [provider]  valganciclovir (VALCYTE) 50 MG/ML SOLR Take 450 mg by mouth See admin instructions. Take one tablet (450 mg) by mouth on Monday, Wednesday, and Friday after dialysis 03/26/15   [provider]  vitamin B-6 (PYRIDOXINE) 25 MG tablet Place 25 mg into feeding tube daily with breakfast.    [provider]   Current Facility-Administered Medications  Medication Dose Route Frequency Provider Last Rate Last Admin  . Chlorhexidine Gluconate Cloth 2 % PADS 6 each  6 each Topical Daily Deland Pretty, MD   6 each at 10/13/20 [phone]  . cycloSPORINE (SANDIMMUNE) capsule 100 mg  100 mg Oral BID Deland Pretty, MD   100 mg at 10/13/20 5400  . docusate sodium (COLACE) capsule 100 mg  100 mg Oral BID PRN Deland Pretty, MD       . heparin injection 5,000 Units  5,000 Units Subcutaneous Q8H Deland Pretty, MD   5,000 Units at 10/13/20 1324  . hydrocortisone sodium succinate (SOLU-CORTEF) 100 MG injection 50 mg  50 mg Intravenous Q6H Deland Pretty, MD   50 mg at 10/13/20 1321  . insulin aspart (novoLOG) injection 0-20 Units  0-20 Units Subcutaneous Q6H Margaretha Seeds, MD   3 Units at 10/13/20 1316  . midodrine (PROAMATINE) tablet 10 mg  10 mg Oral q12n4p Deland Pretty, MD   10 mg at 10/13/20 1320  . norepinephrine (LEVOPHED) 4mg  in 284mL premix infusion  0-40 mcg/min Intravenous Continuous Deland Pretty, MD   Stopped at 10/13/20 [phone]  . ondansetron (ZOFRAN) injection 4 mg  4 mg Intravenous Q6H PRN Deland Pretty, MD      . pantoprazole (PROTONIX) injection 40 mg  40 mg Intravenous Daily Deland Pretty, MD   40 mg at 10/13/20 0905  . piperacillin-tazobactam (ZOSYN) 2.25 g in sodium chloride 0.9 % 50 mL IVPB  2.25 g Intravenous Q8H Mancheril, Darnell Level, RPH 100 mL/hr at 10/13/20 1309 2.25 g at 10/13/20 1309  . polyethylene glycol (MIRALAX / GLYCOLAX) packet 17 g  17 g Oral Daily PRN Deland Pretty, MD      . Derrill Memo ON 10/14/2020] sulfamethoxazole-trimethoprim (BACTRIM) 400-80 MG per tablet 1 tablet  1 tablet Oral Once per day on Mon Wed Fri Deland Pretty, MD      . Derrill Memo ON 10/14/2020] valGANciclovir (VALCYTE) 450 MG tablet TABS 450 mg  450 mg Oral Once per day on Mon Wed Fri Deland Pretty, MD         ROS: As per HPI otherwise negative.  Physical Exam: Vitals:   10/13/20 1113 10/13/20 1130 10/13/20 1200 10/13/20 1315  BP:  113/67  134/69  Pulse:  80 76 85  Resp:  14 (!) 0 16  Temp: 97.8 F (36.6 C)     TempSrc: Oral     SpO2:  100% 100% 94%  Weight:      Height:         General: Chronically ill appearing, nad   Head: NCAT sclera not icteric MMM Neck: Supple. No JVD appreciated  Lungs: Diminished air movement; no wheeze, rales   Heart: RRR with S1 S2 Abdomen: soft non-tender  Lower extremities:without edema or ischemic changes, no open wounds  Neuro: A & O  X 3. Moves all extremities spontaneously. Psych:  Responds to questions appropriately with a normal affect. Dialysis Access: LUE AVF +  bruit; some generalized edema   Labs: Basic Metabolic Panel: Recent Labs  Lab 10/12/20 2038 10/12/20 2103 10/12/20 2119 10/13/20 0106 10/13/20 0208  NA 140 141 140  --  142  K 3.5 3.3* 3.3*  --  3.2*  CL 96*  --  97*  --  100  CO2 28  --   --   --  28  GLUCOSE 114*  --  112*  --  91  BUN 14  --  14  --  12  CREATININE 3.13*  --  2.90* 3.27* 3.29*  CALCIUM 7.8*  --   --   --  7.8*  PHOS  --   --   --   --  4.1   Liver Function Tests: Recent Labs  Lab 10/12/20 2038  AST 40  ALT 26  ALKPHOS 643*  BILITOT 3.3*  PROT 6.1*  ALBUMIN 1.9*   No results for input(s): LIPASE, AMYLASE in the last 168 hours. No results for input(s): AMMONIA in the last 168 hours. CBC: Recent Labs  Lab 10/12/20 2038 10/12/20 2103 10/12/20 2119 10/13/20 0106 10/13/20 0208  WBC 14.8*  --   --  17.0* 18.3*  NEUTROABS 11.0*  --   --   --   --   HGB 9.7*   < > 9.2* 7.8* 9.2*  HCT 31.8*   < > 27.0* 25.8* 30.9*  MCV 105.3*  --   --  106.2* 107.3*  PLT 229  --   --  201 206   < > = values in this interval not displayed.   Cardiac Enzymes: No results for input(s): CKTOTAL, CKMB, CKMBINDEX, TROPONINI in the last 168 hours. CBG: Recent Labs  Lab 10/13/20 0151 10/13/20 1315  GLUCAP 89 146*   Iron Studies: No results for input(s): IRON, TIBC, TRANSFERRIN, FERRITIN in the last 72 hours. Studies/Results: CT Head Wo Contrast  Result Date: 10/12/2020 CLINICAL DATA:  Weakness elevated blood pressure EXAM: CT HEAD WITHOUT CONTRAST TECHNIQUE: Contiguous axial images were obtained from the base of the skull through the vertex without intravenous contrast. COMPARISON:  CT brain 04/24/2020, 02/05/2019 FINDINGS: Brain: No acute territorial  infarction, hemorrhage or intracranial mass. Chronic lacunar infarct or prominent perivascular space in the right subinsular region without change. Stable ventricle size. Vascular: No hyperdense vessels.  Carotid vascular calcification Skull: Normal. Negative for fracture or focal lesion. Sinuses/Orbits: Patchy mucosal thickening in the sinuses Other: None IMPRESSION: No CT evidence for acute intracranial abnormality. Electronically Signed   By: Donavan Foil M.D.   On: 10/12/2020 21:51   DG Chest Portable 1 View  Result Date: 10/12/2020 CLINICAL DATA:  Hypotension following dialysis EXAM: PORTABLE CHEST 1 VIEW COMPARISON:  09/27/2020 FINDINGS: Cardiac shadow is stable. Aortic calcifications are noted. Vascular stents are noted in the region of the left shoulder and left innominate vein stable from the prior exam. Previously seen tunneled PICC line on the right has been removed. Postsurgical changes are noted. Chronic fibrotic changes in the left lung are again noted. No acute infiltrate is seen. IMPRESSION: Chronic changes without acute abnormality. Electronically Signed   By: Inez Catalina M.D.   On: 10/12/2020 21:03    Dialysis Orders:  [facility] MWF 4h 400/800  EDW 58kg 2K/2Ca   Assessment/Plan: 1. Sepsis w/u - Hypotensive on admission req pressor support with leukocytosis. Blood cultures ngtd. Empiric antibiotics started --per primary  2. ESRD -  HD MWF. No urgent indication today. Next HD 5/11. Added K+ bath  3. Hypertension/volume  -  BP improved. Off pressors. BP has been stable. Has not been reaching dry weight as outpatient. Gentle UF. Follow weights here.  4. Anemia  - Hgb 7.8. ESA held at dialysis center d/t new dx lung ca. Transfuse prn. Hx GI bleed. Hold heparin  5. Metabolic bone disease -  Corr Ca/Phos ok. Follow trends.  6. Nutrition - Renal/carb modified diet. Prot supp. Still has PEG tube from prior admission. Not using 7. S/p lung transplant --on IS -per primary 8. LUL mass/Stage 2B  NSCLC  Lynnda Child PA-C [facility] 10/13/2020, 1:52 PM      Seen and examined independently.  Agree with note and exam as documented above by physician extender and as noted here.  Patient with End-stage renal disease on hemodialysis Monday Wednesday Friday at [facility] with pulmonary fibrosis status post lung transplant in 2016 and a recent diagnosis of lung cancer.  He presented to the ER after dialysis yesterday because he was weak and confused "not coherent" and "lethargic."  His wife tried to get him to eat but he was so tired he "couldn't hold my head up."  He was found to be hypotensive. he does note that he is on midodrine at home and states this is a chronic medication.  He's not sure if any new meds - his wife manages.  Noted he was recently at [facility] for couple of months with a new diagnosis of lung cancer.  He states he was discharged from rehab Friday and had recently transitioned back to [facility].  On discharge from rehab he states was started on oxygen.  With regard to other recent nephrology history he was noted to have left arm swelling and difficult cannulation was referred to [facility] for fistulogram.  He states "I feel fine now".  Flu and covid neg  General adult male in bed in no acute distress HEENT normocephalic atraumatic extraocular movements intact sclera anicteric Neck supple trachea midline Lungs clear but reduced on auscultation bilaterally normal work of breathing at rest; on room air Heart regular rate and rhythm no rubs or gallops appreciated Abdomen soft nontender nondistended Extremities no lower extremity edema  Psych normal mood and affect Neuro alert and oriented x 3; provides hx and follows commands Access: LUE AVF bruit   Sepsis - w/u per primary team.  Blood cx NGTD. Flu and covid neg  ESRD HD per Monday Wednesday Friday schedule   Chronic hypotension on midodrine  Anemia CKD no ESA in the setting of new lung cancer.  transfusions as  needed no heparin with hemo.  Metabolic bone disease - phos ok.  Lung transplant  - in 2016.  Immunosuppression per primary team  NSCLC - noted; no ESA as above  Claudia Desanctis, MD 10/13/2020 5:16 PM

## 2020-10-13 NOTE — Progress Notes (Addendum)
NAME:  [name], MRN:  [id number], DOB:  [birth year], LOS: 0 ADMISSION DATE:  10/12/2020,   History of Present Illness:  This is a 70 year old black male that presented from home to the emergency room.  Patient complains of severe left arm pain proximal to the area of his fistula.  Had his hemodialysis on Monday Wednesday Friday and intends to do this today.  After dialysis he noted pain is was getting worse.  On arrival to the emergency room he was found to be hypotensive. 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 Patient denies any chest pain/chest pressure/shortness of breath/chills/sore throat/headache/dizziness. The patient does describe fevers. He denies any nausea or vomiting. No pain in the lower extremities.  Pertinent  Medical History  BOLT Chronic anemia Obstructive sleep apnea GERD Essential hypertension Interstitial lung disease End-stage renal disease on hemodialysis Hepatic cirrhosis Lung mass Diabetes mellitus  Significant Hospital Events: Including procedures, antibiotic start and stop dates in addition to other pertinent events   . 10/13/20- Levophed started and tapered off  Antibiotics:  Zosyn 5/10 >> Vancomycin 5/10>>5/10   Objective   Blood pressure 120/73, pulse 81, temperature 98.1 F (36.7 C), temperature source Oral, resp. rate 15, height 5' 3.5" (1.613 m), weight 61.2 kg, SpO2 100 %.        Intake/Output Summary (Last 24 hours) at 10/13/2020 0956 Last data filed at 10/13/2020 5176 Gross per 24 hour  Intake 1560.19 ml  Output 1 ml  Net 1559.19 ml   Filed Weights   10/12/20 2003 10/13/20 0158  Weight: 76.2 kg 61.2 kg    Examination: General: No acute distress HENT: Atraumatic/normocephalic positive icteric sclera, MMM Lungs: Clear to auscultation bilaterally.  No wheezing rales or rhonchi noted. Cardiovascular: RRR. No M/R/G appreciated. Extremities: Distal pulse intact x4.  Lower extremities: No contusions  or abrasions or areas of erythema. No significant edema. Upper extremities: Left upper extremity has fistula in place. There is positive bruit and positive thrill. Nontender to palpation. No fluctuance noted. Distal pulse intact. No edema or cyanosis noted. Neuro: A+Ox4. CN II-XII intact. Left hand is weaker than the right hand. Motor intact in all extremities. Sensation intact throughout.  Labs/imaging that I havepersonally reviewed  (right click and "Reselect all SmartList Selections" daily)  WBC- 18.3 Hgb- 9.12 Na- 142 K- 3.2 GFR- 19 Mg- 1.9 Phos- 4.1 BCx- NGTD  NAEON. Afebrile. Levo titrated to 3 mcg/min and then tapered to 0.  Assessment & Plan:  Septic shock versus Hypovolemic Shock s/p HD End-stage renal disease on hemodialysis (MWF) Chronic anemia BOLT on immunosuppressants Obstructive sleep apnea  Neuro: CT head negative and patient A&Ox4. - Continue to monitor neuro status  Resp:  - Continue cyclosporine, bactrim and valganciclovir. - Continue stress dose of hydrocortisone (50mg  q6) - Patient Dahlgren being weaned, denies Frost use at home. Previous hospitalizations has required Dooling but weaned off by discharges.  ID: BCx NGTD. Patient had pain at fistula site yesterday although today has no pain and w/o fluctuance, tenderness or warmth or erythema on exam. Question where source of infection is.  - Continue zosyn - DC vancomycin given negative MRSA PCR  Cardiac: Patient at this point tapered off levophed with BPs in 120-130s/60-70s. Stable off of pressor. - Continue home midodrine BID  Heme: - SCDs - DVT prophylaxis w/ heparin starting this afternoon  Metabolic: Patient with ESRD [HD M/W/F] - ED note on 5/8 reports that patient scheduled for new vascular access  5/10. Patient not sure about this. No procedure scheduled in patient's chart. Vascular contacted and patient is not scheduled for procedure, nephrology unaware of vascular access change. Site with bruit and thrill  and patient had full HD yesterday. - Nephrology consulted and following  Endocrine: - SSI and CBG  N/GI: - Continue protonix - Oral renal diet  Best practice (right click and "Reselect all SmartList Selections" daily)  Diet:  Oral Pain/Anxiety/Delirium protocol (if indicated): No VAP protocol (if indicated): Not indicated DVT prophylaxis: Subcutaneous Heparin GI prophylaxis: PPI Glucose control: Monitor blood sugars and SSI Central venous access:  N/A Arterial line:  N/A Foley:  N/A Mobility:  bed rest  PT consulted: N/A Last date of multidisciplinary goals of care discussion  Code Status:  full code Disposition: ICU  Labs   CBC: Recent Labs  Lab 10/12/20 2038 10/12/20 2103 10/12/20 2119 10/13/20 0106 10/13/20 0208  WBC 14.8*  --   --  17.0* 18.3*  NEUTROABS 11.0*  --   --   --   --   HGB 9.7* 9.5* 9.2* 7.8* 9.2*  HCT 31.8* 28.0* 27.0* 25.8* 30.9*  MCV 105.3*  --   --  106.2* 107.3*  PLT 229  --   --  201 161    Basic Metabolic Panel: Recent Labs  Lab 10/12/20 2038 10/12/20 2103 10/12/20 2119 10/13/20 0106 10/13/20 0208  NA 140 141 140  --  142  K 3.5 3.3* 3.3*  --  3.2*  CL 96*  --  97*  --  100  CO2 28  --   --   --  28  GLUCOSE 114*  --  112*  --  91  BUN 14  --  14  --  12  CREATININE 3.13*  --  2.90* 3.27* 3.29*  CALCIUM 7.8*  --   --   --  7.8*  MG  --   --   --   --  1.9  PHOS  --   --   --   --  4.1   GFR: Estimated Creatinine Clearance: 16.9 mL/min (A) (by C-G formula based on SCr of 3.29 mg/dL (H)). Recent Labs  Lab 10/12/20 2038 10/12/20 2340 10/13/20 0106 10/13/20 0208  WBC 14.8*  --  17.0* 18.3*  LATICACIDVEN 3.5* 1.5  --   --     Liver Function Tests: Recent Labs  Lab 10/12/20 2038  AST 40  ALT 26  ALKPHOS 643*  BILITOT 3.3*  PROT 6.1*  ALBUMIN 1.9*   No results for input(s): LIPASE, AMYLASE in the last 168 hours. No results for input(s): AMMONIA in the last 168 hours.  ABG    Component Value Date/Time   PHART  7.381 01/01/2016 1843   PCO2ART 42.4 01/01/2016 1843   PO2ART 78.0 (L) 01/01/2016 1843   HCO3 33.4 (H) 10/12/2020 2103   TCO2 31 10/12/2020 2119   ACIDBASEDEF 2.0 12/30/2015 0841   O2SAT 96.0 10/12/2020 2103     Coagulation Profile: No results for input(s): INR, PROTIME in the last 168 hours.  Cardiac Enzymes: No results for input(s): CKTOTAL, CKMB, CKMBINDEX, TROPONINI in the last 168 hours.  HbA1C: Hgb A1c MFr Bld  Date/Time Value Ref Range Status  07/18/2020 05:53 AM 5.4 4.8 - 5.6 % Final    Comment:    (NOTE) Pre diabetes:          5.7%-6.4%  Diabetes:              >6.4%  Glycemic  control for   <7.0% adults with diabetes   01/08/2020 10:59 AM 5.8 (H) 4.8 - 5.6 % Final    Comment:    (NOTE) Pre diabetes:          5.7%-6.4%  Diabetes:              >6.4%  Glycemic control for   <7.0% adults with diabetes     CBG: Recent Labs  Lab 10/13/20 0151  GLUCAP 89    Review of Systems:   See HPI.  Limited due to patient's word finding.  Past Medical History:  He,  has a past medical history of Aortic valve disorders, Benign neoplasm of colon, Degeneration of intervertebral disc, site unspecified, Diabetes mellitus without complication ([place]), Diaphragmatic hernia without mention of obstruction or gangrene, Esophageal reflux, ESRD (end stage renal disease) on dialysis [facility]), Essential hypertension, Obstructive sleep apnea (adult) (pediatric), Osteoarthrosis, unspecified whether generalized or localized, unspecified site, Other and unspecified hyperlipidemia, Pneumonia, Pulmonary fibrosis ([place]), Renal disorder, Respiratory failure with hypoxia ([place]) (12/2015), Transplanted, lung ([place]), and Unspecified essential hypertension.   Surgical History:   Past Surgical History:  Procedure Laterality Date  . COLONOSCOPY WITH PROPOFOL N/A 11/07/2017   Procedure: COLONOSCOPY WITH PROPOFOL;  Surgeon: Ronnette Juniper, MD;  Location: [facility];  Service: Gastroenterology;  Laterality:  N/A;  . LIGATION OF ARTERIOVENOUS  FISTULA Left 01/14/2019   Procedure: LIGATION OF ARTERIOVENOUS  FISTULA;  Surgeon: Marty Heck, MD;  Location: [facility];  Service: Vascular;  Laterality: Left;  . LUNG BIOPSY  2010  . LUNG TRANSPLANT, SINGLE Right   . POLYPECTOMY  11/07/2017   Procedure: POLYPECTOMY;  Surgeon: Ronnette Juniper, MD;  Location: [facility];  Service: Gastroenterology;;     Social History:   reports that he quit smoking about 18 years ago. His smoking use included cigarettes. He has a 6.00 pack-year smoking history. He has never used smokeless tobacco. He reports that he does not drink alcohol and does not use drugs.   Family History:  His family history includes Heart disease in his father; Pancreatic cancer in his brother.   Allergies Allergies  Allergen Reactions  . Cefepime Other (See Comments)    Altered mental status  . Levofloxacin Other (See Comments)    LOSS OF CONSCIOUSNESS  . Nsaids Other (See Comments)    Patient instructed not to take NSAID's after his lung transplant  . Tolmetin      Home Medications  Prior to Admission medications   Medication Sig Start Date End Date Taking? Authorizing Provider  cycloSPORINE (SANDIMMUNE) 25 MG capsule Take 125 mg by mouth 2 (two) times daily.   Yes [provider]  ACCU-CHEK FASTCLIX LANCETS MISC "As directed up to 4 times daily" 12/16/15   [provider]  acetaminophen (TYLENOL) 160 MG/5ML suspension Place 649.6 mg into feeding tube every 6 (six) hours as needed. 09/15/20   [provider]  albuterol (VENTOLIN HFA) 108 (90 Base) MCG/ACT inhaler Inhale 1-2 puffs into the lungs daily as needed for wheezing or shortness of breath. 05/12/20   [provider]  aspirin 81 MG tablet Take 81 mg by mouth daily with breakfast.    [provider]  cycloSPORINE (GENGRAF) 100 MG/ML microemulsion solution Take 100 mg by mouth every 12 (twelve) hours.    [provider]   dorzolamidel-timolol (COSOPT PF) 22.3-6.8 MG/ML SOLN ophthalmic solution Place 1 drop into both eyes 2 (two) times daily.    [provider]  fluticasone (FLONASE) 50  MCG/ACT nasal spray Place 2 sprays into the nose daily. Patient taking differently: Place 2 sprays into the nose daily as needed for allergies. 03/05/13   Wenda Low, MD  insulin regular (NOVOLIN R) 100 units/mL injection Inject into the skin See admin instructions. Inject per sliding scale: if 0-199=0; 200-250=1; 251-300=2; 301-350=3; 351-400=4 and call MD, subcutaneously four times a day. Give in addition to schedule amount.    [provider]  lidocaine (LIDODERM) 5 % Place 1 patch onto the skin daily. Place 1 patch onto the skin daily for 30 days Apply patch to the most painful area for up to 12 hours in a 24 hour period. 09/16/20 10/16/20  [provider]  Loperamide HCl 1 MG/7.5ML LIQD Take 15 mLs (2 mg total) by G tube 3 (three) times daily as needed for Diarrhea 09/15/20   [provider]  midodrine (PROAMATINE) 10 MG tablet Place 10 mg into feeding tube daily as needed (for hypotension prior to PT/OT PRN for BP).    [provider]  midodrine (PROAMATINE) 10 MG tablet Place 10 mg into feeding tube 2 (two) times daily. Give before dialysis    [provider]  Multiple Vitamin (MULTIVITAMIN WITH MINERALS) TABS tablet Place 1 tablet into feeding tube daily. Centrum for Men 50 plus: on Monday, Wednesday, Friday (dialysis days): take one tablet daily with breakfast; on Sunday, Tuesday, Thursday, Saturday: take one tablet daily with supper    [provider]  Nutritional Supplements (FEEDING SUPPLEMENT, NEPRO CARB STEADY,) LIQD Place 1,000 mLs into feeding tube daily. 09/28/20   Fritzi Mandes, MD  omeprazole (FIRST-OMEPRAZOLE) 2 mg/mL SUSP oral suspension Place 40 mg into feeding tube daily.    [provider]  ondansetron (ZOFRAN) 4 MG tablet Place 4 mg into feeding  tube every 6 (six) hours as needed for nausea or vomiting.    [provider]  oxyCODONE (ROXICODONE) 5 MG immediate release tablet Take 1 tablet (5 mg total) by mouth every 4 (four) hours as needed for severe pain. 10/11/20   Tacy Learn, PA-C  pravastatin (PRAVACHOL) 10 MG tablet Place 10 mg into feeding tube at bedtime. 03/16/20   [provider]  predniSONE (DELTASONE) 5 MG tablet Take 1 tablet (5 mg total) by G tube once daily . Crush and administer via GTube daily. 09/16/20 09/16/21  [provider]  sulfamethoxazole-trimethoprim (BACTRIM,SEPTRA) 400-80 MG tablet Place 1 tablet into feeding tube See admin instructions. On Monday, Wednesday, Friday (dialysis days): take one tablet by mouth daily with supper 02/27/17   [provider]  valganciclovir (VALCYTE) 50 MG/ML SOLR Take 450 mg by mouth See admin instructions. Take one tablet (450 mg) by mouth on Monday, Wednesday, and Friday after dialysis 03/26/15   [provider]  vitamin B-6 (PYRIDOXINE) 25 MG tablet Place 25 mg into feeding tube daily with breakfast.    [provider]     Critical care time: 45 minutes    Georgeanna Lea, MS4

## 2020-10-14 ENCOUNTER — Inpatient Hospital Stay (HOSPITAL_COMMUNITY): Payer: Medicare Other

## 2020-10-14 DIAGNOSIS — E861 Hypovolemia: Secondary | ICD-10-CM

## 2020-10-14 DIAGNOSIS — I953 Hypotension of hemodialysis: Secondary | ICD-10-CM

## 2020-10-14 DIAGNOSIS — J8410 Pulmonary fibrosis, unspecified: Secondary | ICD-10-CM

## 2020-10-14 DIAGNOSIS — N186 End stage renal disease: Secondary | ICD-10-CM

## 2020-10-14 LAB — CBC
HCT: 26.4 % — ABNORMAL LOW (ref 39.0–52.0)
Hemoglobin: 8.3 g/dL — ABNORMAL LOW (ref 13.0–17.0)
MCH: 32.2 pg (ref 26.0–34.0)
MCHC: 31.4 g/dL (ref 30.0–36.0)
MCV: 102.3 fL — ABNORMAL HIGH (ref 80.0–100.0)
Platelets: 204 10*3/uL (ref 150–400)
RBC: 2.58 MIL/uL — ABNORMAL LOW (ref 4.22–5.81)
RDW: 20 % — ABNORMAL HIGH (ref 11.5–15.5)
WBC: 15.3 10*3/uL — ABNORMAL HIGH (ref 4.0–10.5)
nRBC: 0 % (ref 0.0–0.2)

## 2020-10-14 LAB — BASIC METABOLIC PANEL
Anion gap: 14 (ref 5–15)
BUN: 39 mg/dL — ABNORMAL HIGH (ref 8–23)
CO2: 24 mmol/L (ref 22–32)
Calcium: 8.1 mg/dL — ABNORMAL LOW (ref 8.9–10.3)
Chloride: 101 mmol/L (ref 98–111)
Creatinine, Ser: 4.85 mg/dL — ABNORMAL HIGH (ref 0.61–1.24)
GFR, Estimated: 12 mL/min — ABNORMAL LOW (ref >60)
Glucose, Bld: 63 mg/dL — ABNORMAL LOW (ref 70–99)
Potassium: 3.5 mmol/L (ref 3.5–5.1)
Sodium: 139 mmol/L (ref 135–145)

## 2020-10-14 LAB — GLUCOSE, CAPILLARY
Glucose-Capillary: 146 mg/dL — ABNORMAL HIGH (ref 70–99)
Glucose-Capillary: 148 mg/dL — ABNORMAL HIGH (ref 70–99)
Glucose-Capillary: 116 mg/dL — ABNORMAL HIGH (ref 70–99)
Glucose-Capillary: 203 mg/dL — ABNORMAL HIGH (ref 70–99)

## 2020-10-14 MED ORDER — PENTAFLUOROPROP-TETRAFLUOROETH EX AERO: 1.0000 "application " | INHALATION_SPRAY | CUTANEOUS | Status: DC | PRN

## 2020-10-14 MED ORDER — HYDROCORTISONE NA SUCCINATE PF 100 MG IJ SOLR
50.0000 mg | Freq: Two times a day (BID) | INTRAMUSCULAR | Status: DC
  Administered 2020-10-14 – 2020-10-15 (×2): 50 mg via INTRAVENOUS
  Filled 2020-10-14 (×2): qty 2

## 2020-10-14 MED ORDER — INSULIN ASPART 100 UNIT/ML IJ SOLN
0.0000 [IU] | Freq: Three times a day (TID) | INTRAMUSCULAR | Status: DC
  Administered 2020-10-15 (×2): 2 [IU] via SUBCUTANEOUS
  Administered 2020-10-15 – 2020-10-16 (×2): 1 [IU] via SUBCUTANEOUS
  Administered 2020-10-16: 2 [IU] via SUBCUTANEOUS
  Administered 2020-10-17: 5 [IU] via SUBCUTANEOUS
  Administered 2020-10-17: 3 [IU] via SUBCUTANEOUS
  Administered 2020-10-18: 1 [IU] via SUBCUTANEOUS
  Administered 2020-10-18: 3 [IU] via SUBCUTANEOUS

## 2020-10-14 MED ORDER — HEPARIN SODIUM (PORCINE) 1000 UNIT/ML DIALYSIS: 1000.0000 [IU] | INTRAMUSCULAR | Status: DC | PRN

## 2020-10-14 MED ORDER — LIDOCAINE HCL (PF) 1 % IJ SOLN: 5.0000 mL | INTRAMUSCULAR | Status: DC | PRN

## 2020-10-14 MED ORDER — SODIUM CHLORIDE 0.9 % IV SOLN: 100.0000 mL | INTRAVENOUS | Status: DC | PRN

## 2020-10-14 MED ORDER — LIDOCAINE-PRILOCAINE 2.5-2.5 % EX CREA: 1.0000 "application " | TOPICAL_CREAM | CUTANEOUS | Status: DC | PRN

## 2020-10-14 NOTE — Progress Notes (Addendum)
[facility] Progress Note   Subjective:  Out of ICU, on renal floor this am. No events overnight.  Talking on phone with wife. Denies cp, sob, n/v. For dialysis today.   Objective Vitals:   10/13/20 2041 10/13/20 2300 10/14/20 0454 10/14/20 0600  BP: 135/61 (!) 132/58 132/72   Pulse: 79 81 75   Resp: 18 16 18    Temp: 98.1 F (36.7 C) 98.3 F (36.8 C) 97.7 F (36.5 C)   TempSrc:  Oral Oral   SpO2: 100% 99% 100%   Weight:    63 kg  Height:         Additional Objective Labs: Basic Metabolic Panel: Recent Labs  Lab 10/12/20 2038 10/12/20 2103 10/12/20 2119 10/13/20 0106 10/13/20 0208 10/14/20 0349  NA 140   < > 140  --  142 139  K 3.5   < > 3.3*  --  3.2* 3.5  CL 96*  --  97*  --  100 101  CO2 28  --   --   --  28 24  GLUCOSE 114*  --  112*  --  91 63*  BUN 14  --  14  --  12 39*  CREATININE 3.13*  --  2.90* 3.27* 3.29* 4.85*  CALCIUM 7.8*  --   --   --  7.8* 8.1*  PHOS  --   --   --   --  4.1  --    < > = values in this interval not displayed.   CBC: Recent Labs  Lab 10/12/20 2038 10/12/20 2103 10/13/20 0106 10/13/20 0208 10/14/20 0349  WBC 14.8*  --  17.0* 18.3* 15.3*  NEUTROABS 11.0*  --   --   --   --   HGB 9.7*   < > 7.8* 9.2* 8.3*  HCT 31.8*   < > 25.8* 30.9* 26.4*  MCV 105.3*  --  106.2* 107.3* 102.3*  PLT 229  --  201 206 204   < > = values in this interval not displayed.   Blood Culture    Component Value Date/Time   SDES BLOOD SITE NOT SPECIFIED 10/13/2020 0002   SPECREQUEST  10/13/2020 0002    BOTTLES DRAWN AEROBIC AND ANAEROBIC Blood Culture adequate volume   CULT  10/13/2020 0002    NO GROWTH 1 DAY Performed at [facility], [place] [address], [place], [place] [address]    REPTSTATUS PENDING 10/13/2020 0002     Physical Exam General: Frail appearing, nad  Heart: RRR  Lungs: Clear bilaterally, no wheeze, no rales  Abdomen: soft non -tender  Extremities: no LE edema  Dialysis Access: LUE AVF +bruit    Medications: . piperacillin-tazobactam (ZOSYN)  IV 2.25 g (10/14/20 0835)   . Chlorhexidine Gluconate Cloth  6 each Topical Daily  . cycloSPORINE modified  100 mg Oral BID   And  . cycloSPORINE modified  25 mg Oral BID  . dorzolamide-timolol  1 drop Both Eyes BID  . heparin  5,000 Units Subcutaneous Q8H  . hydrocortisone sod succinate (SOLU-CORTEF) inj  50 mg Intravenous Q6H  . insulin aspart  0-15 Units Subcutaneous TID WC  . insulin aspart  0-5 Units Subcutaneous QHS  . mouth rinse  15 mL Mouth Rinse BID  . midodrine  10 mg Oral q12n4p  . pantoprazole (PROTONIX) IV  40 mg Intravenous Daily  . sulfamethoxazole-trimethoprim  1 tablet Oral Once per day on Mon Wed Fri  . valGANciclovir  450 mg Oral  Once per day on Mon Wed Fri    Dialysis Orders:  [facility] MWF 4h 400/800  EDW 58kg 2K/2Ca   Assessment/Plan: 1. Sepsis w/u - Hypotensive on admission req pressor support with leukocytosis. Blood cultures ngtd. Empiric antibiotics started --per primary  2. ESRD -  HD MWF. HD today on schedule.. Added K+ bath  3. Hypotension/volume  - BP improved.  Hx of hypotension; has used midodrine in the past. Has not been reaching dry weight as outpatient. Gentle UF. Follow weights here.  4. Anemia  - Hgb 8.3. ESA held at dialysis center d/t new dx lung ca. Transfuse prn. Hx GI bleed. Hold heparin  5. Metabolic bone disease -  Corr Ca/Phos ok. Follow trends.  6. Nutrition - Renal/carb modified diet. Prot supp. Still has PEG tube from prior admission. Not using 7. S/p lung transplant --on IS -per primary 8. LUL mass/Stage 2B NSCLC   Lynnda Child PA-C [facility] 10/14/2020,9:34 AM  Seen and examined independently this AM.  Agree with note and exam as documented above by physician extender and as noted here.  States "I feel great" this AM.  He was moved to floor   General adult male in bed in no acute distress HEENT normocephalic atraumatic extraocular movements intact  sclera anicteric Neck supple trachea midline Lungs left basilar crackles (pt states his baseline); normal work of breathing at rest; on 2 liters oxygen Heart S1S2 no rub  Abdomen soft nontender nondistended Extremities no edema  Psych normal mood and affect Neuro - alert and oriented x 3 provides hx and follows commands Access: Left AVF with bruit  Sepsis - abx per primary. Blood cx NGTD. Flu and covid negative.   ESRD on HD MWF  Chronic hypotension on midodrine. On hydrocortisone per primary  Anemia CKD - defer ESA given lung cancer  Lung transplant - immunosuppresson per primary team   NSCLC - no ESA's  Claudia Desanctis, MD 10/14/2020  12:06 PM

## 2020-10-14 NOTE — Progress Notes (Signed)
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  Patient reported that after dialysis he noted pain in L arm was getting worse, with some generalized fatigue.  On arrival to the ED, he was found to be hypotensive.  Reports subjective fever.  Patient denies any chest pain/chest pressure/shortness of breath/chills/sore throat/headache/dizziness, nausea or vomiting.  No pain in the lower extremities.  Of note, patient required hospitalization in [facility] in April 2022 for respiratory failure, diagnosis and treatment of lung cancer with radiation and subsequent GJ tube placement. PCCM admitted, was placed on Levophed, subsequently weaned off. [facility] hospitalist assumed care on 10/14/2020.    Today, patient denies any new complaints, was worried if his fistula is infected.  For HD today.  Denies any chest pain, abdominal pain, nausea/vomiting, fever/chills.       Assessment/Plan: Active Problems:   Septic shock (HCC)   Likely hypovolemic shock s/p HD- BP improved History of chronic hypotension-s/p IVF and levophed, weaned off Rule out sepsis Currently afebrile, with leukocytosis (on steroids) BC x2 NGTD Chest x-ray unremarkable Continue midodrine Continue Zosyn, plan to de-escalate Plan to taper stress dose steroids  Pulmonary fibrosis s/p single lung transplant in 2016 on immunosuppressants Currently on stress dose steroids, plan to taper (5 mg prednisone daily at home) Continue cyclosporine, valganciclovir, Bactrim  ESRD HD per nephrology M/W/F  Anemia of chronic kidney disease Hemoglobin around baseline Daily CBC  Diabetes mellitus type 2 Continue SSI,  Accu-Cheks, hypoglycemic protocol  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   Weight as of this encounter: 63 kg.     Code Status: Full  Family Communication: None at bedside  Disposition Plan: Status is: Inpatient  Remains inpatient appropriate because:Inpatient level of care appropriate due to severity of illness   Dispo: The patient is from: Home              Anticipated d/c is to: Home              Patient currently is not medically stable to d/c.   Difficult to place patient No    Consultants:  PCCM  Procedures:  None  Antimicrobials:  Zosyn  DVT prophylaxis: Heparin   Objective: Vitals:   10/13/20 2300 10/14/20 0454 10/14/20 0600 10/14/20 1051  BP: (!) 132/58 132/72  130/65  Pulse: 81 75  77  Resp: 16 18  17   Temp: 98.3 F (36.8 C) 97.7 F (36.5 C)  98.1 F (36.7 C)  TempSrc: Oral Oral    SpO2: 99% 100%  100%  Weight:   63 kg   Height:        Intake/Output Summary (Last 24 hours) at 10/14/2020 1058 Last data filed at 10/14/2020 0800 Gross per 24 hour  Intake 590 ml  Output 1 ml  Net 589 ml   Filed Weights   10/12/20 2003 10/13/20 0158 10/14/20 0600  Weight: 76.2 kg 61.2 kg 63 kg    Exam:  General:  NAD   Cardiovascular: S1, S2 present  Respiratory: CTAB  Abdomen: Soft, nontender, nondistended, bowel sounds present, GJ tube noted  Musculoskeletal: No bilateral pedal edema noted, LUE fistula noted  Skin: Normal  Psychiatry: Normal mood    Data Reviewed: CBC: Recent Labs  Lab 10/12/20 2038 10/12/20 2103 10/12/20 2119 10/13/20 0106 10/13/20 0208 10/14/20 0349  WBC 14.8*  --   --  17.0* 18.3* 15.3*  NEUTROABS 11.0*  --   --   --   --   --   HGB 9.7* 9.5* 9.2* 7.8* 9.2* 8.3*  HCT 31.8* 28.0* 27.0* 25.8* 30.9*  26.4*  MCV 105.3*  --   --  106.2* 107.3* 102.3*  PLT 229  --   --  201 206 185   Basic Metabolic Panel: Recent Labs  Lab 10/12/20 2038 10/12/20 2103 10/12/20 2119 10/13/20 0106 10/13/20 0208 10/14/20 0349  NA 140 141 140  --  142 139  K 3.5 3.3* 3.3*  --  3.2* 3.5  CL 96*  --  97*  --  100 101  CO2 28  --   --   --  28 24  GLUCOSE 114*  --  112*  --  91 63*  BUN 14  --  14  --  12 39*  CREATININE 3.13*  --  2.90* 3.27* 3.29* 4.85*  CALCIUM 7.8*  --   --   --  7.8* 8.1*  MG  --   --   --   --  1.9  --   PHOS  --   --   --   --  4.1  --    GFR: Estimated Creatinine Clearance: 11.5 mL/min (A) (by C-G formula based on SCr of 4.85 mg/dL (H)). Liver Function Tests: Recent Labs  Lab 10/12/20 2038  AST 40  ALT 26  ALKPHOS 643*  BILITOT 3.3*  PROT 6.1*  ALBUMIN 1.9*   No results for input(s): LIPASE, AMYLASE in the last 168 hours. No results for input(s): AMMONIA in the last 168 hours. Coagulation Profile: No results for input(s): INR, PROTIME in the last 168 hours. Cardiac Enzymes: No results for input(s): CKTOTAL, CKMB, CKMBINDEX, TROPONINI in the last 168 hours. BNP (last 3 results) No results for input(s): PROBNP in the last 8760 hours. HbA1C: Recent Labs    10/13/20 0208  HGBA1C 5.2   CBG: Recent Labs  Lab 10/13/20 0151 10/13/20 1315 10/13/20 1523 10/13/20 2054 10/14/20 0802  GLUCAP 89 146* 117* 242* 146*   Lipid Profile: No results for input(s): CHOL, HDL, LDLCALC, TRIG, CHOLHDL, LDLDIRECT in the last 72 hours. Thyroid Function Tests: No results for input(s): TSH, T4TOTAL, FREET4, T3FREE, THYROIDAB in the last 72 hours. Anemia Panel: No results for input(s): VITAMINB12, FOLATE, FERRITIN, TIBC, IRON, RETICCTPCT in the last 72 hours. Urine analysis:    Component Value Date/Time   COLORURINE AMBER (A) 12/31/2015 0515   APPEARANCEUR CLOUDY (A) 12/31/2015 0515   LABSPEC 1.016 12/31/2015 0515   PHURINE 7.0 12/31/2015 0515   GLUCOSEU NEGATIVE  12/31/2015 0515   HGBUR NEGATIVE 12/31/2015 0515   BILIRUBINUR SMALL (A) 12/31/2015 0515   KETONESUR 15 (A) 12/31/2015 0515   PROTEINUR 100 (A) 12/31/2015 0515   UROBILINOGEN 2.0 (H) 08/15/2013 1916   NITRITE NEGATIVE 12/31/2015 0515   LEUKOCYTESUR NEGATIVE 12/31/2015 0515   Sepsis Labs: @LABRCNTIP (procalcitonin:4,lacticidven:4)  ) Recent Results (from the past 240 hour(s))  Blood culture (routine x 2)     Status: None (Preliminary result)   Collection Time:  10/12/20 10:04 PM   Specimen: BLOOD  Result Value Ref Range Status   Specimen Description BLOOD RIGHT HAND  Final   Special Requests   Final    BOTTLES DRAWN AEROBIC AND ANAEROBIC Blood Culture results may not be optimal due to an inadequate volume of blood received in culture bottles   Culture   Final    NO GROWTH 2 DAYS Performed at [facility], [place] [address], [place], [place] [address]    Report Status PENDING  Incomplete  Resp Panel by RT-PCR (Flu A&B, Covid) Nasopharyngeal Swab     Status: None   Collection Time: 10/12/20 10:52 PM   Specimen: Nasopharyngeal Swab; Nasopharyngeal(NP) swabs in vial transport medium  Result Value Ref Range Status   SARS Coronavirus 2 by RT PCR NEGATIVE NEGATIVE Final    Comment: (NOTE) SARS-CoV-2 target nucleic acids are NOT DETECTED.  The SARS-CoV-2 RNA is generally detectable in upper respiratory specimens during the acute phase of infection. The lowest concentration of SARS-CoV-2 viral copies this assay can detect is 138 copies/mL. A negative result does not preclude SARS-Cov-2 infection and should not be used as the sole basis for treatment or other patient management decisions. A negative result may occur with  improper specimen collection/handling, submission of specimen other than nasopharyngeal swab, presence of viral mutation(s) within the areas targeted by this assay, and inadequate number of viral copies(<138 copies/mL). A negative result must be combined  with clinical observations, patient history, and epidemiological information. The expected result is Negative.  Fact Sheet for Patients:  EntrepreneurPulse.com.au  Fact Sheet for Healthcare Providers:  IncredibleEmployment.be  This test is no t yet approved or cleared by the [place] FDA and  has been authorized for detection and/or diagnosis of SARS-CoV-2 by FDA under an Emergency Use Authorization (EUA). This EUA will remain  in effect (meaning this test can be used) for the duration of the COVID-19 declaration under Section 564(b)(1) of the Act, 21 U.S.C.section 360bbb-3(b)(1), unless the authorization is terminated  or revoked sooner.       Influenza A by PCR NEGATIVE NEGATIVE Final   Influenza B by PCR NEGATIVE NEGATIVE Final    Comment: (NOTE) The Xpert Xpress SARS-CoV-2/FLU/RSV plus assay is intended as an aid in the diagnosis of influenza from Nasopharyngeal swab specimens and should not be used as a sole basis for treatment. Nasal washings and aspirates are unacceptable for Xpert Xpress SARS-CoV-2/FLU/RSV testing.  Fact Sheet for Patients: EntrepreneurPulse.com.au  Fact Sheet for Healthcare Providers: IncredibleEmployment.be  This test is not yet approved or cleared by the [place] FDA and has been authorized for detection and/or diagnosis of SARS-CoV-2 by FDA under an Emergency Use Authorization (EUA). This EUA will remain in effect (meaning this test can be used) for the duration of the COVID-19 declaration under Section 564(b)(1) of the Act, 21 U.S.C. section 360bbb-3(b)(1), unless the authorization is terminated or revoked.  Performed at [facility], [place] [address], [place], [place] [address]   Blood culture (routine x 2)     Status: None (Preliminary result)   Collection Time: 10/13/20 12:02 AM   Specimen: BLOOD  Result Value Ref Range Status   Specimen Description  BLOOD SITE NOT SPECIFIED  Final   Special Requests   Final    BOTTLES DRAWN AEROBIC AND ANAEROBIC Blood Culture adequate volume   Culture   Final    NO GROWTH 1 DAY Performed at [facility], [place] [address], [place], [place]  [address]    Report Status PENDING  Incomplete  MRSA PCR Screening     Status: None   Collection Time: 10/13/20  1:58 AM   Specimen: Nasopharyngeal  Result Value Ref Range Status   MRSA by PCR NEGATIVE NEGATIVE Final    Comment:        The GeneXpert MRSA Assay (FDA approved for NASAL specimens only), is one component of a comprehensive MRSA colonization surveillance program. It is not intended to diagnose MRSA infection nor to guide or monitor treatment for MRSA infections. Performed at [facility], [place] [address], [place], [place] [address]       Studies: DG Chest Port 1 View  Result Date: 10/14/2020 CLINICAL DATA:  Shortness of breath EXAM: PORTABLE CHEST 1 VIEW COMPARISON:  10/12/2020 FINDINGS: Postoperative changes. Heart is normal size. Aortic calcifications. Chronic fibrotic changes throughout the left lung again noted, stable. Scarring in the right lung base. No change since prior study. IMPRESSION: No interval change. Electronically Signed   By: Rolm Baptise M.D.   On: 10/14/2020 08:24    Scheduled Meds: . Chlorhexidine Gluconate Cloth  6 each Topical Daily  . cycloSPORINE modified  100 mg Oral BID   And  . cycloSPORINE modified  25 mg Oral BID  . dorzolamide-timolol  1 drop Both Eyes BID  . heparin  5,000 Units Subcutaneous Q8H  . hydrocortisone sod succinate (SOLU-CORTEF) inj  50 mg Intravenous Q6H  . insulin aspart  0-15 Units Subcutaneous TID WC  . insulin aspart  0-5 Units Subcutaneous QHS  . mouth rinse  15 mL Mouth Rinse BID  . midodrine  10 mg Oral q12n4p  . pantoprazole (PROTONIX) IV  40 mg Intravenous Daily  . sulfamethoxazole-trimethoprim  1 tablet Oral Once per day on Mon Wed Fri  . valGANciclovir  450 mg Oral Once  per day on Mon Wed Fri    Continuous Infusions: . piperacillin-tazobactam (ZOSYN)  IV 2.25 g (10/14/20 0835)     LOS: 1 day     Alma Friendly, MD [facility]  If 7PM-7AM, please contact night-coverage [email or web address] 10/14/2020, 10:58 AM

## 2020-10-14 NOTE — Progress Notes (Signed)
Inpatient Diabetes Program Recommendations  AACE/ADA: New Consensus Statement on Inpatient Glycemic Control (2015)  Target Ranges:  Prepandial:   less than 140 mg/dL      Peak postprandial:   less than 180 mg/dL (1-2 hours)      Critically ill patients:  140 - 180 mg/dL   Lab Results  Component Value Date   GLUCAP 148 (H) 10/14/2020   HGBA1C 5.2 10/13/2020    Review of Glycemic Control Results for [name], [name] (MRN [id number]) as of 10/14/2020 12:35  Ref. Range 10/13/2020 13:15 10/13/2020 15:23 10/13/2020 20:54 10/14/2020 08:02 10/14/2020 11:53  Glucose-Capillary Latest Ref Range: 70 - 99 mg/dL 146 (H) 117 (H) 242 (H) 146 (H) 148 (H)   Diabetes history: DM 2 Outpatient Diabetes medications:  Novolin R 1-4 units tid with meals Current orders for Inpatient glycemic control:  Novolog moderate tid with meals and HS  Inpatient Diabetes Program Recommendations:    Note history of ESRD.  Consider reducing Novolog correction to sensitive tid with meals and HS.   Thanks  Adah Perl, RN, BC-ADM Inpatient Diabetes Coordinator Pager [phone] (8a-5p)

## 2020-10-15 LAB — CBC WITH DIFFERENTIAL/PLATELET
Abs Immature Granulocytes: 0 10*3/uL (ref 0.00–0.07)
Basophils Absolute: 0 10*3/uL (ref 0.0–0.1)
Basophils Relative: 0 %
Eosinophils Absolute: 0 10*3/uL (ref 0.0–0.5)
Eosinophils Relative: 0 %
HCT: 26.9 % — ABNORMAL LOW (ref 39.0–52.0)
Hemoglobin: 8.6 g/dL — ABNORMAL LOW (ref 13.0–17.0)
Lymphocytes Relative: 4 %
Lymphs Abs: 0.6 10*3/uL — ABNORMAL LOW (ref 0.7–4.0)
MCH: 32.2 pg (ref 26.0–34.0)
MCHC: 32 g/dL (ref 30.0–36.0)
MCV: 100.7 fL — ABNORMAL HIGH (ref 80.0–100.0)
Monocytes Absolute: 1.6 10*3/uL — ABNORMAL HIGH (ref 0.1–1.0)
Monocytes Relative: 10 %
Neutro Abs: 13.8 10*3/uL — ABNORMAL HIGH (ref 1.7–7.7)
Neutrophils Relative %: 86 %
Platelets: 198 10*3/uL (ref 150–400)
RBC: 2.67 MIL/uL — ABNORMAL LOW (ref 4.22–5.81)
RDW: 19.5 % — ABNORMAL HIGH (ref 11.5–15.5)
WBC: 16.1 10*3/uL — ABNORMAL HIGH (ref 4.0–10.5)
nRBC: 0 % (ref 0.0–0.2)
nRBC: 1 /100 WBC — ABNORMAL HIGH

## 2020-10-15 LAB — BASIC METABOLIC PANEL
Anion gap: 12 (ref 5–15)
BUN: 22 mg/dL (ref 8–23)
CO2: 25 mmol/L (ref 22–32)
Calcium: 8.3 mg/dL — ABNORMAL LOW (ref 8.9–10.3)
Chloride: 100 mmol/L (ref 98–111)
Creatinine, Ser: 2.86 mg/dL — ABNORMAL HIGH (ref 0.61–1.24)
GFR, Estimated: 23 mL/min — ABNORMAL LOW (ref >60)
Glucose, Bld: 103 mg/dL — ABNORMAL HIGH (ref 70–99)
Potassium: 3.7 mmol/L (ref 3.5–5.1)
Sodium: 137 mmol/L (ref 135–145)

## 2020-10-15 LAB — GLUCOSE, CAPILLARY
Glucose-Capillary: 141 mg/dL — ABNORMAL HIGH (ref 70–99)
Glucose-Capillary: 182 mg/dL — ABNORMAL HIGH (ref 70–99)
Glucose-Capillary: 176 mg/dL — ABNORMAL HIGH (ref 70–99)
Glucose-Capillary: 81 mg/dL (ref 70–99)

## 2020-10-15 MED ORDER — PREDNISONE 20 MG PO TABS
40.0000 mg | ORAL_TABLET | Freq: Every day | ORAL | Status: DC
  Administered 2020-10-17: 40 mg via ORAL
  Filled 2020-10-15: qty 2

## 2020-10-15 NOTE — Evaluation (Signed)
Physical Therapy Evaluation Patient Details Name: [name] MRN: [id number] DOB: March 26, 1951 Today's Date: 10/15/2020   History of Present Illness  70 yo male presenting to ED 10/12/20 with severe left arm pain above his fistula. In ED found to be hypotensive. Admitted for treatment of hypovolemic shock  PMH includes: DM II, ESRD on HD, HTN, OAS, HLD, and BOLT lung transplant.  Clinical Impression  PTA pt had just d/c'd from SNF rehab with L-sided weakness, (no evidence of CVA in notes). Pt lives with wife in single story home with 3 steps to enter. Pt reports being able to ambulate in his home with Rollator and is independent in bird bathing and dressing, wife provides for iADLs. Pt reports difficulty with navigating 3 steps into his home to get to dialysis. Pt is limited in safe mobility by weakness L>R and related decreased balance. Pt is currently supervision for bed mobility, min guard for transfers from bed and minA for transfers from lower toilet surface. Pt able to ambulate with RW and min guard assist. PT recommends resumption of HHPT at discharge and will continue to follow pt acutely.     Follow Up Recommendations Home health PT;Supervision for mobility/OOB    Equipment Recommendations  None recommended by PT (has necessary equipment)       Precautions / Restrictions Precautions Precautions: Fall Precaution Comments: L sided weaknes Restrictions Weight Bearing Restrictions: No      Mobility  Bed Mobility Overal bed mobility: Needs Assistance Bed Mobility: Supine to Sit     Supine to sit: Supervision;HOB elevated     General bed mobility comments: increased time and effort to come to side of bed with use of bed rail    Transfers Overall transfer level: Needs assistance Equipment used: Rolling walker (2 wheeled) Transfers: Sit to/from Stand Sit to Stand: Min guard;Min assist         General transfer comment: min guard for safety, good power up use of bed  on posterior LE to stabilize, min A for power up from low toilet seat  Ambulation/Gait Ambulation/Gait assistance: Min guard;Min assist Gait Distance (Feet): 20 Feet Assistive device: Rolling walker (2 wheeled) Gait Pattern/deviations: Step-through pattern;Decreased step length - right;Decreased step length - left;Decreased dorsiflexion - left;Shuffle     General Gait Details: initially provided contact guard assist for safety able to progress to min guard for slowed gait        Balance Overall balance assessment: Needs assistance Sitting-balance support: Feet supported;No upper extremity supported Sitting balance-Leahy Scale: Good     Standing balance support: Bilateral upper extremity supported Standing balance-Leahy Scale: Poor Standing balance comment: requires assist to attain and maintain balance                             Pertinent Vitals/Pain Pain Assessment: No/denies pain    Home Living Family/patient expects to be discharged to:: Private residence Living Arrangements: Spouse/significant other Available Help at Discharge: Family;Available 24 hours/day Type of Home: House Home Access: Stairs to enter Entrance Stairs-Rails: Left Entrance Stairs-Number of Steps: 3 Home Layout: One level Home Equipment: Walker - 4 wheels;Cane - single point      Prior Function Level of Independence: Needs assistance   Gait / Transfers Assistance Needed: L sided weakness, uses Rollator for household distances, has difficulty with steps  ADL's / Homemaking Assistance Needed: performs bathing and dressing, wife assists with iADLs,manages medications  and takes him to dialysis  Hand Dominance   Dominant Hand: Right    Extremity/Trunk Assessment   Upper Extremity Assessment Upper Extremity Assessment: Defer to OT evaluation    Lower Extremity Assessment Lower Extremity Assessment: RLE deficits/detail;LLE deficits/detail RLE Deficits / Details: ROM WFL  strength 4+/5 RLE Sensation: WNL LLE Deficits / Details: ROM WFL hip and knee strength grossly 4/5, dorsiflexion 3+/5, L toe pain, appears to have possible ingrown great toe nail LLE Sensation: decreased light touch    Cervical / Trunk Assessment Cervical / Trunk Assessment: Normal  Communication   Communication: No difficulties  Cognition Arousal/Alertness: Awake/alert Behavior During Therapy: WFL for tasks assessed/performed Overall Cognitive Status: Within Functional Limits for tasks assessed                                        General Comments General comments (skin integrity, edema, etc.): VSS on 2L O2 via [place]        Assessment/Plan    PT Assessment Patient needs continued PT services  PT Problem List Decreased strength;Decreased activity tolerance;Decreased balance;Decreased mobility;Impaired sensation       PT Treatment Interventions DME instruction;Gait training;Stair training;Functional mobility training;Therapeutic activities;Therapeutic exercise;Balance training;Cognitive remediation;Neuromuscular re-education;Patient/family education    PT Goals (Current goals can be found in the Care Plan section)  Acute Rehab PT Goals Patient Stated Goal: have less difficulty getting up stairs PT Goal Formulation: With patient Time For Goal Achievement: 10/29/20 Potential to Achieve Goals: Good    Frequency Min 3X/week    AM-PAC PT "6 Clicks" Mobility  Outcome Measure Help needed turning from your back to your side while in a flat bed without using bedrails?: None Help needed moving from lying on your back to sitting on the side of a flat bed without using bedrails?: A Little Help needed moving to and from a bed to a chair (including a wheelchair)?: None Help needed standing up from a chair using your arms (e.g., wheelchair or bedside chair)?: None Help needed to walk in hospital room?: None Help needed climbing 3-5 steps with a railing? : A Little 6  Click Score: 22    End of Session Equipment Utilized During Treatment: Gait belt;Oxygen Activity Tolerance: Patient tolerated treatment well Patient left: with call bell/phone within reach;in chair;with chair alarm set Nurse Communication: Mobility status;Other (comment) (look at great toe nail) PT Visit Diagnosis: Unsteadiness on feet (R26.81);Other abnormalities of gait and mobility (R26.89);Muscle weakness (generalized) (M62.81);Difficulty in walking, not elsewhere classified (R26.2);Hemiplegia and hemiparesis (L sided weakness) Hemiplegia - Right/Left: Left Hemiplegia - dominant/non-dominant: Non-dominant Hemiplegia - caused by: Unspecified    Time: 3888-2800 PT Time Calculation (min) (ACUTE ONLY): 54 min   Charges:   PT Evaluation $PT Eval Moderate Complexity: 1 Mod PT Treatments $Gait Training: 8-22 mins $Therapeutic Activity: 23-37 mins        Honore Wipperfurth B. Migdalia Dk PT, DPT Acute Rehabilitation Services Pager [phone] Office [phone]   [place] 10/15/2020, 10:18 AM

## 2020-10-15 NOTE — Progress Notes (Signed)
Pharmacy Antibiotic Note  [name] is a 70 y.o. male admitted on 10/12/2020 with sepsis.  Pharmacy has been consulted for Zosyn dosing.  WBC remains elevated (on chronic steroids).  H/o ESRD on MWF HD with last HD session 5/11.  Plan: -Continue zosyn 2.25 gm IV Q 8 hours -Monitor CBC, cultures and clinical progress  Height: 5' 3.5" (161.3 cm) Weight: 59.2 kg (130 lb 8.2 oz) IBW/kg (Calculated) : 58.05  Temp (24hrs), Avg:97.8 F (36.6 C), Min:97.4 F (36.3 C), Max:98.2 F (36.8 C)  Recent Labs  Lab 10/12/20 2038 10/12/20 2119 10/12/20 2340 10/13/20 0106 10/13/20 0208 10/14/20 0349 10/15/20 0337  WBC 14.8*  --   --  17.0* 18.3* 15.3* 16.1*  CREATININE 3.13* 2.90*  --  3.27* 3.29* 4.85* 2.86*  LATICACIDVEN 3.5*  --  1.5  --   --   --   --     Estimated Creatinine Clearance: 19.5 mL/min (A) (by C-G formula based on SCr of 2.86 mg/dL (H)).    Allergies  Allergen Reactions  . Cefepime Other (See Comments)    Altered mental status  . Levofloxacin Other (See Comments)    LOSS OF CONSCIOUSNESS  . Nsaids Other (See Comments)    Patient instructed not to take NSAID's after his lung transplant  . Tolmetin     Antimicrobials this admission: Zosyn 5/9 >>  Vancomycin 5/9 >> 5/10  Dose adjustments this admission:  Microbiology results: 5/9 BCx: ngtd   Thank you for allowing pharmacy to be a part of this patient's care.  [employer], Pharm.D., BCPS Clinical Pharmacist  **Pharmacist phone directory can be found on [email or web address] listed under [place].  10/15/2020 11:03 AM

## 2020-10-15 NOTE — Plan of Care (Signed)
  Problem: Education: Goal: Knowledge of General Education information will improve Description: Including pain rating scale, medication(s)/side effects and non-pharmacologic comfort measures Outcome: Progressing   Problem: Clinical Measurements: Goal: Ability to maintain clinical measurements within normal limits will improve Outcome: Progressing   Problem: Activity: Goal: Risk for activity intolerance will decrease Outcome: Progressing   Problem: Nutrition: Goal: Adequate nutrition will be maintained Outcome: Progressing   Problem: Safety: Goal: Ability to remain free from injury will improve Outcome: Progressing

## 2020-10-15 NOTE — Progress Notes (Signed)
PROGRESS NOTE  [name] ZOX:[id number] DOB: 1951/02/06 DOA: 10/12/2020 PCP: Wenda Low, MD  HPI/Recap of past 24 hours: HPI from PCCM 70 year old male with history of interstitial lung disease s/p R single lung transplant on immunosuppression, ESRD on HD M/W/F, HTN, DM, OSA, presents with complains of severe left arm pain proximal to the area of his fistula.  Patient reported that after dialysis he noted pain in L arm was getting worse, with some generalized fatigue.  On arrival to the ED, he was found to be hypotensive.  Reports subjective fever.  Patient denies any chest pain/chest pressure/shortness of breath/chills/sore throat/headache/dizziness, nausea or vomiting.  No pain in the lower extremities.  Of note, patient required hospitalization in [facility] in April 2022 for respiratory failure, diagnosis and treatment of lung cancer with radiation and subsequent GJ tube placement. PCCM admitted, was placed on Levophed, subsequently weaned off. [facility] hospitalist assumed care on 10/14/2020.    Today, patient denies any new complaints, denies any chest pain, worsening shortness of breath, abdominal pain, nausea/vomiting, fever/chills.      Assessment/Plan: Active Problems:   Septic shock (HCC)   Likely hypovolemic shock s/p HD- BP improved History of chronic hypotension-s/p IVF and levophed, weaned off Rule out sepsis Currently afebrile, with leukocytosis (on steroids) BC x2 NGTD Chest x-ray unremarkable Continue midodrine Continue Zosyn, plan to de-escalate Plan to taper stress dose steroids  Pulmonary fibrosis s/p single lung transplant in 2016 on immunosuppressants Currently on stress dose steroids, plan to taper (5 mg prednisone daily at home) Continue cyclosporine, valganciclovir, Bactrim  ESRD HD per nephrology M/W/F  Anemia of chronic kidney disease Hemoglobin around baseline Daily CBC  Diabetes mellitus type 2 Continue SSI, Accu-Cheks, hypoglycemic  protocol  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   Weight as of this encounter: 59.2 kg.     Code Status: Full  Family Communication: None at bedside  Disposition Plan: Status is: Inpatient  Remains inpatient appropriate because:Inpatient level of care appropriate due to severity of illness   Dispo: The patient is from: Home              Anticipated d/c is to: Home              Patient currently is not medically stable to d/c.   Difficult to place patient No    Consultants:  PCCM  Procedures:  None  Antimicrobials:  Zosyn  DVT prophylaxis: Heparin   Objective: Vitals:   10/14/20 2130 10/15/20 0611 10/15/20 0916 10/15/20 1704  BP: (!) 154/75 140/73 130/66 137/68  Pulse: 77 69 77 78  Resp: 18 18 18 18   Temp: 98.2 F (36.8 C) 97.6 F (36.4 C) 98 F (36.7 C) 98.4 F (36.9 C)  TempSrc: Oral Oral  Oral  SpO2: 98% 98% 100% 100%  Weight:      Height:        Intake/Output Summary (Last 24 hours) at 10/15/2020 1856 Last data filed at 10/15/2020 1834 Gross per 24 hour  Intake 660 ml  Output 0 ml  Net 660 ml   Filed Weights   10/14/20 0600 10/14/20 1240 10/14/20 1644  Weight: 63 kg 62.3 kg 59.2 kg    Exam:  General: NAD   Cardiovascular: S1, S2 present  Respiratory: CTAB  Abdomen: Soft, nontender, nondistended, bowel sounds present, GJ tube noted  Musculoskeletal: No bilateral pedal edema noted, LUE fistula noted  Skin: Normal  Psychiatry: Normal mood    Data Reviewed: CBC: Recent Labs  Lab 10/12/20 2038 10/12/20 2103 10/12/20 2119 10/13/20 0106 10/13/20 0208 10/14/20 0349 10/15/20 0337  WBC 14.8*  --   --  17.0* 18.3* 15.3* 16.1*  NEUTROABS 11.0*  --   --   --   --   --  13.8*  HGB 9.7*   < > 9.2* 7.8* 9.2* 8.3* 8.6*   HCT 31.8*   < > 27.0* 25.8* 30.9* 26.4* 26.9*  MCV 105.3*  --   --  106.2* 107.3* 102.3* 100.7*  PLT 229  --   --  201 206 204 198   < > = values in this interval not displayed.   Basic Metabolic Panel: Recent Labs  Lab 10/12/20 2038 10/12/20 2103 10/12/20 2119 10/13/20 0106 10/13/20 0208 10/14/20 0349 10/15/20 0337  NA 140 141 140  --  142 139 137  K 3.5 3.3* 3.3*  --  3.2* 3.5 3.7  CL 96*  --  97*  --  100 101 100  CO2 28  --   --   --  28 24 25   GLUCOSE 114*  --  112*  --  91 63* 103*  BUN 14  --  14  --  12 39* 22  CREATININE 3.13*  --  2.90* 3.27* 3.29* 4.85* 2.86*  CALCIUM 7.8*  --   --   --  7.8* 8.1* 8.3*  MG  --   --   --   --  1.9  --   --   PHOS  --   --   --   --  4.1  --   --    GFR: Estimated Creatinine Clearance: 19.5 mL/min (A) (by C-G formula based on SCr of 2.86 mg/dL (H)). Liver Function Tests: Recent Labs  Lab 10/12/20 2038  AST 40  ALT 26  ALKPHOS 643*  BILITOT 3.3*  PROT 6.1*  ALBUMIN 1.9*   No results for input(s): LIPASE, AMYLASE in the last 168 hours. No results for input(s): AMMONIA in the last 168 hours. Coagulation Profile: No results for input(s): INR, PROTIME in the last 168 hours. Cardiac Enzymes: No results for input(s): CKTOTAL, CKMB, CKMBINDEX, TROPONINI in the last 168 hours. BNP (last 3 results) No results for input(s): PROBNP in the last 8760 hours. HbA1C: Recent Labs    10/13/20 0208  HGBA1C 5.2   CBG: Recent Labs  Lab 10/14/20 1748 10/14/20 2219 10/15/20 0548 10/15/20 1130 10/15/20 1614  GLUCAP 116* 203* 141* 182* 176*   Lipid Profile: No results for input(s): CHOL, HDL, LDLCALC, TRIG, CHOLHDL, LDLDIRECT in the last 72 hours. Thyroid Function Tests: No results for input(s): TSH, T4TOTAL, FREET4, T3FREE, THYROIDAB in the last 72 hours. Anemia Panel: No results for input(s): VITAMINB12, FOLATE, FERRITIN, TIBC, IRON, RETICCTPCT in the last 72 hours. Urine analysis:    Component Value Date/Time   COLORURINE  AMBER (A) 12/31/2015 0515   APPEARANCEUR CLOUDY (A) 12/31/2015 0515   LABSPEC 1.016 12/31/2015 0515   PHURINE 7.0 12/31/2015 0515   GLUCOSEU NEGATIVE 12/31/2015 0515   HGBUR NEGATIVE 12/31/2015 0515   BILIRUBINUR SMALL (A) 12/31/2015 0515   KETONESUR 15 (A) 12/31/2015 0515   PROTEINUR 100 (A) 12/31/2015 0515   UROBILINOGEN 2.0 (H) 08/15/2013 1916   NITRITE NEGATIVE 12/31/2015 0515   LEUKOCYTESUR NEGATIVE 12/31/2015  0515   Sepsis Labs: @LABRCNTIP (procalcitonin:4,lacticidven:4)  ) Recent Results (from the past 240 hour(s))  Blood culture (routine x 2)     Status: None (Preliminary result)   Collection Time: 10/12/20 10:04 PM   Specimen: BLOOD  Result Value Ref Range Status   Specimen Description BLOOD RIGHT HAND  Final   Special Requests   Final    BOTTLES DRAWN AEROBIC AND ANAEROBIC Blood Culture results may not be optimal due to an inadequate volume of blood received in culture bottles   Culture   Final    NO GROWTH 3 DAYS Performed at [facility], [place] [address] [place], [place] [address]    Report Status PENDING  Incomplete  Resp Panel by RT-PCR (Flu A&B, Covid) Nasopharyngeal Swab     Status: None   Collection Time: 10/12/20 10:52 PM   Specimen: Nasopharyngeal Swab; Nasopharyngeal(NP) swabs in vial transport medium  Result Value Ref Range Status   SARS Coronavirus 2 by RT PCR NEGATIVE NEGATIVE Final    Comment: (NOTE) SARS-CoV-2 target nucleic acids are NOT DETECTED.  The SARS-CoV-2 RNA is generally detectable in upper respiratory specimens during the acute phase of infection. The lowest concentration of SARS-CoV-2 viral copies this assay can detect is 138 copies/mL. A negative result does not preclude SARS-Cov-2 infection and should not be used as the sole basis for treatment or other patient management decisions. A negative result may occur with  improper specimen collection/handling, submission of specimen other than nasopharyngeal swab, presence of viral  mutation(s) within the areas targeted by this assay, and inadequate number of viral copies(<138 copies/mL). A negative result must be combined with clinical observations, patient history, and epidemiological information. The expected result is Negative.  Fact Sheet for Patients:  EntrepreneurPulse.com.au  Fact Sheet for Healthcare Providers:  IncredibleEmployment.be  This test is no t yet approved or cleared by the [place] FDA and  has been authorized for detection and/or diagnosis of SARS-CoV-2 by FDA under an Emergency Use Authorization (EUA). This EUA will remain  in effect (meaning this test can be used) for the duration of the COVID-19 declaration under Section 564(b)(1) of the Act, 21 U.S.C.section 360bbb-3(b)(1), unless the authorization is terminated  or revoked sooner.       Influenza A by PCR NEGATIVE NEGATIVE Final   Influenza B by PCR NEGATIVE NEGATIVE Final    Comment: (NOTE) The Xpert Xpress SARS-CoV-2/FLU/RSV plus assay is intended as an aid in the diagnosis of influenza from Nasopharyngeal swab specimens and should not be used as a sole basis for treatment. Nasal washings and aspirates are unacceptable for Xpert Xpress SARS-CoV-2/FLU/RSV testing.  Fact Sheet for Patients: EntrepreneurPulse.com.au  Fact Sheet for Healthcare Providers: IncredibleEmployment.be  This test is not yet approved or cleared by the [place] FDA and has been authorized for detection and/or diagnosis of SARS-CoV-2 by FDA under an Emergency Use Authorization (EUA). This EUA will remain in effect (meaning this test can be used) for the duration of the COVID-19 declaration under Section 564(b)(1) of the Act, 21 U.S.C. section 360bbb-3(b)(1), unless the authorization is terminated or revoked.  Performed at [facility], [place] [address] [place], [place] [address]   Blood culture (routine x 2)      Status: None (Preliminary result)   Collection Time: 10/13/20 12:02 AM   Specimen: BLOOD  Result Value Ref Range Status   Specimen Description BLOOD SITE NOT SPECIFIED  Final   Special Requests   Final    BOTTLES  DRAWN AEROBIC AND ANAEROBIC Blood Culture adequate volume   Culture   Final    NO GROWTH 2 DAYS Performed at [facility], [place] [address], [place], [place] [address]    Report Status PENDING  Incomplete  MRSA PCR Screening     Status: None   Collection Time: 10/13/20  1:58 AM   Specimen: Nasopharyngeal  Result Value Ref Range Status   MRSA by PCR NEGATIVE NEGATIVE Final    Comment:        The GeneXpert MRSA Assay (FDA approved for NASAL specimens only), is one component of a comprehensive MRSA colonization surveillance program. It is not intended to diagnose MRSA infection nor to guide or monitor treatment for MRSA infections. Performed at [facility], [place] [address] [place], [place] [address]       Studies: No results found.  Scheduled Meds: . Chlorhexidine Gluconate Cloth  6 each Topical Daily  . cycloSPORINE modified  100 mg Oral BID   And  . cycloSPORINE modified  25 mg Oral BID  . dorzolamide-timolol  1 drop Both Eyes BID  . heparin  5,000 Units Subcutaneous Q8H  . insulin aspart  0-5 Units Subcutaneous QHS  . insulin aspart  0-9 Units Subcutaneous TID WC  . mouth rinse  15 mL Mouth Rinse BID  . midodrine  10 mg Oral q12n4p  . pantoprazole (PROTONIX) IV  40 mg Intravenous Daily  . [START ON 10/16/2020] predniSONE  40 mg Oral Q breakfast  . sulfamethoxazole-trimethoprim  1 tablet Oral Once per day on Mon Wed Fri  . valGANciclovir  450 mg Oral Once per day on Mon Wed Fri    Continuous Infusions: . piperacillin-tazobactam (ZOSYN)  IV 2.25 g (10/15/20 1511)     LOS: 2 days     Alma Friendly, MD [facility]  If 7PM-7AM, please contact night-coverage [email or web address] 10/15/2020, 6:56 PM

## 2020-10-15 NOTE — TOC Initial Note (Signed)
Transition of Care [facility]) - Initial/Assessment Note    Patient Details  Name: [name] MRN: [id number] Date of Birth: [birth year]  Transition of [facility]) CM/SW Contact:    Angelita Ingles, RN Phone Number: [phone]  10/15/2020, 4:08 PM  Clinical Narrative:                 [facility] consulted for disposition needs. Patient is from home where he currently receives [facility] services through [facility]. Per wife patient has DME (wheelchair, cane, walker, BSC & tub bench) Wife is agreeable that she wants [facility] to resume services upon discharge. Patient has PCP and is a transplant recipient so he does follow closely with his transplant team at [facility] TOC will continue to follow for any needs.    Expected Discharge Plan: [place] Barriers to Discharge: Continued Medical Work up   Patient Goals and CMS Choice        Expected Discharge Plan and Services Expected Discharge Plan: [place] In-house Referral: NA Discharge Planning Services: CM Consult Post Acute Care Choice: [place] arrangements for the past 2 months: Single Family Home                 DME Arranged: N/A         HH Arranged: NA [place] Agency: NA        Prior Living Arrangements/Services Living arrangements for the past 2 months: Single Family Home Lives with:: Spouse Patient language and need for interpreter reviewed:: Yes Do you feel safe going back to the place where you live?: Yes      Need for Family Participation in Patient Care: Yes (Comment) Care giver support system in place?: Yes (comment) Current home services: Home PT Criminal Activity/Legal Involvement Pertinent to Current Situation/Hospitalization: No - Comment as needed  Activities of Daily Living      Permission Sought/Granted Permission sought to share information with : Case Manager,Family Supports Permission granted to share information with : Yes, Verbal Permission Granted  Share  Information with NAME: Molli Posey     Permission granted to share info w Relationship: Wife     Emotional Assessment   Attitude/Demeanor/Rapport: Unable to Assess Affect (typically observed): Unable to Assess   Alcohol / Substance Use: Not Applicable Psych Involvement: No (comment)  Admission diagnosis:  Lactic acidosis [E87.2] Shock ([place]) [R57.9] Septic shock ([place]) [A41.9, R65.21] Leukocytosis, unspecified type [D72.829] Patient Active Problem List   Diagnosis Date Noted  . Anemia 09/26/2020  . Exercise hypoxemia   . Pneumonia 07/17/2020  . Lung mass 07/17/2020  . Acute bronchiolitis due to human metapneumovirus 04/25/2020  . Sepsis with acute organ dysfunction ([place]) 01/08/2020  . Lactic acidosis 01/08/2020  . Mixed diabetic hyperlipidemia associated with type 2 diabetes mellitus ([place]) 01/08/2020  . Hepatic cirrhosis ([place]) 01/08/2020  . Uncontrolled type 2 diabetes mellitus with hyperglycemia, with long-term current use of insulin ([place]) 01/08/2020  . IV infiltrate, initial encounter 01/08/2020  . Acute diarrhea 01/08/2020  . Acute metabolic encephalopathy 82/50/5397  . 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 AV fistula infection, initial encounter ([place]) 01/14/2019  . Immunocompromised state ([place]) 01/14/2019  . Wound infection 01/13/2019  . Sepsis ([place])   . CKD (chronic kidney disease)   . Generalized weakness 12/30/2015  . ESRD (end stage renal disease) ([place])   . 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[place])   . Acute respiratory failure with hypoxemia ([place]) 04/18/2015  . Septic shock ([place]) 04/18/2015  . Acute encephalopathy  04/18/2015  . Acute respiratory failure with hypoxia ([place]) 04/18/2015  . Cardiac arrest ([place])   . HCAP (healthcare-associated pneumonia)   . Elevated rheumatoid factor 05/09/2014  . Essential hypertension 05/09/2014  . ILD (interstitial lung disease) ([place]) 05/09/2014  . Obstructive apnea 05/09/2014  . Lung nodule, solitary 04/18/2014  . Awaiting organ  transplant 04/18/2014  . Acute on chronic respiratory failure with hypoxia ([place]) 08/15/2013  . Edema 07/05/2013  . Chronic respiratory failure ([place]) 03/03/2013  . Diabetes mellitus with complication ([place]) 26/71/2458  . Acute sinusitis 05/04/2011  . Cough 11/10/2010  . 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 Obstructive sleep apnea 10/09/2008  . GERD without esophagitis 10/09/2008  . HIATAL HERNIA 10/09/2008  . OSTEOARTHRITIS 10/09/2008   PCP:  Wenda Low, MD Pharmacy:   [place], [place] [address] Phone: [phone] Fax: [phone]  [facility] [phone] [place], [place] [address] Phone: [phone] Fax: [place], [place] - [place] [address] [place] [address] Phone: [phone] Fax: [phone]  [place], [place] [address] Phone: [phone] Fax: [phone]     Social Determinants of Health (SDOH) Interventions    Readmission Risk Interventions Readmission Risk Prevention Plan 10/15/2020  Transportation Screening Complete  Medication Review (RN Care Manager) Complete  PCP or Specialist appointment within 3-5 days of discharge Complete  HRI or Home Care Consult Complete  SW Recovery Care/Counseling Consult Complete  Palliative Care Screening Not Applicable  Skilled Nursing Facility Complete  Some recent data might be hidden

## 2020-10-15 NOTE — Progress Notes (Addendum)
[facility] Progress Note   Subjective:  Completed dialysis yesterday -net UF 2L -no issues Feels good this am. Denies cp, sob.  Working with PT this am   Objective Vitals:   10/14/20 1834 10/14/20 2130 10/15/20 0611 10/15/20 0916  BP: 121/80 (!) 154/75 140/73 130/66  Pulse:  77 69 77  Resp: 17 18 18 18   Temp: 98 F (36.7 C) 98.2 F (36.8 C) 97.6 F (36.4 C) 98 F (36.7 C)  TempSrc:  Oral Oral   SpO2: 98% 98% 98% 100%  Weight:      Height:         Additional Objective Labs: Basic Metabolic Panel: Recent Labs  Lab 10/13/20 0208 10/14/20 0349 10/15/20 0337  NA 142 139 137  K 3.2* 3.5 3.7  CL 100 101 100  CO2 28 24 25   GLUCOSE 91 63* 103*  BUN 12 39* 22  CREATININE 3.29* 4.85* 2.86*  CALCIUM 7.8* 8.1* 8.3*  PHOS 4.1  --   --    CBC: Recent Labs  Lab 10/12/20 2038 10/12/20 2103 10/13/20 0106 10/13/20 0208 10/14/20 0349 10/15/20 0337  WBC 14.8*  --  17.0* 18.3* 15.3* 16.1*  NEUTROABS 11.0*  --   --   --   --  13.8*  HGB 9.7*   < > 7.8* 9.2* 8.3* 8.6*  HCT 31.8*   < > 25.8* 30.9* 26.4* 26.9*  MCV 105.3*  --  106.2* 107.3* 102.3* 100.7*  PLT 229  --  201 206 204 198   < > = values in this interval not displayed.   Blood Culture    Component Value Date/Time   SDES BLOOD SITE NOT SPECIFIED 10/13/2020 0002   SPECREQUEST  10/13/2020 0002    BOTTLES DRAWN AEROBIC AND ANAEROBIC Blood Culture adequate volume   CULT  10/13/2020 0002    NO GROWTH 2 DAYS Performed at [facility], [place] [address], [place], [place] [address]    REPTSTATUS PENDING 10/13/2020 0002     Physical Exam General: Frail appearing, nad  Heart: RRR  Lungs: Distant; occasional rhonchi  Abdomen: soft non -tender  Extremities: no LE edema  Dialysis Access: LUE AVF +bruit   Medications: . piperacillin-tazobactam (ZOSYN)  IV 2.25 g (10/15/20 0618)   . Chlorhexidine Gluconate Cloth  6 each Topical Daily  . cycloSPORINE modified  100 mg Oral BID   And  .  cycloSPORINE modified  25 mg Oral BID  . dorzolamide-timolol  1 drop Both Eyes BID  . heparin  5,000 Units Subcutaneous Q8H  . insulin aspart  0-5 Units Subcutaneous QHS  . insulin aspart  0-9 Units Subcutaneous TID WC  . mouth rinse  15 mL Mouth Rinse BID  . midodrine  10 mg Oral q12n4p  . pantoprazole (PROTONIX) IV  40 mg Intravenous Daily  . [START ON 10/16/2020] predniSONE  40 mg Oral Q breakfast  . sulfamethoxazole-trimethoprim  1 tablet Oral Once per day on Mon Wed Fri  . valGANciclovir  450 mg Oral Once per day on Mon Wed Fri    Dialysis Orders:  [facility] MWF 4h 400/800  EDW 58kg 2K/2Ca   Assessment/Plan: 1. Sepsis w/u - Hypotensive with leukocytosis on admission. Blood cultures ngtd. On Zosyn -per primary  2. ESRD -  HD MWF. Continue on schedule. Next HD 5/13.  3. Hypotension/volume  - BP improved.  Hx of hypotension -on midodrine.  Has not been reaching dry weight as outpatient. Follow weights here. Close to EDW post  wt 59.2kg  4. Anemia  - Hgb 8.6. ESA held at dialysis center d/t new dx lung ca. Transfuse prn. Hx GI bleed. Hold heparin  5. Metabolic bone disease -  Corr Ca/Phos ok. Follow trends.  6. Nutrition - Renal/carb modified diet. Prot supp. Still has PEG tube from prior admission. Not using 7. S/p lung transplant --on IS -per primary 8. LUL mass/Stage 2B NSCLC   Ogechi Larina Earthly PA-C [facility] 10/15/2020,11:35 AM   Seen and examined independently.  Agree with note and exam as documented above by physician extender and as noted here.  Felt well this am "pretty good" and states that HD went well.  No dizziness or cramping.   General adult male in bed in no acute distress HEENT normocephalic atraumatic extraocular movements intact sclera anicteric Neck supple trachea midline Lungs left basilar crackles; normal work of breathing at rest; on 2.5 liters oxygen  Heart S1S2 no rub Abdomen soft nontender nondistended Extremities no edema, cyanosis or  clubbing Psych normal mood and affect Neuro - alert and oriented x 3 provides hx and follows commands  Access: left AVF with bruit   Sepsis - blood cultures NGTD. Abx per primary.    ESRD - on HD MWF  Anemia CKD - no ESA given setting of malignancy  Chronic hypotension on midodrine - acute worsening on admit. Improved.   Lung transplant - meds per primary team.  Appears may be chronically on pred   Claudia Desanctis, MD 10/15/2020  11:56 AM

## 2020-10-16 LAB — CBC WITH DIFFERENTIAL/PLATELET
Abs Immature Granulocytes: 1.41 10*3/uL — ABNORMAL HIGH (ref 0.00–0.07)
Basophils Absolute: 0 10*3/uL (ref 0.0–0.1)
Basophils Relative: 0 %
Eosinophils Absolute: 0 10*3/uL (ref 0.0–0.5)
Eosinophils Relative: 0 %
HCT: 29.5 % — ABNORMAL LOW (ref 39.0–52.0)
Hemoglobin: 9.3 g/dL — ABNORMAL LOW (ref 13.0–17.0)
Immature Granulocytes: 9 %
Lymphocytes Relative: 4 %
Lymphs Abs: 0.6 10*3/uL — ABNORMAL LOW (ref 0.7–4.0)
MCH: 31.8 pg (ref 26.0–34.0)
MCHC: 31.5 g/dL (ref 30.0–36.0)
MCV: 101 fL — ABNORMAL HIGH (ref 80.0–100.0)
Monocytes Absolute: 1.5 10*3/uL — ABNORMAL HIGH (ref 0.1–1.0)
Monocytes Relative: 10 %
Neutro Abs: 11.9 10*3/uL — ABNORMAL HIGH (ref 1.7–7.7)
Neutrophils Relative %: 77 %
Platelets: 201 10*3/uL (ref 150–400)
RBC: 2.92 MIL/uL — ABNORMAL LOW (ref 4.22–5.81)
RDW: 19.5 % — ABNORMAL HIGH (ref 11.5–15.5)
WBC: 15.7 10*3/uL — ABNORMAL HIGH (ref 4.0–10.5)
nRBC: 0 % (ref 0.0–0.2)

## 2020-10-16 LAB — BASIC METABOLIC PANEL
Anion gap: 15 (ref 5–15)
BUN: 43 mg/dL — ABNORMAL HIGH (ref 8–23)
CO2: 22 mmol/L (ref 22–32)
Calcium: 8.3 mg/dL — ABNORMAL LOW (ref 8.9–10.3)
Chloride: 99 mmol/L (ref 98–111)
Creatinine, Ser: 4.67 mg/dL — ABNORMAL HIGH (ref 0.61–1.24)
GFR, Estimated: 13 mL/min — ABNORMAL LOW (ref >60)
Glucose, Bld: 117 mg/dL — ABNORMAL HIGH (ref 70–99)
Potassium: 3.2 mmol/L — ABNORMAL LOW (ref 3.5–5.1)
Sodium: 136 mmol/L (ref 135–145)

## 2020-10-16 LAB — GLUCOSE, CAPILLARY
Glucose-Capillary: 122 mg/dL — ABNORMAL HIGH (ref 70–99)
Glucose-Capillary: 129 mg/dL — ABNORMAL HIGH (ref 70–99)
Glucose-Capillary: 190 mg/dL — ABNORMAL HIGH (ref 70–99)
Glucose-Capillary: 156 mg/dL — ABNORMAL HIGH (ref 70–99)

## 2020-10-16 LAB — HEPATITIS B SURFACE ANTIGEN: Hepatitis B Surface Ag: NONREACTIVE

## 2020-10-16 MED ORDER — MIDODRINE HCL 5 MG PO TABS
ORAL_TABLET | ORAL | Status: AC
  Filled 2020-10-16: qty 2

## 2020-10-16 MED ORDER — SODIUM CHLORIDE 0.9 % IV SOLN: 100.0000 mL | INTRAVENOUS | Status: DC | PRN

## 2020-10-16 MED ORDER — ACETAMINOPHEN 325 MG PO TABS
650.0000 mg | ORAL_TABLET | Freq: Once | ORAL | Status: AC
  Administered 2020-10-16: 650 mg via ORAL

## 2020-10-16 MED ORDER — LIDOCAINE-PRILOCAINE 2.5-2.5 % EX CREA: 1.0000 "application " | TOPICAL_CREAM | CUTANEOUS | Status: DC | PRN

## 2020-10-16 MED ORDER — ACETAMINOPHEN 325 MG PO TABS
ORAL_TABLET | ORAL | Status: AC
  Filled 2020-10-16: qty 2

## 2020-10-16 MED ORDER — HEPARIN SODIUM (PORCINE) 1000 UNIT/ML DIALYSIS: 1000.0000 [IU] | INTRAMUSCULAR | Status: DC | PRN

## 2020-10-16 MED ORDER — LIDOCAINE HCL (PF) 1 % IJ SOLN: 5.0000 mL | INTRAMUSCULAR | Status: DC | PRN

## 2020-10-16 MED ORDER — PENTAFLUOROPROP-TETRAFLUOROETH EX AERO: 1.0000 "application " | INHALATION_SPRAY | CUTANEOUS | Status: DC | PRN

## 2020-10-16 NOTE — Progress Notes (Signed)
PT Cancellation Note  Patient Details Name: HAMILTON MARINELLO MRN: [id number] DOB: 01/30/1951   Cancelled Treatment:    Reason Eval/Treat Not Completed: (P) Patient at procedure or test/unavailable Pt is currently off floor for HD. PT will follow back for treatment this afternoon as able.   Chloee Tena B. Migdalia Dk PT, DPT Acute Rehabilitation Services Pager [phone] Office [phone]    [place] 10/16/2020, 8:23 AM

## 2020-10-16 NOTE — Plan of Care (Signed)
  Problem: Education: Goal: Knowledge of General Education information will improve Description: Including pain rating scale, medication(s)/side effects and non-pharmacologic comfort measures Outcome: Progressing   Problem: Health Behavior/Discharge Planning: Goal: Ability to manage health-related needs will improve Outcome: Progressing   Problem: Nutrition: Goal: Adequate nutrition will be maintained Outcome: Progressing   

## 2020-10-16 NOTE — Plan of Care (Signed)

## 2020-10-16 NOTE — Progress Notes (Signed)
[name] Progress Note   Subjective:     [name] was seen and examined during HD treatment today. Patient currently c/o pain a L AVF site, causing him lack of sleep overnight. Currently tolerating HD. Arterial/Venous pressures appear stable. Spoke with HD RN who reports no difficulty with cannulation. No signs of swelling, erythema, or drainage at this time. Reviewed OP HD records-patient previously referred to [facility] outpatient for L arm swelling and difficulty with cannulation. Currently on O2. Denies SOB, CP, ABD, and N/V/D.   Objective Vitals:   10/16/20 0833 10/16/20 0900 10/16/20 0929 10/16/20 1000  BP: (!) 153/81 (!) 141/79 126/70 129/76  Pulse: 83 84 86 84  Resp: 15     Temp:      TempSrc:      SpO2:      Weight:      Height:       Physical Exam General: Frail appearing older male; no acute respiratory distress Heart: Normal S1 and S2; No murmurs, gallops, or friction rub Lungs: Clear anteriorly; diminished bilaterally; No wheezing or rales Abdomen: Soft, non-tender Extremities: No edema bilateral lower extremities Dialysis Access: L AVF (+) Bruit/Thrill  Filed Weights   10/14/20 1240 10/14/20 1644 10/16/20 0826  Weight: 62.3 kg 59.2 kg 62.3 kg    Intake/Output Summary (Last 24 hours) at 10/16/2020 1018 Last data filed at 10/15/2020 1834 Gross per 24 hour  Intake 420 ml  Output --  Net 420 ml    Additional Objective Labs: Basic Metabolic Panel: Recent Labs  Lab 10/13/20 0208 10/14/20 0349 10/15/20 0337 10/16/20 0333  NA 142 139 137 136  K 3.2* 3.5 3.7 3.2*  CL 100 101 100 99  CO2 28 24 25 22   GLUCOSE 91 63* 103* 117*  BUN 12 39* 22 43*  CREATININE 3.29* 4.85* 2.86* 4.67*  CALCIUM 7.8* 8.1* 8.3* 8.3*  PHOS 4.1  --   --   --    Liver Function Tests: Recent Labs  Lab 10/12/20 2038  AST 40  ALT 26  ALKPHOS 643*  BILITOT 3.3*  PROT 6.1*  ALBUMIN 1.9*   No results for input(s): LIPASE, AMYLASE in the last 168  hours. CBC: Recent Labs  Lab 10/12/20 2038 10/12/20 2103 10/13/20 0106 10/13/20 0208 10/14/20 0349 10/15/20 0337 10/16/20 0333  WBC 14.8*  --  17.0* 18.3* 15.3* 16.1* 15.7*  NEUTROABS 11.0*  --   --   --   --  13.8* 11.9*  HGB 9.7*   < > 7.8* 9.2* 8.3* 8.6* 9.3*  HCT 31.8*   < > 25.8* 30.9* 26.4* 26.9* 29.5*  MCV 105.3*  --  106.2* 107.3* 102.3* 100.7* 101.0*  PLT 229  --  201 206 204 198 201   < > = values in this interval not displayed.   Blood Culture    Component Value Date/Time   SDES BLOOD SITE NOT SPECIFIED 10/13/2020 0002   SPECREQUEST  10/13/2020 0002    BOTTLES DRAWN AEROBIC AND ANAEROBIC Blood Culture adequate volume   CULT  10/13/2020 0002    NO GROWTH 2 DAYS Performed at [facility], [place] [address], [place], [place] [address]    REPTSTATUS PENDING 10/13/2020 0002   CBG: Recent Labs  Lab 10/15/20 0548 10/15/20 1130 10/15/20 1614 10/15/20 2007 10/16/20 0749  GLUCAP 141* 182* 176* 81 122*   Iron Studies: No results for input(s): IRON, TIBC, TRANSFERRIN, FERRITIN in the last 72 hours. Lab Results  Component Value Date   INR  1.0 09/26/2020   INR 1.1 04/25/2020   INR 1.0 04/24/2020   Medications: . [START ON 10/17/2020] sodium chloride    . [START ON 10/17/2020] sodium chloride    . piperacillin-tazobactam (ZOSYN)  IV 2.25 g (10/16/20 0515)   . Chlorhexidine Gluconate Cloth  6 each Topical Daily  . cycloSPORINE modified  100 mg Oral BID   And  . cycloSPORINE modified  25 mg Oral BID  . dorzolamide-timolol  1 drop Both Eyes BID  . heparin  5,000 Units Subcutaneous Q8H  . insulin aspart  0-5 Units Subcutaneous QHS  . insulin aspart  0-9 Units Subcutaneous TID WC  . mouth rinse  15 mL Mouth Rinse BID  . midodrine  10 mg Oral q12n4p  . pantoprazole (PROTONIX) IV  40 mg Intravenous Daily  . predniSONE  40 mg Oral Q breakfast  . sulfamethoxazole-trimethoprim  1 tablet Oral Once per day on Mon Wed Fri  . valGANciclovir  450 mg Oral Once per day  on Mon Wed Fri    Dialysis Orders: [facility] MWF 4h 400/800 EDW 58kg 2K/2Ca   Assessment/Plan: 1. Sepsis w/u - Hypotensive with leukocytosis on admission. Blood cultures ngtd. On Zosyn -per primary 2. ESRD -HD MWF. On HD today-tolerating well-UF as tolerated. C/o pain L AVF-previously referred to [facility] for L arm swelling and difficulty cannulation. No swelling, erythema, or difficulty cannulation at this time. May order fistulogram while inpatient-I will discuss with Dr. Jonnie Finner today. 3. Hypotension/volume - Blood pressures currently ranging sys 100-140s. Hx of hypotension -on midodrine.  Has not been reaching dry weight as outpatient. Follow weights here.Over EDW this morning-will evaluate post weight. 4. Anemia - Hgb 9.3 . ESA held at dialysis center d/t new dx lung ca. Transfuse prn. Hx GI bleed. Hold heparin 5. Metabolic bone disease -Corr Ca/Phos ok. Follow trends. 6. Nutrition -Renal/carb modified diet. Prot supp. Still has PEG tube from prior admission. Not using 7. S/p lung transplant --on IS -per primary 8. LUL mass/Stage 2B NSCLC  Tobie Poet, NP [facility] 10/16/2020,10:18 AM  LOS: 3 days

## 2020-10-16 NOTE — Progress Notes (Signed)
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  Patient reported that after dialysis he noted pain in L arm was getting worse, with some generalized fatigue.  On arrival to the ED, he was found to be hypotensive.  Reports subjective fever.  Patient denies any chest pain/chest pressure/shortness of breath/chills/sore throat/headache/dizziness, nausea or vomiting.  No pain in the lower extremities.  Of note, patient required hospitalization in [facility] in April 2022 for respiratory failure, diagnosis and treatment of lung cancer with radiation and subsequent GJ tube placement. PCCM admitted, was placed on Levophed, subsequently weaned off. [facility] hospitalist assumed care on 10/14/2020.    Today, patient complaining of pain around his left AV fistula, denies any other new complaints.  During HD, no difficulty with cannulation was noted, no signs of swelling, erythema noted.  She denies any chest pain, shortness of breath, abdominal pain, nausea/vomiting, fever/chills.      Assessment/Plan: Active Problems:   Septic shock (HCC)   Likely hypovolemic shock s/p HD- BP improved History of chronic hypotension-s/p IVF and levophed, weaned off Rule out sepsis Currently afebrile, with leukocytosis (on steroids) BC x2 NGTD Chest x-ray unremarkable Continue midodrine Continue Zosyn for total of 5 days Plan to taper stress dose steroids  Pulmonary fibrosis s/p single lung transplant in 2016 on immunosuppressants Currently on stress dose steroids, plan to taper (5 mg prednisone daily at home) Continue cyclosporine, valganciclovir, Bactrim  ESRD Hypokalemia HD per nephrology  M/W/F  Anemia of chronic kidney disease Hemoglobin around baseline Daily CBC  Diabetes mellitus type 2 Continue SSI, Accu-Cheks, hypoglycemic protocol  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   Weight as of this encounter: 60.3 kg.     Code Status: Full  Family Communication: None at bedside  Disposition Plan: Status is: Inpatient  Remains inpatient appropriate because:Inpatient level of care appropriate due to severity of illness   Dispo: The patient is from: Home              Anticipated d/c is to: Home              Patient currently is not medically stable to d/c.   Difficult to place patient No    Consultants:  PCCM  Procedures:  None  Antimicrobials:  Zosyn  DVT prophylaxis: Heparin   Objective: Vitals:   10/16/20 1215 10/16/20 1230 10/16/20 1233 10/16/20 1339  BP: 113/65 (!) 100/58 133/70 134/72  Pulse: 79 78 77 78  Resp:  15 15 16   Temp:   98.3 F (36.8 C) 98.4 F (36.9 C)  TempSrc:   Oral Oral  SpO2:   100% 100%  Weight:   60.3 kg   Height:        Intake/Output Summary (Last 24 hours) at 10/16/2020 1355 Last data filed at 10/16/2020 1233 Gross per 24 hour  Intake 180 ml  Output 1832 ml  Net -1652 ml   Filed Weights   10/14/20 1644 10/16/20 8916  10/16/20 1233  Weight: 59.2 kg 62.3 kg 60.3 kg    Exam:  General: NAD   Cardiovascular: S1, S2 present  Respiratory: CTAB  Abdomen: Soft, nontender, nondistended, bowel sounds present, GJ tube noted  Musculoskeletal: No bilateral pedal edema noted, LUE fistula noted, no erythema or swelling noted  Skin: Normal  Psychiatry: Normal mood    Data Reviewed: CBC: Recent Labs  Lab 10/12/20 2038 10/12/20 2103 10/13/20 0106 10/13/20 0208 10/14/20 0349 10/15/20 0337  10/16/20 0333  WBC 14.8*  --  17.0* 18.3* 15.3* 16.1* 15.7*  NEUTROABS 11.0*  --   --   --   --  13.8* 11.9*  HGB 9.7*   < > 7.8* 9.2* 8.3* 8.6* 9.3*  HCT 31.8*   < > 25.8* 30.9* 26.4* 26.9* 29.5*  MCV 105.3*  --  106.2* 107.3* 102.3* 100.7* 101.0*  PLT 229  --  201 206 204 198 201   < > = values in this interval not displayed.   Basic Metabolic Panel: Recent Labs  Lab 10/12/20 2038 10/12/20 2103 10/12/20 2119 10/13/20 0106 10/13/20 0208 10/14/20 0349 10/15/20 0337 10/16/20 0333  NA 140   < > 140  --  142 139 137 136  K 3.5   < > 3.3*  --  3.2* 3.5 3.7 3.2*  CL 96*  --  97*  --  100 101 100 99  CO2 28  --   --   --  28 24 25 22   GLUCOSE 114*  --  112*  --  91 63* 103* 117*  BUN 14  --  14  --  12 39* 22 43*  CREATININE 3.13*  --  2.90* 3.27* 3.29* 4.85* 2.86* 4.67*  CALCIUM 7.8*  --   --   --  7.8* 8.1* 8.3* 8.3*  MG  --   --   --   --  1.9  --   --   --   PHOS  --   --   --   --  4.1  --   --   --    < > = values in this interval not displayed.   GFR: Estimated Creatinine Clearance: 11.9 mL/min (A) (by C-G formula based on SCr of 4.67 mg/dL (H)). Liver Function Tests: Recent Labs  Lab 10/12/20 2038  AST 40  ALT 26  ALKPHOS 643*  BILITOT 3.3*  PROT 6.1*  ALBUMIN 1.9*   No results for input(s): LIPASE, AMYLASE in the last 168 hours. No results for input(s): AMMONIA in the last 168 hours. Coagulation Profile: No results for input(s): INR, PROTIME in the last 168 hours. Cardiac Enzymes: No results for input(s): CKTOTAL, CKMB, CKMBINDEX, TROPONINI in the last 168 hours. BNP (last 3 results) No results for input(s): PROBNP in the last 8760 hours. HbA1C: No results for input(s): HGBA1C in the last 72 hours. CBG: Recent Labs  Lab 10/15/20 1130 10/15/20 1614 10/15/20 2007 10/16/20 0749 10/16/20 1338  GLUCAP 182* 176* 81 122* 129*   Lipid Profile: No results for input(s): CHOL, HDL, LDLCALC, TRIG, CHOLHDL, LDLDIRECT in the last 72 hours. Thyroid Function  Tests: No results for input(s): TSH, T4TOTAL, FREET4, T3FREE, THYROIDAB in the last 72 hours. Anemia Panel: No results for input(s): VITAMINB12, FOLATE, FERRITIN, TIBC, IRON, RETICCTPCT in the last 72 hours. Urine analysis:    Component Value Date/Time   COLORURINE AMBER (A) 12/31/2015 0515   APPEARANCEUR CLOUDY (A) 12/31/2015 0515   LABSPEC 1.016 12/31/2015 0515   PHURINE 7.0 12/31/2015  0515   GLUCOSEU NEGATIVE 12/31/2015 0515   HGBUR NEGATIVE 12/31/2015 0515   BILIRUBINUR SMALL (A) 12/31/2015 0515   KETONESUR 15 (A) 12/31/2015 0515   PROTEINUR 100 (A) 12/31/2015 0515   UROBILINOGEN 2.0 (H) 08/15/2013 1916   NITRITE NEGATIVE 12/31/2015 0515   LEUKOCYTESUR NEGATIVE 12/31/2015 0515   Sepsis Labs: @LABRCNTIP (procalcitonin:4,lacticidven:4)  ) Recent Results (from the past 240 hour(s))  Blood culture (routine x 2)     Status: None (Preliminary result)   Collection Time: 10/12/20 10:04 PM   Specimen: BLOOD  Result Value Ref Range Status   Specimen Description BLOOD RIGHT HAND  Final   Special Requests   Final    BOTTLES DRAWN AEROBIC AND ANAEROBIC Blood Culture results may not be optimal due to an inadequate volume of blood received in culture bottles   Culture   Final    NO GROWTH 3 DAYS Performed at [facility], [place] [address], [place], [place] [address]    Report Status PENDING  Incomplete  Resp Panel by RT-PCR (Flu A&B, Covid) Nasopharyngeal Swab     Status: None   Collection Time: 10/12/20 10:52 PM   Specimen: Nasopharyngeal Swab; Nasopharyngeal(NP) swabs in vial transport medium  Result Value Ref Range Status   SARS Coronavirus 2 by RT PCR NEGATIVE NEGATIVE Final    Comment: (NOTE) SARS-CoV-2 target nucleic acids are NOT DETECTED.  The SARS-CoV-2 RNA is generally detectable in upper respiratory specimens during the acute phase of infection. The lowest concentration of SARS-CoV-2 viral copies this assay can detect is 138 copies/mL. A negative result does not  preclude SARS-Cov-2 infection and should not be used as the sole basis for treatment or other patient management decisions. A negative result may occur with  improper specimen collection/handling, submission of specimen other than nasopharyngeal swab, presence of viral mutation(s) within the areas targeted by this assay, and inadequate number of viral copies(<138 copies/mL). A negative result must be combined with clinical observations, patient history, and epidemiological information. The expected result is Negative.  Fact Sheet for Patients:  EntrepreneurPulse.com.au  Fact Sheet for Healthcare Providers:  IncredibleEmployment.be  This test is no t yet approved or cleared by the [place] FDA and  has been authorized for detection and/or diagnosis of SARS-CoV-2 by FDA under an Emergency Use Authorization (EUA). This EUA will remain  in effect (meaning this test can be used) for the duration of the COVID-19 declaration under Section 564(b)(1) of the Act, 21 U.S.C.section 360bbb-3(b)(1), unless the authorization is terminated  or revoked sooner.       Influenza A by PCR NEGATIVE NEGATIVE Final   Influenza B by PCR NEGATIVE NEGATIVE Final    Comment: (NOTE) The Xpert Xpress SARS-CoV-2/FLU/RSV plus assay is intended as an aid in the diagnosis of influenza from Nasopharyngeal swab specimens and should not be used as a sole basis for treatment. Nasal washings and aspirates are unacceptable for Xpert Xpress SARS-CoV-2/FLU/RSV testing.  Fact Sheet for Patients: EntrepreneurPulse.com.au  Fact Sheet for Healthcare Providers: IncredibleEmployment.be  This test is not yet approved or cleared by the [place] FDA and has been authorized for detection and/or diagnosis of SARS-CoV-2 by FDA under an Emergency Use Authorization (EUA). This EUA will remain in effect (meaning this test can be used) for the  duration of the COVID-19 declaration under Section 564(b)(1) of the Act, 21 U.S.C. section 360bbb-3(b)(1), unless the authorization is terminated or revoked.  Performed at [facility], [place] [address], [place], [place] [address]  Blood culture (routine x 2)     Status: None (Preliminary result)   Collection Time: 10/13/20 12:02 AM   Specimen: BLOOD  Result Value Ref Range Status   Specimen Description BLOOD SITE NOT SPECIFIED  Final   Special Requests   Final    BOTTLES DRAWN AEROBIC AND ANAEROBIC Blood Culture adequate volume   Culture   Final    NO GROWTH 2 DAYS Performed at [facility], [address] [place], [place] [address]    Report Status PENDING  Incomplete  MRSA PCR Screening     Status: None   Collection Time: 10/13/20  1:58 AM   Specimen: Nasopharyngeal  Result Value Ref Range Status   MRSA by PCR NEGATIVE NEGATIVE Final    Comment:        The GeneXpert MRSA Assay (FDA approved for NASAL specimens only), is one component of a comprehensive MRSA colonization surveillance program. It is not intended to diagnose MRSA infection nor to guide or monitor treatment for MRSA infections. Performed at [facility], [place] [address], [place], [place] [address]       Studies: No results found.  Scheduled Meds: . Chlorhexidine Gluconate Cloth  6 each Topical Daily  . cycloSPORINE modified  100 mg Oral BID   And  . cycloSPORINE modified  25 mg Oral BID  . dorzolamide-timolol  1 drop Both Eyes BID  . heparin  5,000 Units Subcutaneous Q8H  . insulin aspart  0-5 Units Subcutaneous QHS  . insulin aspart  0-9 Units Subcutaneous TID WC  . mouth rinse  15 mL Mouth Rinse BID  . midodrine  10 mg Oral q12n4p  . pantoprazole (PROTONIX) IV  40 mg Intravenous Daily  . predniSONE  40 mg Oral Q breakfast  . sulfamethoxazole-trimethoprim  1 tablet Oral Once per day on Mon Wed Fri  . valGANciclovir  450 mg Oral Once per day on Mon Wed Fri    Continuous  Infusions: . piperacillin-tazobactam (ZOSYN)  IV 2.25 g (10/16/20 0515)     LOS: 3 days     Alma Friendly, MD [facility]  If 7PM-7AM, please contact night-coverage [email or web address] 10/16/2020, 1:55 PM

## 2020-10-17 DIAGNOSIS — T82898A Other specified complication of vascular prosthetic devices, implants and grafts, initial encounter: Secondary | ICD-10-CM

## 2020-10-17 DIAGNOSIS — N185 Chronic kidney disease, stage 5: Secondary | ICD-10-CM

## 2020-10-17 LAB — BASIC METABOLIC PANEL
Anion gap: 14 (ref 5–15)
BUN: 25 mg/dL — ABNORMAL HIGH (ref 8–23)
CO2: 24 mmol/L (ref 22–32)
Calcium: 7.9 mg/dL — ABNORMAL LOW (ref 8.9–10.3)
Chloride: 100 mmol/L (ref 98–111)
Creatinine, Ser: 3.79 mg/dL — ABNORMAL HIGH (ref 0.61–1.24)
GFR, Estimated: 16 mL/min — ABNORMAL LOW (ref >60)
Glucose, Bld: 159 mg/dL — ABNORMAL HIGH (ref 70–99)
Potassium: 3.3 mmol/L — ABNORMAL LOW (ref 3.5–5.1)
Sodium: 138 mmol/L (ref 135–145)

## 2020-10-17 LAB — CBC WITH DIFFERENTIAL/PLATELET
Abs Immature Granulocytes: 0 10*3/uL (ref 0.00–0.07)
Basophils Absolute: 0 10*3/uL (ref 0.0–0.1)
Basophils Relative: 0 %
Eosinophils Absolute: 0.2 10*3/uL (ref 0.0–0.5)
Eosinophils Relative: 1 %
HCT: 29.4 % — ABNORMAL LOW (ref 39.0–52.0)
Hemoglobin: 9.1 g/dL — ABNORMAL LOW (ref 13.0–17.0)
Lymphocytes Relative: 9 %
Lymphs Abs: 1.6 10*3/uL (ref 0.7–4.0)
MCH: 31.2 pg (ref 26.0–34.0)
MCHC: 31 g/dL (ref 30.0–36.0)
MCV: 100.7 fL — ABNORMAL HIGH (ref 80.0–100.0)
Monocytes Absolute: 2.1 10*3/uL — ABNORMAL HIGH (ref 0.1–1.0)
Monocytes Relative: 12 %
Neutro Abs: 13.5 10*3/uL — ABNORMAL HIGH (ref 1.7–7.7)
Neutrophils Relative %: 78 %
Platelets: 182 10*3/uL (ref 150–400)
RBC: 2.92 MIL/uL — ABNORMAL LOW (ref 4.22–5.81)
RDW: 19.3 % — ABNORMAL HIGH (ref 11.5–15.5)
WBC: 17.3 10*3/uL — ABNORMAL HIGH (ref 4.0–10.5)
nRBC: 0 % (ref 0.0–0.2)
nRBC: 0 /100 WBC

## 2020-10-17 LAB — GLUCOSE, CAPILLARY
Glucose-Capillary: 135 mg/dL — ABNORMAL HIGH (ref 70–99)
Glucose-Capillary: 203 mg/dL — ABNORMAL HIGH (ref 70–99)
Glucose-Capillary: 254 mg/dL — ABNORMAL HIGH (ref 70–99)
Glucose-Capillary: 161 mg/dL — ABNORMAL HIGH (ref 70–99)

## 2020-10-17 LAB — CULTURE, BLOOD (ROUTINE X 2): Culture: NO GROWTH

## 2020-10-17 MED ORDER — CYCLOSPORINE MODIFIED (GENGRAF) 100 MG PO CAPS
100.0000 mg | ORAL_CAPSULE | Freq: Two times a day (BID) | ORAL | Status: DC
  Administered 2020-10-17 – 2020-10-18 (×2): 100 mg via ORAL
  Filled 2020-10-17 (×3): qty 1

## 2020-10-17 MED ORDER — CYCLOSPORINE MODIFIED (GENGRAF) 25 MG PO CAPS
25.0000 mg | ORAL_CAPSULE | Freq: Two times a day (BID) | ORAL | Status: DC
  Administered 2020-10-17 – 2020-10-18 (×2): 25 mg via ORAL
  Filled 2020-10-17 (×3): qty 1

## 2020-10-17 MED ORDER — PREDNISONE 20 MG PO TABS
30.0000 mg | ORAL_TABLET | Freq: Every day | ORAL | Status: DC
  Administered 2020-10-18: 30 mg via ORAL
  Filled 2020-10-17: qty 1

## 2020-10-17 MED ORDER — POTASSIUM CHLORIDE CRYS ER 20 MEQ PO TBCR
40.0000 meq | EXTENDED_RELEASE_TABLET | Freq: Once | ORAL | Status: AC
  Administered 2020-10-17: 40 meq via ORAL
  Filled 2020-10-17: qty 2

## 2020-10-17 NOTE — Consult Note (Signed)
Hospital Consult    Reason for Consult: Left arm pain and hand numbness Referring Physician: Tobie Poet, NP MRN #:  [id number]  History of Present Illness: This is a 70 y.o. male history of left arm AV fistula that he states has been present for 6 years since beginning dialysis.  Fistula was placed at [facility]  He did undergo fistula plication with [place] 2 years ago.  More recently he has complained of left hand numbness as well as swelling of his left upper extremity.  He is now admitted with sepsis and is being treated by the medical team.  With me today he has no complaints of the fistula.  He tells me the fistula is working well despite difficulty with cannulation before.  He has had intervention with [facility] in the past but is unaware of any stents being placed.  He is on subcutaneous heparin here he does not take blood thinners otherwise.  Past Medical History:  Diagnosis Date  . Aortic valve disorders   . Benign neoplasm of colon   . Degeneration of intervertebral disc, site unspecified   . Diabetes mellitus without complication ([place])   . 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 Esophageal reflux   . ESRD (end stage renal disease) on dialysis ([place])   . Essential hypertension   . Obstructive sleep apnea (adult) (pediatric)   . Osteoarthrosis, unspecified whether generalized or localized, unspecified site   . Other and unspecified hyperlipidemia   . Pneumonia    interstitial pneumonia  . Pulmonary fibrosis ([place])   . Renal disorder   . Respiratory failure with hypoxia ([place]) 12/2015  . Transplanted, lung ([place])   . Unspecified essential hypertension     Past Surgical History:  Procedure Laterality Date  . COLONOSCOPY WITH PROPOFOL N/A 11/07/2017   Procedure: COLONOSCOPY WITH PROPOFOL;  Surgeon: Ronnette Juniper, MD;  Location: [facility];  Service: Gastroenterology;  Laterality: N/A;  . LIGATION OF ARTERIOVENOUS  FISTULA Left 01/14/2019   Procedure:  LIGATION OF ARTERIOVENOUS  FISTULA;  Surgeon: Marty Heck, MD;  Location: [place];  Service: Vascular;  Laterality: Left;  . LUNG BIOPSY  2010  . LUNG TRANSPLANT, SINGLE Right   . POLYPECTOMY  11/07/2017   Procedure: POLYPECTOMY;  Surgeon: Ronnette Juniper, MD;  Location: [facility];  Service: Gastroenterology;;    Allergies  Allergen Reactions  . Cefepime Other (See Comments)    Altered mental status  . Levofloxacin Other (See Comments)    LOSS OF CONSCIOUSNESS  . Nsaids Other (See Comments)    Patient instructed not to take NSAID's after his lung transplant  . Tolmetin     Prior to Admission medications   Medication Sig Start Date End Date Taking? Authorizing Provider  acetaminophen (TYLENOL) 160 MG/5ML suspension Place 649.6 mg into feeding tube every 6 (six) hours as needed for mild pain. 09/15/20  Yes [provider]  albuterol (VENTOLIN HFA) 108 (90 Base) MCG/ACT inhaler Inhale 1-2 puffs into the lungs daily as needed for wheezing or shortness of breath. 05/12/20  Yes [provider]  aspirin 81 MG tablet Take 81 mg by mouth daily with breakfast.   Yes [provider]  CYCLOSPORINE PO Take 125 mg by mouth every 12 (twelve) hours.   Yes [provider]  dorzolamidel-timolol (COSOPT PF) 22.3-6.8 MG/ML SOLN ophthalmic solution Place 1 drop into both eyes 2 (two) times daily.   Yes [provider]  fluticasone (FLONASE) 50 MCG/ACT nasal spray Place  2 sprays into the nose daily. 03/05/13  Yes Wenda Low, MD  insulin regular (NOVOLIN R) 100 units/mL injection Inject into the skin See admin instructions. Inject per sliding scale: if 0-199=0; 200-250=1; 251-300=2; 301-350=3; 351-400=4 and call MD, subcutaneously four times a day. Give in addition to schedule amount.   Yes [provider]  Loperamide HCl 1 MG/7.5ML LIQD Take 15 mLs (2 mg total) by G tube 3 (three) times daily as needed for Diarrhea 09/15/20  Yes [provider]   midodrine (PROAMATINE) 10 MG tablet Place 10 mg into feeding tube daily as needed (for hypotension prior to PT/OT PRN for BP).   Yes [provider]  midodrine (PROAMATINE) 10 MG tablet Place 10 mg into feeding tube 2 (two) times daily. Give before dialysis   Yes [provider]  Multiple Vitamin (MULTIVITAMIN WITH MINERALS) TABS tablet Place 1 tablet into feeding tube daily. Centrum for Men 50 plus: on Monday, Wednesday, Friday (dialysis days): take one tablet daily with breakfast; on Sunday, Tuesday, Thursday, Saturday: take one tablet daily with supper   Yes [provider]  omeprazole (FIRST-OMEPRAZOLE) 2 mg/mL SUSP oral suspension Place 40 mg into feeding tube daily.   Yes [provider]  ondansetron (ZOFRAN) 4 MG tablet Place 4 mg into feeding tube every 6 (six) hours as needed for nausea or vomiting.   Yes [provider]  pravastatin (PRAVACHOL) 10 MG tablet Place 10 mg into feeding tube at bedtime. 03/16/20  Yes [provider]  predniSONE (DELTASONE) 5 MG tablet Take 1 tablet (5 mg total) by G tube once daily . Crush and administer via GTube daily. 09/16/20 09/16/21 Yes [provider]  sulfamethoxazole-trimethoprim (BACTRIM,SEPTRA) 400-80 MG tablet Place 1 tablet into feeding tube See admin instructions. On Monday, Wednesday, Friday (dialysis days): take one tablet by mouth daily with supper 02/27/17  Yes [provider]  valganciclovir (VALCYTE) 50 MG/ML SOLR Take 450 mg by mouth See admin instructions. Take one tablet (450 mg) by mouth on Monday, Wednesday, and Friday after dialysis 03/26/15  Yes [provider]  vitamin B-6 (PYRIDOXINE) 25 MG tablet Place 25 mg into feeding tube daily with breakfast.   Yes [provider]  ACCU-CHEK FASTCLIX LANCETS MISC "As directed up to 4 times daily" 12/16/15   [provider]  Nutritional Supplements (FEEDING SUPPLEMENT, NEPRO CARB STEADY,) LIQD Place 1,000  mLs into feeding tube daily. 09/28/20   Fritzi Mandes, MD  oxyCODONE (ROXICODONE) 5 MG immediate release tablet Take 1 tablet (5 mg total) by mouth every 4 (four) hours as needed for severe pain. 10/11/20   Tacy Learn, PA-C    Social History   Socioeconomic History  . Marital status: Married    Spouse name: Not on file  . Number of children: Not on file  . Years of education: Not on file  . Highest education level: Not on file  Occupational History  . Occupation: [employer]  Tobacco Use  . Smoking status: Former Smoker    Packs/day: 0.30    Years: 20.00    Pack years: 6.00    Types: Cigarettes    Quit date: 06/06/2002    Years since quitting: 18.3  . Smokeless tobacco: Never Used  Vaping Use  . Vaping Use: Never used  Substance and Sexual Activity  . Alcohol use: No    Alcohol/week: 0.0 standard drinks  . Drug use: No  . Sexual activity: Not on file  Other Topics Concern  . Not  on file  Social History Narrative  . Not on file   Social Determinants of Health   Financial Resource Strain: Not on file  Food Insecurity: Not on file  Transportation Needs: Not on file  Physical Activity: Not on file  Stress: Not on file  Social Connections: Not on file  Intimate Partner Violence: Not on file     Family History  Problem Relation Age of Onset  . Pancreatic cancer Brother   . Heart disease Father     ROS:  Cardiovascular: []  chest pain/pressure []  palpitations []  SOB lying flat []  DOE []  pain in legs while walking []  pain in legs at rest []  pain in legs at night []  non-healing ulcers []  hx of DVT []  swelling in legs  Pulmonary: []  productive cough []  asthma/wheezing []  home O2  Neurologic: []  weakness in []  arms []  legs []  numbness in []  arms []  legs []  hx of CVA []  mini stroke [] difficulty speaking or slurred speech []  temporary loss of vision in one eye []  dizziness  Hematologic: []  hx of cancer []  bleeding problems []  problems with blood clotting  easily  Endocrine:   []  diabetes []  thyroid disease  GI []  vomiting blood []  blood in stool  GU: []  CKD/renal failure []  HD--[]  M/W/F or []  T/T/S []  burning with urination []  blood in urine  Psychiatric: []  anxiety []  depression  Musculoskeletal: []  arthritis []  joint pain  Integumentary: []  rashes []  ulcers  Constitutional: []  fever []  chills   Physical Examination  Vitals:   10/16/20 2317 10/17/20 0954  BP: 117/65 126/68  Pulse: 80 77  Resp: 20 18  Temp: 98.5 F (36.9 C) (!) 97.5 F (36.4 C)  SpO2: 100% 99%   Body mass index is 23.18 kg/m.  General: No acute distress, he is sleeping but awakes easily HENT: WNL, normocephalic Pulmonary: normal non-labored breathing Cardiac: Pulsatility left upper arm AV fistula Abdomen:  soft, NT Extremities: Dressings are in place left arm AV fistula, this appears to be a left cephalic vein fistula with pulsatility in Musculoskeletal: no muscle wasting or atrophy  Neurologic: A&O X 3   CBC    Component Value Date/Time   WBC 17.3 (H) 10/17/2020 0310   RBC 2.92 (L) 10/17/2020 0310   HGB 9.1 (L) 10/17/2020 0310   HCT 29.4 (L) 10/17/2020 0310   PLT 182 10/17/2020 0310   MCV 100.7 (H) 10/17/2020 0310   MCH 31.2 10/17/2020 0310   MCHC 31.0 10/17/2020 0310   RDW 19.3 (H) 10/17/2020 0310   LYMPHSABS 1.6 10/17/2020 0310   MONOABS 2.1 (H) 10/17/2020 0310   EOSABS 0.2 10/17/2020 0310   BASOSABS 0.0 10/17/2020 0310    BMET    Component Value Date/Time   NA 138 10/17/2020 0310   K 3.3 (L) 10/17/2020 0310   CL 100 10/17/2020 0310   CO2 24 10/17/2020 0310   GLUCOSE 159 (H) 10/17/2020 0310   BUN 25 (H) 10/17/2020 0310   CREATININE 3.79 (H) 10/17/2020 0310   CALCIUM 7.9 (L) 10/17/2020 0310   GFRNONAA 16 (L) 10/17/2020 0310   GFRAA 6 (L) 01/13/2020 0549    COAGS: Lab Results  Component Value Date   INR 1.0 09/26/2020   INR 1.1 04/25/2020   INR 1.0 04/24/2020     Non-Invasive Vascular Imaging:   No  relevant studies   ASSESSMENT/PLAN: This is a 70 y.o. male here with shock thought possibly due to hypervolemia versus sepsis.  This has improved.  He is  dialyzing via his fistula but has had some pain and numbness of his hand.  He is scheduled for [facility] evaluation.  If patient is still here early to midweek we can perform fistulogram here he can otherwise be discharged from a vascular standpoint and return to [facility] where he has been treated in the past.  We will follow peripherally to schedule the procedure if necessary.  I communicated this plan to the patient he demonstrates good understanding and is in agreement.  Dayanis Bergquist C. Donzetta Matters, MD [facility] Office: [phone] Pager: [phone]

## 2020-10-17 NOTE — Progress Notes (Addendum)
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  Patient reported that after dialysis he noted pain in L arm was getting worse, with some generalized fatigue.  On arrival to the ED, he was found to be hypotensive.  Reports subjective fever.  Patient denies any chest pain/chest pressure/shortness of breath/chills/sore throat/headache/dizziness, nausea or vomiting.  No pain in the lower extremities.  Of note, patient required hospitalization in [facility] in April 2022 for respiratory failure, diagnosis and treatment of lung cancer with radiation and subsequent GJ tube placement. PCCM admitted, was placed on Levophed, subsequently weaned off. [facility] hospitalist assumed care on 10/14/2020.     Today, patient noted to have multiple episodes of loose stools, denies its watery, denies any abdominal pain, nausea/vomiting, fever/chills, chest pain, shortness of breath.  Likely associated with antibiotics.  Monitor closely      Assessment/Plan: Active Problems:   Septic shock (HCC)   Likely hypovolemic shock s/p HD- BP improved History of chronic hypotension-s/p IVF and levophed, weaned off Rule out sepsis Currently afebrile, with leukocytosis (on steroids) BC x2 NGTD Chest x-ray unremarkable Continue midodrine Completed Zosyn for total of 5 days Plan to continue taper stress dose steroids  Diarrhea Pt has had multiple episodes of loose stools since last night May be 2/2 antibiotics Monitor closely for now  Pulmonary fibrosis s/p single lung transplant in 2016 on immunosuppressants Currently on stress dose steroids, plan to taper (5 mg prednisone daily at home) Continue  cyclosporine, valganciclovir, Bactrim  ESRD Hypokalemia HD per nephrology M/W/F Plan for outpt fistulogram as scheduled  Anemia of chronic kidney disease Hemoglobin around baseline Daily CBC  Diabetes mellitus type 2 Continue SSI, Accu-Cheks, hypoglycemic protocol  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   Weight as of this encounter: 60.3 kg.     Code Status: Full  Family Communication: None at bedside  Disposition Plan: Status is: Inpatient  Remains inpatient appropriate because:Inpatient level of care appropriate due to severity of illness   Dispo: The patient is from: Home              Anticipated d/c is to: Home              Patient currently is not medically stable to d/c.   Difficult to place patient No    Consultants:  PCCM  Nephrology   Procedures:  None  Antimicrobials:  Completed Zosyn  DVT prophylaxis: Heparin   Objective: Vitals:   10/16/20 1339 10/16/20 1617 10/16/20 2317 10/17/20 0954  BP: 134/72 (!) 144/81 117/65 126/68  Pulse: 78 87 80 77  Resp: 16 17 20 18   Temp: 98.4 F (36.9 C) 98.4 F (36.9 C) 98.5 F (36.9 C) (!) 97.5 F (36.4 C)  TempSrc: Oral Oral  Oral  SpO2: 100% 100% 100% 99%  Weight:      Height:        Intake/Output Summary (Last 24 hours) at 10/17/2020 1646 Last data filed at  10/17/2020 0851 Gross per 24 hour  Intake 1230 ml  Output 200 ml  Net 1030 ml   Filed Weights   10/14/20 1644 10/16/20 0826 10/16/20 1233  Weight: 59.2 kg 62.3 kg 60.3 kg    Exam:  General: NAD   Cardiovascular: S1, S2 present  Respiratory: CTAB  Abdomen: Soft, nontender, nondistended, bowel sounds present, GJ tube noted  Musculoskeletal: No bilateral pedal edema noted, LUE fistula noted, no erythema or swelling  noted  Skin: Normal  Psychiatry: Normal mood    Data Reviewed: CBC: Recent Labs  Lab 10/12/20 2038 10/12/20 2103 10/13/20 0208 10/14/20 0349 10/15/20 0337 10/16/20 0333 10/17/20 0310  WBC 14.8*   < > 18.3* 15.3* 16.1* 15.7* 17.3*  NEUTROABS 11.0*  --   --   --  13.8* 11.9* 13.5*  HGB 9.7*   < > 9.2* 8.3* 8.6* 9.3* 9.1*  HCT 31.8*   < > 30.9* 26.4* 26.9* 29.5* 29.4*  MCV 105.3*   < > 107.3* 102.3* 100.7* 101.0* 100.7*  PLT 229   < > 206 204 198 201 182   < > = values in this interval not displayed.   Basic Metabolic Panel: Recent Labs  Lab 10/13/20 0208 10/14/20 0349 10/15/20 0337 10/16/20 0333 10/17/20 0310  NA 142 139 137 136 138  K 3.2* 3.5 3.7 3.2* 3.3*  CL 100 101 100 99 100  CO2 28 24 25 22 24   GLUCOSE 91 63* 103* 117* 159*  BUN 12 39* 22 43* 25*  CREATININE 3.29* 4.85* 2.86* 4.67* 3.79*  CALCIUM 7.8* 8.1* 8.3* 8.3* 7.9*  MG 1.9  --   --   --   --   PHOS 4.1  --   --   --   --    GFR: Estimated Creatinine Clearance: 14.7 mL/min (A) (by C-G formula based on SCr of 3.79 mg/dL (H)). Liver Function Tests: Recent Labs  Lab 10/12/20 2038  AST 40  ALT 26  ALKPHOS 643*  BILITOT 3.3*  PROT 6.1*  ALBUMIN 1.9*   No results for input(s): LIPASE, AMYLASE in the last 168 hours. No results for input(s): AMMONIA in the last 168 hours. Coagulation Profile: No results for input(s): INR, PROTIME in the last 168 hours. Cardiac Enzymes: No results for input(s): CKTOTAL, CKMB, CKMBINDEX, TROPONINI in the last 168 hours. BNP (last 3 results) No results for input(s): PROBNP in the last 8760 hours. HbA1C: No results for input(s): HGBA1C in the last 72 hours. CBG: Recent Labs  Lab 10/16/20 1622 10/16/20 2012 10/17/20 0701 10/17/20 1139 10/17/20 1613  GLUCAP 190* 156* 135* 203* 254*   Lipid Profile: No results for input(s): CHOL, HDL, LDLCALC, TRIG, CHOLHDL, LDLDIRECT in the last 72 hours. Thyroid Function Tests: No results for input(s): TSH, T4TOTAL,  FREET4, T3FREE, THYROIDAB in the last 72 hours. Anemia Panel: No results for input(s): VITAMINB12, FOLATE, FERRITIN, TIBC, IRON, RETICCTPCT in the last 72 hours. Urine analysis:    Component Value Date/Time   COLORURINE AMBER (A) 12/31/2015 0515   APPEARANCEUR CLOUDY (A) 12/31/2015 0515   LABSPEC 1.016 12/31/2015 0515   PHURINE 7.0 12/31/2015 0515   GLUCOSEU NEGATIVE 12/31/2015 0515   HGBUR NEGATIVE 12/31/2015 0515   BILIRUBINUR SMALL (A) 12/31/2015 0515   KETONESUR 15 (A) 12/31/2015 0515   PROTEINUR 100 (A) 12/31/2015 0515   UROBILINOGEN 2.0 (H) 08/15/2013 1916   NITRITE NEGATIVE 12/31/2015 0515   LEUKOCYTESUR NEGATIVE 12/31/2015 0515   Sepsis Labs: @LABRCNTIP (procalcitonin:4,lacticidven:4)  ) Recent Results (from the past 240  hour(s))  Blood culture (routine x 2)     Status: None   Collection Time: 10/12/20 10:04 PM   Specimen: BLOOD  Result Value Ref Range Status   Specimen Description BLOOD RIGHT HAND  Final   Special Requests   Final    BOTTLES DRAWN AEROBIC AND ANAEROBIC Blood Culture results may not be optimal due to an inadequate volume of blood received in culture bottles   Culture   Final    NO GROWTH 5 DAYS Performed at [facility], [place] [address], [place], [place] [address]    Report Status 10/17/2020 FINAL  Final  Resp Panel by RT-PCR (Flu A&B, Covid) Nasopharyngeal Swab     Status: None   Collection Time: 10/12/20 10:52 PM   Specimen: Nasopharyngeal Swab; Nasopharyngeal(NP) swabs in vial transport medium  Result Value Ref Range Status   SARS Coronavirus 2 by RT PCR NEGATIVE NEGATIVE Final    Comment: (NOTE) SARS-CoV-2 target nucleic acids are NOT DETECTED.  The SARS-CoV-2 RNA is generally detectable in upper respiratory specimens during the acute phase of infection. The lowest concentration of SARS-CoV-2 viral copies this assay can detect is 138 copies/mL. A negative result does not preclude SARS-Cov-2 infection and should not be used as the sole  basis for treatment or other patient management decisions. A negative result may occur with  improper specimen collection/handling, submission of specimen other than nasopharyngeal swab, presence of viral mutation(s) within the areas targeted by this assay, and inadequate number of viral copies(<138 copies/mL). A negative result must be combined with clinical observations, patient history, and epidemiological information. The expected result is Negative.  Fact Sheet for Patients:  EntrepreneurPulse.com.au  Fact Sheet for Healthcare Providers:  IncredibleEmployment.be  This test is no t yet approved or cleared by the [place] FDA and  has been authorized for detection and/or diagnosis of SARS-CoV-2 by FDA under an Emergency Use Authorization (EUA). This EUA will remain  in effect (meaning this test can be used) for the duration of the COVID-19 declaration under Section 564(b)(1) of the Act, 21 U.S.C.section 360bbb-3(b)(1), unless the authorization is terminated  or revoked sooner.       Influenza A by PCR NEGATIVE NEGATIVE Final   Influenza B by PCR NEGATIVE NEGATIVE Final    Comment: (NOTE) The Xpert Xpress SARS-CoV-2/FLU/RSV plus assay is intended as an aid in the diagnosis of influenza from Nasopharyngeal swab specimens and should not be used as a sole basis for treatment. Nasal washings and aspirates are unacceptable for Xpert Xpress SARS-CoV-2/FLU/RSV testing.  Fact Sheet for Patients: EntrepreneurPulse.com.au  Fact Sheet for Healthcare Providers: IncredibleEmployment.be  This test is not yet approved or cleared by the [place] FDA and has been authorized for detection and/or diagnosis of SARS-CoV-2 by FDA under an Emergency Use Authorization (EUA). This EUA will remain in effect (meaning this test can be used) for the duration of the COVID-19 declaration under Section 564(b)(1) of the Act,  21 U.S.C. section 360bbb-3(b)(1), unless the authorization is terminated or revoked.  Performed at [facility], [place] [address] [place], [place] [address]   Blood culture (routine x 2)     Status: None (Preliminary result)   Collection Time: 10/13/20 12:02 AM   Specimen: BLOOD  Result Value Ref Range Status   Specimen Description BLOOD SITE NOT SPECIFIED  Final   Special Requests   Final    BOTTLES DRAWN AEROBIC AND ANAEROBIC Blood Culture adequate volume   Culture   Final  NO GROWTH 4 DAYS Performed at [facility], [place] [address], [place], [place] [address]    Report Status PENDING  Incomplete  MRSA PCR Screening     Status: None   Collection Time: 10/13/20  1:58 AM   Specimen: Nasopharyngeal  Result Value Ref Range Status   MRSA by PCR NEGATIVE NEGATIVE Final    Comment:        The GeneXpert MRSA Assay (FDA approved for NASAL specimens only), is one component of a comprehensive MRSA colonization surveillance program. It is not intended to diagnose MRSA infection nor to guide or monitor treatment for MRSA infections. Performed at [facility], [place] [address], [place], [place] [address]       Studies: No results found.  Scheduled Meds: . Chlorhexidine Gluconate Cloth  6 each Topical Daily  . cycloSPORINE modified  100 mg Oral BID   And  . cycloSPORINE modified  25 mg Oral BID  . dorzolamide-timolol  1 drop Both Eyes BID  . heparin  5,000 Units Subcutaneous Q8H  . insulin aspart  0-5 Units Subcutaneous QHS  . insulin aspart  0-9 Units Subcutaneous TID WC  . mouth rinse  15 mL Mouth Rinse BID  . midodrine  10 mg Oral q12n4p  . pantoprazole (PROTONIX) IV  40 mg Intravenous Daily  . predniSONE  40 mg Oral Q breakfast  . sulfamethoxazole-trimethoprim  1 tablet Oral Once per day on Mon Wed Fri  . valGANciclovir  450 mg Oral Once per day on Mon Wed Fri    Continuous Infusions:    LOS: 4 days     Alma Friendly, MD [facility]  If 7PM-7AM, please contact night-coverage [email or web address] 10/17/2020, 4:46 PM

## 2020-10-17 NOTE — Progress Notes (Signed)
[facility] Progress Note   Subjective:     [name] was seen and examined at bedside today. Last HD 5/13-tolerated UF 1.8L. Denies SOB, CP, ABD pain, and N/V/D. Patient currently denies pain at L AVF site but reports mild numbness to L hand. Patient previously referred to [facility] outpatient for L arm swelling and difficulty with cannulation. Will follow-up with primary on anticipated discharge date-if patient remains inpatient for a few more days, plan to reach out to [facility] for further evaluation. Plan for HD on Monday 10/19/20.  Objective Vitals:   10/16/20 1339 10/16/20 1617 10/16/20 2317 10/17/20 0954  BP: 134/72 (!) 144/81 117/65 126/68  Pulse: 78 87 80 77  Resp: 16 17 20 18   Temp: 98.4 F (36.9 C) 98.4 F (36.9 C) 98.5 F (36.9 C) (!) 97.5 F (36.4 C)  TempSrc: Oral Oral  Oral  SpO2: 100% 100% 100% 99%  Weight:      Height:       Physical Exam General: Frail appearing older male; no acute respiratory distress Heart: Normal S1 and S2; No murmurs, gallops, or friction rub Lungs: Clear anteriorly; diminished bilaterally; No wheezing or rales Abdomen: Soft, non-tender Extremities: No edema bilateral lower extremities Dialysis Access: Trace swelling of L hand; L AVF (+) Bruit/Thrill, No erythema to site.  Filed Weights   10/14/20 1644 10/16/20 0826 10/16/20 1233  Weight: 59.2 kg 62.3 kg 60.3 kg    Intake/Output Summary (Last 24 hours) at 10/17/2020 1158 Last data filed at 10/17/2020 0851 Gross per 24 hour  Intake 1470 ml  Output 2032 ml  Net -562 ml    Additional Objective Labs: Basic Metabolic Panel: Recent Labs  Lab 10/13/20 0208 10/14/20 0349 10/15/20 0337 10/16/20 0333 10/17/20 0310  NA 142   < > 137 136 138  K 3.2*   < > 3.7 3.2* 3.3*  CL 100   < > 100 99 100  CO2 28   < > 25 22 24   GLUCOSE 91   < > 103* 117* 159*  BUN 12   < > 22 43* 25*  CREATININE 3.29*   < > 2.86* 4.67* 3.79*  CALCIUM 7.8*   < > 8.3* 8.3* 7.9*  PHOS 4.1  --   --   --    --    < > = values in this interval not displayed.   Liver Function Tests: Recent Labs  Lab 10/12/20 2038  AST 40  ALT 26  ALKPHOS 643*  BILITOT 3.3*  PROT 6.1*  ALBUMIN 1.9*   No results for input(s): LIPASE, AMYLASE in the last 168 hours. CBC: Recent Labs  Lab 10/13/20 0208 10/14/20 0349 10/15/20 0337 10/16/20 0333 10/17/20 0310  WBC 18.3* 15.3* 16.1* 15.7* 17.3*  NEUTROABS  --   --  13.8* 11.9* 13.5*  HGB 9.2* 8.3* 8.6* 9.3* 9.1*  HCT 30.9* 26.4* 26.9* 29.5* 29.4*  MCV 107.3* 102.3* 100.7* 101.0* 100.7*  PLT 206 204 198 201 182   Blood Culture    Component Value Date/Time   SDES BLOOD SITE NOT SPECIFIED 10/13/2020 0002   SPECREQUEST  10/13/2020 0002    BOTTLES DRAWN AEROBIC AND ANAEROBIC Blood Culture adequate volume   CULT  10/13/2020 0002    NO GROWTH 3 DAYS Performed at [facility], [place] [address] [place], [place] [address]    REPTSTATUS PENDING 10/13/2020 0002    Cardiac Enzymes: No results for input(s): CKTOTAL, CKMB, CKMBINDEX, TROPONINI in the last 168 hours. CBG: Recent  Labs  Lab 10/16/20 1338 10/16/20 1622 10/16/20 2012 10/17/20 0701 10/17/20 1139  GLUCAP 129* 190* 156* 135* 203*   Iron Studies: No results for input(s): IRON, TIBC, TRANSFERRIN, FERRITIN in the last 72 hours. Lab Results  Component Value Date   INR 1.0 09/26/2020   INR 1.1 04/25/2020   INR 1.0 04/24/2020   Studies/Results: No results found.  Medications: . piperacillin-tazobactam (ZOSYN)  IV Stopped (10/17/20 4492)   . Chlorhexidine Gluconate Cloth  6 each Topical Daily  . cycloSPORINE modified  100 mg Oral BID   And  . cycloSPORINE modified  25 mg Oral BID  . dorzolamide-timolol  1 drop Both Eyes BID  . heparin  5,000 Units Subcutaneous Q8H  . insulin aspart  0-5 Units Subcutaneous QHS  . insulin aspart  0-9 Units Subcutaneous TID WC  . mouth rinse  15 mL Mouth Rinse BID  . midodrine  10 mg Oral q12n4p  . pantoprazole (PROTONIX) IV  40 mg Intravenous  Daily  . predniSONE  40 mg Oral Q breakfast  . sulfamethoxazole-trimethoprim  1 tablet Oral Once per day on Mon Wed Fri  . valGANciclovir  450 mg Oral Once per day on Mon Wed Fri    Dialysis Orders: [facility] M[facility] 4h 400/800 EDW 58kg 2K/2Ca   Assessment/Plan: 1. Sepsis w/u -Hypotensive with leukocytosis on admission.Blood cultures ngtd.On Zosyn-per primary 2. ESRD -HD MWF.Last HD 5/13-tolerated UF 1.8L.Plan for HD on Monday 10/19/20. Currently denies pain to L AVF but reports L hand numbness-trace swelling noted to L hand-no erythema and (+) bruit/thrill L AVF-will f/u with primary team for anticipated discharge date. If patient remains inpatient for a few more days, plan to reach out to [facility] for further evaluation. Patient previously referred to [facility] outpatient. 3. Hypotension/volume - Blood pressures remain stable. Hx of hypotension -on midodrine.Has not been reaching dry weight as outpatient. Follow weights here.Over EDW after last HD treatment. Push UF as tolerated. 4. Anemia - Hgb 9.1 . ESA held at dialysis center d/t new dx lung ca. Transfuse prn. Hx GI bleed. Hold heparin 5. Metabolic bone disease -Corr Ca/Phos ok. Follow trends. 6. Nutrition -Renal/carb modified diet. Prot supp. Still has PEG tube from prior admission. Not using 7. S/p lung transplant --on IS -per primary 8. LUL mass/Stage 2B NSCLC  Tobie Poet, NP [facility] 10/17/2020,11:58 AM  LOS: 4 days

## 2020-10-18 DIAGNOSIS — R571 Hypovolemic shock: Principal | ICD-10-CM

## 2020-10-18 LAB — CBC WITH DIFFERENTIAL/PLATELET
Abs Immature Granulocytes: 1.09 10*3/uL — ABNORMAL HIGH (ref 0.00–0.07)
Basophils Absolute: 0 10*3/uL (ref 0.0–0.1)
Basophils Relative: 0 %
Eosinophils Absolute: 0 10*3/uL (ref 0.0–0.5)
Eosinophils Relative: 0 %
HCT: 25.8 % — ABNORMAL LOW (ref 39.0–52.0)
Hemoglobin: 8.3 g/dL — ABNORMAL LOW (ref 13.0–17.0)
Immature Granulocytes: 8 %
Lymphocytes Relative: 2 %
Lymphs Abs: 0.2 10*3/uL — ABNORMAL LOW (ref 0.7–4.0)
MCH: 31.8 pg (ref 26.0–34.0)
MCHC: 32.2 g/dL (ref 30.0–36.0)
MCV: 98.9 fL (ref 80.0–100.0)
Monocytes Absolute: 0.9 10*3/uL (ref 0.1–1.0)
Monocytes Relative: 7 %
Neutro Abs: 11.5 10*3/uL — ABNORMAL HIGH (ref 1.7–7.7)
Neutrophils Relative %: 83 %
Platelets: 163 10*3/uL (ref 150–400)
RBC: 2.61 MIL/uL — ABNORMAL LOW (ref 4.22–5.81)
RDW: 19.1 % — ABNORMAL HIGH (ref 11.5–15.5)
WBC: 13.7 10*3/uL — ABNORMAL HIGH (ref 4.0–10.5)
nRBC: 0 % (ref 0.0–0.2)

## 2020-10-18 LAB — BASIC METABOLIC PANEL
Anion gap: 10 (ref 5–15)
BUN: 48 mg/dL — ABNORMAL HIGH (ref 8–23)
CO2: 24 mmol/L (ref 22–32)
Calcium: 8.4 mg/dL — ABNORMAL LOW (ref 8.9–10.3)
Chloride: 100 mmol/L (ref 98–111)
Creatinine, Ser: 5.38 mg/dL — ABNORMAL HIGH (ref 0.61–1.24)
GFR, Estimated: 11 mL/min — ABNORMAL LOW (ref >60)
Glucose, Bld: 201 mg/dL — ABNORMAL HIGH (ref 70–99)
Potassium: 4 mmol/L (ref 3.5–5.1)
Sodium: 134 mmol/L — ABNORMAL LOW (ref 135–145)

## 2020-10-18 LAB — CULTURE, BLOOD (ROUTINE X 2)
Culture: NO GROWTH
Special Requests: ADEQUATE

## 2020-10-18 LAB — GLUCOSE, CAPILLARY
Glucose-Capillary: 126 mg/dL — ABNORMAL HIGH (ref 70–99)
Glucose-Capillary: 206 mg/dL — ABNORMAL HIGH (ref 70–99)

## 2020-10-18 MED ORDER — PREDNISONE 10 MG PO TABS: ORAL_TABLET | ORAL | 0 refills | Status: AC

## 2020-10-18 NOTE — Progress Notes (Signed)
DISCHARGE NOTE HOME Bard Haupert Qadri to be discharged to home per MD order. Discussed prescriptions and follow up appointments with the patient. Prescriptions given to patient; medication list explained in detail. Patient verbalized understanding. Patient's wife not on her best attitude when informed the need to have an oxygen.Patiet's wife '' our place is just fifteen minutes from here and do you want me to come back for that oxygen?Marland Kitchen R.N explained to him the rationale. Patient's wife said 'I don't know what time ,I am going back here .R.N explained to patient's wife that ,I would try to wean him off from the oxygen,if successful then ,she doesn't need to get his home oxygen.but they need to wait. Patient was monitored for forty minutes at room air and his room oxygen saturation were never down to 94%-see flowsheets documentation. Skin clean, dry and intact without evidence of skin break down, no evidence of skin tears noted. IV catheter discontinued intact. Site without signs and symptoms of complications. Dressing and pressure applied. Pt denies pain at the site currently. No complaints noted.  Patient free of lines, drains, and wounds.   An After Visit Summary (AVS) was printed and given to the patient. Patient escorted via wheelchair, and discharged home via private auto.  [place], Zenon Mayo, RN

## 2020-10-18 NOTE — Progress Notes (Signed)
[facility] Progress Note   Subjective:     Mr. Blankenburg was seen and examined at bedside today. Scheduled for discharge today. Patient seen by Dr. Donzetta Matters ([facility]) per my request d/t patient previously c/o of pain and L hand numbness at L AVF-no complaints of pain and/or L hand numbness at this time. Per [facility], patient ok for fistulogram for outpatient. Denies SOB, CP, ABD pain, and N/V/D.   Objective Vitals:   10/18/20 0916 10/18/20 1304 10/18/20 1329 10/18/20 1344  BP: (!) 144/69     Pulse: 84 80 82 81  Resp: 18     Temp: 98.2 F (36.8 C)     TempSrc:      SpO2: 95% 100% 95% 99%  Weight:      Height:       Physical Exam General:Frail appearing older male; no acute respiratory distress Heart:Normal S1 and S2; No murmurs, gallops, or friction rub Lungs:Clear anteriorly; diminished bilaterally; No wheezing or rales Abdomen:Soft, non-tender Extremities:No edema bilateral lower extremities Dialysis Access:L AVF (+) Bruit/Thrill, No erythema to site.  Filed Weights   10/14/20 1644 10/16/20 0826 10/16/20 1233  Weight: 59.2 kg 62.3 kg 60.3 kg    Intake/Output Summary (Last 24 hours) at 10/18/2020 1433 Last data filed at 10/18/2020 0900 Gross per 24 hour  Intake 480 ml  Output 0 ml  Net 480 ml    Additional Objective Labs: Basic Metabolic Panel: Recent Labs  Lab 10/13/20 0208 10/14/20 0349 10/16/20 0333 10/17/20 0310 10/18/20 0451  NA 142   < > 136 138 134*  K 3.2*   < > 3.2* 3.3* 4.0  CL 100   < > 99 100 100  CO2 28   < > 22 24 24   GLUCOSE 91   < > 117* 159* 201*  BUN 12   < > 43* 25* 48*  CREATININE 3.29*   < > 4.67* 3.79* 5.38*  CALCIUM 7.8*   < > 8.3* 7.9* 8.4*  PHOS 4.1  --   --   --   --    < > = values in this interval not displayed.   Liver Function Tests: Recent Labs  Lab 10/12/20 2038  AST 40  ALT 26  ALKPHOS 643*  BILITOT 3.3*  PROT 6.1*  ALBUMIN 1.9*   No results for input(s): LIPASE, AMYLASE in the last 168  hours. CBC: Recent Labs  Lab 10/14/20 0349 10/15/20 0337 10/16/20 0333 10/17/20 0310 10/18/20 0451  WBC 15.3* 16.1* 15.7* 17.3* 13.7*  NEUTROABS  --  13.8* 11.9* 13.5* 11.5*  HGB 8.3* 8.6* 9.3* 9.1* 8.3*  HCT 26.4* 26.9* 29.5* 29.4* 25.8*  MCV 102.3* 100.7* 101.0* 100.7* 98.9  PLT 204 198 201 182 163   Blood Culture    Component Value Date/Time   SDES BLOOD SITE NOT SPECIFIED 10/13/2020 0002   SPECREQUEST  10/13/2020 0002    BOTTLES DRAWN AEROBIC AND ANAEROBIC Blood Culture adequate volume   CULT  10/13/2020 0002    NO GROWTH 5 DAYS Performed at [facility], [place] [address] [place], [place] [address]    REPTSTATUS 10/18/2020 FINAL 10/13/2020 0002    Cardiac Enzymes: No results for input(s): CKTOTAL, CKMB, CKMBINDEX, TROPONINI in the last 168 hours. CBG: Recent Labs  Lab 10/17/20 1139 10/17/20 1613 10/17/20 2321 10/18/20 0721 10/18/20 1146  GLUCAP 203* 254* 161* 126* 206*   Iron Studies: No results for input(s): IRON, TIBC, TRANSFERRIN, FERRITIN in the last 72 hours. Lab Results  Component Value  Date   INR 1.0 09/26/2020   INR 1.1 04/25/2020   INR 1.0 04/24/2020   Studies/Results: No results found.  Medications:  . Chlorhexidine Gluconate Cloth  6 each Topical Daily  . cycloSPORINE modified  100 mg Oral BID   And  . cycloSPORINE modified  25 mg Oral BID  . dorzolamide-timolol  1 drop Both Eyes BID  . heparin  5,000 Units Subcutaneous Q8H  . insulin aspart  0-5 Units Subcutaneous QHS  . insulin aspart  0-9 Units Subcutaneous TID WC  . mouth rinse  15 mL Mouth Rinse BID  . midodrine  10 mg Oral q12n4p  . pantoprazole (PROTONIX) IV  40 mg Intravenous Daily  . predniSONE  30 mg Oral Q breakfast  . sulfamethoxazole-trimethoprim  1 tablet Oral Once per day on Mon Wed Fri  . valGANciclovir  450 mg Oral Once per day on Mon Wed Fri    Dialysis Orders: [facility] MWF 4h 400/800 EDW 58kg 2K/2Ca  Assessment/Plan: 1. Sepsis w/u -Hypotensive with  leukocytosis on admission.Blood cultures ngtd.On Zosyn-per primary 2. ESRD -HD MWF.Last HD 5/13-tolerated UF 1.8L.Plan for discharge today-patient will resume OP HD in-center on Monday 10/19/20 at [facility]. Patient seen by [facility] per my request to assess L AVF-patient previously c/o pain and L hand numbness to L AVF site-currently, patient denies pain to L AVF site at this time-Per [facility], ok for patient to have fistulogram performed as outpatient-will coordinate with [place] to arrange re-referral to [facility] 3. Hypotension/volume -Blood pressures remain stable. Hx of hypotension -on midodrine.Has not been reaching dry weight as outpatient. Follow weights here.Over EDW after last HD treatment. Push UF as tolerated. 4. Anemia - Hgb8.3. ESA held at dialysis center d/t new dx lung ca. Transfuse prn. Hx GI bleed. Hold heparin 5. Metabolic bone disease -Corr Ca/Phos ok. Follow trends. 6. Nutrition -Renal/carb modified diet. Prot supp. Still has PEG tube from prior admission. Not using 7. S/p lung transplant --on IS -per primary 8. LUL mass/Stage 2B NSCLC 9. Dispo- Discharge today-ok for discharge from renal standpoint  Tobie Poet, NP [place] 10/18/2020,2:33 PM  LOS: 5 days

## 2020-10-18 NOTE — Progress Notes (Signed)
   He has no complaints in his left upper extremity at this time.  Should be okay for outpatient fistulogram.  If he is still here midweek we will plan Tuesday or Wednesday fistulogram otherwise okay for discharge from vascular standpoint.  Matteo Banke C. Donzetta Matters, MD [facility] Office: [phone] Pager: [phone]

## 2020-10-18 NOTE — Discharge Summary (Signed)
Discharge Summary  [name] KVQ:[id number] DOB: [birth year]  PCP: Wenda Low, MD  Admit date: 10/12/2020 Discharge date: 10/18/2020  Time spent: 40 mins  Recommendations for Outpatient Follow-up:  1.  PCP in 1 week 2. Vascular surgery as scheduled     Discharge Diagnoses:  Active Hospital Problems   Diagnosis Date Noted  . Septic shock ([place]) 04/18/2015    Resolved Hospital Problems  No resolved problems to display.    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  Patient reported that after dialysis he noted pain in L arm was getting worse, with some generalized fatigue.  On arrival to the ED, he was found to be hypotensive.  Reports subjective fever.  Patient denies any chest pain/chest pressure/shortness of breath/chills/sore throat/headache/dizziness, nausea or vomiting. No pain in the lower extremities.  Of note, patient required hospitalization in [facility] in April 2022 for respiratory failure, diagnosis and treatment of lung cancer with radiation and subsequent GJ tube placement. PCCM admitted, was placed on Levophed, subsequently weaned off. [facility] hospitalist assumed care on 10/14/2020.     Today, patient denies any new complaints, reports loose stools have resolved.  Denies any nausea vomiting, abdominal pain, chest pain, shortness of breath, fever/chills.  Patient stable for discharge home, to continue with home health services.  Advised to follow-up with PCP in 1 week, vascular surgery as scheduled and be compliant to dialysis  sessions.    Hospital Course:  Active Problems:   Septic shock (HCC)   Likely hypovolemic shock s/p HD- BP improved History of chronic hypotension-s/p IVF and levophed, weaned off Ruled out sepsis Currently afebrile, with leukocytosis (on steroids) BC x2 NGTD Chest x-ray unremarkable Continue midodrine Completed Zosyn for total of 5 days Plan to continue taper stress dose steroids back to regular dose of 5 mg daily  Diarrhea Resolved Pt had multiple episodes of loose stools on 10/17/20 Likely 2/2 antibiotics Loperamide prn  Pulmonary fibrosis s/p single lung transplant in 2016 on immunosuppressants Continue to taper steroids (5 mg prednisone daily at home) Continue cyclosporine, valganciclovir, Bactrim  ESRD Hypokalemia HD per nephrology M/W/F Plan for outpt fistulogram with vascular surgery as scheduled  Anemia of chronic kidney disease Hemoglobin around baseline  Diabetes mellitus type 2 Continue home regimen  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   Weight as of this encounter: 60.3 kg.    Procedures:  None  Consultations:  Nephrology  PCCM  Vascular surgery   Discharge Exam: BP (!) 144/69 (BP Location: Right Arm)   Pulse 84   Temp 98.2 F (36.8 C)   Resp 18   Ht 5' 3.5" (1.613 m)   Wt 60.3 kg   SpO2 95%   BMI 23.18 kg/m   General: NAD Cardiovascular: S1, S2 present  Respiratory: CTAB  Discharge Instructions You were cared for by a hospitalist during your hospital stay. If you have any questions about your discharge medications or the care you received while you were in the hospital after you are discharged, you can call the unit and asked to speak with the hospitalist on call if the hospitalist that took care of you  is not available. Once you are discharged, your primary care physician will handle any further medical issues. Please note that NO REFILLS for any discharge medications will be authorized once you are discharged, as it is imperative that you return to your primary care physician (or establish a relationship with a primary care physician if you do not have one) for your aftercare needs so that they can reassess your need for medications and monitor your lab values.  Discharge Instructions    Diet - low sodium heart healthy   Complete by: As directed    Increase activity slowly   Complete by: As directed      Allergies as of 10/18/2020      Reactions   Cefepime Other (See Comments)   Altered mental status   Levofloxacin Other (See Comments)   LOSS OF CONSCIOUSNESS   Nsaids Other (See Comments)   Patient instructed not to take NSAID's after his lung transplant   Tolmetin       Medication List    STOP taking these medications   lidocaine 5 % Commonly known as: LIDODERM     TAKE these medications   Accu-Chek FastClix Lancets Misc "As directed up to 4 times daily"   acetaminophen 160 MG/5ML suspension Commonly known as: TYLENOL Place 649.6 mg into feeding tube every 6 (six) hours as needed for mild pain.   albuterol 108 (90 Base) MCG/ACT inhaler Commonly known as: VENTOLIN HFA Inhale 1-2 puffs into the lungs daily as needed for wheezing or shortness of breath.   aspirin 81 MG tablet Take 81 mg by mouth daily with breakfast.   Cosopt PF 22.3-6.8 MG/ML Soln ophthalmic solution Generic drug: dorzolamidel-timolol Place 1 drop into both eyes 2 (two) times daily.   CYCLOSPORINE PO Take 125 mg by mouth every 12 (twelve) hours.   feeding supplement (NEPRO CARB STEADY) Liqd Place 1,000 mLs into feeding tube daily.   fluticasone 50 MCG/ACT nasal spray Commonly known as: FLONASE Place 2 sprays into the nose daily.   insulin regular 100 units/mL injection Commonly known as:  NOVOLIN R Inject into the skin See admin instructions. Inject per sliding scale: if 0-199=0; 200-250=1; 251-300=2; 301-350=3; 351-400=4 and call MD, subcutaneously four times a day. Give in addition to schedule amount.   Loperamide HCl 1 MG/7.5ML Liqd Take 15 mLs (2 mg total) by G tube 3 (three) times daily as needed for Diarrhea   midodrine 10 MG tablet Commonly known as: PROAMATINE Place 10 mg into feeding tube daily as needed (for hypotension prior to PT/OT PRN for BP).   midodrine 10 MG tablet Commonly known as: PROAMATINE Place 10 mg into feeding tube 2 (two) times daily. Give before dialysis   multivitamin with minerals Tabs tablet Place 1 tablet into feeding tube daily. Centrum for Men 50 plus: on Monday, Wednesday, Friday (dialysis days): take one tablet daily with breakfast; on Sunday, Tuesday, Thursday, Saturday: take one tablet daily with supper   omeprazole 2 mg/mL Susp oral suspension Commonly known as: FIRST-Omeprazole Place 40 mg into feeding tube daily.   ondansetron 4 MG tablet Commonly known as: ZOFRAN Place 4 mg into feeding tube  every 6 (six) hours as needed for nausea or vomiting.   oxyCODONE 5 MG immediate release tablet Commonly known as: Roxicodone Take 1 tablet (5 mg total) by mouth every 4 (four) hours as needed for severe pain.   pravastatin 10 MG tablet Commonly known as: PRAVACHOL Place 10 mg into feeding tube at bedtime.   predniSONE 5 MG tablet Commonly known as: DELTASONE Take 1 tablet (5 mg total) by G tube once daily . Crush and administer via GTube daily. What changed: Another medication with the same name was added. Make sure you understand how and when to take each.   predniSONE 10 MG tablet Commonly known as: DELTASONE Take 2 tablets (20 mg total) by mouth daily with breakfast for 1 day, THEN 1 tablet (10 mg total) daily with breakfast for 1 day. After taking this for 2 days (on 5/16 and 5/17), continue with your regular 5 mg daily as  previously taken. Start taking on: Oct 19, 2020 What changed: You were already taking a medication with the same name, and this prescription was added. Make sure you understand how and when to take each.   sulfamethoxazole-trimethoprim 400-80 MG tablet Commonly known as: BACTRIM Place 1 tablet into feeding tube See admin instructions. On Monday, Wednesday, Friday (dialysis days): take one tablet by mouth daily with supper   valganciclovir 50 MG/ML Solr Commonly known as: VALCYTE Take 450 mg by mouth See admin instructions. Take one tablet (450 mg) by mouth on Monday, Wednesday, and Friday after dialysis   vitamin B-6 25 MG tablet Commonly known as: pyridOXINE Place 25 mg into feeding tube daily with breakfast.      Allergies  Allergen Reactions  . Cefepime Other (See Comments)    Altered mental status  . Levofloxacin Other (See Comments)    LOSS OF CONSCIOUSNESS  . Nsaids Other (See Comments)    Patient instructed not to take NSAID's after his lung transplant  . Tolmetin     Follow-up Information    Wenda Low, MD. Schedule an appointment as soon as possible for a visit in 1 week(s).   Specialty: Internal Medicine Contact information: [address] [place] [address] [phone]                The results of significant diagnostics from this hospitalization (including imaging, microbiology, ancillary and laboratory) are listed below for reference.    Significant Diagnostic Studies: CT Head Wo Contrast  Result Date: 10/12/2020 CLINICAL DATA:  Weakness elevated blood pressure EXAM: CT HEAD WITHOUT CONTRAST TECHNIQUE: Contiguous axial images were obtained from the base of the skull through the vertex without intravenous contrast. COMPARISON:  CT brain 04/24/2020, 02/05/2019 FINDINGS: Brain: No acute territorial infarction, hemorrhage or intracranial mass. Chronic lacunar infarct or prominent perivascular space in the right subinsular region  without change. Stable ventricle size. Vascular: No hyperdense vessels.  Carotid vascular calcification Skull: Normal. Negative for fracture or focal lesion. Sinuses/Orbits: Patchy mucosal thickening in the sinuses Other: None IMPRESSION: No CT evidence for acute intracranial abnormality. Electronically Signed   By: Donavan Foil M.D.   On: 10/12/2020 21:51   DG Chest Port 1 View  Result Date: 10/14/2020 CLINICAL DATA:  Shortness of breath EXAM: PORTABLE CHEST 1 VIEW COMPARISON:  10/12/2020 FINDINGS: Postoperative changes. Heart is normal size. Aortic calcifications. Chronic fibrotic changes throughout the left lung again noted, stable. Scarring in the right lung base. No change since prior study. IMPRESSION: No interval change. Electronically Signed   By:  Rolm Baptise M.D.   On: 10/14/2020 08:24   DG Chest Portable 1 View  Result Date: 10/12/2020 CLINICAL DATA:  Hypotension following dialysis EXAM: PORTABLE CHEST 1 VIEW COMPARISON:  09/27/2020 FINDINGS: Cardiac shadow is stable. Aortic calcifications are noted. Vascular stents are noted in the region of the left shoulder and left innominate vein stable from the prior exam. Previously seen tunneled PICC line on the right has been removed. Postsurgical changes are noted. Chronic fibrotic changes in the left lung are again noted. No acute infiltrate is seen. IMPRESSION: Chronic changes without acute abnormality. Electronically Signed   By: Inez Catalina M.D.   On: 10/12/2020 21:03   DG Chest Port 1 View  Result Date: 09/27/2020 CLINICAL DATA:  Anemia EXAM: PORTABLE CHEST 1 VIEW COMPARISON:  09/18/2020 FINDINGS: There is fibrotic change in the left lung with unchanged left apical opacity. Right IJ approach central venous catheter tip is in unchanged position. Small right pleural effusion. IMPRESSION: 1. Unchanged appearance of the chest with fibrotic changes in the left lung. Electronically Signed   By: Ulyses Jarred M.D.   On: 09/27/2020 02:26     Microbiology: Recent Results (from the past 240 hour(s))  Blood culture (routine x 2)     Status: None   Collection Time: 10/12/20 10:04 PM   Specimen: BLOOD  Result Value Ref Range Status   Specimen Description BLOOD RIGHT HAND  Final   Special Requests   Final    BOTTLES DRAWN AEROBIC AND ANAEROBIC Blood Culture results may not be optimal due to an inadequate volume of blood received in culture bottles   Culture   Final    NO GROWTH 5 DAYS Performed at [facility], [place] [address], [place], [place] [address]    Report Status 10/17/2020 FINAL  Final  Resp Panel by RT-PCR (Flu A&B, Covid) Nasopharyngeal Swab     Status: None   Collection Time: 10/12/20 10:52 PM   Specimen: Nasopharyngeal Swab; Nasopharyngeal(NP) swabs in vial transport medium  Result Value Ref Range Status   SARS Coronavirus 2 by RT PCR NEGATIVE NEGATIVE Final    Comment: (NOTE) SARS-CoV-2 target nucleic acids are NOT DETECTED.  The SARS-CoV-2 RNA is generally detectable in upper respiratory specimens during the acute phase of infection. The lowest concentration of SARS-CoV-2 viral copies this assay can detect is 138 copies/mL. A negative result does not preclude SARS-Cov-2 infection and should not be used as the sole basis for treatment or other patient management decisions. A negative result may occur with  improper specimen collection/handling, submission of specimen other than nasopharyngeal swab, presence of viral mutation(s) within the areas targeted by this assay, and inadequate number of viral copies(<138 copies/mL). A negative result must be combined with clinical observations, patient history, and epidemiological information. The expected result is Negative.  Fact Sheet for Patients:  EntrepreneurPulse.com.au  Fact Sheet for Healthcare Providers:  IncredibleEmployment.be  This test is no t yet approved or cleared by the [place] FDA and  has been  authorized for detection and/or diagnosis of SARS-CoV-2 by FDA under an Emergency Use Authorization (EUA). This EUA will remain  in effect (meaning this test can be used) for the duration of the COVID-19 declaration under Section 564(b)(1) of the Act, 21 U.S.C.section 360bbb-3(b)(1), unless the authorization is terminated  or revoked sooner.       Influenza A by PCR NEGATIVE NEGATIVE Final   Influenza B by PCR NEGATIVE NEGATIVE Final    Comment: (NOTE) The Xpert  Xpress SARS-CoV-2/FLU/RSV plus assay is intended as an aid in the diagnosis of influenza from Nasopharyngeal swab specimens and should not be used as a sole basis for treatment. Nasal washings and aspirates are unacceptable for Xpert Xpress SARS-CoV-2/FLU/RSV testing.  Fact Sheet for Patients: EntrepreneurPulse.com.au  Fact Sheet for Healthcare Providers: IncredibleEmployment.be  This test is not yet approved or cleared by the [place] FDA and has been authorized for detection and/or diagnosis of SARS-CoV-2 by FDA under an Emergency Use Authorization (EUA). This EUA will remain in effect (meaning this test can be used) for the duration of the COVID-19 declaration under Section 564(b)(1) of the Act, 21 U.S.C. section 360bbb-3(b)(1), unless the authorization is terminated or revoked.  Performed at [facility], [place] [address], [place], [place] [address]   Blood culture (routine x 2)     Status: None (Preliminary result)   Collection Time: 10/13/20 12:02 AM   Specimen: BLOOD  Result Value Ref Range Status   Specimen Description BLOOD SITE NOT SPECIFIED  Final   Special Requests   Final    BOTTLES DRAWN AEROBIC AND ANAEROBIC Blood Culture adequate volume   Culture   Final    NO GROWTH 4 DAYS Performed at [facility], [address] [place], [place] [address]    Report Status PENDING  Incomplete  MRSA PCR Screening     Status: None   Collection Time: 10/13/20   1:58 AM   Specimen: Nasopharyngeal  Result Value Ref Range Status   MRSA by PCR NEGATIVE NEGATIVE Final    Comment:        The GeneXpert MRSA Assay (FDA approved for NASAL specimens only), is one component of a comprehensive MRSA colonization surveillance program. It is not intended to diagnose MRSA infection nor to guide or monitor treatment for MRSA infections. Performed at [facility], [place] [address], [place], [place] [address]      Labs: Basic Metabolic Panel: Recent Labs  Lab 10/13/20 [phone] 10/14/20 0349 10/15/20 0337 10/16/20 0333 10/17/20 0310 10/18/20 0451  NA 142 139 137 136 138 134*  K 3.2* 3.5 3.7 3.2* 3.3* 4.0  CL 100 101 100 99 100 100  CO2 28 24 25 22 24 24   GLUCOSE 91 63* 103* 117* 159* 201*  BUN 12 39* 22 43* 25* 48*  CREATININE 3.29* 4.85* 2.86* 4.67* 3.79* 5.38*  CALCIUM 7.8* 8.1* 8.3* 8.3* 7.9* 8.4*  MG 1.9  --   --   --   --   --   PHOS 4.1  --   --   --   --   --    Liver Function Tests: Recent Labs  Lab 10/12/20 2038  AST 40  ALT 26  ALKPHOS 643*  BILITOT 3.3*  PROT 6.1*  ALBUMIN 1.9*   No results for input(s): LIPASE, AMYLASE in the last 168 hours. No results for input(s): AMMONIA in the last 168 hours. CBC: Recent Labs  Lab 10/12/20 2038 10/12/20 2103 10/14/20 0349 10/15/20 0337 10/16/20 0333 10/17/20 0310 10/18/20 0451  WBC 14.8*   < > 15.3* 16.1* 15.7* 17.3* 13.7*  NEUTROABS 11.0*  --   --  13.8* 11.9* 13.5* 11.5*  HGB 9.7*   < > 8.3* 8.6* 9.3* 9.1* 8.3*  HCT 31.8*   < > 26.4* 26.9* 29.5* 29.4* 25.8*  MCV 105.3*   < > 102.3* 100.7* 101.0* 100.7* 98.9  PLT 229   < > 204 198 201 182 163   < > =  values in this interval not displayed.   Cardiac Enzymes: No results for input(s): CKTOTAL, CKMB, CKMBINDEX, TROPONINI in the last 168 hours. BNP: BNP (last 3 results) Recent Labs    07/17/20 1436  BNP 272.0*    ProBNP (last 3 results) No results for input(s): PROBNP in the last 8760 hours.  CBG: Recent Labs  Lab  10/17/20 0701 10/17/20 1139 10/17/20 1613 10/17/20 2321 10/18/20 0721  GLUCAP 135* 203* 254* 161* 126*       Signed:  Alma Friendly, MD [facility] 10/18/2020, 10:22 AM

## 2020-10-19 ENCOUNTER — Telehealth (HOSPITAL_COMMUNITY): Payer: Self-pay | Admitting: Nephrology

## 2020-10-19 DIAGNOSIS — N186 End stage renal disease: Secondary | ICD-10-CM | POA: Diagnosis not present

## 2020-10-19 DIAGNOSIS — T86818 Other complications of lung transplant: Secondary | ICD-10-CM | POA: Diagnosis not present

## 2020-10-19 DIAGNOSIS — J8410 Pulmonary fibrosis, unspecified: Secondary | ICD-10-CM | POA: Diagnosis not present

## 2020-10-19 DIAGNOSIS — Z992 Dependence on renal dialysis: Secondary | ICD-10-CM | POA: Diagnosis not present

## 2020-10-19 DIAGNOSIS — C787 Secondary malignant neoplasm of liver and intrahepatic bile duct: Secondary | ICD-10-CM | POA: Diagnosis not present

## 2020-10-19 DIAGNOSIS — R918 Other nonspecific abnormal finding of lung field: Secondary | ICD-10-CM | POA: Diagnosis not present

## 2020-10-19 DIAGNOSIS — D631 Anemia in chronic kidney disease: Secondary | ICD-10-CM | POA: Diagnosis not present

## 2020-10-19 DIAGNOSIS — N2581 Secondary hyperparathyroidism of renal origin: Secondary | ICD-10-CM | POA: Diagnosis not present

## 2020-10-19 DIAGNOSIS — C3492 Malignant neoplasm of unspecified part of left bronchus or lung: Secondary | ICD-10-CM | POA: Diagnosis not present

## 2020-10-19 DIAGNOSIS — C3412 Malignant neoplasm of upper lobe, left bronchus or lung: Secondary | ICD-10-CM | POA: Diagnosis not present

## 2020-10-19 DIAGNOSIS — Z79899 Other long term (current) drug therapy: Secondary | ICD-10-CM | POA: Diagnosis not present

## 2020-10-19 DIAGNOSIS — M47814 Spondylosis without myelopathy or radiculopathy, thoracic region: Secondary | ICD-10-CM | POA: Diagnosis not present

## 2020-10-19 DIAGNOSIS — Z5181 Encounter for therapeutic drug level monitoring: Secondary | ICD-10-CM | POA: Diagnosis not present

## 2020-10-19 DIAGNOSIS — Z942 Lung transplant status: Secondary | ICD-10-CM | POA: Diagnosis not present

## 2020-10-19 DIAGNOSIS — Z7189 Other specified counseling: Secondary | ICD-10-CM | POA: Diagnosis not present

## 2020-10-19 DIAGNOSIS — T86819 Unspecified complication of lung transplant: Secondary | ICD-10-CM | POA: Diagnosis not present

## 2020-10-19 NOTE — Telephone Encounter (Signed)
Transition of care contact from inpatient facility  Date of Discharge: 10/18/2020 Date of Contact: 10/19/2020 Method of contact: Phone  Attempted to contact patient to discuss transition of care from inpatient admission. Patient did not answer the phone. Message was left on the patient's voicemail with call back number [phone].  Veneta Penton, PA-C [employer] Pager [phone]

## 2020-10-20 DIAGNOSIS — Z992 Dependence on renal dialysis: Secondary | ICD-10-CM | POA: Diagnosis not present

## 2020-10-20 DIAGNOSIS — J8410 Pulmonary fibrosis, unspecified: Secondary | ICD-10-CM | POA: Diagnosis not present

## 2020-10-20 DIAGNOSIS — Z5181 Encounter for therapeutic drug level monitoring: Secondary | ICD-10-CM | POA: Diagnosis not present

## 2020-10-20 DIAGNOSIS — Z942 Lung transplant status: Secondary | ICD-10-CM | POA: Diagnosis not present

## 2020-10-20 DIAGNOSIS — Z931 Gastrostomy status: Secondary | ICD-10-CM | POA: Diagnosis not present

## 2020-10-20 DIAGNOSIS — N186 End stage renal disease: Secondary | ICD-10-CM | POA: Diagnosis not present

## 2020-10-20 DIAGNOSIS — Z7189 Other specified counseling: Secondary | ICD-10-CM | POA: Diagnosis not present

## 2020-10-20 DIAGNOSIS — C3492 Malignant neoplasm of unspecified part of left bronchus or lung: Secondary | ICD-10-CM | POA: Diagnosis not present

## 2020-10-20 DIAGNOSIS — C3412 Malignant neoplasm of upper lobe, left bronchus or lung: Secondary | ICD-10-CM | POA: Diagnosis not present

## 2020-10-20 DIAGNOSIS — T86818 Other complications of lung transplant: Secondary | ICD-10-CM | POA: Diagnosis not present

## 2020-10-20 DIAGNOSIS — M47814 Spondylosis without myelopathy or radiculopathy, thoracic region: Secondary | ICD-10-CM | POA: Diagnosis not present

## 2020-10-20 DIAGNOSIS — Z7952 Long term (current) use of systemic steroids: Secondary | ICD-10-CM | POA: Diagnosis not present

## 2020-10-20 DIAGNOSIS — C787 Secondary malignant neoplasm of liver and intrahepatic bile duct: Secondary | ICD-10-CM | POA: Diagnosis not present

## 2020-10-20 DIAGNOSIS — R918 Other nonspecific abnormal finding of lung field: Secondary | ICD-10-CM | POA: Diagnosis not present

## 2020-10-20 DIAGNOSIS — Z79899 Other long term (current) drug therapy: Secondary | ICD-10-CM | POA: Diagnosis not present

## 2020-10-21 ENCOUNTER — Telehealth: Payer: Self-pay

## 2020-10-21 DIAGNOSIS — N186 End stage renal disease: Secondary | ICD-10-CM | POA: Diagnosis not present

## 2020-10-21 DIAGNOSIS — N2581 Secondary hyperparathyroidism of renal origin: Secondary | ICD-10-CM | POA: Diagnosis not present

## 2020-10-21 DIAGNOSIS — D631 Anemia in chronic kidney disease: Secondary | ICD-10-CM | POA: Diagnosis not present

## 2020-10-21 DIAGNOSIS — Z992 Dependence on renal dialysis: Secondary | ICD-10-CM | POA: Diagnosis not present

## 2020-10-21 NOTE — Telephone Encounter (Signed)
Attempted to contact patient/ wife [name] to schedule a Palliative Care consult appointment. No answer left a message to return call.

## 2020-10-22 ENCOUNTER — Telehealth: Payer: Self-pay

## 2020-10-22 DIAGNOSIS — I120 Hypertensive chronic kidney disease with stage 5 chronic kidney disease or end stage renal disease: Secondary | ICD-10-CM | POA: Diagnosis not present

## 2020-10-22 DIAGNOSIS — N186 End stage renal disease: Secondary | ICD-10-CM | POA: Diagnosis not present

## 2020-10-22 DIAGNOSIS — A403 Sepsis due to Streptococcus pneumoniae: Secondary | ICD-10-CM | POA: Diagnosis not present

## 2020-10-22 DIAGNOSIS — D631 Anemia in chronic kidney disease: Secondary | ICD-10-CM | POA: Diagnosis not present

## 2020-10-22 DIAGNOSIS — J9610 Chronic respiratory failure, unspecified whether with hypoxia or hypercapnia: Secondary | ICD-10-CM | POA: Diagnosis not present

## 2020-10-22 DIAGNOSIS — E1122 Type 2 diabetes mellitus with diabetic chronic kidney disease: Secondary | ICD-10-CM | POA: Diagnosis not present

## 2020-10-22 NOTE — Telephone Encounter (Signed)
Spoke with patient's wife Molli Posey and scheduled an in-person Palliative Consult for 11/03/20 @ 9AM. Patient has dialysis M, W, F.   COVID screening was negative. No pets in home. Patient lives with wife.  Consent obtained; updated Outlook/Netsmart/Team List and Epic.   Family is aware they may be receiving a call from NP the day before or day of to confirm appointment.

## 2020-10-23 ENCOUNTER — Other Ambulatory Visit: Payer: Self-pay

## 2020-10-23 ENCOUNTER — Encounter (HOSPITAL_COMMUNITY): Payer: Self-pay

## 2020-10-23 ENCOUNTER — Emergency Department (HOSPITAL_COMMUNITY)
Admission: EM | Admit: 2020-10-23 | Discharge: 2020-10-23 | Disposition: A | Discharge status: Home / Self Care | Payer: Medicare Other | Attending: Emergency Medicine | Admitting: Emergency Medicine

## 2020-10-23 DIAGNOSIS — N2581 Secondary hyperparathyroidism of renal origin: Secondary | ICD-10-CM | POA: Diagnosis not present

## 2020-10-23 DIAGNOSIS — I959 Hypotension, unspecified: Secondary | ICD-10-CM | POA: Diagnosis not present

## 2020-10-23 DIAGNOSIS — R531 Weakness: Secondary | ICD-10-CM | POA: Diagnosis not present

## 2020-10-23 DIAGNOSIS — E1122 Type 2 diabetes mellitus with diabetic chronic kidney disease: Secondary | ICD-10-CM | POA: Diagnosis not present

## 2020-10-23 DIAGNOSIS — I120 Hypertensive chronic kidney disease with stage 5 chronic kidney disease or end stage renal disease: Secondary | ICD-10-CM | POA: Insufficient documentation

## 2020-10-23 DIAGNOSIS — N186 End stage renal disease: Secondary | ICD-10-CM | POA: Diagnosis not present

## 2020-10-23 DIAGNOSIS — Z7982 Long term (current) use of aspirin: Secondary | ICD-10-CM | POA: Insufficient documentation

## 2020-10-23 DIAGNOSIS — D631 Anemia in chronic kidney disease: Secondary | ICD-10-CM | POA: Diagnosis not present

## 2020-10-23 DIAGNOSIS — Z85038 Personal history of other malignant neoplasm of large intestine: Secondary | ICD-10-CM | POA: Diagnosis not present

## 2020-10-23 DIAGNOSIS — Z79899 Other long term (current) drug therapy: Secondary | ICD-10-CM | POA: Diagnosis not present

## 2020-10-23 DIAGNOSIS — Z794 Long term (current) use of insulin: Secondary | ICD-10-CM | POA: Diagnosis not present

## 2020-10-23 DIAGNOSIS — Z992 Dependence on renal dialysis: Secondary | ICD-10-CM | POA: Diagnosis not present

## 2020-10-23 DIAGNOSIS — Z87891 Personal history of nicotine dependence: Secondary | ICD-10-CM | POA: Diagnosis not present

## 2020-10-23 LAB — CBC WITH DIFFERENTIAL/PLATELET
Abs Immature Granulocytes: 1.03 10*3/uL — ABNORMAL HIGH (ref 0.00–0.07)
Basophils Absolute: 0 10*3/uL (ref 0.0–0.1)
Basophils Relative: 0 %
Eosinophils Absolute: 0.1 10*3/uL (ref 0.0–0.5)
Eosinophils Relative: 1 %
HCT: 25.2 % — ABNORMAL LOW (ref 39.0–52.0)
Hemoglobin: 7.9 g/dL — ABNORMAL LOW (ref 13.0–17.0)
Immature Granulocytes: 7 %
Lymphocytes Relative: 3 %
Lymphs Abs: 0.5 10*3/uL — ABNORMAL LOW (ref 0.7–4.0)
MCH: 32.2 pg (ref 26.0–34.0)
MCHC: 31.3 g/dL (ref 30.0–36.0)
MCV: 102.9 fL — ABNORMAL HIGH (ref 80.0–100.0)
Monocytes Absolute: 1.6 10*3/uL — ABNORMAL HIGH (ref 0.1–1.0)
Monocytes Relative: 11 %
Neutro Abs: 11.3 10*3/uL — ABNORMAL HIGH (ref 1.7–7.7)
Neutrophils Relative %: 78 %
Platelets: 134 10*3/uL — ABNORMAL LOW (ref 150–400)
RBC: 2.45 MIL/uL — ABNORMAL LOW (ref 4.22–5.81)
RDW: 19.4 % — ABNORMAL HIGH (ref 11.5–15.5)
WBC: 14.6 10*3/uL — ABNORMAL HIGH (ref 4.0–10.5)
nRBC: 0 % (ref 0.0–0.2)

## 2020-10-23 LAB — BASIC METABOLIC PANEL
Anion gap: 9 (ref 5–15)
BUN: 10 mg/dL (ref 8–23)
CO2: 31 mmol/L (ref 22–32)
Calcium: 7.3 mg/dL — ABNORMAL LOW (ref 8.9–10.3)
Chloride: 99 mmol/L (ref 98–111)
Creatinine, Ser: 2.3 mg/dL — ABNORMAL HIGH (ref 0.61–1.24)
GFR, Estimated: 30 mL/min — ABNORMAL LOW (ref >60)
Glucose, Bld: 102 mg/dL — ABNORMAL HIGH (ref 70–99)
Potassium: 2.9 mmol/L — ABNORMAL LOW (ref 3.5–5.1)
Sodium: 139 mmol/L (ref 135–145)

## 2020-10-23 NOTE — ED Notes (Signed)
E-signature pad unavailable at time of pt discharge. This RN discussed discharge materials with pt and answered all pt questions. Pt stated understanding of discharge material. ? ?

## 2020-10-23 NOTE — ED Provider Notes (Signed)
[facility] EMERGENCY DEPARTMENT Provider Note   CSN: [id number] Arrival date & time: 10/23/20  1813     History Chief Complaint  Patient presents with  . Hypotension    [name] is a 70 y.o. male.  Apparently patient was at dialysis and had low blood pressure. Was asymptomatic from it and still is. He states his family thought he should get checked out. He is not overly tired. Has not had syncope, sob, chest pain, vision changes or other associated symptoms. He feels fine now too. No recent illnesses. Eating/drinking normally.         Past Medical History:  Diagnosis Date  . Aortic valve disorders   . Benign neoplasm of colon   . Degeneration of intervertebral disc, site unspecified   . Diabetes mellitus without complication ([place])   . 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 Esophageal reflux   . ESRD (end stage renal disease) on dialysis ([place])   . Essential hypertension   . Obstructive sleep apnea (adult) (pediatric)   . Osteoarthrosis, unspecified whether generalized or localized, unspecified site   . Other and unspecified hyperlipidemia   . Pneumonia    interstitial pneumonia  . Pulmonary fibrosis ([place])   . Renal disorder   . Respiratory failure with hypoxia ([place]) 12/2015  . Transplanted, lung ([place])   . Unspecified essential hypertension     Patient Active Problem List   Diagnosis Date Noted  . Anemia 09/26/2020  . Exercise hypoxemia   . Pneumonia 07/17/2020  . Lung mass 07/17/2020  . Acute bronchiolitis due to human metapneumovirus 04/25/2020  . Sepsis with acute organ dysfunction ([place]) 01/08/2020  . Lactic acidosis 01/08/2020  . Mixed diabetic hyperlipidemia associated with type 2 diabetes mellitus ([place]) 01/08/2020  . Hepatic cirrhosis ([place]) 01/08/2020  . Uncontrolled type 2 diabetes mellitus with hyperglycemia, with long-term current use of insulin ([place]) 01/08/2020  . IV infiltrate, initial encounter  01/08/2020  . Acute diarrhea 01/08/2020  . Acute metabolic encephalopathy 83/41/9622  . 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 AV fistula infection, initial encounter ([place]) 01/14/2019  . Immunocompromised state ([place]) 01/14/2019  . Wound infection 01/13/2019  . Sepsis ([place])   . CKD (chronic kidney disease)   . Generalized weakness 12/30/2015  . ESRD (end stage renal disease) ([place])   . 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 Acute respiratory failure with hypoxemia ([place]) 04/18/2015  . Septic shock ([place]) 04/18/2015  . Acute encephalopathy 04/18/2015  . Acute respiratory failure with hypoxia ([place]) 04/18/2015  . Cardiac arrest ([place])   . HCAP (healthcare-associated pneumonia)   . Elevated rheumatoid factor 05/09/2014  . Essential hypertension 05/09/2014  . ILD (interstitial lung disease) ([place]) 05/09/2014  . Obstructive apnea 05/09/2014  . Lung nodule, solitary 04/18/2014  . Awaiting organ transplant 04/18/2014  . Acute on chronic respiratory failure with hypoxia ([place]) 08/15/2013  . Edema 07/05/2013  . Chronic respiratory failure ([place]) 03/03/2013  . Diabetes mellitus with complication ([place]) 29/79/8921  . Acute sinusitis 05/04/2011  . Cough 11/10/2010  . 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 Obstructive sleep apnea 10/09/2008  . GERD without esophagitis 10/09/2008  . HIATAL HERNIA 10/09/2008  . OSTEOARTHRITIS 10/09/2008    Past Surgical History:  Procedure Laterality Date  . COLONOSCOPY WITH PROPOFOL N/A 11/07/2017   Procedure: COLONOSCOPY WITH PROPOFOL;  Surgeon: Ronnette Juniper, MD;  Location: [facility];  Service: Gastroenterology;  Laterality: N/A;  . LIGATION OF ARTERIOVENOUS  FISTULA Left 01/14/2019  Procedure: LIGATION OF ARTERIOVENOUS  FISTULA;  Surgeon: Marty Heck, MD;  Location: [facility];  Service: Vascular;  Laterality: Left;  . LUNG BIOPSY  2010  . LUNG TRANSPLANT, SINGLE Right   . POLYPECTOMY  11/07/2017   Procedure: POLYPECTOMY;  Surgeon:  Ronnette Juniper, MD;  Location: [facility];  Service: Gastroenterology;;       Family History  Problem Relation Age of Onset  . Pancreatic cancer Brother   . Heart disease Father     Social History   Tobacco Use  . Smoking status: Former Smoker    Packs/day: 0.30    Years: 20.00    Pack years: 6.00    Types: Cigarettes    Quit date: 06/06/2002    Years since quitting: 18.3  . Smokeless tobacco: Never Used  Vaping Use  . Vaping Use: Never used  Substance Use Topics  . Alcohol use: No    Alcohol/week: 0.0 standard drinks  . Drug use: No    Home Medications Prior to Admission medications   Medication Sig Start Date End Date Taking? Authorizing Provider  ACCU-CHEK FASTCLIX LANCETS MISC "As directed up to 4 times daily" 12/16/15   [provider]  acetaminophen (TYLENOL) 160 MG/5ML suspension Place 649.6 mg into feeding tube every 6 (six) hours as needed for mild pain. 09/15/20   [provider]  albuterol (VENTOLIN HFA) 108 (90 Base) MCG/ACT inhaler Inhale 1-2 puffs into the lungs daily as needed for wheezing or shortness of breath. 05/12/20   [provider]  aspirin 81 MG tablet Take 81 mg by mouth daily with breakfast.    [provider]  CYCLOSPORINE PO Take 125 mg by mouth every 12 (twelve) hours.    [provider]  dorzolamidel-timolol (COSOPT PF) 22.3-6.8 MG/ML SOLN ophthalmic solution Place 1 drop into both eyes 2 (two) times daily.    [provider]  fluticasone (FLONASE) 50 MCG/ACT nasal spray Place 2 sprays into the nose daily. 03/05/13   Wenda Low, MD  insulin regular (NOVOLIN R) 100 units/mL injection Inject into the skin See admin instructions. Inject per sliding scale: if 0-199=0; 200-250=1; 251-300=2; 301-350=3; 351-400=4 and call MD, subcutaneously four times a day. Give in addition to schedule amount.    [provider]  Loperamide HCl 1 MG/7.5ML LIQD Take 15 mLs (2 mg total) by G tube 3 (three) times  daily as needed for Diarrhea 09/15/20   [provider]  midodrine (PROAMATINE) 10 MG tablet Place 10 mg into feeding tube daily as needed (for hypotension prior to PT/OT PRN for BP).    [provider]  midodrine (PROAMATINE) 10 MG tablet Place 10 mg into feeding tube 2 (two) times daily. Give before dialysis    [provider]  Multiple Vitamin (MULTIVITAMIN WITH MINERALS) TABS tablet Place 1 tablet into feeding tube daily. Centrum for Men 50 plus: on Monday, Wednesday, Friday (dialysis days): take one tablet daily with breakfast; on Sunday, Tuesday, Thursday, Saturday: take one tablet daily with supper    [provider]  Nutritional Supplements (FEEDING SUPPLEMENT, NEPRO CARB STEADY,) LIQD Place 1,000 mLs into feeding tube daily. 09/28/20   Fritzi Mandes, MD  omeprazole (FIRST-OMEPRAZOLE) 2 mg/mL SUSP oral suspension Place 40 mg into feeding tube daily.    [provider]  ondansetron (ZOFRAN) 4 MG tablet Place 4 mg into feeding tube every 6 (six) hours as needed for nausea or vomiting.    [provider]  oxyCODONE (ROXICODONE) 5 MG immediate  release tablet Take 1 tablet (5 mg total) by mouth every 4 (four) hours as needed for severe pain. 10/11/20   Tacy Learn, PA-C  pravastatin (PRAVACHOL) 10 MG tablet Place 10 mg into feeding tube at bedtime. 03/16/20   [provider]  predniSONE (DELTASONE) 5 MG tablet Take 1 tablet (5 mg total) by G tube once daily . Crush and administer via GTube daily. 09/16/20 09/16/21  [provider]  sulfamethoxazole-trimethoprim (BACTRIM,SEPTRA) 400-80 MG tablet Place 1 tablet into feeding tube See admin instructions. On Monday, Wednesday, Friday (dialysis days): take one tablet by mouth daily with supper 02/27/17   [provider]  valganciclovir (VALCYTE) 50 MG/ML SOLR Take 450 mg by mouth See admin instructions. Take one tablet (450 mg) by mouth on Monday, Wednesday, and Friday after dialysis  03/26/15   [provider]  vitamin B-6 (PYRIDOXINE) 25 MG tablet Place 25 mg into feeding tube daily with breakfast.    [provider]    Allergies    Cefepime, Levofloxacin, Nsaids, and Tolmetin  Review of Systems   Review of Systems  All other systems reviewed and are negative.   Physical Exam Updated Vital Signs BP (!) 113/59   Pulse 87   Temp 98.5 F (36.9 C) (Oral)   Resp 18   Ht 5' 3.5" (1.613 m)   Wt 60.3 kg   SpO2 100%   BMI 23.18 kg/m   Physical Exam Vitals and nursing note reviewed.  Constitutional:      Appearance: He is well-developed.  HENT:     Head: Normocephalic and atraumatic.     Mouth/Throat:     Mouth: Mucous membranes are moist.  Eyes:     Pupils: Pupils are equal, round, and reactive to light.  Cardiovascular:     Rate and Rhythm: Normal rate.  Pulmonary:     Effort: Pulmonary effort is normal. No respiratory distress.  Abdominal:     General: Abdomen is flat. There is no distension.  Musculoskeletal:        General: Normal range of motion.     Cervical back: Normal range of motion.  Skin:    General: Skin is warm and dry.     Coloration: Skin is not jaundiced or pale.  Neurological:     General: No focal deficit present.     Mental Status: He is alert.     ED Results / Procedures / Treatments   Labs (all labs ordered are listed, but only abnormal results are displayed) Labs Reviewed  CBC WITH DIFFERENTIAL/PLATELET - Abnormal; Notable for the following components:      Result Value   WBC 14.6 (*)    RBC 2.45 (*)    Hemoglobin 7.9 (*)    HCT 25.2 (*)    MCV 102.9 (*)    RDW 19.4 (*)    Platelets 134 (*)    Neutro Abs 11.3 (*)    Lymphs Abs 0.5 (*)    Monocytes Absolute 1.6 (*)    Abs Immature Granulocytes 1.03 (*)    All other components within normal limits  BASIC METABOLIC PANEL - Abnormal; Notable for the following components:   Potassium 2.9 (*)    Glucose, Bld 102 (*)    Creatinine, Ser 2.30 (*)     Calcium 7.3 (*)    GFR, Estimated 30 (*)    All other components within normal limits    EKG None  Radiology No results found.  Procedures Procedures   Medications  Ordered in ED Medications - No data to display  ED Course  I have reviewed the triage vital signs and the nursing notes.  Pertinent labs & imaging results that were available during my care of the patient were reviewed by me and considered in my medical decision making (see chart for details).    MDM Rules/Calculators/A&P                          No focal deficitis. No other obvious causes for low BP. BP's stable for nearly 4 hours here without intervention.   Final Clinical Impression(s) / ED Diagnoses Final diagnoses:  Stage 5 chronic kidney disease on chronic dialysis [facility])    Rx / DC Orders ED Discharge Orders    None       Stevens Magwood, Corene Cornea, MD 10/23/20 2337

## 2020-10-23 NOTE — Care Management (Signed)
ED RNCM spoke with Sophia RN concerning transport home, patient's family is on the way to pick patient up and transport him via private vehicle.

## 2020-10-23 NOTE — ED Notes (Signed)
RN spoke with Molli Posey, pt's wife, and discussed pt d/c. Wife stated that either she or their son will arrive to [facility] ED to pick up pt from lobby.

## 2020-10-23 NOTE — ED Triage Notes (Signed)
Pt arrived via [facility] from home for lethargy after dialysis today. Per EMS pt's bp was 70 palp. Pt responds to voice and oriented to self only. Pt does obey commands. Pt's VSS. Pt is NSR on monitor

## 2020-10-23 NOTE — ED Notes (Signed)
I was unable to get pt to sign MSE due to confusion and no family is present.

## 2020-10-26 DIAGNOSIS — N186 End stage renal disease: Secondary | ICD-10-CM | POA: Diagnosis not present

## 2020-10-26 DIAGNOSIS — N2581 Secondary hyperparathyroidism of renal origin: Secondary | ICD-10-CM | POA: Diagnosis not present

## 2020-10-26 DIAGNOSIS — Z992 Dependence on renal dialysis: Secondary | ICD-10-CM | POA: Diagnosis not present

## 2020-10-26 DIAGNOSIS — D631 Anemia in chronic kidney disease: Secondary | ICD-10-CM | POA: Diagnosis not present

## 2020-10-27 DIAGNOSIS — E46 Unspecified protein-calorie malnutrition: Secondary | ICD-10-CM | POA: Diagnosis not present

## 2020-10-27 DIAGNOSIS — T86819 Unspecified complication of lung transplant: Secondary | ICD-10-CM | POA: Diagnosis not present

## 2020-10-27 DIAGNOSIS — R627 Adult failure to thrive: Secondary | ICD-10-CM | POA: Diagnosis not present

## 2020-10-27 DIAGNOSIS — Z992 Dependence on renal dialysis: Secondary | ICD-10-CM | POA: Diagnosis not present

## 2020-10-27 DIAGNOSIS — N186 End stage renal disease: Secondary | ICD-10-CM | POA: Diagnosis not present

## 2020-10-27 DIAGNOSIS — Z942 Lung transplant status: Secondary | ICD-10-CM | POA: Diagnosis not present

## 2020-10-27 DIAGNOSIS — C3492 Malignant neoplasm of unspecified part of left bronchus or lung: Secondary | ICD-10-CM | POA: Diagnosis not present

## 2020-10-27 DIAGNOSIS — T82858A Stenosis of vascular prosthetic devices, implants and grafts, initial encounter: Secondary | ICD-10-CM | POA: Diagnosis not present

## 2020-10-27 DIAGNOSIS — I871 Compression of vein: Secondary | ICD-10-CM | POA: Diagnosis not present

## 2020-10-28 ENCOUNTER — Emergency Department (HOSPITAL_COMMUNITY): Payer: Medicare Other

## 2020-10-28 ENCOUNTER — Inpatient Hospital Stay (HOSPITAL_COMMUNITY)
Admission: EM | Admit: 2020-10-28 | Discharge: 2020-11-04 | DRG: 871 | Disposition: E | Payer: Medicare Other | Attending: Hospitalist | Admitting: Hospitalist

## 2020-10-28 DIAGNOSIS — I120 Hypertensive chronic kidney disease with stage 5 chronic kidney disease or end stage renal disease: Secondary | ICD-10-CM | POA: Diagnosis present

## 2020-10-28 DIAGNOSIS — D84821 Immunodeficiency due to drugs: Secondary | ICD-10-CM | POA: Diagnosis present

## 2020-10-28 DIAGNOSIS — R6521 Severe sepsis with septic shock: Secondary | ICD-10-CM | POA: Diagnosis present

## 2020-10-28 DIAGNOSIS — J9621 Acute and chronic respiratory failure with hypoxia: Secondary | ICD-10-CM | POA: Diagnosis present

## 2020-10-28 DIAGNOSIS — R0902 Hypoxemia: Secondary | ICD-10-CM | POA: Diagnosis not present

## 2020-10-28 DIAGNOSIS — A0472 Enterocolitis due to Clostridium difficile, not specified as recurrent: Secondary | ICD-10-CM | POA: Diagnosis present

## 2020-10-28 DIAGNOSIS — Z79899 Other long term (current) drug therapy: Secondary | ICD-10-CM

## 2020-10-28 DIAGNOSIS — R531 Weakness: Secondary | ICD-10-CM

## 2020-10-28 DIAGNOSIS — I959 Hypotension, unspecified: Secondary | ICD-10-CM | POA: Diagnosis not present

## 2020-10-28 DIAGNOSIS — R6520 Severe sepsis without septic shock: Secondary | ICD-10-CM | POA: Diagnosis not present

## 2020-10-28 DIAGNOSIS — L8990 Pressure ulcer of unspecified site, unspecified stage: Secondary | ICD-10-CM | POA: Diagnosis present

## 2020-10-28 DIAGNOSIS — G4733 Obstructive sleep apnea (adult) (pediatric): Secondary | ICD-10-CM | POA: Diagnosis present

## 2020-10-28 DIAGNOSIS — N2581 Secondary hyperparathyroidism of renal origin: Secondary | ICD-10-CM | POA: Diagnosis present

## 2020-10-28 DIAGNOSIS — J9811 Atelectasis: Secondary | ICD-10-CM | POA: Diagnosis not present

## 2020-10-28 DIAGNOSIS — E440 Moderate protein-calorie malnutrition: Secondary | ICD-10-CM | POA: Diagnosis present

## 2020-10-28 DIAGNOSIS — E785 Hyperlipidemia, unspecified: Secondary | ICD-10-CM | POA: Diagnosis present

## 2020-10-28 DIAGNOSIS — N186 End stage renal disease: Secondary | ICD-10-CM | POA: Diagnosis present

## 2020-10-28 DIAGNOSIS — M25519 Pain in unspecified shoulder: Secondary | ICD-10-CM | POA: Diagnosis present

## 2020-10-28 DIAGNOSIS — Z8249 Family history of ischemic heart disease and other diseases of the circulatory system: Secondary | ICD-10-CM

## 2020-10-28 DIAGNOSIS — Z992 Dependence on renal dialysis: Secondary | ICD-10-CM

## 2020-10-28 DIAGNOSIS — C3490 Malignant neoplasm of unspecified part of unspecified bronchus or lung: Secondary | ICD-10-CM | POA: Diagnosis present

## 2020-10-28 DIAGNOSIS — Z20822 Contact with and (suspected) exposure to covid-19: Secondary | ICD-10-CM | POA: Diagnosis present

## 2020-10-28 DIAGNOSIS — R571 Hypovolemic shock: Secondary | ICD-10-CM | POA: Diagnosis present

## 2020-10-28 DIAGNOSIS — J9610 Chronic respiratory failure, unspecified whether with hypoxia or hypercapnia: Secondary | ICD-10-CM | POA: Diagnosis present

## 2020-10-28 DIAGNOSIS — Z66 Do not resuscitate: Secondary | ICD-10-CM | POA: Diagnosis present

## 2020-10-28 DIAGNOSIS — R579 Shock, unspecified: Secondary | ICD-10-CM | POA: Diagnosis not present

## 2020-10-28 DIAGNOSIS — D509 Iron deficiency anemia, unspecified: Secondary | ICD-10-CM | POA: Diagnosis present

## 2020-10-28 DIAGNOSIS — J8410 Pulmonary fibrosis, unspecified: Secondary | ICD-10-CM | POA: Diagnosis present

## 2020-10-28 DIAGNOSIS — Z86010 Personal history of colonic polyps: Secondary | ICD-10-CM

## 2020-10-28 DIAGNOSIS — D849 Immunodeficiency, unspecified: Secondary | ICD-10-CM | POA: Diagnosis present

## 2020-10-28 DIAGNOSIS — R21 Rash and other nonspecific skin eruption: Secondary | ICD-10-CM | POA: Diagnosis not present

## 2020-10-28 DIAGNOSIS — Z515 Encounter for palliative care: Secondary | ICD-10-CM

## 2020-10-28 DIAGNOSIS — E872 Acidosis, unspecified: Secondary | ICD-10-CM | POA: Diagnosis present

## 2020-10-28 DIAGNOSIS — Z789 Other specified health status: Secondary | ICD-10-CM | POA: Diagnosis present

## 2020-10-28 DIAGNOSIS — E118 Type 2 diabetes mellitus with unspecified complications: Secondary | ICD-10-CM | POA: Diagnosis present

## 2020-10-28 DIAGNOSIS — I70219 Atherosclerosis of native arteries of extremities with intermittent claudication, unspecified extremity: Secondary | ICD-10-CM | POA: Diagnosis present

## 2020-10-28 DIAGNOSIS — A419 Sepsis, unspecified organism: Secondary | ICD-10-CM | POA: Diagnosis present

## 2020-10-28 DIAGNOSIS — I4510 Unspecified right bundle-branch block: Secondary | ICD-10-CM | POA: Diagnosis not present

## 2020-10-28 DIAGNOSIS — R918 Other nonspecific abnormal finding of lung field: Secondary | ICD-10-CM | POA: Diagnosis present

## 2020-10-28 DIAGNOSIS — G9340 Encephalopathy, unspecified: Secondary | ICD-10-CM | POA: Diagnosis not present

## 2020-10-28 DIAGNOSIS — Z800 Family history of malignant neoplasm of digestive organs: Secondary | ICD-10-CM

## 2020-10-28 DIAGNOSIS — R509 Fever, unspecified: Secondary | ICD-10-CM | POA: Diagnosis not present

## 2020-10-28 DIAGNOSIS — L89892 Pressure ulcer of other site, stage 2: Secondary | ICD-10-CM | POA: Diagnosis present

## 2020-10-28 DIAGNOSIS — J849 Interstitial pulmonary disease, unspecified: Secondary | ICD-10-CM | POA: Diagnosis present

## 2020-10-28 DIAGNOSIS — D649 Anemia, unspecified: Secondary | ICD-10-CM | POA: Diagnosis not present

## 2020-10-28 DIAGNOSIS — K7460 Unspecified cirrhosis of liver: Secondary | ICD-10-CM | POA: Diagnosis present

## 2020-10-28 DIAGNOSIS — Z942 Lung transplant status: Secondary | ICD-10-CM | POA: Diagnosis not present

## 2020-10-28 DIAGNOSIS — E1122 Type 2 diabetes mellitus with diabetic chronic kidney disease: Secondary | ICD-10-CM | POA: Diagnosis present

## 2020-10-28 DIAGNOSIS — E1151 Type 2 diabetes mellitus with diabetic peripheral angiopathy without gangrene: Secondary | ICD-10-CM | POA: Diagnosis present

## 2020-10-28 DIAGNOSIS — Z7952 Long term (current) use of systemic steroids: Secondary | ICD-10-CM

## 2020-10-28 DIAGNOSIS — K449 Diaphragmatic hernia without obstruction or gangrene: Secondary | ICD-10-CM | POA: Diagnosis present

## 2020-10-28 DIAGNOSIS — Z87891 Personal history of nicotine dependence: Secondary | ICD-10-CM

## 2020-10-28 DIAGNOSIS — Z6823 Body mass index (BMI) 23.0-23.9, adult: Secondary | ICD-10-CM

## 2020-10-28 DIAGNOSIS — C799 Secondary malignant neoplasm of unspecified site: Secondary | ICD-10-CM | POA: Diagnosis present

## 2020-10-28 DIAGNOSIS — K219 Gastro-esophageal reflux disease without esophagitis: Secondary | ICD-10-CM | POA: Diagnosis present

## 2020-10-28 DIAGNOSIS — R000 Tachycardia, unspecified: Secondary | ICD-10-CM | POA: Diagnosis not present

## 2020-10-28 LAB — COMPREHENSIVE METABOLIC PANEL
ALT: 21 U/L (ref 0–44)
AST: 40 U/L (ref 15–41)
Albumin: 1.4 g/dL — ABNORMAL LOW (ref 3.5–5.0)
Alkaline Phosphatase: 537 U/L — ABNORMAL HIGH (ref 38–126)
Anion gap: 15 (ref 5–15)
BUN: 33 mg/dL — ABNORMAL HIGH (ref 8–23)
CO2: 25 mmol/L (ref 22–32)
Calcium: 7.3 mg/dL — ABNORMAL LOW (ref 8.9–10.3)
Chloride: 99 mmol/L (ref 98–111)
Creatinine, Ser: 5.59 mg/dL — ABNORMAL HIGH (ref 0.61–1.24)
GFR, Estimated: 10 mL/min — ABNORMAL LOW (ref >60)
Glucose, Bld: 207 mg/dL — ABNORMAL HIGH (ref 70–99)
Potassium: 3 mmol/L — ABNORMAL LOW (ref 3.5–5.1)
Sodium: 139 mmol/L (ref 135–145)
Total Bilirubin: 2.8 mg/dL — ABNORMAL HIGH (ref 0.3–1.2)
Total Protein: 4.8 g/dL — ABNORMAL LOW (ref 6.5–8.1)

## 2020-10-28 LAB — POC OCCULT BLOOD, ED: Fecal Occult Bld: NEGATIVE

## 2020-10-28 LAB — CBC WITH DIFFERENTIAL/PLATELET
Abs Immature Granulocytes: 0.29 10*3/uL — ABNORMAL HIGH (ref 0.00–0.07)
Basophils Absolute: 0 10*3/uL (ref 0.0–0.1)
Basophils Relative: 0 %
Eosinophils Absolute: 0.1 10*3/uL (ref 0.0–0.5)
Eosinophils Relative: 0 %
HCT: 19.1 % — ABNORMAL LOW (ref 39.0–52.0)
Hemoglobin: 5.9 g/dL — CL (ref 13.0–17.0)
Immature Granulocytes: 2 %
Lymphocytes Relative: 3 %
Lymphs Abs: 0.5 10*3/uL — ABNORMAL LOW (ref 0.7–4.0)
MCH: 32.2 pg (ref 26.0–34.0)
MCHC: 30.9 g/dL (ref 30.0–36.0)
MCV: 104.4 fL — ABNORMAL HIGH (ref 80.0–100.0)
Monocytes Absolute: 1.8 10*3/uL — ABNORMAL HIGH (ref 0.1–1.0)
Monocytes Relative: 9 %
Neutro Abs: 17.4 10*3/uL — ABNORMAL HIGH (ref 1.7–7.7)
Neutrophils Relative %: 86 %
Platelets: 131 10*3/uL — ABNORMAL LOW (ref 150–400)
RBC: 1.83 MIL/uL — ABNORMAL LOW (ref 4.22–5.81)
RDW: 19.6 % — ABNORMAL HIGH (ref 11.5–15.5)
WBC: 20 10*3/uL — ABNORMAL HIGH (ref 4.0–10.5)
nRBC: 0 % (ref 0.0–0.2)

## 2020-10-28 LAB — RESP PANEL BY RT-PCR (FLU A&B, COVID) ARPGX2
Influenza A by PCR: NEGATIVE
Influenza B by PCR: NEGATIVE
SARS Coronavirus 2 by RT PCR: NEGATIVE

## 2020-10-28 LAB — LACTIC ACID, PLASMA: Lactic Acid, Venous: 2 mmol/L (ref 0.5–1.9)

## 2020-10-28 LAB — GLUCOSE, CAPILLARY: Glucose-Capillary: 194 mg/dL — ABNORMAL HIGH (ref 70–99)

## 2020-10-28 MED ORDER — GLYCOPYRROLATE 0.2 MG/ML IJ SOLN: 0.2000 mg | INTRAMUSCULAR | Status: DC | PRN

## 2020-10-28 MED ORDER — ACETAMINOPHEN 325 MG PO TABS: 650.0000 mg | ORAL_TABLET | Freq: Four times a day (QID) | ORAL | Status: DC | PRN

## 2020-10-28 MED ORDER — VALGANCICLOVIR HCL 50 MG/ML PO SOLR: 450.0000 mg | ORAL | Status: DC

## 2020-10-28 MED ORDER — ONDANSETRON HCL 4 MG/2ML IJ SOLN: 4.0000 mg | Freq: Four times a day (QID) | INTRAMUSCULAR | Status: DC | PRN

## 2020-10-28 MED ORDER — LORAZEPAM 2 MG/ML IJ SOLN
2.0000 mg | INTRAMUSCULAR | Status: DC | PRN
Start: 2020-10-28 — End: 2020-10-30

## 2020-10-28 MED ORDER — MIDODRINE HCL 5 MG PO TABS
10.0000 mg | ORAL_TABLET | Freq: Two times a day (BID) | ORAL | Status: DC
Start: 2020-10-28 — End: 2020-10-28

## 2020-10-28 MED ORDER — DOCUSATE SODIUM 100 MG PO CAPS: 100.0000 mg | ORAL_CAPSULE | Freq: Two times a day (BID) | ORAL | Status: DC | PRN

## 2020-10-28 MED ORDER — GLYCOPYRROLATE 1 MG PO TABS
1.0000 mg | ORAL_TABLET | ORAL | Status: DC | PRN
  Filled 2020-10-28: qty 1

## 2020-10-28 MED ORDER — POLYETHYLENE GLYCOL 3350 17 G PO PACK: 17.0000 g | PACK | Freq: Every day | ORAL | Status: DC | PRN

## 2020-10-28 MED ORDER — SODIUM CHLORIDE 0.9 % IV BOLUS
500.0000 mL | Freq: Once | INTRAVENOUS | Status: AC
  Administered 2020-10-28: 500 mL via INTRAVENOUS

## 2020-10-28 MED ORDER — MORPHINE SULFATE (PF) 2 MG/ML IV SOLN
2.0000 mg | INTRAVENOUS | Status: DC | PRN
  Administered 2020-10-29 (×3): 2 mg via INTRAVENOUS
  Filled 2020-10-28 (×3): qty 1

## 2020-10-28 MED ORDER — LOPERAMIDE HCL 2 MG PO CAPS: 4.0000 mg | ORAL_CAPSULE | ORAL | Status: DC | PRN

## 2020-10-28 MED ORDER — ACETAMINOPHEN 650 MG RE SUPP: 650.0000 mg | Freq: Four times a day (QID) | RECTAL | Status: DC | PRN

## 2020-10-28 MED ORDER — HYDROCORTISONE NA SUCCINATE PF 100 MG IJ SOLR: 100.0000 mg | Freq: Three times a day (TID) | INTRAMUSCULAR | Status: DC

## 2020-10-28 MED ORDER — DIPHENHYDRAMINE HCL 50 MG/ML IJ SOLN: 25.0000 mg | INTRAMUSCULAR | Status: DC | PRN

## 2020-10-28 MED ORDER — SULFAMETHOXAZOLE-TRIMETHOPRIM 400-80 MG PO TABS: 1.0000 | ORAL_TABLET | ORAL | Status: DC

## 2020-10-28 MED ORDER — SODIUM CHLORIDE 0.9% IV SOLUTION: Freq: Once | INTRAVENOUS | Status: AC

## 2020-10-28 MED ORDER — VANCOMYCIN HCL 1250 MG/250ML IV SOLN
1250.0000 mg | Freq: Once | INTRAVENOUS | Status: DC
  Filled 2020-10-28: qty 250

## 2020-10-28 MED ORDER — POLYVINYL ALCOHOL 1.4 % OP SOLN
1.0000 [drp] | Freq: Four times a day (QID) | OPHTHALMIC | Status: DC | PRN
  Filled 2020-10-28: qty 15

## 2020-10-28 NOTE — ED Notes (Addendum)
MD at bedside to 9ut in line

## 2020-10-28 NOTE — ED Notes (Signed)
RN notified MD about pts BP

## 2020-10-28 NOTE — ED Notes (Signed)
RN called IV team.

## 2020-10-28 NOTE — ED Notes (Signed)
Family at bedside. 

## 2020-10-28 NOTE — ED Notes (Signed)
Whitney NP advised RN that the family would like to make pt palliative. Family would not like any medication at this time. Family would just like normal saline.

## 2020-10-28 NOTE — ED Notes (Signed)
IV team at bedside 

## 2020-10-28 NOTE — ED Triage Notes (Addendum)
Pt BIB EMS from dialysis due to hypotension and fever. EMS states BOP was 70/38 and T- 103 orally. Pt received tylenol. Pt axox4. Pt did not receive any dialysis today.

## 2020-10-28 NOTE — ED Provider Notes (Signed)
[facility] EMERGENCY DEPARTMENT Provider Note   CSN: [id number] Arrival date & time: 10/22/2020  1431     History Chief Complaint  Patient presents with  . Hypotension    [name] is a 70 y.o. male.  HPI   69 year old male with past medical history of end-stage renal disease on hemodialysis presents the emergency department from dialysis session for concern of fever and hypotension.  Patient was reportedly febrile up to 103, given Tylenol prior to arrival.  3 arrival blood pressure was reportedly 60 systolic.  Patient is a poor historian, altered.  Only complaining of shoulder pain and diarrhea.  Otherwise history is significantly limited at this time.  Past Medical History:  Diagnosis Date  . Aortic valve disorders   . Benign neoplasm of colon   . Degeneration of intervertebral disc, site unspecified   . Diabetes mellitus without complication ([place])   . 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 Esophageal reflux   . ESRD (end stage renal disease) on dialysis ([place])   . Essential hypertension   . Obstructive sleep apnea (adult) (pediatric)   . Osteoarthrosis, unspecified whether generalized or localized, unspecified site   . Other and unspecified hyperlipidemia   . Pneumonia    interstitial pneumonia  . Pulmonary fibrosis ([place])   . Renal disorder   . Respiratory failure with hypoxia ([place]) 12/2015  . Transplanted, lung ([place])   . Unspecified essential hypertension     Patient Active Problem List   Diagnosis Date Noted  . Anemia 09/26/2020  . Exercise hypoxemia   . Pneumonia 07/17/2020  . Lung mass 07/17/2020  . Acute bronchiolitis due to human metapneumovirus 04/25/2020  . Sepsis with acute organ dysfunction ([place]) 01/08/2020  . Lactic acidosis 01/08/2020  . Mixed diabetic hyperlipidemia associated with type 2 diabetes mellitus ([place]) 01/08/2020  . Hepatic cirrhosis ([place]) 01/08/2020  . Uncontrolled type 2 diabetes  mellitus with hyperglycemia, with long-term current use of insulin ([place]) 01/08/2020  . IV infiltrate, initial encounter 01/08/2020  . Acute diarrhea 01/08/2020  . Acute metabolic encephalopathy 02/58/5277  . 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 AV fistula infection, initial encounter ([place]) 01/14/2019  . Immunocompromised state ([place]) 01/14/2019  . Wound infection 01/13/2019  . Sepsis ([place])   . CKD (chronic kidney disease)   . Generalized weakness 12/30/2015  . ESRD (end stage renal disease) ([place])   . 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 Acute respiratory failure with hypoxemia ([place]) 04/18/2015  . Septic shock ([place]) 04/18/2015  . Acute encephalopathy 04/18/2015  . Acute respiratory failure with hypoxia ([place]) 04/18/2015  . Cardiac arrest ([place])   . HCAP (healthcare-associated pneumonia)   . Elevated rheumatoid factor 05/09/2014  . Essential hypertension 05/09/2014  . ILD (interstitial lung disease) ([place]) 05/09/2014  . Obstructive apnea 05/09/2014  . Lung nodule, solitary 04/18/2014  . Awaiting organ transplant 04/18/2014  . Acute on chronic respiratory failure with hypoxia ([place]) 08/15/2013  . Edema 07/05/2013  . Chronic respiratory failure ([place]) 03/03/2013  . Diabetes mellitus with complication ([place]) 82/42/3536  . Acute sinusitis 05/04/2011  . Cough 11/10/2010  . 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 Obstructive sleep apnea 10/09/2008  . GERD without esophagitis 10/09/2008  . HIATAL HERNIA 10/09/2008  . OSTEOARTHRITIS 10/09/2008    Past Surgical History:  Procedure Laterality Date  . COLONOSCOPY WITH PROPOFOL N/A 11/07/2017   Procedure: COLONOSCOPY WITH PROPOFOL;  Surgeon: Ronnette Juniper, MD;  Location: [facility];  Service: Gastroenterology;  Laterality: N/A;  . LIGATION OF ARTERIOVENOUS  FISTULA Left 01/14/2019   Procedure: LIGATION OF ARTERIOVENOUS  FISTULA;  Surgeon: Marty Heck, MD;  Location: [facility];  Service: Vascular;  Laterality: Left;  .  LUNG BIOPSY  2010  . LUNG TRANSPLANT, SINGLE Right   . POLYPECTOMY  11/07/2017   Procedure: POLYPECTOMY;  Surgeon: Ronnette Juniper, MD;  Location: [facility];  Service: Gastroenterology;;       Family History  Problem Relation Age of Onset  . Pancreatic cancer Brother   . Heart disease Father     Social History   Tobacco Use  . Smoking status: Former Smoker    Packs/day: 0.30    Years: 20.00    Pack years: 6.00    Types: Cigarettes    Quit date: 06/06/2002    Years since quitting: 18.4  . Smokeless tobacco: Never Used  Vaping Use  . Vaping Use: Never used  Substance Use Topics  . Alcohol use: No    Alcohol/week: 0.0 standard drinks  . Drug use: No    Home Medications Prior to Admission medications   Medication Sig Start Date End Date Taking? Authorizing Provider  ACCU-CHEK FASTCLIX LANCETS MISC "As directed up to 4 times daily" 12/16/15   [provider]  acetaminophen (TYLENOL) 160 MG/5ML suspension Place 649.6 mg into feeding tube every 6 (six) hours as needed for mild pain. 09/15/20   [provider]  albuterol (VENTOLIN HFA) 108 (90 Base) MCG/ACT inhaler Inhale 1-2 puffs into the lungs daily as needed for wheezing or shortness of breath. 05/12/20   [provider]  aspirin 81 MG tablet Take 81 mg by mouth daily with breakfast.    [provider]  CYCLOSPORINE PO Take 125 mg by mouth every 12 (twelve) hours.    [provider]  dorzolamidel-timolol (COSOPT PF) 22.3-6.8 MG/ML SOLN ophthalmic solution Place 1 drop into both eyes 2 (two) times daily.    [provider]  fluticasone (FLONASE) 50 MCG/ACT nasal spray Place 2 sprays into the nose daily. 03/05/13   Wenda Low, MD  insulin regular (NOVOLIN R) 100 units/mL injection Inject into the skin See admin instructions. Inject per sliding scale: if 0-199=0; 200-250=1; 251-300=2; 301-350=3; 351-400=4 and call MD, subcutaneously four times a day. Give in addition to schedule  amount.    [provider]  Loperamide HCl 1 MG/7.5ML LIQD Take 15 mLs (2 mg total) by G tube 3 (three) times daily as needed for Diarrhea 09/15/20   [provider]  midodrine (PROAMATINE) 10 MG tablet Place 10 mg into feeding tube daily as needed (for hypotension prior to PT/OT PRN for BP).    [provider]  midodrine (PROAMATINE) 10 MG tablet Place 10 mg into feeding tube 2 (two) times daily. Give before dialysis    [provider]  Multiple Vitamin (MULTIVITAMIN WITH MINERALS) TABS tablet Place 1 tablet into feeding tube daily. Centrum for Men 50 plus: on Monday, Wednesday, Friday (dialysis days): take one tablet daily with breakfast; on Sunday, Tuesday, Thursday, Saturday: take one tablet daily with supper    [provider]  Nutritional Supplements (FEEDING SUPPLEMENT, NEPRO CARB STEADY,) LIQD Place 1,000 mLs into feeding tube daily. 09/28/20   Fritzi Mandes, MD  omeprazole (FIRST-OMEPRAZOLE) 2 mg/mL SUSP oral suspension Place 40 mg into feeding tube daily.    [provider]  ondansetron (ZOFRAN) 4 MG tablet Place 4 mg into feeding tube every 6 (six) hours as needed for nausea or  vomiting.    [provider]  oxyCODONE (ROXICODONE) 5 MG immediate release tablet Take 1 tablet (5 mg total) by mouth every 4 (four) hours as needed for severe pain. 10/11/20   Tacy Learn, PA-C  pravastatin (PRAVACHOL) 10 MG tablet Place 10 mg into feeding tube at bedtime. 03/16/20   [provider]  predniSONE (DELTASONE) 5 MG tablet Take 1 tablet (5 mg total) by G tube once daily . Crush and administer via GTube daily. 09/16/20 09/16/21  [provider]  sulfamethoxazole-trimethoprim (BACTRIM,SEPTRA) 400-80 MG tablet Place 1 tablet into feeding tube See admin instructions. On Monday, Wednesday, Friday (dialysis days): take one tablet by mouth daily with supper 02/27/17   [provider]  valganciclovir (VALCYTE) 50 MG/ML SOLR Take  450 mg by mouth See admin instructions. Take one tablet (450 mg) by mouth on Monday, Wednesday, and Friday after dialysis 03/26/15   [provider]  vitamin B-6 (PYRIDOXINE) 25 MG tablet Place 25 mg into feeding tube daily with breakfast.    [provider]    Allergies    Cefepime, Levofloxacin, Nsaids, and Tolmetin  Review of Systems   Review of Systems  Unable to perform ROS: Mental status change    Physical Exam Updated Vital Signs BP (!) 104/44 (BP Location: Right Arm)   Pulse (!) 57   Temp 98.3 F (36.8 C) (Oral)   Resp 20   SpO2 100%   Physical Exam Vitals and nursing note reviewed.  Constitutional:      Appearance: Normal appearance.  HENT:     Head: Normocephalic.     Mouth/Throat:     Mouth: Mucous membranes are moist.  Cardiovascular:     Rate and Rhythm: Normal rate.  Pulmonary:     Comments: Diffuse rales in the left lung fields Abdominal:     Palpations: Abdomen is soft.     Tenderness: There is no abdominal tenderness.  Musculoskeletal:     Comments: LUE AV fistula  Skin:    General: Skin is warm.  Neurological:     Mental Status: He is alert.     Comments: Oriented to self, drowsy, easily arousable, follows commands  Psychiatric:        Mood and Affect: Mood normal.     ED Results / Procedures / Treatments   Labs (all labs ordered are listed, but only abnormal results are displayed) Labs Reviewed  CBC WITH DIFFERENTIAL/PLATELET - Abnormal; Notable for the following components:      Result Value   WBC 20.0 (*)    RBC 1.83 (*)    Hemoglobin 5.9 (*)    HCT 19.1 (*)    MCV 104.4 (*)    RDW 19.6 (*)    Platelets 131 (*)    Neutro Abs 17.4 (*)    Lymphs Abs 0.5 (*)    Monocytes Absolute 1.8 (*)    Abs Immature Granulocytes 0.29 (*)    All other components within normal limits  LACTIC ACID, PLASMA - Abnormal; Notable for the following components:   Lactic Acid, Venous 2.0 (*)    All other components within normal limits   CULTURE, BLOOD (ROUTINE X 2)  CULTURE, BLOOD (ROUTINE X 2)  RESP PANEL BY RT-PCR (FLU A&B, COVID) ARPGX2  STOOL CULTURE  C DIFFICILE QUICK SCREEN W PCR REFLEX  LACTIC ACID, PLASMA  COMPREHENSIVE METABOLIC PANEL  SODIUM, STOOL  OSMOLALITY, STOOL  FECAL FAT, QUALITATIVE  POC OCCULT BLOOD, ED  TYPE AND SCREEN  PREPARE  RBC (CROSSMATCH)    EKG EKG Interpretation  Date/Time:  Wednesday Oct 28 2020 14:35:10 EDT Ventricular Rate:  102 PR Interval:  131 QRS Duration: 135 QT Interval:  386 QTC Calculation: 503 R Axis:   -19 Text Interpretation: Sinus tachycardia Right bundle branch block sinus tachycardia, 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 EXAM: PORTABLE CHEST 1 VIEW COMPARISON:  Chest radiograph dated 10/14/2020. FINDINGS: Diffuse pulmonary opacity involving the left lung similar to prior radiograph. Minimal right lung base atelectasis and faint streaky density in the right mid lung field, and change. Small left pleural effusion or pleural thickening. No pneumothorax. Stable cardiomediastinal silhouette. Atherosclerotic calcification of the aorta. Vascular stent noted in the upper mediastinum and left axilla. No acute osseous pathology. IMPRESSION: No significant interval change in the appearance of the lungs compared to the prior radiograph. Electronically Signed   By: Anner Crete M.D.   On: 10/05/2020 16:07    Procedures .Critical Care Performed by: Lorelle Gibbs, DO Authorized by: Lorelle Gibbs, DO   Critical care provider statement:    Critical care time (minutes):  45   Critical care was time spent personally by me on the following activities:  Discussions with consultants, evaluation of patient's response to treatment, examination of patient, ordering and performing treatments and interventions, ordering and review of  laboratory studies, ordering and review of radiographic studies, pulse oximetry, re-evaluation of patient's condition, obtaining history from patient or surrogate and review of old charts   I assumed direction of critical care for this patient from another provider in my specialty: no       Medications Ordered in ED Medications  docusate sodium (COLACE) capsule 100 mg (has no administration in time range)  polyethylene glycol (MIRALAX / GLYCOLAX) packet 17 g (has no administration in time range)  0.9 %  sodium chloride infusion (Manually program via Guardrails IV Fluids) (has no administration in time range)  ondansetron (ZOFRAN) injection 4 mg (has no administration in time range)  sodium chloride 0.9 % bolus 500 mL (0 mLs Intravenous Stopped 10/25/2020 1614)    ED Course  I have reviewed the triage vital signs and the nursing notes.  Pertinent labs & imaging results that were available during my care of the patient were reviewed by me and considered in my medical decision making (see chart for details).    MDM Rules/Calculators/A&P                          70 year old male presents the emergency department with reported fever and concern of hypotension.  Patient did not receive his dialysis today.  He arrives with systolics in the 37C, drowsy and altered.  IV team was able to establish a 22 in the right forearm.  He has a documented right IJ DVT from previous admission.  Of note patient was recently admitted for septic shock, unknown source.  Treated with pressors and antibiotics, he improved and was discharged.  Chest x-ray shows worsening consolidation in the left lung field, unclear if this is pneumonia versus fluid and predialysis.  Blood work shows a leukocytosis of 20, hemoglobin of 5.9 down from a baseline of 8.  Lactic acid and chemistry is pending.  Fecal occult was sent, type and screen was sent.  ICU was consulted, they will admit the patient.  After  500 cc of fluid the  patient's blood pressure is improved to 927 systolic with better MAP.  His mentation has improved.  He now admits to a couple days of watery diarrhea, denies any visible blood.  Patient will be admitted to the ICU.  Stable at time of admission.  Final Clinical Impression(s) / ED Diagnoses Final diagnoses:  None    Rx / DC Orders ED Discharge Orders    None       Lorelle Gibbs, DO 10/23/2020 1635

## 2020-10-28 NOTE — H&P (Signed)
NAME:  [name], MRN:  [id number], DOB:  [birth year], LOS: 0 ADMISSION DATE:  10/22/2020, CONSULTATION DATE: 10/21/2020 REFERRING MD: Dr. Dina Rich, ED, CHIEF COMPLAINT: Shock  History of Present Illness:  70 year old man with a history of right lung transplant (2016) on immunosuppression that includes prednisone, cyclosporine.  He is on Bactrim and valganciclovir prophylaxis.  He has end-stage renal disease on hemodialysis Monday Wednesday Friday.  Also diagnosed with a non-small cell lung cancer in his native lung post XRT at [facility], with a prolonged hospital course such that he underwent GJ tube placement for nutritional support.  Last seen in transplant clinic 10/20/2020.  CT chest 10/20/2020 at [facility] reviewed, showed interstitial disease in the native lung with a decrease in size of left upper lobe mass compared with priors, innumerable new/increased B pulmonary nodules including throughout the transplanted right lung, trace left pleural effusion, 9 mm soft tissue nodule in the left chest wall, new hepatic lesion   He reports progressive weakness, poor p.o. intake and inability to tolerate tube feeds, diarrhea for 3 to 4 days.  He went to hemodialysis 5/25, was found to have a temperature of 160 and systolic blood pressure in the 50s.  He was directed immediately to the emergency department.  IV fluid resuscitation initiated.  Show labs show lactate 2.0, hemoglobin 5.9 with a leukocytosis, chemistries are pending.  COVID19 and flu negative   Pertinent  Medical History   Past Medical History:  Diagnosis Date  . Aortic valve disorders   . Benign neoplasm of colon   . Degeneration of intervertebral disc, site unspecified   . Diabetes mellitus without complication ([place])   . 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 Esophageal reflux   . ESRD (end stage renal disease) on dialysis ([place])   . Essential hypertension   . Obstructive sleep apnea (adult) (pediatric)   .  Osteoarthrosis, unspecified whether generalized or localized, unspecified site   . Other and unspecified hyperlipidemia   . Pneumonia    interstitial pneumonia  . Pulmonary fibrosis ([place])   . Renal disorder   . Respiratory failure with hypoxia ([place]) 12/2015  . Transplanted, lung ([place])   . Unspecified essential hypertension      Significant Hospital Events: Including procedures, antibiotic start and stop dates in addition to other pertinent events   . CT chest from [facility] 5/17  Interim History / Subjective:  Feels weak Has pain in his shoulders, L > R Has had a dry cough.  Has had diarrhea for 3 to 4 days and states that he feels like he may still need to have a bowel movement   Objective   Blood pressure (!) 104/44, pulse (!) 57, temperature 98.3 F (36.8 C), temperature source Oral, resp. rate 20, SpO2 100 %.        Intake/Output Summary (Last 24 hours) at 10/11/2020 1626 Last data filed at 10/09/2020 1614 Gross per 24 hour  Intake 500 ml  Output --  Net 500 ml   There were no vitals filed for this visit.  Examination: General: Weak acute and chronically ill-appearing man, laying in bed in no distress. HENT: Oropharynx dry, no upper airway noise, no secretions, somewhat weak voice Lungs: Very coarse on the left, more clear on the right, no wheezes or crackles Cardiovascular: Regular, tachycardic 110, distant, no murmur Abdomen: Soft, nondistended, positive bowel sounds.  PEG in place.  Site clean and dry.  There are some brown gastric secretions in the tubing  Extremities: No significant edema Neuro: Awake, globally weak, somewhat lethargic but does wake up and answer questions appropriately, well oriented   Labs/imaging that I havepersonally reviewed  (right click and "Reselect all SmartList Selections" daily)  All labs and studies reviewed 5/25  CXR today > interstitial infiltrates on the left, small left pleural effusion and pleural thickening mild right basilar  atelectasis. Similar to prior.   Resolved Hospital Problem list     Assessment & Plan:   Severe Sepsis, possible septic shock depending on his volume responsiveness.  Receiving IV fluid resuscitation currently -Empiric antibiotics > vanco + cefepime -Obtain blood, respiratory cultures if able -follow lactate, Pct -Given abnormal CT from 1 week ago need to consider opportunistic infection.  He may need repeat CT chest, culture data, possible bronchoscopy depending on results.  End-stage renal disease on hemodialysis.  Missed his HD on 5/25 due to hypotension.  -Follow-up stat BMP.  Need to determine whether he has an urgent indication for HD.  If so then he may require HD catheter placement to facilitate CVVH given his hemodynamic instability.  If we can do for HD and volume resuscitate, may be able to do intermittent HD 5/26  Status post lung transplant on chronic immunosuppression. -Hold cyclosporine and check level -Hold prednisone in lieu of stress dose steroids -Continue Bactrim MWF -Continue valganciclovir per home regimen  Non-small cell lung cancer, now presumed stage IV based on CT chest from [facility] 10/20/2020 -Based on his discussions with transplant clinic recently, likely will not want to pursue any further therapy.  We will need to clarify  Hx HTN -hold all antihypertensives  Acute on chronic diarrhea. ? Infectious, ? Related to TF -Check stool O&P, viral studies  Shoulder pain.  -May need imaging if persists.   Nutrition -Takes nutrition p.o., does not use his PEG tube.  May need to reconsider based on poor p.o. intake.  We will discuss this hospitalization  Best practice (right click and "Reselect all SmartList Selections" daily)  Diet:  Oral Pain/Anxiety/Delirium protocol (if indicated): No VAP protocol (if indicated): Not indicated DVT prophylaxis: Subcutaneous Heparin GI prophylaxis: PPI Glucose control:  SSI No Central venous access:  N/A Arterial line:   N/A Foley:  N/A Mobility:  bed rest  PT consulted: N/A Last date of multidisciplinary goals of care discussion [Plan to discuss goals with him and wife on 5/25] Code Status:  full code Disposition: to ICU  Labs   CBC: Recent Labs  Lab 10/23/20 2000 10/12/2020 1450  WBC 14.6* 20.0*  NEUTROABS 11.3* 17.4*  HGB 7.9* 5.9*  HCT 25.2* 19.1*  MCV 102.9* 104.4*  PLT 134* 131*    Basic Metabolic Panel: Recent Labs  Lab 10/23/20 2000  NA 139  K 2.9*  CL 99  CO2 31  GLUCOSE 102*  BUN 10  CREATININE 2.30*  CALCIUM 7.3*   GFR: Estimated Creatinine Clearance: 24.2 mL/min (A) (by C-G formula based on SCr of 2.3 mg/dL (H)). Recent Labs  Lab 10/23/20 2000 10/07/2020 1450  WBC 14.6* 20.0*    Liver Function Tests: No results for input(s): AST, ALT, ALKPHOS, BILITOT, PROT, ALBUMIN in the last 168 hours. No results for input(s): LIPASE, AMYLASE in the last 168 hours. No results for input(s): AMMONIA in the last 168 hours.  ABG    Component Value Date/Time   PHART 7.381 01/01/2016 1843   PCO2ART 42.4 01/01/2016 1843   PO2ART 78.0 (L) 01/01/2016 1843   HCO3 33.4 (H) 10/12/2020 2103   TCO2  31 10/12/2020 2119   ACIDBASEDEF 2.0 12/30/2015 0841   O2SAT 96.0 10/12/2020 2103     Coagulation Profile: No results for input(s): INR, PROTIME in the last 168 hours.  Cardiac Enzymes: No results for input(s): CKTOTAL, CKMB, CKMBINDEX, TROPONINI in the last 168 hours.  HbA1C: Hgb A1c MFr Bld  Date/Time Value Ref Range Status  10/13/2020 02:08 AM 5.2 4.8 - 5.6 % Final    Comment:    (NOTE) Pre diabetes:          5.7%-6.4%  Diabetes:              >6.4%  Glycemic control for   <7.0% adults with diabetes   07/18/2020 05:53 AM 5.4 4.8 - 5.6 % Final    Comment:    (NOTE) Pre diabetes:          5.7%-6.4%  Diabetes:              >6.4%  Glycemic control for   <7.0% adults with diabetes     CBG: No results for input(s): GLUCAP in the last 168 hours.  Review of Systems:    As per HPI  Past Medical History:  He,  has a past medical history of Aortic valve disorders, Benign neoplasm of colon, Degeneration of intervertebral disc, site unspecified, Diabetes mellitus without complication ([place]), Diaphragmatic hernia without mention of obstruction or gangrene, Esophageal reflux, ESRD (end stage renal disease) on dialysis [facility]), Essential hypertension, Obstructive sleep apnea (adult) (pediatric), Osteoarthrosis, unspecified whether generalized or localized, unspecified site, Other and unspecified hyperlipidemia, Pneumonia, Pulmonary fibrosis ([place]), Renal disorder, Respiratory failure with hypoxia ([place]) (12/2015), Transplanted, lung ([place]), and Unspecified essential hypertension.   Surgical History:   Past Surgical History:  Procedure Laterality Date  . COLONOSCOPY WITH PROPOFOL N/A 11/07/2017   Procedure: COLONOSCOPY WITH PROPOFOL;  Surgeon: Ronnette Juniper, MD;  Location: [facility];  Service: Gastroenterology;  Laterality: N/A;  . LIGATION OF ARTERIOVENOUS  FISTULA Left 01/14/2019   Procedure: LIGATION OF ARTERIOVENOUS  FISTULA;  Surgeon: Marty Heck, MD;  Location: [facility];  Service: Vascular;  Laterality: Left;  . LUNG BIOPSY  2010  . LUNG TRANSPLANT, SINGLE Right   . POLYPECTOMY  11/07/2017   Procedure: POLYPECTOMY;  Surgeon: Ronnette Juniper, MD;  Location: [facility];  Service: Gastroenterology;;     Social History:   reports that he quit smoking about 18 years ago. His smoking use included cigarettes. He has a 6.00 pack-year smoking history. He has never used smokeless tobacco. He reports that he does not drink alcohol and does not use drugs.   Family History:  His family history includes Heart disease in his father; Pancreatic cancer in his brother.   Allergies Allergies  Allergen Reactions  . Cefepime Other (See Comments)    Altered mental status  . Levofloxacin Other (See Comments)    LOSS OF CONSCIOUSNESS  . Nsaids Other (See Comments)    Patient  instructed not to take NSAID's after his lung transplant  . Tolmetin      Home Medications  Prior to Admission medications   Medication Sig Start Date End Date Taking? Authorizing Provider  ACCU-CHEK FASTCLIX LANCETS MISC "As directed up to 4 times daily" 12/16/15   [provider]  acetaminophen (TYLENOL) 160 MG/5ML suspension Place 649.6 mg into feeding tube every 6 (six) hours as needed for mild pain. 09/15/20   [provider]  albuterol (VENTOLIN HFA) 108 (90 Base) MCG/ACT inhaler Inhale 1-2 puffs into the lungs daily as needed  for wheezing or shortness of breath. 05/12/20   [provider]  aspirin 81 MG tablet Take 81 mg by mouth daily with breakfast.    [provider]  CYCLOSPORINE PO Take 125 mg by mouth every 12 (twelve) hours.    [provider]  dorzolamidel-timolol (COSOPT PF) 22.3-6.8 MG/ML SOLN ophthalmic solution Place 1 drop into both eyes 2 (two) times daily.    [provider]  fluticasone (FLONASE) 50 MCG/ACT nasal spray Place 2 sprays into the nose daily. 03/05/13   Wenda Low, MD  insulin regular (NOVOLIN R) 100 units/mL injection Inject into the skin See admin instructions. Inject per sliding scale: if 0-199=0; 200-250=1; 251-300=2; 301-350=3; 351-400=4 and call MD, subcutaneously four times a day. Give in addition to schedule amount.    [provider]  Loperamide HCl 1 MG/7.5ML LIQD Take 15 mLs (2 mg total) by G tube 3 (three) times daily as needed for Diarrhea 09/15/20   [provider]  midodrine (PROAMATINE) 10 MG tablet Place 10 mg into feeding tube daily as needed (for hypotension prior to PT/OT PRN for BP).    [provider]  midodrine (PROAMATINE) 10 MG tablet Place 10 mg into feeding tube 2 (two) times daily. Give before dialysis    [provider]  Multiple Vitamin (MULTIVITAMIN WITH MINERALS) TABS tablet Place 1 tablet into feeding tube daily. Centrum for Men 50 plus: on  Monday, Wednesday, Friday (dialysis days): take one tablet daily with breakfast; on Sunday, Tuesday, Thursday, Saturday: take one tablet daily with supper    [provider]  Nutritional Supplements (FEEDING SUPPLEMENT, NEPRO CARB STEADY,) LIQD Place 1,000 mLs into feeding tube daily. 09/28/20   Fritzi Mandes, MD  omeprazole (FIRST-OMEPRAZOLE) 2 mg/mL SUSP oral suspension Place 40 mg into feeding tube daily.    [provider]  ondansetron (ZOFRAN) 4 MG tablet Place 4 mg into feeding tube every 6 (six) hours as needed for nausea or vomiting.    [provider]  oxyCODONE (ROXICODONE) 5 MG immediate release tablet Take 1 tablet (5 mg total) by mouth every 4 (four) hours as needed for severe pain. 10/11/20   Tacy Learn, PA-C  pravastatin (PRAVACHOL) 10 MG tablet Place 10 mg into feeding tube at bedtime. 03/16/20   [provider]  predniSONE (DELTASONE) 5 MG tablet Take 1 tablet (5 mg total) by G tube once daily . Crush and administer via GTube daily. 09/16/20 09/16/21  [provider]  sulfamethoxazole-trimethoprim (BACTRIM,SEPTRA) 400-80 MG tablet Place 1 tablet into feeding tube See admin instructions. On Monday, Wednesday, Friday (dialysis days): take one tablet by mouth daily with supper 02/27/17   [provider]  valganciclovir (VALCYTE) 50 MG/ML SOLR Take 450 mg by mouth See admin instructions. Take one tablet (450 mg) by mouth on Monday, Wednesday, and Friday after dialysis 03/26/15   [provider]  vitamin B-6 (PYRIDOXINE) 25 MG tablet Place 25 mg into feeding tube daily with breakfast.    [provider]     Critical care time: 45 minutes      Baltazar Apo, MD, PhD 10/08/2020, 5:33 PM [facility] [phone] or if no answer before 7:00PM call [phone] For any issues after 7:00PM please call eLink [phone]

## 2020-10-28 NOTE — Progress Notes (Signed)
PCCM Progress Note   In combination with further chart review and discussion with patient it appears that given multiple comorbidities with recent diagnosis of metastatic cancer patient has endorsed a desire for hospice/comfort care.  I communicated with patient regarding current wishes and he did confirm that he would desire a comfort approach.  He states "I understand this is likely the end of my life and I want to be made comfortable".  Shortly after this discussion I called patient spouse [name] who also confirmed the patient desires hospice/comfort measures.  She is obviously overwhelmed at the idea that [name] is critically ill and is likely to not survive this hospitalization.  Patient's wife and son are in route to the hospital to discuss further wishes.  At this time we will support patient with IV hydration and ensure he is comfortable.   Orders for DNR placed.   [name], NP-C [facility] Personal contact information can be found on Amion  10/15/2020, 5:29 PM

## 2020-10-28 NOTE — Plan of Care (Signed)

## 2020-10-28 NOTE — ED Notes (Signed)
MD notified about pts BP

## 2020-10-28 NOTE — ED Notes (Signed)
RN attempted report x1.  

## 2020-10-28 NOTE — Progress Notes (Signed)
PCCM progress note   Patient's family presented to ED for further goals of care discussion. Patients Wife, Son, Daughter-in law, and grandson at bedside. The patient is still able to [facility] and able to contribute to the conversation. [name] again stated, with family present, that he wishes to solely focus on things that bring him comfort. Argie does not want any further hemodialysis, antibiotics, further blood draws, blood products, vasopressor support or any other forms of critical care.   I have placed orders for comfort care and changed his admission bed status palliative. Palliative care has also been consulted.   [name], NP-C [facility] Personal contact information can be found on Amion  10/23/2020, 6:47 PM

## 2020-10-29 ENCOUNTER — Other Ambulatory Visit (HOSPITAL_COMMUNITY): Payer: Self-pay

## 2020-10-29 DIAGNOSIS — L8990 Pressure ulcer of unspecified site, unspecified stage: Secondary | ICD-10-CM | POA: Insufficient documentation

## 2020-10-29 DIAGNOSIS — Z515 Encounter for palliative care: Secondary | ICD-10-CM | POA: Diagnosis not present

## 2020-10-29 LAB — C DIFFICILE QUICK SCREEN W PCR REFLEX
C Diff antigen: POSITIVE — AB
C Diff toxin: NEGATIVE

## 2020-10-29 LAB — CLOSTRIDIUM DIFFICILE BY PCR, REFLEXED: Toxigenic C. Difficile by PCR: POSITIVE — AB

## 2020-10-29 MED ORDER — FIDAXOMICIN 200 MG PO TABS
200.0000 mg | ORAL_TABLET | Freq: Two times a day (BID) | ORAL | Status: DC
  Filled 2020-10-29 (×4): qty 1

## 2020-10-29 NOTE — Progress Notes (Signed)
[facility]) Hospital RN Liaison  Received request from [facility], Magdalen Spatz, RN for family interest in [facility].   Chart reviewed and spoke with family to acknowledge referral.   Unfortunately [facility] is not able to offer a room today.   Family and Magdalen Spatz, RN [facility] aware hospital liaison will follow up tomorrow or sooner if room becomes available.   Please do not hesitate to call with any questions.  Thank you,   B. Loren Racer, RN [facility] Liaison [phone]

## 2020-10-29 NOTE — Progress Notes (Addendum)
PROGRESS NOTE    [name]  IOE:[id number] DOB: 21-Feb-1951 DOA: 10/19/2020 PCP: Wenda Low, MD  [phone]   Assessment & Plan:   Active Problems:   Sepsis ([place])   Pressure injury of skin   70 year old man with a history of right lung transplant (2016) on immunosuppression that includes prednisone, cyclosporine.  He is on Bactrim and valganciclovir prophylaxis.  He has end-stage renal disease on hemodialysis Monday Wednesday Friday.  Also diagnosed with a non-small cell lung cancer in his native lung post XRT at [facility], with a prolonged hospital course such that he underwent GJ tube placement for nutritional support.  Last seen in transplant clinic 10/20/2020.  CT chest 10/20/2020 at [facility] reviewed, showed interstitial disease in the native lung with a decrease in size of left upper lobe mass compared with priors, innumerable new/increased B pulmonary nodules including throughout the transplanted right lung, trace left pleural effusion, 9 mm soft tissue nodule in the left chest wall, new hepatic lesion   He reports progressive weakness, poor p.o. intake and inability to tolerate tube feeds, diarrhea for 3 to 4 days.  He went to hemodialysis 5/25, was found to have a temperature of 893 and systolic blood pressure in the 50s.  He was directed immediately to the emergency department.  IV fluid resuscitation initiated.  Show labs show lactate 2.0, hemoglobin 5.9 with a leukocytosis, chemistries are pending.  COVID19 and flu negative   Comfort care measures only  Severe Sepsis --Receiving IV fluid resuscitation currently -Empiric antibiotics > vanco + cefepime, d/c'ed since comfort care  C diff colitis, POA Hx of prior C diff coliltis --start oral vanc to treat for comfort  End-stage renal disease on hemodialysis.   Missed his HD on 5/25 due to hypotension.  --No more dialysis, per comfort care measures  Status post lung transplant on chronic immunosuppression. -on  cyclosporine, prednisone, Bactrim and valganciclovir --d/c all meds, per comfort care measures  Non-small cell lung cancer, now presumed stage IV based on CT chest from [facility] 10/20/2020  Hx HTN -hold all antihypertensives  Shoulder pain.  IV morphine, per comfort care measures  Nutrition --oral intake for comfort   DVT prophylaxis: None:Comfort Care Code Status: DNR  Family Communication: wife and family updated at bedside today  Level of care: Palliative Care Dispo:   The patient is from: home Anticipated d/c is to: hospice facility Anticipated d/c date is: whenever bed  Patient currently is not medically ready to d/c due to: need to be discharged to hospice facility   Subjective and Interval History:  Pt was made comfort care overnight by critical care provider, per pt's own expressed wishes.  Pt was calm and sedated after IV morphine.  Only small amount of loose stool output.    Wife and family updated in the room.  Hospice referral made.   Objective: Vitals:   10/11/2020 2100 10/09/2020 2115 10/05/2020 2126 10/29/2020 2202  BP: (!) 88/53 (!) 89/53 (!) 80/46 (!) 93/51  Pulse:   75 74  Resp: 15 12 19 17   Temp:    (!) 97.5 F (36.4 C)  TempSrc:    Oral  SpO2:   100% 100%   No intake or output data in the 24 hours ending 2020-11-06 0337 There were no vitals filed for this visit.  Examination:   Constitutional: NAD, lethargic CV: No cyanosis.   RESP: normal respiratory, effort, no distress SKIN: warm, dry   Data Reviewed: I have personally reviewed following labs and imaging  studies  CBC: Recent Labs  Lab 10/23/20 2000 10/11/2020 1450  WBC 14.6* 20.0*  NEUTROABS 11.3* 17.4*  HGB 7.9* 5.9*  HCT 25.2* 19.1*  MCV 102.9* 104.4*  PLT 134* 295*   Basic Metabolic Panel: Recent Labs  Lab 10/23/20 2000 10/29/2020 2221  NA 139 139  K 2.9* 3.0*  CL 99 99  CO2 31 25  GLUCOSE 102* 207*  BUN 10 33*  CREATININE 2.30* 5.59*  CALCIUM 7.3* 7.3*   GFR: Estimated  Creatinine Clearance: 10 mL/min (A) (by C-G formula based on SCr of 5.59 mg/dL (H)). Liver Function Tests: Recent Labs  Lab 10/15/2020 2221  AST 40  ALT 21  ALKPHOS 537*  BILITOT 2.8*  PROT 4.8*  ALBUMIN 1.4*   No results for input(s): LIPASE, AMYLASE in the last 168 hours. No results for input(s): AMMONIA in the last 168 hours. Coagulation Profile: No results for input(s): INR, PROTIME in the last 168 hours. Cardiac Enzymes: No results for input(s): CKTOTAL, CKMB, CKMBINDEX, TROPONINI in the last 168 hours. BNP (last 3 results) No results for input(s): PROBNP in the last 8760 hours. HbA1C: No results for input(s): HGBA1C in the last 72 hours. CBG: Recent Labs  Lab 10/31/2020 2213  GLUCAP 194*   Lipid Profile: No results for input(s): CHOL, HDL, LDLCALC, TRIG, CHOLHDL, LDLDIRECT in the last 72 hours. Thyroid Function Tests: No results for input(s): TSH, T4TOTAL, FREET4, T3FREE, THYROIDAB in the last 72 hours. Anemia Panel: No results for input(s): VITAMINB12, FOLATE, FERRITIN, TIBC, IRON, RETICCTPCT in the last 72 hours. Sepsis Labs: Recent Labs  Lab 10/08/2020 1450  LATICACIDVEN 2.0*    Recent Results (from the past 240 hour(s))  Resp Panel by RT-PCR (Flu A&B, Covid) Nasopharyngeal Swab     Status: None   Collection Time: 10/31/2020  3:30 PM   Specimen: Nasopharyngeal Swab; Nasopharyngeal(NP) swabs in vial transport medium  Result Value Ref Range Status   SARS Coronavirus 2 by RT PCR NEGATIVE NEGATIVE Final    Comment: (NOTE) SARS-CoV-2 target nucleic acids are NOT DETECTED.  The SARS-CoV-2 RNA is generally detectable in upper respiratory specimens during the acute phase of infection. The lowest concentration of SARS-CoV-2 viral copies this assay can detect is 138 copies/mL. A negative result does not preclude SARS-Cov-2 infection and should not be used as the sole basis for treatment or other patient management decisions. A negative result may occur with  improper  specimen collection/handling, submission of specimen other than nasopharyngeal swab, presence of viral mutation(s) within the areas targeted by this assay, and inadequate number of viral copies(<138 copies/mL). A negative result must be combined with clinical observations, patient history, and epidemiological information. The expected result is Negative.  Fact Sheet for Patients:  EntrepreneurPulse.com.au  Fact Sheet for Healthcare Providers:  IncredibleEmployment.be  This test is no t yet approved or cleared by the [place] FDA and  has been authorized for detection and/or diagnosis of SARS-CoV-2 by FDA under an Emergency Use Authorization (EUA). This EUA will remain  in effect (meaning this test can be used) for the duration of the COVID-19 declaration under Section 564(b)(1) of the Act, 21 U.S.C.section 360bbb-3(b)(1), unless the authorization is terminated  or revoked sooner.       Influenza A by PCR NEGATIVE NEGATIVE Final   Influenza B by PCR NEGATIVE NEGATIVE Final    Comment: (NOTE) The Xpert Xpress SARS-CoV-2/FLU/RSV plus assay is intended as an aid in the diagnosis of influenza from Nasopharyngeal swab specimens and should not be used  as a sole basis for treatment. Nasal washings and aspirates are unacceptable for Xpert Xpress SARS-CoV-2/FLU/RSV testing.  Fact Sheet for Patients: EntrepreneurPulse.com.au  Fact Sheet for Healthcare Providers: IncredibleEmployment.be  This test is not yet approved or cleared by the [place] FDA and has been authorized for detection and/or diagnosis of SARS-CoV-2 by FDA under an Emergency Use Authorization (EUA). This EUA will remain in effect (meaning this test can be used) for the duration of the COVID-19 declaration under Section 564(b)(1) of the Act, 21 U.S.C. section 360bbb-3(b)(1), unless the authorization is terminated or revoked.  Performed at  [facility], [place] [address], [place], [place] [address]   C Difficile Quick Screen w PCR reflex     Status: Abnormal   Collection Time: 10/12/2020  4:20 PM   Specimen: STOOL  Result Value Ref Range Status   C Diff antigen POSITIVE (A) NEGATIVE Final   C Diff toxin NEGATIVE NEGATIVE Final   C Diff interpretation Results are indeterminate. See PCR results.  Final    Comment: Performed at [facility], [place] [address] [place], [place] [address]  C. Diff by PCR, Reflexed     Status: Abnormal   Collection Time: 10/24/2020  4:20 PM  Result Value Ref Range Status   Toxigenic C. Difficile by PCR POSITIVE (A) NEGATIVE Final    Comment: Positive for toxigenic C. difficile with little to no toxin production. Only treat if clinical presentation suggests symptomatic illness. Performed at [facility], [place] [address], [place], [place] [address]       Radiology Studies: DG Chest [place] 1 View  Result Date: 10/25/2020 CLINICAL DATA:  70 year old male with fever. EXAM: PORTABLE CHEST 1 VIEW COMPARISON:  Chest radiograph dated 10/14/2020. FINDINGS: Diffuse pulmonary opacity involving the left lung similar to prior radiograph. Minimal right lung base atelectasis and faint streaky density in the right mid lung field, and change. Small left pleural effusion or pleural thickening. No pneumothorax. Stable cardiomediastinal silhouette. Atherosclerotic calcification of the aorta. Vascular stent noted in the upper mediastinum and left axilla. No acute osseous pathology. IMPRESSION: No significant interval change in the appearance of the lungs compared to the prior radiograph. Electronically Signed   By: Anner Crete M.D.   On: 10/21/2020 16:07     Scheduled Meds: . fidaxomicin  200 mg Oral BID   Continuous Infusions:   LOS: 2 days     Enzo Bi, MD [facility] If 7PM-7AM, please contact night-coverage 2020-11-05, 3:37 AM

## 2020-10-29 NOTE — Progress Notes (Signed)
Nutrition Brief Note  Chart reviewed. MST Score 2 or greater.  Pt now comfort care only with plans to discharge to residential hospice.  Regular diet ordered No further nutrition interventions planned at this time.  Please re-consult as needed.   Kerman Passey MS, RDN, LDN, CNSC Registered Dietitian III Clinical Nutrition RD Pager and On-Call Pager Number Located in [place]

## 2020-10-29 NOTE — Care Management (Addendum)
Consulted via secure chat for residential hospice. Spoke to patient at bedside, recently medicated. Will call family.   Spoke to wife family will be at bedside in 87 minutes.  Magdalen Spatz RN

## 2020-10-29 NOTE — TOC Initial Note (Addendum)
Transition of Care [facility]) - Initial/Assessment Note    Patient Details  Name: [name] MRN: [id number] Date of Birth: 1951/04/12  Transition of Care [facility]) CM/SW Contact:    Marilu Favre, RN Phone Number: 10/29/2020, 2:55 PM  Clinical Narrative:                 Spoke to patient's wife [name] and visitors at bedside. Discussed residential hospice and offered choice. [name] prefers [employer].   Referral made to [facility] with  Lonia Chimera for [facility]   Patient had an appointment with palliative care through [facility] for next Tuesday 11/03/20  Expected Discharge Plan: [place]     Patient Goals and CMS Choice     Choice offered to / list presented to : Spouse  Expected Discharge Plan and Services Expected Discharge Plan: [place]     Post Acute Care Choice: Hospice Living arrangements for the past 2 months: Single Family Home                 DME Arranged: N/A         HH Arranged: NA          Prior Living Arrangements/Services Living arrangements for the past 2 months: Single Family Home Lives with:: Spouse                   Activities of Daily Living Home Assistive Devices/Equipment: Grab bars around toilet,Grab bars in shower,Eyeglasses,Oxygen,Reacher,Wheelchair,Walker (specify type) ADL Screening (condition at time of admission) Patient's cognitive ability adequate to safely complete daily activities?: Yes Is the patient deaf or have difficulty hearing?: No Does the patient have difficulty seeing, even when wearing glasses/contacts?: No Does the patient have difficulty concentrating, remembering, or making decisions?: No Patient able to express need for assistance with ADLs?: Yes Does the patient have difficulty dressing or bathing?: Yes Independently performs ADLs?: No Communication: Independent Dressing (OT): Needs assistance Is this a change from baseline?: Pre-admission baseline Grooming:  Needs assistance Is this a change from baseline?: Pre-admission baseline Feeding: Independent Bathing: Needs assistance Is this a change from baseline?: Pre-admission baseline Toileting: Needs assistance Is this a change from baseline?: Pre-admission baseline In/Out Bed: Dependent Is this a change from baseline?: Pre-admission baseline Walks in Home: Dependent Is this a change from baseline?: Pre-admission baseline Does the patient have difficulty walking or climbing stairs?: Yes Weakness of Legs: Both Weakness of Arms/Hands: Both  Permission Sought/Granted      Share Information with NAME: [name] wife           Emotional Assessment              Admission diagnosis:  Sepsis ([place]) [A41.9] Hypotension, unspecified hypotension type [I95.9] Anemia, unspecified type [D64.9] Patient Active Problem List   Diagnosis Date Noted  . Pressure injury of skin 10/29/2020  . Anemia 09/26/2020  . Exercise hypoxemia   . Pneumonia 07/17/2020  . Lung mass 07/17/2020  . Acute bronchiolitis due to human metapneumovirus 04/25/2020  . Sepsis with acute organ dysfunction ([place]) 01/08/2020  . Lactic acidosis 01/08/2020  . Mixed diabetic hyperlipidemia associated with type 2 diabetes mellitus ([place]) 01/08/2020  . Hepatic cirrhosis ([place]) 01/08/2020  . Uncontrolled type 2 diabetes mellitus with hyperglycemia, with long-term current use of insulin ([place]) 01/08/2020  . IV infiltrate, initial encounter 01/08/2020  . Acute diarrhea 01/08/2020  . Acute metabolic encephalopathy 32/99/2426  . 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 AV fistula infection, initial encounter ([place]) 01/14/2019  .  Immunocompromised state ([place]) 01/14/2019  . Wound infection 01/13/2019  . Sepsis ([place])   . CKD (chronic kidney disease)   . Generalized weakness 12/30/2015  . ESRD (end stage renal disease) ([place])   . 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[place])   . Acute respiratory failure with hypoxemia ([place]) 04/18/2015  . Septic  shock ([place]) 04/18/2015  . Acute encephalopathy 04/18/2015  . Acute respiratory failure with hypoxia ([place]) 04/18/2015  . Cardiac arrest ([place])   . HCAP (healthcare-associated pneumonia)   . Elevated rheumatoid factor 05/09/2014  . Essential hypertension 05/09/2014  . ILD (interstitial lung disease) ([place]) 05/09/2014  . Obstructive apnea 05/09/2014  . Lung nodule, solitary 04/18/2014  . Awaiting organ transplant 04/18/2014  . Acute on chronic respiratory failure with hypoxia ([place]) 08/15/2013  . Edema 07/05/2013  . Chronic respiratory failure ([place]) 03/03/2013  . Diabetes mellitus with complication ([place]) 16/03/9603  . Acute sinusitis 05/04/2011  . Cough 11/10/2010  . 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 Obstructive sleep apnea 10/09/2008  . GERD without esophagitis 10/09/2008  . HIATAL HERNIA 10/09/2008  . OSTEOARTHRITIS 10/09/2008   PCP:  Wenda Low, MD Pharmacy:   [place], [place] - [place] [address] Phone: [phone] Fax: [phone]  [facility] [phone] [place], [place] [address] Phone: [phone] Fax: [place], [place] - [place] [address] [place] [address] Phone: [phone] Fax: [phone]  [place], [place] [address] Phone: [phone] Fax: [phone]     Social Determinants of Health (SDOH) Interventions    Readmission Risk Interventions Readmission Risk Prevention Plan 10/15/2020  Transportation Screening Complete  Medication Review (RN Care Manager) Complete  PCP or Specialist appointment within 3-5 days of discharge Complete  HRI or Home Care Consult Complete  SW Recovery Care/Counseling  Consult Complete  Palliative Care Screening Not Applicable  Skilled Nursing Facility Complete  Some recent data might be hidden

## 2020-10-29 NOTE — Consult Note (Signed)
   [facility] Inpatient Consult   10/29/2020  [name] 1950-10-18 [id number]  [facility] [ACO] Patient: Medicare CMS DCE   Patient screened for less than 30 days readmission hospitalization with noted extreme high risk score for unplanned readmission risk. .  Review of patient's medical record reveals patient is being recommended for hospice care.  Plan: Will sign off at transition of care is finalized.  For questions contact:   Natividad Brood, RN BSN [facility] Liaison  [phone] business mobile phone Toll free office [phone]  Fax number: [phone] [email or web address]

## 2020-10-30 DIAGNOSIS — A0472 Enterocolitis due to Clostridium difficile, not specified as recurrent: Secondary | ICD-10-CM

## 2020-10-30 DIAGNOSIS — Z789 Other specified health status: Secondary | ICD-10-CM | POA: Diagnosis present

## 2020-10-30 DIAGNOSIS — D509 Iron deficiency anemia, unspecified: Secondary | ICD-10-CM | POA: Diagnosis present

## 2020-10-30 DIAGNOSIS — I70219 Atherosclerosis of native arteries of extremities with intermittent claudication, unspecified extremity: Secondary | ICD-10-CM | POA: Diagnosis present

## 2020-10-30 DIAGNOSIS — E440 Moderate protein-calorie malnutrition: Secondary | ICD-10-CM | POA: Diagnosis present

## 2020-10-30 DIAGNOSIS — Z7952 Long term (current) use of systemic steroids: Secondary | ICD-10-CM

## 2020-10-30 DIAGNOSIS — N2581 Secondary hyperparathyroidism of renal origin: Secondary | ICD-10-CM | POA: Diagnosis present

## 2020-10-30 DIAGNOSIS — C799 Secondary malignant neoplasm of unspecified site: Secondary | ICD-10-CM | POA: Diagnosis present

## 2020-10-30 DIAGNOSIS — C3490 Malignant neoplasm of unspecified part of unspecified bronchus or lung: Secondary | ICD-10-CM | POA: Diagnosis present

## 2020-10-30 DIAGNOSIS — Z515 Encounter for palliative care: Secondary | ICD-10-CM

## 2020-10-30 MED ORDER — VANCOMYCIN HCL 250 MG PO CAPS: 250.0000 mg | ORAL_CAPSULE | Freq: Four times a day (QID) | ORAL | 0 refills | Status: AC

## 2020-11-03 ENCOUNTER — Other Ambulatory Visit: Payer: Self-pay | Admitting: Student

## 2020-11-04 NOTE — TOC Progression Note (Addendum)
Transition of Care [facility]) - Progression Note    Patient Details  Name: MARVION BASTIDAS MRN: [id number] Date of Birth: 1950/10/28  Transition of Care [facility]) CM/SW Contact  Jacalyn Lefevre Edson Snowball, RN Phone Number: 2020-11-18, 10:58 AM  Clinical Narrative:     Patient has bed at [facility] today. Family doing consents, once completed Bobbie  at [facility] have been signed PTAR called .   [place] cancelled  Expected Discharge Plan: [place]    Expected Discharge Plan and Services Expected Discharge Plan: [place]     Post Acute Care Choice: Hospice Living arrangements for the past 2 months: Single Family Home                 DME Arranged: N/A         HH Arranged: NA           Social Determinants of Health (SDOH) Interventions    Readmission Risk Interventions Readmission Risk Prevention Plan 10/15/2020  Transportation Screening Complete  Medication Review Press photographer) Complete  PCP or Specialist appointment within 3-5 days of discharge Complete  HRI or [place] Complete  SW Recovery Care/Counseling Consult Complete  Palliative Care Screening Not [place] Complete  Some recent data might be hidden

## 2020-11-04 NOTE — Progress Notes (Signed)
   Palliative Medicine Inpatient Follow Up Note  Spoke this morning to Dr. Billie Ruddy and Magdalen Spatz, CM via secure chat. Patient has been referred to [facility]. Comprehensive discussions regarding end of life care have already taken place.   Palliative care will remove the consult at this time. Please do not hesitate to contact PMT for any additional needs.   ______________________________________________________________________________________ [place] Team Team Cell Phone: [phone] Please utilize secure chat with additional questions, if there is no response within 30 minutes please call the above phone number  Palliative Medicine Team providers are available by phone from 7am to 7pm daily and can be reached through the team cell phone.  Should this patient require assistance outside of these hours, please call the patient's attending physician.

## 2020-11-04 NOTE — Discharge Summary (Addendum)
Death Summary  KEVAL NAM SWN:[id number] DOB: [birth year] DOA: 11-07-2020  PCP: Wenda Low, MD  Admit date: 07-Nov-2020 Date of Death: Nov 09, 2020 Time of Death: 13:15   History of present illness:  70 year old man with a history of right lung transplant (2016) on immunosuppression that includes prednisone, cyclosporine.  He is on Bactrim and valganciclovir prophylaxis.  He has end-stage renal disease on hemodialysis Monday Wednesday Friday.  Also diagnosed with a non-small cell lung cancer in his native lung post XRT at [facility], with a prolonged hospital course such that he underwent GJ tube placement for nutritional support.  Last seen in transplant clinic 10/20/2020.   CT chest 10/20/2020 at [facility] reviewed, showed interstitial disease in the native lung with a decrease in size of left upper lobe mass compared with priors, innumerable new/increased B pulmonary nodules including throughout the transplanted right lung, trace left pleural effusion, 9 mm soft tissue nodule in the left chest wall, new hepatic lesion    He reports progressive weakness, poor p.o. intake and inability to tolerate tube feeds, diarrhea for 3 to 4 days.  He went to hemodialysis 08-Nov-2022, was found to have a temperature of 938 and systolic blood pressure in the 50s.  He was directed immediately to the emergency department.  IV fluid resuscitation initiated.  Show labs show lactate 2.0, hemoglobin 5.9 with a leukocytosis, chemistries are pending.   COVID19 and flu negative  Pt was admitted to PCCM.  Per discussion with critical care provider, pt decided to stop all treatment and transition to comfort care on 11/08/2022.     Comfort care measures only   Septic shock and hypovolemic shock --s/p IV fluid resuscitation  -Empiric antibiotics > vanco + cefepime, d/c'ed since comfort care   C diff colitis, POA Hx of prior C diff coliltis --start oral vanc to treat for comfort   End-stage renal disease on hemodialysis.   Missed  his HD on 2022/11/08 due to hypotension.  --No more dialysis, per comfort care measures   Status post lung transplant on chronic immunosuppression. -on cyclosporine, prednisone, Bactrim and valganciclovir --d/c all meds, per comfort care measures   Non-small cell lung cancer, now presumed stage IV based on CT chest from [facility] 10/20/2020   Hx HTN -hold all antihypertensives   Shoulder pain.  IV morphine, per comfort care measures   Nutrition --oral intake for comfort   Pressure Injury 11/07/20 Perineum Right Stage 2 , POA     Discharge Diagnoses:  Active Problems:   Obstructive sleep apnea   Pulmonary fibrosis, postinflammatory (HCC)   GERD without esophagitis   Chronic respiratory failure (HCC)   Diabetes mellitus with complication (HCC)   Acute on chronic respiratory failure with hypoxia (HCC)   ILD (interstitial lung disease) (HCC)   Obstructive apnea   ESRD (end stage renal disease) (HCC)   Generalized weakness   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[place])   Sepsis ([place])   Immunocompromised state ([place])   Severe sepsis with septic shock (HCC)   Lactic acidosis   Hepatic cirrhosis (HCC)   Lung mass   Acute on chronic anemia   Pressure injury of skin   C. difficile colitis   Non-small cell lung cancer ([place])   On tube feeding diet   Comfort measures only status   Metastasis (HCC)   Iron deficiency anemia   Moderate protein-calorie malnutrition (HCC)   Atherosclerosis of native artery of extremity with intermittent claudication (HCC)   Secondary hyperparathyroidism of renal origin ([place])   Current chronic use  of systemic steroids    The results of significant diagnostics from this hospitalization (including imaging, microbiology, ancillary and laboratory) are listed below for reference.    Significant Diagnostic Studies: CT Head Wo Contrast  Result Date: 10/12/2020 CLINICAL DATA:  Weakness elevated blood pressure EXAM: CT HEAD WITHOUT CONTRAST TECHNIQUE: Contiguous axial images were  obtained from the base of the skull through the vertex without intravenous contrast. COMPARISON:  CT brain 04/24/2020, 02/05/2019 FINDINGS: Brain: No acute territorial infarction, hemorrhage or intracranial mass. Chronic lacunar infarct or prominent perivascular space in the right subinsular region without change. Stable ventricle size. Vascular: No hyperdense vessels.  Carotid vascular calcification Skull: Normal. Negative for fracture or focal lesion. Sinuses/Orbits: Patchy mucosal thickening in the sinuses Other: None IMPRESSION: No CT evidence for acute intracranial abnormality. Electronically Signed   By: Donavan Foil M.D.   On: 10/12/2020 21:51   DG Chest Port 1 View  Result Date: 10/09/2020 CLINICAL DATA:  70 year old male with fever. EXAM: PORTABLE CHEST 1 VIEW COMPARISON:  Chest radiograph dated 10/14/2020. FINDINGS: Diffuse pulmonary opacity involving the left lung similar to prior radiograph. Minimal right lung base atelectasis and faint streaky density in the right mid lung field, and change. Small left pleural effusion or pleural thickening. No pneumothorax. Stable cardiomediastinal silhouette. Atherosclerotic calcification of the aorta. Vascular stent noted in the upper mediastinum and left axilla. No acute osseous pathology. IMPRESSION: No significant interval change in the appearance of the lungs compared to the prior radiograph. Electronically Signed   By: Anner Crete M.D.   On: 11/03/2020 16:07   DG Chest Port 1 View  Result Date: 10/14/2020 CLINICAL DATA:  Shortness of breath EXAM: PORTABLE CHEST 1 VIEW COMPARISON:  10/12/2020 FINDINGS: Postoperative changes. Heart is normal size. Aortic calcifications. Chronic fibrotic changes throughout the left lung again noted, stable. Scarring in the right lung base. No change since prior study. IMPRESSION: No interval change. Electronically Signed   By: Rolm Baptise M.D.   On: 10/14/2020 08:24   DG Chest Portable 1 View  Result Date:  10/12/2020 CLINICAL DATA:  Hypotension following dialysis EXAM: PORTABLE CHEST 1 VIEW COMPARISON:  09/27/2020 FINDINGS: Cardiac shadow is stable. Aortic calcifications are noted. Vascular stents are noted in the region of the left shoulder and left innominate vein stable from the prior exam. Previously seen tunneled PICC line on the right has been removed. Postsurgical changes are noted. Chronic fibrotic changes in the left lung are again noted. No acute infiltrate is seen. IMPRESSION: Chronic changes without acute abnormality. Electronically Signed   By: Inez Catalina M.D.   On: 10/12/2020 21:03    Microbiology: Recent Results (from the past 240 hour(s))  Resp Panel by RT-PCR (Flu A&B, Covid) Nasopharyngeal Swab     Status: None   Collection Time: 10/24/2020  3:30 PM   Specimen: Nasopharyngeal Swab; Nasopharyngeal(NP) swabs in vial transport medium  Result Value Ref Range Status   SARS Coronavirus 2 by RT PCR NEGATIVE NEGATIVE Final    Comment: (NOTE) SARS-CoV-2 target nucleic acids are NOT DETECTED.  The SARS-CoV-2 RNA is generally detectable in upper respiratory specimens during the acute phase of infection. The lowest concentration of SARS-CoV-2 viral copies this assay can detect is 138 copies/mL. A negative result does not preclude SARS-Cov-2 infection and should not be used as the sole basis for treatment or other patient management decisions. A negative result may occur with  improper specimen collection/handling, submission of specimen other than nasopharyngeal swab, presence of viral mutation(s) within the  areas targeted by this assay, and inadequate number of viral copies(<138 copies/mL). A negative result must be combined with clinical observations, patient history, and epidemiological information. The expected result is Negative.  Fact Sheet for Patients:  EntrepreneurPulse.com.au  Fact Sheet for Healthcare Providers:   IncredibleEmployment.be  This test is no t yet approved or cleared by the [place] FDA and  has been authorized for detection and/or diagnosis of SARS-CoV-2 by FDA under an Emergency Use Authorization (EUA). This EUA will remain  in effect (meaning this test can be used) for the duration of the COVID-19 declaration under Section 564(b)(1) of the Act, 21 U.S.C.section 360bbb-3(b)(1), unless the authorization is terminated  or revoked sooner.       Influenza A by PCR NEGATIVE NEGATIVE Final   Influenza B by PCR NEGATIVE NEGATIVE Final    Comment: (NOTE) The Xpert Xpress SARS-CoV-2/FLU/RSV plus assay is intended as an aid in the diagnosis of influenza from Nasopharyngeal swab specimens and should not be used as a sole basis for treatment. Nasal washings and aspirates are unacceptable for Xpert Xpress SARS-CoV-2/FLU/RSV testing.  Fact Sheet for Patients: EntrepreneurPulse.com.au  Fact Sheet for Healthcare Providers: IncredibleEmployment.be  This test is not yet approved or cleared by the [place] FDA and has been authorized for detection and/or diagnosis of SARS-CoV-2 by FDA under an Emergency Use Authorization (EUA). This EUA will remain in effect (meaning this test can be used) for the duration of the COVID-19 declaration under Section 564(b)(1) of the Act, 21 U.S.C. section 360bbb-3(b)(1), unless the authorization is terminated or revoked.  Performed at [facility], [place] [address] [place], [place] [address]   C Difficile Quick Screen w PCR reflex     Status: Abnormal   Collection Time: 10/15/2020  4:20 PM   Specimen: STOOL  Result Value Ref Range Status   C Diff antigen POSITIVE (A) NEGATIVE Final   C Diff toxin NEGATIVE NEGATIVE Final   C Diff interpretation Results are indeterminate. See PCR results.  Final    Comment: Performed at [facility], [place] [address] [place], [place] [address]  C.  Diff by PCR, Reflexed     Status: Abnormal   Collection Time: 10/22/2020  4:20 PM  Result Value Ref Range Status   Toxigenic C. Difficile by PCR POSITIVE (A) NEGATIVE Final    Comment: Positive for toxigenic C. difficile with little to no toxin production. Only treat if clinical presentation suggests symptomatic illness. Performed at [facility], [place] [address] [place], [place] [address]      Labs: Basic Metabolic Panel: Recent Labs  Lab 10/23/20 2000 10/25/2020 2221  NA 139 139  K 2.9* 3.0*  CL 99 99  CO2 31 25  GLUCOSE 102* 207*  BUN 10 33*  CREATININE 2.30* 5.59*  CALCIUM 7.3* 7.3*   Liver Function Tests: Recent Labs  Lab 10/07/2020 2221  AST 40  ALT 21  ALKPHOS 537*  BILITOT 2.8*  PROT 4.8*  ALBUMIN 1.4*   No results for input(s): LIPASE, AMYLASE in the last 168 hours. No results for input(s): AMMONIA in the last 168 hours. CBC: Recent Labs  Lab 10/23/20 2000 10/21/2020 1450  WBC 14.6* 20.0*  NEUTROABS 11.3* 17.4*  HGB 7.9* 5.9*  HCT 25.2* 19.1*  MCV 102.9* 104.4*  PLT 134* 131*   Cardiac Enzymes: No results for input(s): CKTOTAL, CKMB, CKMBINDEX, TROPONINI in the last 168 hours. D-Dimer No results for input(s): DDIMER in the last 72 hours. BNP:  Invalid input(s): POCBNP CBG: Recent Labs  Lab 10/05/2020 2213  GLUCAP 194*   Anemia work up No results for input(s): VITAMINB12, FOLATE, FERRITIN, TIBC, IRON, RETICCTPCT in the last 72 hours. Urinalysis    Component Value Date/Time   COLORURINE AMBER (A) 12/31/2015 0515   APPEARANCEUR CLOUDY (A) 12/31/2015 0515   LABSPEC 1.016 12/31/2015 0515   PHURINE 7.0 12/31/2015 0515   GLUCOSEU NEGATIVE 12/31/2015 0515   HGBUR NEGATIVE 12/31/2015 0515   BILIRUBINUR SMALL (A) 12/31/2015 0515   KETONESUR 15 (A) 12/31/2015 0515   PROTEINUR 100 (A) 12/31/2015 0515   UROBILINOGEN 2.0 (H) 08/15/2013 1916   NITRITE NEGATIVE 12/31/2015 0515   LEUKOCYTESUR NEGATIVE 12/31/2015 0515   Sepsis Labs Invalid input(s):  PROCALCITONIN,  WBC,  LACTICIDVEN     SIGNED:  Enzo Bi, MD  [facility] November 09, 2020, 2:10 PM Pager   If 7PM-7AM, please contact night-coverage [email or web address] Password [email or web address]

## 2020-11-04 NOTE — Progress Notes (Addendum)
[facility] Hospital RN Liaison  There is a bed available at [facility] for pt. Consents are to be signed approx 11:30-11:45 am today.   Once consents completed will notify team so that transport via [place] can be arranged.   RN please call report to [facility] at [phone].  Addendum 11:52 am Consents Completed. TOC notified to arrange transport.  Please let me know if you have any questions.   Thank you for the opportunity to participate in the care of this patient.   Renea Ee, RN [facility] Liaison [phone]

## 2020-11-04 NOTE — Progress Notes (Signed)
Patient passed at approximately 1315. He was accompanied by his spouse, Molli Posey, his son with his significant other, his grandson, and his pastor. Death confirmed by Otila Kluver (primary RN) and Remo Lipps (charge nurse). Family offered comfort and flowsheet completed.

## 2020-11-04 DEATH — deceased
